# Patient Record
Sex: Female | Born: 1945 | Race: White | Hispanic: No | Smoking: Current every day smoker
Health system: Southern US, Community
[De-identification: ages and names within clinical notes are randomized; demographics above are authoritative.]

## PROBLEM LIST (undated history)

## (undated) DIAGNOSIS — E119 Type 2 diabetes mellitus without complications: Secondary | ICD-10-CM

## (undated) DIAGNOSIS — B977 Papillomavirus as the cause of diseases classified elsewhere: Secondary | ICD-10-CM

## (undated) DIAGNOSIS — E785 Hyperlipidemia, unspecified: Secondary | ICD-10-CM

## (undated) DIAGNOSIS — I739 Peripheral vascular disease, unspecified: Secondary | ICD-10-CM

## (undated) DIAGNOSIS — E1169 Type 2 diabetes mellitus with other specified complication: Secondary | ICD-10-CM

## (undated) DIAGNOSIS — J449 Chronic obstructive pulmonary disease, unspecified: Secondary | ICD-10-CM

## (undated) DIAGNOSIS — K635 Polyp of colon: Secondary | ICD-10-CM

## (undated) DIAGNOSIS — M5106 Intervertebral disc disorders with myelopathy, lumbar region: Secondary | ICD-10-CM

## (undated) DIAGNOSIS — F419 Anxiety disorder, unspecified: Secondary | ICD-10-CM

## (undated) DIAGNOSIS — Z794 Long term (current) use of insulin: Secondary | ICD-10-CM

## (undated) DIAGNOSIS — M797 Fibromyalgia: Secondary | ICD-10-CM

## (undated) DIAGNOSIS — Z85528 Personal history of other malignant neoplasm of kidney: Secondary | ICD-10-CM

## (undated) DIAGNOSIS — K219 Gastro-esophageal reflux disease without esophagitis: Secondary | ICD-10-CM

## (undated) DIAGNOSIS — I10 Essential (primary) hypertension: Secondary | ICD-10-CM

## (undated) DIAGNOSIS — D649 Anemia, unspecified: Secondary | ICD-10-CM

## (undated) DIAGNOSIS — G709 Myoneural disorder, unspecified: Secondary | ICD-10-CM

## (undated) DIAGNOSIS — Z8673 Personal history of transient ischemic attack (TIA), and cerebral infarction without residual deficits: Secondary | ICD-10-CM

## (undated) DIAGNOSIS — R42 Dizziness and giddiness: Secondary | ICD-10-CM

## (undated) DIAGNOSIS — R51 Headache: Secondary | ICD-10-CM

## (undated) DIAGNOSIS — F329 Major depressive disorder, single episode, unspecified: Secondary | ICD-10-CM

## (undated) DIAGNOSIS — I639 Cerebral infarction, unspecified: Secondary | ICD-10-CM

## (undated) DIAGNOSIS — W19XXXA Unspecified fall, initial encounter: Secondary | ICD-10-CM

## (undated) DIAGNOSIS — N289 Disorder of kidney and ureter, unspecified: Secondary | ICD-10-CM

## (undated) DIAGNOSIS — Y92009 Unspecified place in unspecified non-institutional (private) residence as the place of occurrence of the external cause: Secondary | ICD-10-CM

## (undated) DIAGNOSIS — M1990 Unspecified osteoarthritis, unspecified site: Secondary | ICD-10-CM

## (undated) DIAGNOSIS — F32A Depression, unspecified: Secondary | ICD-10-CM

## (undated) DIAGNOSIS — E039 Hypothyroidism, unspecified: Secondary | ICD-10-CM

## (undated) DIAGNOSIS — I2510 Atherosclerotic heart disease of native coronary artery without angina pectoris: Secondary | ICD-10-CM

## (undated) HISTORY — PX: ABDOMINAL HYSTERECTOMY: SHX81

## (undated) HISTORY — DX: Depression, unspecified: F32.A

## (undated) HISTORY — PX: LUNG SURGERY: SHX703

## (undated) HISTORY — PX: CHOLECYSTECTOMY OPEN: SUR202

## (undated) HISTORY — PX: APPENDECTOMY: SHX54

## (undated) HISTORY — PX: BLADDER SUSPENSION: SHX72

## (undated) HISTORY — DX: Anxiety disorder, unspecified: F41.9

## (undated) HISTORY — PX: FOOT SURGERY: SHX648

## (undated) HISTORY — PX: SHOULDER ARTHROSCOPY: SHX128

## (undated) HISTORY — PX: KNEE ARTHROSCOPY: SHX127

## (undated) HISTORY — DX: Hyperlipidemia, unspecified: E78.5

## (undated) HISTORY — PX: CARDIAC CATHETERIZATION: SHX172

## (undated) HISTORY — PX: TUBAL LIGATION: SHX77

## (undated) HISTORY — DX: Polyp of colon: K63.5

## (undated) HISTORY — DX: Major depressive disorder, single episode, unspecified: F32.9

## (undated) HISTORY — DX: Type 2 diabetes mellitus with other specified complication: E11.69

## (undated) HISTORY — DX: Unspecified osteoarthritis, unspecified site: M19.90

## (undated) HISTORY — DX: Unspecified place in unspecified non-institutional (private) residence as the place of occurrence of the external cause: Y92.009

## (undated) HISTORY — DX: Unspecified fall, initial encounter: W19.XXXA

---

## 1987-02-24 DIAGNOSIS — E119 Type 2 diabetes mellitus without complications: Secondary | ICD-10-CM

## 1987-02-24 DIAGNOSIS — Z794 Long term (current) use of insulin: Secondary | ICD-10-CM

## 1987-02-24 HISTORY — DX: Type 2 diabetes mellitus without complications: E11.9

## 1987-02-24 HISTORY — DX: Type 2 diabetes mellitus without complications: Z79.4

## 1998-02-21 ENCOUNTER — Ambulatory Visit (HOSPITAL_COMMUNITY): Admission: RE | Admit: 1998-02-21 | Discharge: 1998-02-21 | Payer: Self-pay | Admitting: Orthopedic Surgery

## 1998-09-24 ENCOUNTER — Inpatient Hospital Stay (HOSPITAL_COMMUNITY): Admission: EM | Admit: 1998-09-24 | Discharge: 1998-10-04 | Payer: Self-pay | Admitting: Emergency Medicine

## 1998-09-25 ENCOUNTER — Encounter: Payer: Self-pay | Admitting: Cardiology

## 1998-09-30 ENCOUNTER — Encounter: Payer: Self-pay | Admitting: Thoracic Surgery

## 1998-10-01 ENCOUNTER — Encounter: Payer: Self-pay | Admitting: Thoracic Surgery

## 1998-10-02 ENCOUNTER — Encounter: Payer: Self-pay | Admitting: Thoracic Surgery

## 1998-10-03 ENCOUNTER — Encounter: Payer: Self-pay | Admitting: Thoracic Surgery

## 1998-12-23 ENCOUNTER — Encounter: Admission: RE | Admit: 1998-12-23 | Discharge: 1998-12-23 | Payer: Self-pay | Admitting: Thoracic Surgery

## 1998-12-23 ENCOUNTER — Encounter: Payer: Self-pay | Admitting: Thoracic Surgery

## 1999-02-26 ENCOUNTER — Encounter: Payer: Self-pay | Admitting: Thoracic Surgery

## 1999-02-26 ENCOUNTER — Encounter: Admission: RE | Admit: 1999-02-26 | Discharge: 1999-02-26 | Payer: Self-pay | Admitting: Thoracic Surgery

## 1999-06-11 ENCOUNTER — Ambulatory Visit (HOSPITAL_BASED_OUTPATIENT_CLINIC_OR_DEPARTMENT_OTHER): Admission: RE | Admit: 1999-06-11 | Discharge: 1999-06-11 | Payer: Self-pay | Admitting: Orthopedic Surgery

## 2000-06-30 ENCOUNTER — Encounter (INDEPENDENT_AMBULATORY_CARE_PROVIDER_SITE_OTHER): Payer: Self-pay | Admitting: *Deleted

## 2000-06-30 ENCOUNTER — Inpatient Hospital Stay (HOSPITAL_COMMUNITY): Admission: EM | Admit: 2000-06-30 | Discharge: 2000-07-02 | Payer: Self-pay | Admitting: Emergency Medicine

## 2000-06-30 ENCOUNTER — Encounter: Payer: Self-pay | Admitting: Emergency Medicine

## 2000-07-01 ENCOUNTER — Encounter: Payer: Self-pay | Admitting: Sports Medicine

## 2000-07-02 ENCOUNTER — Encounter: Payer: Self-pay | Admitting: *Deleted

## 2000-07-08 ENCOUNTER — Encounter: Admission: RE | Admit: 2000-07-08 | Discharge: 2000-07-08 | Payer: Self-pay | Admitting: Sports Medicine

## 2000-07-16 ENCOUNTER — Ambulatory Visit (HOSPITAL_COMMUNITY): Admission: RE | Admit: 2000-07-16 | Discharge: 2000-07-16 | Payer: Self-pay | Admitting: Sports Medicine

## 2000-07-16 ENCOUNTER — Encounter: Payer: Self-pay | Admitting: Sports Medicine

## 2001-12-14 ENCOUNTER — Inpatient Hospital Stay (HOSPITAL_COMMUNITY): Admission: EM | Admit: 2001-12-14 | Discharge: 2001-12-14 | Payer: Self-pay | Admitting: Emergency Medicine

## 2001-12-14 ENCOUNTER — Encounter: Payer: Self-pay | Admitting: Emergency Medicine

## 2001-12-21 ENCOUNTER — Encounter: Admission: RE | Admit: 2001-12-21 | Discharge: 2001-12-21 | Payer: Self-pay | Admitting: Family Medicine

## 2001-12-21 ENCOUNTER — Encounter: Payer: Self-pay | Admitting: Family Medicine

## 2002-01-05 ENCOUNTER — Encounter: Payer: Self-pay | Admitting: *Deleted

## 2002-01-05 ENCOUNTER — Ambulatory Visit (HOSPITAL_COMMUNITY): Admission: RE | Admit: 2002-01-05 | Discharge: 2002-01-05 | Payer: Self-pay | Admitting: *Deleted

## 2002-10-04 ENCOUNTER — Ambulatory Visit (HOSPITAL_BASED_OUTPATIENT_CLINIC_OR_DEPARTMENT_OTHER): Admission: RE | Admit: 2002-10-04 | Discharge: 2002-10-04 | Payer: Self-pay | Admitting: Orthopedic Surgery

## 2002-10-12 ENCOUNTER — Encounter: Payer: Self-pay | Admitting: Emergency Medicine

## 2002-10-12 ENCOUNTER — Emergency Department (HOSPITAL_COMMUNITY): Admission: EM | Admit: 2002-10-12 | Discharge: 2002-10-12 | Payer: Self-pay | Admitting: Emergency Medicine

## 2002-11-12 ENCOUNTER — Emergency Department (HOSPITAL_COMMUNITY): Admission: EM | Admit: 2002-11-12 | Discharge: 2002-11-12 | Payer: Self-pay | Admitting: Emergency Medicine

## 2002-11-12 ENCOUNTER — Encounter: Payer: Self-pay | Admitting: Emergency Medicine

## 2003-07-25 ENCOUNTER — Ambulatory Visit (HOSPITAL_COMMUNITY): Admission: RE | Admit: 2003-07-25 | Discharge: 2003-07-25 | Payer: Self-pay | Admitting: *Deleted

## 2003-11-08 ENCOUNTER — Emergency Department (HOSPITAL_COMMUNITY): Admission: EM | Admit: 2003-11-08 | Discharge: 2003-11-08 | Payer: Self-pay | Admitting: Emergency Medicine

## 2004-04-02 ENCOUNTER — Ambulatory Visit: Admission: RE | Admit: 2004-04-02 | Discharge: 2004-04-02 | Payer: Self-pay | Admitting: Family Medicine

## 2004-09-26 ENCOUNTER — Emergency Department (HOSPITAL_COMMUNITY): Admission: EM | Admit: 2004-09-26 | Discharge: 2004-09-26 | Payer: Self-pay | Admitting: Emergency Medicine

## 2004-09-30 ENCOUNTER — Emergency Department (HOSPITAL_COMMUNITY): Admission: EM | Admit: 2004-09-30 | Discharge: 2004-09-30 | Payer: Self-pay | Admitting: Family Medicine

## 2004-10-06 ENCOUNTER — Emergency Department (HOSPITAL_COMMUNITY): Admission: EM | Admit: 2004-10-06 | Discharge: 2004-10-06 | Payer: Self-pay | Admitting: Family Medicine

## 2004-10-29 ENCOUNTER — Ambulatory Visit: Payer: Self-pay | Admitting: Family Medicine

## 2004-11-05 ENCOUNTER — Ambulatory Visit: Payer: Self-pay | Admitting: Family Medicine

## 2004-11-05 ENCOUNTER — Ambulatory Visit (HOSPITAL_COMMUNITY): Admission: RE | Admit: 2004-11-05 | Discharge: 2004-11-05 | Payer: Self-pay | Admitting: Internal Medicine

## 2004-11-12 ENCOUNTER — Ambulatory Visit: Payer: Self-pay | Admitting: Family Medicine

## 2004-12-03 ENCOUNTER — Ambulatory Visit: Payer: Self-pay | Admitting: Family Medicine

## 2005-02-02 ENCOUNTER — Ambulatory Visit: Payer: Self-pay | Admitting: Family Medicine

## 2005-03-05 ENCOUNTER — Ambulatory Visit: Payer: Self-pay | Admitting: Family Medicine

## 2005-04-09 ENCOUNTER — Ambulatory Visit: Payer: Self-pay | Admitting: Family Medicine

## 2005-04-12 ENCOUNTER — Ambulatory Visit (HOSPITAL_COMMUNITY): Admission: RE | Admit: 2005-04-12 | Discharge: 2005-04-12 | Payer: Self-pay | Admitting: Family Medicine

## 2005-05-04 ENCOUNTER — Ambulatory Visit: Payer: Self-pay | Admitting: Family Medicine

## 2005-06-04 ENCOUNTER — Ambulatory Visit: Payer: Self-pay | Admitting: Family Medicine

## 2005-06-25 ENCOUNTER — Ambulatory Visit: Payer: Self-pay | Admitting: Family Medicine

## 2005-07-22 ENCOUNTER — Ambulatory Visit: Payer: Self-pay | Admitting: Family Medicine

## 2005-07-30 ENCOUNTER — Ambulatory Visit: Payer: Self-pay | Admitting: Family Medicine

## 2005-10-02 ENCOUNTER — Emergency Department (HOSPITAL_COMMUNITY): Admission: EM | Admit: 2005-10-02 | Discharge: 2005-10-02 | Payer: Self-pay | Admitting: Family Medicine

## 2005-10-23 ENCOUNTER — Ambulatory Visit: Payer: Self-pay | Admitting: Family Medicine

## 2005-11-17 ENCOUNTER — Ambulatory Visit: Payer: Self-pay | Admitting: Family Medicine

## 2005-11-20 ENCOUNTER — Encounter (INDEPENDENT_AMBULATORY_CARE_PROVIDER_SITE_OTHER): Payer: Self-pay | Admitting: *Deleted

## 2005-11-20 ENCOUNTER — Ambulatory Visit (HOSPITAL_COMMUNITY): Admission: RE | Admit: 2005-11-20 | Discharge: 2005-11-20 | Payer: Self-pay | Admitting: Family Medicine

## 2005-11-30 ENCOUNTER — Encounter (INDEPENDENT_AMBULATORY_CARE_PROVIDER_SITE_OTHER): Payer: Self-pay | Admitting: *Deleted

## 2005-11-30 ENCOUNTER — Ambulatory Visit: Payer: Self-pay | Admitting: Family Medicine

## 2005-12-08 ENCOUNTER — Ambulatory Visit: Payer: Self-pay | Admitting: Family Medicine

## 2005-12-10 ENCOUNTER — Ambulatory Visit (HOSPITAL_COMMUNITY): Admission: RE | Admit: 2005-12-10 | Discharge: 2005-12-10 | Payer: Self-pay | Admitting: Family Medicine

## 2005-12-22 ENCOUNTER — Other Ambulatory Visit: Admission: RE | Admit: 2005-12-22 | Discharge: 2005-12-22 | Payer: Self-pay | Admitting: Obstetrics and Gynecology

## 2005-12-31 ENCOUNTER — Ambulatory Visit: Payer: Self-pay | Admitting: Family Medicine

## 2006-01-20 ENCOUNTER — Ambulatory Visit: Payer: Self-pay | Admitting: Family Medicine

## 2006-03-13 ENCOUNTER — Emergency Department (HOSPITAL_COMMUNITY): Admission: EM | Admit: 2006-03-13 | Discharge: 2006-03-13 | Payer: Self-pay | Admitting: Family Medicine

## 2006-03-29 ENCOUNTER — Ambulatory Visit: Payer: Self-pay | Admitting: Family Medicine

## 2006-04-29 ENCOUNTER — Inpatient Hospital Stay (HOSPITAL_COMMUNITY): Admission: RE | Admit: 2006-04-29 | Discharge: 2006-05-02 | Payer: Self-pay | Admitting: Obstetrics and Gynecology

## 2006-04-29 ENCOUNTER — Encounter (INDEPENDENT_AMBULATORY_CARE_PROVIDER_SITE_OTHER): Payer: Self-pay | Admitting: Specialist

## 2006-05-09 ENCOUNTER — Encounter: Admission: RE | Admit: 2006-05-09 | Discharge: 2006-05-09 | Payer: Self-pay | Admitting: Obstetrics and Gynecology

## 2006-05-31 ENCOUNTER — Ambulatory Visit: Payer: Self-pay | Admitting: Family Medicine

## 2006-06-17 ENCOUNTER — Ambulatory Visit: Payer: Self-pay | Admitting: Cardiovascular Disease

## 2006-06-28 ENCOUNTER — Ambulatory Visit: Payer: Self-pay | Admitting: Family Medicine

## 2006-07-11 ENCOUNTER — Emergency Department (HOSPITAL_COMMUNITY): Admission: EM | Admit: 2006-07-11 | Discharge: 2006-07-11 | Payer: Self-pay | Admitting: Family Medicine

## 2006-07-20 DIAGNOSIS — K219 Gastro-esophageal reflux disease without esophagitis: Secondary | ICD-10-CM

## 2006-07-20 DIAGNOSIS — F411 Generalized anxiety disorder: Secondary | ICD-10-CM | POA: Insufficient documentation

## 2006-07-20 DIAGNOSIS — Z86718 Personal history of other venous thrombosis and embolism: Secondary | ICD-10-CM

## 2006-07-20 HISTORY — DX: Gastro-esophageal reflux disease without esophagitis: K21.9

## 2006-07-27 ENCOUNTER — Emergency Department (HOSPITAL_COMMUNITY): Admission: EM | Admit: 2006-07-27 | Discharge: 2006-07-27 | Payer: Self-pay | Admitting: Emergency Medicine

## 2006-07-29 ENCOUNTER — Ambulatory Visit: Payer: Self-pay | Admitting: Family Medicine

## 2006-08-04 DIAGNOSIS — E119 Type 2 diabetes mellitus without complications: Secondary | ICD-10-CM

## 2006-08-04 DIAGNOSIS — E039 Hypothyroidism, unspecified: Secondary | ICD-10-CM

## 2006-08-04 DIAGNOSIS — M5106 Intervertebral disc disorders with myelopathy, lumbar region: Secondary | ICD-10-CM | POA: Insufficient documentation

## 2006-08-04 DIAGNOSIS — F17200 Nicotine dependence, unspecified, uncomplicated: Secondary | ICD-10-CM

## 2006-08-04 DIAGNOSIS — Z794 Long term (current) use of insulin: Secondary | ICD-10-CM

## 2006-08-24 HISTORY — PX: OTHER SURGICAL HISTORY: SHX169

## 2006-08-30 ENCOUNTER — Encounter: Payer: Self-pay | Admitting: Urology

## 2006-08-30 ENCOUNTER — Ambulatory Visit (HOSPITAL_COMMUNITY): Admission: RE | Admit: 2006-08-30 | Discharge: 2006-09-01 | Payer: Self-pay | Admitting: Urology

## 2006-09-28 ENCOUNTER — Ambulatory Visit: Payer: Self-pay | Admitting: Family Medicine

## 2006-10-26 ENCOUNTER — Ambulatory Visit: Payer: Self-pay | Admitting: Family Medicine

## 2006-11-03 ENCOUNTER — Ambulatory Visit: Payer: Self-pay | Admitting: Internal Medicine

## 2006-11-25 ENCOUNTER — Ambulatory Visit: Payer: Self-pay | Admitting: Family Medicine

## 2006-11-25 LAB — CONVERTED CEMR LAB
Basophils Absolute: 0.1 10*3/uL (ref 0.0–0.1)
Basophils Relative: 1 % (ref 0–1)
HDL: 44 mg/dL (ref >39)
Hemoglobin: 13.5 g/dL (ref 12.0–15.0)
MCHC: 31.7 g/dL (ref 30.0–36.0)
Monocytes Absolute: 0.4 10*3/uL (ref 0.2–0.7)
Neutro Abs: 3.6 10*3/uL (ref 1.7–7.7)
RDW: 20.3 % — ABNORMAL HIGH (ref 11.5–14.0)
TIBC: 360 ug/dL (ref 250–470)
UIBC: 238 ug/dL

## 2006-12-31 ENCOUNTER — Emergency Department (HOSPITAL_COMMUNITY): Admission: EM | Admit: 2006-12-31 | Discharge: 2006-12-31 | Payer: Self-pay | Admitting: Family Medicine

## 2007-01-04 ENCOUNTER — Ambulatory Visit: Payer: Self-pay | Admitting: Family Medicine

## 2007-01-17 ENCOUNTER — Ambulatory Visit: Payer: Self-pay | Admitting: Family Medicine

## 2007-01-28 ENCOUNTER — Ambulatory Visit: Payer: Self-pay | Admitting: Family Medicine

## 2007-02-28 ENCOUNTER — Ambulatory Visit: Payer: Self-pay | Admitting: Internal Medicine

## 2007-03-10 ENCOUNTER — Ambulatory Visit: Payer: Self-pay | Admitting: Internal Medicine

## 2007-03-12 ENCOUNTER — Emergency Department (HOSPITAL_COMMUNITY): Admission: EM | Admit: 2007-03-12 | Discharge: 2007-03-12 | Payer: Self-pay | Admitting: Family Medicine

## 2007-03-21 ENCOUNTER — Ambulatory Visit: Payer: Self-pay | Admitting: Family Medicine

## 2007-06-07 ENCOUNTER — Ambulatory Visit: Payer: Self-pay | Admitting: Family Medicine

## 2007-06-28 ENCOUNTER — Ambulatory Visit: Payer: Self-pay | Admitting: Family Medicine

## 2007-06-28 LAB — CONVERTED CEMR LAB
Amphetamine Screen, Ur: NEGATIVE
Barbiturate Quant, Ur: NEGATIVE
Cholesterol: 158 mg/dL (ref 0–200)
Cocaine Metabolites: NEGATIVE
Magnesium: 2 mg/dL (ref 1.5–2.5)
Marijuana Metabolite: NEGATIVE
Opiate Screen, Urine: NEGATIVE
Total CHOL/HDL Ratio: 3.8
Triglycerides: 359 mg/dL — ABNORMAL HIGH (ref <150)
VLDL: 72 mg/dL — ABNORMAL HIGH (ref 0–40)

## 2007-07-05 ENCOUNTER — Emergency Department (HOSPITAL_COMMUNITY): Admission: EM | Admit: 2007-07-05 | Discharge: 2007-07-05 | Payer: Self-pay | Admitting: Emergency Medicine

## 2007-08-15 ENCOUNTER — Ambulatory Visit: Payer: Self-pay | Admitting: Internal Medicine

## 2007-08-15 LAB — CONVERTED CEMR LAB
AST: 17 units/L (ref 0–37)
Alkaline Phosphatase: 124 units/L — ABNORMAL HIGH (ref 39–117)
BUN: 11 mg/dL (ref 6–23)
Basophils Relative: 1 % (ref 0–1)
Calcium: 8.6 mg/dL (ref 8.4–10.5)
Creatinine, Ser: 0.73 mg/dL (ref 0.40–1.20)
Eosinophils Absolute: 0.1 10*3/uL (ref 0.0–0.7)
Glucose, Bld: 247 mg/dL — ABNORMAL HIGH (ref 70–99)
HCT: 43.2 % (ref 36.0–46.0)
Hemoglobin: 14 g/dL (ref 12.0–15.0)
MCHC: 32.4 g/dL (ref 30.0–36.0)
MCV: 83.2 fL (ref 78.0–100.0)
Monocytes Absolute: 0.3 10*3/uL (ref 0.1–1.0)
Monocytes Relative: 5 % (ref 3–12)
RBC: 5.19 M/uL — ABNORMAL HIGH (ref 3.87–5.11)

## 2007-09-07 ENCOUNTER — Emergency Department (HOSPITAL_COMMUNITY): Admission: EM | Admit: 2007-09-07 | Discharge: 2007-09-07 | Payer: Self-pay | Admitting: Emergency Medicine

## 2007-09-27 ENCOUNTER — Ambulatory Visit: Payer: Self-pay | Admitting: Internal Medicine

## 2007-09-27 ENCOUNTER — Encounter (INDEPENDENT_AMBULATORY_CARE_PROVIDER_SITE_OTHER): Payer: Self-pay | Admitting: Family Medicine

## 2007-09-27 LAB — CONVERTED CEMR LAB: Vit D, 1,25-Dihydroxy: 47 (ref 30–89)

## 2007-10-04 ENCOUNTER — Emergency Department (HOSPITAL_COMMUNITY): Admission: EM | Admit: 2007-10-04 | Discharge: 2007-10-04 | Payer: Self-pay | Admitting: Emergency Medicine

## 2007-10-23 ENCOUNTER — Ambulatory Visit: Payer: Self-pay | Admitting: Cardiology

## 2007-10-23 ENCOUNTER — Inpatient Hospital Stay (HOSPITAL_COMMUNITY): Admission: EM | Admit: 2007-10-23 | Discharge: 2007-10-25 | Payer: Self-pay | Admitting: Emergency Medicine

## 2007-10-24 ENCOUNTER — Encounter (INDEPENDENT_AMBULATORY_CARE_PROVIDER_SITE_OTHER): Payer: Self-pay | Admitting: Internal Medicine

## 2007-10-25 ENCOUNTER — Encounter: Payer: Self-pay | Admitting: Cardiology

## 2007-11-03 ENCOUNTER — Ambulatory Visit: Payer: Self-pay | Admitting: Family Medicine

## 2007-11-25 ENCOUNTER — Ambulatory Visit: Payer: Self-pay | Admitting: Cardiovascular Disease

## 2008-01-04 ENCOUNTER — Encounter: Admission: RE | Admit: 2008-01-04 | Discharge: 2008-01-04 | Payer: Self-pay | Admitting: Internal Medicine

## 2008-01-05 ENCOUNTER — Emergency Department (HOSPITAL_COMMUNITY): Admission: EM | Admit: 2008-01-05 | Discharge: 2008-01-05 | Payer: Self-pay | Admitting: Emergency Medicine

## 2008-02-23 ENCOUNTER — Encounter (INDEPENDENT_AMBULATORY_CARE_PROVIDER_SITE_OTHER): Payer: Self-pay | Admitting: Family Medicine

## 2008-02-23 ENCOUNTER — Ambulatory Visit: Payer: Self-pay | Admitting: Internal Medicine

## 2008-02-23 LAB — CONVERTED CEMR LAB
ALT: 25 units/L (ref 0–35)
Albumin: 4.2 g/dL (ref 3.5–5.2)
Basophils Absolute: 0.1 10*3/uL (ref 0.0–0.1)
CO2: 21 meq/L (ref 19–32)
Calcium: 9.1 mg/dL (ref 8.4–10.5)
Chloride: 106 meq/L (ref 96–112)
Cholesterol: 144 mg/dL (ref 0–200)
Eosinophils Relative: 2 % (ref 0–5)
Free T4: 1.16 ng/dL (ref 0.89–1.80)
Glucose, Bld: 71 mg/dL (ref 70–99)
HCT: 46 % (ref 36.0–46.0)
Lymphocytes Relative: 33 % (ref 12–46)
Lymphs Abs: 2.4 10*3/uL (ref 0.7–4.0)
Neutro Abs: 4.1 10*3/uL (ref 1.7–7.7)
Platelets: 306 10*3/uL (ref 150–400)
Potassium: 4.4 meq/L (ref 3.5–5.3)
Sodium: 141 meq/L (ref 135–145)
Total Bilirubin: 0.4 mg/dL (ref 0.3–1.2)
Total Protein: 7.9 g/dL (ref 6.0–8.3)
Triglycerides: 149 mg/dL (ref <150)
Vit D, 1,25-Dihydroxy: 32 (ref 30–89)
WBC: 7.3 10*3/uL (ref 4.0–10.5)

## 2008-04-27 ENCOUNTER — Emergency Department (HOSPITAL_COMMUNITY): Admission: EM | Admit: 2008-04-27 | Discharge: 2008-04-27 | Payer: Self-pay | Admitting: Emergency Medicine

## 2008-04-29 ENCOUNTER — Emergency Department (HOSPITAL_COMMUNITY): Admission: EM | Admit: 2008-04-29 | Discharge: 2008-04-29 | Payer: Self-pay | Admitting: Emergency Medicine

## 2008-05-22 ENCOUNTER — Ambulatory Visit: Payer: Self-pay | Admitting: Family Medicine

## 2008-05-29 ENCOUNTER — Ambulatory Visit: Payer: Self-pay | Admitting: Vascular Surgery

## 2008-05-29 ENCOUNTER — Encounter (INDEPENDENT_AMBULATORY_CARE_PROVIDER_SITE_OTHER): Payer: Self-pay | Admitting: Family Medicine

## 2008-05-29 ENCOUNTER — Ambulatory Visit (HOSPITAL_COMMUNITY): Admission: RE | Admit: 2008-05-29 | Discharge: 2008-05-29 | Payer: Self-pay | Admitting: Family Medicine

## 2008-06-05 ENCOUNTER — Ambulatory Visit: Payer: Self-pay | Admitting: Family Medicine

## 2008-06-27 ENCOUNTER — Ambulatory Visit: Payer: Self-pay | Admitting: Vascular Surgery

## 2008-08-03 ENCOUNTER — Ambulatory Visit: Payer: Self-pay | Admitting: Internal Medicine

## 2008-08-13 ENCOUNTER — Encounter (INDEPENDENT_AMBULATORY_CARE_PROVIDER_SITE_OTHER): Payer: Self-pay | Admitting: *Deleted

## 2008-08-13 ENCOUNTER — Ambulatory Visit: Payer: Self-pay | Admitting: Gastroenterology

## 2008-08-13 DIAGNOSIS — Z860100 Personal history of colon polyps, unspecified: Secondary | ICD-10-CM | POA: Insufficient documentation

## 2008-08-13 DIAGNOSIS — R12 Heartburn: Secondary | ICD-10-CM

## 2008-08-13 DIAGNOSIS — K625 Hemorrhage of anus and rectum: Secondary | ICD-10-CM | POA: Insufficient documentation

## 2008-08-20 ENCOUNTER — Telehealth: Payer: Self-pay | Admitting: Gastroenterology

## 2008-08-20 ENCOUNTER — Telehealth (INDEPENDENT_AMBULATORY_CARE_PROVIDER_SITE_OTHER): Payer: Self-pay | Admitting: *Deleted

## 2008-08-28 ENCOUNTER — Encounter (INDEPENDENT_AMBULATORY_CARE_PROVIDER_SITE_OTHER): Payer: Self-pay | Admitting: *Deleted

## 2008-09-12 ENCOUNTER — Ambulatory Visit: Payer: Self-pay | Admitting: Family Medicine

## 2008-09-12 ENCOUNTER — Encounter (INDEPENDENT_AMBULATORY_CARE_PROVIDER_SITE_OTHER): Payer: Self-pay | Admitting: Internal Medicine

## 2008-09-12 LAB — CONVERTED CEMR LAB
Benzodiazepines.: POSITIVE — AB
Cocaine Metabolites: NEGATIVE
Creatinine,U: 86.1 mg/dL
Free T4: 1.58 ng/dL (ref 0.80–1.80)
Opiate Screen, Urine: POSITIVE — AB
Phencyclidine (PCP): NEGATIVE
Propoxyphene: NEGATIVE
TSH: 0.405 microintl units/mL (ref 0.350–4.500)

## 2008-09-21 ENCOUNTER — Emergency Department (HOSPITAL_COMMUNITY): Admission: EM | Admit: 2008-09-21 | Discharge: 2008-09-21 | Payer: Self-pay | Admitting: Family Medicine

## 2008-10-05 ENCOUNTER — Ambulatory Visit: Payer: Self-pay | Admitting: Family Medicine

## 2008-10-23 ENCOUNTER — Ambulatory Visit: Payer: Self-pay | Admitting: Family Medicine

## 2008-10-23 ENCOUNTER — Ambulatory Visit (HOSPITAL_COMMUNITY): Admission: RE | Admit: 2008-10-23 | Discharge: 2008-10-23 | Payer: Self-pay | Admitting: Family Medicine

## 2008-11-01 ENCOUNTER — Encounter (INDEPENDENT_AMBULATORY_CARE_PROVIDER_SITE_OTHER): Payer: Self-pay | Admitting: *Deleted

## 2008-11-20 ENCOUNTER — Telehealth (INDEPENDENT_AMBULATORY_CARE_PROVIDER_SITE_OTHER): Payer: Self-pay | Admitting: *Deleted

## 2008-11-22 ENCOUNTER — Ambulatory Visit: Payer: Self-pay | Admitting: Family Medicine

## 2008-11-22 LAB — CONVERTED CEMR LAB: Prothrombin Time: 12.7 s (ref 11.6–15.2)

## 2008-11-23 ENCOUNTER — Encounter (INDEPENDENT_AMBULATORY_CARE_PROVIDER_SITE_OTHER): Payer: Self-pay | Admitting: Family Medicine

## 2008-11-23 LAB — CONVERTED CEMR LAB
Basophils Absolute: 0.1 10*3/uL (ref 0.0–0.1)
Eosinophils Absolute: 0.1 10*3/uL (ref 0.0–0.7)
Eosinophils Relative: 1 % (ref 0–5)
HCT: 42.1 % (ref 36.0–46.0)
Lymphocytes Relative: 26 % (ref 12–46)
Lymphs Abs: 3.5 10*3/uL (ref 0.7–4.0)
Neutrophils Relative %: 68 % (ref 43–77)
Platelets: 363 10*3/uL (ref 150–400)
RDW: 14.2 % (ref 11.5–15.5)
Vit D, 25-Hydroxy: 35 ng/mL (ref 30–89)
WBC: 13.2 10*3/uL — ABNORMAL HIGH (ref 4.0–10.5)

## 2008-12-14 DIAGNOSIS — J4489 Other specified chronic obstructive pulmonary disease: Secondary | ICD-10-CM

## 2008-12-14 DIAGNOSIS — J449 Chronic obstructive pulmonary disease, unspecified: Secondary | ICD-10-CM

## 2008-12-14 DIAGNOSIS — D649 Anemia, unspecified: Secondary | ICD-10-CM | POA: Insufficient documentation

## 2008-12-14 DIAGNOSIS — I10 Essential (primary) hypertension: Secondary | ICD-10-CM

## 2008-12-14 DIAGNOSIS — C539 Malignant neoplasm of cervix uteri, unspecified: Secondary | ICD-10-CM | POA: Insufficient documentation

## 2008-12-14 DIAGNOSIS — Z85528 Personal history of other malignant neoplasm of kidney: Secondary | ICD-10-CM

## 2008-12-14 DIAGNOSIS — I219 Acute myocardial infarction, unspecified: Secondary | ICD-10-CM | POA: Insufficient documentation

## 2008-12-14 DIAGNOSIS — K589 Irritable bowel syndrome without diarrhea: Secondary | ICD-10-CM

## 2008-12-14 HISTORY — DX: Personal history of other malignant neoplasm of kidney: Z85.528

## 2008-12-14 HISTORY — DX: Other specified chronic obstructive pulmonary disease: J44.89

## 2008-12-14 HISTORY — DX: Chronic obstructive pulmonary disease, unspecified: J44.9

## 2008-12-18 ENCOUNTER — Encounter: Payer: Self-pay | Admitting: Cardiovascular Disease

## 2008-12-19 ENCOUNTER — Ambulatory Visit: Payer: Self-pay | Admitting: Cardiovascular Disease

## 2008-12-19 DIAGNOSIS — I739 Peripheral vascular disease, unspecified: Secondary | ICD-10-CM

## 2008-12-20 ENCOUNTER — Telehealth (INDEPENDENT_AMBULATORY_CARE_PROVIDER_SITE_OTHER): Payer: Self-pay | Admitting: Radiology

## 2008-12-24 ENCOUNTER — Encounter (HOSPITAL_COMMUNITY): Admission: RE | Admit: 2008-12-24 | Discharge: 2009-02-21 | Payer: Self-pay | Admitting: Cardiovascular Disease

## 2008-12-24 ENCOUNTER — Ambulatory Visit: Payer: Self-pay

## 2008-12-24 ENCOUNTER — Ambulatory Visit: Payer: Self-pay | Admitting: Internal Medicine

## 2008-12-26 ENCOUNTER — Ambulatory Visit: Payer: Self-pay | Admitting: Vascular Surgery

## 2008-12-31 ENCOUNTER — Ambulatory Visit: Payer: Self-pay | Admitting: Family Medicine

## 2008-12-31 ENCOUNTER — Encounter (INDEPENDENT_AMBULATORY_CARE_PROVIDER_SITE_OTHER): Payer: Self-pay | Admitting: Family Medicine

## 2008-12-31 LAB — CONVERTED CEMR LAB
BUN: 12 mg/dL (ref 6–23)
Bilirubin, Direct: 0.1 mg/dL (ref 0.0–0.3)
Creatinine, Ser: 0.77 mg/dL (ref 0.40–1.20)
Indirect Bilirubin: 0.2 mg/dL (ref 0.0–0.9)
Potassium: 4.2 meq/L (ref 3.5–5.3)
Total Bilirubin: 0.3 mg/dL (ref 0.3–1.2)

## 2009-02-08 ENCOUNTER — Emergency Department (HOSPITAL_COMMUNITY): Admission: EM | Admit: 2009-02-08 | Discharge: 2009-02-08 | Payer: Self-pay | Admitting: Family Medicine

## 2009-04-25 ENCOUNTER — Encounter (INDEPENDENT_AMBULATORY_CARE_PROVIDER_SITE_OTHER): Payer: Self-pay | Admitting: *Deleted

## 2009-05-15 ENCOUNTER — Emergency Department (HOSPITAL_COMMUNITY): Admission: EM | Admit: 2009-05-15 | Discharge: 2009-05-15 | Payer: Self-pay | Admitting: Family Medicine

## 2009-06-17 ENCOUNTER — Encounter: Admission: RE | Admit: 2009-06-17 | Discharge: 2009-06-17 | Payer: Self-pay | Admitting: Orthopedic Surgery

## 2009-06-18 ENCOUNTER — Ambulatory Visit (HOSPITAL_BASED_OUTPATIENT_CLINIC_OR_DEPARTMENT_OTHER): Admission: RE | Admit: 2009-06-18 | Discharge: 2009-06-18 | Payer: Self-pay | Admitting: Orthopedic Surgery

## 2009-06-24 ENCOUNTER — Emergency Department (HOSPITAL_COMMUNITY): Admission: EM | Admit: 2009-06-24 | Discharge: 2009-06-24 | Payer: Self-pay | Admitting: Family Medicine

## 2009-06-26 ENCOUNTER — Ambulatory Visit: Payer: Self-pay | Admitting: Vascular Surgery

## 2009-07-12 ENCOUNTER — Ambulatory Visit: Payer: Self-pay | Admitting: Family Medicine

## 2009-07-19 ENCOUNTER — Emergency Department (HOSPITAL_COMMUNITY): Admission: EM | Admit: 2009-07-19 | Discharge: 2009-07-19 | Payer: Self-pay | Admitting: Family Medicine

## 2010-01-22 ENCOUNTER — Ambulatory Visit: Payer: Self-pay | Admitting: Vascular Surgery

## 2010-02-06 ENCOUNTER — Ambulatory Visit: Payer: Self-pay | Admitting: Vascular Surgery

## 2010-02-06 ENCOUNTER — Encounter: Payer: Self-pay | Admitting: Cardiovascular Disease

## 2010-03-16 ENCOUNTER — Encounter: Payer: Self-pay | Admitting: Internal Medicine

## 2010-03-25 NOTE — Letter (Signed)
Summary: Appointment - Reminder 2  [employer], Main Office  [address]   [place], [place] [address]   Phone: [phone]  Fax: [phone]     April 25, 2009 MRN: [id number]   CADDIE RANDLE [address] [place], [place]  [address]   Dear Ms. ITEN,  Our records indicate that it is time to schedule a follow-up appointment with Dr. Eden Emms. It is very important that we reach you to schedule this appointment. We look forward to participating in your health care needs. Please contact us at the number listed above at your earliest convenience to schedule your appointment.  If you are unable to make an appointment at this time, give [place] a call so we can update our records.     Sincerely,   Migdalia Dk [facility] Scheduling Team

## 2010-03-27 NOTE — Letter (Signed)
Summary: [facility] - Office Visit  [facility] - Office Visit   Imported By: Marylou Mccoy 03/03/2010 16:50:07  _____________________________________________________________________  External Attachment:    Type:   Image     Comment:   External Document

## 2010-05-08 ENCOUNTER — Ambulatory Visit (INDEPENDENT_AMBULATORY_CARE_PROVIDER_SITE_OTHER): Payer: Medicare Other | Admitting: Vascular Surgery

## 2010-05-08 DIAGNOSIS — I70219 Atherosclerosis of native arteries of extremities with intermittent claudication, unspecified extremity: Secondary | ICD-10-CM

## 2010-05-09 NOTE — Assessment & Plan Note (Signed)
OFFICE VISIT  April, Branch DOB:  1945-03-29                                       05/08/2010 [id number]#:[id number]  The patient returns for followup today.  She was last seen in December of 2011.  She was seen at that time for lower extremity claudication symptoms.  At that time she had several social issues going on in her life and deferred any treatment at that time.  She now returns for further followup.  She continues to describe pain when walking in both legs.  This occurs primarily in the calf.  This occurs after walking one half block.  She denies any rest pain.  She has had no ulcers on the feet.  Unfortunately she continues to smoke but has cut back considerably and is now smoking 2 cigarettes per day.  A lengthy discussion was held with the patient today regarding smoking cessation.  CHRONIC MEDICAL PROBLEMS:  Continue to remain diabetes, elevated cholesterol.  REVIEW OF SYSTEMS:  She denies any shortness of breath or chest pain. She still has some depression and anxiety related to a recent death in the family.  PHYSICAL EXAM:  Vital signs:  Blood pressure is 139/84 in the left arm, heart rate is 87 and regular, respirations 16.  HEENT:  Unremarkable. Neck:  Has 2+ carotid pulses.  Chest:  Clear to auscultation.  Cardiac: Regular rate and rhythm without murmur.  Abdomen:  Soft, nontender, nondistended.  No mass.  Extremities:  She has 2+ radial, 2+ femoral pulses bilaterally.  She has a 1+ right dorsalis pedis pulse.  She has absent popliteal and pedal pulses in the left leg.  Skin:  Has no open ulcers or rashes.  Previous ABIs performed on 01/22/2010 were reviewed.  They were 0.7 on the left and 1.06 on the right.  I explained to the patient that her perfusion to her right lower extremity does not really explain the right leg symptoms but some of her left leg symptoms may be explained by arterial occlusive disease.  In light of this I  believe that she needs an arteriogram to fully evaluate the left lower extremity and we would consider angioplasty and stenting of the left lower extremity for her symptoms.  The risks, benefits, possible complications and procedure details including but not limited to bleeding, infection, vessel injury, contrast reaction were explained to the patient today and she understands and agrees to proceed.  Her procedure is scheduled for 05/16/2010.    April Hora. Dalisha Shively, MD Electronically Signed  CEF/MEDQ  D:  05/09/2010  T:  05/09/2010  Job:  [id number]  cc:   April Pick. Eden Emms, MD, [facility] April Branch, M.D.

## 2010-05-13 LAB — GLUCOSE, CAPILLARY: Glucose-Capillary: 239 mg/dL — ABNORMAL HIGH (ref 70–99)

## 2010-05-13 LAB — BASIC METABOLIC PANEL
CO2: 23 mEq/L (ref 19–32)
Chloride: 105 mEq/L (ref 96–112)
GFR calc Af Amer: >60 mL/min (ref >60)
Glucose, Bld: 285 mg/dL — ABNORMAL HIGH (ref 70–99)
Potassium: 4 mEq/L (ref 3.5–5.1)
Sodium: 134 mEq/L — ABNORMAL LOW (ref 135–145)

## 2010-05-13 LAB — POCT HEMOGLOBIN-HEMACUE: Hemoglobin: 15.9 g/dL — ABNORMAL HIGH (ref 12.0–15.0)

## 2010-05-16 ENCOUNTER — Ambulatory Visit (HOSPITAL_COMMUNITY)
Admission: RE | Admit: 2010-05-16 | Discharge: 2010-05-16 | Disposition: A | Discharge status: Home / Self Care | Payer: Medicare Other | Source: Ambulatory Visit | Attending: Vascular Surgery | Admitting: Vascular Surgery

## 2010-05-16 DIAGNOSIS — I70219 Atherosclerosis of native arteries of extremities with intermittent claudication, unspecified extremity: Secondary | ICD-10-CM

## 2010-05-16 DIAGNOSIS — Z01810 Encounter for preprocedural cardiovascular examination: Secondary | ICD-10-CM | POA: Insufficient documentation

## 2010-05-16 LAB — POCT I-STAT, CHEM 8
BUN: 11 mg/dL (ref 6–23)
Calcium, Ion: 1.14 mmol/L (ref 1.12–1.32)
HCT: 41 % (ref 36.0–46.0)
Hemoglobin: 13.9 g/dL (ref 12.0–15.0)
TCO2: 24 mmol/L (ref 0–100)

## 2010-05-16 LAB — GLUCOSE, CAPILLARY: Glucose-Capillary: 133 mg/dL — ABNORMAL HIGH (ref 70–99)

## 2010-05-22 ENCOUNTER — Ambulatory Visit (INDEPENDENT_AMBULATORY_CARE_PROVIDER_SITE_OTHER): Payer: Medicare Other | Admitting: Vascular Surgery

## 2010-05-22 DIAGNOSIS — I70219 Atherosclerosis of native arteries of extremities with intermittent claudication, unspecified extremity: Secondary | ICD-10-CM

## 2010-05-22 NOTE — Op Note (Signed)
NAME:  April Branch, April Branch           ACCOUNT NO.:  [id number]  MEDICAL RECORD NO.:  [id number]           PATIENT TYPE:  O  LOCATION:  [facility]                         FACILITY:  [facility]  PHYSICIAN:  [name] Mckinsley Koelzer, MD  DATE OF BIRTH:  11/14/1945  DATE OF PROCEDURE:  05/16/2010 DATE OF DISCHARGE:  05/16/2010                              OPERATIVE REPORT   PROCEDURE:  Aortogram with bilateral lower extremity runoff.  PREOPERATIVE DIAGNOSIS:  Claudication, bilateral lower extremities.  POSTOPERATIVE DIAGNOSIS:  Claudication, bilateral lower extremities.  ANESTHESIA:  Local with IV sedation.  OPERATIVE DETAILS:  After obtaining informed consent, the patient was taken to the [facility] lab.  The patient was placed in a supine position on the angio table.  Both groins were prepped and draped in usual sterile fashion.  Local anesthesia was infiltrated over the right common femoral artery.  An introducer needle was used to cannulate the right common femoral artery and a 0.035 Versacore wire threaded in the abdominal aorta under fluoroscopic guidance.  Next, a 5-French sheath was placed over the guidewire in the right common femoral artery.  A 5-French pigtail catheter was then placed over the guidewire into the abdominal aorta and abdominal aortogram obtained.  Left and right renal arteries were patent.  The superior mesenteric artery was patent.  Infrarenal abdominal aorta has some tapering and there was a shelf-like plaque at the bifurcation.  However, this did not appear to be flow-limiting and has approximately 40% stenosis.  The left and right common iliac arteries are patent.  The left and right internal iliac arteries are patent.  The left and right external iliac arteries are patent.  Next, bilateral lower extremity runoff views were obtained after pulling the pigtail catheter down just above the aortic bifurcation.  In the right leg, the right common femoral artery is patent.  The  right profunda femoris artery is patent.  The right superficial femoral artery is patent.  However, there is a high-grade calcified stenosis at the mid superficial femoral artery, which is greater than 90% stenosed.  The right popliteal artery is patent.  The right anterior tibial artery is occluded at its origin that reconstitutes via a collateral.  The tibioperoneal trunk is patent.  The proximal peroneal artery is patent, but this occludes several centimeters after its origin.  The right posterior tibial artery is patent to just above the level of the ankle and it occludes.  The anterior tibial artery is patent all the way to the level of the ankle, but it is severely diseased at this level. There is some faint opacification of the dorsalis pedis artery, but this fills primarily from collaterals and there was no in-line flow to the dorsalis pedis artery.  In the left lower extremity, the left common femoral artery is patent. The left profunda femoris artery is patent.  The left superficial femoral artery is occluded at its origin.  The above-knee popliteal artery is reconstituted at the level of the adductor hiatus via profunda collaterals.  The popliteal artery is patent.  There is three-vessel runoff to the left foot.  A lateral foot view was also obtained on the  right side.  On the lateral foot view it can be seen that there is filling of the dorsalis pedis artery from the anterior tibial artery and this does appear to have in- line flow except for the proximal occlusion of the anterior tibial artery.  The posterior tibial artery does not cross the ankle.  The anterior and posterior communicating branches of the peroneal artery are reconstituted via collaterals from the posterior tibial artery and the anterior tibial artery.  There is an incomplete plantar arch in the right foot.  Next, pigtail catheter was pulled back over a guidewire.  The 5-French sheath was left in place to  be pulled in the holding area.  The patient tolerated the procedure well and there were no complications.  OPERATIVE FINDINGS: 1. 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 2. Proximal occlusion of right anterior tibial artery with     reconstitution via collaterals with the anterior tibial artery     patent distally and the dorsalis pedis artery crossing the foot,     severely diseased peroneal artery with occlusion of the mid leg,     and severely diseased posterior tibial artery in the right leg with     occlusion at the level of the ankle.  In the left lower extremity, occlusion of the left superficial femoral artery at the adductor hiatus.  There is three-vessel runoff to the left foot.  Plan will be to have the patient returned for an office visit next week to discuss the possibility of a left fem-pop bypass.  We will also consider whether or not angioplasty and stenting or conservative measures will be the best treatment for the right leg at this point.     [name] Garmon Dehn, MD     CEF/MEDQ  D:  05/16/2010  T:  05/17/2010  Job:  [id number]  Electronically Signed by Fabienne Bruns MD on 05/22/2010 10:53:59 AM

## 2010-05-23 NOTE — Assessment & Plan Note (Signed)
OFFICE VISIT  LATRONDA, SPINK DOB:  12/26/1945                                       05/22/2010 [id number]#:[id number]  The patient returns for followup today.  She underwent aortogram and bilateral lower extremity runoff last week.  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 On the right side, she had a focal 90% stenosis of the right superficial femoral artery.  She returns today for further followup and discussions regarding treatment plan.  She still states that she has claudication- type symptoms in the left lower extremity that occur after walking approximately 10-20 yards.  She also has symptoms in the right leg but these are not quite as severe.  She denies rest pain.  She has had no problems from her cath stick site.  REVIEW OF SYSTEMS:  She denies any shortness of breath or chest pain.  PHYSICAL EXAM:  Blood pressure is 150/77 in the left arm, heart rate is 102 and regular, respirations 16.  Lower extremities:  She has no evidence of pseudoaneurysm or hematoma in the groin.  She has no palpable pedal or popliteal pulses bilaterally.  Feet are pink, warm, and well perfused.  I am pleased to report that the patient has quit smoking.  She wishes to have a left fem-pop bypass to improve her claudication symptoms in her left leg.  I discussed with her today the possibility of stenting her right superficial femoral artery as this would be anatomically feasible. However, she wishes to defer any treatment of the right leg at this point.  She, however, does wish to try to do a fem-pop bypass in the left leg for symptomatic relief.  Risks, benefits, possible complications, and procedure details including but not limited to bleeding, infection, and graft thrombosis were explained to the patient today.  She understands and agrees  to proceed.  We have scheduled her left fem-pop bypass for next week.    [name] Deseree Zemaitis, MD Electronically Signed  CEF/MEDQ  D:  05/22/2010  T:  05/23/2010  Job:  [id number]  cc:   Noralyn Pick. Eden Emms, MD, [facility] Maurice March, M.D.

## 2010-05-26 ENCOUNTER — Other Ambulatory Visit: Payer: Self-pay | Admitting: Vascular Surgery

## 2010-05-26 ENCOUNTER — Encounter (HOSPITAL_COMMUNITY)
Admission: RE | Admit: 2010-05-26 | Discharge: 2010-05-26 | Disposition: A | Discharge status: Home / Self Care | Payer: Medicare Other | Source: Ambulatory Visit | Attending: Vascular Surgery | Admitting: Vascular Surgery

## 2010-05-26 DIAGNOSIS — I739 Peripheral vascular disease, unspecified: Secondary | ICD-10-CM

## 2010-05-26 LAB — TYPE AND SCREEN
ABO/RH(D): O POS
Antibody Screen: NEGATIVE

## 2010-05-26 LAB — URINALYSIS, ROUTINE W REFLEX MICROSCOPIC
Nitrite: NEGATIVE
Specific Gravity, Urine: 1.018 (ref 1.005–1.030)
Urobilinogen, UA: 1 mg/dL (ref 0.0–1.0)

## 2010-05-26 LAB — CBC
MCV: 85.6 fL (ref 78.0–100.0)
Platelets: 309 10*3/uL (ref 150–400)
RDW: 14.1 % (ref 11.5–15.5)
WBC: 12.5 10*3/uL — ABNORMAL HIGH (ref 4.0–10.5)

## 2010-05-26 LAB — ABO/RH: ABO/RH(D): O POS

## 2010-05-26 LAB — COMPREHENSIVE METABOLIC PANEL
Albumin: 3.8 g/dL (ref 3.5–5.2)
BUN: 6 mg/dL (ref 6–23)
Calcium: 9 mg/dL (ref 8.4–10.5)
Chloride: 102 mEq/L (ref 96–112)
Creatinine, Ser: 0.82 mg/dL (ref 0.4–1.2)
GFR calc non Af Amer: >60 mL/min (ref >60)
Total Bilirubin: 0.3 mg/dL (ref 0.3–1.2)

## 2010-05-26 LAB — PROTIME-INR: Prothrombin Time: 12.1 seconds (ref 11.6–15.2)

## 2010-05-26 LAB — APTT: aPTT: 27 seconds (ref 24–37)

## 2010-05-26 LAB — URINE MICROSCOPIC-ADD ON

## 2010-05-27 ENCOUNTER — Inpatient Hospital Stay (HOSPITAL_COMMUNITY)
Admission: RE | Admit: 2010-05-27 | Discharge: 2010-06-01 | DRG: 253 | Disposition: A | Discharge status: Home Health | Payer: Medicare Other | Source: Ambulatory Visit | Attending: Vascular Surgery | Admitting: Vascular Surgery

## 2010-05-27 ENCOUNTER — Other Ambulatory Visit: Payer: Self-pay | Admitting: Vascular Surgery

## 2010-05-27 DIAGNOSIS — Z01818 Encounter for other preprocedural examination: Secondary | ICD-10-CM

## 2010-05-27 DIAGNOSIS — Z79899 Other long term (current) drug therapy: Secondary | ICD-10-CM

## 2010-05-27 DIAGNOSIS — I743 Embolism and thrombosis of arteries of the lower extremities: Secondary | ICD-10-CM

## 2010-05-27 DIAGNOSIS — Z01812 Encounter for preprocedural laboratory examination: Secondary | ICD-10-CM

## 2010-05-27 DIAGNOSIS — Z794 Long term (current) use of insulin: Secondary | ICD-10-CM

## 2010-05-27 DIAGNOSIS — J9819 Other pulmonary collapse: Secondary | ICD-10-CM | POA: Diagnosis not present

## 2010-05-27 DIAGNOSIS — Z01810 Encounter for preprocedural cardiovascular examination: Secondary | ICD-10-CM

## 2010-05-27 DIAGNOSIS — Z87891 Personal history of nicotine dependence: Secondary | ICD-10-CM

## 2010-05-27 DIAGNOSIS — I739 Peripheral vascular disease, unspecified: Principal | ICD-10-CM | POA: Diagnosis present

## 2010-05-27 DIAGNOSIS — E119 Type 2 diabetes mellitus without complications: Secondary | ICD-10-CM | POA: Diagnosis present

## 2010-05-27 DIAGNOSIS — K219 Gastro-esophageal reflux disease without esophagitis: Secondary | ICD-10-CM | POA: Diagnosis present

## 2010-05-27 HISTORY — PX: PR VEIN BYPASS GRAFT,AORTO-FEM-POP: 35551

## 2010-05-27 LAB — GLUCOSE, CAPILLARY: Glucose-Capillary: 141 mg/dL — ABNORMAL HIGH (ref 70–99)

## 2010-05-28 DIAGNOSIS — I739 Peripheral vascular disease, unspecified: Secondary | ICD-10-CM

## 2010-05-28 LAB — CBC
HCT: 35.4 % — ABNORMAL LOW (ref 36.0–46.0)
Hemoglobin: 11.7 g/dL — ABNORMAL LOW (ref 12.0–15.0)
MCH: 28.1 pg (ref 26.0–34.0)
MCHC: 33.1 g/dL (ref 30.0–36.0)
MCV: 85.1 fL (ref 78.0–100.0)
Platelets: 241 10*3/uL (ref 150–400)
RBC: 4.16 MIL/uL (ref 3.87–5.11)
RDW: 14.1 % (ref 11.5–15.5)
WBC: 10.9 10*3/uL — ABNORMAL HIGH (ref 4.0–10.5)

## 2010-05-28 LAB — GLUCOSE, CAPILLARY
Glucose-Capillary: 135 mg/dL — ABNORMAL HIGH (ref 70–99)
Glucose-Capillary: 145 mg/dL — ABNORMAL HIGH (ref 70–99)
Glucose-Capillary: 194 mg/dL — ABNORMAL HIGH (ref 70–99)
Glucose-Capillary: 304 mg/dL — ABNORMAL HIGH (ref 70–99)
Glucose-Capillary: 58 mg/dL — ABNORMAL LOW (ref 70–99)
Glucose-Capillary: 71 mg/dL (ref 70–99)

## 2010-05-28 LAB — BASIC METABOLIC PANEL
Chloride: 108 mEq/L (ref 96–112)
GFR calc Af Amer: >60 mL/min (ref >60)
GFR calc non Af Amer: >60 mL/min (ref >60)
Potassium: 4 mEq/L (ref 3.5–5.1)
Sodium: 137 mEq/L (ref 135–145)

## 2010-05-29 LAB — URINALYSIS, MICROSCOPIC ONLY
Bilirubin Urine: NEGATIVE
Glucose, UA: 100 mg/dL — AB
Protein, ur: NEGATIVE mg/dL
Urobilinogen, UA: 0.2 mg/dL (ref 0.0–1.0)

## 2010-05-29 LAB — GLUCOSE, CAPILLARY
Glucose-Capillary: 135 mg/dL — ABNORMAL HIGH (ref 70–99)
Glucose-Capillary: 161 mg/dL — ABNORMAL HIGH (ref 70–99)
Glucose-Capillary: 176 mg/dL — ABNORMAL HIGH (ref 70–99)

## 2010-05-30 ENCOUNTER — Inpatient Hospital Stay (HOSPITAL_COMMUNITY): Payer: Medicare Other

## 2010-05-30 LAB — CBC
HCT: 33.1 % — ABNORMAL LOW (ref 36.0–46.0)
MCHC: 32.6 g/dL (ref 30.0–36.0)
MCV: 85.8 fL (ref 78.0–100.0)
Platelets: 197 10*3/uL (ref 150–400)
RDW: 14.3 % (ref 11.5–15.5)
WBC: 11 10*3/uL — ABNORMAL HIGH (ref 4.0–10.5)

## 2010-05-30 LAB — URINE CULTURE
Colony Count: 8000
Special Requests: POSITIVE

## 2010-05-31 LAB — GLUCOSE, CAPILLARY

## 2010-05-31 LAB — CBC
HCT: 30.8 % — ABNORMAL LOW (ref 36.0–46.0)
Hemoglobin: 9.9 g/dL — ABNORMAL LOW (ref 12.0–15.0)
MCH: 27.7 pg (ref 26.0–34.0)
MCHC: 32.1 g/dL (ref 30.0–36.0)
MCV: 86.3 fL (ref 78.0–100.0)
RDW: 14.1 % (ref 11.5–15.5)

## 2010-06-01 LAB — POCT URINALYSIS DIP (DEVICE)
Nitrite: NEGATIVE
pH: 5 (ref 5.0–8.0)

## 2010-06-01 LAB — CBC
HCT: 28.7 % — ABNORMAL LOW (ref 36.0–46.0)
Hemoglobin: 9.3 g/dL — ABNORMAL LOW (ref 12.0–15.0)
MCHC: 32.4 g/dL (ref 30.0–36.0)
RBC: 3.3 MIL/uL — ABNORMAL LOW (ref 3.87–5.11)
WBC: 9.7 10*3/uL (ref 4.0–10.5)

## 2010-06-01 LAB — BASIC METABOLIC PANEL
CO2: 26 mEq/L (ref 19–32)
Calcium: 8.5 mg/dL (ref 8.4–10.5)
Chloride: 101 mEq/L (ref 96–112)
GFR calc Af Amer: >60 mL/min (ref >60)
Potassium: 3.8 mEq/L (ref 3.5–5.1)
Sodium: 136 mEq/L (ref 135–145)

## 2010-06-01 LAB — GLUCOSE, CAPILLARY: Glucose-Capillary: 107 mg/dL — ABNORMAL HIGH (ref 70–99)

## 2010-06-02 NOTE — Op Note (Signed)
NAME:  April Branch, April Branch           ACCOUNT NO.:  [id number]  MEDICAL RECORD NO.:  [id number]           PATIENT TYPE:  I  LOCATION:  2031                         FACILITY:  [facility]  PHYSICIAN:  [name] Miray Mancino, MD  DATE OF BIRTH:  [birth year]  DATE OF PROCEDURE:  05/27/2010 DATE OF DISCHARGE:                              OPERATIVE REPORT   PROCEDURE:  Left femoral to above-knee popliteal bypass using non- reversed greater saphenous vein.  PREOPERATIVE DIAGNOSIS:  Claudication, left leg.  POSTOPERATIVE DIAGNOSIS:  Claudication, left leg.  ANESTHESIA:  General.  ASSISTANT:  Pecola Leisure, PA-C  OPERATIVE FINDINGS:  A 2.5 to 3-mm left greater saphenous vein. Duplicated greater saphenous vein with smaller segment of vein left in continuity via collateral branches.  SPECIMENS:  Left groin lymph node.  OPERATIVE DETAILS:  After obtaining informed consent, the patient was taken to the operating room.  The patient was placed in a supine position on the operating table.  After induction of general anesthesia and endotracheal intubation, the patient's entire left lower extremity was prepped and draped in usual sterile fashion.  Ultrasound was used to identify the left greater saphenous vein and of course was marked out prior to making a skin incision.  Next, a left groin incision was made in a longitudinal fashion, carried down through subcutaneous tissues down to the level of the left common femoral artery.  The artery was soft in character.  The artery was dissected free circumferentially all the way up to the level of the inguinal ligament.  Superficial femoral artery and profunda femoris arteries were also dissected free circumferentially and vessel loops placed around these.  Next, greater saphenous vein was dissected free in the medial portion of the incision. This had a very early bifurcation and basically had a duplicated system. The deeper more medial branch appeared to  be of larger caliber.  Several side branches were taken at the level of the saphenofemoral junction; however, the smaller of the bifurcated branches was left in continuity through some collateral branches in hopes that this would maintain patency in case it was needed in the future.  Next the greater saphenous vein was harvested through several skip incisions down the medial aspect of the left leg.  Small side branches were ligated and divided between clips and ties.  The vein was harvested all the way to the level of the knee and slightly below the knee joint.  The medial incision at the level of the above-knee area was deepened down through the fascia and the above-knee popliteal space was entered.  The above-knee popliteal artery was dissected free circumferentially and vessel loop placed around this.  The artery was soft in character.  A subsartorial tunnel was then created connecting the above-knee popliteal space to the groin and a tunneler was left in place.  The greater saphenous vein was then harvested from the saphenofemoral junction to several centimeters below the knee.  Vein was inspected and gently distended with heparinized saline.  A few 7-0 Prolene sutures were placed in the vein to repair small side branches.  The patient was given 7000 units of intravenous heparin.  The  left common femoral artery was controlled proximally with a Henley clamp.  The profunda femoris and superficial femoral arteries were then controlled with vessel loops.  A longitudinal opening was made in the common femoral artery just above the femoral bifurcation.  The patient had a fairly high bifurcation so that the anastomosis was right at the level of the inguinal ligament.  The vein was placed in a non reversed configuration and opened longitudinally and sewn end to vein to side of artery using a running 5- 0 Prolene suture.  Just prior to completion, anastomosis was fore bled, back bled, and  thoroughly flushed.  Anastomosis was secured, clamps released, there was pulsatile flow in the proximal aspect of vein graft immediately.  An additional repair stitch was placed on the lateral aspect of the graft.  Next a valvulotome was used to lyse all valves in the graft.  There was good pulsatile flow all the way down through the vein.  The vein was of good quality approximately 2.5 to 3 mm in diameter.  Vein was marked for orientation and brought through the subsartorial tunnel.  The above-knee popliteal artery was then controlled proximally and distally with Henley clamps.  A longitudinal opening was made in the above-knee popliteal artery.  The graft was cut to length and spatulated and sewn end to graft to side of artery using a running 6-0 Prolene suture.  Just prior to completion, anastomosis was fore bled, back bled, and thoroughly flushed.  Anastomosis was secured, clamps were released, there was pulsatile flow in the distal popliteal artery immediately.  There was triphasic Doppler flow in the posterior tibial area and biphasic Doppler flow in the dorsalis pedis area.  This augmented approximately 70% with unclamping of the graft.  Next hemostasis was obtained.  The groin incision was then closed with multiple layers of 2-0 and 3-0 Vicryl suture and 4-0 Vicryl subcuticular stitch in the skin.  The saphenectomy incisions were closed with multiple layers of 3-0 Vicryl suture and 4-0 Vicryl subcuticular stitch in the skin.  The above-knee popliteal incision was then closed with a running 2-0 Vicryl in the deep layer and a 3-0 Vicryl subcutaneous layer and 4-0 Vicryl subcuticular stitch in the skin.  Dermabond was then applied to all incisions.  The patient had a palpable dorsalis pedis pulse at the end of the case.  Instrument, sponge, and needle counts were correct at the end of this case.  The patient tolerated the procedure well and there were no complications.  The patient  was extubated in the operating room and taken to the recovery room in a stable condition.     [name], MD     CEF/MEDQ  D:  05/28/2010  T:  05/28/2010  Job:  [id number]  Electronically Signed by Fabienne Bruns MD on 06/02/2010 07:53:37 AM

## 2010-06-05 ENCOUNTER — Ambulatory Visit (INDEPENDENT_AMBULATORY_CARE_PROVIDER_SITE_OTHER): Payer: Medicare Other

## 2010-06-05 ENCOUNTER — Inpatient Hospital Stay (HOSPITAL_COMMUNITY)
Admission: EM | Admit: 2010-06-05 | Discharge: 2010-06-13 | DRG: 863 | Disposition: A | Discharge status: Home Health | Payer: Medicare Other | Attending: Family Medicine | Admitting: Family Medicine

## 2010-06-05 DIAGNOSIS — L03119 Cellulitis of unspecified part of limb: Secondary | ICD-10-CM | POA: Diagnosis present

## 2010-06-05 DIAGNOSIS — L02419 Cutaneous abscess of limb, unspecified: Secondary | ICD-10-CM | POA: Diagnosis present

## 2010-06-05 DIAGNOSIS — R4789 Other speech disturbances: Secondary | ICD-10-CM | POA: Diagnosis present

## 2010-06-05 DIAGNOSIS — Y849 Medical procedure, unspecified as the cause of abnormal reaction of the patient, or of later complication, without mention of misadventure at the time of the procedure: Secondary | ICD-10-CM | POA: Diagnosis present

## 2010-06-05 DIAGNOSIS — E1169 Type 2 diabetes mellitus with other specified complication: Secondary | ICD-10-CM | POA: Diagnosis present

## 2010-06-05 DIAGNOSIS — I289 Disease of pulmonary vessels, unspecified: Secondary | ICD-10-CM | POA: Diagnosis present

## 2010-06-05 DIAGNOSIS — K209 Esophagitis, unspecified without bleeding: Secondary | ICD-10-CM | POA: Diagnosis present

## 2010-06-05 DIAGNOSIS — F17200 Nicotine dependence, unspecified, uncomplicated: Secondary | ICD-10-CM | POA: Diagnosis present

## 2010-06-05 DIAGNOSIS — E039 Hypothyroidism, unspecified: Secondary | ICD-10-CM | POA: Diagnosis present

## 2010-06-05 DIAGNOSIS — T8140XA Infection following a procedure, unspecified, initial encounter: Principal | ICD-10-CM | POA: Diagnosis present

## 2010-06-05 DIAGNOSIS — E785 Hyperlipidemia, unspecified: Secondary | ICD-10-CM | POA: Diagnosis present

## 2010-06-05 DIAGNOSIS — E876 Hypokalemia: Secondary | ICD-10-CM | POA: Diagnosis present

## 2010-06-05 DIAGNOSIS — I70219 Atherosclerosis of native arteries of extremities with intermittent claudication, unspecified extremity: Secondary | ICD-10-CM

## 2010-06-05 LAB — GLUCOSE, CAPILLARY: Glucose-Capillary: 343 mg/dL — ABNORMAL HIGH (ref 70–99)

## 2010-06-05 LAB — DIFFERENTIAL
Basophils Absolute: 0.1 10*3/uL (ref 0.0–0.1)
Eosinophils Absolute: 0.3 10*3/uL (ref 0.0–0.7)
Eosinophils Relative: 3 % (ref 0–5)
Lymphocytes Relative: 22 % (ref 12–46)
Neutrophils Relative %: 69 % (ref 43–77)

## 2010-06-05 LAB — BASIC METABOLIC PANEL
GFR calc non Af Amer: >60 mL/min (ref >60)
Potassium: 3.7 mEq/L (ref 3.5–5.1)
Sodium: 141 mEq/L (ref 135–145)

## 2010-06-05 LAB — CBC
HCT: 33.3 % — ABNORMAL LOW (ref 36.0–46.0)
Platelets: 547 10*3/uL — ABNORMAL HIGH (ref 150–400)
RDW: 14 % (ref 11.5–15.5)
WBC: 10.6 10*3/uL — ABNORMAL HIGH (ref 4.0–10.5)

## 2010-06-05 LAB — TRIGLYCERIDES: Triglycerides: 198 mg/dL — ABNORMAL HIGH (ref <150)

## 2010-06-05 NOTE — Assessment & Plan Note (Signed)
OFFICE VISIT  April, Branch DOB:  11/12/1945                                       06/05/2010 [id number]#:[id number]  CHIEF COMPLAINT:  Patient is a 65 year old woman who had a left femoral to above-knee popliteal bypass using nonreversed greater saphenous vein by Dr. Darrick Penna on 04/03 2012.  The patient was discharged to home.  She began having multiple episodes of vomiting at home and could not keep down any of her medications or any food.  She also noted some firmness and redness around the wounds of her left lower extremity.  The patient denies any abdominal pain.  She states that the food does seem to be going down and then she throws it back up.  She has had no previous abdominal surgery except for a cholecystectomy.  The left lower extremity otherwise feels well and is warm and pink without pain or claudication.  PHYSICAL EXAMINATION:  This is a well-developed, well-nourished woman in mild distress secondary to her nausea.  Heart rate is 80, blood pressure is 150/81.  Her temperature was 97.9.  Abdomen was soft and nontender with minimal bowel sounds.  Left lower extremity was warm and pink.  She had an area of fullness on the upper thigh wound.  The groin wound was healing fairly well.  She had a little bit of redness around each of the incisions but no real cellulitis.  There is some tenderness over the area of the possible seroma in that 1 incision.  There was no drainage. Foot was warm and well-perfused.  ASSESSMENT: 1. Status post left femoral-popliteal on May 27, 2010 with some mild     erythema around the wounds and 1 small area of fullness at one of     the vein harvest sites without any drainage. 2. Anorexia, nausea and vomiting in this diabetic patient, who has     been unable to eat or take any of her medications.  She was     referred to the [facility] ER for workup and hydration.  Medical     service was called to assist with this  patient, and she will     probably be admitted to their service.  Della Goo, PA-C  Larina Earthly, M.D. Electronically Signed  RR/MEDQ  D:  06/05/2010  T:  06/05/2010  Job:  [id number]

## 2010-06-06 DIAGNOSIS — M7989 Other specified soft tissue disorders: Secondary | ICD-10-CM

## 2010-06-06 DIAGNOSIS — K219 Gastro-esophageal reflux disease without esophagitis: Secondary | ICD-10-CM

## 2010-06-06 DIAGNOSIS — E118 Type 2 diabetes mellitus with unspecified complications: Secondary | ICD-10-CM

## 2010-06-06 DIAGNOSIS — L03119 Cellulitis of unspecified part of limb: Secondary | ICD-10-CM

## 2010-06-06 DIAGNOSIS — L02419 Cutaneous abscess of limb, unspecified: Secondary | ICD-10-CM

## 2010-06-06 LAB — CARDIAC PANEL(CRET KIN+CKTOT+MB+TROPI)
CK, MB: 0.7 ng/mL (ref 0.3–4.0)
Total CK: 34 U/L (ref 7–177)

## 2010-06-06 LAB — BASIC METABOLIC PANEL
BUN: 6 mg/dL (ref 6–23)
Creatinine, Ser: 0.77 mg/dL (ref 0.4–1.2)
GFR calc non Af Amer: >60 mL/min (ref >60)

## 2010-06-06 LAB — DIFFERENTIAL
Basophils Relative: 1 % (ref 0–1)
Eosinophils Absolute: 0.3 10*3/uL (ref 0.0–0.7)
Monocytes Relative: 5 % (ref 3–12)
Neutrophils Relative %: 61 % (ref 43–77)

## 2010-06-06 LAB — CBC
MCH: 27.8 pg (ref 26.0–34.0)
Platelets: 475 10*3/uL — ABNORMAL HIGH (ref 150–400)
RBC: 3.49 MIL/uL — ABNORMAL LOW (ref 3.87–5.11)
WBC: 9.6 10*3/uL (ref 4.0–10.5)

## 2010-06-06 LAB — GLUCOSE, CAPILLARY
Glucose-Capillary: 114 mg/dL — ABNORMAL HIGH (ref 70–99)
Glucose-Capillary: 106 mg/dL — ABNORMAL HIGH (ref 70–99)
Glucose-Capillary: 101 mg/dL — ABNORMAL HIGH (ref 70–99)
Glucose-Capillary: 118 mg/dL — ABNORMAL HIGH (ref 70–99)

## 2010-06-06 LAB — MRSA PCR SCREENING: MRSA by PCR: NEGATIVE

## 2010-06-07 LAB — GLUCOSE, CAPILLARY
Glucose-Capillary: 86 mg/dL (ref 70–99)
Glucose-Capillary: 56 mg/dL — ABNORMAL LOW (ref 70–99)
Glucose-Capillary: 80 mg/dL (ref 70–99)
Glucose-Capillary: 112 mg/dL — ABNORMAL HIGH (ref 70–99)

## 2010-06-08 LAB — BASIC METABOLIC PANEL
BUN: 2 mg/dL — ABNORMAL LOW (ref 6–23)
CO2: 23 mEq/L (ref 19–32)
Calcium: 7.7 mg/dL — ABNORMAL LOW (ref 8.4–10.5)
Calcium: 7.7 mg/dL — ABNORMAL LOW (ref 8.4–10.5)
Chloride: 109 mEq/L (ref 96–112)
Creatinine, Ser: 0.77 mg/dL (ref 0.4–1.2)
Creatinine, Ser: 0.77 mg/dL (ref 0.4–1.2)
GFR calc Af Amer: >60 mL/min (ref >60)
GFR calc Af Amer: >60 mL/min (ref >60)
GFR calc non Af Amer: >60 mL/min (ref >60)
GFR calc non Af Amer: >60 mL/min (ref >60)
Glucose, Bld: 106 mg/dL — ABNORMAL HIGH (ref 70–99)
Potassium: 2.7 mEq/L — CL (ref 3.5–5.1)
Sodium: 139 mEq/L (ref 135–145)
Sodium: 140 mEq/L (ref 135–145)

## 2010-06-08 LAB — PHOSPHORUS: Phosphorus: 2.2 mg/dL — ABNORMAL LOW (ref 2.3–4.6)

## 2010-06-08 LAB — CBC
HCT: 29.7 % — ABNORMAL LOW (ref 36.0–46.0)
Hemoglobin: 9.7 g/dL — ABNORMAL LOW (ref 12.0–15.0)
MCH: 27.7 pg (ref 26.0–34.0)
MCHC: 32.7 g/dL (ref 30.0–36.0)
MCV: 84.9 fL (ref 78.0–100.0)
Platelets: 484 10*3/uL — ABNORMAL HIGH (ref 150–400)
RBC: 3.5 MIL/uL — ABNORMAL LOW (ref 3.87–5.11)
RDW: 14.5 % (ref 11.5–15.5)
WBC: 12.3 10*3/uL — ABNORMAL HIGH (ref 4.0–10.5)

## 2010-06-08 LAB — GLUCOSE, CAPILLARY
Glucose-Capillary: 106 mg/dL — ABNORMAL HIGH (ref 70–99)
Glucose-Capillary: 173 mg/dL — ABNORMAL HIGH (ref 70–99)

## 2010-06-09 LAB — BASIC METABOLIC PANEL
CO2: 25 mEq/L (ref 19–32)
Calcium: 8 mg/dL — ABNORMAL LOW (ref 8.4–10.5)
Creatinine, Ser: 0.71 mg/dL (ref 0.4–1.2)
GFR calc Af Amer: >60 mL/min (ref >60)
GFR calc non Af Amer: >60 mL/min (ref >60)
Glucose, Bld: 68 mg/dL — ABNORMAL LOW (ref 70–99)
Sodium: 138 mEq/L (ref 135–145)

## 2010-06-09 LAB — CBC
HCT: 29.8 % — ABNORMAL LOW (ref 36.0–46.0)
MCH: 27.6 pg (ref 26.0–34.0)
MCHC: 32.9 g/dL (ref 30.0–36.0)
RDW: 14.5 % (ref 11.5–15.5)

## 2010-06-09 LAB — GLUCOSE, CAPILLARY: Glucose-Capillary: 97 mg/dL (ref 70–99)

## 2010-06-10 LAB — CBC
HCT: 27.6 % — ABNORMAL LOW (ref 36.0–46.0)
MCH: 27.6 pg (ref 26.0–34.0)
MCV: 84.7 fL (ref 78.0–100.0)
Platelets: 449 10*3/uL — ABNORMAL HIGH (ref 150–400)
RBC: 3.26 MIL/uL — ABNORMAL LOW (ref 3.87–5.11)
WBC: 9.7 10*3/uL (ref 4.0–10.5)

## 2010-06-10 LAB — BASIC METABOLIC PANEL
BUN: 1 mg/dL — ABNORMAL LOW (ref 6–23)
CO2: 27 mEq/L (ref 19–32)
Chloride: 111 mEq/L (ref 96–112)
Creatinine, Ser: 0.77 mg/dL (ref 0.4–1.2)
Glucose, Bld: 112 mg/dL — ABNORMAL HIGH (ref 70–99)
Potassium: 3.1 mEq/L — ABNORMAL LOW (ref 3.5–5.1)

## 2010-06-10 LAB — GLUCOSE, CAPILLARY: Glucose-Capillary: 178 mg/dL — ABNORMAL HIGH (ref 70–99)

## 2010-06-11 LAB — GLUCOSE, CAPILLARY
Glucose-Capillary: 114 mg/dL — ABNORMAL HIGH (ref 70–99)
Glucose-Capillary: 50 mg/dL — ABNORMAL LOW (ref 70–99)
Glucose-Capillary: 112 mg/dL — ABNORMAL HIGH (ref 70–99)
Glucose-Capillary: 99 mg/dL (ref 70–99)

## 2010-06-11 LAB — BASIC METABOLIC PANEL
BUN: 4 mg/dL — ABNORMAL LOW (ref 6–23)
Chloride: 109 mEq/L (ref 96–112)
GFR calc Af Amer: >60 mL/min (ref >60)
GFR calc non Af Amer: >60 mL/min (ref >60)
Potassium: 4.1 mEq/L (ref 3.5–5.1)
Sodium: 140 mEq/L (ref 135–145)

## 2010-06-12 ENCOUNTER — Ambulatory Visit: Payer: Medicare Other | Admitting: Vascular Surgery

## 2010-06-12 DIAGNOSIS — M7989 Other specified soft tissue disorders: Secondary | ICD-10-CM

## 2010-06-12 LAB — CULTURE, BLOOD (ROUTINE X 2): Culture  Setup Time: 201204130152

## 2010-06-12 LAB — GLUCOSE, CAPILLARY
Glucose-Capillary: 45 mg/dL — ABNORMAL LOW (ref 70–99)
Glucose-Capillary: 67 mg/dL — ABNORMAL LOW (ref 70–99)
Glucose-Capillary: 90 mg/dL (ref 70–99)
Glucose-Capillary: 48 mg/dL — ABNORMAL LOW (ref 70–99)
Glucose-Capillary: 127 mg/dL — ABNORMAL HIGH (ref 70–99)

## 2010-06-12 LAB — BASIC METABOLIC PANEL
BUN: 5 mg/dL — ABNORMAL LOW (ref 6–23)
CO2: 27 mEq/L (ref 19–32)
Chloride: 107 mEq/L (ref 96–112)
Creatinine, Ser: 0.77 mg/dL (ref 0.4–1.2)
GFR calc Af Amer: >60 mL/min (ref >60)

## 2010-06-13 LAB — GLUCOSE, CAPILLARY
Glucose-Capillary: 76 mg/dL (ref 70–99)
Glucose-Capillary: 88 mg/dL (ref 70–99)
Glucose-Capillary: 97 mg/dL (ref 70–99)

## 2010-06-14 ENCOUNTER — Emergency Department (HOSPITAL_COMMUNITY)
Admission: EM | Admit: 2010-06-14 | Discharge: 2010-06-14 | Disposition: A | Discharge status: Home / Self Care | Payer: Medicare Other | Attending: Emergency Medicine | Admitting: Emergency Medicine

## 2010-06-14 DIAGNOSIS — G609 Hereditary and idiopathic neuropathy, unspecified: Secondary | ICD-10-CM | POA: Insufficient documentation

## 2010-06-14 DIAGNOSIS — E119 Type 2 diabetes mellitus without complications: Secondary | ICD-10-CM | POA: Insufficient documentation

## 2010-06-14 DIAGNOSIS — F29 Unspecified psychosis not due to a substance or known physiological condition: Secondary | ICD-10-CM | POA: Insufficient documentation

## 2010-06-14 DIAGNOSIS — K219 Gastro-esophageal reflux disease without esophagitis: Secondary | ICD-10-CM | POA: Insufficient documentation

## 2010-06-14 DIAGNOSIS — Z8614 Personal history of Methicillin resistant Staphylococcus aureus infection: Secondary | ICD-10-CM | POA: Insufficient documentation

## 2010-06-14 DIAGNOSIS — Z85528 Personal history of other malignant neoplasm of kidney: Secondary | ICD-10-CM | POA: Insufficient documentation

## 2010-06-14 DIAGNOSIS — E039 Hypothyroidism, unspecified: Secondary | ICD-10-CM | POA: Insufficient documentation

## 2010-06-14 DIAGNOSIS — E7800 Pure hypercholesterolemia, unspecified: Secondary | ICD-10-CM | POA: Insufficient documentation

## 2010-06-14 DIAGNOSIS — M79609 Pain in unspecified limb: Secondary | ICD-10-CM | POA: Insufficient documentation

## 2010-06-14 DIAGNOSIS — Z8541 Personal history of malignant neoplasm of cervix uteri: Secondary | ICD-10-CM | POA: Insufficient documentation

## 2010-06-15 NOTE — Consult Note (Signed)
NAME:  April Branch, April Branch           ACCOUNT NO.:  [id number]  MEDICAL RECORD NO.:  [id number]           PATIENT TYPE:  E  LOCATION:  [facility]                         FACILITY:  [facility]  PHYSICIAN:  Jordan Hawks. Elnoria Howard, MD    DATE OF BIRTH:  [birth year]  DATE OF CONSULTATION:  06/12/2010 DATE OF DISCHARGE:                                CONSULTATION   PRIMARY CARE PHYSICIAN:  This is an unassigned Teaching Service patient.  VASCULAR SURGEON:  [name] Fields, MD  REASON FOR CONSULTATION:  Nausea, vomiting, and dysphagia.  HISTORY OF PRESENT ILLNESS:  This is a 65 year old female with a past medical history of peripheral vascular disease status post left femoral above-the-knee popliteal bypass who was admitted to the hospital with cellulitis as a result of the incision site.  The patient over the course of her hospitalization has improved with regards to this issue. However, she has had issues with nausea and vomiting and dysphagia.  The patient has a history of gastroesophageal reflux disease but she reports that this has been markedly worse since her hospitalization.  She denies having any issues with dysphagia in the past.  Over the course of the hospitalization, she has been treated with Protonix and she has had improvement of her symptoms and then subsequently worsening of her symptoms at this time.  She reports that she is not able to swallow properly.  She feels that the food gets stuck in her chest and as a result she has to vomit the food.  She never had this type of issue in the past and subsequently as a result of symptoms, a GI consultation was requested for further evaluation and treatment.  PAST MEDICAL HISTORY/PAST SURGICAL HISTORY:  As stated above.  FAMILY HISTORY:  Noncontributory.  SOCIAL HISTORY:  Significant for tobacco but negative for alcohol or illicit drug use.  REVIEW OF SYSTEMS:  As stated above in history of present illness.  PHYSICAL EXAMINATION:  VITAL  SIGNS:  Stable. GENERAL:  The patient is in no acute distress, alert, and oriented. HEENT:  Normocephalic and atraumatic.  Extraocular muscles are intact. NECK:  Supple.  No lymphadenopathy. LUNGS:  Clear to auscultation bilaterally. CARDIOVASCULAR:  Regular rate and rhythm. ABDOMEN:  Flat, soft, tender in the epigastrium.  No rebound or rigidity.  Positive bowel sounds. EXTREMITIES:  No clubbing, cyanosis, or edema.  MEDICATIONS: 1. Amitriptyline 50 mg p.o. at bedtime. 2. Keflex 500 mg p.o. q.12 h. 3. Cyclobenzaprine 5 mg p.o. b.i.d. 4. Subcu heparin. 5. Synthroid 75 mcg p.o. daily. 6. Protonix 40 mg p.o. b.i.d. 7. Morphine 4 mg IV q.4 h. p.r.n. 8. Percocet 1-2 tablets p.o. q.6 h. p.r.n. 9. Phenergan 25 mg p.o. q.6 h. p.r.n.  ALLERGIES:  NYSTATIN and Simvastatin.  LABORATORY VALUES:  White blood cell count is 9.7, hemoglobin 9.0, MCV is 84.7, and platelets at 449.  Sodium 140, potassium 3.7, chloride 107, CO2 27, glucose 40, BUN 5, and creatinine is 0.7.  IMPRESSION: 1. Dysphagia. 2. Nausea and vomiting. 3. History of gastroesophageal reflux disease.  In light of the patient's symptoms, further evaluation with EGD is necessary.  It may be that she  has a refractory esophagitis refractory to the Protonix.  She may also have a candidal esophagitis because of the antibiotics that she has been taking.  Regardless, further evaluation with the EGD should help to clarify this issue.  Plan is for an EGD tomorrow.  Further recommendations pending the findings.     Jordan Hawks Elnoria Howard, MD     PDH/MEDQ  D:  06/12/2010  T:  06/13/2010  Job:  [id number]  Electronically Signed by Jeani Hawking MD on 06/15/2010 08:54:29 PM

## 2010-06-16 NOTE — Discharge Summary (Signed)
NAME:  April Branch, April Branch           ACCOUNT NO.:  [id number]  MEDICAL RECORD NO.:  [id number]           PATIENT TYPE:  E  LOCATION:  [facility]                         FACILITY:  [facility]  PHYSICIAN:  Santiago Bumpers. Hensel, M.D.DATE OF BIRTH:  [birth year]  DATE OF ADMISSION:  06/05/2010 DATE OF DISCHARGE:  06/13/2010                              DISCHARGE SUMMARY   PRIMARY CARE PROVIDER:  Dr. Lerry Liner.  VASCULAR SURGEON:  [name] Fields, MD at [facility].  DISCHARGE DIAGNOSES: 1. Left lower extremity cellulitis. 2. Status post popliteal bypass on May 27, 2010. 3. Pulmonary vascular disease. 4. Hyperlipidemia. 5. Tobacco abuse. 6. Type 2 diabetes. 7. Grade D esophagitis.  New medications on discharge include: 1. Amitriptyline 50 mg p.o. nightly. 2. Keflex 500 mg p.o. b.i.d. 3. Percocet 5/325 one to two tablets p.o. q.6 h. p.r.n. pain. 4. Protonix 40 mg p.o. b.i.d. x1 month. 5. Carafate 1 g p.o. 4 times a day before meals and at bedtime.  Medications which were stopped or changed include: 1. Detrol 4 mg p.o. daily. 2. Prevacid 30 mg p.o. daily. 3. Amitriptyline 100 mg p.o. nightly. 4. Oxycodone 5 mg IR tablet 1-2 tablets p.o. q.6 h. p.r.n. 5. Cipro 500 mg p.o. b.i.d.  Home medications which remain the same include: 1. Cyclobenzaprine 5 mg p.o. b.i.d. 2. Lantus 60 units subcu nightly. 3. Lorazepam 1 mg p.o. t.i.d. 4. Metformin 500 mg p.o. b.i.d. 5. Multivitamin daily. 6. NovoLog sliding scale before meals. 7. Pravastatin 30 mg p.o. nightly. 8. Synthroid 75 mcg p.o. daily.  CONSULTS: 1. Vascular Surgery. 2. Gastroenterology.  PROCEDURES:  EGD on June 13, 2010, showing grade D esophagitis, no other abnormalities noted in the upper GI tract.  LABORATORY DATA:  On the day of admission, the patient's CBC showed a white count of 10.6, hemoglobin of 11.2, platelets of 547.  Prior to discharge, the patient's white blood cell count had decreased to  9.7, hemoglobin to 9.0.  On the day of discharge, the patient's BMET was unremarkable as it was on day of discharge, creatinine on day of discharge was 0.77.  The patient's cardiac enzymes were negative x2. Hemoglobin A1c was 7.5, triglycerides on admission was 198.  Blood cultures were negative.  BRIEF HOSPITAL COURSE:  This is a 65 year old female with past medical history significant for type 2 diabetes, PVD, HLD, presenting with a left lower extremity cellulitis at popliteal bypass site which was seen in her vascular surgery clinic followup whose course was complicated by nausea, vomiting, and dysphasia. 1. Left lower extremity cellulitis.  The patient remained afebrile     throughout her hospital course with a mild white count which     decreased after starting antibiotics.  The patient was initially     placed on IV vancomycin and ceftriaxone.  She was easily     transitioned to Keflex.  Vascular Surgery was consulted and felt     that the area of erythema and edema were greatly improving on antibiotics.  In addition, they did put some packing into one of     the patient's incision sites which appeared to be not healing  as     well.  The patient also had a relatively large hematoma over the     graft site.  However, per Vascular Surgery, there was no     intervention needed at this time, it would just be monitored while     the patient was on antibiotics and if it were to become fulminantly     infected, than they would then consider some sort of treatment.     Prior to discharge, the patient's area of cellulitis had greatly     improved and there was minimal-to-no surrounding erythema around     her incision site.  In addition, the patient's pain at the area had     greatly improved prior to discharge.  The patient was sent home     with Home Health Wound Care and will follow up with Vascular     Surgery on an outpatient basis. 2. Dysphasia.  The patient with complaints of  nausea, vomiting, and     feelings of food get stuck in her throat.  This was another reason     which she was admitted from a vascular surgery clinic on June 05, 2010, as she was unable to keep down her antibiotics or p.o. pain     medications.  The patient did have episodes of nausea and vomiting     throughout her hospital course, however, the patient had had no     emesis times greater than 48 hours prior to the day of discharge.     Since the patient did endorse this questionable history of     dysphasia, GI was consulted and EGD was performed which showed only     esophagitis.  Per GI, the recommendations were high-dose Protonix     40 mg p.o. b.i.d. x1 month and starting Carafate 1 g p.o. q.i.d.     GI did not find any mass or other concerning findings on EGD and     felt that GERD treatment would be sufficient.  Initially before GI     was consulted, the patient was found to be on multiple     anticholinergic medications.  The patient's Detrol was discontinued     and amitriptyline was cut in half from 100 mg to 50 mg daily,     however, this did not appear to have any effect on the patient's     nausea, vomiting, and difficulty swallowing. 3. Lower extremity edema.  The patient initially had bilateral lower     extremity edema, however, was significantly worse in the left     extremity than in the right.  This is likely due to venous and     lymphatic flow obstruction due to the patient's recent popliteal     bypass surgery.  However, lower extremity Dopplers were obtained to     rule out DVT.  There was no evidence of DVT and prior to the day of     discharge, there was a large fluid collection around the harvest     site noted on Doppler. 4. Type 2 diabetes.  The patient had some episodes of hypoglycemia     which most often occurred while the patient was made n.p.o. or on     liquid diet due to her nausea, vomiting.  The patient's Lantus was     decreased while in-house,  however, the patient was told to increase     back to her regular  home dose when she was taking a regular p.o.     diet.  The patient's A1c was elevated on admission to 7.5, however,     we will allow her primary care provider to make any necessary     adjustments. 5. Hyperlipidemia.  The patient was continued on her home medications     for hyperlipidemia. 6. Hypothyroidism.  The patient was continued on her home Synthroid     while in-house. 7. Hyperlipidemia.  The patient was continued on her home statin. 8. Tobacco abuse.  The patient states that she has stopped using     tobacco during her last hospitalization when her popliteal bypass     surgery was done.  She stated that she no longer smoked and had no     nicotine patch requirement while hospitalized.  DISCHARGE INSTRUCTIONS:  The patient was instructed to increase activity slowly and walk with assistance while still having pain in her left lower extremity.  She was also instructed to continue a low-sodium, heart-healthy, carb consistent diet and to continue dressing changes by Home Health Wound Care as explained by Vascular Surgery.  FOLLOWUP APPOINTMENTS:  The patient is to follow up with Dr. Darrick Penna, the patient is to call for followup appointment, however, will likely be in approximately 2 weeks.  The patient is also to follow up with her primary care provider, Dr. Lerry Liner, especially if she is not able to be seen by Vascular Surgery within the next 1-2 weeks.  DISCHARGE CONDITION:  The patient was discharged home in stable medical condition with Home Health for wound care.    ______________________________ Demetria Pore, MD   ______________________________ Santiago Bumpers. Leveda Anna, M.D.    JM/MEDQ  D:  06/13/2010  T:  06/14/2010  Job:  [id number]  cc:   [name] Darrick Penna, MD Dr. Lerry Liner  Electronically Signed by Demetria Pore MD on 06/15/2010 10:30:08 PM Electronically Signed by Doralee Albino  M.D. on 06/16/2010 11:14:40 AM

## 2010-06-16 NOTE — Discharge Summary (Signed)
NAME:  April Branch, April Branch           ACCOUNT NO.:  [id number]  MEDICAL RECORD NO.:  [id number]           PATIENT TYPE:  I  LOCATION:  2031                         FACILITY:  [facility]  PHYSICIAN:  [name] Yaneisy Wenz, MD  DATE OF BIRTH:  1945-04-17  DATE OF ADMISSION:  05/27/2010 DATE OF DISCHARGE:  06/01/2010                              DISCHARGE SUMMARY   ADMISSION DIAGNOSIS:  Left lower extremity claudication.  PAST MEDICAL HISTORY AND DISCHARGE DIAGNOSES: 1. Left lower extremity claudication, status post left femoral to     above-knee popliteal bypass with greater saphenous vein. 2. Diabetes mellitus. 3. Hiatal hernia with gastroesophageal reflux disease. 4. History of tobacco abuse. 5. Anxiety.  BRIEF HISTORY:  The patient is a Caucasian female who has been followed by Dr. Darrick Penna as an outpatient.  She had undergone aortogram with bilateral lower extremity runoff secondary to a complaint of lower extremity claudication.  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  Secondary to these findings and the patient's symptoms it was felt she should proceed with left femoral popliteal bypass.  HOSPITAL COURSE:  The patient was admitted and taken to the OR on May 27, 2010 for left femoral to above-knee popliteal bypass using non- reverse greater saphenous vein.  The patient tolerated the procedure and was hemodynamically stable immediately postoperatively.  She was transferred from the OR to the postanesthesia care unit in stable condition.  The patient was extubated without complication and woke up from anesthesia neurologically intact.  The patient's postoperative course was complicated by low-grade fevers. No definitive source was found; however, her temperatures did trend down early in the postoperative course and she had been started on Avelox. This was changed to Cipro prior to discharge.  Her white  count normalized and she was continued on an antibiotic as an inpatient.  She also had some postoperative nausea and vomiting.  The patient state that her acid reflux contributed to nausea on a chronic basis.  She was maintained on anti-reflux medications and this did help.  She was tolerating regular diet prior to discharge.  The patient had received physical therapy and did well with this.  The patient's vital signs remained stable other than her fevers throughout the hospital course.  Again, a UA and a chest x-ray were negative.  On June 01, 2010 she was without complaint.  She was tolerating regular diet and ambulating well.  PHYSICAL EXAMINATION:  CARDIAC:  Regular rate and rhythm. LUNGS:  Clear to auscultation.  INCISION: had minimal serous drainage with no induration and no erythema. EXTREMITIES:  Bilateral lower extremities were warm and well perfused.  The patient was doing well and was discharged home  with home health RN to monitor her incisions.  LABORATORY DATA:  CBC and BMP on June 01, 2010, white count 9.7, hemoglobin 9.3, hematocrit 28.7, platelets 289.  Sodium 136, potassium 3.8, BUN 6, creatinine 0.82.  DISCHARGE INSTRUCTIONS:  The patient received specific written discharge instructions regarding diet, activit,y and wound care.  She is to follow up with Dr. Darrick Penna approximately 1 week after discharge.  DISCHARGE MEDICATIONS: 1. Multivitamin OTC daily. 2. Lantus 60 units subcu at bedtime. 3. NovoLog 45 units subcu t.i.d. before meals. 4. Amitriptyline 100 mg at bedtime. 5. Lansoprazole 30 mg daily. 6. Pravastatin 30 mg at bedtime. 7. Lorazepam 1 mg t.i.d. 8. Synthroid 75 mcg daily. 9. Cyclobenzaprine 5 mg b.i.d. 10.Metformin 500 mg b.i.d. 11.Detrol LA 4 mg daily.     Pecola Leisure, PA   ______________________________ Janetta Hora Javani Spratt, MD    AY/MEDQ  D:  06/10/2010  T:  06/11/2010  Job:  [id number]  Electronically Signed by Pecola Leisure PA  on 06/12/2010 01:10:56 PM Electronically Signed by Fabienne Bruns MD on 06/16/2010 11:07:07 AM

## 2010-06-19 ENCOUNTER — Ambulatory Visit (INDEPENDENT_AMBULATORY_CARE_PROVIDER_SITE_OTHER): Payer: Medicare Other

## 2010-06-19 DIAGNOSIS — T8189XA Other complications of procedures, not elsewhere classified, initial encounter: Secondary | ICD-10-CM

## 2010-06-19 DIAGNOSIS — I70219 Atherosclerosis of native arteries of extremities with intermittent claudication, unspecified extremity: Secondary | ICD-10-CM

## 2010-06-20 NOTE — Assessment & Plan Note (Signed)
OFFICE VISIT  April, Branch DOB:  07/06/1945                                       06/19/2010 [id number]#:[id number]  CHIEF COMPLAINT:  Follow-up open wounds, left lower extremity, status post left fem-pop bypass.  HISTORY OF PRESENT ILLNESS:  Patient is a 65 year old woman who had a left fem-pop bypass done by Dr. Darrick Penna on 05/27/2010.  She had some areas of cellulitis of her wounds during her last visit.  However, she also had severe nausea and vomiting.  She was admitted to the hospital under the medicine service and started on antibiotics.  She has a history of MRSA in the past.  However, a nasal swab during this admission was negative.  She was sent home on Keflex.  Her wounds were healing well.  She notes some increased pain in the groin area but that otherwise the wounds were all stable.  PHYSICAL EXAMINATION:  This is a well-developed, well-nourished woman in no acute distress.  Her sats were 98%.  Her heart rate was 80.  Her respiratory rate was 12.  Left lower extremity was warm and pink with palpable DP pulse.  The proximal groin wound was healing with some area of superficial dehiscence, and some sutures were cut in this area.  The wound was very shallow.  The other wounds were healing well.  She had in the proximal wound a small opening which was packed with iodoform gauze. This tracked approximately 3 cm from the wound underneath a flap.  There was no drainage noted.  There was no cellulitis noted in this area.  The patient has been doing wound care at home.  ASSESSMENT/PLAN:  Open wound, status post left femoropopliteal bypass, healing well without cellulitis.  Continue on Keflex.  The patient was given a prescription for 30 Percocet as the Vicodin she was given last Monday is not being helpful.  She is not tolerating it well.  She will follow up with Dr. Darrick Penna in 2 weeks for wound care.  She will continue wound care to the left lower  extremity.  She should paint her groin wound with Betadine twice a day.  Della Goo, PA-C  Charles E. Fields, MD Electronically Signed  RR/MEDQ  D:  06/19/2010  T:  06/20/2010  Job:  [id number]

## 2010-07-03 ENCOUNTER — Encounter (INDEPENDENT_AMBULATORY_CARE_PROVIDER_SITE_OTHER): Payer: Medicare Other

## 2010-07-03 ENCOUNTER — Ambulatory Visit (INDEPENDENT_AMBULATORY_CARE_PROVIDER_SITE_OTHER): Payer: Medicare Other | Admitting: Vascular Surgery

## 2010-07-03 DIAGNOSIS — I70219 Atherosclerosis of native arteries of extremities with intermittent claudication, unspecified extremity: Secondary | ICD-10-CM

## 2010-07-03 DIAGNOSIS — I739 Peripheral vascular disease, unspecified: Secondary | ICD-10-CM

## 2010-07-03 DIAGNOSIS — Z48812 Encounter for surgical aftercare following surgery on the circulatory system: Secondary | ICD-10-CM

## 2010-07-04 NOTE — Assessment & Plan Note (Signed)
OFFICE VISIT  TAWNYA, PUJOL DOB:  1945/09/17                                       07/03/2010 [id number]#:[id number]  The patient returns for followup today.  She previously underwent a left femoral to above knee popliteal bypass using a vein graft on April 3. She had some intermittent nausea and vomiting postoperatively and actually required readmission and evaluation of this.  She also had some breakdown of her above knee popliteal wound and is doing local wound care on this.  She also complains of a small amount of drainage from her left groin.  She does not have any claudication symptoms at this time and all of these symptoms have completely resolved.  PHYSICAL EXAM:  Today blood pressure is 167/100 in the left arm, heart rate is 76 and regular.  Temperature is 97.6, respirations 24.  Left lower extremity she has a small amount of serous drainage from the left groin.  There is no surrounding erythema.  There is no obvious abscess. She has a 2 mm opening at the left knee area.  There is no drainage from this.  There is good granulation tissue at the base.  Overall I believe the patient is doing well.  Her wounds continue to heal.  We will see her again in 2 weeks' time to further evaluate her wounds.  She is scheduled for a noninvasive vascular exam in July to make sure she has no narrowing of her bypass.  She was given a prescription for Percocet today #30 dispensed.  I do not believe she will require further pain medication after this.  We will review her wounds again in 2 weeks' time.  She had noninvasive vascular exam today which I reviewed and interpreted.  Her ABIs currently are greater than 1 bilaterally.  She had triphasic waveforms on the left, biphasic on the right.    [name] Naama Sappington, MD Electronically Signed  CEF/MEDQ  D:  07/03/2010  T:  07/04/2010  Job:  [id number]  cc:   Maurice March, M.D. Noralyn Pick. Eden Emms, MD, [facility]

## 2010-07-08 NOTE — Discharge Summary (Signed)
NAME:  April Branch, April Branch           ACCOUNT NO.:  [id number]   MEDICAL RECORD NO.:  [id number]          PATIENT TYPE:  INP   LOCATION:  2036                         FACILITY:  [facility]   PHYSICIAN:  Marcellus Scott, MD     DATE OF BIRTH:  [birth year]   DATE OF ADMISSION:  10/22/2007  DATE OF DISCHARGE:  10/25/2007                               DISCHARGE SUMMARY   PRIMARY MEDICAL DOCTOR:  Dr. Dow Adolph at [facility].   PRIMARY CARDIOLOGIST:  Dr. Charlton Haws.   DISCHARGE DIAGNOSES:  1. Atypical chest pain.  2. Type 2 diabetes mellitus.  3. Transient hypotension - resolved.  4. Chronic anemia - stable.  5. Coronary artery disease, negative stress test this admission.  6. Hypertension, fairly controlled.  7. Hypothyroidism.  8. Gastroesophageal reflux disease.  9. Chronic benzodiazepine use.  10.Tobacco abuse.  11.Anxiety/depression.  12.History of renal cell carcinoma, surgically excised.  13.Right hip bursitis.   DISCHARGE MEDICATIONS:  Essentially the same as the patient was taking  prior to admission and aspirin.  1. Lantus 50 units subcutaneously q.h.s.  2. Prevacid 30 mg p.o. daily.  3. Detrol 4 mg p.o. daily.  4. Lorazepam 1 mg p.o. t.i.d.  5. Amitriptyline 100 mg p.o. q.h.s.  6. Lipitor 10 mg p.o. q.h.s.  7. Vitamin D 125 mg one p.o. q. weekly.  8. Synthroid 100 mcg p.o. daily.  9. Diclofenac sodium 75 mg p.o. b.i.d.  10.Over-the-counter iron tablet p.o. daily.  11.Flexeril 5 mg p.o. b.i.d.  12.Niacin 500 mg p.o. daily.  13.Enteric-coated aspirin 81 mg p.o. daily.   PROCEDURES:  1. Dobutamine stress test;  Impression:  Normal stress nuclear scan.      No scar or ischemia.  2. Chest x-ray on October 23, 2007;  Impression:  Right IJ catheter in      place without complicating features.  No new abnormality.  3. Chest x-ray on October 22, 2007;  Impression:  Cardiomegaly with      chronic lung disease.  No acute abnormality.  4. 2-D echocardiogram on October 24, 2007; Summary:  Cannot evaluate      regional wall motion as endocardium is difficult to see.  Overall      left ventricular systolic function was normal.  Left ventricular      ejection fraction with estimated range being 55 to 60%.   PERTINENT LABS:  Basic metabolic panel with BUN 8, creatinine 0.69,  glucose 123.  CBC with hemoglobin 11, hematocrit 32.2, white blood cell  7.5, platelets 269.  Urine microscopy negative for protein or features  of urinary tract infection.  a.m. cortisol 13.5.  Cardiac panel cycled  x3 and negative.  Lipid panel remarkable for HDL of 25, LDL 55.  Hemoglobin A1c of 8.5, TSH of 3.672. BNP of 32.   CONSULTATIONS:  Cardiology, Dr. Diona Browner.   HOSPITAL COURSE/PATIENT DISPOSITION:  Please refer to the History and  Physical note for initial admission details.  In summary, April Branch  is a very pleasant 65 year old Caucasian female patient with history of  type 2 diabetes, hypertension, CAD, who presented with chest pain on and  off for 3 weeks, which got worse the night prior to admission.  This  pain started on the right ear and travels from the right ear down her  neck to her chest.  She also complained of some dyspnea, lightheadedness  and weakness.  She was thereby admitted with a diagnosis of atypical  chest pain for further evaluation and management.  1. Atypical chest pain:  The patient was admitted to the step-down      unit because of transient hypotension.  There were no significant      cardiac arrhythmias on telemetry.  Her cardiac enzymes were cycled      and negative.  Cardiology was consulted and followed the patient.      They proceeded to do a dobutamine stress test today which was      negative.  They have cleared the patient for discharge from a      cardiac standpoint.  2. Transient hypotension, the etiology of which is not clear:  This,      however, occurred after Nitro paste was applied.  The patient was      resuscitated with IV  fluid and was even on IV dopamine for a few      hours.  This was gradually tapered and discontinued.  The patient      has been normotensive for more than 36 hours. There was no clinical      focus of sepsis or bleeding.  3. Type 2 diabetes:  Obviously, this has not been adequately      controlled as an outpatient.  Consider adjusting her medications as      an outpatient.  4. Chronic anemia which is stable:  Consider outpatient GI workup if      not already performed.  5. Coronary artery disease.  Plan per #1.  6. Hypertension:  Consider adding low dose ACE inhibitor given her      history of diabetes as an outpatient, as tolerated.  7. Hypothyroidism:  Clinically and biochemically euthyroid.  8. Chronic benzodiazepine use:  Consider gradually tapering her if no      clear indication.  9. Tobacco cessation: has been counseled.  10.Right hip bursitis:  The patient has been advised to take      diclofenac on a p.r.n. basis to avoid toxic effects on her stomach      and kidneys.   The patient has been advised to follow-up with her PMD in 1-2 weeks.      Marcellus Scott, MD  Electronically Signed     AH/MEDQ  D:  10/25/2007  T:  10/25/2007  Job:  [id number]   cc:   Maurice March, M.D.  Noralyn Pick. Eden Emms, MD, [facility]

## 2010-07-08 NOTE — Op Note (Signed)
NAME:  April Branch, April Branch           ACCOUNT NO.:  [id number]   MEDICAL RECORD NO.:  [id number]          PATIENT TYPE:  AMB   LOCATION:  DAY                          FACILITY:  [facility]   PHYSICIAN:  Bertram Millard. Dahlstedt, M.D.DATE OF BIRTH:  09-08-1945   DATE OF PROCEDURE:  08/29/2006  DATE OF DISCHARGE:                               OPERATIVE REPORT   PREOPERATIVE DIAGNOSIS:  Left renal mass.   POSTOPERATIVE DIAGNOSIS:  Left renal mass.   PROCEDURE:  Laparoscopic directed biopsy of cryoablation of left renal  mass.   SURGEON:  Bertram Millard. Dahlstedt, M.D.   FIRST ASSISTANT:   ASSISTANTS:  Heloise Purpura, MD, Melina Schools, MD.   ANESTHESIA:  General endotracheal.   COMPLICATIONS:  None.   SPECIMEN:  Core of left renal mass to pathology.   BRIEF HISTORY:  This 65 year old female presents at this time for  minimally invasive therapy of a left renal mass.  She was found to have  an endophytic mass on the left kidney after CT scan was done and the  past to rule out any intra-abdominal process after an anterior and  posterior repair.  The mass was found to be enhancing, approximately 1.5  cm in size.  She originally presented to Dr. Brunilda Payor, and subsequently  presented to me for consideration of minimally invasive therapy of this  small mass.   On metastatic evaluation the patient has been negative.  We presented  with management of this renal mass to the patient.  This will include  active surveillance, laparoscopic nephrectomy, laparoscopic partial  nephrectomy, RFA and cryotherapy.  As the mass is somewhat small and  certainly endophytic, excision of the tumor itself would be quite  difficult.  The patient has chosen to have laparoscopic directed  cryoablation.  She is aware of risks and complications of the procedure.  These include but are not limited to bleeding, loss of the kidney, total  nephrectomy, needing to convert to an open procedure, as injury to  surrounding organs,  and recurrence of the tumor.  She understands these  and desires to proceed.   DESCRIPTION OF PROCEDURE:  The patient was administered preoperative IV  antibiotics.  The side her body was marked on the left side to  correspond with surgery on the left kidney.  She is taken to the  operating room where general anesthetic was administered using  endotracheal apparatus.  She was placed in left side up position.  The  table was flexed, and an axillary roll was placed.  The beanbag was used  to provide adequate support of the patient.  Her entire abdomen was then  prepped and draped.  A Foley catheter had been placed.  A Vi-drape was  placed.  Infraumbilically, a 12 mm incision was made on carried down  through adipose tissue down to the fascia.  The fascia was divided, and  using blunt and sharp dissection, access to the peritoneum was gained.  Stay sutures of 0-0 Vicryl were placed in the fascia.  Hassan cannula  was placed.  Pneumoperitoneum was established.  Two other laparoscopic  port sites were placed, one in  the upper abdomen just to the left of the  midline and one in the left lower quadrant slightly inferior and lateral  to the umbilical incision.  The upper abdominal port site was 5 mm,  lower quadrant port site was 12 mm.  There were some mild adhesions  anteriorly of the abdominal wall.  These were taken down the harmonic  scalpel.  The white line of Toldt was then divided with harmonic  scalpel, and splenic flexure was divided, allowing the entire colon to  swing medially, easily gotten out of the way with tilting the table.  The anterior surface of the Gerota's fascia was then identified.  The  entire anterior surface was dissected free.  We then opened Gerota's  fascia anteriorly, and easily peeled the fat off of the kidney.  The  entire anterior and lateral and posterior surface of the kidney was  freed of this perinephric fat.  The ureter was identified and swept  medially.   At this point, there was a small exophytic nature to the  tumor which measured 1.7 cm using ultrasound help.  We could identify  the tumor with the laparoscopic the ultrasound probe both placed  posterior and anterior to the kidney.  Once the kidney had been easily  freed so that we could access it from the flank with the cryo needles,  the Prestige dissector was used to clothesline underneath the kidney,  holding it up more anteriorly.  This would allow [place] to easily passed the  cryo probes then.   At this point, two Thermosensors were placed 180 degrees on each side of  the tumor, 5 mm from the edge of the tumor.  These were placed to a  depth of 5 mm.  The three cryo needles were then placed to 2.5  centimeters into the renal tumor.  These were placed at the 12, 4 and 8  o'clock positions, just inside the tumorous margin.  We used an  ultrasound probe placed anteriorly to verify adequate needle location.   At this point two biopsies were taken with an 18 and then one biopsy  taken with a 14 gauge Tru-Cut needle.  There was a good core taken with  a 14 gauge.  At this point we started the freeze cycle.  We froze for 5  minutes for the first cycle.  We saw an excellent hypoechoic zone  encompassing normal renal parenchyma using the ultrasound probe.  Thermosensors both got to approximately negative 20 degrees Celsius.  After 5 minutes we started an active thaw.  Once the temperature of the  Thermosensors raised to 30 degrees Celsius, we started another active  freeze.  This again went on for 5 minutes, with Thermosensors getting  down to negative 20 degrees Celsius.  A passive thaw was then performed.  After 2 minutes we were able to remove the Thermosensors.  There was no  bleeding at these sites.  After another 3 minutes we were able to remove  the cryo probes.  There was minimal oozing at this point.  We then  placed FloSeal over top of the biopsy, Thermosensor and cryoprobe   positions.  There was adequate hemostasis at this point.  We then  allowed the kidney to fall back posteriorly once we removed the prestige  clamp and flattened the table.  The colon was replaced over top of the  kidney.  The two 12-mm port sites were closed using a suture passer, and  zero Vicryl suture.  There was excellent fascial closure.  Skin  incisions were then closed with staples.  20 mL of quarter percent plain  Marcaine was used to infiltrate the area where the cryo probes went  through the flank.   This point the patient was awakened, extubated and taken to PACU in  stable condition.      Bertram Millard. Dahlstedt, M.D.  Electronically Signed     SMD/MEDQ  D:  08/30/2006  T:  08/30/2006  Job:  [id number]   cc:   Osborn Coho, M.D.  Fax: [phone]

## 2010-07-08 NOTE — Assessment & Plan Note (Signed)
OFFICE VISIT   April Branch, April Branch  DOB:  1945-04-13                                       02/06/2010  ZOXWR#:[id number]   The patient is a 65 year old female last seen in May of 2010.  We have  followed her for lower extremity claudication symptoms.  She recently  had surveillance ABIs which showed that she had had some decline in her  ABIs over time and she has also had increased lower extremity symptoms  on the left side.  She states that her left leg hurts all the time.  However, she states that the right leg swells more than her left leg.  Her left leg also hurts in the calf after walking approximately one  block.  Recent ABIs were reviewed which shows a decline in the ABI on  the left side from 0.8 to 0.6.  Unfortunately she continues to smoke  half pack of cigarettes per day.  She has had significant stress in her  life as her niece was recently involved in a multiple murder and then  committed suicide.   CHRONIC MEDICAL PROBLEMS:  Include elevated cholesterol, diabetes,  coronary artery disease.  She had a myocardial infarction 20 years ago.  She has also had diabetes for 20 years.  Currently these problems are  controlled.  Smoking cessation was discussed with the patient again  today for greater than 3 minutes.   FAMILY HISTORY:  Unremarkable for early vascular disease or abdominal  aortic aneurysm repair.   PAST SURGICAL HISTORY:  She has previously had a lung nodule removed  from the right side.  She had a left partial nephrectomy for cancer 2  years ago.  She has had appendectomy, cholecystectomy, left knee  operation and hysterectomy.   MEDICATIONS:  1. Prevacid 30 mg once a day.  2. Synthroid 125 mcg once a day.  3. Lorazepam 1 mg t.i.d.  4. Amitriptyline 100 mg q.h.s.  5. Flexeril 5 mg twice a day.  6. Detrol LA 4 mg once a day.  7. Lantus insulin 60 units once a day.  8. NovoLog sliding scale.   REVIEW OF SYSTEMS:  She denies any  recent weight loss or gain.  Other  review of systems are negative.   ALLERGIES:  She has allergy listed to Simcor.   PHYSICAL EXAM:  Vital signs:  Blood pressure 122/79 in the left arm,  heart rate is 89 and regular.  Temperature is 97.9.  Lower extremities:  She has 2+ femoral pulses bilaterally.  She has absent popliteal and  pedal pulses.  Cardiac:  Regular rate and rhythm without murmur.   ABIs from 01/22/2010 were reviewed.  This shows an ABI on the left side  of 0.69 with monophasic waveforms distally.  She had an ABI on the right  side of 1.06 with biphasic to triphasic waveforms in the right leg.   In summary, the patient has had a decline in her ABIs and her symptoms  seem to have worsened over time.  However, she has recent traumatic  event in her life with her niece's suicide.  She does not have limb  threatening ischemia at this time and has mainly claudication type  symptoms.  I believe the best option at this point is to let her get  over some of the other events occurring in her life  and we will  reevaluate her in three months' time.  If she is still experiencing what  she considers to be debilitating symptoms of her left lower extremity we  will consider whether an intervention would be necessary.  We did  discuss with her today though that she currently does not have limb  threatening ischemia and if she is able to quit smoking her risk of limb  loss lifetime should be less than 5%.  I did discuss with her that any  intervention could possibly increase that risk of limb loss over time.  She will think about this and will discuss this further at her next  office visit in three months' time.     [name] Fields, MD  Electronically Signed   CEF/MEDQ  D:  02/07/2010  T:  02/07/2010  Job:  [id number]   cc:   Noralyn Pick. Eden Emms, MD, [facility]  Maurice March, M.D.

## 2010-07-08 NOTE — Consult Note (Signed)
NAME:  April Branch, April Branch NO.:  [id number]   MEDICAL RECORD NO.:  [id number]          PATIENT TYPE:  INP   LOCATION:  2909                         FACILITY:  [facility]   PHYSICIAN:  Jonelle Sidle, MD DATE OF BIRTH:  [birth year]   DATE OF CONSULTATION:  DATE OF DISCHARGE:                                 CONSULTATION   REQUESTING SERVICE:  [facility] B Team.   PRIMARY CARDIOLOGIST:  Noralyn Pick. Eden Emms, MD, [facility]: Lambert Rhonda W   REASON FOR CONSULTATION:  Chest discomfort.   HISTORY OF PRESENT ILLNESS:  Ms. April Branch is a 65 year old woman with a  history of hypertension, type 2 diabetes mellitus, tobacco abuse, and no  clearly documented history of obstructive coronary artery disease.  She  last saw Dr. Eden Emms back in April 2008.  In reviewing his note, there is  no discussion of any prior history of obstructive coronary artery  disease or myocardial infarction and, in fact, referral to a diagnostic  cardiac catheterization done by Dr. Fraser Din back in 2005 which revealed  normal coronary arteries.  She is now admitted to the hospital  describing a 2-week history of intermittent chest discomfort.  Specifically, she describes a throbbing sensation that typically  begins around the region of her right ear and jaw and then extends down  into her chest.  This seems to be fairly sporadic lasting anywhere from  1 minute to 10 minutes.  It has not been responsive to nitroglycerin and  has not been provoked by exertion.  She had a prolonged episode  prompting her evaluation by emergency medical services and subsequently  admission to the hospital.  I note that she became relatively  hypotensive after being placed on nitroglycerin paste in the emergency  department.  There was some difficulty with intravenous access as well  and she now has a right internal jugular vein catheter.  She  has been  placed on fluids and  dopamine temporarily and her nitroglycerin paste  was discontinued.  She denies  any active symptoms at this time.  Her  initial point of care cardiac markers are normal and, in reviewing her  electrocardiogram, she has nonspecific changes, specifically sinus  rhythm with a small R prime in lead V1 and nonspecific ST-T wave  changes.  Her chest x-ray demonstrates no pneumothorax with chronic  interstitial lung changes and overall normal heart size without effusion  or edema.   ALLERGIES:  NO KNOWN DRUG ALLERGIES.   MEDICATIONS:  At this time include:  1. Aspirin 325 mg p.o. daily.  2. Dopamine infusion.  3. Lantus 50 units subcu q.h.s.  4. Ativan 1 mg p.o. t.i.d.  5. Protonix 40 mg IV daily.  6. Senokot S 1 tablet p.o. q.h.s.   HOME MEDICATIONS:  Also include:  1. Detrol 4 mg p.o. daily.  2. Lipitor 10 mg p.o. q.h.s.  3. Vitamin D 125 mg p.o. weekly.  4. Synthroid 100 mg p.o. daily.  5. Flexeril 5 mg p.o. b.i.d.  6. Niacin 5 mg p.o. daily.  7. NovoLog sliding scale.  8. Hydrocodone 7.5/500 mg p.o. q. 6 hours p.r.n.   PAST MEDICAL  HISTORY:  In addition to that outlined above, includes:  1. Depression, previous hysterectomy.  2. Previous cholecystectomy.  3. Left shoulder surgery due to bone spurs.  4. Laparoscopic biopsy and cryoablation of a left renal mass.  5. Gastroesophageal reflux disease.  6. Hypothyroidism.  7. Renal cell carcinoma with surgical excision.  8. 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  9. Lumbar degenerative joint disease.  10.Right lower extremity deep venous thrombosis.  11.Migraine headaches.  12.Right hip bursitis.   SOCIAL HISTORY:  The patient lives at home with her son.  She has an  ongoing tobacco use history of at least one-pack per day since the age  of 78.  Denies any alcohol or illicit substance use.   FAMILY HISTORY:  Reviewed and is significant for cerebrovascular  disease, rheumatoid arthritis, but no premature cardiovascular disease.  Her mother and father both died of some type of cancer.    REVIEW OF SYSTEMS:  The patient complains of a dry mild cough.  No  fevers or chills.  No hemoptysis.  She has had no exertional chest pain.  No orthopnea or PND.  She has had some dizziness recently sometimes  when she stands up.  Arthritic and back pain noted.  Appetite has been  normal.  She has had some problems with diverticulitis.  Otherwise,  systems are negative.   EXAMINATION:  Blood pressure is 101/61, ox saturation is 96% on 2 liters  nasal cannula, heart rate 82 and sinus rhythm, respirations 25.  The patient is in no acute distress.  HEENT:  Conjunctivae are normal.  Pharynx clear.  Poor dentition  NECK:  Supple.  No elevated venous pressure.  A right internal jugular  venous catheter is in place.  No thyromegaly.  LUNGS:  With diminished breath sounds throughout.  No wheezing or  labored breathing.  CARDIAC:  Exam reveals a regular rate and rhythm.  No pericardial rub or  S3 gallop.  Soft S4 is noted.  ABDOMEN:  Soft, nontender, obese, normoactive bowel sounds.  EXTREMITIES:  Exhibit trace edema, nonpitting distal pulses are 1+.  SKIN:  Warm and dry.  MUSCULOSKELETAL:  No kyphosis noted.  NEUROPSYCHIATRIC:  The patient is alert x3.  Affect is appropriate.   LABORATORY DATA:  WBC is 8.5, hemoglobin 13.1, hematocrit 38.4,  platelets 342.  INR 1.0.  Sodium 139, potassium 3.5, BUN 11, creatinine  0.7 initial troponin I of 0.01, TSH 3.67.   IMPRESSION:  1. Presentation with atypical chest pain associated with right jaw/ear      discomfort.  This has been sporadic and nonexertional.      Electrocardiogram is nonspecific at this time and initial cardiac      markers are normal.  2. No clearly documented history of obstructive coronary artery      disease based on available information.  When she last saw Dr.      Eden Emms, there is mention of a normal cardiac catheterization from      2005.  She has not had any follow-up ischemic testing since that      time and was in fact  already scheduled to see Dr. Eden Emms in the      office tomorrow for follow-up given her recent symptoms.  3. Type 2 diabetes mellitus.  4. Hyperlipidemia  5. History of gastroesophageal reflux disease.  6. Hypotension following nitroglycerin paste, probably nonspecific.      At the present time, she is receiving intravenous fluids and was  actually placed on dopamine.  This is being weaned.  She is not      reporting any active chest pain with this.   RECOMMENDATIONS:  Agree with admission for observation.  Would continue  to wean dopamine and not replace nitroglycerin paste.  She should have a  full set of cardiac markers as well as a follow-up electrocardiogram.  I  see that a 2-D echocardiogram has already been ordered and we will plan  to follow up on this.  Her symptoms are rather atypical, although in the  setting of diabetes mellitus and a substantial history of tobacco abuse.  In light of the fact that her last ischemic evaluation was in 2005; at a  minimum I would anticipate she will need a follow-up Myoview assuming  she remains clinically stable and her markers are normal.  Otherwise, a  cardiac catheterization can be considered.  Our service will follow with  you.      Jonelle Sidle, MD  Electronically Signed     SGM/MEDQ  D:  10/23/2007  T:  10/23/2007  Job:  [id number]   cc:   Noralyn Pick. Eden Emms, MD, [facility]  [facility]

## 2010-07-08 NOTE — Assessment & Plan Note (Signed)
[facility]                            CARDIOLOGY OFFICE NOTE   NAME:Loge, ALEGRA ROST                  MRN:          [id number]  DATE:11/25/2007                            DOB:          01-11-1946    Ms. Laine is seen today in followup.  Apparently, I have seen her  back in April 2008.   I used to take care of her husband who died of lung cancer last year.  She was recently hospitalized on October 22, 2007, through October 25, 2007.  I do not believe I was involved with her care.  She was seen with  transient hypotension, atypical chest pain, her pain started in her  right ear radiating to the jaw, and down into her central chest area.  She does have a history of reflux.   She is a long-time smoker and insulin-dependent diabetic for 6 years.   She had a 2-D echocardiogram, which I reviewed.  This was done on October 24, 2007.  It was a poor quality study without good visualization of the  endocardium, but there were no definite regional wall motion  abnormalities.  Her EF was 55-60%.  She subsequently had a dobutamine  Myoview.  I also reviewed these images and they were normal without  evidence of significant coronary artery disease.  The patient's symptoms  are atypical.   She may be allergic or have excessive hypotension to nitro.  I told  Carry that after reviewing these studies, I did not think she needed  a heart cath.   Her baseline EKG is also normal.  She continues to be depressed about  her husband's death.  We talked at length about her smoking.  She  understands the risk.  I could not quite understand how she continues to  smoke after her husband died of lung cancer.  However, she does not seem  motivated to quit and does not want to try Wellbutrin or Chantix.   The patient's risk factors are otherwise well modified.  She checks her  sugar on a regular basis and has a short acting and Lantus insulin.   PAST MEDICAL  HISTORY:  Otherwise remarkable for previous surgical  excision of a renal cell carcinoma, chronic benzodiazepine use,  hypertension, chronic anemia, type 2 diabetes.   She is currently taking,  1. NovoLog after meals.  2. Lantus at night.  3. Detrol 4 mg a day.  4. Prevacid 30 a day.  5. Amitriptyline.  6. Ferrous sulfate.  7. Lipitor 10 a day.  8. Niacin.  9. Synthroid 100 mcg a day.   PHYSICAL EXAMINATION:  GENERAL:  Remarkable for bronchitic elderly  female who looks older than her stated age.  VITAL SIGNS:  Weight is 169, blood pressure 126/86, pulse 88 and  regular, respiratory rate 14, afebrile.  HEENT:  Unremarkable.  NECK:  Carotids are normal without bruit.  No lymphadenopathy, no  thyromegaly, no JVP elevation.  LUNGS:  Clear.  Good diaphragmatic motion.  No wheezing.  S1 and S2.  Normal heart sounds.  PMI normal.  ABDOMEN:  Protuberant.  Bowel  sounds positive.  No AAA, no tenderness,  no bruit status post surgery for renal cell carcinoma.  No  hepatosplenomegaly, no hepatojugular reflux.  EXTREMITIES:  Distal pulses are intact.  No edema.  NEURO:  Nonfocal.  SKIN:  Warm and dry.  MUSCULOSKELETAL:  No muscular weakness.   EKG is normal, as indicated dobutamine Myoview and echo reviewed.   IMPRESSION:  1. Chest pain.  Multiple coronary artery risk factors, low-risk      Myoview.  Continue to observe, aspirin a day.  2. Hyperlipidemia.  Continue statin drug.  Lipid and liver profile in      6 months.  3. Chronic obstructive pulmonary disease with continued smoking.  Not      motivated to quit.  Chest x-ray at the hospital did not show any      lesions, but some chronic obstructive pulmonary disease, p.r.n.      albuterol inhaler.  Consider referral to Pulmonary for a      prescription of Advair.  4. History of reflux.  May be related to her atypical chest pain.      Continue Prevacid 30 a day.  5. Anxiety and depression.  Continue p.r.n. benzodiazepines.  6.  Hypothyroidism.  Continue Synthroid 100 mcg a day, TSH, T4 normal      in the hospital.  I will see her back in 6 months.     Noralyn Pick. Eden Emms, MD, [facility]  Electronically Signed    PCN/MedQ  DD: 11/25/2007  DT: 11/26/2007  Job #: [id number]

## 2010-07-08 NOTE — Assessment & Plan Note (Signed)
OFFICE VISIT   April Branch, April Branch  DOB:  1945/03/25                                       06/27/2008  ZOXWR#:[id number]   The patient is a 65 year old female referred by Dr. Audria Nine for  evaluation of lower extremity claudication.  The patient states that  approximately 3 months ago she began experiencing pain in her left calf  similar to a pulled muscle.  She now has symptoms that sound like one  half block claudication.  She denies rest pain.  She has a prior history  of a right leg DVT in 2002.  She states she also has had some pain  posterior to the right knee recently.  She occasionally has some pain in  her left foot at night time but this does not sound like rest pain.   Her atherosclerotic risk factors include coronary artery disease with  myocardial infarction 20 years ago, diabetes for the last 20 years and  elevated cholesterol.  She also smokes a half pack of cigarettes per  day.  Approximately 3 minutes today were spent as part of smoking  cessation counseling.   FAMILY HISTORY:  Unremarkable.   PAST SURGICAL HISTORY:  She had a lung nodule removed on the right side.  She had a left partial nephrectomy for cancer 2 years ago.  She had an  appendectomy, cholecystectomy, left knee operation and a hysterectomy.   MEDICATIONS:  1. Prevacid 30 mg once a day.  2. Synthroid 125 mcg once a day.  3. Lorazepam 1 mg three times a day.  4. Amitriptyline 100 mg two times a day.  5. Diclofenac 70 mg two a day.  6. Flexeril 5 mg two a day.  7. Vitamin D over-the-counter 2000 international units once a day.  8. Iron over-the-counter once a day.  9. Detrol LA 4 mg once a day.  10.Lantus insulin 60 units at night.  11.NovoLog 4-6 units for sliding scale treatment.   ALLERGIES:  She has stated allergic reaction to Simcor which causes  hives.   SOCIAL HISTORY:  She is widowed, has two children.  Smokes half pack of  cigarettes per day.  Does not  consume alcohol regularly.   REVIEW OF SYSTEMS:  She is 5 feet 1 inch, 177 pounds.  She has had some  recent weight gain.  CARDIAC:  She is followed by Dr. Eden Emms who is her cardiologist.  She  occasionally has some chest pain.  PULMONARY:  She becomes short of breath at less than 1 flight of stairs.  She denies history of asthma or wheezing.  VASCULAR:  She had a stroke 1 year ago which left her with some flexion  deficit in her left hand.  ORTHOPEDIC:  She has multiple joint and arthritis pain.  PSYCHIATRIC:  She has some mild anxiety and depression.  HEMATOLOGIC:  She has a history of anemia, undiagnosed etiology.  ENT, neurologic, renal, GI review of systems are all negative.   PHYSICAL EXAMINATION:  Vital signs:  On physical exam blood pressure is  134/75 in the right arm, pulse is 76 and regular.  HEENT:  Unremarkable.  Neck:  Has 2+ carotid pulses without bruit.  Chest:  Clear to  auscultation.  Cardiac:  Exam is regular rate and rhythm without murmur.  Abdomen:  Soft, nontender, nondistended.  No masses.  Extremities:  She  has 2+ radial, 2+ femoral pulses bilaterally.  She has a 2+ right  popliteal pulse.  She has a 2+ right dorsalis pedis and posterior tibial  pulse.  She has absent popliteal and pedal pulses on the left side.  She  has no ulcers or edema in the foot.   She had an arterial duplex and ABIs performed at [facility] on  05/29/2008.  This showed triphasic waveforms on the right side with a  normal ABI of 1.17.  The left ABI was 0.67 with occlusive disease in the  left distal superficial femoral artery.  Popliteal artery was patent on  the left.   In summary, the patient has claudication symptoms of her left lower  extremity.  I believe the pain in her right knee is most likely  secondary to favoring her left leg.  I do not believe that her  claudication symptoms are severely disabling at this point.  I believe  the best option for now would be risk  factor modification to see if we  can improve her symptoms with noninvasive means.  I prescribed Pletal  for her today 100 mg twice a day.  We also again talked extensively  about smoking cessation.  I also placed her on a walking program of 30  minutes daily.  She states that she is going to try walking her dog 30  minutes a day.   She will follow up with me in 6 months' time for repeat ABIs.  If she  continues to deteriorate or her symptoms become more disabling over time  we could consider percutaneous or other revascularization options.   [name] Fields, MD  Electronically Signed   CEF/MEDQ  D:  06/27/2008  T:  06/28/2008  Job:  [id number]   cc:   Maurice March, M.D.

## 2010-07-08 NOTE — H&P (Signed)
NAME:  Branch, April           ACCOUNT NO.:  [id number]   MEDICAL RECORD NO.:  [id number]          PATIENT TYPE:  INP   LOCATION:  2909                         FACILITY:  [facility]   PHYSICIAN:  Della Goo, M.D. DATE OF BIRTH:  November 21, 1945   DATE OF ADMISSION:  10/22/2007  DATE OF DISCHARGE:                              HISTORY & PHYSICAL   PRIMARY CARE PHYSICIAN:  [facility].   CHIEF COMPLAINT:  Chest pain.   HISTORY OF PRESENT ILLNESS:  This is a 65 year old female with a history  of type 2 diabetes mellitus and hypertension and coronary artery disease  who presents to the emergency department with complaints of having chest  pain off and on for 3 weeks which worsened at 9 p.m. prior to admission.  She describes the discomfort as being a chest tightness and reports that  the discomfort starts in her right ear and travels from her right ear  down her neck into her chest.  She reports when the episode started at 9  p.m. the evening of admission having symptoms of shortness of breath and  clamminess.  She also reports having lightheadedness and weakness as  well.   PAST MEDICAL HISTORY:  1. Coronary artery disease and suffered a myocardial infarction in      1975 which was medically treated.  2. Type 2 diabetes mellitus.  3. Hypertension.  4. Hypothyroidism.  5. Gastroesophageal reflux disease.  6. Anxiety/depression.  7. The patient also has a history of renal cell carcinoma diagnosed      August 31, 2006 which was surgically excised.  8. Also a history of a benign tumor of the lung which was excised.  9. The patient reports also having a history of cervical cancer and is      status post resection.  10.Also had a cholecystectomy.  11.The patient has lumbar degenerative disk disease.  12.Migraine headaches.  13.Right hip bursitis.  14.History of a right lower extremity DVT.   MEDICATIONS:  1. Lantus insulin 50 units subcu at bedtime.  2. Prevacid 30 mg one p.o.  daily.  3. Detrol 4 mg one p.o. daily.  4. Lorazepam 1 mg one p.o. t.i.d.  5. Amitriptyline 100 mg one p.o. at bedtime.  6. Lipitor 10 mg one p.o. at bedtime.  7. Vitamin D 125 mg one p.o. q week.  8. Synthroid 100 mg one p.o. daily.  9. Diclofenac sodium 75 mg one p.o. b.i.d.  10.Over-the-counter iron one p.o. daily.  11.Flexeril 5 mg one p.o. b.i.d.  12.Niacin 500 mg one p.o. daily.  13.NovoLog insulin sliding scale coverage 4 to 10 units 30 minutes      before or after meals for elevated blood sugars.  14.Hydrocodone 7.5/500 one p.o. q.6 h p.r.n. pain.   ALLERGIES:  NO KNOWN DRUG ALLERGIES.  However, the patient reports  having DOXYCYCLINE and suffering infectious colitis in the past.   SOCIAL HISTORY:  The patient lives at home with her son.  She has a  smoking history one pack per day since age 47 and she is a nondrinker.   FAMILY HISTORY:  Positive family history of cardiovascular  disease,  rheumatoid arthritis, emphysema, thyroid disease, and cancer.Marland Kitchen   PAST SURGICAL HISTORY:  1. The patient has had a total abdominal hysterectomy/bilateral      salpingo-oophorectomy.  2. Bilateral tubal ligation.  3. Surgery for plantar fasciitis, left foot.  4. A left knee surgery x2.  5. Bilateral shoulder repairs.  6. The patient also had a history of an anterior and posterior repair      of a symptomatic cystocele and rectocele.   REVIEW OF SYSTEMS:  The patient denies having any fevers, chills,  nausea, vomiting, diarrhea,  bowel changes.   PHYSICAL EXAMINATION:  GENERAL:  This is an obese 65 year old, older  than stated age appearing, female in no discomfort or acute distress.  VITAL SIGNS:  Currently her vital signs are temperature 97.4, blood  pressure 108/60, heart rate 85, respirations 20, oxygen saturation 95-  98%.  HEENT:  Normocephalic, atraumatic.  Pupils are equally round reactive to  light.  Extraocular movements are intact.  Funduscopic benign.  There is  no  scleral icterus.  Oropharynx is clear.  The patient is edentulous.  She has dentures on the top palate.  No exudates or erythema.  NECK:  Supple.  Full range of motion.  No thyromegaly, adenopathy,  jugular venous distention.  CARDIOVASCULAR:  Regular rate and rhythm.  No murmurs, gallops or rubs.  LUNGS:  Clear to auscultation bilaterally.  ABDOMEN:  Positive bowel sounds.  Soft, nontender, nondistended.  EXTREMITIES:  Without cyanosis, clubbing or edema.  NEUROLOGIC:  Nonfocal.   LABORATORY STUDIES:  White blood cell count 8.7, hemoglobin 13.1,  hematocrit 38.7, platelets 342,000.  Protime 13.1, INR 1, PTT 26.  Cardiac enzymes with a CK total of 55, CK-MB 0.7, troponin 0.01,  magnesium 2.2.  Sodium 135, potassium 4, chloride 104, carbon dioxide  27, BUN 11, creatinine 0.64, and glucose 155.  Chest x-ray findings  reveal cardiomegaly, chronic lung disease but no acute disease findings.  EKG reveals a normal sinus rhythm without acute ST segment changes.   ASSESSMENT:  This is a 65 year old female being admitted with  1. Atypical chest pain.  2. Hypertension.  3. Type 2 diabetes mellitus.  4. Hypothyroidism.  5. Transient hypotension after Nitrol paste therapy.Marland Kitchen   PLAN:  The patient will be admitted to an area for cardiac monitoring.  Cardiac enzymes will be performed.  If her blood pressure tolerates,  nitrates will be restarted.  The patient will also be placed on oxygen  and aspirin therapy and DVT and GI prophylaxis.  Her regular medications  will be continued but hold hold parameters will be written for  medications which may decrease her blood pressure.  The patient will be  placed on sliding scale insulin coverage p.r.n.  The patient has also  been administered IV fluids for a fluid challenge.  Further workup will  ensue pending results of the patient's studies and her clinical course.      Della Goo, M.D.  Electronically Signed     HJ/MEDQ  D:  10/23/2007  T:   10/23/2007  Job:  [id number]

## 2010-07-11 NOTE — Op Note (Signed)
NAME:  April Branch, April Branch NO.:  [id number]   MEDICAL RECORD NO.:  [id number]          PATIENT TYPE:  WOC   LOCATION:  WOC                          FACILITY:  [facility]   PHYSICIAN:  Osborn Coho, M.D.   DATE OF BIRTH:  1945-12-11   DATE OF PROCEDURE:  04/29/2006  DATE OF DISCHARGE:                               OPERATIVE REPORT   PREOPERATIVE DIAGNOSES:  1. Symptomatic cystocele.  2. Rectocele.  3. Mixed urinary incontinence.   POSTOPERATIVE DIAGNOSES:  1. Symptomatic cystocele.  2. Rectocele.  3. Mixed urinary incontinence.   PROCEDURE:  1. Tension-free vaginal tape.  2. Anterior and posterior repair.  3. Biopsy of vaginal apex.  4. Removal of vaginal apex inclusion cyst.  5. Cystoscopy.   ATTENDING:  Osborn Coho, M.D.   ASSISTANT:  Marquis Lunch. Lowell Guitar, P.A.-C   ANESTHESIA:  General.   FLUID:  950 mL.   URINE OUTPUT:  100 mL.   ESTIMATED BLOOD LOSS:  200 mL.   SPECIMENS TO PATHOLOGY:  Biopsy specimen of vaginal apex and contents of  inclusion cyst.   COMPLICATIONS:  None.   PROCEDURE:  The patient was taken to the operating room after the risks,  benefits and alternatives were reviewed with the patient.  The patient  verbalized understanding and consent signed and witnessed.  The patient  was placed under general anesthesia and prepped and draped in the normal  sterile fashion in the dorsal lithotomy position.  A weighted speculum  was placed in the patient's vagina and Deaver retractor placed and  vaginal apex identified.  Dilute Pitressin was administered into the  apex and anterior vaginal wall.  The superficial vaginal mucosa at the  vaginal apex was excised and inclusion cyst noted and excised as well.  There was a small opening which appeared to be into the peritoneum;  however, secondary to previous hysterectomy and scarring at that site,  decision was made to investigate further postoperatively for any bowel  injury.  The peritoneal  opening was approximately 2 mm.  Dilute indigo  carmine with approximately 2 drops of indigo carmine in approximately  150-200 mL of saline was instilled in the rectum and no leak noted into  the vagina.  Rectal exam was also performed and no continuation noted.  Gloves were changed as well as drape over legs.  This area was then  sutured with 2-0 Vicryl via a running interlocking stitch.  Attention  was then turned to the anterior vaginal wall, where additional dilute  Pitressin was administered and vaginal wall mucosa incised and dissected  away from the underlying tissue.  Cystocele was repaired with 2-0 Vicryl  via plication stitches.  The level beneath the mid urethra was injected  with dilute Pitressin as well and this area incised and the underlying  tissue dissected to the level of the lower edge of the symphysis pubis.  Attention was then turned to the posterior vaginal wall, where dilute  Pitressin was administered while gauze was placed anteriorly to help  control any bleeding.  The posterior vaginal wall was incised and the  vaginal mucosa dissected away from the  underlying tissue.  The rectocele  was repaired with 2-0 Vicryl via plication stitches.  The vaginal mucosa  posteriorly was repaired with 2-0 Vicryl via a running interlocking  stitch and 0 Vicryl used at the introitus to reinforce the perineal body  and 3-0 Vicryl via a subcuticular stitch was used to repair the  perineum.  Attention was then turned to the mons pubis, where  approximately 0.5-cm incisions were made 2 fingerbreadths away from the  midline bilaterally at the level of the upper edge of the symphysis  pubis.  The bladder was completely emptied and rigid urethral catheter  guide placed and an 18-French Foley used to deflect the urethra to the  patient's left and transabdominal guide passed through the space of  Retzius out through the vagina on the ipsilateral side; the same was  done on the contralateral  side.  The rigid urethral catheter guide was  then removed and cystoscopy performed and no inadvertent bladder injury  noted and bilateral ureters noted to be efflux normally.  The bladder  was drained once again and the catheter guide used to deflect the  urethra to the patient's left and mesh elevated through the space of  Retzius to the incision at the mons pubis; the same was done on the  contralateral side.  Bladder was drained once again and cystoscopy  performed and no inadvertent bladder injury noted.  The Tresa Endo was placed  between the mesh and the mid urethra and the plastic sheath was removed  bilaterally, leaving an area that was slack beneath the mid urethra and  the mesh.  The anterior vaginal wall was then repaired with 2-0 Vicryl  via a running interlocking stitch and the mons pubis incisions were  repaired with Dermabond.  The vagina was packed with 1-inch packing  soaked with estrogen cream.  Sponge, lap and needle count were correct.  The patient tolerated the procedure well and was returned to recovery  room in good condition.      Osborn Coho, M.D.  Electronically Signed     AR/MEDQ  D:  05/04/2006  T:  05/04/2006  Job:  [id number]

## 2010-07-11 NOTE — H&P (Signed)
NAME:  April Branch, April Branch                      ACCOUNT NO.:  [id number]   MEDICAL RECORD NO.:  [id number]                   PATIENT TYPE:  INP   LOCATION:  1856                                 FACILITY:  [facility]   PHYSICIAN:  Jeoffrey Massed, M.D.             DATE OF BIRTH:  28-Apr-1945   DATE OF ADMISSION:  12/14/2001  DATE OF DISCHARGE:                                HISTORY & PHYSICAL   RESIDENT:  Jeoffrey Massed, M.D.   CHIEF COMPLAINT:  Chest pain.   HISTORY OF PRESENT ILLNESS:  This patient is a 65 year old white female with  the complaint of left shoulder pain, which began at rest while eating, and  spread down her left arm and across her chest, characterized as sharp and  severe, rated a 10/10.  She had associated pressure over her chest,  shortness of breath, palpitations, diaphoresis and nausea.  This lasted for  about one hour, and she finally found her nitroglycerin spray, and the chest  pain did not respond to three sprays over 15 minutes, and she had her  husband take her to the emergency room.  In the emergency room the pain diminished to a 3/10 with a nitroglycerin  drip.  The total time that has elapsed since the onset of the pain and my  present evaluation is about eleven hours.  She reports multiple episodes  over the last three or four months of exertional and occasionally non-  exertional chest pain, for which she has used nitroglycerin spray, with good  results.   PAST MEDICAL HISTORY:  1. Chest pain.  She reports a cardiac evaluation in the past some time     before 2002:  She is having difficulty remembering names and times.  Past     records reflect that she reported light heart attacks in 1999, and     2001, at [facility] and at [facility]     respectively.  She did not remember the names of any heart doctors, but     she insists that she had a dye test done at [facility].  2. Diabetes  mellitus type 2, insulin-requiring:  She reports poor control     lately.  She does report diabetic retinopathy and neuropathy.  3. Hypothyroidism:  Originally hyperthyroidism, and she got radiation and     began supplement about 20 years ago.  4. 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  5. Chronic gastritis.  6. Urge incontinence.  7. Tobacco abuse.  8. Depression.  9. Questionable ischemic colitis by colonoscopy in May 2002:  The EGD was     negative at that time.   PAST SURGICAL HISTORY:  1. Bilateral knee surgeries for bone spurs.  2. Bilateral arm surgeries for bone spurs.  3. 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   MEDICATIONS:  1. Lantus 35 units  subcu q.h.s.  2. Darvocet one tab p.o. q.4-6h. p.r.n.  3. Prevacid 30 mg one tab p.o. q.d.  4. Ativan 1 mg p.o. b.i.d.  5. Ativan 2/500 p.o. q.d.  6. Effexor XR 75 mg p.o. q.d.  7. Synthroid 0.125 mg p.o. q.d.  8. Oxybutynin 5 mg p.o. t.i.d.  9. Aspirin 325 mg p.o. q.d.  10.      Bextra 20 mg p.o. q.d.   ALLERGIES:  CODEINE causes over-sedation in the past.   SOCIAL HISTORY/HABITS:  The patient lives in [place] with her husband of  one month and two weeks.  She is on disability, and she formerly drove a  handicap school bus until about 2000.  She smokes one to two packs per day  for the last 35 years.  She denies any alcohol or drug intake.  She has two  children of her own and four stepchildren.   FAMILY HISTORY:  Mother died at age 51, of lung cancer and a CVA.  No  coronary artery disease.  Father died at age 75 of cancer, the kind is  unknown to the patient.  No coronary artery disease.  The patient has six  younger brothers and sisters, none of whom have coronary artery disease.   REVIEW OF SYSTEMS:  Positive for occasional paroxysmal nocturnal dyspnea,  dyspnea on exertion of 300 feet, positive for gastroesophageal reflux  disease, which is a burning in her throat, and positive for headaches   lately.  Denies fever, melena, dysuria, rash, orthopnea, pedal edema, or  hematochezia.  She also reports recent left knee pain and right hip pain.   PHYSICAL EXAMINATION:  VITAL SIGNS:  Temperature 97.2 degrees, pulse 70,  blood pressure 101/57, respirations 18.  O2 saturation 98% on room air.  GENERAL:  She is in no distress.  She is slightly drowsy but oriented to  person, place, time and situation, and can answer questions when prompted.  (She is status post Ativan treatment in the emergency room.)  HEENT:  Pupils equal, round, reactive to light and accommodation.  EOMI.  No  icterus.  Oropharynx is pink, moist mucosa without lesions.  Upper dentures  in place.  Tympanic membranes within normal limits bilaterally.  NECK:  Supple without lymphadenopathy or thyromegaly.  She has dilated  external neck veins.  LUNGS:  Clear to auscultation bilaterally with normal respirations.  CARDIOVASCULAR:  A regular rhythm and rate with a soft systolic murmur at  the left sternal border.  ABDOMEN:  Soft, nontender, nondistended.  Bowel sounds positive.  No  hepatosplenomegaly.  No masses, no bruits.  EXTREMITIES:  No clubbing, cyanosis, or edema.  No ulcers or calluses, with  2+ pulses in both lower extremities.  RECTAL:  Heme-negative, normal tone.   LABORATORY DATA:  PT 12, INR 0.8, PTT 26.  Hemoglobin 11.2, white blood cell  count 8.1, platelet count 351,000, MCV 73, RDW 18.  CK total 79, CK-MB 0.9,  __________ invalid, troponin I of 0.01.  Sodium on an i-STAT-8 was 136,  potassium 5.5, chloride 109, bicarbonate 19, BUN 16, creatinine 0.4, glucose  182.  Chest x-ray showed chronic obstructive pulmonary disease, borderline  cardiomegaly, with no acute disease.  ELECTROCARDIOGRAM:  Showed normal sinus rhythm with a rate of 70.  No  ischemic changes.  Normal electrocardiogram.  ASSESSMENT/PLAN:  A 65 year old white female with chest pain, worrisome for  unstable angina.   PROBLEM:  1. Chest  pain:  Will rule out a myocardial infarction and continue  nitroglycerin drip.  Will hold off on beta blocker, as her pressure is     borderline low.  Will not anticoagulate with heparin at this time.  Have     looked for old records and find no record of a cardiac evaluation, so     will need to arrange risk stratification for coronary artery disease     either as an inpatient this hospitalization, or as an outpatient.  I am     considering other diagnostic possibilities such as gastroesophageal     reflux disease, musculoskeletal pain, or pulmonary embolus, but these are     less likely.  2. Anemia:  This is mild microcytic anemia.  Heme-negative stool.  Will     likely defer the workup as an outpatient.  Diabetes mellitus type 2:  Will hold Avandia med, in case any dye studies  will be done in the hospital.  Will give Lantus as per her home regimen, and  cover with sliding scale insulin.  1. Chronic problems of hypothyroidism, depression, gastroesophageal reflux     disease and urge incontinence:  Will continue outpatient treatments.                                               Jeoffrey Massed, M.D.    PHM/MEDQ  D:  12/14/2001  T:  12/14/2001  Job:  [id number]

## 2010-07-11 NOTE — Consult Note (Signed)
[place]. [facility]  Patient:    April Branch                      MRN: [id number] Proc. Date: 07/01/00 Adm. Date:  [id number] Attending:  Garnette Scheuermann CC:         Sibyl Parr. Darrick Penna, M.D.   Consultation Report  BRIEF HISTORY:  April Branch is a 65 year old female whom I am asked to see by Dr. Lyndee Leo. April Branch and [name], M.D., for abdominal pain, guaiac positive diarrhea and weight loss.  She was admitted to the hospital on Jun 30, 2000, complaining of stomach and chest pain with nausea and vomiting.  Prior to the hospitalization the patient reports to me that she was constipated but in the hospital has developed diarrhea in which she saw some bright red blood and which tested positive for heme.  Her epigastric and chest discomfort have resolved and she has ruled out for an MI.  A CT scan of her abdomen which has not yet been reviewed by me is suggestive of colitis.  She has a history 2 years ago of epigastric discomfort and underwent an upper endoscopy that demonstrated minimal antral gastritis, Clo-test at that time was negative. She has never had any colonoscopic screening.  She is over a 60 pack year smoker, and she has a history of vascular disease, in that, she has had two prior small MIs.  She in addition has lost 65 pounds in the past year which she attributes to depression and not eating secondary to the death of her husband.  She denies dysphagia or typical severe heartburn.  Over the last 24 hours her diarrhea has lessened and she has only had two lose stools so far today.  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  In addition she had a blood clot in her right leg and was treated last year with Coumadin which was discontinued and replaced  by aspirin in December.  ALLERGIES:  No true allergies but codeine makes her excessively sleepy.  FAMILY HISTORY:  Negative for ulcers, gallstones, inflammatory bowel disease, or gastrointestinal neoplasia.  SOCIAL HISTORY:  As noted she has over a 60 pack year smoking history.  She does not drink alcohol.  She is widowed but currently has a boyfriend.  CURRENT MEDICATIONS:  1. Nicotine patches  2. Synthroid 0.125 mg q.d.  3. Altace 2.5 mg q.d.  4. Premarin 0.625 mg q.d.  5. Protonix 40 mg q.d.  6. Nitroglycerin p.r.n.  7. Ditropan 5 mg t.i.d.  8. Lipitor 10 mg q.d.  9. amitriptyline for neuropathy 10. Aspirin, discontinued in hospital.  REVIEW OF SYSTEMS:  General:  65 pound weight loss, no night sweats. Endocrine:  Positive for hypothyroidism and diabetes type 2.  Skin:  No rash or pruritus.  Eyes:  No icterus or change in vision.  ENT:  No aphthous ulcers or chronic sore throat.  Respiratory:  No shortness of breath, cough or wheezing.  Cardiac:  As above.  GI:  As above.  GU:  No dysuria or hematuria. Remainder of review of systems is negative with the exception of peripheral neuropathy.  PHYSICAL EXAMINATION:  GENERAL:  She is an overweight adult female in no acute distress.  VITAL SIGNS:  Afebrile with a blood pressure 120/80, and  a regular pulse of 85.  SKIN:  Normal.  HEENT:  Eyes:  Anicteric.  Oropharynx is unremarkable.  NECK:  Supple without thyromegaly.  There is no cervical or inguinal adenopathy.  CHEST:  Sounds clear.  HEART:  Sounds, regular rate and rhythm without murmur, rub or gallops.  ABDOMEN:  Obese and soft with diffuse lower abdominal tenderness, possibly more on the left side.  There is no rebound or hernia or mass.  Bowel sounds are normal.  RECTAL:  Not performed.  EXTREMITIES:  Without clubbing, cyanosis, or edema or rash.  IMPRESSION:  A 65 year old female with acute chest and abdominal pain mostly in the epigastrium which seems  to have resolved, but ______ by lower abdominal discomfort and diarrhea.  She has seen blood in the stool which is guaiac positive.  While this could all be self-limited infectious colitis, I believe with her CT findings, her weight loss, her age, and her history of cigarette abuse it would be a good idea to rule out inflammatory bowel disease or ischemia before she is discharged.  PLAN:  Both upper and lower endoscopy are discussed with the patient and reviewed in terms of technique, preparation of risk of complications including bleeding and perforation.  She agrees to proceed and it will be scheduled in the morning.  Phospho-soda prep and clear liquid diet will be administered this afternoon and evening.  Please see the orders.  Further recommendations will follow at the conclusion of her endoscopic studies. DD:  07/01/00 TD:  07/02/00 Job: [id number]

## 2010-07-11 NOTE — Op Note (Signed)
[place]. [facility]  Patient:    April Branch                      MRN: [id number] Proc. Date: 06/11/99 Adm. Date:  [id number] Attending:  Milly Jakob                           Operative Report  PREOPERATIVE DIAGNOSIS:  Left knee pain, suspected patellofemoral.  POSTOPERATIVE DIAGNOSIS:  Left knee pain, suspected patellofemoral.  PRINCIPAL PROCEDURES: 1. Chondroplasty, patellofemoral joint. 2. Lateral retinacular release.  SURGEON:  Harvie Junior, M.D.  ASSISTANT:  Kerby Less, P.A.  ANESTHESIA:  General.  BRIEF HISTORY:  The patient is a 65 year old female with a long history of left  knee pain.  She had unfortunately had a right lower extremity clot, so she was eld off from surgery for five months while she was being treated with anticoagulation therapy.  She was taken off of anticoagulation five days ago.  She is brought to the operating room for evaluation of the knee under anesthesia because of the failure of conservative care.  DESCRIPTION OF PROCEDURE:  The patient was brought to the operating room and, after adequate anesthesia was attained ______ the patient was placed supine on the operating table and the left leg was then prepped and draped in the usual sterile fashion.  ______ examination of the knee revealed that there was no significant  damage on the medial side.  The medial meniscus was probed and felt to be stable. The end of the bone was evaluated and no significant divots were identified. The lateral side with likewise normal meniscus and no significant articular defects. There was some mild tissue pinching of the posterolateral side.  This was debrided with the suction shaver.  Attention was turned back to the patellofemoral joint  which, on initial inspection of the knee, it was noted that there was some significant chondral fraying in the central portion of the patella.  At this point, it was  evaluated with a probe and then this was debrided with a suction shaver.  Attention was then turned to the lateral retinaculum, which was noted to be tight. A needle was placed for localization at the superior aspect of the patella and  lateral retinacular release was undertaken with the Bovie.  Excellent release of the tissue was obtained.  The tracking of the patella was then noted to be more  midline, with elevation of pressure off of this area.  At this point, the knee as copiously irrigated with normal saline irrigation and suctioned dry and ______  Steri-Strips and a sterile compressive dressing was applied.  The patient was taken to the recovery room, where she was noted to be in satisfactory condition. Estimated blood loss for the procedure was none. DD:  06/11/99 TD:  06/11/99 Job: [id number]

## 2010-07-11 NOTE — H&P (Signed)
NAME:  April Branch, PE           ACCOUNT NO.:  [id number]   MEDICAL RECORD NO.:  [id number]          PATIENT TYPE:  AMB   LOCATION:  SDC                           FACILITY:  [facility]   PHYSICIAN:  Maurice March, M.D.DATE OF BIRTH:  09-11-1945   DATE OF ADMISSION:  04/29/2006  DATE OF DISCHARGE:                              HISTORY & PHYSICAL   HISTORY OF PRESENT ILLNESS:  April Branch is a 65 year old divorced  white female, para 2, 0-1-2, who is status post total abdominal  hysterectomy with bilateral salpingo-oophorectomy, who presents for an  anterior-posterior repair and placement of tension-free vaginal tape  because of a symptomatic cystocele, rectocele, and mixed urinary  incontinence.  For years, patient reports leaking of urine along with  urinary urgency.  In the past several months, however, she has  complained of feeling as though, everything is going to come out  whenever she urinates.  In the past, patient has used Detrol LA but  states that she had no relief of her symptoms with this medication.  Additionally, the patient notes that she will also leak urine whenever  she coughs or sneezes.  She denies any dysuria, hematuria, flank pain,  fever, or changes in her bowel movements.  A pelvic ultrasound in  January, 2008 was within normal limits, revealing that patient has a  surgically absent uterus, cervix, and ovaries.  Cystometrics performed  in January, 2008 confirmed mixed urinary incontinence.  Patient was then  given options for managing her symptoms, to include observation, anti-  cholinergics, and surgery.  After careful consideration, the patient has  decided to proceed with surgery as a means of managing her symptoms.   PAST MEDICAL HISTORY:  1. OB history:  Gravida 3, para 2, 0-1-2 (patient had vaginal      deliveries).  2. GYN history:  Menarche 65 years old.  Patient has had a      hysterectomy.  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  Patient  also has a history of VAIN II.  Her last Pap smear was in December,      2007, which was ASCUS.  Her last mammogram was in 2007, and it was      within normal limits.  3. Medical history:  Lumbar degenerative disk disease, fracture of her      little toe on seven occasions, hypothyroidism, type 2 diabetes      mellitus, migraine headaches, right hip bursitis, gastroesophageal      reflux disease, and status post right leg DVT.  4. Surgical history:  In 1979, appendectomy; in 1979, cholecystectomy;      in 1989, total abdominal hysterectomy with bilateral salpingo-      oophorectomy.  Additionally, patient has had a bilateral tubal      ligation and multiple orthopedic surgeries (left foot bone spur,      bilateral shoulder repair, and left knee surgery x2) for a torn      cartilage.   FAMILY HISTORY:  Rheumatoid arthritis, cardiovascular disease,  emphysema, thyroid disease, cancer, and irritable bowel syndrome.   SOCIAL HISTORY:  Patient is divorced, disabled,  and lives alone.   HABITS:  She smokes one pack of cigarettes per day.  Does not use  alcohol.   CURRENT MEDICATIONS:  1. Hydrocodone 7.5 four times daily.  2. Multivitamins 1 tablet daily.  3. Detrol LA 4 mg 1 tablet daily.  4. Prevacid 30 mg 1 tablet daily.  5. Amitriptyline 100 mg 2 tablets at bedtime.  6. Lorazepam 1 mg 3 times a day.  7. Synthroid 0.112 mg daily.  8. [facility] aspirin 81 mg daily.  9. Cyclobenzaprine 5 mg 3 times a day.  10.Niacin 500 mg in the morning and 500 mg in the evening.  11.Lantus 50 units at bedtime.  12.Apidra 4-10 units as needed for high glucose levels (sliding scale      insulin).   ALLERGIES:  Patient has no known drug allergies.   REVIEW OF SYSTEMS:  Patient does wear glasses.  She has upper jaw  dentures.  Denies any chest pain, shortness of breath, headache, visual  changes, nausea, vomiting, diarrhea, and except as mentioned in the  history of present illness, the patient's  review of systems is negative.   PHYSICAL EXAMINATION:  VITAL SIGNS:  Blood pressure 100/60.  Weight is  171.  Height is 5 feet, 2 inches tall.  GENERAL:  Neck is supple.  There is no thyromegaly or cervical  adenopathy.  HEART:  Regular rate and rhythm.  LUNGS:  Clear.  BACK:  No CVA tenderness.  ABDOMEN:  No tenderness, masses, or organomegaly.  EXTREMITIES:  No clubbing, cyanosis or edema.  PELVIC:  EG/BUS is within normal limits.  Vagina, first degree  cystocele.  Uterus and cervix are surgically absent.  Adnexa without  tenderness or masses.  Rectovaginal:  Patient has a rectocele, but there  are no palpable masses in the rectum.   IMPRESSION:  1. Symptomatic cystocele.  2. Rectocele.  3. Mixed urinary incontinence.  4. Vaginal intraepithelial neoplasia (VAIN II).   DISPOSITION:  A discussion was held with the patient regarding the  indications for her procedures along with their risks, which include but  are not limited to, reactions to anesthesia, damage to adjacent organs,  infection, excessive bleeding, worsening of her incontinence symptoms,  and erosion of tension-free vaginal tape.  The patient verbalized  understanding of these risks and has consented to proceed with an  anterior/posterior colporrhaphy with placement of tension-free vaginal  tape and cystoscopy at [facility] [place] on April 29, 2006 at  10:00 a.m.      April Branch.    ______________________________  Maurice March, M.D.    EJP/MEDQ  D:  04/26/2006  T:  04/26/2006  Job:  [id number]

## 2010-07-11 NOTE — Assessment & Plan Note (Signed)
[facility]                            CARDIOLOGY OFFICE NOTE   NAME:April Branch                    MRN:          [id number]  DATE:06/17/2006                            DOB:          [birth year]    April Branch is seen today as a new patient.  Currently, I know her from  previous care of her husband.  The patient is referred by Dr. Audria Nine  and Dr. Su Hilt for pounding of the heart, chest pain, and shortness of  breath.  In talking to the patient, she has had some increasing dyspnea  over the last month or 2.  There has been no previous history of  documented COPD, but she is a long-time smoker.  The patient has not had  a fever or cough.  There has been no pleuritic component and no history  of DVT.  In regards to her chest pain, it is fairly nonspecific.  It is  not exertional.  It is in the center of her chest.  It can be sharp.  Again, she has had this for about the same period of time.  She does not  have increasing shortness of breath with recumbency and there has been  no increasing lower extremity edema.  The patient has seen Dr. Meade Maw in the past.  She had a normal cath in, I believe, 2005.  She  did not want to follow up with [facility], and since we had helped  take care of her husband, she was referred here.   The patient's other cardiac risk factors include hypertension.   The patient was recently hospitalized from March 6 to March 9.  She had  cystocele and rectocele surgery.  Unfortunately, on surveillance CT  scanning, apparently, she has a suspicious mass in the left lower pole  of the kidney.  She is to see Dr. Brunilda Payor for probable resection of part of  her kidney for a high likelihood of renal cell carcinoma.  I believe the  malignancy was documented by PET scanning.   She was discharged from the hospital on Avelox, Flagyl, Colace, and  Vicodin.   The patient's review of systems currently is remarkably for  less urinary  incontinence.  She has not noted any significant hematuria.  Her chest  pain syndrome and shortness of breath have been somewhat constant over  the last month or 2.   She continues to smoke half a pack a day for 40 years.   She is currently disabled.  She has significant depression.  She is  widowed for the last 8 years.  She has adult children and is fairly  inactive.  She has had previous hysterectomy, leg and shoulder surgery  for bone spurs, and gallbladder surgery.   FAMILY HISTORY:  Remarkable for no premature coronary artery disease and  otherwise unremarkable.  She only knows that her mother died at age 58  of some sort of cancer, and the father also died of some sort of cancer  at age 63.   CURRENT MEDICATIONS:  1. Multivitamins.  2. Hydrocodone.  3. Detrol LA  4 mg.  4. Prevacid 30 mg a day.  5. Amitriptyline 1 g 2 tablets a day.  6. Lorazepam 1 mg t.i.d.  7. Synthroid 112 mcg a day.  8. An aspirin a day.  9. Cyclobenzaprine 5 mg t.i.d.  10.Niacin 500 a day.  11.Lantus as directed.  12.Ephedra 10 units sliding scale.   EXAM:  The blood pressure is 140/70, pulse 70 and regular.  HEENT:  Normal.  Carotids normal without bruit.  LUNGS:  Clear.  There is an S1, S2 with normal heart sounds.  ABDOMEN:  Benign.  LOWER EXTREMITIES:  Intact pulses.  No edema.  NEURO:  Nonfocal.  SKIN:  Warm and dry.   ELECTROCARDIOGRAM:  Shows normal sinus rhythm with no acute changes.   IMPRESSION:  I am not sure what to make of the patient's symptoms.  She  does not appear to be high risk for cardiac event.  She had a normal  cath in 2005.   The patient is pre-op for renal cell surgery.   I will try to get the rest of her records from [facility].  I think she could  be cleared for surgery at this time.  She is trying to cut back on her  cigarettes.  She understands that this is contributing to her dyspnea.  Even stopping 4 weeks before any surgery would be helpful.   I  do not think that she needs a stress test at this time.  Her pain is  somewhat atypical and she had a normal cath in 2005.   She will continue her aspirin and Niaspan in terms of stroke prophylaxis  and risk factor modification.   Regarding her dyspnea, she should probably have followup PFTs pre and  post bronchodilator.  I will leave this up to her primary care M.D.  There is no evidence of cardiomegaly on exam or decreased left  ventricular function without prior MI on her EKG.   I will see the patient back in about a month to see how she is doing,  but if she needed to have her kidney surgery now, she could.     April Pick. Eden Emms, MD, [facility]  Electronically Signed    PCN/MedQ  DD: 06/17/2006  DT: 06/17/2006  Job #: [id number]

## 2010-07-11 NOTE — Discharge Summary (Signed)
NAME:  April Branch, April Branch           ACCOUNT NO.:  [id number]   MEDICAL RECORD NO.:  [id number]          PATIENT TYPE:  INP   LOCATION:  9311                          FACILITY:  [facility]   PHYSICIAN:  Osborn Coho, M.D.   DATE OF BIRTH:  11-08-1945   DATE OF ADMISSION:  04/29/2006  DATE OF DISCHARGE:  05/02/2006                               DISCHARGE SUMMARY   DISCHARGE DIAGNOSES:  1. Symptomatic cystocele.  2. Rectocele.  3. Mixed urinary incontinence.  4. Left renal lesion.   OPERATION:  On the date of admission the patient underwent an anterior  posterior repair with biopsy of her vaginal apex, the removal of a  vaginal apex inclusion cyst, placement of tension-free vaginal tape and  cystoscopy, tolerating all procedures well.   PROCEDURE:  On April 29, 2006, the patient underwent a CT scan of her  pelvis with contrast which revealed no evidence of bowel perforation.   HISTORY OF PRESENT ILLNESS:  April Branch is a 65 year old divorced  white female, para 2012, who is status post total abdominal hysterectomy  with bilateral salpingo-oophorectomy presenting for an anterior  posterior repair and placement of tension-free vaginal tape because of a  symptomatic cystocele, rectocele and mixed urinary incontinence  symptoms.  Please see the patient's dictated history and physical  examination for details.   PREOPERATIVE PHYSICAL EXAM:  VITAL SIGNS:  Blood pressure 100/60, weight  is 171, height is 5 feet 2 inches tall.  GENERAL:  Within normal limits.  PELVIC:  EGBUS is within normal limits.  VAGINA:  First-degree cystocele.  Uterus and cervix are surgically  absent.  Adnexa without tenderness or masses.  Rectovaginal revealed a  rectocele, but there are no palpable masses in the rectum.   HOSPITAL COURSE:  On the date of admission the patient underwent  aforementioned procedures tolerating them all well.  Postoperatively the  patient achieved a temperature of 102 degrees  Fahrenheit orally, which  caused concern to be turned to a possibility of bowel perforation at the  time of the patient's vaginal cuff biopsy.  The patient then underwent a  CT scan with contrast of the pelvis, however, it did not reveal any  evidence of bowel perforation.  Additionally, the patient was seen by  General Surgery as a consult and it was felt that the patient's  persistent fever was due to atelectasis and she was, therefore,  prescribed vigorous pulmonary toilet.  By postop day number 2 the  patient had resumed bowel and bladder function.  However, her  temperature remained elevated until postop day number 3, at which time  she had been afebrile for 24 hours and, therefore, deemed ready for  discharge home.  Of note, on patient's CT scan was a lesion on the  patient's left kidney for which she would receive follow-up as an  outpatient.   DISCHARGE LABS:  White blood count 11.3, hemoglobin 8.9 (preop  hemoglobin 11.5), hematocrit 27.5, platelets 332; basic metabolic panel  sodium 135, potassium 3.9, chloride 105, CO2 26, glucose 197, BUN 5,  creatinine 0.63.   DISCHARGE MEDICATIONS:  1. Vicodin 1-2 every 4 hours  as needed for pain.  2. Ibuprofen 600 mg with food every 6 hours for 3 days, then as needed      for pain.  3. Colace 100 mg twice daily until bowel movements are regular.  4. Avelox 400 mg daily for 5 days.  5. Flagyl 500 mg twice daily for 5 days.  6. The patient was also referred to her home medication reconciliation      form.   FOLLOW UP:  The patient is scheduled for 6 weeks postoperative visit  with Dr. Su Hilt on June 08, 2006 a 2:30 p.m.  She is also to be given,  via [facility], an appointment to follow up with a  urologist for her renal lesion.   DISCHARGE INSTRUCTIONS:  1. The patient was given a copy of [facility]      postoperative instruction sheet.  2. She was further advised to avoid driving for 1 week, lifting for  4      weeks, intercourse for 6 weeks, that she may walk up steps, that      she may shower and bathe.  3. Diet should be a diabetic diet.   FINAL PATHOLOGY:  Vaginal biopsy:  Benign squamous mucosa with chronic  inflammation.      April Branch.      Osborn Coho, M.D.  Electronically Signed    EJP/MEDQ  D:  05/24/2006  T:  05/24/2006  Job:  [id number]

## 2010-07-11 NOTE — Discharge Summary (Signed)
NAME:  April Branch, April Branch                      ACCOUNT NO.:  [id number]   MEDICAL RECORD NO.:  [id number]                   PATIENT TYPE:  INP   LOCATION:  2012                                 FACILITY:  [facility]   PHYSICIAN:  Billey Gosling, M.D.                 DATE OF BIRTH:  1945/06/13   DATE OF ADMISSION:  12/14/2001  DATE OF DISCHARGE:  12/14/2001                                 DISCHARGE SUMMARY   DISCHARGE DIAGNOSES:  1. Chest pain, ruled out for myocardial infarction.  2. Possible costochondritis.  3. Diabetes mellitus type 2, insulin requiring.  4. Hypothyroidism.  5. Chronic gastritis.  6. Colitis.  7. Osteoarthritis.  8. Spastic bladder.  9. Tobacco abuse.  10.      Depression.   PROCEDURES:  None.   CONSULTATIONS:  None.   DISCHARGE MEDICATIONS:  1. Protonix 40 mg q.d.  2. Lantus 35 units SQ q.h.s.  3. Ativan 1 mg b.i.d. p.r.n.  4. Effexor XR 75 mg q.d.  5. Synthroid 0.125 mg q.d.  6. Ditropan 5 mg t.i.d.  7. Aspirin 325 mg q.d.  8. Avandamet 2/500 mg q.d.  9. Ibuprofen 800 mg t.i.d. x7 days.   DISPOSITION AND FOLLOWUP:  The patient is to follow up with Tomi Bamberger,  N.P. at [facility] on December 20, 2001 which is already  scheduled.  Of note, the patient did receive the flu vaccine during  hospitalization and said that she received the Pneumovax about four years  ago, so that was not given.  Labs pending include a hemoglobin A1c and a  third set of cardiac enzymes, although the first two have been completely  normal.   BRIEF HISTORY AND PHYSICAL:  This is a 65 year old white female with  complaint of chest pain radiating to the left shoulder at rest which started  while she was eating on day of discharge.  It spread down her left arm and  she characterized it as a sharp 10/10 pain.  She was short of breath,  diaphoretic, nauseous, and complained of palpitations associated with the  pain.  She took three sprays of nitroglycerin and the  pain did not diminish  at all, so she had her husband bring her to the [facility] Emergency  Department.  The patient also reports that the last 3-4 months she had  several episodes per week of this chest pain requiring nitroglycerin spray.    HOSPITAL COURSE:  1. CHEST PAIN:  The patient was admitted and started on a nitroglycerin drip     while in the emergency department.  Cardiac enzymes were obtained and the     first set showed a CK of 79, CK-MB of 0.9, and a troponin-I of 0.1.  The     second set showed a CK of 46, CK-MB of 0.8, and troponin-I of 0.02.  The     patient had a normal EKG  as well and she was therefore ruled out for     acute MI.  The patient may be a candidate for further workup as an     outpatient, such as stress testing and risk stratification.  The     patient's chest pain did continue even on the nitroglycerin drip.  The     nitroglycerin drip was discontinued and the patient was given a GI     cocktail x1.  After the GI cocktail, the patient reported her pain did     improve, and then prior to discharge on exam the patient was exquisitely     tender over the costochondral joints, so this pain could possibly be     secondary to costochondritis or a GI etiology.  She will be discharged on     Protonix for her chronic gastritis and ibuprofen 800 mg t.i.d. x7 days.     The patient was stable and discharged to home.   1. DIABETES MELLITUS TYPE 2:  The patient's Avandamet was held on admission     secondary to just in case any dye studies needed to be done.  The patient     will be restarted on the Avandamet upon discharge and continue her Lantus     as taken at home.  Her blood sugars remained stable throughout     hospitalization.   1. MILD ANEMIA:  Hemoglobin and hematocrit upon admission were 11.2 and MCV     was 73 with an RDW of 18.  This was not worked up while in the hospital     and the patient may need to have further workup done as an outpatient.      Her guaiac of her stools were negative.   1. HYPOTHYROIDISM:  The patient remained stable throughout hospitalization     and she is continued on her Synthroid at the current dose.   1. DEPRESSION:  Stable on current dose of Effexor XR and discharged home on     that dose.   1. SPASTIC BLADDER:  Stable throughout hospitalization and discharged on her     home medicine.   ADDITIONAL LABORATORY DATA:  A BMP obtained was within normal limits.  White  blood cell count of 8.1, platelets 351.  PT 12, INR 0.8, PTT 26.  Blood  sugar 182.  Hemoglobin A1c is still pending.                                               Billey Gosling, M.D.    AS/MEDQ  D:  12/14/2001  T:  12/15/2001  Job:  [id number]   cc:   Tomi Bamberger, N.P.  [facility]  [place], [place]

## 2010-07-11 NOTE — Op Note (Signed)
NAME:  April Branch, April Branch                      ACCOUNT NO.:  [id number]   MEDICAL RECORD NO.:  [id number]                   PATIENT TYPE:  AMB   LOCATION:  DSC                                  FACILITY:  [facility]   PHYSICIAN:  Harvie Junior, M.D.                DATE OF BIRTH:  1945-10-16   DATE OF PROCEDURE:  10/04/2002  DATE OF DISCHARGE:                                 OPERATIVE REPORT   PREOPERATIVE DIAGNOSIS:  Plantar fascial pain, left foot.   POSTOPERATIVE DIAGNOSIS:  Plantar fascial pain, left foot.   OPERATION PERFORMED:  Endoscopic plantar fascial release, left foot with  extension of the medial portal and open plantar fascial release.   SURGEON:  Harvie Junior, M.D.   ASSISTANT:  Marshia Ly, P.A.   ANESTHESIA:  General.   INDICATIONS FOR PROCEDURE:  The patient is a 65 year old female with a long  history of having significant heel pain.  She ultimately had been treated  with multiple cortisone injections over time, anti-inflammatory medication,  activity modifications, physical therapy.  None of this worked and because  of continued complaints of pain, she was ultimately taken to the operating  room for plantar fascial release.   DESCRIPTION OF PROCEDURE:  The patient was taken to the operating room and  after adequate anesthesia was obtained with general anesthetic, the patient  was placed supine on the operating table. The left foot was prepped and  draped in the usual sterile fashion.  Following this, an incision was made 2  cm proximal to the skin on the heel.  This was measured on x-ray.  The area  was checked and the leg was exsanguinated and a blood pressure tourniquet  was inflated to 250 mmHg after routine prepping and draping.  Following  this, a small incision was made over the medial side.  A feeler was put  through the fascia was identified on the top and plantar surface right  adjacent to the bone.  Once this was established, the endoscope was  placed  through the heel and the plantar fascia was clearly identified.  With a  windlass maneuver you could see the plantar fascia moving.  At this point a  curved blade was used and there unfortunately was some calcification within  the plantar fascia. This could be seen on our x-ray preoperatively.  There  were multiple calcifications in the heel.  It was unclear exactly the  location of all these calcifications; however, it became clear that there  was some calcification within the plantar fascia origin.  At this point the  medial incision was extended slightly and the retractor was used to hold the  abductor out of place and the plantar fascia was identified and was then  divided with a curved Metzenbaum scissor.  Once this was done, the feeler  could be put back in.  The bone could be felt and there were no  tight bands  between the bone and the fat below and at that point, the wound was  copiously irrigated and suctioned dry.  The wounds were closed with 4-0  nylon interrupted sutures.  Sterile compressive dressing was applied.  The  patient was taken to the recovery room where she was noted to be in  satisfactory condition.  The estimated blood loss was none.                                               Harvie Junior, M.D.   [name]  D:  10/04/2002  T:  10/04/2002  Job:  [id number]

## 2010-07-11 NOTE — Cardiovascular Report (Signed)
NAME:  April Branch, April Branch NO.:  [id number]   MEDICAL RECORD NO.:  [id number]                   PATIENT TYPE:   LOCATION:                                       FACILITY:   PHYSICIAN:  Meade Maw, M.D.                 DATE OF BIRTH:  02-26-1945   DATE OF PROCEDURE:  DATE OF DISCHARGE:                              CARDIAC CATHETERIZATION   REFERRING PHYSICIAN:  [facility]   INDICATIONS FOR PROCEDURE:  Ongoing persistent chest pain becoming more  progressive in severity.  There is a small region of fixed defect in the  anterior apex on Cardiolite which was primarily consistent with breast  attenuation and the ongoing pain.  Risks, benefits, and options were  discussed with the patient and she wished to proceed with left heart  catheterization.   DESCRIPTION OF PROCEDURE:  After obtaining written informed consent, the  patient was brought to the cardiac catheterization laboratory in the  postabsorptive state. Preoperative sedation was achieved using IV Versed.  The right groin was prepped and draped in the usual sterile fashion. Local  anesthesia was achieved using 1% Xylocaine.  A 6 French hemostasis sheath  was placed into the right femoral artery using the modified Seldinger  technique. Selective coronary angiography was performed using JL4, JR4  Judkins catheter. Multiple views were obtained. All catheter exchange were  made over a guide wire. The patient received Versed 3 mg IV, fentanyl 50 mcg  throughout the procedure.  She continued to remain anxious, had complaints  of significant pain at the groin site as well as chest pressure during the  procedure.  There was no identifiable lesions in the coronary.  She was  given sublingual nitroglycerin without relief.  By the end of the procedure,  the patient's chest pain had started to resolve.  She was transferred to the  holding area where she will receive Mylanta p.o.   FINDINGS:  Aortic pressure is 112/62, LV pressure is 141/6, the EDP is 11.  Single plan ventriculogram revealed normal wall motion with an ejection  fraction of 65%.  There was post PVC mitral regurgitation noted following.   CORONARY ANGIOGRAPHY:  Left main coronary artery:  The left main coronary artery bifurcates into  the left anterior descending and circumflex vessel.  There is no disease in  the left main coronary artery.   Left anterior descending:  The left anterior descending gives rise to a  small to moderate diagonal #1, small diagonal #2 and goes on to end as an  apical recurrent branch. There is no disease in the left anterior descending  or its branches.   Circumflex vessel:  The circumflex vessel is a large vessel dominant for the  posterior circulation, gives rise to a very large OM-1.  Following the OM-1,  there is a 30% proximal lesion, a small OM-2, a large OM-3 and a large  posterolateral branch.   Right coronary artery:  The right coronary artery is a small nondominant  artery, gives rise to two small RV marginals, and a PDA branch. There is no  disease in the right coronary artery.   FINAL IMPRESSION:  1. A 30% proximal lesion in the circumflex.  2. Otherwise normal coronaries.  3.     Normal ventriculogram with an ejection fraction of 65%.  4. Other etiologies for her chest pain should be considered.   PLAN:  Prevacid will be discontinued and she will be started on Protonix 20  mg daily.                                                  Meade Maw, M.D.    HP/MEDQ  D:  01/05/2002  T:  01/05/2002  Job:  [id number]

## 2010-07-11 NOTE — Discharge Summary (Signed)
April Branch. [facility]  Patient:    April Branch                      MRN: [id number] Adm. Date:  [id number] Disc. Date: [id number] Attending:  Garnette Branch Dictator:   April Branch, M.D. CC:         April Branch, [facility]  April Branch, [facility]   Discharge Summary  DISCHARGE DIAGNOSES:  1. Colitis, questioned secondary to ischemia.  2. Anemia secondary to #1.  3. Coronary artery disease.  4. Hypolipidemia.  5. Hypothyroid.  6. Type 2 diabetes.  7. Diabetic neuropathy.  8. Chronic gastritis.  9. Depression. 10. Osteoarthritis. 11. Spastic bladder.  DISCHARGE MEDICATIONS:  1. Lipitor 10 mg p.o. q.d.  2. Amitriptyline 100 mg p.o. q.h.s.  3. Altace 2.5 mg p.o. q.d.  4. Lorazepam 0.5 mg p.o. b.i.d. p.r.n.  5. Synthroid 0.125 mg p.o. q.d.  6. Premarin 0.625 mg p.o. q.d.  7. Ditropan 5 mg p.o. t.i.d.  8. Protonix 40 mg p.o. q.d.  9. Avandia 8 mg p.o. q.a.m. 10. Hold aspirin until patients GI evaluation is completed and hemoglobin and     hematocrit are stable.  HISTORY OF PRESENT ILLNESS:  This is a pleasant, 65 year old, white female who came in with onset of stomach and chest pain with the stomach pain preceding the chest pain.  She characterized chest pain as 10/10.  There is no radiation.  Chest pain lasted over an hour.  She had positive shortness of breath, positive diaphoresis.  She took three nitroglycerin which helped to relieve chest pain and 911 was called.  PAST MEDICAL HISTORY: 1. History of two "light heart attacks".  The first was in 07/12/1997, and evaluated    at [facility].  The second was December 2001, at [facility]. 2. 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 3. Hypothyroid. 4. Type 2 diabetes. 5. Diabetic neuropathy. 6. Chronic gastritis. 7. Depression. 8. Osteoarthritis. 9. Spastic bladder.  MEDICATIONS:  1. Lipitor 10  mg p.o. q.d.  2. Amitriptyline 100 mg p.o. q.h.s.  3. Altace 2.5 mg p.o. q.d.  4. Lorazepam 0.5 mg p.o. b.i.d. p.r.n.  5. Synthroid 0.125 mg p.o. q.d.  6. Premarin 0.625 mg p.o. q.d.  7. Ditropan 5 mg p.o. t.i.d.  8. Protonix 40 mg p.o. q.d.  9. Avandia 8 mg p.o. q.a.m. 10. Hold aspirin until patients GI evaluation is completed and hemoglobin and     hematocrit are stable.  FAMILY HISTORY:  Positive for arthritis and lung cancer.  The patient gives no history of MI, coronary artery disease or diabetes in the family.  SOCIAL HISTORY:  The patient was accompanied by her boyfriend who she is very fond of.  She is greater than a 60-pack-year smoker.  The patient states she has lost 65 pounds in the last year, but attributes most of this to her husband who died in 07/13/98, and the grief she has suffered from that as well as some ongoing depression.  She denies alcohol or drug use.  She has several pets which she enjoys.  She enjoys going deep sea fishing and traveling with her boyfriend, April Branch.  HOSPITAL COURSE:  The patient was admitted for chest pain, rule out MI and abdominal pain.  Abdominal CT was obtained which revealed colitis in the splenic flexure and descending colon with a lesser degree in  the sigmoid colon.  The patient ruled out on cardiac enzymes.  EKG with normal sinus rhythm with no ST elevation and no reverse T waves.  Her BMP was within normal limits.  Her presenting hemoglobin and hematocrit were 14 and 41.5 respectively.  On admission, the patient had a white count of 23 with greater than 20% bands.  Blood cultures were taken as well as stool cultures which remained negative over hospital course.  Her white count dropped considerably and within 24 hours, had dropped to 16.  She remained afebrile over hospital course and antibiotics were not given without an obvious source.  Her CBCs were also followed and the patient had a gradual decline from 14 to the time of  admission which was 10.9, which had remained stable over 24 hours.  GI was consulted regarding her anemia, positive Hemoccult stool and 65 pound weight loss in addition to the severe smoking history, she was at high risk for ischemic colitis as well as cancer.  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  As a result, the patient required a barium enema.  However, because biopsies were taken from the colonoscopy, barium enema would need to be postponed for 10 days. The patient did have a polyp of her sigmoid colon which was removed and sent to pathology.  The patient also had an EGD which was unremarkable.  She did have mild nodularity of her antrum which was biopsied, but otherwise was within normal limits.  The GI physician who was consulted and performed the procedure was Dr. Roosvelt Branch from [facility].  The patients cardiac status remained stable over hospital course.  She had no episodes of chest pain or dyspnea.  Enzymes were negative.  EKGs remained unchanged.  The patient had normal sinus rhythm while be on telemetry over her hospitalization.  At the time of discharge, she was stable with desire to go home.  FOLLOWUP:  The patient will see [facility] for her barium enema on May 24, at 9:30 a.m.  She will be given a prep kit from the hospital today for that.  She was then scheduled to see Dr. Luther Branch on May 24, at 4 p.m. for follow up of the barium enema and further evaluation.  CONDITION ON DISCHARGE:  We suspect the patients colitis is secondary to ischemia, but since her colonoscopy was not complete, barium enema will need to be assessed for further evaluation.  We plan on holding her aspirin until her GI evaluation is complete.  The patient had suffered some blood loss from  her colitis.  She had a trending down  hemoglobin with initial 14 and at time of discharge was 10.9.  Over the last 24 hours, the patient stayed stable regarding her hemoglobin, however, it is important for her to have this checked out as an outpatient on Monday.  We have contacted Dr. Arvid Right office, who is an internist at the [facility] regarding lab appointment set up.  She is scheduled to be seen there at 7 a.m. on Monday, for CBC and further evaluation.  Given the patients cardiac history, I would hesitate to wait until she suffers symptoms and would rather plan to transfuse her should her hemoglobin drop further to a level of 8.  The patient had no symptoms since exam in the emergency department.  She was not placed on heparin  secondary to her heme positive stools in the emergency department.  She ruled out on enzymes, but given her history of probable ischemic bowel, question whether she may need further intervention regarding her heart.  I suspect that she is having these types of ischemic symptoms in her distal vasculature that she is probably suffering the same of her coronaries and may warrant cardiac catheterization.  Will have her cardiologist at [facility] further evaluate this as well as he primary doctor, April Branch. DD:  07/02/00 TD:  07/05/00 Job: [id number]

## 2010-07-24 ENCOUNTER — Ambulatory Visit: Payer: Medicare Other | Admitting: Vascular Surgery

## 2010-07-24 NOTE — H&P (Signed)
NAME:  April Branch, April Branch           ACCOUNT NO.:  [id number]  MEDICAL RECORD NO.:  [id number]           PATIENT TYPE:  E  LOCATION:  [facility]                         FACILITY:  [facility]  PHYSICIAN:  Pearlean Brownie, M.D.DATE OF BIRTH:  [birth year]  DATE OF ADMISSION:  06/05/2010 DATE OF DISCHARGE:                             HISTORY & PHYSICAL   PRIMARY CARE PHYSICIAN:  Dr. Lerry Liner.  CARDIOLOGIST:  She is seen at [facility].  VASCULAR SURGEON:  [name] Fields, MD  CHIEF COMPLAINT:  Lower extremity redness and swelling.  HISTORY OF PRESENT ILLNESS:  Mr. Mendibles is a 65 year old female with past medical history significant for peripheral arterial disease, hyperlipidemia, and recent left femoral to above-knee popliteal bypass who presents today with a 4-day history of redness, pain, and swelling at her surgical site.  The patient had operation on May 28, 2010, and was discharged from hospital on June 01, 2010.  On the day following discharge, she began experiencing pain at surgical site, nausea, vomiting, fevers, and chills.  She presented today at [facility] for followup appointment.  The patient states she did not come in prior to appointment today because she was afraid to be seen in the hospital. She was sent to the emergency department for probable surgical site infection.  She has been unable to tolerate p.o. medications or liquids for the past 2-3 days.  She is given unknown antibiotics when she left the hospital, but has not been able to tolerate them.  She was sent to the ED and the [facility] called for admission. She endorses retrosternal chest pain since Monday that is occasionally accompanied by shortness of breath.  She described "someone is bitting my chest."  This relieved spontaneously without medications.  No current symptoms or pain except at the surgical site.  No palpitations, abdominal pain, headaches,  weakness, paresthesias, or numbness.  PAST MEDICAL HISTORY: 1. Peripheral arterial disease. 2. Diabetes mellitus type 2. 3. Hyperlipidemia. 4. Tobacco abuse. 5. Cholecystectomy for surgical gallbladder disease.  MEDICATIONS: 1. Lantus 60 units subcu daily. 2. NovoLog 3-4 units daily with meals. 3. She is unable to recall the rest of her medications secondary to     pain.  ALLERGIES:  NIACIN and SIMVASTATIN.  FAMILY HISTORY:  Lung cancer in her mother.  SOCIAL HISTORY:  She lives at home with her son who does not help her with her activities of daily living.  She has a 50-pack-year history of smoking.  She stopped smoking when she was admitted to the hospital last week.  Denies any ethanol or illicit drug use.  REVIEW OF SYSTEMS:  A 12-point review of systems is negative except for as per HPI.  PHYSICAL EXAMINATION:  VITAL SIGNS;  Temperature 98.3, pulse 87, respiratory rate 15, blood pressure 133/63, and O2 sats 99% on room air. GENERAL:  She is lying in bed in mild distress due to pain. HEENT:  Head is normocephalic and atraumatic.  Normal conjunctivae. Extraocular movements are intact.  Pupils are equal, reactive, and round.  Mucous membranes are moist.  TMs are clear bilaterally. NECK:  No JVD.  No thyromegaly.  CARDIOVASCULAR:  Regular rate and rhythm.  Murmurs grade 2/6 at the upper sternal borders bilaterally. RESPIRATIONS:  Clear to auscultation bilaterally.  No crackles.  Breath sounds heard throughout. ABDOMEN:  Soft, nondistended, and nontender.  Bowel sounds present x4. EXTREMITIES:  Erythema noted in the left thigh.  Area marked with pen. 6-cm diameter of erythema across left thigh.  1-cm area of fluctuance noted on the inner aspect of her groin.  Surgical site has scabbed border.  No spontaneous bleeding or drainage from sites.  +1 edema of the left leg.  No edema of the right leg.  Pulses are 2+ DP pulses bilaterally with brisk cap refill in toes. NEURO:   Cranial nerves II-XII intact.  No sensorimotor deficits in bilateral upper and lower or lower extremities.  Of note, distal sensation is present in bilateral lower extremities.  LABORATORY STUDIES:  CBC showed WBCs of 10.6, hemoglobin 11.2, hematocrit 33.3, and platelets 547.  BMET showed sodium 147, potassium 3.7, chloride 105, bicarb 27, glucose 144, BUN 7, creatinine 3.77, and calcium 8.4.  Triglycerides 198.  Cardiac panel:  CK-MB of 0.7 and troponin 0.01.  ASSESSMENT/PLAN:  This is a 65 year old female with past medical history significant for peripheral arterial disease, tobacco abuse, and recent popliteal bypass, here with infection in the surgical sites 1. Infection:  White blood cell count 10.6 in the emergency     department.  The patient has persistent fever, nausea, and vomiting     for the past 3 days.  Concern for systemic infection.  Mild     distress due to pain in the emergency department.  Plan to admit     and start on vancomycin and ceftriaxone.  We will send for blood     cultures.  Surgical sites will need to be opened and drained and     there is an area of fluctuance present.  Plan to call [facility] in the     a.m.  She will be stable overnight.  Afebrile here in the emergency     department and [facility] office this a.m.  Morphine for pain.  Although     this is low likelihood for this, she does have lower extremity     edema and we will obtain Dopplers of lower extremities to rule out     DVT.  Again, this is less likely. 2. Nausea and vomiting:  Plan to treat with Phenergan.  N.p.o. for now     as she may need surgical instrumentation in the morning. 3. Hyperlipidemia:  The patient is unable to remember all other     medications she takes.  This is most likely secondary to pain and     distress.  We will have Pharmacy check for med reconciliation. 4. Diabetes mellitus type 2:  She has not taken insulin since the past     3 days and she has not eaten for the past 3 days.   Place on sliding     scale insulin here.  Lantus 6 units at home.  We will follow CBGs     and serum electrolytes as she may require in-house.  We will hold     Lantus for now. 5. Chest pain, atypical:  EKG in the emergency department was     negative.  We will cycle cardiac enzymes. 6. Fluids, electrolytes, nutrition and gastrointestinal:  N.p.o., on     IV fluids at 120 mL/hour. 7. Prophylaxis:  Heparin 5000 units subcu t.i.d. 8. Disposition:  Pending clinical improvement and consult by [facility].     Renold Don, MD   ______________________________ Pearlean Brownie, M.D.    JW/MEDQ  D:  06/06/2010  T:  06/06/2010  Job:  [id number]  Electronically Signed by Renold Don  on 06/17/2010 02:49:34 PM Electronically Signed by Pearlean Brownie M.D. on 07/24/2010 09:36:51 AM

## 2010-07-31 ENCOUNTER — Ambulatory Visit (INDEPENDENT_AMBULATORY_CARE_PROVIDER_SITE_OTHER): Payer: Medicare Other | Admitting: Vascular Surgery

## 2010-07-31 DIAGNOSIS — I70219 Atherosclerosis of native arteries of extremities with intermittent claudication, unspecified extremity: Secondary | ICD-10-CM

## 2010-08-01 NOTE — Assessment & Plan Note (Signed)
OFFICE VISIT  April Branch, April Branch DOB:  02-24-1946                                       07/31/2010 EAVWU#:[id number]  The patient returns for followup today.  She previously underwent a left femoral to above knee popliteal bypass with vein on 05/27/2010.  She was last seen on May 10 and had some drainage from her left groin.  She returns for followup today to make sure that this is continuing to heal.  She denies any claudication symptoms at this point.  She does have still some mild swelling in the left lower extremity but overall is improved. She is no longer requiring narcotic pain medication.  PHYSICAL EXAM:  Blood pressure is 155/80 in the left arm, heart rate is 80 and regular, respirations 18.  Left lower extremity, all of her incisions are completely healed at this point.  She has a 2+ left femoral pulse and a 2+ left dorsalis pedis pulse.  At this point the patient is doing well.  She has no claudication symptoms.  She does still have some claudication symptoms in the right lower extremity.  However, I believe she needs some time to continue to heal her left lower extremity before we address any symptoms on the right side.  In light of this she will follow up with a duplex ultrasound of her bypass graft in July of 2012.  Subsequent to that she will have a repeat duplex and ABIs in October of 2012.  At that time we will consider whether or not an intervention of the right leg would be in her best interest.    [name] Fields, MD Electronically Signed  CEF/MEDQ  D:  08/01/2010  T:  08/01/2010  Job:  [id number]  cc:   Maurice March, M.D. Noralyn Pick. Eden Emms, MD, [facility]

## 2010-09-06 ENCOUNTER — Emergency Department (HOSPITAL_COMMUNITY): Payer: Medicare Other

## 2010-09-06 ENCOUNTER — Inpatient Hospital Stay (HOSPITAL_COMMUNITY)
Admission: EM | Admit: 2010-09-06 | Discharge: 2010-09-12 | DRG: 391 | Disposition: A | Discharge status: Home / Self Care | Payer: Medicare Other | Attending: Internal Medicine | Admitting: Internal Medicine

## 2010-09-06 DIAGNOSIS — K5730 Diverticulosis of large intestine without perforation or abscess without bleeding: Secondary | ICD-10-CM | POA: Diagnosis present

## 2010-09-06 DIAGNOSIS — D649 Anemia, unspecified: Secondary | ICD-10-CM | POA: Diagnosis present

## 2010-09-06 DIAGNOSIS — F329 Major depressive disorder, single episode, unspecified: Secondary | ICD-10-CM | POA: Diagnosis present

## 2010-09-06 DIAGNOSIS — F17200 Nicotine dependence, unspecified, uncomplicated: Secondary | ICD-10-CM | POA: Diagnosis present

## 2010-09-06 DIAGNOSIS — E039 Hypothyroidism, unspecified: Secondary | ICD-10-CM | POA: Diagnosis present

## 2010-09-06 DIAGNOSIS — E1142 Type 2 diabetes mellitus with diabetic polyneuropathy: Secondary | ICD-10-CM | POA: Diagnosis present

## 2010-09-06 DIAGNOSIS — I739 Peripheral vascular disease, unspecified: Secondary | ICD-10-CM | POA: Diagnosis present

## 2010-09-06 DIAGNOSIS — K659 Peritonitis, unspecified: Secondary | ICD-10-CM | POA: Diagnosis present

## 2010-09-06 DIAGNOSIS — J4489 Other specified chronic obstructive pulmonary disease: Secondary | ICD-10-CM | POA: Diagnosis present

## 2010-09-06 DIAGNOSIS — Z86718 Personal history of other venous thrombosis and embolism: Secondary | ICD-10-CM

## 2010-09-06 DIAGNOSIS — J449 Chronic obstructive pulmonary disease, unspecified: Secondary | ICD-10-CM | POA: Diagnosis present

## 2010-09-06 DIAGNOSIS — A09 Infectious gastroenteritis and colitis, unspecified: Principal | ICD-10-CM | POA: Diagnosis present

## 2010-09-06 DIAGNOSIS — K219 Gastro-esophageal reflux disease without esophagitis: Secondary | ICD-10-CM | POA: Diagnosis present

## 2010-09-06 DIAGNOSIS — E1149 Type 2 diabetes mellitus with other diabetic neurological complication: Secondary | ICD-10-CM | POA: Diagnosis present

## 2010-09-06 DIAGNOSIS — Z794 Long term (current) use of insulin: Secondary | ICD-10-CM

## 2010-09-06 DIAGNOSIS — M1990 Unspecified osteoarthritis, unspecified site: Secondary | ICD-10-CM | POA: Diagnosis present

## 2010-09-06 DIAGNOSIS — F3289 Other specified depressive episodes: Secondary | ICD-10-CM | POA: Diagnosis present

## 2010-09-06 DIAGNOSIS — I252 Old myocardial infarction: Secondary | ICD-10-CM

## 2010-09-06 DIAGNOSIS — D126 Benign neoplasm of colon, unspecified: Secondary | ICD-10-CM | POA: Diagnosis present

## 2010-09-06 DIAGNOSIS — E785 Hyperlipidemia, unspecified: Secondary | ICD-10-CM | POA: Diagnosis present

## 2010-09-06 LAB — URINALYSIS, ROUTINE W REFLEX MICROSCOPIC
Bilirubin Urine: NEGATIVE
Glucose, UA: NEGATIVE mg/dL
Specific Gravity, Urine: 1.022 (ref 1.005–1.030)
pH: 8 (ref 5.0–8.0)

## 2010-09-06 LAB — URINE MICROSCOPIC-ADD ON

## 2010-09-07 ENCOUNTER — Inpatient Hospital Stay (HOSPITAL_COMMUNITY): Payer: Medicare Other

## 2010-09-07 LAB — GLUCOSE, CAPILLARY
Glucose-Capillary: 126 mg/dL — ABNORMAL HIGH (ref 70–99)
Glucose-Capillary: 92 mg/dL (ref 70–99)
Glucose-Capillary: 180 mg/dL — ABNORMAL HIGH (ref 70–99)
Glucose-Capillary: 187 mg/dL — ABNORMAL HIGH (ref 70–99)
Glucose-Capillary: 141 mg/dL — ABNORMAL HIGH (ref 70–99)

## 2010-09-07 LAB — CBC
Hemoglobin: 13.9 g/dL (ref 12.0–15.0)
Platelets: 238 10*3/uL (ref 150–400)
RBC: 4.94 MIL/uL (ref 3.87–5.11)

## 2010-09-07 LAB — BASIC METABOLIC PANEL
Chloride: 100 mEq/L (ref 96–112)
Creatinine, Ser: 0.63 mg/dL (ref 0.50–1.10)
GFR calc Af Amer: >60 mL/min (ref >60)
GFR calc non Af Amer: >60 mL/min (ref >60)
Potassium: 3.7 mEq/L (ref 3.5–5.1)

## 2010-09-07 LAB — DIFFERENTIAL
Basophils Absolute: 0 10*3/uL (ref 0.0–0.1)
Basophils Relative: 0 % (ref 0–1)
Eosinophils Absolute: 0.1 10*3/uL (ref 0.0–0.7)
Neutro Abs: 9.1 10*3/uL — ABNORMAL HIGH (ref 1.7–7.7)
Neutrophils Relative %: 83 % — ABNORMAL HIGH (ref 43–77)

## 2010-09-07 LAB — TSH: TSH: 8.867 u[IU]/mL — ABNORMAL HIGH (ref 0.350–4.500)

## 2010-09-07 LAB — PROTIME-INR
INR: 0.91 (ref 0.00–1.49)
Prothrombin Time: 12.5 seconds (ref 11.6–15.2)

## 2010-09-07 MED ORDER — IOHEXOL 300 MG/ML  SOLN: 100.0000 mL | Freq: Once | INTRAMUSCULAR | Status: AC | PRN

## 2010-09-07 MED ORDER — IOHEXOL 300 MG/ML  SOLN
100.0000 mL | Freq: Once | INTRAMUSCULAR | Status: AC | PRN
  Administered 2010-09-07: 100 mL via INTRAVENOUS

## 2010-09-08 DIAGNOSIS — K5289 Other specified noninfective gastroenteritis and colitis: Secondary | ICD-10-CM

## 2010-09-08 DIAGNOSIS — R509 Fever, unspecified: Secondary | ICD-10-CM

## 2010-09-08 LAB — GLUCOSE, CAPILLARY
Glucose-Capillary: 89 mg/dL (ref 70–99)
Glucose-Capillary: 174 mg/dL — ABNORMAL HIGH (ref 70–99)
Glucose-Capillary: 149 mg/dL — ABNORMAL HIGH (ref 70–99)

## 2010-09-08 LAB — HEMOGLOBIN A1C: Mean Plasma Glucose: 171 mg/dL — ABNORMAL HIGH (ref <117)

## 2010-09-08 LAB — CBC
MCH: 27.6 pg (ref 26.0–34.0)
MCHC: 34.4 g/dL (ref 30.0–36.0)
Platelets: 188 10*3/uL (ref 150–400)
RDW: 15.1 % (ref 11.5–15.5)

## 2010-09-08 LAB — GRAM STAIN

## 2010-09-08 LAB — URINE CULTURE: Colony Count: NO GROWTH

## 2010-09-08 LAB — CLOSTRIDIUM DIFFICILE BY PCR: Toxigenic C. Difficile by PCR: NEGATIVE

## 2010-09-09 DIAGNOSIS — K5289 Other specified noninfective gastroenteritis and colitis: Secondary | ICD-10-CM

## 2010-09-09 DIAGNOSIS — R933 Abnormal findings on diagnostic imaging of other parts of digestive tract: Secondary | ICD-10-CM

## 2010-09-09 LAB — COMPREHENSIVE METABOLIC PANEL
ALT: 8 U/L (ref 0–35)
AST: 11 U/L (ref 0–37)
Alkaline Phosphatase: 82 U/L (ref 39–117)
CO2: 29 mEq/L (ref 19–32)
Calcium: 8.2 mg/dL — ABNORMAL LOW (ref 8.4–10.5)
GFR calc non Af Amer: >60 mL/min (ref >60)
Potassium: 4.1 mEq/L (ref 3.5–5.1)
Sodium: 141 mEq/L (ref 135–145)
Total Protein: 5.8 g/dL — ABNORMAL LOW (ref 6.0–8.3)

## 2010-09-09 LAB — CBC
MCH: 27.6 pg (ref 26.0–34.0)
Platelets: 157 10*3/uL (ref 150–400)
RBC: 4.17 MIL/uL (ref 3.87–5.11)

## 2010-09-09 LAB — GLUCOSE, CAPILLARY
Glucose-Capillary: 103 mg/dL — ABNORMAL HIGH (ref 70–99)
Glucose-Capillary: 125 mg/dL — ABNORMAL HIGH (ref 70–99)

## 2010-09-09 LAB — GIARDIA/CRYPTOSPORIDIUM SCREEN(EIA)
Cryptosporidium Screen (EIA): NEGATIVE
Giardia Screen - EIA: NEGATIVE

## 2010-09-09 LAB — FECAL LACTOFERRIN, QUANT: Fecal Lactoferrin: POSITIVE

## 2010-09-10 DIAGNOSIS — R197 Diarrhea, unspecified: Secondary | ICD-10-CM

## 2010-09-10 DIAGNOSIS — D126 Benign neoplasm of colon, unspecified: Secondary | ICD-10-CM

## 2010-09-10 DIAGNOSIS — K5730 Diverticulosis of large intestine without perforation or abscess without bleeding: Secondary | ICD-10-CM

## 2010-09-10 DIAGNOSIS — R933 Abnormal findings on diagnostic imaging of other parts of digestive tract: Secondary | ICD-10-CM

## 2010-09-10 LAB — GLUCOSE, CAPILLARY
Glucose-Capillary: 73 mg/dL (ref 70–99)
Glucose-Capillary: 69 mg/dL — ABNORMAL LOW (ref 70–99)
Glucose-Capillary: 131 mg/dL — ABNORMAL HIGH (ref 70–99)
Glucose-Capillary: 75 mg/dL (ref 70–99)
Glucose-Capillary: 96 mg/dL (ref 70–99)
Glucose-Capillary: 149 mg/dL — ABNORMAL HIGH (ref 70–99)

## 2010-09-11 ENCOUNTER — Other Ambulatory Visit: Payer: Self-pay | Admitting: Internal Medicine

## 2010-09-11 DIAGNOSIS — K55059 Acute (reversible) ischemia of intestine, part and extent unspecified: Secondary | ICD-10-CM

## 2010-09-11 DIAGNOSIS — K635 Polyp of colon: Secondary | ICD-10-CM

## 2010-09-11 DIAGNOSIS — R933 Abnormal findings on diagnostic imaging of other parts of digestive tract: Secondary | ICD-10-CM

## 2010-09-11 DIAGNOSIS — D126 Benign neoplasm of colon, unspecified: Secondary | ICD-10-CM

## 2010-09-11 DIAGNOSIS — K5289 Other specified noninfective gastroenteritis and colitis: Secondary | ICD-10-CM

## 2010-09-11 DIAGNOSIS — R509 Fever, unspecified: Secondary | ICD-10-CM

## 2010-09-11 HISTORY — DX: Polyp of colon: K63.5

## 2010-09-11 LAB — GLUCOSE, CAPILLARY
Glucose-Capillary: 88 mg/dL (ref 70–99)
Glucose-Capillary: 119 mg/dL — ABNORMAL HIGH (ref 70–99)
Glucose-Capillary: 194 mg/dL — ABNORMAL HIGH (ref 70–99)
Glucose-Capillary: 107 mg/dL — ABNORMAL HIGH (ref 70–99)
Glucose-Capillary: 174 mg/dL — ABNORMAL HIGH (ref 70–99)

## 2010-09-11 LAB — CBC
HCT: 32.7 % — ABNORMAL LOW (ref 36.0–46.0)
Platelets: 192 10*3/uL (ref 150–400)
RBC: 4.03 MIL/uL (ref 3.87–5.11)
RDW: 14.8 % (ref 11.5–15.5)
WBC: 6.6 10*3/uL (ref 4.0–10.5)

## 2010-09-12 DIAGNOSIS — K551 Chronic vascular disorders of intestine: Secondary | ICD-10-CM

## 2010-09-12 LAB — VITAMIN B12: Vitamin B-12: 537 pg/mL (ref 211–911)

## 2010-09-12 LAB — FERRITIN: Ferritin: 98 ng/mL (ref 10–291)

## 2010-09-12 LAB — IRON AND TIBC
Saturation Ratios: 28 % (ref 20–55)
UIBC: 144 ug/dL

## 2010-09-12 LAB — GLUCOSE, CAPILLARY
Glucose-Capillary: 114 mg/dL — ABNORMAL HIGH (ref 70–99)
Glucose-Capillary: 77 mg/dL (ref 70–99)

## 2010-09-12 LAB — FOLATE: Folate: 6.1 ng/mL

## 2010-09-13 LAB — CULTURE, BLOOD (ROUTINE X 2)
Culture  Setup Time: 201207152359
Culture: NO GROWTH
Culture: NO GROWTH

## 2010-09-16 ENCOUNTER — Encounter: Payer: Self-pay | Admitting: Internal Medicine

## 2010-09-17 NOTE — Consult Note (Signed)
NAMEMarland Kitchen  KENNIS, BUELL           ACCOUNT NO.:  [id number]  MEDICAL RECORD NO.:  [id number]  LOCATION:  5532                         FACILITY:  [facility]  PHYSICIAN:  Di Kindle. Edilia Bo, M.D.DATE OF BIRTH:  May 14, 1945  DATE OF CONSULTATION:  09/11/2010 DATE OF DISCHARGE:                                CONSULTATION   REQUESTING PHYSICIAN:  Dr. Jomarie Longs, [facility].  REASON FOR CONSULTATION:  Possible ischemic colitis.  HISTORY:  This is a pleasant 65 year old woman who had undergone a left fem-pop bypass graft by Dr. Darrick Penna on April 26, 2010, for disabling claudication.  She had done well from this standpoint, was actually scheduled to be seen in the office today.  She was admitted to the hospital on September 06, 2010, because she had had a fever to 103 at home and was having a right-sided abdominal pain.  Her workup included a CT scan of the abdomen and pelvis which was performed on September 08, 2010, and this showed evidence of diffuse right-sided colitis.  She has been evaluated by Infectious Disease/Dr. Orvan Falconer and medical team is concerned about possible ischemic colitis.  She states that the pain in her right lower abdomen began in March.  It was gradual in onset and is described as sharp, intermittent pain.  She states that the pain comes and goes.  The only aggravating factor that she can note is that it does appear to be postprandial especially if she eats too much.  There are really no alleviating factors.  Initially, she had had some diarrhea but this has resolved.  She has had no associated nausea and vomiting recently.  Her pain has been gradually improving.  She has lost over 40 pounds in the last year and since March when she began having pain has lost approximately 20 pounds.  Vascular Surgery was consulted because of the possibility of ischemic colitis.  PAST MEDICAL HISTORY:  Significant for: 1. Insulin-dependent diabetes. 2. Hyperlipidemia. 3. Peripheral  vascular disease. 4. History of diabetic neuropathy. 5. History of hypothyroidism. She denies any history of myocardial infarction, history of congestive heart failure, history of COPD.  SOCIAL HISTORY:  She is widowed.  She has two children.  She had smoked a pack per day of cigarettes from age 65 but had cut back significantly, now she is trying to quit.  FAMILY HISTORY:  She had a sister who had coronary disease in her 36s. She is unaware of any other history of premature cardiovascular disease.  REVIEW OF SYSTEMS:  GENERAL:  She has had no recent weight gain.  She has had weight loss as described above.  She has had some loss of appetite.  She had fever on admission, her fever has resolved. CARDIOVASCULAR:  She has had no chest pain, chest pressure, palpitations, or arrhythmias.  She has had a history of DVT or phlebitis.  She has had no history of stroke or TIA.  PULMONARY:  She has had no productive cough, bronchitis, asthma, or wheezing. GASTROINTESTINAL:  She has a history of reflux.  She has had some diarrhea.  She has had the abdominal pain as described above.  She has had no blood in her stool with no history of peptic  ulcer disease. NEUROLOGIC:  She has occasional dizziness. GU, musculoskeletal, psychiatric, ENT, hematologic, integumentary review of systems is unremarkable and is documented on the medical history form in her chart.  PHYSICAL EXAMINATION:  GENERAL:  This is a pleasant 65 year old woman who appears her stated age. VITAL SIGNS:  Temperature is 98.3, heart rate 65, blood pressure 152/68, saturation 96%. HEENT:  Unremarkable. LUNGS:  Clear bilaterally to auscultation without rales, rhonchi, or wheezing. CARDIOVASCULAR:  I do not detect any carotid bruits.  She has a regular rate and rhythm.  She has palpable femoral pulses and a palpable left popliteal pulse.  I cannot palpate pedal pulses, however, both feet are warm, well perfused without ischemic  ulcers. ABDOMEN:  Soft with normal-pitched bowel sounds.  She has some mild right-sided tenderness. MUSCULOSKELETAL:  No major deformities or cyanosis. NEUROLOGIC:  She has no focal weakness, paresthesias. SKIN:  There are no ulcers or rashes.  LABORATORY EVALUATION:  Reveals a white count of 6.6, H and H of 11 and 32.  Hemoglobin A1c is 7.6.  Her creatinine is 0.63.  C. diff was negative.  I have reviewed her arteriogram from March prior to her fem- pop bypass.  On AP projection, there is filling of the superior mesenteric artery, celiac axis, and IMA.  It appears to be fairly brisk, however, the origin of the vessels cannot be adequately assessed from the AP projection.  I have reviewed her CT scan here in the hospital and again, it appears that the celiac, SMA, and IMA are patent although there may be some plaque noted in the proximal celiac and proximal SMA although it is difficult to assess adequately.  IMPRESSION:  This patient presents with right-sided colitis with postprandial abdominal pain and weight loss and certainly the differential diagnosis would include mesenteric ischemia, although this is somewhat of an unusual presentation for this given that she may have some proximal disease of her celiac and superior mesenteric artery, she could perhaps have had some type of an embolic event to the right colon. I am not convinced that she has chronic mesenteric ischemia.  She describes the postprandial pain is occurring when she eats too much. Regardless, I think the way to really sort this out would be to proceed with a formal mesenteric arteriogram and I have discussed this with Dr. Darrick Penna, and he is willing to proceed with this test tomorrow.  We will make further recommendations pending these results.  Of note, her Clostridium difficile was negative.  Regardless, her symptoms appear to be improving.  Her white count is normal, her fever has resolved.  We will  follow.     Di Kindle. Edilia Bo, M.D.     CSD/MEDQ  D:  09/11/2010  T:  09/12/2010  Job:  [id number]  cc:   Dr. Jomarie Longs, [facility]  Electronically Signed by Waverly Ferrari M.D. on 09/17/2010 11:10:41 AM

## 2010-09-22 NOTE — H&P (Signed)
NAME:  April Branch, HIMMELBERGER NO.:  [id number]  MEDICAL RECORD NO.:  [id number]  LOCATION:  5532                         FACILITY:  [facility]  PHYSICIAN:  Houston Siren, MD           DATE OF BIRTH:  [birth year]  DATE OF ADMISSION:  09/06/2010 DATE OF DISCHARGE:                             HISTORY & PHYSICAL   PRIMARY CARE PHYSICIAN:  None.  VASCULAR SURGERY:  [name] Fields, MD  ADVANCE DIRECTIVE:  Full code.  REASON FOR ADMISSION:  Fever and chills of unclear source.  HISTORY OF PRESENT ILLNESS:  This is a 65 year old female with history of diabetes; peripheral vascular disease, status post left femoral bypass with postop infection; history of kidney cancer; severe peripheral neuropathy; hyperlipidemia; hypothyroidism, presented to the emergency room because of lower legs burning and severe pain. She states that she can feel them.  These symptoms have resolved since she came to the emergency room, but evaluation showed that she does have a low-grade temperature of 100.2.  She states that at home she was having temperature up to 103.  She was having rigor and chills in the emergency room.  She denied cough, nausea, vomiting or headache, but admitted to having mild abdominal cramps and diarrhea.  She also has mild sore throat.  She denied any distant travel or any ill contacts. She has no earache.  There has been no rash, and she has no arthralgia. Other pertinent workup included a urinalysis with 11-20 rbc's and mild leukocytosis with white count 11,000.  Her chest x-ray is clear.  She was given Dilaudid, fentanyl, and a dose of Rocephin and Hospitalist was asked to admit the patient because of a fever in a diabetic patient.  PAST MEDICAL HISTORY: 1. Diabetes. 2. Tobacco use. 3. Status post cholecystectomy. 4. Hyperlipidemia. 5. Peripheral vascular disease. 6. Ischemic colitis. 7. Depression. 8. Diabetic neuropathy. 9. Hypothyroidism.  PAST SURGICAL HISTORY:   Several orthopedic surgery for bone spur.  ALLERGIES: 1. CODEINE. 2. NIACIN. 3. SIMVASTATIN.  CURRENT MEDICATIONS:  Ativan, Lantus, metformin, NovoLog, pravastatin, and Synthroid.  SOCIAL HISTORY:  The patient lives at home with her son.  She had significant tobacco use in the past.  Denies alcohol or illicit drug use.  REVIEW OF SYSTEMS:  Otherwise unremarkable.  PHYSICAL EXAMINATION:  VITAL SIGNS:  Blood pressure is 150/70, pulse of 80, respiratory rate 24, temperature 100.2. GENERAL:  She is alert and oriented and is in no apparent distress.  She has facial symmetry and fluent speech.  Tongue is midline.  Throat is clear.  No sinus tenderness. CARDIAC:  Revealed S1-S2 regular. LUNGS:  Clear with scattered rhonchi. ABDOMEN:  Slightly tender diffusely.  No rebound.  No palpable mass. Bowel sounds present. Extremities:  Warm.  No evidence of ischemia.  No calf tenderness. SKIN:  Warm and dry. NEUROLOGIC:  Unremarkable as well. PSYCHIATRIC:  Unremarkable as well.  OBJECTIVE FINDINGS:  White count 11,000.  UA shows RBCs.  Chest x-ray is clear.  Creatinine is normal.  IMPRESSION:  This is a 65 year old female with diabetes, peripheral vascular disease, question of kidney cancer, hypercholesterolemia, hypothyroidism, presents with fever of unclear source and diarrhea.  She was on  antibiotics before, so she could have Clostridium difficile colitis.  She was given Rocephin for question of urinary tract infection, but I do not think this is a source.  We will start her on Flagyl and send stool for Clostridium difficile and panculture her for blood and urine.  We will not give her any other antibiotics pending the results of her cultures.  I will give her some pain medication for her diabetic neuropathy while she is in the hospital, but as outpatient perhaps some other medications like  Neurontin or TCA may be better served.  I will continue all her medications once they are  reconciled.  She is a full code, stable, and will be admitted to [facility] 4.  If she is not better, consider getting an abdominal CT.     Houston Siren, MD     PL/MEDQ  D:  09/07/2010  T:  09/07/2010  Job:  [id number]  Electronically Signed by Houston Siren  on 09/22/2010 03:13:07 AM

## 2010-09-25 NOTE — Discharge Summary (Signed)
NAME:  April Branch, KNIGHTON NO.:  [id number]  MEDICAL RECORD NO.:  [id number]  LOCATION:  5532                         FACILITY:  [facility]  PHYSICIAN:  Zannie Cove, MD     DATE OF BIRTH:  March 05, 1945  DATE OF ADMISSION:  09/06/2010 DATE OF DISCHARGE:  09/12/2010                              DISCHARGE SUMMARY   PRIMARY CARE PHYSICIAN:  Dr. Lerry Liner.  VASCULAR SURGEON:  Dr. Fabienne Bruns.  DISCHARGE DIAGNOSES: 1. Colitis most likely infectious. 2. Type 2 diabetes. 3. History of tobacco use. 4. Peripheral vascular disease. 5. History of left femoral popliteal bypass in April 2012. 6. Mild chronic anemia. 7. Dyslipidemia. 8. Depression. 9. Hypothyroidism. 10.Diabetic peripheral neuropathy. 11.History of cholecystectomy. 12.Severe diverticulosis.  DISCHARGE MEDICATIONS: 1. Tylenol 60 mg q.4 h. p.r.n. 2. Ciprofloxacin 500 mg p.o. b.i.d. for 8 days. 3. Flagyl 500 mg p.o. t.i.d. for 8 days. 4. Percocet 5/325 1-2 tablets q.6 h. p.r.n. 5. Amitriptyline 50 mg p.o. nightly. 6. Cyclobenzaprine 5 mg p.o. b.i.d. 7. Hydrocodone/APAP 10/500 one tablet q.6 h. p.r.n. pain. 8. Lantus 60 units subcu nightly. 9. Lorazepam 1 mg p.o. daily. 10.Metformin 500 mg p.o. b.i.d. to be restarted after 2 days i.e. from     September 14, 2010. 11.NovoLog 5 units t.i.d. with meals. 12.Pravastatin 20 mg 1-1/2 tablets p.o. nightly. 13.Synthroid 75 mcg daily.  CONSULTANTS: 1. Dr. Orvan Falconer, Infectious Disease. 2. Dr. Waverly Ferrari and Dr. Leonette Most feels with Infectious     Disease. 3. Dr. Lina Sar, [facility].  DIAGNOSTICS INVESTIGATIONS: 1. Chest x-ray on July 15 showed mild cardiomegaly, chronic lung     changes, stable.  CT of the abdomen and pelvis on July 16 showed     diffuse right-sided colitis, fluid in the pelvis related to colitis     involving the left kidney secondary to prior exam, splenic 1.1 cm     low density, not appreciated on previous exam of  undetermined     significance fatty liver. 2. Colonoscopy done by Dr. Lina Sar, on September 10, 2010, showed     severe diverticulosis in the sigmoid colon, two polyps in the right     colon, status post polypectomy, biopsy pending. 3. Mesenteric angiogram by Dr. Fabienne Bruns, on September 12, 2010 which     showed 50% celiac, 50% IMA, and 0% SMA disease.  HOSPITAL COURSE:  Ms. April Branch is a very pleasant 65 year old white female who presented to the hospital with severe abdominal pain and diarrhea.  She had a CT done admission consistent with severe colitis. 1. Colitis was initially felt to be ischemic in etiology since the     patient is vascular path with significant peripheral vascular     disease elsewhere.  However, she was empirically covered with     ciprofloxacin and Flagyl which she has received already for 6 days     and has had good clinical response.  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  Hence, most likely etiology is infectious.  In     addition, the patient also had a colonoscopy which was done 3 days     into her hospital stay and by that time has been unremarkable     except for diffuse diverticulosis.  At this point, plan is for the     patient to complete 8 more days of antibiotics and follow up with     Dr. Juanda Chance in a month on a p.r.n. basis. 2. Mild anemia.  Suspect secondary to acute illness, colonoscopy as     noted above unremarkable, defer anemia panel and further workup of     this to her primary physician at the time of followup. 3. History of peripheral vascular disease and with history of fem-pop     bypass back in April, 2012, stable.  DISCHARGE CONDITION:  Stable.  VITAL SIGNS ON DISCHARGE:  Temperature is 98.3, pulse 90, blood  pressure 158/68, respirations 18, and satting 96% on room air.  DISCHARGE FOLLOWUP: 1. Primary physician, Dr. Lerry Liner in 7-10 days. 2. Dr. Lina Sar in 1 month's time.  Also of note, the patient has been advised not to resume her metformin until 48 hours after angiogram which was done today.     Zannie Cove, MD     PJ/MEDQ  D:  09/12/2010  T:  09/12/2010  Job:  [id number]  cc:   Lerry Liner, MD Hedwig Morton. Juanda Chance, MD  Electronically Signed by Zannie Cove  on 09/25/2010 04:54:09 PM

## 2010-10-23 NOTE — Op Note (Signed)
NAME:  April Branch, April Branch           ACCOUNT NO.:  [id number]  MEDICAL RECORD NO.:  [id number]  LOCATION:                                 FACILITY:  PHYSICIAN:  [name] Fields, MD  DATE OF BIRTH:  [birth year]  DATE OF PROCEDURE:  09/12/2010 DATE OF DISCHARGE:                              OPERATIVE REPORT   PROCEDURE: 1. Abdominal aortogram. 2. Selective superior mesenteric artery and celiac artery angiogram.  PREOPERATIVE DIAGNOSIS:  Abdominal pain.  POSTOPERATIVE DIAGNOSIS:  Abdominal pain.  ANESTHESIA:  Local with IV sedation.  OPERATIVE DETAILS:  After obtaining informed consent, the patient was brought to the [facility] lab.  The patient was placed in supine position on the angio table.  Both groins were prepped and draped in usual sterile fashion.  Local anesthesia was infiltrated over the right common femoral artery.  An introducer needle was used to cannulate the right common femoral artery and a 0.03 Versacore wire threaded into the abdominal aorta under fluoroscopic guidance.  Next, a 5-French sheath was placed over the guidewire in the right common femoral artery, and this was thoroughly flushed with heparinized saline.  A 5-French pigtail catheter was then placed over the guidewire up in the abdominal aorta, and a lateral abdominal aortogram obtained.  This shows normal anatomic configuration of the mesenteric vessels.  There is a 50% stenosis at the origin of the celiac artery.  The superior mesenteric artery is widely patent at its origin.  Next, the pigtail catheter was removed over a guide wire exchanged for a 5-French Sauce catheter.  This was used to selectively catheterize the origin of the celiac artery.  This was done without difficulty. Contrast angiogram was then obtained with the catheter engaged in the celiac artery.  This shows normal anatomic configuration of the celiac. The common hepatic artery is widely patent.  Splenic artery is patent. Left gastric  artery is patent.  Next, the catheter was disengaged from the celiac artery.  The guidewire was brought back up on the operative field and advanced through the catheter to selectively catheterize the superior mesenteric artery, and a contrast angiogram was obtained through this.  The celiac artery and SMA injections were both performed in a slightly oblique projection. The superior mesenteric artery is widely patent with brisk filling of all of its arterial arcades.  The Sauce catheter was then disengaged from the superior mesenteric artery and brought down just above the aortic bifurcation and a RAO view was obtained to examine the inferior mesenteric artery.  The inferior mesenteric artery overall was fairly small.  There was a 50% stenosis at the origin of this.  However, it is patent throughout its course.  The Sauce Omni catheter was then straightened over the guidewire and removed.  The 5-French sheath was thoroughly flushed with heparinized saline to be pulled in the holding area.  The patient tolerated the procedure well and there were no complications.  The patient was taken to holding area in stable condition.  OPERATIVE FINDINGS: 1. 50% stenosis, origin is celiac artery with patent distal branches. 2. No significant stenosis, superior mesenteric artery with brisk     filling of all distal branches. 3. 50% stenosis of inferior  mesenteric artery.     [name] Fields, MD     CEF/MEDQ  D:  09/12/2010  T:  09/12/2010  Job:  [id number]  Electronically Signed by Fabienne Bruns MD on 10/23/2010 06:26:30 PM

## 2010-10-28 ENCOUNTER — Encounter: Payer: Self-pay | Admitting: Vascular Surgery

## 2010-11-20 ENCOUNTER — Other Ambulatory Visit: Payer: Self-pay | Admitting: Specialist

## 2010-11-20 ENCOUNTER — Ambulatory Visit
Admission: RE | Admit: 2010-11-20 | Discharge: 2010-11-20 | Disposition: A | Discharge status: Home / Self Care | Payer: Medicare Other | Source: Ambulatory Visit | Attending: Specialist | Admitting: Specialist

## 2010-11-21 LAB — COMPREHENSIVE METABOLIC PANEL
ALT: 30
Albumin: 3.8
Alkaline Phosphatase: 117
Potassium: 5.5 — ABNORMAL HIGH
Sodium: 136
Total Protein: 7.6

## 2010-11-21 LAB — CBC
Platelets: 380
RDW: 13.9

## 2010-11-21 LAB — DIFFERENTIAL
Basophils Relative: 1
Eosinophils Absolute: 0
Eosinophils Relative: 0
Lymphocytes Relative: 4 — ABNORMAL LOW
Monocytes Absolute: 0.9
Neutro Abs: 21.2 — ABNORMAL HIGH

## 2010-11-26 LAB — BASIC METABOLIC PANEL
CO2: 27
Chloride: 102
Creatinine, Ser: 0.69
GFR calc Af Amer: >60
Potassium: 3.7

## 2010-11-26 LAB — CBC
HCT: 32.2 — ABNORMAL LOW
Hemoglobin: 11 — ABNORMAL LOW
MCHC: 34.1
MCV: 85.1
RBC: 3.78 — ABNORMAL LOW

## 2010-11-26 LAB — GLUCOSE, CAPILLARY: Glucose-Capillary: 103 — ABNORMAL HIGH

## 2010-12-03 ENCOUNTER — Encounter: Payer: Self-pay | Admitting: Vascular Surgery

## 2010-12-04 ENCOUNTER — Ambulatory Visit: Payer: Medicare Other | Admitting: Vascular Surgery

## 2010-12-09 LAB — CBC
Hemoglobin: 11.1 — ABNORMAL LOW
MCHC: 32.8
RBC: 4.26
RBC: 5
RDW: 18.6 — ABNORMAL HIGH
WBC: 11.9 — ABNORMAL HIGH
WBC: 7.7

## 2010-12-09 LAB — BASIC METABOLIC PANEL
Calcium: 9.1
GFR calc Af Amer: >60
GFR calc non Af Amer: 55 — ABNORMAL LOW
Potassium: 3.9
Sodium: 138

## 2010-12-09 LAB — URINALYSIS, ROUTINE W REFLEX MICROSCOPIC
Bilirubin Urine: NEGATIVE
Glucose, UA: NEGATIVE
Hgb urine dipstick: NEGATIVE
Specific Gravity, Urine: 1.016
pH: 6.5

## 2010-12-09 LAB — TYPE AND SCREEN: Antibody Screen: NEGATIVE

## 2010-12-09 LAB — ABO/RH: ABO/RH(D): O POS

## 2010-12-09 LAB — URINE MICROSCOPIC-ADD ON

## 2011-01-28 ENCOUNTER — Encounter: Payer: Self-pay | Admitting: Vascular Surgery

## 2011-01-29 ENCOUNTER — Other Ambulatory Visit (INDEPENDENT_AMBULATORY_CARE_PROVIDER_SITE_OTHER): Payer: Medicare Other | Admitting: *Deleted

## 2011-01-29 ENCOUNTER — Ambulatory Visit (INDEPENDENT_AMBULATORY_CARE_PROVIDER_SITE_OTHER): Payer: Medicare Other | Admitting: Vascular Surgery

## 2011-01-29 ENCOUNTER — Encounter: Payer: Self-pay | Admitting: Vascular Surgery

## 2011-01-29 ENCOUNTER — Ambulatory Visit (INDEPENDENT_AMBULATORY_CARE_PROVIDER_SITE_OTHER): Payer: Medicare Other | Admitting: *Deleted

## 2011-01-29 VITALS — BP 126/80 | HR 81 | Resp 16 | Ht 62.0 in | Wt 156.0 lb

## 2011-01-29 DIAGNOSIS — I739 Peripheral vascular disease, unspecified: Secondary | ICD-10-CM

## 2011-01-29 DIAGNOSIS — Z48812 Encounter for surgical aftercare following surgery on the circulatory system: Secondary | ICD-10-CM

## 2011-01-29 NOTE — Progress Notes (Signed)
[facility] [place] HISTORY AND PHYSICAL   History of Present Illness:  Patient is a 65 y.o. year old female who presents for follow-up evaluation for PAD.  She is on Aspirin for antiplatelet therapy. Her atherosclerotic risk factors remain diabetes, elevated cholesterol, hypertension, smoking (not actively), and coronary artery disease.  These are all currently stable and followed by the primary care physician.  The patient currently has claudication symptoms that occur in both legs at 1-2 block distance.  The patient denies rest pain or ulcers on the feet.  Past Medical History  Diagnosis Date  . Hyperlipidemia   . Chronic kidney disease   . Arthritis   . Depression   . Leg pain   . GERD (gastroesophageal reflux disease)   . Joint pain   . Dizziness   . Anxiety   . Myocardial infarction age 44  . 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 Pulmonary vascular disease     Past Surgical History  Procedure Date  . Pr vein bypass graft,aorto-fem-pop 05/27/2010  . Appendectomy   . Abdominal hysterectomy   . Knee surgery   . Cholecystectomy   . Lung surgery     lung nodule removed from the right side  . Nephrectomy     left partial    Review of Systems:  Neurologic: sensation in the feet is decreased secondary to chronic peripheral neuropathy Cardiac:denies shortness of breath or chest pain Pulmonary: denies cough or wheeze  Social History History  Substance Use Topics  . Smoking status: Current Everyday Smoker -- 0.5 packs/day for 50 years    Types: Cigarettes  . Smokeless tobacco: Not on file  . Alcohol Use: No    Allergies  Allergies  Allergen Reactions  . JXB:JYNWGNFAOZH+YQMVHQION+GEXBMWUXLK Acid+Aspartame     REACTION: Rash  . Simcor Hives     Current Outpatient Prescriptions  Medication Sig Dispense Refill  . amitriptyline (ELAVIL) 100 MG tablet Take 100 mg by mouth at bedtime.        . clopidogrel (PLAVIX) 75 MG tablet Take 75  mg by mouth daily.        . cyclobenzaprine (FLEXERIL) 5 MG tablet Take 5 mg by mouth 2 (two) times daily as needed.        . Gabapentin (NEURONTIN PO) Take by mouth. New medicine, pt doesn't remember dosage       . insulin aspart (NOVOLOG) 100 UNIT/ML injection Inject 4-6 Units into the skin 3 (three) times daily before meals.        . insulin glargine (LANTUS) 100 UNIT/ML injection Inject 60 Units into the skin at bedtime.        . lansoprazole (PREVACID) 30 MG capsule Take 30 mg by mouth daily.        Marland Kitchen levothyroxine (SYNTHROID, LEVOTHROID) 125 MCG tablet Take 125 mcg by mouth daily.        Marland Kitchen LORazepam (ATIVAN) 1 MG tablet Take 1 mg by mouth 3 (three) times daily.        . metFORMIN (GLUMETZA) 500 MG (MOD) 24 hr tablet Take 800 mg by mouth 2 (two) times daily with a meal.       . tolterodine (DETROL LA) 4 MG 24 hr capsule Take 4 mg by mouth daily.          Physical Examination  Filed Vitals:   01/29/11 1540  BP: 126/80  Pulse: 81  Resp: 16  Height: 5\' 2"  (1.575 m)  Weight:  156 lb (70.761 kg)  SpO2: 100%    Body mass index is 28.53 kg/(m^2).  General:  Alert and oriented, no acute distress HEENT: Normal Neck: No bruit or JVD Pulmonary: Clear to auscultation bilaterally Cardiac: Regular Rate and Rhythm without murmur Neurologic: Upper and lower extremity motor 5/5 and symmetric Extremities:  2+ femoral absent popliteal and pedal pulses Skin: no ulcer or rash  DATA: She had bilateral ABIs today which were 0.7 bilaterally. Duplex ultrasound shows that her left thumb above-knee pop bypass graft is occluded. I reviewed and interpreted her study.   ASSESSMENT: Bilateral lower extremity claudication. I discussed with the patient today that currently she has reasonable perfusion that she is not at risk for limb loss. She was counseled against smoking as this would greatly increased risk of limb loss long-term. She was also counseled to control her diabetes. Believe the best option for  now would be conservative management with a walking program risk factor modification. If her lower extremity symptoms progressed over time to rest pain or tissue loss and we would consider a redo bypass. The patient also is not interested in any further operations at this point. She agrees with the above plan. She will followup with repeat ABIs in 6 months time.    Fabienne Bruns, MD [facility] [place] Office: [phone] Pager: [phone]

## 2011-02-18 ENCOUNTER — Telehealth: Payer: Self-pay | Admitting: Gastroenterology

## 2011-02-18 ENCOUNTER — Ambulatory Visit: Payer: Medicare Other | Admitting: Gastroenterology

## 2011-02-18 NOTE — Telephone Encounter (Signed)
Do not charge  

## 2011-03-03 ENCOUNTER — Ambulatory Visit: Payer: Medicare Other | Admitting: Gastroenterology

## 2011-03-18 NOTE — Procedures (Unsigned)
BYPASS GRAFT EVALUATION  INDICATION:  Follow up left fem-pop graft.  HISTORY: Diabetes:  Yes. Cardiac:  No. Hypertension:  No. Smoking:  Previous. Previous Surgery:  Left fem-pop graft, 05/27/10.  SINGLE LEVEL ARTERIAL EXAM                              RIGHT              LEFT Brachial: Anterior tibial: Posterior tibial: Peroneal: Ankle/brachial index:        0.69               0.71  PREVIOUS ABI:  Date: 07/03/10  RIGHT:  1.01  LEFT:  1.03  LOWER EXTREMITY BYPASS GRAFT DUPLEX EXAM:  DUPLEX: 1. Occluded left fem-pop graft. 2. Popliteal and distal SFA are patent and reconstitute via profunda     femoral artery collaterals.  IMPRESSION:  Occluded left femoropopliteal graft.  ___________________________________________ [name] Fields, MD  LT/MEDQ  D:  01/29/2011  T:  01/29/2011  Job:  [id number]

## 2011-03-19 ENCOUNTER — Telehealth: Payer: Self-pay | Admitting: Gastroenterology

## 2011-03-19 NOTE — Telephone Encounter (Signed)
Message copied by Arna Snipe on Thu Mar 19, 2011  5:27 PM ------      Message from: Donata Duff      Created: Tue Mar 03, 2011  4:05 PM       Do not bill

## 2011-05-09 ENCOUNTER — Inpatient Hospital Stay (HOSPITAL_COMMUNITY)
Admission: EM | Admit: 2011-05-09 | Discharge: 2011-05-12 | DRG: 206 | Disposition: A | Discharge status: Home / Self Care | Payer: Medicare Other | Source: Ambulatory Visit | Attending: Internal Medicine | Admitting: Internal Medicine

## 2011-05-09 ENCOUNTER — Emergency Department (HOSPITAL_COMMUNITY): Payer: Medicare Other

## 2011-05-09 ENCOUNTER — Other Ambulatory Visit: Payer: Self-pay

## 2011-05-09 ENCOUNTER — Encounter (HOSPITAL_COMMUNITY): Payer: Self-pay | Admitting: *Deleted

## 2011-05-09 DIAGNOSIS — K219 Gastro-esophageal reflux disease without esophagitis: Secondary | ICD-10-CM | POA: Diagnosis present

## 2011-05-09 DIAGNOSIS — I200 Unstable angina: Secondary | ICD-10-CM

## 2011-05-09 DIAGNOSIS — I739 Peripheral vascular disease, unspecified: Secondary | ICD-10-CM | POA: Diagnosis present

## 2011-05-09 DIAGNOSIS — I2510 Atherosclerotic heart disease of native coronary artery without angina pectoris: Secondary | ICD-10-CM | POA: Diagnosis present

## 2011-05-09 DIAGNOSIS — E119 Type 2 diabetes mellitus without complications: Secondary | ICD-10-CM | POA: Diagnosis present

## 2011-05-09 DIAGNOSIS — I129 Hypertensive chronic kidney disease with stage 1 through stage 4 chronic kidney disease, or unspecified chronic kidney disease: Secondary | ICD-10-CM | POA: Diagnosis present

## 2011-05-09 DIAGNOSIS — Z85528 Personal history of other malignant neoplasm of kidney: Secondary | ICD-10-CM

## 2011-05-09 DIAGNOSIS — E669 Obesity, unspecified: Secondary | ICD-10-CM | POA: Diagnosis present

## 2011-05-09 DIAGNOSIS — Z794 Long term (current) use of insulin: Secondary | ICD-10-CM

## 2011-05-09 DIAGNOSIS — Z905 Acquired absence of kidney: Secondary | ICD-10-CM

## 2011-05-09 DIAGNOSIS — M129 Arthropathy, unspecified: Secondary | ICD-10-CM | POA: Diagnosis present

## 2011-05-09 DIAGNOSIS — J449 Chronic obstructive pulmonary disease, unspecified: Secondary | ICD-10-CM | POA: Diagnosis present

## 2011-05-09 DIAGNOSIS — F341 Dysthymic disorder: Secondary | ICD-10-CM | POA: Diagnosis present

## 2011-05-09 DIAGNOSIS — I10 Essential (primary) hypertension: Secondary | ICD-10-CM | POA: Diagnosis present

## 2011-05-09 DIAGNOSIS — Z7902 Long term (current) use of antithrombotics/antiplatelets: Secondary | ICD-10-CM

## 2011-05-09 DIAGNOSIS — N189 Chronic kidney disease, unspecified: Secondary | ICD-10-CM | POA: Diagnosis present

## 2011-05-09 DIAGNOSIS — M940 Chondrocostal junction syndrome [Tietze]: Principal | ICD-10-CM | POA: Diagnosis present

## 2011-05-09 DIAGNOSIS — I798 Other disorders of arteries, arterioles and capillaries in diseases classified elsewhere: Secondary | ICD-10-CM | POA: Diagnosis present

## 2011-05-09 DIAGNOSIS — F17200 Nicotine dependence, unspecified, uncomplicated: Secondary | ICD-10-CM | POA: Diagnosis present

## 2011-05-09 DIAGNOSIS — E1159 Type 2 diabetes mellitus with other circulatory complications: Secondary | ICD-10-CM | POA: Diagnosis present

## 2011-05-09 DIAGNOSIS — E039 Hypothyroidism, unspecified: Secondary | ICD-10-CM | POA: Diagnosis present

## 2011-05-09 DIAGNOSIS — J4489 Other specified chronic obstructive pulmonary disease: Secondary | ICD-10-CM | POA: Diagnosis present

## 2011-05-09 DIAGNOSIS — I252 Old myocardial infarction: Secondary | ICD-10-CM

## 2011-05-09 DIAGNOSIS — E785 Hyperlipidemia, unspecified: Secondary | ICD-10-CM | POA: Diagnosis present

## 2011-05-09 DIAGNOSIS — M519 Unspecified thoracic, thoracolumbar and lumbosacral intervertebral disc disorder: Secondary | ICD-10-CM | POA: Diagnosis present

## 2011-05-09 HISTORY — DX: Peripheral vascular disease, unspecified: I73.9

## 2011-05-09 HISTORY — DX: Gastro-esophageal reflux disease without esophagitis: K21.9

## 2011-05-09 HISTORY — DX: Long term (current) use of insulin: Z79.4

## 2011-05-09 HISTORY — DX: Personal history of other malignant neoplasm of kidney: Z85.528

## 2011-05-09 HISTORY — DX: Type 2 diabetes mellitus without complications: E11.9

## 2011-05-09 HISTORY — DX: Intervertebral disc disorders with myelopathy, lumbar region: M51.06

## 2011-05-09 HISTORY — DX: Chronic obstructive pulmonary disease, unspecified: J44.9

## 2011-05-09 HISTORY — DX: Hypothyroidism, unspecified: E03.9

## 2011-05-09 LAB — CBC
Hemoglobin: 12.7 g/dL (ref 12.0–15.0)
MCV: 81.8 fL (ref 78.0–100.0)
Platelets: 278 10*3/uL (ref 150–400)
RBC: 4.57 MIL/uL (ref 3.87–5.11)
WBC: 9.5 10*3/uL (ref 4.0–10.5)

## 2011-05-09 LAB — DIFFERENTIAL
Eosinophils Relative: 1 % (ref 0–5)
Lymphocytes Relative: 36 % (ref 12–46)
Lymphs Abs: 3.4 10*3/uL (ref 0.7–4.0)
Monocytes Relative: 5 % (ref 3–12)
Neutrophils Relative %: 57 % (ref 43–77)

## 2011-05-09 LAB — COMPREHENSIVE METABOLIC PANEL
ALT: 10 U/L (ref 0–35)
Alkaline Phosphatase: 98 U/L (ref 39–117)
CO2: 24 mEq/L (ref 19–32)
GFR calc Af Amer: 85 mL/min — ABNORMAL LOW (ref >90)
GFR calc non Af Amer: 73 mL/min — ABNORMAL LOW (ref >90)
Glucose, Bld: 208 mg/dL — ABNORMAL HIGH (ref 70–99)
Potassium: 4.3 mEq/L (ref 3.5–5.1)
Sodium: 136 mEq/L (ref 135–145)

## 2011-05-09 LAB — URINALYSIS, ROUTINE W REFLEX MICROSCOPIC
Bilirubin Urine: NEGATIVE
Ketones, ur: NEGATIVE mg/dL
Nitrite: NEGATIVE
Protein, ur: NEGATIVE mg/dL
Urobilinogen, UA: 0.2 mg/dL (ref 0.0–1.0)

## 2011-05-09 LAB — CARDIAC PANEL(CRET KIN+CKTOT+MB+TROPI)
Relative Index: INVALID (ref 0.0–2.5)
Total CK: 32 U/L (ref 7–177)
Troponin I: <0.3 ng/mL (ref <0.30)

## 2011-05-09 LAB — APTT: aPTT: 27 seconds (ref 24–37)

## 2011-05-09 LAB — URINE MICROSCOPIC-ADD ON

## 2011-05-09 MED ORDER — TOLTERODINE TARTRATE ER 4 MG PO CP24
4.0000 mg | ORAL_CAPSULE | Freq: Every day | ORAL | Status: DC
  Administered 2011-05-10 – 2011-05-12 (×2): 4 mg via ORAL
  Filled 2011-05-09 (×3): qty 1

## 2011-05-09 MED ORDER — ALBUTEROL SULFATE (5 MG/ML) 0.5% IN NEBU: 2.5000 mg | INHALATION_SOLUTION | RESPIRATORY_TRACT | Status: DC | PRN

## 2011-05-09 MED ORDER — ONDANSETRON HCL 4 MG/2ML IJ SOLN
4.0000 mg | Freq: Once | INTRAMUSCULAR | Status: AC
  Administered 2011-05-09: 4 mg via INTRAVENOUS
  Filled 2011-05-09: qty 2

## 2011-05-09 MED ORDER — HEPARIN (PORCINE) IN NACL 100-0.45 UNIT/ML-% IJ SOLN
1100.0000 [IU]/h | INTRAMUSCULAR | Status: DC
  Administered 2011-05-10: 1000 [IU]/h via INTRAVENOUS
  Administered 2011-05-10: 1100 [IU]/h via INTRAVENOUS
  Filled 2011-05-09 (×6): qty 250

## 2011-05-09 MED ORDER — LEVOTHYROXINE SODIUM 88 MCG PO TABS
88.0000 ug | ORAL_TABLET | Freq: Every day | ORAL | Status: DC
  Administered 2011-05-10: 88 ug via ORAL
  Filled 2011-05-09 (×2): qty 1

## 2011-05-09 MED ORDER — MORPHINE SULFATE 4 MG/ML IJ SOLN
4.0000 mg | Freq: Once | INTRAMUSCULAR | Status: AC
  Administered 2011-05-09: 4 mg via INTRAVENOUS
  Filled 2011-05-09: qty 1

## 2011-05-09 MED ORDER — HEPARIN BOLUS VIA INFUSION
3500.0000 [IU] | Freq: Once | INTRAVENOUS | Status: AC
  Administered 2011-05-09: 3500 [IU] via INTRAVENOUS

## 2011-05-09 MED ORDER — CYCLOBENZAPRINE HCL 5 MG PO TABS
5.0000 mg | ORAL_TABLET | Freq: Two times a day (BID) | ORAL | Status: DC | PRN
  Administered 2011-05-10: 5 mg via ORAL
  Filled 2011-05-09 (×2): qty 1

## 2011-05-09 MED ORDER — INSULIN ASPART 100 UNIT/ML ~~LOC~~ SOLN: 4.0000 [IU] | Freq: Three times a day (TID) | SUBCUTANEOUS | Status: DC

## 2011-05-09 MED ORDER — SODIUM CHLORIDE 0.9 % IV SOLN: INTRAVENOUS | Status: DC

## 2011-05-09 MED ORDER — NICOTINE 21 MG/24HR TD PT24
21.0000 mg | MEDICATED_PATCH | Freq: Every day | TRANSDERMAL | Status: DC
  Administered 2011-05-10 – 2011-05-12 (×2): 21 mg via TRANSDERMAL
  Filled 2011-05-09 (×3): qty 1

## 2011-05-09 MED ORDER — MORPHINE SULFATE 2 MG/ML IJ SOLN
2.0000 mg | INTRAMUSCULAR | Status: DC | PRN
  Administered 2011-05-10 (×3): 2 mg via INTRAVENOUS
  Filled 2011-05-09 (×3): qty 1

## 2011-05-09 MED ORDER — NITROGLYCERIN IN D5W 200-5 MCG/ML-% IV SOLN: 2.0000 ug/min | INTRAVENOUS | Status: DC

## 2011-05-09 MED ORDER — IOHEXOL 350 MG/ML SOLN
100.0000 mL | Freq: Once | INTRAVENOUS | Status: AC | PRN
  Administered 2011-05-09: 100 mL via INTRAVENOUS

## 2011-05-09 MED ORDER — IPRATROPIUM BROMIDE 0.02 % IN SOLN
0.5000 mg | Freq: Four times a day (QID) | RESPIRATORY_TRACT | Status: DC
  Administered 2011-05-10: 0.5 mg via RESPIRATORY_TRACT
  Filled 2011-05-09: qty 2.5

## 2011-05-09 MED ORDER — DOCUSATE SODIUM 100 MG PO CAPS
100.0000 mg | ORAL_CAPSULE | Freq: Two times a day (BID) | ORAL | Status: DC
  Administered 2011-05-10 – 2011-05-11 (×3): 100 mg via ORAL
  Filled 2011-05-09 (×5): qty 1

## 2011-05-09 MED ORDER — PANTOPRAZOLE SODIUM 20 MG PO TBEC
20.0000 mg | DELAYED_RELEASE_TABLET | Freq: Every day | ORAL | Status: DC
  Filled 2011-05-09: qty 1

## 2011-05-09 MED ORDER — SIMVASTATIN 5 MG PO TABS
5.0000 mg | ORAL_TABLET | Freq: Every day | ORAL | Status: DC
  Administered 2011-05-10 – 2011-05-11 (×2): 5 mg via ORAL
  Filled 2011-05-09 (×3): qty 1

## 2011-05-09 MED ORDER — METFORMIN HCL 850 MG PO TABS
850.0000 mg | ORAL_TABLET | Freq: Two times a day (BID) | ORAL | Status: DC
  Filled 2011-05-09 (×3): qty 1

## 2011-05-09 MED ORDER — ONDANSETRON HCL 4 MG PO TABS: 4.0000 mg | ORAL_TABLET | Freq: Four times a day (QID) | ORAL | Status: DC | PRN

## 2011-05-09 MED ORDER — INSULIN GLARGINE 100 UNIT/ML ~~LOC~~ SOLN
60.0000 [IU] | Freq: Every day | SUBCUTANEOUS | Status: DC
  Administered 2011-05-10: 30 [IU] via SUBCUTANEOUS
  Administered 2011-05-11: 60 [IU] via SUBCUTANEOUS

## 2011-05-09 MED ORDER — SODIUM CHLORIDE 0.45 % IV SOLN
INTRAVENOUS | Status: DC
  Administered 2011-05-10: 15:00:00 via INTRAVENOUS

## 2011-05-09 MED ORDER — AMITRIPTYLINE HCL 75 MG PO TABS
75.0000 mg | ORAL_TABLET | Freq: Every day | ORAL | Status: DC
  Administered 2011-05-10 – 2011-05-11 (×2): 75 mg via ORAL
  Filled 2011-05-09 (×3): qty 1

## 2011-05-09 MED ORDER — LORAZEPAM 1 MG PO TABS
1.0000 mg | ORAL_TABLET | Freq: Three times a day (TID) | ORAL | Status: DC
  Administered 2011-05-10 – 2011-05-11 (×4): 1 mg via ORAL
  Filled 2011-05-09 (×4): qty 1

## 2011-05-09 MED ORDER — ASPIRIN EC 81 MG PO TBEC
81.0000 mg | DELAYED_RELEASE_TABLET | Freq: Every day | ORAL | Status: DC
  Administered 2011-05-10 – 2011-05-12 (×2): 81 mg via ORAL
  Filled 2011-05-09 (×3): qty 1

## 2011-05-09 MED ORDER — ONDANSETRON HCL 4 MG/2ML IJ SOLN
4.0000 mg | Freq: Four times a day (QID) | INTRAMUSCULAR | Status: DC | PRN
Start: 2011-05-09 — End: 2011-05-11
  Administered 2011-05-10: 4 mg via INTRAVENOUS
  Filled 2011-05-09: qty 2

## 2011-05-09 MED ORDER — HYDROCODONE-ACETAMINOPHEN 10-325 MG PO TABS
1.0000 | ORAL_TABLET | Freq: Four times a day (QID) | ORAL | Status: DC | PRN
  Administered 2011-05-10 – 2011-05-11 (×2): 1 via ORAL
  Filled 2011-05-09 (×3): qty 1

## 2011-05-09 MED ORDER — ALBUTEROL SULFATE (5 MG/ML) 0.5% IN NEBU
2.5000 mg | INHALATION_SOLUTION | Freq: Four times a day (QID) | RESPIRATORY_TRACT | Status: DC
  Administered 2011-05-10: 2.5 mg via RESPIRATORY_TRACT
  Filled 2011-05-09: qty 0.5

## 2011-05-09 MED ORDER — CLOPIDOGREL BISULFATE 75 MG PO TABS
75.0000 mg | ORAL_TABLET | Freq: Every day | ORAL | Status: DC
Start: 2011-05-10 — End: 2011-05-12
  Administered 2011-05-10 – 2011-05-12 (×2): 75 mg via ORAL
  Filled 2011-05-09 (×3): qty 1

## 2011-05-09 MED ORDER — HEPARIN BOLUS VIA INFUSION: 4000.0000 [IU] | Freq: Once | INTRAVENOUS | Status: DC

## 2011-05-09 MED ORDER — NITROGLYCERIN IN D5W 200-5 MCG/ML-% IV SOLN
5.0000 ug/min | INTRAVENOUS | Status: DC
  Administered 2011-05-09 – 2011-05-10 (×2): 5 ug/min via INTRAVENOUS
  Filled 2011-05-09: qty 250

## 2011-05-09 NOTE — ED Notes (Signed)
Total of three nitro and 324 of Asprin prior to arrival

## 2011-05-09 NOTE — ED Notes (Signed)
Per ems- pt has had chest pain since 1030 today. Pt took 2 nitro prior to ems arrival. central chest pain that radiates to rt ear.

## 2011-05-09 NOTE — ED Notes (Signed)
Pt given water 

## 2011-05-09 NOTE — ED Provider Notes (Cosign Needed Addendum)
History     CSN: [id number]  Arrival date & time 05/09/11  1617   First MD Initiated Contact with Patient 05/09/11 1624      Chief Complaint  Patient presents with  . Chest Pain    (Consider location/radiation/quality/duration/timing/severity/associated sxs/prior treatment) Patient is a 66 y.o. female presenting with chest pain. The history is provided by the patient. No language interpreter was used.  Chest Pain The chest pain began 3 - 5 hours ago. Duration of episode(s) is 10 minutes. Chest pain occurs intermittently. The chest pain is worsening. Associated with: Nothing. At its most intense, the pain is at 8/10. The pain is currently at 8/10. The severity of the pain is severe. The quality of the pain is described as pressure-like. The pain radiates to the right shoulder. Exacerbated by: Nothing. Primary symptoms include dizziness.  Dizziness also occurs with weakness. Dizziness does not occur with diaphoresis.  Associated symptoms include near-syncope and weakness.  Pertinent negatives for associated symptoms include no diaphoresis. She tried nitroglycerin and NSAIDs for the symptoms. Risk factors include lack of exercise, post-menopausal, smoking/tobacco exposure and sedentary lifestyle.  Her past medical history is significant for CAD, diabetes, hypertension and MI.     Past Medical History  Diagnosis Date  . Hyperlipidemia   . Chronic kidney disease   . Arthritis   . Depression   . Leg pain   . GERD (gastroesophageal reflux disease)   . Joint pain   . Dizziness   . Anxiety   . Myocardial infarction age 75  . Diabetes mellitus age 61    insulin dependent  . Pulmonary vascular disease     Past Surgical History  Procedure Date  . Pr vein bypass graft,aorto-fem-pop 05/27/2010  . Appendectomy   . Abdominal hysterectomy   . Knee surgery   . Cholecystectomy   . Lung surgery     lung nodule removed from the right side  . Nephrectomy     left partial    Family  History  Problem Relation Age of Onset  . Heart disease Mother   . Heart disease Father   . Heart disease Sister     History  Substance Use Topics  . Smoking status: Current Everyday Smoker -- 0.5 packs/day for 50 years    Types: Cigarettes  . Smokeless tobacco: Not on file  . Alcohol Use: No    OB History    Grav Para Term Preterm Abortions TAB SAB Ect Mult Living                  Review of Systems  Constitutional: Negative for diaphoresis.  Cardiovascular: Positive for chest pain and near-syncope.  Neurological: Positive for dizziness and weakness.    Allergies  YQM:VHQIONGEXBM+WUXLKGMWN+UUVOZDGUYQ acid+aspartame and Simcor  Home Medications   Current Outpatient Rx  Name Route Sig Dispense Refill  . AMITRIPTYLINE HCL 100 MG PO TABS Oral Take 100 mg by mouth at bedtime.      . CLOPIDOGREL BISULFATE 75 MG PO TABS Oral Take 75 mg by mouth daily.      . CYCLOBENZAPRINE HCL 5 MG PO TABS Oral Take 5 mg by mouth 2 (two) times daily as needed.      Marland Kitchen NEURONTIN PO Oral Take by mouth. New medicine, pt doesn't remember dosage     . INSULIN ASPART 100 UNIT/ML [place] SOLN Subcutaneous Inject 4-6 Units into the skin 3 (three) times daily before meals.      . INSULIN GLARGINE 100 UNIT/ML [place]  SOLN Subcutaneous Inject 60 Units into the skin at bedtime.      Marland Kitchen LANSOPRAZOLE 30 MG PO CPDR Oral Take 30 mg by mouth daily.      Marland Kitchen LEVOTHYROXINE SODIUM 125 MCG PO TABS Oral Take 125 mcg by mouth daily.      Marland Kitchen LORAZEPAM 1 MG PO TABS Oral Take 1 mg by mouth 3 (three) times daily.      Marland Kitchen METFORMIN HCL ER (MOD) 500 MG PO TB24 Oral Take 800 mg by mouth 2 (two) times daily with a meal.     . TOLTERODINE TARTRATE ER 4 MG PO CP24 Oral Take 4 mg by mouth daily.        BP 98/56  Pulse 81  Temp(Src) 99.1 F (37.3 C) (Oral)  Resp 15  SpO2 96%  Physical Exam  ED Course  CRITICAL CARE Performed by: Osvaldo Human Authorized by: Osvaldo Human Total critical care time: 30 minutes Critical  care was necessary to treat or prevent imminent or life-threatening deterioration of the following conditions: Chest pain that was not relieved by IV NTG, IV morphine, Dx unstable angina. Critical care was time spent personally by me on the following activities: development of treatment plan with patient or surrogate, discussions with consultants, evaluation of patient's response to treatment, examination of patient, obtaining history from patient or surrogate, ordering and review of laboratory studies, ordering and performing treatments and interventions, ordering and review of radiographic studies, re-evaluation of patient's condition and review of old charts.   (including critical care time)    Date: 05/09/2011  Rate: 82  Rhythm: normal sinus rhythm  QRS Axis: right  Intervals: normal  ST/T Wave abnormalities: normal  Conduction Disutrbances:left posterior fascicular block  Narrative Interpretation: Abnormal EKG.  Old EKG Reviewed: changes noted--QRS axis has changed from 235 to 115 since tracing of 09/06/2010.  6:43 PM Pt's D-dimer is elevated at 0.9.  Waiting for results of cardiac markers.  6:58 PM Cardiac markers were negative.  CT angio of chest ordered, as discussed with pt.   9:41 PM CT angio of chest negative for PE.  She continues to have chest pain despite IV NTG, IV doses of morphine.  Will ask internal medicine to admit her for chest pain observation.  10:04 PM Discussed with Dr. Mikeal Hawthorne, admit to stepdown unit, Dx unstable angina.   IV heparin ordered.  1. Chest pain   2. Unstable angina            Carleene Cooper III, MD 05/09/11 1610  Carleene Cooper III, MD 05/09/11 2209

## 2011-05-09 NOTE — Progress Notes (Signed)
ANTICOAGULATION CONSULT NOTE - Initial Consult  Pharmacy Consult for UFH Indication: USAP  Allergies  Allergen Reactions  . ZOX:WRUEAVWUJWJ+XBJYNWGNF+AOZHYQMVHQ Acid+Aspartame     REACTION: Rash  . Simcor Hives    Patient Measurements: Height: 5' 1.81" (157 cm) Weight: 156 lb 1.4 oz (70.8 kg) IBW/kg (Calculated) : 49.67   Vital Signs: Temp: 97.6 F (36.4 C) (03/16 2131) Temp src: Oral (03/16 2131) BP: 123/77 mmHg (03/16 2131) Pulse Rate: 78  (03/16 1849)  Labs:  Basename 05/09/11 1743  HGB 12.7  HCT 37.4  PLT 278  APTT 27  LABPROT 14.2  INR 1.08  HEPARINUNFRC --  CREATININE 0.83  CKTOTAL 32  CKMB 1.4  TROPONINI <0.30   Estimated Creatinine Clearance: 63.6 ml/min (by C-G formula based on Cr of 0.83).  Medical History: Past Medical History  Diagnosis Date  . Hyperlipidemia   . Chronic kidney disease   . Arthritis   . Depression   . Leg pain   . GERD (gastroesophageal reflux disease)   . Joint pain   . Dizziness   . Anxiety   . Myocardial infarction age 50  . Diabetes mellitus age 80    insulin dependent  . Pulmonary vascular disease     Medications:   (Not in a hospital admission)  Assessment: 66 y/o female patient admitted with chest pain requiring anticoagulation for r/o MI. CE negx1, CTA neg for PE, d-dimer elevated with EKG wnl.  Goal of Therapy:  Heparin level 0.3-0.7 units/ml   Plan:  Heparin 3500 unit IV bolus followed by infusion at 1000 units/hr. Check 6 hour heparin level with daily cbc and heparin level.  Verlene Mayer, PharmD, BCPS Pager [phone] 05/09/2011,10:16 PM

## 2011-05-09 NOTE — ED Notes (Signed)
Gave old and new ECG to Dr. Ignacia Palma after I performed. 4:31 pm JG.

## 2011-05-10 ENCOUNTER — Encounter (HOSPITAL_COMMUNITY): Payer: Self-pay | Admitting: Cardiology

## 2011-05-10 DIAGNOSIS — I2510 Atherosclerotic heart disease of native coronary artery without angina pectoris: Secondary | ICD-10-CM

## 2011-05-10 DIAGNOSIS — E785 Hyperlipidemia, unspecified: Secondary | ICD-10-CM

## 2011-05-10 LAB — GLUCOSE, CAPILLARY
Glucose-Capillary: 124 mg/dL — ABNORMAL HIGH (ref 70–99)
Glucose-Capillary: 174 mg/dL — ABNORMAL HIGH (ref 70–99)

## 2011-05-10 LAB — HEMOGLOBIN A1C
Hgb A1c MFr Bld: 7.8 % — ABNORMAL HIGH (ref <5.7)
Mean Plasma Glucose: 177 mg/dL — ABNORMAL HIGH (ref <117)

## 2011-05-10 LAB — CBC
HCT: 38.6 % (ref 36.0–46.0)
MCH: 28 pg (ref 26.0–34.0)
MCHC: 33.7 g/dL (ref 30.0–36.0)
MCV: 83 fL (ref 78.0–100.0)
Platelets: 221 10*3/uL (ref 150–400)
RDW: 14.5 % (ref 11.5–15.5)
WBC: 6.9 10*3/uL (ref 4.0–10.5)

## 2011-05-10 LAB — MRSA PCR SCREENING: MRSA by PCR: NEGATIVE

## 2011-05-10 LAB — COMPREHENSIVE METABOLIC PANEL
AST: 10 U/L (ref 0–37)
Albumin: 3.1 g/dL — ABNORMAL LOW (ref 3.5–5.2)
BUN: 8 mg/dL (ref 6–23)
Calcium: 8.6 mg/dL (ref 8.4–10.5)
Chloride: 105 mEq/L (ref 96–112)
Creatinine, Ser: 0.72 mg/dL (ref 0.50–1.10)
Total Bilirubin: 0.1 mg/dL — ABNORMAL LOW (ref 0.3–1.2)
Total Protein: 6.8 g/dL (ref 6.0–8.3)

## 2011-05-10 LAB — URINE CULTURE

## 2011-05-10 LAB — PRO B NATRIURETIC PEPTIDE: Pro B Natriuretic peptide (BNP): 39.2 pg/mL (ref 0–125)

## 2011-05-10 LAB — CARDIAC PANEL(CRET KIN+CKTOT+MB+TROPI)
CK, MB: 1.7 ng/mL (ref 0.3–4.0)
Relative Index: INVALID (ref 0.0–2.5)
Relative Index: INVALID (ref 0.0–2.5)
Relative Index: INVALID (ref 0.0–2.5)
Total CK: 31 U/L (ref 7–177)
Total CK: 33 U/L (ref 7–177)
Troponin I: <0.3 ng/mL (ref <0.30)

## 2011-05-10 LAB — HEPARIN LEVEL (UNFRACTIONATED): Heparin Unfractionated: 0.4 IU/mL (ref 0.30–0.70)

## 2011-05-10 MED ORDER — PANTOPRAZOLE SODIUM 40 MG PO TBEC
40.0000 mg | DELAYED_RELEASE_TABLET | Freq: Two times a day (BID) | ORAL | Status: DC
  Administered 2011-05-10 – 2011-05-11 (×2): 40 mg via ORAL
  Filled 2011-05-10: qty 1

## 2011-05-10 MED ORDER — DIAZEPAM 5 MG PO TABS
10.0000 mg | ORAL_TABLET | ORAL | Status: AC
  Administered 2011-05-11: 10 mg via ORAL
  Filled 2011-05-10: qty 2

## 2011-05-10 MED ORDER — ASPIRIN 81 MG PO CHEW
324.0000 mg | CHEWABLE_TABLET | ORAL | Status: AC
  Administered 2011-05-11: 324 mg via ORAL
  Filled 2011-05-10: qty 4

## 2011-05-10 MED ORDER — INSULIN ASPART 100 UNIT/ML ~~LOC~~ SOLN
0.0000 [IU] | Freq: Three times a day (TID) | SUBCUTANEOUS | Status: DC
  Administered 2011-05-10 (×2): 3 [IU] via SUBCUTANEOUS
  Administered 2011-05-11: 5 [IU] via SUBCUTANEOUS

## 2011-05-10 MED ORDER — MORPHINE SULFATE 4 MG/ML IJ SOLN: 4.0000 mg | INTRAMUSCULAR | Status: DC | PRN

## 2011-05-10 MED ORDER — INSULIN ASPART 100 UNIT/ML ~~LOC~~ SOLN
0.0000 [IU] | Freq: Every day | SUBCUTANEOUS | Status: DC
  Administered 2011-05-10: 5 [IU] via SUBCUTANEOUS

## 2011-05-10 MED ORDER — NAPROXEN 250 MG PO TABS
250.0000 mg | ORAL_TABLET | Freq: Two times a day (BID) | ORAL | Status: DC
  Administered 2011-05-10: 250 mg via ORAL
  Filled 2011-05-10 (×3): qty 1

## 2011-05-10 MED ORDER — SODIUM CHLORIDE 0.9 % IV SOLN
INTRAVENOUS | Status: DC
  Administered 2011-05-11: 1000 mL via INTRAVENOUS

## 2011-05-10 MED ORDER — NITROGLYCERIN IN D5W 200-5 MCG/ML-% IV SOLN
5.0000 ug/min | INTRAVENOUS | Status: DC
  Administered 2011-05-10: 5 ug/min via INTRAVENOUS

## 2011-05-10 MED ORDER — MORPHINE SULFATE 2 MG/ML IJ SOLN
2.0000 mg | INTRAMUSCULAR | Status: DC | PRN
  Administered 2011-05-10 – 2011-05-11 (×2): 4 mg via INTRAVENOUS
  Filled 2011-05-10: qty 1
  Filled 2011-05-10 (×2): qty 2
  Filled 2011-05-10: qty 3

## 2011-05-10 MED ORDER — METOPROLOL TARTRATE 25 MG PO TABS
25.0000 mg | ORAL_TABLET | Freq: Two times a day (BID) | ORAL | Status: DC
Start: 2011-05-10 — End: 2011-05-12
  Administered 2011-05-10 – 2011-05-12 (×4): 25 mg via ORAL
  Filled 2011-05-10 (×6): qty 1

## 2011-05-10 NOTE — H&P (Signed)
April Branch is an 66 y.o. female.   Chief Complaint: Chest Pain HPI: 66 yo woman with known history of CAD with multiple MIs in the past here with persistent chest pain rated as 6/10 radiating to her neck and occasionally her left arm. Has no nausea or diaphoresis. Chest pain has persisted and not relieved by Nitro or morphine. Has not had a Cath in a while. No EKG changes, negative enzymes in the ED.  Past Medical History  Diagnosis Date  . Hyperlipidemia   . Chronic kidney disease   . Arthritis   . Depression   . Leg pain   . GERD (gastroesophageal reflux disease)   . Joint pain   . Dizziness   . Anxiety   . Myocardial infarction age 78  . 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 Pulmonary vascular disease     Past Surgical History  Procedure Date  . Pr vein bypass graft,aorto-fem-pop 05/27/2010  . Appendectomy   . Abdominal hysterectomy   . Knee surgery   . Cholecystectomy   . Lung surgery     lung nodule removed from the right side  . Nephrectomy     left partial    Family History  Problem Relation Age of Onset  . Heart disease Mother   . Heart disease Father   . Heart disease Sister    Social History:  reports that she has been smoking Cigarettes.  She has a 25 pack-year smoking history. She does not have any smokeless tobacco history on file. She reports that she does not drink alcohol. Her drug history not on file.  Allergies:  Allergies  Allergen Reactions  . ZHY:QMVHQIONGEX+BMWUXLKGM+WNUUVOZDGU Acid+Aspartame     REACTION: Rash  . Simcor Hives    Medications Prior to Admission  Medication Dose Route Frequency Provider Last Rate Last Dose  . 0.45 % sodium chloride infusion   Intravenous Continuous Rometta Emery, MD 100 mL/hr at 05/10/11 0000    . 0.9 %  sodium chloride infusion   Intravenous STAT Carleene Cooper III, MD      . albuterol (PROVENTIL) (5 MG/ML) 0.5% nebulizer solution 2.5 mg  2.5 mg Nebulization Q2H PRN Rometta Emery, MD       . amitriptyline (ELAVIL) tablet 75 mg  75 mg Oral QHS Rometta Emery, MD      . aspirin EC tablet 81 mg  81 mg Oral Daily Rometta Emery, MD   81 mg at 05/10/11 0905  . clopidogrel (PLAVIX) tablet 75 mg  75 mg Oral Daily Rometta Emery, MD   75 mg at 05/10/11 0905  . cyclobenzaprine (FLEXERIL) tablet 5 mg  5 mg Oral BID PRN Rometta Emery, MD      . docusate sodium (COLACE) capsule 100 mg  100 mg Oral BID Rometta Emery, MD   100 mg at 05/10/11 0903  . heparin ADULT infusion 100 units/mL (25000 units/250 mL)  1,100 Units/hr Intravenous Continuous Colleen Can, PHARMD 11 mL/hr at 05/10/11 0652 1,100 Units/hr at 05/10/11 4403  . heparin bolus via infusion 3,500 Units  3,500 Units Intravenous Once Carleene Cooper III, MD   3,500 Units at 05/09/11 2215  . HYDROcodone-acetaminophen (NORCO) 10-325 MG per tablet 1 tablet  1 tablet Oral Q6H PRN Rometta Emery, MD   1 tablet at 05/10/11 0902  . insulin aspart (novoLOG) injection 0-15 Units  0-15 Units Subcutaneous TID WC Calvert Cantor, MD      .  insulin aspart (novoLOG) injection 0-5 Units  0-5 Units Subcutaneous QHS Saima Rizwan, MD      . insulin glargine (LANTUS) injection 60 Units  60 Units Subcutaneous QHS Rometta Emery, MD      . iohexol (OMNIPAQUE) 350 MG/ML injection 100 mL  100 mL Intravenous Once PRN Medication Radiologist, MD   100 mL at 05/09/11 2026  . levothyroxine (SYNTHROID, LEVOTHROID) tablet 88 mcg  88 mcg Oral Daily Rometta Emery, MD   88 mcg at 05/10/11 0903  . LORazepam (ATIVAN) tablet 1 mg  1 mg Oral TID Rometta Emery, MD   1 mg at 05/10/11 1026  . metoprolol tartrate (LOPRESSOR) tablet 25 mg  25 mg Oral BID Calvert Cantor, MD   25 mg at 05/10/11 1027  . morphine 2 MG/ML injection 2 mg  2 mg Intravenous Q3H PRN Rometta Emery, MD      . morphine 4 MG/ML injection 4 mg  4 mg Intravenous Once Carleene Cooper III, MD   4 mg at 05/09/11 1755  . morphine 4 MG/ML injection 4 mg  4 mg Intravenous Once Carleene Cooper  III, MD   4 mg at 05/09/11 1851  . morphine 4 MG/ML injection 4 mg  4 mg Intravenous Once Carleene Cooper III, MD   4 mg at 05/09/11 2206  . naproxen (NAPROSYN) tablet 250 mg  250 mg Oral BID WC Calvert Cantor, MD   250 mg at 05/10/11 1026  . nicotine (NICODERM CQ - dosed in mg/24 hours) patch 21 mg  21 mg Transdermal Daily Rometta Emery, MD   21 mg at 05/10/11 0903  . nitroGLYCERIN 0.2 mg/mL in dextrose 5 % infusion  5 mcg/min Intravenous Titrated Carleene Cooper III, MD 1.5 mL/hr at 05/09/11 1755 5 mcg/min at 05/09/11 1755  . nitroGLYCERIN 0.2 mg/mL in dextrose 5 % infusion  2-200 mcg/min Intravenous Titrated Rometta Emery, MD 3 mL/hr at 05/10/11 0822 10 mcg/min at 05/10/11 8295  . ondansetron (ZOFRAN) injection 4 mg  4 mg Intravenous Once Carleene Cooper III, MD   4 mg at 05/09/11 1755  . ondansetron (ZOFRAN) injection 4 mg  4 mg Intravenous Once Carleene Cooper III, MD   4 mg at 05/09/11 2201  . ondansetron (ZOFRAN) tablet 4 mg  4 mg Oral Q6H PRN Rometta Emery, MD       Or  . ondansetron (ZOFRAN) injection 4 mg  4 mg Intravenous Q6H PRN Rometta Emery, MD      . pantoprazole (PROTONIX) EC tablet 20 mg  20 mg Oral Q1200 Rometta Emery, MD      . simvastatin (ZOCOR) tablet 5 mg  5 mg Oral q1800 Rometta Emery, MD      . tolterodine (DETROL LA) 24 hr capsule 4 mg  4 mg Oral Daily Rometta Emery, MD   4 mg at 05/10/11 0905  . DISCONTD: albuterol (PROVENTIL) (5 MG/ML) 0.5% nebulizer solution 2.5 mg  2.5 mg Nebulization Q6H Rometta Emery, MD   2.5 mg at 05/10/11 0141  . DISCONTD: heparin bolus via infusion 4,000 Units  4,000 Units Intravenous Once Carleene Cooper III, MD      . DISCONTD: insulin aspart (novoLOG) injection 4-6 Units  4-6 Units Subcutaneous TID AC Rometta Emery, MD      . DISCONTD: ipratropium (ATROVENT) nebulizer solution 0.5 mg  0.5 mg Nebulization Q6H Rometta Emery, MD   0.5 mg at 05/10/11 0141  . DISCONTD: metFORMIN (GLUCOPHAGE)  tablet 850 mg  850 mg Oral BID WC  Rometta Emery, MD       Medications Prior to Admission  Medication Sig Dispense Refill  . clopidogrel (PLAVIX) 75 MG tablet Take 75 mg by mouth daily.        . cyclobenzaprine (FLEXERIL) 5 MG tablet Take 5 mg by mouth 2 (two) times daily as needed. For muscle spasms      . insulin aspart (NOVOLOG) 100 UNIT/ML injection Inject 4-6 Units into the skin 3 (three) times daily before meals.        . insulin glargine (LANTUS) 100 UNIT/ML injection Inject 60 Units into the skin at bedtime.        . lansoprazole (PREVACID) 30 MG capsule Take 30 mg by mouth daily.        Marland Kitchen LORazepam (ATIVAN) 1 MG tablet Take 1 mg by mouth 3 (three) times daily.       Marland Kitchen tolterodine (DETROL LA) 4 MG 24 hr capsule Take 4 mg by mouth daily.          Results for orders placed during the hospital encounter of 05/09/11 (from the past 48 hour(s))  CBC     Status: Normal   Collection Time   05/09/11  5:43 PM      Component Value Range Comment   WBC 9.5  4.0 - 10.5 (K/uL)    RBC 4.57  3.87 - 5.11 (MIL/uL)    Hemoglobin 12.7  12.0 - 15.0 (g/dL)    HCT 16.1  09.6 - 04.5 (%)    MCV 81.8  78.0 - 100.0 (fL)    MCH 27.8  26.0 - 34.0 (pg)    MCHC 34.0  30.0 - 36.0 (g/dL)    RDW 40.9  81.1 - 91.4 (%)    Platelets 278  150 - 400 (K/uL)   DIFFERENTIAL     Status: Normal   Collection Time   05/09/11  5:43 PM      Component Value Range Comment   Neutrophils Relative 57  43 - 77 (%)    Neutro Abs 5.4  1.7 - 7.7 (K/uL)    Lymphocytes Relative 36  12 - 46 (%)    Lymphs Abs 3.4  0.7 - 4.0 (K/uL)    Monocytes Relative 5  3 - 12 (%)    Monocytes Absolute 0.5  0.1 - 1.0 (K/uL)    Eosinophils Relative 1  0 - 5 (%)    Eosinophils Absolute 0.1  0.0 - 0.7 (K/uL)    Basophils Relative 1  0 - 1 (%)    Basophils Absolute 0.1  0.0 - 0.1 (K/uL)   COMPREHENSIVE METABOLIC PANEL     Status: Abnormal   Collection Time   05/09/11  5:43 PM      Component Value Range Comment   Sodium 136  135 - 145 (mEq/L)    Potassium 4.3  3.5 - 5.1 (mEq/L)     Chloride 100  96 - 112 (mEq/L)    CO2 24  19 - 32 (mEq/L)    Glucose, Bld 208 (*) 70 - 99 (mg/dL)    BUN 12  6 - 23 (mg/dL)    Creatinine, Ser 7.82  0.50 - 1.10 (mg/dL)    Calcium 8.9  8.4 - 10.5 (mg/dL)    Total Protein 6.9  6.0 - 8.3 (g/dL)    Albumin 3.2 (*) 3.5 - 5.2 (g/dL)    AST 12  0 - 37 (U/L)  ALT 10  0 - 35 (U/L)    Alkaline Phosphatase 98  39 - 117 (U/L)    Total Bilirubin 0.1 (*) 0.3 - 1.2 (mg/dL)    GFR calc non Af Amer 73 (*) >90 (mL/min)    GFR calc Af Amer 85 (*) >90 (mL/min)   CARDIAC PANEL(CRET KIN+CKTOT+MB+TROPI)     Status: Normal   Collection Time   05/09/11  5:43 PM      Component Value Range Comment   Total CK 32  7 - 177 (U/L)    CK, MB 1.4  0.3 - 4.0 (ng/mL)    Troponin I <0.30  <0.30 (ng/mL)    Relative Index RELATIVE INDEX IS INVALID  0.0 - 2.5    D-DIMER, QUANTITATIVE     Status: Abnormal   Collection Time   05/09/11  5:43 PM      Component Value Range Comment   D-Dimer, Quant 0.99 (*) 0.00 - 0.48 (ug/mL-FEU)   PROTIME-INR     Status: Normal   Collection Time   05/09/11  5:43 PM      Component Value Range Comment   Prothrombin Time 14.2  11.6 - 15.2 (seconds)    INR 1.08  0.00 - 1.49    APTT     Status: Normal   Collection Time   05/09/11  5:43 PM      Component Value Range Comment   aPTT 27  24 - 37 (seconds)   URINALYSIS, ROUTINE W REFLEX MICROSCOPIC     Status: Abnormal   Collection Time   05/09/11  6:35 PM      Component Value Range Comment   Color, Urine YELLOW  YELLOW     APPearance CLEAR  CLEAR     Specific Gravity, Urine 1.009  1.005 - 1.030     pH 5.5  5.0 - 8.0     Glucose, UA NEGATIVE  NEGATIVE (mg/dL)    Hgb urine dipstick SMALL (*) NEGATIVE     Bilirubin Urine NEGATIVE  NEGATIVE     Ketones, ur NEGATIVE  NEGATIVE (mg/dL)    Protein, ur NEGATIVE  NEGATIVE (mg/dL)    Urobilinogen, UA 0.2  0.0 - 1.0 (mg/dL)    Nitrite NEGATIVE  NEGATIVE     Leukocytes, UA NEGATIVE  NEGATIVE    URINE MICROSCOPIC-ADD ON     Status: Normal    Collection Time   05/09/11  6:35 PM      Component Value Range Comment   Squamous Epithelial / LPF RARE  RARE     WBC, UA 0-2  <3 (WBC/hpf)    RBC / HPF 0-2  <3 (RBC/hpf)   MRSA PCR SCREENING     Status: Normal   Collection Time   05/09/11 11:15 PM      Component Value Range Comment   MRSA by PCR NEGATIVE  NEGATIVE    TSH     Status: Abnormal   Collection Time   05/09/11 11:33 PM      Component Value Range Comment   TSH 11.003 (*) 0.350 - 4.500 (uIU/mL)   HEMOGLOBIN A1C     Status: Abnormal   Collection Time   05/09/11 11:33 PM      Component Value Range Comment   Hemoglobin A1C 7.8 (*) <5.7 (%)    Mean Plasma Glucose 177 (*) <117 (mg/dL)   CARDIAC PANEL(CRET KIN+CKTOT+MB+TROPI)     Status: Normal   Collection Time   05/09/11 11:38 PM      Component  Value Range Comment   Total CK 31  7 - 177 (U/L)    CK, MB 1.4  0.3 - 4.0 (ng/mL)    Troponin I <0.30  <0.30 (ng/mL)    Relative Index RELATIVE INDEX IS INVALID  0.0 - 2.5    PRO B NATRIURETIC PEPTIDE     Status: Normal   Collection Time   05/09/11 11:38 PM      Component Value Range Comment   Pro B Natriuretic peptide (BNP) 39.2  0 - 125 (pg/mL)   HEPARIN LEVEL (UNFRACTIONATED)     Status: Normal   Collection Time   05/10/11  5:00 AM      Component Value Range Comment   Heparin Unfractionated 0.30  0.30 - 0.70 (IU/mL)   CBC     Status: Normal   Collection Time   05/10/11  5:00 AM      Component Value Range Comment   WBC 6.9  4.0 - 10.5 (K/uL)    RBC 4.65  3.87 - 5.11 (MIL/uL)    Hemoglobin 13.0  12.0 - 15.0 (g/dL)    HCT 16.1  09.6 - 04.5 (%)    MCV 83.0  78.0 - 100.0 (fL)    MCH 28.0  26.0 - 34.0 (pg)    MCHC 33.7  30.0 - 36.0 (g/dL)    RDW 40.9  81.1 - 91.4 (%)    Platelets 221  150 - 400 (K/uL)   COMPREHENSIVE METABOLIC PANEL     Status: Abnormal   Collection Time   05/10/11  5:00 AM      Component Value Range Comment   Sodium 138  135 - 145 (mEq/L)    Potassium 4.8  3.5 - 5.1 (mEq/L)    Chloride 105  96 - 112 (mEq/L)     CO2 26  19 - 32 (mEq/L)    Glucose, Bld 152 (*) 70 - 99 (mg/dL)    BUN 8  6 - 23 (mg/dL)    Creatinine, Ser 7.82  0.50 - 1.10 (mg/dL)    Calcium 8.6  8.4 - 10.5 (mg/dL)    Total Protein 6.8  6.0 - 8.3 (g/dL)    Albumin 3.1 (*) 3.5 - 5.2 (g/dL)    AST 10  0 - 37 (U/L)    ALT 8  0 - 35 (U/L)    Alkaline Phosphatase 97  39 - 117 (U/L)    Total Bilirubin 0.1 (*) 0.3 - 1.2 (mg/dL)    GFR calc non Af Amer 89 (*) >90 (mL/min)    GFR calc Af Amer >90  >90 (mL/min)   GLUCOSE, CAPILLARY     Status: Abnormal   Collection Time   05/10/11  7:45 AM      Component Value Range Comment   Glucose-Capillary 124 (*) 70 - 99 (mg/dL)    Comment 1 Documented in Chart      Comment 2 Notify RN     CARDIAC PANEL(CRET KIN+CKTOT+MB+TROPI)     Status: Normal   Collection Time   05/10/11  8:50 AM      Component Value Range Comment   Total CK 31  7 - 177 (U/L)    CK, MB 1.7  0.3 - 4.0 (ng/mL)    Troponin I <0.30  <0.30 (ng/mL)    Relative Index RELATIVE INDEX IS INVALID  0.0 - 2.5    GLUCOSE, CAPILLARY     Status: Abnormal   Collection Time   05/10/11 11:28 AM  Component Value Range Comment   Glucose-Capillary 196 (*) 70 - 99 (mg/dL)    Comment 1 Documented in Chart      Comment 2 Notify RN      Ct Angio Chest W/cm &/or Wo Cm  05/09/2011  *RADIOLOGY REPORT*  Clinical Data: Chest pain.  Elevated D-dimer.  CT ANGIOGRAPHY CHEST  Technique:  Multidetector CT imaging of the chest using the standard protocol during bolus administration of intravenous contrast. Multiplanar reconstructed images including MIPs were obtained and reviewed to evaluate the vascular anatomy.  Contrast: OMNIPAQUE IOHEXOL 350 MG/ML IV SOLN  Comparison: Chest x-ray 05/09/2011.  Findings: Pulmonary arterial opacification is excellent.  No focal filling defect is evident to suggest pulmonary embolus.  The heart size is normal.  Atherosclerotic calcifications are noted within the coronary arteries.  Atherosclerotic calcifications are  present in the aorta branch vessels as well.  There is focal stenosis at the origin of the vertebral arteries bilaterally.  Limited imaging of the upper abdomen is unremarkable.  Patchy ground-glass attenuation is present throughout both lungs. Focal bronchiectasis is present in the medial right upper lobe. There is some associated airspace disease.  The bone windows demonstrate multilevel endplate degenerative change.  No focal lytic or blastic lesions are evident.  IMPRESSION:  1.  No evidence of pulmonary embolus. 2.  Diffuse ground-glass attenuation throughout the lungs.  This likely represents edema.  Infection is not excluded. 3.  More focal ill-defined airspace disease within the medial right upper lobe is concerning for infection associated with focal bronchiectasis. 4.  Extensive atherosclerotic changes including coronary artery disease.  Original Report Authenticated By: Jamesetta Orleans. MATTERN, M.D.   Dg Chest Port 1 View  05/09/2011  *RADIOLOGY REPORT*  Clinical Data: Chest pain.  PORTABLE CHEST - 1 VIEW  Comparison: Two-view chest 09/07/2010.  Findings: The heart is upper limits of normal for size.  Mild interstitial coarsening is chronic.  No focal airspace disease is evident.  There is no edema or effusion to suggest failure.  The visualized soft tissues and bony thorax are unremarkable.  IMPRESSION:  1.  No acute cardiopulmonary disease. 2.  Stable chronic interstitial coarsening. 3.  Borderline cardiomegaly without failure.  Original Report Authenticated By: Jamesetta Orleans. MATTERN, M.D.    Review of Systems  Respiratory: Positive for shortness of breath.   Cardiovascular: Positive for chest pain and palpitations.  Gastrointestinal: Positive for nausea.  Neurological: Positive for weakness.  All other systems reviewed and are negative.    Blood pressure 124/59, pulse 75, temperature 97.6 F (36.4 C), temperature source Oral, resp. rate 19, height 5\' 6"  (1.676 m), weight 75.2 kg (165 lb  12.6 oz), SpO2 100.00%. Physical Exam  Constitutional: She is oriented to person, place, and time. She appears well-developed and well-nourished.  HENT:  Head: Normocephalic and atraumatic.  Right Ear: External ear normal.  Left Ear: External ear normal.  Nose: Nose normal.  Mouth/Throat: Oropharynx is clear and moist.  Eyes: Conjunctivae and EOM are normal. Pupils are equal, round, and reactive to light.  Neck: Normal range of motion. Neck supple.  Cardiovascular: Normal rate, regular rhythm, normal heart sounds and intact distal pulses.   Respiratory: Effort normal and breath sounds normal.  GI: Soft. Bowel sounds are normal.  Musculoskeletal: Normal range of motion.  Neurological: She is alert and oriented to person, place, and time. She has normal reflexes.  Skin: Skin is warm and dry.  Psychiatric: She has a normal mood and affect. Her behavior is  normal. Judgment and thought content normal.     Assessment/Plan 1. Unstable Angina: Patient is having persistent chest pain. This is likely unstable angina. Will admit to stepdown on Nitroglycerin gtt, IV Morphine, Oxygen and Consider Cardiology consult if enzymes positive or if symptoms did not resolve. 2. HTN: Controlled 3. COPD: Stable, NAD 4. GERD: PPI  Eleazar Kimmey,LAWAL 05/10/2011, 12:36 PM

## 2011-05-10 NOTE — Progress Notes (Signed)
ANTICOAGULATION CONSULT NOTE - Follow Up Consult  Pharmacy Consult for  Heparin Indication: chest pain/ACS  Allergies  Allergen Reactions  . AVW:[id number]+NFAOZHYQM+VHQIONGEXB Acid+Aspartame     REACTION: Rash  . Simcor Hives  . Amoxicillin-Pot Clavulanate Rash    Patient Measurements: Height: 5\' 6"  (167.6 cm) Weight: 165 lb 12.6 oz (75.2 kg) IBW/kg (Calculated) : 59.3  Heparin Dosing Weight: 75 kg  Vital Signs: Temp: 98.2 F (36.8 C) (03/17 1619) Temp src: Oral (03/17 1619) BP: 122/65 mmHg (03/17 1619) Pulse Rate: 64  (03/17 1619)  Labs:  Basename 05/10/11 1457 05/10/11 0850 05/10/11 0500 05/09/11 2338 05/09/11 1743  HGB -- -- 13.0 -- 12.7  HCT -- -- 38.6 -- 37.4  PLT -- -- 221 -- 278  APTT -- -- -- -- 27  LABPROT -- -- -- -- 14.2  INR -- -- -- -- 1.08  HEPARINUNFRC 0.40 -- 0.30 -- --  CREATININE -- -- 0.72 -- 0.83  CKTOTAL 33 31 -- 31 --  CKMB 1.7 1.7 -- 1.4 --  TROPONINI <0.30 <0.30 -- <0.30 --   Estimated Creatinine Clearance: 74.7 ml/min (by C-G formula based on Cr of 0.72).  Assessment:   Heparin level is therapeutic on 1100 units/hr.   CBC stable.  Enzymes negative.    Cardiac cath planned for 3/18.  Goal of Therapy:  Heparin level 0.3-0.7 units/ml   Plan:     Continue heparin drip at 1100 units/hr.    Next heparin level and CBC in AM.   Dennie Fetters, RPh Pager: [phone] 05/10/2011,4:30 PM

## 2011-05-10 NOTE — Progress Notes (Signed)
05/10/11 2030  Pt states she wants to leave the hospital against medical advise.  Discussed with pt risks of leaving without completing treatment.  Pt states she is aware and still wants to leave.  Pt oriented and competent.  Pt states that she needs to leave to get her 64 year old son milk.  When questioned, pt states she does not have a ride home and plans to walk home.  Pt.  States she lives off of [address].  Informed pt that was too far to walk.    Benedetto Coons notified, pt may leave AMA only with a ride home.  Discussed this with pt.  Pt unable to find ride home and does not have money for a cab.  Pt agreeable to stay inhouse and will have catheterization in the morning.  Consent signed and prep done.

## 2011-05-10 NOTE — Progress Notes (Signed)
[name]- Progress note  Interim history: 65 y/o female admitted for chest pain lat night. H and P not currently available.   Subjective: Patient states that the pain started yesterday about 10:30 AM and was initially intermittent and later became constant. It is present in the center of her chest and at times radiates to her right neck and ear and below her right breast. Nitro s/l x 2 helped a little. Now on Nitro infusion at 15 mcg but still having the pain. She had an episode of vomiting yesterday when pain was severe.   Objective: Blood pressure 134/66, pulse 73, temperature 98.4 F (36.9 C), temperature source Oral, resp. rate 16, height 5\' 6"  (1.676 m), weight 75.2 kg (165 lb 12.6 oz), SpO2 99.00%. Weight change:   Intake/Output Summary (Last 24 hours) at 05/10/11 6295 Last data filed at 05/10/11 0400  Gross per 24 hour  Intake    500 ml  Output      0 ml  Net    500 ml    Physical Exam: General appearance: alert, cooperative and mild distress Throat: lips, mucosa, and tongue normal; teeth and gums normal Neck: no adenopathy, no carotid bruit, no JVD, supple, symmetrical, trachea midline and thyroid not enlarged, symmetric, no tenderness/mass/nodules Lungs: clear to auscultation bilaterally Chest: tenderness to palpation of the left costochondral joint of 5th rib.  Heart: regular rate and rhythm, S1, S2 normal, no murmur, click, rub or gallop Abdomen: soft, non-tender; bowel sounds normal; no masses,  no organomegaly EXT: No cyanosis, clubbing or edema.  Skin: She has a shallow ulcer on her toe which has dried blood. No surrounding erythema.   Lab Results:  Basename 05/10/11 0500 05/09/11 1743  NA 138 136  K 4.8 4.3  CL 105 100  CO2 26 24  GLUCOSE 152* 208*  BUN 8 12  CREATININE 0.72 0.83  CALCIUM 8.6 8.9  MG -- --  PHOS -- --    Basename 05/10/11 0500 05/09/11 1743  AST 10 12  ALT 8 10  ALKPHOS 97 98  BILITOT 0.1* 0.1*  PROT 6.8 6.9  ALBUMIN 3.1*  3.2*   No results found for this basename: LIPASE:2,AMYLASE:2 in the last 72 hours  Basename 05/10/11 0500 05/09/11 1743  WBC 6.9 9.5  NEUTROABS -- 5.4  HGB 13.0 12.7  HCT 38.6 37.4  MCV 83.0 81.8  PLT 221 278    Basename 05/09/11 2338 05/09/11 1743  CKTOTAL 31 32  CKMB 1.4 1.4  CKMBINDEX -- --  TROPONINI <0.30 <0.30   No components found with this basename: POCBNP:3  Basename 05/09/11 1743  DDIMER 0.99*   No results found for this basename: HGBA1C:2 in the last 72 hours No results found for this basename: CHOL:2,HDL:2,LDLCALC:2,TRIG:2,CHOLHDL:2,LDLDIRECT:2 in the last 72 hours No results found for this basename: TSH,T4TOTAL,FREET3,T3FREE,THYROIDAB in the last 72 hours No results found for this basename: VITAMINB12:2,FOLATE:2,FERRITIN:2,TIBC:2,IRON:2,RETICCTPCT:2 in the last 72 hours  Micro Results: Recent Results (from the past 240 hour(s))  MRSA PCR SCREENING     Status: Normal   Collection Time   05/09/11 11:15 PM      Component Value Range Status Comment   MRSA by PCR NEGATIVE  NEGATIVE  Final     Studies/Results: Ct Angio Chest W/cm &/or Wo Cm  05/09/2011  *RADIOLOGY REPORT*  Clinical Data: Chest pain.  Elevated D-dimer.  CT ANGIOGRAPHY CHEST  Technique:  Multidetector CT imaging of the chest using the standard protocol during bolus administration of intravenous contrast. Multiplanar reconstructed images  including MIPs were obtained and reviewed to evaluate the vascular anatomy.  Contrast: OMNIPAQUE IOHEXOL 350 MG/ML IV SOLN  Comparison: Chest x-ray 05/09/2011.  Findings: Pulmonary arterial opacification is excellent.  No focal filling defect is evident to suggest pulmonary embolus.  The heart size is normal.  Atherosclerotic calcifications are noted within the coronary arteries.  Atherosclerotic calcifications are present in the aorta branch vessels as well.  There is focal stenosis at the origin of the vertebral arteries bilaterally.  Limited imaging of the upper  abdomen is unremarkable.  Patchy ground-glass attenuation is present throughout both lungs. Focal bronchiectasis is present in the medial right upper lobe. There is some associated airspace disease.  The bone windows demonstrate multilevel endplate degenerative change.  No focal lytic or blastic lesions are evident.  IMPRESSION:  1.  No evidence of pulmonary embolus. 2.  Diffuse ground-glass attenuation throughout the lungs.  This likely represents edema.  Infection is not excluded. 3.  More focal ill-defined airspace disease within the medial right upper lobe is concerning for infection associated with focal bronchiectasis. 4.  Extensive atherosclerotic changes including coronary artery disease.  Original Report Authenticated By: Jamesetta Orleans. MATTERN, M.D.   Dg Chest Port 1 View  05/09/2011  *RADIOLOGY REPORT*  Clinical Data: Chest pain.  PORTABLE CHEST - 1 VIEW  Comparison: Two-view chest 09/07/2010.  Findings: The heart is upper limits of normal for size.  Mild interstitial coarsening is chronic.  No focal airspace disease is evident.  There is no edema or effusion to suggest failure.  The visualized soft tissues and bony thorax are unremarkable.  IMPRESSION:  1.  No acute cardiopulmonary disease. 2.  Stable chronic interstitial coarsening. 3.  Borderline cardiomegaly without failure.  Original Report Authenticated By: Jamesetta Orleans. MATTERN, M.D.    Medications: Scheduled Meds:   . sodium chloride   Intravenous STAT  . amitriptyline  75 mg Oral QHS  . aspirin EC  81 mg Oral Daily  . clopidogrel  75 mg Oral Daily  . docusate sodium  100 mg Oral BID  . heparin  3,500 Units Intravenous Once  . insulin aspart  4-6 Units Subcutaneous TID AC  . insulin glargine  60 Units Subcutaneous QHS  . levothyroxine  88 mcg Oral Daily  . LORazepam  1 mg Oral TID  . metFORMIN  850 mg Oral BID WC  .  morphine injection  4 mg Intravenous Once  .  morphine injection  4 mg Intravenous Once  .  morphine  injection  4 mg Intravenous Once  . nicotine  21 mg Transdermal Daily  . ondansetron  4 mg Intravenous Once  . ondansetron (ZOFRAN) IV  4 mg Intravenous Once  . pantoprazole  20 mg Oral Q1200  . simvastatin  5 mg Oral q1800  . tolterodine  4 mg Oral Daily  . DISCONTD: albuterol  2.5 mg Nebulization Q6H  . DISCONTD: heparin  4,000 Units Intravenous Once  . DISCONTD: ipratropium  0.5 mg Nebulization Q6H   Continuous Infusions:   . sodium chloride 100 mL/hr at 05/10/11 0000  . heparin 1,100 Units/hr (05/10/11 1610)  . nitroGLYCERIN 5 mcg/min (05/09/11 1755)  . nitroGLYCERIN 5 mcg/min (05/10/11 0742)   PRN Meds:.albuterol, cyclobenzaprine, HYDROcodone-acetaminophen, iohexol, morphine, ondansetron (ZOFRAN) IV, ondansetron  Assessment/Plan:   *Unstable angina/ chest pain Has a component of costochondral inflammation but since she had vascular disease, DM and the pain improved initially with Nitro, I have asked for a cards eval. Spoke with Dr Donnie Aho  who will see her.  Cardiac enzymes negative thus far. Ct chest negative.  Start Naprosyn for suspected costochondritis.   HYPERTENSION Cont Nitro. Add Lopressor PO.  DM-  cont Lantus and Novolog.  Hold Glucophage in case of cath.   PVD S.p fem-pop bypass. Cont Plavix.   Hyperlipidemia On Pravastatin at home  Hypothyroid On Synthroid   COPD Stable currently   GERD On prevacid as outpt   LOS: 1 day   [facility] [phone] 05/10/2011, 8:21 AM

## 2011-05-10 NOTE — Consult Note (Signed)
Admit date: 05/09/2011 Name: April Branch 66 y.o.  female DOB:  Jan 13, 1946 MRN:  [id number]  Today's date:  05/10/2011  Referring Physician:    Dr. Butler Denmark  Reason for Consultation:  Chest discomfort  IMPRESSIONS: 1. Ongoing chest discomfort which is continuous with negative cardiac enzymes that has been unrelieved with nitroglycerin. The pain has some atypical features and she does have coronary calcifications noted on coronary CT scan. It is hard to tell whether this is ischemic pain or not. 2. Peripheral vascular disease with previous left popliteal tibial graft which has failed with chronic claudication 3. Insulin-dependent diabetes mellitus 4. Hypertension 5. Hyperlipidemia 6. Previous history of renal cell cancer treated with cryoablation 7. Obesity 8. Anxiety and depression 9. COPD with ongoing tobacco abuse 10. Significant lumbar disc disease 11. Hypothyroidism suboptimally treated  RECOMMENDATION:  1. I would treat her pain with morphine overnight. In the absence of EKG changes or enzyme changes plan cardiac catheterization tomorrow. Cardiac catheterization was discussed with the patient fully including risks of myocardial infarction, death, stroke, bleeding, arrhythmia, dye allergy, renal insufficiency or bleeding.  The patient understands and is willing to proceed.  2. Counseled to discontinue smoking 3. Continue nitroglycerin, Plavix  and heparin for the time being. 4. Add beta blocker  HISTORY: This 66 year old female has a prior history of mild coronary artery disease. She had catheterization done previously by Dr. Fraser Din in 2003 with a 30% circumflex stenosis but not much disease otherwise. She has since been seen by Dr. Eden Emms and has had a dobutamine Cardiolite study done previously that was nonischemic in 2010. She has had significant claudication and recently had a left femoral-popliteal bypass graft that has since occluded. She is continued to smoke  cigarettes.  She presented to the emergency room yesterday with substernal chest discomfort described as a heaviness with some radiation up to her left ear and associated with some pulsating feeling. An EKG showed minor nonspecific ST changes and her cardiac enzymes have been negative. She is continued to have chest discomfort that is unrelieved despite the presence of nitroglycerin intravenously and she is currently being given morphine. A chest CT scan showed significant coronary calcifications and other vascular calcification. She is somewhat inactive but walks her dog at home and is mainly limited by claudication. She has significant issues with anxiety and depression. She has had a significant GI history in the past. She denies PND, orthopnea, palpitations, or syncope.   At the present time although she continues to complain of chest discomfort she is perfectly quiet and awake and does not appear to be in any acute distress.   Past Medical History  Diagnosis Date  . Hyperlipidemia   . Arthritis   . Depression   . Hypothyroidism   . GERD (gastroesophageal reflux disease)   . Anxiety   . Diabetes mellitus age 71    insulin dependent  . Peripheral vascular disease   . Lumbar disc disease   . Personal history of malignant neoplasm of kidney 12/14/2008    Laparoscopic biopsy and cryoablation 7/08 Dr. Normajean Baxter   . Insulin dependent type 2 diabetes mellitus, controlled   . COPD 12/14/2008  . GERD 07/20/2006      Past Surgical History  Procedure Date  . Pr vein bypass graft,aorto-fem-pop 05/27/2010  . Appendectomy   . Abdominal hysterectomy   . Knee surgery   . Cholecystectomy   . Lung surgery     lung nodule removed from the right side  . Laparascopic cryoablation  of left kidney 08/2006    Dr. Sherre Poot for renal cell cancer  . Bladder suspension   . Shoulder surgery   . Foot surgery     Allergies:  is allergic to NWG:NFAOZHYQMVH+QIONGEXBM+[id number] acid+aspartame and simcor.    Medications: Prior to Admission medications   Medication Sig Start Date End Date Taking? Authorizing Provider  amitriptyline (ELAVIL) 75 MG tablet Take 75 mg by mouth at bedtime.   Yes Historical Provider, MD  clopidogrel (PLAVIX) 75 MG tablet Take 75 mg by mouth daily.     Yes Historical Provider, MD  cyclobenzaprine (FLEXERIL) 5 MG tablet Take 5 mg by mouth 2 (two) times daily as needed. For muscle spasms   Yes Historical Provider, MD  HYDROcodone-acetaminophen (NORCO) 10-325 MG per tablet Take 1 tablet by mouth every 6 (six) hours as needed. For pain   Yes Historical Provider, MD  insulin aspart (NOVOLOG) 100 UNIT/ML injection Inject 4-6 Units into the skin 3 (three) times daily before meals.     Yes Historical Provider, MD  insulin glargine (LANTUS) 100 UNIT/ML injection Inject 60 Units into the skin at bedtime.     Yes Historical Provider, MD  lansoprazole (PREVACID) 30 MG capsule Take 30 mg by mouth daily.     Yes Historical Provider, MD  levothyroxine (SYNTHROID, LEVOTHROID) 88 MCG tablet Take 88 mcg by mouth daily.   Yes Historical Provider, MD  LORazepam (ATIVAN) 1 MG tablet Take 1 mg by mouth 3 (three) times daily.    Yes Historical Provider, MD  metFORMIN (GLUCOPHAGE) 850 MG tablet Take 850 mg by mouth 2 (two) times daily with a meal.   Yes Historical Provider, MD  pravastatin (PRAVACHOL) 20 MG tablet Take 20 mg by mouth daily.   Yes Historical Provider, MD  tolterodine (DETROL LA) 4 MG 24 hr capsule Take 4 mg by mouth daily.     Yes Historical Provider, MD    Family History:  Family Status  Relation Status Death Age  . Father Deceased 51's    lung cancer  . Mother Deceased 57    lung cancer    Social History:   reports that she has been smoking Cigarettes.  She has a 25 pack-year smoking history. She does not have any smokeless tobacco history on file. She reports that she does not drink alcohol.   History   Social History Narrative   Widow.  Two sons. Husband died of  lung cancer    Review of Systems: .General ROS: Significant malaise and fatigue, significant anxiety and depression. Psychological ROS: Significant depression following the death of her husband several years ago Respiratory ROS: Chest cough is also chronic dyspnea and has smoked for years Gastrointestinal ROS: She has significant esophageal reflux and has also had abdominal pain and has had a mesenteric arteriogram in the past. Musculoskeletal ROS: Significant chronic low back pain with lumbar disc disease Neurological ROS: no TIA or stroke symptoms Other than as noted above the remainder of the review of systmes is unremarkable.  Physical Exam: Blood pressure 99/56, pulse 65, temperature 97.7 F (36.5 C), temperature source Oral, resp. rate 21, height 5\' 6"  (1.676 m), weight 75.2 kg (165 lb 12.6 oz), SpO2 100.00%.    General appearance: alert, cooperative, appears older than stated age and no distress Head: Normocephalic, without obvious abnormality, atraumatic Eyes: PERRLA, C and S clear, EOMI Neck: no adenopathy, no carotid bruit, no JVD and supple, symmetrical, trachea midline Lungs: Bilateral wheezes Heart: regular rate and rhythm, S1, S2 normal,  no murmur, click, rub or gallop Abdomen: soft, non-tender; bowel sounds normal; no masses,  no organomegaly Extremities: extremities normal, atraumatic, no cyanosis or edema Pulses: Femoral pulses are 2+ with faint bruits, peripheral pulses diminished Skin: Skin color, texture, turgor normal. No rashes or lesions Neurologic: Alert and oriented X 3, normal strength and tone. Normal symmetric reflexes. Normal coordination and gait  Labs: CBC    Component Value Date/Time   WBC 6.9 05/10/2011 0500   RBC 4.65 05/10/2011 0500   HGB 13.0 05/10/2011 0500   HCT 38.6 05/10/2011 0500   PLT 221 05/10/2011 0500   MCV 83.0 05/10/2011 0500   MCH 28.0 05/10/2011 0500   MCHC 33.7 05/10/2011 0500   RDW 14.5 05/10/2011 0500   LYMPHSABS 3.4 05/09/2011 1743    MONOABS 0.5 05/09/2011 1743   EOSABS 0.1 05/09/2011 1743   BASOSABS 0.1 05/09/2011 1743   CMP     Component Value Date/Time   NA 138 05/10/2011 0500   K 4.8 05/10/2011 0500   CL 105 05/10/2011 0500   CO2 26 05/10/2011 0500   GLUCOSE 152* 05/10/2011 0500   BUN 8 05/10/2011 0500   CREATININE 0.72 05/10/2011 0500   CALCIUM 8.6 05/10/2011 0500   PROT 6.8 05/10/2011 0500   ALBUMIN 3.1* 05/10/2011 0500   AST 10 05/10/2011 0500   ALT 8 05/10/2011 0500   ALKPHOS 97 05/10/2011 0500   BILITOT 0.1* 05/10/2011 0500   GFRNONAA 89* 05/10/2011 0500   GFRAA >90 05/10/2011 0500   Cardiac Panel (last 3 results)  Basename 05/10/11 0850 05/09/11 2338 05/09/11 1743  CKTOTAL 31 31 32  CKMB 1.7 1.4 1.4  TROPONINI <0.30 <0.30 <0.30  RELINDX RELATIVE INDEX IS INVALID RELATIVE INDEX IS INVALID RELATIVE INDEX IS INVALID   BNP    Component Value Date/Time   PROBNP 39.2 05/09/2011 2338   TSH   11.003   Radiology: Borderline cardiomegaly, chronic interstitial changes  EKG: Minor nonspecific ST changes laterally. Normal sinus rhythm.  Signed:  Darden Palmer MD [facility]   Cardiology Consultant  05/10/2011, 1:26 PM

## 2011-05-10 NOTE — Progress Notes (Signed)
ANTICOAGULATION CONSULT NOTE - Follow Up Consult  Pharmacy Consult for heparin Indication: USAP  Labs:  Basename 05/10/11 0500 05/09/11 2338 05/09/11 1743  HGB 13.0 -- 12.7  HCT 38.6 -- 37.4  PLT 221 -- 278  APTT -- -- 27  LABPROT -- -- 14.2  INR -- -- 1.08  HEPARINUNFRC 0.30 -- --  CREATININE -- -- 0.83  CKTOTAL -- 31 32  CKMB -- 1.4 1.4  TROPONINI -- <0.30 <0.30    Assessment: 66yo female therapeutic on heparin with initial dosing for USAP but at very low end of goal.  Goal of Therapy:  Heparin level 0.3-0.7 units/ml   Plan:  Will increase gtt slightly to 1100 units/hr to ensure stays within goal and check level with final CE.  Colleen Can PharmD BCPS 05/10/2011,6:55 AM

## 2011-05-11 ENCOUNTER — Encounter (HOSPITAL_COMMUNITY)
Admission: EM | Disposition: A | Discharge status: Home / Self Care | Payer: Self-pay | Source: Ambulatory Visit | Attending: Internal Medicine

## 2011-05-11 ENCOUNTER — Other Ambulatory Visit: Payer: Self-pay

## 2011-05-11 DIAGNOSIS — I517 Cardiomegaly: Secondary | ICD-10-CM

## 2011-05-11 DIAGNOSIS — R079 Chest pain, unspecified: Secondary | ICD-10-CM

## 2011-05-11 DIAGNOSIS — I2510 Atherosclerotic heart disease of native coronary artery without angina pectoris: Secondary | ICD-10-CM

## 2011-05-11 HISTORY — PX: LEFT HEART CATHETERIZATION WITH CORONARY ANGIOGRAM: SHX5451

## 2011-05-11 LAB — GLUCOSE, CAPILLARY: Glucose-Capillary: 114 mg/dL — ABNORMAL HIGH (ref 70–99)

## 2011-05-11 LAB — CBC
Hemoglobin: 10.9 g/dL — ABNORMAL LOW (ref 12.0–15.0)
MCH: 27.2 pg (ref 26.0–34.0)
MCHC: 33.1 g/dL (ref 30.0–36.0)
MCV: 82 fL (ref 78.0–100.0)
Platelets: 228 10*3/uL (ref 150–400)
RBC: 3.96 MIL/uL (ref 3.87–5.11)
RBC: 4.01 MIL/uL (ref 3.87–5.11)
RDW: 14.1 % (ref 11.5–15.5)
WBC: 7.6 10*3/uL (ref 4.0–10.5)
WBC: 6.2 10*3/uL (ref 4.0–10.5)

## 2011-05-11 LAB — HEPARIN LEVEL (UNFRACTIONATED): Heparin Unfractionated: 0.34 IU/mL (ref 0.30–0.70)

## 2011-05-11 LAB — CREATININE, SERUM: Creatinine, Ser: 0.81 mg/dL (ref 0.50–1.10)

## 2011-05-11 SURGERY — LEFT HEART CATHETERIZATION WITH CORONARY ANGIOGRAM
Anesthesia: Moderate Sedation

## 2011-05-11 MED ORDER — FENTANYL CITRATE 0.05 MG/ML IJ SOLN
INTRAMUSCULAR | Status: AC
  Filled 2011-05-11: qty 2

## 2011-05-11 MED ORDER — SODIUM CHLORIDE 0.9 % IV SOLN: INTRAVENOUS | Status: AC

## 2011-05-11 MED ORDER — LIDOCAINE HCL (PF) 1 % IJ SOLN
INTRAMUSCULAR | Status: AC
  Filled 2011-05-11: qty 30

## 2011-05-11 MED ORDER — VERAPAMIL HCL 2.5 MG/ML IV SOLN
INTRAVENOUS | Status: AC
  Filled 2011-05-11: qty 2

## 2011-05-11 MED ORDER — NITROGLYCERIN 0.2 MG/ML ON CALL CATH LAB
INTRAVENOUS | Status: AC
  Filled 2011-05-11: qty 1

## 2011-05-11 MED ORDER — FENTANYL CITRATE 0.05 MG/ML IJ SOLN
50.0000 ug | Freq: Once | INTRAMUSCULAR | Status: AC
  Administered 2011-05-11: 50 ug via INTRAVENOUS

## 2011-05-11 MED ORDER — LEVOTHYROXINE SODIUM 100 MCG PO TABS
100.0000 ug | ORAL_TABLET | Freq: Every day | ORAL | Status: DC
  Administered 2011-05-12: 100 ug via ORAL
  Filled 2011-05-11: qty 1

## 2011-05-11 MED ORDER — HEPARIN SODIUM (PORCINE) 1000 UNIT/ML IJ SOLN
INTRAMUSCULAR | Status: AC
  Filled 2011-05-11: qty 1

## 2011-05-11 MED ORDER — HEPARIN SODIUM (PORCINE) 5000 UNIT/ML IJ SOLN
5000.0000 [IU] | Freq: Three times a day (TID) | INTRAMUSCULAR | Status: DC
  Administered 2011-05-11 – 2011-05-12 (×2): 5000 [IU] via SUBCUTANEOUS
  Filled 2011-05-11 (×5): qty 1

## 2011-05-11 MED ORDER — ACETAMINOPHEN 325 MG PO TABS: 650.0000 mg | ORAL_TABLET | ORAL | Status: DC | PRN

## 2011-05-11 MED ORDER — ASPIRIN 81 MG PO CHEW
CHEWABLE_TABLET | ORAL | Status: AC
  Filled 2011-05-11: qty 4

## 2011-05-11 MED ORDER — ONDANSETRON HCL 4 MG/2ML IJ SOLN: 4.0000 mg | Freq: Four times a day (QID) | INTRAMUSCULAR | Status: DC | PRN

## 2011-05-11 MED ORDER — MIDAZOLAM HCL 2 MG/2ML IJ SOLN
INTRAMUSCULAR | Status: AC
  Filled 2011-05-11: qty 2

## 2011-05-11 MED ORDER — HEPARIN (PORCINE) IN NACL 2-0.9 UNIT/ML-% IJ SOLN
INTRAMUSCULAR | Status: AC
  Filled 2011-05-11: qty 2000

## 2011-05-11 NOTE — Progress Notes (Signed)
Patient ID: April Branch, female   DOB: 03/25/1945, 65 y.o.   MRN: [id number] Subjective:  Chest pain resolved.  Objective:  Vital Signs in the last 24 hours: Temp:  [97.7 F (36.5 C)-98.3 F (36.8 C)] 98.2 F (36.8 C) (03/18 0400) Pulse Rate:  [62-75] 62  (03/18 0400) Resp:  [15-21] 15  (03/18 0012) BP: (99-126)/(54-68) 115/58 mmHg (03/18 0400) SpO2:  [95 %-100 %] 97 % (03/18 0400) Weight:  [77.4 kg (170 lb 10.2 oz)] 77.4 kg (170 lb 10.2 oz) (03/18 0400)  Intake/Output from previous day: 03/17 0701 - 03/18 0700 In: 2981 [P.O.:720; I.V.:2261] Out: 2301 [Urine:2301] Intake/Output from this shift:    Physical Exam: Well appearing NAD HEENT: Unremarkable Neck:  No JVD, no thyromegally Lungs:  Clear with minimal rales HEART:  Regular rate rhythm, no murmurs, no rubs, no clicks Abd:  Flat, positive bowel sounds, no organomegally, no rebound, no guarding Ext:  2 plus pulses, no edema, no cyanosis, no clubbing Skin:  No rashes no nodules Neuro:  CN II through XII intact, motor grossly intact  Lab Results:  Basename 05/11/11 0530 05/10/11 0500  WBC 7.6 6.9  HGB 10.9* 13.0  PLT 238 221    Basename 05/10/11 0500 05/09/11 1743  NA 138 136  K 4.8 4.3  CL 105 100  CO2 26 24  GLUCOSE 152* 208*  BUN 8 12  CREATININE 0.72 0.83    Basename 05/10/11 1457 05/10/11 0850  TROPONINI <0.30 <0.30   Hepatic Function Panel  Basename 05/10/11 0500  PROT 6.8  ALBUMIN 3.1*  AST 10  ALT 8  ALKPHOS 97  BILITOT 0.1*  BILIDIR --  IBILI --   No results found for this basename: CHOL in the last 72 hours No results found for this basename: PROTIME in the last 72 hours  Imaging: Ct Angio Chest W/cm &/or Wo Cm  05/09/2011  *RADIOLOGY REPORT*  Clinical Data: Chest pain.  Elevated D-dimer.  CT ANGIOGRAPHY CHEST  Technique:  Multidetector CT imaging of the chest using the standard protocol during bolus administration of intravenous contrast. Multiplanar reconstructed images  including MIPs were obtained and reviewed to evaluate the vascular anatomy.  Contrast: 100mL OMNIPAQUE IOHEXOL 350 MG/ML IV SOLN  Comparison: Chest x-ray 05/09/2011.  Findings: Pulmonary arterial opacification is excellent.  No focal filling defect is evident to suggest pulmonary embolus.  The heart size is normal.  Atherosclerotic calcifications are noted within the coronary arteries.  Atherosclerotic calcifications are present in the aorta branch vessels as well.  There is focal stenosis at the origin of the vertebral arteries bilaterally.  Limited imaging of the upper abdomen is unremarkable.  Patchy ground-glass attenuation is present throughout both lungs. Focal bronchiectasis is present in the medial right upper lobe. There is some associated airspace disease.  The bone windows demonstrate multilevel endplate degenerative change.  No focal lytic or blastic lesions are evident.  IMPRESSION:  1.  No evidence of pulmonary embolus. 2.  Diffuse ground-glass attenuation throughout the lungs.  This likely represents edema.  Infection is not excluded. 3.  More focal ill-defined airspace disease within the medial right upper lobe is concerning for infection associated with focal bronchiectasis. 4.  Extensive atherosclerotic changes including coronary artery disease.  Original Report Authenticated By: CHRISTOPHER W. MATTERN, M.D.   Dg Chest Port 1 View  05/09/2011  *RADIOLOGY REPORT*  Clinical Data: Chest pain.  PORTABLE CHEST - 1 VIEW  Comparison: Two-view chest 09/07/2010.  Findings: The heart is upper limits of   normal for size.  Mild interstitial coarsening is chronic.  No focal airspace disease is evident.  There is no edema or effusion to suggest failure.  The visualized soft tissues and bony thorax are unremarkable.  IMPRESSION:  1.  No acute cardiopulmonary disease. 2.  Stable chronic interstitial coarsening. 3.  Borderline cardiomegaly without failure.  Original Report Authenticated By: CHRISTOPHER W.  MATTERN, M.D.    Cardiac Studies: Tele - NSR Assessment/Plan:  1. Chest pain 2. Multiple cardiac risk factors including ongoing tobacco abuse 3. Coronary calcificationon CT scan Rec: for catheterization today. I have discussed the importance of smoking cessation and she states" this is a wake up call for me"  LOS: 2 days    Brandee Markin 05/11/2011, 8:23 AM     

## 2011-05-11 NOTE — H&P (View-Only) (Signed)
Patient ID: April Branch, female   DOB: 04-20-1945, 66 y.o.   MRN: [id number] Subjective:  Chest pain resolved.  Objective:  Vital Signs in the last 24 hours: Temp:  [97.7 F (36.5 C)-98.3 F (36.8 C)] 98.2 F (36.8 C) (03/18 0400) Pulse Rate:  [62-75] 62  (03/18 0400) Resp:  [15-21] 15  (03/18 0012) BP: (99-126)/(54-68) 115/58 mmHg (03/18 0400) SpO2:  [95 %-100 %] 97 % (03/18 0400) Weight:  [77.4 kg (170 lb 10.2 oz)] 77.4 kg (170 lb 10.2 oz) (03/18 0400)  Intake/Output from previous day: 03/17 0701 - 03/18 0700 In: 2981 [P.O.:720; I.V.:2261] Out: 2301 [Urine:2301] Intake/Output from this shift:    Physical Exam: Well appearing NAD HEENT: Unremarkable Neck:  No JVD, no thyromegally Lungs:  Clear with minimal rales HEART:  Regular rate rhythm, no murmurs, no rubs, no clicks Abd:  Flat, positive bowel sounds, no organomegally, no rebound, no guarding Ext:  2 plus pulses, no edema, no cyanosis, no clubbing Skin:  No rashes no nodules Neuro:  CN II through XII intact, motor grossly intact  Lab Results:  Basename 05/11/11 0530 05/10/11 0500  WBC 7.6 6.9  HGB 10.9* 13.0  PLT 238 221    Basename 05/10/11 0500 05/09/11 1743  NA 138 136  K 4.8 4.3  CL 105 100  CO2 26 24  GLUCOSE 152* 208*  BUN 8 12  CREATININE 0.72 0.83    Basename 05/10/11 1457 05/10/11 0850  TROPONINI <0.30 <0.30   Hepatic Function Panel  Basename 05/10/11 0500  PROT 6.8  ALBUMIN 3.1*  AST 10  ALT 8  ALKPHOS 97  BILITOT 0.1*  BILIDIR --  IBILI --   No results found for this basename: CHOL in the last 72 hours No results found for this basename: PROTIME in the last 72 hours  Imaging: Ct Angio Chest W/cm &/or Wo Cm  05/09/2011  *RADIOLOGY REPORT*  Clinical Data: Chest pain.  Elevated D-dimer.  CT ANGIOGRAPHY CHEST  Technique:  Multidetector CT imaging of the chest using the standard protocol during bolus administration of intravenous contrast. Multiplanar reconstructed images  including MIPs were obtained and reviewed to evaluate the vascular anatomy.  Contrast: OMNIPAQUE IOHEXOL 350 MG/ML IV SOLN  Comparison: Chest x-ray 05/09/2011.  Findings: Pulmonary arterial opacification is excellent.  No focal filling defect is evident to suggest pulmonary embolus.  The heart size is normal.  Atherosclerotic calcifications are noted within the coronary arteries.  Atherosclerotic calcifications are present in the aorta branch vessels as well.  There is focal stenosis at the origin of the vertebral arteries bilaterally.  Limited imaging of the upper abdomen is unremarkable.  Patchy ground-glass attenuation is present throughout both lungs. Focal bronchiectasis is present in the medial right upper lobe. There is some associated airspace disease.  The bone windows demonstrate multilevel endplate degenerative change.  No focal lytic or blastic lesions are evident.  IMPRESSION:  1.  No evidence of pulmonary embolus. 2.  Diffuse ground-glass attenuation throughout the lungs.  This likely represents edema.  Infection is not excluded. 3.  More focal ill-defined airspace disease within the medial right upper lobe is concerning for infection associated with focal bronchiectasis. 4.  Extensive atherosclerotic changes including coronary artery disease.  Original Report Authenticated By: Jamesetta Orleans. MATTERN, M.D.   Dg Chest Port 1 View  05/09/2011  *RADIOLOGY REPORT*  Clinical Data: Chest pain.  PORTABLE CHEST - 1 VIEW  Comparison: Two-view chest 09/07/2010.  Findings: The heart is upper limits of  normal for size.  Mild interstitial coarsening is chronic.  No focal airspace disease is evident.  There is no edema or effusion to suggest failure.  The visualized soft tissues and bony thorax are unremarkable.  IMPRESSION:  1.  No acute cardiopulmonary disease. 2.  Stable chronic interstitial coarsening. 3.  Borderline cardiomegaly without failure.  Original Report Authenticated By: Jamesetta Orleans.  MATTERN, M.D.    Cardiac Studies: Tele - NSR Assessment/Plan:  1. Chest pain 2. Multiple cardiac risk factors including ongoing tobacco abuse 3. Coronary calcificationon CT scan Rec: for catheterization today. I have discussed the importance of smoking cessation and she states" this is a wake up call for me"  LOS: 2 days    Lewayne Bunting 05/11/2011, 8:23 AM

## 2011-05-11 NOTE — Progress Notes (Signed)
I was notified that pt was going from the Cath lab to 3700. Pt belongings bagged and brought to room 3710.

## 2011-05-11 NOTE — Progress Notes (Signed)
ANTICOAGULATION CONSULT NOTE - Follow Up Consult  Pharmacy Consult for heparin Indication: chest pain/ACS  Vital Signs: Temp: 98.3 F (36.8 C) (03/18 0800) Temp src: Oral (03/18 0800) BP: 121/56 mmHg (03/18 0800) Pulse Rate: 67  (03/18 0800)  Labs:  Basename 05/11/11 0530 05/10/11 1457 05/10/11 0850 05/10/11 0500 05/09/11 2338 05/09/11 1743  HGB 10.9* -- -- 13.0 -- --  HCT 32.9* -- -- 38.6 -- 37.4  PLT 238 -- -- 221 -- 278  APTT -- -- -- -- -- 27  LABPROT -- -- -- -- -- 14.2  INR -- -- -- -- -- 1.08  HEPARINUNFRC 0.34 0.40 -- 0.30 -- --  CREATININE -- -- -- 0.72 -- 0.83  CKTOTAL -- 33 31 -- 31 --  CKMB -- 1.7 1.7 -- 1.4 --  TROPONINI -- <0.30 <0.30 -- <0.30 --   Estimated Creatinine Clearance: 75.6 ml/min (by C-G formula based on Cr of 0.72).   Medications:  Scheduled:    . amitriptyline  75 mg Oral QHS  . aspirin  324 mg Oral Pre-Cath  . aspirin EC  81 mg Oral Daily  . clopidogrel  75 mg Oral Daily  . diazepam  10 mg Oral On Call  . docusate sodium  100 mg Oral BID  . insulin aspart  0-15 Units Subcutaneous TID WC  . insulin aspart  0-5 Units Subcutaneous QHS  . insulin glargine  60 Units Subcutaneous QHS  . levothyroxine  88 mcg Oral Daily  . LORazepam  1 mg Oral TID  . metoprolol tartrate  25 mg Oral BID  . nicotine  21 mg Transdermal Daily  . pantoprazole  40 mg Oral BID AC  . simvastatin  5 mg Oral q1800  . tolterodine  4 mg Oral Daily  . DISCONTD: sodium chloride   Intravenous STAT  . DISCONTD: naproxen  250 mg Oral BID WC  . DISCONTD: pantoprazole  20 mg Oral Q1200    Assessment: 65 yof on heparin for CP. Heparin level this AM is at goal at 0.34, no bleeding noted. CE's negative and CP resolved. Plan for cath today.   Also noted that pt is on synthroid for hypothyroidism. She is currently on her home dose of daily. However, TSH is >11. May need to have her dose adjusted.   Goal of Therapy:  Heparin level 0.3-0.7 units/ml   Plan:  1. Continue  heparin at 1100units/hr 2. F/u post-cath plans 3. Consider increasing synthroid dose  April Branch, April Branch 05/11/2011,10:22 AM

## 2011-05-11 NOTE — Progress Notes (Signed)
  Echocardiogram 2D Echocardiogram has been performed.  Vincient Vanaman F 05/11/2011, 10:09 AM

## 2011-05-11 NOTE — Interval H&P Note (Signed)
History and Physical Interval Note:  05/11/2011 12:09 PM  April Branch  has presented today for surgery, with the diagnosis of Chest Pain  The various methods of treatment have been discussed with the patient and family. After consideration of risks, benefits and other options for treatment, the patient has consented to  Procedure(s) (LRB): LEFT HEART CATHETERIZATION WITH CORONARY ANGIOGRAM (N/A) as a surgical intervention .  The patients' history has been reviewed, patient examined, no change in status, stable for surgery.  I have reviewed the patients' chart and labs.  Questions were answered to the patient's satisfaction.     Keefer Soulliere

## 2011-05-11 NOTE — Op Note (Signed)
Cardiac Cath Procedure Note:  Indication: Chest pain  Procedures performed:  1) Selective coronary angiography 2) Left heart catheterization 3) Left ventriculogram  Description of procedure:   The risks and indication of the procedure were explained. Consent was signed and placed on the chart. An appropriate timeout was taken prior to the procedure. After a normal Allen's test was confirmed, the right wrist was prepped and draped in the routine sterile fashion and anesthetized with 1% local lidocaine.   A 5 FR arterial sheath was then placed in the right radial artery using a modified Seldinger technique. Systemic heparin was administered. 3mg  IV verapamil was given through the sheath. Standard catheters including a JL 3.5, No-Torque right and straight pigtail were used. All catheter exchanges were made over a wire.  Complications:  None apparent  Findings:  Ao Pressure: 143/63 (93) LV Pressure:  156/8/17 There was no signficant gradient across the aortic valve on pullback.  Left main:  Normal  LAD: Large vessel which tails to the L distally. 20-30% mid lesion.  LCX: Large co-dominant vessel. Large OM-1. Small OM-2. Several small PLs. 30% stenosis mid AV groove  RCA: Small co-dominant vessel. Diffuse 30% prox through mid section. 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  Assessment: 1. 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 Normal LVEF  Plan/Discussion:  Despite CAD, I suspect CP is non-cardiac. Continue medical treatment. Consider GI work-up.  Marium Ragan 12:49 PM

## 2011-05-11 NOTE — Progress Notes (Addendum)
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 Has no nausea or diaphoresis. Chest pain has persisted and not relieved by Nitro or morphine.  Pt is seen in her room on 3700 post cath.  She denies f/c, sob, n/v, or chest pain.  She is resting comfortably at present.    Objective: Weight change: 6.6 kg (14 lb 8.8 oz)  Intake/Output Summary (Last 24 hours) at 05/11/11 1603 Last data filed at 05/11/11 1122  Gross per 24 hour  Intake 1776.5 ml  Output   2500 ml  Net -723.5 ml   Blood pressure 118/85, pulse 63, temperature 98.1 F (36.7 C), temperature source Oral, resp. rate 15, height 5\' 6"  (1.676 m), weight 77.4 kg (170 lb 10.2 oz), SpO2 97.00%.  CBG (last 3)   Basename 05/11/11 1309 05/11/11 1115 05/11/11 0811  GLUCAP 74 91 114*   Physical Exam: General: No acute respiratory distress Lungs: Clear to auscultation bilaterally without wheezes or crackles Cardiovascular: Regular rate and rhythm without murmur gallop or rub normal S1 and S2 Abdomen: Nontender, nondistended, soft, bowel sounds positive, no rebound, no ascites, no appreciable mass Extremities: No significant cyanosis, clubbing, or edema bilateral lower extremities  Lab Results:  Basename 05/10/11 0500 05/09/11 1743  NA 138 136  K 4.8 4.3  CL 105 100  CO2 26 24  GLUCOSE 152* 208*  BUN 8 12  CREATININE 0.72 0.83  CALCIUM 8.6 8.9  MG -- --  PHOS -- --    Basename 05/10/11 0500 05/09/11 1743  AST 10 12  ALT 8 10  ALKPHOS 97 98  BILITOT 0.1* 0.1*  PROT 6.8 6.9  ALBUMIN 3.1* 3.2*    Basename 05/11/11 0530 05/10/11 0500 05/09/11 1743  WBC 7.6 6.9 9.5  NEUTROABS -- -- 5.4  HGB 10.9* 13.0 12.7  HCT 32.9* 38.6 37.4  MCV 83.1 83.0 81.8  PLT 238 221 278    Basename 05/10/11 1457 05/10/11 0850 05/09/11 2338  CKTOTAL 33 31 31    CKMB 1.7 1.7 1.4  CKMBINDEX -- -- --  TROPONINI <0.30 <0.30 <0.30    Basename 05/09/11 2333  HGBA1C 7.8*    Micro Results: Recent Results (from the past 240 hour(s))  URINE CULTURE     Status: Normal   Collection Time   05/09/11  6:35 PM      Component Value Range Status Comment   Specimen Description URINE, CLEAN CATCH   Final    Special Requests NONE   Final    Culture  Setup Time [id number]   Final    Colony Count 85,000 COLONIES/ML   Final    Culture     Final    Value: Multiple bacterial morphotypes present, none predominant. Suggest appropriate recollection if clinically indicated.   Report Status 05/10/2011 FINAL   Final   MRSA PCR SCREENING     Status: Normal   Collection Time   05/09/11 11:15 PM      Component Value Range Status Comment   MRSA by PCR NEGATIVE  NEGATIVE  Final     Studies/Results: All recent x-ray/radiology reports have been reviewed in detail.   Medications: I have reviewed the patient's complete medication list.  Assessment/Plan:  Unstable angina/ chest pain Now s/p cardiac cath revealing "essentially nonobstuctive CAD with diffuse distal disease in distal RCA  consistent with diabetic vasculopathy" and normal EF - plan is to continue medical tx   HYPERTENSION Reasonably well controlled at present  DM Reasonably well controlled at present  PVD-s/p L pop-tib graft (now failed) Experiences chronic claudication sx w/ exertion per old notes  Hyperlipidemia On medical tx  Hypothyroid On medical tx, but TSH high - have increased synthroid dose - will need f/u TSH in 8-12 weeks  COPD Well compensated at present   Ongoing tobacco abuse Abstinence has been advises - counseling requested  GERD  Obesity  Dispo Probable d/c home Tuesday AM to consider oupt w/u for ? GI source of pain  Lonia Blood, MD [name] Office  [phone] Pager [phone]  On-Call/Text Page:      [email or web address]      password  [facility]

## 2011-05-12 LAB — CBC
HCT: 33.8 % — ABNORMAL LOW (ref 36.0–46.0)
Hemoglobin: 11.4 g/dL — ABNORMAL LOW (ref 12.0–15.0)
MCH: 27.4 pg (ref 26.0–34.0)
MCHC: 33.7 g/dL (ref 30.0–36.0)
RDW: 14 % (ref 11.5–15.5)

## 2011-05-12 LAB — GLUCOSE, CAPILLARY

## 2011-05-12 MED ORDER — DSS 100 MG PO CAPS: 100.0000 mg | ORAL_CAPSULE | Freq: Two times a day (BID) | ORAL | Status: AC

## 2011-05-12 MED ORDER — NAPROXEN 250 MG PO TABS: 250.0000 mg | ORAL_TABLET | Freq: Two times a day (BID) | ORAL | Status: DC

## 2011-05-12 MED ORDER — OXYCODONE-ACETAMINOPHEN 10-325 MG PO TABS: 1.0000 | ORAL_TABLET | ORAL | Status: AC | PRN

## 2011-05-12 MED ORDER — METOPROLOL TARTRATE 25 MG PO TABS: 25.0000 mg | ORAL_TABLET | Freq: Two times a day (BID) | ORAL | Status: DC

## 2011-05-12 NOTE — Progress Notes (Signed)
Utilization review completed.  

## 2011-05-12 NOTE — Discharge Summary (Signed)
DISCHARGE SUMMARY  JASMAIN AHLBERG  MR#: [id number]  DOB:1945-11-22  Date of Admission: 05/09/2011 Date of Discharge: 05/12/2011  Attending Physician:Jarquavious Fentress  Patient's JYN:[id number],YQMVHQ M, MD, MD  Consults: Cardiology- [place]  Presenting Complaint: Chest pain  Discharge Diagnoses: Principal Problem:  *Chest pain- likely costochondritis  Active Problems:  Insulin dependent type 2 diabetes mellitus, controlled  Hypertension  Peripheral vascular disease  CAD (coronary artery disease)  Hyperlipidemia    Discharge Medications: Medication List  As of 05/12/2011  3:18 PM   STOP taking these medications         HYDROcodone-acetaminophen 10-325 MG per tablet         TAKE these medications         amitriptyline 75 MG tablet   Commonly known as: ELAVIL   Take 75 mg by mouth at bedtime.      clopidogrel 75 MG tablet   Commonly known as: PLAVIX   Take 75 mg by mouth daily.      cyclobenzaprine 5 MG tablet   Commonly known as: FLEXERIL   Take 5 mg by mouth 2 (two) times daily as needed. For muscle spasms      DSS 100 MG Caps   Take 100 mg by mouth 2 (two) times daily.      insulin aspart 100 UNIT/ML injection   Commonly known as: novoLOG   Inject 4-6 Units into the skin 3 (three) times daily before meals.      insulin glargine 100 UNIT/ML injection   Commonly known as: LANTUS   Inject 60 Units into the skin at bedtime.      lansoprazole 30 MG capsule   Commonly known as: PREVACID   Take 30 mg by mouth daily.      levothyroxine 88 MCG tablet   Commonly known as: SYNTHROID, LEVOTHROID   Take 88 mcg by mouth daily.      LORazepam 1 MG tablet   Commonly known as: ATIVAN   Take 1 mg by mouth 3 (three) times daily.      metFORMIN 850 MG tablet   Commonly known as: GLUCOPHAGE   Take 850 mg by mouth 2 (two) times daily with a meal.      metoprolol tartrate 25 MG tablet   Commonly known as: LOPRESSOR   Take 1 tablet (25 mg total) by mouth 2 (two)  times daily.      naproxen 250 MG tablet   Commonly known as: NAPROSYN   Take 1 tablet (250 mg total) by mouth 2 (two) times daily with a meal.      oxyCODONE-acetaminophen 10-325 MG per tablet   Commonly known as: PERCOCET   Take 1 tablet by mouth every 4 (four) hours as needed for pain.      pravastatin 20 MG tablet   Commonly known as: PRAVACHOL   Take 20 mg by mouth daily.      tolterodine 4 MG 24 hr capsule   Commonly known as: DETROL LA   Take 4 mg by mouth daily.             Procedures: Ct Angio Chest W/cm &/or Wo Cm  05/09/2011  *RADIOLOGY REPORT*  Clinical Data: Chest pain.  Elevated D-dimer.  CT ANGIOGRAPHY CHEST  Technique:  Multidetector CT imaging of the chest using the standard protocol during bolus administration of intravenous contrast. Multiplanar reconstructed images including MIPs were obtained and reviewed to evaluate the vascular anatomy.  Contrast: OMNIPAQUE IOHEXOL 350 MG/ML IV SOLN  Comparison: Chest  x-ray 05/09/2011.  Findings: Pulmonary arterial opacification is excellent.  No focal filling defect is evident to suggest pulmonary embolus.  The heart size is normal.  Atherosclerotic calcifications are noted within the coronary arteries.  Atherosclerotic calcifications are present in the aorta branch vessels as well.  There is focal stenosis at the origin of the vertebral arteries bilaterally.  Limited imaging of the upper abdomen is unremarkable.  Patchy ground-glass attenuation is present throughout both lungs. Focal bronchiectasis is present in the medial right upper lobe. There is some associated airspace disease.  The bone windows demonstrate multilevel endplate degenerative change.  No focal lytic or blastic lesions are evident.  IMPRESSION:  1.  No evidence of pulmonary embolus. 2.  Diffuse ground-glass attenuation throughout the lungs.  This likely represents edema.  Infection is not excluded. 3.  More focal ill-defined airspace disease within the medial  right upper lobe is concerning for infection associated with focal bronchiectasis. 4.  Extensive atherosclerotic changes including coronary artery disease.  Original Report Authenticated By: Jamesetta Orleans. MATTERN, M.D.   Dg Chest Port 1 View  05/09/2011  *RADIOLOGY REPORT*  Clinical Data: Chest pain.  PORTABLE CHEST - 1 VIEW  Comparison: Two-view chest 09/07/2010.  Findings: The heart is upper limits of normal for size.  Mild interstitial coarsening is chronic.  No focal airspace disease is evident.  There is no edema or effusion to suggest failure.  The visualized soft tissues and bony thorax are unremarkable.  IMPRESSION:  1.  No acute cardiopulmonary disease. 2.  Stable chronic interstitial coarsening. 3.  Borderline cardiomegaly without failure.  Original Report Authenticated By: Jamesetta Orleans. MATTERN, M.D.   Cardiac Cath Assessment:  1. 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 Normal LVEF Daniel Bensimhon  12:49 PM  Echocardiogram Study Conclusions  - Left ventricle: The cavity size was normal. Wall thickness was increased in a pattern of mild LVH. Systolic function was normal. The estimated ejection fraction was in the range of 55% to 60%. Features are consistent with a pseudonormal left ventricular filling pattern, with concomitant abnormal relaxation and increased filling pressure (grade 2 diastolic dysfunction). - Mitral valve: Calcified annulus.    Hospital Course: Chest pain: Patient presented with chest pain which improved after 2 nitro were given. Therefore a Nitro infusion was started and a cardiology consult was requested. She underwent a cardiac cath with results mentioned above. Dr Gala Romney mentions in his note that he does not think the current pain is cardiac. The patient continues to have it today despite Vicodin. Upon further investigation, she is noted to have point tenderness in the right  5th costochondral joint and I suspect this is the reason for her constant pain. She is requesting Percocet which I have prescribed but I also recommend at least 3-4 days of Naprosyn to help decrease the inflammation.    Day of Discharge Physical Exam: BP 156/88  Pulse 61  Temp(Src) 98.4 F (36.9 C) (Oral)  Resp 18  Ht 5\' 6"  (1.676 m)  Wt 83.462 kg (184 lb)  BMI 29.70 kg/m2  SpO2 95% General: No acute respiratory distress  Lungs: Clear to auscultation bilaterally without wheezes or crackles  Cardiovascular: Regular rate and rhythm without murmur gallop or rub normal S1 and S2  Abdomen: Nontender, nondistended, soft, bowel sounds positive, no rebound, no ascites, no appreciable mass  Extremities: No significant cyanosis, clubbing, or edema bilateral lower extremities   Results for orders placed during the  hospital encounter of 05/09/11 (from the past 24 hour(s))  GLUCOSE, CAPILLARY     Status: Abnormal   Collection Time   05/11/11  4:37 PM      Component Value Range   Glucose-Capillary 228 (*) 70 - 99 (mg/dL)  CBC     Status: Abnormal   Collection Time   05/11/11  5:17 PM      Component Value Range   WBC 6.2  4.0 - 10.5 (K/uL)   RBC 4.01  3.87 - 5.11 (MIL/uL)   Hemoglobin 10.9 (*) 12.0 - 15.0 (g/dL)   HCT 16.1 (*) 09.6 - 46.0 (%)   MCV 82.0  78.0 - 100.0 (fL)   MCH 27.2  26.0 - 34.0 (pg)   MCHC 33.1  30.0 - 36.0 (g/dL)   RDW 04.5  40.9 - 81.1 (%)   Platelets 228  150 - 400 (K/uL)  CREATININE, SERUM     Status: Abnormal   Collection Time   05/11/11  5:17 PM      Component Value Range   Creatinine, Ser 0.81  0.50 - 1.10 (mg/dL)   GFR calc non Af Amer 76 (*) >90 (mL/min)   GFR calc Af Amer 88 (*) >90 (mL/min)  GLUCOSE, CAPILLARY     Status: Abnormal   Collection Time   05/11/11  9:34 PM      Component Value Range   Glucose-Capillary 175 (*) 70 - 99 (mg/dL)  CBC     Status: Abnormal   Collection Time   05/12/11  5:30 AM      Component Value Range   WBC 7.4  4.0 - 10.5  (K/uL)   RBC 4.16  3.87 - 5.11 (MIL/uL)   Hemoglobin 11.4 (*) 12.0 - 15.0 (g/dL)   HCT 91.4 (*) 78.2 - 46.0 (%)   MCV 81.3  78.0 - 100.0 (fL)   MCH 27.4  26.0 - 34.0 (pg)   MCHC 33.7  30.0 - 36.0 (g/dL)   RDW 95.6  21.3 - 08.6 (%)   Platelets 238  150 - 400 (K/uL)  GLUCOSE, CAPILLARY     Status: Abnormal   Collection Time   05/12/11  7:30 AM      Component Value Range   Glucose-Capillary 120 (*) 70 - 99 (mg/dL)   Comment 1 Notify RN    GLUCOSE, CAPILLARY     Status: Abnormal   Collection Time   05/12/11 12:00 PM      Component Value Range   Glucose-Capillary 121 (*) 70 - 99 (mg/dL)   Comment 1 Notify RN      Disposition: stable   Follow-up Appts: Discharge Orders    Future Appointments: Provider: Department: Dept Phone: Center:   07/30/2011 11:00 AM [facility] [phone] [facility]   07/30/2011 11:30 AM Sherren Kerns, MD [facility] [phone] [facility]      Follow-up with PCP in 1-2 weeks.  Time on Discharge: >4min  Signed: Dollye Glasser 05/12/2011, 3:18 PM

## 2011-06-09 ENCOUNTER — Ambulatory Visit (INDEPENDENT_AMBULATORY_CARE_PROVIDER_SITE_OTHER): Payer: Medicare Other | Admitting: Cardiovascular Disease

## 2011-06-09 ENCOUNTER — Encounter: Payer: Self-pay | Admitting: Cardiovascular Disease

## 2011-06-09 VITALS — BP 118/80 | HR 82 | Wt 156.0 lb

## 2011-06-09 DIAGNOSIS — I10 Essential (primary) hypertension: Secondary | ICD-10-CM

## 2011-06-09 DIAGNOSIS — I2510 Atherosclerotic heart disease of native coronary artery without angina pectoris: Secondary | ICD-10-CM

## 2011-06-09 DIAGNOSIS — Z794 Long term (current) use of insulin: Secondary | ICD-10-CM

## 2011-06-09 DIAGNOSIS — I739 Peripheral vascular disease, unspecified: Secondary | ICD-10-CM

## 2011-06-09 DIAGNOSIS — E119 Type 2 diabetes mellitus without complications: Secondary | ICD-10-CM

## 2011-06-09 NOTE — Assessment & Plan Note (Signed)
F/U Dr Darrick Penna.  Neuropathic pain.  Encouraged her to see podiatrist.  Significant claudication with failed left graft

## 2011-06-09 NOTE — Assessment & Plan Note (Signed)
Well controlled.  Continue current medications and low sodium Dash type diet.    

## 2011-06-09 NOTE — Progress Notes (Signed)
This 66 year old female has a prior history of mild coronary artery disease. She had catheterization done previously by Dr. Fraser Din in 2003 with a 30% circumflex stenosis but not much disease otherwise. She has  had a dobutamine Cardiolite study done previously that was nonischemic in 2010. She has had significant claudication and recently had a left femoral-popliteal bypass graft that has since occluded. She is continued to smoke cigarettes.  She presented to the emergency room 3/15 with substernal chest discomfort described as a heaviness with some radiation up to her left ear and associated with some pulsating feeling. An EKG showed minor nonspecific ST changes and her cardiac enzymes have been negative. She is continued to have chest discomfort that is unrelieved despite the presence of nitroglycerin intravenously and she is currently being given morphine. A chest CT scan showed significant coronary calcifications and other vascular calcification. She is somewhat inactive but walks her dog at home and is mainly limited by claudication. She has significant issues with anxiety and depression. She has had a significant GI history in the past. She denies PND, orthopnea, palpitations, or syncope.   Cath by Dr Teressa Lower showed nonobstructive disease and patient D/C home  ROS: Denies fever, malais, weight loss, blurry vision, decreased visual acuity, cough, sputum, SOB, hemoptysis, pleuritic pain, palpitaitons, heartburn, abdominal pain, melena, lower extremity edema, claudication, or rash.  All other systems reviewed and negative  General: Affect appropriate Chronically ill COPD HEENT: normal Neck supple with no adenopathy JVP normal no bruits no thyromegaly Lungs clear with no wheezing and good diaphragmatic motion Heart:  S1/S2 no murmur, no rub, gallop or click PMI normal Abdomen: benighn, BS positve, no tenderness, no AAA no bruit.  No HSM or HJR Distal pulses cannot feel left PT/DP No  edema Neuro non-focal Skin warm and dry No muscular weakness   Current Outpatient Prescriptions  Medication Sig Dispense Refill  . amitriptyline (ELAVIL) 75 MG tablet Take 75 mg by mouth at bedtime.      . clopidogrel (PLAVIX) 75 MG tablet Take 75 mg by mouth daily.        . cyclobenzaprine (FLEXERIL) 5 MG tablet Take 5 mg by mouth 2 (two) times daily as needed. For muscle spasms      . HYDROcodone-acetaminophen (NORCO) 10-325 MG per tablet Take 1 tablet by mouth every 6 (six) hours as needed.      . insulin aspart (NOVOLOG) 100 UNIT/ML injection Inject 4-6 Units into the skin 3 (three) times daily before meals.        . insulin glargine (LANTUS) 100 UNIT/ML injection Inject 60 Units into the skin at bedtime.        . lansoprazole (PREVACID) 30 MG capsule Take 30 mg by mouth daily.        Marland Kitchen levothyroxine (SYNTHROID, LEVOTHROID) 88 MCG tablet Take 88 mcg by mouth daily.      Marland Kitchen LORazepam (ATIVAN) 1 MG tablet Take 1 mg by mouth 3 (three) times daily.       . metFORMIN (GLUCOPHAGE) 850 MG tablet Take 850 mg by mouth 2 (two) times daily with a meal.      . metoprolol tartrate (LOPRESSOR) 25 MG tablet Take 25 mg by mouth 2 (two) times daily.      . naproxen (NAPROSYN) 250 MG tablet Take 1 tablet (250 mg total) by mouth 2 (two) times daily with a meal.      . tolterodine (DETROL LA) 4 MG 24 hr capsule Take 4 mg by mouth daily.        Marland Kitchen  pravastatin (PRAVACHOL) 20 MG tablet Take 20 mg by mouth daily.      Marland Kitchen DISCONTD: Gabapentin (NEURONTIN PO) Take by mouth. New medicine, pt doesn't remember dosage      . DISCONTD: metoprolol tartrate (LOPRESSOR) 25 MG tablet Take 1 tablet (25 mg total) by mouth 2 (two) times daily.  30 tablet  0    Allergies  ZOX:[id number] acid+aspartame; Simcor; and Amoxicillin-pot clavulanate  Electrocardiogram:  Assessment and Plan

## 2011-06-09 NOTE — Patient Instructions (Signed)
Your physician wants you to follow-up in:  6 MONTHS WITH DR NISHAN  You will receive a reminder letter in the mail two months in advance. If you don't receive a letter, please call our office to schedule the follow-up appointment. Your physician recommends that you continue on your current medications as directed. Please refer to the Current Medication list given to you today. 

## 2011-06-09 NOTE — Assessment & Plan Note (Signed)
Discussed low carb diet.  Target hemoglobin A1c is 6.5 or less.  Continue current medications.  

## 2011-06-09 NOTE — Assessment & Plan Note (Signed)
Stable no critical disease by recent cath.  ASA

## 2011-07-27 ENCOUNTER — Other Ambulatory Visit: Payer: Self-pay | Admitting: *Deleted

## 2011-07-27 DIAGNOSIS — I70219 Atherosclerosis of native arteries of extremities with intermittent claudication, unspecified extremity: Secondary | ICD-10-CM

## 2011-07-27 DIAGNOSIS — Z48812 Encounter for surgical aftercare following surgery on the circulatory system: Secondary | ICD-10-CM

## 2011-07-29 ENCOUNTER — Encounter: Payer: Self-pay | Admitting: Neurosurgery

## 2011-07-30 ENCOUNTER — Encounter: Payer: Self-pay | Admitting: Neurosurgery

## 2011-07-30 ENCOUNTER — Encounter: Payer: Self-pay | Admitting: *Deleted

## 2011-07-30 ENCOUNTER — Other Ambulatory Visit: Payer: Self-pay | Admitting: *Deleted

## 2011-07-30 ENCOUNTER — Encounter (INDEPENDENT_AMBULATORY_CARE_PROVIDER_SITE_OTHER): Payer: Medicare Other | Admitting: *Deleted

## 2011-07-30 ENCOUNTER — Ambulatory Visit (INDEPENDENT_AMBULATORY_CARE_PROVIDER_SITE_OTHER): Payer: Medicare Other | Admitting: Neurosurgery

## 2011-07-30 VITALS — BP 121/75 | HR 73 | Resp 14 | Ht 61.0 in | Wt 148.8 lb

## 2011-07-30 DIAGNOSIS — I70219 Atherosclerosis of native arteries of extremities with intermittent claudication, unspecified extremity: Secondary | ICD-10-CM

## 2011-07-30 DIAGNOSIS — Z48812 Encounter for surgical aftercare following surgery on the circulatory system: Secondary | ICD-10-CM

## 2011-07-30 DIAGNOSIS — I739 Peripheral vascular disease, unspecified: Secondary | ICD-10-CM

## 2011-07-30 NOTE — Progress Notes (Addendum)
[facility] HISTORY AND PHYSICAL   CC: Six-month followup for PAD Referring Physician: Fields  History of Present Illness: 66-year-old female patient of Dr. Fields seen for lower extremity ABIs status post a occluded left femoral popliteal bypass done in April 2012. Patient has continued complaints of claudication that have somewhat worsened since she was seen last. The patient has wavered whether or not to have any further intervention. After some discussion today she has decided to speak with Dr. Fields regarding her claudication. Patient denies rest pain and has no open wounds.  Past Medical History  Diagnosis Date  . Hyperlipidemia   . Arthritis   . Depression   . Hypothyroidism   . GERD (gastroesophageal reflux disease)   . Anxiety   . Diabetes mellitus age 42    insulin dependent  . Peripheral vascular disease   . Lumbar disc disease   . Personal history of malignant neoplasm of kidney 12/14/2008    Laparoscopic biopsy and cryoablation 7/08 Dr. Dalstedt   . Insulin dependent type 2 diabetes mellitus, controlled   . COPD 12/14/2008  . GERD 07/20/2006    ROS: [x] Positive   [ ] Denies    General: [ ] Weight loss, [ ] Fever, [ ] chills Neurologic: [ ] Dizziness, [ ] Blackouts, [ ] Seizure [ ] Stroke, [ ] "Mini stroke", [ ] Slurred speech, [ ] Temporary blindness; [ ] weakness in arms or legs, [ ] Hoarseness Cardiac: [ ] Chest pain/pressure, [ ] Shortness of breath at rest [ ] Shortness of breath with exertion, [ ] Atrial fibrillation or irregular heartbeat Vascular: [ x] Pain in legs with walking, [x ] Pain in legs at rest, [ ] Pain in legs at night,  [ ] Non-healing ulcer, [x ] Blood clot in vein/DVT,   Pulmonary: [ ] Home oxygen, [ ] Productive cough, [ ] Coughing up blood, [ ] Asthma,  [ ] Wheezing Musculoskeletal:  [ ] Arthritis, [ ] Low back pain, [ ] Joint pain Hematologic: [ ] Easy Bruising, [ ] Anemia; [ ] Hepatitis Gastrointestinal: [ ]  Blood in stool, [ ] Gastroesophageal Reflux/heartburn, [ ] Trouble swallowing Urinary: [ ] chronic Kidney disease, [ ] on HD - [ ] MWF or [ ] TTHS, [ ] Burning with urination, [ ] Difficulty urinating Skin: [ ] Rashes, [ ] Wounds Psychological: [ ] Anxiety, [ ] Depression   Social History History  Substance Use Topics  . Smoking status: Current Everyday Smoker -- 0.5 packs/day for 50 years    Types: Cigarettes  . Smokeless tobacco: Not on file  . Alcohol Use: No    Family History Family History  Problem Relation Age of Onset  . Heart disease Mother   . Heart disease Father   . Heart disease Sister   . Lung cancer Mother   . Lung cancer Father     Allergies  Allergen Reactions  . Niacin-Simvastatin Er Hives  . Amoxicillin-Pot Clavulanate Rash    Current Outpatient Prescriptions  Medication Sig Dispense Refill  . amitriptyline (ELAVIL) 75 MG tablet Take 75 mg by mouth at bedtime.      . clopidogrel (PLAVIX) 75 MG tablet Take 75 mg by mouth daily.        . cyclobenzaprine (FLEXERIL) 5 MG tablet Take 5 mg by mouth 2 (two) times daily as needed. For muscle spasms      . insulin aspart (NOVOLOG) 100 UNIT/ML injection Inject 4-6 Units   into the skin 3 (three) times daily before meals.        . insulin glargine (LANTUS) 100 UNIT/ML injection Inject 60 Units into the skin at bedtime.        . lansoprazole (PREVACID) 30 MG capsule Take 30 mg by mouth daily.        . levothyroxine (SYNTHROID, LEVOTHROID) 88 MCG tablet Take 88 mcg by mouth daily.      . metFORMIN (GLUCOPHAGE) 850 MG tablet Take 850 mg by mouth 2 (two) times daily with a meal.      . metoprolol tartrate (LOPRESSOR) 25 MG tablet Take 25 mg by mouth 2 (two) times daily.      . naproxen (NAPROSYN) 250 MG tablet Take 1 tablet (250 mg total) by mouth 2 (two) times daily with a meal.      . Naproxen Sodium (ALEVE) 220 MG CAPS Take 200 mg by mouth as needed.      . tolterodine (DETROL LA) 4 MG 24 hr capsule Take 4 mg by mouth  daily.        . HYDROcodone-acetaminophen (NORCO) 10-325 MG per tablet Take 1 tablet by mouth every 6 (six) hours as needed.      . LORazepam (ATIVAN) 1 MG tablet Take 1 mg by mouth 3 (three) times daily.       . pravastatin (PRAVACHOL) 20 MG tablet Take 20 mg by mouth daily.      . DISCONTD: Gabapentin (NEURONTIN PO) Take by mouth. New medicine, pt doesn't remember dosage        Physical Examination  Filed Vitals:   07/30/11 1154  BP: 121/75  Pulse: 73  Resp: 14    Body mass index is 28.12 kg/(m^2).  General:  WDWN in NAD Gait: Normal HEENT: WNL Eyes: Pupils equal Pulmonary: normal non-labored breathing , without Rales, rhonchi,  wheezing Cardiac: RRR, without  Murmurs, rubs or gallops; No carotid bruits Abdomen: soft, NT, no masses Skin: no rashes, ulcers noted Vascular Exam/Pulses: Palpable femoral pulses bilaterally, PT and DP pulses are 1+  Extremities without ischemic changes, no Gangrene , no cellulitis; no open wounds;  Musculoskeletal: no muscle wasting or atrophy  Neurologic: A&O X 3; Appropriate Affect ; SENSATION: normal; MOTOR FUNCTION:  moving all extremities equally. Speech is fluent/normal  Non-Invasive Vascular Imaging: ABIs today are 0.8 on the right and 0.86 on the left she is monophasic on the right and monophasic to biphasic on the left  ASSESSMENT/PLAN: Imaging discussed with Dr. Fields who spoke with the patient regarding her claudication. Decision was made to proceed with a angiogram with runoff and possible intervention. This is scheduled for 08/21/2011 with Dr. Fields, followup will be pending that procedure. Dr. Fields gave a prescription for 30 Percocet with no intention to refill this.  Mansi Tokar ANP next  Clinic M.D.: Fields  

## 2011-07-31 ENCOUNTER — Emergency Department (HOSPITAL_COMMUNITY): Payer: Medicare Other

## 2011-07-31 ENCOUNTER — Encounter (HOSPITAL_COMMUNITY): Payer: Self-pay | Admitting: Emergency Medicine

## 2011-07-31 ENCOUNTER — Emergency Department (HOSPITAL_COMMUNITY)
Admission: EM | Admit: 2011-07-31 | Discharge: 2011-07-31 | Disposition: A | Discharge status: Home / Self Care | Payer: Medicare Other | Attending: Emergency Medicine | Admitting: Emergency Medicine

## 2011-07-31 DIAGNOSIS — F17200 Nicotine dependence, unspecified, uncomplicated: Secondary | ICD-10-CM | POA: Insufficient documentation

## 2011-07-31 DIAGNOSIS — E785 Hyperlipidemia, unspecified: Secondary | ICD-10-CM | POA: Insufficient documentation

## 2011-07-31 DIAGNOSIS — R5381 Other malaise: Secondary | ICD-10-CM | POA: Insufficient documentation

## 2011-07-31 DIAGNOSIS — R079 Chest pain, unspecified: Secondary | ICD-10-CM | POA: Insufficient documentation

## 2011-07-31 DIAGNOSIS — K219 Gastro-esophageal reflux disease without esophagitis: Secondary | ICD-10-CM | POA: Insufficient documentation

## 2011-07-31 DIAGNOSIS — F329 Major depressive disorder, single episode, unspecified: Secondary | ICD-10-CM | POA: Insufficient documentation

## 2011-07-31 DIAGNOSIS — R531 Weakness: Secondary | ICD-10-CM

## 2011-07-31 DIAGNOSIS — F411 Generalized anxiety disorder: Secondary | ICD-10-CM | POA: Insufficient documentation

## 2011-07-31 DIAGNOSIS — Z794 Long term (current) use of insulin: Secondary | ICD-10-CM | POA: Insufficient documentation

## 2011-07-31 DIAGNOSIS — M129 Arthropathy, unspecified: Secondary | ICD-10-CM | POA: Insufficient documentation

## 2011-07-31 DIAGNOSIS — Z79899 Other long term (current) drug therapy: Secondary | ICD-10-CM | POA: Insufficient documentation

## 2011-07-31 DIAGNOSIS — E039 Hypothyroidism, unspecified: Secondary | ICD-10-CM | POA: Insufficient documentation

## 2011-07-31 DIAGNOSIS — R42 Dizziness and giddiness: Secondary | ICD-10-CM | POA: Insufficient documentation

## 2011-07-31 DIAGNOSIS — F3289 Other specified depressive episodes: Secondary | ICD-10-CM | POA: Insufficient documentation

## 2011-07-31 LAB — DIFFERENTIAL
Basophils Relative: 1 % (ref 0–1)
Lymphs Abs: 3.2 10*3/uL (ref 0.7–4.0)
Monocytes Relative: 5 % (ref 3–12)
Neutro Abs: 4.5 10*3/uL (ref 1.7–7.7)
Neutrophils Relative %: 55 % (ref 43–77)

## 2011-07-31 LAB — COMPREHENSIVE METABOLIC PANEL
ALT: 8 U/L (ref 0–35)
Albumin: 3.6 g/dL (ref 3.5–5.2)
Alkaline Phosphatase: 76 U/L (ref 39–117)
BUN: 15 mg/dL (ref 6–23)
Chloride: 100 mEq/L (ref 96–112)
Glucose, Bld: 161 mg/dL — ABNORMAL HIGH (ref 70–99)
Potassium: 4 mEq/L (ref 3.5–5.1)
Total Bilirubin: 0.1 mg/dL — ABNORMAL LOW (ref 0.3–1.2)

## 2011-07-31 LAB — CBC
Hemoglobin: 12.3 g/dL (ref 12.0–15.0)
RBC: 4.49 MIL/uL (ref 3.87–5.11)

## 2011-07-31 LAB — TROPONIN I: Troponin I: <0.3 ng/mL (ref <0.30)

## 2011-07-31 MED ORDER — HYDROCODONE-ACETAMINOPHEN 5-325 MG PO TABS: ORAL_TABLET | ORAL | Status: DC

## 2011-07-31 NOTE — ED Notes (Signed)
Received report on pt. Pt to MRI before coming to CDU

## 2011-07-31 NOTE — ED Notes (Signed)
Per EMS: pt from home c/o generalized weakness starting today; pt unable to answer some questions but could answer others; pt c/o epigastric pain starting in route; pt given 324mg  ASA and 1 SL nitro

## 2011-07-31 NOTE — ED Provider Notes (Signed)
History     CSN: [id number]  Arrival date & time 07/31/11  1445   First MD Initiated Contact with Patient 07/31/11 1459      Chief Complaint  Patient presents with  . Weakness  . Chest Pain    (Consider location/radiation/quality/duration/timing/severity/associated sxs/prior treatment) HPI  Past Medical History  Diagnosis Date  . Hyperlipidemia   . Arthritis   . Depression   . Hypothyroidism   . GERD (gastroesophageal reflux disease)   . Anxiety   . Diabetes mellitus age 48    insulin dependent  . Peripheral vascular disease   . Lumbar disc disease   . Personal history of malignant neoplasm of kidney 12/14/2008    Laparoscopic biopsy and cryoablation 7/08 Dr. Normajean Baxter   . Insulin dependent type 2 diabetes mellitus, controlled   . COPD 12/14/2008  . GERD 07/20/2006    Past Surgical History  Procedure Date  . Appendectomy   . Abdominal hysterectomy   . Knee surgery   . Cholecystectomy   . Lung surgery     lung nodule removed from the right side  . Laparascopic cryoablation of left kidney 08/2006    Dr. Sherre Poot for renal cell cancer  . Bladder suspension   . Shoulder surgery   . Foot surgery   . Pr vein bypass graft,aorto-fem-pop 05/27/2010    Family History  Problem Relation Age of Onset  . Heart disease Mother   . Heart disease Father   . Heart disease Sister   . Lung cancer Mother   . Lung cancer Father     History  Substance Use Topics  . Smoking status: Current Everyday Smoker -- 0.5 packs/day for 50 years    Types: Cigarettes  . Smokeless tobacco: Not on file  . Alcohol Use: No    OB History    Grav Para Term Preterm Abortions TAB SAB Ect Mult Living                  Review of Systems  Allergies  Niacin-simvastatin er and Amoxicillin-pot clavulanate  Home Medications   Current Outpatient Rx  Name Route Sig Dispense Refill  . AMITRIPTYLINE HCL 75 MG PO TABS Oral Take 75 mg by mouth at bedtime.    . CLOPIDOGREL BISULFATE 75 MG PO TABS  Oral Take 75 mg by mouth daily.      . CYCLOBENZAPRINE HCL 10 MG PO TABS Oral Take 10 mg by mouth every 8 (eight) hours as needed. For muscle spasms    . DIAZEPAM 10 MG PO TABS Oral Take 10 mg by mouth 3 (three) times daily.    Marland Kitchen FERROUS SULFATE 325 (65 FE) MG PO TABS Oral Take 325 mg by mouth daily with breakfast.    . INSULIN ASPART 100 UNIT/ML [place] SOLN Subcutaneous Inject 4-6 Units into the skin 3 (three) times daily before meals.      . INSULIN GLARGINE 100 UNIT/ML [place] SOLN Subcutaneous Inject 60 Units into the skin at bedtime.      Marland Kitchen LANSOPRAZOLE 30 MG PO CPDR Oral Take 30 mg by mouth daily.      Marland Kitchen LEVOTHYROXINE SODIUM 88 MCG PO TABS Oral Take 88 mcg by mouth daily.    Marland Kitchen MECLIZINE HCL 25 MG PO TABS Oral Take 25 mg by mouth 2 (two) times daily as needed. dizziness    . METFORMIN HCL 850 MG PO TABS Oral Take 850 mg by mouth 2 (two) times daily with a meal.    . METOPROLOL  TARTRATE 25 MG PO TABS Oral Take 25 mg by mouth 2 (two) times daily.    Marland Kitchen PRAVASTATIN SODIUM 20 MG PO TABS Oral Take 20 mg by mouth daily.    Marland Kitchen PROMETHAZINE HCL 25 MG PO TABS Oral Take 25 mg by mouth every 6 (six) hours as needed. For nausea/vomiting    . TOLTERODINE TARTRATE ER 4 MG PO CP24 Oral Take 4 mg by mouth daily.        BP 103/60  Pulse 74  Temp(Src) 98.4 F (36.9 C) (Oral)  Resp 16  SpO2 96%  Physical Exam  ED Course  Procedures (including critical care time)  Labs Reviewed  COMPREHENSIVE METABOLIC PANEL - Abnormal; Notable for the following:    Glucose, Bld 161 (*)    Total Bilirubin 0.1 (*)    GFR calc non Af Amer 60 (*)    GFR calc Af Amer 69 (*)    All other components within normal limits  GLUCOSE, CAPILLARY - Abnormal; Notable for the following:    Glucose-Capillary 153 (*)    All other components within normal limits  CBC  DIFFERENTIAL  TROPONIN I  URINALYSIS, ROUTINE W REFLEX MICROSCOPIC   Mr Brain Wo Contrast  07/31/2011  *RADIOLOGY REPORT*  Clinical Data: History of peripheral vascular  disease, hypertension, and diabetes mellitus, with severe episode of vertigo and slurred speech today.  MRI HEAD WITHOUT CONTRAST  Technique:  Multiplanar, multiecho pulse sequences of the brain and surrounding structures were obtained according to standard protocol without intravenous contrast.  Comparison: 07/05/2007 CT Head  Findings: There is no evidence for acute infarction, intracranial hemorrhage, mass lesion, hydrocephalus, or extra-axial fluid.  Mild atrophy.  Nonspecific subcortical and periventricular white matter signal abnormality, likely chronic microvascular ischemic change. Scattered lacunes.  Scattered prominent perivascular spaces.  No midline shift.  No proximal vascular occlusion.  Normal pituitary and cerebellar tonsils.  Negative orbits.  Chronic sinus disease right ethmoid region.  Previous antrectomy right maxillary sinus. Little change from prior CT.  IMPRESSION: Chronic changes as described.  No acute stroke or bleed.  Chronic sinus disease.  Original Report Authenticated By: Elsie Stain, M.D.   Dg Chest Portable 1 View  07/31/2011  *RADIOLOGY REPORT*  Clinical Data: Weakness and chest pain. Shortness of breath. Smoker.  PORTABLE CHEST - 1 VIEW  Comparison: 05/09/2011 CT and chest x-ray.  Findings: Minimal patchiness right  perihilar region.  It is possible this represents atelectasis although subtle infiltrate or other abnormality not excluded.  This can be evaluated on follow-up two-view chest with cardiac leads remain.  Heart size top normal.  Mild central pulmonary vessel prominence. Minimally tortuous calcified aorta.  IMPRESSION: Minimal patchiness right  perihilar region.  It is possible this represents atelectasis although subtle infiltrate or other abnormality not excluded.  This can be evaluated on follow-up two- view chest with cardiac leads remain.  Please see above.  Original Report Authenticated By: Fuller Canada, M.D.    1. Generalized weakness   2. Vertigo   3.  Chest pain     4:16 PM Patient to CDU from the blue side of the emergency department. Handoff from Dr. Bebe Shaggy. Patient with complaints of generalized weakness and vertigo. She has also had some vague chest pain. Patient to CDU pending MRI brain to rule out posterior circulation stroke and also troponin to evaluate for chest pain.   Vital signs reviewed and are as follows: Filed Vitals:   07/31/11 1459  BP: 103/60  Pulse:  74  Temp: 98.4 F (36.9 C)  Resp: 16   Exam:  Gen NAD; Heart RRR, nml S1,S2, no m/r/g; Lungs CTAB; Abd soft, NT, no rebound or guarding; Ext 2+ pedal pulses bilaterally, no edema; Neuro CN III-XII grossly intact.   Patient informed of MRI findings and troponin results. Patient ambulated hallway without difficulty. She was urged to followup with primary care physician this week and return with worsening symptoms or she has any other concerns. Patient verbalizes understanding and agrees with the plan.  MDM  Work-up does not show stroke or indication of heart muscle damage. Patient had left CP, not in location of right perihilar atelectasis/infiltrate. She also has no cough, fever, elevated WBC. Will not treat as PNA.         Renne Crigler, [place] 07/31/11 1851

## 2011-07-31 NOTE — ED Notes (Signed)
Report given to CDU RN.

## 2011-07-31 NOTE — ED Provider Notes (Signed)
History     CSN: [id number]  Arrival date & time 07/31/11  1445   First MD Initiated Contact with Patient 07/31/11 1459      Chief Complaint  Patient presents with  . Weakness  . Chest Pain    (Consider location/radiation/quality/duration/timing/severity/associated sxs/prior treatment) Patient is a 66 y.o. female presenting with weakness and chest pain. The history is provided by the patient.  Weakness The primary symptoms include dizziness (sensation of her spinning around the room) and nausea. Primary symptoms do not include headaches, syncope, loss of consciousness, altered mental status, seizures, visual change, paresthesias, focal weakness, loss of sensation, speech change, memory loss, fever or vomiting. The symptoms began 1 to 2 hours ago (2 hrs ago). The symptoms are unchanged. The neurological symptoms are diffuse. Context: at rest.  Dizziness also occurs with nausea and weakness. Dizziness does not occur with tinnitus or vomiting.   Additional symptoms include weakness, loss of balance, vertigo and anxiety. Additional symptoms do not include neck stiffness, pain, lower back pain, leg pain, aura, hallucinations, hearing loss or tinnitus. Medical issues also include diabetes. Medical issues do not include seizures, cerebral vascular accident or cancer.  Chest Pain Primary symptoms include nausea and dizziness (sensation of her spinning around the room). Pertinent negatives for primary symptoms include no fever, no syncope, no shortness of breath, no cough, no wheezing, no abdominal pain, no vomiting and no altered mental status.  Dizziness also occurs with nausea and weakness. Dizziness does not occur with tinnitus or vomiting.   Associated symptoms include weakness.  Pertinent negatives for associated symptoms include no numbness.  Her past medical history is significant for diabetes.  Pertinent negatives for past medical history include no cancer and no seizures.     Past  Medical History  Diagnosis Date  . Hyperlipidemia   . Arthritis   . Depression   . Hypothyroidism   . GERD (gastroesophageal reflux disease)   . Anxiety   . Diabetes mellitus age 50    insulin dependent  . Peripheral vascular disease   . Lumbar disc disease   . Personal history of malignant neoplasm of kidney 12/14/2008    Laparoscopic biopsy and cryoablation 7/08 Dr. Normajean Baxter   . Insulin dependent type 2 diabetes mellitus, controlled   . COPD 12/14/2008  . GERD 07/20/2006    Past Surgical History  Procedure Date  . Appendectomy   . Abdominal hysterectomy   . Knee surgery   . Cholecystectomy   . Lung surgery     lung nodule removed from the right side  . Laparascopic cryoablation of left kidney 08/2006    Dr. Sherre Poot for renal cell cancer  . Bladder suspension   . Shoulder surgery   . Foot surgery   . Pr vein bypass graft,aorto-fem-pop 05/27/2010    Family History  Problem Relation Age of Onset  . Heart disease Mother   . Heart disease Father   . Heart disease Sister   . Lung cancer Mother   . Lung cancer Father     History  Substance Use Topics  . Smoking status: Current Everyday Smoker -- 0.5 packs/day for 50 years    Types: Cigarettes  . Smokeless tobacco: Not on file  . Alcohol Use: No    OB History    Grav Para Term Preterm Abortions TAB SAB Ect Mult Living                  Review of Systems  Constitutional: Negative for fever,  chills, activity change and appetite change.  HENT: Negative for hearing loss, neck stiffness and tinnitus.   Respiratory: Negative for cough, chest tightness, shortness of breath and wheezing.   Cardiovascular: Positive for chest pain. Negative for syncope.  Gastrointestinal: Positive for nausea. Negative for vomiting, abdominal pain, diarrhea and constipation.  Genitourinary: Negative for dysuria and frequency.  Skin: Negative for rash and wound.  Neurological: Positive for dizziness (sensation of her spinning around the  room), vertigo, speech difficulty, weakness and loss of balance. Negative for speech change, focal weakness, seizures, loss of consciousness, syncope, light-headedness, numbness, headaches and paresthesias.  Psychiatric/Behavioral: Negative for hallucinations, memory loss, confusion, agitation and altered mental status.  All other systems reviewed and are negative.    Allergies  Niacin-simvastatin er and Amoxicillin-pot clavulanate  Home Medications   Current Outpatient Rx  Name Route Sig Dispense Refill  . AMITRIPTYLINE HCL 75 MG PO TABS Oral Take 75 mg by mouth at bedtime.    . CLOPIDOGREL BISULFATE 75 MG PO TABS Oral Take 75 mg by mouth daily.      Marland Kitchen HYDROCODONE-ACETAMINOPHEN 10-325 MG PO TABS Oral Take 1 tablet by mouth every 6 (six) hours as needed.    . INSULIN ASPART 100 UNIT/ML [place] SOLN Subcutaneous Inject 4-6 Units into the skin 3 (three) times daily before meals.      . INSULIN GLARGINE 100 UNIT/ML [place] SOLN Subcutaneous Inject 60 Units into the skin at bedtime.      Marland Kitchen LANSOPRAZOLE 30 MG PO CPDR Oral Take 30 mg by mouth daily.      Marland Kitchen LEVOTHYROXINE SODIUM 88 MCG PO TABS Oral Take 88 mcg by mouth daily.    Marland Kitchen LORAZEPAM 1 MG PO TABS Oral Take 1 mg by mouth 3 (three) times daily.     Marland Kitchen METFORMIN HCL 850 MG PO TABS Oral Take 850 mg by mouth 2 (two) times daily with a meal.    . METOPROLOL TARTRATE 25 MG PO TABS Oral Take 25 mg by mouth 2 (two) times daily.    Marland Kitchen NAPROXEN 250 MG PO TABS Oral Take 1 tablet (250 mg total) by mouth 2 (two) times daily with a meal.      Please try for 3-4 days only.  Marland Kitchen NAPROXEN SODIUM 220 MG PO CAPS Oral Take 200 mg by mouth as needed.    Marland Kitchen PRAVASTATIN SODIUM 20 MG PO TABS Oral Take 20 mg by mouth daily.    . TOLTERODINE TARTRATE ER 4 MG PO CP24 Oral Take 4 mg by mouth daily.        BP 103/60  Pulse 74  Temp(Src) 98.4 F (36.9 C) (Oral)  Resp 16  SpO2 96%  Physical Exam  Nursing note and vitals reviewed. Constitutional: She appears well-developed  and well-nourished.  HENT:  Head: Normocephalic and atraumatic.  Right Ear: External ear normal.  Left Ear: External ear normal.  Nose: Nose normal.  Mouth/Throat: Oropharynx is clear and moist. No oropharyngeal exudate.  Eyes: Conjunctivae are normal. Pupils are equal, round, and reactive to light.  Neck: Normal range of motion. Neck supple.  Cardiovascular: Normal rate, regular rhythm, normal heart sounds and intact distal pulses.  Exam reveals no gallop and no friction rub.   No murmur heard. Abdominal: Soft. Bowel sounds are normal. She exhibits no distension and no mass. There is no tenderness. There is no rebound and no guarding.  Musculoskeletal: Normal range of motion. She exhibits no edema and no tenderness.  Neurological: She is alert. She  displays normal reflexes. No cranial nerve deficit. She exhibits normal muscle tone. Coordination abnormal.  Skin: Skin is warm and dry.  Psychiatric: She has a normal mood and affect. Her behavior is normal. Judgment and thought content normal.    ED Course  Procedures (including critical care time)  Labs Reviewed  COMPREHENSIVE METABOLIC PANEL - Abnormal; Notable for the following:    Glucose, Bld 161 (*)    Total Bilirubin 0.1 (*)    GFR calc non Af Amer 60 (*)    GFR calc Af Amer 69 (*)    All other components within normal limits  GLUCOSE, CAPILLARY - Abnormal; Notable for the following:    Glucose-Capillary 153 (*)    All other components within normal limits  CBC  DIFFERENTIAL  TROPONIN I  URINALYSIS, ROUTINE W REFLEX MICROSCOPIC   Dg Chest Portable 1 View  07/31/2011  *RADIOLOGY REPORT*  Clinical Data: Weakness and chest pain. Shortness of breath. Smoker.  PORTABLE CHEST - 1 VIEW  Comparison: 05/09/2011 CT and chest x-ray.  Findings: Minimal patchiness right  perihilar region.  It is possible this represents atelectasis although subtle infiltrate or other abnormality not excluded.  This can be evaluated on follow-up two-view  chest with cardiac leads remain.  Heart size top normal.  Mild central pulmonary vessel prominence. Minimally tortuous calcified aorta.  IMPRESSION: Minimal patchiness right  perihilar region.  It is possible this represents atelectasis although subtle infiltrate or other abnormality not excluded.  This can be evaluated on follow-up two- view chest with cardiac leads remain.  Please see above.  Original Report Authenticated By: Fuller Canada, M.D.     1. Generalized weakness   2. Vertigo   3. Chest pain     Date: 07/31/2011  Rate: 72  Rhythm: normal sinus rhythm  QRS Axis: normal  Intervals: normal  ST/T Wave abnormalities: normal  Conduction Disutrbances:none  Narrative Interpretation:   Old EKG Reviewed: unchanged  MDM  66 yo F presents for several hrs of abrupt onset of generalized weakness and chest pain, reproducible with movement. No associated dyspnea and clinical picture not concerning for PE. CXR not c/w dissection, PTX, or PNA. Pt endorses sensation of vertigo, which is also reproducible with movement (Dix-Hallpike Maneuver). EKG not c/w acute ischemia or arrythmia. 325mg  ASA given; no improvement of chest pain with SL NTG. Labs not c/w electrolyte abnormalities, anemia, acute renal failure, or hypoglycemia as cause of symptoms. Low likelihood of ACS, but will obtain serial troponins. Reproducibility of symptoms with position and abrupt onset also indicate low likelihood of CVA; however, will rule out posterior CVA with brain MRI. While awaiting labs and imaging, care transferred to ED CDU, who is apprised of course and plan.           Clemetine Marker, MD 07/31/11 1651

## 2011-07-31 NOTE — ED Notes (Signed)
Patient transported to MRI 

## 2011-07-31 NOTE — ED Notes (Signed)
Patient ambulated 100 feet. Gait steady. Tolerated well.

## 2011-07-31 NOTE — ED Notes (Signed)
Patient C/O having a headache but denies chest pain at this time.  States that she is ready to go home.

## 2011-07-31 NOTE — Discharge Instructions (Signed)
Please read and follow all provided instructions.  Your diagnoses today include:  1. Generalized weakness   2. Vertigo   3. Chest pain     Tests performed today include:  Blood tests  Cardiac markers which do not show heart muscle damage  Chest x-ray  MRI of your brain which does not show a stroke or other cause of your symptoms  Vital signs. See below for your results today.   Medications prescribed:   Vicodin (hydrocodone/acetaminophen) - narcotic pain medication  You have been prescribed narcotic pain medication such as Vicodin or Percocet: DO NOT drive or perform any activities that require you to be awake and alert because this medicine can make you drowsy. BE VERY CAREFUL not to take multiple medicines containing Tylenol (also called acetaminophen). Doing so can lead to an overdose which can damage your liver and cause liver failure and possibly death.   Take any prescribed medications only as directed.  Home care instructions:  Follow any educational materials contained in this packet.  BE VERY CAREFUL not to take multiple medicines containing Tylenol (also called acetaminophen). Doing so can lead to an overdose which can damage your liver and cause liver failure and possibly death.   Follow-up instructions: Please follow-up with your primary care provider in the next several days for further evaluation of your symptoms. If you do not have a primary care doctor -- see below for referral information.   Return instructions:   Please return to the Emergency Department if you experience worsening symptoms.   Please return if you have any other emergent concerns.  Additional Information:  Your vital signs today were: BP 127/68  Pulse 69  Temp(Src) 98.4 F (36.9 C) (Oral)  Resp 18  SpO2 100% If your blood pressure (BP) was elevated above 135/85 this visit, please have this repeated by your doctor within one month. -------------- No Primary Care Doctor Call  [facility]  [phone] Other agencies that provide inexpensive medical care    [facility]  [phone]    [facility]  [phone]    [facility]  [phone]    [facility]  [phone]    [facility]  [phone]    [facility]  [phone] -------------- RESOURCE GUIDE:  Dental Problems  Patients with Medicaid: [facility] [phone] [address]                                            [phone] [address] Phone:  [phone]                                                   Phone:  [phone]  If unable to pay or uninsured, contact:  [facility] or [facility]. to become qualified for the adult dental clinic.  Chronic Pain Problems Contact [facility]  [phone] Patients need to be referred by their primary care doctor.  Insufficient Money for Medicine Contact [employer]:  call "211" or [facility] [phone].  Psychological Services [employer] [facility]  [phone]  [employer] [facility]  [phone] [facility]   [phone] (emergency services [phone])  Substance Abuse Resources [facility]  [phone] [facility] [phone] [facility] [place] [phone] Floydene Flock [phone] Residential & Outpatient Substance Abuse Program  [phone]  Abuse/Neglect [facility] Child Abuse Hotline 443 524 9144 [facility] Child Abuse Hotline [phone] (After Hours)  Emergency Shelter [facility] [phone]  Maternity Homes [facility] [place] [facility] [phone] [place] [facility] [phone]  [facility] Resources  [facility] [place] [facility]     [employer]                          [facility] Dept. [address] [place]                       [address]      [address]  Blondell Reveal Phone:  [phone]                                   Phone:  [phone]                 Phone:  [phone]  [facility] Phone:  [phone]  [facility] Child Abuse Hotline (949)162-3124 [phone] (After Hours)

## 2011-08-03 NOTE — ED Provider Notes (Signed)
Medical screening examination/treatment/procedure(s) were conducted as a shared visit with non-physician practitioner(s) and myself.  I personally evaluated the patient during the encounter   Joya Gaskins, MD 08/03/11 [phone]

## 2011-08-03 NOTE — ED Provider Notes (Signed)
I have personally seen and examined the patient.  I have discussed the plan of care with the resident.  I have reviewed the documentation on PMH/FH/Soc. History.  I have reviewed the documentation of the resident and agree.  I have reviewed and agree with the ECG interpretation(s) documented by the resident  We felt mri reasonable to r/o CVA.  Pt improved while in ED.  Do not feel admission/further testing warranted for TIA    Joya Gaskins, MD 08/03/11 1506

## 2011-08-06 ENCOUNTER — Encounter (HOSPITAL_COMMUNITY): Payer: Self-pay | Admitting: *Deleted

## 2011-08-06 ENCOUNTER — Emergency Department (INDEPENDENT_AMBULATORY_CARE_PROVIDER_SITE_OTHER)
Admission: EM | Admit: 2011-08-06 | Discharge: 2011-08-06 | Disposition: A | Discharge status: Home / Self Care | Payer: Medicare Other | Source: Home / Self Care | Attending: Emergency Medicine | Admitting: Emergency Medicine

## 2011-08-06 ENCOUNTER — Emergency Department (INDEPENDENT_AMBULATORY_CARE_PROVIDER_SITE_OTHER): Payer: Medicare Other

## 2011-08-06 DIAGNOSIS — M25551 Pain in right hip: Secondary | ICD-10-CM

## 2011-08-06 DIAGNOSIS — IMO0002 Reserved for concepts with insufficient information to code with codable children: Secondary | ICD-10-CM

## 2011-08-06 DIAGNOSIS — S73109A Unspecified sprain of unspecified hip, initial encounter: Secondary | ICD-10-CM

## 2011-08-06 DIAGNOSIS — M169 Osteoarthritis of hip, unspecified: Secondary | ICD-10-CM

## 2011-08-06 DIAGNOSIS — M25559 Pain in unspecified hip: Secondary | ICD-10-CM

## 2011-08-06 HISTORY — DX: Dizziness and giddiness: R42

## 2011-08-06 HISTORY — DX: Personal history of transient ischemic attack (TIA), and cerebral infarction without residual deficits: Z86.73

## 2011-08-06 HISTORY — DX: Atherosclerotic heart disease of native coronary artery without angina pectoris: I25.10

## 2011-08-06 MED ORDER — MELOXICAM 7.5 MG PO TABS: 7.5000 mg | ORAL_TABLET | Freq: Every day | ORAL | Status: DC

## 2011-08-06 NOTE — Discharge Instructions (Signed)
  We discussed your x-ray results. We have sent a prescription of meloxicam to your pharmacy for you to take for the next 2 weeks. If pain persist or you are unable to walk on your hip you should follow-up with the orthopedic Dr. Bonita Quin could also go back to your primary care Dr [facility]). so they can refer you for physical therapy if your pain persists beyond 10-14 days.    Degenerative Arthritis You have osteoarthritis. This is the wear and tear arthritis that comes with aging. It is also called degenerative arthritis. This is common in people past middle age. It is caused by stress on the joints. The large weight bearing joints of the lower extremities are most often affected. The knees, hips, back, neck, and hands can become painful, swollen, and stiff. This is the most common type of arthritis. It comes on with age, carrying too much weight, or from an injury. Treatment includes resting the sore joint until the pain and swelling improve. Crutches or a walker may be needed for severe flares. Only take over-the-counter or prescription medicines for pain, discomfort, or fever as directed by your caregiver. Local heat therapy may improve motion. Cortisone shots into the joint are sometimes used to reduce pain and swelling during flares. Osteoarthritis is usually not crippling and progresses slowly. There are things you can do to decrease pain:  Avoid high impact activities.   Exercise regularly.   Low impact exercises such as walking, biking and swimming help to keep the muscles strong and keep normal joint function.   Stretching helps to keep your range of motion.   Lose weight if you are overweight. This reduces joint stress.  In severe cases when you have pain at rest or increasing disability, joint surgery may be helpful. See your caregiver for follow-up treatment as recommended.  SEEK IMMEDIATE MEDICAL CARE IF:   You have severe joint pain.   Marked swelling and redness in your joint  develops.   You develop a high fever.  Document Released: 02/09/2005 Document Revised: 01/29/2011 Document Reviewed: 07/12/2006 [facility] Patient Information 2012 [place], [place].

## 2011-08-06 NOTE — ED Notes (Signed)
Pt reports falling onto sidewalk hitting left hip one week ago - was evaluated [facility] (dx with vertigo & tia then discharged - hip not bothering pt at that time). Has tried otc meds with no relief. Ambulatory into treatment area without assistance

## 2011-08-06 NOTE — ED Provider Notes (Signed)
History     CSN: [id number]  Arrival date & time 08/06/11  1052   First MD Initiated Contact with Patient 08/06/11 1109      Chief Complaint  Patient presents with  . Hip Pain    (Consider location/radiation/quality/duration/timing/severity/associated sxs/prior treatment) HPI Comments: Patient presents to urgent care today complaining of right hip pain, she describes that she fell onto a sidewalk hitting her left hip about a week ago was seen in the emergency department but she was not having problems a time. For the last few days almost a week she had been noticing progressive right hip pain that is worse when she walks and sits for long periods of time. Patient denies any paresthesias, numbness or distal weakness. Patient also denies any constitutional symptoms such as fevers, unintentional weight loss or fatigue or generalized malaise.  Patient describes that she has had bursitis of her hip in the past and this feels very similar problems to make sure she "did not break a bone".  Patient is a 66 y.o. female presenting with hip pain. The history is provided by the patient.  Hip Pain This is a new problem. The current episode started more than 1 week ago. The problem occurs constantly. The problem has not changed since onset.Pertinent negatives include no abdominal pain. The symptoms are aggravated by bending and walking. The symptoms are relieved by rest.    Past Medical History  Diagnosis Date  . Hyperlipidemia   . Arthritis   . Depression   . Hypothyroidism   . GERD (gastroesophageal reflux disease)   . Anxiety   . Diabetes mellitus age 69    insulin dependent  . Peripheral vascular disease   . Lumbar disc disease   . Personal history of malignant neoplasm of kidney 12/14/2008    Laparoscopic biopsy and cryoablation 7/08 Dr. Normajean Baxter   . Insulin dependent type 2 diabetes mellitus, controlled   . COPD 12/14/2008  . GERD 07/20/2006  . CAD (coronary artery disease)   . CAD  (coronary artery disease) of bypass graft   . Vertigo   . History of transient ischemic attack (TIA)     Past Surgical History  Procedure Date  . Appendectomy   . Abdominal hysterectomy   . Knee surgery   . Cholecystectomy   . Lung surgery     lung nodule removed from the right side  . Laparascopic cryoablation of left kidney 08/2006    Dr. Sherre Poot for renal cell cancer  . Bladder suspension   . Shoulder surgery   . Foot surgery   . Pr vein bypass graft,aorto-fem-pop 05/27/2010    Family History  Problem Relation Age of Onset  . Heart disease Mother   . Heart disease Father   . Heart disease Sister   . Lung cancer Mother   . Lung cancer Father     History  Substance Use Topics  . Smoking status: Current Everyday Smoker -- 1.0 packs/day for 50 years    Types: Cigarettes  . Smokeless tobacco: Not on file  . Alcohol Use: No    OB History    Grav Para Term Preterm Abortions TAB SAB Ect Mult Living                  Review of Systems  Constitutional: Negative for fever, activity change and appetite change.  Gastrointestinal: Negative for abdominal pain.  Musculoskeletal: Positive for gait problem.  Skin: Negative.  Negative for pallor and wound.  Neurological: Negative for  weakness and numbness.    Allergies  Niacin-simvastatin er and Amoxicillin-pot clavulanate  Home Medications   Current Outpatient Rx  Name Route Sig Dispense Refill  . AMITRIPTYLINE HCL 75 MG PO TABS Oral Take 75 mg by mouth at bedtime.    . CLOPIDOGREL BISULFATE 75 MG PO TABS Oral Take 75 mg by mouth daily.      . CYCLOBENZAPRINE HCL 10 MG PO TABS Oral Take 10 mg by mouth every 8 (eight) hours as needed. For muscle spasms    . DIAZEPAM 10 MG PO TABS Oral Take 10 mg by mouth 3 (three) times daily.    Marland Kitchen FERROUS SULFATE 325 (65 FE) MG PO TABS Oral Take 325 mg by mouth daily with breakfast.    . HYDROCODONE-ACETAMINOPHEN 5-325 MG PO TABS  Take 1-2 tablets every 6 hours as needed for severe pain  10 tablet 0  . INSULIN ASPART 100 UNIT/ML [place] SOLN Subcutaneous Inject 4-6 Units into the skin 3 (three) times daily before meals.      . INSULIN GLARGINE 100 UNIT/ML [place] SOLN Subcutaneous Inject 60 Units into the skin at bedtime.      Marland Kitchen LANSOPRAZOLE 30 MG PO CPDR Oral Take 30 mg by mouth daily.      Marland Kitchen LEVOTHYROXINE SODIUM 88 MCG PO TABS Oral Take 88 mcg by mouth daily.    Marland Kitchen MECLIZINE HCL 25 MG PO TABS Oral Take 25 mg by mouth 2 (two) times daily as needed. dizziness    . MELOXICAM 7.5 MG PO TABS Oral Take 1 tablet (7.5 mg total) by mouth daily. 14 tablet 0  . METFORMIN HCL 850 MG PO TABS Oral Take 850 mg by mouth 2 (two) times daily with a meal.    . METOPROLOL TARTRATE 25 MG PO TABS Oral Take 25 mg by mouth 2 (two) times daily.    Marland Kitchen PRAVASTATIN SODIUM 20 MG PO TABS Oral Take 20 mg by mouth daily.    Marland Kitchen PROMETHAZINE HCL 25 MG PO TABS Oral Take 25 mg by mouth every 6 (six) hours as needed. For nausea/vomiting    . TOLTERODINE TARTRATE ER 4 MG PO CP24 Oral Take 4 mg by mouth daily.        BP 134/53  Pulse 78  Temp 98.8 F (37.1 C) (Oral)  Resp 18  SpO2 100%  Physical Exam  Nursing note and vitals reviewed. Constitutional: She is oriented to person, place, and time. No distress.  Musculoskeletal: She exhibits tenderness.       Right hip: She exhibits decreased range of motion, tenderness and bony tenderness. She exhibits normal strength, no swelling, no crepitus, no deformity and no laceration.       Legs: Neurological: She is alert and oriented to person, place, and time.  Skin: Skin is warm. No rash noted. No erythema.    ED Course  Procedures (including critical care time)  Labs Reviewed - No data to display Dg Hip Complete Right  08/06/2011  *RADIOLOGY REPORT*  Clinical Data: Status post fall 8 days ago.  Right hip pain.  RIGHT HIP - COMPLETE 2+ VIEW  Comparison: MRI right hip 04/12/2005.  Findings: Both hips are located.  No fracture is identified. Mild, symmetric appearing  degenerative changes is present about the hips. No plain film evidence of avascular necrosis.  Soft tissue structures unremarkable.  IMPRESSION: No acute finding.  Mild appearing degenerative disease.  Original Report Authenticated By: Bernadene Bell. D'ALESSIO, M.D.     1. Hip pain, right  2. Degenerative arthritis of hip   3. Sprain of hip       MDM  Patient presents with right hip pain. She describes a recent fall on a sidewalk about a week ago where she was evaluated but was not having this pain on her right hip then. X-rays of her hip today reveal mild degenerative changes. No bruising or obvious ecchymosis were noted on her exam. Unknown neural muscular deficits distal to her hip or vascular deficiencies. Patient was encouraged to followup with her primary care doctor at a clinic in [facility] where she receives care or to consult the orthopedic Dr. as she has a history of hip bursitis. For further treatment and evaluation. Patient agreed with followup care and was given a prescription of meloxicam for 2 weeks.        Jimmie Molly, MD 08/06/11 1309

## 2011-08-06 NOTE — ED Notes (Signed)
Patient is resting comfortably; denies needs at this time.

## 2011-08-10 ENCOUNTER — Encounter (HOSPITAL_COMMUNITY): Payer: Self-pay | Admitting: Respiratory Therapy

## 2011-08-21 ENCOUNTER — Encounter (HOSPITAL_COMMUNITY)
Admission: RE | Disposition: A | Discharge status: Home / Self Care | Payer: Self-pay | Source: Ambulatory Visit | Attending: Vascular Surgery

## 2011-08-21 ENCOUNTER — Ambulatory Visit (HOSPITAL_COMMUNITY)
Admission: RE | Admit: 2011-08-21 | Discharge: 2011-08-21 | Disposition: A | Discharge status: Home / Self Care | Payer: Medicare Other | Source: Ambulatory Visit | Attending: Vascular Surgery | Admitting: Vascular Surgery

## 2011-08-21 ENCOUNTER — Telehealth: Payer: Self-pay | Admitting: Vascular Surgery

## 2011-08-21 ENCOUNTER — Other Ambulatory Visit: Payer: Self-pay | Admitting: *Deleted

## 2011-08-21 DIAGNOSIS — E039 Hypothyroidism, unspecified: Secondary | ICD-10-CM | POA: Insufficient documentation

## 2011-08-21 DIAGNOSIS — K219 Gastro-esophageal reflux disease without esophagitis: Secondary | ICD-10-CM | POA: Insufficient documentation

## 2011-08-21 DIAGNOSIS — M129 Arthropathy, unspecified: Secondary | ICD-10-CM | POA: Insufficient documentation

## 2011-08-21 DIAGNOSIS — J449 Chronic obstructive pulmonary disease, unspecified: Secondary | ICD-10-CM | POA: Insufficient documentation

## 2011-08-21 DIAGNOSIS — E119 Type 2 diabetes mellitus without complications: Secondary | ICD-10-CM | POA: Insufficient documentation

## 2011-08-21 DIAGNOSIS — J4489 Other specified chronic obstructive pulmonary disease: Secondary | ICD-10-CM | POA: Insufficient documentation

## 2011-08-21 DIAGNOSIS — I70219 Atherosclerosis of native arteries of extremities with intermittent claudication, unspecified extremity: Secondary | ICD-10-CM | POA: Insufficient documentation

## 2011-08-21 DIAGNOSIS — Z01818 Encounter for other preprocedural examination: Secondary | ICD-10-CM

## 2011-08-21 DIAGNOSIS — I739 Peripheral vascular disease, unspecified: Secondary | ICD-10-CM

## 2011-08-21 DIAGNOSIS — Z794 Long term (current) use of insulin: Secondary | ICD-10-CM | POA: Insufficient documentation

## 2011-08-21 DIAGNOSIS — F3289 Other specified depressive episodes: Secondary | ICD-10-CM | POA: Insufficient documentation

## 2011-08-21 DIAGNOSIS — F329 Major depressive disorder, single episode, unspecified: Secondary | ICD-10-CM | POA: Insufficient documentation

## 2011-08-21 DIAGNOSIS — E785 Hyperlipidemia, unspecified: Secondary | ICD-10-CM | POA: Insufficient documentation

## 2011-08-21 HISTORY — PX: ABDOMINAL AORTAGRAM: SHX5454

## 2011-08-21 LAB — POCT I-STAT, CHEM 8
Chloride: 104 mEq/L (ref 96–112)
Glucose, Bld: 132 mg/dL — ABNORMAL HIGH (ref 70–99)
HCT: 40 % (ref 36.0–46.0)
Hemoglobin: 13.6 g/dL (ref 12.0–15.0)
Potassium: 3.5 mEq/L (ref 3.5–5.1)
Sodium: 140 mEq/L (ref 135–145)

## 2011-08-21 LAB — GLUCOSE, CAPILLARY: Glucose-Capillary: 94 mg/dL (ref 70–99)

## 2011-08-21 SURGERY — ABDOMINAL AORTAGRAM
Anesthesia: LOCAL

## 2011-08-21 MED ORDER — POTASSIUM CHLORIDE CRYS ER 20 MEQ PO TBCR: 20.0000 meq | EXTENDED_RELEASE_TABLET | Freq: Once | ORAL | Status: DC | PRN

## 2011-08-21 MED ORDER — GUAIFENESIN-DM 100-10 MG/5ML PO SYRP: 15.0000 mL | ORAL_SOLUTION | ORAL | Status: DC | PRN

## 2011-08-21 MED ORDER — ACETAMINOPHEN 325 MG RE SUPP: 325.0000 mg | RECTAL | Status: DC | PRN

## 2011-08-21 MED ORDER — HEPARIN (PORCINE) IN NACL 2-0.9 UNIT/ML-% IJ SOLN
INTRAMUSCULAR | Status: AC
  Filled 2011-08-21: qty 1000

## 2011-08-21 MED ORDER — SODIUM CHLORIDE 0.9 % IV SOLN
INTRAVENOUS | Status: DC
  Administered 2011-08-21: 06:00:00 via INTRAVENOUS

## 2011-08-21 MED ORDER — LIDOCAINE HCL (PF) 1 % IJ SOLN
INTRAMUSCULAR | Status: AC
  Filled 2011-08-21: qty 30

## 2011-08-21 MED ORDER — ONDANSETRON HCL 4 MG/2ML IJ SOLN: 4.0000 mg | Freq: Four times a day (QID) | INTRAMUSCULAR | Status: DC | PRN

## 2011-08-21 MED ORDER — PHENOL 1.4 % MT LIQD: 1.0000 | OROMUCOSAL | Status: DC | PRN

## 2011-08-21 MED ORDER — LABETALOL HCL 5 MG/ML IV SOLN: 10.0000 mg | INTRAVENOUS | Status: DC | PRN

## 2011-08-21 MED ORDER — OXYCODONE HCL 5 MG PO TABS
ORAL_TABLET | ORAL | Status: AC
  Filled 2011-08-21: qty 2

## 2011-08-21 MED ORDER — OXYCODONE HCL 5 MG PO TABS
ORAL_TABLET | ORAL | Status: AC
  Administered 2011-08-21: 10 mg via ORAL
  Filled 2011-08-21: qty 2

## 2011-08-21 MED ORDER — MORPHINE SULFATE 10 MG/ML IJ SOLN: 2.0000 mg | INTRAMUSCULAR | Status: DC | PRN

## 2011-08-21 MED ORDER — METOPROLOL TARTRATE 1 MG/ML IV SOLN: 2.0000 mg | INTRAVENOUS | Status: DC | PRN

## 2011-08-21 MED ORDER — OXYCODONE HCL 5 MG PO TABS
5.0000 mg | ORAL_TABLET | ORAL | Status: DC | PRN
  Administered 2011-08-21: 10 mg via ORAL

## 2011-08-21 MED ORDER — ACETAMINOPHEN 325 MG PO TABS: 325.0000 mg | ORAL_TABLET | ORAL | Status: DC | PRN

## 2011-08-21 MED ORDER — MAGNESIUM SULFATE 40 MG/ML IJ SOLN: 2.0000 g | Freq: Once | INTRAMUSCULAR | Status: DC | PRN

## 2011-08-21 MED ORDER — HYDRALAZINE HCL 20 MG/ML IJ SOLN: 10.0000 mg | INTRAMUSCULAR | Status: DC | PRN

## 2011-08-21 MED ORDER — DEXTROSE 5 % IV SOLN: 1.5000 g | Freq: Two times a day (BID) | INTRAVENOUS | Status: DC

## 2011-08-21 MED ORDER — MIDAZOLAM HCL 2 MG/2ML IJ SOLN
INTRAMUSCULAR | Status: AC
  Filled 2011-08-21: qty 2

## 2011-08-21 MED ORDER — SODIUM CHLORIDE 0.9 % IV SOLN: INTRAVENOUS | Status: DC

## 2011-08-21 MED ORDER — FENTANYL CITRATE 0.05 MG/ML IJ SOLN
INTRAMUSCULAR | Status: AC
  Filled 2011-08-21: qty 2

## 2011-08-21 NOTE — H&P (View-Only) (Signed)
[facility] [place] HISTORY AND PHYSICAL   CC: Six-month followup for PAD Referring Physician: Fields  History of Present Illness: 66 year old female patient of Dr. Darrick Penna seen for lower extremity ABIs status post a occluded left femoral popliteal bypass done in April 2012. Patient has continued complaints of claudication that have somewhat worsened since she was seen last. The patient has wavered whether or not to have any further intervention. After some discussion today she has decided to speak with Dr. Darrick Penna regarding her claudication. Patient denies rest pain and has no open wounds.  Past Medical History  Diagnosis Date  . Hyperlipidemia   . Arthritis   . Depression   . Hypothyroidism   . GERD (gastroesophageal reflux disease)   . Anxiety   . Diabetes mellitus age 72    insulin dependent  . Peripheral vascular disease   . Lumbar disc disease   . Personal history of malignant neoplasm of kidney 12/14/2008    Laparoscopic biopsy and cryoablation 7/08 Dr. Normajean Baxter   . Insulin dependent type 2 diabetes mellitus, controlled   . COPD 12/14/2008  . GERD 07/20/2006    ROS: [x]  Positive   [ ]  Denies    General: [ ]  Weight loss, [ ]  Fever, [ ]  chills Neurologic: [ ]  Dizziness, [ ]  Blackouts, [ ]  Seizure [ ]  Stroke, [ ]  "Mini stroke", [ ]  Slurred speech, [ ]  Temporary blindness; [ ]  weakness in arms or legs, [ ]  Hoarseness Cardiac: [ ]  Chest pain/pressure, [ ]  Shortness of breath at rest [ ]  Shortness of breath with exertion, [ ]  Atrial fibrillation or irregular heartbeat Vascular: [ x] Pain in legs with walking, [x ] Pain in legs at rest, [ ]  Pain in legs at night,  [ ]  Non-healing ulcer, [x ] Blood clot in vein/DVT,   Pulmonary: [ ]  Home oxygen, [ ]  Productive cough, [ ]  Coughing up blood, [ ]  Asthma,  [ ]  Wheezing Musculoskeletal:  [ ]  Arthritis, [ ]  Low back pain, [ ]  Joint pain Hematologic: [ ]  Easy Bruising, [ ]  Anemia; [ ]  Hepatitis Gastrointestinal: [ ]   Blood in stool, [ ]  Gastroesophageal Reflux/heartburn, [ ]  Trouble swallowing Urinary: [ ]  chronic Kidney disease, [ ]  on HD - [ ]  MWF or [ ]  TTHS, [ ]  Burning with urination, [ ]  Difficulty urinating Skin: [ ]  Rashes, [ ]  Wounds Psychological: [ ]  Anxiety, [ ]  Depression   Social History History  Substance Use Topics  . Smoking status: Current Everyday Smoker -- 0.5 packs/day for 50 years    Types: Cigarettes  . Smokeless tobacco: Not on file  . Alcohol Use: No    Family History Family History  Problem Relation Age of Onset  . Heart disease Mother   . Heart disease Father   . Heart disease Sister   . Lung cancer Mother   . Lung cancer Father     Allergies  Allergen Reactions  . Niacin-Simvastatin Er Hives  . Amoxicillin-Pot Clavulanate Rash    Current Outpatient Prescriptions  Medication Sig Dispense Refill  . amitriptyline (ELAVIL) 75 MG tablet Take 75 mg by mouth at bedtime.      . clopidogrel (PLAVIX) 75 MG tablet Take 75 mg by mouth daily.        . cyclobenzaprine (FLEXERIL) 5 MG tablet Take 5 mg by mouth 2 (two) times daily as needed. For muscle spasms      . insulin aspart (NOVOLOG) 100 UNIT/ML injection Inject 4-6 Units  into the skin 3 (three) times daily before meals.        . insulin glargine (LANTUS) 100 UNIT/ML injection Inject 60 Units into the skin at bedtime.        . lansoprazole (PREVACID) 30 MG capsule Take 30 mg by mouth daily.        Marland Kitchen levothyroxine (SYNTHROID, LEVOTHROID) 88 MCG tablet Take 88 mcg by mouth daily.      . metFORMIN (GLUCOPHAGE) 850 MG tablet Take 850 mg by mouth 2 (two) times daily with a meal.      . metoprolol tartrate (LOPRESSOR) 25 MG tablet Take 25 mg by mouth 2 (two) times daily.      . naproxen (NAPROSYN) 250 MG tablet Take 1 tablet (250 mg total) by mouth 2 (two) times daily with a meal.      . Naproxen Sodium (ALEVE) 220 MG CAPS Take 200 mg by mouth as needed.      . tolterodine (DETROL LA) 4 MG 24 hr capsule Take 4 mg by mouth  daily.        Marland Kitchen HYDROcodone-acetaminophen (NORCO) 10-325 MG per tablet Take 1 tablet by mouth every 6 (six) hours as needed.      Marland Kitchen LORazepam (ATIVAN) 1 MG tablet Take 1 mg by mouth 3 (three) times daily.       . pravastatin (PRAVACHOL) 20 MG tablet Take 20 mg by mouth daily.      Marland Kitchen DISCONTD: Gabapentin (NEURONTIN PO) Take by mouth. New medicine, pt doesn't remember dosage        Physical Examination  Filed Vitals:   07/30/11 1154  BP: 121/75  Pulse: 73  Resp: 14    Body mass index is 28.12 kg/(m^2).  General:  WDWN in NAD Gait: Normal HEENT: WNL Eyes: Pupils equal Pulmonary: normal non-labored breathing , without Rales, rhonchi,  wheezing Cardiac: RRR, without  Murmurs, rubs or gallops; No carotid bruits Abdomen: soft, NT, no masses Skin: no rashes, ulcers noted Vascular Exam/Pulses: Palpable femoral pulses bilaterally, PT and DP pulses are 1+  Extremities without ischemic changes, no Gangrene , no cellulitis; no open wounds;  Musculoskeletal: no muscle wasting or atrophy  Neurologic: A&O X 3; Appropriate Affect ; SENSATION: normal; MOTOR FUNCTION:  moving all extremities equally. Speech is fluent/normal  Non-Invasive Vascular Imaging: ABIs today are 0.8 on the right and 0.86 on the left she is monophasic on the right and monophasic to biphasic on the left  ASSESSMENT/PLAN: Imaging discussed with Dr. Darrick Penna who spoke with the patient regarding her claudication. Decision was made to proceed with a angiogram with runoff and possible intervention. This is scheduled for 08/21/2011 with Dr. Darrick Penna, followup will be pending that procedure. Dr. Darrick Penna gave a prescription for 30 Percocet with no intention to refill this.  Lauree Chandler ANP next  Clinic M.D.: Fields

## 2011-08-21 NOTE — Telephone Encounter (Signed)
Message copied by Fredrich Birks on Fri Aug 21, 2011  2:26 PM ------      Message from: Melene Plan      Created: Fri Aug 21, 2011 10:25 AM       Can you arrange for her to see Dr Eden Emms?      ----- Message -----         From: Sherren Kerns, MD         Sent: 08/21/2011   8:24 AM           To: Reuel Derby, Melene Plan, RN            Aortogram with bilat runoff      2nd order left iliac            She needs a left fem pop bypass      She needs to see Dr Eden Emms first.            This can be electively scheduled but needs H and  P with Rusty if it is longer than one month.            Leonette Most

## 2011-08-21 NOTE — Op Note (Signed)
Procedure: Aortogram with bilateral lower extremity runoff  Preoperative diagnosis: Claudication  Postoperative diagnosis: Same  Anesthesia: Local with IV sedation  Indications: Patient has long-standing bilateral lower extremity claudication left greater than right.  Operative details: After obtaining informed consent, the patient was taken to the [facility] lab. The patient was placed in supine position the Angio table. Both groins were prepped and draped in usual sterile fashion. Local anesthesia was infiltrated over the area of the right common femoral artery. An introducer needle was used to cannulate the right common femoral artery and an 035 Versacore wire was threaded into the abdominal aorta under fluoroscopic guidance. Next a 5 French sheath was placed over the guidewire in the right common femoral artery. This was thoroughly flushed with heparinized saline. A 5 French pigtail catheter was then placed over the guidewire into the abdominal aorta. Abdominal aortogram was obtained. This shows the left and right renal arteries are patent. The infrarenal abdominal aorta is patent. The left and right common external and internal iliac arteries are patent.  Next a 5 French pigtail catheter was pulled back over a guidewire and replaced with a 5 [place] crossover catheter. This was used to selectively catheterize the left common iliac artery. An 035 angled Glidewire was then advanced down into the distal left external iliac artery. A 5 French crossover catheter was removed and replaced with a 5 French straight catheter. This was then placed over the wire into the distal left external iliac artery. A left lower extremity arteriogram was obtained. The left common femoral artery is patent. The left profunda femoris artery is patent. The left superficial femoral artery is occluded at its origin. There is a proximal bypass graft that occludes approximately 1 cm after it's origin. The right superficial femoral artery  reconstitutes via profunda collaterals at the level of the adductor hiatus. There is a 50% stenosis of the above-knee popliteal artery just above the joint. The below-knee popliteal artery is patent. There is three-vessel runoff to the left foot. However the plantar arch is incomplete. There is obliteration of the lateral plantar artery.  The 5 French catheter was then pulled back over guidewire. A right lower extremity arteriogram was obtained to the 5 French sheath. The right common femoral artery is patent. The right superficial femoral artery is severely diseased and occludes approximate 7 cm after it's origin. The above-knee popliteal artery reconstitutes via superficial femoral and profunda collaterals. The below-knee popliteal artery is patent. The anterior tibial artery is occluded at its origin. It then reconstitutes via collaterals approximately 2 cm after it's origin. The peroneal artery and tibioperoneal trunk is patent. The posterior tibial artery is patent but occludes in the distal leg. The primary runoff vessel to the right foot as the peroneal artery but the DP does fill via the reconstituted AT it is very small.  The patient tolerated procedure well there were no complications. The patient was taken to the holding area in stable condition. The sheath will be removed in the holding area.    Operative management: The patient will be scheduled for a left femoral to below-knee popliteal bypass in the near future after cardiac risk stratification.  Fabienne Bruns, MD [facility] Office: [phone] Pager: [phone]

## 2011-08-21 NOTE — Telephone Encounter (Signed)
Patient scheduled to see Dr Eden Emms on 09/24/2011 @ 3:00pm- lvm for pt to call me back to let me know that she received the message, dpm

## 2011-08-21 NOTE — Discharge Instructions (Signed)

## 2011-08-21 NOTE — Interval H&P Note (Signed)
History and Physical Interval Note:  08/21/2011 7:25 AM  April Branch  has presented today for surgery, with the diagnosis of PVD  The various methods of treatment have been discussed with the patient and family. After consideration of risks, benefits and other options for treatment, the patient has consented to  Procedure(s) (LRB): ABDOMINAL AORTAGRAM (N/A) as a surgical intervention .  The patient's history has been reviewed, patient examined, no change in status, stable for surgery.  I have reviewed the patients' chart and labs.  Questions were answered to the patient's satisfaction.     April Branch

## 2011-08-25 ENCOUNTER — Telehealth: Payer: Self-pay | Admitting: Neurosurgery

## 2011-08-25 NOTE — Telephone Encounter (Signed)
LVM for patient regarding follow up with NP after cardiac clearance, asked that pt call back to confirm appt, dpm

## 2011-08-25 NOTE — Telephone Encounter (Signed)
Message copied by Fredrich Birks on Tue Aug 25, 2011  2:28 PM ------      Message from: Melene Plan      Created: Tue Aug 25, 2011  1:32 PM       Obviously she will need an h&p appt with Toney Sang after that      ----- Message -----         From: Fredrich Birks         Sent: 08/21/2011   2:24 PM           To: Melene Plan, RN            Dr Ricki Miller next available was for 09/24/11 @ 3:00 pm per Tamika @ [facility]. Notified patient, dpm                  ----- Message -----         From: Melene Plan, RN         Sent: 08/21/2011  10:25 AM           To: Sharee Pimple, CMA, #            Can you arrange for her to see Dr Eden Emms?      ----- Message -----         From: Sherren Kerns, MD         Sent: 08/21/2011   8:24 AM           To: Reuel Derby, Melene Plan, RN            Aortogram with bilat runoff      2nd order left iliac            She needs a left fem pop bypass      She needs to see Dr Eden Emms first.            This can be electively scheduled but needs H and  P with Rusty if it is longer than one month.            Leonette Most

## 2011-08-28 ENCOUNTER — Telehealth: Payer: Self-pay | Admitting: Vascular Surgery

## 2011-08-28 NOTE — Telephone Encounter (Addendum)
Message copied by Shari Prows on Fri Aug 28, 2011  3:48 PM ------      Message from: Phillips Odor      Created: Fri Aug 28, 2011  3:08 PM      Regarding: needs H & P appt. changed      Contact: [phone]       Pt. Scheduled for H & P w/ Rusty on 09/25/11.  Has several questions before her Fem Pop BP on 8/19.  Please change her appt. to 10/08/11 when Dr. Darrick Penna is in the office.  Thank you.  I called pt to reschedule appointment per above message. She is coming to [facility] to see [facility] on 10/08/11 at 1pm. She is aware of this appointment change. Jacklyn Shell

## 2011-09-24 ENCOUNTER — Other Ambulatory Visit: Payer: Self-pay

## 2011-09-24 ENCOUNTER — Encounter: Payer: Self-pay | Admitting: Cardiovascular Disease

## 2011-09-24 ENCOUNTER — Ambulatory Visit (INDEPENDENT_AMBULATORY_CARE_PROVIDER_SITE_OTHER): Payer: Medicare Other | Admitting: Cardiovascular Disease

## 2011-09-24 VITALS — BP 129/77 | HR 84 | Ht 71.0 in | Wt 144.0 lb

## 2011-09-24 DIAGNOSIS — I10 Essential (primary) hypertension: Secondary | ICD-10-CM

## 2011-09-24 DIAGNOSIS — R0989 Other specified symptoms and signs involving the circulatory and respiratory systems: Secondary | ICD-10-CM

## 2011-09-24 DIAGNOSIS — I739 Peripheral vascular disease, unspecified: Secondary | ICD-10-CM

## 2011-09-24 DIAGNOSIS — I2510 Atherosclerotic heart disease of native coronary artery without angina pectoris: Secondary | ICD-10-CM

## 2011-09-24 DIAGNOSIS — E785 Hyperlipidemia, unspecified: Secondary | ICD-10-CM

## 2011-09-24 NOTE — Assessment & Plan Note (Signed)
Well controlled.  Continue current medications and low sodium Dash type diet.    

## 2011-09-24 NOTE — Assessment & Plan Note (Signed)
Cholesterol is at goal.  Continue current dose of statin and diet Rx.  No myalgias or side effects.  F/U  LFT's in 6 months. Lab Results  Component Value Date   LDLCALC 76 02/23/2008

## 2011-09-24 NOTE — Patient Instructions (Addendum)
Your physician wants you to follow-up in:  6 MONTHS WITH DR Haywood Filler will receive a reminder letter in the mail two months in advance. If you don't receive a letter, please call our office to schedule the follow-up appointment. Your physician recommends that you continue on your current medications as directed. Please refer to the Current Medication list given to you today Your physician has requested that you have a carotid duplex. This test is an ultrasound of the carotid arteries in your neck. It looks at blood flow through these arteries that supply the brain with blood. Allow one hour for this exam. There are no restrictions or special instructions. DX BRUIT  ASAP  PRE OP

## 2011-09-24 NOTE — Assessment & Plan Note (Signed)
Nonobstructive by cath 3/13 No angina  Clear to have vascular surgery

## 2011-09-24 NOTE — Progress Notes (Signed)
Patient ID: April Branch, female   DOB: August 13, 1945, 66 y.o.   MRN: [id number] This 66 year old female has a prior history of mild coronary artery disease. She had catheterization done previously by Dr. Fraser Din in 2003 with a 30% circumflex stenosis but not much disease otherwise. She has had a dobutamine Cardiolite study done previously that was nonischemic in 2010. She has had significant claudication and recently had a left femoral-popliteal bypass graft that has since occluded.  She presented to the emergency room 05/08/11  with substernal chest discomfort described as a heaviness with some radiation up to her left ear and associated with some pulsating feeling. An EKG showed minor nonspecific ST changes and her cardiac enzymes have been negative. She is continued to have chest discomfort that is unrelieved despite the presence of nitroglycerin intravenously and she is currently being given morphine. A chest CT scan showed significant coronary calcifications and other vascular calcification. She is somewhat inactive but walks her dog at home and is mainly limited by claudication. She has significant issues with anxiety and depression. She has had a significant GI history in the past. She denies PND, orthopnea, palpitations, or syncope.  Cath by Dr Teressa Lower showed nonobstructive disease and patient D/C home  Has had progressive claudication of LLE  Needs repeat fempop on left.  Did stop smoking 2 months ago.  ? Vertigo and mini strokes.  No recent carotid duplex.  MRI done 07/31/11 no acute changes MRA not done  ROS: Denies fever, malais, weight loss, blurry vision, decreased visual acuity, cough, sputum, SOB, hemoptysis, pleuritic pain, palpitaitons, heartburn, abdominal pain, melena, lower extremity edema, claudication, or rash.  All other systems reviewed and negative  General: Affect appropriate Chronically ill female HEENT: normal Neck supple with no adenopathy JVP normal right  bruits no  thyromegaly Lungs clear with no wheezing and good diaphragmatic motion Heart:  S1/S2 no murmur, no rub, gallop or click PMI normal Abdomen: benighn, BS positve, no tenderness, no AAA no bruit.  No HSM or HJR Distal pulses diminished on left with left femoral bruit and previous PV surgeryNo edema Neuro non-focal Skin warm and dry No muscular weakness   Current Outpatient Prescriptions  Medication Sig Dispense Refill  . amitriptyline (ELAVIL) 75 MG tablet Take 75 mg by mouth at bedtime.      . clopidogrel (PLAVIX) 75 MG tablet Take 75 mg by mouth daily.        . cyclobenzaprine (FLEXERIL) 10 MG tablet Take 10 mg by mouth every 8 (eight) hours as needed. For muscle spasms      . ferrous sulfate (IRON SUPPLEMENT) 325 (65 FE) MG tablet Take 325 mg by mouth daily with breakfast.      . HYDROcodone-acetaminophen (NORCO) 10-325 MG per tablet Take 1-2 tablets by mouth every 6 (six) hours as needed. For pain      . insulin aspart (NOVOLOG) 100 UNIT/ML injection Inject 4-6 Units into the skin 2 (two) times daily.       . insulin glargine (LANTUS) 100 UNIT/ML injection Inject 60 Units into the skin at bedtime.        . lansoprazole (PREVACID) 30 MG capsule Take 30 mg by mouth daily.        Marland Kitchen levothyroxine (SYNTHROID, LEVOTHROID) 88 MCG tablet Take 88 mcg by mouth daily.      Marland Kitchen LORazepam (ATIVAN) 1 MG tablet Take 1 mg by mouth every 8 (eight) hours.      . meclizine (ANTIVERT) 25 MG tablet Take  25 mg by mouth 2 (two) times daily as needed. dizziness      . meloxicam (MOBIC) 7.5 MG tablet Take 1 tablet (7.5 mg total) by mouth daily.  14 tablet  0  . metFORMIN (GLUCOPHAGE) 850 MG tablet Take 850 mg by mouth 2 (two) times daily with a meal.      . metoprolol tartrate (LOPRESSOR) 25 MG tablet Take 25 mg by mouth 2 (two) times daily.      . pravastatin (PRAVACHOL) 20 MG tablet Take 20 mg by mouth daily.      . promethazine (PHENERGAN) 25 MG tablet Take 25 mg by mouth every 6 (six) hours as needed. For  nausea/vomiting      . tolterodine (DETROL LA) 4 MG 24 hr capsule Take 4 mg by mouth daily.        Marland Kitchen DISCONTD: Gabapentin (NEURONTIN PO) Take by mouth. New medicine, pt doesn't remember dosage        Allergies  Niacin-simvastatin er  Electrocardiogram:  08/11/11  NSR poor R wave progression nonspecific ST/T wave changes  Assessment and Plan

## 2011-09-24 NOTE — Assessment & Plan Note (Signed)
Right carotid bruit with ? Vertigo  Check duplex.  PVD per Dr Darrick Penna  With smoking cessation hopefully new graft will be more succesful

## 2011-09-25 ENCOUNTER — Ambulatory Visit: Payer: BC Managed Care – PPO | Admitting: Neurosurgery

## 2011-09-28 ENCOUNTER — Encounter (INDEPENDENT_AMBULATORY_CARE_PROVIDER_SITE_OTHER): Payer: Medicare Other

## 2011-09-28 ENCOUNTER — Encounter (HOSPITAL_COMMUNITY): Payer: Self-pay | Admitting: Pharmacy Technician

## 2011-09-28 DIAGNOSIS — I739 Peripheral vascular disease, unspecified: Secondary | ICD-10-CM

## 2011-09-28 DIAGNOSIS — R0989 Other specified symptoms and signs involving the circulatory and respiratory systems: Secondary | ICD-10-CM

## 2011-09-28 DIAGNOSIS — I6529 Occlusion and stenosis of unspecified carotid artery: Secondary | ICD-10-CM

## 2011-10-05 ENCOUNTER — Encounter (HOSPITAL_COMMUNITY)
Admission: RE | Admit: 2011-10-05 | Discharge: 2011-10-05 | Disposition: A | Discharge status: Home / Self Care | Payer: Medicare Other | Source: Ambulatory Visit | Attending: Vascular Surgery | Admitting: Vascular Surgery

## 2011-10-05 ENCOUNTER — Encounter (HOSPITAL_COMMUNITY): Payer: Self-pay

## 2011-10-05 ENCOUNTER — Telehealth: Payer: Self-pay | Admitting: Cardiovascular Disease

## 2011-10-05 ENCOUNTER — Ambulatory Visit (HOSPITAL_COMMUNITY)
Admission: RE | Admit: 2011-10-05 | Discharge: 2011-10-05 | Disposition: A | Discharge status: Home / Self Care | Payer: Medicare Other | Source: Ambulatory Visit | Attending: Vascular Surgery | Admitting: Vascular Surgery

## 2011-10-05 DIAGNOSIS — Z01818 Encounter for other preprocedural examination: Secondary | ICD-10-CM | POA: Insufficient documentation

## 2011-10-05 DIAGNOSIS — Z01812 Encounter for preprocedural laboratory examination: Secondary | ICD-10-CM | POA: Insufficient documentation

## 2011-10-05 HISTORY — DX: Myoneural disorder, unspecified: G70.9

## 2011-10-05 HISTORY — DX: Headache: R51

## 2011-10-05 HISTORY — DX: Essential (primary) hypertension: I10

## 2011-10-05 HISTORY — DX: Anemia, unspecified: D64.9

## 2011-10-05 HISTORY — DX: Cerebral infarction, unspecified: I63.9

## 2011-10-05 LAB — CBC
Hemoglobin: 13.8 g/dL (ref 12.0–15.0)
MCH: 27.8 pg (ref 26.0–34.0)
MCHC: 34 g/dL (ref 30.0–36.0)
MCV: 81.9 fL (ref 78.0–100.0)
Platelets: 324 10*3/uL (ref 150–400)
RBC: 4.96 MIL/uL (ref 3.87–5.11)

## 2011-10-05 LAB — URINALYSIS, ROUTINE W REFLEX MICROSCOPIC
Leukocytes, UA: NEGATIVE
Nitrite: NEGATIVE
Specific Gravity, Urine: 1.021 (ref 1.005–1.030)
Urobilinogen, UA: 0.2 mg/dL (ref 0.0–1.0)
pH: 5.5 (ref 5.0–8.0)

## 2011-10-05 LAB — SURGICAL PCR SCREEN
MRSA, PCR: NEGATIVE
Staphylococcus aureus: NEGATIVE

## 2011-10-05 LAB — COMPREHENSIVE METABOLIC PANEL
CO2: 25 mEq/L (ref 19–32)
Calcium: 9.9 mg/dL (ref 8.4–10.5)
Creatinine, Ser: 0.93 mg/dL (ref 0.50–1.10)
GFR calc Af Amer: 74 mL/min — ABNORMAL LOW (ref >90)
GFR calc non Af Amer: 64 mL/min — ABNORMAL LOW (ref >90)
Glucose, Bld: 119 mg/dL — ABNORMAL HIGH (ref 70–99)
Total Protein: 8.2 g/dL (ref 6.0–8.3)

## 2011-10-05 LAB — PROTIME-INR: Prothrombin Time: 14.1 seconds (ref 11.6–15.2)

## 2011-10-05 NOTE — Pre-Procedure Instructions (Signed)
20 April Branch  10/05/2011   Your procedure is scheduled on:  Mon, Aug 19 @ 7:30 AM  Report to [facility] at 5:30 AM.  Call this number if you have problems the morning of surgery: [phone]   Remember:   Do not eat food:After Midnight.  Take these medicines the morning of surgery with A SIP OF WATER: Metoprolol(Lopressor),Meclizine(Antivert),Lorazepam(Ativan),Lansoprazole(Prevacid),and Pain Pill(if needed)   Do not wear jewelry, make-up or nail polish.  Do not wear lotions, powders, or perfumes.   Do not shave 48 hours prior to surgery.  Do not bring valuables to the hospital.  Contacts, dentures or bridgework may not be worn into surgery.  Leave suitcase in the car. After surgery it may be brought to your room.  For patients admitted to the hospital, checkout time is 11:00 AM the day of discharge.   Patients discharged the day of surgery will not be allowed to drive home.    Special Instructions: CHG Shower Use Special Wash: 1/2 bottle night before surgery and 1/2 bottle morning of surgery.   Please read over the following fact sheets that you were given: Pain Booklet, Coughing and Deep Breathing, Blood Transfusion Information, MRSA Information and Surgical Site Infection Prevention

## 2011-10-05 NOTE — Telephone Encounter (Signed)
New Problem:    Called in with a question about when to stop the patient's clopidogrel (PLAVIX) 75 MG tablet prior to 10/12/11.  Please call back.

## 2011-10-05 NOTE — Telephone Encounter (Signed)
Received call from carol from [facility] asking if OK to d/c plavix 5 days prior to fem-pop bypass surgery with dr Caryl Asp with dr Eden Emms who OKed discontinuation of plavix 5 days prior to surgery--carol made aware and she states she will inform pt--nt

## 2011-10-05 NOTE — Progress Notes (Signed)
SPOKE WITH CAROL AT DR FIELDS OFFICE ABOUT PLAVIX, CAROL STATED SHE WILL CHECK WITH DR Eden Emms AND CALL PATIENT AT HOME.

## 2011-10-06 LAB — TYPE AND SCREEN: ABO/RH(D): O POS

## 2011-10-07 ENCOUNTER — Encounter: Payer: Self-pay | Admitting: Vascular Surgery

## 2011-10-08 ENCOUNTER — Encounter: Payer: Self-pay | Admitting: Vascular Surgery

## 2011-10-08 ENCOUNTER — Ambulatory Visit (INDEPENDENT_AMBULATORY_CARE_PROVIDER_SITE_OTHER): Payer: Medicare Other | Admitting: Vascular Surgery

## 2011-10-08 VITALS — BP 145/68 | HR 79 | Resp 16 | Ht 61.0 in | Wt 142.0 lb

## 2011-10-08 DIAGNOSIS — I70219 Atherosclerosis of native arteries of extremities with intermittent claudication, unspecified extremity: Secondary | ICD-10-CM

## 2011-10-08 NOTE — Progress Notes (Signed)
[facility] [place] HISTORY AND PHYSICAL   History of Present Illness:  66-year-old female patient seen for lower extremity ABIs status post a occluded left femoral popliteal bypass done in April 2012. Patient has continued complaints of claudication that have somewhat worsened since she was seen last. The patient has wavered whether or not to have any further intervention.  Patient denies rest pain and has no open wounds. General recent arteriogram which shows that she would be a candidate for a left femoral to below-knee popliteal bypass. She states her claudication is been getting worse and has decided at this point undergo a redo left femoral to below-knee popliteal bypass. He was recently seen by Dr. Nishan who evaluated her for cardiac risk. She is thought to be low risk. She is on Plavix and aspirin   Past Medical History  Diagnosis Date  . Hyperlipidemia   . Arthritis   . Hypothyroidism   . GERD (gastroesophageal reflux disease)   . Anxiety   . Diabetes mellitus age 42    insulin dependent  . Peripheral vascular disease   . Lumbar disc disease   . Insulin dependent type 2 diabetes mellitus, controlled   . GERD 07/20/2006  . CAD (coronary artery disease)   . CAD (coronary artery disease) of bypass graft   . Vertigo   . History of transient ischemic attack (TIA)   . Myocardial infarction     AGE 42    . Anginal pain   . Hypertension   . Depression   . COPD 12/14/2008    PATIENT DENIES    . Stroke     3 MINI STROKES  RT SIDED WEAKNESS  . Personal history of malignant neoplasm of kidney 12/14/2008    Laparoscopic biopsy and cryoablation 7/08 Dr. Dalstedt   . Headache   . 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 Neuromuscular disorder     PERIPHERAL NEUROPATHY  . Anemia     Past Surgical History  Procedure Date  . Appendectomy   . Abdominal hysterectomy   . Knee surgery   . Cholecystectomy   . Lung surgery     lung nodule removed from the  right side  . Laparascopic cryoablation of left kidney 08/2006    Dr. Dalstedy for renal cell cancer  . Bladder suspension   . Shoulder surgery   . Foot surgery   . Pr vein bypass graft,aorto-fem-pop 05/27/2010  . Cardiac catheterization      Social History History  Substance Use Topics  . Smoking status: Current Everyday Smoker -- 50 years    Types: Cigarettes  . Smokeless tobacco: Not on file  . Alcohol Use: No    Family History Family History  Problem Relation Age of Onset  . Heart disease Mother   . Heart disease Father   . Heart disease Sister   . Lung cancer Mother   . Lung cancer Father     Allergies  Allergies  Allergen Reactions  . Niacin-Simvastatin Er Hives     Current Outpatient Prescriptions  Medication Sig Dispense Refill  . amitriptyline (ELAVIL) 75 MG tablet Take 75 mg by mouth at bedtime.      . clopidogrel (PLAVIX) 75 MG tablet Take 75 mg by mouth daily.        . cyclobenzaprine (FLEXERIL) 10 MG tablet Take 10 mg by mouth every 8 (eight) hours as needed. For muscle spasms      .   ferrous sulfate (IRON SUPPLEMENT) 325 (65 FE) MG tablet Take 325 mg by mouth daily with breakfast.      . HYDROcodone-acetaminophen (NORCO) 10-325 MG per tablet Take 1-2 tablets by mouth every 6 (six) hours as needed. For pain      . insulin aspart (NOVOLOG) 100 UNIT/ML injection Inject 4-6 Units into the skin 2 (two) times daily.       . insulin glargine (LANTUS) 100 UNIT/ML injection Inject 60 Units into the skin at bedtime.        . lansoprazole (PREVACID) 30 MG capsule Take 30 mg by mouth daily.        . levothyroxine (SYNTHROID, LEVOTHROID) 88 MCG tablet Take 88 mcg by mouth daily.      . LORazepam (ATIVAN) 1 MG tablet Take 1 mg by mouth every 8 (eight) hours.      . meclizine (ANTIVERT) 25 MG tablet Take 25 mg by mouth 2 (two) times daily as needed. dizziness      . meloxicam (MOBIC) 7.5 MG tablet Take 1 tablet (7.5 mg total) by mouth daily.  14 tablet  0  . metFORMIN  (GLUCOPHAGE) 850 MG tablet Take 850 mg by mouth 2 (two) times daily with a meal.      . metoprolol tartrate (LOPRESSOR) 25 MG tablet Take 25 mg by mouth 2 (two) times daily.      . pravastatin (PRAVACHOL) 20 MG tablet Take 20 mg by mouth daily.      . promethazine (PHENERGAN) 25 MG tablet Take 25 mg by mouth every 6 (six) hours as needed. For nausea/vomiting      . tolterodine (DETROL LA) 4 MG 24 hr capsule Take 4 mg by mouth daily.        . DISCONTD: Gabapentin (NEURONTIN PO) Take by mouth. New medicine, pt doesn't remember dosage        ROS:   General:  No weight loss, Fever, chills  HEENT: No recent headaches, no nasal bleeding, no visual changes, no sore throat  Neurologic: No dizziness, blackouts, seizures. No recent symptoms of stroke or mini- stroke. No recent episodes of slurred speech, or temporary blindness.  Cardiac: No recent episodes of chest pain/pressure, no shortness of breath at rest.  No shortness of breath with exertion.  Denies history of atrial fibrillation or irregular heartbeat  Vascular: No history of rest pain in feet.  No history of claudication.  No history of non-healing ulcer, No history of DVT   Pulmonary: No home oxygen, no productive cough, no hemoptysis,  No asthma or wheezing  Musculoskeletal:  [ ] Arthritis, [ ] Low back pain,  [ ] Joint pain  Hematologic:No history of hypercoagulable state.  No history of easy bleeding.  No history of anemia  Gastrointestinal: No hematochezia or melena,  No gastroesophageal reflux, no trouble swallowing  Urinary: [ ] chronic Kidney disease, [ ] on HD - [ ] MWF or [ ] TTHS, [ ] Burning with urination, [ ] Frequent urination, [ ] Difficulty urinating;   Skin: No rashes  Psychological: No history of anxiety,  No history of depression   Physical Examination  Filed Vitals:   10/08/11 1312  BP: 145/68  Pulse: 79  Resp: 16  Height: 5' 1" (1.549 m)  Weight: 142 lb (64.411 kg)  SpO2: 100%    Body mass index is  26.83 kg/(m^2).  General:  Alert and oriented, no acute distress HEENT: Normal Neck: No bruit or JVD Pulmonary: Clear to auscultation bilaterally Cardiac:   Regular Rate and Rhythm without murmur Abdomen: Soft, non-tender, non-distended, no mass, no scars Skin: No rash, prior scars left groin and leg from previous bypass Extremity Pulses:  2+ radial, brachial, femoral, absent dorsalis pedis, posterior tibial pulses bilaterally Musculoskeletal: No deformity or edema  Neurologic: Upper and lower extremity motor 5/5 and symmetric  DATA: Patent below knee popliteal artery left leg with three-vessel runoff, occluded left femoral above-knee pop   ASSESSMENT: Disabling claudication left lower extremity   PLAN:  Redo left femoral to below-knee popliteal bypass on Monday, 10/12/2011. Risks benefits possible complications and procedure details were explained the patient today including but not limited to bleeding infection graft thrombosis possible limb loss she understands and agrees to proceed.  It was also emphasized to the patient that the operation is being done for claudication symptoms in the left leg. It will not do anything to improve her restless leg type symptoms in the left leg. It well not do anything to improve the numbness tingling or aching in her left foot. Patient was given a prescription today for Vicodin for some intermittent right hip pain that she has had recently no further refill.  Charles Fields, MD [facility] [place] Office: [phone] Pager: [phone]  

## 2011-10-09 NOTE — Progress Notes (Signed)
Patient called about time change on 10/12/11. States Dr. Darrick Penna office told her to come in at 0700. Patient stated understanding.

## 2011-10-11 MED ORDER — DEXTROSE 5 % IV SOLN
1.5000 g | INTRAVENOUS | Status: AC
  Administered 2011-10-12: 1.5 g via INTRAVENOUS
  Filled 2011-10-11: qty 1.5

## 2011-10-12 ENCOUNTER — Encounter (HOSPITAL_COMMUNITY): Payer: Self-pay

## 2011-10-12 ENCOUNTER — Encounter (HOSPITAL_COMMUNITY)
Admission: RE | Disposition: A | Discharge status: Home / Self Care | Payer: Self-pay | Source: Ambulatory Visit | Attending: Vascular Surgery

## 2011-10-12 ENCOUNTER — Encounter (HOSPITAL_COMMUNITY): Payer: Self-pay | Admitting: Anesthesiology

## 2011-10-12 ENCOUNTER — Encounter (HOSPITAL_COMMUNITY): Payer: Self-pay | Admitting: *Deleted

## 2011-10-12 ENCOUNTER — Ambulatory Visit (HOSPITAL_COMMUNITY): Payer: Medicare Other | Admitting: Anesthesiology

## 2011-10-12 ENCOUNTER — Inpatient Hospital Stay (HOSPITAL_COMMUNITY)
Admission: RE | Admit: 2011-10-12 | Discharge: 2011-10-14 | DRG: 253 | Disposition: A | Discharge status: Home / Self Care | Payer: Medicare Other | Source: Ambulatory Visit | Attending: Vascular Surgery | Admitting: Vascular Surgery

## 2011-10-12 ENCOUNTER — Ambulatory Visit (HOSPITAL_COMMUNITY): Payer: Medicare Other

## 2011-10-12 DIAGNOSIS — J4489 Other specified chronic obstructive pulmonary disease: Secondary | ICD-10-CM | POA: Diagnosis present

## 2011-10-12 DIAGNOSIS — G609 Hereditary and idiopathic neuropathy, unspecified: Secondary | ICD-10-CM | POA: Diagnosis present

## 2011-10-12 DIAGNOSIS — Z794 Long term (current) use of insulin: Secondary | ICD-10-CM

## 2011-10-12 DIAGNOSIS — F3289 Other specified depressive episodes: Secondary | ICD-10-CM | POA: Diagnosis present

## 2011-10-12 DIAGNOSIS — I739 Peripheral vascular disease, unspecified: Secondary | ICD-10-CM

## 2011-10-12 DIAGNOSIS — D649 Anemia, unspecified: Secondary | ICD-10-CM | POA: Diagnosis present

## 2011-10-12 DIAGNOSIS — Z951 Presence of aortocoronary bypass graft: Secondary | ICD-10-CM

## 2011-10-12 DIAGNOSIS — F17200 Nicotine dependence, unspecified, uncomplicated: Secondary | ICD-10-CM | POA: Diagnosis present

## 2011-10-12 DIAGNOSIS — E039 Hypothyroidism, unspecified: Secondary | ICD-10-CM | POA: Diagnosis present

## 2011-10-12 DIAGNOSIS — I2510 Atherosclerotic heart disease of native coronary artery without angina pectoris: Secondary | ICD-10-CM | POA: Diagnosis present

## 2011-10-12 DIAGNOSIS — F411 Generalized anxiety disorder: Secondary | ICD-10-CM | POA: Diagnosis present

## 2011-10-12 DIAGNOSIS — Z8673 Personal history of transient ischemic attack (TIA), and cerebral infarction without residual deficits: Secondary | ICD-10-CM

## 2011-10-12 DIAGNOSIS — I252 Old myocardial infarction: Secondary | ICD-10-CM

## 2011-10-12 DIAGNOSIS — L98499 Non-pressure chronic ulcer of skin of other sites with unspecified severity: Secondary | ICD-10-CM

## 2011-10-12 DIAGNOSIS — J449 Chronic obstructive pulmonary disease, unspecified: Secondary | ICD-10-CM | POA: Diagnosis present

## 2011-10-12 DIAGNOSIS — E119 Type 2 diabetes mellitus without complications: Secondary | ICD-10-CM | POA: Diagnosis present

## 2011-10-12 DIAGNOSIS — K219 Gastro-esophageal reflux disease without esophagitis: Secondary | ICD-10-CM | POA: Diagnosis present

## 2011-10-12 DIAGNOSIS — F329 Major depressive disorder, single episode, unspecified: Secondary | ICD-10-CM | POA: Diagnosis present

## 2011-10-12 DIAGNOSIS — M129 Arthropathy, unspecified: Secondary | ICD-10-CM | POA: Diagnosis present

## 2011-10-12 DIAGNOSIS — Z7902 Long term (current) use of antithrombotics/antiplatelets: Secondary | ICD-10-CM

## 2011-10-12 DIAGNOSIS — Z79899 Other long term (current) drug therapy: Secondary | ICD-10-CM

## 2011-10-12 DIAGNOSIS — Z85528 Personal history of other malignant neoplasm of kidney: Secondary | ICD-10-CM

## 2011-10-12 DIAGNOSIS — E785 Hyperlipidemia, unspecified: Secondary | ICD-10-CM | POA: Diagnosis present

## 2011-10-12 DIAGNOSIS — I10 Essential (primary) hypertension: Secondary | ICD-10-CM | POA: Diagnosis present

## 2011-10-12 DIAGNOSIS — L97409 Non-pressure chronic ulcer of unspecified heel and midfoot with unspecified severity: Secondary | ICD-10-CM | POA: Diagnosis present

## 2011-10-12 HISTORY — PX: FEMORAL-POPLITEAL BYPASS GRAFT: SHX937

## 2011-10-12 HISTORY — PX: INTRAOPERATIVE ARTERIOGRAM: SHX5157

## 2011-10-12 LAB — GLUCOSE, CAPILLARY
Glucose-Capillary: 58 mg/dL — ABNORMAL LOW (ref 70–99)
Glucose-Capillary: 124 mg/dL — ABNORMAL HIGH (ref 70–99)
Glucose-Capillary: 126 mg/dL — ABNORMAL HIGH (ref 70–99)

## 2011-10-12 SURGERY — BYPASS GRAFT FEMORAL-POPLITEAL ARTERY
Anesthesia: General | Site: Leg Upper | Laterality: Left

## 2011-10-12 MED ORDER — DEXTROSE 50 % IV SOLN
INTRAVENOUS | Status: DC | PRN
  Administered 2011-10-12: 25 mL via INTRAVENOUS

## 2011-10-12 MED ORDER — INSULIN ASPART 100 UNIT/ML ~~LOC~~ SOLN: 0.0000 [IU] | Freq: Three times a day (TID) | SUBCUTANEOUS | Status: DC

## 2011-10-12 MED ORDER — LEVOTHYROXINE SODIUM 88 MCG PO TABS
88.0000 ug | ORAL_TABLET | Freq: Every day | ORAL | Status: DC
  Administered 2011-10-13 – 2011-10-14 (×2): 88 ug via ORAL
  Filled 2011-10-12 (×4): qty 1

## 2011-10-12 MED ORDER — GUAIFENESIN-DM 100-10 MG/5ML PO SYRP: 15.0000 mL | ORAL_SOLUTION | ORAL | Status: DC | PRN

## 2011-10-12 MED ORDER — SENNOSIDES-DOCUSATE SODIUM 8.6-50 MG PO TABS
1.0000 | ORAL_TABLET | Freq: Every evening | ORAL | Status: DC | PRN
  Filled 2011-10-12: qty 1

## 2011-10-12 MED ORDER — DEXTROSE 5 % IV SOLN
1.5000 g | Freq: Two times a day (BID) | INTRAVENOUS | Status: AC
  Administered 2011-10-12 – 2011-10-13 (×2): 1.5 g via INTRAVENOUS
  Filled 2011-10-12 (×2): qty 1.5

## 2011-10-12 MED ORDER — SIMVASTATIN 10 MG PO TABS: 10.0000 mg | ORAL_TABLET | Freq: Every day | ORAL | Status: DC

## 2011-10-12 MED ORDER — MAGNESIUM SULFATE 40 MG/ML IJ SOLN
2.0000 g | Freq: Once | INTRAMUSCULAR | Status: AC | PRN
  Filled 2011-10-12: qty 50

## 2011-10-12 MED ORDER — LACTATED RINGERS IV SOLN
INTRAVENOUS | Status: DC | PRN
  Administered 2011-10-12 (×2): via INTRAVENOUS

## 2011-10-12 MED ORDER — LIDOCAINE HCL (CARDIAC) 20 MG/ML IV SOLN
INTRAVENOUS | Status: DC | PRN
  Administered 2011-10-12: 40 mg via INTRAVENOUS

## 2011-10-12 MED ORDER — AMITRIPTYLINE HCL 75 MG PO TABS
75.0000 mg | ORAL_TABLET | Freq: Every day | ORAL | Status: DC
  Administered 2011-10-12 – 2011-10-13 (×2): 75 mg via ORAL
  Filled 2011-10-12 (×3): qty 1

## 2011-10-12 MED ORDER — FERROUS SULFATE 325 (65 FE) MG PO TABS
325.0000 mg | ORAL_TABLET | Freq: Every day | ORAL | Status: DC
  Administered 2011-10-13 – 2011-10-14 (×2): 325 mg via ORAL
  Filled 2011-10-12 (×3): qty 1

## 2011-10-12 MED ORDER — SODIUM CHLORIDE 0.9 % IV SOLN: INTRAVENOUS | Status: DC

## 2011-10-12 MED ORDER — ROCURONIUM BROMIDE 100 MG/10ML IV SOLN
INTRAVENOUS | Status: DC | PRN
  Administered 2011-10-12: 10 mg via INTRAVENOUS
  Administered 2011-10-12: 40 mg via INTRAVENOUS
  Administered 2011-10-12: 10 mg via INTRAVENOUS

## 2011-10-12 MED ORDER — IOHEXOL 300 MG/ML  SOLN
INTRAMUSCULAR | Status: DC | PRN
  Administered 2011-10-12: 30 mL via INTRAVENOUS

## 2011-10-12 MED ORDER — FLEET ENEMA 7-19 GM/118ML RE ENEM: 1.0000 | ENEMA | Freq: Once | RECTAL | Status: AC | PRN

## 2011-10-12 MED ORDER — PHENOL 1.4 % MT LIQD: 1.0000 | OROMUCOSAL | Status: DC | PRN

## 2011-10-12 MED ORDER — PANTOPRAZOLE SODIUM 40 MG PO TBEC: 40.0000 mg | DELAYED_RELEASE_TABLET | Freq: Every day | ORAL | Status: DC

## 2011-10-12 MED ORDER — DEXTROSE 50 % IV SOLN
25.0000 mL | Freq: Once | INTRAVENOUS | Status: AC
  Administered 2011-10-12: 25 mL via INTRAVENOUS

## 2011-10-12 MED ORDER — LACTATED RINGERS IV SOLN
INTRAVENOUS | Status: DC
  Administered 2011-10-12: 09:00:00 via INTRAVENOUS

## 2011-10-12 MED ORDER — ACETAMINOPHEN 325 MG PO TABS: 325.0000 mg | ORAL_TABLET | ORAL | Status: DC | PRN

## 2011-10-12 MED ORDER — GLYCOPYRROLATE 0.2 MG/ML IJ SOLN
INTRAMUSCULAR | Status: DC | PRN
  Administered 2011-10-12: 0.4 mg via INTRAVENOUS

## 2011-10-12 MED ORDER — LABETALOL HCL 5 MG/ML IV SOLN
10.0000 mg | INTRAVENOUS | Status: DC | PRN
  Filled 2011-10-12: qty 4

## 2011-10-12 MED ORDER — POTASSIUM CHLORIDE CRYS ER 20 MEQ PO TBCR: 20.0000 meq | EXTENDED_RELEASE_TABLET | Freq: Once | ORAL | Status: AC | PRN

## 2011-10-12 MED ORDER — ALUM & MAG HYDROXIDE-SIMETH 200-200-20 MG/5ML PO SUSP: 15.0000 mL | ORAL | Status: DC | PRN

## 2011-10-12 MED ORDER — SODIUM CHLORIDE 0.9 % IV SOLN
INTRAVENOUS | Status: DC
  Administered 2011-10-12 (×2): via INTRAVENOUS

## 2011-10-12 MED ORDER — DEXTROSE 5 % IV SOLN
INTRAVENOUS | Status: DC | PRN
  Administered 2011-10-12: 11:00:00 via INTRAVENOUS

## 2011-10-12 MED ORDER — PAPAVERINE HCL 30 MG/ML IJ SOLN
INTRAMUSCULAR | Status: AC
  Filled 2011-10-12: qty 2

## 2011-10-12 MED ORDER — SODIUM CHLORIDE 0.9 % IR SOLN
Status: DC | PRN
  Administered 2011-10-12: 11:00:00

## 2011-10-12 MED ORDER — ACETAMINOPHEN 650 MG RE SUPP: 325.0000 mg | RECTAL | Status: DC | PRN

## 2011-10-12 MED ORDER — HYDRALAZINE HCL 20 MG/ML IJ SOLN
10.0000 mg | INTRAMUSCULAR | Status: DC | PRN
  Filled 2011-10-12: qty 0.5

## 2011-10-12 MED ORDER — PANTOPRAZOLE SODIUM 40 MG PO TBEC
40.0000 mg | DELAYED_RELEASE_TABLET | Freq: Every day | ORAL | Status: DC
  Administered 2011-10-13 – 2011-10-14 (×2): 40 mg via ORAL
  Filled 2011-10-12 (×2): qty 1

## 2011-10-12 MED ORDER — THROMBIN 20000 UNITS EX SOLR
CUTANEOUS | Status: AC
  Filled 2011-10-12: qty 20000

## 2011-10-12 MED ORDER — ONDANSETRON HCL 4 MG/2ML IJ SOLN
INTRAMUSCULAR | Status: DC | PRN
  Administered 2011-10-12: 4 mg via INTRAVENOUS

## 2011-10-12 MED ORDER — HYDROMORPHONE HCL PF 1 MG/ML IJ SOLN
0.2500 mg | INTRAMUSCULAR | Status: DC | PRN
  Administered 2011-10-12: 0.5 mg via INTRAVENOUS
  Administered 2011-10-12: 0.25 mg via INTRAVENOUS
  Administered 2011-10-12: 0.5 mg via INTRAVENOUS

## 2011-10-12 MED ORDER — BISACODYL 10 MG RE SUPP: 10.0000 mg | Freq: Every day | RECTAL | Status: DC | PRN

## 2011-10-12 MED ORDER — METOPROLOL TARTRATE 1 MG/ML IV SOLN: 2.0000 mg | INTRAVENOUS | Status: DC | PRN

## 2011-10-12 MED ORDER — PHENYLEPHRINE HCL 10 MG/ML IJ SOLN
10.0000 mg | INTRAVENOUS | Status: DC | PRN
  Administered 2011-10-12: 10 ug/min via INTRAVENOUS

## 2011-10-12 MED ORDER — HYDROMORPHONE HCL PF 1 MG/ML IJ SOLN
INTRAMUSCULAR | Status: AC
  Filled 2011-10-12: qty 1

## 2011-10-12 MED ORDER — MORPHINE SULFATE 2 MG/ML IJ SOLN
2.0000 mg | INTRAMUSCULAR | Status: DC | PRN
  Administered 2011-10-12: 4 mg via INTRAVENOUS
  Administered 2011-10-12: 2 mg via INTRAVENOUS
  Administered 2011-10-12: 4 mg via INTRAVENOUS
  Administered 2011-10-13 – 2011-10-14 (×5): 2 mg via INTRAVENOUS
  Filled 2011-10-12 (×2): qty 1
  Filled 2011-10-12: qty 2
  Filled 2011-10-12: qty 1
  Filled 2011-10-12: qty 2
  Filled 2011-10-12 (×3): qty 1

## 2011-10-12 MED ORDER — METFORMIN HCL 850 MG PO TABS
850.0000 mg | ORAL_TABLET | Freq: Two times a day (BID) | ORAL | Status: DC
  Administered 2011-10-13 – 2011-10-14 (×3): 850 mg via ORAL
  Filled 2011-10-12 (×5): qty 1

## 2011-10-12 MED ORDER — OXYCODONE-ACETAMINOPHEN 5-325 MG PO TABS
1.0000 | ORAL_TABLET | ORAL | Status: DC | PRN
  Administered 2011-10-12: 2 via ORAL
  Administered 2011-10-12 – 2011-10-13 (×2): 1 via ORAL
  Administered 2011-10-13: 2 via ORAL
  Administered 2011-10-13 (×5): 1 via ORAL
  Administered 2011-10-14: 2 via ORAL
  Filled 2011-10-12: qty 1
  Filled 2011-10-12: qty 2
  Filled 2011-10-12: qty 1
  Filled 2011-10-12 (×2): qty 2
  Filled 2011-10-12 (×5): qty 1

## 2011-10-12 MED ORDER — ASPIRIN 325 MG PO TABS
325.0000 mg | ORAL_TABLET | Freq: Every day | ORAL | Status: DC
  Administered 2011-10-13 – 2011-10-14 (×2): 325 mg via ORAL
  Filled 2011-10-12 (×3): qty 1

## 2011-10-12 MED ORDER — IOHEXOL 300 MG/ML  SOLN
INTRAMUSCULAR | Status: AC
  Filled 2011-10-12: qty 1

## 2011-10-12 MED ORDER — FESOTERODINE FUMARATE ER 8 MG PO TB24
8.0000 mg | ORAL_TABLET | Freq: Every day | ORAL | Status: DC
  Administered 2011-10-14: 8 mg via ORAL
  Filled 2011-10-12 (×3): qty 1

## 2011-10-12 MED ORDER — ONDANSETRON HCL 4 MG/2ML IJ SOLN: 4.0000 mg | Freq: Four times a day (QID) | INTRAMUSCULAR | Status: DC | PRN

## 2011-10-12 MED ORDER — ALBUMIN HUMAN 5 % IV SOLN
INTRAVENOUS | Status: DC | PRN
  Administered 2011-10-12: 12:00:00 via INTRAVENOUS

## 2011-10-12 MED ORDER — THROMBIN 20000 UNITS EX SOLR
CUTANEOUS | Status: DC | PRN
  Administered 2011-10-12: 12:00:00 via TOPICAL

## 2011-10-12 MED ORDER — EPHEDRINE SULFATE 50 MG/ML IJ SOLN
INTRAMUSCULAR | Status: DC | PRN
  Administered 2011-10-12 (×5): 5 mg via INTRAVENOUS

## 2011-10-12 MED ORDER — NEOSTIGMINE METHYLSULFATE 1 MG/ML IJ SOLN
INTRAMUSCULAR | Status: DC | PRN
  Administered 2011-10-12: 3 mg via INTRAVENOUS

## 2011-10-12 MED ORDER — PROPOFOL 10 MG/ML IV EMUL
INTRAVENOUS | Status: DC | PRN
  Administered 2011-10-12: 120 mg via INTRAVENOUS

## 2011-10-12 MED ORDER — PRAVASTATIN SODIUM 20 MG PO TABS
20.0000 mg | ORAL_TABLET | Freq: Every day | ORAL | Status: DC
  Administered 2011-10-12 – 2011-10-13 (×2): 20 mg via ORAL
  Filled 2011-10-12 (×3): qty 1

## 2011-10-12 MED ORDER — LORAZEPAM 1 MG PO TABS
1.0000 mg | ORAL_TABLET | Freq: Three times a day (TID) | ORAL | Status: DC
  Administered 2011-10-12 – 2011-10-14 (×6): 1 mg via ORAL
  Filled 2011-10-12 (×6): qty 1

## 2011-10-12 MED ORDER — MIDAZOLAM HCL 5 MG/5ML IJ SOLN
INTRAMUSCULAR | Status: DC | PRN
  Administered 2011-10-12: 1.5 mg via INTRAVENOUS

## 2011-10-12 MED ORDER — INSULIN GLARGINE 100 UNIT/ML ~~LOC~~ SOLN
60.0000 [IU] | Freq: Every day | SUBCUTANEOUS | Status: DC
  Administered 2011-10-12: 30 [IU] via SUBCUTANEOUS
  Administered 2011-10-13: 60 [IU] via SUBCUTANEOUS

## 2011-10-12 MED ORDER — PROMETHAZINE HCL 25 MG PO TABS: 25.0000 mg | ORAL_TABLET | Freq: Four times a day (QID) | ORAL | Status: DC | PRN

## 2011-10-12 MED ORDER — MECLIZINE HCL 25 MG PO TABS
25.0000 mg | ORAL_TABLET | Freq: Two times a day (BID) | ORAL | Status: DC | PRN
  Filled 2011-10-12: qty 1

## 2011-10-12 MED ORDER — MELOXICAM 7.5 MG PO TABS
7.5000 mg | ORAL_TABLET | Freq: Every day | ORAL | Status: DC
  Administered 2011-10-13 – 2011-10-14 (×2): 7.5 mg via ORAL
  Filled 2011-10-12 (×3): qty 1

## 2011-10-12 MED ORDER — METOPROLOL TARTRATE 25 MG PO TABS
25.0000 mg | ORAL_TABLET | Freq: Two times a day (BID) | ORAL | Status: DC
  Administered 2011-10-13 – 2011-10-14 (×3): 25 mg via ORAL
  Filled 2011-10-12 (×5): qty 1

## 2011-10-12 MED ORDER — HEPARIN SODIUM (PORCINE) 1000 UNIT/ML IJ SOLN
INTRAMUSCULAR | Status: DC | PRN
  Administered 2011-10-12: 7000 [IU] via INTRAVENOUS

## 2011-10-12 MED ORDER — DOCUSATE SODIUM 100 MG PO CAPS
100.0000 mg | ORAL_CAPSULE | Freq: Every day | ORAL | Status: DC
  Administered 2011-10-13: 100 mg via ORAL
  Filled 2011-10-12 (×2): qty 1

## 2011-10-12 MED ORDER — ONDANSETRON HCL 4 MG/2ML IJ SOLN: 4.0000 mg | Freq: Once | INTRAMUSCULAR | Status: DC | PRN

## 2011-10-12 MED ORDER — DOPAMINE-DEXTROSE 3.2-5 MG/ML-% IV SOLN: 3.0000 ug/kg/min | INTRAVENOUS | Status: DC

## 2011-10-12 MED ORDER — MIDAZOLAM HCL 2 MG/2ML IJ SOLN
INTRAMUSCULAR | Status: AC
  Administered 2011-10-12: 1 mg
  Filled 2011-10-12: qty 2

## 2011-10-12 MED ORDER — CLOPIDOGREL BISULFATE 75 MG PO TABS
75.0000 mg | ORAL_TABLET | Freq: Every day | ORAL | Status: DC
  Administered 2011-10-13 – 2011-10-14 (×2): 75 mg via ORAL
  Filled 2011-10-12 (×3): qty 1

## 2011-10-12 MED ORDER — SODIUM CHLORIDE 0.9 % IV SOLN: 500.0000 mL | Freq: Once | INTRAVENOUS | Status: AC | PRN

## 2011-10-12 MED ORDER — FENTANYL CITRATE 0.05 MG/ML IJ SOLN
INTRAMUSCULAR | Status: DC | PRN
  Administered 2011-10-12: 100 ug via INTRAVENOUS
  Administered 2011-10-12: 25 ug via INTRAVENOUS
  Administered 2011-10-12 (×2): 50 ug via INTRAVENOUS
  Administered 2011-10-12: 25 ug via INTRAVENOUS

## 2011-10-12 MED ORDER — 0.9 % SODIUM CHLORIDE (POUR BTL) OPTIME
TOPICAL | Status: DC | PRN
  Administered 2011-10-12: 2000 mL

## 2011-10-12 SURGICAL SUPPLY — 67 items
ADH SKN CLS APL DERMABOND .7 (GAUZE/BANDAGES/DRESSINGS) ×4
BANDAGE ESMARK 6X9 LF (GAUZE/BANDAGES/DRESSINGS) IMPLANT
BNDG CMPR 9X6 STRL LF SNTH (GAUZE/BANDAGES/DRESSINGS)
BNDG ESMARK 6X9 LF (GAUZE/BANDAGES/DRESSINGS)
CANISTER SUCTION 2500CC (MISCELLANEOUS) ×3 IMPLANT
CANNULA VESSEL W/WING WO/VALVE (CANNULA) ×1 IMPLANT
CLIP TI MEDIUM 24 (CLIP) ×3 IMPLANT
CLIP TI WIDE RED SMALL 24 (CLIP) ×3 IMPLANT
CLOTH BEACON ORANGE TIMEOUT ST (SAFETY) ×3 IMPLANT
CORONARY SUCKER SOFT TIP 10052 (MISCELLANEOUS) IMPLANT
COVER SURGICAL LIGHT HANDLE (MISCELLANEOUS) ×3 IMPLANT
CUFF TOURNIQUET SINGLE 24IN (TOURNIQUET CUFF) IMPLANT
CUFF TOURNIQUET SINGLE 34IN LL (TOURNIQUET CUFF) IMPLANT
CUFF TOURNIQUET SINGLE 44IN (TOURNIQUET CUFF) IMPLANT
DECANTER SPIKE VIAL GLASS SM (MISCELLANEOUS) ×1 IMPLANT
DERMABOND ADVANCED (GAUZE/BANDAGES/DRESSINGS) ×2
DERMABOND ADVANCED .7 DNX12 (GAUZE/BANDAGES/DRESSINGS) IMPLANT
DRAIN SNY WOU (WOUND CARE) IMPLANT
DRAPE WARM FLUID 44X44 (DRAPE) ×3 IMPLANT
DRAPE X-RAY CASS 24X20 (DRAPES) ×1 IMPLANT
DRSG COVADERM 4X10 (GAUZE/BANDAGES/DRESSINGS) IMPLANT
DRSG COVADERM 4X6 (GAUZE/BANDAGES/DRESSINGS) ×1 IMPLANT
DRSG COVADERM 4X8 (GAUZE/BANDAGES/DRESSINGS) IMPLANT
ELECT REM PT RETURN 9FT ADLT (ELECTROSURGICAL) ×9
ELECTRODE REM PT RTRN 9FT ADLT (ELECTROSURGICAL) ×2 IMPLANT
EVACUATOR SILICONE 100CC (DRAIN) IMPLANT
FELT TEFLON 6X6 (MISCELLANEOUS) ×1 IMPLANT
GLOVE BIO SURGEON STRL SZ7.5 (GLOVE) ×3 IMPLANT
GLOVE BIOGEL PI IND STRL 6.5 (GLOVE) IMPLANT
GLOVE BIOGEL PI IND STRL 7.5 (GLOVE) IMPLANT
GLOVE BIOGEL PI INDICATOR 6.5 (GLOVE) ×1
GLOVE BIOGEL PI INDICATOR 7.5 (GLOVE) ×2
GLOVE SURG SS PI 6.5 STRL IVOR (GLOVE) ×1 IMPLANT
GLOVE SURG SS PI 7.5 STRL IVOR (GLOVE) ×2 IMPLANT
GOWN PREVENTION PLUS XLARGE (GOWN DISPOSABLE) ×5 IMPLANT
GOWN STRL NON-REIN LRG LVL3 (GOWN DISPOSABLE) ×6 IMPLANT
GRAFT PROPATEN W/RING 6X80X60 (Vascular Products) ×1 IMPLANT
KIT BASIN OR (CUSTOM PROCEDURE TRAY) ×3 IMPLANT
KIT ROOM TURNOVER OR (KITS) ×3 IMPLANT
MARKER GRAFT CORONARY BYPASS (MISCELLANEOUS) IMPLANT
NS IRRIG 1000ML POUR BTL (IV SOLUTION) ×6 IMPLANT
PACK PERIPHERAL VASCULAR (CUSTOM PROCEDURE TRAY) ×3 IMPLANT
PAD ARMBOARD 7.5X6 YLW CONV (MISCELLANEOUS) ×5 IMPLANT
PADDING CAST COTTON 6X4 STRL (CAST SUPPLIES) IMPLANT
SET COLLECT BLD 21X3/4 12 (NEEDLE) ×1 IMPLANT
SPONGE GAUZE 4X4 12PLY (GAUZE/BANDAGES/DRESSINGS) ×3 IMPLANT
SPONGE SURGIFOAM ABS GEL 100 (HEMOSTASIS) ×1 IMPLANT
STAPLER VISISTAT 35W (STAPLE) IMPLANT
STOPCOCK 4 WAY LG BORE MALE ST (IV SETS) ×1 IMPLANT
SUT PROLENE 5 0 C 1 24 (SUTURE) ×3 IMPLANT
SUT PROLENE 6 0 CC (SUTURE) ×6 IMPLANT
SUT PROLENE 7 0 BV 1 (SUTURE) IMPLANT
SUT PROLENE 7 0 BV1 MDA (SUTURE) ×1 IMPLANT
SUT SILK 2 0 SH (SUTURE) ×3 IMPLANT
SUT SILK 3 0 (SUTURE)
SUT SILK 3-0 18XBRD TIE 12 (SUTURE) IMPLANT
SUT VIC AB 2-0 CTX 36 (SUTURE) ×6 IMPLANT
SUT VIC AB 3-0 SH 27 (SUTURE) ×6
SUT VIC AB 3-0 SH 27X BRD (SUTURE) ×4 IMPLANT
SUT VIC AB 4-0 PS2 27 (SUTURE) IMPLANT
TAPE UMBILICAL COTTON 1/8X30 (MISCELLANEOUS) IMPLANT
TOWEL OR 17X24 6PK STRL BLUE (TOWEL DISPOSABLE) ×6 IMPLANT
TOWEL OR 17X26 10 PK STRL BLUE (TOWEL DISPOSABLE) ×6 IMPLANT
TRAY FOLEY CATH 14FRSI W/METER (CATHETERS) ×3 IMPLANT
TUBING EXTENTION W/L.L. (IV SETS) ×1 IMPLANT
UNDERPAD 30X30 INCONTINENT (UNDERPADS AND DIAPERS) ×3 IMPLANT
WATER STERILE IRR 1000ML POUR (IV SOLUTION) ×3 IMPLANT

## 2011-10-12 NOTE — Preoperative (Signed)
Beta Blockers   Reason not to administer Beta Blockers:Not Applicable 

## 2011-10-12 NOTE — Anesthesia Procedure Notes (Signed)
Procedure Name: Intubation Date/Time: 10/12/2011 10:24 AM Performed by: Julianne Rice K Pre-anesthesia Checklist: Emergency Drugs available, Patient identified, Timeout performed, Suction available and Patient being monitored Patient Re-evaluated:Patient Re-evaluated prior to inductionOxygen Delivery Method: Circle system utilized Preoxygenation: Pre-oxygenation with 100% oxygen Intubation Type: IV induction Ventilation: Mask ventilation without difficulty Laryngoscope Size: Miller and 2 Grade View: Grade II Tube type: Oral Tube size: 8.0 mm Number of attempts: 1 Airway Equipment and Method: Stylet Placement Confirmation: ETT inserted through vocal cords under direct vision,  breath sounds checked- equal and bilateral and positive ETCO2 Secured at: 22 cm Tube secured with: Tape Dental Injury: Teeth and Oropharynx as per pre-operative assessment

## 2011-10-12 NOTE — Progress Notes (Addendum)
Report to Dr. Noreene Larsson, [facility]- 66, pt. Asymptomatic,  Off metoprolol.  No new orders.

## 2011-10-12 NOTE — Interval H&P Note (Signed)
History and Physical Interval Note:  10/12/2011 9:12 AM  April Branch  has presented today for surgery, with the diagnosis of Peripheral Vascular Disease  The various methods of treatment have been discussed with the patient and family. After consideration of risks, benefits and other options for treatment, the patient has consented to  Procedure(s) (LRB): BYPASS GRAFT FEMORAL-POPLITEAL ARTERY (Left) as a surgical intervention .  The patient's history has been reviewed, patient examined, no change in status, stable for surgery.  I have reviewed the patient's chart and labs.  Questions were answered to the patient's satisfaction.     Kaeleigh Westendorf E

## 2011-10-12 NOTE — H&P (View-Only) (Signed)
[facility] [place] HISTORY AND PHYSICAL   History of Present Illness:  66 year old female patient seen for lower extremity ABIs status post a occluded left femoral popliteal bypass done in April 2012. Patient has continued complaints of claudication that have somewhat worsened since she was seen last. The patient has wavered whether or not to have any further intervention.  Patient denies rest pain and has no open wounds. General recent arteriogram which shows that she would be a candidate for a left femoral to below-knee popliteal bypass. She states her claudication is been getting worse and has decided at this point undergo a redo left femoral to below-knee popliteal bypass. He was recently seen by Dr. Eden Emms who evaluated her for cardiac risk. She is thought to be low risk. She is on Plavix and aspirin   Past Medical History  Diagnosis Date  . Hyperlipidemia   . Arthritis   . Hypothyroidism   . GERD (gastroesophageal reflux disease)   . Anxiety   . Diabetes mellitus age 40    insulin dependent  . Peripheral vascular disease   . Lumbar disc disease   . Insulin dependent type 2 diabetes mellitus, controlled   . GERD 07/20/2006  . CAD (coronary artery disease)   . CAD (coronary artery disease) of bypass graft   . Vertigo   . History of transient ischemic attack (TIA)   . Myocardial infarction     AGE 28    . Anginal pain   . Hypertension   . Depression   . COPD 12/14/2008    PATIENT DENIES    . Stroke     3 MINI STROKES  RT SIDED WEAKNESS  . Personal history of malignant neoplasm of kidney 12/14/2008    Laparoscopic biopsy and cryoablation 7/08 Dr. Normajean Baxter   . Headache   . 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 Neuromuscular disorder     PERIPHERAL NEUROPATHY  . Anemia     Past Surgical History  Procedure Date  . Appendectomy   . Abdominal hysterectomy   . Knee surgery   . Cholecystectomy   . Lung surgery     lung nodule removed from the  right side  . Laparascopic cryoablation of left kidney 08/2006    Dr. Sherre Poot for renal cell cancer  . Bladder suspension   . Shoulder surgery   . Foot surgery   . Pr vein bypass graft,aorto-fem-pop 05/27/2010  . Cardiac catheterization      Social History History  Substance Use Topics  . Smoking status: Current Everyday Smoker -- 50 years    Types: Cigarettes  . Smokeless tobacco: Not on file  . Alcohol Use: No    Family History Family History  Problem Relation Age of Onset  . Heart disease Mother   . Heart disease Father   . Heart disease Sister   . Lung cancer Mother   . Lung cancer Father     Allergies  Allergies  Allergen Reactions  . Niacin-Simvastatin Er Hives     Current Outpatient Prescriptions  Medication Sig Dispense Refill  . amitriptyline (ELAVIL) 75 MG tablet Take 75 mg by mouth at bedtime.      . clopidogrel (PLAVIX) 75 MG tablet Take 75 mg by mouth daily.        . cyclobenzaprine (FLEXERIL) 10 MG tablet Take 10 mg by mouth every 8 (eight) hours as needed. For muscle spasms      .  ferrous sulfate (IRON SUPPLEMENT) 325 (65 FE) MG tablet Take 325 mg by mouth daily with breakfast.      . HYDROcodone-acetaminophen (NORCO) 10-325 MG per tablet Take 1-2 tablets by mouth every 6 (six) hours as needed. For pain      . insulin aspart (NOVOLOG) 100 UNIT/ML injection Inject 4-6 Units into the skin 2 (two) times daily.       . insulin glargine (LANTUS) 100 UNIT/ML injection Inject 60 Units into the skin at bedtime.        . lansoprazole (PREVACID) 30 MG capsule Take 30 mg by mouth daily.        Marland Kitchen levothyroxine (SYNTHROID, LEVOTHROID) 88 MCG tablet Take 88 mcg by mouth daily.      Marland Kitchen LORazepam (ATIVAN) 1 MG tablet Take 1 mg by mouth every 8 (eight) hours.      . meclizine (ANTIVERT) 25 MG tablet Take 25 mg by mouth 2 (two) times daily as needed. dizziness      . meloxicam (MOBIC) 7.5 MG tablet Take 1 tablet (7.5 mg total) by mouth daily.  14 tablet  0  . metFORMIN  (GLUCOPHAGE) 850 MG tablet Take 850 mg by mouth 2 (two) times daily with a meal.      . metoprolol tartrate (LOPRESSOR) 25 MG tablet Take 25 mg by mouth 2 (two) times daily.      . pravastatin (PRAVACHOL) 20 MG tablet Take 20 mg by mouth daily.      . promethazine (PHENERGAN) 25 MG tablet Take 25 mg by mouth every 6 (six) hours as needed. For nausea/vomiting      . tolterodine (DETROL LA) 4 MG 24 hr capsule Take 4 mg by mouth daily.        Marland Kitchen DISCONTD: Gabapentin (NEURONTIN PO) Take by mouth. New medicine, pt doesn't remember dosage        ROS:   General:  No weight loss, Fever, chills  HEENT: No recent headaches, no nasal bleeding, no visual changes, no sore throat  Neurologic: No dizziness, blackouts, seizures. No recent symptoms of stroke or mini- stroke. No recent episodes of slurred speech, or temporary blindness.  Cardiac: No recent episodes of chest pain/pressure, no shortness of breath at rest.  No shortness of breath with exertion.  Denies history of atrial fibrillation or irregular heartbeat  Vascular: No history of rest pain in feet.  No history of claudication.  No history of non-healing ulcer, No history of DVT   Pulmonary: No home oxygen, no productive cough, no hemoptysis,  No asthma or wheezing  Musculoskeletal:  [ ]  Arthritis, [ ]  Low back pain,  [ ]  Joint pain  Hematologic:No history of hypercoagulable state.  No history of easy bleeding.  No history of anemia  Gastrointestinal: No hematochezia or melena,  No gastroesophageal reflux, no trouble swallowing  Urinary: [ ]  chronic Kidney disease, [ ]  on HD - [ ]  MWF or [ ]  TTHS, [ ]  Burning with urination, [ ]  Frequent urination, [ ]  Difficulty urinating;   Skin: No rashes  Psychological: No history of anxiety,  No history of depression   Physical Examination  Filed Vitals:   10/08/11 1312  BP: 145/68  Pulse: 79  Resp: 16  Height: 5\' 1"  (1.549 m)  Weight: 142 lb (64.411 kg)  SpO2: 100%    Body mass index is  26.83 kg/(m^2).  General:  Alert and oriented, no acute distress HEENT: Normal Neck: No bruit or JVD Pulmonary: Clear to auscultation bilaterally Cardiac:  Regular Rate and Rhythm without murmur Abdomen: Soft, non-tender, non-distended, no mass, no scars Skin: No rash, prior scars left groin and leg from previous bypass Extremity Pulses:  2+ radial, brachial, femoral, absent dorsalis pedis, posterior tibial pulses bilaterally Musculoskeletal: No deformity or edema  Neurologic: Upper and lower extremity motor 5/5 and symmetric  DATA: Patent below knee popliteal artery left leg with three-vessel runoff, occluded left femoral above-knee pop   ASSESSMENT: Disabling claudication left lower extremity   PLAN:  Redo left femoral to below-knee popliteal bypass on Monday, 10/12/2011. Risks benefits possible complications and procedure details were explained the patient today including but not limited to bleeding infection graft thrombosis possible limb loss she understands and agrees to proceed.  It was also emphasized to the patient that the operation is being done for claudication symptoms in the left leg. It will not do anything to improve her restless leg type symptoms in the left leg. It well not do anything to improve the numbness tingling or aching in her left foot. Patient was given a prescription today for Vicodin for some intermittent right hip pain that she has had recently no further refill.  Fabienne Bruns, MD [facility] [place] Office: [phone] Pager: [phone]

## 2011-10-12 NOTE — Anesthesia Postprocedure Evaluation (Signed)
  Anesthesia Post-op Note  Patient: April Branch  Procedure(s) Performed: Procedure(s) (LRB): BYPASS GRAFT FEMORAL-POPLITEAL ARTERY (Left) INTRA OPERATIVE ARTERIOGRAM (Left)  Patient Location: PACU  Anesthesia Type: General  Level of Consciousness: awake  Airway and Oxygen Therapy: Patient Spontanous Breathing and Patient connected to nasal cannula oxygen  Post-op Pain: mild  Post-op Assessment: Post-op Vital signs reviewed and Patient's Cardiovascular Status Stable  Post-op Vital Signs: stable  Complications: No apparent anesthesia complications

## 2011-10-12 NOTE — Progress Notes (Signed)
MEDICATION RELATED CONSULT NOTE - INITIAL   Pharmacy Consult for antibiotic renal dose adjustment  Allergies  Allergen Reactions  . Niacin-Simvastatin Er Hives    Patient Measurements: Height: 5' 1.02" (155 cm) Weight: 141 lb 15.6 oz (64.4 kg) IBW/kg (Calculated) : 47.86   Vital Signs: Temp: 97.3 F (36.3 C) (08/19 1632) Temp src: Oral (08/19 0706) BP: 133/55 mmHg (08/19 1622) Pulse Rate: 75  (08/19 1630) Intake/Output from previous day:   Intake/Output from this shift: Total I/O In: 2550 [I.V.:2300; IV Piggyback:250] Out: 925 [Urine:675; Blood:250]  Labs: No results found for this basename: WBC:3,HGB:3,HCT:3,PLT:3,APTT:3;INR:3,CREATININE:3,LABCREA:3,CREATININE:3,CREAT24HRUR:3,MG:3,PHOS:3,ALBUMIN:3,PROT:3,ALBUMIN:3,AST:3,ALT:3,ALKPHOS:3,BILITOT:3,BILIDIR:3,IBILI:3 in the last 72 hours Estimated Creatinine Clearance: 52.6 ml/min (by C-G formula based on Cr of 0.93).   Microbiology: Recent Results (from the past 720 hour(s))  SURGICAL PCR SCREEN     Status: Normal   Collection Time   10/05/11 10:46 AM      Component Value Range Status Comment   MRSA, PCR NEGATIVE  NEGATIVE Final    Staphylococcus aureus NEGATIVE  NEGATIVE Final     Medical History: Past Medical History  Diagnosis Date  . Hyperlipidemia   . Arthritis   . Hypothyroidism   . GERD (gastroesophageal reflux disease)   . Anxiety   . Diabetes mellitus age 66    insulin dependent  . Peripheral vascular disease   . Lumbar disc disease   . Insulin dependent type 2 diabetes mellitus, controlled   . GERD 07/20/2006  . CAD (coronary artery disease)   . CAD (coronary artery disease) of bypass graft   . Vertigo   . History of transient ischemic attack (TIA)   . Myocardial infarction     AGE 66    . Anginal pain   . Hypertension   . Depression   . COPD 12/14/2008    PATIENT DENIES    . Stroke     3 MINI STROKES  RT SIDED WEAKNESS  . Personal history of malignant neoplasm of kidney 12/14/2008   Laparoscopic biopsy and cryoablation 7/08 Dr. Normajean Baxter   . Headache   . 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 Neuromuscular disorder     PERIPHERAL NEUROPATHY  . Anemia     Assessment: 66 YOF s/p Redo left femoral to below-knee popliteal bypass today. On zinacef 1.5g IV Q 12hrs x 2 doses for surgical prophylaxis. Pharmacy is consulted to adjust antibiotics dosing base on renal function. Most recent scr = 0.93 on 64/12, est. crcl ~ 33ml/min. Pt. Received 1.5g zinacef at 1030 prior to surgery. Current dosage is ok for renal function, no adjustment is needed.    Goal of Therapy:  Prevent post-op infection  Plan:  - No change for Zinacef dosing and schedule - Schedule post-op dose at 2000 - Pharmacy will sign off, please re-consult, if further antibiotics dosing is needed.  Thanks!  Bayard Hugger, PharmD, BCPS  Clinical Pharmacist  Pager: [phone]  64/19/2013,5:34 PM

## 2011-10-12 NOTE — Anesthesia Preprocedure Evaluation (Addendum)
Anesthesia Evaluation  Patient identified by MRN, date of birth, ID band Patient awake    Reviewed: Allergy & Precautions, H&P , NPO status , Patient's Chart, lab work & pertinent test results, reviewed documented beta blocker date and time   Airway Mallampati: II      Dental  (+) Edentulous Upper and Edentulous Lower   Pulmonary COPDformer smoker,  breath sounds clear to auscultation        Cardiovascular hypertension, Pt. on medications and Pt. on home beta blockers + angina with exertion + CAD and + Past MI Rhythm:Regular Rate:Normal     Neuro/Psych  Headaches, Anxiety Depression  Neuromuscular disease CVA, No Residual Symptoms    GI/Hepatic GERD-  Medicated and Controlled,  Endo/Other  Well Controlled, Type 2, Insulin DependentHypothyroidism   Renal/GU Renal InsufficiencyRenal disease     Musculoskeletal negative musculoskeletal ROS (+)   Abdominal   Peds  Hematology negative hematology ROS (+)   Anesthesia Other Findings   Reproductive/Obstetrics                          Anesthesia Physical Anesthesia Plan  ASA: III  Anesthesia Plan: General   Post-op Pain Management:    Induction: Intravenous  Airway Management Planned: Oral ETT  Additional Equipment: Arterial line  Intra-op Plan:   Post-operative Plan: Extubation in OR  Informed Consent: I have reviewed the patients History and Physical, chart, labs and discussed the procedure including the risks, benefits and alternatives for the proposed anesthesia with the patient or authorized representative who has indicated his/her understanding and acceptance.   Dental advisory given  Plan Discussed with: CRNA, Surgeon and Anesthesiologist  Anesthesia Plan Comments: (PVD S/P L. Fem-Pop 05/2010 Type 2 DM glucose 56 given 1/2 amp D50 at 0900. htn H/O smoking H/O TIAs bilat noncritical carotid disease Nonobstructive CAD by cath  05/11/2011 nl LV function  Plan GA with art line  Kipp Brood, MD)       Anesthesia Quick Evaluation

## 2011-10-12 NOTE — Transfer of Care (Signed)
Immediate Anesthesia Transfer of Care Note  Patient: April Branch  Procedure(s) Performed: Procedure(s) (LRB): BYPASS GRAFT FEMORAL-POPLITEAL ARTERY (Left) INTRA OPERATIVE ARTERIOGRAM (Left)  Patient Location: PACU  Anesthesia Type: General  Level of Consciousness: awake, alert  and oriented  Airway & Oxygen Therapy: Patient Spontanous Breathing and Patient connected to nasal cannula oxygen  Post-op Assessment: Report given to PACU RN, Post -op Vital signs reviewed and stable, Patient moving all extremities and Patient able to stick tongue midline  Post vital signs: Reviewed and stable  Complications: No apparent anesthesia complications

## 2011-10-12 NOTE — Op Note (Signed)
Procedure: Left femoral to below knee popliteal bypass with Propaten, angiogram  Preoperative diagnosis: Claudication  With heel ulcer  Postoperative diagnosis: Same  Anesthesia: General  Asst.: Lianne Cure, PA-c  Operative findings:     6 mm ring supported PTFE, 3 vessel runoff, DP pulse at end of case  Operative details: After obtaining informed consent, the patient was taken to the operating room. The patient was placed in supine position on the operating room table. After induction of general anesthesia and endotracheal intubation, a Foley catheter was placed. Next, the patient's entire left lower extremity was prepped and draped in the usual sterile fashion.      A longitudinal incision was then made in the left groin and carried down through the subcutaneous tissues to expose the left common femoral artery. The common femoral artery was dissected free circumferentially. There was a pulse within the common femoral artery. The distal external iliac artery was dissected free circumferentially underneath the inguinal ligament.  A vessel loop was also placed around the distal external iliac artery. The profunda femoris and superficial femoral arteries were also dissected free circumferentially as well and vessel loops placed around these.  A small laceration was made in the profunda while dissecting scar tissue.  This was repaired with a 7 0 prolene pledgeted stitch. The artery overall was soft in character.  An incision was made on the medial aspect of the leg below the knee.  The incision was deepened and the below knee popliteal space was entered.  The popliteal artery was dissected free circumferentially.  It was calcified in islands but was clampable. A tunnel was then created between the heads of the gastrocnemius muscle subsartorial up to the groin.  The patient was given 7000 units of heparin.  After appropriate circulation time, the distal left external iliac artery was controlled with  a vessel loop. The profunda was controlled with a vessel loop and the SFA controlled with a fistula clamp.   A longitudinal opening was made in the common femoral artery on its anterior surface.  The vessel was thickened but soft.  A 6 mm ring supported Propaten graft was then brought up on the operative field and spatulated.  The graft was then sewn end of graft to side of artery using a running 5 0 Prolene.  The graft was clamped just after its origin.  Flow was restored to the native SFA and profunda.  The anastomosis was packed with thrombin and gelfoam.  The graft was then brought through the subsartorial tunnel down to the below-knee popliteal artery after marking for orientation. The below-knee popliteal artery was controlled proximally with a vessel loop and distally with a Henley clamp. A longitudinal opening was made in the distal below-knee popliteal artery in an area that was fairly free of calcification. The graft was then cut to length and spatulated and sewn end of graft to side of artery using running 6-0 Prolene suture.  At completion of the anastomosis everything was forebled backbled and thoroughly flushed. The remainder of the anastomosis was completed and all clamps were removed restoring pulsatile flow to the below-knee popliteal artery.  The patient had good PT and DP doppler flow and there was a palpable DP pulse.    An arteriogram was performed by placing a 21 g needle in the proximal graft. The arteriogram was obtained with inflow occlusion.  This showed a patent distal anastomosis with 3 vessel runoff.  The needle was removed and hemostasis obtained with a 6  0 prolene.    Each anastomosis was then packed with thrombin and gelfoam until hemostasis was obtained.  The wounds were then irrigated and the gelfoam removed.  After hemostasis was obtained, the deep layers and subcutaneous layers of the below-knee popliteal incision were closed with running 3-0 Vicryl suture. The skin was closed  with a 4 0 vicryl subcuticular stitch. The groin was inspected and found to be hemostatic. This was then closed in multiple layers of running 2 0 and 3-0 Vicryl suture and 4-0 subcuticular stitch. Dermabond was applied to the groin incision.  The patient tolerated the procedure well and there were no complications. Instrument sponge and needle counts correct in the case. Patient was taken to the recovery in stable condition.  Fabienne Bruns, MD [facility] Office: [phone] Pager: [phone]

## 2011-10-12 NOTE — Progress Notes (Signed)
Pt. April Branch, but states that she was told to hold Metoprolol maybe one month ago or more.  She isn't sure who told her to stop(hold) but she reports that she hasn't taken in some time .

## 2011-10-13 ENCOUNTER — Encounter (HOSPITAL_COMMUNITY): Payer: Self-pay | Admitting: Vascular Surgery

## 2011-10-13 LAB — CBC
HCT: 30.1 % — ABNORMAL LOW (ref 36.0–46.0)
Hemoglobin: 10 g/dL — ABNORMAL LOW (ref 12.0–15.0)
MCH: 27.9 pg (ref 26.0–34.0)
MCHC: 33.2 g/dL (ref 30.0–36.0)
MCV: 83.8 fL (ref 78.0–100.0)
Platelets: 220 10*3/uL (ref 150–400)
RBC: 3.59 MIL/uL — ABNORMAL LOW (ref 3.87–5.11)
RDW: 14.9 % (ref 11.5–15.5)
WBC: 8.9 10*3/uL (ref 4.0–10.5)

## 2011-10-13 LAB — BASIC METABOLIC PANEL WITH GFR
BUN: 8 mg/dL (ref 6–23)
CO2: 26 meq/L (ref 19–32)
Calcium: 8.7 mg/dL (ref 8.4–10.5)
Chloride: 107 meq/L (ref 96–112)
Creatinine, Ser: 0.7 mg/dL (ref 0.50–1.10)
GFR calc Af Amer: >90 mL/min
GFR calc non Af Amer: 90 mL/min — ABNORMAL LOW
Glucose, Bld: 167 mg/dL — ABNORMAL HIGH (ref 70–99)
Potassium: 3.7 meq/L (ref 3.5–5.1)
Sodium: 142 meq/L (ref 135–145)

## 2011-10-13 LAB — GLUCOSE, CAPILLARY
Glucose-Capillary: 106 mg/dL — ABNORMAL HIGH (ref 70–99)
Glucose-Capillary: 129 mg/dL — ABNORMAL HIGH (ref 70–99)

## 2011-10-13 NOTE — Progress Notes (Signed)
Patient being transfer to 2005 via wheelchair. Phone report called to [facility].Patient made aware of the transfer.Earlier around 10AM found patient in room attempting to walk to bathroom without calling for assistance and at first did not want to use a walker. Instructed the patient that in the future she needed to call for assistance and wait until someone came to help her. Patient became impatient more anxious and impatient at times and began playing with all the wires and continue to stand up byself even after being told that it was unsafe for her to get up by herself.Patient has become more upset and teary eye and "I just want to go home", explained that the doctor needs to make sure she is able to walk well. After being transferred [facility] charge nurse went to 2000 to calm the patient down and make sure was all right.

## 2011-10-13 NOTE — Evaluation (Signed)
Occupational Therapy Evaluation Patient Details Name: April Branch MRN: [id number] DOB: Jun 30, 1945 Today's Date: 10/13/2011 Time: 2130-8657 OT Time Calculation (min): 30 min  OT Assessment / Plan / Recommendation Clinical Impression  66 yo female admitted for  Fem-pop BPG.  pt needs encouragement for OOB mobility secondary to pain in Lt LE. OT follow acutely.    OT Assessment  Patient needs continued OT Services    Follow Up Recommendations  Home health OT    Barriers to Discharge      Equipment Recommendations  Rolling walker with 5" wheels    Recommendations for Other Services    Frequency  Min 2X/week    Precautions / Restrictions Precautions Precautions: Fall Restrictions Weight Bearing Restrictions: No   Pertinent Vitals/Pain 8 out 10 Lt LE    ADL  Eating/Feeding: Performed;Modified independent Where Assessed - Eating/Feeding: Bed level Grooming: Performed;Wash/dry face;Modified independent Where Assessed - Grooming: Unsupported sitting Upper Body Dressing: Performed;Minimal assistance Where Assessed - Upper Body Dressing: Unsupported sitting Lower Body Dressing: Performed;+1 Total assistance Where Assessed - Lower Body Dressing: Supine, head of bed up Equipment Used: Gait belt;Rolling walker Transfers/Ambulation Related to ADLs: see PT note- Pt ambulated unit and requires (A) with RW and safety. Pt walking RW into wall on Lt side ADL Comments: Pt reluctant to participate initally and progressed nicely. Pt responds well to positive feedback.     OT Diagnosis: Generalized weakness;Acute pain  OT Problem List: Decreased strength;Decreased activity tolerance;Impaired balance (sitting and/or standing);Decreased knowledge of use of DME or AE;Decreased knowledge of precautions;Pain OT Treatment Interventions: Self-care/ADL training;Energy conservation;DME and/or AE instruction;Therapeutic activities;Patient/family education;Balance training   OT Goals Acute Rehab  OT Goals OT Goal Formulation: With patient Time For Goal Achievement: 10/27/11 Potential to Achieve Goals: Good ADL Goals Pt Will Perform Upper Body Bathing: with modified independence;Sit to stand from chair ADL Goal: Upper Body Bathing - Progress: Goal set today Pt Will Perform Upper Body Dressing: with modified independence;Sit to stand from chair ADL Goal: Upper Body Dressing - Progress: Goal set today Pt Will Perform Lower Body Dressing: with set-up;Sit to stand from chair ADL Goal: Lower Body Dressing - Progress: Goal set today Pt Will Transfer to Toilet: with modified independence;3-in-1 ADL Goal: Toilet Transfer - Progress: Goal set today Miscellaneous OT Goals Miscellaneous OT Goal #1: Pt will complete bed mobility mod I as precursor to ADLS OT Goal: Miscellaneous Goal #1 - Progress: Goal set today  Visit Information  Last OT Received On: 10/13/11 Assistance Needed: +1 PT/OT Co-Evaluation/Treatment: Yes    Subjective Data  Subjective: "I miss my puppies" Patient Stated Goal: get back to diong flowers (working in the yard)   Prior Functioning  Vision/Perception  Home Living Lives With: Son Available Help at Discharge: Available 24 hours/day;Friend(s) (son, friend PRN) Type of Home: House Home Access: Stairs to enter Secretary/administrator of Steps: 2 Entrance Stairs-Rails: None Home Layout: One level Bathroom Shower/Tub: Forensic scientist: Standard Bathroom Accessibility: Yes How Accessible: Accessible via walker Home Adaptive Equipment: Crutches Prior Function Level of Independence: Independent Able to Take Stairs?: Yes Driving: Yes (drove within last week) Vocation: On disability Communication Communication: No difficulties Dominant Hand: Right      Cognition  Overall Cognitive Status: Appears within functional limits for tasks assessed/performed Arousal/Alertness: Awake/alert Orientation Level: Oriented X4 / Intact Behavior  During Session: [facility] for tasks performed    Extremity/Trunk Assessment Right Upper Extremity Assessment RUE ROM/Strength/Tone: Within functional levels RUE Sensation: WFL - Light Touch RUE Coordination: WFL -  gross/fine motor Left Upper Extremity Assessment LUE ROM/Strength/Tone: Within functional levels LUE Sensation: WFL - Light Touch LUE Coordination: WFL - gross/fine motor Right Lower Extremity Assessment RLE ROM/Strength/Tone: WFL for tasks assessed RLE Sensation: WFL - Light Touch Left Lower Extremity Assessment LLE ROM/Strength/Tone: Deficits LLE ROM/Strength/Tone Deficits: Limited by pain and edema.   LLE Sensation: WFL - Light Touch Trunk Assessment Trunk Assessment: Normal   Mobility Bed Mobility Bed Mobility: Supine to Sit;Sitting - Scoot to Edge of Bed Supine to Sit: 4: Min assist Sitting - Scoot to Edge of Bed: 4: Min assist Details for Bed Mobility Assistance: A with L LE only.  pt uses rails and able to bring trunk up to sitting.   Transfers Sit to Stand: 1: +2 Total assist;With upper extremity assist;From bed Sit to Stand: Patient Percentage: 80% Stand to Sit: 4: Min assist;With upper extremity assist;To chair/3-in-1 Details for Transfer Assistance: cueing for use of UEs, encouragement, positioning of L LE.     Exercise    Balance Balance Balance Assessed: No  End of Session OT - End of Session Activity Tolerance: Patient tolerated treatment well Patient left: in chair;with call bell/phone within reach Nurse Communication: Mobility status  GO     Harrel Carina [facility] 10/13/2011, 11:46 AM  Pager: [phone]

## 2011-10-13 NOTE — Clinical Documentation Improvement (Signed)
Anemia Blood Loss Clarification  THIS DOCUMENT IS NOT A PERMANENT PART OF THE MEDICAL RECORD  RESPOND TO THE THIS QUERY, FOLLOW THE INSTRUCTIONS BELOW:  1. If needed, update documentation for the patient's encounter via the notes activity.  2. Access this query again and click edit on the In [employer].  3. After updating, or not, click F2 to complete all highlighted (required) fields concerning your review. Select "additional documentation in the medical record" OR "no additional documentation provided".  4. Click Sign note button.  5. The deficiency will fall out of your In Basket *Please let us know if you are not able to complete this workflow by phone or e-mail (listed below).        10/13/11  Dear Dr.Fields Marton Redwood  In an effort to better capture your patient's severity of illness, reflect appropriate length of stay and utilization of resources, a review of the patient medical record has revealed the following indicators.    Based on your clinical judgment, please clarify and document in a progress note and/or discharge summary the clinical condition associated with the following supporting information:  In responding to this query please exercise your independent judgment.  The fact that a query is asked, does not imply that any particular answer is desired or expected.   Possible Clinical Conditions?   " Expected Acute Blood Loss Anemia  " Acute Blood Loss Anemia  " Acute on chronic blood loss anemia  " Other Condition  " Cannot Clinically Determine    Supporting Information: EBL per Anesthesia record:  Diagnostics: H&H on 8/12:    13.8/40.6 H&H on 8/20:    10.0/30.1  Treatments: IV fluids / plasma expanders: Albumin 5%: per Anesthesia record.  Medications: Ferrous sulfate 325mg  po daily with breakfast  Reviewed: additional documentation in the medical record  Thank You,  Marciano Sequin,  Clinical Documentation  Specialist:  Pager: [phone]  Health Information Management [place]

## 2011-10-13 NOTE — Evaluation (Signed)
Physical Therapy Evaluation Patient Details Name: April Branch MRN: [id number] DOB: [birth year] Today's Date: 10/13/2011 Time: 4098-1191 PT Time Calculation (min): 28 min  PT Assessment / Plan / Recommendation Clinical Impression  pt presents with Fem-pop BPG.  pt needs encouragement for OOB mobility secondary to pain in L LE.  Anticipate pt should make good progress to D/C home with family.      PT Assessment  Patient needs continued PT services    Follow Up Recommendations  Home health PT;Supervision - Intermittent    Barriers to Discharge None      Equipment Recommendations  Rolling walker with 5" wheels    Recommendations for Other Services     Frequency Min 3X/week    Precautions / Restrictions Precautions Precautions: Fall Restrictions Weight Bearing Restrictions: No   Pertinent Vitals/Pain 10/10.  RN made aware.        Mobility  Bed Mobility Bed Mobility: Supine to Sit;Sitting - Scoot to Edge of Bed Supine to Sit: 4: Min assist Sitting - Scoot to Edge of Bed: 4: Min assist Details for Bed Mobility Assistance: A with L LE only.  pt uses rails and able to bring trunk up to sitting.   Transfers Transfers: Sit to Stand;Stand to Sit Sit to Stand: 1: +2 Total assist;With upper extremity assist;From bed Sit to Stand: Patient Percentage: 80% Stand to Sit: 4: Min assist;With upper extremity assist;To chair/3-in-1 Details for Transfer Assistance: cueing for use of UEs, encouragement, positioning of L LE.   Ambulation/Gait Ambulation/Gait Assistance: 4: Min assist Ambulation Distance (Feet): 200 Feet Assistive device: Rolling walker Ambulation/Gait Assistance Details: cues for use of RW, sequencing, upright posture, negotiating obstacles.   Gait Pattern: Step-to pattern;Decreased step length - right;Decreased stance time - left Stairs: No Wheelchair Mobility Wheelchair Mobility: No    Exercises     PT Diagnosis: Difficulty walking;Acute pain  PT Problem  List: Decreased strength;Decreased range of motion;Decreased activity tolerance;Decreased balance;Decreased mobility;Decreased knowledge of use of DME;Pain PT Treatment Interventions: DME instruction;Gait training;Stair training;Functional mobility training;Therapeutic activities;Therapeutic exercise;Balance training;Patient/family education   PT Goals Acute Rehab PT Goals PT Goal Formulation: With patient Time For Goal Achievement: 10/20/11 Potential to Achieve Goals: Good Pt will go Supine/Side to Sit: Independently PT Goal: Supine/Side to Sit - Progress: Goal set today Pt will go Sit to Supine/Side: Independently PT Goal: Sit to Supine/Side - Progress: Goal set today Pt will go Sit to Stand: with modified independence PT Goal: Sit to Stand - Progress: Goal set today Pt will go Stand to Sit: with modified independence PT Goal: Stand to Sit - Progress: Goal set today Pt will Ambulate: >150 feet;with modified independence;with rolling walker PT Goal: Ambulate - Progress: Goal set today Pt will Go Up / Down Stairs: 1-2 stairs;with min assist;with least restrictive assistive device PT Goal: Up/Down Stairs - Progress: Goal set today  Visit Information  Last PT Received On: 10/13/11 Assistance Needed: +1 PT/OT Co-Evaluation/Treatment: Yes    Subjective Data  Subjective: I need to get home to my babies.  (3 pitbulls and 1 chihuahua) Patient Stated Goal: Home   Prior Functioning  Home Living Lives With: Son Available Help at Discharge: Available 24 hours/day;Friend(s) (son, friend PRN) Type of Home: House Home Access: Stairs to enter Secretary/administrator of Steps: 2 Entrance Stairs-Rails: None Home Layout: One level Bathroom Shower/Tub: Forensic scientist: Standard Bathroom Accessibility: Yes How Accessible: Accessible via walker Home Adaptive Equipment: Crutches Prior Function Level of Independence: Independent Able to Take Stairs?: Yes Driving:  Yes  (drove within last week) Vocation: On disability Communication Communication: No difficulties Dominant Hand: Right    Cognition  Overall Cognitive Status: Appears within functional limits for tasks assessed/performed Arousal/Alertness: Awake/alert Orientation Level: Oriented X4 / Intact Behavior During Session: [facility] for tasks performed    Extremity/Trunk Assessment Right Lower Extremity Assessment RLE ROM/Strength/Tone: WFL for tasks assessed RLE Sensation: WFL - Light Touch Left Lower Extremity Assessment LLE ROM/Strength/Tone: Deficits LLE ROM/Strength/Tone Deficits: Limited by pain and edema.   LLE Sensation: WFL - Light Touch Trunk Assessment Trunk Assessment: Normal   Balance Balance Balance Assessed: No  End of Session PT - End of Session Equipment Utilized During Treatment: Gait belt Activity Tolerance: Patient limited by pain Patient left: in chair;with call bell/phone within reach Nurse Communication: Mobility status  GP     [name],  [phone] 10/13/2011, 11:29 AM

## 2011-10-13 NOTE — Progress Notes (Signed)
Inpatient Diabetes Program Recommendations  AACE/ADA: New Consensus Statement on Inpatient Glycemic Control (2013)  Target Ranges:  Prepandial:   less than 140 mg/dL      Peak postprandial:   less than 180 mg/dL (1-2 hours)      Critically ill patients:  140 - 180 mg/dL   Inpatient Diabetes Program Recommendations Insulin - Basal: Decrease Lantus to 30 units   Patient only received 30 units last night.  She refused the full dose of Lantus.   Thank you  Piedad Climes RN,BSN,CDE Inpatient Diabetes Coordinator [phone] (team pager)

## 2011-10-13 NOTE — Progress Notes (Addendum)
[facility] [place]  Progress Note Bypass Surgery  Date of Surgery: 10/12/2011  Procedure(s): BYPASS GRAFT FEMORAL-POPLITEAL ARTERY INTRA OPERATIVE ARTERIOGRAM Surgeon: Surgeon(s): Sherren Kerns, MD  1 Day Post-Op  History of Present Illness  April Branch is a 66 y.o. female who is S/P Procedure(s): BYPASS GRAFT FEMORAL-POPLITEAL ARTERY INTRA OPERATIVE ARTERIOGRAM left.  The patient's pre-op symptoms of pain are Improved . Patients pain is well controlled.    VASC. LAB Studies:       pending   Imaging: Dg Ang/ext/uni/or Left  10/12/2011  *RADIOLOGY REPORT*  Clinical data:  Peripheral vascular disease  INTRAOPERATIVE ARTERIOGRAM  Comparison:  None  Findings:  This single intraoperative arteriogram shows a graft to the low popliteal artery. Distal anastomosis appears widely patent. There is contiguous anterior tibial and posterior tibial runoff to the lower edge of the film, above the ankle.  Peroneal runoff is seen to below the calf.  IMPRESSION: 1.  Patent graft to the popliteal artery with runoff as above.   Original Report Authenticated By: Osa Craver, M.D.     Significant Diagnostic Studies: CBC Lab Results  Component Value Date   WBC 8.9 10/13/2011   HGB 10.0* 10/13/2011   HCT 30.1* 10/13/2011   MCV 83.8 10/13/2011   PLT 220 10/13/2011    BMET     Component Value Date/Time   NA 142 10/13/2011 0400   K 3.7 10/13/2011 0400   CL 107 10/13/2011 0400   CO2 26 10/13/2011 0400   GLUCOSE 167* 10/13/2011 0400   BUN 8 10/13/2011 0400   CREATININE 0.70 10/13/2011 0400   CALCIUM 8.7 10/13/2011 0400   GFRNONAA 90* 10/13/2011 0400   GFRAA >90 10/13/2011 0400    COAG Lab Results  Component Value Date   INR 1.07 10/05/2011   INR 1.08 05/09/2011   INR 0.91 09/06/2010   No results found for this basename: PTT    Physical Examination  BP Readings from Last 3 Encounters:  10/13/11 110/52  10/13/11 110/52  10/08/11 145/68   Temp Readings  from Last 3 Encounters:  10/13/11 98.8 F (37.1 C) Oral  10/13/11 98.8 F (37.1 C) Oral  10/05/11 99.2 F (37.3 C)    SpO2 Readings from Last 3 Encounters:  10/13/11 97%  10/13/11 97%  10/08/11 100%   Pulse Readings from Last 3 Encounters:  10/13/11 78  10/13/11 78  10/08/11 79    Pt is A&O x 3 left lower extremity: Incision/s is/are clean,dry.intact, and  healing without hematoma, erythema or drainage Limb is warm; with good color  Left Dorsalis Pedis pulse is palpable LeftPosterior tibial pulse is  palpable    Assessment/Plan: Pt. Doing well Post-op pain is controlled Wounds are clean, dry, intact PT/OT for ambulationCOLLINS, EMMA Branch [phone] 10/13/2011 7:29 AM   Easily palpable DP pulse left foot Foot warm Transfer 2000 Ambulate Blood loss anemia follow for now  Fabienne Bruns, MD [facility] [place] Office: [phone] Pager: [phone] \

## 2011-10-14 ENCOUNTER — Telehealth: Payer: Self-pay | Admitting: Vascular Surgery

## 2011-10-14 ENCOUNTER — Other Ambulatory Visit: Payer: Self-pay | Admitting: *Deleted

## 2011-10-14 DIAGNOSIS — Z48812 Encounter for surgical aftercare following surgery on the circulatory system: Secondary | ICD-10-CM

## 2011-10-14 DIAGNOSIS — I739 Peripheral vascular disease, unspecified: Secondary | ICD-10-CM

## 2011-10-14 MED ORDER — LORAZEPAM 1 MG PO TABS: 1.0000 mg | ORAL_TABLET | Freq: Three times a day (TID) | ORAL | Status: DC

## 2011-10-14 MED ORDER — HYDROCODONE-ACETAMINOPHEN 10-325 MG PO TABS: 1.0000 | ORAL_TABLET | Freq: Four times a day (QID) | ORAL | Status: DC | PRN

## 2011-10-14 NOTE — Telephone Encounter (Signed)
Could not contact patient, sent appt letter, dpm

## 2011-10-14 NOTE — Telephone Encounter (Signed)
Message copied by Fredrich Birks on Wed Oct 14, 2011 10:22 AM ------      Message from: Melene Plan      Created: Wed Oct 14, 2011  9:10 AM                   ----- Message -----         From: Lars Mage, PA         Sent: 10/14/2011   8:04 AM           To: Melene Plan, RN            F/u with Dr. Darrick Penna left fem-pop 3-4 weeks with ABI in office.

## 2011-10-14 NOTE — Progress Notes (Signed)
Occupational Therapy Treatment Patient Details Name: April Branch MRN: [id number] DOB: 07-14-1945 Today's Date: 10/14/2011 Time: 1040-1107 OT Time Calculation (min): 27 min  OT Assessment / Plan / Recommendation Comments on Treatment Session Pt somewhat groggy appearing and demonstrating decreased memory and safety awareness during ADL and mobility.  Pt is agreeable to a RW at home and will have assist of her family as needed.  Pt is performing at a supervision level in self care and mobility.    Follow Up Recommendations  Home health OT    Barriers to Discharge       Equipment Recommendations  Rolling walker with 5" wheels    Recommendations for Other Services    Frequency Min 2X/week   Plan Discharge plan remains appropriate    Precautions / Restrictions Precautions Precautions: Fall Restrictions Weight Bearing Restrictions: No   Pertinent Vitals/Pain 10/10 L LE, repositioned    ADL  Grooming: Performed;Wash/dry hands;Brushing hair;Supervision/safety Where Assessed - Grooming: Unsupported standing Upper Body Dressing: Performed;Set up Where Assessed - Upper Body Dressing: Unsupported sitting Lower Body Dressing: Performed;Supervision/safety Where Assessed - Lower Body Dressing: Supported sit to stand Toilet Transfer: Research scientist (life sciences) Method: Sit to Barista: Regular height toilet Toileting - Clothing Manipulation and Hygiene: Performed;Supervision/safety Where Assessed - Engineer, mining and Hygiene: Standing Equipment Used: Gait belt;Rolling walker Transfers/Ambulation Related to ADLs: Pt attempting to walk without RW, much safer with it.  Pt is agreeable to having a walker at home.  RN notified of need for order as pt is discharging today. ADL Comments: Pt appearing to have some confusion, decreased problem solving and poor awareness of safety.      OT Diagnosis:    OT Problem List:   OT  Treatment Interventions:     OT Goals Acute Rehab OT Goals OT Goal Formulation: With patient ADL Goals Pt Will Perform Upper Body Dressing: with modified independence;Sit to stand from chair ADL Goal: Upper Body Dressing - Progress: Progressing toward goals Pt Will Perform Lower Body Dressing: with set-up;Sit to stand from chair ADL Goal: Lower Body Dressing - Progress: Progressing toward goals Pt Will Transfer to Toilet: with modified independence;3-in-1 ADL Goal: Toilet Transfer - Progress: Progressing toward goals Miscellaneous OT Goals Miscellaneous OT Goal #1: Pt will complete bed mobility mod I as precursor to ADLS OT Goal: Miscellaneous Goal #1 - Progress: Met  Visit Information  Last OT Received On: 10/14/11 Assistance Needed: +1    Subjective Data      Prior Functioning       Cognition  Overall Cognitive Status: Impaired Area of Impairment: Memory;Safety/judgement;Awareness of deficits Arousal/Alertness: Awake/alert Orientation Level: Oriented X4 / Intact Behavior During Session: Lethargic Safety/Judgement: Decreased awareness of safety precautions;Decreased awareness of need for assistance    Mobility Bed Mobility Bed Mobility: Supine to Sit;Sitting - Scoot to Edge of Bed Supine to Sit: 6: Modified independent (Device/Increase time) Sitting - Scoot to Edge of Bed: 6: Modified independent (Device/Increase time) Transfers Transfers: Sit to Stand;Stand to Sit Sit to Stand: 5: Supervision;With upper extremity assist;From bed;From toilet Stand to Sit: 5: Supervision;With upper extremity assist;To chair/3-in-1;To toilet Details for Transfer Assistance: verbal cues for hand placement with sit to and from stand   Exercises    Balance Balance Balance Assessed: Yes Dynamic Sitting Balance Dynamic Sitting - Balance Support: Feet supported Dynamic Sitting - Level of Assistance: 5: Stand by assistance Dynamic Sitting - Balance Activities: Forward lean/weight  shifting Dynamic Sitting - Comments: to donn socks  End  of Session OT - End of Session Activity Tolerance: Patient tolerated treatment well Patient left: in chair;with call bell/phone within reach Nurse Communication: Other (comment) (need for walker for home)  GO     Evern Bio 10/14/2011, 11:21 AM [phone]

## 2011-10-14 NOTE — Progress Notes (Signed)
[facility]   Progress Note Bypass Surgery  Date of Surgery: 10/12/2011  Procedure(s): BYPASS GRAFT FEMORAL-POPLITEAL ARTERY INTRA OPERATIVE ARTERIOGRAM Surgeon: Surgeon(s): Sherren Kerns, MD  2 Days Post-Op  History of Present Illness  April Branch is a 66 y.o. female who is S/P Procedure(s): BYPASS GRAFT FEMORAL-POPLITEAL ARTERY INTRA OPERATIVE ARTERIOGRAM left.  The patient's pre-op symptoms of pain  are Improved . Patients pain is well controlled.            Imaging: Dg Ang/ext/uni/or Left  10/12/2011  *RADIOLOGY REPORT*  Clinical data:  Peripheral vascular disease  INTRAOPERATIVE ARTERIOGRAM  Comparison:  None  Findings:  This single intraoperative arteriogram shows a graft to the low popliteal artery. Distal anastomosis appears widely patent. There is contiguous anterior tibial and posterior tibial runoff to the lower edge of the film, above the ankle.  Peroneal runoff is seen to below the calf.  IMPRESSION: 1.  Patent graft to the popliteal artery with runoff as above.   Original Report Authenticated By: Osa Craver, M.D.     Significant Diagnostic Studies: CBC Lab Results  Component Value Date   WBC 8.9 10/13/2011   HGB 10.0* 10/13/2011   HCT 30.1* 10/13/2011   MCV 83.8 10/13/2011   PLT 220 10/13/2011    BMET     Component Value Date/Time   NA 142 10/13/2011 0400   K 3.7 10/13/2011 0400   CL 107 10/13/2011 0400   CO2 26 10/13/2011 0400   GLUCOSE 167* 10/13/2011 0400   BUN 8 10/13/2011 0400   CREATININE 0.70 10/13/2011 0400   CALCIUM 8.7 10/13/2011 0400   GFRNONAA 90* 10/13/2011 0400   GFRAA >90 10/13/2011 0400    COAG Lab Results  Component Value Date   INR 1.07 10/05/2011   INR 1.08 05/09/2011   INR 0.91 09/06/2010   No results found for this basename: PTT    Physical Examination  BP Readings from Last 3 Encounters:  10/14/11 110/69  10/14/11 110/69  10/08/11 145/68   Temp Readings from Last 3 Encounters:    10/14/11 99.2 F (37.3 C) Oral  10/14/11 99.2 F (37.3 C) Oral  10/05/11 99.2 F (37.3 C)    SpO2 Readings from Last 3 Encounters:  10/14/11 96%  10/14/11 96%  10/08/11 100%   Pulse Readings from Last 3 Encounters:  10/14/11 62  10/14/11 62  10/08/11 79    Pt is A&O x 3 left lower extremity: Incision/s is/are clean,dry.intact, and  healing without hematoma, erythema or drainage Limb is warm; with good color Left Dorsalis Pedis pulse is palpable LeftPosterior tibial pulse is  biphasic by Doppler     Assessment/Plan: Pt. Doing well Post-op pain is controlled Wounds are clean, dry, intact or healing well PT/OT for ambulation D/C home patient needs walker for ambulation  Clinton Gallant [facility] [phone] 10/14/2011 7:40 AM

## 2011-10-14 NOTE — Care Management Note (Signed)
    Page 1 of 1   10/14/2011     9:49:57 AM   CARE MANAGEMENT NOTE 10/14/2011  Patient:  SUNAINA, FERRANDO   Account Number:  [id number]  Date Initiated:  10/14/2011  Documentation initiated by:  [facility]  Subjective/Objective Assessment:   Planned leg surgery for ischemia     Action/Plan:   Anticipated DC Date:  10/16/2011   Anticipated DC Plan:  HOME W HOME HEALTH SERVICES      DC Planning Services  CM consult      Choice offered to / List presented to:             Status of service:  In process, will continue to follow Medicare Important Message given?   (If response is "NO", the following Medicare IM given date fields will be blank) Date Medicare IM given:   Date Additional Medicare IM given:    Discharge Disposition:    Per UR Regulation:  Reviewed for med. necessity/level of care/duration of stay  If discussed at Long Length of Stay Meetings, dates discussed:    Comments:  Contact:  Asoto,Omar Friend [phone]                 Michelene Heady Sister [phone]  10-14-11 8:10am Avie Arenas, RNBSN [phone] Plan for walker on discharge - will need order.  Talked with patient - denies any needs at home - including a walker.  Lives with son who is there with her most of the time.  Per nurse - drags walker behind her as she walks, and doesn't seem to need.

## 2011-10-14 NOTE — Progress Notes (Signed)
Pt given discharge instructions, medication lists, follow up appointments, and when to call the doctor.  Pt verbalizes understanding. Pt given signs and symptoms of infection. Barre Aydelott McClintock    

## 2011-10-14 NOTE — Discharge Summary (Signed)
[facility] Discharge Summary   Patient ID:  April Branch MRN: [id number] DOB/AGE: 1945/11/28 66 y.o.  Admit date: 10/12/2011 Discharge date: 10/14/2011 Date of Surgery: 10/12/2011 Surgeon: Surgeon(s): Sherren Kerns, MD  Admission Diagnosis: Peripheral Vascular Disease  Discharge Diagnoses:  Peripheral Vascular Disease  Secondary Diagnoses: Past Medical History  Diagnosis Date  . Hyperlipidemia   . Arthritis   . Hypothyroidism   . GERD (gastroesophageal reflux disease)   . Anxiety   . Diabetes mellitus age 66    insulin dependent  . Peripheral vascular disease   . Lumbar disc disease   . Insulin dependent type 2 diabetes mellitus, controlled   . GERD 07/20/2006  . CAD (coronary artery disease)   . CAD (coronary artery disease) of bypass graft   . Vertigo   . History of transient ischemic attack (TIA)   . Myocardial infarction     AGE 47    . Anginal pain   . Hypertension   . Depression   . COPD 12/14/2008    PATIENT DENIES    . Stroke     3 MINI STROKES  RT SIDED WEAKNESS  . Personal history of malignant neoplasm of kidney 12/14/2008    Laparoscopic biopsy and cryoablation 7/08 Dr. Normajean Baxter   . Headache   . 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 Neuromuscular disorder     PERIPHERAL NEUROPATHY  . Anemia     Procedure(s): BYPASS GRAFT FEMORAL-POPLITEAL ARTERY INTRA OPERATIVE ARTERIOGRAM  Discharged Condition: good  HPI:  66 year old female patient seen for lower extremity ABIs status post a occluded left femoral popliteal bypass done in April 2012. Patient has continued complaints of claudication that have somewhat worsened since she was seen last. The patient has wavered whether or not to have any further intervention. Patient denies rest pain and has no open wounds. General recent arteriogram which shows that she would be a candidate for a left femoral to below-knee popliteal bypass. She states her claudication is been  getting worse and has decided at this point undergo a redo left femoral to below-knee popliteal bypass. He was recently seen by Dr. Eden Emms who evaluated her for cardiac risk. She is thought to be low risk. She is on Plavix and aspirin     Hospital Course:  April Branch is a 66 y.o. female is S/P Left Procedure(s): BYPASS GRAFT FEMORAL-POPLITEAL ARTERY INTRA OPERATIVE ARTERIOGRAM Extubated: POD # 0 Post-op wounds clean, dry, intact or healing well Pt. Ambulating, voiding and taking PO diet without difficulty. Pt pain controlled with PO pain meds. Labs as below Complications:none  Consults:     Significant Diagnostic Studies: CBC Lab Results  Component Value Date   WBC 8.9 10/13/2011   HGB 10.0* 10/13/2011   HCT 30.1* 10/13/2011   MCV 83.8 10/13/2011   PLT 220 10/13/2011    BMET    Component Value Date/Time   NA 142 10/13/2011 0400   K 3.7 10/13/2011 0400   CL 107 10/13/2011 0400   CO2 26 10/13/2011 0400   GLUCOSE 167* 10/13/2011 0400   BUN 8 10/13/2011 0400   CREATININE 0.70 10/13/2011 0400   CALCIUM 8.7 10/13/2011 0400   GFRNONAA 90* 10/13/2011 0400   GFRAA >90 10/13/2011 0400   COAG Lab Results  Component Value Date   INR 1.07 10/05/2011   INR 1.08 05/09/2011   INR 0.91 09/06/2010     Disposition:  Discharge to :Home Discharge Orders  Future Appointments: Provider: Department: Dept Phone: Center:   11/05/2011 11:00 AM [facility] [phone] [facility]   11/05/2011 11:30 AM Sherren Kerns, MD [facility] [phone] [facility]     Future Orders Please Complete By Expires   For home use only DME Walker rolling      Resume previous diet      Driving Restrictions      Comments:   No driving for 2  weeks   Lifting restrictions      Comments:   No lifting for 6 weeks   Call MD for:  temperature >100.5      Call MD for:  redness, tenderness, or signs of infection (pain, swelling, bleeding, redness, odor or green/yellow discharge around incision site)       Call MD for:  severe or increased pain, loss or decreased feeling  in affected limb(s)      Increase activity slowly      Comments:   Walk with assistance use walker or cane as needed   May shower          Aliviyah, [place]  Home Medication Instructions QIO:[id number]   Printed on:10/14/11 1241  Medication Information                    lansoprazole (PREVACID) 30 MG capsule Take 30 mg by mouth daily.             tolterodine (DETROL LA) 4 MG 24 hr capsule Take 4 mg by mouth daily.             insulin glargine (LANTUS) 100 UNIT/ML injection Inject 60 Units into the skin at bedtime.             insulin aspart (NOVOLOG) 100 UNIT/ML injection Inject 4-6 Units into the skin 2 (two) times daily.            clopidogrel (PLAVIX) 75 MG tablet Take 75 mg by mouth daily.             levothyroxine (SYNTHROID, LEVOTHROID) 88 MCG tablet Take 88 mcg by mouth daily.           amitriptyline (ELAVIL) 75 MG tablet Take 75 mg by mouth at bedtime.           pravastatin (PRAVACHOL) 20 MG tablet Take 20 mg by mouth daily.           metFORMIN (GLUCOPHAGE) 850 MG tablet Take 850 mg by mouth 2 (two) times daily with a meal.           metoprolol tartrate (LOPRESSOR) 25 MG tablet Take 25 mg by mouth 2 (two) times daily.           cyclobenzaprine (FLEXERIL) 10 MG tablet Take 10 mg by mouth every 8 (eight) hours as needed. For muscle spasms           promethazine (PHENERGAN) 25 MG tablet Take 25 mg by mouth every 6 (six) hours as needed. For nausea/vomiting           meclizine (ANTIVERT) 25 MG tablet Take 25 mg by mouth 2 (two) times daily as needed. dizziness           ferrous sulfate (IRON SUPPLEMENT) 325 (65 FE) MG tablet Take 325 mg by mouth daily with breakfast.           meloxicam (MOBIC) 7.5 MG tablet Take 1 tablet (7.5 mg total) by mouth daily.  aspirin 325 MG tablet Take 325 mg by mouth daily.           HYDROcodone-acetaminophen (NORCO) 10-325 MG per tablet Take  1-2 tablets by mouth every 6 (six) hours as needed. For pain           LORazepam (ATIVAN) 1 MG tablet Take 1 tablet (1 mg total) by mouth every 8 (eight) hours.            Verbal and written Discharge instructions given to the patient. Wound care per Discharge AVS Follow-up Information    Follow up with Sherren Kerns, MD in 3 weeks. (sent)    Contact information:   [address] [place] [address] [phone]          Signed: Clinton Gallant [facility] 10/14/2011, 12:41 PM

## 2011-10-16 ENCOUNTER — Encounter: Payer: Self-pay | Admitting: Neurosurgery

## 2011-10-16 ENCOUNTER — Ambulatory Visit (INDEPENDENT_AMBULATORY_CARE_PROVIDER_SITE_OTHER): Payer: Medicare Other | Admitting: Neurosurgery

## 2011-10-16 ENCOUNTER — Telehealth: Payer: Self-pay | Admitting: *Deleted

## 2011-10-16 VITALS — BP 122/78 | HR 79 | Resp 16 | Ht 61.0 in | Wt 143.0 lb

## 2011-10-16 DIAGNOSIS — I70219 Atherosclerosis of native arteries of extremities with intermittent claudication, unspecified extremity: Secondary | ICD-10-CM

## 2011-10-16 MED ORDER — OXYCODONE-ACETAMINOPHEN 5-325 MG PO TABS: 1.0000 | ORAL_TABLET | ORAL | Status: AC | PRN

## 2011-10-16 NOTE — Progress Notes (Signed)
Subjective:     Patient ID: April Branch, female   DOB: 1946-01-05, 66 y.o.   MRN: [id number]  HPI: 66 year old patient of Dr. Darrick Penna status post Left femoral to below knee popliteal bypass with Propaten, angiogram performed Monday, 10/12/2011. The patient called stating her leg was swollen 2-3 times normal size and draining from the incisions we brought her in for a wound check.    Review of Systems: 12 point review of systems is notable for the difficulties described above otherwise unremarkable     Objective:   Physical Exam: Afebrile, vital signs are stable, left lower extremity has 1-2+ pitting edema but nothing extreme. The most distal incision is without erythema I personally do not find any drainage and the incision is intact. Dr. Imogene Burn also examined the patient and we explained to her that the amount of swelling she has now is due to increased blood flow and being 4 days postop, there are no complications visualized.     Assessment:     Assessment as above    Plan:     Plan will be for the patient to return as scheduled for postop with Dr. Darrick Penna. In the end, the patient did asked for more pain medicine and a different stronger medicine, I did issue a prescription for Percocet 5/325 one by mouth every 4 hours when necessary 30 with no refill she will followup with Dr. Darrick Penna as scheduled.  Lauree Chandler ANP  Clinic M.D.: Imogene Burn

## 2011-10-16 NOTE — Telephone Encounter (Signed)
Patient called in to report her leg is swollen twice it's normal size since yesterday. Her pain has also increased significantly since last night. No fever but she does have " thick pus" draining from incisions. She will come in today and see our NP for evaluation. She had a Left Fem-Pop on 10-12-11 by Dr. Darrick Penna.

## 2011-10-19 ENCOUNTER — Telehealth: Payer: Self-pay | Admitting: *Deleted

## 2011-10-19 NOTE — Telephone Encounter (Signed)
Patient called in to the triage phone at 10:14am saying her knee was still painful and swollen. She saw Rusty and Dr. Imogene Burn for this on 10-16-11; was Rx's percocet 5/325mg  #30. I called her back at 10:20am and left a msg for her to call back with more information.

## 2011-10-20 ENCOUNTER — Telehealth: Payer: Self-pay

## 2011-10-20 NOTE — Telephone Encounter (Signed)
Phone call from pt.  States she would like to schedule appt. to see Dr. Darrick Penna this week; c/o "pain in left leg/ calf area, and left foot that keeps me awake at night".  States it is painful to stand on.  Describes an area at end of incision, in the calf region that "is open a little and draining a bloody drainage".  States has had "chills".  States "incisional area left lower leg throbs like a toothache."   Rates pain at level 10/10.  States pain has worsened since Friday, 10/23 when in office.  Discussed with Dr. Arbie Cookey.  Questioned if pt. can be scheduled on 8/29, when Dr. Darrick Penna in office; Per  Dr. Arbie Cookey- okay to wait until 8/29/ to bring pt. in for evaluation.  Will contact pt with appt. date/time.

## 2011-10-21 ENCOUNTER — Encounter: Payer: Self-pay | Admitting: Neurosurgery

## 2011-10-22 ENCOUNTER — Encounter: Payer: Self-pay | Admitting: Neurosurgery

## 2011-10-22 ENCOUNTER — Ambulatory Visit (INDEPENDENT_AMBULATORY_CARE_PROVIDER_SITE_OTHER): Payer: Medicare Other | Admitting: Vascular Surgery

## 2011-10-22 VITALS — BP 130/74 | HR 82 | Resp 16 | Ht 64.0 in | Wt 140.0 lb

## 2011-10-22 DIAGNOSIS — M79609 Pain in unspecified limb: Secondary | ICD-10-CM

## 2011-10-22 DIAGNOSIS — I70219 Atherosclerosis of native arteries of extremities with intermittent claudication, unspecified extremity: Secondary | ICD-10-CM

## 2011-10-22 NOTE — Progress Notes (Signed)
Patient returns today for followup. She underwent a left femoral to below-knee popliteal bypass with propaten on August 19. She continues to complain of pain in the below-knee incision. This is worse when walking. She is also requesting more pain medication today. Denies rest pain. She denies any drainage from the incisions.  Physical exam: Blood pressure 130/74, pulse 82, resp. rate 16, height 5\' 4"  (1.626 m), weight 140 lb (63.504 kg), SpO2 100.00%.  Left lower extremity: All incisions are completely healed at this point. The patient has a 2+ dorsalis pedis pulse in the left foot.  Assessment: Postoperative pain. Patent bypass with no evidence of infection  Plan: The patient was given a prescription today for Vicodin. #60 dispensed. I do not believe she will need further refills. She has followup scheduled with me in September. I did not give her a refill of her Ativan which she was also requesting today. She will discuss this with her primary care physician.   Fabienne Bruns, MD [facility] [place] Office: [phone] Pager: [phone]

## 2011-10-27 ENCOUNTER — Emergency Department (HOSPITAL_COMMUNITY): Payer: Medicare Other

## 2011-10-27 ENCOUNTER — Encounter (HOSPITAL_COMMUNITY): Payer: Self-pay | Admitting: Emergency Medicine

## 2011-10-27 ENCOUNTER — Emergency Department (INDEPENDENT_AMBULATORY_CARE_PROVIDER_SITE_OTHER): Payer: Medicare Other

## 2011-10-27 ENCOUNTER — Emergency Department (INDEPENDENT_AMBULATORY_CARE_PROVIDER_SITE_OTHER)
Admission: EM | Admit: 2011-10-27 | Discharge: 2011-10-27 | Disposition: A | Discharge status: Home / Self Care | Payer: Medicare Other | Source: Home / Self Care

## 2011-10-27 DIAGNOSIS — S93409A Sprain of unspecified ligament of unspecified ankle, initial encounter: Secondary | ICD-10-CM

## 2011-10-27 DIAGNOSIS — S8010XA Contusion of unspecified lower leg, initial encounter: Secondary | ICD-10-CM

## 2011-10-27 MED ORDER — HYDROCODONE-ACETAMINOPHEN 10-325 MG PO TABS: 1.0000 | ORAL_TABLET | Freq: Four times a day (QID) | ORAL | Status: DC | PRN

## 2011-10-27 NOTE — ED Notes (Signed)
Larey Seat off porch on to the ground.  Patient was trying to keep her dogs separated, house door fell into patient and knocked her off balance and off the porch.  .  Patient has had multiple surgeries on left leg.  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  Left leg and ankle had minimal swelling since surgery, but swelling increased after falling today.  Reports soreness in back of leg prior to fall.  Since the fall increased soreness in left hip and complete leg.  Estimates falling approx 4 feet .

## 2011-10-27 NOTE — ED Provider Notes (Signed)
History     CSN: [id number]  Arrival date & time 10/27/11  1344   None     Chief Complaint  Patient presents with  . Fall    (Consider location/radiation/quality/duration/timing/severity/associated sxs/prior treatment) HPI Comments: Se RN note. Fell as above. Her complaint is within the L leg. Her mild edema in the leg below the knee has increased. Tenderness mostly in the ankle but also calf, anterior tibia and the healing op-site in proximal medial lower leg. No redness, Distal pulses and sensitivity intact. Denies other injury.   Patient is a 66 y.o. female presenting with fall.  Fall Pertinent negatives include no fever.    Past Medical History  Diagnosis Date  . Hyperlipidemia   . Arthritis   . Hypothyroidism   . GERD (gastroesophageal reflux disease)   . Anxiety   . Diabetes mellitus age 70    insulin dependent  . Peripheral vascular disease   . Lumbar disc disease   . Insulin dependent type 2 diabetes mellitus, controlled   . GERD 07/20/2006  . CAD (coronary artery disease)   . CAD (coronary artery disease) of bypass graft   . Vertigo   . History of transient ischemic attack (TIA)   . Myocardial infarction     AGE 55    . Anginal pain   . Hypertension   . Depression   . COPD 12/14/2008    PATIENT DENIES    . Stroke     3 MINI STROKES  RT SIDED WEAKNESS  . Personal history of malignant neoplasm of kidney 12/14/2008    Laparoscopic biopsy and cryoablation 7/08 Dr. Normajean Baxter   . Headache   . 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 Neuromuscular disorder     PERIPHERAL NEUROPATHY  . Anemia     Past Surgical History  Procedure Date  . Appendectomy   . Abdominal hysterectomy   . Knee surgery   . Cholecystectomy   . Lung surgery     lung nodule removed from the right side  . Laparascopic cryoablation of left kidney 08/2006    Dr. Sherre Poot for renal cell cancer  . Bladder suspension   . Shoulder surgery   . Foot surgery   . Pr vein bypass  graft,aorto-fem-pop 05/27/2010  . Cardiac catheterization   . Femoral-popliteal bypass graft 10/12/2011    Procedure: BYPASS GRAFT FEMORAL-POPLITEAL ARTERY;  Surgeon: Sherren Kerns, MD;  Location: [facility] OR;  Service: Vascular;  Laterality: Left;  Left Femoral-Popliteal Bypass Graft using 6mm x 80cm Propaten Graft with intraop arteriogram times one.  . Intraoperative arteriogram 10/12/2011    Procedure: INTRA OPERATIVE ARTERIOGRAM;  Surgeon: Sherren Kerns, MD;  Location: [facility] OR;  Service: Vascular;  Laterality: Left;    Family History  Problem Relation Age of Onset  . Heart disease Mother   . Heart disease Father   . Heart disease Sister   . Lung cancer Mother   . Lung cancer Father     History  Substance Use Topics  . Smoking status: Former Smoker -- 50 years    Types: Cigarettes    Quit date: 07/12/2011  . Smokeless tobacco: Not on file  . Alcohol Use: No    OB History    Grav Para Term Preterm Abortions TAB SAB Ect Mult Living                  Review of Systems  Constitutional: Negative for fever and activity  change.  Respiratory: Negative.   Cardiovascular: Negative.   Musculoskeletal: Positive for joint swelling.       Tenderness in L ankle. No deformity.   Neurological: Negative.   Psychiatric/Behavioral: Negative.     Allergies  Niacin-simvastatin er  Home Medications   Current Outpatient Rx  Name Route Sig Dispense Refill  . AMITRIPTYLINE HCL 75 MG PO TABS Oral Take 75 mg by mouth at bedtime.    . ASPIRIN 325 MG PO TABS Oral Take 325 mg by mouth daily.    Marland Kitchen CLOPIDOGREL BISULFATE 75 MG PO TABS Oral Take 75 mg by mouth daily.      . CYCLOBENZAPRINE HCL 10 MG PO TABS Oral Take 10 mg by mouth every 8 (eight) hours as needed. For muscle spasms    . FERROUS SULFATE 325 (65 FE) MG PO TABS Oral Take 325 mg by mouth daily with breakfast.    . HYDROCODONE-ACETAMINOPHEN 10-325 MG PO TABS Oral Take 1-2 tablets by mouth every 6 (six) hours as needed. For pain 30 tablet 0   . HYDROCODONE-ACETAMINOPHEN 10-325 MG PO TABS Oral Take 1 tablet by mouth every 6 (six) hours as needed for pain. 15 tablet 0  . INSULIN ASPART 100 UNIT/ML [place] SOLN Subcutaneous Inject 4-6 Units into the skin 2 (two) times daily.     . INSULIN GLARGINE 100 UNIT/ML [place] SOLN Subcutaneous Inject 60 Units into the skin at bedtime.      Marland Kitchen LANSOPRAZOLE 30 MG PO CPDR Oral Take 30 mg by mouth daily.      Marland Kitchen LEVOTHYROXINE SODIUM 88 MCG PO TABS Oral Take 88 mcg by mouth daily.    Marland Kitchen LORAZEPAM 1 MG PO TABS Oral Take 1 tablet (1 mg total) by mouth every 8 (eight) hours. 30 tablet 0  . MECLIZINE HCL 25 MG PO TABS Oral Take 25 mg by mouth 2 (two) times daily as needed. dizziness    . MELOXICAM 7.5 MG PO TABS Oral Take 1 tablet (7.5 mg total) by mouth daily. 14 tablet 0  . METFORMIN HCL 850 MG PO TABS Oral Take 850 mg by mouth 2 (two) times daily with a meal.    . METOPROLOL TARTRATE 25 MG PO TABS Oral Take 25 mg by mouth 2 (two) times daily.    . OXYCODONE-ACETAMINOPHEN 5-325 MG PO TABS Oral Take 1 tablet by mouth every 4 (four) hours as needed for pain. 30 tablet 0  . PRAVASTATIN SODIUM 20 MG PO TABS Oral Take 20 mg by mouth daily.    Marland Kitchen PROMETHAZINE HCL 25 MG PO TABS Oral Take 25 mg by mouth every 6 (six) hours as needed. For nausea/vomiting    . TOLTERODINE TARTRATE ER 4 MG PO CP24 Oral Take 4 mg by mouth daily.        BP 145/80  Pulse 96  Temp 98.8 F (37.1 C) (Oral)  Resp 16  SpO2 96%  Physical Exam  Constitutional: She appears well-developed and well-nourished.  HENT:  Head: Normocephalic and atraumatic.  Neck: Normal range of motion. Neck supple.  Pulmonary/Chest: Effort normal.  Musculoskeletal:       See HPI  Neurological: She is alert.  Skin: Skin is warm and dry.    ED Course  Procedures (including critical care time)  Labs Reviewed - No data to display Dg Tibia/fibula Left  10/27/2011  *RADIOLOGY REPORT*  Clinical Data: Leg trauma.  Recent leg surgery.  Medial knee surgical  incision.  LEFT TIBIA AND FIBULA - 2 VIEW  Comparison: None.  Findings: Soft tissue swelling is present in the medial proximal aspect of the leg with diffuse edema in the distal subcutaneous tissues of the leg.  Surgical clips are present along the medial aspect of the knee.  Tibia and fibula are intact.  IMPRESSION: No acute osseous abnormality.  Postoperative changes and soft tissue swelling around the knee and medial proximal leg.   Original Report Authenticated By: Andreas Newport, M.D.    Dg Ankle Complete Left  10/27/2011  *RADIOLOGY REPORT*  Clinical Data: History of fall complaining of ankle pain.  LEFT ANKLE COMPLETE - 3+ VIEW  Comparison: No priors.  Findings: Small amount of soft tissue swelling medial and lateral to the ankle joint.  No acute displaced fracture, subluxation or dislocation.  Small dorsal and plantar calcaneal spurs are noted. Heterotopic ossification is again noted in the plantar soft tissues underlying the calcaneus (similar to prior foot radiograph 07/19/2009).  IMPRESSION: 1.  Mild soft tissue swelling around the ankle joint, without definite acute bony abnormality.   Original Report Authenticated By: Florencia Reasons, M.D.      1. Sprain of ankle   2. Contusion, lower leg       MDM  Dg Tibia/fibula Left  10/27/2011  *RADIOLOGY REPORT*  Clinical Data: Leg trauma.  Recent leg surgery.  Medial knee surgical incision.  LEFT TIBIA AND FIBULA - 2 VIEW  Comparison: None.  Findings: Soft tissue swelling is present in the medial proximal aspect of the leg with diffuse edema in the distal subcutaneous tissues of the leg.  Surgical clips are present along the medial aspect of the knee.  Tibia and fibula are intact.  IMPRESSION: No acute osseous abnormality.  Postoperative changes and soft tissue swelling around the knee and medial proximal leg.   Original Report Authenticated By: Andreas Newport, M.D.    Dg Ankle Complete Left  10/27/2011  *RADIOLOGY REPORT*  Clinical Data:  History of fall complaining of ankle pain.  LEFT ANKLE COMPLETE - 3+ VIEW  Comparison: No priors.  Findings: Small amount of soft tissue swelling medial and lateral to the ankle joint.  No acute displaced fracture, subluxation or dislocation.  Small dorsal and plantar calcaneal spurs are noted. Heterotopic ossification is again noted in the plantar soft tissues underlying the calcaneus (similar to prior foot radiograph 07/19/2009).  IMPRESSION: 1.  Mild soft tissue swelling around the ankle joint, without definite acute bony abnormality.   Original Report Authenticated By: Florencia Reasons, M.D.     Dg Tibia/fibula Left  10/27/2011  *RADIOLOGY REPORT*  Clinical Data: Leg trauma.  Recent leg surgery.  Medial knee surgical incision.  LEFT TIBIA AND FIBULA - 2 VIEW  Comparison: None.  Findings: Soft tissue swelling is present in the medial proximal aspect of the leg with diffuse edema in the distal subcutaneous tissues of the leg.  Surgical clips are present along the medial aspect of the knee.  Tibia and fibula are intact.  IMPRESSION: No acute osseous abnormality.  Postoperative changes and soft tissue swelling around the knee and medial proximal leg.   Original Report Authenticated By: Andreas Newport, M.D.    Dg Ankle Complete Left  10/27/2011  *RADIOLOGY REPORT*  Clinical Data: History of fall complaining of ankle pain.  LEFT ANKLE COMPLETE - 3+ VIEW  Comparison: No priors.  Findings: Small amount of soft tissue swelling medial and lateral to the ankle joint.  No acute displaced fracture, subluxation or dislocation.  Small dorsal and plantar calcaneal spurs  are noted. Heterotopic ossification is again noted in the plantar soft tissues underlying the calcaneus (similar to prior foot radiograph 07/19/2009).  IMPRESSION: 1.  Mild soft tissue swelling around the ankle joint, without definite acute bony abnormality.   Original Report Authenticated By: Florencia Reasons, M.D.       Hayden Rasmussen, NP 10/27/11  1549

## 2011-10-28 LAB — POCT I-STAT GLUCOSE
Glucose, Bld: 90 mg/dL (ref 70–99)
Glucose, Bld: 82 mg/dL (ref 70–99)

## 2011-10-28 NOTE — ED Provider Notes (Signed)
Medical screening examination/treatment/procedure(s) were performed by resident physician or non-physician practitioner and as supervising physician I was immediately available for consultation/collaboration.   KINDL,JAMES DOUGLAS MD.    James D Kindl, MD 10/28/11 2132 

## 2011-11-02 ENCOUNTER — Ambulatory Visit (INDEPENDENT_AMBULATORY_CARE_PROVIDER_SITE_OTHER): Payer: Medicare Other | Admitting: Family Medicine

## 2011-11-02 VITALS — BP 144/80 | HR 97 | Temp 98.1°F | Resp 16 | Ht 61.75 in | Wt 140.6 lb

## 2011-11-02 DIAGNOSIS — F411 Generalized anxiety disorder: Secondary | ICD-10-CM

## 2011-11-02 DIAGNOSIS — G8929 Other chronic pain: Secondary | ICD-10-CM

## 2011-11-02 DIAGNOSIS — F419 Anxiety disorder, unspecified: Secondary | ICD-10-CM

## 2011-11-02 DIAGNOSIS — M1990 Unspecified osteoarthritis, unspecified site: Secondary | ICD-10-CM

## 2011-11-02 MED ORDER — DIAZEPAM 2 MG PO TABS: ORAL_TABLET | ORAL | Status: DC

## 2011-11-02 MED ORDER — TOLTERODINE TARTRATE ER 4 MG PO CP24: 4.0000 mg | ORAL_CAPSULE | Freq: Every day | ORAL | Status: DC

## 2011-11-02 MED ORDER — MELOXICAM 7.5 MG PO TABS: 7.5000 mg | ORAL_TABLET | Freq: Every day | ORAL | Status: DC

## 2011-11-02 NOTE — Progress Notes (Signed)
x [facility]:  Office Visit  Chief Complaint:  Chief Complaint  Patient presents with  . Annual Exam    needs medication refills on hydrocodone 10/325, ativan 1 mg, and mobic 7.5 mg, and detrol la 4mg     HPI: April Branch is a 66 y.o. female who complains of : 1. Out of medications. Does not have medical doctor. Would like to establish care with PCP 2. Was seeing Dr. Mayford Knife. She owes him $500  And they will not take payments so she needs a new PCP.  She is on alot of medicines but she really just "needs refills on her hydrocodone, ativan and detrol". She was unable to tell me if she has any of her other medications left. . She does not have anything. Patient is unreliable in answering questions: When asked  When was the Last time she got medicines. " Does not remember". She does not have her Hydrocodone, she does not have her ativan, she does not have her Detrol La, she does not have her meclizine. " SHe does not have any of those."    Past Medical History  Diagnosis Date  . Hyperlipidemia   . Arthritis   . Hypothyroidism   . GERD (gastroesophageal reflux disease)   . Anxiety   . Diabetes mellitus age 72    insulin dependent  . Peripheral vascular disease   . Lumbar disc disease   . Insulin dependent type 2 diabetes mellitus, controlled   . GERD 07/20/2006  . CAD (coronary artery disease)   . CAD (coronary artery disease) of bypass graft   . Vertigo   . History of transient ischemic attack (TIA)   . Myocardial infarction     AGE 31    . Anginal pain   . Hypertension   . Depression   . COPD 12/14/2008    PATIENT DENIES    . Stroke     3 MINI STROKES  RT SIDED WEAKNESS  . Personal history of malignant neoplasm of kidney 12/14/2008    Laparoscopic biopsy and cryoablation 7/08 Dr. Normajean Baxter   . Headache   . 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 Neuromuscular disorder     PERIPHERAL NEUROPATHY  . Anemia    Past Surgical History    Procedure Date  . Appendectomy   . Abdominal hysterectomy   . Knee surgery   . Cholecystectomy   . Lung surgery     lung nodule removed from the right side  . Laparascopic cryoablation of left kidney 08/2006    Dr. Sherre Poot for renal cell cancer  . Bladder suspension   . Shoulder surgery   . Foot surgery   . Pr vein bypass graft,aorto-fem-pop 05/27/2010  . Cardiac catheterization   . Femoral-popliteal bypass graft 10/12/2011    Procedure: BYPASS GRAFT FEMORAL-POPLITEAL ARTERY;  Surgeon: Sherren Kerns, MD;  Location: [facility] OR;  Service: Vascular;  Laterality: Left;  Left Femoral-Popliteal Bypass Graft using 6mm x 80cm Propaten Graft with intraop arteriogram times one.  . Intraoperative arteriogram 10/12/2011    Procedure: INTRA OPERATIVE ARTERIOGRAM;  Surgeon: Sherren Kerns, MD;  Location: [facility] OR;  Service: Vascular;  Laterality: Left;   History   Social History  . Marital Status: Widowed    Spouse Name: N/A    Number of Children: N/A  . Years of Education: N/A   Social History Main Topics  . Smoking status: Former Smoker -- 50 years  Types: Cigarettes    Quit date: 07/12/2011  . Smokeless tobacco: None  . Alcohol Use: No  . Drug Use: No  . Sexually Active:    Other Topics Concern  . None   Social History Narrative   Widow.  Two sons. Husband died of lung cancer   Family History  Problem Relation Age of Onset  . Heart disease Mother   . Heart disease Father   . Heart disease Sister   . Lung cancer Mother   . Lung cancer Father    Allergies  Allergen Reactions  . Niacin-Simvastatin Er Hives   Prior to Admission medications   Medication Sig Start Date End Date Taking? Authorizing Provider  amitriptyline (ELAVIL) 75 MG tablet Take 75 mg by mouth at bedtime.   Yes Historical Provider, MD  aspirin 325 MG tablet Take 325 mg by mouth daily.   Yes Historical Provider, MD  clopidogrel (PLAVIX) 75 MG tablet Take 75 mg by mouth daily.     Yes Historical Provider, MD   cyclobenzaprine (FLEXERIL) 10 MG tablet Take 10 mg by mouth every 8 (eight) hours as needed. For muscle spasms   Yes Historical Provider, MD  ferrous sulfate (IRON SUPPLEMENT) 325 (65 FE) MG tablet Take 325 mg by mouth daily with breakfast.   Yes Historical Provider, MD  HYDROcodone-acetaminophen (NORCO) 10-325 MG per tablet Take 1-2 tablets by mouth every 6 (six) hours as needed. For pain 10/14/11  Yes Lars Mage, PA  HYDROcodone-acetaminophen (NORCO) 10-325 MG per tablet Take 1 tablet by mouth every 6 (six) hours as needed for pain. 10/27/11 11/06/11 Yes Hayden Rasmussen, NP  insulin aspart (NOVOLOG) 100 UNIT/ML injection Inject 4-6 Units into the skin 2 (two) times daily.    Yes Historical Provider, MD  insulin glargine (LANTUS) 100 UNIT/ML injection Inject 60 Units into the skin at bedtime.     Yes Historical Provider, MD  lansoprazole (PREVACID) 30 MG capsule Take 30 mg by mouth daily.     Yes Historical Provider, MD  levothyroxine (SYNTHROID, LEVOTHROID) 88 MCG tablet Take 88 mcg by mouth daily.   Yes Historical Provider, MD  LORazepam (ATIVAN) 1 MG tablet Take 1 tablet (1 mg total) by mouth every 8 (eight) hours. 10/14/11  Yes Lars Mage, PA  meclizine (ANTIVERT) 25 MG tablet Take 25 mg by mouth 2 (two) times daily as needed. dizziness   Yes Historical Provider, MD  meloxicam (MOBIC) 7.5 MG tablet Take 1 tablet (7.5 mg total) by mouth daily. 08/06/11 08/19/12 Yes Jimmie Molly, MD  metFORMIN (GLUCOPHAGE) 850 MG tablet Take 850 mg by mouth 2 (two) times daily with a meal.   Yes Historical Provider, MD  metoprolol tartrate (LOPRESSOR) 25 MG tablet Take 25 mg by mouth 2 (two) times daily.   Yes Historical Provider, MD  pravastatin (PRAVACHOL) 20 MG tablet Take 20 mg by mouth daily.   Yes Historical Provider, MD  promethazine (PHENERGAN) 25 MG tablet Take 25 mg by mouth every 6 (six) hours as needed. For nausea/vomiting   Yes Historical Provider, MD  tolterodine (DETROL LA) 4 MG 24 hr capsule Take 4 mg  by mouth daily.     Yes Historical Provider, MD     ROS: The patient denies fevers, chills, night sweats, unintentional weight loss, chest pain, palpitations, wheezing, dyspnea on exertion, nausea, vomiting, abdominal pain, dysuria, hematuria, melena, numbness, weakness, or tingling.  All other systems have been reviewed and were otherwise negative with the exception of those mentioned in  the HPI and as above.    PHYSICAL EXAM: Filed Vitals:   11/02/11 1331  BP: 144/80  Pulse: 97  Temp: 98.1 F (36.7 C)  Resp: 16   Filed Vitals:   11/02/11 1331  Height: 5' 1.75" (1.568 m)  Weight: 140 lb 9.6 oz (63.776 kg)   Body mass index is 25.92 kg/(m^2).  General: Alert, no acute distress HEENT:  Normocephalic, atraumatic, oropharynx patent.  Cardiovascular:  Regular rate and rhythm, no rubs murmurs or gallops.  No Carotid bruits, radial pulse intact. No pedal edema.  Respiratory: Clear to auscultation bilaterally.  No wheezes, rales, or rhonchi.  No cyanosis, no use of accessory musculature GI: No organomegaly, abdomen is soft and non-tender, positive bowel sounds.  No masses. Skin: No rashes. Neurologic: Facial musculature symmetric. Psychiatric: Patient is appropriate throughout our interaction. Lymphatic: No cervical lymphadenopathy Musculoskeletal: Gait intact.   LABS: Results for orders placed during the hospital encounter of 10/12/11  GLUCOSE, CAPILLARY      Component Value Range   Glucose-Capillary 63 (*) 70 - 99 mg/dL  GLUCOSE, CAPILLARY      Component Value Range   Glucose-Capillary 58 (*) 70 - 99 mg/dL  GLUCOSE, CAPILLARY      Component Value Range   Glucose-Capillary 124 (*) 70 - 99 mg/dL  GLUCOSE, CAPILLARY      Component Value Range   Glucose-Capillary 126 (*) 70 - 99 mg/dL  CBC      Component Value Range   WBC 8.9  4.0 - 10.5 K/uL   RBC 3.59 (*) 3.87 - 5.11 MIL/uL   Hemoglobin 10.0 (*) 12.0 - 15.0 g/dL   HCT 04.5 (*) 40.9 - 81.1 %   MCV 83.8  78.0 - 100.0 fL    MCH 27.9  26.0 - 34.0 pg   MCHC 33.2  30.0 - 36.0 g/dL   RDW 91.4  78.2 - 95.6 %   Platelets 220  150 - 400 K/uL  BASIC METABOLIC PANEL      Component Value Range   Sodium 142  135 - 145 mEq/L   Potassium 3.7  3.5 - 5.1 mEq/L   Chloride 107  96 - 112 mEq/L   CO2 26  19 - 32 mEq/L   Glucose, Bld 167 (*) 70 - 99 mg/dL   BUN 8  6 - 23 mg/dL   Creatinine, Ser 2.13  0.50 - 1.10 mg/dL   Calcium 8.7  8.4 - 08.6 mg/dL   GFR calc non Af Amer 90 (*) >90 mL/min   GFR calc Af Amer >90  >90 mL/min  GLUCOSE, CAPILLARY      Component Value Range   Glucose-Capillary 127 (*) 70 - 99 mg/dL   Comment 1 Notify RN     Comment 2 Documented in Chart    GLUCOSE, CAPILLARY      Component Value Range   Glucose-Capillary 101 (*) 70 - 99 mg/dL   Comment 1 Notify RN    GLUCOSE, CAPILLARY      Component Value Range   Glucose-Capillary 106 (*) 70 - 99 mg/dL   Comment 1 Notify RN     Comment 2 Documented in Chart    GLUCOSE, CAPILLARY      Component Value Range   Glucose-Capillary 129 (*) 70 - 99 mg/dL  GLUCOSE, CAPILLARY      Component Value Range   Glucose-Capillary 83  70 - 99 mg/dL  GLUCOSE, CAPILLARY      Component Value Range   Glucose-Capillary 130 (*)  70 - 99 mg/dL  POCT I-STAT GLUCOSE      Component Value Range   Glucose, Bld 90  70 - 99 mg/dL  POCT I-STAT GLUCOSE      Component Value Range   Glucose, Bld 82  70 - 99 mg/dL     EKG/XRAY:   Primary read interpreted by Dr. Conley Rolls at [facility].   ASSESSMENT/PLAN: Encounter Diagnoses  Name Primary?  Marland Kitchen Anxiety Yes  . Arthritis    Patient is here specifically medication refills of her narcotics and benzos.  I told patient that we do not at [facility] write rx for chronic pain of ? Etiology on chronic narcotics. I will change her Ativan to valium 2 mg daily #30 pill only for anxiety so she does not go into benzo withdrawal. She denies substance or dependence even after telling her the results of her prescription drug profile. According to her drug  profile she has been getting large amounts of narcotics most recently from 8/26-28 from different pharmacies in town. Patient understands that she needs to establish care with PCP for chronic medical problems, she was advise to see chronic pain management. We will try to refer her but will not be giving her any rx  for her narcotics.   Refilled Detrol for bladder If she runs out of her other medications, then she can return and bring her medications to get re-evaluated for those medical problems; however, I emphasized with her that I will not refill her benzos or her narcotic medications. I will refer her out to pain management.    Hamilton Capri PHUONG, DO 11/02/2011 3:37 PM

## 2011-11-03 ENCOUNTER — Encounter (HOSPITAL_COMMUNITY): Payer: Self-pay

## 2011-11-03 ENCOUNTER — Emergency Department (HOSPITAL_COMMUNITY)
Admission: EM | Admit: 2011-11-03 | Discharge: 2011-11-03 | Disposition: A | Discharge status: Home / Self Care | Payer: Medicare Other | Attending: Emergency Medicine | Admitting: Emergency Medicine

## 2011-11-03 DIAGNOSIS — I739 Peripheral vascular disease, unspecified: Secondary | ICD-10-CM

## 2011-11-03 DIAGNOSIS — T8131XA Disruption of external operation (surgical) wound, not elsewhere classified, initial encounter: Secondary | ICD-10-CM | POA: Insufficient documentation

## 2011-11-03 DIAGNOSIS — Y838 Other surgical procedures as the cause of abnormal reaction of the patient, or of later complication, without mention of misadventure at the time of the procedure: Secondary | ICD-10-CM | POA: Insufficient documentation

## 2011-11-03 LAB — COMPREHENSIVE METABOLIC PANEL
ALT: 14 U/L (ref 0–35)
Alkaline Phosphatase: 88 U/L (ref 39–117)
BUN: 10 mg/dL (ref 6–23)
Chloride: 106 mEq/L (ref 96–112)
GFR calc Af Amer: 77 mL/min — ABNORMAL LOW (ref >90)
Glucose, Bld: 108 mg/dL — ABNORMAL HIGH (ref 70–99)
Potassium: 3.3 mEq/L — ABNORMAL LOW (ref 3.5–5.1)
Sodium: 141 mEq/L (ref 135–145)
Total Bilirubin: 0.2 mg/dL — ABNORMAL LOW (ref 0.3–1.2)
Total Protein: 7.9 g/dL (ref 6.0–8.3)

## 2011-11-03 LAB — CBC WITH DIFFERENTIAL/PLATELET
Hemoglobin: 11.8 g/dL — ABNORMAL LOW (ref 12.0–15.0)
Lymphocytes Relative: 29 % (ref 12–46)
Lymphs Abs: 3 10*3/uL (ref 0.7–4.0)
Monocytes Relative: 5 % (ref 3–12)
Neutro Abs: 6.1 10*3/uL (ref 1.7–7.7)
Neutrophils Relative %: 60 % (ref 43–77)
Platelets: 419 10*3/uL — ABNORMAL HIGH (ref 150–400)
RBC: 4.29 MIL/uL (ref 3.87–5.11)
WBC: 10.2 10*3/uL (ref 4.0–10.5)

## 2011-11-03 NOTE — ED Notes (Signed)
Pt reports having a (L) Femoral Popliteal Bypass 10/12/2011, pt c/o "knot" to surgical site x1 week, reports when she wiped tonight she felt a string and site started "gushing" blood. Pt applied pressure to stop the bleeding, pt unsure if the bleeding has resumed.

## 2011-11-03 NOTE — ED Notes (Signed)
Pt verbalized  Discharge & follow up care that Darrick Penna, MD discussed with pt

## 2011-11-03 NOTE — ED Provider Notes (Signed)
Upon arrival to the emergency room patient was seen by Dr. Darrick Penna. He evaluated the patient's femoropopliteal bypass and surgical site. He repaired the suture in discharge patient home. I had no active role in the patient's care  Gwyneth Sprout, MD 11/03/11 2038

## 2011-11-03 NOTE — ED Notes (Signed)
PLEASE PAGE DR. FIELDS UPON PT'S ARRIVAL TO ED ROOM. PAGER # [phone]

## 2011-11-03 NOTE — Progress Notes (Signed)
[facility] [place]  Progress Note Bypass Surgery  History of Present Illness  April Branch is a 66 y.o. female who is S/P left fem pop with Goretex several weeks ago.  Noticed some bloody drainage left groin when having a BM earlier today.  No fever or chills Physical Examination  Filed Vitals:   11/03/11 1926  BP: 120/69  Pulse: 90  Temp: 98.8 F (37.1 C)  TempSrc: Oral  Resp: 18  SpO2: 97%    2+ DP pulse left foot, 1 mm opening at inferior aspect of groin no drainage expressible, 1 suture seen and removed, dry gauze applied, fluctuant area at below knee incision no erythema or drainage most likely seroma  Assessment: Pt. Doing well Post-op pain is controlled Drainage was probably from suture spitting  Plan: wash left groin with soap and water twice daily Cover with dry gauze  Follow up in 2-3 weeks  Fabienne Bruns, MD [facility] [place] Office: [phone] Pager: [phone]

## 2011-11-03 NOTE — ED Notes (Addendum)
This RN spoke w/on call service to page Dr. Darrick Penna, waiting for return call. Pt reports Dr. Darrick Penna requested we page him upon pt's arrival

## 2011-11-04 ENCOUNTER — Encounter: Payer: Self-pay | Admitting: Vascular Surgery

## 2011-11-04 ENCOUNTER — Telehealth: Payer: Self-pay

## 2011-11-04 ENCOUNTER — Telehealth: Payer: Self-pay | Admitting: Vascular Surgery

## 2011-11-04 NOTE — Telephone Encounter (Addendum)
Message copied by Shari Prows on Wed Nov 04, 2011 10:02 AM ------      Message from: Melene Plan      Created: Wed Nov 04, 2011  9:25 AM                   ----- Message -----         From: Sherren Kerns, MD         Sent: 11/03/2011   8:29 PM           To: Reuel Derby, Melene Plan, RN, #            Pt seen in ER for left groin drainage post op visit      Cancel appt this Thursday reschedule for 2-3 weeks            Leonette Most  I rescheduled the appt for the above pt for 11/26/11 at 2pm. I cancelled the appt for 11/05/11. I left messages for pt on both ph#'s and also mailed an appt letter.awt

## 2011-11-04 NOTE — Telephone Encounter (Signed)
Pt. Called to request pain medication for c/o pain/ "like a toothache" in left lower leg(ankle to BK incision). States burns/stings., denies redness. Asking for "Norco 10/500mg  tab". Stated she saw Dr. Darrick Penna in the ER last eve due to drainage in left groin, but did not ask for pain medication at that time.  Informed pt. Will d/w Dr. Darrick Penna and call her tomorrow.  Agrees w/ plan.

## 2011-11-05 ENCOUNTER — Ambulatory Visit: Payer: Medicare Other | Admitting: Vascular Surgery

## 2011-11-05 NOTE — Telephone Encounter (Signed)
RE: pain medication      Sherren Kerns, MD     Sent: Thu November 05, 2011 10:25 AM    To: Conley Simmonds Taline Nass, RN       ACELYN BASHAM    MRN: [id number] DOB: 1945-06-21    Pt Home: [phone]               Message     No further pain meds         April Branch

## 2011-11-09 ENCOUNTER — Ambulatory Visit: Payer: Medicare Other | Admitting: Family Medicine

## 2011-11-12 ENCOUNTER — Ambulatory Visit: Payer: Medicare Other | Admitting: Family Medicine

## 2011-11-12 DIAGNOSIS — IMO0001 Reserved for inherently not codable concepts without codable children: Secondary | ICD-10-CM

## 2011-11-12 NOTE — Progress Notes (Signed)
Encounter created in error. Pt already has PCP and this appointment was cancelled.

## 2011-11-14 ENCOUNTER — Emergency Department (INDEPENDENT_AMBULATORY_CARE_PROVIDER_SITE_OTHER)
Admission: EM | Admit: 2011-11-14 | Discharge: 2011-11-14 | Disposition: A | Discharge status: Home / Self Care | Payer: Medicare Other | Source: Home / Self Care | Attending: Emergency Medicine | Admitting: Emergency Medicine

## 2011-11-14 ENCOUNTER — Encounter (HOSPITAL_COMMUNITY): Payer: Self-pay | Admitting: Emergency Medicine

## 2011-11-14 DIAGNOSIS — M129 Arthropathy, unspecified: Secondary | ICD-10-CM

## 2011-11-14 DIAGNOSIS — M1990 Unspecified osteoarthritis, unspecified site: Secondary | ICD-10-CM

## 2011-11-14 DIAGNOSIS — T07XXXA Unspecified multiple injuries, initial encounter: Secondary | ICD-10-CM

## 2011-11-14 MED ORDER — MELOXICAM 7.5 MG PO TABS: 7.5000 mg | ORAL_TABLET | Freq: Every day | ORAL | Status: DC

## 2011-11-14 NOTE — ED Provider Notes (Signed)
History     CSN: [id number]  Arrival date & time 11/14/11  4098   First MD Initiated Contact with Patient 11/14/11 1005      Chief Complaint  Patient presents with  . Fall    fell last saturday    (Consider location/radiation/quality/duration/timing/severity/associated sxs/prior treatment) HPI Comments: Patient reports a mechanical fall 1 week ago, states that she tripped over a plastic basin, and fell on the pavement. Sustained contusions over right arm, right chest, bilateral lower extremities. Reports diffuse, achy pain in these areas are worse with ambulation, palpitation. Has been taking 2 Aleve twice a day without improvement. Denies head, neck, back injury, loss of consciousness. No headaches. Denies chest pain, shortness of breath, presyncope, syncope causing her fall. Patient is requesting "something stronger" for her pain. States that she ran out of the Norco prescribed to her. She said she is in the process of trying to find a primary care physician, but has had difficulty because she is on Medicaid.  ROS as noted in HPI. All other ROS negative.   Patient is a 66 y.o. female presenting with fall. The history is provided by the patient. No language interpreter was used.  Fall The accident occurred more than 1 week ago. The fall occurred while walking. She landed on concrete. There was no blood loss. She was ambulatory at the scene. Pertinent negatives include no fever, no numbness, no nausea, no vomiting, no headaches, no loss of consciousness and no tingling. The symptoms are aggravated by ambulation. She has tried NSAIDs for the symptoms. The treatment provided no relief.    Past Medical History  Diagnosis Date  . Hyperlipidemia   . Arthritis   . Hypothyroidism   . GERD (gastroesophageal reflux disease)   . Anxiety   . Diabetes mellitus age 69    insulin dependent  . Peripheral vascular disease   . Lumbar disc disease   . Insulin dependent type 2 diabetes mellitus,  controlled   . GERD 07/20/2006  . CAD (coronary artery disease)   . CAD (coronary artery disease) of bypass graft   . Vertigo   . History of transient ischemic attack (TIA)   . Myocardial infarction     AGE 39    . Anginal pain   . Hypertension   . Depression   . COPD 12/14/2008    PATIENT DENIES    . Stroke     3 MINI STROKES  RT SIDED WEAKNESS  . Personal history of malignant neoplasm of kidney 12/14/2008    Laparoscopic biopsy and cryoablation 7/08 Dr. Normajean Baxter   . Headache   . 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 Neuromuscular disorder     PERIPHERAL NEUROPATHY  . Anemia     Past Surgical History  Procedure Date  . Appendectomy   . Abdominal hysterectomy   . Knee surgery   . Cholecystectomy   . Lung surgery     lung nodule removed from the right side  . Laparascopic cryoablation of left kidney 08/2006    Dr. Sherre Poot for renal cell cancer  . Bladder suspension   . Shoulder surgery   . Foot surgery   . Pr vein bypass graft,aorto-fem-pop 05/27/2010  . Cardiac catheterization   . Femoral-popliteal bypass graft 10/12/2011    Procedure: BYPASS GRAFT FEMORAL-POPLITEAL ARTERY;  Surgeon: Sherren Kerns, MD;  Location: [facility] OR;  Service: Vascular;  Laterality: Left;  Left Femoral-Popliteal Bypass Graft using 6mm x 80cm  Propaten Graft with intraop arteriogram times one.  . Intraoperative arteriogram 10/12/2011    Procedure: INTRA OPERATIVE ARTERIOGRAM;  Surgeon: Sherren Kerns, MD;  Location: [facility] OR;  Service: Vascular;  Laterality: Left;    Family History  Problem Relation Age of Onset  . Heart disease Mother   . Heart disease Father   . Heart disease Sister   . Lung cancer Mother   . Lung cancer Father     History  Substance Use Topics  . Smoking status: Former Smoker -- 50 years    Types: Cigarettes    Quit date: 07/12/2011  . Smokeless tobacco: Not on file  . Alcohol Use: No    OB History    Grav Para Term Preterm Abortions TAB SAB Ect Mult Living                   Review of Systems  Constitutional: Negative for fever.  Gastrointestinal: Negative for nausea and vomiting.  Neurological: Negative for tingling, loss of consciousness, numbness and headaches.    Allergies  Niacin-simvastatin er  Home Medications   Current Outpatient Rx  Name Route Sig Dispense Refill  . AMITRIPTYLINE HCL 75 MG PO TABS Oral Take 75 mg by mouth at bedtime.    . CLOPIDOGREL BISULFATE 75 MG PO TABS Oral Take 75 mg by mouth daily.      . CYCLOBENZAPRINE HCL 10 MG PO TABS Oral Take 10 mg by mouth every 8 (eight) hours as needed. For muscle spasms    . DIAZEPAM 2 MG PO TABS Oral Take 2 mg by mouth every 6 (six) hours as needed.    Marland Kitchen FERROUS SULFATE 325 (65 FE) MG PO TABS Oral Take 325 mg by mouth daily with breakfast.    . INSULIN ASPART 100 UNIT/ML [place] SOLN Subcutaneous Inject 4-6 Units into the skin 2 (two) times daily.     . INSULIN GLARGINE 100 UNIT/ML [place] SOLN Subcutaneous Inject 60 Units into the skin at bedtime.      Marland Kitchen LANSOPRAZOLE 30 MG PO CPDR Oral Take 30 mg by mouth daily.      Marland Kitchen LEVOTHYROXINE SODIUM 88 MCG PO TABS Oral Take 88 mcg by mouth daily.    Marland Kitchen MECLIZINE HCL 25 MG PO TABS Oral Take 25 mg by mouth 2 (two) times daily as needed. dizziness    . METFORMIN HCL 850 MG PO TABS Oral Take 850 mg by mouth 2 (two) times daily with a meal.    . METOPROLOL TARTRATE 25 MG PO TABS Oral Take 25 mg by mouth 2 (two) times daily.    Marland Kitchen PRAVASTATIN SODIUM 20 MG PO TABS Oral Take 20 mg by mouth daily.    Marland Kitchen PROMETHAZINE HCL 25 MG PO TABS Oral Take 25 mg by mouth every 6 (six) hours as needed. For nausea/vomiting    . TOLTERODINE TARTRATE ER 4 MG PO CP24 Oral Take 1 capsule (4 mg total) by mouth daily. 30 capsule 0  . MELOXICAM 7.5 MG PO TABS Oral Take 1 tablet (7.5 mg total) by mouth daily. 14 tablet 0    BP 145/75  Pulse 72  Temp 98.5 F (36.9 C) (Oral)  Resp 18  SpO2 99%  Physical Exam  Nursing note and vitals reviewed. Constitutional: She is oriented  to person, place, and time. She appears well-developed and well-nourished. No distress.  HENT:  Head: Normocephalic and atraumatic.  Eyes: Conjunctivae normal and EOM are normal.  Neck: Normal range of motion.  Cardiovascular:  Normal rate, regular rhythm, normal heart sounds and intact distal pulses.   Pulmonary/Chest: Effort normal and breath sounds normal. She exhibits no crepitus.  Abdominal: She exhibits no distension.  Musculoskeletal: Normal range of motion.       Cervical back: Normal.       Thoracic back: Normal.       Lumbar back: Normal.       multiple contusions in various stages of healing over chest, right arm, right lower extremity. No bony defect, tenderness.  healing surgical scar left medial thigh. No signs of infection.  Neurological: She is alert and oriented to person, place, and time. Coordination normal.       Normal gait  Skin: Skin is warm and dry.  Psychiatric: She has a normal mood and affect. Her behavior is normal. Judgment and thought content normal.    ED Course  Procedures (including critical care time)  Labs Reviewed - No data to display No results found.   1. Multiple contusions   2. Arthritis     MDM  Previous records, [address] narcotic database reviewed. Patient was seen at the urgent care on 9/3 status post fall with ankle and leg pain. Found to have ankle sprain, lower leg contusion. Sent home with norco 10/325.  Seen on 9/9 at [facility] urgent care requesting refills of hydrocodone, Ativan, Detrol. Detrol refilled, Started on Valium 2 mg daily #30 for anxiety and also to prevent benzo withdrawal. Referred to chronic pain management.  Seen in ED on 9/10 by Dr. Darrick Penna, vascular surgeon for seroma from left femoropopliteal surgery. no further pain meds required per surgeon on 9/12.   Patient's primary concern today seems to be prescription management. She did not mention any concerns with the surgical site on her leg. It appears to be healing  well. She has a followup appointment with her vascular surgeon on 10/3. Given that the fall was a week ago, and patient has been ambulatory, doubt that she sustained any fracture. Deferring imaging. Doubt neurologic or cardiac event causing her fall based on her history. She does have multiple contusions, which are in the process of healing. She states that she doesn't recall any NSAIDs prescribed her from her visit on 9/9. Discussed with her that were unable to fill narcotic prescriptions for chronic pain. Will try Mobic 7.5 daily x 2 weeks. We'll refer her to a local primary care practices for ongoing management. Pt left prior to receiving discharge instructions and rx.  Luiz Blare, MD 11/14/11 2145

## 2011-11-14 NOTE — ED Notes (Signed)
Pt c/o of pain all over. Pt states that she tripped over a bin last Saturday falling on the pavement. Pt has multiple bruises. Recent surgery on left leg, knot on front of leg concerns about ? Blood clot.

## 2011-11-24 ENCOUNTER — Encounter (HOSPITAL_COMMUNITY): Payer: Self-pay | Admitting: Emergency Medicine

## 2011-11-24 ENCOUNTER — Emergency Department (INDEPENDENT_AMBULATORY_CARE_PROVIDER_SITE_OTHER): Payer: Self-pay

## 2011-11-24 ENCOUNTER — Emergency Department (HOSPITAL_COMMUNITY): Payer: Self-pay

## 2011-11-24 ENCOUNTER — Emergency Department (HOSPITAL_COMMUNITY)
Admission: EM | Admit: 2011-11-24 | Discharge: 2011-11-24 | Disposition: A | Discharge status: Home / Self Care | Payer: Self-pay | Source: Home / Self Care | Attending: Family Medicine | Admitting: Family Medicine

## 2011-11-24 DIAGNOSIS — S161XXA Strain of muscle, fascia and tendon at neck level, initial encounter: Secondary | ICD-10-CM

## 2011-11-24 DIAGNOSIS — S139XXA Sprain of joints and ligaments of unspecified parts of neck, initial encounter: Secondary | ICD-10-CM

## 2011-11-24 DIAGNOSIS — Z041 Encounter for examination and observation following transport accident: Secondary | ICD-10-CM

## 2011-11-24 MED ORDER — DICLOFENAC POTASSIUM 50 MG PO TABS: 50.0000 mg | ORAL_TABLET | Freq: Three times a day (TID) | ORAL | Status: DC

## 2011-11-24 MED ORDER — CYCLOBENZAPRINE HCL 5 MG PO TABS: 5.0000 mg | ORAL_TABLET | Freq: Three times a day (TID) | ORAL | Status: DC | PRN
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
## 2011-11-25 ENCOUNTER — Encounter: Payer: Self-pay | Admitting: Vascular Surgery

## 2011-11-26 ENCOUNTER — Ambulatory Visit (INDEPENDENT_AMBULATORY_CARE_PROVIDER_SITE_OTHER): Payer: Medicare Other | Admitting: Vascular Surgery

## 2011-11-26 ENCOUNTER — Encounter: Payer: Self-pay | Admitting: Vascular Surgery

## 2011-11-26 VITALS — BP 141/67 | HR 83 | Resp 16 | Ht 64.0 in | Wt 142.0 lb

## 2011-11-26 DIAGNOSIS — Z48812 Encounter for surgical aftercare following surgery on the circulatory system: Secondary | ICD-10-CM

## 2011-11-26 DIAGNOSIS — I70219 Atherosclerosis of native arteries of extremities with intermittent claudication, unspecified extremity: Secondary | ICD-10-CM

## 2011-11-26 DIAGNOSIS — I739 Peripheral vascular disease, unspecified: Secondary | ICD-10-CM

## 2011-11-26 NOTE — Progress Notes (Signed)
Ankle brachial index performed @ [facility] 11/26/2011

## 2011-11-26 NOTE — Progress Notes (Signed)
Patient is a 66 year old female who is status post left femoral to below-knee popliteal bypass which was a redo operation. This was done with Propaten. She returns today for further followup. She denies any claudication symptoms. She has no limitations to her walking distance. She does complain of some burning sensations along the medial aspect of her left calf adjacent to her below-knee incision  Physical exam: Filed Vitals:   11/26/11 1435  BP: 141/67  Pulse: 83  Resp: 16  Height: 5\' 4"  (1.626 m)  Weight: 142 lb (64.411 kg)  SpO2: 100%   Left lower extremity: Well-healed left groin incision well-healed left below-knee incision no drainage or erythema, 2+ left dorsalis pedis pulse  Data: The patient had bilateral ABIs performed today which were 1.23 on the left 0.77 on the right  Assessment: Patent left lower extremity bypass, evidence of peripheral arterial disease right lower extremity but asymptomatic  Plan: Patient will be placed in our bypass protocol at this point. She'll return for followup in one month for graft duplex. She does not need any further pain medication.  Fabienne Bruns, MD [facility] Office: [phone] Pager: [phone]

## 2011-11-27 NOTE — Addendum Note (Signed)
Addended by: Sharee Pimple on: 11/27/2011 09:15 AM   Modules accepted: Orders

## 2011-11-30 ENCOUNTER — Emergency Department (INDEPENDENT_AMBULATORY_CARE_PROVIDER_SITE_OTHER)
Admission: EM | Admit: 2011-11-30 | Discharge: 2011-11-30 | Disposition: A | Discharge status: Home / Self Care | Payer: Self-pay | Source: Home / Self Care

## 2011-11-30 ENCOUNTER — Encounter (HOSPITAL_COMMUNITY): Payer: Self-pay

## 2011-11-30 ENCOUNTER — Emergency Department (INDEPENDENT_AMBULATORY_CARE_PROVIDER_SITE_OTHER): Payer: Medicare Other

## 2011-11-30 DIAGNOSIS — S139XXA Sprain of joints and ligaments of unspecified parts of neck, initial encounter: Secondary | ICD-10-CM

## 2011-11-30 DIAGNOSIS — S161XXA Strain of muscle, fascia and tendon at neck level, initial encounter: Secondary | ICD-10-CM

## 2011-11-30 MED ORDER — HYDROCODONE-ACETAMINOPHEN 5-325 MG PO TABS: 2.0000 | ORAL_TABLET | ORAL | Status: DC | PRN
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
## 2011-12-02 NOTE — ED Provider Notes (Signed)
Medical screening examination/treatment/procedure(s) were performed by non-physician practitioner and as supervising physician I was immediately available for consultation/collaboration.  Leslee Home, M.D.   Reuben Likes, MD 12/02/11 [phone]

## 2011-12-04 ENCOUNTER — Encounter (HOSPITAL_COMMUNITY): Payer: Self-pay | Admitting: Emergency Medicine

## 2011-12-04 ENCOUNTER — Emergency Department (INDEPENDENT_AMBULATORY_CARE_PROVIDER_SITE_OTHER)
Admission: EM | Admit: 2011-12-04 | Discharge: 2011-12-04 | Disposition: A | Discharge status: Home / Self Care | Payer: Self-pay | Source: Home / Self Care

## 2011-12-04 DIAGNOSIS — M542 Cervicalgia: Secondary | ICD-10-CM

## 2011-12-04 DIAGNOSIS — S139XXA Sprain of joints and ligaments of unspecified parts of neck, initial encounter: Secondary | ICD-10-CM

## 2011-12-04 MED ORDER — TRAMADOL HCL 50 MG PO TABS: 50.0000 mg | ORAL_TABLET | Freq: Four times a day (QID) | ORAL | Status: DC | PRN
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
## 2011-12-04 NOTE — ED Provider Notes (Signed)
History     CSN: [id number]  Arrival date & time 12/04/11  4098   None     Chief Complaint  Patient presents with  . Shoulder Pain    (Consider location/radiation/quality/duration/timing/severity/associated sxs/prior treatment) Patient is a 66 y.o. female presenting with shoulder pain. The history is provided by the patient. No language interpreter was used.  Shoulder Pain This is a new problem. Episode onset: 4 days ago. The problem occurs constantly. The problem has been gradually worsening. Nothing aggravates the symptoms.  no relief with hydrocodone.   (Pt has had multiple pain related visitis.   I advised her she needs a primary MD if she requires on going pain medications  Past Medical History  Diagnosis Date  . Hyperlipidemia   . Arthritis   . Hypothyroidism   . GERD (gastroesophageal reflux disease)   . Anxiety   . Diabetes mellitus age 25    insulin dependent  . Peripheral vascular disease   . Lumbar disc disease   . Insulin dependent type 2 diabetes mellitus, controlled   . GERD 07/20/2006  . CAD (coronary artery disease)   . CAD (coronary artery disease) of bypass graft   . Vertigo   . History of transient ischemic attack (TIA)   . Myocardial infarction     AGE 65    . Anginal pain   . Hypertension   . Depression   . COPD 12/14/2008    PATIENT DENIES    . Stroke     3 MINI STROKES  RT SIDED WEAKNESS  . Personal history of malignant neoplasm of kidney 12/14/2008    Laparoscopic biopsy and cryoablation 7/08 Dr. Normajean Baxter   . Headache   . 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 Neuromuscular disorder     PERIPHERAL NEUROPATHY  . Anemia     Past Surgical History  Procedure Date  . Appendectomy   . Abdominal hysterectomy   . Knee surgery   . Cholecystectomy   . Lung surgery     lung nodule removed from the right side  . Laparascopic cryoablation of left kidney 08/2006    Dr. Sherre Poot for renal cell cancer  . Bladder suspension   . Shoulder  surgery   . Foot surgery   . Pr vein bypass graft,aorto-fem-pop 05/27/2010  . Cardiac catheterization   . Femoral-popliteal bypass graft 10/12/2011    Procedure: BYPASS GRAFT FEMORAL-POPLITEAL ARTERY;  Surgeon: Sherren Kerns, MD;  Location: [facility] OR;  Service: Vascular;  Laterality: Left;  Left Femoral-Popliteal Bypass Graft using 6mm x 80cm Propaten Graft with intraop arteriogram times one.  . Intraoperative arteriogram 10/12/2011    Procedure: INTRA OPERATIVE ARTERIOGRAM;  Surgeon: Sherren Kerns, MD;  Location: [facility] OR;  Service: Vascular;  Laterality: Left;    Family History  Problem Relation Age of Onset  . Heart disease Mother   . Heart disease Father   . Heart disease Sister   . Lung cancer Mother   . Lung cancer Father     History  Substance Use Topics  . Smoking status: Former Smoker -- 50 years    Types: Cigarettes    Quit date: 07/12/2011  . Smokeless tobacco: Not on file  . Alcohol Use: No    OB History    Grav Para Term Preterm Abortions TAB SAB Ect Mult Living                  Review of Systems  Musculoskeletal: Positive for myalgias.  All other systems reviewed and are negative.    Allergies  Niacin-simvastatin er  Home Medications   Current Outpatient Rx  Name Route Sig Dispense Refill  . INSULIN ASPART 100 UNIT/ML [place] SOLN Subcutaneous Inject 4-6 Units into the skin 2 (two) times daily.     . INSULIN GLARGINE 100 UNIT/ML [place] SOLN Subcutaneous Inject 60 Units into the skin at bedtime.      Marland Kitchen METFORMIN HCL 850 MG PO TABS Oral Take 850 mg by mouth 2 (two) times daily with a meal.    . AMITRIPTYLINE HCL 75 MG PO TABS Oral Take 75 mg by mouth at bedtime.    . CLOPIDOGREL BISULFATE 75 MG PO TABS Oral Take 75 mg by mouth daily.      . CYCLOBENZAPRINE HCL 10 MG PO TABS Oral Take 10 mg by mouth every 8 (eight) hours as needed. For muscle spasms    . CYCLOBENZAPRINE HCL 5 MG PO TABS Oral Take 1 tablet (5 mg total) by mouth 3 (three) times daily as needed for  muscle spasms. 30 tablet 0  . DIAZEPAM 2 MG PO TABS Oral Take 2 mg by mouth every 6 (six) hours as needed.    Marland Kitchen DICLOFENAC POTASSIUM 50 MG PO TABS Oral Take 1 tablet (50 mg total) by mouth 3 (three) times daily. For neck pain 30 tablet 0  . FERROUS SULFATE 325 (65 FE) MG PO TABS Oral Take 325 mg by mouth daily with breakfast.    . HYDROCODONE-ACETAMINOPHEN 5-325 MG PO TABS Oral Take 2 tablets by mouth every 4 (four) hours as needed for pain. 10 tablet 0  . LANSOPRAZOLE 30 MG PO CPDR Oral Take 30 mg by mouth daily.      Marland Kitchen LEVOTHYROXINE SODIUM 88 MCG PO TABS Oral Take 88 mcg by mouth daily.    Marland Kitchen MECLIZINE HCL 25 MG PO TABS Oral Take 25 mg by mouth 2 (two) times daily as needed. dizziness    . MELOXICAM 7.5 MG PO TABS Oral Take 1 tablet (7.5 mg total) by mouth daily. 14 tablet 0  . METOPROLOL TARTRATE 25 MG PO TABS Oral Take 25 mg by mouth 2 (two) times daily.    . ALEVE PO Oral Take by mouth.    Marland Kitchen PRAVASTATIN SODIUM 20 MG PO TABS Oral Take 20 mg by mouth daily.    Marland Kitchen PROMETHAZINE HCL 25 MG PO TABS Oral Take 25 mg by mouth every 6 (six) hours as needed. For nausea/vomiting    . TOLTERODINE TARTRATE ER 4 MG PO CP24 Oral Take 1 capsule (4 mg total) by mouth daily. 30 capsule 0    BP 141/89  Pulse 77  Temp 98.1 F (36.7 C) (Oral)  Resp 18  SpO2 100%  Physical Exam  Nursing note and vitals reviewed. Constitutional: She is oriented to person, place, and time. She appears well-developed and well-nourished.  HENT:  Head: Normocephalic.  Right Ear: External ear normal.  Eyes: Pupils are equal, round, and reactive to light.  Neck: Normal range of motion.  Cardiovascular: Normal rate.   Pulmonary/Chest: Effort normal.  Musculoskeletal: Normal range of motion.  Neurological: She is alert and oriented to person, place, and time.  Skin: Skin is warm.  Psychiatric: She has a normal mood and affect.    ED Course  Procedures (including critical care time)  Labs Reviewed - No data to display No  results found.   No diagnosis found.    MDM  rx for [employer] [place], [place] 12/04/11 [address] [place], [place] 12/04/11 1053

## 2011-12-05 NOTE — ED Provider Notes (Signed)
Medical screening examination/treatment/procedure(s) were performed by resident physician or non-physician practitioner and as supervising physician I was immediately available for consultation/collaboration.   Barkley Bruns MD.    Linna Hoff, MD 12/05/11 1734

## 2011-12-25 ENCOUNTER — Other Ambulatory Visit: Payer: Self-pay | Admitting: Family Medicine

## 2011-12-30 ENCOUNTER — Encounter: Payer: Self-pay | Admitting: Neurosurgery

## 2011-12-31 ENCOUNTER — Ambulatory Visit (INDEPENDENT_AMBULATORY_CARE_PROVIDER_SITE_OTHER): Payer: Medicare Other | Admitting: Neurosurgery

## 2011-12-31 ENCOUNTER — Encounter (INDEPENDENT_AMBULATORY_CARE_PROVIDER_SITE_OTHER): Payer: Medicare Other | Admitting: *Deleted

## 2011-12-31 ENCOUNTER — Encounter: Payer: Self-pay | Admitting: Neurosurgery

## 2011-12-31 VITALS — BP 148/75 | HR 78 | Resp 16 | Ht 61.0 in | Wt 143.0 lb

## 2011-12-31 DIAGNOSIS — Z48812 Encounter for surgical aftercare following surgery on the circulatory system: Secondary | ICD-10-CM

## 2011-12-31 DIAGNOSIS — I70219 Atherosclerosis of native arteries of extremities with intermittent claudication, unspecified extremity: Secondary | ICD-10-CM

## 2011-12-31 DIAGNOSIS — I739 Peripheral vascular disease, unspecified: Secondary | ICD-10-CM

## 2011-12-31 NOTE — Progress Notes (Signed)
[facility] Office Note  CC: Three-month postop followup status post left femoral to below the knee popliteal bypass Referring Physician: Fields  History of Present Illness: 66 year old female patient status post left femoral to below the knee popliteal bypass which was a redo and done with Propaten. The patient reports no claudication no rest pain and has no open ulcerations. The patient is very happy with the outcome of her surgery this point.  Past Medical History  Diagnosis Date  . Hyperlipidemia   . Arthritis   . Hypothyroidism   . GERD (gastroesophageal reflux disease)   . Anxiety   . Diabetes mellitus age 9    insulin dependent  . Peripheral vascular disease   . Lumbar disc disease   . Insulin dependent type 2 diabetes mellitus, controlled   . GERD 07/20/2006  . CAD (coronary artery disease)   . CAD (coronary artery disease) of bypass graft   . Vertigo   . History of transient ischemic attack (TIA)   . Myocardial infarction     AGE 8    . Anginal pain   . Hypertension   . Depression   . COPD 12/14/2008    PATIENT DENIES    . Stroke     3 MINI STROKES  RT SIDED WEAKNESS  . Personal history of malignant neoplasm of kidney 12/14/2008    Laparoscopic biopsy and cryoablation 7/08 Dr. Normajean Baxter   . Headache   . 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 Neuromuscular disorder     PERIPHERAL NEUROPATHY  . Anemia     ROS: [x]  Positive   [ ]  Denies    General: [ ]  Weight loss, [ ]  Fever, [ ]  chills Neurologic: [ ]  Dizziness, [ ]  Blackouts, [ ]  Seizure [ ]  Stroke, [ ]  "Mini stroke", [ ]  Slurred speech, [ ]  Temporary blindness; [ ]  weakness in arms or legs, [ ]  Hoarseness Cardiac: [ ]  Chest pain/pressure, [ ]  Shortness of breath at rest [ ]  Shortness of breath with exertion, [ ]  Atrial fibrillation or irregular heartbeat Vascular: [ ]  Pain in legs with walking, [ ]  Pain in legs at rest, [ ]  Pain in legs at night,  [ ]  Non-healing  ulcer, [ ]  Blood clot in vein/DVT,   Pulmonary: [ ]  Home oxygen, [ ]  Productive cough, [ ]  Coughing up blood, [ ]  Asthma,  [ ]  Wheezing Musculoskeletal:  [ ]  Arthritis, [ ]  Low back pain, [ ]  Joint pain Hematologic: [ ]  Easy Bruising, [ ]  Anemia; [ ]  Hepatitis Gastrointestinal: [ ]  Blood in stool, [ ]  Gastroesophageal Reflux/heartburn, [ ]  Trouble swallowing Urinary: [ ]  chronic Kidney disease, [ ]  on HD - [ ]  MWF or [ ]  TTHS, [ ]  Burning with urination, [ ]  Difficulty urinating Skin: [ ]  Rashes, [ ]  Wounds Psychological: [ ]  Anxiety, [ ]  Depression   Social History History  Substance Use Topics  . Smoking status: Former Smoker -- 50 years    Types: Cigarettes    Quit date: 07/12/2011  . Smokeless tobacco: Not on file  . Alcohol Use: No    Family History Family History  Problem Relation Age of Onset  . Heart disease Mother   . Heart disease Father   . Heart disease Sister   . Lung cancer Mother   . Lung cancer Father     Allergies  Allergen Reactions  . Niacin-Simvastatin Er Hives  Current Outpatient Prescriptions  Medication Sig Dispense Refill  . amitriptyline (ELAVIL) 75 MG tablet Take 75 mg by mouth at bedtime.      . clopidogrel (PLAVIX) 75 MG tablet Take 75 mg by mouth daily.        . cyclobenzaprine (FLEXERIL) 10 MG tablet Take 10 mg by mouth every 8 (eight) hours as needed. For muscle spasms      . cyclobenzaprine (FLEXERIL) 5 MG tablet Take 1 tablet (5 mg total) by mouth 3 (three) times daily as needed for muscle spasms.  30 tablet  0  . diazepam (VALIUM) 2 MG tablet Take 2 mg by mouth every 6 (six) hours as needed.      . diclofenac (CATAFLAM) 50 MG tablet Take 1 tablet (50 mg total) by mouth 3 (three) times daily. For neck pain  30 tablet  0  . ferrous sulfate (IRON SUPPLEMENT) 325 (65 FE) MG tablet Take 325 mg by mouth daily with breakfast.      . HYDROcodone-acetaminophen (NORCO/VICODIN) 5-325 MG per tablet Take 2 tablets by mouth every 4 (four) hours as  needed for pain.  10 tablet  0  . insulin aspart (NOVOLOG) 100 UNIT/ML injection Inject 4-6 Units into the skin 2 (two) times daily.       . insulin glargine (LANTUS) 100 UNIT/ML injection Inject 60 Units into the skin at bedtime.        . lansoprazole (PREVACID) 30 MG capsule Take 30 mg by mouth daily.        Marland Kitchen levothyroxine (SYNTHROID, LEVOTHROID) 88 MCG tablet Take 88 mcg by mouth daily.      . meclizine (ANTIVERT) 25 MG tablet Take 25 mg by mouth 2 (two) times daily as needed. dizziness      . meloxicam (MOBIC) 7.5 MG tablet Take 1 tablet (7.5 mg total) by mouth daily.  14 tablet  0  . metFORMIN (GLUCOPHAGE) 850 MG tablet Take 850 mg by mouth 2 (two) times daily with a meal.      . metoprolol tartrate (LOPRESSOR) 25 MG tablet Take 25 mg by mouth 2 (two) times daily.      . Naproxen Sodium (ALEVE PO) Take by mouth.      . pravastatin (PRAVACHOL) 20 MG tablet Take 20 mg by mouth daily.      . promethazine (PHENERGAN) 25 MG tablet Take 25 mg by mouth every 6 (six) hours as needed. For nausea/vomiting      . tolterodine (DETROL LA) 4 MG 24 hr capsule Take 1 capsule (4 mg total) by mouth daily.  30 capsule  0  . traMADol (ULTRAM) 50 MG tablet Take 1 tablet (50 mg total) by mouth every 6 (six) hours as needed for pain.  15 tablet  0  . [DISCONTINUED] Gabapentin (NEURONTIN PO) Take by mouth. New medicine, pt doesn't remember dosage        Physical Examination  Filed Vitals:   12/31/11 1554  BP: 148/75  Pulse: 78  Resp: 16    Body mass index is 27.02 kg/(m^2).  General:  WDWN in NAD Gait: Normal HEENT: WNL Eyes: Pupils equal Pulmonary: normal non-labored breathing , without Rales, rhonchi,  wheezing Cardiac: RRR, without  Murmurs, rubs or gallops; No carotid bruits Abdomen: soft, NT, no masses Skin: no rashes, ulcers noted Vascular Exam/Pulses: Palpable PT and DP pulses bilaterally  Extremities without ischemic changes, no Gangrene , no cellulitis; no open wounds;    Musculoskeletal: no muscle wasting or atrophy  Neurologic: A&O  X 3; Appropriate Affect ; SENSATION: normal; MOTOR FUNCTION:  moving all extremities equally. Speech is fluent/normal  Non-Invasive Vascular Imaging: Lower extremity duplex that showed a patent left lower extremity graft and the velocities greater 180 cm a second  ASSESSMENT/PLAN: Asymptomatic patient doing well, the patient will followup in 3 months for repeat duplex and ABIs, her questions were encouraged and answered, she is in agreement with this plan.  Lauree Chandler ANP  Clinic M.D.: Fields

## 2012-01-01 NOTE — Addendum Note (Signed)
Addended by: Sharee Pimple on: 01/01/2012 08:50 AM   Modules accepted: Orders

## 2012-03-12 ENCOUNTER — Emergency Department (HOSPITAL_COMMUNITY): Payer: Medicare HMO

## 2012-03-12 ENCOUNTER — Encounter (HOSPITAL_COMMUNITY): Payer: Self-pay | Admitting: Emergency Medicine

## 2012-03-12 ENCOUNTER — Inpatient Hospital Stay (HOSPITAL_COMMUNITY)
Admission: EM | Admit: 2012-03-12 | Discharge: 2012-03-13 | DRG: 092 | Disposition: A | Discharge status: Home / Self Care | Payer: Medicare HMO | Attending: Internal Medicine | Admitting: Internal Medicine

## 2012-03-12 DIAGNOSIS — R4182 Altered mental status, unspecified: Secondary | ICD-10-CM

## 2012-03-12 DIAGNOSIS — R5381 Other malaise: Secondary | ICD-10-CM

## 2012-03-12 DIAGNOSIS — I2510 Atherosclerotic heart disease of native coronary artery without angina pectoris: Secondary | ICD-10-CM | POA: Diagnosis present

## 2012-03-12 DIAGNOSIS — G92 Toxic encephalopathy: Secondary | ICD-10-CM | POA: Diagnosis present

## 2012-03-12 DIAGNOSIS — Z794 Long term (current) use of insulin: Secondary | ICD-10-CM

## 2012-03-12 DIAGNOSIS — T50995A Adverse effect of other drugs, medicaments and biological substances, initial encounter: Secondary | ICD-10-CM | POA: Diagnosis present

## 2012-03-12 DIAGNOSIS — T3995XA Adverse effect of unspecified nonopioid analgesic, antipyretic and antirheumatic, initial encounter: Secondary | ICD-10-CM | POA: Diagnosis present

## 2012-03-12 DIAGNOSIS — F17200 Nicotine dependence, unspecified, uncomplicated: Secondary | ICD-10-CM

## 2012-03-12 DIAGNOSIS — Z79899 Other long term (current) drug therapy: Secondary | ICD-10-CM

## 2012-03-12 DIAGNOSIS — J4489 Other specified chronic obstructive pulmonary disease: Secondary | ICD-10-CM | POA: Diagnosis present

## 2012-03-12 DIAGNOSIS — E785 Hyperlipidemia, unspecified: Secondary | ICD-10-CM | POA: Diagnosis present

## 2012-03-12 DIAGNOSIS — I739 Peripheral vascular disease, unspecified: Secondary | ICD-10-CM | POA: Diagnosis present

## 2012-03-12 DIAGNOSIS — E119 Type 2 diabetes mellitus without complications: Secondary | ICD-10-CM

## 2012-03-12 DIAGNOSIS — J449 Chronic obstructive pulmonary disease, unspecified: Secondary | ICD-10-CM | POA: Diagnosis present

## 2012-03-12 DIAGNOSIS — N179 Acute kidney failure, unspecified: Secondary | ICD-10-CM | POA: Diagnosis present

## 2012-03-12 DIAGNOSIS — Z7902 Long term (current) use of antithrombotics/antiplatelets: Secondary | ICD-10-CM

## 2012-03-12 DIAGNOSIS — R5383 Other fatigue: Secondary | ICD-10-CM

## 2012-03-12 DIAGNOSIS — E039 Hypothyroidism, unspecified: Secondary | ICD-10-CM

## 2012-03-12 DIAGNOSIS — G894 Chronic pain syndrome: Secondary | ICD-10-CM | POA: Diagnosis present

## 2012-03-12 DIAGNOSIS — Z87891 Personal history of nicotine dependence: Secondary | ICD-10-CM

## 2012-03-12 DIAGNOSIS — G929 Unspecified toxic encephalopathy: Principal | ICD-10-CM | POA: Diagnosis present

## 2012-03-12 DIAGNOSIS — F411 Generalized anxiety disorder: Secondary | ICD-10-CM | POA: Diagnosis present

## 2012-03-12 LAB — BLOOD GAS, ARTERIAL
Acid-base deficit: 1.2 mmol/L (ref 0.0–2.0)
Drawn by: 307971
O2 Content: 2 L/min
O2 Saturation: 90.9 %
TCO2: 21.7 mmol/L (ref 0–100)
pCO2 arterial: 42.2 mmHg (ref 35.0–45.0)
pO2, Arterial: 62.8 mmHg — ABNORMAL LOW (ref 80.0–100.0)

## 2012-03-12 LAB — GLUCOSE, CAPILLARY
Glucose-Capillary: 123 mg/dL — ABNORMAL HIGH (ref 70–99)
Glucose-Capillary: 118 mg/dL — ABNORMAL HIGH (ref 70–99)

## 2012-03-12 LAB — COMPREHENSIVE METABOLIC PANEL
ALT: 11 U/L (ref 0–35)
AST: 13 U/L (ref 0–37)
Albumin: 3.1 g/dL — ABNORMAL LOW (ref 3.5–5.2)
CO2: 25 mEq/L (ref 19–32)
Calcium: 9.1 mg/dL (ref 8.4–10.5)
GFR calc non Af Amer: 41 mL/min — ABNORMAL LOW (ref >90)
Sodium: 141 mEq/L (ref 135–145)

## 2012-03-12 LAB — URINALYSIS, ROUTINE W REFLEX MICROSCOPIC
Glucose, UA: NEGATIVE mg/dL
Leukocytes, UA: NEGATIVE
Nitrite: NEGATIVE
Protein, ur: NEGATIVE mg/dL
pH: 5.5 (ref 5.0–8.0)

## 2012-03-12 LAB — AMMONIA: Ammonia: 18 umol/L (ref 11–60)

## 2012-03-12 LAB — RAPID URINE DRUG SCREEN, HOSP PERFORMED
Amphetamines: NOT DETECTED
Barbiturates: NOT DETECTED
Benzodiazepines: POSITIVE — AB
Tetrahydrocannabinol: NOT DETECTED

## 2012-03-12 LAB — CBC WITH DIFFERENTIAL/PLATELET
Basophils Relative: 1 % (ref 0–1)
HCT: 34.1 % — ABNORMAL LOW (ref 36.0–46.0)
Hemoglobin: 11.3 g/dL — ABNORMAL LOW (ref 12.0–15.0)
Lymphocytes Relative: 33 % (ref 12–46)
Monocytes Relative: 6 % (ref 3–12)
Neutro Abs: 3.5 10*3/uL (ref 1.7–7.7)
Neutrophils Relative %: 59 % (ref 43–77)
RBC: 4.35 MIL/uL (ref 3.87–5.11)
WBC Morphology: INCREASED
WBC: 6.1 10*3/uL (ref 4.0–10.5)

## 2012-03-12 LAB — ETHANOL: Alcohol, Ethyl (B): <11 mg/dL (ref 0–11)

## 2012-03-12 LAB — ACETAMINOPHEN LEVEL: Acetaminophen (Tylenol), Serum: <15 ug/mL (ref 10–30)

## 2012-03-12 MED ORDER — INSULIN GLARGINE 100 UNIT/ML ~~LOC~~ SOLN: 50.0000 [IU] | Freq: Every day | SUBCUTANEOUS | Status: DC

## 2012-03-12 MED ORDER — SODIUM CHLORIDE 0.9 % IV SOLN
1000.0000 mL | INTRAVENOUS | Status: DC
  Administered 2012-03-13: 1000 mL via INTRAVENOUS

## 2012-03-12 MED ORDER — LEVOTHYROXINE SODIUM 88 MCG PO TABS
88.0000 ug | ORAL_TABLET | Freq: Every day | ORAL | Status: DC
  Administered 2012-03-13: 88 ug via ORAL
  Filled 2012-03-12 (×3): qty 1

## 2012-03-12 MED ORDER — SIMVASTATIN 5 MG PO TABS
5.0000 mg | ORAL_TABLET | Freq: Every day | ORAL | Status: DC
  Filled 2012-03-12: qty 1

## 2012-03-12 MED ORDER — INSULIN ASPART 100 UNIT/ML ~~LOC~~ SOLN: 0.0000 [IU] | Freq: Three times a day (TID) | SUBCUTANEOUS | Status: DC

## 2012-03-12 MED ORDER — AMITRIPTYLINE HCL 25 MG PO TABS
25.0000 mg | ORAL_TABLET | Freq: Every day | ORAL | Status: DC
  Filled 2012-03-12 (×2): qty 1

## 2012-03-12 MED ORDER — PANTOPRAZOLE SODIUM 40 MG PO TBEC
40.0000 mg | DELAYED_RELEASE_TABLET | Freq: Every day | ORAL | Status: DC
  Administered 2012-03-13: 40 mg via ORAL
  Filled 2012-03-12: qty 1

## 2012-03-12 MED ORDER — ONDANSETRON HCL 4 MG/2ML IJ SOLN: 4.0000 mg | Freq: Four times a day (QID) | INTRAMUSCULAR | Status: DC | PRN

## 2012-03-12 MED ORDER — NALOXONE HCL 1 MG/ML IJ SOLN
1.0000 mg | Freq: Once | INTRAMUSCULAR | Status: AC
  Administered 2012-03-12: 1 mg via INTRAVENOUS
  Filled 2012-03-12: qty 2

## 2012-03-12 MED ORDER — ALBUTEROL SULFATE (5 MG/ML) 0.5% IN NEBU: 2.5000 mg | INHALATION_SOLUTION | RESPIRATORY_TRACT | Status: DC | PRN

## 2012-03-12 MED ORDER — ENOXAPARIN SODIUM 40 MG/0.4ML ~~LOC~~ SOLN
40.0000 mg | SUBCUTANEOUS | Status: DC
  Administered 2012-03-12: 40 mg via SUBCUTANEOUS
  Filled 2012-03-12 (×2): qty 0.4

## 2012-03-12 MED ORDER — SODIUM CHLORIDE 0.9 % IV SOLN
1000.0000 mL | INTRAVENOUS | Status: DC
  Administered 2012-03-12: 1000 mL via INTRAVENOUS

## 2012-03-12 MED ORDER — ONDANSETRON HCL 4 MG PO TABS: 4.0000 mg | ORAL_TABLET | Freq: Four times a day (QID) | ORAL | Status: DC | PRN

## 2012-03-12 MED ORDER — ONDANSETRON HCL 4 MG/2ML IJ SOLN
INTRAMUSCULAR | Status: AC
  Administered 2012-03-12: 4 mg
  Filled 2012-03-12: qty 2

## 2012-03-12 MED ORDER — ACETAMINOPHEN 650 MG RE SUPP: 650.0000 mg | Freq: Four times a day (QID) | RECTAL | Status: DC | PRN

## 2012-03-12 MED ORDER — ACETAMINOPHEN 325 MG PO TABS: 650.0000 mg | ORAL_TABLET | Freq: Four times a day (QID) | ORAL | Status: DC | PRN

## 2012-03-12 MED ORDER — CLOPIDOGREL BISULFATE 75 MG PO TABS
75.0000 mg | ORAL_TABLET | Freq: Every day | ORAL | Status: DC
Start: 2012-03-12 — End: 2012-03-13
  Administered 2012-03-13: 75 mg via ORAL
  Filled 2012-03-12 (×2): qty 1

## 2012-03-12 MED ORDER — METOPROLOL TARTRATE 25 MG PO TABS
25.0000 mg | ORAL_TABLET | Freq: Two times a day (BID) | ORAL | Status: DC
  Administered 2012-03-13: 25 mg via ORAL
  Filled 2012-03-12 (×3): qty 1

## 2012-03-12 MED ORDER — DIAZEPAM 2 MG PO TABS: 2.0000 mg | ORAL_TABLET | Freq: Two times a day (BID) | ORAL | Status: DC | PRN

## 2012-03-12 NOTE — ED Notes (Signed)
Pt resting quietly at this time. Even, unlabored resp. Pt vss .

## 2012-03-12 NOTE — ED Notes (Signed)
Pt in from home via [facility]. Pt room mates unable to wake pt this am. Pt known diabetic, CBG 126. Pt room mates report c/o flu-like s/sx this week.

## 2012-03-12 NOTE — ED Notes (Signed)
NFA:OZHY<QM> Expected date:03/12/12<BR> Expected time: 9:55 AM<BR> Means of arrival:Ambulance<BR> Comments:<BR> AMS Flu like symptoms, VSS

## 2012-03-12 NOTE — H&P (Addendum)
[name]          History and Physical    PCP:   Quitman Livings, MD   Chief Complaint:  Decreased responsiveness  HPI: April Branch is a 67 year old white female with multiple medical part problems notable for insulin-dependent diabetes, chronic back pain, peripheral vascular disease, hypertension, COPD, anxiety, peripheral neuropathy was brought to the ER by EMS with the above-mentioned complaint. Her son called 911 since she was poorly responsive this morning and they were unable to arouse her. He also reported that she was having flulike symptoms for the past 3-4 days and was very sleepy and somewhat confused last pm. EMS arrived noted GCS of 5 with stable vital signs, her CBG was normal and she was brought to the ER. Upon evaluation the ER noted to have unremarkable labs, ABG, urine, CT head and chest x-ray. She is slowly starting to wake up but still unable to communicate. Review of home medications lists multiple sedating medications namely narcotics, Flexeril, Elavil, Phenergan, Valium etc.    Allergies:   Allergies  Allergen Reactions  . Niacin-Simvastatin Er Hives      Past Medical History  Diagnosis Date  . Hyperlipidemia   . Arthritis   . Hypothyroidism   . GERD (gastroesophageal reflux disease)   . Anxiety   . Diabetes mellitus age 80    insulin dependent  . Peripheral vascular disease   . Lumbar disc disease   . Insulin dependent type 2 diabetes mellitus, controlled   . GERD 07/20/2006  . CAD (coronary artery disease)   . CAD (coronary artery disease) of bypass graft   . Vertigo   . History of transient ischemic attack (TIA)   . Myocardial infarction     AGE 72    . Anginal pain   . Hypertension   . Depression   . COPD 12/14/2008    PATIENT DENIES    . Stroke     3 MINI STROKES  RT SIDED WEAKNESS  . Personal history of malignant neoplasm of kidney 12/14/2008    Laparoscopic biopsy and cryoablation 7/08 Dr. Normajean Baxter   . Headache   .  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 Neuromuscular disorder     PERIPHERAL NEUROPATHY  . Anemia     Past Surgical History  Procedure Date  . Appendectomy   . Abdominal hysterectomy   . Knee surgery   . Cholecystectomy   . Lung surgery     lung nodule removed from the right side  . Laparascopic cryoablation of left kidney 08/2006    Dr. Sherre Poot for renal cell cancer  . Bladder suspension   . Shoulder surgery   . Foot surgery   . Pr vein bypass graft,aorto-fem-pop 05/27/2010  . Cardiac catheterization   . Femoral-popliteal bypass graft 10/12/2011    Procedure: BYPASS GRAFT FEMORAL-POPLITEAL ARTERY;  Surgeon: Sherren Kerns, MD;  Location: [facility] OR;  Service: Vascular;  Laterality: Left;  Left Femoral-Popliteal Bypass Graft using 6mm x 80cm Propaten Graft with intraop arteriogram times one.  . Intraoperative arteriogram 10/12/2011    Procedure: INTRA OPERATIVE ARTERIOGRAM;  Surgeon: Sherren Kerns, MD;  Location: [facility] OR;  Service: Vascular;  Laterality: Left;    Prior to Admission medications   Medication Sig Start Date End Date Taking? Authorizing Provider  amitriptyline (ELAVIL) 75 MG tablet Take 75 mg by mouth at bedtime.    Historical Provider, MD  clopidogrel (PLAVIX) 75 MG tablet Take 75 mg  by mouth daily.      Historical Provider, MD  cyclobenzaprine (FLEXERIL) 10 MG tablet Take 10 mg by mouth every 8 (eight) hours as needed. For muscle spasms    Historical Provider, MD  cyclobenzaprine (FLEXERIL) 5 MG tablet Take 1 tablet (5 mg total) by mouth 3 (three) times daily as needed for muscle spasms. 11/24/11   Linna Hoff, MD  diazepam (VALIUM) 2 MG tablet Take 2 mg by mouth every 6 (six) hours as needed.    Historical Provider, MD  diclofenac (CATAFLAM) 50 MG tablet Take 1 tablet (50 mg total) by mouth 3 (three) times daily. For neck pain 11/24/11   Linna Hoff, MD  ferrous sulfate (IRON SUPPLEMENT) 325 (65 FE) MG tablet Take 325 mg by mouth daily with breakfast.     Historical Provider, MD  HYDROcodone-acetaminophen (NORCO/VICODIN) 5-325 MG per tablet Take 2 tablets by mouth every 4 (four) hours as needed for pain. 11/30/11   Elson Areas, PA  insulin aspart (NOVOLOG) 100 UNIT/ML injection Inject 4-6 Units into the skin 2 (two) times daily.     Historical Provider, MD  insulin glargine (LANTUS) 100 UNIT/ML injection Inject 60 Units into the skin at bedtime.      Historical Provider, MD  lansoprazole (PREVACID) 30 MG capsule Take 30 mg by mouth daily.      Historical Provider, MD  levothyroxine (SYNTHROID, LEVOTHROID) 88 MCG tablet Take 88 mcg by mouth daily.    Historical Provider, MD  meclizine (ANTIVERT) 25 MG tablet Take 25 mg by mouth 2 (two) times daily as needed. dizziness    Historical Provider, MD  meloxicam (MOBIC) 7.5 MG tablet Take 1 tablet (7.5 mg total) by mouth daily. 11/14/11 11/27/12  Luiz Blare, MD  metFORMIN (GLUCOPHAGE) 850 MG tablet Take 850 mg by mouth 2 (two) times daily with a meal.    Historical Provider, MD  metoprolol tartrate (LOPRESSOR) 25 MG tablet Take 25 mg by mouth 2 (two) times daily.    Historical Provider, MD  Naproxen Sodium (ALEVE PO) Take by mouth.    Historical Provider, MD  pravastatin (PRAVACHOL) 20 MG tablet Take 20 mg by mouth daily.    Historical Provider, MD  promethazine (PHENERGAN) 25 MG tablet Take 25 mg by mouth every 6 (six) hours as needed. For nausea/vomiting    Historical Provider, MD  tolterodine (DETROL LA) 4 MG 24 hr capsule Take 1 capsule (4 mg total) by mouth daily. 11/02/11   Thao P Le, DO  traMADol (ULTRAM) 50 MG tablet Take 1 tablet (50 mg total) by mouth every 6 (six) hours as needed for pain. 12/04/11   Elson Areas, PA    Social History:  reports that she quit smoking about 8 months ago. Her smoking use included Cigarettes. She quit after 50 years of use. She does not have any smokeless tobacco history on file. She reports that she does not drink alcohol or use illicit drugs.  Family  History  Problem Relation Age of Onset  . Heart disease Mother   . Heart disease Father   . Heart disease Sister   . Lung cancer Mother   . Lung cancer Father     Review of Systems:  Constitutional: Denies fever, chills, diaphoresis, appetite change and fatigue.  HEENT: Denies photophobia, eye pain, redness, hearing loss, ear pain, congestion, sore throat, rhinorrhea, sneezing, mouth sores, trouble swallowing, neck pain, neck stiffness and tinnitus.   Respiratory: Denies SOB, DOE, cough, chest tightness,  and wheezing.   Cardiovascular: Denies chest pain, palpitations and leg swelling.  Gastrointestinal: Denies nausea, vomiting, abdominal pain, diarrhea, constipation, blood in stool and abdominal distention.  Genitourinary: Denies dysuria, urgency, frequency, hematuria, flank pain and difficulty urinating.  Musculoskeletal: Denies myalgias, back pain, joint swelling, arthralgias and gait problem.  Skin: Denies pallor, rash and wound.  Neurological: Denies dizziness, seizures, syncope, weakness, light-headedness, numbness and headaches.  Hematological: Denies adenopathy. Easy bruising, personal or family bleeding history  Psychiatric/Behavioral: Denies suicidal ideation, mood changes, confusion, nervousness, sleep disturbance and agitation   Physical Exam: Blood pressure 133/60, pulse 71, temperature 97.5 F (36.4 C), temperature source Core (Comment), resp. rate 20, SpO2 89.00%. GEn: Drowsy, arousible, unable to assess orientation, does not answer questions HEENT: PERRLA, EOMI, scratch marks on face CVS: S1-S2 regular rate with no murmurs rubs or gallops exam  Lungs: Poor air movement bilaterally Abdomen: Soft nontender with normal bowel sounds no organomegaly Extremities no edema clubbing or cyanosis Neuro : Drowsy arousable, exam limited Moves all extremities, sensations: Unable to assess, reflexes 1+ bilaterally, plantars withdrawal Skin: Scratch marks throughout her  extremities   Labs on Admission:  Results for orders placed during the hospital encounter of 03/12/12 (from the past 48 hour(s))  GLUCOSE, CAPILLARY     Status: Abnormal   Collection Time   03/12/12 10:34 AM      Component Value Range Comment   Glucose-Capillary 123 (*) 70 - 99 mg/dL   AMMONIA     Status: Normal   Collection Time   03/12/12 10:47 AM      Component Value Range Comment   Ammonia 18  11 - 60 umol/L   COMPREHENSIVE METABOLIC PANEL     Status: Abnormal   Collection Time   03/12/12 10:47 AM      Component Value Range Comment   Sodium 141  135 - 145 mEq/L    Potassium 3.9  3.5 - 5.1 mEq/L    Chloride 106  96 - 112 mEq/L    CO2 25  19 - 32 mEq/L    Glucose, Bld 120 (*) 70 - 99 mg/dL    BUN 32 (*) 6 - 23 mg/dL    Creatinine, Ser 1.61 (*) 0.50 - 1.10 mg/dL    Calcium 9.1  8.4 - 09.6 mg/dL    Total Protein 7.0  6.0 - 8.3 g/dL    Albumin 3.1 (*) 3.5 - 5.2 g/dL    AST 13  0 - 37 U/L    ALT 11  0 - 35 U/L    Alkaline Phosphatase 78  39 - 117 U/L    Total Bilirubin 0.2 (*) 0.3 - 1.2 mg/dL    GFR calc non Af Amer 41 (*) >90 mL/min    GFR calc Af Amer 47 (*) >90 mL/min   ETHANOL     Status: Normal   Collection Time   03/12/12 10:47 AM      Component Value Range Comment   Alcohol, Ethyl (B) <11  0 - 11 mg/dL   CBC WITH DIFFERENTIAL     Status: Abnormal   Collection Time   03/12/12 10:47 AM      Component Value Range Comment   WBC 6.1  4.0 - 10.5 K/uL    RBC 4.35  3.87 - 5.11 MIL/uL    Hemoglobin 11.3 (*) 12.0 - 15.0 g/dL    HCT 04.5 (*) 40.9 - 46.0 %    MCV 78.4  78.0 - 100.0 fL    MCH  26.0  26.0 - 34.0 pg    MCHC 33.1  30.0 - 36.0 g/dL    RDW 32.4  40.1 - 02.7 %    Platelets 247  150 - 400 K/uL    Neutrophils Relative 59  43 - 77 %    Lymphocytes Relative 33  12 - 46 %    Monocytes Relative 6  3 - 12 %    Eosinophils Relative 1  0 - 5 %    Basophils Relative 1  0 - 1 %    Neutro Abs 3.5  1.7 - 7.7 K/uL    Lymphs Abs 2.0  0.7 - 4.0 K/uL    Monocytes Absolute 0.4   0.1 - 1.0 K/uL    Eosinophils Absolute 0.1  0.0 - 0.7 K/uL    Basophils Absolute 0.1  0.0 - 0.1 K/uL    RBC Morphology ELLIPTOCYTES      WBC Morphology INCREASED BANDS (>20% BANDS)   ATYPICAL LYMPHOCYTES  ACETAMINOPHEN LEVEL     Status: Normal   Collection Time   03/12/12 10:47 AM      Component Value Range Comment   Acetaminophen (Tylenol), Serum <15.0  10 - 30 ug/mL   SALICYLATE LEVEL     Status: Abnormal   Collection Time   03/12/12 10:47 AM      Component Value Range Comment   Salicylate Lvl <2.0 (*) 2.8 - 20.0 mg/dL   URINALYSIS, ROUTINE W REFLEX MICROSCOPIC     Status: Normal   Collection Time   03/12/12 10:51 AM      Component Value Range Comment   Color, Urine YELLOW  YELLOW    APPearance CLEAR  CLEAR    Specific Gravity, Urine 1.015  1.005 - 1.030    pH 5.5  5.0 - 8.0    Glucose, UA NEGATIVE  NEGATIVE mg/dL    Hgb urine dipstick NEGATIVE  NEGATIVE    Bilirubin Urine NEGATIVE  NEGATIVE    Ketones, ur NEGATIVE  NEGATIVE mg/dL    Protein, ur NEGATIVE  NEGATIVE mg/dL    Urobilinogen, UA 0.2  0.0 - 1.0 mg/dL    Nitrite NEGATIVE  NEGATIVE    Leukocytes, UA NEGATIVE  NEGATIVE MICROSCOPIC NOT DONE ON URINES WITH NEGATIVE PROTEIN, BLOOD, LEUKOCYTES, NITRITE, OR GLUCOSE <1000 mg/dL.  LACTIC ACID, PLASMA     Status: Normal   Collection Time   03/12/12 11:09 AM      Component Value Range Comment   Lactic Acid, Venous 1.0  0.5 - 2.2 mmol/L   BLOOD GAS, ARTERIAL     Status: Abnormal   Collection Time   03/12/12 11:28 AM      Component Value Range Comment   O2 Content 2.0      Delivery systems NASAL CANNULA      pH, Arterial 7.366  7.350 - 7.450    pCO2 arterial 42.2  35.0 - 45.0 mmHg    pO2, Arterial 62.8 (*) 80.0 - 100.0 mmHg    Bicarbonate 23.6  20.0 - 24.0 mEq/L    TCO2 21.7  0 - 100 mmol/L    Acid-base deficit 1.2  0.0 - 2.0 mmol/L    O2 Saturation 90.9      Patient temperature 98.6      Collection site BRACHIAL ARTERY      Drawn by [id number]      Sample type ARTERIAL DRAW        Radiological Exams on Admission: Ct Head Wo Contrast  03/12/2012  *RADIOLOGY  REPORT*  Clinical Data: Flu like symptoms with altered mental status. Diabetic.  CT HEAD WITHOUT CONTRAST  Technique:  Contiguous axial images were obtained from the base of the skull through the vertex without contrast.  Comparison: 07/31/2011 MRI.  07/05/2007 CT.  Findings: Bone windows demonstrate mucosal thickening of right ethmoid air cells.  Prior right-sided antrostomy. Clear mastoid air cells.  Soft tissue windows demonstrate no  mass lesion, hemorrhage, hydrocephalus, acute infarct, intra-axial, or extra-axial fluid collection.  IMPRESSION: 1. No acute intracranial abnormality. 2.  Sinus disease.   Original Report Authenticated By: Jeronimo Greaves, M.D.    Dg Chest Port 1 View  03/12/2012  *RADIOLOGY REPORT*  Clinical Data: Altered mental status and wheezing.  PORTABLE CHEST - 1 VIEW  Comparison: 10/05/2011  Findings: Midline trachea.  Normal heart size.  Atherosclerosis in the transverse aorta which is age advanced. No pleural effusion or pneumothorax.  There is moderate interstitial thickening, which is accentuated by the diminished lung volumes on the current exam. No lobar consolidation.  Cholecystectomy.  IMPRESSION: 1.  No acute cardiopulmonary disease. 2.  Mild peribronchial thickening which may relate to chronic bronchitis or smoking.   Original Report Authenticated By: Jeronimo Greaves, M.D.     Assessment/Plan  1. Altered mental status Suspect related to polypharmacy and recent URI Exam non focal, labs, UA, CXR,CT head unremarkable ABG-without CO2 narcosis Check influenza PCR, check urine drug screen, EtOH level, TSH Improving, supportive care, IV fluids Resume diet once more awake Cut down elavil, stop narcotics, flexeril and phenergan,  resume low dose valium PRN once awake to prevent withdrawal  2. Insulin dependent type 2 diabetes mellitus, controlled Resume Lantus at a lower dose, hold metformin,  sliding scale insulin  3. COPD: Stable, nebs when necessary  4. chronic pain: Cut down sedating medications as noted above  5. peripheral vascular disease: Status post femoropopliteal bypass, continue Plavix  6. CAD: mild non obstructive disease per cath, stable  7. Mild ARF: likely from NSAIDs, expect to improve with IVF and stopping NSAIDs  DVT prophylaxis with Lovenox  CODE STATUS full Family communication: called and  discussed with son Rene Paci telephone number [phone] Disposition inpatient  Time Spent on Admission:  [name] Pager: [phone] 03/12/2012, 4:11 PM

## 2012-03-12 NOTE — ED Notes (Signed)
Pt awake at this time, moaning but not answering questions. Effie Shy notified

## 2012-03-12 NOTE — Progress Notes (Signed)
Pt.'s son, Rochele Raring, called and inquire about pt..'s status. Mr. Manson Passey stated that pt. Had been sick for a week and he felt that her medications overwhelmed her today due to her weakened state. Mr. Manson Passey states he cannot come in due to the fact that he is feeling ill also.

## 2012-03-12 NOTE — ED Provider Notes (Signed)
History     CSN: [id number]  Arrival date & time 03/12/12  1000   First MD Initiated Contact with Patient 03/12/12 1010      Chief Complaint  Patient presents with  . Altered Mental Status    (Consider location/radiation/quality/duration/timing/severity/associated sxs/prior treatment) HPI Comments: April Branch is a 67 y.o. female transferred here by EMS reportedly for altered mental status. Time of onset was this morning. Patient cannot give history  Level V caveat: Altered mental status  The history is provided by the EMS personnel and the patient.    Past Medical History  Diagnosis Date  . Hyperlipidemia   . Arthritis   . Hypothyroidism   . GERD (gastroesophageal reflux disease)   . Anxiety   . Diabetes mellitus age 8    insulin dependent  . Peripheral vascular disease   . Lumbar disc disease   . Insulin dependent type 2 diabetes mellitus, controlled   . GERD 07/20/2006  . CAD (coronary artery disease)   . CAD (coronary artery disease) of bypass graft   . Vertigo   . History of transient ischemic attack (TIA)   . Myocardial infarction     AGE 62    . Anginal pain   . Hypertension   . Depression   . COPD 12/14/2008    PATIENT DENIES    . Stroke     3 MINI STROKES  RT SIDED WEAKNESS  . Personal history of malignant neoplasm of kidney 12/14/2008    Laparoscopic biopsy and cryoablation 7/08 Dr. Normajean Baxter   . Headache   . 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 Neuromuscular disorder     PERIPHERAL NEUROPATHY  . Anemia     Past Surgical History  Procedure Date  . Appendectomy   . Abdominal hysterectomy   . Knee surgery   . Cholecystectomy   . Lung surgery     lung nodule removed from the right side  . Laparascopic cryoablation of left kidney 08/2006    Dr. Sherre Poot for renal cell cancer  . Bladder suspension   . Shoulder surgery   . Foot surgery   . Pr vein bypass graft,aorto-fem-pop 05/27/2010  . Cardiac catheterization   .  Femoral-popliteal bypass graft 10/12/2011    Procedure: BYPASS GRAFT FEMORAL-POPLITEAL ARTERY;  Surgeon: Sherren Kerns, MD;  Location: [facility] OR;  Service: Vascular;  Laterality: Left;  Left Femoral-Popliteal Bypass Graft using 6mm x 80cm Propaten Graft with intraop arteriogram times one.  . Intraoperative arteriogram 10/12/2011    Procedure: INTRA OPERATIVE ARTERIOGRAM;  Surgeon: Sherren Kerns, MD;  Location: [facility] OR;  Service: Vascular;  Laterality: Left;    Family History  Problem Relation Age of Onset  . Heart disease Mother   . Heart disease Father   . Heart disease Sister   . Lung cancer Mother   . Lung cancer Father     History  Substance Use Topics  . Smoking status: Former Smoker -- 50 years    Types: Cigarettes    Quit date: 07/12/2011  . Smokeless tobacco: Not on file  . Alcohol Use: No    OB History    Grav Para Term Preterm Abortions TAB SAB Ect Mult Living                  Review of Systems  Unable to perform ROS   Allergies  Niacin-simvastatin er  Home Medications   Current Outpatient Rx  Name  Route  Sig  Dispense  Refill  . AMITRIPTYLINE HCL 75 MG PO TABS   Oral   Take 75 mg by mouth at bedtime.         . CLOPIDOGREL BISULFATE 75 MG PO TABS   Oral   Take 75 mg by mouth daily.           . CYCLOBENZAPRINE HCL 10 MG PO TABS   Oral   Take 10 mg by mouth every 8 (eight) hours as needed. For muscle spasms         . CYCLOBENZAPRINE HCL 5 MG PO TABS   Oral   Take 1 tablet (5 mg total) by mouth 3 (three) times daily as needed for muscle spasms.   30 tablet   0   . DIAZEPAM 2 MG PO TABS   Oral   Take 2 mg by mouth every 6 (six) hours as needed.         Marland Kitchen DICLOFENAC POTASSIUM 50 MG PO TABS   Oral   Take 1 tablet (50 mg total) by mouth 3 (three) times daily. For neck pain   30 tablet   0   . FERROUS SULFATE 325 (65 FE) MG PO TABS   Oral   Take 325 mg by mouth daily with breakfast.         . HYDROCODONE-ACETAMINOPHEN 5-325 MG PO  TABS   Oral   Take 2 tablets by mouth every 4 (four) hours as needed for pain.   10 tablet   0   . INSULIN ASPART 100 UNIT/ML [place] SOLN   Subcutaneous   Inject 4-6 Units into the skin 2 (two) times daily.          . INSULIN GLARGINE 100 UNIT/ML [place] SOLN   Subcutaneous   Inject 60 Units into the skin at bedtime.           Marland Kitchen LANSOPRAZOLE 30 MG PO CPDR   Oral   Take 30 mg by mouth daily.           Marland Kitchen LEVOTHYROXINE SODIUM 88 MCG PO TABS   Oral   Take 88 mcg by mouth daily.         Marland Kitchen MECLIZINE HCL 25 MG PO TABS   Oral   Take 25 mg by mouth 2 (two) times daily as needed. dizziness         . MELOXICAM 7.5 MG PO TABS   Oral   Take 1 tablet (7.5 mg total) by mouth daily.   14 tablet   0   . METFORMIN HCL 850 MG PO TABS   Oral   Take 850 mg by mouth 2 (two) times daily with a meal.         . METOPROLOL TARTRATE 25 MG PO TABS   Oral   Take 25 mg by mouth 2 (two) times daily.         . ALEVE PO   Oral   Take by mouth.         Marland Kitchen PRAVASTATIN SODIUM 20 MG PO TABS   Oral   Take 20 mg by mouth daily.         Marland Kitchen PROMETHAZINE HCL 25 MG PO TABS   Oral   Take 25 mg by mouth every 6 (six) hours as needed. For nausea/vomiting         . TOLTERODINE TARTRATE ER 4 MG PO CP24   Oral   Take 1 capsule (4 mg total) by mouth  daily.   30 capsule   0   . TRAMADOL HCL 50 MG PO TABS   Oral   Take 1 tablet (50 mg total) by mouth every 6 (six) hours as needed for pain.   15 tablet   0     BP 133/60  Pulse 71  Temp 97.5 F (36.4 C) (Core (Comment))  Resp 20  SpO2 91%  Physical Exam  Nursing note and vitals reviewed. Constitutional: She appears well-developed.       Appears older than stated age  HENT:  Head: Normocephalic.       Superficial scratches on face.  Eyes: Conjunctivae normal and EOM are normal. Pupils are equal, round, and reactive to light.  Neck: Normal range of motion and phonation normal. Neck supple.  Cardiovascular: Normal rate, regular  rhythm and intact distal pulses.   Pulmonary/Chest: Effort normal. No respiratory distress. She has no wheezes. She exhibits no tenderness.       Generalized rhonchi  Abdominal: Soft. She exhibits no distension. There is no tenderness. There is no guarding.  Musculoskeletal: Normal range of motion.       No large joint deformity  Neurological: She is alert. She has normal strength. She exhibits normal muscle tone.       Lethargic. Responds only briefly to ammonia by opening her eyes. Responds better to sternal rub, but falls back asleep quickly.  Skin: Skin is warm and dry.    ED Course  Procedures (including critical care time)  Emergency department treatment: Narcan given; without response  Observation for 4 hours- she's not arousable to voice, and speaks, but gives very limited history. She states that she is sleepy, but does not know why.   Review of her medicine list indicates that she is on 6 different sedating medications; these include oxycodone, lorazepam, promethazine, amitriptyline, cyclobenzaprine, and baclofen. This was all can easily be stopped except lorazepam and baclofen.   15:05- she is too sedated for discharge. He'll seek admission for observation; to insure medical stability, adjust her medicine regimen.   Case discussed with on-call hospitalist, consultant- Dr. Jomarie Longs. She will admit, the patient, to observation; to insure improvement and adjust medications.    Date: 12/11/2011  Rate: 62  Rhythm: normal sinus rhythm  QRS Axis: normal  PR and QT Intervals: normal  ST/T Wave abnormalities: normal  PR and QRS Conduction Disutrbances:none  Narrative Interpretation:   Old EKG Reviewed: unchanged   CRITICAL CARE Performed by: Mancel Bale L   Total critical care time: 30 minutes  Critical care time was exclusive of separately billable procedures and treating other patients.  Critical care was necessary to treat or prevent imminent or life-threatening  deterioration.  Critical care was time spent personally by me on the following activities: development of treatment plan with patient and/or surrogate as well as nursing, discussions with consultants, evaluation of patient's response to treatment, examination of patient, obtaining history from patient or surrogate, ordering and performing treatments and interventions, ordering and review of laboratory studies, ordering and review of radiographic studies, pulse oximetry and re-evaluation of patient's condition.   Results for orders placed during the hospital encounter of 03/12/12  AMMONIA      Component Value Range   Ammonia 18  11 - 60 umol/L  COMPREHENSIVE METABOLIC PANEL      Component Value Range   Sodium 141  135 - 145 mEq/L   Potassium 3.9  3.5 - 5.1 mEq/L   Chloride 106  96 - 112 mEq/L  CO2 25  19 - 32 mEq/L   Glucose, Bld 120 (*) 70 - 99 mg/dL   BUN 32 (*) 6 - 23 mg/dL   Creatinine, Ser 1.61 (*) 0.50 - 1.10 mg/dL   Calcium 9.1  8.4 - 09.6 mg/dL   Total Protein 7.0  6.0 - 8.3 g/dL   Albumin 3.1 (*) 3.5 - 5.2 g/dL   AST 13  0 - 37 U/L   ALT 11  0 - 35 U/L   Alkaline Phosphatase 78  39 - 117 U/L   Total Bilirubin 0.2 (*) 0.3 - 1.2 mg/dL   GFR calc non Af Amer 41 (*) >90 mL/min   GFR calc Af Amer 47 (*) >90 mL/min  ETHANOL      Component Value Range   Alcohol, Ethyl (B) <11  0 - 11 mg/dL  LACTIC ACID, PLASMA      Component Value Range   Lactic Acid, Venous 1.0  0.5 - 2.2 mmol/L  CBC WITH DIFFERENTIAL      Component Value Range   WBC 6.1  4.0 - 10.5 K/uL   RBC 4.35  3.87 - 5.11 MIL/uL   Hemoglobin 11.3 (*) 12.0 - 15.0 g/dL   HCT 04.5 (*) 40.9 - 81.1 %   MCV 78.4  78.0 - 100.0 fL   MCH 26.0  26.0 - 34.0 pg   MCHC 33.1  30.0 - 36.0 g/dL   RDW 91.4  78.2 - 95.6 %   Platelets 247  150 - 400 K/uL   Neutrophils Relative 59  43 - 77 %   Lymphocytes Relative 33  12 - 46 %   Monocytes Relative 6  3 - 12 %   Eosinophils Relative 1  0 - 5 %   Basophils Relative 1  0 - 1 %    Neutro Abs 3.5  1.7 - 7.7 K/uL   Lymphs Abs 2.0  0.7 - 4.0 K/uL   Monocytes Absolute 0.4  0.1 - 1.0 K/uL   Eosinophils Absolute 0.1  0.0 - 0.7 K/uL   Basophils Absolute 0.1  0.0 - 0.1 K/uL   RBC Morphology ELLIPTOCYTES     WBC Morphology INCREASED BANDS (>20% BANDS)    URINALYSIS, ROUTINE W REFLEX MICROSCOPIC      Component Value Range   Color, Urine YELLOW  YELLOW   APPearance CLEAR  CLEAR   Specific Gravity, Urine 1.015  1.005 - 1.030   pH 5.5  5.0 - 8.0   Glucose, UA NEGATIVE  NEGATIVE mg/dL   Hgb urine dipstick NEGATIVE  NEGATIVE   Bilirubin Urine NEGATIVE  NEGATIVE   Ketones, ur NEGATIVE  NEGATIVE mg/dL   Protein, ur NEGATIVE  NEGATIVE mg/dL   Urobilinogen, UA 0.2  0.0 - 1.0 mg/dL   Nitrite NEGATIVE  NEGATIVE   Leukocytes, UA NEGATIVE  NEGATIVE  ACETAMINOPHEN LEVEL      Component Value Range   Acetaminophen (Tylenol), Serum <15.0  10 - 30 ug/mL  SALICYLATE LEVEL      Component Value Range   Salicylate Lvl <2.0 (*) 2.8 - 20.0 mg/dL  GLUCOSE, CAPILLARY      Component Value Range   Glucose-Capillary 123 (*) 70 - 99 mg/dL  BLOOD GAS, ARTERIAL      Component Value Range   O2 Content 2.0     Delivery systems NASAL CANNULA     pH, Arterial 7.366  7.350 - 7.450   pCO2 arterial 42.2  35.0 - 45.0 mmHg   pO2, Arterial 62.8 (*)  80.0 - 100.0 mmHg   Bicarbonate 23.6  20.0 - 24.0 mEq/L   TCO2 21.7  0 - 100 mmol/L   Acid-base deficit 1.2  0.0 - 2.0 mmol/L   O2 Saturation 90.9     Patient temperature 98.6     Collection site BRACHIAL ARTERY     Drawn by [id number]     Sample type ARTERIAL DRAW       1. Lethargy         Ct Head Wo Contrast  03/12/2012  *RADIOLOGY REPORT*  Clinical Data: Flu like symptoms with altered mental status. Diabetic.  CT HEAD WITHOUT CONTRAST  Technique:  Contiguous axial images were obtained from the base of the skull through the vertex without contrast.  Comparison: 07/31/2011 MRI.  07/05/2007 CT.  Findings: Bone windows demonstrate mucosal thickening  of right ethmoid air cells.  Prior right-sided antrostomy. Clear mastoid air cells.  Soft tissue windows demonstrate no  mass lesion, hemorrhage, hydrocephalus, acute infarct, intra-axial, or extra-axial fluid collection.  IMPRESSION: 1. No acute intracranial abnormality. 2.  Sinus disease.   Original Report Authenticated By: Jeronimo Greaves, M.D.    Dg Chest Port 1 View  03/12/2012  *RADIOLOGY REPORT*  Clinical Data: Altered mental status and wheezing.  PORTABLE CHEST - 1 VIEW  Comparison: 10/05/2011  Findings: Midline trachea.  Normal heart size.  Atherosclerosis in the transverse aorta which is age advanced. No pleural effusion or pneumothorax.  There is moderate interstitial thickening, which is accentuated by the diminished lung volumes on the current exam. No lobar consolidation.  Cholecystectomy.  IMPRESSION: 1.  No acute cardiopulmonary disease. 2.  Mild peribronchial thickening which may relate to chronic bronchitis or smoking.   Original Report Authenticated By: Jeronimo Greaves, M.D.      MDM  Nonspecific sedation, with polypharmacy, no indication for acute intoxication; suspect, generalized lethargy is related to her polypharmacy, only. Doubt CVA, meningitis, encephalopathy.   Patient cannot be discharged after 4 hour TD observation.   Plan: Admit        Flint Melter, MD 03/12/12 [id number]

## 2012-03-13 DIAGNOSIS — G894 Chronic pain syndrome: Secondary | ICD-10-CM | POA: Diagnosis present

## 2012-03-13 DIAGNOSIS — N179 Acute kidney failure, unspecified: Secondary | ICD-10-CM | POA: Diagnosis present

## 2012-03-13 LAB — COMPREHENSIVE METABOLIC PANEL
ALT: 11 U/L (ref 0–35)
BUN: 19 mg/dL (ref 6–23)
CO2: 22 mEq/L (ref 19–32)
Calcium: 8.7 mg/dL (ref 8.4–10.5)
GFR calc Af Amer: 86 mL/min — ABNORMAL LOW (ref >90)
GFR calc non Af Amer: 74 mL/min — ABNORMAL LOW (ref >90)
Glucose, Bld: 106 mg/dL — ABNORMAL HIGH (ref 70–99)
Sodium: 141 mEq/L (ref 135–145)

## 2012-03-13 LAB — CBC
HCT: 32.6 % — ABNORMAL LOW (ref 36.0–46.0)
Hemoglobin: 10.6 g/dL — ABNORMAL LOW (ref 12.0–15.0)
MCH: 25.5 pg — ABNORMAL LOW (ref 26.0–34.0)
MCV: 78.6 fL (ref 78.0–100.0)
RBC: 4.15 MIL/uL (ref 3.87–5.11)

## 2012-03-13 LAB — INFLUENZA PANEL BY PCR (TYPE A & B)
H1N1 flu by pcr: NOT DETECTED
Influenza B By PCR: NEGATIVE

## 2012-03-13 LAB — T4, FREE: Free T4: 0.82 ng/dL (ref 0.80–1.80)

## 2012-03-13 LAB — URINE CULTURE

## 2012-03-13 LAB — GLUCOSE, CAPILLARY: Glucose-Capillary: 130 mg/dL — ABNORMAL HIGH (ref 70–99)

## 2012-03-13 NOTE — Progress Notes (Signed)
NCM spoke to pt and states she had [facility] in the past. Requesting [facility] for [facility]. Faxed orders and facesheet to [facility]. Pt states she has RW at home. Provided alternate # Kandis Mannan, friend # [phone]. Isidoro Donning RN CCM Case Mgmt phone [phone]

## 2012-03-13 NOTE — Discharge Summary (Signed)
Physician Discharge Summary  April Branch XBJ:[id number] DOB: [birth year] DOA: 03/12/2012  PCP: Quitman Livings, MD  Admit date: 03/12/2012 Discharge date: 03/13/2012  Recommendations for Outpatient Follow-up:  1. Home health PT evaluation and home health RN for safety check (polypharmacy, multiple abrasions to skin from puppies). 2. F/U TSH in 4 weeks.  High here, but free T4 WNL.  Discharge Diagnoses:  Principal Problem:  *Encephalopathy, toxic Active Problems:  Hypothyroidism  Insulin dependent type 2 diabetes mellitus, controlled  ANXIETY STATE NOS  COPD  CAD (coronary artery disease)  Hyperlipidemia  Peripheral vascular disease, unspecified  Chronic pain syndrome  Acute kidney injury   Discharge Condition: Improved.  Diet recommendation: Carbohydrate-modified.  History of present illness:  Mrs. April Branch is a 67 year old female with a past medical history of diabetes, peripheral vascular disease, hypertension, COPD, anxiety, and peripheral neuropathy who was brought to the hospital 03/12/2012 with decreased responsiveness, thought to be secondary to polypharmacy.  Son reported she was ill with a virus prior to her symptoms of lethargy.    Hospital Course by problem:  Principal Problem:  *Encephalopathy, toxic  Thought to be secondary to polypharmacy. Patient is on multiple sedating medications including tricyclic antidepressants, narcotics, benzodiazepines, muscle relaxers, and anticholinergics. These are on hold. Given Narcan in the ER as well. Urinalysis, chest x-ray, and CT of the head were unremarkable. ABG without evidence of CO2 narcosis.  TSH high but free T4 within normal limits, possibly reflective of sick euthyroid syndrome.  Salicylate, acetaminophen, ammonia, and ethanol levels not elevated. Urine drug screen only positive for benzodiazepines (on Valium).  Influenza studies negative. Active Problems:  Hyperlipidemia  Continue Zocor. Hypothyroidism  TSH high  but free T4 within normal limits. Recommend close followup with primary care physician. Insulin dependent type 2 diabetes mellitus, controlled  Metformin held, resume at d/c. Continue Lantus and sliding scale insulin. CBG is 118-123. ANXIETY STATE NOS  No signs of benzodiazepine withdrawal. COPD  Bronchodilators when necessary. CAD (coronary artery disease)  Mild, nonobstructive disease per cath report. Stable. Peripheral vascular disease, unspecified  Status post femoral-popliteal bypass. Continue Plavix. Chronic pain syndrome  Minimize narcotics if possible. Acute kidney injury  Thought to be secondary to NSAID use. Creatinine normalized with IV fluids and holding NSAIDs.  Procedures:  None.  Consultations:  None.  Discharge Exam: Filed Vitals:   03/13/12 0514  BP: 144/57  Pulse: 63  Temp: 98.2 F (36.8 C)  Resp: 16   Filed Vitals:   03/12/12 1721 03/12/12 1900 03/12/12 2231 03/13/12 0514  BP: 144/55  143/85 144/57  Pulse: 58  72 63  Temp: 97.9 F (36.6 C)  98 F (36.7 C) 98.2 F (36.8 C)  TempSrc: Oral  Oral Oral  Resp: 17  16 16   Height:  5\' 6"  (1.676 m)    Weight:  65.318 kg (144 lb)    SpO2: 98%  99% 99%    Gen:  NAD Cardiovascular:  RRR, No M/R/G Respiratory: Lungs CTAB Gastrointestinal: Abdomen soft, NT/ND with normal active bowel sounds. Extremities: No C/E/C   Discharge Instructions  Discharge Orders    Future Appointments: Provider: Department: Dept Phone: Center:   04/07/2012 3:00 PM [facility] [address] [phone] [facility]   04/07/2012 3:30 PM [facility] [phone] [facility]   04/07/2012 4:00 PM Evern Bio, NP [facility] [phone] [facility]     Future Orders Please Complete By Expires   Diet Carb Modified  Increase activity slowly      Dan Humphreys       Call MD for:  temperature >100.4      Call MD for:  severe uncontrolled pain       Call MD for:  persistant dizziness or light-headedness      Call MD for:  extreme fatigue      Home Health      Scheduling Instructions:   RN to perform home safety evaluation.  ? Polypharmacy, ? Unsafe situation with puppies scratching her skin.   Questions: Responses:   To provide the following care/treatments PT    RN   Face-to-face encounter      Comments:   I [name] certify that this patient is under my care and that I, or a nurse practitioner or physician's assistant working with me, had a face-to-face encounter that meets the physician face-to-face encounter requirements with this patient on 03/13/2012. The encounter with the patient was in whole, or in part for the following medical condition(s) which is the primary reason for home health care (List medical condition): Altered mental status.  Needs RN to perform home safety evaluation.  Multiple scratches from puppies (high risk for infection given DM); PT to evaluate and treat deconditioning.   Questions: Responses:   The encounter with the patient was in whole, or in part, for the following medical condition, which is the primary reason for home health care Altered mental status, deconditioning   I certify that, based on my findings, the following services are medically necessary home health services Nursing    Physical therapy   My clinical findings support the need for the above services Unable to leave home safely without assistance and/or assistive device   Further, I certify that my clinical findings support that this patient is homebound due to: Unable to leave home safely without assistance   To provide the following care/treatments PT    RN       Medication List     As of 03/13/2012  1:14 PM    STOP taking these medications         cyclobenzaprine 10 MG tablet   Commonly known as: FLEXERIL      LORazepam 1 MG tablet   Commonly known as: ATIVAN      meclizine 25 MG tablet   Commonly known as: ANTIVERT        TAKE these medications         amitriptyline 75 MG tablet   Commonly known as: ELAVIL   Take 75 mg by mouth at bedtime.      clopidogrel 75 MG tablet   Commonly known as: PLAVIX   Take 75 mg by mouth every morning.      insulin aspart 100 UNIT/ML injection   Commonly known as: novoLOG   Inject 4-6 Units into the skin 2 (two) times daily.      insulin glargine 100 UNIT/ML injection   Commonly known as: LANTUS   Inject 60 Units into the skin at bedtime.      lansoprazole 30 MG capsule   Commonly known as: PREVACID   Take 30 mg by mouth every morning.      levothyroxine 88 MCG tablet   Commonly known as: SYNTHROID, LEVOTHROID   Take 88 mcg by mouth daily before breakfast.      metFORMIN 850 MG tablet   Commonly known as: GLUCOPHAGE   Take 850 mg by mouth 2 (two) times daily with a meal.  metoprolol tartrate 25 MG tablet   Commonly known as: LOPRESSOR   Take 25 mg by mouth 2 (two) times daily.      oxyCODONE-acetaminophen 5-325 MG per tablet   Commonly known as: PERCOCET/ROXICET   Take 1 tablet by mouth 4 (four) times daily as needed. For pain      pravastatin 20 MG tablet   Commonly known as: PRAVACHOL   Take 20 mg by mouth daily.      tolterodine 4 MG 24 hr capsule   Commonly known as: DETROL LA   Take 4 mg by mouth every morning.           Follow-up Information    Follow up with [facility], MD. Schedule an appointment as soon as possible for a visit in 1 week.   Contact information:   [address] [place] [address] [phone]           The results of significant diagnostics from this hospitalization (including imaging, microbiology, ancillary and laboratory) are listed below for reference.    Significant Diagnostic Studies: Ct Head Wo Contrast  03/12/2012  *RADIOLOGY REPORT*  Clinical Data: Flu like symptoms with altered mental status. Diabetic.  CT HEAD WITHOUT CONTRAST  Technique:  Contiguous axial images were obtained  from the base of the skull through the vertex without contrast.  Comparison: 07/31/2011 MRI.  07/05/2007 CT.  Findings: Bone windows demonstrate mucosal thickening of right ethmoid air cells.  Prior right-sided antrostomy. Clear mastoid air cells.  Soft tissue windows demonstrate no  mass lesion, hemorrhage, hydrocephalus, acute infarct, intra-axial, or extra-axial fluid collection.  IMPRESSION: 1. No acute intracranial abnormality. 2.  Sinus disease.   Original Report Authenticated By: Jeronimo Greaves, M.D.    Dg Chest Port 1 View  03/12/2012  *RADIOLOGY REPORT*  Clinical Data: Altered mental status and wheezing.  PORTABLE CHEST - 1 VIEW  Comparison: 10/05/2011  Findings: Midline trachea.  Normal heart size.  Atherosclerosis in the transverse aorta which is age advanced. No pleural effusion or pneumothorax.  There is moderate interstitial thickening, which is accentuated by the diminished lung volumes on the current exam. No lobar consolidation.  Cholecystectomy.  IMPRESSION: 1.  No acute cardiopulmonary disease. 2.  Mild peribronchial thickening which may relate to chronic bronchitis or smoking.   Original Report Authenticated By: Jeronimo Greaves, M.D.     Microbiology: Recent Results (from the past 240 hour(s))  MRSA PCR SCREENING     Status: Normal   Collection Time   03/12/12  5:34 PM      Component Value Range Status Comment   MRSA by PCR NEGATIVE  NEGATIVE Final      Labs: Basic Metabolic Panel:  Lab 03/13/12 9528 03/12/12 1047  NA 141 141  K 4.0 3.9  CL 107 106  CO2 22 25  GLUCOSE 106* 120*  BUN 19 32*  CREATININE 0.82 1.35*  CALCIUM 8.7 9.1  MG -- --  PHOS -- --   Liver Function Tests:  Lab 03/13/12 0412 03/12/12 1047  AST 14 13  ALT 11 11  ALKPHOS 75 78  BILITOT 0.2* 0.2*  PROT 6.7 7.0  ALBUMIN 2.9* 3.1*    Lab 03/12/12 1047  AMMONIA 18   CBC:  Lab 03/13/12 0412 03/12/12 1047  WBC 6.8 6.1  NEUTROABS -- 3.5  HGB 10.6* 11.3*  HCT 32.6* 34.1*  MCV 78.6 78.4  PLT 282  247   CBG:  Lab 03/13/12 1208 03/13/12 0745 03/12/12 2236 03/12/12 1827  03/12/12 1034  GLUCAP 130* 97 118* 118* 123*    Time coordinating discharge: 35 minutes.  Signed:  Shelbi Vaccaro  Pager [phone] [name] 03/13/2012, 1:14 PM

## 2012-03-13 NOTE — Progress Notes (Addendum)
[name] PROGRESS NOTE  April Branch ZOX:[id number] DOB: April Branch 29, 1947 DOA: 03/12/2012 PCP: Quitman Livings, MD  Brief narrative: April Branch is a 67 year old female with a past medical history of diabetes, peripheral vascular disease, hypertension, COPD, anxiety, and peripheral neuropathy who was brought to the hospital 03/12/2012 with decreased responsiveness, thought to be secondary to polypharmacy.  Assessment/Plan: Principal Problem:  *Encephalopathy, toxic  Thought to be secondary to polypharmacy. Patient is on multiple sedating medications including tricyclic antidepressants, narcotics, benzodiazepines, muscle relaxers, and anticholinergics. These are on hold.  Urinalysis, chest x-ray, and CT of the head were unremarkable. ABG without evidence of CO2 narcosis.  TSH high but free T4 within normal limits, possibly reflective of sick euthyroid syndrome.  Salicylate, acetaminophen, ammonia, and ethanol levels not elevated. Urine drug screen only positive for benzodiazepines (on Valium).  Influenza studies negative. Active Problems:  Hyperlipidemia  Continue Zocor.  Hypothyroidism  TSH high but free T4 within normal limits. Recommend close followup with primary care physician.  Insulin dependent type 2 diabetes mellitus, controlled  Metformin on hold. Continue Lantus and sliding scale insulin. CBG is 118-123.  ANXIETY STATE NOS  Watch for signs of benzodiazepine withdrawal.  COPD  Bronchodilators when necessary.  CAD (coronary artery disease)  Mild, nonobstructive disease per cath report. Stable.  Peripheral vascular disease, unspecified  Status post femoral-popliteal bypass. Continue Plavix.  Chronic pain syndrome  Minimize narcotics if possible.  Acute kidney injury  Thought to be secondary to NSAID use. Creatinine normalized with IV fluids and holding NSAIDs.  Code Status: Full. Family Communication: April Branch [phone]). Disposition Plan: Home  when stable.   Medical Consultants:  None.  Other Consultants:  None.  Anti-infectives:  None.  HPI/Subjective: April Branch is awake, alert, and without complaints this morning. She does not recall the reason for her hospital admission. When confronted about taking multiple sedating medications, the patient denied this.  Objective: Filed Vitals:   03/12/12 1721 03/12/12 1900 03/12/12 2231 03/13/12 0514  BP: 144/55  143/85 144/57  Pulse: 58  72 63  Temp: 97.9 F (36.6 C)  98 F (36.7 C) 98.2 F (36.8 C)  TempSrc: Oral  Oral Oral  Resp: 17  16 16   Height:  5\' 6"  (1.676 m)    Weight:  65.318 kg (144 lb)    SpO2: 98%  99% 99%    Intake/Output Summary (Last 24 hours) at 03/13/12 0755 Last data filed at 03/13/12 0600  Gross per 24 hour  Intake   1200 ml  Output    675 ml  Net    525 ml    Exam: Gen:  NAD, alert and awake Cardiovascular:  RRR, No M/R/G Respiratory:  Lungs CTAB Gastrointestinal:  Abdomen soft, NT/ND, + BS Extremities:  No C/E/C, multiple scratches but no evidence of soft tissue infection  Data Reviewed: Basic Metabolic Panel:  Lab 03/13/12 8295 03/12/12 1047  NA 141 141  K 4.0 3.9  CL 107 106  CO2 22 25  GLUCOSE 106* 120*  BUN 19 32*  CREATININE 0.82 1.35*  CALCIUM 8.7 9.1  MG -- --  PHOS -- --   GFR Estimated Creatinine Clearance: 64.9 ml/min (by C-G formula based on Cr of 0.82). Liver Function Tests:  Lab 03/13/12 0412 03/12/12 1047  AST 14 13  ALT 11 11  ALKPHOS 75 78  BILITOT 0.2* 0.2*  PROT 6.7 7.0  ALBUMIN 2.9* 3.1*    Lab 03/12/12 1047  AMMONIA 18    CBC:  Lab 03/13/12  4540 03/12/12 1047  WBC 6.8 6.1  NEUTROABS -- 3.5  HGB 10.6* 11.3*  HCT 32.6* 34.1*  MCV 78.6 78.4  PLT 282 247   BNP (last 3 results)  Basename 05/09/11 2338  PROBNP 39.2   CBG:  Lab 03/12/12 2236 03/12/12 1827 03/12/12 1034  GLUCAP 118* 118* 123*   Thyroid function studies  Basename 03/12/12 1047  TSH 22.726*  T4TOTAL --    T3FREE --  THYROIDAB --   Microbiology Recent Results (from the past 240 hour(s))  MRSA PCR SCREENING     Status: Normal   Collection Time   03/12/12  5:34 PM      Component Value Range Status Comment   MRSA by PCR NEGATIVE  NEGATIVE Final      Procedures and Diagnostic Studies: Ct Head Wo Contrast  03/12/2012  *RADIOLOGY REPORT*  Clinical Data: Flu like symptoms with altered mental status. Diabetic.  CT HEAD WITHOUT CONTRAST  Technique:  Contiguous axial images were obtained from the base of the skull through the vertex without contrast.  Comparison: 07/31/2011 MRI.  07/05/2007 CT.  Findings: Bone windows demonstrate mucosal thickening of right ethmoid air cells.  Prior right-sided antrostomy. Clear mastoid air cells.  Soft tissue windows demonstrate no  mass lesion, hemorrhage, hydrocephalus, acute infarct, intra-axial, or extra-axial fluid collection.  IMPRESSION: 1. No acute intracranial abnormality. 2.  Sinus disease.   Original Report Authenticated By: Jeronimo Greaves, M.D.    Dg Chest Port 1 View  03/12/2012  *RADIOLOGY REPORT*  Clinical Data: Altered mental status and wheezing.  PORTABLE CHEST - 1 VIEW  Comparison: 10/05/2011  Findings: Midline trachea.  Normal heart size.  Atherosclerosis in the transverse aorta which is age advanced. No pleural effusion or pneumothorax.  There is moderate interstitial thickening, which is accentuated by the diminished lung volumes on the current exam. No lobar consolidation.  Cholecystectomy.  IMPRESSION: 1.  No acute cardiopulmonary disease. 2.  Mild peribronchial thickening which may relate to chronic bronchitis or smoking.   Original Report Authenticated By: Jeronimo Greaves, M.D.     Scheduled Meds:    . amitriptyline  25 mg Oral QHS  . clopidogrel  75 mg Oral Daily  . enoxaparin (LOVENOX) injection  40 mg Subcutaneous Q24H  . insulin aspart  0-9 Units Subcutaneous TID WC  . insulin glargine  50 Units Subcutaneous QHS  . levothyroxine  88 mcg Oral  QAC breakfast  . metoprolol tartrate  25 mg Oral BID  . pantoprazole  40 mg Oral Daily  . simvastatin  5 mg Oral q1800   Continuous Infusions:    . sodium chloride 1,000 mL (03/12/12 1600)    Time spent: 25 minutes.   LOS: 1 day   RAMA,CHRISTINA  [name] Pager [phone].  If 8PM-8AM, please contact night-coverage at [email or web address], password [place] 03/13/2012, 7:55 AM

## 2012-03-15 ENCOUNTER — Ambulatory Visit (HOSPITAL_COMMUNITY): Payer: Medicare HMO

## 2012-03-15 ENCOUNTER — Inpatient Hospital Stay (HOSPITAL_COMMUNITY)
Admission: EM | Admit: 2012-03-15 | Discharge: 2012-03-18 | DRG: 092 | Disposition: A | Discharge status: Home Health | Payer: Medicare HMO | Attending: Internal Medicine | Admitting: Internal Medicine

## 2012-03-15 ENCOUNTER — Encounter (HOSPITAL_COMMUNITY): Payer: Self-pay | Admitting: *Deleted

## 2012-03-15 ENCOUNTER — Emergency Department (HOSPITAL_COMMUNITY): Payer: Medicare HMO

## 2012-03-15 ENCOUNTER — Observation Stay (HOSPITAL_COMMUNITY): Payer: Medicare HMO

## 2012-03-15 DIAGNOSIS — M79609 Pain in unspecified limb: Secondary | ICD-10-CM

## 2012-03-15 DIAGNOSIS — E785 Hyperlipidemia, unspecified: Secondary | ICD-10-CM | POA: Diagnosis present

## 2012-03-15 DIAGNOSIS — I219 Acute myocardial infarction, unspecified: Secondary | ICD-10-CM

## 2012-03-15 DIAGNOSIS — G929 Unspecified toxic encephalopathy: Principal | ICD-10-CM | POA: Diagnosis present

## 2012-03-15 DIAGNOSIS — I10 Essential (primary) hypertension: Secondary | ICD-10-CM | POA: Diagnosis present

## 2012-03-15 DIAGNOSIS — Z6826 Body mass index (BMI) 26.0-26.9, adult: Secondary | ICD-10-CM

## 2012-03-15 DIAGNOSIS — F411 Generalized anxiety disorder: Secondary | ICD-10-CM | POA: Diagnosis present

## 2012-03-15 DIAGNOSIS — R4182 Altered mental status, unspecified: Secondary | ICD-10-CM

## 2012-03-15 DIAGNOSIS — E46 Unspecified protein-calorie malnutrition: Secondary | ICD-10-CM | POA: Diagnosis present

## 2012-03-15 DIAGNOSIS — E1149 Type 2 diabetes mellitus with other diabetic neurological complication: Secondary | ICD-10-CM | POA: Diagnosis present

## 2012-03-15 DIAGNOSIS — Z85528 Personal history of other malignant neoplasm of kidney: Secondary | ICD-10-CM

## 2012-03-15 DIAGNOSIS — F17200 Nicotine dependence, unspecified, uncomplicated: Secondary | ICD-10-CM | POA: Diagnosis present

## 2012-03-15 DIAGNOSIS — Z86010 Personal history of colonic polyps: Secondary | ICD-10-CM

## 2012-03-15 DIAGNOSIS — I739 Peripheral vascular disease, unspecified: Secondary | ICD-10-CM | POA: Diagnosis present

## 2012-03-15 DIAGNOSIS — I252 Old myocardial infarction: Secondary | ICD-10-CM

## 2012-03-15 DIAGNOSIS — Z86718 Personal history of other venous thrombosis and embolism: Secondary | ICD-10-CM

## 2012-03-15 DIAGNOSIS — I200 Unstable angina: Secondary | ICD-10-CM

## 2012-03-15 DIAGNOSIS — I70219 Atherosclerosis of native arteries of extremities with intermittent claudication, unspecified extremity: Secondary | ICD-10-CM

## 2012-03-15 DIAGNOSIS — K589 Irritable bowel syndrome without diarrhea: Secondary | ICD-10-CM

## 2012-03-15 DIAGNOSIS — G894 Chronic pain syndrome: Secondary | ICD-10-CM | POA: Diagnosis present

## 2012-03-15 DIAGNOSIS — F329 Major depressive disorder, single episode, unspecified: Secondary | ICD-10-CM | POA: Diagnosis present

## 2012-03-15 DIAGNOSIS — E1142 Type 2 diabetes mellitus with diabetic polyneuropathy: Secondary | ICD-10-CM | POA: Diagnosis present

## 2012-03-15 DIAGNOSIS — C539 Malignant neoplasm of cervix uteri, unspecified: Secondary | ICD-10-CM

## 2012-03-15 DIAGNOSIS — K219 Gastro-esophageal reflux disease without esophagitis: Secondary | ICD-10-CM

## 2012-03-15 DIAGNOSIS — Z8673 Personal history of transient ischemic attack (TIA), and cerebral infarction without residual deficits: Secondary | ICD-10-CM

## 2012-03-15 DIAGNOSIS — N179 Acute kidney failure, unspecified: Secondary | ICD-10-CM

## 2012-03-15 DIAGNOSIS — J4489 Other specified chronic obstructive pulmonary disease: Secondary | ICD-10-CM | POA: Diagnosis present

## 2012-03-15 DIAGNOSIS — E039 Hypothyroidism, unspecified: Secondary | ICD-10-CM | POA: Diagnosis present

## 2012-03-15 DIAGNOSIS — J449 Chronic obstructive pulmonary disease, unspecified: Secondary | ICD-10-CM

## 2012-03-15 DIAGNOSIS — Z794 Long term (current) use of insulin: Secondary | ICD-10-CM

## 2012-03-15 DIAGNOSIS — M5106 Intervertebral disc disorders with myelopathy, lumbar region: Secondary | ICD-10-CM

## 2012-03-15 DIAGNOSIS — I2510 Atherosclerotic heart disease of native coronary artery without angina pectoris: Secondary | ICD-10-CM | POA: Diagnosis present

## 2012-03-15 DIAGNOSIS — F3289 Other specified depressive episodes: Secondary | ICD-10-CM | POA: Diagnosis present

## 2012-03-15 DIAGNOSIS — Z79899 Other long term (current) drug therapy: Secondary | ICD-10-CM

## 2012-03-15 DIAGNOSIS — G92 Toxic encephalopathy: Secondary | ICD-10-CM

## 2012-03-15 LAB — GLUCOSE, CAPILLARY: Glucose-Capillary: 123 mg/dL — ABNORMAL HIGH (ref 70–99)

## 2012-03-15 LAB — COMPREHENSIVE METABOLIC PANEL
ALT: 15 U/L (ref 0–35)
Albumin: 3.6 g/dL (ref 3.5–5.2)
Alkaline Phosphatase: 99 U/L (ref 39–117)
Chloride: 100 mEq/L (ref 96–112)
GFR calc Af Amer: 72 mL/min — ABNORMAL LOW (ref >90)
Glucose, Bld: 116 mg/dL — ABNORMAL HIGH (ref 70–99)
Potassium: 3.9 mEq/L (ref 3.5–5.1)
Sodium: 137 mEq/L (ref 135–145)
Total Bilirubin: 0.3 mg/dL (ref 0.3–1.2)
Total Protein: 8.1 g/dL (ref 6.0–8.3)

## 2012-03-15 LAB — URINE MICROSCOPIC-ADD ON

## 2012-03-15 LAB — CBC
HCT: 35.9 % — ABNORMAL LOW (ref 36.0–46.0)
Hemoglobin: 12 g/dL (ref 12.0–15.0)
MCHC: 33.4 g/dL (ref 30.0–36.0)
RBC: 4.68 MIL/uL (ref 3.87–5.11)
WBC: 10.2 10*3/uL (ref 4.0–10.5)

## 2012-03-15 LAB — URINALYSIS, ROUTINE W REFLEX MICROSCOPIC
Glucose, UA: 250 mg/dL — AB
Protein, ur: 100 mg/dL — AB
pH: 5.5 (ref 5.0–8.0)

## 2012-03-15 LAB — RAPID URINE DRUG SCREEN, HOSP PERFORMED
Amphetamines: NOT DETECTED
Barbiturates: NOT DETECTED
Benzodiazepines: NOT DETECTED
Cocaine: NOT DETECTED

## 2012-03-15 MED ORDER — METFORMIN HCL 850 MG PO TABS
850.0000 mg | ORAL_TABLET | Freq: Two times a day (BID) | ORAL | Status: DC
  Administered 2012-03-16 – 2012-03-18 (×5): 850 mg via ORAL
  Filled 2012-03-15 (×8): qty 1

## 2012-03-15 MED ORDER — SODIUM CHLORIDE 0.9 % IV BOLUS (SEPSIS)
250.0000 mL | Freq: Once | INTRAVENOUS | Status: AC
  Administered 2012-03-15: 14:00:00 via INTRAVENOUS

## 2012-03-15 MED ORDER — ONDANSETRON HCL 4 MG/2ML IJ SOLN: 4.0000 mg | Freq: Four times a day (QID) | INTRAMUSCULAR | Status: DC | PRN

## 2012-03-15 MED ORDER — SODIUM CHLORIDE 0.9 % IV SOLN
INTRAVENOUS | Status: DC
  Administered 2012-03-15 – 2012-03-16 (×2): 1000 mL via INTRAVENOUS
  Administered 2012-03-16: 18:00:00 via INTRAVENOUS
  Administered 2012-03-16: 1000 mL via INTRAVENOUS
  Administered 2012-03-17 – 2012-03-18 (×2): via INTRAVENOUS

## 2012-03-15 MED ORDER — GADOBENATE DIMEGLUMINE 529 MG/ML IV SOLN
13.0000 mL | Freq: Once | INTRAVENOUS | Status: AC | PRN
  Administered 2012-03-15: 13 mL via INTRAVENOUS

## 2012-03-15 MED ORDER — CLOPIDOGREL BISULFATE 75 MG PO TABS
75.0000 mg | ORAL_TABLET | Freq: Every morning | ORAL | Status: DC
  Administered 2012-03-16 – 2012-03-18 (×3): 75 mg via ORAL
  Filled 2012-03-15 (×3): qty 1

## 2012-03-15 MED ORDER — LEVOTHYROXINE SODIUM 88 MCG PO TABS
88.0000 ug | ORAL_TABLET | Freq: Every day | ORAL | Status: DC
  Administered 2012-03-16 – 2012-03-18 (×3): 88 ug via ORAL
  Filled 2012-03-15 (×5): qty 1

## 2012-03-15 MED ORDER — ACETAMINOPHEN 325 MG PO TABS
650.0000 mg | ORAL_TABLET | Freq: Four times a day (QID) | ORAL | Status: DC | PRN
  Administered 2012-03-17 – 2012-03-18 (×3): 650 mg via ORAL
  Filled 2012-03-15 (×3): qty 2

## 2012-03-15 MED ORDER — INSULIN ASPART 100 UNIT/ML ~~LOC~~ SOLN: 0.0000 [IU] | Freq: Every day | SUBCUTANEOUS | Status: DC

## 2012-03-15 MED ORDER — NALOXONE HCL 0.4 MG/ML IJ SOLN
0.4000 mg | INTRAMUSCULAR | Status: DC | PRN
  Administered 2012-03-15: 0.4 mg via INTRAVENOUS
  Filled 2012-03-15: qty 1

## 2012-03-15 MED ORDER — SODIUM CHLORIDE 0.9 % IV SOLN
INTRAVENOUS | Status: DC
  Administered 2012-03-15: 16:00:00 via INTRAVENOUS

## 2012-03-15 MED ORDER — ONDANSETRON HCL 4 MG PO TABS: 4.0000 mg | ORAL_TABLET | Freq: Four times a day (QID) | ORAL | Status: DC | PRN

## 2012-03-15 MED ORDER — FESOTERODINE FUMARATE ER 8 MG PO TB24
8.0000 mg | ORAL_TABLET | Freq: Every day | ORAL | Status: DC
  Administered 2012-03-15 – 2012-03-18 (×4): 8 mg via ORAL
  Filled 2012-03-15 (×4): qty 1

## 2012-03-15 MED ORDER — SIMVASTATIN 10 MG PO TABS
10.0000 mg | ORAL_TABLET | Freq: Every day | ORAL | Status: DC
  Administered 2012-03-15 – 2012-03-17 (×3): 10 mg via ORAL
  Filled 2012-03-15 (×4): qty 1

## 2012-03-15 MED ORDER — ALUM & MAG HYDROXIDE-SIMETH 200-200-20 MG/5ML PO SUSP: 30.0000 mL | Freq: Four times a day (QID) | ORAL | Status: DC | PRN

## 2012-03-15 MED ORDER — ENOXAPARIN SODIUM 40 MG/0.4ML ~~LOC~~ SOLN
40.0000 mg | SUBCUTANEOUS | Status: DC
  Administered 2012-03-15 – 2012-03-17 (×3): 40 mg via SUBCUTANEOUS
  Filled 2012-03-15 (×4): qty 0.4

## 2012-03-15 MED ORDER — PANTOPRAZOLE SODIUM 20 MG PO TBEC
20.0000 mg | DELAYED_RELEASE_TABLET | Freq: Every day | ORAL | Status: DC
  Administered 2012-03-16 – 2012-03-18 (×3): 20 mg via ORAL
  Filled 2012-03-15 (×4): qty 1

## 2012-03-15 MED ORDER — SODIUM CHLORIDE 0.9 % IV SOLN: INTRAVENOUS | Status: DC

## 2012-03-15 MED ORDER — METOPROLOL TARTRATE 25 MG PO TABS
25.0000 mg | ORAL_TABLET | Freq: Two times a day (BID) | ORAL | Status: DC
  Administered 2012-03-15 – 2012-03-18 (×6): 25 mg via ORAL
  Filled 2012-03-15 (×8): qty 1

## 2012-03-15 MED ORDER — INSULIN ASPART 100 UNIT/ML ~~LOC~~ SOLN
0.0000 [IU] | Freq: Three times a day (TID) | SUBCUTANEOUS | Status: DC
  Administered 2012-03-16 – 2012-03-18 (×4): 2 [IU] via SUBCUTANEOUS

## 2012-03-15 MED ORDER — ACETAMINOPHEN 650 MG RE SUPP: 650.0000 mg | Freq: Four times a day (QID) | RECTAL | Status: DC | PRN

## 2012-03-15 MED ORDER — SODIUM CHLORIDE 0.9 % IV SOLN
INTRAVENOUS | Status: AC
  Administered 2012-03-15: 16:00:00 via INTRAVENOUS

## 2012-03-15 NOTE — ED Provider Notes (Signed)
History     CSN: [id number]  Arrival date & time 03/15/12  1023   First MD Initiated Contact with Patient 03/15/12 1221      Chief Complaint  Patient presents with  . Altered Mental Status    (Consider location/radiation/quality/duration/timing/severity/associated sxs/prior treatment) Patient is a 67 y.o. female presenting with altered mental status. The history is provided by a friend. The history is limited by the condition of the patient.  Altered Mental Status This is a new problem.   according to a friend and neighbor patient was her usual self until Friday. Then became very confused and not able to care for herself. She was brought into the hospital she was admitted on Saturday and discharged on Sunday without any specific findings in the workup however the neighbor states that patient is not able to eat not able to care for herself at home she does live with her son but they don't do anything to help her out. Patient will most likely require a [facility] for long-term placement. No worsening in symptoms just not any better.  Past Medical History  Diagnosis Date  . Hyperlipidemia   . Arthritis   . Hypothyroidism   . GERD (gastroesophageal reflux disease)   . Anxiety   . Diabetes mellitus age 61    insulin dependent  . Peripheral vascular disease   . Lumbar disc disease   . Insulin dependent type 2 diabetes mellitus, controlled   . GERD 07/20/2006  . CAD (coronary artery disease)   . CAD (coronary artery disease) of bypass graft   . Vertigo   . History of transient ischemic attack (TIA)   . Myocardial infarction     AGE 4    . Anginal pain   . Hypertension   . Depression   . COPD 12/14/2008    PATIENT DENIES    . Stroke     3 MINI STROKES  RT SIDED WEAKNESS  . Personal history of malignant neoplasm of kidney 12/14/2008    Laparoscopic biopsy and cryoablation 7/08 Dr. Normajean Baxter   . Headache   . Neuromuscular disorder     PERIPHERAL NEUROPATHY  . Anemia   . Cancer       CANCER OF KIDNEY  DR Olivia Canter     Past Surgical History  Procedure Date  . Appendectomy   . Abdominal hysterectomy   . Knee surgery   . Cholecystectomy   . Lung surgery     lung nodule removed from the right side  . Laparascopic cryoablation of left kidney 08/2006    Dr. Sherre Poot for renal cell cancer  . Bladder suspension   . Shoulder surgery   . Foot surgery   . Pr vein bypass graft,aorto-fem-pop 05/27/2010  . Cardiac catheterization   . Femoral-popliteal bypass graft 10/12/2011    Procedure: BYPASS GRAFT FEMORAL-POPLITEAL ARTERY;  Surgeon: Sherren Kerns, MD;  Location: [facility] OR;  Service: Vascular;  Laterality: Left;  Left Femoral-Popliteal Bypass Graft using 6mm x 80cm Propaten Graft with intraop arteriogram times one.  . Intraoperative arteriogram 10/12/2011    Procedure: INTRA OPERATIVE ARTERIOGRAM;  Surgeon: Sherren Kerns, MD;  Location: [facility] OR;  Service: Vascular;  Laterality: Left;    Family History  Problem Relation Age of Onset  . Heart disease Mother   . Heart disease Father   . Heart disease Sister   . Lung cancer Mother   . Lung cancer Father     History  Substance Use Topics  . Smoking status:  Former Smoker -- 50 years    Types: Cigarettes    Quit date: 07/12/2011  . Smokeless tobacco: Not on file  . Alcohol Use: No    OB History    Grav Para Term Preterm Abortions TAB SAB Ect Mult Living                  Review of Systems  Unable to perform ROS Psychiatric/Behavioral: Positive for altered mental status.    Allergies  Niacin-simvastatin er  Home Medications   Current Outpatient Rx  Name  Route  Sig  Dispense  Refill  . AMITRIPTYLINE HCL 75 MG PO TABS   Oral   Take 75 mg by mouth at bedtime.         . CLOPIDOGREL BISULFATE 75 MG PO TABS   Oral   Take 75 mg by mouth every morning.         . INSULIN ASPART 100 UNIT/ML [place] SOLN   Subcutaneous   Inject 4-6 Units into the skin 2 (two) times daily.          . INSULIN GLARGINE 100  UNIT/ML [place] SOLN   Subcutaneous   Inject 60 Units into the skin at bedtime.           Marland Kitchen LANSOPRAZOLE 30 MG PO CPDR   Oral   Take 30 mg by mouth every morning.         Marland Kitchen LEVOTHYROXINE SODIUM 88 MCG PO TABS   Oral   Take 88 mcg by mouth daily before breakfast.         . METFORMIN HCL 850 MG PO TABS   Oral   Take 850 mg by mouth 2 (two) times daily with a meal.         . METOPROLOL TARTRATE 25 MG PO TABS   Oral   Take 25 mg by mouth 2 (two) times daily.         . OXYCODONE-ACETAMINOPHEN 5-325 MG PO TABS   Oral   Take 1 tablet by mouth 4 (four) times daily as needed. For pain         . PRAVASTATIN SODIUM 20 MG PO TABS   Oral   Take 20 mg by mouth daily.         . TOLTERODINE TARTRATE ER 4 MG PO CP24   Oral   Take 4 mg by mouth every morning.           BP 152/81  Pulse 87  Temp 98 F (36.7 C) (Oral)  Resp 18  SpO2 99%  Physical Exam  Nursing note and vitals reviewed. Constitutional: She appears well-developed.  HENT:  Head: Normocephalic and atraumatic.       Mucous membranes are dry.  Eyes: Conjunctivae normal and EOM are normal. Pupils are equal, round, and reactive to light.  Neck: Normal range of motion.  Cardiovascular: Normal rate, regular rhythm and normal heart sounds.   No murmur heard. Pulmonary/Chest: Effort normal and breath sounds normal. No respiratory distress.  Abdominal: Soft. Bowel sounds are normal. There is no tenderness.  Musculoskeletal: Normal range of motion. She exhibits no edema.  Neurological:       Patient mostly staring. However will follow some commands. Not verbal. He is moving all 4 extremities.  Skin: Skin is warm. No rash noted.    ED Course  Procedures (including critical care time)  Labs Reviewed  CBC - Abnormal; Notable for the following:    HCT 35.9 (*)  MCV 76.7 (*)     MCH 25.6 (*)     All other components within normal limits  COMPREHENSIVE METABOLIC PANEL - Abnormal; Notable for the following:      Glucose, Bld 116 (*)     GFR calc non Af Amer 62 (*)     GFR calc Af Amer 72 (*)     All other components within normal limits  URINALYSIS, ROUTINE W REFLEX MICROSCOPIC - Abnormal; Notable for the following:    Color, Urine AMBER (*)  BIOCHEMICALS MAY BE AFFECTED BY COLOR   APPearance CLOUDY (*)     Glucose, UA 250 (*)     Bilirubin Urine MODERATE (*)     Ketones, ur TRACE (*)     Protein, ur 100 (*)     Leukocytes, UA SMALL (*)     All other components within normal limits  URINE MICROSCOPIC-ADD ON - Abnormal; Notable for the following:    Squamous Epithelial / LPF FEW (*)     Bacteria, UA FEW (*)     All other components within normal limits  URINE CULTURE   Ct Head Wo Contrast  03/15/2012  *RADIOLOGY REPORT*  Clinical Data: Altered mental status.  Confusion.  Headache.  CT HEAD WITHOUT CONTRAST  Technique:  Contiguous axial images were obtained from the base of the skull through the vertex without contrast.  Comparison: 03/12/2012  Findings: The brain stem, cerebellum, cerebral peduncles, thalami, basal ganglia, basilar cisterns, and ventricular system appear unremarkable.  No intracranial hemorrhage, mass lesion, or acute infarction is identified.  Prior right maxillary sinus surgery noted.  There is atherosclerotic calcification of the carotid siphons.  IMPRESSION:  1.  No acute intracranial findings.  Stable.   Original Report Authenticated By: Gaylyn Rong, M.D.    Dg Chest Port 1 View  03/15/2012  *RADIOLOGY REPORT*  Clinical Data: Altered mental status  PORTABLE CHEST - 1 VIEW  Comparison: 03/12/2012  Findings: Cardiomediastinal silhouette is stable.  Again noted chronic mild bronchitic changes.  No acute infiltrate or pleural effusion.  No pulmonary edema.  IMPRESSION: No active disease.  No significant change.   Original Report Authenticated By: Natasha Mead, M.D.      1. Altered mental status       MDM  Patient will be readmitted to observation status for long-term  placement. Patient not able to take care of herself at home. Patient was just discharged from the hospital on Sunday have been just admitted overnight for observation for same type of problem on Saturday. Discussed with hospitalist team temporary middle orders placed. A repeat a head CT chest x-ray basic labs and urine without any significant changes or significant abnormalities.        Shelda Jakes, MD 03/15/12 1524

## 2012-03-15 NOTE — H&P (Signed)
[name] History and Physical  ANALISE GLOTFELTY UJW:[id number] DOB: October 17, 1945 DOA: 03/15/2012  Referring physician: Dr. Vanetta Mulders PCP: Quitman Livings, MD   Chief Complaint: AMS   History of Present Illness: April Branch is an 67 y.o. female with PMH as detailed below who was just hospitalized 03/12/12-03/13/12 for acute toxic encephalopathy, thought to be from polypharmacy.  She had responded well to narcan with clearing of her sensorium to baseline values prior to discharge.  The patient currently seems disoriented, and cannot give me any details about the events that led her to be brought back to the hospital.  She cannot tell me what the date is, but does know what her date of birth is.  Denies pain.  The patient's son states that her issues began when she began when she came down with a flu like illness several days ago.  She was brought to the hospital where she improved to her usual baseline.  Seemed improved initially, but then developed recurrent weakness, confusion, lethargy prompting her son to bring her back to the hospital.    Review of Systems: Constitutional: No fever, no chills;  Appetite diminished; No weight loss, no weight gain.  HEENT: No blurry vision, no diplopia, no pharyngitis, no dysphagia CV: No chest pain, no palpitations.  Resp: No SOB, no cough. GI: No nausea, no vomiting, + diarrhea, no melena, no hematochezia.  GU: No dysuria, no hematuria.  MSK: no myalgias, no arthralgias.  Neuro:  No headache, no focal neurological deficits, no history of seizures.  Psych: No depression, no anxiety.  Endo: No thyroid disease, no DM, no heat intolerance, no cold intolerance, no polyuria, no polydipsia  Skin: No rashes, no skin lesions.  Heme: No easy bruising, no history of blood diseases.  Past Medical History Past Medical History  Diagnosis Date  . Hyperlipidemia   . Arthritis   . Hypothyroidism   . GERD (gastroesophageal reflux disease)   . Anxiety   .  Diabetes mellitus age 28    insulin dependent  . Peripheral vascular disease   . Lumbar disc disease   . Insulin dependent type 2 diabetes mellitus, controlled   . GERD 07/20/2006  . CAD (coronary artery disease)   . CAD (coronary artery disease) of bypass graft   . Vertigo   . History of transient ischemic attack (TIA)   . Myocardial infarction     AGE 44    . Anginal pain   . Hypertension   . Depression   . COPD 12/14/2008    PATIENT DENIES    . Stroke     3 MINI STROKES  RT SIDED WEAKNESS  . Personal history of malignant neoplasm of kidney 12/14/2008    Laparoscopic biopsy and cryoablation 7/08 Dr. Normajean Baxter   . Headache   . Neuromuscular disorder     PERIPHERAL NEUROPATHY  . Anemia   . Cancer     CANCER OF KIDNEY  DR Olivia Canter      Past Surgical History Past Surgical History  Procedure Date  . Appendectomy   . Abdominal hysterectomy   . Knee surgery   . Cholecystectomy   . Lung surgery     lung nodule removed from the right side  . Laparascopic cryoablation of left kidney 08/2006    Dr. Sherre Poot for renal cell cancer  . Bladder suspension   . Shoulder surgery   . Foot surgery   . Pr vein bypass graft,aorto-fem-pop 05/27/2010  . Cardiac catheterization   . Femoral-popliteal  bypass graft 10/12/2011    Procedure: BYPASS GRAFT FEMORAL-POPLITEAL ARTERY;  Surgeon: Sherren Kerns, MD;  Location: [facility] OR;  Service: Vascular;  Laterality: Left;  Left Femoral-Popliteal Bypass Graft using 6mm x 80cm Propaten Graft with intraop arteriogram times one.  . Intraoperative arteriogram 10/12/2011    Procedure: INTRA OPERATIVE ARTERIOGRAM;  Surgeon: Sherren Kerns, MD;  Location: [facility] OR;  Service: Vascular;  Laterality: Left;     Social History: History   Social History  . Marital Status: Widowed    Spouse Name: N/A    Number of Children: 2  . Years of Education: N/A   Occupational History  .     Social History Main Topics  . Smoking status: Former Smoker -- 50 years     Types: Cigarettes    Quit date: 07/12/2011  . Smokeless tobacco: Not on file  . Alcohol Use: No  . Drug Use: No  . Sexually Active:    Other Topics Concern  . Not on file   Social History Narrative   Widow. Two sons. Husband died of lung cancer.      Family History:  Family History  Problem Relation Age of Onset  . Heart disease Mother   . Heart disease Father   . Heart disease Sister   . Lung cancer Mother   . Lung cancer Father     Allergies: Niacin-simvastatin er  Meds: Prior to Admission medications   Medication Sig Start Date End Date Taking? Authorizing Provider  amitriptyline (ELAVIL) 75 MG tablet Take 75 mg by mouth at bedtime.   Yes Historical Provider, MD  clopidogrel (PLAVIX) 75 MG tablet Take 75 mg by mouth every morning.   Yes Historical Provider, MD  insulin aspart (NOVOLOG) 100 UNIT/ML injection Inject 4-6 Units into the skin 2 (two) times daily.    Yes Historical Provider, MD  insulin glargine (LANTUS) 100 UNIT/ML injection Inject 60 Units into the skin at bedtime.     Yes Historical Provider, MD  lansoprazole (PREVACID) 30 MG capsule Take 30 mg by mouth every morning.   Yes Historical Provider, MD  levothyroxine (SYNTHROID, LEVOTHROID) 88 MCG tablet Take 88 mcg by mouth daily before breakfast.   Yes Historical Provider, MD  metFORMIN (GLUCOPHAGE) 850 MG tablet Take 850 mg by mouth 2 (two) times daily with a meal.   Yes Historical Provider, MD  metoprolol tartrate (LOPRESSOR) 25 MG tablet Take 25 mg by mouth 2 (two) times daily.   Yes Historical Provider, MD  oxyCODONE-acetaminophen (PERCOCET/ROXICET) 5-325 MG per tablet Take 1 tablet by mouth 4 (four) times daily as needed. For pain   Yes Historical Provider, MD  pravastatin (PRAVACHOL) 20 MG tablet Take 20 mg by mouth daily.   Yes Historical Provider, MD  tolterodine (DETROL LA) 4 MG 24 hr capsule Take 4 mg by mouth every morning.   Yes Historical Provider, MD    Physical Exam: Filed Vitals:   03/15/12  1126  BP: 152/81  Pulse: 87  Temp: 98 F (36.7 C)  TempSrc: Oral  Resp: 18  SpO2: 99%     Physical Exam: Blood pressure 152/81, pulse 87, temperature 98 F (36.7 C), temperature source Oral, resp. rate 18, SpO2 99.00%. Gen: No acute distress but slow to bond to questions. Head: Normocephalic, atraumatic. Eyes: Pupils slightly dilated. Round, reactive to light and accommodation. Extraocular movements intact. Sclerae nonicteric. Mouth: Oropharynx clear with dry mucous membranes. Dentures intact. Neck: Supple, no thyromegaly, no lymphadenopathy, no jugular venous distention. Chest: Lungs  diminished in the bases. CV: Regular rate, and rhythm. No murmurs, rubs, or gallops. Abdomen: Soft, nontender, nondistended with normal active bowel sounds. Extremities: No clubbing, edema, or cyanosis. Skin: Multiple abrasions to the upper extremities and face from dog scratches. No obvious infection. Neuro: Alert but completely disoriented to date and place. Cranial nerves II through XII are grossly intact. Moves all extremities weakly but with equal strength. Psych: Flat affect.  Labs on Admission:  Basic Metabolic Panel:  Lab 03/15/12 9147 03/13/12 0412 03/12/12 1047  NA 137 141 141  K 3.9 4.0 3.9  CL 100 107 106  CO2 23 22 25   GLUCOSE 116* 106* 120*  BUN 17 19 32*  CREATININE 0.95 0.82 1.35*  CALCIUM 9.6 8.7 9.1  MG -- -- --  PHOS -- -- --   Liver Function Tests:  Lab 03/15/12 1251 03/13/12 0412 03/12/12 1047  AST 16 14 13   ALT 15 11 11   ALKPHOS 99 75 78  BILITOT 0.3 0.2* 0.2*  PROT 8.1 6.7 7.0  ALBUMIN 3.6 2.9* 3.1*    Lab 03/12/12 1047  AMMONIA 18   CBC:  Lab 03/15/12 1251 03/13/12 0412 03/12/12 1047  WBC 10.2 6.8 6.1  NEUTROABS -- -- 3.5  HGB 12.0 10.6* 11.3*  HCT 35.9* 32.6* 34.1*  MCV 76.7* 78.6 78.4  PLT 389 282 247    BNP (last 3 results)  Basename 05/09/11 2338  PROBNP 39.2   CBG:  Lab 03/13/12 1208 03/13/12 0745 03/12/12 2236 03/12/12 1827 03/12/12  1034  GLUCAP 130* 97 118* 118* 123*    Radiological Exams on Admission: Ct Head Wo Contrast  03/15/2012  *RADIOLOGY REPORT*  Clinical Data: Altered mental status.  Confusion.  Headache.  CT HEAD WITHOUT CONTRAST  Technique:  Contiguous axial images were obtained from the base of the skull through the vertex without contrast.  Comparison: 03/12/2012  Findings: The brain stem, cerebellum, cerebral peduncles, thalami, basal ganglia, basilar cisterns, and ventricular system appear unremarkable.  No intracranial hemorrhage, mass lesion, or acute infarction is identified.  Prior right maxillary sinus surgery noted.  There is atherosclerotic calcification of the carotid siphons.  IMPRESSION:  1.  No acute intracranial findings.  Stable.   Original Report Authenticated By: Gaylyn Rong, M.D.    Dg Chest Port 1 View  03/15/2012  *RADIOLOGY REPORT*  Clinical Data: Altered mental status  PORTABLE CHEST - 1 VIEW  Comparison: 03/12/2012  Findings: Cardiomediastinal silhouette is stable.  Again noted chronic mild bronchitic changes.  No acute infiltrate or pleural effusion.  No pulmonary edema.  IMPRESSION: No active disease.  No significant change.   Original Report Authenticated By: Natasha Mead, M.D.      Assessment/Plan Principal Problem:  *Encephalopathy, toxic  Recurrent encephalopathy of uncertain etiology but polypharmacy remains high in the differential.  We'll obtain MRI of the brain.  Given Narcan in the emergency department.  Recheck urine drug screen. Recheck urine culture. Urine culture done on 03/12/2012 was negative.  She was influenza tested during her last hospital stay, and this was negative.  Discontinue all sedating medications including Elavil and oxycodone. It is unclear as to what exactly the patient is taking at home but on her previous hospital stay, her urine drug screen was positive for benzodiazepines.  Physical therapy and occupational therapy evaluation requested.  Patient may need rehabilitation.  We'll have the nursing staff contact the patient's home health nurse to find out the results of her home safety evaluation. Active Problems:  Hypothyroidism  TSH  was elevated but free T4 was within normal limits during her previous hospital stay. This could represent sick euthyroid syndrome versus noncompliance with Synthroid.  DISORDER, TOBACCO USE  Counsel when more alert.  Hypertension  Continue metoprolol.  COPD  No bronchospasm on exam.  GERD  Continue PPI.  Hyperlipidemia  Continue Zocor.  Chronic pain syndrome  Avoid narcotics.  Code Status: Full. Family Communication: History obtained from son, Rene Paci, by telephone.  [phone]. Disposition Plan: Home versus rehab depending on progress.  Time spent: 1 hour  RAMA,CHRISTINA [name] Pager [phone]  If 7PM-7AM, please contact night-coverage [email or web address] Password [facility] 03/15/2012, 3:58 PM

## 2012-03-15 NOTE — ED Notes (Signed)
Family states pt started having confusion and blank staring this past Friday, called EMS on Saturday morning, was admitted to hospital and discharged on Sunday. Pt still sits w/ a blank stare, and when communicating most of the time is confused. Family states pt has decreased appetite also.

## 2012-03-16 DIAGNOSIS — G92 Toxic encephalopathy: Principal | ICD-10-CM

## 2012-03-16 DIAGNOSIS — E119 Type 2 diabetes mellitus without complications: Secondary | ICD-10-CM

## 2012-03-16 DIAGNOSIS — I10 Essential (primary) hypertension: Secondary | ICD-10-CM

## 2012-03-16 LAB — URINE CULTURE

## 2012-03-16 LAB — GLUCOSE, CAPILLARY: Glucose-Capillary: 115 mg/dL — ABNORMAL HIGH (ref 70–99)

## 2012-03-16 MED ORDER — ENSURE COMPLETE PO LIQD
237.0000 mL | Freq: Two times a day (BID) | ORAL | Status: DC
  Administered 2012-03-17 – 2012-03-18 (×4): 237 mL via ORAL

## 2012-03-16 NOTE — Progress Notes (Addendum)
[name] PROGRESS NOTE  April Branch:[id number] DOB: 1945/03/12 DOA: 03/15/2012 PCP: Quitman Livings, MD  Assessment/Plan: Encephalopathy, toxic  Recurrent encephalopathy of uncertain etiology but polypharmacy remains high in the differential.   MRI of the brain NEG for acute findings.  Given Narcan in the emergency department.  Recheck urine drug screen neg Recheck UA as that done on admit was contaminated. Urine culture done on 03/12/2012 was negative.  She was influenza tested during her last hospital stay, and this was negative.   all sedating medications including Elavil and oxycodone discontinued on admission. It is unclear as to what exactly the patient is taking at home but on her previous hospital stay, her urine drug screen was positive for benzodiazepines - but noted to be neg this admit.  Physical therapy and occupational therapy evaluation requested. Patient may need rehabilitation.  I have consulted psychiatry to eval for capacity and also per family's request - they are concerned that for sometime she has not been able to handle her financial Active Problems:  Hypothyroidism  TSH was elevated but free T4 was within normal limits during her previous hospital stay. This could represent sick euthyroid syndrome versus noncompliance with Synthroid. DISORDER, TOBACCO USE  Counsel when more alert. Hypertension  Continue metoprolol. COPD  No bronchospasm on exam. GERD  Continue PPI. Hyperlipidemia  Continue Zocor. Chronic pain syndrome  Avoid narcotics. Code Status: Full.  Family Communication:  Son at bedside.  Disposition Plan: Home versus rehab depending on progress.   Consultants:  Psychiatry- eval pending  Procedures:  none  Antibiotics:  none  HPI/Subjective: Pt sleepy but aroused and oriented x2, denies any complaints.  Objective: Filed Vitals:   03/15/12 1719 03/15/12 2055 03/16/12 0530 03/16/12 1500  BP:  147/74 156/87 155/82  Pulse:   75 66 62  Temp:  98.5 F (36.9 C) 98.1 F (36.7 C) 98.5 F (36.9 C)  TempSrc:  Oral Oral Oral  Resp:  22 20 18   Weight: 64.275 kg (141 lb 11.2 oz)     SpO2:  97% 97% 96%    Intake/Output Summary (Last 24 hours) at 03/16/12 1707 Last data filed at 03/16/12 1245  Gross per 24 hour  Intake 1226.25 ml  Output      0 ml  Net 1226.25 ml   Filed Weights   03/15/12 1719  Weight: 64.275 kg (141 lb 11.2 oz)    Exam:   General:  Sleepy but arousable, orientedx2, in NAD  Cardiovascular: RRR, nl S1S2  Respiratory: CTAB  Abdomen: soft, +BS NT/ND  Data Reviewed: Basic Metabolic Panel:  Lab 03/15/12 8119 03/13/12 0412 03/12/12 1047  NA 137 141 141  K 3.9 4.0 3.9  CL 100 107 106  CO2 23 22 25   GLUCOSE 116* 106* 120*  BUN 17 19 32*  CREATININE 0.95 0.82 1.35*  CALCIUM 9.6 8.7 9.1  MG -- -- --  PHOS -- -- --   Liver Function Tests:  Lab 03/15/12 1251 03/13/12 0412 03/12/12 1047  AST 16 14 13   ALT 15 11 11   ALKPHOS 99 75 78  BILITOT 0.3 0.2* 0.2*  PROT 8.1 6.7 7.0  ALBUMIN 3.6 2.9* 3.1*   No results found for this basename: LIPASE:5,AMYLASE:5 in the last 168 hours  Lab 03/12/12 1047  AMMONIA 18   CBC:  Lab 03/15/12 1251 03/13/12 0412 03/12/12 1047  WBC 10.2 6.8 6.1  NEUTROABS -- -- 3.5  HGB 12.0 10.6* 11.3*  HCT 35.9* 32.6* 34.1*  MCV 76.7* 78.6 78.4  PLT 389 282 247   Cardiac Enzymes: No results found for this basename: CKTOTAL:5,CKMB:5,CKMBINDEX:5,TROPONINI:5 in the last 168 hours BNP (last 3 results)  Basename 05/09/11 2338  PROBNP 39.2   CBG:  Lab 03/16/12 1214 03/16/12 0806 03/15/12 2200 03/15/12 1754 03/13/12 1208  GLUCAP 98 115* 123* 77 130*    Recent Results (from the past 240 hour(s))  URINE CULTURE     Status: Normal   Collection Time   03/12/12 10:52 AM      Component Value Range Status Comment   Specimen Description URINE, CATHETERIZED   Final    Special Requests NONE   Final    Culture  Setup Time 03/12/2012 21:01   Final     Colony Count NO GROWTH   Final    Culture NO GROWTH   Final    Report Status 03/13/2012 FINAL   Final   MRSA PCR SCREENING     Status: Normal   Collection Time   03/12/12  5:34 PM      Component Value Range Status Comment   MRSA by PCR NEGATIVE  NEGATIVE Final   MRSA PCR SCREENING     Status: Normal   Collection Time   03/16/12 10:16 AM      Component Value Range Status Comment   MRSA by PCR NEGATIVE  NEGATIVE Final      Studies: Ct Head Wo Contrast  03/15/2012  *RADIOLOGY REPORT*  Clinical Data: Altered mental status.  Confusion.  Headache.  CT HEAD WITHOUT CONTRAST  Technique:  Contiguous axial images were obtained from the base of the skull through the vertex without contrast.  Comparison: 03/12/2012  Findings: The brain stem, cerebellum, cerebral peduncles, thalami, basal ganglia, basilar cisterns, and ventricular system appear unremarkable.  No intracranial hemorrhage, mass lesion, or acute infarction is identified.  Prior right maxillary sinus surgery noted.  There is atherosclerotic calcification of the carotid siphons.  IMPRESSION:  1.  No acute intracranial findings.  Stable.   Original Report Authenticated By: Gaylyn Rong, M.D.    Mr Laqueta Jean Wo Contrast  03/16/2012  *RADIOLOGY REPORT*  Clinical Data: Altered mental status.  Renal cancer  MRI HEAD WITHOUT AND WITH CONTRAST  Technique:  Multiplanar, multiecho pulse sequences of the brain and surrounding structures were obtained according to standard protocol without and with intravenous contrast  Contrast: 13mL MULTIHANCE GADOBENATE DIMEGLUMINE 529 MG/ML IV SOLN  Comparison: CT head 03/15/2012  Findings: Negative for acute infarct.  Mild chronic changes in the cerebral white matter bilaterally.  Brainstem and cerebellum are intact.  Negative for intracranial hemorrhage or fluid collection.  Postcontrast imaging reveals normal enhancement.  Negative for metastatic disease.  Prior sinus surgery on the right.  Paranasal sinuses are  clear.  IMPRESSION: Mild chronic microvascular ischemia in the white matter.  No acute abnormality.   Original Report Authenticated By: Janeece Riggers, M.D.    Dg Chest Port 1 View  03/15/2012  *RADIOLOGY REPORT*  Clinical Data: Altered mental status  PORTABLE CHEST - 1 VIEW  Comparison: 03/12/2012  Findings: Cardiomediastinal silhouette is stable.  Again noted chronic mild bronchitic changes.  No acute infiltrate or pleural effusion.  No pulmonary edema.  IMPRESSION: No active disease.  No significant change.   Original Report Authenticated By: Natasha Mead, M.D.     Scheduled Meds:   . clopidogrel  75 mg Oral q morning - 10a  . enoxaparin (LOVENOX) injection  40 mg Subcutaneous Q24H  . feeding supplement  237 mL Oral BID BM  .  fesoterodine  8 mg Oral Daily  . insulin aspart  0-15 Units Subcutaneous TID WC  . insulin aspart  0-5 Units Subcutaneous QHS  . levothyroxine  88 mcg Oral QAC breakfast  . metFORMIN  850 mg Oral BID WC  . metoprolol tartrate  25 mg Oral BID  . pantoprazole  20 mg Oral Daily  . simvastatin  10 mg Oral q1800   Continuous Infusions:   . sodium chloride 100 mL/hr at 03/15/12 1533  . sodium chloride 1,000 mL (03/16/12 0634)    Principal Problem:  *Encephalopathy, toxic Active Problems:  Hypothyroidism  DISORDER, TOBACCO USE  Hypertension  COPD  GERD  Hyperlipidemia  Chronic pain syndrome    Time spent:30MINS    Kela Millin  [name] Pager [phone]. If 8PM-8AM, please contact night-coverage at [email or web address], password [facility] 03/16/2012, 5:07 PM  LOS: 1 day

## 2012-03-16 NOTE — Evaluation (Signed)
Physical Therapy Evaluation Patient Details Name: April Branch MRN: [id number] DOB: Nov 09, 1945 Today's Date: 03/16/2012 Time: 1355-     PT Assessment / Plan / Recommendation Clinical Impression  pt adm with Recurrent encephalopathy of uncertain etiology but polypharmacy remains high in the differential; will benefit form PT  to maximize independence for next venue of care; pt continues to be disoriented and  is unsafe to be home alone at her current status, therefore will recommend SNF unless pt mental status clears    PT Assessment  Patient needs continued PT services    Follow Up Recommendations  SNF    Does the patient have the potential to tolerate intense rehabilitation      Barriers to Discharge        Equipment Recommendations  None recommended by PT    Recommendations for Other Services     Frequency      Precautions / Restrictions Precautions Precautions: Fall   Pertinent Vitals/Pain      Mobility  Bed Mobility Bed Mobility: Supine to Sit Supine to Sit: 4: Min guard Details for Bed Mobility Assistance: for safety Transfers Transfers: Sit to Stand;Stand to Sit Sit to Stand: 4: Min assist Stand to Sit: 4: Min guard Details for Transfer Assistance: min  assist due to initialy unsteadiness in standing; pt uses incorrect hand placement for transfers(uses RW at home) Ambulation/Gait Ambulation/Gait Assistance: 4: Min assist;4: Min Government social research officer (Feet): 120 Feet Assistive device: Rolling walker Ambulation/Gait Assistance Details: min/guard for safety Gait Pattern: Step-through pattern    Shoulder Instructions     Exercises     PT Diagnosis: Difficulty walking  PT Problem List: Decreased activity tolerance;Decreased balance;Decreased mobility;Decreased safety awareness PT Treatment Interventions: Gait training;Functional mobility training;Therapeutic activities;Therapeutic exercise;Balance training;Patient/family education   PT  Goals Acute Rehab PT Goals PT Goal Formulation: With patient Time For Goal Achievement: 03/30/12 Potential to Achieve Goals: Fair Pt will go Supine/Side to Sit: with modified independence PT Goal: Supine/Side to Sit - Progress: Goal set today Pt will go Sit to Supine/Side: with modified independence PT Goal: Sit to Supine/Side - Progress: Goal set today Pt will go Sit to Stand: with modified independence PT Goal: Sit to Stand - Progress: Goal set today Pt will go Stand to Sit: with modified independence PT Goal: Stand to Sit - Progress: Goal set today Pt will Ambulate: 51 - 150 feet;with rolling walker;with modified independence PT Goal: Ambulate - Progress: Goal set today  Visit Information  Last PT Received On: 03/16/12 PT/OT Co-Evaluation/Treatment: Yes    Subjective Data      Prior Functioning  Home Living Lives With: Son (doesn't help) Type of Home: House Home Access: Stairs to enter [employer] of Steps: 2 Home Layout: One level Home Adaptive Equipment: Walker - rolling;Bedside commode/3-in-1 Additional Comments: ghas 8 pit bull puppies at home, hence the scratches on pt's UEs Prior Function Level of Independence: Independent with assistive device(s) Able to Take Stairs?: Yes Driving: Yes Comments: has  Communication Communication: No difficulties Dominant Hand:  (pt cannot state)    Cognition  Overall Cognitive Status: Impaired Area of Impairment: Memory Arousal/Alertness: Awake/alert Orientation Level: Disoriented to;Time;Situation Behavior During Session: Flat affect Memory Deficits: unable to state/recall if she is left or right handed, requires extra time to recall friend's name Cognition - Other Comments: answers most questions, does not intiate conversation/verbalize spontaneously    Extremity/Trunk Assessment Right Lower Extremity Assessment RLE ROM/Strength/Tone: [facility] for tasks assessed Left Lower Extremity Assessment LLE  ROM/Strength/Tone: [facility]  for tasks assessed   Balance    End of Session PT - End of Session Activity Tolerance: Patient tolerated treatment well Patient left: in chair;with call bell/phone within reach;with family/visitor present  GP Functional Assessment Tool Used: clinical judgement  Functional Limitation: Mobility: Walking and moving around Mobility: Walking and Moving Around Current Status ([id number]): At least 1 percent but less than 20 percent impaired, limited or restricted Mobility: Walking and Moving Around Goal Status [phone]): 0 percent impaired, limited or restricted   [facility] 03/16/2012, 2:36 PM

## 2012-03-16 NOTE — Progress Notes (Signed)
Patient pulled out IV, when asked if I could restart one patient nodded head "no" and waved bye bye.

## 2012-03-16 NOTE — Progress Notes (Signed)
Pt was d/c home with [facility] services to be provided by [facility]. Per [facility], a home safety evaluation was completed on Monday 1/20 and they found 8 pit bull puppies in the home that the pt does not want removed. They seem to think that is the source of the scabs she has. The thought they thought they were infected and they reported this to pt's PCP. They also stated they refilled pt's pill box and labelled it and taught the caregiver about pt medications. They also stated that pt is willing to learn about her meds and how to safely administer them.  HHPT eval had not been completed yet as the pt was readmitted.

## 2012-03-16 NOTE — Evaluation (Signed)
Occupational Therapy Evaluation Patient Details Name: April Branch MRN: [id number] DOB: 10/24/1945 Today's Date: 03/16/2012 Time: 1191-4782 OT Time Calculation (min): 28 min  OT Assessment / Plan / Recommendation Clinical Impression  Pt admitted with AMS of unknown etiology, polypharmacy suspected.  Pt conversing little.  Poor memory.  Pt is not safe to return home at this point.  Does not have adequate support in the home.  Recommending SNF.    OT Assessment  Patient needs continued OT Services    Follow Up Recommendations  SNF    Barriers to Discharge Decreased caregiver support    Equipment Recommendations  None recommended by OT    Recommendations for Other Services    Frequency  Min 2X/week    Precautions / Restrictions Precautions Precautions: Fall Restrictions Weight Bearing Restrictions: No   Pertinent Vitals/Pain HA, acute,did not rate, back ache, chronic, did not rate    ADL  Eating/Feeding:  (pt reports friend feeding her, pt with poor appetite) Where Assessed - Eating/Feeding: Bed level Grooming: Minimal assistance Where Assessed - Grooming: Unsupported standing Upper Body Bathing: Minimal assistance Where Assessed - Upper Body Bathing: Unsupported sitting Lower Body Bathing: Minimal assistance Where Assessed - Lower Body Bathing: Supported sit to stand Upper Body Dressing: Minimal assistance Where Assessed - Upper Body Dressing: Unsupported sitting Lower Body Dressing: Minimal assistance Where Assessed - Lower Body Dressing: Supported sit to stand;Unsupported sitting Equipment Used: Rolling walker Transfers/Ambulation Related to ADLs: ambulated in hall with min to min guard assist, pt uses RW at home ADL Comments: Pt able to access feet to donn socks by crossing foot over opposite knee    OT Diagnosis: Generalized weakness;Cognitive deficits;Acute pain  OT Problem List: Decreased activity tolerance;Decreased strength;Impaired balance (sitting and/or  standing);Decreased cognition;Decreased knowledge of use of DME or AE;Decreased safety awareness;Pain OT Treatment Interventions: Self-care/ADL training;Cognitive remediation/compensation;Patient/family education   OT Goals Acute Rehab OT Goals OT Goal Formulation: Patient unable to participate in goal setting Time For Goal Achievement: 03/30/12 Potential to Achieve Goals: Good ADL Goals Pt Will Perform Eating: Sitting, chair;Independently ADL Goal: Eating - Progress: Goal set today Pt Will Perform Grooming: with supervision;Standing at sink ADL Goal: Grooming - Progress: Goal set today Pt Will Perform Upper Body Bathing: with supervision;Sitting at sink ADL Goal: Upper Body Bathing - Progress: Goal set today Pt Will Perform Lower Body Bathing: with supervision;Sit to stand from chair;Sitting at sink ADL Goal: Lower Body Bathing - Progress: Goal set today Pt Will Perform Upper Body Dressing: with supervision;Sitting, bed ADL Goal: Upper Body Dressing - Progress: Goal set today Pt Will Perform Lower Body Dressing: with supervision;Sit to stand from bed;Sitting, bed ADL Goal: Lower Body Dressing - Progress: Goal set today Pt Will Transfer to Toilet: with supervision;Ambulation;Regular height toilet ADL Goal: Toilet Transfer - Progress: Goal set today Pt Will Perform Toileting - Clothing Manipulation: with supervision;Standing ADL Goal: Toileting - Clothing Manipulation - Progress: Goal set today Pt Will Perform Toileting - Hygiene: with supervision;Sitting on 3-in-1 or toilet ADL Goal: Toileting - Hygiene - Progress: Goal set today  Visit Information  Last OT Received On: 03/16/12 Assistance Needed: +1 PT/OT Co-Evaluation/Treatment: Yes    Subjective Data  Subjective: "I'm cold." Patient Stated Goal: Did not state, pt with minimal verbalization.   Prior Functioning     Home Living Lives With: Son Available Help at Discharge: Friend(s);Available PRN/intermittently (sons are of  minimum help per friend "Kandis Mannan") Type of Home: House Home Access: Stairs to enter [employer] of Steps: 2  Home Layout: One level Home Adaptive Equipment: Walker - rolling;Bedside commode/3-in-1 Additional Comments: ghas 8 pit bull puppies at home, hence the scratches on pt's UEs Prior Function Level of Independence: Independent with assistive device(s) Able to Take Stairs?: Yes Driving: Yes Comments: has  Communication Communication: No difficulties Dominant Hand: Right         Vision/Perception     Cognition  Overall Cognitive Status: Impaired Area of Impairment: Memory Arousal/Alertness: Awake/alert Orientation Level: Disoriented to;Time;Situation Behavior During Session: Flat affect Memory Deficits: unable to state/recall if she is left or right handed, requires extra time to recall friend's name Cognition - Other Comments: answers most questions, does not intiate conversation/verbalize spontaneously    Extremity/Trunk Assessment Right Upper Extremity Assessment RUE ROM/Strength/Tone: WFL for tasks assessed RUE Coordination: WFL - gross/fine motor Left Upper Extremity Assessment LUE ROM/Strength/Tone: WFL for tasks assessed LUE Coordination: WFL - gross/fine motor Right Lower Extremity Assessment RLE ROM/Strength/Tone: WFL for tasks assessed Left Lower Extremity Assessment LLE ROM/Strength/Tone: WFL for tasks assessed Trunk Assessment Trunk Assessment: Normal     Mobility Bed Mobility Bed Mobility: Supine to Sit Supine to Sit: 4: Min guard Details for Bed Mobility Assistance: for safety Transfers Transfers: Sit to Stand;Stand to Sit Sit to Stand: 4: Min assist Stand to Sit: 4: Min guard Details for Transfer Assistance: min  assist due to initialy unsteadiness in standing; pt uses incorrect hand placement for transfers(uses RW at home)     Shoulder Instructions     Exercise     Balance     End of Session OT - End of Session Activity  Tolerance: Patient tolerated treatment well Patient left: in chair;with call bell/phone within reach;with family/visitor present  GO Functional Assessment Tool Used: clinical judgement Functional Limitation: Self care Self Care Current Status (W0981): At least 20 percent but less than 40 percent impaired, limited or restricted Self Care Goal Status (X9147): At least 1 percent but less than 20 percent impaired, limited or restricted   Evern Bio 03/16/2012, 2:53 PM

## 2012-03-16 NOTE — Progress Notes (Signed)
INITIAL NUTRITION ASSESSMENT  Pt meets criteria for severe MALNUTRITION in the context of chronic illness as evidenced by 23% weight loss in the past 10 months with <75% estimated energy intake in the past month.  DOCUMENTATION CODES Per approved criteria  -Severe malnutrition in the context of chronic illness   INTERVENTION: - Ensure Complete BID for additional nutrition  - Recommend nursing continue to encourage pt at mealtimes - Will continue to monitor   NUTRITION DIAGNOSIS: Inadequate oral intake related to toxic encephalopathy as evidenced by variable intake, 0-50% of meals.   Goal: Pt to consume >90% of meals/supplements.   Monitor:  Weights, labs, intake  Reason for Assessment: Nutrition risk   67 y.o. female  Admitting Dx: Encephalopathy, toxic  ASSESSMENT: Attempted to talk with pt, however pt did not respond verbally, just looks and blinks. No family present. Per H&P, family had noted pt with poor appetite PTA. Pt did not eat breakfast but consumed 50% of lunch tray. Per nursing screen there was a report that pt has lost 56 pounds unintentionally in the past year. Past weight trends show pt weighed 184 pounds in March 2013, now down to 141 pounds. Intake during previous admission this month was poor, only 25% of meals.   Height: Ht Readings from Last 1 Encounters:  03/12/12 5\' 6"  (1.676 m)    Weight: Wt Readings from Last 1 Encounters:  03/15/12 141 lb 11.2 oz (64.275 kg)    Ideal Body Weight: 130 lb  % Ideal Body Weight: 108  Wt Readings from Last 10 Encounters:  03/15/12 141 lb 11.2 oz (64.275 kg)  03/12/12 144 lb (65.318 kg)  12/31/11 143 lb (64.864 kg)  11/26/11 142 lb (64.411 kg)  11/02/11 140 lb 9.6 oz (63.776 kg)  10/22/11 140 lb (63.504 kg)  10/16/11 143 lb (64.864 kg)  10/14/11 152 lb 12.5 oz (69.3 kg)  10/14/11 152 lb 12.5 oz (69.3 kg)  10/08/11 142 lb (64.411 kg)    Usual Body Weight: 140-152 lb  % Usual Body Weight: 100  BMI:  22.9  kg/(m^2).   Estimated Nutritional Needs: Kcal: 1600-1900 Protein: 65-80g Fluid: 1.6-1.9L/day   Diet Order: Carb Control  EDUCATION NEEDS: -No education needs identified at this time   Intake/Output Summary (Last 24 hours) at 03/16/12 1431 Last data filed at 03/16/12 1245  Gross per 24 hour  Intake    870 ml  Output      0 ml  Net    870 ml    Last BM: 1/21  Labs:   Lab 03/15/12 1251 03/13/12 0412 03/12/12 1047  NA 137 141 141  K 3.9 4.0 3.9  CL 100 107 106  CO2 23 22 25   BUN 17 19 32*  CREATININE 0.95 0.82 1.35*  CALCIUM 9.6 8.7 9.1  MG -- -- --  PHOS -- -- --  GLUCOSE 116* 106* 120*    CBG (last 3)   Basename 03/16/12 1214 03/16/12 0806 03/15/12 2200  GLUCAP 98 115* 123*    Scheduled Meds:   . clopidogrel  75 mg Oral q morning - 10a  . enoxaparin (LOVENOX) injection  40 mg Subcutaneous Q24H  . fesoterodine  8 mg Oral Daily  . insulin aspart  0-15 Units Subcutaneous TID WC  . insulin aspart  0-5 Units Subcutaneous QHS  . levothyroxine  88 mcg Oral QAC breakfast  . metFORMIN  850 mg Oral BID WC  . metoprolol tartrate  25 mg Oral BID  . pantoprazole  20  mg Oral Daily  . simvastatin  10 mg Oral q1800    Continuous Infusions:   . sodium chloride 100 mL/hr at 03/15/12 1533  . sodium chloride 1,000 mL (03/16/12 1610)    Past Medical History  Diagnosis Date  . Hyperlipidemia   . Arthritis   . Hypothyroidism   . GERD (gastroesophageal reflux disease)   . Anxiety   . Diabetes mellitus age 53    insulin dependent  . Peripheral vascular disease   . Lumbar disc disease   . Insulin dependent type 2 diabetes mellitus, controlled   . GERD 07/20/2006  . CAD (coronary artery disease)   . CAD (coronary artery disease) of bypass graft   . Vertigo   . History of transient ischemic attack (TIA)   . Myocardial infarction     AGE 62    . Anginal pain   . Hypertension   . Depression   . COPD 12/14/2008    PATIENT DENIES    . Stroke     3 MINI STROKES   RT SIDED WEAKNESS  . Personal history of malignant neoplasm of kidney 12/14/2008    Laparoscopic biopsy and cryoablation 7/08 Dr. Normajean Baxter   . Headache   . Neuromuscular disorder     PERIPHERAL NEUROPATHY  . Anemia   . Cancer     CANCER OF KIDNEY  DR Olivia Canter     Past Surgical History  Procedure Date  . Appendectomy   . Abdominal hysterectomy   . Knee surgery   . Cholecystectomy   . Lung surgery     lung nodule removed from the right side  . Laparascopic cryoablation of left kidney 08/2006    Dr. Sherre Poot for renal cell cancer  . Bladder suspension   . Shoulder surgery   . Foot surgery   . Pr vein bypass graft,aorto-fem-pop 05/27/2010  . Cardiac catheterization   . Femoral-popliteal bypass graft 10/12/2011    Procedure: BYPASS GRAFT FEMORAL-POPLITEAL ARTERY;  Surgeon: Sherren Kerns, MD;  Location: [facility] OR;  Service: Vascular;  Laterality: Left;  Left Femoral-Popliteal Bypass Graft using 6mm x 80cm Propaten Graft with intraop arteriogram times one.  . Intraoperative arteriogram 10/12/2011    Procedure: INTRA OPERATIVE ARTERIOGRAM;  Surgeon: Sherren Kerns, MD;  Location: [facility] OR;  Service: Vascular;  Laterality: Left;    Levon Hedger MS, RD, LDN [phone] Pager [phone] After Hours Pager

## 2012-03-16 NOTE — Progress Notes (Signed)
Patient's son Robert's wife called and wanted to speak with the doctor.  They would like her evaluated by a psychiatrist for competency.  I took the number and relayed this information to Dr. Suanne Marker.

## 2012-03-16 NOTE — Progress Notes (Signed)
Patient's sister approached staff to establish herself with proper forms to be the legal healthcare POA.  During conversation the sister explained she needs to pay bills for the patient.  I informed her financial poa  was not covered in a healthcare POA.  I spoke with Becky Sax, CSW regarding notary assistance.

## 2012-03-17 DIAGNOSIS — G3184 Mild cognitive impairment, so stated: Secondary | ICD-10-CM

## 2012-03-17 DIAGNOSIS — E039 Hypothyroidism, unspecified: Secondary | ICD-10-CM

## 2012-03-17 DIAGNOSIS — F341 Dysthymic disorder: Secondary | ICD-10-CM

## 2012-03-17 LAB — URINALYSIS, ROUTINE W REFLEX MICROSCOPIC
Leukocytes, UA: NEGATIVE
Nitrite: NEGATIVE
Specific Gravity, Urine: 1.019 (ref 1.005–1.030)
Urobilinogen, UA: 0.2 mg/dL (ref 0.0–1.0)

## 2012-03-17 LAB — GLUCOSE, CAPILLARY: Glucose-Capillary: 118 mg/dL — ABNORMAL HIGH (ref 70–99)

## 2012-03-17 LAB — URINE MICROSCOPIC-ADD ON

## 2012-03-17 MED ORDER — HALOPERIDOL LACTATE 5 MG/ML IJ SOLN
INTRAMUSCULAR | Status: AC
  Filled 2012-03-17: qty 1

## 2012-03-17 MED ORDER — HALOPERIDOL LACTATE 5 MG/ML IJ SOLN
5.0000 mg | Freq: Four times a day (QID) | INTRAMUSCULAR | Status: DC | PRN
  Administered 2012-03-17: 5 mg via INTRAMUSCULAR

## 2012-03-17 NOTE — Progress Notes (Signed)
Pt is now calm and asleep. Pt is not restrained. Bed alarm is on and family friend is at the bedside.
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
## 2012-03-17 NOTE — Progress Notes (Addendum)
Pt became agitated around 0100 am and attempted to leave the unit. Pt was confused and agitated. Security was called to the room.  Pt would not follow commands and pulled out her IV access. MD notified and Pt was given 5mg  Haldol IM. MD also wrote an order for restraints which are not currently in use because the pt agreed to remain in the bed and not leave the unit. Pt having some hallucinations but is less agitated. Pt still does not have any IV access currently, she has pulled out 3 lines in the past 24 hours. MD aware

## 2012-03-17 NOTE — Progress Notes (Signed)
[name] PROGRESS NOTE  April Branch ZOX:[id number] DOB: [birth year] DOA: 03/15/2012 PCP: Quitman Livings, MD  Assessment/Plan: Encephalopathy, toxic  Recurrent encephalopathy of uncertain etiology but polypharmacy remains high in the differential.   MRI of the brain NEG for acute findings.  Given Narcan in the emergency department.  Recheck urine drug screen neg Recheck UA as that done on admit was contaminated. Urine culture done on 03/12/2012 was negative.  She was influenza tested during her last hospital stay, and this was negative.   all sedating medications including Elavil and oxycodone discontinued on admission. It is unclear as to what exactly the patient is taking at home but on her previous hospital stay, her urine drug screen was positive for benzodiazepines - but noted to be neg this admit.  Physical therapy and occupational therapy evaluation requested. Patient may need rehabilitation.  Awaiting psychiatry to eval for capacity and also per family's request - they are concerned that for sometime she has not been able to handle her financial ? Psychotic episode last p.m., also awaiting psychiatry input and recommendations are. Active Problems:  Hypothyroidism  TSH was elevated but free T4 was within normal limits during her previous hospital stay. This could represent sick euthyroid syndrome versus noncompliance with Synthroid vs undertreated hypothyoidism- follow up outpt. DISORDER, TOBACCO USE  Counsel when more alert. Hypertension  Continue metoprolol. COPD  No bronchospasm on exam. GERD  Continue PPI. Hyperlipidemia  Continue Zocor. Chronic pain syndrome  Avoiding narcotics. Code Status: Full.  Family Communication:  Daughter-in-law via phone [phone].  Disposition Plan: Home versus rehab depending on progress.   Consultants:  Psychiatry- eval pending  Procedures:  none  Antibiotics:  none  HPI/Subjective: Pt had episode of agitation,  confused and trying to leave hospital last pm- given IM haldol. Now calm alert and oriented x3 this a.m.-apologetic about last PM's events.she denies any complaints.  Objective: Filed Vitals:   03/16/12 2149 03/16/12 2200 03/17/12 0600 03/17/12 1518  BP: 146/86 170/86 138/66 152/70  Pulse:  71 57 68  Temp:  98.7 F (37.1 C) 98.2 F (36.8 C) 98.6 F (37 C)  TempSrc:  Oral Oral Oral  Resp:  18 18 16   Height:      Weight:      SpO2:  95% 94% 96%    Intake/Output Summary (Last 24 hours) at 03/17/12 1858 Last data filed at 03/17/12 1856  Gross per 24 hour  Intake 1597.5 ml  Output     20 ml  Net 1577.5 ml   Filed Weights   03/15/12 1719  Weight: 64.275 kg (141 lb 11.2 oz)    Exam:   General:  Alert and orientedx3, in NAD  Cardiovascular: RRR, nl S1S2  Respiratory: CTAB  Abdomen: soft, +BS NT/ND  Data Reviewed: Basic Metabolic Panel:  Lab 03/15/12 5621 03/13/12 0412 03/12/12 1047  NA 137 141 141  K 3.9 4.0 3.9  CL 100 107 106  CO2 23 22 25   GLUCOSE 116* 106* 120*  BUN 17 19 32*  CREATININE 0.95 0.82 1.35*  CALCIUM 9.6 8.7 9.1  MG -- -- --  PHOS -- -- --   Liver Function Tests:  Lab 03/15/12 1251 03/13/12 0412 03/12/12 1047  AST 16 14 13   ALT 15 11 11   ALKPHOS 99 75 78  BILITOT 0.3 0.2* 0.2*  PROT 8.1 6.7 7.0  ALBUMIN 3.6 2.9* 3.1*   No results found for this basename: LIPASE:5,AMYLASE:5 in the last 168 hours  Lab 03/12/12 1047  AMMONIA  18   CBC:  Lab 03/15/12 1251 03/13/12 0412 03/12/12 1047  WBC 10.2 6.8 6.1  NEUTROABS -- -- 3.5  HGB 12.0 10.6* 11.3*  HCT 35.9* 32.6* 34.1*  MCV 76.7* 78.6 78.4  PLT 389 282 247   Cardiac Enzymes: No results found for this basename: CKTOTAL:5,CKMB:5,CKMBINDEX:5,TROPONINI:5 in the last 168 hours BNP (last 3 results)  Basename 05/09/11 2338  PROBNP 39.2   CBG:  Lab 03/17/12 1704 03/17/12 1136 03/17/12 0728 April 14, 2012 2131 04/14/12 1757  GLUCAP 124* 127* 118* 123* 122*    Recent Results (from the past  240 hour(s))  URINE CULTURE     Status: Normal   Collection Time   03/12/12 10:52 AM      Component Value Range Status Comment   Specimen Description URINE, CATHETERIZED   Final    Special Requests NONE   Final    Culture  Setup Time 03/12/2012 21:01   Final    Colony Count NO GROWTH   Final    Culture NO GROWTH   Final    Report Status 03/13/2012 FINAL   Final   MRSA PCR SCREENING     Status: Normal   Collection Time   03/12/12  5:34 PM      Component Value Range Status Comment   MRSA by PCR NEGATIVE  NEGATIVE Final   URINE CULTURE     Status: Normal   Collection Time   03/15/12  1:31 PM      Component Value Range Status Comment   Specimen Description URINE, CLEAN CATCH   Final    Special Requests NONE   Final    Culture  Setup Time 04/14/2012 01:29   Final    Colony Count 35,000 COLONIES/ML   Final    Culture     Final    Value: Multiple bacterial morphotypes present, none predominant. Suggest appropriate recollection if clinically indicated.   Report Status 04-14-2012 FINAL   Final   MRSA PCR SCREENING     Status: Normal   Collection Time   2012/04/14 10:16 AM      Component Value Range Status Comment   MRSA by PCR NEGATIVE  NEGATIVE Final      Studies: Mr Laqueta Jean RU Contrast  04/14/2012  *RADIOLOGY REPORT*  Clinical Data: Altered mental status.  Renal cancer  MRI HEAD WITHOUT AND WITH CONTRAST  Technique:  Multiplanar, multiecho pulse sequences of the brain and surrounding structures were obtained according to standard protocol without and with intravenous contrast  Contrast: 13mL MULTIHANCE GADOBENATE DIMEGLUMINE 529 MG/ML IV SOLN  Comparison: CT head 03/15/2012  Findings: Negative for acute infarct.  Mild chronic changes in the cerebral white matter bilaterally.  Brainstem and cerebellum are intact.  Negative for intracranial hemorrhage or fluid collection.  Postcontrast imaging reveals normal enhancement.  Negative for metastatic disease.  Prior sinus surgery on the right.   Paranasal sinuses are clear.  IMPRESSION: Mild chronic microvascular ischemia in the white matter.  No acute abnormality.   Original Report Authenticated By: Janeece Riggers, M.D.     Scheduled Meds:    . clopidogrel  75 mg Oral q morning - 10a  . enoxaparin (LOVENOX) injection  40 mg Subcutaneous Q24H  . feeding supplement  237 mL Oral BID BM  . fesoterodine  8 mg Oral Daily  . insulin aspart  0-15 Units Subcutaneous TID WC  . insulin aspart  0-5 Units Subcutaneous QHS  . levothyroxine  88 mcg Oral QAC breakfast  . metFORMIN  850 mg Oral BID WC  . metoprolol tartrate  25 mg Oral BID  . pantoprazole  20 mg Oral Daily  . simvastatin  10 mg Oral q1800   Continuous Infusions:    . sodium chloride 100 mL/hr at 03/15/12 1533  . sodium chloride 75 mL/hr at 03/17/12 1345    Principal Problem:  *Encephalopathy, toxic Active Problems:  Hypothyroidism  DISORDER, TOBACCO USE  Hypertension  COPD  GERD  Hyperlipidemia  Chronic pain syndrome    Time spent:30MINS    Kela Millin  [name] Pager [phone]. If 8PM-8AM, please contact night-coverage at [email or web address], password [facility] 03/17/2012, 6:58 PM  LOS: 2 days

## 2012-03-18 DIAGNOSIS — J449 Chronic obstructive pulmonary disease, unspecified: Secondary | ICD-10-CM

## 2012-03-18 DIAGNOSIS — G894 Chronic pain syndrome: Secondary | ICD-10-CM

## 2012-03-18 LAB — GLUCOSE, CAPILLARY
Glucose-Capillary: 122 mg/dL — ABNORMAL HIGH (ref 70–99)
Glucose-Capillary: 102 mg/dL — ABNORMAL HIGH (ref 70–99)

## 2012-03-18 MED ORDER — DULOXETINE HCL 30 MG PO CPEP
30.0000 mg | ORAL_CAPSULE | Freq: Every day | ORAL | Status: DC
  Administered 2012-03-18: 30 mg via ORAL
  Filled 2012-03-18: qty 1

## 2012-03-18 MED ORDER — DULOXETINE HCL 30 MG PO CPEP: 30.0000 mg | ORAL_CAPSULE | Freq: Every day | ORAL | Status: DC

## 2012-03-18 MED ORDER — ENSURE COMPLETE PO LIQD: 237.0000 mL | Freq: Two times a day (BID) | ORAL | Status: DC

## 2012-03-18 NOTE — Clinical Documentation Improvement (Signed)
MALNUTRITION DOCUMENTATION CLARIFICATION  THIS DOCUMENT IS NOT A PERMANENT PART OF THE MEDICAL RECORD  TO RESPOND TO THE THIS QUERY, FOLLOW THE INSTRUCTIONS BELOW:  1. If needed, update documentation for the patient's encounter via the notes activity.  2. Access this query again and click edit on the In [employer].  3. After updating, or not, click F2 to complete all highlighted (required) fields concerning your review. Select "additional documentation in the medical record" OR "no additional documentation provided".  4. Click Sign note button.  5. The deficiency will fall out of your In Basket *Please let us know if you are not able to complete this workflow by phone or e-mail (listed below).  Please update your documentation within the medical record to reflect your response to this query.                                                                                        03/18/12   Dear Dr.[facility],  In a better effort to capture your patient's severity of illness, reflect appropriate length of stay and utilization of resources, a review of the patient medical record has revealed the following indicators.    Based on your clinical judgment, please clarify and document in a progress note and/or discharge summary the clinical condition associated with the following supporting information:  In responding to this query please exercise your independent judgment.  The fact that a query is asked, does not imply that any particular answer is desired or expected.  03/16/12 Nurt Noted criteria for "-Severe malnutrition in the context of chronic illness." For accurate Dx specificity & severity please help validate noted nurt documentation for clinical cond being eval'd, mon'd & tx'd. Thank you    Possible Clinical Conditions? . Mild Malnutrition  . Moderate Malnutrition . Severe Malnutrition   . Protein Calorie Malnutrition . Severe Protein Calorie Malnutrition  .  Emaciation  . Cachexia   Other Condition (please specify) Cannot Clinically Determine    Supporting Information: Risk Factors: See Nutrition Eval 03/16/12  Signs & Symptoms: See Nutrition Eval 03/16/12  -Diagnostics: See Nutrition Eval 03/16/12  Treatments: See Nutrition Eval 03/16/12  -Medications:  -Nutrition Consult: See Nutrition Eval 03/16/12   You may use possible, probable, or suspect with inpatient documentation. possible, probable, suspected diagnoses MUST be documented at the time of discharge  Reviewed: additional documentation in the medical record  Thank You,  Toribio Harbour, RN, BSN, CCDS Certified Clinical Documentation Specialist Pager: [phone]  Health Information Management [place]

## 2012-03-18 NOTE — Discharge Summary (Addendum)
Physician Discharge Summary  April Branch ZOX:[id number] DOB: [birth year] DOA: 03/15/2012  PCP: April Livings, MD  Admit date: 03/15/2012 Discharge date: 03/18/2012  Time spent: >30 minutes  Recommendations for Outpatient Follow-up:      Follow-up Information    [facility] up with [facility], [facility]. (in 1week, call for appt upon discharge)    Contact information:   [address] [place] [address] [phone]           Discharge Diagnoses:  Principal Problem:  *Encephalopathy, toxic Active Problems:  Hypothyroidism  DISORDER, TOBACCO USE  Hypertension  COPD  GERD  Hyperlipidemia  Chronic pain syndrome Protein calorie malnutrition  Discharge Condition: improved/stable  Diet recommendation:modified carbohydrate  Filed Weights   03/15/12 1719  Weight: 64.275 kg (141 lb 11.2 oz)    History of present illness:  April Branch is an 67 y.o. female with PMH as detailed below who was just hospitalized 03/12/12-03/13/12 for acute toxic encephalopathy, thought to be from polypharmacy. She had responded well to narcan with clearing of her sensorium to baseline values prior to discharge. The patient currently seems disoriented, and cannot give me any details about the events that led her to be brought back to the hospital. She cannot tell me what the date is, but does know what her date of birth is. Denies pain. The patient's son states that her issues began when she began when she came down with a flu like illness several days ago. She was brought to the hospital where she improved to her usual baseline. Seemed improved initially, but then developed recurrent weakness, confusion, lethargy prompting her son to bring her back to the hospital   Hospital Course:  Encephalopathy, toxic  Recurrent encephalopathy of uncertain etiology but polypharmacy remains high in the differential.  Upon readmission an MRI of the brain was done and came back NEG for acute findings.  She  was Given Narcan in the emergency department.  Recheck urine drug screen neg, Recheck UA as that done on admit was contaminated and a repeat clean specimen obtained following admission came back negative. Urine culture done on 03/12/2012 was negative.  She was influenza tested during her last hospital stay, and this was negative.  all sedating medications including Elavil and oxycodone discontinued this admission. It is unclear as to what exactly the patient is taking at home but on her previous hospital stay, her urine drug screen was positive for benzodiazepines - but noted to be neg this admit. Psychiatry was consulted to see the patient and April Branch saw her and recommended Cymbalta and was started. She has been instructed to stay off the above the elavil and oxycodone she was on previously Physical therapy and occupational therapy evaluation requested. Physical therapy recommended a skilled nursing initially patient's mental status improved and she declined a skilled nursing.  April Branch saw and stated that she lacks capacity to manage her own life, and the psychiatry social worker and followed up with patient and her sister and they came up with a good plan-to assist her to pay her bills and also contact DSS for assistance, and her sister was in agreement with  patient's and decision to be discharged back home and not to skilled nursing facility Patient improved clinically, she has been alert lucid and is medically stable for discharge at this time on Cymbalta as above she is to follow up outpatient with PCP.Marland Kitchen Active Problems:  Hypothyroidism  TSH was elevated but free T4 was within normal limits during  her previous hospital stay. This could represent sick euthyroid syndrome versus noncompliance with Synthroid vs undertreated hypothyoidism- follow up outpt with her PCP for recheck thyroid function tests and further treatment as appropriate. DISORDER, TOBACCO USE  Patient was counseled to quit tobacco  while in the hospital.. Hypertension  Continue metoprolol. COPD  No bronchospasm on exam,remained stable during hospital stay.Marland Kitchen GERD  Continue PPI. Hyperlipidemia  Continue Zocor. Chronic pain syndrome  Avoiding narcotics as discussed above. She is been discharged on Cymbalta.  Protein calorie malnutrition -Nutrition was consulted, saw patient and he was placed on Ensure nutritional supplements. She is to follow up outpatient. Diabetes mellitus -She was maintained on her metformin during this hospital stay and covered with sliding scale insulin. On this regimen her blood glucose was ranging from 98 -127 during this hospital stay and so Lantus was not resumed. She is to follow up with PCP for continued monitoring of her blood sugars and resumption of Lantus when clinically appropriate. Home health RN to follow up outpatient to assist with monitoring of her diabetes and medication compliance.  Procedures: NONE Consultations:  Psychiatry - Dr Ferol Branch  Discharge Exam: Filed Vitals:   03/18/12 0851 03/18/12 1145 03/18/12 1340 03/18/12 1407  BP: 169/80 147/79 146/82 153/72  Pulse: 75 59 58 61  Temp: 98.2 F (36.8 C) 98.1 F (36.7 C) 97.8 F (36.6 C) 97.8 F (36.6 C)  TempSrc: Oral Oral Oral Oral  Resp: 18  16 18   Height:      Weight:      SpO2: 96% 99% 97% 99%   Exam:  General: Alert and orientedx3, in NAD  Cardiovascular: RRR, nl S1S2  Respiratory: CTAB  Abdomen: soft, +BS NT/ND    Discharge Instructions  Discharge Orders    Future Appointments: Provider: Department: Dept Phone: Branch:   04/07/2012 3:00 PM [facility] [phone] [facility]   04/07/2012 3:30 PM [facility] [phone] [facility]   04/07/2012 4:00 PM Evern Bio, NP [facility] [phone] [facility]     Future Orders Please Complete By Expires   Diet Carb Modified      Increase activity slowly           Medication List     As of 03/18/2012  4:05 PM    STOP taking these medications         amitriptyline 75 MG tablet   Commonly known as: ELAVIL      insulin aspart 100 UNIT/ML injection   Commonly known as: novoLOG      insulin glargine 100 UNIT/ML injection   Commonly known as: LANTUS      oxyCODONE-acetaminophen 5-325 MG per tablet   Commonly known as: PERCOCET/ROXICET      TAKE these medications         clopidogrel 75 MG tablet   Commonly known as: PLAVIX   Take 75 mg by mouth every morning.      DULoxetine 30 MG capsule   Commonly known as: CYMBALTA   Take 1 capsule (30 mg total) by mouth daily.      feeding supplement Liqd   Take 237 mLs by mouth 2 (two) times daily between meals.      lansoprazole 30 MG capsule   Commonly known as: PREVACID   Take 30 mg by mouth every morning.      levothyroxine 88 MCG tablet   Commonly known as: SYNTHROID, LEVOTHROID   Take 88  mcg by mouth daily before breakfast.      metFORMIN 850 MG tablet   Commonly known as: GLUCOPHAGE   Take 850 mg by mouth 2 (two) times daily with a meal.      metoprolol tartrate 25 MG tablet   Commonly known as: LOPRESSOR   Take 25 mg by mouth 2 (two) times daily.      pravastatin 20 MG tablet   Commonly known as: PRAVACHOL   Take 20 mg by mouth daily.      tolterodine 4 MG 24 hr capsule   Commonly known as: DETROL LA   Take 4 mg by mouth every morning.           Follow-up Information    Follow up with [facility], MD. (in 1week, call for appt upon discharge)    Contact information:   [address] [place] [address] [phone]           The results of significant diagnostics from this hospitalization (including imaging, microbiology, ancillary and laboratory) are listed below for reference.    Significant Diagnostic Studies: Ct Head Wo Contrast  03/15/2012  *RADIOLOGY REPORT*  Clinical Data: Altered mental status.  Confusion.  Headache.  CT HEAD WITHOUT  CONTRAST  Technique:  Contiguous axial images were obtained from the base of the skull through the vertex without contrast.  Comparison: 03/12/2012  Findings: The brain stem, cerebellum, cerebral peduncles, thalami, basal ganglia, basilar cisterns, and ventricular system appear unremarkable.  No intracranial hemorrhage, mass lesion, or acute infarction is identified.  Prior right maxillary sinus surgery noted.  There is atherosclerotic calcification of the carotid siphons.  IMPRESSION:  1.  No acute intracranial findings.  Stable.   Original Report Authenticated By: Gaylyn Rong, M.D.    Ct Head Wo Contrast  03/12/2012  *RADIOLOGY REPORT*  Clinical Data: Flu like symptoms with altered mental status. Diabetic.  CT HEAD WITHOUT CONTRAST  Technique:  Contiguous axial images were obtained from the base of the skull through the vertex without contrast.  Comparison: 07/31/2011 MRI.  07/05/2007 CT.  Findings: Bone windows demonstrate mucosal thickening of right ethmoid air cells.  Prior right-sided antrostomy. Clear mastoid air cells.  Soft tissue windows demonstrate no  mass lesion, hemorrhage, hydrocephalus, acute infarct, intra-axial, or extra-axial fluid collection.  IMPRESSION: 1. No acute intracranial abnormality. 2.  Sinus disease.   Original Report Authenticated By: Jeronimo Greaves, M.D.    Mr Laqueta Jean Wo Contrast  03/16/2012  *RADIOLOGY REPORT*  Clinical Data: Altered mental status.  Renal cancer  MRI HEAD WITHOUT AND WITH CONTRAST  Technique:  Multiplanar, multiecho pulse sequences of the brain and surrounding structures were obtained according to standard protocol without and with intravenous contrast  Contrast: 13mL MULTIHANCE GADOBENATE DIMEGLUMINE 529 MG/ML IV SOLN  Comparison: CT head 03/15/2012  Findings: Negative for acute infarct.  Mild chronic changes in the cerebral white matter bilaterally.  Brainstem and cerebellum are intact.  Negative for intracranial hemorrhage or fluid collection.   Postcontrast imaging reveals normal enhancement.  Negative for metastatic disease.  Prior sinus surgery on the right.  Paranasal sinuses are clear.  IMPRESSION: Mild chronic microvascular ischemia in the white matter.  No acute abnormality.   Original Report Authenticated By: Janeece Riggers, M.D.    Dg Chest Port 1 View  03/15/2012  *RADIOLOGY REPORT*  Clinical Data: Altered mental status  PORTABLE CHEST - 1 VIEW  Comparison: 03/12/2012  Findings: Cardiomediastinal silhouette is stable.  Again noted chronic mild bronchitic  changes.  No acute infiltrate or pleural effusion.  No pulmonary edema.  IMPRESSION: No active disease.  No significant change.   Original Report Authenticated By: Natasha Mead, M.D.    Dg Chest Port 1 View  03/12/2012  *RADIOLOGY REPORT*  Clinical Data: Altered mental status and wheezing.  PORTABLE CHEST - 1 VIEW  Comparison: 10/05/2011  Findings: Midline trachea.  Normal heart size.  Atherosclerosis in the transverse aorta which is age advanced. No pleural effusion or pneumothorax.  There is moderate interstitial thickening, which is accentuated by the diminished lung volumes on the current exam. No lobar consolidation.  Cholecystectomy.  IMPRESSION: 1.  No acute cardiopulmonary disease. 2.  Mild peribronchial thickening which may relate to chronic bronchitis or smoking.   Original Report Authenticated By: Jeronimo Greaves, M.D.     Microbiology: Recent Results (from the past 240 hour(s))  URINE CULTURE     Status: Normal   Collection Time   03/12/12 10:52 AM      Component Value Range Status Comment   Specimen Description URINE, CATHETERIZED   Final    Special Requests NONE   Final    Culture  Setup Time 03/12/2012 21:01   Final    Colony Count NO GROWTH   Final    Culture NO GROWTH   Final    Report Status 03/13/2012 FINAL   Final   MRSA PCR SCREENING     Status: Normal   Collection Time   03/12/12  5:34 PM      Component Value Range Status Comment   MRSA by PCR NEGATIVE  NEGATIVE  Final   URINE CULTURE     Status: Normal   Collection Time   03/15/12  1:31 PM      Component Value Range Status Comment   Specimen Description URINE, CLEAN CATCH   Final    Special Requests NONE   Final    Culture  Setup Time 03/16/2012 01:29   Final    Colony Count 35,000 COLONIES/ML   Final    Culture     Final    Value: Multiple bacterial morphotypes present, none predominant. Suggest appropriate recollection if clinically indicated.   Report Status 03/16/2012 FINAL   Final   MRSA PCR SCREENING     Status: Normal   Collection Time   03/16/12 10:16 AM      Component Value Range Status Comment   MRSA by PCR NEGATIVE  NEGATIVE Final      Labs: Basic Metabolic Panel:  Lab 03/15/12 4098 03/13/12 0412 03/12/12 1047  NA 137 141 141  K 3.9 4.0 3.9  CL 100 107 106  CO2 23 22 25   GLUCOSE 116* 106* 120*  BUN 17 19 32*  CREATININE 0.95 0.82 1.35*  CALCIUM 9.6 8.7 9.1  MG -- -- --  PHOS -- -- --   Liver Function Tests:  Lab 03/15/12 1251 03/13/12 0412 03/12/12 1047  AST 16 14 13   ALT 15 11 11   ALKPHOS 99 75 78  BILITOT 0.3 0.2* 0.2*  PROT 8.1 6.7 7.0  ALBUMIN 3.6 2.9* 3.1*   No results found for this basename: LIPASE:5,AMYLASE:5 in the last 168 hours  Lab 03/12/12 1047  AMMONIA 18   CBC:  Lab 03/15/12 1251 03/13/12 0412 03/12/12 1047  WBC 10.2 6.8 6.1  NEUTROABS -- -- 3.5  HGB 12.0 10.6* 11.3*  HCT 35.9* 32.6* 34.1*  MCV 76.7* 78.6 78.4  PLT 389 282 247   Cardiac Enzymes: No results found for  this basename: CKTOTAL:5,CKMB:5,CKMBINDEX:5,TROPONINI:5 in the last 168 hours BNP: BNP (last 3 results)  Basename 05/09/11 2338  PROBNP 39.2   CBG:  Lab 03/18/12 1134 03/18/12 0715 03/17/12 2128 03/17/12 1704 03/17/12 1136  GLUCAP 102* 122* 106* 124* 127*       Signed:  Arijana Narayan C  [name] 03/18/2012, 4:05 PM

## 2012-03-18 NOTE — Progress Notes (Signed)
Met with Pt for an extended period to time to discuss her current home situation.  Pt reported that she has strong familial support, as well as strong support by her neighbors and by her boyfriend.  Pt admitted that she was getting confused and that it's likely that she was taking too many medications.  Pt stated that this has been addressed by the [facility] agency, as Pt's boyfriend has been educated on how to dispense her medications and that he has all of her scripts.  Pt stated that there was concern about the number of dogs in her home but that this has been addressed, as the dogs have been removed from the home.  Pt was adamant that the home was sanitary but that the sheer number of dogs was the concern.  CSW spoke with Pt's sister via phone and offered community resources to assist Pt with her back bills.  CSW provided the number for DSS and discussed with sister the payee services provided by DSS.  Pt was in agreement that this was a good plan and Pt's sister to look into this, as well.  Pt's sister stated that she will work with Pt to get the bills caught up but that, after that, she cannot emotionally continue to care for Pt's bill, as she is dealing with significant loss, at this time.  Pt stated that her boyfriend is a strong support and that he's committed to caring for her.  Pt admitted that her 65 year old son is a drain on her financially and that she is going to ask him to move out.  She explained that she is supporting him financially and that she's realized that she can no longer do this.  Pt understands that she has to set boundaries with him and enforce them.  CSW thanked Pt for her time.  Conferred with psych MD and MD.  No further CSW needs identified.    CSW to sign off.  Providence Crosby, LCSWA Clinical Social Work [phone] '

## 2012-03-26 DIAGNOSIS — E1169 Type 2 diabetes mellitus with other specified complication: Secondary | ICD-10-CM

## 2012-03-26 HISTORY — DX: Type 2 diabetes mellitus with other specified complication: E11.69

## 2012-04-07 ENCOUNTER — Ambulatory Visit: Payer: Self-pay | Admitting: Neurosurgery

## 2012-04-09 ENCOUNTER — Other Ambulatory Visit: Payer: Self-pay

## 2012-04-28 ENCOUNTER — Ambulatory Visit (INDEPENDENT_AMBULATORY_CARE_PROVIDER_SITE_OTHER): Payer: Medicare HMO | Admitting: Neurosurgery

## 2012-04-28 ENCOUNTER — Encounter (INDEPENDENT_AMBULATORY_CARE_PROVIDER_SITE_OTHER): Payer: Medicare HMO | Admitting: *Deleted

## 2012-04-28 ENCOUNTER — Encounter: Payer: Self-pay | Admitting: Neurosurgery

## 2012-04-28 DIAGNOSIS — I739 Peripheral vascular disease, unspecified: Secondary | ICD-10-CM

## 2012-04-28 DIAGNOSIS — Z48812 Encounter for surgical aftercare following surgery on the circulatory system: Secondary | ICD-10-CM

## 2012-04-28 DIAGNOSIS — M79609 Pain in unspecified limb: Secondary | ICD-10-CM

## 2012-04-28 NOTE — Progress Notes (Signed)
[facility] Office Note  CC: PAD surveillance Referring Physician: Fields  History of Present Illness: 67 year old female patient of Dr. Darrick Penna status post a left femoropopliteal bypass graft. The patient denies claudication or rest pain. The patient states she was in a diabetic, a few months ago and has no recollection of a incident itself.  Past Medical History  Diagnosis Date  . Hyperlipidemia   . Arthritis   . Hypothyroidism   . GERD (gastroesophageal reflux disease)   . Anxiety   . Diabetes mellitus age 16    insulin dependent  . Peripheral vascular disease   . Lumbar disc disease   . Insulin dependent type 2 diabetes mellitus, controlled   . GERD 07/20/2006  . CAD (coronary artery disease)   . CAD (coronary artery disease) of bypass graft   . Vertigo   . History of transient ischemic attack (TIA)   . Myocardial infarction     AGE 16    . Anginal pain   . Hypertension   . Depression   . COPD 12/14/2008    PATIENT DENIES    . Stroke     3 MINI STROKES  RT SIDED WEAKNESS  . Personal history of malignant neoplasm of kidney 12/14/2008    Laparoscopic biopsy and cryoablation 7/08 Dr. Normajean Baxter   . Headache   . Neuromuscular disorder     PERIPHERAL NEUROPATHY  . Anemia   . 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 Diabetic coma Feb. 2014    ROS: [x]  Positive   [ ]  Denies    General: [ ]  Weight loss, [ ]  Fever, [ ]  chills Neurologic: [ x] Dizziness, [ ]  Blackouts, [ ]  Seizure [ ]  Stroke, [ ]  "Mini stroke", [ ]  Slurred speech, [ ]  Temporary blindness; [ ]  weakness in arms or legs, [ ]  Hoarseness Cardiac: [ x] Chest pain/pressure, [ ]  Shortness of breath at rest [ ]  Shortness of breath with exertion, [ ]  Atrial fibrillation or irregular heartbeat Vascular: [x ] Pain in legs with walking, [ ]  Pain in legs at rest, [ ]  Pain in legs at night,  [ ]  Non-healing ulcer, [x ] Blood clot in vein/DVT,   Pulmonary: [ ]  Home oxygen, [ ]   Productive cough, [ ]  Coughing up blood, [ ]  Asthma,  [ ]  Wheezing Musculoskeletal:  [ ]  Arthritis, [ ]  Low back pain, [ ]  Joint pain Hematologic: [ ]  Easy Bruising, [ ]  Anemia; [ ]  Hepatitis Gastrointestinal: [ ]  Blood in stool, [ ]  Gastroesophageal Reflux/heartburn, [ ]  Trouble swallowing Urinary: [ ]  chronic Kidney disease, [ ]  on HD - [ ]  MWF or [ ]  TTHS, [ ]  Burning with urination, [ ]  Difficulty urinating Skin: [ ]  Rashes, [ ]  Wounds Psychological: [ ]  Anxiety, [ ]  Depression   Social History History  Substance Use Topics  . Smoking status: Former Smoker -- 50 years    Types: Cigarettes    Quit date: 07/12/2011  . Smokeless tobacco: Never Used  . Alcohol Use: No    Family History Family History  Problem Relation Age of Onset  . Heart disease Mother   . Heart disease Father   . Heart disease Sister   . Lung cancer Mother   . Lung cancer Father     Allergies  Allergen Reactions  . Niacin-Simvastatin Er Hives    Current Outpatient Prescriptions  Medication Sig Dispense Refill  . clopidogrel (PLAVIX)  75 MG tablet Take 75 mg by mouth every morning.      . DULoxetine (CYMBALTA) 30 MG capsule Take 1 capsule (30 mg total) by mouth daily.  30 capsule  0  . feeding supplement (ENSURE COMPLETE) LIQD Take 237 mLs by mouth 2 (two) times daily between meals.      . lansoprazole (PREVACID) 30 MG capsule Take 30 mg by mouth every morning.      Marland Kitchen levothyroxine (SYNTHROID, LEVOTHROID) 88 MCG tablet Take 88 mcg by mouth daily before breakfast.      . metFORMIN (GLUCOPHAGE) 850 MG tablet Take 850 mg by mouth 2 (two) times daily with a meal.      . metoprolol tartrate (LOPRESSOR) 25 MG tablet Take 25 mg by mouth 2 (two) times daily.      . pravastatin (PRAVACHOL) 20 MG tablet Take 20 mg by mouth daily.      Marland Kitchen tolterodine (DETROL LA) 4 MG 24 hr capsule Take 4 mg by mouth every morning.      . [DISCONTINUED] Gabapentin (NEURONTIN PO) Take by mouth. New medicine, pt doesn't remember  dosage       No current facility-administered medications for this visit.    Physical Examination  There were no vitals filed for this visit.  There is no weight on file to calculate BMI.  General:  WDWN in NAD Gait: Normal HEENT: WNL Eyes: Pupils equal Pulmonary: normal non-labored breathing , without Rales, rhonchi,  wheezing Cardiac: RRR, without  Murmurs, rubs or gallops; No carotid bruits Abdomen: soft, NT, no masses Skin: no rashes, ulcers noted Vascular Exam/Pulses: Palpable femoral and lower stream pulses bilaterally  Extremities without ischemic changes, no Gangrene , no cellulitis; no open wounds;  Musculoskeletal: no muscle wasting or atrophy  Neurologic: A&O X 3; Appropriate Affect ; SENSATION: normal; MOTOR FUNCTION:  moving all extremities equally. Speech is fluent/normal  Non-Invasive Vascular Imaging: ABIs are 0.9 and biphasic on the right, 1.23 and triphasic on the left with a patent bypass graft  ASSESSMENT/PLAN: Asymptomatic patient will followup in one year with repeat ABIs and graft duplex. The patient's questions were encouraged and answered, she is in agreement with this plan.  Lauree Chandler ANP  Clinic M.D.: Fields

## 2012-05-02 NOTE — Addendum Note (Signed)
Addended by: Sharee Pimple on: 05/02/2012 01:21 PM   Modules accepted: Orders

## 2012-09-21 ENCOUNTER — Inpatient Hospital Stay (HOSPITAL_COMMUNITY)
Admission: EM | Admit: 2012-09-21 | Discharge: 2012-09-23 | DRG: 683 | Disposition: A | Discharge status: Home / Self Care | Payer: Medicare HMO | Attending: Internal Medicine | Admitting: Internal Medicine

## 2012-09-21 ENCOUNTER — Encounter (HOSPITAL_COMMUNITY): Payer: Self-pay | Admitting: Emergency Medicine

## 2012-09-21 ENCOUNTER — Emergency Department (HOSPITAL_COMMUNITY): Payer: Medicare HMO

## 2012-09-21 DIAGNOSIS — I10 Essential (primary) hypertension: Secondary | ICD-10-CM

## 2012-09-21 DIAGNOSIS — Z794 Long term (current) use of insulin: Secondary | ICD-10-CM

## 2012-09-21 DIAGNOSIS — G894 Chronic pain syndrome: Secondary | ICD-10-CM

## 2012-09-21 DIAGNOSIS — E876 Hypokalemia: Secondary | ICD-10-CM | POA: Diagnosis present

## 2012-09-21 DIAGNOSIS — J4489 Other specified chronic obstructive pulmonary disease: Secondary | ICD-10-CM

## 2012-09-21 DIAGNOSIS — Z85528 Personal history of other malignant neoplasm of kidney: Secondary | ICD-10-CM

## 2012-09-21 DIAGNOSIS — I70219 Atherosclerosis of native arteries of extremities with intermittent claudication, unspecified extremity: Secondary | ICD-10-CM

## 2012-09-21 DIAGNOSIS — E039 Hypothyroidism, unspecified: Secondary | ICD-10-CM

## 2012-09-21 DIAGNOSIS — K219 Gastro-esophageal reflux disease without esophagitis: Secondary | ICD-10-CM

## 2012-09-21 DIAGNOSIS — R079 Chest pain, unspecified: Secondary | ICD-10-CM

## 2012-09-21 DIAGNOSIS — E119 Type 2 diabetes mellitus without complications: Secondary | ICD-10-CM

## 2012-09-21 DIAGNOSIS — M79609 Pain in unspecified limb: Secondary | ICD-10-CM

## 2012-09-21 DIAGNOSIS — Z860100 Personal history of colon polyps, unspecified: Secondary | ICD-10-CM

## 2012-09-21 DIAGNOSIS — Z8673 Personal history of transient ischemic attack (TIA), and cerebral infarction without residual deficits: Secondary | ICD-10-CM

## 2012-09-21 DIAGNOSIS — M5106 Intervertebral disc disorders with myelopathy, lumbar region: Secondary | ICD-10-CM

## 2012-09-21 DIAGNOSIS — N189 Chronic kidney disease, unspecified: Secondary | ICD-10-CM

## 2012-09-21 DIAGNOSIS — G8929 Other chronic pain: Secondary | ICD-10-CM | POA: Diagnosis present

## 2012-09-21 DIAGNOSIS — C539 Malignant neoplasm of cervix uteri, unspecified: Secondary | ICD-10-CM

## 2012-09-21 DIAGNOSIS — G929 Unspecified toxic encephalopathy: Secondary | ICD-10-CM

## 2012-09-21 DIAGNOSIS — E872 Acidosis, unspecified: Secondary | ICD-10-CM | POA: Diagnosis present

## 2012-09-21 DIAGNOSIS — I219 Acute myocardial infarction, unspecified: Secondary | ICD-10-CM

## 2012-09-21 DIAGNOSIS — N179 Acute kidney failure, unspecified: Principal | ICD-10-CM

## 2012-09-21 DIAGNOSIS — R112 Nausea with vomiting, unspecified: Secondary | ICD-10-CM

## 2012-09-21 DIAGNOSIS — I200 Unstable angina: Secondary | ICD-10-CM

## 2012-09-21 DIAGNOSIS — I739 Peripheral vascular disease, unspecified: Secondary | ICD-10-CM

## 2012-09-21 DIAGNOSIS — I25810 Atherosclerosis of coronary artery bypass graft(s) without angina pectoris: Secondary | ICD-10-CM | POA: Diagnosis present

## 2012-09-21 DIAGNOSIS — R197 Diarrhea, unspecified: Secondary | ICD-10-CM

## 2012-09-21 DIAGNOSIS — I252 Old myocardial infarction: Secondary | ICD-10-CM

## 2012-09-21 DIAGNOSIS — I2510 Atherosclerotic heart disease of native coronary artery without angina pectoris: Secondary | ICD-10-CM

## 2012-09-21 DIAGNOSIS — E860 Dehydration: Secondary | ICD-10-CM

## 2012-09-21 DIAGNOSIS — Z87891 Personal history of nicotine dependence: Secondary | ICD-10-CM

## 2012-09-21 DIAGNOSIS — M129 Arthropathy, unspecified: Secondary | ICD-10-CM | POA: Diagnosis present

## 2012-09-21 DIAGNOSIS — J449 Chronic obstructive pulmonary disease, unspecified: Secondary | ICD-10-CM | POA: Diagnosis present

## 2012-09-21 DIAGNOSIS — F411 Generalized anxiety disorder: Secondary | ICD-10-CM

## 2012-09-21 DIAGNOSIS — K589 Irritable bowel syndrome without diarrhea: Secondary | ICD-10-CM

## 2012-09-21 DIAGNOSIS — Z86718 Personal history of other venous thrombosis and embolism: Secondary | ICD-10-CM

## 2012-09-21 DIAGNOSIS — G92 Toxic encephalopathy: Secondary | ICD-10-CM

## 2012-09-21 DIAGNOSIS — F17200 Nicotine dependence, unspecified, uncomplicated: Secondary | ICD-10-CM

## 2012-09-21 DIAGNOSIS — E785 Hyperlipidemia, unspecified: Secondary | ICD-10-CM

## 2012-09-21 LAB — CBC WITH DIFFERENTIAL/PLATELET
Basophils Absolute: 0.1 10*3/uL (ref 0.0–0.1)
Basophils Relative: 1 % (ref 0–1)
Eosinophils Absolute: 0.5 10*3/uL (ref 0.0–0.7)
MCH: 26.3 pg (ref 26.0–34.0)
MCHC: 34.4 g/dL (ref 30.0–36.0)
Neutro Abs: 5.3 10*3/uL (ref 1.7–7.7)
Neutrophils Relative %: 64 % (ref 43–77)
RDW: 15.8 % — ABNORMAL HIGH (ref 11.5–15.5)

## 2012-09-21 LAB — HEPATIC FUNCTION PANEL
ALT: 7 U/L (ref 0–35)
AST: 10 U/L (ref 0–37)
Albumin: 3.3 g/dL — ABNORMAL LOW (ref 3.5–5.2)
Total Protein: 6.8 g/dL (ref 6.0–8.3)

## 2012-09-21 LAB — POCT I-STAT TROPONIN I: Troponin i, poc: 0 ng/mL (ref 0.00–0.08)

## 2012-09-21 LAB — URINALYSIS, ROUTINE W REFLEX MICROSCOPIC
Glucose, UA: NEGATIVE mg/dL
Protein, ur: NEGATIVE mg/dL
pH: 5.5 (ref 5.0–8.0)

## 2012-09-21 LAB — TROPONIN I
Troponin I: <0.3 ng/mL (ref <0.30)
Troponin I: <0.3 ng/mL (ref <0.30)

## 2012-09-21 LAB — URINE MICROSCOPIC-ADD ON

## 2012-09-21 LAB — GLUCOSE, CAPILLARY: Glucose-Capillary: 425 mg/dL — ABNORMAL HIGH (ref 70–99)

## 2012-09-21 LAB — BASIC METABOLIC PANEL
BUN: 32 mg/dL — ABNORMAL HIGH (ref 6–23)
Chloride: 96 mEq/L (ref 96–112)
Creatinine, Ser: 2.01 mg/dL — ABNORMAL HIGH (ref 0.50–1.10)
GFR calc Af Amer: 29 mL/min — ABNORMAL LOW (ref >90)
GFR calc non Af Amer: 25 mL/min — ABNORMAL LOW (ref >90)
Potassium: 2.9 mEq/L — ABNORMAL LOW (ref 3.5–5.1)

## 2012-09-21 LAB — PRO B NATRIURETIC PEPTIDE: Pro B Natriuretic peptide (BNP): 330.8 pg/mL — ABNORMAL HIGH (ref 0–125)

## 2012-09-21 LAB — LACTIC ACID, PLASMA: Lactic Acid, Venous: 5.4 mmol/L — ABNORMAL HIGH (ref 0.5–2.2)

## 2012-09-21 MED ORDER — SODIUM CHLORIDE 0.9 % IV SOLN
INTRAVENOUS | Status: DC
  Administered 2012-09-21: 23:00:00 via INTRAVENOUS

## 2012-09-21 MED ORDER — INSULIN ASPART 100 UNIT/ML ~~LOC~~ SOLN
8.0000 [IU] | Freq: Once | SUBCUTANEOUS | Status: AC
  Administered 2012-09-21: 8 [IU] via SUBCUTANEOUS

## 2012-09-21 MED ORDER — POTASSIUM CHLORIDE CRYS ER 20 MEQ PO TBCR
40.0000 meq | EXTENDED_RELEASE_TABLET | Freq: Once | ORAL | Status: AC
  Administered 2012-09-21: 40 meq via ORAL
  Filled 2012-09-21: qty 2

## 2012-09-21 MED ORDER — PANTOPRAZOLE SODIUM 40 MG PO TBEC
40.0000 mg | DELAYED_RELEASE_TABLET | Freq: Every day | ORAL | Status: DC
  Administered 2012-09-22 – 2012-09-23 (×2): 40 mg via ORAL
  Filled 2012-09-21 (×3): qty 1

## 2012-09-21 MED ORDER — AMITRIPTYLINE HCL 75 MG PO TABS
75.0000 mg | ORAL_TABLET | Freq: Every day | ORAL | Status: DC
  Administered 2012-09-21 – 2012-09-22 (×2): 75 mg via ORAL
  Filled 2012-09-21 (×3): qty 1

## 2012-09-21 MED ORDER — FESOTERODINE FUMARATE ER 4 MG PO TB24
4.0000 mg | ORAL_TABLET | Freq: Every day | ORAL | Status: DC
  Administered 2012-09-22 – 2012-09-23 (×2): 4 mg via ORAL
  Filled 2012-09-21 (×2): qty 1

## 2012-09-21 MED ORDER — LEVOTHYROXINE SODIUM 88 MCG PO TABS
88.0000 ug | ORAL_TABLET | Freq: Every day | ORAL | Status: DC
  Administered 2012-09-22 – 2012-09-23 (×2): 88 ug via ORAL
  Filled 2012-09-21 (×3): qty 1

## 2012-09-21 MED ORDER — SODIUM CHLORIDE 0.9 % IJ SOLN
3.0000 mL | Freq: Two times a day (BID) | INTRAMUSCULAR | Status: DC
  Administered 2012-09-21 – 2012-09-23 (×4): 3 mL via INTRAVENOUS

## 2012-09-21 MED ORDER — SODIUM CHLORIDE 0.9 % IV BOLUS (SEPSIS)
1000.0000 mL | Freq: Once | INTRAVENOUS | Status: AC
  Administered 2012-09-21: 1000 mL via INTRAVENOUS

## 2012-09-21 MED ORDER — PREDNISONE 20 MG PO TABS
60.0000 mg | ORAL_TABLET | Freq: Once | ORAL | Status: AC
  Administered 2012-09-21: 60 mg via ORAL
  Filled 2012-09-21: qty 3

## 2012-09-21 MED ORDER — INSULIN ASPART 100 UNIT/ML ~~LOC~~ SOLN
0.0000 [IU] | Freq: Three times a day (TID) | SUBCUTANEOUS | Status: DC
  Administered 2012-09-22: 7 [IU] via SUBCUTANEOUS
  Administered 2012-09-22: 2 [IU] via SUBCUTANEOUS

## 2012-09-21 MED ORDER — ONDANSETRON HCL 4 MG/2ML IJ SOLN
4.0000 mg | Freq: Once | INTRAMUSCULAR | Status: AC
  Administered 2012-09-21: 4 mg via INTRAVENOUS
  Filled 2012-09-21: qty 2

## 2012-09-21 MED ORDER — ONDANSETRON HCL 4 MG/2ML IJ SOLN: 4.0000 mg | Freq: Four times a day (QID) | INTRAMUSCULAR | Status: DC | PRN

## 2012-09-21 MED ORDER — SODIUM CHLORIDE 0.9 % IV SOLN
INTRAVENOUS | Status: DC
  Administered 2012-09-21: 15:00:00 via INTRAVENOUS

## 2012-09-21 MED ORDER — ACETAMINOPHEN 650 MG RE SUPP: 650.0000 mg | Freq: Four times a day (QID) | RECTAL | Status: DC | PRN

## 2012-09-21 MED ORDER — ONDANSETRON HCL 4 MG PO TABS: 4.0000 mg | ORAL_TABLET | Freq: Four times a day (QID) | ORAL | Status: DC | PRN

## 2012-09-21 MED ORDER — ACETAMINOPHEN 325 MG PO TABS: 650.0000 mg | ORAL_TABLET | Freq: Four times a day (QID) | ORAL | Status: DC | PRN

## 2012-09-21 MED ORDER — MORPHINE SULFATE 2 MG/ML IJ SOLN
1.0000 mg | INTRAMUSCULAR | Status: DC | PRN
  Administered 2012-09-21 – 2012-09-23 (×5): 1 mg via INTRAVENOUS
  Filled 2012-09-21 (×5): qty 1

## 2012-09-21 MED ORDER — LORAZEPAM 1 MG PO TABS
3.0000 mg | ORAL_TABLET | Freq: Every day | ORAL | Status: DC
  Administered 2012-09-21: 3 mg via ORAL
  Filled 2012-09-21: qty 3

## 2012-09-21 MED ORDER — DULOXETINE HCL 30 MG PO CPEP
30.0000 mg | ORAL_CAPSULE | Freq: Every day | ORAL | Status: DC
  Administered 2012-09-21 – 2012-09-23 (×3): 30 mg via ORAL
  Filled 2012-09-21 (×3): qty 1

## 2012-09-21 MED ORDER — ALBUTEROL (5 MG/ML) CONTINUOUS INHALATION SOLN
10.0000 mg/h | INHALATION_SOLUTION | Freq: Once | RESPIRATORY_TRACT | Status: AC
  Administered 2012-09-21: 10 mg/h via RESPIRATORY_TRACT
  Filled 2012-09-21: qty 20

## 2012-09-21 MED ORDER — ENOXAPARIN SODIUM 40 MG/0.4ML ~~LOC~~ SOLN
40.0000 mg | SUBCUTANEOUS | Status: DC
  Administered 2012-09-21 – 2012-09-22 (×2): 40 mg via SUBCUTANEOUS
  Filled 2012-09-21 (×3): qty 0.4

## 2012-09-21 MED ORDER — MORPHINE SULFATE 4 MG/ML IJ SOLN
4.0000 mg | Freq: Once | INTRAMUSCULAR | Status: AC
  Administered 2012-09-21: 4 mg via INTRAVENOUS
  Filled 2012-09-21: qty 1

## 2012-09-21 MED ORDER — POTASSIUM CHLORIDE 10 MEQ/100ML IV SOLN
10.0000 meq | INTRAVENOUS | Status: AC
  Administered 2012-09-21 – 2012-09-22 (×3): 10 meq via INTRAVENOUS
  Filled 2012-09-21 (×3): qty 100

## 2012-09-21 MED ORDER — SIMVASTATIN 10 MG PO TABS
10.0000 mg | ORAL_TABLET | Freq: Every day | ORAL | Status: DC
  Administered 2012-09-22: 10 mg via ORAL
  Filled 2012-09-21 (×2): qty 1

## 2012-09-21 MED ORDER — CLOPIDOGREL BISULFATE 75 MG PO TABS
75.0000 mg | ORAL_TABLET | Freq: Every morning | ORAL | Status: DC
  Administered 2012-09-22 – 2012-09-23 (×2): 75 mg via ORAL
  Filled 2012-09-21 (×2): qty 1

## 2012-09-21 MED ORDER — HYDROCODONE-ACETAMINOPHEN 10-325 MG PO TABS
1.0000 | ORAL_TABLET | Freq: Three times a day (TID) | ORAL | Status: DC | PRN
  Administered 2012-09-22 – 2012-09-23 (×2): 1 via ORAL
  Filled 2012-09-21 (×2): qty 1

## 2012-09-21 NOTE — ED Notes (Signed)
Admitting MD at bedside.

## 2012-09-21 NOTE — ED Provider Notes (Deleted)
CSN: [id number]     Arrival date & time 09/21/12  1228 History     None    No chief complaint on file.  (Consider location/radiation/quality/duration/timing/severity/associated sxs/prior Treatment) HPI Patient presents to the emergency department with withdrawal from opiate pain medication.  The patient, states she ran her pain medicine 3 days, ago.  Patient, states she needs a new primary doctor and pain management clinic.  Patient, states, that she's having total body pain and cramping.  Patient denies chest pain, vomiting, abdominal pain, headache, blurred vision, weakness, numbness, dizziness, fever, shortness of breath, or syncope.  Patient, states she has not taken any medications prior to arrival. Past Medical History  Diagnosis Date  . Hyperlipidemia   . Arthritis   . Hypothyroidism   . GERD (gastroesophageal reflux disease)   . Anxiety   . Diabetes mellitus age 53    insulin dependent  . Peripheral vascular disease   . Lumbar disc disease   . Insulin dependent type 2 diabetes mellitus, controlled   . GERD 07/20/2006  . CAD (coronary artery disease)   . CAD (coronary artery disease) of bypass graft   . Vertigo   . History of transient ischemic attack (TIA)   . Myocardial infarction     AGE 67    . Anginal pain   . Hypertension   . Depression   . COPD 12/14/2008    PATIENT DENIES    . Stroke     3 MINI STROKES  RT SIDED WEAKNESS  . Personal history of malignant neoplasm of kidney 12/14/2008    Laparoscopic biopsy and cryoablation 7/08 Dr. Normajean Baxter   . Headache   . Neuromuscular disorder     PERIPHERAL NEUROPATHY  . Anemia   . 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 Diabetic coma Feb. 2014   Past Surgical History  Procedure Laterality Date  . Appendectomy    . Abdominal hysterectomy    . Knee surgery    . Cholecystectomy    . Lung surgery      lung nodule removed from the right side  . Laparascopic cryoablation of left kidney  08/2006    Dr. Sherre Poot  for renal cell cancer  . Bladder suspension    . Shoulder surgery    . Foot surgery    . Pr vein bypass graft,aorto-fem-pop  05/27/2010  . Cardiac catheterization    . Femoral-popliteal bypass graft  10/12/2011    Procedure: BYPASS GRAFT FEMORAL-POPLITEAL ARTERY;  Surgeon: Sherren Kerns, MD;  Location: [facility] OR;  Service: Vascular;  Laterality: Left;  Left Femoral-Popliteal Bypass Graft using 6mm x 80cm Propaten Graft with intraop arteriogram times one.  . Intraoperative arteriogram  10/12/2011    Procedure: INTRA OPERATIVE ARTERIOGRAM;  Surgeon: Sherren Kerns, MD;  Location: [facility] OR;  Service: Vascular;  Laterality: Left;   Family History  Problem Relation Age of Onset  . Heart disease Mother   . Heart disease Father   . Heart disease Sister   . Lung cancer Mother   . Lung cancer Father    History  Substance Use Topics  . Smoking status: Former Smoker -- 50 years    Types: Cigarettes    Quit date: 07/12/2011  . Smokeless tobacco: Never Used  . Alcohol Use: No   OB History   Grav Para Term Preterm Abortions TAB SAB Ect Mult Living  Review of Systems All other systems negative except as documented in the HPI. All pertinent positives and negatives as reviewed in the HPI. Allergies  Niacin-simvastatin er  Home Medications   Current Outpatient Rx  Name  Route  Sig  Dispense  Refill  . clopidogrel (PLAVIX) 75 MG tablet   Oral   Take 75 mg by mouth every morning.         . DULoxetine (CYMBALTA) 30 MG capsule   Oral   Take 1 capsule (30 mg total) by mouth daily.   30 capsule   0   . feeding supplement (ENSURE COMPLETE) LIQD   Oral   Take 237 mLs by mouth 2 (two) times daily between meals.         . lansoprazole (PREVACID) 30 MG capsule   Oral   Take 30 mg by mouth every morning.         Marland Kitchen levothyroxine (SYNTHROID, LEVOTHROID) 88 MCG tablet   Oral   Take 88 mcg by mouth daily before breakfast.         . metFORMIN (GLUCOPHAGE) 850 MG tablet    Oral   Take 850 mg by mouth 2 (two) times daily with a meal.         . metoprolol tartrate (LOPRESSOR) 25 MG tablet   Oral   Take 25 mg by mouth 2 (two) times daily.         . pravastatin (PRAVACHOL) 20 MG tablet   Oral   Take 20 mg by mouth daily.         Marland Kitchen tolterodine (DETROL LA) 4 MG 24 hr capsule   Oral   Take 4 mg by mouth every morning.          There were no vitals taken for this visit. Physical Exam  Nursing note and vitals reviewed. Constitutional: She is oriented to person, place, and time. She appears well-developed and well-nourished. No distress.  HENT:  Head: Normocephalic and atraumatic.  Mouth/Throat: Oropharynx is clear and moist.  Eyes: Pupils are equal, round, and reactive to light.  Neck: Normal range of motion. Neck supple.  Cardiovascular: Normal rate, regular rhythm and normal heart sounds.  Exam reveals no gallop and no friction rub.   No murmur heard. Pulmonary/Chest: Effort normal and breath sounds normal.  Neurological: She is alert and oriented to person, place, and time. She exhibits normal muscle tone. Coordination normal.  Skin: Skin is warm and dry. No erythema.  Psychiatric: She has a normal mood and affect. Her behavior is normal. Judgment and thought content normal.    ED Course   Procedures (including critical care time)  Patient presents for withdrawal from pain medication, but does not want any help detoxing.  Advised patient and give her a limited quantity, medications.  She'll need to followup with her primary care Dr. for further evaluation.  Told to return here as needed.  Advised to increase her fluid intake, as well.  Patient is not actively vomiting.  Patient's vital signs have been stable as well  MDM    Carlyle Dolly, PA-C 09/21/12 1237

## 2012-09-21 NOTE — ED Notes (Signed)
Respiratory called for breathing tx

## 2012-09-21 NOTE — ED Provider Notes (Signed)
CSN: [id number]     Arrival date & time 09/21/12  1228 History     First MD Initiated Contact with Patient 09/21/12 1242     Chief Complaint  Patient presents with  . Chest Pain  . Shortness of Breath   (Consider location/radiation/quality/duration/timing/severity/associated sxs/prior Treatment) HPI Comments: April Branch is a 67 y.o. Female presents for evaluation by private vehicle, for dehydration, chest pain, and dizziness. She is currently being treated with antibiotic for "stomach infection. She has had diarrhea for 1 month. She saw her PCP, today, and was given a prescription for Flagyl 500 mg 3 times a day. She felt worse, so came here for evaluation. She denies vomiting. She has had chest pain, for 3 days. There has been intermittent, then became constant at 8 PM this morning. Currently, the chest pain is moderate. She feels dehydrated. She has had a poor appetite for several weeks. She is somewhat confused about her current medications. She has a prescription for Septra written 5 days ago, #20, with an empty pill bottle. She is unsure if she took all the meds. There no other known modifying factors  Patient is a 67 y.o. female presenting with chest pain and shortness of breath. The history is provided by the patient.  Chest Pain Associated symptoms: shortness of breath   Shortness of Breath Associated symptoms: chest pain     Past Medical History  Diagnosis Date  . Hyperlipidemia   . Arthritis   . Hypothyroidism   . GERD (gastroesophageal reflux disease)   . Anxiety   . Diabetes mellitus age 36    insulin dependent  . Peripheral vascular disease   . Lumbar disc disease   . Insulin dependent type 2 diabetes mellitus, controlled   . GERD 07/20/2006  . CAD (coronary artery disease)   . CAD (coronary artery disease) of bypass graft   . Vertigo   . History of transient ischemic attack (TIA)   . Myocardial infarction     AGE 28    . Anginal pain   . Hypertension    . Depression   . COPD 12/14/2008    PATIENT DENIES    . Stroke     3 MINI STROKES  RT SIDED WEAKNESS  . Personal history of malignant neoplasm of kidney(V10.52) 12/14/2008    Laparoscopic biopsy and cryoablation 7/08 Dr. Normajean Baxter   . Headache(784.0)   . Neuromuscular disorder     PERIPHERAL NEUROPATHY  . Anemia   . 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 Diabetic coma Feb. 2014   Past Surgical History  Procedure Laterality Date  . Appendectomy    . Abdominal hysterectomy    . Knee surgery    . Cholecystectomy    . Lung surgery      lung nodule removed from the right side  . Laparascopic cryoablation of left kidney  08/2006    Dr. Sherre Poot for renal cell cancer  . Bladder suspension    . Shoulder surgery    . Foot surgery    . Pr vein bypass graft,aorto-fem-pop  05/27/2010  . Cardiac catheterization    . Femoral-popliteal bypass graft  10/12/2011    Procedure: BYPASS GRAFT FEMORAL-POPLITEAL ARTERY;  Surgeon: Sherren Kerns, MD;  Location: [facility] OR;  Service: Vascular;  Laterality: Left;  Left Femoral-Popliteal Bypass Graft using 6mm x 80cm Propaten Graft with intraop arteriogram times one.  . Intraoperative arteriogram  10/12/2011    Procedure: INTRA  OPERATIVE ARTERIOGRAM;  Surgeon: Sherren Kerns, MD;  Location: [facility] OR;  Service: Vascular;  Laterality: Left;   Family History  Problem Relation Age of Onset  . Heart disease Mother   . Heart disease Father   . Heart disease Sister   . Lung cancer Mother   . Lung cancer Father    History  Substance Use Topics  . Smoking status: Former Smoker -- 50 years    Types: Cigarettes    Quit date: 07/12/2011  . Smokeless tobacco: Never Used  . Alcohol Use: No   OB History   Grav Para Term Preterm Abortions TAB SAB Ect Mult Living                 Review of Systems  Respiratory: Positive for shortness of breath.   Cardiovascular: Positive for chest pain.  All other systems reviewed and are negative.    Allergies   Niacin-simvastatin er  Home Medications   Current Outpatient Rx  Name  Route  Sig  Dispense  Refill  . amitriptyline (ELAVIL) 75 MG tablet   Oral   Take 75 mg by mouth at bedtime.         . clopidogrel (PLAVIX) 75 MG tablet   Oral   Take 75 mg by mouth every morning.         . DULoxetine (CYMBALTA) 30 MG capsule   Oral   Take 1 capsule (30 mg total) by mouth daily.   30 capsule   0   . feeding supplement (ENSURE COMPLETE) LIQD   Oral   Take 237 mLs by mouth 2 (two) times daily between meals.         Marland Kitchen HYDROcodone-acetaminophen (NORCO) 10-325 MG per tablet   Oral   Take 1 tablet by mouth 3 (three) times daily as needed for pain.         Marland Kitchen insulin aspart (NOVOLOG) 100 UNIT/ML injection   Subcutaneous   Inject 5-10 Units into the skin 3 (three) times daily with meals. Per sliding scale         . lansoprazole (PREVACID) 30 MG capsule   Oral   Take 30 mg by mouth every morning.         Marland Kitchen levothyroxine (SYNTHROID, LEVOTHROID) 88 MCG tablet   Oral   Take 88 mcg by mouth daily before breakfast.         . loperamide (IMODIUM A-D) 2 MG tablet   Oral   Take 4 mg by mouth daily as needed for diarrhea or loose stools.         Marland Kitchen LORazepam (ATIVAN) 1 MG tablet   Oral   Take 3 mg by mouth at bedtime.         . metFORMIN (GLUCOPHAGE) 850 MG tablet   Oral   Take 850 mg by mouth 2 (two) times daily with a meal.         . metroNIDAZOLE (FLAGYL) 500 MG tablet   Oral   Take 500 mg by mouth 3 (three) times daily.         . pravastatin (PRAVACHOL) 20 MG tablet   Oral   Take 20 mg by mouth daily.         . promethazine (PHENERGAN) 25 MG suppository   Rectal   Place 25 mg rectally every 6 (six) hours as needed for nausea.         Marland Kitchen sulfamethoxazole-trimethoprim (BACTRIM DS) 800-160 MG per tablet   Oral  Take 1 tablet by mouth 2 (two) times daily.         Marland Kitchen tolterodine (DETROL LA) 4 MG 24 hr capsule   Oral   Take 4 mg by mouth every morning.           BP 115/91  Pulse 40  Temp(Src) 98.2 F (36.8 C) (Oral)  Resp 19  SpO2 100% Physical Exam  Nursing note and vitals reviewed. Constitutional: She is oriented to person, place, and time. She appears well-developed. No distress.  Frail, appears older than stated age  HENT:  Head: Normocephalic and atraumatic.  Mouth/Throat: Oropharynx is clear and moist. No oropharyngeal exudate.  Eyes: Conjunctivae and EOM are normal. Pupils are equal, round, and reactive to light.  Neck: Normal range of motion and phonation normal. Neck supple.  Cardiovascular: Normal rate, regular rhythm and intact distal pulses.   Pulmonary/Chest: Effort normal and breath sounds normal. She exhibits no tenderness.  Abdominal: Soft. She exhibits no distension and no mass. There is no tenderness (mild diffuse). There is no rebound and no guarding.  Musculoskeletal: Normal range of motion.  Neurological: She is alert and oriented to person, place, and time. She has normal strength. She exhibits normal muscle tone.  Skin: Skin is warm and dry.  Psychiatric: She has a normal mood and affect. Her behavior is normal. Judgment and thought content normal.    ED Course   Procedures (including critical care time)  Repeat blood pressure, was normal, then dropped to 91/71, then improved to 107/56; IV fluids, ordered  Medications  0.9 %  sodium chloride infusion ( Intravenous New Bag/Given 09/21/12 1516)  sodium chloride 0.9 % bolus 1,000 mL (0 mLs Intravenous Stopped 09/21/12 1514)  morphine 4 MG/ML injection 4 mg (4 mg Intravenous Given 09/21/12 1400)  ondansetron (ZOFRAN) injection 4 mg (4 mg Intravenous Given 09/21/12 1400)  potassium chloride SA (K-DUR,KLOR-CON) CR tablet 40 mEq (40 mEq Oral Given 09/21/12 1504)  morphine 4 MG/ML injection 4 mg (4 mg Intravenous Given 09/21/12 1512)  albuterol (PROVENTIL,VENTOLIN) solution continuous neb (10 mg/hr Nebulization Given 09/21/12 1734)  predniSONE (DELTASONE) tablet 60  mg (60 mg Oral Given 09/21/12 1715)  potassium chloride SA (K-DUR,KLOR-CON) CR tablet 40 mEq (40 mEq Oral Given 09/21/12 1715)    Patient Vitals for the past 24 hrs:  BP Temp Temp src Pulse Resp SpO2  09/21/12 1737 - - - - - 100 %  09/21/12 1715 115/91 mmHg - - - - -  09/21/12 1700 117/59 mmHg - - 40 19 -  09/21/12 1630 113/55 mmHg - - 74 16 100 %  09/21/12 1545 110/56 mmHg - - 73 18 100 %  09/21/12 1532 - 98.2 F (36.8 C) Oral - - -  09/21/12 1515 104/86 mmHg - - 75 - 100 %  09/21/12 1345 107/56 mmHg - - 78 15 96 %  09/21/12 1315 108/60 mmHg - - 77 14 96 %  09/21/12 1245 125/91 mmHg 98.9 F (37.2 C) Oral 84 18 98 %     Date: 09/21/12  Rate: 84  Rhythm: normal sinus rhythm  QRS Axis: right  PR and QT Intervals: normal  ST/T Wave abnormalities: nonspecific T wave changes  PR and QRS Conduction Disutrbances:none  Narrative Interpretation:   Old EKG Reviewed: unchanged- since 03/12/12  6:55 PM Reevaluation with update and discussion. After initial assessment and treatment, an updated evaluation reveals she has been hungry, and is now tolerating food and fluid, or leg. She continues to complain of  epigastric discomfort, nausea, and chest pain, radiating to her neck. She requests another injection for pain. Despite having frequent episodes of diarrhea, she has not yet had a stool in the emergency department. Geraldene Eisel L    Labs Reviewed  PRO B NATRIURETIC PEPTIDE - Abnormal; Notable for the following:    Pro B Natriuretic peptide (BNP) 330.8 (*)    All other components within normal limits  CBC WITH DIFFERENTIAL - Abnormal; Notable for the following:    Hemoglobin 11.0 (*)    HCT 32.0 (*)    MCV 76.6 (*)    RDW 15.8 (*)    Eosinophils Relative 6 (*)    All other components within normal limits  BASIC METABOLIC PANEL - Abnormal; Notable for the following:    Sodium 133 (*)    Potassium 2.9 (*)    Glucose, Bld 150 (*)    BUN 32 (*)    Creatinine, Ser 2.01 (*)    GFR calc  non Af Amer 25 (*)    GFR calc Af Amer 29 (*)    All other components within normal limits  URINALYSIS, ROUTINE W REFLEX MICROSCOPIC - Abnormal; Notable for the following:    APPearance CLOUDY (*)    Hgb urine dipstick TRACE (*)    Leukocytes, UA TRACE (*)    All other components within normal limits  URINE MICROSCOPIC-ADD ON - Abnormal; Notable for the following:    Squamous Epithelial / LPF FEW (*)    All other components within normal limits  URINE CULTURE  STOOL CULTURE  OVA AND PARASITE EXAMINATION  CLOSTRIDIUM DIFFICILE BY PCR  TROPONIN I  CBC  POCT I-STAT TROPONIN I   Dg Chest 2 View  09/21/2012   *RADIOLOGY REPORT*  Clinical Data: Chest pain, shortness of breath  CHEST - 2 VIEW  Comparison: March 15, 2012.  Findings: Cardiomediastinal silhouette appears normal.  No acute pulmonary disease is noted.  Bony thorax is intact.  Stable right perihilar scarring is noted.  IMPRESSION: No acute cardiopulmonary abnormality seen.   Original Report Authenticated By: Lupita Raider.,  M.D.   1. Dehydration   2. Diarrhea   3. Chest pain   4. Acute kidney injury (nontraumatic)     MDM  Diarrhea with dehydration, and significant change in GFR,  decrease GFR about 50%. She has hypokalemia, likely secondary to diarrhea. Doubt ACS, PE, or pneumonia.   Nursing Notes Reviewed/ Care Coordinated, and agree without changes. Applicable Imaging Reviewed.  Interpretation of Laboratory Data incorporated into ED treatment  Plan: Admit     Flint Melter, MD 09/21/12 1943

## 2012-09-21 NOTE — ED Notes (Signed)
Pt. Stated, I've had chest pain for the last 3 days off and on.  I'm SOB and feeling dizzy.  I've been taking an antibiotic for a stomach infection.

## 2012-09-22 ENCOUNTER — Inpatient Hospital Stay (HOSPITAL_COMMUNITY): Payer: Medicare HMO

## 2012-09-22 ENCOUNTER — Encounter (HOSPITAL_COMMUNITY): Payer: Self-pay | Admitting: Radiology

## 2012-09-22 LAB — URINE CULTURE: Colony Count: 40000

## 2012-09-22 LAB — COMPREHENSIVE METABOLIC PANEL
ALT: 7 U/L (ref 0–35)
AST: 8 U/L (ref 0–37)
Calcium: 8.2 mg/dL — ABNORMAL LOW (ref 8.4–10.5)
Creatinine, Ser: 1.25 mg/dL — ABNORMAL HIGH (ref 0.50–1.10)
GFR calc Af Amer: 51 mL/min — ABNORMAL LOW (ref >90)
Sodium: 137 mEq/L (ref 135–145)
Total Protein: 6 g/dL (ref 6.0–8.3)

## 2012-09-22 LAB — LACTIC ACID, PLASMA: Lactic Acid, Venous: 2.8 mmol/L — ABNORMAL HIGH (ref 0.5–2.2)

## 2012-09-22 LAB — GLUCOSE, CAPILLARY
Glucose-Capillary: 318 mg/dL — ABNORMAL HIGH (ref 70–99)
Glucose-Capillary: 193 mg/dL — ABNORMAL HIGH (ref 70–99)

## 2012-09-22 LAB — CBC WITH DIFFERENTIAL/PLATELET
Basophils Relative: 1 % (ref 0–1)
HCT: 27.6 % — ABNORMAL LOW (ref 36.0–46.0)
Hemoglobin: 9.2 g/dL — ABNORMAL LOW (ref 12.0–15.0)
Lymphs Abs: 0.7 10*3/uL (ref 0.7–4.0)
MCH: 26.1 pg (ref 26.0–34.0)
MCHC: 33.3 g/dL (ref 30.0–36.0)
Monocytes Absolute: 0.2 10*3/uL (ref 0.1–1.0)
Monocytes Relative: 3 % (ref 3–12)
Neutro Abs: 5.2 10*3/uL (ref 1.7–7.7)
Neutrophils Relative %: 85 % — ABNORMAL HIGH (ref 43–77)
RBC: 3.53 MIL/uL — ABNORMAL LOW (ref 3.87–5.11)

## 2012-09-22 LAB — RAPID URINE DRUG SCREEN, HOSP PERFORMED
Amphetamines: NOT DETECTED
Barbiturates: NOT DETECTED
Benzodiazepines: NOT DETECTED
Cocaine: NOT DETECTED
Tetrahydrocannabinol: NOT DETECTED

## 2012-09-22 LAB — TSH: TSH: 1.21 u[IU]/mL (ref 0.350–4.500)

## 2012-09-22 LAB — MAGNESIUM: Magnesium: 1.8 mg/dL (ref 1.5–2.5)

## 2012-09-22 LAB — TROPONIN I: Troponin I: <0.3 ng/mL (ref <0.30)

## 2012-09-22 MED ORDER — INSULIN ASPART 100 UNIT/ML ~~LOC~~ SOLN: 0.0000 [IU] | Freq: Every day | SUBCUTANEOUS | Status: DC

## 2012-09-22 MED ORDER — MAGNESIUM SULFATE IN D5W 10-5 MG/ML-% IV SOLN
1.0000 g | Freq: Once | INTRAVENOUS | Status: AC
  Administered 2012-09-22: 1 g via INTRAVENOUS
  Filled 2012-09-22: qty 100

## 2012-09-22 MED ORDER — INSULIN ASPART 100 UNIT/ML ~~LOC~~ SOLN
0.0000 [IU] | Freq: Three times a day (TID) | SUBCUTANEOUS | Status: DC
  Administered 2012-09-22: 8 [IU] via SUBCUTANEOUS
  Administered 2012-09-23: 3 [IU] via SUBCUTANEOUS

## 2012-09-22 MED ORDER — INSULIN GLARGINE 100 UNIT/ML ~~LOC~~ SOLN
20.0000 [IU] | Freq: Every day | SUBCUTANEOUS | Status: DC
  Administered 2012-09-22 – 2012-09-23 (×2): 20 [IU] via SUBCUTANEOUS
  Filled 2012-09-22 (×2): qty 0.2

## 2012-09-22 MED ORDER — IOHEXOL 300 MG/ML  SOLN
25.0000 mL | INTRAMUSCULAR | Status: AC
  Administered 2012-09-22 (×2): 25 mL via ORAL

## 2012-09-22 MED ORDER — SODIUM CHLORIDE 0.9 % IV SOLN
INTRAVENOUS | Status: DC
  Administered 2012-09-22 (×2): via INTRAVENOUS

## 2012-09-22 MED ORDER — LORAZEPAM 1 MG PO TABS
1.0000 mg | ORAL_TABLET | Freq: Every day | ORAL | Status: DC
  Administered 2012-09-22: 1 mg via ORAL
  Filled 2012-09-22: qty 1

## 2012-09-22 NOTE — Progress Notes (Signed)
Utilization review completed. Adonna Horsley, RN, BSN. 

## 2012-09-22 NOTE — Progress Notes (Signed)
Inpatient Diabetes Program Recommendations  AACE/ADA: New Consensus Statement on Inpatient Glycemic Control (2013)  Target Ranges:  Prepandial:   less than 140 mg/dL      Peak postprandial:   less than 180 mg/dL (1-2 hours)      Critically ill patients:  140 - 180 mg/dL   Reason for Visit: Results for NANDIKA, STETZER (MRN [id number]) as of 09/22/2012 12:15  Ref. Range 09/21/2012 21:16 09/22/2012 05:56 09/22/2012 12:02  Glucose-Capillary Latest Range: 70-99 mg/dL 478 (H) 295 (H) 621 (H)   CBG's elevated. Patient received Prednisone 60 mg x 1 on 09/21/12 which likely contributed to elevated CBG's today.  Lantus 20 units started today and note plans to discontinue metformin.  Please check A1C to determine pre-hospitalization glycemic control.  Titrate Lantus according to fasting CBG's.

## 2012-09-22 NOTE — H&P (Addendum)
[name] History and Physical  April Branch ZOX:[id number] DOB: 08/17/1945 DOA: 09/21/2012  Referring physician: ER physician. PCP: Quitman Livings, MD   Chief Complaint: Weakness and chest pain.  HPI: April Branch is a 67 y.o. female with history of diabetes mellitus type 2, hypothyroidism, chronic pain, COPD presented to the ER because of weakness and has had also chest pain. Patient has been having diarrhea with some nausea and vomiting over the last one month. Patient has been taking antibiotics for last 3 weeks and had gone to her PCP today and was prescribed Flagyl in addition to her early antibiotics. After patient went home she felt weak not started developing some chest pressure. Chest pressure was nonradiating retrosternal denies any associated shortness of breath and denies any fever chills. Patient states that she's been having multiple episodes of diarrhea until yesterday. In the ER patient was found to have EKG showing diffuse T-wave changes and cardiac markers were negative. Labs show acute renal failure with elevated lactic acid levels. Patient has been admitted for further management. Patient presently is chest pain-free and is not in acute distress. Patient presently has no abdominal pain but states that she gets crampy abdominal discomfort in the lower abdomen.  Review of Systems: As presented in the history of presenting illness, rest negative.  Past Medical History  Diagnosis Date  . Hyperlipidemia   . Arthritis   . Hypothyroidism   . GERD (gastroesophageal reflux disease)   . Anxiety   . Diabetes mellitus age 75    insulin dependent  . Peripheral vascular disease   . Lumbar disc disease   . Insulin dependent type 2 diabetes mellitus, controlled   . GERD 07/20/2006  . CAD (coronary artery disease)   . CAD (coronary artery disease) of bypass graft   . Vertigo   . History of transient ischemic attack (TIA)   . Myocardial infarction     AGE 28    .  Anginal pain   . Hypertension   . Depression   . COPD 12/14/2008    PATIENT DENIES    . Stroke     3 MINI STROKES  RT SIDED WEAKNESS  . Personal history of malignant neoplasm of kidney(V10.52) 12/14/2008    Laparoscopic biopsy and cryoablation 7/08 Dr. Normajean Baxter   . Headache(784.0)   . Neuromuscular disorder     PERIPHERAL NEUROPATHY  . Anemia   . 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 Diabetic coma Feb. 2014   Past Surgical History  Procedure Laterality Date  . Appendectomy    . Abdominal hysterectomy    . Knee surgery    . Cholecystectomy    . Lung surgery      lung nodule removed from the right side  . Laparascopic cryoablation of left kidney  08/2006    Dr. Sherre Poot for renal cell cancer  . Bladder suspension    . Shoulder surgery    . Foot surgery    . Pr vein bypass graft,aorto-fem-pop  05/27/2010  . Cardiac catheterization    . Femoral-popliteal bypass graft  10/12/2011    Procedure: BYPASS GRAFT FEMORAL-POPLITEAL ARTERY;  Surgeon: Sherren Kerns, MD;  Location: [facility] OR;  Service: Vascular;  Laterality: Left;  Left Femoral-Popliteal Bypass Graft using 6mm x 80cm Propaten Graft with intraop arteriogram times one.  . Intraoperative arteriogram  10/12/2011    Procedure: INTRA OPERATIVE ARTERIOGRAM;  Surgeon: Sherren Kerns, MD;  Location: [facility] OR;  Service:  Vascular;  Laterality: Left;   Social History:  reports that she quit smoking about 14 months ago. Her smoking use included Cigarettes. She smoked 0.00 packs per day for 50 years. She has never used smokeless tobacco. She reports that she does not drink alcohol or use illicit drugs.  home.  where does patient live--  can do ADLs.  Can patient participate in ADLs?  Allergies  Allergen Reactions  . Niacin-Simvastatin Er Hives    Family History  Problem Relation Age of Onset  . Heart disease Mother   . Heart disease Father   . Heart disease Sister   . Lung cancer Mother   . Lung cancer Father       Prior to  Admission medications   Medication Sig Start Date End Date Taking? Authorizing Provider  amitriptyline (ELAVIL) 75 MG tablet Take 75 mg by mouth at bedtime.   Yes Historical Provider, MD  clopidogrel (PLAVIX) 75 MG tablet Take 75 mg by mouth every morning.   Yes Historical Provider, MD  DULoxetine (CYMBALTA) 30 MG capsule Take 1 capsule (30 mg total) by mouth daily. 03/18/12  Yes Adeline Joselyn Glassman, MD  feeding supplement (ENSURE COMPLETE) LIQD Take 237 mLs by mouth 2 (two) times daily between meals. 03/18/12  Yes Adeline Joselyn Glassman, MD  HYDROcodone-acetaminophen (NORCO) 10-325 MG per tablet Take 1 tablet by mouth 3 (three) times daily as needed for pain.   Yes Historical Provider, MD  insulin aspart (NOVOLOG) 100 UNIT/ML injection Inject 5-10 Units into the skin 3 (three) times daily with meals. Per sliding scale   Yes Historical Provider, MD  lansoprazole (PREVACID) 30 MG capsule Take 30 mg by mouth every morning.   Yes Historical Provider, MD  levothyroxine (SYNTHROID, LEVOTHROID) 88 MCG tablet Take 88 mcg by mouth daily before breakfast.   Yes Historical Provider, MD  loperamide (IMODIUM A-D) 2 MG tablet Take 4 mg by mouth daily as needed for diarrhea or loose stools.   Yes Historical Provider, MD  LORazepam (ATIVAN) 1 MG tablet Take 3 mg by mouth at bedtime.   Yes Historical Provider, MD  metFORMIN (GLUCOPHAGE) 850 MG tablet Take 850 mg by mouth 2 (two) times daily with a meal.   Yes Historical Provider, MD  metroNIDAZOLE (FLAGYL) 500 MG tablet Take 500 mg by mouth 3 (three) times daily.   Yes Historical Provider, MD  pravastatin (PRAVACHOL) 20 MG tablet Take 20 mg by mouth daily.   Yes Historical Provider, MD  promethazine (PHENERGAN) 25 MG suppository Place 25 mg rectally every 6 (six) hours as needed for nausea.   Yes Historical Provider, MD  sulfamethoxazole-trimethoprim (BACTRIM DS) 800-160 MG per tablet Take 1 tablet by mouth 2 (two) times daily. 09/16/12 09/25/12 Yes Historical Provider, MD   tolterodine (DETROL LA) 4 MG 24 hr capsule Take 4 mg by mouth every morning.   Yes Historical Provider, MD   Physical Exam: Filed Vitals:   09/21/12 1715 09/21/12 1737 09/21/12 2102 09/22/12 0120  BP: 115/91  104/48 108/53  Pulse:   83 83  Temp:   98.5 F (36.9 C) 98.1 F (36.7 C)  TempSrc:   Oral Oral  Resp:   20 16  Weight:   68.6 kg (151 lb 3.8 oz)   SpO2:  100% 94% 96%     General:  Well-developed and nourished.   Eyes: Anicteric no pallor.   ENT:  no discharge from the ears eyes nose mouth.   Neck: No mass felt.  Cardiovascular: S1-S2  heard.   Respiratory:  no rhonchi or crepitations.  Abdomen:  soft nontender bowel sounds present.   Skin:  No rash.  Musculoskeletal: No edema.   Psychiatric: Appears normal.   Neurologic:  alert awake oriented to time place and person. Moves all extremities.  Labs on Admission:  Basic Metabolic Panel:  Recent Labs Lab 09/21/12 1347 09/21/12 2225  NA 133*  --   K 2.9*  --   CL 96  --   CO2 20  --   GLUCOSE 150*  --   BUN 32*  --   CREATININE 2.01*  --   CALCIUM 9.3  --   MG  --  1.4*   Liver Function Tests:  Recent Labs Lab 09/21/12 2012  AST 10  ALT 7  ALKPHOS 86  BILITOT 0.1*  PROT 6.8  ALBUMIN 3.3*    Recent Labs Lab 09/21/12 2012  LIPASE 23   No results found for this basename: AMMONIA,  in the last 168 hours CBC:  Recent Labs Lab 09/21/12 1347  WBC 8.2  NEUTROABS 5.3  HGB 11.0*  HCT 32.0*  MCV 76.6*  PLT 326   Cardiac Enzymes:  Recent Labs Lab 09/21/12 1347 09/21/12 2225  TROPONINI <0.30 <0.30    BNP (last 3 results)  Recent Labs  09/21/12 1330  PROBNP 330.8*   CBG:  Recent Labs Lab 09/21/12 2116  GLUCAP 425*    Radiological Exams on Admission: Dg Chest 2 View  09/21/2012   *RADIOLOGY REPORT*  Clinical Data: Chest pain, shortness of breath  CHEST - 2 VIEW  Comparison: March 15, 2012.  Findings: Cardiomediastinal silhouette appears normal.  No acute pulmonary  disease is noted.  Bony thorax is intact.  Stable right perihilar scarring is noted.  IMPRESSION: No acute cardiopulmonary abnormality seen.   Original Report Authenticated By: Lupita Raider.,  M.D.    EKG: Independently reviewed.  normal sinus rhythm with diffuse nonspecific T-wave changes.   Assessment/Plan Principal Problem:   Chest pain Active Problems:   Hypothyroidism   Insulin dependent type 2 diabetes mellitus, controlled   Nausea vomiting and diarrhea   Acute kidney injury (nontraumatic)   1. Acute renal failure most likely from dehydration - patient has been placed on IV fluids. Closely follow intake output and metabolic panel. CT abdomen has been ordered to check for patient's chronic diarrhea and abdominal discomfort at the same for any hydronephrosis. 2. Lactic acidosis - from dehydration. Continue with hydration and recheck lactic acid levels. Patient does not look septic. Check CT to rule out any colitis. 3. Diarrhea with nausea vomiting and abdominal discomfort  - presently we will hold off antibiotics. Check stool studies and CT abdomen and hydrate. Advance diet as tolerated. 4. Chest pain - cycle cardiac markers. Check d-dimer. Patient has had a cardiac catheter last year which only showed nonobstructive disease. 5. Diabetes mellitus type 2 - closely follow CBGs. 6. COPD - presently not wheezing. Continue inhalers. 7. Hypokalemia - probably from diarrhea and dehydration. Replace and recheck. Check magnesium levels. 8. Hypothyroidism - continue Synthroid. 9. History of chronic pain. 10. Peripheral vascular disease.    Code Status:  full code.   Family Communication:  none.  Disposition Plan:  admit to inpatient.     Collette Pescador N. [name] Pager [phone].  If 7PM-7AM, please contact night-coverage [email or web address] Password [facility] 09/22/2012, 3:17 AM

## 2012-09-22 NOTE — Progress Notes (Signed)
[name]                                                                                Patient Demographics  April Branch, is a 67 y.o. female, DOB - 13-Jun-1945, ZOX:[id number], WJX:[id number]  Admit date - 09/21/2012  Admitting Physician Eduard Clos, MD  Outpatient Primary MD for the patient is Quitman Livings, MD  LOS - 1   Chief Complaint  Patient presents with  . Chest Pain  . Shortness of Breath        Assessment & Plan    1. Likely gastroenteritis versus C. difficile colitis induced diarrhea causing dehydration, acute renal failure, hyperkalemia and lactic acidosis. She is much improved with IV fluids, diarrhea also is improved, C. difficile PCR is pending, continue supportive care and IV fluids. Monitor BMP including bicarbonate levels, CT abdomen pelvis is unremarkable. Will hold Glucophage upon discharge as it might be contributing to diarrhea and lactic acidosis.   2. Nonspecific transient chest pain in the ER completely resolved. Nonacute EKG, troponin and negative, she is chest pain-free. Outpatient followup with PCP for his factor modulation.   3. Hypo-thyroid is unstable on home dose Synthroid.   4. Diabetes mellitus type 2. Currently on sliding scale insulin, home dose Glucophage held. We'll discontinue upon discharge as could be contributing to lactic acidosis and diarrhea. We'll add Lantus. And increase her scale.  CBG (last 3)   Recent Labs  09/21/12 2116 09/22/12 0556  GLUCAP 425* 318*   Lab Results  Component Value Date   HGBA1C 7.8* 05/09/2011       5. Renal failure due to #1 above improved with IV fluids monitor.   Code Status: Full  Family Communication: None Present  Disposition Plan: Home   Procedures CT abdomen pelvis   Consults    DVT Prophylaxis  Lovenox   Lab Results  Component Value Date   PLT 270 09/22/2012    Medications  Scheduled Meds: . amitriptyline  75 mg Oral QHS  . clopidogrel  75 mg  Oral q morning - 10a  . DULoxetine  30 mg Oral Daily  . enoxaparin (LOVENOX) injection  40 mg Subcutaneous Q24H  . fesoterodine  4 mg Oral Daily  . insulin aspart  0-9 Units Subcutaneous TID WC  . levothyroxine  88 mcg Oral QAC breakfast  . LORazepam  1 mg Oral QHS  . pantoprazole  40 mg Oral Daily  . simvastatin  10 mg Oral q1800  . sodium chloride  3 mL Intravenous Q12H   Continuous Infusions: . sodium chloride     PRN Meds:.acetaminophen, acetaminophen, HYDROcodone-acetaminophen, morphine injection, ondansetron (ZOFRAN) IV, ondansetron  Antibiotics     Anti-infectives   None       Time Spent in minutes   35   SINGH,PRASHANT K M.D on 09/22/2012 at 11:48 AM  Between 7am to 7pm - Pager - [phone]  After 7pm go to [email or web address] - password [email or web address]  And look for the night coverage person covering for me after hours  [facility] Office  [phone]    Subjective:   April Branch today has, No headache, No chest pain, No abdominal pain - No Nausea,  No new weakness tingling or numbness, No Cough - SOB. Diarrhea much better and she feels overall much improved.  Objective:   Filed Vitals:   09/21/12 1737 09/21/12 2102 09/22/12 0120 09/22/12 0532  BP:  104/48 108/53 122/69  Pulse:  83 83 75  Temp:  98.5 F (36.9 C) 98.1 F (36.7 C)   TempSrc:  Oral Oral Oral  Resp:  20 16 20   Weight:  68.6 kg (151 lb 3.8 oz)  68.3 kg (150 lb 9.2 oz)  SpO2: 100% 94% 96% 95%    Wt Readings from Last 3 Encounters:  09/22/12 68.3 kg (150 lb 9.2 oz)  03/15/12 64.275 kg (141 lb 11.2 oz)  03/12/12 65.318 kg (144 lb)     Intake/Output Summary (Last 24 hours) at 09/22/12 1148 Last data filed at 09/22/12 0845  Gross per 24 hour  Intake    360 ml  Output    825 ml  Net   -465 ml    Exam Awake Alert, Oriented X 3, No new F.N deficits, Normal affect [place].AT,PERRAL Supple Neck,No JVD, No cervical lymphadenopathy appriciated.  Symmetrical Chest wall movement, Good  air movement bilaterally, CTAB RRR,No Gallops,Rubs or new Murmurs, No Parasternal Heave +ve B.Sounds, Abd Soft, Non tender, No organomegaly appriciated, No rebound - guarding or rigidity. No Cyanosis, Clubbing or edema, No new Rash or bruise   Data Review   Micro Results Recent Results (from the past 240 hour(s))  MRSA PCR SCREENING     Status: None   Collection Time    09/22/12  2:41 AM      Result Value Range Status   MRSA by PCR NEGATIVE  NEGATIVE Final   Comment:            The GeneXpert MRSA Assay (FDA     approved for NASAL specimens     only), is one component of a     comprehensive MRSA colonization     surveillance program. It is not     intended to diagnose MRSA     infection nor to guide or     monitor treatment for     MRSA infections.    Radiology Reports Ct Abdomen Pelvis Wo Contrast  09/22/2012   *RADIOLOGY REPORT*  Clinical Data: Nausea, vomiting, diarrhea, abdominal pain  CT ABDOMEN AND PELVIS WITHOUT CONTRAST  Technique:  Multidetector CT imaging of the abdomen and pelvis was performed following the standard protocol without intravenous contrast.  Comparison: 09/07/2010  Findings: Acute renal failure diagnosed as elevated BUN and creatinine reportedly precluded performance of this exam with contrast, although suitability for contrast would be better performed with calculation of estimated GFR.  Examination is degraded by patient motion.  Curvilinear bilateral lower lobe scarring or atelectasis noted.  Moderate atheromatous aortic calcification without aneurysm. Unenhanced liver, right kidney, spleen/splenule, and pancreas are normal. Cholecystectomy clips noted.  An area of left mid renal cortical scarring is stable.  The right adrenal gland is normal.  A 1 cm low density left adrenal nodule is stable since 2012, too small to characterize but most compatible with an adenoma, for which no further specific follow-up is required given its stability for 2 years.  No  lymphadenopathy, ascites, or free air.  Evidence of left groin vascular surgery.  Uterus and ovaries are non visualized, presumably surgically absent.  The appendix not visualized but there is no secondary evidence for acute appendicitis.  No bowel wall thickening or focal segmental dilatation is identified.  Bladder is  normal.  Mild right hip degenerative change and lumbar spine degenerative change is identified.  No acute osseous abnormality.  Bones mildly osteopenic.  IMPRESSION: No acute intra-abdominal or pelvic pathology.   Original Report Authenticated By: Christiana Pellant, M.D.   Dg Chest 2 View  09/21/2012   *RADIOLOGY REPORT*  Clinical Data: Chest pain, shortness of breath  CHEST - 2 VIEW  Comparison: March 15, 2012.  Findings: Cardiomediastinal silhouette appears normal.  No acute pulmonary disease is noted.  Bony thorax is intact.  Stable right perihilar scarring is noted.  IMPRESSION: No acute cardiopulmonary abnormality seen.   Original Report Authenticated By: Lupita Raider.,  M.D.    CBC  Recent Labs Lab 09/21/12 1347 09/22/12 0600  WBC 8.2 6.1  HGB 11.0* 9.2*  HCT 32.0* 27.6*  PLT 326 270  MCV 76.6* 78.2  MCH 26.3 26.1  MCHC 34.4 33.3  RDW 15.8* 16.1*  LYMPHSABS 2.0 0.7  MONOABS 0.4 0.2  EOSABS 0.5 0.0  BASOSABS 0.1 0.0    Chemistries   Recent Labs Lab 09/21/12 1347 09/21/12 2012 09/21/12 2225 09/22/12 0600 09/22/12 1025  NA 133*  --   --  137  --   K 2.9*  --   --  5.0  --   CL 96  --   --  109  --   CO2 20  --   --  19  --   GLUCOSE 150*  --   --  318*  --   BUN 32*  --   --  21  --   CREATININE 2.01*  --   --  1.25*  --   CALCIUM 9.3  --   --  8.2*  --   MG  --   --  1.4*  --  1.8  AST  --  10  --  8  --   ALT  --  7  --  7  --   ALKPHOS  --  86  --  75  --   BILITOT  --  0.1*  --  <0.1*  --    ------------------------------------------------------------------------------------------------------------------ CrCl is unknown because both a height  and weight (above a minimum accepted value) are required for this calculation. ------------------------------------------------------------------------------------------------------------------ No results found for this basename: HGBA1C,  in the last 72 hours ------------------------------------------------------------------------------------------------------------------ No results found for this basename: CHOL, HDL, LDLCALC, TRIG, CHOLHDL, LDLDIRECT,  in the last 72 hours ------------------------------------------------------------------------------------------------------------------ No results found for this basename: TSH, T4TOTAL, FREET3, T3FREE, THYROIDAB,  in the last 72 hours ------------------------------------------------------------------------------------------------------------------ No results found for this basename: VITAMINB12, FOLATE, FERRITIN, TIBC, IRON, RETICCTPCT,  in the last 72 hours  Coagulation profile No results found for this basename: INR, PROTIME,  in the last 168 hours   Recent Labs  09/22/12 0600  DDIMER 1.43*    Cardiac Enzymes  Recent Labs Lab 09/21/12 1347 09/21/12 2225 09/22/12 0600  TROPONINI <0.30 <0.30 <0.30   ------------------------------------------------------------------------------------------------------------------ No components found with this basename: POCBNP,

## 2012-09-23 LAB — GLUCOSE, CAPILLARY
Glucose-Capillary: 190 mg/dL — ABNORMAL HIGH (ref 70–99)
Glucose-Capillary: 80 mg/dL (ref 70–99)
Glucose-Capillary: 149 mg/dL — ABNORMAL HIGH (ref 70–99)

## 2012-09-23 LAB — CBC
HCT: 29.8 % — ABNORMAL LOW (ref 36.0–46.0)
MCV: 79.3 fL (ref 78.0–100.0)
RDW: 16.6 % — ABNORMAL HIGH (ref 11.5–15.5)
WBC: 9.6 10*3/uL (ref 4.0–10.5)

## 2012-09-23 LAB — BASIC METABOLIC PANEL
BUN: 15 mg/dL (ref 6–23)
Chloride: 113 mEq/L — ABNORMAL HIGH (ref 96–112)
Creatinine, Ser: 0.88 mg/dL (ref 0.50–1.10)
GFR calc Af Amer: 78 mL/min — ABNORMAL LOW (ref >90)

## 2012-09-23 MED ORDER — INSULIN GLARGINE 100 UNIT/ML ~~LOC~~ SOLN: 10.0000 [IU] | Freq: Every day | SUBCUTANEOUS | Status: DC

## 2012-09-23 NOTE — Discharge Summary (Signed)
[name]                                                                                   April Branch, is a 67 y.o. female  DOB 1945-12-12  MRN [id number].  Admission date:  09/21/2012  Discharge Date:  09/23/2012  Primary MD  Quitman Livings, MD  Admitting Physician  Eduard Clos, MD  Admission Diagnosis  Diarrhea [787.91] Dehydration [276.51] Acute kidney injury (nontraumatic) [584.9] Chest pain [786.50]  Discharge Diagnosis     Principal Problem:   Chest pain Active Problems:   Hypothyroidism   Insulin dependent type 2 diabetes mellitus, controlled   Nausea vomiting and diarrhea   Acute kidney injury (nontraumatic)    Past Medical History  Diagnosis Date  . Hyperlipidemia   . Arthritis   . Hypothyroidism   . GERD (gastroesophageal reflux disease)   . Anxiety   . Diabetes mellitus age 36    insulin dependent  . Peripheral vascular disease   . Lumbar disc disease   . Insulin dependent type 2 diabetes mellitus, controlled   . GERD 07/20/2006  . CAD (coronary artery disease)   . CAD (coronary artery disease) of bypass graft   . Vertigo   . History of transient ischemic attack (TIA)   . Myocardial infarction     AGE 78    . Anginal pain   . Hypertension   . Depression   . COPD 12/14/2008    PATIENT DENIES    . Stroke     3 MINI STROKES  RT SIDED WEAKNESS  . Personal history of malignant neoplasm of kidney(V10.52) 12/14/2008    Laparoscopic biopsy and cryoablation 7/08 Dr. Normajean Baxter   . Headache(784.0)   . Neuromuscular disorder     PERIPHERAL NEUROPATHY  . Anemia   . 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 Diabetic coma Feb. 2014    Past Surgical History  Procedure Laterality Date  . Appendectomy    . Abdominal hysterectomy    . Knee surgery    . Cholecystectomy    . Lung surgery      lung nodule removed from the right side  . Laparascopic cryoablation of left kidney  08/2006    Dr. Sherre Poot for renal cell cancer  . Bladder  suspension    . Shoulder surgery    . Foot surgery    . Pr vein bypass graft,aorto-fem-pop  05/27/2010  . Cardiac catheterization    . Femoral-popliteal bypass graft  10/12/2011    Procedure: BYPASS GRAFT FEMORAL-POPLITEAL ARTERY;  Surgeon: Sherren Kerns, MD;  Location: [facility] OR;  Service: Vascular;  Laterality: Left;  Left Femoral-Popliteal Bypass Graft using 6mm x 80cm Propaten Graft with intraop arteriogram times one.  . Intraoperative arteriogram  10/12/2011    Procedure: INTRA OPERATIVE ARTERIOGRAM;  Surgeon: Sherren Kerns, MD;  Location: [facility] OR;  Service: Vascular;  Laterality: Left;     Recommendations for primary care physician for things to follow:    Is review CT scan results closely, monitor CBGs.   Discharge Diagnoses:   Principal Problem:   Chest pain Active Problems:  Hypothyroidism   Insulin dependent type 2 diabetes mellitus, controlled   Nausea vomiting and diarrhea   Acute kidney injury (nontraumatic)    Discharge Condition: stable   Diet recommendation: See Discharge Instructions below   Consults      History of present illness and  Hospital Course:     Kindly see H&P for history of present illness and admission details, please review complete Labs, Consult reports and Test reports for all details in brief JAQUIA Branch, is a 67 y.o. female, patient with history of DM-2,HTN, Chr Pain, Hypothyroidism, was admitted to the hospital with 4 week history of diarrhea, this likely was secondary to gastroenteritis versus side effect of Glucophage, she had no bowel movements whatsoever while she was in the hospital and Glucophage was on hold. She was also found to have lactic acidosis upon admission. Essentially treated supportively with IV fluids hydration and holding of Glucophage, her diarrhea has completely resolved, her lactic acidosis, dehydration and acute renal failure which were all caused due to severe diarrhea have completely resolved with hydration.  She also underwent abdominal CT scan which was unremarkable, she is now completely symptom free and eager to go home, I will discontinue her Glucophage and place him on low-dose Lantus. We'll request PCP to lay monitor glycemic control closely.   Also had transient nonspecific noncardiac chest pain in the ER, EKG had nonspecific nonacute changes, troponin trend was negative, she was completely chest pain-free after few minutes in the ER.    For her hypothyroidism she will continue home dose Synthroid.   Diabetes mellitus type 2 her A1c was 7.8, Glucophage stopped as above, continue sliding scale insulin will add 10 units of Lantus upon discharge , or twice to do q. a.c. at bedtime Accu-Cheks and given her discharge instructions in writing for the same.  CBG (last 3)   Recent Labs  09/22/12 1613 09/22/12 2106 09/23/12 0623  GLUCAP 250* 190* 80    Lab Results  Component Value Date   HGBA1C 7.4* 09/22/2012      Today   Subjective:   [name] today has no headache,no chest abdominal pain,no new weakness tingling or numbness, feels much better wants to go home today.    Objective:   Blood pressure 147/78, pulse 85, temperature 98.2 F (36.8 C), temperature source Oral, resp. rate 18, height 5\' 7"  (1.702 m), weight 72 kg (158 lb 11.7 oz), SpO2 97.00%.   Intake/Output Summary (Last 24 hours) at 09/23/12 1020 Last data filed at 09/23/12 0247  Gross per 24 hour  Intake    684 ml  Output   1200 ml  Net   -516 ml    Exam Awake Alert, Oriented *3, No new F.N deficits, Normal affect [place].AT,PERRAL Supple Neck,No JVD, No cervical lymphadenopathy appriciated.  Symmetrical Chest wall movement, Good air movement bilaterally, CTAB RRR,No Gallops,Rubs or new Murmurs, No Parasternal Heave +ve B.Sounds, Abd Soft, Non tender, No organomegaly appriciated, No rebound -guarding or rigidity. No Cyanosis, Clubbing or edema, No new Rash or bruise  Data Review   Major procedures  and Radiology Reports - PLEASE review detailed and final reports for all details, in brief -       Ct Abdomen Pelvis Wo Contrast  09/22/2012   *RADIOLOGY REPORT*  Clinical Data: Nausea, vomiting, diarrhea, abdominal pain  CT ABDOMEN AND PELVIS WITHOUT CONTRAST  Technique:  Multidetector CT imaging of the abdomen and pelvis was performed following the standard protocol without intravenous contrast.  Comparison: 09/07/2010  Findings: Acute renal failure diagnosed as elevated BUN and creatinine reportedly precluded performance of this exam with contrast, although suitability for contrast would be better performed with calculation of estimated GFR.  Examination is degraded by patient motion.  Curvilinear bilateral lower lobe scarring or atelectasis noted.  Moderate atheromatous aortic calcification without aneurysm. Unenhanced liver, right kidney, spleen/splenule, and pancreas are normal. Cholecystectomy clips noted.  An area of left mid renal cortical scarring is stable.  The right adrenal gland is normal.  A 1 cm low density left adrenal nodule is stable since 2012, too small to characterize but most compatible with an adenoma, for which no further specific follow-up is required given its stability for 2 years.  No lymphadenopathy, ascites, or free air.  Evidence of left groin vascular surgery.  Uterus and ovaries are non visualized, presumably surgically absent.  The appendix not visualized but there is no secondary evidence for acute appendicitis.  No bowel wall thickening or focal segmental dilatation is identified.  Bladder is normal.  Mild right hip degenerative change and lumbar spine degenerative change is identified.  No acute osseous abnormality.  Bones mildly osteopenic.  IMPRESSION: No acute intra-abdominal or pelvic pathology.   Original Report Authenticated By: Christiana Pellant, M.D.   Dg Chest 2 View  09/21/2012   *RADIOLOGY REPORT*  Clinical Data: Chest pain, shortness of breath  CHEST - 2 VIEW   Comparison: March 15, 2012.  Findings: Cardiomediastinal silhouette appears normal.  No acute pulmonary disease is noted.  Bony thorax is intact.  Stable right perihilar scarring is noted.  IMPRESSION: No acute cardiopulmonary abnormality seen.   Original Report Authenticated By: Lupita Raider.,  M.D.    Micro Results      Recent Results (from the past 240 hour(s))  URINE CULTURE     Status: None   Collection Time    09/21/12  5:07 PM      Result Value Range Status   Specimen Description URINE, CLEAN CATCH   Final   Special Requests NONE   Final   Culture  Setup Time 09/21/2012 22:53   Final   Colony Count 40,000 COLONIES/ML   Final   Culture     Final   Value: Multiple bacterial morphotypes present, none predominant. Suggest appropriate recollection if clinically indicated.   Report Status 09/22/2012 FINAL   Final  MRSA PCR SCREENING     Status: None   Collection Time    09/22/12  2:41 AM      Result Value Range Status   MRSA by PCR NEGATIVE  NEGATIVE Final   Comment:            The GeneXpert MRSA Assay (FDA     approved for NASAL specimens     only), is one component of a     comprehensive MRSA colonization     surveillance program. It is not     intended to diagnose MRSA     infection nor to guide or     monitor treatment for     MRSA infections.     CBC w Diff: Lab Results  Component Value Date   WBC 9.6 09/23/2012   HGB 10.0* 09/23/2012   HCT 29.8* 09/23/2012   PLT 274 09/23/2012   LYMPHOPCT 11* 09/22/2012   MONOPCT 3 09/22/2012   EOSPCT 1 09/22/2012   BASOPCT 1 09/22/2012    CMP: Lab Results  Component Value Date   NA 144 09/23/2012   K 4.3 09/23/2012  CL 113* 09/23/2012   CO2 23 09/23/2012   BUN 15 09/23/2012   CREATININE 0.88 09/23/2012   PROT 6.0 09/22/2012   ALBUMIN 2.8* 09/22/2012   BILITOT <0.1* 09/22/2012   ALKPHOS 75 09/22/2012   AST 8 09/22/2012   ALT 7 09/22/2012  .   Discharge Instructions     Follow with Primary MD Quitman Livings, MD in 3 days   Get CBC,  CMP, checked 3 days by Primary MD and again as instructed by your Primary MD.   Accuchecks 4 times/day, Once in AM empty stomach and then before each meal. Log in all results and show them to your Prim.MD in 3 days. If any glucose reading is under 80 or above 300 call your Prim MD immidiately. Follow Low glucose instructions for glucose under 80 as instructed.   Get Medicines reviewed and adjusted.  Please request your Prim.MD to go over all Hospital Tests and Procedure/Radiological results at the follow up, please get all Hospital records sent to your Prim MD by signing hospital release before you go home.  Activity: As tolerated with Full fall precautions use walker/cane & assistance as needed   Diet:  Heart healthy low carbohydrate  For Heart failure patients - Check your Weight same time everyday, if you gain over 2 pounds, or you develop in leg swelling, experience more shortness of breath or chest pain, call your Primary MD immediately. Follow Cardiac Low Salt Diet and 1.8 lit/day fluid restriction.  Disposition Home   If you experience worsening of your admission symptoms, develop shortness of breath, life threatening emergency, suicidal or homicidal thoughts you must seek medical attention immediately by calling 911 or calling your MD immediately  if symptoms less severe.  You Must read complete instructions/literature along with all the possible adverse reactions/side effects for all the Medicines you take and that have been prescribed to you. Take any new Medicines after you have completely understood and accpet all the possible adverse reactions/side effects.   Do not drive and provide baby sitting services if your were admitted for syncope or siezures until you have seen by Primary MD or a Neurologist and advised to do so again.  Do not drive when taking Pain medications.    Do not take more than prescribed Pain, Sleep and Anxiety Medications  Special Instructions: If you  have smoked or chewed Tobacco  in the last 2 yrs please stop smoking, stop any regular Alcohol  and or any Recreational drug use.  Wear Seat belts while driving.   Please note  You were cared for by a hospitalist during your hospital stay. If you have any questions about your discharge medications or the care you received while you were in the hospital after you are discharged, you can call the unit and asked to speak with the hospitalist on call if the hospitalist that took care of you is not available. Once you are discharged, your primary care physician will handle any further medical issues. Please note that NO REFILLS for any discharge medications will be authorized once you are discharged, as it is imperative that you return to your primary care physician (or establish a relationship with a primary care physician if you do not have one) for your aftercare needs so that they can reassess your need for medications and monitor your lab values.    Follow-up Information   Follow up with [facility], MD. Schedule an appointment as soon as possible for a visit in 3 days.   Contact  information:   [address] [place] [address] [phone]         Discharge Medications     Medication List    STOP taking these medications       metFORMIN 850 MG tablet  Commonly known as:  GLUCOPHAGE     metroNIDAZOLE 500 MG tablet  Commonly known as:  FLAGYL     sulfamethoxazole-trimethoprim 800-160 MG per tablet  Commonly known as:  BACTRIM DS      TAKE these medications       amitriptyline 75 MG tablet  Commonly known as:  ELAVIL  Take 75 mg by mouth at bedtime.     clopidogrel 75 MG tablet  Commonly known as:  PLAVIX  Take 75 mg by mouth every morning.     DULoxetine 30 MG capsule  Commonly known as:  CYMBALTA  Take 1 capsule (30 mg total) by mouth daily.     feeding supplement Liqd  Take 237 mLs by mouth 2 (two) times daily between meals.      HYDROcodone-acetaminophen 10-325 MG per tablet  Commonly known as:  NORCO  Take 1 tablet by mouth 3 (three) times daily as needed for pain.     insulin aspart 100 UNIT/ML injection  Commonly known as:  novoLOG  Inject 5-10 Units into the skin 3 (three) times daily with meals. Per sliding scale     insulin glargine 100 UNIT/ML injection  Commonly known as:  LANTUS  Inject 0.1 mLs (10 Units total) into the skin at bedtime. Dispense syringes and needles in needed, dispense Lantus pen if approved by insurance.     lansoprazole 30 MG capsule  Commonly known as:  PREVACID  Take 30 mg by mouth every morning.     levothyroxine 88 MCG tablet  Commonly known as:  SYNTHROID, LEVOTHROID  Take 88 mcg by mouth daily before breakfast.     loperamide 2 MG tablet  Commonly known as:  IMODIUM A-D  Take 4 mg by mouth daily as needed for diarrhea or loose stools.     LORazepam 1 MG tablet  Commonly known as:  ATIVAN  Take 3 mg by mouth at bedtime.     pravastatin 20 MG tablet  Commonly known as:  PRAVACHOL  Take 20 mg by mouth daily.     promethazine 25 MG suppository  Commonly known as:  PHENERGAN  Place 25 mg rectally every 6 (six) hours as needed for nausea.     tolterodine 4 MG 24 hr capsule  Commonly known as:  DETROL LA  Take 4 mg by mouth every morning.           Total Time in preparing paper work, data evaluation and todays exam - 35 minutes  Leroy Sea M.D on 09/23/2012 at 10:20 AM  [facility] Office  [phone]

## 2012-09-23 NOTE — Progress Notes (Signed)
The patient did not have any acute changes overnight. 

## 2012-09-23 NOTE — Progress Notes (Signed)
Went over all discharge instructions including follow up appts. . Pt given prescription for Lantus insulin. Discharged per w/c with all belongings. Accompanied by nurse tech. Boyfriend taking pt home per private vehicle.

## 2012-09-28 ENCOUNTER — Other Ambulatory Visit: Payer: Self-pay

## 2012-12-22 ENCOUNTER — Encounter: Payer: Self-pay | Admitting: Gastroenterology

## 2012-12-29 ENCOUNTER — Other Ambulatory Visit: Payer: Self-pay

## 2013-01-24 ENCOUNTER — Ambulatory Visit: Payer: Self-pay | Admitting: Gastroenterology

## 2013-01-24 ENCOUNTER — Encounter: Payer: Self-pay | Admitting: Gastroenterology

## 2013-02-01 ENCOUNTER — Telehealth: Payer: Self-pay | Admitting: Gastroenterology

## 2013-02-01 NOTE — Telephone Encounter (Signed)
Message copied by Leanor Kail I on Wed Feb 01, 2013  2:12 PM ------      Message from: Donata Duff      Created: Tue Jan 24, 2013 11:02 AM       Do not bill pt ------

## 2013-02-22 ENCOUNTER — Ambulatory Visit: Payer: Self-pay | Admitting: Gastroenterology

## 2013-03-27 ENCOUNTER — Other Ambulatory Visit: Payer: Self-pay | Admitting: Neurosurgery

## 2013-03-27 DIAGNOSIS — I739 Peripheral vascular disease, unspecified: Secondary | ICD-10-CM

## 2013-03-27 DIAGNOSIS — Z48812 Encounter for surgical aftercare following surgery on the circulatory system: Secondary | ICD-10-CM

## 2013-05-03 ENCOUNTER — Encounter: Payer: Self-pay | Admitting: Family

## 2013-05-04 ENCOUNTER — Ambulatory Visit (HOSPITAL_COMMUNITY)
Admission: RE | Admit: 2013-05-04 | Discharge: 2013-05-04 | Disposition: A | Discharge status: Home / Self Care | Payer: Medicare HMO | Source: Ambulatory Visit | Attending: Family | Admitting: Family

## 2013-05-04 ENCOUNTER — Ambulatory Visit (INDEPENDENT_AMBULATORY_CARE_PROVIDER_SITE_OTHER)
Admission: RE | Admit: 2013-05-04 | Discharge: 2013-05-04 | Disposition: A | Discharge status: Home / Self Care | Payer: Medicare HMO | Source: Ambulatory Visit | Attending: Neurosurgery | Admitting: Neurosurgery

## 2013-05-04 ENCOUNTER — Ambulatory Visit (INDEPENDENT_AMBULATORY_CARE_PROVIDER_SITE_OTHER): Payer: Medicare HMO | Admitting: Family

## 2013-05-04 ENCOUNTER — Encounter: Payer: Self-pay | Admitting: Family

## 2013-05-04 VITALS — BP 135/76 | HR 78 | Ht 67.0 in | Wt 176.1 lb

## 2013-05-04 DIAGNOSIS — I739 Peripheral vascular disease, unspecified: Secondary | ICD-10-CM

## 2013-05-04 DIAGNOSIS — I6529 Occlusion and stenosis of unspecified carotid artery: Secondary | ICD-10-CM

## 2013-05-04 DIAGNOSIS — Z48812 Encounter for surgical aftercare following surgery on the circulatory system: Secondary | ICD-10-CM

## 2013-05-04 NOTE — Patient Instructions (Addendum)
Peripheral Vascular Disease Peripheral Vascular Disease (PVD), also called Peripheral Arterial Disease (PAD), is a circulation problem caused by cholesterol (atherosclerotic plaque) deposits in the arteries. PVD commonly occurs in the lower extremities (legs) but it can occur in other areas of the body, such as your arms. The cholesterol buildup in the arteries reduces blood flow which can cause pain and other serious problems. The presence of PVD can place a person at risk for Coronary Artery Disease (CAD).  CAUSES  Causes of PVD can be many. It is usually associated with more than one risk factor such as:   High Cholesterol.  Smoking.  Diabetes.  Lack of exercise or inactivity.  High blood pressure (hypertension).  Obesity.  Family history. SYMPTOMS   When the lower extremities are affected, patients with PVD may experience:  Leg pain with exertion or physical activity. This is called INTERMITTENT CLAUDICATION. This may present as cramping or numbness with physical activity. The location of the pain is associated with the level of blockage. For example, blockage at the abdominal level (distal abdominal aorta) may result in buttock or hip pain. Lower leg arterial blockage may result in calf pain.  As PVD becomes more severe, pain can develop with less physical activity.  In people with severe PVD, leg pain may occur at rest.  Other PVD signs and symptoms:  Leg numbness or weakness.  Coldness in the affected leg or foot, especially when compared to the other leg.  A change in leg color.  Patients with significant PVD are more prone to ulcers or sores on toes, feet or legs. These may take longer to heal or may reoccur. The ulcers or sores can become infected.  If signs and symptoms of PVD are ignored, gangrene may occur. This can result in the loss of toes or loss of an entire limb.  Not all leg pain is related to PVD. Other medical conditions can cause leg pain such  as:  Blood clots (embolism) or Deep Vein Thrombosis.  Inflammation of the blood vessels (vasculitis).  Spinal stenosis. DIAGNOSIS  Diagnosis of PVD can involve several different types of tests. These can include:  Pulse Volume Recording Method (PVR). This test is simple, painless and does not involve the use of X-rays. PVR involves measuring and comparing the blood pressure in the arms and legs. An ABI (Ankle-Brachial Index) is calculated. The normal ratio of blood pressures is 1. As this number becomes smaller, it indicates more severe disease.  < 0.95  indicates significant narrowing in one or more leg vessels.  <0.8 there will usually be pain in the foot, leg or buttock with exercise.  <0.4 will usually have pain in the legs at rest.  <0.25  usually indicates limb threatening PVD.  Doppler detection of pulses in the legs. This test is painless and checks to see if you have a pulses in your legs/feet.  A dye or contrast material (a substance that highlights the blood vessels so they show up on x-ray) may be given to help your caregiver better see the arteries for the following tests. The dye is eliminated from your body by the kidney's. Your caregiver may order blood work to check your kidney function and other laboratory values before the following tests are performed:  Magnetic Resonance Angiography (MRA). An MRA is a picture study of the blood vessels and arteries. The MRA machine uses a large magnet to produce images of the blood vessels.  Computed Tomography Angiography (CTA). A CTA is a   specialized x-ray that looks at how the blood flows in your blood vessels. An IV may be inserted into your arm so contrast dye can be injected.  Angiogram. Is a procedure that uses x-rays to look at your blood vessels. This procedure is minimally invasive, meaning a small incision (cut) is made in your groin. A small tube (catheter) is then inserted into the artery of your groin. The catheter is  guided to the blood vessel or artery your caregiver wants to examine. Contrast dye is injected into the catheter. X-rays are then taken of the blood vessel or artery. After the images are obtained, the catheter is taken out. TREATMENT  Treatment of PVD involves many interventions which may include:  Lifestyle changes:  Quitting smoking.  Exercise.  Following a low fat, low cholesterol diet.  Control of diabetes.  Foot care is very important to the PVD patient. Good foot care can help prevent infection.  Medication:  Cholesterol-lowering medicine.  Blood pressure medicine.  Anti-platelet drugs.  Certain medicines may reduce symptoms of Intermittent Claudication.  Interventional/Surgical options:  Angioplasty. An Angioplasty is a procedure that inflates a balloon in the blocked artery. This opens the blocked artery to improve blood flow.  Stent Implant. A wire mesh tube (stent) is placed in the artery. The stent expands and stays in place, allowing the artery to remain open.  Peripheral Bypass Surgery. This is a surgical procedure that reroutes the blood around a blocked artery to help improve blood flow. This type of procedure may be performed if Angioplasty or stent implants are not an option. SEEK IMMEDIATE MEDICAL CARE IF:   You develop pain or numbness in your arms or legs.  Your arm or leg turns cold, becomes blue in color.  You develop redness, warmth, swelling and pain in your arms or legs. MAKE SURE YOU:   Understand these instructions.  Will watch your condition.  Will get help right away if you are not doing well or get worse. Document Released: 03/19/2004 Document Revised: 05/04/2011 Document Reviewed: 02/14/2008 [employer] Patient Information 2014 [employer].   Smoking Cessation Quitting smoking is important to your health and has many advantages. However, it is not always easy to quit since nicotine is a very addictive drug. Often times, people try 3  times or more before being able to quit. This document explains the best ways for you to prepare to quit smoking. Quitting takes hard work and a lot of effort, but you can do it. ADVANTAGES OF QUITTING SMOKING  You will live longer, feel better, and live better.  Your body will feel the impact of quitting smoking almost immediately.  Within 20 minutes, blood pressure decreases. Your pulse returns to its normal level.  After 8 hours, carbon monoxide levels in the blood return to normal. Your oxygen level increases.  After 24 hours, the chance of having a heart attack starts to decrease. Your breath, hair, and body stop smelling like smoke.  After 48 hours, damaged nerve endings begin to recover. Your sense of taste and smell improve.  After 72 hours, the body is virtually free of nicotine. Your bronchial tubes relax and breathing becomes easier.  After 2 to 12 weeks, lungs can hold more air. Exercise becomes easier and circulation improves.  The risk of having a heart attack, stroke, cancer, or lung disease is greatly reduced.  After 1 year, the risk of coronary heart disease is cut in half.  After 5 years, the risk of stroke falls to   the same as a nonsmoker.  After 10 years, the risk of lung cancer is cut in half and the risk of other cancers decreases significantly.  After 15 years, the risk of coronary heart disease drops, usually to the level of a nonsmoker.  If you are pregnant, quitting smoking will improve your chances of having a healthy baby.  The people you live with, especially any children, will be healthier.  You will have extra money to spend on things other than cigarettes. QUESTIONS TO THINK ABOUT BEFORE ATTEMPTING TO QUIT You may want to talk about your answers with your caregiver.  Why do you want to quit?  If you tried to quit in the past, what helped and what did not?  What will be the most difficult situations for you after you quit? How will you plan to  handle them?  Who can help you through the tough times? Your family? Friends? A caregiver?  What pleasures do you get from smoking? What ways can you still get pleasure if you quit? Here are some questions to ask your caregiver:  How can you help me to be successful at quitting?  What medicine do you think would be best for me and how should I take it?  What should I do if I need more help?  What is smoking withdrawal like? How can I get information on withdrawal? GET READY  Set a quit date.  Change your environment by getting rid of all cigarettes, ashtrays, matches, and lighters in your home, car, or work. Do not let people smoke in your home.  Review your past attempts to quit. Think about what worked and what did not. GET SUPPORT AND ENCOURAGEMENT You have a better chance of being successful if you have help. You can get support in many ways.  Tell your family, friends, and co-workers that you are going to quit and need their support. Ask them not to smoke around you.  Get individual, group, or telephone counseling and support. Programs are available at local hospitals and health centers. Call your local health department for information about programs in your area.  Spiritual beliefs and practices may help some smokers quit.  Download a "quit meter" on your computer to keep track of quit statistics, such as how long you have gone without smoking, cigarettes not smoked, and money saved.  Get a self-help book about quitting smoking and staying off of tobacco. LEARN NEW SKILLS AND BEHAVIORS  Distract yourself from urges to smoke. Talk to someone, go for a walk, or occupy your time with a task.  Change your normal routine. Take a different route to work. Drink tea instead of coffee. Eat breakfast in a different place.  Reduce your stress. Take a hot bath, exercise, or read a book.  Plan something enjoyable to do every day. Reward yourself for not smoking.  Explore  interactive web-based programs that specialize in helping you quit. GET MEDICINE AND USE IT CORRECTLY Medicines can help you stop smoking and decrease the urge to smoke. Combining medicine with the above behavioral methods and support can greatly increase your chances of successfully quitting smoking.  Nicotine replacement therapy helps deliver nicotine to your body without the negative effects and risks of smoking. Nicotine replacement therapy includes nicotine gum, lozenges, inhalers, nasal sprays, and skin patches. Some may be available over-the-counter and others require a prescription.  Antidepressant medicine helps people abstain from smoking, but how this works is unknown. This medicine is available by prescription.    Nicotinic receptor partial agonist medicine simulates the effect of nicotine in your brain. This medicine is available by prescription. Ask your caregiver for advice about which medicines to use and how to use them based on your health history. Your caregiver will tell you what side effects to look out for if you choose to be on a medicine or therapy. Carefully read the information on the package. Do not use any other product containing nicotine while using a nicotine replacement product.  RELAPSE OR DIFFICULT SITUATIONS Most relapses occur within the first 3 months after quitting. Do not be discouraged if you start smoking again. Remember, most people try several times before finally quitting. You may have symptoms of withdrawal because your body is used to nicotine. You may crave cigarettes, be irritable, feel very hungry, cough often, get headaches, or have difficulty concentrating. The withdrawal symptoms are only temporary. They are strongest when you first quit, but they will go away within 10 14 days. To reduce the chances of relapse, try to:  Avoid drinking alcohol. Drinking lowers your chances of successfully quitting.  Reduce the amount of caffeine you consume. Once you  quit smoking, the amount of caffeine in your body increases and can give you symptoms, such as a rapid heartbeat, sweating, and anxiety.  Avoid smokers because they can make you want to smoke.  Do not let weight gain distract you. Many smokers will gain weight when they quit, usually less than 10 pounds. Eat a healthy diet and stay active. You can always lose the weight gained after you quit.  Find ways to improve your mood other than smoking. FOR MORE INFORMATION  www.smokefree.gov  Document Released: 02/03/2001 Document Revised: 08/11/2011 Document Reviewed: 05/21/2011 [employer] Patient Information 2014 [employer].   Stroke Prevention Some medical conditions and behaviors are associated with an increased chance of having a stroke. You may prevent a stroke by making healthy choices and managing medical conditions. HOW CAN I REDUCE MY RISK OF HAVING A STROKE?   Stay physically active. Get at least 30 minutes of activity on most or all days.  Do not smoke. It may also be helpful to avoid exposure to secondhand smoke.  Limit alcohol use. Moderate alcohol use is considered to be:  No more than 2 drinks per day for men.  No more than 1 drink per day for nonpregnant women.  Eat healthy foods. This involves  Eating 5 or more servings of fruits and vegetables a day.  Following a diet that addresses high blood pressure (hypertension), high cholesterol, diabetes, or obesity.  Manage your cholesterol levels.  A diet low in saturated fat, trans fat, and cholesterol and high in fiber may control cholesterol levels.  Take any prescribed medicines to control cholesterol as directed by your health care provider.  Manage your diabetes.  A controlled-carbohydrate, controlled-sugar diet is recommended to manage diabetes.  Take any prescribed medicines to control diabetes as directed by your health care provider.  Control your hypertension.  A low-salt (sodium), low-saturated fat,  low-trans fat, and low-cholesterol diet is recommended to manage hypertension.  Take any prescribed medicines to control hypertension as directed by your health care provider.  Maintain a healthy weight.  A reduced-calorie, low-sodium, low-saturated fat, low-trans fat, low-cholesterol diet is recommended to manage weight.  Stop drug abuse.  Avoid taking birth control pills.  Talk to your health care provider about the risks of taking birth control pills if you are over 35 years old, smoke, get migraines, or have   ever had a blood clot.  Get evaluated for sleep disorders (sleep apnea).  Talk to your health care provider about getting a sleep evaluation if you snore a lot or have excessive sleepiness.  Take medicines as directed by your health care provider.  For some people, aspirin or blood thinners (anticoagulants) are helpful in reducing the risk of forming abnormal blood clots that can lead to stroke. If you have the irregular heart rhythm of atrial fibrillation, you should be on a blood thinner unless there is a good reason you cannot take them.  Understand all your medicine instructions.  Make sure that other other conditions (such as anemia or atherosclerosis) are addressed. SEEK IMMEDIATE MEDICAL CARE IF:   You have sudden weakness or numbness of the face, arm, or leg, especially on one side of the body.  Your face or eyelid droops to one side.  You have sudden confusion.  You have trouble speaking (aphasia) or understanding.  You have sudden trouble seeing in one or both eyes.  You have sudden trouble walking.  You have dizziness.  You have a loss of balance or coordination.  You have a sudden, severe headache with no known cause.  You have new chest pain or an irregular heartbeat. Any of these symptoms may represent a serious problem that is an emergency. Do not wait to see if the symptoms will go away. Get medical help at once. Call your local emergency services   (911 in [place] Do not drive yourself to the hospital. Document Released: 03/19/2004 Document Revised: 11/30/2012 Document Reviewed: 08/12/2012 [employer] Patient Information 2014 [employer].  

## 2013-05-04 NOTE — Progress Notes (Signed)
[facility] [place] HISTORY AND PHYSICAL -PAD  History of Present Illness April Branch is a 68 y.o. female patient of Dr. Oneida Alar status post left femoropopliteal bypass graft with propaten on 10/12/2011. She returns today for routine surveillance. She has known lumbar spine issues with radiculopathy in both legs. She gets choked easily, has to drink through a straw. She has dry cracked fissures on both heals, no infection, no drainage, no other non healing wounds. 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 She has intermittent dizziness, has tingling and numbness in both hands, weakness in both hands, moreso in left.  She had severe headaches several years ago which prompted brain imaging, states was found she had 3 ministrokes, she does not recall any TIA symptoms other than the headache, denies lateralizing symptoms.  The patient denies New Medical or Surgical History.  Pt Diabetic: Yes Pt smoker: smoker  (1/2 ppd x since age 61 yrs)  Pt meds include: Statin :Yes ASA: No Other anticoagulants/antiplatelets: Plavix  Past Medical History  Diagnosis Date  . Hyperlipidemia   . Arthritis   . Hypothyroidism   . GERD (gastroesophageal reflux disease)   . Anxiety   . Diabetes mellitus age 23    insulin dependent  . Peripheral vascular disease   . Lumbar disc disease   . Insulin dependent type 2 diabetes mellitus, controlled   . GERD 07/20/2006  . CAD (coronary artery disease)   . CAD (coronary artery disease) of bypass graft   . Vertigo   . History of transient ischemic attack (TIA)   . Myocardial infarction     AGE 44    . Anginal pain   . Hypertension   . Depression   . COPD 12/14/2008    PATIENT DENIES    . Stroke     3 MINI STROKES  RT SIDED WEAKNESS  . Personal history of malignant neoplasm of kidney(V10.52) 12/14/2008    Laparoscopic biopsy and cryoablation 7/08 Dr. Vernie Shanks   . Headache(784.0)   . Neuromuscular  disorder     PERIPHERAL NEUROPATHY  . Anemia   . 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 Diabetic coma Feb. 2014    Social History History  Substance Use Topics  . Smoking status: Former Smoker -- 0.50 packs/day for 50 years    Types: Cigarettes    Quit date: 07/12/2011  . Smokeless tobacco: Never Used  . Alcohol Use: No    Family History Family History  Problem Relation Age of Onset  . Heart disease Mother   . Heart disease Father   . Heart disease Sister   . Lung cancer Mother   . Lung cancer Father     Past Surgical History  Procedure Laterality Date  . Appendectomy    . Abdominal hysterectomy    . Knee surgery    . Cholecystectomy    . Lung surgery      lung nodule removed from the right side  . Laparascopic cryoablation of left kidney  08/2006    Dr. Gladis Riffle for renal cell cancer  . Bladder suspension    . Shoulder surgery    . Foot surgery    . Pr vein bypass graft,aorto-fem-pop  05/27/2010  . Cardiac catheterization    . Femoral-popliteal bypass graft  10/12/2011    Procedure: BYPASS GRAFT FEMORAL-POPLITEAL ARTERY;  Surgeon: Elam Dutch, MD;  Location: [facility];  Service: Vascular;  Laterality: Left;  Left Femoral-Popliteal Bypass Graft using 83mm x 80cm Propaten Graft with intraop arteriogram times one.  . Intraoperative arteriogram  10/12/2011    Procedure: INTRA OPERATIVE ARTERIOGRAM;  Surgeon: Elam Dutch, MD;  Location: [facility];  Service: Vascular;  Laterality: Left;    Allergies  Allergen Reactions  . Niacin-Simvastatin Er Hives    Current Outpatient Prescriptions  Medication Sig Dispense Refill  . amitriptyline (ELAVIL) 75 MG tablet Take 75 mg by mouth at bedtime.      . clopidogrel (PLAVIX) 75 MG tablet Take 75 mg by mouth every morning.      . DULoxetine (CYMBALTA) 30 MG capsule Take 1 capsule (30 mg total) by mouth daily.  30 capsule  0  . feeding supplement (ENSURE COMPLETE) LIQD Take 237 mLs by mouth 2 (two) times daily between  meals.      Marland Kitchen HYDROcodone-acetaminophen (NORCO) 10-325 MG per tablet Take 1 tablet by mouth 3 (three) times daily as needed for pain.      Marland Kitchen insulin aspart (NOVOLOG) 100 UNIT/ML injection Inject 5-10 Units into the skin 3 (three) times daily with meals. Per sliding scale      . insulin glargine (LANTUS) 100 UNIT/ML injection Inject 0.1 mLs (10 Units total) into the skin at bedtime. Dispense syringes and needles in needed, dispense Lantus pen if approved by insurance.  10 mL  12  . lansoprazole (PREVACID) 30 MG capsule Take 30 mg by mouth every morning.      Marland Kitchen levothyroxine (SYNTHROID, LEVOTHROID) 88 MCG tablet Take 88 mcg by mouth daily before breakfast.      . loperamide (IMODIUM A-D) 2 MG tablet Take 4 mg by mouth daily as needed for diarrhea or loose stools.      Marland Kitchen LORazepam (ATIVAN) 1 MG tablet Take 3 mg by mouth at bedtime.      . pravastatin (PRAVACHOL) 20 MG tablet Take 20 mg by mouth daily.      . promethazine (PHENERGAN) 25 MG suppository Place 25 mg rectally every 6 (six) hours as needed for nausea.      Marland Kitchen tolterodine (DETROL LA) 4 MG 24 hr capsule Take 4 mg by mouth every morning.      . [DISCONTINUED] Gabapentin (NEURONTIN PO) Take by mouth. New medicine, pt doesn't remember dosage       No current facility-administered medications for this visit.    ROS: See HPI for pertinent positives and negatives.   Physical Examination  Filed Vitals:   05/04/13 1418  BP: 135/76  Pulse: 78   Filed Weights   05/04/13 1418  Weight: 176 lb 1.6 oz (79.878 kg)   Body mass index is 27.57 kg/(m^2).   General: A&O x 3, WDWN, obese female. Gait: normal Eyes: PERRLA. Pulmonary: CTAB, without wheezes , rales or rhonchi. Cardiac: regular Rythm , without detected murmur.         Carotid Bruits Left Right   Negative Negative  Aorta is not palpable. Radial pulses: right is 1+ palpable, left is not present, left brachial pulse is 2+ palpable                           VASCULAR  EXAM: Extremities without ischemic changes  without Gangrene; without open wounds.  LE Pulses LEFT RIGHT       FEMORAL  not palpable  not palpable        POPLITEAL  not palpable   not palpable       POSTERIOR TIBIAL  not palpable   not palpable        DORSALIS PEDIS      ANTERIOR TIBIAL  palpable  not palpable    Abdomen: soft, NT, no masses. Skin: no rashes, no ulcers noted. Musculoskeletal: no muscle wasting or atrophy.  Neurologic: A&O X 3; Appropriate Affect ; SENSATION: normal; MOTOR FUNCTION:  moving all extremities equally, motor strength 5/5 throughout. Speech is fluent/normal. CN 2-12 intact except for uvula deviation to the right.    Non-Invasive Vascular Imaging: DATE: 05/04/2013 LOWER EXTREMITY ARTERIAL DUPLEX EVALUATION    INDICATION: Peripheral Vascular Disease     PREVIOUS INTERVENTION(S): Left femoral-popliteal artery bypass graft placed 10/12/2011.    DUPLEX EXAM:     RIGHT  LEFT   Peak Systolic Velocity (cm/s) Ratio (if abnormal) Waveform  Peak Systolic Velocity (cm/s) Ratio (if abnormal) Waveform     Inflow Artery 195/200  B/T     Proximal Anastomosis 220  T     Proximal Graft 71  B     Mid Graft 86  T      Distal Graft 83  B     Distal Anastomosis 101  T     Outflow Artery 151  B  0.65/0.52, mono, biphasic Today's ABI / TBI 1.08/0.73, tri, and biphasic  0.90 Previous ABI / TBI (04/28/2012  ) 1.23    Waveform:    M - Monophasic       B - Biphasic       T - Triphasic  If Ankle Brachial Index (ABI) or Toe Brachial Index (TBI) performed, please see complete report     ADDITIONAL FINDINGS:     IMPRESSION: Patent left superficial femoral to popliteal artery bypass graft, without hemodynamically significant stenosis present.    Compared to the previous exam:  Decreased right ankle brachial index and stable left since study 04/28/2012.      Outside Studies/Documentation  ASSESSMENT: EDINA WINNINGHAM is a 68 y.o. female who is status post left femoropopliteal bypass graft with propaten on 10/12/2011. ABI's indicate moderate arterial occlusive disease in the right leg which has decreased from a year ago, normal ABI's in the left leg. Patent left superficial femoral to popliteal artery bypass graft, without hemodynamically significant stenosis present.  States he had brain imaging about 5 years a go which demonstrated 3 TIA'S, has intermittent dizziness, has tingling and numbness in both hands, weakness in both hands, moreso in left. Last carotid Duplex on file result was from 8/5/20013, showed 60-79% right ICA stenosis and 40-59% left ICA stenosis, requested by Dr. Burt Knack, pt does not recall this. Her DM is uncontrolled and she continues to smoke.  PLAN:  She was counseled re smoking cessation. She was advised to work closely with her PCP to get her DM under control. I discussed in depth with the patient the nature of atherosclerosis, and emphasized the importance of maximal medical management including strict control of blood pressure, blood glucose, and lipid levels, obtaining regular exercise, and cessation of smoking.  The patient is aware that without maximal medical management the underlying atherosclerotic disease process will progress, limiting the benefit of any interventions.  Based on the patient's vascular studies and examination, pt will return to clinic in 2 weeks for carotid Duplex,  in 6 months for ABI's and LLE arterial Duplex.  The patient was given information about PAD including signs, symptoms, treatment, what symptoms should prompt the patient to seek immediate medical care, and risk reduction measures to take.  Clemon Chambers, RN, MSN, FNP-C [facility] Phone: [phone]  Clinic MD: Scot Dock on call  05/04/2013 2:25 PM

## 2013-05-08 NOTE — Addendum Note (Signed)
Addended by: Mena Goes on: 05/08/2013 04:35 PM   Modules accepted: Orders

## 2013-05-18 ENCOUNTER — Encounter: Payer: Self-pay | Admitting: Family

## 2013-05-19 ENCOUNTER — Ambulatory Visit (HOSPITAL_COMMUNITY)
Admission: RE | Admit: 2013-05-19 | Discharge: 2013-05-19 | Disposition: A | Discharge status: Home / Self Care | Payer: Medicare HMO | Source: Ambulatory Visit | Attending: Family | Admitting: Family

## 2013-05-19 ENCOUNTER — Ambulatory Visit (INDEPENDENT_AMBULATORY_CARE_PROVIDER_SITE_OTHER): Payer: Medicare HMO | Admitting: Family

## 2013-05-19 ENCOUNTER — Encounter: Payer: Self-pay | Admitting: Family

## 2013-05-19 VITALS — BP 108/67 | HR 79 | Resp 16 | Ht 62.0 in | Wt 179.0 lb

## 2013-05-19 DIAGNOSIS — I6529 Occlusion and stenosis of unspecified carotid artery: Secondary | ICD-10-CM

## 2013-05-19 NOTE — Patient Instructions (Signed)
Peripheral Vascular Disease Peripheral Vascular Disease (PVD), also called Peripheral Arterial Disease (PAD), is a circulation problem caused by cholesterol (atherosclerotic plaque) deposits in the arteries. PVD commonly occurs in the lower extremities (legs) but it can occur in other areas of the body, such as your arms. The cholesterol buildup in the arteries reduces blood flow which can cause pain and other serious problems. The presence of PVD can place a person at risk for Coronary Artery Disease (CAD).  CAUSES  Causes of PVD can be many. It is usually associated with more than one risk factor such as:   High Cholesterol.  Smoking.  Diabetes.  Lack of exercise or inactivity.  High blood pressure (hypertension).  Obesity.  Family history. SYMPTOMS   When the lower extremities are affected, patients with PVD may experience:  Leg pain with exertion or physical activity. This is called INTERMITTENT CLAUDICATION. This may present as cramping or numbness with physical activity. The location of the pain is associated with the level of blockage. For example, blockage at the abdominal level (distal abdominal aorta) may result in buttock or hip pain. Lower leg arterial blockage may result in calf pain.  As PVD becomes more severe, pain can develop with less physical activity.  In people with severe PVD, leg pain may occur at rest.  Other PVD signs and symptoms:  Leg numbness or weakness.  Coldness in the affected leg or foot, especially when compared to the other leg.  A change in leg color.  Patients with significant PVD are more prone to ulcers or sores on toes, feet or legs. These may take longer to heal or may reoccur. The ulcers or sores can become infected.  If signs and symptoms of PVD are ignored, gangrene may occur. This can result in the loss of toes or loss of an entire limb.  Not all leg pain is related to PVD. Other medical conditions can cause leg pain such  as:  Blood clots (embolism) or Deep Vein Thrombosis.  Inflammation of the blood vessels (vasculitis).  Spinal stenosis. DIAGNOSIS  Diagnosis of PVD can involve several different types of tests. These can include:  Pulse Volume Recording Method (PVR). This test is simple, painless and does not involve the use of X-rays. PVR involves measuring and comparing the blood pressure in the arms and legs. An ABI (Ankle-Brachial Index) is calculated. The normal ratio of blood pressures is 1. As this number becomes smaller, it indicates more severe disease.  < 0.95  indicates significant narrowing in one or more leg vessels.  <0.8 there will usually be pain in the foot, leg or buttock with exercise.  <0.4 will usually have pain in the legs at rest.  <0.25  usually indicates limb threatening PVD.  Doppler detection of pulses in the legs. This test is painless and checks to see if you have a pulses in your legs/feet.  A dye or contrast material (a substance that highlights the blood vessels so they show up on x-ray) may be given to help your caregiver better see the arteries for the following tests. The dye is eliminated from your body by the kidney's. Your caregiver may order blood work to check your kidney function and other laboratory values before the following tests are performed:  Magnetic Resonance Angiography (MRA). An MRA is a picture study of the blood vessels and arteries. The MRA machine uses a large magnet to produce images of the blood vessels.  Computed Tomography Angiography (CTA). A CTA is a   specialized x-ray that looks at how the blood flows in your blood vessels. An IV may be inserted into your arm so contrast dye can be injected.  Angiogram. Is a procedure that uses x-rays to look at your blood vessels. This procedure is minimally invasive, meaning a small incision (cut) is made in your groin. A small tube (catheter) is then inserted into the artery of your groin. The catheter is  guided to the blood vessel or artery your caregiver wants to examine. Contrast dye is injected into the catheter. X-rays are then taken of the blood vessel or artery. After the images are obtained, the catheter is taken out. TREATMENT  Treatment of PVD involves many interventions which may include:  Lifestyle changes:  Quitting smoking.  Exercise.  Following a low fat, low cholesterol diet.  Control of diabetes.  Foot care is very important to the PVD patient. Good foot care can help prevent infection.  Medication:  Cholesterol-lowering medicine.  Blood pressure medicine.  Anti-platelet drugs.  Certain medicines may reduce symptoms of Intermittent Claudication.  Interventional/Surgical options:  Angioplasty. An Angioplasty is a procedure that inflates a balloon in the blocked artery. This opens the blocked artery to improve blood flow.  Stent Implant. A wire mesh tube (stent) is placed in the artery. The stent expands and stays in place, allowing the artery to remain open.  Peripheral Bypass Surgery. This is a surgical procedure that reroutes the blood around a blocked artery to help improve blood flow. This type of procedure may be performed if Angioplasty or stent implants are not an option. SEEK IMMEDIATE MEDICAL CARE IF:   You develop pain or numbness in your arms or legs.  Your arm or leg turns cold, becomes blue in color.  You develop redness, warmth, swelling and pain in your arms or legs. MAKE SURE YOU:   Understand these instructions.  Will watch your condition.  Will get help right away if you are not doing well or get worse. Document Released: 03/19/2004 Document Revised: 05/04/2011 Document Reviewed: 02/14/2008 ExitCare Patient Information 2014 [employer].   Smoking Cessation Quitting smoking is important to your health and has many advantages. However, it is not always easy to quit since nicotine is a very addictive drug. Often times, people try 3  times or more before being able to quit. This document explains the best ways for you to prepare to quit smoking. Quitting takes hard work and a lot of effort, but you can do it. ADVANTAGES OF QUITTING SMOKING  You will live longer, feel better, and live better.  Your body will feel the impact of quitting smoking almost immediately.  Within 20 minutes, blood pressure decreases. Your pulse returns to its normal level.  After 8 hours, carbon monoxide levels in the blood return to normal. Your oxygen level increases.  After 24 hours, the chance of having a heart attack starts to decrease. Your breath, hair, and body stop smelling like smoke.  After 48 hours, damaged nerve endings begin to recover. Your sense of taste and smell improve.  After 72 hours, the body is virtually free of nicotine. Your bronchial tubes relax and breathing becomes easier.  After 2 to 12 weeks, lungs can hold more air. Exercise becomes easier and circulation improves.  The risk of having a heart attack, stroke, cancer, or lung disease is greatly reduced.  After 1 year, the risk of coronary heart disease is cut in half.  After 5 years, the risk of stroke falls to   the same as a nonsmoker.  After 10 years, the risk of lung cancer is cut in half and the risk of other cancers decreases significantly.  After 15 years, the risk of coronary heart disease drops, usually to the level of a nonsmoker.  If you are pregnant, quitting smoking will improve your chances of having a healthy baby.  The people you live with, especially any children, will be healthier.  You will have extra money to spend on things other than cigarettes. QUESTIONS TO THINK ABOUT BEFORE ATTEMPTING TO QUIT You may want to talk about your answers with your caregiver.  Why do you want to quit?  If you tried to quit in the past, what helped and what did not?  What will be the most difficult situations for you after you quit? How will you plan to  handle them?  Who can help you through the tough times? Your family? Friends? A caregiver?  What pleasures do you get from smoking? What ways can you still get pleasure if you quit? Here are some questions to ask your caregiver:  How can you help me to be successful at quitting?  What medicine do you think would be best for me and how should I take it?  What should I do if I need more help?  What is smoking withdrawal like? How can I get information on withdrawal? GET READY  Set a quit date.  Change your environment by getting rid of all cigarettes, ashtrays, matches, and lighters in your home, car, or work. Do not let people smoke in your home.  Review your past attempts to quit. Think about what worked and what did not. GET SUPPORT AND ENCOURAGEMENT You have a better chance of being successful if you have help. You can get support in many ways.  Tell your family, friends, and co-workers that you are going to quit and need their support. Ask them not to smoke around you.  Get individual, group, or telephone counseling and support. Programs are available at local hospitals and health centers. Call your local health department for information about programs in your area.  Spiritual beliefs and practices may help some smokers quit.  Download a "quit meter" on your computer to keep track of quit statistics, such as how long you have gone without smoking, cigarettes not smoked, and money saved.  Get a self-help book about quitting smoking and staying off of tobacco. LEARN NEW SKILLS AND BEHAVIORS  Distract yourself from urges to smoke. Talk to someone, go for a walk, or occupy your time with a task.  Change your normal routine. Take a different route to work. Drink tea instead of coffee. Eat breakfast in a different place.  Reduce your stress. Take a hot bath, exercise, or read a book.  Plan something enjoyable to do every day. Reward yourself for not smoking.  Explore  interactive web-based programs that specialize in helping you quit. GET MEDICINE AND USE IT CORRECTLY Medicines can help you stop smoking and decrease the urge to smoke. Combining medicine with the above behavioral methods and support can greatly increase your chances of successfully quitting smoking.  Nicotine replacement therapy helps deliver nicotine to your body without the negative effects and risks of smoking. Nicotine replacement therapy includes nicotine gum, lozenges, inhalers, nasal sprays, and skin patches. Some may be available over-the-counter and others require a prescription.  Antidepressant medicine helps people abstain from smoking, but how this works is unknown. This medicine is available by prescription.    Nicotinic receptor partial agonist medicine simulates the effect of nicotine in your brain. This medicine is available by prescription. Ask your caregiver for advice about which medicines to use and how to use them based on your health history. Your caregiver will tell you what side effects to look out for if you choose to be on a medicine or therapy. Carefully read the information on the package. Do not use any other product containing nicotine while using a nicotine replacement product.  RELAPSE OR DIFFICULT SITUATIONS Most relapses occur within the first 3 months after quitting. Do not be discouraged if you start smoking again. Remember, most people try several times before finally quitting. You may have symptoms of withdrawal because your body is used to nicotine. You may crave cigarettes, be irritable, feel very hungry, cough often, get headaches, or have difficulty concentrating. The withdrawal symptoms are only temporary. They are strongest when you first quit, but they will go away within 10 14 days. To reduce the chances of relapse, try to:  Avoid drinking alcohol. Drinking lowers your chances of successfully quitting.  Reduce the amount of caffeine you consume. Once you  quit smoking, the amount of caffeine in your body increases and can give you symptoms, such as a rapid heartbeat, sweating, and anxiety.  Avoid smokers because they can make you want to smoke.  Do not let weight gain distract you. Many smokers will gain weight when they quit, usually less than 10 pounds. Eat a healthy diet and stay active. You can always lose the weight gained after you quit.  Find ways to improve your mood other than smoking. FOR MORE INFORMATION  www.smokefree.gov  Document Released: 02/03/2001 Document Revised: 08/11/2011 Document Reviewed: 05/21/2011 Exit[employer] Patient Information 2014 [employer].  

## 2013-05-19 NOTE — Progress Notes (Signed)
[facility] [place] HISTORY AND PHYSICAL -PAD  History of Present Illness April Branch is a 68 y.o. female patient of Dr. Oneida Alar status post left femoropopliteal bypass graft with propaten on 10/12/2011.  She returns today for carotid artery surveillance. States he had brain imaging about 5 years a go which demonstrated 3 TIA'S, has intermittent dizziness, has tingling and numbness in both hands, weakness in both hands, moreso in left.  Last carotid Duplex on file result was from 8/5/20013, showed 60-79% right ICA stenosis and 40-59% left ICA stenosis, requested by Dr. Burt Knack, pt does not recall this.  Her DM is uncontrolled and she continues to smoke.  She has known lumbar spine issues with radiculopathy in both legs.  She gets choked easily, has to drink through a straw.  She has dry cracked fissures on both heals, no infection, no drainage, no other non healing wounds., this is improving. Cramping in calves starts at night, is not worsened by walking. 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  She has intermittent dizziness, has tingling and numbness in both hands, weakness in both hands, moreso in left.  She had severe headaches several years ago which prompted brain imaging, states was found she had 3 ministrokes, she does not recall any TIA symptoms other than the headache, denies lateralizing symptoms.  The patient denies New Medical or Surgical History.  Pt Diabetic: Yes  Pt smoker: smoker (1/2 ppd x since age 50 yrs)  Pt meds include:  Statin :Yes  ASA: No  Other anticoagulants/antiplatelets: Plavix  Past Medical History  Diagnosis Date  . Hyperlipidemia   . Arthritis   . Hypothyroidism   . GERD (gastroesophageal reflux disease)   . Anxiety   . Diabetes mellitus age 34    insulin dependent  . Peripheral vascular disease   . Lumbar disc disease   . Insulin dependent type 2 diabetes mellitus, controlled   . GERD 07/20/2006   . CAD (coronary artery disease)   . CAD (coronary artery disease) of bypass graft   . Vertigo   . History of transient ischemic attack (TIA)   . Myocardial infarction     AGE 35    . Anginal pain   . Hypertension   . Depression   . COPD 12/14/2008    PATIENT DENIES    . Stroke     3 MINI STROKES  RT SIDED WEAKNESS  . Personal history of malignant neoplasm of kidney(V10.52) 12/14/2008    Laparoscopic biopsy and cryoablation 7/08 Dr. Vernie Shanks   . Headache(784.0)   . Neuromuscular disorder     PERIPHERAL NEUROPATHY  . Anemia   . 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 Diabetic coma Feb. 2014    Social History History  Substance Use Topics  . Smoking status: Former Smoker -- 0.50 packs/day for 50 years    Types: Cigarettes    Quit date: 07/12/2011  . Smokeless tobacco: Never Used  . Alcohol Use: No    Family History Family History  Problem Relation Age of Onset  . Heart disease Mother   . Heart disease Father   . Heart disease Sister   . Lung cancer Mother   . Lung cancer Father     Past Surgical History  Procedure Laterality Date  . Appendectomy    . Abdominal hysterectomy    . Knee surgery    . Cholecystectomy    .  Lung surgery      lung nodule removed from the right side  . Laparascopic cryoablation of left kidney  08/2006    Dr. Gladis Riffle for renal cell cancer  . Bladder suspension    . Shoulder surgery    . Foot surgery    . Pr vein bypass graft,aorto-fem-pop  05/27/2010  . Cardiac catheterization    . Femoral-popliteal bypass graft  10/12/2011    Procedure: BYPASS GRAFT FEMORAL-POPLITEAL ARTERY;  Surgeon: Elam Dutch, MD;  Location: [facility] OR;  Service: Vascular;  Laterality: Left;  Left Femoral-Popliteal Bypass Graft using 34mm x 80cm Propaten Graft with intraop arteriogram times one.  . Intraoperative arteriogram  10/12/2011    Procedure: INTRA OPERATIVE ARTERIOGRAM;  Surgeon: Elam Dutch, MD;  Location: Home;  Service: Vascular;  Laterality:  Left;    Allergies  Allergen Reactions  . Niacin-Simvastatin Er Hives    Current Outpatient Prescriptions  Medication Sig Dispense Refill  . Alcohol Swabs (B-D SINGLE USE SWABS REGULAR) PADS       . amitriptyline (ELAVIL) 75 MG tablet Take 75 mg by mouth at bedtime.      . clopidogrel (PLAVIX) 75 MG tablet Take 75 mg by mouth every morning.      . cyclobenzaprine (FLEXERIL) 10 MG tablet at bedtime as needed.      . DULoxetine (CYMBALTA) 30 MG capsule Take 1 capsule (30 mg total) by mouth daily.  30 capsule  0  . feeding supplement (ENSURE COMPLETE) LIQD Take 237 mLs by mouth 2 (two) times daily between meals.      Marland Kitchen HYDROcodone-acetaminophen (NORCO) 10-325 MG per tablet Take 1 tablet by mouth 3 (three) times daily as needed for pain.      Marland Kitchen insulin aspart (NOVOLOG) 100 UNIT/ML injection Inject 5-10 Units into the skin 3 (three) times daily with meals. Per sliding scale      . insulin glargine (LANTUS) 100 UNIT/ML injection Inject 0.1 mLs (10 Units total) into the skin at bedtime. Dispense syringes and needles in needed, dispense Lantus pen if approved by insurance.  10 mL  12  . lansoprazole (PREVACID) 30 MG capsule Take 30 mg by mouth every morning.      Marland Kitchen levothyroxine (SYNTHROID, LEVOTHROID) 88 MCG tablet Take 88 mcg by mouth daily before breakfast.      . loperamide (IMODIUM A-D) 2 MG tablet Take 4 mg by mouth daily as needed for diarrhea or loose stools.      Marland Kitchen LORazepam (ATIVAN) 1 MG tablet Take 3 mg by mouth at bedtime.      Marland Kitchen losartan (COZAAR) 100 MG tablet       . meclizine (ANTIVERT) 25 MG tablet continuous as needed.      . metFORMIN (GLUCOPHAGE) 850 MG tablet daily.      . metoprolol succinate (TOPROL-XL) 25 MG 24 hr tablet as needed.      . naproxen (NAPROSYN) 500 MG tablet as needed.      . pravastatin (PRAVACHOL) 20 MG tablet Take 20 mg by mouth daily.      . promethazine (PHENERGAN) 25 MG suppository Place 25 mg rectally every 6 (six) hours as needed for nausea.      Marland Kitchen  tolterodine (DETROL LA) 4 MG 24 hr capsule Take 4 mg by mouth every morning.      . minoxidil (LONITEN) 10 MG tablet       . [DISCONTINUED] Gabapentin (NEURONTIN PO) Take by mouth. New medicine, pt doesn't remember dosage  No current facility-administered medications for this visit.    ROS: See HPI for pertinent positives and negatives.   Physical Examination  Filed Vitals:   05/19/13 1146  BP: 108/67  Pulse: 79  Resp: 16   Filed Weights   05/19/13 1146  Weight: 179 lb (81.194 kg)   Body mass index is 32.73 kg/(m^2).  General: A&O x 3, WDWN, obese female.  Gait: normal  Eyes: PERRLA.  Pulmonary: CTAB, without wheezes , rales or rhonchi.  Cardiac: regular Rythm , without detected murmur.  Carotid Bruits  Left  Right    Negative  Negative   Aorta is not palpable.  Radial pulses: right is 1+ palpable, left is not present, left brachial pulse is 2+ palpable  VASCULAR EXAM:  Extremities without ischemic changes  without Gangrene; without open wounds.  LE Pulses  LEFT  RIGHT   FEMORAL  not palpable  not palpable   POPLITEAL  not palpable  not palpable   POSTERIOR TIBIAL  not palpable  not palpable   DORSALIS PEDIS  ANTERIOR TIBIAL  palpable  not palpable   Abdomen: soft, NT, no masses.  Skin: no rashes, no ulcers noted.  Musculoskeletal: no muscle wasting or atrophy.  Neurologic: A&O X 3; Appropriate Affect ; SENSATION: normal; MOTOR FUNCTION: moving all extremities equally, motor strength 5/5 throughout. Speech is fluent/normal. CN 2-12 intact except for uvula deviation to the right.   Non-Invasive Vascular Imaging: DATE: 05/19/2013  CEREBROVASCULAR DUPLEX EVALUATION    INDICATION: Carotid artery disease     PREVIOUS INTERVENTION(S):     DUPLEX EXAM:     RIGHT  LEFT  Peak Systolic Velocities (cm/s) End Diastolic Velocities (cm/s) Plaque LOCATION Peak Systolic Velocities (cm/s) End Diastolic Velocities (cm/s) Plaque  99 19  CCA PROXIMAL 127 24   107 20  CCA  MID 129 23   94 21  CCA DISTAL 101 28   111 11  ECA 110 16   94 26 HT ICA PROXIMAL 126 33 HT  74 22  ICA MID 107 33   77 22  ICA DISTAL 98 29     0.87 ICA / CCA Ratio (PSV) 0.97  Antegrade  Vertebral Flow Antegrade   784 Brachial Systolic Pressure (mmHg) 696  Triphasic  Brachial Artery Waveforms Triphasic     Plaque Morphology:  HM = Homogeneous, HT = Heterogeneous, CP = Calcific Plaque, SP = Smooth Plaque, IP = Irregular Plaque     ADDITIONAL FINDINGS:     IMPRESSION: Bilateral internal carotid artery velocities suggest a <40% stenosis.     Compared to the previous exam:  No prior exam performed at this facility for comparison.      ASSESSMENT: April Branch is a 68 y.o. female who is status post left femoropopliteal bypass graft with propaten on 10/12/2011.   Last carotid Duplex on file result was from 8/5/20013, showed 60-79% right ICA stenosis and 40-59% left ICA stenosis, requested by Dr. Burt Knack, pt does not recall this.  Carotid  Duplex today demonstrates <40% stenosis in both internal carotid arteries.  PLAN:  I discussed in depth with the patient the nature of atherosclerosis, and emphasized the importance of maximal medical management including strict control of blood pressure, blood glucose, and lipid levels, obtaining regular exercise, and cessation of smoking.  The patient is aware that without maximal medical management the underlying atherosclerotic disease process will progress, limiting the benefit of any interventions.  Based on the patient's vascular studies and examination, pt will  return to clinic in 6 months for ABI's and LLE arterial Duplex, already scheduled.  The patient was given information about PAD including signs, symptoms, treatment, what symptoms should prompt the patient to seek immediate medical care, and risk reduction measures to take.  Clemon Chambers, RN, MSN, FNP-C [facility] Phone:  [phone]  Clinic MD: Bridgett Larsson  05/19/2013 12:01 PM

## 2013-09-09 ENCOUNTER — Other Ambulatory Visit (HOSPITAL_COMMUNITY): Payer: Self-pay

## 2013-09-09 ENCOUNTER — Emergency Department (HOSPITAL_COMMUNITY): Payer: Medicare HMO

## 2013-09-09 ENCOUNTER — Emergency Department (HOSPITAL_COMMUNITY)
Admission: EM | Admit: 2013-09-09 | Discharge: 2013-09-09 | Disposition: A | Discharge status: Home / Self Care | Payer: Medicare HMO | Attending: Emergency Medicine | Admitting: Emergency Medicine

## 2013-09-09 ENCOUNTER — Encounter (HOSPITAL_COMMUNITY): Payer: Self-pay | Admitting: Emergency Medicine

## 2013-09-09 DIAGNOSIS — Z9889 Other specified postprocedural states: Secondary | ICD-10-CM | POA: Diagnosis not present

## 2013-09-09 DIAGNOSIS — Z794 Long term (current) use of insulin: Secondary | ICD-10-CM | POA: Diagnosis not present

## 2013-09-09 DIAGNOSIS — E860 Dehydration: Secondary | ICD-10-CM

## 2013-09-09 DIAGNOSIS — Z862 Personal history of diseases of the blood and blood-forming organs and certain disorders involving the immune mechanism: Secondary | ICD-10-CM | POA: Insufficient documentation

## 2013-09-09 DIAGNOSIS — K5289 Other specified noninfective gastroenteritis and colitis: Secondary | ICD-10-CM | POA: Diagnosis not present

## 2013-09-09 DIAGNOSIS — Z87891 Personal history of nicotine dependence: Secondary | ICD-10-CM | POA: Insufficient documentation

## 2013-09-09 DIAGNOSIS — E785 Hyperlipidemia, unspecified: Secondary | ICD-10-CM | POA: Insufficient documentation

## 2013-09-09 DIAGNOSIS — F411 Generalized anxiety disorder: Secondary | ICD-10-CM | POA: Insufficient documentation

## 2013-09-09 DIAGNOSIS — F3289 Other specified depressive episodes: Secondary | ICD-10-CM | POA: Insufficient documentation

## 2013-09-09 DIAGNOSIS — R197 Diarrhea, unspecified: Secondary | ICD-10-CM | POA: Insufficient documentation

## 2013-09-09 DIAGNOSIS — Z85528 Personal history of other malignant neoplasm of kidney: Secondary | ICD-10-CM | POA: Insufficient documentation

## 2013-09-09 DIAGNOSIS — E119 Type 2 diabetes mellitus without complications: Secondary | ICD-10-CM | POA: Diagnosis not present

## 2013-09-09 DIAGNOSIS — R1110 Vomiting, unspecified: Secondary | ICD-10-CM | POA: Diagnosis present

## 2013-09-09 DIAGNOSIS — F329 Major depressive disorder, single episode, unspecified: Secondary | ICD-10-CM | POA: Insufficient documentation

## 2013-09-09 DIAGNOSIS — J449 Chronic obstructive pulmonary disease, unspecified: Secondary | ICD-10-CM | POA: Diagnosis not present

## 2013-09-09 DIAGNOSIS — R1032 Left lower quadrant pain: Secondary | ICD-10-CM | POA: Diagnosis not present

## 2013-09-09 DIAGNOSIS — E039 Hypothyroidism, unspecified: Secondary | ICD-10-CM | POA: Diagnosis not present

## 2013-09-09 DIAGNOSIS — R509 Fever, unspecified: Secondary | ICD-10-CM | POA: Diagnosis not present

## 2013-09-09 DIAGNOSIS — Z8673 Personal history of transient ischemic attack (TIA), and cerebral infarction without residual deficits: Secondary | ICD-10-CM | POA: Insufficient documentation

## 2013-09-09 DIAGNOSIS — I10 Essential (primary) hypertension: Secondary | ICD-10-CM | POA: Insufficient documentation

## 2013-09-09 DIAGNOSIS — I2510 Atherosclerotic heart disease of native coronary artery without angina pectoris: Secondary | ICD-10-CM | POA: Diagnosis not present

## 2013-09-09 DIAGNOSIS — Z8669 Personal history of other diseases of the nervous system and sense organs: Secondary | ICD-10-CM | POA: Diagnosis not present

## 2013-09-09 DIAGNOSIS — I252 Old myocardial infarction: Secondary | ICD-10-CM | POA: Diagnosis not present

## 2013-09-09 DIAGNOSIS — K219 Gastro-esophageal reflux disease without esophagitis: Secondary | ICD-10-CM | POA: Diagnosis not present

## 2013-09-09 DIAGNOSIS — J4489 Other specified chronic obstructive pulmonary disease: Secondary | ICD-10-CM | POA: Insufficient documentation

## 2013-09-09 DIAGNOSIS — Z7902 Long term (current) use of antithrombotics/antiplatelets: Secondary | ICD-10-CM | POA: Insufficient documentation

## 2013-09-09 DIAGNOSIS — M129 Arthropathy, unspecified: Secondary | ICD-10-CM | POA: Diagnosis not present

## 2013-09-09 DIAGNOSIS — Z9861 Coronary angioplasty status: Secondary | ICD-10-CM | POA: Diagnosis not present

## 2013-09-09 DIAGNOSIS — Z79899 Other long term (current) drug therapy: Secondary | ICD-10-CM | POA: Diagnosis not present

## 2013-09-09 DIAGNOSIS — K529 Noninfective gastroenteritis and colitis, unspecified: Secondary | ICD-10-CM

## 2013-09-09 LAB — URINALYSIS, ROUTINE W REFLEX MICROSCOPIC
Bilirubin Urine: NEGATIVE
Glucose, UA: >1000 mg/dL — AB
Ketones, ur: NEGATIVE mg/dL
LEUKOCYTES UA: NEGATIVE
Nitrite: NEGATIVE
Protein, ur: NEGATIVE mg/dL
Specific Gravity, Urine: 1.022 (ref 1.005–1.030)
Urobilinogen, UA: 0.2 mg/dL (ref 0.0–1.0)
pH: 5.5 (ref 5.0–8.0)

## 2013-09-09 LAB — COMPREHENSIVE METABOLIC PANEL
ALBUMIN: 3.6 g/dL (ref 3.5–5.2)
ALT: 15 U/L (ref 0–35)
AST: 20 U/L (ref 0–37)
Alkaline Phosphatase: 109 U/L (ref 39–117)
Anion gap: 21 — ABNORMAL HIGH (ref 5–15)
BUN: 20 mg/dL (ref 6–23)
CHLORIDE: 92 meq/L — AB (ref 96–112)
CO2: 21 mEq/L (ref 19–32)
CREATININE: 1.06 mg/dL (ref 0.50–1.10)
Calcium: 9 mg/dL (ref 8.4–10.5)
GFR calc Af Amer: 62 mL/min — ABNORMAL LOW (ref >90)
GFR calc non Af Amer: 53 mL/min — ABNORMAL LOW (ref >90)
Glucose, Bld: 285 mg/dL — ABNORMAL HIGH (ref 70–99)
Potassium: 3.8 mEq/L (ref 3.7–5.3)
Sodium: 134 mEq/L — ABNORMAL LOW (ref 137–147)
Total Bilirubin: 0.3 mg/dL (ref 0.3–1.2)
Total Protein: 7.8 g/dL (ref 6.0–8.3)

## 2013-09-09 LAB — CBC WITH DIFFERENTIAL/PLATELET
BASOS ABS: 0 10*3/uL (ref 0.0–0.1)
BASOS PCT: 0 % (ref 0–1)
Eosinophils Absolute: 0.2 10*3/uL (ref 0.0–0.7)
Eosinophils Relative: 2 % (ref 0–5)
HCT: 38.3 % (ref 36.0–46.0)
Hemoglobin: 13 g/dL (ref 12.0–15.0)
Lymphocytes Relative: 19 % (ref 12–46)
Lymphs Abs: 1.7 10*3/uL (ref 0.7–4.0)
MCH: 29.4 pg (ref 26.0–34.0)
MCHC: 33.9 g/dL (ref 30.0–36.0)
MCV: 86.7 fL (ref 78.0–100.0)
MONO ABS: 0.4 10*3/uL (ref 0.1–1.0)
Monocytes Relative: 4 % (ref 3–12)
NEUTROS ABS: 6.7 10*3/uL (ref 1.7–7.7)
Neutrophils Relative %: 75 % (ref 43–77)
PLATELETS: 283 10*3/uL (ref 150–400)
RBC: 4.42 MIL/uL (ref 3.87–5.11)
RDW: 13.9 % (ref 11.5–15.5)
WBC: 8.9 10*3/uL (ref 4.0–10.5)

## 2013-09-09 LAB — URINE MICROSCOPIC-ADD ON

## 2013-09-09 LAB — I-STAT TROPONIN, ED: TROPONIN I, POC: 0 ng/mL (ref 0.00–0.08)

## 2013-09-09 LAB — I-STAT CG4 LACTIC ACID, ED
LACTIC ACID, VENOUS: 2.37 mmol/L — AB (ref 0.5–2.2)
LACTIC ACID, VENOUS: 1.07 mmol/L (ref 0.5–2.2)

## 2013-09-09 LAB — LIPASE, BLOOD: LIPASE: 22 U/L (ref 11–59)

## 2013-09-09 MED ORDER — SODIUM CHLORIDE 0.9 % IV BOLUS (SEPSIS)
1000.0000 mL | Freq: Once | INTRAVENOUS | Status: AC
  Administered 2013-09-09: 1000 mL via INTRAVENOUS

## 2013-09-09 MED ORDER — MORPHINE SULFATE 4 MG/ML IJ SOLN
4.0000 mg | Freq: Once | INTRAMUSCULAR | Status: AC
  Administered 2013-09-09: 4 mg via INTRAVENOUS
  Filled 2013-09-09: qty 1

## 2013-09-09 MED ORDER — IOHEXOL 300 MG/ML  SOLN
100.0000 mL | Freq: Once | INTRAMUSCULAR | Status: AC | PRN
  Administered 2013-09-09: 100 mL via INTRAVENOUS

## 2013-09-09 MED ORDER — PROMETHAZINE HCL 25 MG PO TABS: 25.0000 mg | ORAL_TABLET | Freq: Four times a day (QID) | ORAL | Status: DC | PRN

## 2013-09-09 MED ORDER — DIPHENHYDRAMINE HCL 50 MG/ML IJ SOLN
12.5000 mg | Freq: Once | INTRAMUSCULAR | Status: AC
  Administered 2013-09-09: 12.5 mg via INTRAVENOUS
  Filled 2013-09-09: qty 1

## 2013-09-09 MED ORDER — DIPHENHYDRAMINE HCL 25 MG PO TABS: 50.0000 mg | ORAL_TABLET | ORAL | Status: DC | PRN

## 2013-09-09 MED ORDER — PROMETHAZINE HCL 25 MG RE SUPP: 25.0000 mg | Freq: Four times a day (QID) | RECTAL | Status: DC | PRN

## 2013-09-09 MED ORDER — SODIUM CHLORIDE 0.9 % IV SOLN
Freq: Once | INTRAVENOUS | Status: AC
  Administered 2013-09-09: 17:00:00 via INTRAVENOUS

## 2013-09-09 MED ORDER — ONDANSETRON HCL 4 MG/2ML IJ SOLN
4.0000 mg | Freq: Once | INTRAMUSCULAR | Status: AC
  Administered 2013-09-09: 4 mg via INTRAVENOUS
  Filled 2013-09-09: qty 2

## 2013-09-09 NOTE — ED Notes (Signed)
Reports having n/v/d since Thursday. Pt reports having near syncope when she tries to stand up and was told by pcp to come to ED for dehydration.

## 2013-09-09 NOTE — ED Provider Notes (Signed)
CSN: [id number]     Arrival date & time 09/09/13  1159 History   First MD Initiated Contact with Patient 09/09/13 1218     Chief Complaint  Patient presents with  . Emesis     (Consider location/radiation/quality/duration/timing/severity/associated sxs/prior Treatment) Patient is a 68 y.o. female presenting with vomiting. The history is provided by the patient. No language interpreter was used.  Emesis Severity:  Severe Duration:  3 days Timing:  Constant Number of daily episodes:  5-6 a day Quality:  Stomach contents Progression:  Unchanged Chronicity:  New Recent urination:  Decreased Relieved by:  Nothing Worsened by:  Food smell, ice chips and liquids Associated symptoms: abdominal pain, chills, diarrhea and fever   Associated symptoms: no arthralgias, no headaches and no sore throat   Abdominal pain:    Location:  Suprapubic and LLQ   Quality:  Aching and sharp   Severity:  Moderate   Duration:  1 day   Timing:  Intermittent   Progression:  Waxing and waning   Chronicity:  New Diarrhea:    Quality:  Semi-solid   Number of occurrences:  >10   Severity:  Moderate   Duration:  3 days   Timing:  Intermittent   Progression:  Improving Fever:    Duration:  3 days   Timing:  Intermittent   Temp source:  Subjective   Progression:  Waxing and waning Risk factors: prior abdominal surgery   Risk factors: no alcohol use, no diabetes, not pregnant now, no sick contacts, no suspect food intake and no travel to endemic areas     Past Medical History  Diagnosis Date  . Hyperlipidemia   . Arthritis   . Hypothyroidism   . GERD (gastroesophageal reflux disease)   . Anxiety   . Diabetes mellitus age 76    insulin dependent  . Peripheral vascular disease   . Lumbar disc disease   . Insulin dependent type 2 diabetes mellitus, controlled   . GERD 07/20/2006  . CAD (coronary artery disease)   . CAD (coronary artery disease) of bypass graft   . Vertigo   . History of  transient ischemic attack (TIA)   . Myocardial infarction     AGE 79    . Anginal pain   . Hypertension   . Depression   . COPD 12/14/2008    PATIENT DENIES    . Stroke     3 MINI STROKES  RT SIDED WEAKNESS  . Personal history of malignant neoplasm of kidney(V10.52) 12/14/2008    Laparoscopic biopsy and cryoablation 7/08 Dr. Vernie Shanks   . Headache(784.0)   . Neuromuscular disorder     PERIPHERAL NEUROPATHY  . Anemia   . 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 Diabetic coma Feb. 2014   Past Surgical History  Procedure Laterality Date  . Appendectomy    . Abdominal hysterectomy    . Knee surgery    . Cholecystectomy    . Lung surgery      lung nodule removed from the right side  . Laparascopic cryoablation of left kidney  08/2006    Dr. Gladis Riffle for renal cell cancer  . Bladder suspension    . Shoulder surgery    . Foot surgery    . Pr vein bypass graft,aorto-fem-pop  05/27/2010  . Cardiac catheterization    . Femoral-popliteal bypass graft  10/12/2011    Procedure: BYPASS GRAFT FEMORAL-POPLITEAL ARTERY;  Surgeon: Elam Dutch, MD;  Location: [facility] OR;  Service: Vascular;  Laterality: Left;  Left Femoral-Popliteal Bypass Graft using 58mm x 80cm Propaten Graft with intraop arteriogram times one.  . Intraoperative arteriogram  10/12/2011    Procedure: INTRA OPERATIVE ARTERIOGRAM;  Surgeon: Elam Dutch, MD;  Location: [facility] OR;  Service: Vascular;  Laterality: Left;   Family History  Problem Relation Age of Onset  . Heart disease Mother   . Heart disease Father   . Heart disease Sister   . Lung cancer Mother   . Lung cancer Father    History  Substance Use Topics  . Smoking status: Former Smoker -- 0.50 packs/day for 50 years    Types: Cigarettes    Quit date: 07/12/2011  . Smokeless tobacco: Never Used  . Alcohol Use: No   OB History   Grav Para Term Preterm Abortions TAB SAB Ect Mult Living                 Review of Systems  Constitutional: Positive for  chills. Negative for fever, diaphoresis, activity change, appetite change and fatigue.  HENT: Negative for congestion, facial swelling, rhinorrhea and sore throat.   Eyes: Negative for photophobia and discharge.  Respiratory: Negative for cough, chest tightness and shortness of breath.   Cardiovascular: Negative for chest pain, palpitations and leg swelling.  Gastrointestinal: Positive for vomiting, abdominal pain and diarrhea. Negative for nausea.  Endocrine: Negative for polydipsia and polyuria.  Genitourinary: Negative for dysuria, frequency, difficulty urinating and pelvic pain.  Musculoskeletal: Negative for arthralgias, back pain, neck pain and neck stiffness.  Skin: Negative for color change and wound.  Allergic/Immunologic: Negative for immunocompromised state.  Neurological: Negative for facial asymmetry, weakness, numbness and headaches.  Hematological: Does not bruise/bleed easily.  Psychiatric/Behavioral: Negative for confusion and agitation.      Allergies  Review of patient's allergies indicates no active allergies.  Home Medications   Prior to Admission medications   Medication Sig Start Date End Date Taking? Authorizing Provider  amitriptyline (ELAVIL) 75 MG tablet Take 75 mg by mouth at bedtime.   Yes Historical Provider, MD  amLODipine (NORVASC) 10 MG tablet Take 10 mg by mouth daily.  08/22/13  Yes Historical Provider, MD  cloNIDine (CATAPRES) 0.3 MG tablet Take 0.3 mg by mouth daily.  08/22/13  Yes Historical Provider, MD  clopidogrel (PLAVIX) 75 MG tablet Take 75 mg by mouth daily.    Yes Historical Provider, MD  cyclobenzaprine (FLEXERIL) 10 MG tablet Take 10 mg by mouth at bedtime as needed for muscle spasms.  03/13/13  Yes Historical Provider, MD  diphenhydrAMINE (BENADRYL) 25 MG tablet Take 25 mg by mouth every 6 (six) hours as needed for itching or allergies.   Yes Historical Provider, MD  DULoxetine (CYMBALTA) 30 MG capsule Take 30 mg by mouth at bedtime.   Yes  Historical Provider, MD  hydrALAZINE (APRESOLINE) 25 MG tablet Take 25 mg by mouth 2 (two) times daily.  08/22/13  Yes Historical Provider, MD  hydrochlorothiazide (HYDRODIURIL) 25 MG tablet Take 25 mg by mouth daily.  07/10/13  Yes Historical Provider, MD  HYDROcodone-acetaminophen (NORCO) 10-325 MG per tablet Take 1 tablet by mouth every 4 (four) hours as needed (pain).    Yes Historical Provider, MD  insulin aspart (NOVOLOG) 100 UNIT/ML injection Inject 5-10 Units into the skin 3 (three) times daily as needed for high blood sugar (cbg over 200). Per sliding scale   Yes Historical Provider, MD  insulin glargine (LANTUS) 100 UNIT/ML injection Inject 80  Units into the skin at bedtime. Dispense syringes and needles in needed, dispense Lantus pen if approved by insurance. 09/23/12  Yes Thurnell Lose, MD  lansoprazole (PREVACID) 30 MG capsule Take 30 mg by mouth daily.    Yes Historical Provider, MD  levothyroxine (SYNTHROID, LEVOTHROID) 88 MCG tablet Take 88 mcg by mouth daily before breakfast.   Yes Historical Provider, MD  LORazepam (ATIVAN) 1 MG tablet Take 3 mg by mouth at bedtime.   Yes Historical Provider, MD  pravastatin (PRAVACHOL) 20 MG tablet Take 20 mg by mouth daily.   Yes Historical Provider, MD  tolterodine (DETROL LA) 4 MG 24 hr capsule Take 4 mg by mouth daily.    Yes Historical Provider, MD  Alcohol Swabs (B-D SINGLE USE SWABS REGULAR) PADS  03/21/13   Historical Provider, MD  diphenhydrAMINE (BENADRYL) 25 MG tablet Take 2 tablets (50 mg total) by mouth every 4 (four) hours as needed for itching. 09/09/13   Babette Relic, MD  promethazine (PHENERGAN) 25 MG suppository Place 1 suppository (25 mg total) rectally every 6 (six) hours as needed for nausea or vomiting. 09/09/13   Babette Relic, MD  promethazine (PHENERGAN) 25 MG tablet Take 1 tablet (25 mg total) by mouth every 6 (six) hours as needed for nausea or vomiting. 09/09/13   Babette Relic, MD   BP 118/72  Pulse 81  Temp(Src) 98.1 F  (36.7 C) (Oral)  Resp 24  SpO2 97% Physical Exam  Constitutional: She is oriented to person, place, and time. She appears well-developed and well-nourished. No distress.  HENT:  Head: Normocephalic and atraumatic.  Mouth/Throat: No oropharyngeal exudate.  Eyes: Pupils are equal, round, and reactive to light.  Neck: Normal range of motion. Neck supple.  Cardiovascular: Normal rate, regular rhythm and normal heart sounds.  Exam reveals no gallop and no friction rub.   No murmur heard. Pulmonary/Chest: Effort normal and breath sounds normal. No respiratory distress. She has no wheezes. She has no rales.  Abdominal: Soft. Bowel sounds are normal. She exhibits no distension and no mass. There is tenderness in the suprapubic area and left lower quadrant. There is no rebound and no guarding.  Musculoskeletal: Normal range of motion. She exhibits no edema and no tenderness.  Neurological: She is alert and oriented to person, place, and time.  Skin: Skin is warm and dry.  Psychiatric: She has a normal mood and affect.    ED Course  Procedures (including critical care time) Labs Review Labs Reviewed  COMPREHENSIVE METABOLIC PANEL - Abnormal; Notable for the following:    Sodium 134 (*)    Chloride 92 (*)    Glucose, Bld 285 (*)    GFR calc non Af Amer 53 (*)    GFR calc Af Amer 62 (*)    Anion gap 21 (*)    All other components within normal limits  URINALYSIS, ROUTINE W REFLEX MICROSCOPIC - Abnormal; Notable for the following:    APPearance CLOUDY (*)    Glucose, UA >1000 (*)    Hgb urine dipstick SMALL (*)    All other components within normal limits  URINE MICROSCOPIC-ADD ON - Abnormal; Notable for the following:    Squamous Epithelial / LPF MANY (*)    Bacteria, UA MANY (*)    All other components within normal limits  I-STAT CG4 LACTIC ACID, ED - Abnormal; Notable for the following:    Lactic Acid, Venous 2.37 (*)    All other components within normal  limits  CBC WITH  DIFFERENTIAL  LIPASE, BLOOD  I-STAT TROPOININ, ED  I-STAT CG4 LACTIC ACID, ED    Imaging Review Ct Abdomen Pelvis W Contrast  09/09/2013   CLINICAL DATA:  Nausea and vomiting.  Diarrhea.  Near syncope.  EXAM: CT ABDOMEN AND PELVIS WITH CONTRAST  TECHNIQUE: Multidetector CT imaging of the abdomen and pelvis was performed using the standard protocol following bolus administration of intravenous contrast.  CONTRAST:  153mL OMNIPAQUE IOHEXOL 300 MG/ML  SOLN  COMPARISON:  09/22/2012  FINDINGS: Surgical clips from prior cholecystectomy noted. No evidence of biliary ductal dilatation. The liver, pancreas, spleen, adrenal glands are normal in appearance. Bilateral renal parenchymal scarring is again demonstrated as well several tiny calculi in the midpole left kidney. No evidence of renal masses or hydronephrosis.  Prior hysterectomy noted. Adnexal regions are unremarkable in appearance. No soft tissue masses or lymphadenopathy identified within the abdomen or pelvis.  Diverticulosis is seen mainly involving the sigmoid colon. No evidence of diverticulitis. No other inflammatory process or abnormal fluid collections are identified. No evidence of dilated bowel loops. No suspicious bone lesions identified.  IMPRESSION: No acute findings.  Stable mild bilateral renal parenchymal scarring and nonobstructive left nephrolithiasis.  Sigmoid Diverticulosis. No radiographic evidence of diverticulitis.   Electronically Signed   By: Earle Gell M.D.   On: 09/09/2013 16:00     EKG Interpretation   Date/Time:  Saturday September 09 2013 12:14:35 EDT Ventricular Rate:  81 PR Interval:  170 QRS Duration: 93 QT Interval:  371 QTC Calculation: 431 R Axis:   86 Text Interpretation:  Sinus rhythm S1,S2,S3 pattern Low voltage,  precordial leads Nonspecific T abnormalities, diffuse leads No significant  change since last tracing Confirmed by [facility] ([id number]) on  09/09/2013 12:31:49 PM      MDM   Final  diagnoses:  Gastroenteritis  Dehydration    Pt is a 68 y.o. female with Pmhx as above who presents with 3 days of n/v/d, intermittent fevers, suprapubic and LLQ ab pain. She has positive orthostatics.  W/u revealed elevated LA which along w/ abdominal exam prompted CT ab pelvis which had no acute findings.  Pt given 2L NS, IV zofran, and two doses of IV morphine. Suspect viral gastroenteritis leading to dehydration. Plan is to repeat LA, PO challenge and ambulate prior to d/c. WIll admit if unable to accomplish.  Care transferred to Dr. Stevie Kern who will f/u on LA.         Neta Ehlers, MD 09/09/13 2041

## 2013-09-09 NOTE — ED Notes (Signed)
PT comfortable with discharge and follow up instructions. Prescriptions x3.

## 2013-09-09 NOTE — ED Notes (Signed)
Lactic Acid 1.07 mmol/L

## 2013-09-09 NOTE — ED Notes (Signed)
Dr. Tawnya Crook made aware of Orthostatic VS results.

## 2013-09-09 NOTE — ED Notes (Signed)
Pt ambulatory in room without issue.

## 2013-09-09 NOTE — ED Notes (Signed)
Pt now reports she has had generalized itchiness since yesterday. EDP at bedside and aware.

## 2013-09-09 NOTE — ED Provider Notes (Signed)
I stood the patient up the bedside she was able to walk a few steps without lightheadedness with repeat systolic blood pressure 709 while standing and patient wants to go home. 1850  April Relic, MD 09/10/13 1329

## 2013-09-09 NOTE — ED Notes (Addendum)
Pt tolerating PO fluids without issue. 

## 2013-09-09 NOTE — Discharge Instructions (Signed)
Viral Gastroenteritis °Viral gastroenteritis is also known as stomach flu. This condition affects the stomach and intestinal tract. It can cause sudden diarrhea and vomiting. The illness typically lasts 3 to 8 days. Most people develop an immune response that eventually gets rid of the virus. While this natural response develops, the virus can make you quite ill. °CAUSES  °Many different viruses can cause gastroenteritis, such as rotavirus or noroviruses. You can catch one of these viruses by consuming contaminated food or water. You may also catch a virus by sharing utensils or other personal items with an infected person or by touching a contaminated surface. °SYMPTOMS  °The most common symptoms are diarrhea and vomiting. These problems can cause a severe loss of body fluids (dehydration) and a body salt (electrolyte) imbalance. Other symptoms may include: °· Fever. °· Headache. °· Fatigue. °· Abdominal pain. °DIAGNOSIS  °Your caregiver can usually diagnose viral gastroenteritis based on your symptoms and a physical exam. A stool sample may also be taken to test for the presence of viruses or other infections. °TREATMENT  °This illness typically goes away on its own. Treatments are aimed at rehydration. The most serious cases of viral gastroenteritis involve vomiting so severely that you are not able to keep fluids down. In these cases, fluids must be given through an intravenous line (IV). °HOME CARE INSTRUCTIONS  °· Drink enough fluids to keep your urine clear or pale yellow. Drink small amounts of fluids frequently and increase the amounts as tolerated. °· Ask your caregiver for specific rehydration instructions. °· Avoid: °¨ Foods high in sugar. °¨ Alcohol. °¨ Carbonated drinks. °¨ Tobacco. °¨ Juice. °¨ Caffeine drinks. °¨ Extremely hot or cold fluids. °¨ Fatty, greasy foods. °¨ Too much intake of anything at one time. °¨ Dairy products until 24 to 48 hours after diarrhea stops. °· You may consume probiotics.  Probiotics are active cultures of beneficial bacteria. They may lessen the amount and number of diarrheal stools in adults. Probiotics can be found in yogurt with active cultures and in supplements. °· Wash your hands well to avoid spreading the virus. °· Only take over-the-counter or prescription medicines for pain, discomfort, or fever as directed by your caregiver. Do not give aspirin to children. Antidiarrheal medicines are not recommended. °· Ask your caregiver if you should continue to take your regular prescribed and over-the-counter medicines. °· Keep all follow-up appointments as directed by your caregiver. °SEEK IMMEDIATE MEDICAL CARE IF:  °· You are unable to keep fluids down. °· You do not urinate at least once every 6 to 8 hours. °· You develop shortness of breath. °· You notice blood in your stool or vomit. This may look like coffee grounds. °· You have abdominal pain that increases or is concentrated in one small area (localized). °· You have persistent vomiting or diarrhea. °· You have a fever. °· The patient is a child younger than 3 months, and he or she has a fever. °· The patient is a child older than 3 months, and he or she has a fever and persistent symptoms. °· The patient is a child older than 3 months, and he or she has a fever and symptoms suddenly get worse. °· The patient is a baby, and he or she has no tears when crying. °MAKE SURE YOU:  °· Understand these instructions. °· Will watch your condition. °· Will get help right away if you are not doing well or get worse. °Document Released: 02/09/2005 Document Revised: 05/04/2011 Document Reviewed: 11/26/2010 °  Adult Dehydration is when you lose more fluids from the body than you take in. Vital organs like the kidneys, brain, and heart cannot function without a proper amount of  fluids and salt. Any loss of fluids from the body can cause dehydration.  CAUSES   Vomiting.  Diarrhea.  Excessive sweating.  Excessive urine output.  Fever. SYMPTOMS  Mild dehydration  Thirst.  Dry lips.  Slightly dry mouth. Moderate dehydration  Very dry mouth.  Sunken eyes.  Skin does not bounce back quickly when lightly pinched and released.  Dark urine and decreased urine production.  Decreased tear production.  Headache. Severe dehydration  Very dry mouth.  Extreme thirst.  Rapid, weak pulse (more than 100 beats per minute at rest).  Cold hands and feet.  Not able to sweat in spite of heat and temperature.  Rapid breathing.  Blue lips.  Confusion and lethargy.  Difficulty being awakened.  Minimal urine production.  No tears. DIAGNOSIS  Your caregiver will diagnose dehydration based on your symptoms and your exam. Blood and urine tests will help confirm the diagnosis. The diagnostic evaluation should also identify the cause of dehydration. TREATMENT  Treatment of mild or moderate dehydration can often be done at home by increasing the amount of fluids that you drink. It is best to drink small amounts of fluid more often. Drinking too much at one time can make vomiting worse. Refer to the home care instructions below. Severe dehydration needs to be treated at the hospital where you will probably be given intravenous (IV) fluids that contain water and electrolytes. HOME CARE INSTRUCTIONS   Ask your caregiver about specific rehydration instructions.  Drink enough fluids to keep your urine clear or pale yellow.  Drink small amounts frequently if you have nausea and vomiting.  Eat as you normally do.  Avoid:  Foods or drinks high in sugar.  Carbonated drinks.  Juice.  Extremely hot or cold fluids.  Drinks with caffeine.  Fatty, greasy foods.  Alcohol.  Tobacco.  Overeating.  Gelatin desserts.  Wash your hands well to avoid  spreading bacteria and viruses.  Only take over-the-counter or prescription medicines for pain, discomfort, or fever as directed by your caregiver.  Ask your caregiver if you should continue all prescribed and over-the-counter medicines.  Keep all follow-up appointments with your caregiver. SEEK MEDICAL CARE IF:  You have abdominal pain and it increases or stays in one area (localizes).  You have a rash, stiff neck, or severe headache.  You are irritable, sleepy, or difficult to awaken.  You are weak, dizzy, or extremely thirsty. SEEK IMMEDIATE MEDICAL CARE IF:   You are unable to keep fluids down or you get worse despite treatment.  You have frequent episodes of vomiting or diarrhea.  You have blood or green matter (bile) in your vomit.  You have blood in your stool or your stool looks black and tarry.  You have not urinated in 6 to 8 hours, or you have only urinated a small amount of very dark urine.  You have a fever.  You faint. MAKE SURE YOU:   Understand these instructions.  Will watch your condition.  Will get help right away if you are not doing well or get worse. Document Released: 02/09/2005 Document Revised: 05/04/2011 Document Reviewed: 09/29/2010 Exit[employer] Patient Information 2015 [employer]. This information is not intended to replace advice given to you by your health care provider. Make sure you discuss any questions you have with your health care   provider.  

## 2013-09-09 NOTE — ED Notes (Signed)
Dr. Bednar at bedside. 

## 2013-09-09 NOTE — ED Notes (Signed)
Lactic acid 2.37 mmol/L given to Dr. Tawnya Crook

## 2013-11-01 ENCOUNTER — Encounter: Payer: Self-pay | Admitting: Family

## 2013-11-02 ENCOUNTER — Ambulatory Visit (INDEPENDENT_AMBULATORY_CARE_PROVIDER_SITE_OTHER)
Admission: RE | Admit: 2013-11-02 | Discharge: 2013-11-02 | Disposition: A | Discharge status: Home / Self Care | Payer: Medicare HMO | Source: Ambulatory Visit | Attending: Family | Admitting: Family

## 2013-11-02 ENCOUNTER — Encounter: Payer: Self-pay | Admitting: Family

## 2013-11-02 ENCOUNTER — Ambulatory Visit (HOSPITAL_COMMUNITY)
Admission: RE | Admit: 2013-11-02 | Discharge: 2013-11-02 | Disposition: A | Discharge status: Home / Self Care | Payer: Medicare HMO | Source: Ambulatory Visit | Attending: Family | Admitting: Family

## 2013-11-02 ENCOUNTER — Ambulatory Visit (INDEPENDENT_AMBULATORY_CARE_PROVIDER_SITE_OTHER): Payer: Medicare HMO | Admitting: Family

## 2013-11-02 VITALS — BP 121/68 | HR 67 | Resp 16 | Ht 61.0 in | Wt 173.0 lb

## 2013-11-02 DIAGNOSIS — I739 Peripheral vascular disease, unspecified: Secondary | ICD-10-CM | POA: Insufficient documentation

## 2013-11-02 DIAGNOSIS — E119 Type 2 diabetes mellitus without complications: Secondary | ICD-10-CM | POA: Diagnosis not present

## 2013-11-02 DIAGNOSIS — F17200 Nicotine dependence, unspecified, uncomplicated: Secondary | ICD-10-CM | POA: Diagnosis not present

## 2013-11-02 DIAGNOSIS — E785 Hyperlipidemia, unspecified: Secondary | ICD-10-CM | POA: Insufficient documentation

## 2013-11-02 DIAGNOSIS — Z48812 Encounter for surgical aftercare following surgery on the circulatory system: Secondary | ICD-10-CM

## 2013-11-02 DIAGNOSIS — I10 Essential (primary) hypertension: Secondary | ICD-10-CM | POA: Insufficient documentation

## 2013-11-02 NOTE — Addendum Note (Signed)
Addended by: Mena Goes on: 11/02/2013 05:17 PM   Modules accepted: Orders

## 2013-11-02 NOTE — Progress Notes (Signed)
[facility]  HISTORY AND PHYSICAL -PAD  History of Present Illness April Branch is a 68 y.o. female patient of Dr. Oneida Alar who is status post left femoropopliteal bypass graft with propaten on 10/12/2011.  She also has a history of carotid artery stenosis.   She returns today for PAD surveillance.  She walks 3 blocks daily, her feet and left calf hurt, not relieved by rest; denies non healing wounds.  States she had brain imaging about 2010 which demonstrated 3 TIA'S, has intermittent dizziness, has tingling and numbness in both hands, weakness in both hands, moreso in left.  Carotid Duplex on file result was from 8/5/20013, showed 60-79% right ICA stenosis and 40-59% left ICA stenosis, requested by Dr. Burt Knack, pt does not recall this.  March, 2015 carotid Duplex demonstrated <40% stenosis in both internal carotid arteries.  Her DM is uncontrolled and she continues to smoke.  She has known lumbar spine issues with radiculopathy in both legs.  She gets choked easily, has to drink through a straw.  She has dry cracked fissures on both heals, no infection, no drainage, no other non healing wounds., this is improving.  Cramping in calves starts at night, is not worsened by walking.  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  She has intermittent dizziness, has tingling and numbness in both hands, weakness in both hands, moreso in left.  She had severe headaches several years ago which prompted brain imaging, states was found she had 3 ministrokes, she does not recall any TIA symptoms other than the headache, denies lateralizing symptoms.  She was taken off Flexeril due to her vertigo, and her legs ache more when resting and walking. She was hospitalized recently at [facility] for dehydration, does not know how she got dehydrated.  She denies cough or dyspnea, states her sinuses remain "stopped up".  Pt Diabetic: Yes, uncontrolled, this morning her  glucose was 410. Pt smoker: smoker (1/2 ppd decreased to 2 cigarettes/day, started at age 71 yrs)  Pt meds include:  Statin :Yes  ASA: No  Other anticoagulants/antiplatelets: Plavix    Past Medical History  Diagnosis Date  . Hyperlipidemia   . Arthritis   . Hypothyroidism   . GERD (gastroesophageal reflux disease)   . Anxiety   . Diabetes mellitus age 48    insulin dependent  . Peripheral vascular disease   . Lumbar disc disease   . Insulin dependent type 2 diabetes mellitus, controlled   . GERD 07/20/2006  . CAD (coronary artery disease)   . CAD (coronary artery disease) of bypass graft   . Vertigo   . History of transient ischemic attack (TIA)   . Myocardial infarction     AGE 45    . Anginal pain   . Hypertension   . Depression   . COPD 12/14/2008    PATIENT DENIES    . Stroke     3 MINI STROKES  RT SIDED WEAKNESS  . Personal history of malignant neoplasm of kidney(V10.52) 12/14/2008    Laparoscopic biopsy and cryoablation 7/08 Dr. Vernie Shanks   . Headache(784.0)   . Neuromuscular disorder     PERIPHERAL NEUROPATHY  . Anemia   . 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 Diabetic coma Feb. 2014    Social History History  Substance Use Topics  . Smoking status: Former Smoker -- 0.50 packs/day for 50 years    Types: Cigarettes  Quit date: 07/12/2011  . Smokeless tobacco: Never Used  . Alcohol Use: No    Family History Family History  Problem Relation Age of Onset  . Heart disease Mother   . Heart disease Father   . Heart disease Sister   . Lung cancer Mother   . Lung cancer Father     Past Surgical History  Procedure Laterality Date  . Appendectomy    . Abdominal hysterectomy    . Knee surgery    . Cholecystectomy    . Lung surgery      lung nodule removed from the right side  . Laparascopic cryoablation of left kidney  08/2006    Dr. Gladis Riffle for renal cell cancer  . Bladder suspension    . Shoulder surgery    . Foot surgery    . Pr vein  bypass graft,aorto-fem-pop  05/27/2010  . Cardiac catheterization    . Femoral-popliteal bypass graft  10/12/2011    Procedure: BYPASS GRAFT FEMORAL-POPLITEAL ARTERY;  Surgeon: Elam Dutch, MD;  Location: [facility] OR;  Service: Vascular;  Laterality: Left;  Left Femoral-Popliteal Bypass Graft using 6mm x 80cm Propaten Graft with intraop arteriogram times one.  . Intraoperative arteriogram  10/12/2011    Procedure: INTRA OPERATIVE ARTERIOGRAM;  Surgeon: Elam Dutch, MD;  Location: [facility];  Service: Vascular;  Laterality: Left;    No Known Allergies  Current Outpatient Prescriptions  Medication Sig Dispense Refill  . Alcohol Swabs (B-D SINGLE USE SWABS REGULAR) PADS       . amitriptyline (ELAVIL) 75 MG tablet Take 75 mg by mouth at bedtime.      Marland Kitchen amLODipine (NORVASC) 10 MG tablet Take 10 mg by mouth daily.       . cloNIDine (CATAPRES) 0.3 MG tablet Take 0.3 mg by mouth daily.       . clopidogrel (PLAVIX) 75 MG tablet Take 75 mg by mouth daily.       . diphenhydrAMINE (BENADRYL) 25 MG tablet Take 25 mg by mouth every 6 (six) hours as needed for itching or allergies.      . diphenhydrAMINE (BENADRYL) 25 MG tablet Take 2 tablets (50 mg total) by mouth every 4 (four) hours as needed for itching.  20 tablet  0  . DULoxetine (CYMBALTA) 30 MG capsule Take 30 mg by mouth at bedtime.      . hydrALAZINE (APRESOLINE) 25 MG tablet Take 25 mg by mouth 2 (two) times daily.       . hydrochlorothiazide (HYDRODIURIL) 25 MG tablet Take 25 mg by mouth daily.       Marland Kitchen HYDROcodone-acetaminophen (NORCO) 10-325 MG per tablet Take 1 tablet by mouth every 4 (four) hours as needed (pain).       . insulin aspart (NOVOLOG) 100 UNIT/ML injection Inject 5-10 Units into the skin 3 (three) times daily as needed for high blood sugar (cbg over 200). Per sliding scale      . insulin glargine (LANTUS) 100 UNIT/ML injection Inject 80 Units into the skin at bedtime. Dispense syringes and needles in needed, dispense Lantus pen if  approved by insurance.      . lansoprazole (PREVACID) 30 MG capsule Take 30 mg by mouth daily.       Marland Kitchen levothyroxine (SYNTHROID, LEVOTHROID) 88 MCG tablet Take 88 mcg by mouth daily before breakfast.      . LORazepam (ATIVAN) 1 MG tablet Take 3 mg by mouth at bedtime.      . pravastatin (PRAVACHOL) 20 MG  tablet Take 20 mg by mouth daily.      . promethazine (PHENERGAN) 25 MG suppository Place 1 suppository (25 mg total) rectally every 6 (six) hours as needed for nausea or vomiting.  2 suppository  0  . cyclobenzaprine (FLEXERIL) 10 MG tablet Take 10 mg by mouth at bedtime as needed for muscle spasms.       . promethazine (PHENERGAN) 25 MG tablet Take 1 tablet (25 mg total) by mouth every 6 (six) hours as needed for nausea or vomiting.  10 tablet  0  . tolterodine (DETROL LA) 4 MG 24 hr capsule Take 4 mg by mouth daily.       . [DISCONTINUED] Gabapentin (NEURONTIN PO) Take by mouth. New medicine, pt doesn't remember dosage       No current facility-administered medications for this visit.    ROS: See HPI for pertinent positives and negatives.   Physical Examination  Filed Vitals:   11/02/13 1443  BP: 121/68  Pulse: 67  Resp: 16  Height: 5\' 1"  (1.549 m)  Weight: 173 lb (78.472 kg)   Body mass index is 32.7 kg/(m^2).  General: A&O x 3, WDWN, obese female.  Gait: normal  Eyes: PERRLA.  Pulmonary: fine rales in bases, without wheezes ,  or rhonchi. No dyspnea. Cardiac: regular Rythm , without detected murmur.   Carotid Bruits  Left  Right    Negative  Negative   Aorta is not palpable.  Radial pulses: right is 1+ palpable, left is not present, left brachial pulse is 2+ palpable   VASCULAR EXAM:  Extremities without ischemic changes  without Gangrene; without open wounds. Heels have some mild cracking.  LE Pulses  LEFT  RIGHT   FEMORAL  2+ palpable  not palpable   POPLITEAL  not palpable  not palpable   POSTERIOR TIBIAL  not palpable  not palpable   DORSALIS PEDIS  ANTERIOR  TIBIAL  2+palpable  not palpable    Abdomen: soft, NT, no masses.  Skin: no rashes, no ulcers noted.  Musculoskeletal: no muscle wasting or atrophy.  Neurologic: A&O X 3; Appropriate Affect ; SENSATION: normal; MOTOR FUNCTION: moving all extremities equally, motor strength 5/5 throughout. Speech is fluent/normal. CN 2-12 intact except for uvula deviation to the right.   Non-Invasive Vascular Imaging: DATE: 11/02/2013 ABI: RIGHT 0.73, Waveforms: monophasic, TBI: 0.49;  LEFT 1.1, Waveforms: triphasic, TBI: 0.86 Previous (05/04/13) ABI's: Right: 0.65, Left: 1.08 DUPLEX SCAN OF BYPASS: Widely patent graft without stenosis or hyperplasia.   ASSESSMENT: CHANDRA ASHER is a 68 y.o. female who is status post left femoropopliteal bypass graft with propaten on 10/12/2011.  She walks 3 blocks daily, her feet and left calf hurt, not relieved by rest; denies non healing wounds; however, her left lower extremity ABI is normal indicating that her source of discomfort is not arterial insufficiency. The left femoropopliteal bypass graft is widely patent without stenosis or hyperplasia.   PLAN:  She was again counseled re smoking cessation. Graduated walking program. I discussed in depth with the patient the nature of atherosclerosis, and emphasized the importance of maximal medical management including strict control of blood pressure, blood glucose, and lipid levels, obtaining regular exercise, and cessation of smoking.  The patient is aware that without maximal medical management the underlying atherosclerotic disease process will progress, limiting the benefit of any interventions.  Based on the patient's vascular studies and examination, pt will return to clinic in 1 year for ABI's and left LE arterial Duplex.  The patient was given information about PAD including signs, symptoms, treatment, what symptoms should prompt the patient to seek immediate medical care, and risk reduction measures to  take.  Clemon Chambers, RN, MSN, FNP-C [facility] [employer] Phone: [phone]  Clinic MD: Bridgett Larsson on call  11/02/2013 2:50 PM

## 2013-11-02 NOTE — Patient Instructions (Signed)
Peripheral Vascular Disease Peripheral Vascular Disease (PVD), also called Peripheral Arterial Disease (PAD), is a circulation problem caused by cholesterol (atherosclerotic plaque) deposits in the arteries. PVD commonly occurs in the lower extremities (legs) but it can occur in other areas of the body, such as your arms. The cholesterol buildup in the arteries reduces blood flow which can cause pain and other serious problems. The presence of PVD can place a person at risk for Coronary Artery Disease (CAD).  CAUSES  Causes of PVD can be many. It is usually associated with more than one risk factor such as:   High Cholesterol.  Smoking.  Diabetes.  Lack of exercise or inactivity.  High blood pressure (hypertension).  Obesity.  Family history. SYMPTOMS   When the lower extremities are affected, patients with PVD may experience:  Leg pain with exertion or physical activity. This is called INTERMITTENT CLAUDICATION. This may present as cramping or numbness with physical activity. The location of the pain is associated with the level of blockage. For example, blockage at the abdominal level (distal abdominal aorta) may result in buttock or hip pain. Lower leg arterial blockage may result in calf pain.  As PVD becomes more severe, pain can develop with less physical activity.  In people with severe PVD, leg pain may occur at rest.  Other PVD signs and symptoms:  Leg numbness or weakness.  Coldness in the affected leg or foot, especially when compared to the other leg.  A change in leg color.  Patients with significant PVD are more prone to ulcers or sores on toes, feet or legs. These may take longer to heal or may reoccur. The ulcers or sores can become infected.  If signs and symptoms of PVD are ignored, gangrene may occur. This can result in the loss of toes or loss of an entire limb.  Not all leg pain is related to PVD. Other medical conditions can cause leg pain such  as:  Blood clots (embolism) or Deep Vein Thrombosis.  Inflammation of the blood vessels (vasculitis).  Spinal stenosis. DIAGNOSIS  Diagnosis of PVD can involve several different types of tests. These can include:  Pulse Volume Recording Method (PVR). This test is simple, painless and does not involve the use of X-rays. PVR involves measuring and comparing the blood pressure in the arms and legs. An ABI (Ankle-Brachial Index) is calculated. The normal ratio of blood pressures is 1. As this number becomes smaller, it indicates more severe disease.  < 0.95 - indicates significant narrowing in one or more leg vessels.  <0.8 - there will usually be pain in the foot, leg or buttock with exercise.  <0.4 - will usually have pain in the legs at rest.  <0.25 - usually indicates limb threatening PVD.  Doppler detection of pulses in the legs. This test is painless and checks to see if you have a pulses in your legs/feet.  A dye or contrast material (a substance that highlights the blood vessels so they show up on x-ray) may be given to help your caregiver better see the arteries for the following tests. The dye is eliminated from your body by the kidney's. Your caregiver may order blood work to check your kidney function and other laboratory values before the following tests are performed:  Magnetic Resonance Angiography (MRA). An MRA is a picture study of the blood vessels and arteries. The MRA machine uses a large magnet to produce images of the blood vessels.  Computed Tomography Angiography (CTA). A CTA   is a specialized x-ray that looks at how the blood flows in your blood vessels. An IV may be inserted into your arm so contrast dye can be injected.  Angiogram. Is a procedure that uses x-rays to look at your blood vessels. This procedure is minimally invasive, meaning a small incision (cut) is made in your groin. A small tube (catheter) is then inserted into the artery of your groin. The catheter  is guided to the blood vessel or artery your caregiver wants to examine. Contrast dye is injected into the catheter. X-rays are then taken of the blood vessel or artery. After the images are obtained, the catheter is taken out. TREATMENT  Treatment of PVD involves many interventions which may include:  Lifestyle changes:  Quitting smoking.  Exercise.  Following a low fat, low cholesterol diet.  Control of diabetes.  Foot care is very important to the PVD patient. Good foot care can help prevent infection.  Medication:  Cholesterol-lowering medicine.  Blood pressure medicine.  Anti-platelet drugs.  Certain medicines may reduce symptoms of Intermittent Claudication.  Interventional/Surgical options:  Angioplasty. An Angioplasty is a procedure that inflates a balloon in the blocked artery. This opens the blocked artery to improve blood flow.  Stent Implant. A wire mesh tube (stent) is placed in the artery. The stent expands and stays in place, allowing the artery to remain open.  Peripheral Bypass Surgery. This is a surgical procedure that reroutes the blood around a blocked artery to help improve blood flow. This type of procedure may be performed if Angioplasty or stent implants are not an option. SEEK IMMEDIATE MEDICAL CARE IF:   You develop pain or numbness in your arms or legs.  Your arm or leg turns cold, becomes blue in color.  You develop redness, warmth, swelling and pain in your arms or legs. MAKE SURE YOU:   Understand these instructions.  Will watch your condition.  Will get help right away if you are not doing well or get worse. Document Released: 03/19/2004 Document Revised: 05/04/2011 Document Reviewed: 02/14/2008 ExitCare Patient Information 2015 [employer]. This information is not intended to replace advice given to you by your health care provider. Make sure you discuss any questions you have with your health care provider.   Smoking  Cessation Quitting smoking is important to your health and has many advantages. However, it is not always easy to quit since nicotine is a very addictive drug. Oftentimes, people try 3 times or more before being able to quit. This document explains the best ways for you to prepare to quit smoking. Quitting takes hard work and a lot of effort, but you can do it. ADVANTAGES OF QUITTING SMOKING  You will live longer, feel better, and live better.  Your body will feel the impact of quitting smoking almost immediately.  Within 20 minutes, blood pressure decreases. Your pulse returns to its normal level.  After 8 hours, carbon monoxide levels in the blood return to normal. Your oxygen level increases.  After 24 hours, the chance of having a heart attack starts to decrease. Your breath, hair, and body stop smelling like smoke.  After 48 hours, damaged nerve endings begin to recover. Your sense of taste and smell improve.  After 72 hours, the body is virtually free of nicotine. Your bronchial tubes relax and breathing becomes easier.  After 2 to 12 weeks, lungs can hold more air. Exercise becomes easier and circulation improves.  The risk of having a heart attack, stroke, cancer,   or lung disease is greatly reduced.  After 1 year, the risk of coronary heart disease is cut in half.  After 5 years, the risk of stroke falls to the same as a nonsmoker.  After 10 years, the risk of lung cancer is cut in half and the risk of other cancers decreases significantly.  After 15 years, the risk of coronary heart disease drops, usually to the level of a nonsmoker.  If you are pregnant, quitting smoking will improve your chances of having a healthy baby.  The people you live with, especially any children, will be healthier.  You will have extra money to spend on things other than cigarettes. QUESTIONS TO THINK ABOUT BEFORE ATTEMPTING TO QUIT You may want to talk about your answers with your health care  provider.  Why do you want to quit?  If you tried to quit in the past, what helped and what did not?  What will be the most difficult situations for you after you quit? How will you plan to handle them?  Who can help you through the tough times? Your family? Friends? A health care provider?  What pleasures do you get from smoking? What ways can you still get pleasure if you quit? Here are some questions to ask your health care provider:  How can you help me to be successful at quitting?  What medicine do you think would be best for me and how should I take it?  What should I do if I need more help?  What is smoking withdrawal like? How can I get information on withdrawal? GET READY  Set a quit date.  Change your environment by getting rid of all cigarettes, ashtrays, matches, and lighters in your home, car, or work. Do not let people smoke in your home.  Review your past attempts to quit. Think about what worked and what did not. GET SUPPORT AND ENCOURAGEMENT You have a better chance of being successful if you have help. You can get support in many ways.  Tell your family, friends, and coworkers that you are going to quit and need their support. Ask them not to smoke around you.  Get individual, group, or telephone counseling and support. Programs are available at local hospitals and health centers. Call your local health department for information about programs in your area.  Spiritual beliefs and practices may help some smokers quit.  Download a "quit meter" on your computer to keep track of quit statistics, such as how long you have gone without smoking, cigarettes not smoked, and money saved.  Get a self-help book about quitting smoking and staying off tobacco. LEARN NEW SKILLS AND BEHAVIORS  Distract yourself from urges to smoke. Talk to someone, go for a walk, or occupy your time with a task.  Change your normal routine. Take a different route to work. Drink tea  instead of coffee. Eat breakfast in a different place.  Reduce your stress. Take a hot bath, exercise, or read a book.  Plan something enjoyable to do every day. Reward yourself for not smoking.  Explore interactive web-based programs that specialize in helping you quit. GET MEDICINE AND USE IT CORRECTLY Medicines can help you stop smoking and decrease the urge to smoke. Combining medicine with the above behavioral methods and support can greatly increase your chances of successfully quitting smoking.  Nicotine replacement therapy helps deliver nicotine to your body without the negative effects and risks of smoking. Nicotine replacement therapy includes nicotine gum, lozenges, inhalers,   nasal sprays, and skin patches. Some may be available over-the-counter and others require a prescription.  Antidepressant medicine helps people abstain from smoking, but how this works is unknown. This medicine is available by prescription.  Nicotinic receptor partial agonist medicine simulates the effect of nicotine in your brain. This medicine is available by prescription. Ask your health care provider for advice about which medicines to use and how to use them based on your health history. Your health care provider will tell you what side effects to look out for if you choose to be on a medicine or therapy. Carefully read the information on the package. Do not use any other product containing nicotine while using a nicotine replacement product.  RELAPSE OR DIFFICULT SITUATIONS Most relapses occur within the first 3 months after quitting. Do not be discouraged if you start smoking again. Remember, most people try several times before finally quitting. You may have symptoms of withdrawal because your body is used to nicotine. You may crave cigarettes, be irritable, feel very hungry, cough often, get headaches, or have difficulty concentrating. The withdrawal symptoms are only temporary. They are strongest when you  first quit, but they will go away within 10-14 days. To reduce the chances of relapse, try to:  Avoid drinking alcohol. Drinking lowers your chances of successfully quitting.  Reduce the amount of caffeine you consume. Once you quit smoking, the amount of caffeine in your body increases and can give you symptoms, such as a rapid heartbeat, sweating, and anxiety.  Avoid smokers because they can make you want to smoke.  Do not let weight gain distract you. Many smokers will gain weight when they quit, usually less than 10 pounds. Eat a healthy diet and stay active. You can always lose the weight gained after you quit.  Find ways to improve your mood other than smoking. FOR MORE INFORMATION  www.smokefree.gov  Document Released: 02/03/2001 Document Revised: 06/26/2013 Document Reviewed: 05/21/2011 Exit[employer] Patient Information 2015 [employer]. This information is not intended to replace advice given to you by your health care provider. Make sure you discuss any questions you have with your health care provider.  

## 2013-11-21 ENCOUNTER — Other Ambulatory Visit: Payer: Self-pay | Admitting: Pediatrics

## 2013-11-21 ENCOUNTER — Other Ambulatory Visit: Payer: Self-pay | Admitting: Internal Medicine

## 2013-11-21 DIAGNOSIS — Z1231 Encounter for screening mammogram for malignant neoplasm of breast: Secondary | ICD-10-CM

## 2013-12-05 ENCOUNTER — Ambulatory Visit: Payer: Self-pay

## 2013-12-08 ENCOUNTER — Ambulatory Visit
Admission: RE | Admit: 2013-12-08 | Discharge: 2013-12-08 | Disposition: A | Discharge status: Home / Self Care | Payer: Medicare HMO | Source: Ambulatory Visit | Attending: Internal Medicine | Admitting: Internal Medicine

## 2013-12-08 DIAGNOSIS — Z1231 Encounter for screening mammogram for malignant neoplasm of breast: Secondary | ICD-10-CM

## 2014-02-01 ENCOUNTER — Encounter (HOSPITAL_COMMUNITY): Payer: Self-pay | Admitting: Internal Medicine

## 2014-02-07 ENCOUNTER — Emergency Department (INDEPENDENT_AMBULATORY_CARE_PROVIDER_SITE_OTHER)
Admission: EM | Admit: 2014-02-07 | Discharge: 2014-02-07 | Disposition: A | Discharge status: Nursing Home (Includes ICF) | Payer: Medicare HMO | Source: Home / Self Care | Attending: Emergency Medicine | Admitting: Emergency Medicine

## 2014-02-07 ENCOUNTER — Encounter (HOSPITAL_COMMUNITY): Payer: Self-pay | Admitting: Family Medicine

## 2014-02-07 ENCOUNTER — Emergency Department (HOSPITAL_COMMUNITY)
Admission: EM | Admit: 2014-02-07 | Discharge: 2014-02-07 | Disposition: A | Discharge status: Home / Self Care | Payer: Medicare HMO | Attending: Emergency Medicine | Admitting: Emergency Medicine

## 2014-02-07 ENCOUNTER — Encounter (HOSPITAL_COMMUNITY): Payer: Self-pay | Admitting: Emergency Medicine

## 2014-02-07 DIAGNOSIS — M79605 Pain in left leg: Secondary | ICD-10-CM

## 2014-02-07 DIAGNOSIS — Z87891 Personal history of nicotine dependence: Secondary | ICD-10-CM | POA: Diagnosis not present

## 2014-02-07 DIAGNOSIS — Z951 Presence of aortocoronary bypass graft: Secondary | ICD-10-CM | POA: Diagnosis not present

## 2014-02-07 DIAGNOSIS — Z8673 Personal history of transient ischemic attack (TIA), and cerebral infarction without residual deficits: Secondary | ICD-10-CM | POA: Insufficient documentation

## 2014-02-07 DIAGNOSIS — J449 Chronic obstructive pulmonary disease, unspecified: Secondary | ICD-10-CM | POA: Diagnosis not present

## 2014-02-07 DIAGNOSIS — I739 Peripheral vascular disease, unspecified: Secondary | ICD-10-CM

## 2014-02-07 DIAGNOSIS — I252 Old myocardial infarction: Secondary | ICD-10-CM | POA: Insufficient documentation

## 2014-02-07 DIAGNOSIS — I10 Essential (primary) hypertension: Secondary | ICD-10-CM | POA: Diagnosis not present

## 2014-02-07 DIAGNOSIS — Z79899 Other long term (current) drug therapy: Secondary | ICD-10-CM | POA: Insufficient documentation

## 2014-02-07 DIAGNOSIS — I2510 Atherosclerotic heart disease of native coronary artery without angina pectoris: Secondary | ICD-10-CM | POA: Diagnosis not present

## 2014-02-07 DIAGNOSIS — Z7902 Long term (current) use of antithrombotics/antiplatelets: Secondary | ICD-10-CM | POA: Insufficient documentation

## 2014-02-07 DIAGNOSIS — Z794 Long term (current) use of insulin: Secondary | ICD-10-CM | POA: Diagnosis not present

## 2014-02-07 DIAGNOSIS — Z862 Personal history of diseases of the blood and blood-forming organs and certain disorders involving the immune mechanism: Secondary | ICD-10-CM | POA: Insufficient documentation

## 2014-02-07 DIAGNOSIS — E119 Type 2 diabetes mellitus without complications: Secondary | ICD-10-CM | POA: Insufficient documentation

## 2014-02-07 DIAGNOSIS — K219 Gastro-esophageal reflux disease without esophagitis: Secondary | ICD-10-CM | POA: Diagnosis not present

## 2014-02-07 DIAGNOSIS — E039 Hypothyroidism, unspecified: Secondary | ICD-10-CM | POA: Diagnosis not present

## 2014-02-07 DIAGNOSIS — G9009 Other idiopathic peripheral autonomic neuropathy: Secondary | ICD-10-CM | POA: Insufficient documentation

## 2014-02-07 DIAGNOSIS — F329 Major depressive disorder, single episode, unspecified: Secondary | ICD-10-CM | POA: Insufficient documentation

## 2014-02-07 DIAGNOSIS — Z85528 Personal history of other malignant neoplasm of kidney: Secondary | ICD-10-CM | POA: Diagnosis not present

## 2014-02-07 DIAGNOSIS — Z9889 Other specified postprocedural states: Secondary | ICD-10-CM | POA: Diagnosis not present

## 2014-02-07 DIAGNOSIS — E785 Hyperlipidemia, unspecified: Secondary | ICD-10-CM | POA: Insufficient documentation

## 2014-02-07 DIAGNOSIS — M1990 Unspecified osteoarthritis, unspecified site: Secondary | ICD-10-CM | POA: Diagnosis not present

## 2014-02-07 DIAGNOSIS — F419 Anxiety disorder, unspecified: Secondary | ICD-10-CM | POA: Insufficient documentation

## 2014-02-07 LAB — I-STAT CHEM 8, ED
BUN: 27 mg/dL — ABNORMAL HIGH (ref 6–23)
CALCIUM ION: 1.17 mmol/L (ref 1.13–1.30)
CREATININE: 1.2 mg/dL — AB (ref 0.50–1.10)
Chloride: 104 mEq/L (ref 96–112)
GLUCOSE: 228 mg/dL — AB (ref 70–99)
HEMATOCRIT: 44 % (ref 36.0–46.0)
HEMOGLOBIN: 15 g/dL (ref 12.0–15.0)
Potassium: 4.8 mEq/L (ref 3.7–5.3)
Sodium: 135 mEq/L — ABNORMAL LOW (ref 137–147)
TCO2: 20 mmol/L (ref 0–100)

## 2014-02-07 LAB — CBC
HEMATOCRIT: 39.2 % (ref 36.0–46.0)
Hemoglobin: 13.2 g/dL (ref 12.0–15.0)
MCH: 28.6 pg (ref 26.0–34.0)
MCHC: 33.7 g/dL (ref 30.0–36.0)
MCV: 84.8 fL (ref 78.0–100.0)
Platelets: 354 10*3/uL (ref 150–400)
RBC: 4.62 MIL/uL (ref 3.87–5.11)
RDW: 13.7 % (ref 11.5–15.5)
WBC: 7.4 10*3/uL (ref 4.0–10.5)

## 2014-02-07 MED ORDER — METHOCARBAMOL 500 MG PO TABS: 500.0000 mg | ORAL_TABLET | Freq: Two times a day (BID) | ORAL | Status: DC | PRN

## 2014-02-07 MED ORDER — MORPHINE SULFATE 4 MG/ML IJ SOLN
4.0000 mg | Freq: Once | INTRAMUSCULAR | Status: AC
  Administered 2014-02-07: 4 mg via INTRAVENOUS
  Filled 2014-02-07: qty 1

## 2014-02-07 MED ORDER — SODIUM CHLORIDE 0.9 % IV BOLUS (SEPSIS)
500.0000 mL | Freq: Once | INTRAVENOUS | Status: AC
  Administered 2014-02-07: 500 mL via INTRAVENOUS

## 2014-02-07 NOTE — Discharge Instructions (Signed)
Please call your doctor for a followup appointment within 24-48 hours. When you talk to your doctor please let them know that you were seen in the emergency department and have them acquire all of your records so that they can discuss the findings with you and formulate a treatment plan to fully care for your new and ongoing problems. ° °

## 2014-02-07 NOTE — ED Provider Notes (Signed)
CSN: [id number]     Arrival date & time 02/07/14  1058 History   First MD Initiated Contact with Patient 02/07/14 1311     Chief Complaint  Patient presents with  . Leg Pain     (Consider location/radiation/quality/duration/timing/severity/associated sxs/prior Treatment) HPI  The patient is a 68 year old female, she has a history of a femoropopliteal bypass that was performed proximally 2 years ago by Dr. Oneida Alar, this was for arterial blockages, she had initially some improvement, in September she was seen in the office and at that time she was having claudication symptoms. She presents today with worsening pain which she states has been there since Friday, it is persistent, worse when she walks, better when she rests but, constant all the time. At this time the pain is severe, she denies any change in color of her leg, she denies swelling of her leg, she has been taking hydrocodone 10 mg tablets without any improvement.  Past Medical History  Diagnosis Date  . Hyperlipidemia   . Arthritis   . Hypothyroidism   . GERD (gastroesophageal reflux disease)   . Anxiety   . Diabetes mellitus age 28    insulin dependent  . Peripheral vascular disease   . Lumbar disc disease   . Insulin dependent type 2 diabetes mellitus, controlled   . GERD 07/20/2006  . CAD (coronary artery disease)   . CAD (coronary artery disease) of bypass graft   . Vertigo   . History of transient ischemic attack (TIA)   . Myocardial infarction     AGE 82    . Anginal pain   . Hypertension   . Depression   . COPD 12/14/2008    PATIENT DENIES    . Stroke     3 MINI STROKES  RT SIDED WEAKNESS  . Personal history of malignant neoplasm of kidney(V10.52) 12/14/2008    Laparoscopic biopsy and cryoablation 7/08 Dr. Vernie Shanks   . Headache(784.0)   . Neuromuscular disorder     PERIPHERAL NEUROPATHY  . Anemia   . 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 Diabetic coma Feb. 2014   Past Surgical History   Procedure Laterality Date  . Appendectomy    . Abdominal hysterectomy    . Knee surgery    . Cholecystectomy    . Lung surgery      lung nodule removed from the right side  . Laparascopic cryoablation of left kidney  08/2006    Dr. Gladis Riffle for renal cell cancer  . Bladder suspension    . Shoulder surgery    . Foot surgery    . Pr vein bypass graft,aorto-fem-pop  05/27/2010  . Cardiac catheterization    . Femoral-popliteal bypass graft  10/12/2011    Procedure: BYPASS GRAFT FEMORAL-POPLITEAL ARTERY;  Surgeon: Elam Dutch, MD;  Location: [facility] OR;  Service: Vascular;  Laterality: Left;  Left Femoral-Popliteal Bypass Graft using 29mm x 80cm Propaten Graft with intraop arteriogram times one.  . Intraoperative arteriogram  10/12/2011    Procedure: INTRA OPERATIVE ARTERIOGRAM;  Surgeon: Elam Dutch, MD;  Location: [facility];  Service: Vascular;  Laterality: Left;  . Left heart catheterization with coronary angiogram N/A 05/11/2011    Procedure: LEFT HEART CATHETERIZATION WITH CORONARY ANGIOGRAM;  Surgeon: Jolaine Artist, MD;  Location: [facility] CATH LAB;  Service: Cardiovascular;  Laterality: N/A;  . Abdominal aortagram N/A 08/21/2011    Procedure: ABDOMINAL Maxcine Ham;  Surgeon: Elam Dutch, MD;  Location: [facility]  CATH LAB;  Service: Cardiovascular;  Laterality: N/A;   Family History  Problem Relation Age of Onset  . Heart disease Mother   . Heart disease Father   . Heart disease Sister   . Lung cancer Mother   . Lung cancer Father    History  Substance Use Topics  . Smoking status: Former Smoker -- 0.50 packs/day for 50 years    Types: Cigarettes    Quit date: 07/12/2011  . Smokeless tobacco: Never Used  . Alcohol Use: No   OB History    No data available     Review of Systems  All other systems reviewed and are negative.     Allergies  Review of patient's allergies indicates no known allergies.  Home Medications   Prior to Admission medications   Medication Sig Start  Date End Date Taking? Authorizing Provider  Alcohol Swabs (B-D SINGLE USE SWABS REGULAR) PADS  03/21/13   Historical Provider, MD  amitriptyline (ELAVIL) 75 MG tablet Take 75 mg by mouth at bedtime.    Historical Provider, MD  amLODipine (NORVASC) 10 MG tablet Take 10 mg by mouth daily.  08/22/13   Historical Provider, MD  cloNIDine (CATAPRES) 0.3 MG tablet Take 0.3 mg by mouth daily.  08/22/13   Historical Provider, MD  clopidogrel (PLAVIX) 75 MG tablet Take 75 mg by mouth daily.     Historical Provider, MD  cyclobenzaprine (FLEXERIL) 10 MG tablet Take 10 mg by mouth at bedtime as needed for muscle spasms.  03/13/13   Historical Provider, MD  diphenhydrAMINE (BENADRYL) 25 MG tablet Take 25 mg by mouth every 6 (six) hours as needed for itching or allergies.    Historical Provider, MD  diphenhydrAMINE (BENADRYL) 25 MG tablet Take 2 tablets (50 mg total) by mouth every 4 (four) hours as needed for itching. 09/09/13   Babette Relic, MD  DULoxetine (CYMBALTA) 30 MG capsule Take 30 mg by mouth at bedtime.    Historical Provider, MD  hydrALAZINE (APRESOLINE) 25 MG tablet Take 25 mg by mouth 2 (two) times daily.  08/22/13   Historical Provider, MD  hydrochlorothiazide (HYDRODIURIL) 25 MG tablet Take 25 mg by mouth daily.  07/10/13   Historical Provider, MD  HYDROcodone-acetaminophen (NORCO) 10-325 MG per tablet Take 1 tablet by mouth every 4 (four) hours as needed (pain).     Historical Provider, MD  insulin aspart (NOVOLOG) 100 UNIT/ML injection Inject 5-10 Units into the skin 3 (three) times daily as needed for high blood sugar (cbg over 200). Per sliding scale    Historical Provider, MD  insulin glargine (LANTUS) 100 UNIT/ML injection Inject 80 Units into the skin at bedtime. Dispense syringes and needles in needed, dispense Lantus pen if approved by insurance. 09/23/12   Thurnell Lose, MD  lansoprazole (PREVACID) 30 MG capsule Take 30 mg by mouth daily.     Historical Provider, MD  levothyroxine (SYNTHROID,  LEVOTHROID) 88 MCG tablet Take 88 mcg by mouth daily before breakfast.    Historical Provider, MD  LORazepam (ATIVAN) 1 MG tablet Take 3 mg by mouth at bedtime.    Historical Provider, MD  methocarbamol (ROBAXIN) 500 MG tablet Take 1 tablet (500 mg total) by mouth 2 (two) times daily as needed for muscle spasms. 02/07/14   Johnna Acosta, MD  pravastatin (PRAVACHOL) 20 MG tablet Take 20 mg by mouth daily.    Historical Provider, MD  promethazine (PHENERGAN) 25 MG suppository Place 1 suppository (25 mg total) rectally every  6 (six) hours as needed for nausea or vomiting. 09/09/13   Babette Relic, MD  promethazine (PHENERGAN) 25 MG tablet Take 1 tablet (25 mg total) by mouth every 6 (six) hours as needed for nausea or vomiting. 09/09/13   Babette Relic, MD  tolterodine (DETROL LA) 4 MG 24 hr capsule Take 4 mg by mouth daily.     Historical Provider, MD   BP 107/58 mmHg  Pulse 79  Temp(Src) 98.1 F (36.7 C) (Oral)  Resp 18  Ht 5\' 1"  (1.549 m)  Wt 166 lb (75.297 kg)  BMI 31.38 kg/m2  SpO2 99% Physical Exam  Constitutional: She appears well-developed and well-nourished. No distress.  HENT:  Head: Normocephalic and atraumatic.  Mouth/Throat: Oropharynx is clear and moist. No oropharyngeal exudate.  Eyes: Conjunctivae and EOM are normal. Pupils are equal, round, and reactive to light. Right eye exhibits no discharge. Left eye exhibits no discharge. No scleral icterus.  Neck: Normal range of motion. Neck supple. No JVD present. No thyromegaly present.  Cardiovascular: Normal rate, regular rhythm and normal heart sounds.  Exam reveals no gallop and no friction rub.   No murmur heard. Week to absent pulses bilateral feet  Pulmonary/Chest: Effort normal and breath sounds normal. No respiratory distress. She has no wheezes. She has no rales.  Abdominal: Soft. Bowel sounds are normal. She exhibits no distension and no mass. There is no tenderness.  Musculoskeletal: Normal range of motion. She exhibits  tenderness (tenderness present in the left calf with palpation). She exhibits no edema.  Lymphadenopathy:    She has no cervical adenopathy.  Neurological: She is alert. Coordination normal.  Skin: Skin is warm and dry. No rash noted. No erythema.  Psychiatric: She has a normal mood and affect. Her behavior is normal.  Nursing note and vitals reviewed.   ED Course  Procedures (including critical care time) Labs Review Labs Reviewed  I-STAT CHEM 8, ED - Abnormal; Notable for the following:    Sodium 135 (*)    BUN 27 (*)    Creatinine, Ser 1.20 (*)    Glucose, Bld 228 (*)    All other components within normal limits  CBC    Imaging Review No results found.    MDM   Final diagnoses:  Pain of left lower extremity    Likely arterial insufficiency, check ABI, check Doppler for pulses, pain control, consult with vascular if abnormal ABIs.  Discussed case with vascular surgeon on call - ABIs without problems.  Pulses easily dopplerable - no signs of DVT / Edema and has reproducible pain with palaptona nd stretch of calf - on plavix, stable for d/c to f/u in vascular office - add muscle relaxant.  Low risk DVT.  Meds given in ED:  Medications  morphine 4 MG/ML injection 4 mg (4 mg Intravenous Given 02/07/14 1410)  sodium chloride 0.9 % bolus 500 mL (0 mLs Intravenous Stopped 02/07/14 1524)    New Prescriptions   METHOCARBAMOL (ROBAXIN) 500 MG TABLET    Take 1 tablet (500 mg total) by mouth 2 (two) times daily as needed for muscle spasms.      Johnna Acosta, MD 02/07/14 [phone]

## 2014-02-07 NOTE — ED Provider Notes (Signed)
CSN: [id number]     Arrival date & time 02/07/14  4696 History   First MD Initiated Contact with Patient 02/07/14 1012     Chief Complaint  Patient presents with  . Leg Pain   (Consider location/radiation/quality/duration/timing/severity/associated sxs/prior Treatment) HPI Comments: 68 year old female complaining of left leg pain for 5 days. Sudden onset. The pain occurrs from the knee to the toes. It is worse with ambulation. It is better with rest. She rates the pain as severe, 10 out of 10. This is the first time she has ever had leg pain like this before. No history of trauma, injury or fall. She has a rather substantial history of vascular disease to include peripheral vascular disease, peripheral neuropathy, diabetes controlled with insulin, coronary artery disease with bypass graft and myocardial infarction at age 58, past smoking history, TIAs and CVA, hypertension, hypothyroidism and dyslipidemia among others.   Past Medical History  Diagnosis Date  . Hyperlipidemia   . Arthritis   . Hypothyroidism   . GERD (gastroesophageal reflux disease)   . Anxiety   . Diabetes mellitus age 46    insulin dependent  . Peripheral vascular disease   . Lumbar disc disease   . Insulin dependent type 2 diabetes mellitus, controlled   . GERD 07/20/2006  . CAD (coronary artery disease)   . CAD (coronary artery disease) of bypass graft   . Vertigo   . History of transient ischemic attack (TIA)   . Myocardial infarction     AGE 37    . Anginal pain   . Hypertension   . Depression   . COPD 12/14/2008    PATIENT DENIES    . Stroke     3 MINI STROKES  RT SIDED WEAKNESS  . Personal history of malignant neoplasm of kidney(V10.52) 12/14/2008    Laparoscopic biopsy and cryoablation 7/08 Dr. Vernie Shanks   . Headache(784.0)   . Neuromuscular disorder     PERIPHERAL NEUROPATHY  . Anemia   . 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 Diabetic coma Feb. 2014   Past Surgical History  Procedure  Laterality Date  . Appendectomy    . Abdominal hysterectomy    . Knee surgery    . Cholecystectomy    . Lung surgery      lung nodule removed from the right side  . Laparascopic cryoablation of left kidney  08/2006    Dr. Gladis Riffle for renal cell cancer  . Bladder suspension    . Shoulder surgery    . Foot surgery    . Pr vein bypass graft,aorto-fem-pop  05/27/2010  . Cardiac catheterization    . Femoral-popliteal bypass graft  10/12/2011    Procedure: BYPASS GRAFT FEMORAL-POPLITEAL ARTERY;  Surgeon: Elam Dutch, MD;  Location: [facility] OR;  Service: Vascular;  Laterality: Left;  Left Femoral-Popliteal Bypass Graft using 75mm x 80cm Propaten Graft with intraop arteriogram times one.  . Intraoperative arteriogram  10/12/2011    Procedure: INTRA OPERATIVE ARTERIOGRAM;  Surgeon: Elam Dutch, MD;  Location: [facility];  Service: Vascular;  Laterality: Left;  . Left heart catheterization with coronary angiogram N/A 05/11/2011    Procedure: LEFT HEART CATHETERIZATION WITH CORONARY ANGIOGRAM;  Surgeon: Jolaine Artist, MD;  Location: [facility] CATH LAB;  Service: Cardiovascular;  Laterality: N/A;  . Abdominal aortagram N/A 08/21/2011    Procedure: ABDOMINAL Maxcine Ham;  Surgeon: Elam Dutch, MD;  Location: [facility] CATH LAB;  Service: Cardiovascular;  Laterality: N/A;  Family History  Problem Relation Age of Onset  . Heart disease Mother   . Heart disease Father   . Heart disease Sister   . Lung cancer Mother   . Lung cancer Father    History  Substance Use Topics  . Smoking status: Former Smoker -- 0.50 packs/day for 50 years    Types: Cigarettes    Quit date: 07/12/2011  . Smokeless tobacco: Never Used  . Alcohol Use: No   OB History    No data available     Review of Systems  Constitutional: Positive for activity change. Negative for fever and fatigue.  HENT: Negative.   Respiratory: Negative for cough and shortness of breath.   Cardiovascular: Positive for leg swelling. Negative for  chest pain.  Musculoskeletal: Positive for myalgias.  Skin: Negative for rash.    Allergies  Review of patient's allergies indicates no known allergies.  Home Medications   Prior to Admission medications   Medication Sig Start Date End Date Taking? Authorizing Provider  amLODipine (NORVASC) 10 MG tablet Take 10 mg by mouth daily.  08/22/13  Yes Historical Provider, MD  clopidogrel (PLAVIX) 75 MG tablet Take 75 mg by mouth daily.    Yes Historical Provider, MD  hydrochlorothiazide (HYDRODIURIL) 25 MG tablet Take 25 mg by mouth daily.  07/10/13  Yes Historical Provider, MD  insulin aspart (NOVOLOG) 100 UNIT/ML injection Inject 5-10 Units into the skin 3 (three) times daily as needed for high blood sugar (cbg over 200). Per sliding scale   Yes Historical Provider, MD  insulin glargine (LANTUS) 100 UNIT/ML injection Inject 80 Units into the skin at bedtime. Dispense syringes and needles in needed, dispense Lantus pen if approved by insurance. 09/23/12  Yes Thurnell Lose, MD  lansoprazole (PREVACID) 30 MG capsule Take 30 mg by mouth daily.    Yes Historical Provider, MD  levothyroxine (SYNTHROID, LEVOTHROID) 88 MCG tablet Take 88 mcg by mouth daily before breakfast.   Yes Historical Provider, MD  pravastatin (PRAVACHOL) 20 MG tablet Take 20 mg by mouth daily.   Yes Historical Provider, MD  Alcohol Swabs (B-D SINGLE USE SWABS REGULAR) PADS  03/21/13   Historical Provider, MD  amitriptyline (ELAVIL) 75 MG tablet Take 75 mg by mouth at bedtime.    Historical Provider, MD  cloNIDine (CATAPRES) 0.3 MG tablet Take 0.3 mg by mouth daily.  08/22/13   Historical Provider, MD  cyclobenzaprine (FLEXERIL) 10 MG tablet Take 10 mg by mouth at bedtime as needed for muscle spasms.  03/13/13   Historical Provider, MD  diphenhydrAMINE (BENADRYL) 25 MG tablet Take 25 mg by mouth every 6 (six) hours as needed for itching or allergies.    Historical Provider, MD  diphenhydrAMINE (BENADRYL) 25 MG tablet Take 2 tablets (50  mg total) by mouth every 4 (four) hours as needed for itching. 09/09/13   Babette Relic, MD  DULoxetine (CYMBALTA) 30 MG capsule Take 30 mg by mouth at bedtime.    Historical Provider, MD  hydrALAZINE (APRESOLINE) 25 MG tablet Take 25 mg by mouth 2 (two) times daily.  08/22/13   Historical Provider, MD  HYDROcodone-acetaminophen (NORCO) 10-325 MG per tablet Take 1 tablet by mouth every 4 (four) hours as needed (pain).     Historical Provider, MD  LORazepam (ATIVAN) 1 MG tablet Take 3 mg by mouth at bedtime.    Historical Provider, MD  promethazine (PHENERGAN) 25 MG suppository Place 1 suppository (25 mg total) rectally every 6 (six) hours as  needed for nausea or vomiting. 09/09/13   Babette Relic, MD  promethazine (PHENERGAN) 25 MG tablet Take 1 tablet (25 mg total) by mouth every 6 (six) hours as needed for nausea or vomiting. 09/09/13   Babette Relic, MD  tolterodine (DETROL LA) 4 MG 24 hr capsule Take 4 mg by mouth daily.     Historical Provider, MD   BP 106/68 mmHg  Pulse 76  Temp(Src) 97.4 F (36.3 C) (Oral)  Resp 18  SpO2 99% Physical Exam  Constitutional: She is oriented to person, place, and time. She appears well-developed and well-nourished. No distress.  Neck: Normal range of motion. Neck supple.  Cardiovascular: Normal rate.   Pulmonary/Chest: Effort normal. No respiratory distress.  Musculoskeletal: She exhibits tenderness. She exhibits no edema.  Left LE without current edema. Tenderness to the calf. There are old scars from previous vascular surgery.  The LE is warm and pink to red. Unable to palpate pulses in either ankle or foot.   Neurological: She is alert and oriented to person, place, and time.  Skin: Skin is warm and dry.  Psychiatric: She has a normal mood and affect.  Nursing note and vitals reviewed.   ED Course  Procedures (including critical care time) Labs Review Labs Reviewed - No data to display  Imaging Review No results found.   MDM   1. Lower  extremity pain, left   2. Peripheral vascular disease of extremity    Transfer to the [facility] ED for left lower extremity pain, claudication sx's, severe peripheral vascular dz.     Janne Napoleon, NP 02/07/14 1040

## 2014-02-07 NOTE — ED Notes (Signed)
Pt sent here from [facility] to R/O blood clot. sts hx of same. sts 5 days of left leg pain.

## 2014-02-07 NOTE — ED Notes (Signed)
Pt updated on plan of care and vascular consult. She is resting quietly at the time. NAD.

## 2014-02-07 NOTE — ED Notes (Addendum)
Pt states L leg pain x5 days. History of DVT. Sent to ED from Urgent Care. Strong pedal pulses with doppler. Able to wiggle digits. Sensation intact. Both feet warm to touch. 8/10 pain at the time, becomes worse with movement and ambulation.

## 2014-02-07 NOTE — ED Notes (Signed)
Right arm bp -- 129/69,  Right leg BP -- 131/100  Left leg BP -- 136/111 Left arm BP -- 100/60 -- pt states left arm falls asleep frequently

## 2014-02-07 NOTE — ED Notes (Signed)
C/o constant left leg pain onset 6 days; from knee to ankle Pain increases w/activity; reports pain is 10/10 Denies inj/trauma; hx of leg surgery; artery repair  Also c/o productive cough onset 6 days Smokes 0.5 ppd  Alert, no signs of acute distress.

## 2014-02-12 ENCOUNTER — Telehealth: Payer: Self-pay

## 2014-02-12 NOTE — Telephone Encounter (Addendum)
Phone call from pt.  Reported severe intermittent pain in calf of left leg with walking.  Stated the pain continues with walking, and eventually improves with rest.  Stated she thinks the pain started 02/02/14.  Reported that she was seen in the ER 02/07/14, and was told to f/u with her vascular surgeon as an outpatient.  Denies any loss of sensation in left LE or foot.  Denies any tissue loss, or open sores.  Stated her foot gets cold at times.  Reported there has been no color change.  Advised will notify her of appt. date/ time.  Verb. Understanding.

## 2014-02-13 NOTE — Telephone Encounter (Signed)
Left message for patient with appointment time and date, dpm

## 2014-02-23 DIAGNOSIS — W19XXXA Unspecified fall, initial encounter: Secondary | ICD-10-CM

## 2014-02-23 DIAGNOSIS — I2510 Atherosclerotic heart disease of native coronary artery without angina pectoris: Secondary | ICD-10-CM

## 2014-02-23 DIAGNOSIS — Y92009 Unspecified place in unspecified non-institutional (private) residence as the place of occurrence of the external cause: Secondary | ICD-10-CM

## 2014-02-23 HISTORY — DX: Atherosclerotic heart disease of native coronary artery without angina pectoris: I25.10

## 2014-02-23 HISTORY — DX: Unspecified place in unspecified non-institutional (private) residence as the place of occurrence of the external cause: Y92.009

## 2014-02-23 HISTORY — DX: Unspecified fall, initial encounter: W19.XXXA

## 2014-03-06 ENCOUNTER — Encounter: Payer: Self-pay | Admitting: Family

## 2014-03-07 ENCOUNTER — Ambulatory Visit (HOSPITAL_COMMUNITY)
Admission: RE | Admit: 2014-03-07 | Discharge: 2014-03-07 | Disposition: A | Discharge status: Home / Self Care | Payer: Medicare HMO | Source: Ambulatory Visit | Attending: Family | Admitting: Family

## 2014-03-07 ENCOUNTER — Ambulatory Visit (INDEPENDENT_AMBULATORY_CARE_PROVIDER_SITE_OTHER)
Admission: RE | Admit: 2014-03-07 | Discharge: 2014-03-07 | Disposition: A | Discharge status: Home / Self Care | Payer: Medicare HMO | Source: Ambulatory Visit | Attending: Family | Admitting: Family

## 2014-03-07 ENCOUNTER — Encounter: Payer: Self-pay | Admitting: Family

## 2014-03-07 ENCOUNTER — Ambulatory Visit (INDEPENDENT_AMBULATORY_CARE_PROVIDER_SITE_OTHER): Payer: Medicare HMO | Admitting: Family

## 2014-03-07 VITALS — BP 132/69 | HR 72 | Resp 16 | Ht 61.0 in | Wt 162.0 lb

## 2014-03-07 DIAGNOSIS — I739 Peripheral vascular disease, unspecified: Secondary | ICD-10-CM | POA: Diagnosis present

## 2014-03-07 DIAGNOSIS — IMO0002 Reserved for concepts with insufficient information to code with codable children: Secondary | ICD-10-CM

## 2014-03-07 DIAGNOSIS — T82392A Other mechanical complication of femoral arterial graft (bypass), initial encounter: Secondary | ICD-10-CM

## 2014-03-07 DIAGNOSIS — F17200 Nicotine dependence, unspecified, uncomplicated: Secondary | ICD-10-CM

## 2014-03-07 DIAGNOSIS — T82898A Other specified complication of vascular prosthetic devices, implants and grafts, initial encounter: Secondary | ICD-10-CM

## 2014-03-07 DIAGNOSIS — Z48812 Encounter for surgical aftercare following surgery on the circulatory system: Secondary | ICD-10-CM

## 2014-03-07 DIAGNOSIS — E1151 Type 2 diabetes mellitus with diabetic peripheral angiopathy without gangrene: Secondary | ICD-10-CM

## 2014-03-07 DIAGNOSIS — E1159 Type 2 diabetes mellitus with other circulatory complications: Secondary | ICD-10-CM

## 2014-03-07 DIAGNOSIS — E1165 Type 2 diabetes mellitus with hyperglycemia: Secondary | ICD-10-CM

## 2014-03-07 DIAGNOSIS — I70219 Atherosclerosis of native arteries of extremities with intermittent claudication, unspecified extremity: Secondary | ICD-10-CM

## 2014-03-07 DIAGNOSIS — I6523 Occlusion and stenosis of bilateral carotid arteries: Secondary | ICD-10-CM

## 2014-03-07 DIAGNOSIS — Z720 Tobacco use: Secondary | ICD-10-CM

## 2014-03-07 NOTE — Progress Notes (Signed)
[facility] [place] HISTORY AND PHYSICAL -PAD  History of Present Illness April Branch is a 69 y.o. female patient of Dr. Oneida Alar who is status post left femoropopliteal bypass graft with propaten on 10/12/2011.  She also has a history of carotid artery stenosis.   She returns today with c/o worsening left calf claudication since she fell about a month ago on her left knee, she did not feel that she injured herself at that time and was not medically evaluated, denies non healing wounds She has severe vertigo. States she had brain imaging about 2010 which demonstrated 3 TIA'S, has intermittent dizziness, has tingling and numbness in both hands, weakness in both hands, moreso in left.  Carotid Duplex on file result was from 8/5/20013, showed 60-79% right ICA stenosis and 40-59% left ICA stenosis, requested by Dr. Burt Knack, pt does not recall this.  March, 2015 carotid Duplex demonstrated <40% stenosis in both internal carotid arteries.  Her DM is better controlled than at her September 2015 visit and she continues to smoke.  She has known lumbar spine issues with radiculopathy in both legs.  She gets choked easily, has to drink through a straw.  She has dry cracked fissures on both heals, no infection, no drainage, no other non healing wounds., this is improving.   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  She has intermittent dizziness, has tingling and numbness in both hands, weakness in both hands, moreso in left.  She had severe headaches several years ago which prompted brain imaging, states was found she had 3 ministrokes, she does not recall any TIA symptoms other than the headache, denies lateralizing symptoms.   She was hospitalized recently at [facility] for dehydration, does not know how she got dehydrated.  She denies cough or dyspnea, states her sinuses remain "stopped up", she is currently taking an antibx for a sinus infection,  prescribed by her PCP, per pt.  Pt Diabetic: Yes, uncontrolled but imporved, states her home glucose readings are 100-179. Pt smoker: smoker (1/2 ppd, started at age 59 yrs)  Pt meds include:  Statin :Yes  ASA: No  Other anticoagulants/antiplatelets: Plavix      Past Medical History  Diagnosis Date  . Hyperlipidemia   . Arthritis   . Hypothyroidism   . GERD (gastroesophageal reflux disease)   . Anxiety   . Diabetes mellitus age 6    insulin dependent  . Peripheral vascular disease   . Lumbar disc disease   . Insulin dependent type 2 diabetes mellitus, controlled   . GERD 07/20/2006  . CAD (coronary artery disease)   . CAD (coronary artery disease) of bypass graft   . Vertigo   . History of transient ischemic attack (TIA)   . Myocardial infarction     AGE 30    . Anginal pain   . Hypertension   . Depression   . COPD 12/14/2008    PATIENT DENIES    . Stroke     3 MINI STROKES  RT SIDED WEAKNESS  . Personal history of malignant neoplasm of kidney(V10.52) 12/14/2008    Laparoscopic biopsy and cryoablation 7/08 Dr. Vernie Shanks   . Headache(784.0)   . Neuromuscular disorder     PERIPHERAL NEUROPATHY  . Anemia   . 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 Diabetic coma Feb. 2014  . Fall at home Jan. 2, 2016  Jan. 13, 2016  Left knee    Social History History  Substance Use Topics  . Smoking status: Former Smoker -- 0.50 packs/day for 50 years    Types: Cigarettes    Quit date: 07/12/2011  . Smokeless tobacco: Never Used  . Alcohol Use: No    Family History Family History  Problem Relation Age of Onset  . Heart disease Mother   . Heart disease Father   . Heart disease Sister   . Lung cancer Mother   . Lung cancer Father     Past Surgical History  Procedure Laterality Date  . Appendectomy    . Abdominal hysterectomy    . Knee surgery    . Cholecystectomy    . Lung surgery      lung nodule removed from the right side  . Laparascopic  cryoablation of left kidney  08/2006    Dr. Gladis Riffle for renal cell cancer  . Bladder suspension    . Shoulder surgery    . Foot surgery    . Pr vein bypass graft,aorto-fem-pop  05/27/2010  . Cardiac catheterization    . Femoral-popliteal bypass graft  10/12/2011    Procedure: BYPASS GRAFT FEMORAL-POPLITEAL ARTERY;  Surgeon: Elam Dutch, MD;  Location: [facility] OR;  Service: Vascular;  Laterality: Left;  Left Femoral-Popliteal Bypass Graft using 62mm x 80cm Propaten Graft with intraop arteriogram times one.  . Intraoperative arteriogram  10/12/2011    Procedure: INTRA OPERATIVE ARTERIOGRAM;  Surgeon: Elam Dutch, MD;  Location: [place];  Service: Vascular;  Laterality: Left;  . Left heart catheterization with coronary angiogram N/A 05/11/2011    Procedure: LEFT HEART CATHETERIZATION WITH CORONARY ANGIOGRAM;  Surgeon: Jolaine Artist, MD;  Location: [facility] CATH LAB;  Service: Cardiovascular;  Laterality: N/A;  . Abdominal aortagram N/A 08/21/2011    Procedure: ABDOMINAL Maxcine Ham;  Surgeon: Elam Dutch, MD;  Location: [facility] CATH LAB;  Service: Cardiovascular;  Laterality: N/A;    No Known Allergies  Current Outpatient Prescriptions  Medication Sig Dispense Refill  . Alcohol Swabs (B-D SINGLE USE SWABS REGULAR) PADS     . amitriptyline (ELAVIL) 75 MG tablet Take 75 mg by mouth at bedtime.    Marland Kitchen amLODipine (NORVASC) 10 MG tablet Take 10 mg by mouth daily.     . cloNIDine (CATAPRES) 0.3 MG tablet Take 0.3 mg by mouth daily.     . clopidogrel (PLAVIX) 75 MG tablet Take 75 mg by mouth daily.     . cyclobenzaprine (FLEXERIL) 10 MG tablet Take 10 mg by mouth at bedtime as needed for muscle spasms.     . diphenhydrAMINE (BENADRYL) 25 MG tablet Take 25 mg by mouth every 6 (six) hours as needed for itching or allergies.    . diphenhydrAMINE (BENADRYL) 25 MG tablet Take 2 tablets (50 mg total) by mouth every 4 (four) hours as needed for itching. 20 tablet 0  . DULoxetine (CYMBALTA) 30 MG capsule Take 30  mg by mouth at bedtime.    . hydrALAZINE (APRESOLINE) 25 MG tablet Take 25 mg by mouth 2 (two) times daily.     . hydrochlorothiazide (HYDRODIURIL) 25 MG tablet Take 25 mg by mouth daily.     Marland Kitchen HYDROcodone-acetaminophen (NORCO) 10-325 MG per tablet Take 1 tablet by mouth every 4 (four) hours as needed (pain).     . insulin aspart (NOVOLOG) 100 UNIT/ML injection Inject 5-10 Units into the skin 3 (three) times daily as needed for high blood sugar (cbg over 200). Per sliding scale    .  insulin glargine (LANTUS) 100 UNIT/ML injection Inject 80 Units into the skin at bedtime. Dispense syringes and needles in needed, dispense Lantus pen if approved by insurance.    . lansoprazole (PREVACID) 30 MG capsule Take 30 mg by mouth daily.     Marland Kitchen levothyroxine (SYNTHROID, LEVOTHROID) 88 MCG tablet Take 88 mcg by mouth daily before breakfast.    . LORazepam (ATIVAN) 1 MG tablet Take 3 mg by mouth at bedtime.    . methocarbamol (ROBAXIN) 500 MG tablet Take 1 tablet (500 mg total) by mouth 2 (two) times daily as needed for muscle spasms. 20 tablet 0  . pravastatin (PRAVACHOL) 20 MG tablet Take 20 mg by mouth daily.    . promethazine (PHENERGAN) 25 MG suppository Place 1 suppository (25 mg total) rectally every 6 (six) hours as needed for nausea or vomiting. 2 suppository 0  . tolterodine (DETROL LA) 4 MG 24 hr capsule Take 4 mg by mouth daily.     . promethazine (PHENERGAN) 25 MG tablet Take 1 tablet (25 mg total) by mouth every 6 (six) hours as needed for nausea or vomiting. (Patient not taking: Reported on 03/07/2014) 10 tablet 0  . [DISCONTINUED] Gabapentin (NEURONTIN PO) Take by mouth. New medicine, pt doesn't remember dosage     No current facility-administered medications for this visit.    ROS: See HPI for pertinent positives and negatives.   Physical Examination  Filed Vitals:   03/07/14 1612  BP: 132/69  Pulse: 72  Resp: 16  Height: 5\' 1"  (1.549 m)  Weight: 162 lb (73.483 kg)  SpO2: 98%   Body  mass index is 30.63 kg/(m^2).  General: A&O x 3, WDWN, obese female.  Gait: normal  Eyes: PERRLA.  Pulmonary: fine rales in bases, without wheezes , or rhonchi. No dyspnea. Cardiac: regular Rythm , without detected murmur.   Carotid Bruits  Left  Right    Negative  Negative   Aorta is not palpable.  Radial pulses: right is 1+ palpable, left is not present, left brachial pulse is 2+ palpable   VASCULAR EXAM:  Extremities without ischemic changes  without Gangrene; without open wounds. Heels have some mild cracking.  LE Pulses  LEFT  RIGHT   FEMORAL  2+ palpable  not palpable   POPLITEAL  not palpable  not palpable   POSTERIOR TIBIAL  not palpable  not palpable   DORSALIS PEDIS  ANTERIOR TIBIAL  2+palpable  not palpable    Abdomen: soft, NT, no masses.  Skin: no rashes, no ulcers noted.  Musculoskeletal: no muscle wasting or atrophy.  Neurologic: A&O X 3; Appropriate Affect ; SENSATION: normal; MOTOR FUNCTION: moving all extremities equally, motor strength 5/5 throughout. Speech is fluent/normal. CN 2-12 intact except for uvula deviation to the right.    Non-Invasive Vascular Imaging: DATE: 03/07/2014 LOWER EXTREMITY ARTERIAL DUPLEX EVALUATION    INDICATION: Follow-up left lower extremity graft; patient complains of new onset of left calf pain    PREVIOUS INTERVENTION(S): Left femoropopliteal arterial bypass graft placed 10/12/2011    DUPLEX EXAM:     RIGHT  LEFT   Peak Systolic Velocity (cm/s) Ratio (if abnormal) Waveform  Peak Systolic Velocity (cm/s) Ratio (if abnormal) Waveform     Inflow Artery 127  T     Proximal Anastomosis 0  Absent     Proximal Graft 0  Absent     Mid Graft 0  Absent      Distal Graft 0  Absent     Distal  Anastomosis 0  Absent     Outflow Artery 78  M  0.76 Today's ABI / TBI 0.68  0.73 Previous ABI / TBI (11/02/2013 ) 1.11    Waveform:    M - Monophasic       B - Biphasic       T - Triphasic  If Ankle  Brachial Index (ABI) or Toe Brachial Index (TBI) performed, please see complete report  ADDITIONAL FINDINGS:     IMPRESSION: Occluded left femoropopliteal arterial bypass graft     Compared to the previous exam:  NA     ASSESSMENT: April Branch is a 69 y.o. female who is status post left femoropopliteal bypass graft with propaten on 10/12/2011. She returns today with worsening left calf claudication.  She fell on her left knee a month ago, did not feel that she had injured herself, but since then has had worsening claudication in her left calf with walking, takes her 45 minutes to walk around a short block since she must stop multiple times, left calf pain is relieved by rest. Her DM seems to be under better control than at her September visit according to her reported home glucose readings, but yet uncontrolled. Unfortunately she continues to smoke. Today's left LE arterial Duplex reveals an occluded left femoropopliteal bypass graft. The graft was widely patent at her September 2015 visit according to the Duplex that day. Left ABI has worsened from normal with triphasic waveforms to moderate arterial occlusive disease with monophasic waveforms. She has no tissue loss.  She has outflow via collaterals distal to the occluded graft.   She will need a follow up carotid Duplex about March of this year, a year after her last carotid Duplex which revealed minimal bilateral ICA stenoses.  Face to face time with patient was 30 minutes. Over 50% of this time was spent on counseling and coordination of care.   PLAN:  Graduated walking program. The patient was counseled re smoking cessation and given several free resources re smoking cessation. Maximal medical management of atherosclerotic risk factors including DM.  I discussed in depth with the patient the nature of atherosclerosis, and emphasized the importance of maximal medical management including strict control of blood pressure,  blood glucose, and lipid levels, obtaining regular exercise, and cessation of smoking.  The patient is aware that without maximal medical management the underlying atherosclerotic disease process will progress, limiting the benefit of any interventions.  Based on the patient's vascular studies and examination, and after discussing with Dr. Scot Dock, pt will be scheduled for an arteriogram with bilateral run off, possible intervention for occlusion of left femoropopliteal bypass graft, by Dr. Oneida Alar at the end of this month or beginning of next month, non urgent.  The patient was given information about PAD including signs, symptoms, treatment, what symptoms should prompt the patient to seek immediate medical care, and risk reduction measures to take.  Clemon Chambers, RN, MSN, FNP-C [facility] Phone: [phone]  Clinic MD: Scot Dock  03/07/2014 4:34 pm

## 2014-03-07 NOTE — Patient Instructions (Signed)
Peripheral Vascular Disease Peripheral Vascular Disease (PVD), also called Peripheral Arterial Disease (PAD), is a circulation problem caused by cholesterol (atherosclerotic plaque) deposits in the arteries. PVD commonly occurs in the lower extremities (legs) but it can occur in other areas of the body, such as your arms. The cholesterol buildup in the arteries reduces blood flow which can cause pain and other serious problems. The presence of PVD can place a person at risk for Coronary Artery Disease (CAD).  CAUSES  Causes of PVD can be many. It is usually associated with more than one risk factor such as:   High Cholesterol.  Smoking.  Diabetes.  Lack of exercise or inactivity.  High blood pressure (hypertension).  Obesity.  Family history. SYMPTOMS   When the lower extremities are affected, patients with PVD may experience:  Leg pain with exertion or physical activity. This is called INTERMITTENT CLAUDICATION. This may present as cramping or numbness with physical activity. The location of the pain is associated with the level of blockage. For example, blockage at the abdominal level (distal abdominal aorta) may result in buttock or hip pain. Lower leg arterial blockage may result in calf pain.  As PVD becomes more severe, pain can develop with less physical activity.  In people with severe PVD, leg pain may occur at rest.  Other PVD signs and symptoms:  Leg numbness or weakness.  Coldness in the affected leg or foot, especially when compared to the other leg.  A change in leg color.  Patients with significant PVD are more prone to ulcers or sores on toes, feet or legs. These may take longer to heal or may reoccur. The ulcers or sores can become infected.  If signs and symptoms of PVD are ignored, gangrene may occur. This can result in the loss of toes or loss of an entire limb.  Not all leg pain is related to PVD. Other medical conditions can cause leg pain such  as:  Blood clots (embolism) or Deep Vein Thrombosis.  Inflammation of the blood vessels (vasculitis).  Spinal stenosis. DIAGNOSIS  Diagnosis of PVD can involve several different types of tests. These can include:  Pulse Volume Recording Method (PVR). This test is simple, painless and does not involve the use of X-rays. PVR involves measuring and comparing the blood pressure in the arms and legs. An ABI (Ankle-Brachial Index) is calculated. The normal ratio of blood pressures is 1. As this number becomes smaller, it indicates more severe disease.  < 0.95 - indicates significant narrowing in one or more leg vessels.  <0.8 - there will usually be pain in the foot, leg or buttock with exercise.  <0.4 - will usually have pain in the legs at rest.  <0.25 - usually indicates limb threatening PVD.  Doppler detection of pulses in the legs. This test is painless and checks to see if you have a pulses in your legs/feet.  A dye or contrast material (a substance that highlights the blood vessels so they show up on x-ray) may be given to help your caregiver better see the arteries for the following tests. The dye is eliminated from your body by the kidney's. Your caregiver may order blood work to check your kidney function and other laboratory values before the following tests are performed:  Magnetic Resonance Angiography (MRA). An MRA is a picture study of the blood vessels and arteries. The MRA machine uses a large magnet to produce images of the blood vessels.  Computed Tomography Angiography (CTA). A CTA   is a specialized x-ray that looks at how the blood flows in your blood vessels. An IV may be inserted into your arm so contrast dye can be injected.  Angiogram. Is a procedure that uses x-rays to look at your blood vessels. This procedure is minimally invasive, meaning a small incision (cut) is made in your groin. A small tube (catheter) is then inserted into the artery of your groin. The catheter  is guided to the blood vessel or artery your caregiver wants to examine. Contrast dye is injected into the catheter. X-rays are then taken of the blood vessel or artery. After the images are obtained, the catheter is taken out. TREATMENT  Treatment of PVD involves many interventions which may include:  Lifestyle changes:  Quitting smoking.  Exercise.  Following a low fat, low cholesterol diet.  Control of diabetes.  Foot care is very important to the PVD patient. Good foot care can help prevent infection.  Medication:  Cholesterol-lowering medicine.  Blood pressure medicine.  Anti-platelet drugs.  Certain medicines may reduce symptoms of Intermittent Claudication.  Interventional/Surgical options:  Angioplasty. An Angioplasty is a procedure that inflates a balloon in the blocked artery. This opens the blocked artery to improve blood flow.  Stent Implant. A wire mesh tube (stent) is placed in the artery. The stent expands and stays in place, allowing the artery to remain open.  Peripheral Bypass Surgery. This is a surgical procedure that reroutes the blood around a blocked artery to help improve blood flow. This type of procedure may be performed if Angioplasty or stent implants are not an option. SEEK IMMEDIATE MEDICAL CARE IF:   You develop pain or numbness in your arms or legs.  Your arm or leg turns cold, becomes blue in color.  You develop redness, warmth, swelling and pain in your arms or legs. MAKE SURE YOU:   Understand these instructions.  Will watch your condition.  Will get help right away if you are not doing well or get worse. Document Released: 03/19/2004 Document Revised: 05/04/2011 Document Reviewed: 02/14/2008 ExitCare Patient Information 2015 [employer]. This information is not intended to replace advice given to you by your health care provider. Make sure you discuss any questions you have with your health care provider.    Smoking  Cessation Quitting smoking is important to your health and has many advantages. However, it is not always easy to quit since nicotine is a very addictive drug. Oftentimes, people try 3 times or more before being able to quit. This document explains the best ways for you to prepare to quit smoking. Quitting takes hard work and a lot of effort, but you can do it. ADVANTAGES OF QUITTING SMOKING  You will live longer, feel better, and live better.  Your body will feel the impact of quitting smoking almost immediately.  Within 20 minutes, blood pressure decreases. Your pulse returns to its normal level.  After 8 hours, carbon monoxide levels in the blood return to normal. Your oxygen level increases.  After 24 hours, the chance of having a heart attack starts to decrease. Your breath, hair, and body stop smelling like smoke.  After 48 hours, damaged nerve endings begin to recover. Your sense of taste and smell improve.  After 72 hours, the body is virtually free of nicotine. Your bronchial tubes relax and breathing becomes easier.  After 2 to 12 weeks, lungs can hold more air. Exercise becomes easier and circulation improves.  The risk of having a heart attack, stroke,   cancer, or lung disease is greatly reduced.  After 1 year, the risk of coronary heart disease is cut in half.  After 5 years, the risk of stroke falls to the same as a nonsmoker.  After 10 years, the risk of lung cancer is cut in half and the risk of other cancers decreases significantly.  After 15 years, the risk of coronary heart disease drops, usually to the level of a nonsmoker.  If you are pregnant, quitting smoking will improve your chances of having a healthy baby.  The people you live with, especially any children, will be healthier.  You will have extra money to spend on things other than cigarettes. QUESTIONS TO THINK ABOUT BEFORE ATTEMPTING TO QUIT You may want to talk about your answers with your health care  provider.  Why do you want to quit?  If you tried to quit in the past, what helped and what did not?  What will be the most difficult situations for you after you quit? How will you plan to handle them?  Who can help you through the tough times? Your family? Friends? A health care provider?  What pleasures do you get from smoking? What ways can you still get pleasure if you quit? Here are some questions to ask your health care provider:  How can you help me to be successful at quitting?  What medicine do you think would be best for me and how should I take it?  What should I do if I need more help?  What is smoking withdrawal like? How can I get information on withdrawal? GET READY  Set a quit date.  Change your environment by getting rid of all cigarettes, ashtrays, matches, and lighters in your home, car, or work. Do not let people smoke in your home.  Review your past attempts to quit. Think about what worked and what did not. GET SUPPORT AND ENCOURAGEMENT You have a better chance of being successful if you have help. You can get support in many ways.  Tell your family, friends, and coworkers that you are going to quit and need their support. Ask them not to smoke around you.  Get individual, group, or telephone counseling and support. Programs are available at local hospitals and health centers. Call your local health department for information about programs in your area.  Spiritual beliefs and practices may help some smokers quit.  Download a "quit meter" on your computer to keep track of quit statistics, such as how long you have gone without smoking, cigarettes not smoked, and money saved.  Get a self-help book about quitting smoking and staying off tobacco. LEARN NEW SKILLS AND BEHAVIORS  Distract yourself from urges to smoke. Talk to someone, go for a walk, or occupy your time with a task.  Change your normal routine. Take a different route to work. Drink tea  instead of coffee. Eat breakfast in a different place.  Reduce your stress. Take a hot bath, exercise, or read a book.  Plan something enjoyable to do every day. Reward yourself for not smoking.  Explore interactive web-based programs that specialize in helping you quit. GET MEDICINE AND USE IT CORRECTLY Medicines can help you stop smoking and decrease the urge to smoke. Combining medicine with the above behavioral methods and support can greatly increase your chances of successfully quitting smoking.  Nicotine replacement therapy helps deliver nicotine to your body without the negative effects and risks of smoking. Nicotine replacement therapy includes nicotine gum, lozenges,   inhalers, nasal sprays, and skin patches. Some may be available over-the-counter and others require a prescription.  Antidepressant medicine helps people abstain from smoking, but how this works is unknown. This medicine is available by prescription.  Nicotinic receptor partial agonist medicine simulates the effect of nicotine in your brain. This medicine is available by prescription. Ask your health care provider for advice about which medicines to use and how to use them based on your health history. Your health care provider will tell you what side effects to look out for if you choose to be on a medicine or therapy. Carefully read the information on the package. Do not use any other product containing nicotine while using a nicotine replacement product.  RELAPSE OR DIFFICULT SITUATIONS Most relapses occur within the first 3 months after quitting. Do not be discouraged if you start smoking again. Remember, most people try several times before finally quitting. You may have symptoms of withdrawal because your body is used to nicotine. You may crave cigarettes, be irritable, feel very hungry, cough often, get headaches, or have difficulty concentrating. The withdrawal symptoms are only temporary. They are strongest when you  first quit, but they will go away within 10-14 days. To reduce the chances of relapse, try to:  Avoid drinking alcohol. Drinking lowers your chances of successfully quitting.  Reduce the amount of caffeine you consume. Once you quit smoking, the amount of caffeine in your body increases and can give you symptoms, such as a rapid heartbeat, sweating, and anxiety.  Avoid smokers because they can make you want to smoke.  Do not let weight gain distract you. Many smokers will gain weight when they quit, usually less than 10 pounds. Eat a healthy diet and stay active. You can always lose the weight gained after you quit.  Find ways to improve your mood other than smoking. FOR MORE INFORMATION  www.smokefree.gov  Document Released: 02/03/2001 Document Revised: 06/26/2013 Document Reviewed: 05/21/2011 Exit[employer] Patient Information 2015 [employer]. This information is not intended to replace advice given to you by your health care provider. Make sure you discuss any questions you have with your health care provider.    Smoking Cessation, Tips for Success If you are ready to quit smoking, congratulations! You have chosen to help yourself be healthier. Cigarettes bring nicotine, tar, carbon monoxide, and other irritants into your body. Your lungs, heart, and blood vessels will be able to work better without these poisons. There are many different ways to quit smoking. Nicotine gum, nicotine patches, a nicotine inhaler, or nicotine nasal spray can help with physical craving. Hypnosis, support groups, and medicines help break the habit of smoking. WHAT THINGS CAN I DO TO MAKE QUITTING EASIER?  Here are some tips to help you quit for good:  Pick a date when you will quit smoking completely. Tell all of your friends and family about your plan to quit on that date.  Do not try to slowly cut down on the number of cigarettes you are smoking. Pick a quit date and quit smoking completely starting on that  day.  Throw away all cigarettes.   Clean and remove all ashtrays from your home, work, and car.  On a card, write down your reasons for quitting. Carry the card with you and read it when you get the urge to smoke.  Cleanse your body of nicotine. Drink enough water and fluids to keep your urine clear or pale yellow. Do this after quitting to flush the nicotine from   your body.  Learn to predict your moods. Do not let a bad situation be your excuse to have a cigarette. Some situations in your life might tempt you into wanting a cigarette.  Never have "just one" cigarette. It leads to wanting another and another. Remind yourself of your decision to quit.  Change habits associated with smoking. If you smoked while driving or when feeling stressed, try other activities to replace smoking. Stand up when drinking your coffee. Brush your teeth after eating. Sit in a different chair when you read the paper. Avoid alcohol while trying to quit, and try to drink fewer caffeinated beverages. Alcohol and caffeine may urge you to smoke.  Avoid foods and drinks that can trigger a desire to smoke, such as sugary or spicy foods and alcohol.  Ask people who smoke not to smoke around you.  Have something planned to do right after eating or having a cup of coffee. For example, plan to take a walk or exercise.  Try a relaxation exercise to calm you down and decrease your stress. Remember, you may be tense and nervous for the first 2 weeks after you quit, but this will pass.  Find new activities to keep your hands busy. Play with a pen, coin, or rubber band. Doodle or draw things on paper.  Brush your teeth right after eating. This will help cut down on the craving for the taste of tobacco after meals. You can also try mouthwash.   Use oral substitutes in place of cigarettes. Try using lemon drops, carrots, cinnamon sticks, or chewing gum. Keep them handy so they are available when you have the urge to  smoke.  When you have the urge to smoke, try deep breathing.  Designate your home as a nonsmoking area.  If you are a heavy smoker, ask your health care provider about a prescription for nicotine chewing gum. It can ease your withdrawal from nicotine.  Reward yourself. Set aside the cigarette money you save and buy yourself something nice.  Look for support from others. Join a support group or smoking cessation program. Ask someone at home or at work to help you with your plan to quit smoking.  Always ask yourself, "Do I need this cigarette or is this just a reflex?" Tell yourself, "Today, I choose not to smoke," or "I do not want to smoke." You are reminding yourself of your decision to quit.  Do not replace cigarette smoking with electronic cigarettes (commonly called e-cigarettes). The safety of e-cigarettes is unknown, and some may contain harmful chemicals.  If you relapse, do not give up! Plan ahead and think about what you will do the next time you get the urge to smoke. HOW WILL I FEEL WHEN I QUIT SMOKING? You may have symptoms of withdrawal because your body is used to nicotine (the addictive substance in cigarettes). You may crave cigarettes, be irritable, feel very hungry, cough often, get headaches, or have difficulty concentrating. The withdrawal symptoms are only temporary. They are strongest when you first quit but will go away within 10-14 days. When withdrawal symptoms occur, stay in control. Think about your reasons for quitting. Remind yourself that these are signs that your body is healing and getting used to being without cigarettes. Remember that withdrawal symptoms are easier to treat than the major diseases that smoking can cause.  Even after the withdrawal is over, expect periodic urges to smoke. However, these cravings are generally short lived and will go away whether you   smoke or not. Do not smoke! WHAT RESOURCES ARE AVAILABLE TO HELP ME QUIT SMOKING? Your health care  provider can direct you to community resources or hospitals for support, which may include:  Group support.  Education.  Hypnosis.  Therapy. Document Released: 11/08/2003 Document Revised: 06/26/2013 Document Reviewed: 07/28/2012 ExitCare Patient Information 2015 [employer]. This information is not intended to replace advice given to you by your health care provider. Make sure you discuss any questions you have with your health care provider.  

## 2014-03-09 ENCOUNTER — Other Ambulatory Visit: Payer: Self-pay

## 2014-03-15 MED ORDER — SODIUM CHLORIDE 0.9 % IV SOLN
INTRAVENOUS | Status: DC
Start: 2014-03-15 — End: 2014-03-16
  Administered 2014-03-16 (×2): via INTRAVENOUS

## 2014-03-16 ENCOUNTER — Encounter (HOSPITAL_COMMUNITY): Payer: Self-pay | Admitting: Vascular Surgery

## 2014-03-16 ENCOUNTER — Observation Stay (HOSPITAL_COMMUNITY)
Admission: RE | Admit: 2014-03-16 | Discharge: 2014-03-17 | Disposition: A | Discharge status: Home / Self Care | Payer: Medicare HMO | Source: Ambulatory Visit | Attending: Vascular Surgery | Admitting: Vascular Surgery

## 2014-03-16 ENCOUNTER — Encounter (HOSPITAL_COMMUNITY)
Admission: RE | Disposition: A | Discharge status: Home / Self Care | Payer: Self-pay | Source: Ambulatory Visit | Attending: Vascular Surgery

## 2014-03-16 DIAGNOSIS — I25110 Atherosclerotic heart disease of native coronary artery with unstable angina pectoris: Secondary | ICD-10-CM | POA: Insufficient documentation

## 2014-03-16 DIAGNOSIS — E039 Hypothyroidism, unspecified: Secondary | ICD-10-CM | POA: Insufficient documentation

## 2014-03-16 DIAGNOSIS — I739 Peripheral vascular disease, unspecified: Secondary | ICD-10-CM | POA: Diagnosis present

## 2014-03-16 DIAGNOSIS — J449 Chronic obstructive pulmonary disease, unspecified: Secondary | ICD-10-CM | POA: Insufficient documentation

## 2014-03-16 DIAGNOSIS — F419 Anxiety disorder, unspecified: Secondary | ICD-10-CM | POA: Diagnosis not present

## 2014-03-16 DIAGNOSIS — Z8673 Personal history of transient ischemic attack (TIA), and cerebral infarction without residual deficits: Secondary | ICD-10-CM | POA: Insufficient documentation

## 2014-03-16 DIAGNOSIS — Z85528 Personal history of other malignant neoplasm of kidney: Secondary | ICD-10-CM | POA: Insufficient documentation

## 2014-03-16 DIAGNOSIS — E1140 Type 2 diabetes mellitus with diabetic neuropathy, unspecified: Secondary | ICD-10-CM | POA: Insufficient documentation

## 2014-03-16 DIAGNOSIS — T82898A Other specified complication of vascular prosthetic devices, implants and grafts, initial encounter: Secondary | ICD-10-CM | POA: Diagnosis not present

## 2014-03-16 DIAGNOSIS — I6529 Occlusion and stenosis of unspecified carotid artery: Secondary | ICD-10-CM | POA: Diagnosis not present

## 2014-03-16 DIAGNOSIS — Z794 Long term (current) use of insulin: Secondary | ICD-10-CM | POA: Insufficient documentation

## 2014-03-16 DIAGNOSIS — E785 Hyperlipidemia, unspecified: Secondary | ICD-10-CM | POA: Diagnosis not present

## 2014-03-16 DIAGNOSIS — K219 Gastro-esophageal reflux disease without esophagitis: Secondary | ICD-10-CM | POA: Diagnosis not present

## 2014-03-16 DIAGNOSIS — F17210 Nicotine dependence, cigarettes, uncomplicated: Secondary | ICD-10-CM | POA: Diagnosis not present

## 2014-03-16 DIAGNOSIS — I252 Old myocardial infarction: Secondary | ICD-10-CM | POA: Insufficient documentation

## 2014-03-16 DIAGNOSIS — Z7902 Long term (current) use of antithrombotics/antiplatelets: Secondary | ICD-10-CM | POA: Diagnosis not present

## 2014-03-16 DIAGNOSIS — I70212 Atherosclerosis of native arteries of extremities with intermittent claudication, left leg: Secondary | ICD-10-CM

## 2014-03-16 HISTORY — PX: ABDOMINAL AORTAGRAM: SHX5454

## 2014-03-16 LAB — POCT I-STAT, CHEM 8
BUN: 37 mg/dL — ABNORMAL HIGH (ref 6–23)
CALCIUM ION: 1.13 mmol/L (ref 1.13–1.30)
CHLORIDE: 100 meq/L (ref 96–112)
CREATININE: 1.7 mg/dL — AB (ref 0.50–1.10)
GLUCOSE: 214 mg/dL — AB (ref 70–99)
HCT: 41 % (ref 36.0–46.0)
Hemoglobin: 13.9 g/dL (ref 12.0–15.0)
Potassium: 4.3 mmol/L (ref 3.5–5.1)
Sodium: 135 mmol/L (ref 135–145)
TCO2: 23 mmol/L (ref 0–100)

## 2014-03-16 LAB — GLUCOSE, CAPILLARY
GLUCOSE-CAPILLARY: 217 mg/dL — AB (ref 70–99)
Glucose-Capillary: 178 mg/dL — ABNORMAL HIGH (ref 70–99)
Glucose-Capillary: 223 mg/dL — ABNORMAL HIGH (ref 70–99)

## 2014-03-16 SURGERY — ABDOMINAL AORTAGRAM
Anesthesia: LOCAL

## 2014-03-16 MED ORDER — DIPHENHYDRAMINE HCL 25 MG PO TABS: 25.0000 mg | ORAL_TABLET | Freq: Four times a day (QID) | ORAL | Status: DC | PRN

## 2014-03-16 MED ORDER — CLOPIDOGREL BISULFATE 75 MG PO TABS
75.0000 mg | ORAL_TABLET | Freq: Every day | ORAL | Status: DC
  Administered 2014-03-16 – 2014-03-17 (×2): 75 mg via ORAL
  Filled 2014-03-16 (×2): qty 1

## 2014-03-16 MED ORDER — ALUM & MAG HYDROXIDE-SIMETH 200-200-20 MG/5ML PO SUSP: 15.0000 mL | ORAL | Status: DC | PRN

## 2014-03-16 MED ORDER — DULOXETINE HCL 30 MG PO CPEP
30.0000 mg | ORAL_CAPSULE | Freq: Every day | ORAL | Status: DC
  Administered 2014-03-16: 30 mg via ORAL
  Filled 2014-03-16 (×2): qty 1

## 2014-03-16 MED ORDER — POTASSIUM CHLORIDE CRYS ER 20 MEQ PO TBCR: 20.0000 meq | EXTENDED_RELEASE_TABLET | Freq: Every day | ORAL | Status: DC | PRN

## 2014-03-16 MED ORDER — ACETAMINOPHEN 650 MG RE SUPP: 325.0000 mg | RECTAL | Status: DC | PRN

## 2014-03-16 MED ORDER — MORPHINE SULFATE 2 MG/ML IJ SOLN
2.0000 mg | INTRAMUSCULAR | Status: DC | PRN
  Administered 2014-03-16: 2 mg via INTRAVENOUS
  Filled 2014-03-16: qty 1

## 2014-03-16 MED ORDER — CLONIDINE HCL 0.3 MG PO TABS
0.3000 mg | ORAL_TABLET | Freq: Every day | ORAL | Status: DC
  Administered 2014-03-17: 0.3 mg via ORAL
  Filled 2014-03-16 (×2): qty 1

## 2014-03-16 MED ORDER — HEPARIN (PORCINE) IN NACL 2-0.9 UNIT/ML-% IJ SOLN
INTRAMUSCULAR | Status: AC
  Filled 2014-03-16: qty 1000

## 2014-03-16 MED ORDER — PRAVASTATIN SODIUM 20 MG PO TABS
20.0000 mg | ORAL_TABLET | Freq: Every day | ORAL | Status: DC
  Administered 2014-03-16: 20 mg via ORAL
  Filled 2014-03-16 (×2): qty 1

## 2014-03-16 MED ORDER — FENTANYL CITRATE 0.05 MG/ML IJ SOLN
INTRAMUSCULAR | Status: AC
  Filled 2014-03-16: qty 2

## 2014-03-16 MED ORDER — MIDAZOLAM HCL 2 MG/2ML IJ SOLN
INTRAMUSCULAR | Status: AC
  Filled 2014-03-16: qty 2

## 2014-03-16 MED ORDER — INSULIN ASPART 100 UNIT/ML ~~LOC~~ SOLN: 5.0000 [IU] | Freq: Three times a day (TID) | SUBCUTANEOUS | Status: DC | PRN

## 2014-03-16 MED ORDER — HYDRALAZINE HCL 20 MG/ML IJ SOLN: 5.0000 mg | INTRAMUSCULAR | Status: DC | PRN

## 2014-03-16 MED ORDER — LEVOTHYROXINE SODIUM 125 MCG PO TABS
125.0000 ug | ORAL_TABLET | Freq: Every day | ORAL | Status: DC
  Administered 2014-03-16 – 2014-03-17 (×2): 125 ug via ORAL
  Filled 2014-03-16 (×3): qty 1

## 2014-03-16 MED ORDER — PROMETHAZINE HCL 25 MG PO TABS: 25.0000 mg | ORAL_TABLET | Freq: Four times a day (QID) | ORAL | Status: DC | PRN

## 2014-03-16 MED ORDER — MORPHINE SULFATE 2 MG/ML IJ SOLN
INTRAMUSCULAR | Status: AC
  Administered 2014-03-16: 2 mg via INTRAVENOUS
  Filled 2014-03-16: qty 1

## 2014-03-16 MED ORDER — GUAIFENESIN-DM 100-10 MG/5ML PO SYRP: 15.0000 mL | ORAL_SOLUTION | ORAL | Status: DC | PRN

## 2014-03-16 MED ORDER — ONDANSETRON HCL 4 MG/2ML IJ SOLN: 4.0000 mg | Freq: Four times a day (QID) | INTRAMUSCULAR | Status: DC | PRN

## 2014-03-16 MED ORDER — HYDROCODONE-ACETAMINOPHEN 10-325 MG PO TABS
1.0000 | ORAL_TABLET | ORAL | Status: DC | PRN
  Administered 2014-03-16 – 2014-03-17 (×5): 1 via ORAL
  Filled 2014-03-16 (×5): qty 1

## 2014-03-16 MED ORDER — PHENOL 1.4 % MT LIQD
1.0000 | OROMUCOSAL | Status: DC | PRN
  Filled 2014-03-16: qty 177

## 2014-03-16 MED ORDER — HYDROCHLOROTHIAZIDE 25 MG PO TABS
25.0000 mg | ORAL_TABLET | Freq: Every day | ORAL | Status: DC
  Administered 2014-03-17: 25 mg via ORAL
  Filled 2014-03-16 (×2): qty 1

## 2014-03-16 MED ORDER — DOCUSATE SODIUM 100 MG PO CAPS
100.0000 mg | ORAL_CAPSULE | Freq: Every day | ORAL | Status: DC
  Administered 2014-03-17: 100 mg via ORAL
  Filled 2014-03-16: qty 1

## 2014-03-16 MED ORDER — HYDRALAZINE HCL 25 MG PO TABS
25.0000 mg | ORAL_TABLET | Freq: Two times a day (BID) | ORAL | Status: DC
  Administered 2014-03-16 – 2014-03-17 (×2): 25 mg via ORAL
  Filled 2014-03-16 (×4): qty 1

## 2014-03-16 MED ORDER — LIDOCAINE HCL (PF) 1 % IJ SOLN
INTRAMUSCULAR | Status: AC
  Filled 2014-03-16: qty 30

## 2014-03-16 MED ORDER — LABETALOL HCL 5 MG/ML IV SOLN
10.0000 mg | INTRAVENOUS | Status: DC | PRN
  Filled 2014-03-16: qty 4

## 2014-03-16 MED ORDER — SODIUM CHLORIDE 0.45 % IV SOLN
INTRAVENOUS | Status: DC
  Administered 2014-03-16 (×2): via INTRAVENOUS

## 2014-03-16 MED ORDER — AMITRIPTYLINE HCL 75 MG PO TABS
75.0000 mg | ORAL_TABLET | Freq: Every day | ORAL | Status: DC
  Administered 2014-03-16: 75 mg via ORAL
  Filled 2014-03-16 (×2): qty 1

## 2014-03-16 MED ORDER — CYCLOBENZAPRINE HCL 10 MG PO TABS: 10.0000 mg | ORAL_TABLET | Freq: Every evening | ORAL | Status: DC | PRN

## 2014-03-16 MED ORDER — MAGNESIUM SULFATE 2 GM/50ML IV SOLN
2.0000 g | Freq: Every day | INTRAVENOUS | Status: DC | PRN
  Filled 2014-03-16: qty 50

## 2014-03-16 MED ORDER — PROMETHAZINE HCL 25 MG RE SUPP
25.0000 mg | Freq: Four times a day (QID) | RECTAL | Status: DC | PRN
  Filled 2014-03-16: qty 1

## 2014-03-16 MED ORDER — PANTOPRAZOLE SODIUM 20 MG PO TBEC
20.0000 mg | DELAYED_RELEASE_TABLET | Freq: Every day | ORAL | Status: DC
Start: 2014-03-16 — End: 2014-03-17
  Administered 2014-03-16 – 2014-03-17 (×2): 20 mg via ORAL
  Filled 2014-03-16 (×2): qty 1

## 2014-03-16 MED ORDER — DIPHENHYDRAMINE HCL 25 MG PO TABS: 50.0000 mg | ORAL_TABLET | ORAL | Status: DC | PRN

## 2014-03-16 MED ORDER — ACETAMINOPHEN 325 MG PO TABS: 325.0000 mg | ORAL_TABLET | ORAL | Status: DC | PRN

## 2014-03-16 MED ORDER — METOPROLOL TARTRATE 1 MG/ML IV SOLN: 2.0000 mg | INTRAVENOUS | Status: DC | PRN

## 2014-03-16 MED ORDER — AMLODIPINE BESYLATE 10 MG PO TABS
10.0000 mg | ORAL_TABLET | Freq: Every day | ORAL | Status: DC
  Administered 2014-03-17: 10 mg via ORAL
  Filled 2014-03-16 (×2): qty 1

## 2014-03-16 MED ORDER — INSULIN GLARGINE 100 UNIT/ML ~~LOC~~ SOLN
80.0000 [IU] | Freq: Every day | SUBCUTANEOUS | Status: DC
  Administered 2014-03-16: 80 [IU] via SUBCUTANEOUS
  Filled 2014-03-16 (×2): qty 0.8

## 2014-03-16 MED ORDER — METHOCARBAMOL 500 MG PO TABS
500.0000 mg | ORAL_TABLET | Freq: Two times a day (BID) | ORAL | Status: DC | PRN
  Filled 2014-03-16: qty 1

## 2014-03-16 MED ORDER — LORAZEPAM 1 MG PO TABS
3.0000 mg | ORAL_TABLET | Freq: Every day | ORAL | Status: DC
  Administered 2014-03-16: 3 mg via ORAL
  Filled 2014-03-16: qty 3

## 2014-03-16 MED ORDER — MORPHINE SULFATE 10 MG/ML IJ SOLN: 2.0000 mg | INTRAMUSCULAR | Status: DC | PRN

## 2014-03-16 NOTE — Op Note (Signed)
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 The patient was placed in supine position on the Angio table. Both groins were prepped and draped in usual sterile fashion. Local anesthesia was a fair of the right common femoral artery. Ultrasound was used to identify the right common femoral artery. An introducer needle was then used to cannulate the right common femoral artery. An 035 versacore wire was then threaded up into the abdominal aorta under fluoroscopic guidance. A 5 French sheathover the guidewire the right common femoral artery. This was thoroughly flushed with heparinized saline. A 5 French pig catheter was then placed over the guidewire and advanced up into the abdominal aorta. Abdominal aortogram was then obtained in AP projection. The left and right renal arteries are patent. The infrarenal abdominal aorta is patent. The left and right common external and internal iliac arteries are all patent. Next the pigtail catheter was pulled back over a guidewire exchange for a 5 [place] crossover catheter. This was used to selectively catheterize the left common iliac artery. I initially attempted to advance an 035 versacore wire into the distal left external iliac artery but this would not go easily. Therefore the guidewire was exchanged for an 035 angled Glidewire and also to direct this into the distal left external iliac artery. The 5 [place] crossover catheter was then removed over the wire and exchanged for a 5 French straight catheter. The left lower extremity arteriogram was obtained through this  catheter.  In the left lower extremity, the left common femoral artery is patent. The profunda femoris artery is patent. It gives off abundant collaterals which reconstituted a very diseased above-knee popliteal artery. The left native superficial femoral artery is occluded. The hood of a bypass graft can be seen but the remainder of the bypass graft is occluded. The above-knee popliteal artery reconstitutes via profunda collaterals but is a small and diseased vessel. The below-knee popliteal artery is a more healthy in appearance vessel. There is three-vessel runoff to the left foot. The posterior tibial artery is the primary dominant runoff vessel. At this point the 5 French straight catheter was removed over a guidewire. A 5 French sheath was thoroughly flushed with heparinized saline. The 5 French sheath was removed and hemostasis obtained with direct pressure.  The patient tolerated the procedure well and there were no complications. The patient was taken to the holding area in stable condition.  Operative management: The patient will be scheduled for a redo left femoral to below-knee popliteal bypass in the near future. She will undergo cardiac risk stratification prior to this.  Ruta Hinds, MD [facility] Office: [phone] Pager: [phone]

## 2014-03-16 NOTE — Interval H&P Note (Signed)
History and Physical Interval Note:  03/16/2014 7:04 AM  April Branch  has presented today for surgery, with the diagnosis of occluded fem-pop bypass graft  The various methods of treatment have been discussed with the patient and family. After consideration of risks, benefits and other options for treatment, the patient has consented to  Procedure(s): ABDOMINAL AORTAGRAM (N/A) as a surgical intervention .  The patient's history has been reviewed, patient examined, no change in status, stable for surgery.  I have reviewed the patient's chart and labs.  Questions were answered to the patient's satisfaction.    Pt with short distance claudication approximately 50 ft left leg.  Has known occlusion of left leg bypass.  Informed pt she is not currently at risk of limb loss.  Discussed with her risk of contrast nephropathy with elevated creatinine and possible risk of dialysis.  Also informed her of limited durability of redo bypass especially in light of her smoking.  She was also informed that with redo bypass her risk of limb loss will be higher than with conservative management of walking and smoking cessation.    She states that she understands all of the above possible problems and wishes to proceed with agram for possible redo bypass.  Will give 1 liter saline prior to procedure in order to hydrate kidneys.     Floyd Lusignan E

## 2014-03-16 NOTE — H&P (View-Only) (Signed)
[facility] [place] HISTORY AND PHYSICAL -PAD  History of Present Illness April Branch is a 69 y.o. female patient of Dr. Oneida Alar who is status post left femoropopliteal bypass graft with propaten on 10/12/2011.  She also has a history of carotid artery stenosis.   She returns today with c/o worsening left calf claudication since she fell about a month ago on her left knee, she did not feel that she injured herself at that time and was not medically evaluated, denies non healing wounds She has severe vertigo. States she had brain imaging about 2010 which demonstrated 3 TIA'S, has intermittent dizziness, has tingling and numbness in both hands, weakness in both hands, moreso in left.  Carotid Duplex on file result was from 8/5/20013, showed 60-79% right ICA stenosis and 40-59% left ICA stenosis, requested by Dr. Burt Knack, pt does not recall this.  March, 2015 carotid Duplex demonstrated <40% stenosis in both internal carotid arteries.  Her DM is better controlled than at her September 2015 visit and she continues to smoke.  She has known lumbar spine issues with radiculopathy in both legs.  She gets choked easily, has to drink through a straw.  She has dry cracked fissures on both heals, no infection, no drainage, no other non healing wounds., this is improving.   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  She has intermittent dizziness, has tingling and numbness in both hands, weakness in both hands, moreso in left.  She had severe headaches several years ago which prompted brain imaging, states was found she had 3 ministrokes, she does not recall any TIA symptoms other than the headache, denies lateralizing symptoms.   She was hospitalized recently at [facility] for dehydration, does not know how she got dehydrated.  She denies cough or dyspnea, states her sinuses remain "stopped up", she is currently taking an antibx for a sinus infection,  prescribed by her PCP, per pt.  Pt Diabetic: Yes, uncontrolled but imporved, states her home glucose readings are 100-179. Pt smoker: smoker (1/2 ppd, started at age 59 yrs)  Pt meds include:  Statin :Yes  ASA: No  Other anticoagulants/antiplatelets: Plavix      Past Medical History  Diagnosis Date  . Hyperlipidemia   . Arthritis   . Hypothyroidism   . GERD (gastroesophageal reflux disease)   . Anxiety   . Diabetes mellitus age 34    insulin dependent  . Peripheral vascular disease   . Lumbar disc disease   . Insulin dependent type 2 diabetes mellitus, controlled   . GERD 07/20/2006  . CAD (coronary artery disease)   . CAD (coronary artery disease) of bypass graft   . Vertigo   . History of transient ischemic attack (TIA)   . Myocardial infarction     AGE 74    . Anginal pain   . Hypertension   . Depression   . COPD 12/14/2008    PATIENT DENIES    . Stroke     3 MINI STROKES  RT SIDED WEAKNESS  . Personal history of malignant neoplasm of kidney(V10.52) 12/14/2008    Laparoscopic biopsy and cryoablation 7/08 Dr. Vernie Shanks   . Headache(784.0)   . Neuromuscular disorder     PERIPHERAL NEUROPATHY  . Anemia   . 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 Diabetic coma Feb. 2014  . Fall at home Jan. 2, 2016  Jan. 13, 2016  Left knee    Social History History  Substance Use Topics  . Smoking status: Former Smoker -- 0.50 packs/day for 50 years    Types: Cigarettes    Quit date: 07/12/2011  . Smokeless tobacco: Never Used  . Alcohol Use: No    Family History Family History  Problem Relation Age of Onset  . Heart disease Mother   . Heart disease Father   . Heart disease Sister   . Lung cancer Mother   . Lung cancer Father     Past Surgical History  Procedure Laterality Date  . Appendectomy    . Abdominal hysterectomy    . Knee surgery    . Cholecystectomy    . Lung surgery      lung nodule removed from the right side  . Laparascopic  cryoablation of left kidney  08/2006    Dr. Gladis Riffle for renal cell cancer  . Bladder suspension    . Shoulder surgery    . Foot surgery    . Pr vein bypass graft,aorto-fem-pop  05/27/2010  . Cardiac catheterization    . Femoral-popliteal bypass graft  10/12/2011    Procedure: BYPASS GRAFT FEMORAL-POPLITEAL ARTERY;  Surgeon: Elam Dutch, MD;  Location: [facility] OR;  Service: Vascular;  Laterality: Left;  Left Femoral-Popliteal Bypass Graft using 50mm x 80cm Propaten Graft with intraop arteriogram times one.  . Intraoperative arteriogram  10/12/2011    Procedure: INTRA OPERATIVE ARTERIOGRAM;  Surgeon: Elam Dutch, MD;  Location: [place];  Service: Vascular;  Laterality: Left;  . Left heart catheterization with coronary angiogram N/A 05/11/2011    Procedure: LEFT HEART CATHETERIZATION WITH CORONARY ANGIOGRAM;  Surgeon: Jolaine Artist, MD;  Location: [facility] CATH LAB;  Service: Cardiovascular;  Laterality: N/A;  . Abdominal aortagram N/A 08/21/2011    Procedure: ABDOMINAL Maxcine Ham;  Surgeon: Elam Dutch, MD;  Location: [facility] CATH LAB;  Service: Cardiovascular;  Laterality: N/A;    No Known Allergies  Current Outpatient Prescriptions  Medication Sig Dispense Refill  . Alcohol Swabs (B-D SINGLE USE SWABS REGULAR) PADS     . amitriptyline (ELAVIL) 75 MG tablet Take 75 mg by mouth at bedtime.    Marland Kitchen amLODipine (NORVASC) 10 MG tablet Take 10 mg by mouth daily.     . cloNIDine (CATAPRES) 0.3 MG tablet Take 0.3 mg by mouth daily.     . clopidogrel (PLAVIX) 75 MG tablet Take 75 mg by mouth daily.     . cyclobenzaprine (FLEXERIL) 10 MG tablet Take 10 mg by mouth at bedtime as needed for muscle spasms.     . diphenhydrAMINE (BENADRYL) 25 MG tablet Take 25 mg by mouth every 6 (six) hours as needed for itching or allergies.    . diphenhydrAMINE (BENADRYL) 25 MG tablet Take 2 tablets (50 mg total) by mouth every 4 (four) hours as needed for itching. 20 tablet 0  . DULoxetine (CYMBALTA) 30 MG capsule Take 30  mg by mouth at bedtime.    . hydrALAZINE (APRESOLINE) 25 MG tablet Take 25 mg by mouth 2 (two) times daily.     . hydrochlorothiazide (HYDRODIURIL) 25 MG tablet Take 25 mg by mouth daily.     Marland Kitchen HYDROcodone-acetaminophen (NORCO) 10-325 MG per tablet Take 1 tablet by mouth every 4 (four) hours as needed (pain).     . insulin aspart (NOVOLOG) 100 UNIT/ML injection Inject 5-10 Units into the skin 3 (three) times daily as needed for high blood sugar (cbg over 200). Per sliding scale    .  insulin glargine (LANTUS) 100 UNIT/ML injection Inject 80 Units into the skin at bedtime. Dispense syringes and needles in needed, dispense Lantus pen if approved by insurance.    . lansoprazole (PREVACID) 30 MG capsule Take 30 mg by mouth daily.     Marland Kitchen levothyroxine (SYNTHROID, LEVOTHROID) 88 MCG tablet Take 88 mcg by mouth daily before breakfast.    . LORazepam (ATIVAN) 1 MG tablet Take 3 mg by mouth at bedtime.    . methocarbamol (ROBAXIN) 500 MG tablet Take 1 tablet (500 mg total) by mouth 2 (two) times daily as needed for muscle spasms. 20 tablet 0  . pravastatin (PRAVACHOL) 20 MG tablet Take 20 mg by mouth daily.    . promethazine (PHENERGAN) 25 MG suppository Place 1 suppository (25 mg total) rectally every 6 (six) hours as needed for nausea or vomiting. 2 suppository 0  . tolterodine (DETROL LA) 4 MG 24 hr capsule Take 4 mg by mouth daily.     . promethazine (PHENERGAN) 25 MG tablet Take 1 tablet (25 mg total) by mouth every 6 (six) hours as needed for nausea or vomiting. (Patient not taking: Reported on 03/07/2014) 10 tablet 0  . [DISCONTINUED] Gabapentin (NEURONTIN PO) Take by mouth. New medicine, pt doesn't remember dosage     No current facility-administered medications for this visit.    ROS: See HPI for pertinent positives and negatives.   Physical Examination  Filed Vitals:   03/07/14 1612  BP: 132/69  Pulse: 72  Resp: 16  Height: 5\' 1"  (1.549 m)  Weight: 162 lb (73.483 kg)  SpO2: 98%   Body  mass index is 30.63 kg/(m^2).  General: A&O x 3, WDWN, obese female.  Gait: normal  Eyes: PERRLA.  Pulmonary: fine rales in bases, without wheezes , or rhonchi. No dyspnea. Cardiac: regular Rythm , without detected murmur.   Carotid Bruits  Left  Right    Negative  Negative   Aorta is not palpable.  Radial pulses: right is 1+ palpable, left is not present, left brachial pulse is 2+ palpable   VASCULAR EXAM:  Extremities without ischemic changes  without Gangrene; without open wounds. Heels have some mild cracking.  LE Pulses  LEFT  RIGHT   FEMORAL  2+ palpable  not palpable   POPLITEAL  not palpable  not palpable   POSTERIOR TIBIAL  not palpable  not palpable   DORSALIS PEDIS  ANTERIOR TIBIAL  2+palpable  not palpable    Abdomen: soft, NT, no masses.  Skin: no rashes, no ulcers noted.  Musculoskeletal: no muscle wasting or atrophy.  Neurologic: A&O X 3; Appropriate Affect ; SENSATION: normal; MOTOR FUNCTION: moving all extremities equally, motor strength 5/5 throughout. Speech is fluent/normal. CN 2-12 intact except for uvula deviation to the right.    Non-Invasive Vascular Imaging: DATE: 03/07/2014 LOWER EXTREMITY ARTERIAL DUPLEX EVALUATION    INDICATION: Follow-up left lower extremity graft; patient complains of new onset of left calf pain    PREVIOUS INTERVENTION(S): Left femoropopliteal arterial bypass graft placed 10/12/2011    DUPLEX EXAM:     RIGHT  LEFT   Peak Systolic Velocity (cm/s) Ratio (if abnormal) Waveform  Peak Systolic Velocity (cm/s) Ratio (if abnormal) Waveform     Inflow Artery 127  T     Proximal Anastomosis 0  Absent     Proximal Graft 0  Absent     Mid Graft 0  Absent      Distal Graft 0  Absent     Distal  Anastomosis 0  Absent     Outflow Artery 66  M  0.76 Today's ABI / TBI 0.68  0.73 Previous ABI / TBI (11/02/2013 ) 1.11    Waveform:    M - Monophasic       B - Biphasic       T - Triphasic  If Ankle  Brachial Index (ABI) or Toe Brachial Index (TBI) performed, please see complete report  ADDITIONAL FINDINGS:     IMPRESSION: Occluded left femoropopliteal arterial bypass graft     Compared to the previous exam:  NA     ASSESSMENT: FLAVIA BRUSS is a 69 y.o. female who is status post left femoropopliteal bypass graft with propaten on 10/12/2011. She returns today with worsening left calf claudication.  She fell on her left knee a month ago, did not feel that she had injured herself, but since then has had worsening claudication in her left calf with walking, takes her 45 minutes to walk around a short block since she must stop multiple times, left calf pain is relieved by rest. Her DM seems to be under better control than at her September visit according to her reported home glucose readings, but yet uncontrolled. Unfortunately she continues to smoke. Today's left LE arterial Duplex reveals an occluded left femoropopliteal bypass graft. The graft was widely patent at her September 2015 visit according to the Duplex that day. Left ABI has worsened from normal with triphasic waveforms to moderate arterial occlusive disease with monophasic waveforms. She has no tissue loss.  She has outflow via collaterals distal to the occluded graft.   She will need a follow up carotid Duplex about March of this year, a year after her last carotid Duplex which revealed minimal bilateral ICA stenoses.  Face to face time with patient was 30 minutes. Over 50% of this time was spent on counseling and coordination of care.   PLAN:  Graduated walking program. The patient was counseled re smoking cessation and given several free resources re smoking cessation. Maximal medical management of atherosclerotic risk factors including DM.  I discussed in depth with the patient the nature of atherosclerosis, and emphasized the importance of maximal medical management including strict control of blood pressure,  blood glucose, and lipid levels, obtaining regular exercise, and cessation of smoking.  The patient is aware that without maximal medical management the underlying atherosclerotic disease process will progress, limiting the benefit of any interventions.  Based on the patient's vascular studies and examination, and after discussing with Dr. Scot Dock, pt will be scheduled for an arteriogram with bilateral run off, possible intervention for occlusion of left femoropopliteal bypass graft, by Dr. Oneida Alar at the end of this month or beginning of next month, non urgent.  The patient was given information about PAD including signs, symptoms, treatment, what symptoms should prompt the patient to seek immediate medical care, and risk reduction measures to take.  April Chambers, RN, MSN, FNP-C [facility] Phone: [phone]  Clinic MD: Scot Dock  03/07/2014 4:34 pm

## 2014-03-17 DIAGNOSIS — T82898A Other specified complication of vascular prosthetic devices, implants and grafts, initial encounter: Secondary | ICD-10-CM | POA: Diagnosis not present

## 2014-03-17 LAB — CBC
HCT: 36.4 % (ref 36.0–46.0)
Hemoglobin: 12 g/dL (ref 12.0–15.0)
MCH: 28.2 pg (ref 26.0–34.0)
MCHC: 33 g/dL (ref 30.0–36.0)
MCV: 85.6 fL (ref 78.0–100.0)
Platelets: 259 10*3/uL (ref 150–400)
RBC: 4.25 MIL/uL (ref 3.87–5.11)
RDW: 13.8 % (ref 11.5–15.5)
WBC: 5.9 10*3/uL (ref 4.0–10.5)

## 2014-03-17 LAB — GLUCOSE, CAPILLARY
GLUCOSE-CAPILLARY: 167 mg/dL — AB (ref 70–99)
Glucose-Capillary: 276 mg/dL — ABNORMAL HIGH (ref 70–99)

## 2014-03-17 LAB — BASIC METABOLIC PANEL
Anion gap: 6 (ref 5–15)
BUN: 19 mg/dL (ref 6–23)
CO2: 26 mmol/L (ref 19–32)
Calcium: 8.5 mg/dL (ref 8.4–10.5)
Chloride: 105 mmol/L (ref 96–112)
Creatinine, Ser: 1.15 mg/dL — ABNORMAL HIGH (ref 0.50–1.10)
GFR calc Af Amer: 56 mL/min — ABNORMAL LOW (ref >90)
GFR calc non Af Amer: 48 mL/min — ABNORMAL LOW (ref >90)
Glucose, Bld: 176 mg/dL — ABNORMAL HIGH (ref 70–99)
POTASSIUM: 4.2 mmol/L (ref 3.5–5.1)
Sodium: 137 mmol/L (ref 135–145)

## 2014-03-17 NOTE — Progress Notes (Signed)
   Daily Progress Note  Assessment/Planning: POD #1 s/p Ao, LRo   No complications evident from angiogram  Ok to D/C home if she can get a ride in this weather  Subjective  - 1 Day Post-Op  No complaints  Objective Filed Vitals:   03/16/14 1504 03/16/14 1623 03/16/14 2137 03/17/14 0547  BP: 113/63  120/57 133/61  Pulse: 75  70 82  Temp: 98.3 F (36.8 C)  97.4 F (36.3 C) 98.1 F (36.7 C)  TempSrc: Oral  Oral Oral  Resp: 20  18 18   Height:  5\' 5"  (1.651 m)    Weight:      SpO2: 91%  95% 97%    Intake/Output Summary (Last 24 hours) at 03/17/14 0922 Last data filed at 03/17/14 0549  Gross per 24 hour  Intake    720 ml  Output   1501 ml  Net   -781 ml    PULM  CTAB CV  RRR GI  soft, NTND VASC  R groin without hematoma, no obvious PSA  Laboratory CBC    Component Value Date/Time   WBC 5.9 03/17/2014 0354   HGB 12.0 03/17/2014 0354   HCT 36.4 03/17/2014 0354   PLT 259 03/17/2014 0354    BMET    Component Value Date/Time   NA 137 03/17/2014 0354   K 4.2 03/17/2014 0354   CL 105 03/17/2014 0354   CO2 26 03/17/2014 0354   GLUCOSE 176* 03/17/2014 0354   BUN 19 03/17/2014 0354   CREATININE 1.15* 03/17/2014 0354   CALCIUM 8.5 03/17/2014 0354   GFRNONAA 48* 03/17/2014 0354   GFRAA 56* 03/17/2014 0354    Adele Barthel, MD [facility] [place] Office: [phone] Pager: [phone]  03/17/2014, 9:22 AM

## 2014-03-17 NOTE — Discharge Summary (Signed)
Discharge Summary    April Branch [birth year] 69 y.o. female  [id number]  Admission Date: 03/16/2014  Discharge Date: 03/17/14  Physician: Elam Dutch, MD  Admission Diagnosis: occluded fem-pop bypass graft   HPI:   This is a 69 y.o. female patient of Dr. Oneida Alar who is status post left femoropopliteal bypass graft with propaten on 10/12/2011.  She also has a history of carotid artery stenosis.   She returns today with c/o worsening left calf claudication since she fell about a month ago on her left knee, she did not feel that she injured herself at that time and was not medically evaluated, denies non healing wounds She has severe vertigo. States she had brain imaging about 2010 which demonstrated 3 TIA'S, has intermittent dizziness, has tingling and numbness in both hands, weakness in both hands, moreso in left.  Carotid Duplex on file result was from 8/5/20013, showed 60-79% right ICA stenosis and 40-59% left ICA stenosis, requested by Dr. Burt Knack, pt does not recall this.  March, 2015 carotid Duplex demonstrated <40% stenosis in both internal carotid arteries.  Her DM is better controlled than at her September 2015 visit and she continues to smoke.  She has known lumbar spine issues with radiculopathy in both legs.  She gets choked easily, has to drink through a straw.  She has dry cracked fissures on both heals, no infection, no drainage, no other non healing wounds., this is improving.   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  She has intermittent dizziness, has tingling and numbness in both hands, weakness in both hands, moreso in left.  She had severe headaches several years ago which prompted brain imaging, states was found she had 3 ministrokes, she does not recall any TIA symptoms other than the headache, denies lateralizing symptoms.   She was hospitalized recently at [facility] for dehydration, does not know how she got  dehydrated.  She denies cough or dyspnea, states her sinuses remain "stopped up", she is currently taking an antibx for a sinus infection, prescribed by her PCP, per pt.  Pt Diabetic: Yes, uncontrolled but imporved, states her home glucose readings are 100-179. Pt smoker: smoker (1/2 ppd, started at age 73 yrs)  Pt meds include:  Statin :Yes  ASA: No  Other anticoagulants/antiplatelets: Plavix   Hospital Course:  The patient was admitted to the hospital and taken to the [facility] lab on 03/16/2014 and underwent: Aortogram with left lower extremity runoff  She was admitted to the hospital as she did not have a ride.  Her right groin on POD 1 was soft without hematoma.    Considering the weather, if she can get a ride home, she can discharge home today.  The remainder of the hospital course consisted of increasing mobilization and increasing intake of solids without difficulty.  CBC    Component Value Date/Time   WBC 5.9 03/17/2014 0354   RBC 4.25 03/17/2014 0354   HGB 12.0 03/17/2014 0354   HCT 36.4 03/17/2014 0354   PLT 259 03/17/2014 0354   MCV 85.6 03/17/2014 0354   MCH 28.2 03/17/2014 0354   MCHC 33.0 03/17/2014 0354   RDW 13.8 03/17/2014 0354   LYMPHSABS 1.7 09/09/2013 1229   MONOABS 0.4 09/09/2013 1229   EOSABS 0.2 09/09/2013 1229   BASOSABS 0.0 09/09/2013 1229    BMET    Component Value Date/Time   NA 137 03/17/2014 0354   K 4.2 03/17/2014 0354   CL 105 03/17/2014  0354   CO2 26 03/17/2014 0354   GLUCOSE 176* 03/17/2014 0354   BUN 19 03/17/2014 0354   CREATININE 1.15* 03/17/2014 0354   CALCIUM 8.5 03/17/2014 0354   GFRNONAA 48* 03/17/2014 0354   GFRAA 56* 03/17/2014 0354     Discharge Instructions:   The patient is discharged with extensive instructions on wound care and progressive ambulation.  They are instructed not to drive or perform any heavy lifting until returning to see the physician in his office.  Discharge Instructions    Call MD for:  redness,  tenderness, or signs of infection (pain, swelling, bleeding, redness, odor or green/yellow discharge around incision site)    Complete by:  As directed      Call MD for:  severe or increased pain, loss or decreased feeling  in affected limb(s)    Complete by:  As directed      Call MD for:  temperature >100.5    Complete by:  As directed      Discharge wound care:    Complete by:  As directed   Shower daily with soap and water starting 03/18/14     Driving Restrictions    Complete by:  As directed   No driving for for 24 hours and while taking pain medication.     Lifting restrictions    Complete by:  As directed   No lifting for 3 weeks     Resume previous diet    Complete by:  As directed            Discharge Diagnosis:  occluded fem-pop bypass graft  Secondary Diagnosis: Patient Active Problem List   Diagnosis Date Noted  . PAD (peripheral artery disease) 03/16/2014  . Occlusion and stenosis of carotid artery without mention of cerebral infarction 05/19/2013  . Chest pain 09/21/2012  . Nausea vomiting and diarrhea 09/21/2012  . Acute kidney injury (nontraumatic) 09/21/2012  . Chronic pain syndrome 03/13/2012  . Acute kidney injury 03/13/2012  . Encephalopathy, toxic 03/12/2012  . Pain in limb 10/22/2011  . Atherosclerosis of native arteries of the extremities with intermittent claudication 10/08/2011  . Peripheral vascular disease, unspecified 07/30/2011  . CAD (coronary artery disease) 05/10/2011  . Hyperlipidemia   . Unstable angina 05/09/2011  . Peripheral vascular disease 12/19/2008  . CERVICAL CANCER 12/14/2008  . Personal history of malignant neoplasm of kidney 12/14/2008  . MI 12/14/2008  . COPD 12/14/2008  . IBS 12/14/2008  . Hypertension   . Personal history of colonic polyps 08/13/2008  . DISORDER, TOBACCO USE 08/04/2006  . Hypothyroidism   . Insulin dependent type 2 diabetes mellitus, controlled   . Lumbar disc disease   . ANXIETY STATE NOS 07/20/2006   . GERD 07/20/2006  . HX, PERSONAL, VENOUS THROMBOSIS/EMBOLISM 07/20/2006   Past Medical History  Diagnosis Date  . Hyperlipidemia   . Arthritis   . Hypothyroidism   . GERD (gastroesophageal reflux disease)   . Anxiety   . Diabetes mellitus age 47    insulin dependent  . Peripheral vascular disease   . Lumbar disc disease   . Insulin dependent type 2 diabetes mellitus, controlled   . GERD 07/20/2006  . CAD (coronary artery disease)   . CAD (coronary artery disease) of bypass graft   . Vertigo   . History of transient ischemic attack (TIA)   . Myocardial infarction     AGE 89    . Anginal pain   . Hypertension   . Depression   .  COPD 12/14/2008    PATIENT DENIES    . Stroke     3 MINI STROKES  RT SIDED WEAKNESS  . Personal history of malignant neoplasm of kidney(V10.52) 12/14/2008    Laparoscopic biopsy and cryoablation 7/08 Dr. Vernie Shanks   . Headache(784.0)   . Neuromuscular disorder     PERIPHERAL NEUROPATHY  . Anemia   . 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 Diabetic coma Feb. 2014  . Fall at home Jan. 2, 2016  Jan. 13, 2016    Left knee       Medication List    TAKE these medications        amitriptyline 75 MG tablet  Commonly known as:  ELAVIL  Take 75 mg by mouth at bedtime.     amLODipine 10 MG tablet  Commonly known as:  NORVASC  Take 10 mg by mouth daily.     B-D SINGLE USE SWABS REGULAR Pads     cloNIDine 0.3 MG tablet  Commonly known as:  CATAPRES  Take 0.3 mg by mouth daily.     clopidogrel 75 MG tablet  Commonly known as:  PLAVIX  Take 75 mg by mouth daily.     cyclobenzaprine 10 MG tablet  Commonly known as:  FLEXERIL  Take 10 mg by mouth at bedtime as needed for muscle spasms.     diphenhydrAMINE 25 MG tablet  Commonly known as:  BENADRYL  Take 25 mg by mouth every 6 (six) hours as needed for itching or allergies.     DULoxetine 30 MG capsule  Commonly known as:  CYMBALTA  Take 30 mg by mouth at bedtime.      hydrALAZINE 25 MG tablet  Commonly known as:  APRESOLINE  Take 25 mg by mouth 2 (two) times daily.     hydrochlorothiazide 25 MG tablet  Commonly known as:  HYDRODIURIL  Take 25 mg by mouth daily.     HYDROcodone-acetaminophen 10-325 MG per tablet  Commonly known as:  NORCO  Take 1 tablet by mouth every 4 (four) hours as needed (pain).     insulin aspart 100 UNIT/ML injection  Commonly known as:  novoLOG  Inject 5-10 Units into the skin 3 (three) times daily as needed for high blood sugar (cbg over 200). Per sliding scale     insulin glargine 100 UNIT/ML injection  Commonly known as:  LANTUS  Inject 80 Units into the skin at bedtime. Dispense syringes and needles in needed, dispense Lantus pen if approved by insurance.     lansoprazole 30 MG capsule  Commonly known as:  PREVACID  Take 30 mg by mouth daily.     levothyroxine 125 MCG tablet  Commonly known as:  SYNTHROID, LEVOTHROID  Take 125 mcg by mouth daily before breakfast.     LORazepam 1 MG tablet  Commonly known as:  ATIVAN  Take 3 mg by mouth at bedtime.     methocarbamol 500 MG tablet  Commonly known as:  ROBAXIN  Take 1 tablet (500 mg total) by mouth 2 (two) times daily as needed for muscle spasms.     pravastatin 20 MG tablet  Commonly known as:  PRAVACHOL  Take 20 mg by mouth daily.     promethazine 25 MG suppository  Commonly known as:  PHENERGAN  Place 1 suppository (25 mg total) rectally every 6 (six) hours as needed for nausea or vomiting.     promethazine 25 MG tablet  Commonly known as:  PHENERGAN  Take 1 tablet (25 mg total) by mouth every 6 (six) hours as needed for nausea or vomiting.     tolterodine 4 MG 24 hr capsule  Commonly known as:  DETROL LA  Take 4 mg by mouth daily.        No Rx given  Disposition: home  Patient's condition: is Good  Follow up: 1. Dr. Oneida Alar in 2 weeks   Leontine Locket, PA-C [facility] [phone] 03/17/2014  9:33 AM

## 2014-03-18 ENCOUNTER — Other Ambulatory Visit: Payer: Self-pay | Admitting: *Deleted

## 2014-03-18 DIAGNOSIS — Z01810 Encounter for preprocedural cardiovascular examination: Secondary | ICD-10-CM

## 2014-03-20 ENCOUNTER — Telehealth: Payer: Self-pay | Admitting: Vascular Surgery

## 2014-03-20 NOTE — Telephone Encounter (Signed)
Ms Lavell Ridings is scheduled for cardiac clearance with Dr Johnsie Cancel on 04/12/2014 @ 9:00am. I have left a message for pt to return call about appt, dpm

## 2014-03-20 NOTE — Telephone Encounter (Signed)
-----   Message from Mena Goes, RN sent at 03/18/2014  3:45 PM EST ----- Regarding: Schedule   ----- Message -----    From: Elam Dutch, MD    Sent: 03/16/2014   9:54 AM      To: [facility] Charge Pool  Aortogram with left leg runoff Ultrasound groin 2nd order left external iliac  She needs preop eval by Cardiology  After she had seen cardiology you can pick a date for her for redo left fem pop bypass.  She will need to stop her Plavix 10 days prior to her bypass.  She will need to be on aspirin at the time of bypass  Ruta Hinds

## 2014-03-29 ENCOUNTER — Other Ambulatory Visit: Payer: Self-pay

## 2014-04-09 NOTE — Pre-Procedure Instructions (Signed)
April Branch  04/09/2014   Your procedure is scheduled on:  04-17-2014  Tuesday   Report to [facility] Admitting at 5:30 AM.   Call this number if you have problems the morning of surgery: [phone]   Remember:   Do not eat food or drink liquids after midnight.    Take these medicines the morning of surgery with A SIP OF WATER:amlodipine(Norvas),clonidine(Catapres),hyrdalazine (Apresoline),pain medication as needed,levothyroxine(Synthroid),tolterodine(Detrol LA), LANSOPRAZOLE (PREVACID)              Do not take any insulin the morning of surgery   Do not wear jewelry, make-up or nail polish.   Do not wear lotions, powders, or perfumes. You may not wear deodorant.  Do not shave 48 hours prior to surgery. Men may shave face and neck.   Do not bring valuables to the hospital.  [facility] is not responsible for any belongings or valuables.               Contacts, dentures or bridgework may not be worn into surgery.   Leave suitcase in the car. After surgery it may be brought to your room.  For patients admitted to the hospital, discharge time is determined by your   treatment team.                   Special Instructions: See attached sheet for instructions on CHG shower/bath   Please read over the following fact sheets that you were given: Pain Booklet, Coughing and Deep Breathing, Blood Transfusion Information and Surgical Site Infection Prevention

## 2014-04-10 ENCOUNTER — Encounter (HOSPITAL_COMMUNITY): Payer: Self-pay

## 2014-04-10 ENCOUNTER — Encounter (HOSPITAL_COMMUNITY)
Admission: RE | Admit: 2014-04-10 | Discharge: 2014-04-10 | Disposition: A | Discharge status: Home / Self Care | Payer: Medicare HMO | Source: Ambulatory Visit | Attending: Vascular Surgery | Admitting: Vascular Surgery

## 2014-04-10 DIAGNOSIS — T82392D Other mechanical complication of femoral arterial graft (bypass), subsequent encounter: Secondary | ICD-10-CM | POA: Insufficient documentation

## 2014-04-10 DIAGNOSIS — Z01818 Encounter for other preprocedural examination: Secondary | ICD-10-CM | POA: Diagnosis not present

## 2014-04-10 HISTORY — DX: Papillomavirus as the cause of diseases classified elsewhere: B97.7

## 2014-04-10 HISTORY — DX: Fibromyalgia: M79.7

## 2014-04-10 LAB — CBC
HCT: 36.8 % (ref 36.0–46.0)
HEMOGLOBIN: 12.5 g/dL (ref 12.0–15.0)
MCH: 28.6 pg (ref 26.0–34.0)
MCHC: 34 g/dL (ref 30.0–36.0)
MCV: 84.2 fL (ref 78.0–100.0)
PLATELETS: 316 10*3/uL (ref 150–400)
RBC: 4.37 MIL/uL (ref 3.87–5.11)
RDW: 13.7 % (ref 11.5–15.5)
WBC: 9.2 10*3/uL (ref 4.0–10.5)

## 2014-04-10 LAB — URINALYSIS, ROUTINE W REFLEX MICROSCOPIC
Glucose, UA: 100 mg/dL — AB
HGB URINE DIPSTICK: NEGATIVE
Ketones, ur: NEGATIVE mg/dL
Leukocytes, UA: NEGATIVE
Nitrite: NEGATIVE
Protein, ur: NEGATIVE mg/dL
SPECIFIC GRAVITY, URINE: 1.02 (ref 1.005–1.030)
UROBILINOGEN UA: 1 mg/dL (ref 0.0–1.0)
pH: 5.5 (ref 5.0–8.0)

## 2014-04-10 LAB — COMPREHENSIVE METABOLIC PANEL
ALT: 13 U/L (ref 0–35)
ANION GAP: 11 (ref 5–15)
AST: 16 U/L (ref 0–37)
Albumin: 3.8 g/dL (ref 3.5–5.2)
Alkaline Phosphatase: 114 U/L (ref 39–117)
BILIRUBIN TOTAL: 0.5 mg/dL (ref 0.3–1.2)
BUN: 29 mg/dL — ABNORMAL HIGH (ref 6–23)
CO2: 24 mmol/L (ref 19–32)
CREATININE: 1.3 mg/dL — AB (ref 0.50–1.10)
Calcium: 9.6 mg/dL (ref 8.4–10.5)
Chloride: 102 mmol/L (ref 96–112)
GFR calc non Af Amer: 42 mL/min — ABNORMAL LOW (ref >90)
GFR, EST AFRICAN AMERICAN: 48 mL/min — AB (ref >90)
GLUCOSE: 148 mg/dL — AB (ref 70–99)
Potassium: 4.2 mmol/L (ref 3.5–5.1)
SODIUM: 137 mmol/L (ref 135–145)
Total Protein: 8 g/dL (ref 6.0–8.3)

## 2014-04-10 LAB — PROTIME-INR
INR: 0.99 (ref 0.00–1.49)
PROTHROMBIN TIME: 13.2 s (ref 11.6–15.2)

## 2014-04-10 LAB — TYPE AND SCREEN
ABO/RH(D): O POS
Antibody Screen: NEGATIVE

## 2014-04-10 LAB — APTT: aPTT: 27 seconds (ref 24–37)

## 2014-04-10 LAB — SURGICAL PCR SCREEN
MRSA, PCR: NEGATIVE
Staphylococcus aureus: NEGATIVE

## 2014-04-11 NOTE — Progress Notes (Signed)
Patient ID: April Branch, female   DOB: [birth year], 69 y.o.   MRN: [id number]     Cardiology Office Note   Date:  04/12/2014   ID:  April Branch, DOB [birth year], MRN [id number]  PCP:  [name], MD  Cardiologist:   [name], MD   No chief complaint on file.     History of Present Illness: April Branch is a 69 y.o. female who presents for preop clearance  Needs repeat LE vascular surgery with Dr Oneida Alar  This involves a redo left femoral to below-knee popliteal bypass  Having more exertional claudication no rest pain   Has a prior history of mild coronary artery disease. She had catheterization done previously by Dr. Jaci Standard in 2003 with a 30% circumflex stenosis but not much disease otherwise. She has had a dobutamine Cardiolite study done previously that was nonischemic in 2010. She presented to the emergency room 05/08/11 with substernal chest discomfort  Cath by Dr Jeffie Pollock showed nonobstructive disease and patient D/C home  Has had progressive claudication of LLE Needs repeat fempop on left. Did stop smoking 2 months ago. ? Vertigo and mini strokes. MRI done 07/31/11 no acute changes MRA not done  Carotids 3/15 with <40% bilateral disease   Has some SSCP not always exertional  Took 3 nitro last month   Past Medical History  Diagnosis Date  . Hyperlipidemia   . Arthritis   . Hypothyroidism   . GERD (gastroesophageal reflux disease)   . Anxiety   . Diabetes mellitus age 50    insulin dependent  . Peripheral vascular disease   . Lumbar disc disease   . Insulin dependent type 2 diabetes mellitus, controlled   . GERD 07/20/2006  . CAD (coronary artery disease)   . CAD (coronary artery disease) of bypass graft   . Vertigo   . History of transient ischemic attack (TIA)   . Myocardial infarction     AGE 11    . Hypertension   . Depression   . COPD 12/14/2008    PATIENT DENIES    . Personal history of malignant neoplasm of kidney(V10.52) 12/14/2008     Laparoscopic biopsy and cryoablation 7/08 Dr. Vernie Shanks   . Headache(784.0)   . Neuromuscular disorder     PERIPHERAL NEUROPATHY  . Anemia   . Diabetic coma Feb. 2014  . Fall at home Jan. 2, 2016  Jan. 13, 2016    Left knee  . Anginal pain     1 MONTH AGO   . Stroke     3 MINI STROKES  RT SIDED WEAKNESS  . 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 Fibromyalgia   . HPV (human papilloma virus) infection     Past Surgical History  Procedure Laterality Date  . Appendectomy    . Abdominal hysterectomy    . Knee surgery    . Cholecystectomy    . Lung surgery      lung nodule removed from the right side  . Laparascopic cryoablation of left kidney  08/2006    Dr. Gladis Riffle for renal cell cancer  . Bladder suspension    . Shoulder surgery    . Foot surgery    . Pr vein bypass graft,aorto-fem-pop  05/27/2010  . Cardiac catheterization    . Femoral-popliteal bypass graft  10/12/2011    Procedure: BYPASS GRAFT FEMORAL-POPLITEAL ARTERY;  Surgeon: Elam Dutch, MD;  Location: [facility];  Service: Vascular;  Laterality:  Left;  Left Femoral-Popliteal Bypass Graft using 36mm x 80cm Propaten Graft with intraop arteriogram times one.  . Intraoperative arteriogram  10/12/2011    Procedure: INTRA OPERATIVE ARTERIOGRAM;  Surgeon: Elam Dutch, MD;  Location: [facility];  Service: Vascular;  Laterality: Left;  . Left heart catheterization with coronary angiogram N/A 05/11/2011    Procedure: LEFT HEART CATHETERIZATION WITH CORONARY ANGIOGRAM;  Surgeon: Jolaine Artist, MD;  Location: [facility] CATH LAB;  Service: Cardiovascular;  Laterality: N/A;  . Abdominal aortagram N/A 08/21/2011    Procedure: ABDOMINAL Maxcine Ham;  Surgeon: Elam Dutch, MD;  Location: [facility] CATH LAB;  Service: Cardiovascular;  Laterality: N/A;  . Abdominal aortagram N/A 03/16/2014    Procedure: ABDOMINAL Maxcine Ham;  Surgeon: Elam Dutch, MD;  Location: [facility] CATH LAB;  Service: Cardiovascular;  Laterality: N/A;      Current Outpatient Prescriptions  Medication Sig Dispense Refill  . ACCU-CHEK AVIVA PLUS test strip     . Alcohol Swabs (B-D SINGLE USE SWABS REGULAR) PADS     . amitriptyline (ELAVIL) 75 MG tablet Take 75 mg by mouth at bedtime.    Marland Kitchen amLODipine (NORVASC) 10 MG tablet Take 10 mg by mouth daily.     Marland Kitchen aspirin 81 MG tablet Take 81 mg by mouth daily.    . cloNIDine (CATAPRES) 0.3 MG tablet Take 0.3 mg by mouth daily.     . clopidogrel (PLAVIX) 75 MG tablet Take 75 mg by mouth daily.     . cyclobenzaprine (FLEXERIL) 10 MG tablet Take 10 mg by mouth at bedtime as needed for muscle spasms.     . diphenhydrAMINE (BENADRYL) 25 MG tablet Take 25 mg by mouth every 6 (six) hours as needed for itching or allergies.    . DULoxetine (CYMBALTA) 30 MG capsule Take 30 mg by mouth at bedtime.    . hydrALAZINE (APRESOLINE) 25 MG tablet Take 25 mg by mouth 2 (two) times daily.     . hydrochlorothiazide (HYDRODIURIL) 25 MG tablet Take 25 mg by mouth daily.     Marland Kitchen HYDROcodone-acetaminophen (NORCO) 10-325 MG per tablet Take 1 tablet by mouth every 4 (four) hours as needed (pain).     . insulin aspart (NOVOLOG) 100 UNIT/ML injection Inject 5-10 Units into the skin 3 (three) times daily as needed for high blood sugar (cbg over 200). Per sliding scale    . insulin glargine (LANTUS) 100 UNIT/ML injection Inject 80 Units into the skin at bedtime. Dispense syringes and needles in needed, dispense Lantus pen if approved by insurance.    . lansoprazole (PREVACID) 30 MG capsule Take 30 mg by mouth daily.     Marland Kitchen levothyroxine (SYNTHROID, LEVOTHROID) 125 MCG tablet Take 125 mcg by mouth daily before breakfast.    . lisinopril (PRINIVIL,ZESTRIL) 5 MG tablet Take 5 mg by mouth daily.    Marland Kitchen LORazepam (ATIVAN) 1 MG tablet Take 3 mg by mouth at bedtime.    . methocarbamol (ROBAXIN) 500 MG tablet Take 1 tablet (500 mg total) by mouth 2 (two) times daily as needed for muscle spasms. 20 tablet 0  . metoprolol succinate (TOPROL-XL)  25 MG 24 hr tablet Take 25 mg by mouth daily.    . nortriptyline (PAMELOR) 75 MG capsule Take 75 mg by mouth at bedtime.    . pravastatin (PRAVACHOL) 40 MG tablet Take 40 mg by mouth daily.    . promethazine (PHENERGAN) 25 MG suppository Place 1 suppository (25 mg total) rectally every 6 (six) hours as needed  for nausea or vomiting. 2 suppository 0  . tolterodine (DETROL LA) 4 MG 24 hr capsule Take 4 mg by mouth daily.     . [DISCONTINUED] Gabapentin (NEURONTIN PO) Take by mouth. New medicine, pt doesn't remember dosage     No current facility-administered medications for this visit.    Allergies:   Review of patient's allergies indicates no known allergies.    Social History:  The patient  reports that she has been smoking Cigarettes.  She has a 12.5 pack-year smoking history. She has never used smokeless tobacco. She reports that she does not drink alcohol or use illicit drugs.   Family History:  The patient's family history includes Heart disease in her father, mother, and sister; Lung cancer in her father and mother.    ROS:  Please see the history of present illness.   Otherwise, review of systems are positive for none.   All other systems are reviewed and negative.    PHYSICAL EXAM: VS:  BP 110/60 mmHg  Pulse 80  Ht 5\' 1"  (1.549 m)  Wt 74.753 kg (164 lb 12.8 oz)  BMI 31.15 kg/m2 , BMI Body mass index is 31.15 kg/(m^2). Chronically ill white female  HEENT: normal Neck: no JVD, carotid bruits, or masses Cardiac:  RRR; no murmurs, rubs, or gallops,no edema  Respiratory:  clear to auscultation bilaterally, normal work of breathing GI: soft, nontender, nondistended, + BS MS: no deformity or atrophy Skin: warm and dry, no rash Neuro:  Strength and sensation are intact Psych: euthymic mood, full affect Decreased PT/DP LLE  Decrease LUE radial pulse    EKG:   SR rate 81 nonspecific ST/T wave changes    Recent Labs: 04/10/2014: ALT 13; BUN 29*; Creatinine 1.30*; Hemoglobin  12.5; Platelets 316; Potassium 4.2; Sodium 137    Lipid Panel    Component Value Date/Time   CHOL 144 02/23/2008 2111   TRIG 198* 06/05/2010 2018   HDL 38* 02/23/2008 2111   CHOLHDL 3.8 Ratio 02/23/2008 2111   VLDL 30 02/23/2008 2111   [place] 76 02/23/2008 2111      Wt Readings from Last 3 Encounters:  04/12/14 74.753 kg (164 lb 12.8 oz)  03/16/14 67.132 kg (148 lb)  03/07/14 73.483 kg (162 lb)      Other studies Reviewed: Additional studies/ records that were reviewed today include: Epic records Dr Oneida Alar and LE angios.    ASSESSMENT AND PLAN:  1.  Preop:  Significant vascular diseae.  Last cath 2013 no obstructive disease but taking nitro for chest pain  Needs lexiscan myovue to clear for redo vascular surgery Will try to schedule for tomorrow since surgery scheduled for Tuesday 2. Carotid Disease  Moderate stenosis no TIA duplex in 6 months 3. HTN:  At goal continue ACE 4. Chol:  Continue statin  5. PVD  Per Dr Oneida Alar  Increasing LLE claudication redo fem pop once cardiac cleared    Current medicines are reviewed at length with the patient today.  The patient does not have concerns regarding medicines.  The following changes have been made:  no change  Labs/ tests ordered today include:  Lexiscan Myovue   Orders Placed This Encounter  Procedures  . Myocardial Perfusion Imaging     Disposition:   FU with me 6 months     Signed, [name], MD  04/12/2014 9:45 AM    [facility] [address], [place], [place]  [address] Phone: [phone]; Fax: [phone]

## 2014-04-12 ENCOUNTER — Encounter: Payer: Self-pay | Admitting: Cardiovascular Disease

## 2014-04-12 ENCOUNTER — Ambulatory Visit (INDEPENDENT_AMBULATORY_CARE_PROVIDER_SITE_OTHER): Payer: Medicare HMO | Admitting: Cardiovascular Disease

## 2014-04-12 VITALS — BP 110/60 | HR 80 | Ht 61.0 in | Wt 164.8 lb

## 2014-04-12 DIAGNOSIS — Z01818 Encounter for other preprocedural examination: Secondary | ICD-10-CM

## 2014-04-12 NOTE — Patient Instructions (Signed)
Your physician wants you to follow-up in:   6 MONTHS WITH DR NISHAN' You will receive a reminder letter in the mail two months in advance. If you don't receive a letter, please call our office to schedule the follow-up appointment. Your physician recommends that you continue on your current medications as directed. Please refer to the Current Medication list given to you today.   Your physician has requested that you have a lexiscan myoview. For further information please visit [email or web address]. Please follow instruction sheet, as given.  

## 2014-04-13 ENCOUNTER — Encounter (HOSPITAL_COMMUNITY): Payer: Self-pay | Admitting: Radiology

## 2014-04-13 ENCOUNTER — Ambulatory Visit (HOSPITAL_COMMUNITY): Payer: Medicare HMO | Attending: Cardiovascular Disease | Admitting: Radiology

## 2014-04-13 DIAGNOSIS — Z794 Long term (current) use of insulin: Secondary | ICD-10-CM | POA: Insufficient documentation

## 2014-04-13 DIAGNOSIS — E119 Type 2 diabetes mellitus without complications: Secondary | ICD-10-CM | POA: Insufficient documentation

## 2014-04-13 DIAGNOSIS — I10 Essential (primary) hypertension: Secondary | ICD-10-CM | POA: Insufficient documentation

## 2014-04-13 DIAGNOSIS — Z01818 Encounter for other preprocedural examination: Secondary | ICD-10-CM | POA: Insufficient documentation

## 2014-04-13 DIAGNOSIS — R079 Chest pain, unspecified: Secondary | ICD-10-CM | POA: Diagnosis present

## 2014-04-13 MED ORDER — TECHNETIUM TC 99M SESTAMIBI GENERIC - CARDIOLITE
33.0000 | Freq: Once | INTRAVENOUS | Status: AC | PRN
  Administered 2014-04-13: 33 via INTRAVENOUS

## 2014-04-13 MED ORDER — REGADENOSON 0.4 MG/5ML IV SOLN
0.4000 mg | Freq: Once | INTRAVENOUS | Status: AC
  Administered 2014-04-13: 0.4 mg via INTRAVENOUS

## 2014-04-13 MED ORDER — TECHNETIUM TC 99M SESTAMIBI GENERIC - CARDIOLITE
11.0000 | Freq: Once | INTRAVENOUS | Status: AC | PRN
  Administered 2014-04-13: 11 via INTRAVENOUS

## 2014-04-13 NOTE — Progress Notes (Signed)
[facility] [address] [place], [place] [address] [phone]    Cardiology Nuclear Med Study  April Branch is a 69 y.o. female     MRN : [id number]     DOB: 02-Jan-1946  Procedure Date: 04/13/2014  Nuclear Med Background Indication for Stress Test:  Evaluation for Ischemia and Surgical Clearance for vascular surgery ( Dr. Ruta Hinds) History:  CAD,PVD Cardiac Risk Factors: Carotid Disease, Hypertension and IDDM Type 2  Symptoms:  Chest Pain   Nuclear Pre-Procedure Caffeine/Decaff Intake:  None NPO After: 7:00pm   Lungs:  clear O2 Sat: 86% on room air. IV 0.9% NS with Angio Cath:  22g  IV Site: L Antecubital  IV Started by:  Crissie Figures, RN  Chest Size (in):  38 Cup Size: D  Height: 5\' 1"  (1.549 m)  Weight:  164 lb (74.39 kg)  BMI:  Body mass index is 31 kg/(m^2). Tech Comments:  N/A    Nuclear Med Study 1 or 2 day study: 1 day  Stress Test Type:  Lexiscan  Reading MD: N/A  Order Authorizing Provider:  [name], MD  Resting Radionuclide: Technetium 74m Sestamibi  Resting Radionuclide Dose: 11.0 mCi   Stress Radionuclide:  Technetium 29m Sestamibi  Stress Radionuclide Dose: 33.0 mCi           Stress Protocol Rest HR: 86 Stress HR: 98  Rest BP: 126/65 Stress BP: 127/62  Exercise Time (min): n/a METS: n/a   Predicted Max HR: 154 bpm % Max HR: 63.64 bpm Rate Pressure Product: 12446   Dose of Adenosine (mg):  n/a Dose of Lexiscan: 0.4 mg  Dose of Atropine (mg): n/a Dose of Dobutamine: n/a mcg/kg/min (at max HR)  Stress Test Technologist: Crissie Figures, RN  Nuclear Technologist:  Earl Many, CNMT     Rest Procedure:  Myocardial perfusion imaging was performed at rest 45 minutes following the intravenous administration of Technetium 68m Sestamibi. Rest ECG: Normal sinus rhythm. Normal EKG  Stress Procedure:  The patient received IV Lexiscan 0.4 mg over 15-seconds.  Technetium 63m Sestamibi injected at 30-seconds.   Quantitative spect images were obtained after a 45 minute delay. Stress ECG: No significant change from baseline ECG  QPS Raw Data Images:  Normal; no motion artifact; normal heart/lung ratio. Stress Images:  Normal homogeneous uptake in all areas of the myocardium. Rest Images:  Normal homogeneous uptake in all areas of the myocardium. Subtraction (SDS):  No evidence of ischemia. Transient Ischemic Dilatation (Normal <1.22):  1.07 Lung/Heart Ratio (Normal <0.45):  0.45  Quantitative Gated Spect Images QGS EDV:  49 ml QGS ESV:  11 ml  Impression Exercise Capacity:  Lexiscan with no exercise. BP Response:  Normal blood pressure response. Clinical Symptoms:  Dyspnea ECG Impression:  No significant ST segment change suggestive of ischemia. Comparison with Prior Nuclear Study: No images to compare  Overall Impression:  Normal stress nuclear study. There is no scar or ischemia. This is a low risk scan.  LV Ejection Fraction: 78%.  LV Wall Motion:  Normal Wall Motion   Daryel November, MD

## 2014-04-13 NOTE — Progress Notes (Signed)
Pt. Has been seen by Dr. Johnsie Cancel, will have stress test today.

## 2014-04-16 MED ORDER — CHLORHEXIDINE GLUCONATE CLOTH 2 % EX PADS: 6.0000 | MEDICATED_PAD | Freq: Once | CUTANEOUS | Status: DC

## 2014-04-16 MED ORDER — SODIUM CHLORIDE 0.9 % IV SOLN
INTRAVENOUS | Status: DC
  Administered 2014-04-17 (×2): via INTRAVENOUS

## 2014-04-16 MED ORDER — DEXTROSE 5 % IV SOLN
1.5000 g | INTRAVENOUS | Status: AC
  Administered 2014-04-17: 1.5 g via INTRAVENOUS
  Filled 2014-04-16: qty 1.5

## 2014-04-17 ENCOUNTER — Encounter (HOSPITAL_COMMUNITY)
Admission: RE | Disposition: A | Discharge status: Home Health | Payer: Self-pay | Source: Ambulatory Visit | Attending: Vascular Surgery

## 2014-04-17 ENCOUNTER — Inpatient Hospital Stay (HOSPITAL_COMMUNITY): Payer: Medicare HMO | Admitting: Anesthesiology

## 2014-04-17 ENCOUNTER — Encounter (HOSPITAL_COMMUNITY): Payer: Self-pay | Admitting: *Deleted

## 2014-04-17 ENCOUNTER — Inpatient Hospital Stay (HOSPITAL_COMMUNITY)
Admission: RE | Admit: 2014-04-17 | Discharge: 2014-04-20 | DRG: 253 | Disposition: A | Discharge status: Home Health | Payer: Medicare HMO | Source: Ambulatory Visit | Attending: Vascular Surgery | Admitting: Vascular Surgery

## 2014-04-17 DIAGNOSIS — Z8673 Personal history of transient ischemic attack (TIA), and cerebral infarction without residual deficits: Secondary | ICD-10-CM

## 2014-04-17 DIAGNOSIS — I70212 Atherosclerosis of native arteries of extremities with intermittent claudication, left leg: Secondary | ICD-10-CM

## 2014-04-17 DIAGNOSIS — Z85528 Personal history of other malignant neoplasm of kidney: Secondary | ICD-10-CM

## 2014-04-17 DIAGNOSIS — K219 Gastro-esophageal reflux disease without esophagitis: Secondary | ICD-10-CM | POA: Diagnosis present

## 2014-04-17 DIAGNOSIS — M5410 Radiculopathy, site unspecified: Secondary | ICD-10-CM | POA: Diagnosis present

## 2014-04-17 DIAGNOSIS — Z794 Long term (current) use of insulin: Secondary | ICD-10-CM

## 2014-04-17 DIAGNOSIS — E785 Hyperlipidemia, unspecified: Secondary | ICD-10-CM | POA: Diagnosis present

## 2014-04-17 DIAGNOSIS — E039 Hypothyroidism, unspecified: Secondary | ICD-10-CM | POA: Diagnosis present

## 2014-04-17 DIAGNOSIS — W19XXXA Unspecified fall, initial encounter: Secondary | ICD-10-CM | POA: Diagnosis present

## 2014-04-17 DIAGNOSIS — I70312 Atherosclerosis of unspecified type of bypass graft(s) of the extremities with intermittent claudication, left leg: Secondary | ICD-10-CM | POA: Diagnosis present

## 2014-04-17 DIAGNOSIS — D62 Acute posthemorrhagic anemia: Secondary | ICD-10-CM | POA: Diagnosis not present

## 2014-04-17 DIAGNOSIS — M79605 Pain in left leg: Secondary | ICD-10-CM | POA: Diagnosis present

## 2014-04-17 DIAGNOSIS — F17210 Nicotine dependence, cigarettes, uncomplicated: Secondary | ICD-10-CM | POA: Diagnosis present

## 2014-04-17 DIAGNOSIS — I2510 Atherosclerotic heart disease of native coronary artery without angina pectoris: Secondary | ICD-10-CM | POA: Diagnosis present

## 2014-04-17 DIAGNOSIS — I6529 Occlusion and stenosis of unspecified carotid artery: Secondary | ICD-10-CM | POA: Diagnosis present

## 2014-04-17 DIAGNOSIS — E1140 Type 2 diabetes mellitus with diabetic neuropathy, unspecified: Secondary | ICD-10-CM | POA: Diagnosis present

## 2014-04-17 HISTORY — PX: FEMORAL-POPLITEAL BYPASS GRAFT: SHX937

## 2014-04-17 LAB — GLUCOSE, CAPILLARY
GLUCOSE-CAPILLARY: 243 mg/dL — AB (ref 70–99)
GLUCOSE-CAPILLARY: 210 mg/dL — AB (ref 70–99)
Glucose-Capillary: 293 mg/dL — ABNORMAL HIGH (ref 70–99)
Glucose-Capillary: 225 mg/dL — ABNORMAL HIGH (ref 70–99)
Glucose-Capillary: 173 mg/dL — ABNORMAL HIGH (ref 70–99)
Glucose-Capillary: 222 mg/dL — ABNORMAL HIGH (ref 70–99)

## 2014-04-17 LAB — CREATININE, SERUM
Creatinine, Ser: 0.98 mg/dL (ref 0.50–1.10)
GFR calc Af Amer: 68 mL/min — ABNORMAL LOW (ref >90)
GFR, EST NON AFRICAN AMERICAN: 59 mL/min — AB (ref >90)

## 2014-04-17 LAB — CBC
HEMATOCRIT: 29.8 % — AB (ref 36.0–46.0)
Hemoglobin: 9.9 g/dL — ABNORMAL LOW (ref 12.0–15.0)
MCH: 28.3 pg (ref 26.0–34.0)
MCHC: 33.2 g/dL (ref 30.0–36.0)
MCV: 85.1 fL (ref 78.0–100.0)
Platelets: 244 10*3/uL (ref 150–400)
RBC: 3.5 MIL/uL — ABNORMAL LOW (ref 3.87–5.11)
RDW: 13.7 % (ref 11.5–15.5)
WBC: 12.1 10*3/uL — AB (ref 4.0–10.5)

## 2014-04-17 SURGERY — BYPASS GRAFT FEMORAL-POPLITEAL ARTERY
Anesthesia: General | Site: Groin | Laterality: Left

## 2014-04-17 MED ORDER — ACETAMINOPHEN 325 MG PO TABS
325.0000 mg | ORAL_TABLET | ORAL | Status: DC | PRN
Start: 2014-04-17 — End: 2014-04-20
  Administered 2014-04-17: 650 mg via ORAL
  Filled 2014-04-17: qty 2

## 2014-04-17 MED ORDER — NEOSTIGMINE METHYLSULFATE 10 MG/10ML IV SOLN
INTRAVENOUS | Status: AC
  Filled 2014-04-17: qty 1

## 2014-04-17 MED ORDER — HYDROCODONE-ACETAMINOPHEN 10-325 MG PO TABS
1.0000 | ORAL_TABLET | ORAL | Status: DC | PRN
  Administered 2014-04-17 – 2014-04-20 (×11): 1 via ORAL
  Filled 2014-04-17 (×12): qty 1

## 2014-04-17 MED ORDER — MORPHINE SULFATE 2 MG/ML IJ SOLN
2.0000 mg | INTRAMUSCULAR | Status: DC | PRN
  Administered 2014-04-17: 4 mg via INTRAVENOUS
  Administered 2014-04-17 (×2): 2 mg via INTRAVENOUS
  Filled 2014-04-17: qty 2
  Filled 2014-04-17: qty 1
  Filled 2014-04-17: qty 2

## 2014-04-17 MED ORDER — HYDRALAZINE HCL 20 MG/ML IJ SOLN: 5.0000 mg | INTRAMUSCULAR | Status: DC | PRN

## 2014-04-17 MED ORDER — ASPIRIN 81 MG PO CHEW
81.0000 mg | CHEWABLE_TABLET | Freq: Every day | ORAL | Status: DC
  Administered 2014-04-17 – 2014-04-20 (×4): 81 mg via ORAL
  Filled 2014-04-17 (×4): qty 1

## 2014-04-17 MED ORDER — NEOSTIGMINE METHYLSULFATE 10 MG/10ML IV SOLN
INTRAVENOUS | Status: DC | PRN
  Administered 2014-04-17: 3 mg via INTRAVENOUS

## 2014-04-17 MED ORDER — SODIUM CHLORIDE 0.9 % IJ SOLN
INTRAMUSCULAR | Status: AC
  Filled 2014-04-17: qty 10

## 2014-04-17 MED ORDER — INSULIN ASPART 100 UNIT/ML ~~LOC~~ SOLN: 5.0000 [IU] | Freq: Three times a day (TID) | SUBCUTANEOUS | Status: DC | PRN

## 2014-04-17 MED ORDER — NORTRIPTYLINE HCL 25 MG PO CAPS
75.0000 mg | ORAL_CAPSULE | Freq: Every day | ORAL | Status: DC
  Administered 2014-04-17 – 2014-04-19 (×3): 75 mg via ORAL
  Filled 2014-04-17 (×6): qty 3

## 2014-04-17 MED ORDER — THROMBIN 20000 UNITS EX SOLR
CUTANEOUS | Status: DC | PRN
  Administered 2014-04-17: 20 mL via TOPICAL

## 2014-04-17 MED ORDER — ASPIRIN 81 MG PO TABS: 81.0000 mg | ORAL_TABLET | Freq: Every day | ORAL | Status: DC

## 2014-04-17 MED ORDER — ALBUTEROL SULFATE HFA 108 (90 BASE) MCG/ACT IN AERS
INHALATION_SPRAY | RESPIRATORY_TRACT | Status: DC | PRN
  Administered 2014-04-17: 2 via RESPIRATORY_TRACT

## 2014-04-17 MED ORDER — EPHEDRINE SULFATE 50 MG/ML IJ SOLN
INTRAMUSCULAR | Status: DC | PRN
  Administered 2014-04-17 (×3): 10 mg via INTRAVENOUS
  Administered 2014-04-17: 20 mg via INTRAVENOUS

## 2014-04-17 MED ORDER — SODIUM CHLORIDE 0.9 % IV SOLN: 500.0000 mL | Freq: Once | INTRAVENOUS | Status: AC | PRN

## 2014-04-17 MED ORDER — MIDAZOLAM HCL 2 MG/2ML IJ SOLN: 0.5000 mg | Freq: Once | INTRAMUSCULAR | Status: DC | PRN

## 2014-04-17 MED ORDER — FENTANYL CITRATE 0.05 MG/ML IJ SOLN
INTRAMUSCULAR | Status: AC
  Administered 2014-04-17: 50 ug via INTRAVENOUS
  Filled 2014-04-17: qty 2

## 2014-04-17 MED ORDER — INSULIN ASPART 100 UNIT/ML ~~LOC~~ SOLN
0.0000 [IU] | SUBCUTANEOUS | Status: DC
  Administered 2014-04-17 (×2): 3 [IU] via SUBCUTANEOUS
  Administered 2014-04-17 – 2014-04-18 (×2): 2 [IU] via SUBCUTANEOUS

## 2014-04-17 MED ORDER — ALUM & MAG HYDROXIDE-SIMETH 200-200-20 MG/5ML PO SUSP: 15.0000 mL | ORAL | Status: DC | PRN

## 2014-04-17 MED ORDER — MAGNESIUM SULFATE 2 GM/50ML IV SOLN
2.0000 g | Freq: Every day | INTRAVENOUS | Status: DC | PRN
  Filled 2014-04-17: qty 50

## 2014-04-17 MED ORDER — PHENYLEPHRINE HCL 10 MG/ML IJ SOLN
INTRAMUSCULAR | Status: DC | PRN
  Administered 2014-04-17: 40 ug via INTRAVENOUS
  Administered 2014-04-17 (×3): 80 ug via INTRAVENOUS
  Administered 2014-04-17: 40 ug via INTRAVENOUS
  Administered 2014-04-17 (×2): 80 ug via INTRAVENOUS
  Administered 2014-04-17 (×2): 40 ug via INTRAVENOUS

## 2014-04-17 MED ORDER — PANTOPRAZOLE SODIUM 40 MG PO TBEC
40.0000 mg | DELAYED_RELEASE_TABLET | Freq: Every day | ORAL | Status: DC
  Administered 2014-04-17 – 2014-04-20 (×4): 40 mg via ORAL
  Filled 2014-04-17 (×4): qty 1

## 2014-04-17 MED ORDER — ENOXAPARIN SODIUM 30 MG/0.3ML ~~LOC~~ SOLN
30.0000 mg | SUBCUTANEOUS | Status: DC
  Administered 2014-04-18 – 2014-04-19 (×2): 30 mg via SUBCUTANEOUS
  Filled 2014-04-17 (×3): qty 0.3

## 2014-04-17 MED ORDER — LIDOCAINE HCL (CARDIAC) 20 MG/ML IV SOLN
INTRAVENOUS | Status: DC | PRN
  Administered 2014-04-17: 100 mg via INTRAVENOUS

## 2014-04-17 MED ORDER — ROCURONIUM BROMIDE 50 MG/5ML IV SOLN
INTRAVENOUS | Status: AC
  Filled 2014-04-17: qty 1

## 2014-04-17 MED ORDER — ONDANSETRON HCL 4 MG/2ML IJ SOLN
INTRAMUSCULAR | Status: AC
  Filled 2014-04-17: qty 2

## 2014-04-17 MED ORDER — DULOXETINE HCL 30 MG PO CPEP
30.0000 mg | ORAL_CAPSULE | Freq: Every day | ORAL | Status: DC
  Administered 2014-04-17 – 2014-04-19 (×3): 30 mg via ORAL
  Filled 2014-04-17 (×5): qty 1

## 2014-04-17 MED ORDER — GUAIFENESIN-DM 100-10 MG/5ML PO SYRP
15.0000 mL | ORAL_SOLUTION | ORAL | Status: DC | PRN
Start: 2014-04-17 — End: 2014-04-20

## 2014-04-17 MED ORDER — INSULIN GLARGINE 100 UNIT/ML ~~LOC~~ SOLN
80.0000 [IU] | Freq: Every day | SUBCUTANEOUS | Status: DC
  Administered 2014-04-17 – 2014-04-19 (×3): 80 [IU] via SUBCUTANEOUS
  Filled 2014-04-17 (×4): qty 0.8

## 2014-04-17 MED ORDER — ROCURONIUM BROMIDE 100 MG/10ML IV SOLN
INTRAVENOUS | Status: DC | PRN
  Administered 2014-04-17: 20 mg via INTRAVENOUS
  Administered 2014-04-17: 50 mg via INTRAVENOUS
  Administered 2014-04-17: 20 mg via INTRAVENOUS

## 2014-04-17 MED ORDER — GLYCOPYRROLATE 0.2 MG/ML IJ SOLN
INTRAMUSCULAR | Status: DC | PRN
  Administered 2014-04-17: .4 mg via INTRAVENOUS

## 2014-04-17 MED ORDER — SODIUM CHLORIDE 0.9 % IV SOLN
INTRAVENOUS | Status: DC
  Administered 2014-04-17: 50 mL/h via INTRAVENOUS

## 2014-04-17 MED ORDER — CYCLOBENZAPRINE HCL 10 MG PO TABS
10.0000 mg | ORAL_TABLET | Freq: Every evening | ORAL | Status: DC | PRN
  Administered 2014-04-19: 10 mg via ORAL
  Filled 2014-04-17: qty 1

## 2014-04-17 MED ORDER — METOPROLOL SUCCINATE ER 25 MG PO TB24
25.0000 mg | ORAL_TABLET | ORAL | Status: AC
  Administered 2014-04-17: 25 mg via ORAL
  Filled 2014-04-17: qty 1

## 2014-04-17 MED ORDER — CLOPIDOGREL BISULFATE 75 MG PO TABS
75.0000 mg | ORAL_TABLET | Freq: Every day | ORAL | Status: DC
  Administered 2014-04-18 – 2014-04-20 (×3): 75 mg via ORAL
  Filled 2014-04-17 (×3): qty 1

## 2014-04-17 MED ORDER — ONDANSETRON HCL 4 MG/2ML IJ SOLN
INTRAMUSCULAR | Status: DC | PRN
  Administered 2014-04-17: 4 mg via INTRAVENOUS

## 2014-04-17 MED ORDER — METHOCARBAMOL 500 MG PO TABS
500.0000 mg | ORAL_TABLET | Freq: Two times a day (BID) | ORAL | Status: DC | PRN
  Administered 2014-04-17 – 2014-04-18 (×2): 500 mg via ORAL
  Filled 2014-04-17 (×3): qty 1

## 2014-04-17 MED ORDER — PROPOFOL 10 MG/ML IV BOLUS
INTRAVENOUS | Status: AC
  Filled 2014-04-17: qty 20

## 2014-04-17 MED ORDER — LABETALOL HCL 5 MG/ML IV SOLN
10.0000 mg | INTRAVENOUS | Status: DC | PRN
  Filled 2014-04-17: qty 4

## 2014-04-17 MED ORDER — THROMBIN 20000 UNITS EX SOLR
CUTANEOUS | Status: AC
  Filled 2014-04-17: qty 20000

## 2014-04-17 MED ORDER — LORAZEPAM 1 MG PO TABS
3.0000 mg | ORAL_TABLET | Freq: Every day | ORAL | Status: DC
  Administered 2014-04-17 – 2014-04-19 (×3): 3 mg via ORAL
  Filled 2014-04-17 (×3): qty 3

## 2014-04-17 MED ORDER — METOPROLOL TARTRATE 1 MG/ML IV SOLN: 2.0000 mg | INTRAVENOUS | Status: DC | PRN

## 2014-04-17 MED ORDER — PHENYLEPHRINE 40 MCG/ML (10ML) SYRINGE FOR IV PUSH (FOR BLOOD PRESSURE SUPPORT)
PREFILLED_SYRINGE | INTRAVENOUS | Status: AC
  Filled 2014-04-17: qty 10

## 2014-04-17 MED ORDER — PROMETHAZINE HCL 25 MG/ML IJ SOLN: 6.2500 mg | INTRAMUSCULAR | Status: DC | PRN

## 2014-04-17 MED ORDER — LIDOCAINE HCL (CARDIAC) 20 MG/ML IV SOLN
INTRAVENOUS | Status: AC
  Filled 2014-04-17: qty 5

## 2014-04-17 MED ORDER — BISACODYL 10 MG RE SUPP: 10.0000 mg | Freq: Every day | RECTAL | Status: DC | PRN

## 2014-04-17 MED ORDER — FENTANYL CITRATE 0.05 MG/ML IJ SOLN
INTRAMUSCULAR | Status: AC
  Filled 2014-04-17: qty 5

## 2014-04-17 MED ORDER — SODIUM CHLORIDE 0.9 % IR SOLN
Status: DC | PRN
  Administered 2014-04-17: 500 mL

## 2014-04-17 MED ORDER — MIDAZOLAM HCL 5 MG/5ML IJ SOLN
INTRAMUSCULAR | Status: DC | PRN
  Administered 2014-04-17: 2 mg via INTRAVENOUS

## 2014-04-17 MED ORDER — LISINOPRIL 5 MG PO TABS
5.0000 mg | ORAL_TABLET | Freq: Every day | ORAL | Status: DC
  Administered 2014-04-19: 5 mg via ORAL
  Filled 2014-04-17 (×4): qty 1

## 2014-04-17 MED ORDER — ONDANSETRON HCL 4 MG/2ML IJ SOLN
4.0000 mg | Freq: Four times a day (QID) | INTRAMUSCULAR | Status: DC | PRN
  Administered 2014-04-18: 4 mg via INTRAVENOUS
  Filled 2014-04-17: qty 2

## 2014-04-17 MED ORDER — FENTANYL CITRATE 0.05 MG/ML IJ SOLN
INTRAMUSCULAR | Status: AC
  Administered 2014-04-17: 25 ug via INTRAVENOUS
  Filled 2014-04-17: qty 2

## 2014-04-17 MED ORDER — POTASSIUM CHLORIDE CRYS ER 20 MEQ PO TBCR: 20.0000 meq | EXTENDED_RELEASE_TABLET | Freq: Every day | ORAL | Status: DC | PRN

## 2014-04-17 MED ORDER — AMITRIPTYLINE HCL 75 MG PO TABS
75.0000 mg | ORAL_TABLET | Freq: Every day | ORAL | Status: DC
  Administered 2014-04-17 – 2014-04-19 (×3): 75 mg via ORAL
  Filled 2014-04-17 (×5): qty 1

## 2014-04-17 MED ORDER — ALBUMIN HUMAN 5 % IV SOLN
INTRAVENOUS | Status: DC | PRN
  Administered 2014-04-17: 11:00:00 via INTRAVENOUS

## 2014-04-17 MED ORDER — DOCUSATE SODIUM 100 MG PO CAPS
100.0000 mg | ORAL_CAPSULE | Freq: Every day | ORAL | Status: DC
Start: 2014-04-18 — End: 2014-04-20
  Administered 2014-04-18 – 2014-04-20 (×3): 100 mg via ORAL
  Filled 2014-04-17 (×3): qty 1

## 2014-04-17 MED ORDER — GLYCOPYRROLATE 0.2 MG/ML IJ SOLN
INTRAMUSCULAR | Status: AC
  Filled 2014-04-17: qty 3

## 2014-04-17 MED ORDER — MEPERIDINE HCL 25 MG/ML IJ SOLN: 6.2500 mg | INTRAMUSCULAR | Status: DC | PRN

## 2014-04-17 MED ORDER — PROTAMINE SULFATE 10 MG/ML IV SOLN
INTRAVENOUS | Status: AC
  Filled 2014-04-17: qty 5

## 2014-04-17 MED ORDER — HYDRALAZINE HCL 25 MG PO TABS
25.0000 mg | ORAL_TABLET | Freq: Two times a day (BID) | ORAL | Status: DC
  Administered 2014-04-18 – 2014-04-19 (×2): 25 mg via ORAL
  Filled 2014-04-17 (×10): qty 1

## 2014-04-17 MED ORDER — ARTIFICIAL TEARS OP OINT
TOPICAL_OINTMENT | OPHTHALMIC | Status: AC
  Filled 2014-04-17: qty 3.5

## 2014-04-17 MED ORDER — ACETAMINOPHEN 325 MG RE SUPP
325.0000 mg | RECTAL | Status: DC | PRN
  Filled 2014-04-17: qty 2

## 2014-04-17 MED ORDER — PHENOL 1.4 % MT LIQD: 1.0000 | OROMUCOSAL | Status: DC | PRN

## 2014-04-17 MED ORDER — PROPOFOL 10 MG/ML IV BOLUS
INTRAVENOUS | Status: DC | PRN
  Administered 2014-04-17: 100 mg via INTRAVENOUS

## 2014-04-17 MED ORDER — AMLODIPINE BESYLATE 10 MG PO TABS
10.0000 mg | ORAL_TABLET | Freq: Every day | ORAL | Status: DC
  Administered 2014-04-19: 10 mg via ORAL
  Filled 2014-04-17 (×4): qty 1

## 2014-04-17 MED ORDER — CLONIDINE HCL 0.3 MG PO TABS
0.3000 mg | ORAL_TABLET | Freq: Every day | ORAL | Status: DC
  Administered 2014-04-19: 0.3 mg via ORAL
  Filled 2014-04-17 (×4): qty 1

## 2014-04-17 MED ORDER — FENTANYL CITRATE 0.05 MG/ML IJ SOLN
25.0000 ug | INTRAMUSCULAR | Status: DC | PRN
  Administered 2014-04-17: 50 ug via INTRAVENOUS
  Administered 2014-04-17: 25 ug via INTRAVENOUS
  Administered 2014-04-17: 50 ug via INTRAVENOUS
  Administered 2014-04-17: 25 ug via INTRAVENOUS

## 2014-04-17 MED ORDER — LEVOTHYROXINE SODIUM 125 MCG PO TABS
125.0000 ug | ORAL_TABLET | Freq: Every day | ORAL | Status: DC
  Administered 2014-04-18 – 2014-04-20 (×2): 125 ug via ORAL
  Filled 2014-04-17 (×5): qty 1

## 2014-04-17 MED ORDER — EPHEDRINE SULFATE 50 MG/ML IJ SOLN
INTRAMUSCULAR | Status: AC
  Filled 2014-04-17: qty 1

## 2014-04-17 MED ORDER — HEPARIN SODIUM (PORCINE) 1000 UNIT/ML IJ SOLN
INTRAMUSCULAR | Status: AC
  Filled 2014-04-17: qty 1

## 2014-04-17 MED ORDER — LACTATED RINGERS IV SOLN
INTRAVENOUS | Status: DC | PRN
  Administered 2014-04-17: 07:00:00 via INTRAVENOUS

## 2014-04-17 MED ORDER — MIDAZOLAM HCL 2 MG/2ML IJ SOLN
INTRAMUSCULAR | Status: AC
  Filled 2014-04-17: qty 2

## 2014-04-17 MED ORDER — FENTANYL CITRATE 0.05 MG/ML IJ SOLN
INTRAMUSCULAR | Status: DC | PRN
  Administered 2014-04-17: 100 ug via INTRAVENOUS
  Administered 2014-04-17 (×3): 50 ug via INTRAVENOUS

## 2014-04-17 MED ORDER — PRAVASTATIN SODIUM 40 MG PO TABS
40.0000 mg | ORAL_TABLET | Freq: Every day | ORAL | Status: DC
  Administered 2014-04-17 – 2014-04-19 (×3): 40 mg via ORAL
  Filled 2014-04-17 (×4): qty 1

## 2014-04-17 MED ORDER — DIPHENHYDRAMINE HCL 25 MG PO TABS
25.0000 mg | ORAL_TABLET | Freq: Four times a day (QID) | ORAL | Status: DC | PRN
  Filled 2014-04-17: qty 1

## 2014-04-17 MED ORDER — HYDROCHLOROTHIAZIDE 25 MG PO TABS
25.0000 mg | ORAL_TABLET | Freq: Every day | ORAL | Status: DC
  Administered 2014-04-19: 25 mg via ORAL
  Filled 2014-04-17 (×4): qty 1

## 2014-04-17 MED ORDER — 0.9 % SODIUM CHLORIDE (POUR BTL) OPTIME
TOPICAL | Status: DC | PRN
  Administered 2014-04-17: 3000 mL

## 2014-04-17 MED ORDER — METOPROLOL SUCCINATE ER 25 MG PO TB24
25.0000 mg | ORAL_TABLET | Freq: Every day | ORAL | Status: DC
Start: 2014-04-17 — End: 2014-04-20
  Administered 2014-04-19 – 2014-04-20 (×2): 25 mg via ORAL
  Filled 2014-04-17 (×4): qty 1

## 2014-04-17 MED ORDER — MORPHINE SULFATE 2 MG/ML IJ SOLN
INTRAMUSCULAR | Status: AC
  Filled 2014-04-17: qty 1

## 2014-04-17 MED ORDER — ALBUTEROL SULFATE HFA 108 (90 BASE) MCG/ACT IN AERS
INHALATION_SPRAY | RESPIRATORY_TRACT | Status: AC
  Filled 2014-04-17: qty 6.7

## 2014-04-17 MED ORDER — FESOTERODINE FUMARATE ER 8 MG PO TB24
8.0000 mg | ORAL_TABLET | Freq: Every day | ORAL | Status: DC
  Administered 2014-04-17 – 2014-04-20 (×3): 8 mg via ORAL
  Filled 2014-04-17 (×4): qty 1

## 2014-04-17 MED ORDER — DEXTROSE 5 % IV SOLN
1.5000 g | Freq: Two times a day (BID) | INTRAVENOUS | Status: AC
  Administered 2014-04-17 – 2014-04-18 (×2): 1.5 g via INTRAVENOUS
  Filled 2014-04-17 (×2): qty 1.5

## 2014-04-17 SURGICAL SUPPLY — 57 items
BANDAGE ESMARK 6X9 LF (GAUZE/BANDAGES/DRESSINGS) IMPLANT
BNDG CMPR 9X6 STRL LF SNTH (GAUZE/BANDAGES/DRESSINGS)
BNDG ESMARK 6X9 LF (GAUZE/BANDAGES/DRESSINGS)
CANISTER SUCTION 2500CC (MISCELLANEOUS) ×2 IMPLANT
CANNULA VESSEL 3MM 2 BLNT TIP (CANNULA) ×3 IMPLANT
CLIP TI MEDIUM 24 (CLIP) ×2 IMPLANT
CLIP TI WIDE RED SMALL 24 (CLIP) ×2 IMPLANT
CUFF TOURNIQUET SINGLE 24IN (TOURNIQUET CUFF) IMPLANT
CUFF TOURNIQUET SINGLE 34IN LL (TOURNIQUET CUFF) IMPLANT
CUFF TOURNIQUET SINGLE 44IN (TOURNIQUET CUFF) IMPLANT
DRAIN SNY WOU (WOUND CARE) IMPLANT
DRAPE X-RAY CASS 24X20 (DRAPES) IMPLANT
ELECT REM PT RETURN 9FT ADLT (ELECTROSURGICAL) ×2
ELECTRODE REM PT RTRN 9FT ADLT (ELECTROSURGICAL) ×1 IMPLANT
EVACUATOR SILICONE 100CC (DRAIN) IMPLANT
GAUZE SPONGE 4X4 16PLY XRAY LF (GAUZE/BANDAGES/DRESSINGS) ×1 IMPLANT
GLOVE BIO SURGEON STRL SZ 6.5 (GLOVE) ×1 IMPLANT
GLOVE BIO SURGEON STRL SZ7.5 (GLOVE) ×2 IMPLANT
GLOVE BIOGEL PI IND STRL 6.5 (GLOVE) IMPLANT
GLOVE BIOGEL PI IND STRL 7.0 (GLOVE) IMPLANT
GLOVE BIOGEL PI INDICATOR 6.5 (GLOVE) ×5
GLOVE BIOGEL PI INDICATOR 7.0 (GLOVE) ×2
GLOVE ECLIPSE 6.5 STRL STRAW (GLOVE) ×3 IMPLANT
GOWN STRL REUS W/ TWL LRG LVL3 (GOWN DISPOSABLE) ×3 IMPLANT
GOWN STRL REUS W/ TWL XL LVL3 (GOWN DISPOSABLE) IMPLANT
GOWN STRL REUS W/TWL LRG LVL3 (GOWN DISPOSABLE) ×8
GOWN STRL REUS W/TWL XL LVL3 (GOWN DISPOSABLE) ×2
GRAFT PROPATEN THIN WALL 6X80 (Vascular Products) ×1 IMPLANT
KIT BASIN OR (CUSTOM PROCEDURE TRAY) ×2 IMPLANT
KIT ROOM TURNOVER OR (KITS) ×2 IMPLANT
LIQUID BAND (GAUZE/BANDAGES/DRESSINGS) ×2 IMPLANT
MARKER SKIN DUAL TIP RULER LAB (MISCELLANEOUS) ×1 IMPLANT
NS IRRIG 1000ML POUR BTL (IV SOLUTION) ×5 IMPLANT
PACK PERIPHERAL VASCULAR (CUSTOM PROCEDURE TRAY) ×2 IMPLANT
PAD ARMBOARD 7.5X6 YLW CONV (MISCELLANEOUS) ×4 IMPLANT
PADDING CAST COTTON 6X4 STRL (CAST SUPPLIES) IMPLANT
PROBE PENCIL 8 MHZ STRL DISP (MISCELLANEOUS) IMPLANT
SET COLLECT BLD 21X3/4 12 (NEEDLE) IMPLANT
SPONGE SURGIFOAM ABS GEL 100 (HEMOSTASIS) IMPLANT
STAPLER VISISTAT 35W (STAPLE) IMPLANT
STOPCOCK 4 WAY LG BORE MALE ST (IV SETS) IMPLANT
SUT PROLENE 5 0 C 1 24 (SUTURE) ×3 IMPLANT
SUT PROLENE 6 0 CC (SUTURE) ×5 IMPLANT
SUT PROLENE 7 0 BV 1 (SUTURE) IMPLANT
SUT PROLENE 7 0 BV1 MDA (SUTURE) IMPLANT
SUT SILK 2 0 SH (SUTURE) ×2 IMPLANT
SUT SILK 3 0 (SUTURE) ×2
SUT SILK 3-0 18XBRD TIE 12 (SUTURE) IMPLANT
SUT VIC AB 2-0 CTX 36 (SUTURE) ×4 IMPLANT
SUT VIC AB 3-0 SH 27 (SUTURE) ×4
SUT VIC AB 3-0 SH 27X BRD (SUTURE) ×2 IMPLANT
SUT VIC AB 4-0 PS2 27 (SUTURE) ×4 IMPLANT
TAPE UMBILICAL COTTON 1/8X30 (MISCELLANEOUS) ×1 IMPLANT
TRAY FOLEY CATH 16FRSI W/METER (SET/KITS/TRAYS/PACK) ×2 IMPLANT
TUBING EXTENTION W/L.L. (IV SETS) IMPLANT
UNDERPAD 30X30 INCONTINENT (UNDERPADS AND DIAPERS) ×2 IMPLANT
WATER STERILE IRR 1000ML POUR (IV SOLUTION) ×2 IMPLANT

## 2014-04-17 NOTE — H&P (Signed)
[facility] [place] HISTORY AND PHYSICAL -PAD  History of Present Illness April Branch is a 69 y.o. female patient of Dr. Oneida Alar who is status post left femoropopliteal bypass graft with propaten on 10/12/2011.   She also has a history of carotid artery stenosis.   She returns today with c/o worsening left calf claudication since she fell about a month ago on her left knee, she did not feel that she injured herself at that time and was not medically evaluated, denies non healing wounds She has severe vertigo. States she had brain imaging about 2010 which demonstrated 3 TIA'S, has intermittent dizziness, has tingling and numbness in both hands, weakness in both hands, moreso in left.   Carotid Duplex on file result was from 8/5/20013, showed 60-79% right ICA stenosis and 40-59% left ICA stenosis, requested by Dr. Burt Knack, pt does not recall this.   March, 2015 carotid Duplex demonstrated <40% stenosis in both internal carotid arteries.  Her DM is better controlled than at her September 2015 visit and she continues to smoke.   She has known lumbar spine issues with radiculopathy in both legs.   She gets choked easily, has to drink through a straw.   She has dry cracked fissures on both heals, no infection, no drainage, no other non healing wounds., this is improving.     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   She has intermittent dizziness, has tingling and numbness in both hands, weakness in both hands, moreso in left.   She had severe headaches several years ago which prompted brain imaging, states was found she had 3 ministrokes, she does not recall any TIA symptoms other than the headache, denies lateralizing symptoms.    She was hospitalized recently at [facility] for dehydration, does not know how she got dehydrated.   She denies cough or dyspnea, states her sinuses remain "stopped up", she is currently taking an antibx for a sinus infection,  prescribed by her PCP, per pt.  Pt Diabetic: Yes, uncontrolled but imporved, states her home glucose readings are 100-179. Pt smoker: smoker (1/2 ppd, started at age 66 yrs)   Pt meds include:   Statin :Yes  ASA: No  Other anticoagulants/antiplatelets: Plavix        Past Medical History   Diagnosis  Date   .  Hyperlipidemia     .  Arthritis     .  Hypothyroidism     .  GERD (gastroesophageal reflux disease)     .  Anxiety     .  Diabetes mellitus  age 50       insulin dependent   .  Peripheral vascular disease     .  Lumbar disc disease     .  Insulin dependent type 2 diabetes mellitus, controlled     .  GERD  07/20/2006   .  CAD (coronary artery disease)     .  CAD (coronary artery disease) of bypass graft     .  Vertigo     .  History of transient ischemic attack (TIA)     .  Myocardial infarction         AGE 62     .  Anginal pain     .  Hypertension     .  Depression     .  COPD  12/14/2008       PATIENT DENIES     .  Stroke         3 MINI STROKES  RT SIDED WEAKNESS   .  Personal history of malignant neoplasm of kidney(V10.52)  12/14/2008       Laparoscopic biopsy and cryoablation 7/08 Dr. Vernie Shanks    .  Headache(784.0)     .  Neuromuscular disorder         PERIPHERAL NEUROPATHY   .  Anemia     .  Cancer         CANCER OF KIDNEY  DR DALDSTEDT    .  Diabetic coma  Feb. 2014   .  Fall at home  Jan. 2, 2016  Jan. 13, 2016       Left knee     Social History History   Substance Use Topics   .  Smoking status:  Former Smoker -- 0.50 packs/day for 50 years       Types:  Cigarettes       Quit date:  07/12/2011   .  Smokeless tobacco:  Never Used   .  Alcohol Use:  No     Family History Family History   Problem  Relation  Age of Onset   .  Heart disease  Mother     .  Heart disease  Father     .  Heart disease  Sister     .  Lung cancer  Mother     .  Lung cancer  Father         Past Surgical History   Procedure  Laterality  Date   .  Appendectomy        .  Abdominal hysterectomy       .  Knee surgery       .  Cholecystectomy       .  Lung surgery           lung nodule removed from the right side   .  Laparascopic cryoablation of left kidney    08/2006       Dr. Gladis Riffle for renal cell cancer   .  Bladder suspension       .  Shoulder surgery       .  Foot surgery       .  Pr vein bypass graft,aorto-fem-pop    05/27/2010   .  Cardiac catheterization       .  Femoral-popliteal bypass graft    10/12/2011       Procedure: BYPASS GRAFT FEMORAL-POPLITEAL ARTERY;  Surgeon: Elam Dutch, MD; Location: [facility] OR;  Service: Vascular;  Laterality: Left;  Left Femoral-Popliteal Bypass Graft using 79mm x 80cm Propaten Graft with intraop arteriogram times one.   .  Intraoperative arteriogram    10/12/2011       Procedure: INTRA OPERATIVE ARTERIOGRAM;  Surgeon: Elam Dutch, MD;  Location: [place];  Service: Vascular;  Laterality: Left;   .  Left heart catheterization with coronary angiogram  N/A  05/11/2011       Procedure: LEFT HEART CATHETERIZATION WITH CORONARY ANGIOGRAM;  Surgeon: Jolaine Artist, MD;  Location: [facility] CATH LAB;  Service: Cardiovascular;  Laterality: N/A;   .  Abdominal aortagram  N/A  08/21/2011       Procedure: ABDOMINAL Maxcine Ham;  Surgeon: Elam Dutch, MD;  Location: [facility] CATH LAB;  Service: Cardiovascular;  Laterality: N/A;     No Known Allergies    Current Outpatient Prescriptions   Medication  Sig  Dispense  Refill   .  Alcohol Swabs (B-D SINGLE USE SWABS REGULAR) PADS         .  amitriptyline (ELAVIL) 75 MG tablet  Take 75 mg by mouth at bedtime.       Marland Kitchen  amLODipine (NORVASC) 10 MG tablet  Take 10 mg by mouth daily.        .  cloNIDine (CATAPRES) 0.3 MG tablet  Take 0.3 mg by mouth daily.        .  clopidogrel (PLAVIX) 75 MG tablet  Take 75 mg by mouth daily.        .  cyclobenzaprine (FLEXERIL) 10 MG tablet  Take 10 mg by mouth at bedtime as needed for muscle spasms.        .  diphenhydrAMINE (BENADRYL) 25 MG tablet   Take 25 mg by mouth every 6 (six) hours as needed for itching or allergies.       .  diphenhydrAMINE (BENADRYL) 25 MG tablet  Take 2 tablets (50 mg total) by mouth every 4 (four) hours as needed for itching.  20 tablet  0   .  DULoxetine (CYMBALTA) 30 MG capsule  Take 30 mg by mouth at bedtime.       .  hydrALAZINE (APRESOLINE) 25 MG tablet  Take 25 mg by mouth 2 (two) times daily.        .  hydrochlorothiazide (HYDRODIURIL) 25 MG tablet  Take 25 mg by mouth daily.        Marland Kitchen  HYDROcodone-acetaminophen (NORCO) 10-325 MG per tablet  Take 1 tablet by mouth every 4 (four) hours as needed (pain).        .  insulin aspart (NOVOLOG) 100 UNIT/ML injection  Inject 5-10 Units into the skin 3 (three) times daily as needed for high blood sugar (cbg over 200). Per sliding scale       .  insulin glargine (LANTUS) 100 UNIT/ML injection  Inject 80 Units into the skin at bedtime. Dispense syringes and needles in needed, dispense Lantus pen if approved by insurance.       .  lansoprazole (PREVACID) 30 MG capsule  Take 30 mg by mouth daily.        Marland Kitchen  levothyroxine (SYNTHROID, LEVOTHROID) 88 MCG tablet  Take 88 mcg by mouth daily before breakfast.       .  LORazepam (ATIVAN) 1 MG tablet  Take 3 mg by mouth at bedtime.       .  methocarbamol (ROBAXIN) 500 MG tablet  Take 1 tablet (500 mg total) by mouth 2 (two) times daily as needed for muscle spasms.  20 tablet  0   .  pravastatin (PRAVACHOL) 20 MG tablet  Take 20 mg by mouth daily.       .  promethazine (PHENERGAN) 25 MG suppository  Place 1 suppository (25 mg total) rectally every 6 (six) hours as needed for nausea or vomiting.  2 suppository  0   .  tolterodine (DETROL LA) 4 MG 24 hr capsule  Take 4 mg by mouth daily.        .  promethazine (PHENERGAN) 25 MG tablet  Take 1 tablet (25 mg total) by mouth every 6 (six) hours as needed for nausea or vomiting. (Patient not taking: Reported on 03/07/2014)  10 tablet  0   .  [DISCONTINUED] Gabapentin (NEURONTIN PO)  Take by  mouth. New medicine, pt doesn't remember dosage          No current  facility-administered medications for this visit.     ROS: See HPI for pertinent positives and negatives.   Physical Examination     Filed Vitals:   04/17/14 0630  BP: 140/61  Pulse: 87  Temp: 98.4 F (36.9 C)  TempSrc: Oral  Resp: 20  Weight: 164 lb (74.39 kg)  SpO2: 98%   .  General: A&O x 3, WDWN, obese female.  Gait: normal   Eyes: PERRLA.  Pulmonary: fine rales in bases, without wheezes ,  or rhonchi. No dyspnea. Cardiac: regular Rythm , without detected murmur.     Carotid Bruits    Left   Right       Negative    Negative     Aorta is not palpable.   Radial pulses: right is 1+ palpable, left is not present, left brachial pulse is 2+ palpable   VASCULAR EXAM:   Extremities without ischemic changes   without Gangrene; without open wounds. Heels have some mild cracking.    LE Pulses    LEFT    RIGHT     FEMORAL    2+ palpable   not palpable     POPLITEAL    not palpable    not palpable    POSTERIOR TIBIAL    not palpable    not palpable     DORSALIS PEDIS   ANTERIOR TIBIAL    2+palpable    not palpable      Abdomen: soft, NT, no masses.   Skin: no rashes, no ulcers noted.   Musculoskeletal: no muscle wasting or atrophy.   Neurologic: A&O X 3; Appropriate Affect ; SENSATION: normal; MOTOR FUNCTION: moving all extremities equally, motor strength 5/5 throughout. Speech is fluent/normal. CN 2-12 intact except for uvula deviation to the right.    Non-Invasive Vascular Imaging: DATE: 03/07/2014 LOWER EXTREMITY ARTERIAL DUPLEX EVALUATION      INDICATION:  Follow-up left lower extremity graft; patient complains of new onset of left calf pain      PREVIOUS INTERVENTION(S):  Left femoropopliteal arterial bypass graft placed 10/12/2011      DUPLEX EXAM:        RIGHT    LEFT    Peak Systolic Velocity (cm/s)  Ratio (if abnormal)  Waveform   Peak Systolic Velocity (cm/s)  Ratio (if abnormal)   Waveform         Inflow Artery  127    T         Proximal Anastomosis  0    Absent         Proximal Graft  0    Absent         Mid Graft  0    Absent          Distal Graft  0    Absent         Distal Anastomosis  0    Absent         Outflow Artery  48    M   0.76  Today's ABI / TBI  0.68   0.73  Previous ABI / TBI (11/02/2013 )  1.11      Waveform:    M - Monophasic       B - Biphasic       T - Triphasic   If Ankle Brachial Index (ABI) or Toe Brachial Index (TBI) performed, please see complete report   ADDITIONAL FINDINGS:        IMPRESSION:  Occluded left femoropopliteal arterial bypass graft  Compared to the previous exam:   NA      ASSESSMENT: lifestyle limiting claudication left leg  PLAN:  Left fem pop bypass today.  Risks benefit complications including but not limited to bleeding infection graft occlusion limb loss myocardial events.  Ruta Hinds, MD [facility] [place] Office: [phone] Pager: [phone]

## 2014-04-17 NOTE — Progress Notes (Signed)
        Patient alert and oriented x 3. Incisions are soft and dry Doppler biphasic PT/DP Disposition Stable   S/P REDO LEFT FEMORAL-POPLITEAL ARTERY BYPASS USING GORE PROPATEN 72mmx80cm GRAFT (Left)  Takara Sermons MAUREEN PA-C

## 2014-04-17 NOTE — Transfer of Care (Signed)
Immediate Anesthesia Transfer of Care Note  Patient: April Branch  Procedure(s) Performed: Procedure(s): REDO LEFT FEMORAL-POPLITEAL ARTERY BYPASS USING GORE PROPATEN 8mmx80cm GRAFT (Left)  Patient Location: PACU  Anesthesia Type:General  Level of Consciousness: awake, alert  and oriented  Airway & Oxygen Therapy: Patient Spontanous Breathing and Patient connected to nasal cannula oxygen  Post-op Assessment: Report given to RN and Post -op Vital signs reviewed and stable  Post vital signs: Reviewed and stable  Last Vitals:  Filed Vitals:   04/17/14 0630  BP: 140/61  Pulse: 87  Temp: 36.9 C  Resp: 20    Complications: No apparent anesthesia complications

## 2014-04-17 NOTE — Anesthesia Postprocedure Evaluation (Signed)
  Anesthesia Post-op Note  Patient: April Branch  Procedure(s) Performed: Procedure(s): REDO LEFT FEMORAL-POPLITEAL ARTERY BYPASS USING GORE PROPATEN 84mmx80cm GRAFT (Left)  Patient Location: PACU  Anesthesia Type:General  Level of Consciousness: awake, alert , oriented and patient cooperative  Airway and Oxygen Therapy: Patient Spontanous Breathing and Patient connected to nasal cannula oxygen  Post-op Pain: none  Post-op Assessment: Post-op Vital signs reviewed, Patient's Cardiovascular Status Stable, Respiratory Function Stable, Patent Airway, No signs of Nausea or vomiting and Pain level controlled  Post-op Vital Signs: Reviewed and stable  Last Vitals:  Filed Vitals:   04/17/14 1344  BP: 114/82  Pulse: 73  Temp: 36.6 C  Resp: 16    Complications: No apparent anesthesia complications

## 2014-04-17 NOTE — Progress Notes (Signed)
Utilization review completed.  

## 2014-04-17 NOTE — Progress Notes (Signed)
Patient initially stated that she took beta blocker this am. When Dr. Glennon Mac went in patient advised that she did not take betablocker. Requested same from pharmacy

## 2014-04-17 NOTE — Progress Notes (Deleted)
PCA reduced fentanyl tubing changed due to leaking connection. Barbara Grogan, RN witnessed pca tubing wasted with approxiately 10 ml of reduced fentanyl.  

## 2014-04-17 NOTE — Progress Notes (Signed)
Inpatient Diabetes Program Recommendations  AACE/ADA: New Consensus Statement on Inpatient Glycemic Control (2013)  Target Ranges:  Prepandial:   less than 140 mg/dL      Peak postprandial:   less than 180 mg/dL (1-2 hours)      Critically ill patients:  140 - 180 mg/dL  Results for April Branch, April Branch (MRN [id number]) as of 04/17/2014 11:56  Ref. Range 04/17/2014 06:29  Glucose-Capillary Latest Range: 70-99 mg/dL 293 (H)   Inpatient Diabetes Program Recommendations Insulin - Basal: start a portion of patient's home dose Lantus Correction (SSI): sensitive scale TID  Thank you  Raoul Pitch BSN, RN,CDE Inpatient Diabetes Coordinator [phone] (team pager)

## 2014-04-17 NOTE — Anesthesia Preprocedure Evaluation (Signed)
Anesthesia Evaluation  Patient identified by MRN, date of birth, ID band Patient awake    Reviewed: Allergy & Precautions, NPO status , Patient's Chart, lab work & pertinent test results  History of Anesthesia Complications Negative for: history of anesthetic complications  Airway Mallampati: II       Dental  (+) Edentulous Upper, Edentulous Lower   Pulmonary COPDCurrent Smoker,  breath sounds clear to auscultation        Cardiovascular hypertension, Pt. on medications and Pt. on home beta blockers - angina+ CAD, + Past MI and + Peripheral Vascular Disease Rhythm:Regular Rate:Normal  '13 ECHO: EF 55-60%, valves OK 04/10/14 stress test: normal perfusion   Neuro/Psych  Headaches, TIA   GI/Hepatic GERD-  Medicated and Controlled,  Endo/Other  diabetes (glu 293), Insulin DependentHypothyroidism Morbid obesity  Renal/GU Renal InsufficiencyRenal disease (creat 1.30)     Musculoskeletal   Abdominal (+) + obese,   Peds  Hematology   Anesthesia Other Findings   Reproductive/Obstetrics                             Anesthesia Physical Anesthesia Plan  ASA: III  Anesthesia Plan: General   Post-op Pain Management:    Induction: Intravenous  Airway Management Planned: Oral ETT  Additional Equipment:   Intra-op Plan:   Post-operative Plan: Extubation in OR  Informed Consent: I have reviewed the patients History and Physical, chart, labs and discussed the procedure including the risks, benefits and alternatives for the proposed anesthesia with the patient or authorized representative who has indicated his/her understanding and acceptance.     Plan Discussed with: CRNA and Surgeon  Anesthesia Plan Comments: (Plan routine monitors, GETA)        Anesthesia Quick Evaluation

## 2014-04-17 NOTE — Op Note (Signed)
Procedure: Left femoral to below knee popliteal bypass with Propaten  Preoperative diagnosis: Claudication  Postoperative diagnosis: Same  Anesthesia: General  Asst.: Silva Bandy, PA-c  Operative findings:     6 mm propaten PTFE  Operative details: After obtaining informed consent, the patient was taken to the operating room. The patient was placed in supine position on the operating room table. After induction of general anesthesia and endotracheal intubation, a Foley catheter was placed. Next, the patient's entire left lower extremity was prepped and draped in the usual sterile fashion.      A longitudinal incision was then made in the left groin and carried down through the subcutaneous tissues to expose the left common femoral artery. The common femoral artery was dissected free circumferentially. There was a pulse within the common femoral artery. The distal external iliac artery was dissected free circumferentially underneath the inguinal ligament.  A vessel loop was also placed around the distal external iliac artery. There was fairly dense scar around the femoral bifurcation from a prior fem pop. The prior bypass graft was located and dissected free circumferentially.  The profunda femoris and superficial femoral arteries were also dissected free circumferentially as well and vessel loops placed around these.  The artery overall was soft in character.  An incision was made on the medial aspect of the leg below the knee.  The incision was deepened and the below knee popliteal space was entered.  The popliteal artery was dissected free circumferentially. It again was fairly scarred from prior bypass. It was calcified in islands but was clampable. The prior bypass graft was dissected free circumferentially as well as the native popliteal artery above the prior anastomosis.  The popliteal artery was fairly small about 3 mm diameter.  A tunnel was then created between the heads of the gastrocnemius  muscle subsartorial up to the groin.  The patient was given 9000 units of heparin.  After appropriate circulation time, the distal left external iliac artery was controlled with a vessel loop. The profunda was controlled with a vessel loop and the SFA controlled with a fistula clamp.   A longitudinal opening was made in the common femoral artery on its anterior surface.  The old anastomosis was taken down.  The vessel was thickened but soft.  A 6 mm Propaten graft was then brought up on the operative field and spatulated.  The graft was then sewn end of graft to side of artery using a running 5 0 Prolene.  The graft was clamped just after its origin.  Flow was restored to the native SFA and profunda.  The anastomosis was packed with thrombin and gelfoam.  The graft was then brought through the subsartorial tunnel down to the below-knee popliteal artery after marking for orientation. The below-knee popliteal artery was controlled proximally with a fine bulldog clamp and distally the anterior tibial artery and tibioperoneal trunk were controlled with vessel loops. A longitudinal opening was made in the distal below-knee popliteal artery in an area that was fairly free of calcification below the prior anastomosis and extending to the takeoff of the tibial vessels. The graft was then cut to length and spatulated and sewn end of graft to side of artery using running 6-0 Prolene suture.  At completion of the anastomosis everything was forebled backbled and thoroughly flushed. The remainder of the anastomosis was completed and all clamps were removed restoring pulsatile flow to the below-knee popliteal artery.  The patient had good PT and DP doppler flow which  augmented about 50-70% with unclamping.    Each anastomosis was then packed with thrombin and gelfoam until hemostasis was obtained.  The wounds were then irrigated and the gelfoam removed.  After hemostasis was obtained, the deep layers and subcutaneous layers of  the below-knee popliteal incision were closed with running 3-0 Vicryl suture. The skin was closed with a 4 0 vicryl subcuticular stitch. The groin was inspected and found to be hemostatic. This was then closed in multiple layers of running 2 0 and 3-0 Vicryl suture and 4-0 subcuticular stitch. Dermabond was applied to the groin incision.  The patient tolerated the procedure well and there were no complications. Instrument sponge and needle counts correct in the case. Patient was taken to the recovery in stable condition.  Ruta Hinds, MD

## 2014-04-18 LAB — BASIC METABOLIC PANEL
Anion gap: 6 (ref 5–15)
BUN: 14 mg/dL (ref 6–23)
CHLORIDE: 107 mmol/L (ref 96–112)
CO2: 24 mmol/L (ref 19–32)
CREATININE: 1.05 mg/dL (ref 0.50–1.10)
Calcium: 7.8 mg/dL — ABNORMAL LOW (ref 8.4–10.5)
GFR calc Af Amer: 63 mL/min — ABNORMAL LOW (ref >90)
GFR calc non Af Amer: 54 mL/min — ABNORMAL LOW (ref >90)
Glucose, Bld: 175 mg/dL — ABNORMAL HIGH (ref 70–99)
POTASSIUM: 4.2 mmol/L (ref 3.5–5.1)
Sodium: 137 mmol/L (ref 135–145)

## 2014-04-18 LAB — GLUCOSE, CAPILLARY
Glucose-Capillary: 186 mg/dL — ABNORMAL HIGH (ref 70–99)
Glucose-Capillary: 155 mg/dL — ABNORMAL HIGH (ref 70–99)
Glucose-Capillary: 181 mg/dL — ABNORMAL HIGH (ref 70–99)
Glucose-Capillary: 227 mg/dL — ABNORMAL HIGH (ref 70–99)
Glucose-Capillary: 256 mg/dL — ABNORMAL HIGH (ref 70–99)

## 2014-04-18 LAB — CBC
HCT: 26.3 % — ABNORMAL LOW (ref 36.0–46.0)
Hemoglobin: 8.6 g/dL — ABNORMAL LOW (ref 12.0–15.0)
MCH: 28 pg (ref 26.0–34.0)
MCHC: 32.7 g/dL (ref 30.0–36.0)
MCV: 85.7 fL (ref 78.0–100.0)
PLATELETS: 224 10*3/uL (ref 150–400)
RBC: 3.07 MIL/uL — AB (ref 3.87–5.11)
RDW: 13.9 % (ref 11.5–15.5)
WBC: 8.7 10*3/uL (ref 4.0–10.5)

## 2014-04-18 MED ORDER — INSULIN ASPART 100 UNIT/ML ~~LOC~~ SOLN
0.0000 [IU] | SUBCUTANEOUS | Status: DC
  Administered 2014-04-18: 5 [IU] via SUBCUTANEOUS
  Administered 2014-04-18: 3 [IU] via SUBCUTANEOUS
  Administered 2014-04-18: 8 [IU] via SUBCUTANEOUS
  Administered 2014-04-18 – 2014-04-19 (×2): 3 [IU] via SUBCUTANEOUS
  Administered 2014-04-19: 2 [IU] via SUBCUTANEOUS

## 2014-04-18 NOTE — Evaluation (Signed)
Occupational Therapy Evaluation Patient Details Name: April Branch MRN: [id number] DOB: July 30, 1945 Today's Date: 04/18/2014    History of Present Illness pt presents with redo of L Fem Pop.  pt with hx of CVA, MI, Neuropathies, Anxiety, and Depression.     Clinical Impression   PTA pt lived at home with her son and was independent with ADLs. Pt is currently limited by decreased ROM and pain in LLE and requires assist for LB ADLs and min A for functional mobility. Pt will benefit from acute OT to increase independence with ADLs.     Follow Up Recommendations  Home health OT;Supervision/Assistance - 24 hour    Equipment Recommendations  3 in 1 bedside comode    Recommendations for Other Services       Precautions / Restrictions Precautions Precautions: Fall Restrictions Weight Bearing Restrictions: No      Mobility Bed Mobility Overal bed mobility: Needs Assistance Bed Mobility: Supine to Sit     Supine to sit: Supervision Sit to supine: Supervision   General bed mobility comments: pt moves slowly and utilizes bed rail, but no physical A needed.    Transfers Overall transfer level: Needs assistance Equipment used: Rolling walker (2 wheeled) Transfers: Sit to/from [employer] Sit to Stand: Min assist Stand pivot transfers: Min assist       General transfer comment: Min A to power up safely and stabilize RW. VC's for hand placement.          ADL Overall ADL's : Needs assistance/impaired Eating/Feeding: Independent;Sitting   Grooming: Set up;Sitting   Upper Body Bathing: Set up;Sitting   Lower Body Bathing: Minimal assistance;Sit to/from stand   Upper Body Dressing : Set up;Sitting   Lower Body Dressing: Moderate assistance;Sit to/from stand   Toilet Transfer: Minimal assistance;RW;Stand-pivot;BSC   Toileting- Clothing Manipulation and Hygiene: Minimal assistance;Sit to/from stand       Functional mobility during ADLs:  Minimal assistance;Rolling walker General ADL Comments: Pt was agreeable to get OOB to attempt toileting. Transferred to [facility] with Min A, then ambulated to recliner chair with min A. Educated pt on safety with fall prevention and energy conservation.      Vision  Pt reports no change from baseline.           Pertinent Vitals/Pain Pain Assessment: 0-10 Pain Score: 7  Faces Pain Scale: Hurts even more Pain Location: LLE pain (leg and groin) Pain Descriptors / Indicators: Aching;Constant Pain Intervention(s): Limited activity within patient's tolerance;Monitored during session;Repositioned     Hand Dominance Right   Extremity/Trunk Assessment Upper Extremity Assessment Upper Extremity Assessment: Overall WFL for tasks assessed   Lower Extremity Assessment Lower Extremity Assessment: Defer to PT evaluation LLE Deficits / Details: ROM and Strength limited by post-op pain.   LLE: Unable to fully assess due to pain LLE Sensation: decreased light touch LLE Coordination: decreased fine motor;decreased gross motor   Cervical / Trunk Assessment Cervical / Trunk Assessment: Normal   Communication Communication Communication: No difficulties   Cognition Arousal/Alertness: Awake/alert Behavior During Therapy: WFL for tasks assessed/performed Overall Cognitive Status: Within Functional Limits for tasks assessed                                Home Living Family/patient expects to be discharged to:: Private residence Living Arrangements: Children (adult son lives with pt) Available Help at Discharge: Family;Available 24 hours/day (initially, then he returns to work) Type of Home: [employer]  Home Access: Stairs to enter [employer] of Steps: 2 Entrance Stairs-Rails: Right;Left Home Layout: One level     Bathroom Shower/Tub: Occupational psychologist: Standard     Home Equipment: Environmental consultant - 2 wheels   Additional Comments: has 5 dogs      Prior  Functioning/Environment Level of Independence: Independent             OT Diagnosis: Generalized weakness;Acute pain   OT Problem List: Decreased strength;Decreased range of motion;Decreased activity tolerance;Impaired balance (sitting and/or standing);Pain   OT Treatment/Interventions: Self-care/ADL training;Therapeutic exercise;Energy conservation;DME and/or AE instruction;Therapeutic activities;Patient/family education;Balance training    OT Goals(Current goals can be found in the care plan section) Acute Rehab OT Goals Patient Stated Goal: to get home to my dogs OT Goal Formulation: With patient Time For Goal Achievement: 05/02/14 Potential to Achieve Goals: Good ADL Goals Pt Will Perform Grooming: with set-up;with supervision;standing Pt Will Perform Lower Body Bathing: with set-up;with supervision;sit to/from stand Pt Will Perform Lower Body Dressing: with set-up;with supervision;sit to/from stand Pt Will Transfer to Toilet: with set-up;with supervision;ambulating;bedside commode Pt Will Perform Toileting - Clothing Manipulation and hygiene: with supervision;with set-up;sit to/from stand Pt Will Perform Tub/Shower Transfer: Shower transfer;with supervision;ambulating;3 in 1;rolling walker  OT Frequency: Min 2X/week              End of Session Equipment Utilized During Treatment: Gait belt;Rolling walker Nurse Communication: Mobility status  Activity Tolerance: Patient tolerated treatment well Patient left: in chair;with call bell/phone within reach   Time: 1151-1218 OT Time Calculation (min): 27 min Charges:  OT General Charges $OT Visit: 1 Procedure OT Evaluation $Initial OT Evaluation Tier I: 1 Procedure OT Treatments $Self Care/Home Management : 8-22 mins G-Codes:    Villa Herb M 04-20-2014, 1:16 PM   Cyndie Chime, OTR/L Occupational Therapist [phone] (pager)

## 2014-04-18 NOTE — Progress Notes (Addendum)
  [facility] Progress Note  04/18/2014 7:43 AM 1 Day Post-Op  Subjective:  Having 10/10 pain in left leg at incisions and left calf.   Filed Vitals:   04/18/14 0400  BP: 100/44  Pulse: 90  Temp: 98.9 F (37.2 C)  Resp: 27    Physical Exam: Incisions:  Left groin and left below knee incisions are clean, dry and intact.  Extremities:  Biphasic left PT and DP doppler signals. Left groin without hematoma. Left calf is soft and mild tenderness to palpation.   CBC    Component Value Date/Time   WBC 8.7 04/18/2014 0254   RBC 3.07* 04/18/2014 0254   HGB 8.6* 04/18/2014 0254   HCT 26.3* 04/18/2014 0254   PLT 224 04/18/2014 0254   MCV 85.7 04/18/2014 0254   MCH 28.0 04/18/2014 0254   MCHC 32.7 04/18/2014 0254   RDW 13.9 04/18/2014 0254   LYMPHSABS 1.7 09/09/2013 1229   MONOABS 0.4 09/09/2013 1229   EOSABS 0.2 09/09/2013 1229   BASOSABS 0.0 09/09/2013 1229    BMET    Component Value Date/Time   NA 137 04/18/2014 0254   K 4.2 04/18/2014 0254   CL 107 04/18/2014 0254   CO2 24 04/18/2014 0254   GLUCOSE 175* 04/18/2014 0254   BUN 14 04/18/2014 0254   CREATININE 1.05 04/18/2014 0254   CALCIUM 7.8* 04/18/2014 0254   GFRNONAA 54* 04/18/2014 0254   GFRAA 63* 04/18/2014 0254    INR    Component Value Date/Time   INR 0.99 04/10/2014 1037     Intake/Output Summary (Last 24 hours) at 04/18/14 0743 Last data filed at 04/18/14 0400  Gross per 24 hour  Intake   3450 ml  Output   1195 ml  Net   2255 ml     Assessment:  69 y.o. female is s/p: left femoral to below knee popliteal bypass with Propaten 1 Day Post-Op  Plan: -Brisk left PT and DP signals.  -Having appropriate post-operative pain.  -Incisions are fine. Dry gauze to left groin.  -Acute surgical blood loss anemia: Hgb 8.6 today. Stable, asymptomatic. -D/C foley and mobilize. PT and OT today.  -DM: SSI, home dose of lantus -DVT prophylaxis:  Lovenox -Transfer to 2W.   Virgina Jock,  PA-C [facility] Office: [phone] Pager: [phone] 04/18/2014 7:43 AM    Agree with above palapable left DP pulse Transfer 2W Ambulate Most likely home 04/20/14  Ruta Hinds, MD [facility] [place]: [phone] Pager: [phone]

## 2014-04-18 NOTE — Progress Notes (Signed)
Patient arrived from [facility]. Patient is A/O, VSS , SR on the monitor. POC discussed with the patient, patient verbalized understanding. Patient currently resting comfortably in bed. [place], RN

## 2014-04-18 NOTE — Progress Notes (Signed)
Pt tx 2W per MD order, pt VSS, pt tol well, pt verbalized understanding of tx, report called to receiving RN all questions answered

## 2014-04-18 NOTE — Clinical Documentation Improvement (Signed)
Please specify diagnosis related to below supporting information if appropriate.    Possible Clinical Conditions?    Expected Acute Blood Loss Anemia  Acute Blood Loss Anemia  Acute on chronic blood loss anemia  Chronic blood loss anemia  Precipitous drop in Hematocrit  Other Condition________________  Cannot Clinically Determine   Supporting Information:   Patient has past history of anemia,  S/p Left femoral to below knee popliteal bypass with Propaten  On 04/17/14.     Patient's Labs this admission  Component     Latest Ref Rng 04/17/2014         3:10 PM  Hemoglobin     12.0 - 15.0 g/dL 9.9 (L)  HCT     36.0 - 46.0 % 29.8 (L)   Component     Latest Ref Rng 04/18/2014          Hemoglobin     12.0 - 15.0 g/dL 8.6 (L)  HCT     36.0 - 46.0 % 26.3 (L)     Thank You, Serena Colonel ,RN Clinical Documentation Specialist:  [facility] Information Management

## 2014-04-18 NOTE — Progress Notes (Signed)
Physical Therapy Evaluation  Past Medical History  Diagnosis Date  . Hyperlipidemia   . Arthritis   . Hypothyroidism   . GERD (gastroesophageal reflux disease)   . Anxiety   . Diabetes mellitus age 69    insulin dependent  . Peripheral vascular disease   . Lumbar disc disease   . Insulin dependent type 2 diabetes mellitus, controlled   . GERD 07/20/2006  . CAD (coronary artery disease)   . CAD (coronary artery disease) of bypass graft   . Vertigo   . History of transient ischemic attack (TIA)   . Myocardial infarction     AGE 47    . Hypertension   . Depression   . COPD 12/14/2008    PATIENT DENIES    . Personal history of malignant neoplasm of kidney(V10.52) 12/14/2008    Laparoscopic biopsy and cryoablation 7/08 Dr. Vernie Shanks   . Headache(784.0)   . Neuromuscular disorder     PERIPHERAL NEUROPATHY  . Anemia   . Diabetic coma Feb. 2014  . Fall at home Jan. 2, 2016  Jan. 13, 2016    Left knee  . Anginal pain     1 MONTH AGO   . Stroke     3 MINI STROKES  RT SIDED WEAKNESS  . 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 Fibromyalgia   . HPV (human papilloma virus) infection    Past Surgical History  Procedure Laterality Date  . Appendectomy    . Abdominal hysterectomy    . Knee surgery    . Cholecystectomy    . Lung surgery      lung nodule removed from the right side  . Laparascopic cryoablation of left kidney  08/2006    Dr. Gladis Riffle for renal cell cancer  . Bladder suspension    . Shoulder surgery    . Foot surgery    . Pr vein bypass graft,aorto-fem-pop  05/27/2010  . Cardiac catheterization    . Femoral-popliteal bypass graft  10/12/2011    Procedure: BYPASS GRAFT FEMORAL-POPLITEAL ARTERY;  Surgeon: Elam Dutch, MD;  Location: [facility] OR;  Service: Vascular;  Laterality: Left;  Left Femoral-Popliteal Bypass Graft using 41mm x 80cm Propaten Graft with intraop arteriogram times one.  . Intraoperative arteriogram  10/12/2011    Procedure: INTRA OPERATIVE  ARTERIOGRAM;  Surgeon: Elam Dutch, MD;  Location: [place];  Service: Vascular;  Laterality: Left;  . Left heart catheterization with coronary angiogram N/A 05/11/2011    Procedure: LEFT HEART CATHETERIZATION WITH CORONARY ANGIOGRAM;  Surgeon: Jolaine Artist, MD;  Location: [facility] CATH LAB;  Service: Cardiovascular;  Laterality: N/A;  . Abdominal aortagram N/A 08/21/2011    Procedure: ABDOMINAL Maxcine Ham;  Surgeon: Elam Dutch, MD;  Location: [facility] CATH LAB;  Service: Cardiovascular;  Laterality: N/A;  . Abdominal aortagram N/A 03/16/2014    Procedure: ABDOMINAL Maxcine Ham;  Surgeon: Elam Dutch, MD;  Location: [facility] CATH LAB;  Service: Cardiovascular;  Laterality: N/A;   Pt generally weak and deconditioned, but states her son is available to A her at home.  During session pt's IV leaking, RN arrived to address.  Will continue to follow while on acute.     04/18/14 1200  PT Visit Information  Last PT Received On 04/18/14  Assistance Needed +1  History of Present Illness pt presents with redo of L Fem Pop.  pt with hx of CVA, MI, Neuropathies, Anxiety, and Depression.    Precautions  Precautions  Fall  Restrictions  Weight Bearing Restrictions No  Home Living  Family/patient expects to be discharged to: Private residence  Living Arrangements Children  Available Help at Discharge Family ("almost all the time")  Type of [place] to enter  Entrance Stairs-Number of Steps 2  [place] One level  [place] - 2 wheels  Prior Function  Level of Independence Independent  Communication  Communication No difficulties  Pain Assessment  Pain Assessment Faces  Faces Pain Scale 6  Pain Location L LE  Pain Descriptors / Indicators Aching;Burning  Pain Intervention(s) Monitored during session;Premedicated before session;Repositioned  Cognition  Arousal/Alertness Awake/alert  Behavior During Therapy WFL for tasks  assessed/performed  Overall Cognitive Status Within Functional Limits for tasks assessed  Upper Extremity Assessment  Upper Extremity Assessment Defer to OT evaluation  Lower Extremity Assessment  Lower Extremity Assessment LLE deficits/detail  LLE Deficits / Details ROM and Strength limited by post-op pain.    LLE Unable to fully assess due to pain  LLE Sensation decreased light touch  LLE Coordination decreased fine motor;decreased gross motor  Cervical / Trunk Assessment  Cervical / Trunk Assessment Normal  Bed Mobility  Overal bed mobility Needs Assistance  Bed Mobility Supine to Sit;Sit to Supine  Supine to sit Supervision  Sit to supine Supervision  General bed mobility comments pt moves slowly and utilizes bed rail, but no physical A needed.    Transfers  Overall transfer level Needs assistance  Equipment used Rolling walker (2 wheeled)  Transfers Sit to/from Stand  Sit to Stand Min guard  General transfer comment cues for UE use and technique, but no A needed for power up to standing.    Ambulation/Gait  Ambulation/Gait assistance Min guard  Ambulation Distance (Feet) 50 Feet  Assistive device Rolling walker (2 wheeled)  Gait Pattern/deviations Step-to pattern;Decreased step length - right;Decreased stance time - left;Trunk flexed  General Gait Details pt moves slowly and needs cues for positioning within RW and upright posture.    Balance  Overall balance assessment Needs assistance  Sitting-balance support No upper extremity supported;Feet supported  Sitting balance-Leahy Scale Fair  Standing balance support Bilateral upper extremity supported;During functional activity  Standing balance-Leahy Scale Poor  PT - End of Session  Equipment Utilized During Treatment Gait belt  Activity Tolerance Patient tolerated treatment well  Patient left in bed;with call bell/phone within reach  Nurse Communication Mobility status  PT Assessment  PT Therapy Diagnosis  Difficulty  walking  PT Recommendation/Assessment Patient needs continued PT services  PT Problem List Decreased strength;Decreased balance;Decreased activity tolerance;Decreased mobility;Decreased coordination;Decreased knowledge of use of DME;Pain;Impaired sensation  PT Plan  PT Frequency (ACUTE ONLY) Min 3X/week  PT Treatment/Interventions (ACUTE ONLY) DME instruction;Gait training;Stair training;Functional mobility training;Therapeutic activities;Therapeutic exercise;Balance training;Patient/family education  PT Recommendation  Follow Up Recommendations Home health PT;Supervision - Intermittent  PT equipment None recommended by PT  Individuals Consulted  Consulted and Agree with Results and Recommendations Patient  Acute Rehab PT Goals  Patient Stated Goal To get home to her dogs.    PT Goal Formulation With patient  Time For Goal Achievement 04/25/14  Potential to Achieve Goals Good  PT Time Calculation  PT Start Time (ACUTE ONLY) 1028  PT Stop Time (ACUTE ONLY) 1109  PT Time Calculation (min) (ACUTE ONLY) 41 min  PT General Charges  $$ ACUTE PT VISIT 1 Procedure  PT Evaluation  $Initial PT Evaluation Tier I 1 Procedure  PT  Treatments  $Gait Training 8-22 mins  $Therapeutic Activity 8-22 mins   [employer], [place] [phone]

## 2014-04-19 ENCOUNTER — Encounter (HOSPITAL_COMMUNITY): Payer: Self-pay | Admitting: Vascular Surgery

## 2014-04-19 DIAGNOSIS — I70312 Atherosclerosis of unspecified type of bypass graft(s) of the extremities with intermittent claudication, left leg: Secondary | ICD-10-CM

## 2014-04-19 LAB — GLUCOSE, CAPILLARY
GLUCOSE-CAPILLARY: 200 mg/dL — AB (ref 70–99)
Glucose-Capillary: 82 mg/dL (ref 70–99)
Glucose-Capillary: 122 mg/dL — ABNORMAL HIGH (ref 70–99)
Glucose-Capillary: 127 mg/dL — ABNORMAL HIGH (ref 70–99)

## 2014-04-19 NOTE — Progress Notes (Signed)
VASCULAR LAB PRELIMINARY  ARTERIAL  ABI completed:    RIGHT    LEFT    PRESSURE WAVEFORM  PRESSURE WAVEFORM  BRACHIAL 106 Triphasic BRACHIAL 105 Triphasic  DP 56 Monophasic DP 58 Monophasic  AT   AT    PT 54 Monophasic PT 88 Monophasic  PER   PER    GREAT TOE  NA GREAT TOE  NA    RIGHT LEFT  ABI 0.51 0.83    04/19/2014 11:59 AM Mitsuko Luera, RVT, RDCS, RDMS

## 2014-04-19 NOTE — Progress Notes (Signed)
[facility] [place]  Subjective  - feels sore   Objective 120/48 95 99.8 F (37.7 C) (Oral) 18 94%  Intake/Output Summary (Last 24 hours) at 04/19/14 0849 Last data filed at 04/18/14 2023  Gross per 24 hour  Intake    240 ml  Output    250 ml  Net    -10 ml   Left groin incision healing 1-2+ DP pulse left foot  Assessment/Planning: Ambulate in hall D/c home when pain controlled and ambulatory  Kataya Guimont E 04/19/2014 8:49 AM --  Laboratory Lab Results:  Recent Labs  04/17/14 1510 04/18/14 0254  WBC 12.1* 8.7  HGB 9.9* 8.6*  HCT 29.8* 26.3*  PLT 244 224   BMET  Recent Labs  04/17/14 1510 04/18/14 0254  NA  --  137  K  --  4.2  CL  --  107  CO2  --  24  GLUCOSE  --  175*  BUN  --  14  CREATININE 0.98 1.05  CALCIUM  --  7.8*    COAG Lab Results  Component Value Date   INR 0.99 04/10/2014   INR 1.07 10/05/2011   INR 1.08 05/09/2011   No results found for: PTT

## 2014-04-19 NOTE — Progress Notes (Signed)
Inpatient Diabetes Program Recommendations  AACE/ADA: New Consensus Statement on Inpatient Glycemic Control (2013)  Target Ranges:  Prepandial:   less than 140 mg/dL      Peak postprandial:   less than 180 mg/dL (1-2 hours)      Critically ill patients:  140 - 180 mg/dL   Please change Novolog to TID + HS scale instead of Q4. Thank you  Raoul Pitch BSN, RN,CDE Inpatient Diabetes Coordinator [phone] (team pager)

## 2014-04-20 LAB — GLUCOSE, CAPILLARY
GLUCOSE-CAPILLARY: 107 mg/dL — AB (ref 70–99)
Glucose-Capillary: 100 mg/dL — ABNORMAL HIGH (ref 70–99)
Glucose-Capillary: 55 mg/dL — ABNORMAL LOW (ref 70–99)
Glucose-Capillary: 90 mg/dL (ref 70–99)
Glucose-Capillary: 91 mg/dL (ref 70–99)

## 2014-04-20 MED ORDER — HYDROCODONE-ACETAMINOPHEN 10-325 MG PO TABS: 1.0000 | ORAL_TABLET | ORAL | Status: DC | PRN

## 2014-04-20 NOTE — Progress Notes (Signed)
Pt notified of ring and earrings left in room, pt states she will send friend to pick up tomorrow from [place]. Sherrie Mustache 12:57 PM

## 2014-04-20 NOTE — Progress Notes (Addendum)
PA acknowledged of low bp and recommend paging Dr. Oneida Alar, a/w callback. Sherrie Mustache 9:57 AM   MD paged. 10:30 AM   MD wants manual bp in both arm and would like return page. 10:34 AM   MD paged. 10:42 AM   Dr. Oneida Alar would like patient to have metoprolol only and hold other bp/hr meds, aware of drop in hbg from 9.9 to 8.6 and is okay with pt being discharged once he rounds on her this am. 10:45 AM

## 2014-04-20 NOTE — Progress Notes (Signed)
Pt discharged home  Discharge instructions given & reviewed Education discussed  IV dc'd  Tele dc'd  Pt discharged via wheelchair with nursing student All patient belongs at side.   Kathleen Argue S 11:41 AM

## 2014-04-20 NOTE — Progress Notes (Signed)
Physical Therapy Treatment Patient Details Name: April Branch MRN: [id number] DOB: 03/24/1945 Today's Date: 04/20/2014    History of Present Illness pt presents with redo of L Fem Pop.  pt with hx of CVA, MI, Neuropathies, Anxiety, and Depression.      PT Comments    Patient is progressing well towards physical therapy goals, ambulating up to 300 feet. Safely completed stair training today and is tolerating therapeutic exercises well. Feel she is adequate for d/c from a mobility standpoint when medically ready.   Follow Up Recommendations  Home health PT;Supervision - Intermittent     Equipment Recommendations  None recommended by PT    Recommendations for Other Services       Precautions / Restrictions Precautions Precautions: Fall Restrictions Weight Bearing Restrictions: No    Mobility  Bed Mobility               General bed mobility comments: In chair at start of therapy session  Transfers Overall transfer level: Needs assistance Equipment used: Rolling walker (2 wheeled) Transfers: Sit to/from Stand Sit to Stand: Min guard         General transfer comment: Supervision for safety. Correctly places hands on stable surface to rise. Slow to rise but good stability.  Ambulation/Gait Ambulation/Gait assistance: Supervision Ambulation Distance (Feet): 300 Feet Assistive device: Rolling walker (2 wheeled) Gait Pattern/deviations: Step-through pattern;Decreased stride length;Antalgic;Narrow base of support Gait velocity: decreased   General Gait Details: Mildly antalgic gait pattern. VC for upright posture and walker control. Some difficulty with walker placement in narrow spaces. No loss of balance noted during bout.   Stairs Stairs: Yes Stairs assistance: Supervision Stair Management: One rail Left;Step to pattern;Forwards Number of Stairs: 3 General stair comments: Educated on safe stair navigation with cues for sequencing. Able to perform with  out physical assistance.  Wheelchair Mobility    Modified Rankin (Stroke Patients Only)       Balance                                    Cognition Arousal/Alertness: Awake/alert Behavior During Therapy: WFL for tasks assessed/performed Overall Cognitive Status: Within Functional Limits for tasks assessed                      Exercises General Exercises - Lower Extremity Ankle Circles/Pumps: AROM;Both;10 reps;Seated Long Arc Quad: Strengthening;AROM;Left;10 reps;Seated Hip Flexion/Marching: Strengthening;Left;10 reps;Seated;AROM    General Comments        Pertinent Vitals/Pain Pain Assessment: Faces Faces Pain Scale: Hurts little more Pain Location: LLE Pain Descriptors / Indicators: Burning Pain Intervention(s): Monitored during session;Repositioned  SpO2 97% and greater on room air, HR 83    Home Living                      Prior Function            PT Goals (current goals can now be found in the care plan section) Acute Rehab PT Goals PT Goal Formulation: With patient Time For Goal Achievement: 04/25/14 Potential to Achieve Goals: Good Progress towards PT goals: Progressing toward goals    Frequency  Min 3X/week    PT Plan Current plan remains appropriate    Co-evaluation             End of Session Equipment Utilized During Treatment: Gait belt Activity Tolerance: Patient tolerated treatment well Patient left:  with call bell/phone within reach;in chair     Time: 7371-0626 PT Time Calculation (min) (ACUTE ONLY): 14 min  Charges:  $Gait Training: 8-22 mins                    G Codes:      Ellouise Newer 13-May-2014, 10:07 AM Elayne Snare, [place]

## 2014-04-20 NOTE — Progress Notes (Signed)
Occupational Therapy Treatment and Discharge Patient Details Name: April Branch MRN: [id number] DOB: 1945-10-01 Today's Date: 04/20/2014    History of present illness pt presents with redo of L Fem Pop.  pt with hx of CVA, MI, Neuropathies, Anxiety, and Depression.     OT comments  This 69 yo female admitted and underwent above presents to acute OT at a 24/7 S level today for BADLs. She will have this at home per her report for a week then when son is not at work. By one weeks time she should be at a Independent/Mod I level and be able to manage with intermittent S--barring any complicatons medically. Acute OT will sign off.  Follow Up Recommendations  Home health OT;Supervision/Assistance - 24 hour    Equipment Recommendations  3 in 1 bedside comode       Precautions / Restrictions Precautions Precautions: Fall Restrictions Weight Bearing Restrictions: No       Mobility Bed Mobility Overal bed mobility: Independent Bed Mobility: Supine to Sit;Sit to Supine            Transfers Overall transfer level: Needs assistance Equipment used: Rolling walker (2 wheeled) Transfers: Sit to/from Stand Sit to Stand: Supervision                ADL Overall ADL's : Needs assistance/impaired                                       General ADL Comments: Pt is at an overall S level for BADLs and is aware that she cannot take tub baths due to she does not need to submerge wounds on LLE. She agrees that she will take sponge baths until she has the energy to stand and take a shower. Her son is there with her for one week 24/7 and then will be there when he is not at work.                 Cognition   Behavior During Therapy: WFL for tasks assessed/performed Overall Cognitive Status: Within Functional Limits for tasks assessed                                    Pertinent Vitals/ Pain       Pain Assessment: No/denies pain           Frequency Min 2X/week     Progress Toward Goals  OT Goals(current goals can now be found in the care plan section)  Progress towards OT goals: Progressing toward goals     Plan Discharge plan remains appropriate          Activity Tolerance  (pt states she just feels tired due to BP low (which was last time it was taken; however nursing in to check now and it is fine))   Patient Left in bed;with call bell/phone within reach   Nurse Communication  (Hgb low per chart)        Time: 3614-4315 OT Time Calculation (min): 9 min  Charges: OT General Charges $OT Visit: 1 Procedure OT Treatments $Self Care/Home Management : 8-22 mins  Almon Register [phone] 04/20/2014, 10:51 AM

## 2014-04-20 NOTE — Care Management Note (Signed)
    Page 1 of 2   04/20/2014     4:58:35 PM CARE MANAGEMENT NOTE 04/20/2014  Patient:  April Branch, April Branch   Account Number:  [id number]  Date Initiated:  04/20/2014  Documentation initiated by:  Marvetta Gibbons  Subjective/Objective Assessment:   Pt admitted s/p fempop bypass graft     Action/Plan:   PTA pt lived at home   Anticipated DC Date:  04/20/2014   Anticipated DC Plan:  [place]  CM consult      [place]   Choice offered to / List presented to:  C-1 Patient   DME arranged  3-N-1      DME agency  [place] arranged  [facility]      [place].   Status of service:  Completed, signed off Medicare Important Message given?  YES (If response is "NO", the following Medicare IM given date fields will be blank) Date Medicare IM given:  04/20/2014 Medicare IM given by:  Marvetta Gibbons Date Additional Medicare IM given:   Additional Medicare IM given by:    Discharge Disposition:  [place]  Per UR Regulation:  Reviewed for med. necessity/level of care/duration of stay  If discussed at [place] of Stay Meetings, dates discussed:    Comments:  04/20/14- 50- Marvetta Gibbons RN, BSn [phone] Pt for d/c home today, orders for DME and [facility]- spoke with pt - per conversation pt states that she has RW but needs 3n1 for home- since pt has [employer]- pt will use [facility] for DME needs as [employer] has DME contract with them order for 3n1 faxed via epic to [facility] to deliver to home per pt request- offered choice for [facility] services in [facility]- per pt she has used [facility] in past- will use them again- referral called to [place] with [facility] for HH-PT/OT-

## 2014-04-20 NOTE — Progress Notes (Addendum)
  [facility] Progress Note  04/20/2014 7:38 AM 3 Days Post-Op  Subjective:  Having pain with incisions. Pain is improving. Ready to go home.    Filed Vitals:   04/20/14 0439  BP: 99/46  Pulse: 76  Temp: 98.3 F (36.8 C)  Resp: 18    Physical Exam: Incisions:  Left groin incision and below knee incisions clean and dry. Extremities:  1-2+ palpable left DP pulse.   CBC    Component Value Date/Time   WBC 8.7 04/18/2014 0254   RBC 3.07* 04/18/2014 0254   HGB 8.6* 04/18/2014 0254   HCT 26.3* 04/18/2014 0254   PLT 224 04/18/2014 0254   MCV 85.7 04/18/2014 0254   MCH 28.0 04/18/2014 0254   MCHC 32.7 04/18/2014 0254   RDW 13.9 04/18/2014 0254   LYMPHSABS 1.7 09/09/2013 1229   MONOABS 0.4 09/09/2013 1229   EOSABS 0.2 09/09/2013 1229   BASOSABS 0.0 09/09/2013 1229    BMET    Component Value Date/Time   NA 137 04/18/2014 0254   K 4.2 04/18/2014 0254   CL 107 04/18/2014 0254   CO2 24 04/18/2014 0254   GLUCOSE 175* 04/18/2014 0254   BUN 14 04/18/2014 0254   CREATININE 1.05 04/18/2014 0254   CALCIUM 7.8* 04/18/2014 0254   GFRNONAA 54* 04/18/2014 0254   GFRAA 63* 04/18/2014 0254    INR    Component Value Date/Time   INR 0.99 04/10/2014 1037     Intake/Output Summary (Last 24 hours) at 04/20/14 0738 Last data filed at 04/19/14 2042  Gross per 24 hour  Intake    960 ml  Output    650 ml  Net    310 ml     Assessment:  69 y.o. female is s/p: Left femoral to below knee popliteal bypass with Propaten 3 Days Post-Op  Plan: -Incisions healing well. Palpable left DP pulse. Pain improving. Ambulating with walker.  -BP stable.  -PT and OT recommending home health.  -Discharge home today.    Virgina Jock, PA-C [facility] Office: [phone] Pager: [phone] 04/20/2014 7:38 AM     Agree with above 2+ DP pulse left foot Incisions healing Hold bp meds except betablocker today Blood loss anemia clincally  stable D/c home today  Ruta Hinds, MD [facility] [place] Office: [phone] Pager: [phone]

## 2014-04-22 ENCOUNTER — Encounter (HOSPITAL_COMMUNITY): Payer: Self-pay

## 2014-04-22 ENCOUNTER — Emergency Department (HOSPITAL_COMMUNITY)
Admission: EM | Admit: 2014-04-22 | Discharge: 2014-04-23 | Disposition: A | Discharge status: Home / Self Care | Payer: Medicare HMO | Attending: Emergency Medicine | Admitting: Emergency Medicine

## 2014-04-22 ENCOUNTER — Emergency Department (HOSPITAL_COMMUNITY): Payer: Medicare HMO

## 2014-04-22 DIAGNOSIS — Z85528 Personal history of other malignant neoplasm of kidney: Secondary | ICD-10-CM | POA: Insufficient documentation

## 2014-04-22 DIAGNOSIS — E119 Type 2 diabetes mellitus without complications: Secondary | ICD-10-CM | POA: Diagnosis not present

## 2014-04-22 DIAGNOSIS — I25119 Atherosclerotic heart disease of native coronary artery with unspecified angina pectoris: Secondary | ICD-10-CM | POA: Diagnosis not present

## 2014-04-22 DIAGNOSIS — K222 Esophageal obstruction: Secondary | ICD-10-CM

## 2014-04-22 DIAGNOSIS — Z9181 History of falling: Secondary | ICD-10-CM | POA: Insufficient documentation

## 2014-04-22 DIAGNOSIS — M1990 Unspecified osteoarthritis, unspecified site: Secondary | ICD-10-CM | POA: Insufficient documentation

## 2014-04-22 DIAGNOSIS — F329 Major depressive disorder, single episode, unspecified: Secondary | ICD-10-CM | POA: Diagnosis not present

## 2014-04-22 DIAGNOSIS — Y929 Unspecified place or not applicable: Secondary | ICD-10-CM | POA: Insufficient documentation

## 2014-04-22 DIAGNOSIS — Z862 Personal history of diseases of the blood and blood-forming organs and certain disorders involving the immune mechanism: Secondary | ICD-10-CM | POA: Insufficient documentation

## 2014-04-22 DIAGNOSIS — T18120A Food in esophagus causing compression of trachea, initial encounter: Secondary | ICD-10-CM | POA: Diagnosis not present

## 2014-04-22 DIAGNOSIS — E785 Hyperlipidemia, unspecified: Secondary | ICD-10-CM | POA: Insufficient documentation

## 2014-04-22 DIAGNOSIS — X58XXXA Exposure to other specified factors, initial encounter: Secondary | ICD-10-CM | POA: Insufficient documentation

## 2014-04-22 DIAGNOSIS — J441 Chronic obstructive pulmonary disease with (acute) exacerbation: Secondary | ICD-10-CM | POA: Diagnosis not present

## 2014-04-22 DIAGNOSIS — E039 Hypothyroidism, unspecified: Secondary | ICD-10-CM | POA: Diagnosis not present

## 2014-04-22 DIAGNOSIS — K219 Gastro-esophageal reflux disease without esophagitis: Secondary | ICD-10-CM | POA: Diagnosis not present

## 2014-04-22 DIAGNOSIS — Z7982 Long term (current) use of aspirin: Secondary | ICD-10-CM | POA: Insufficient documentation

## 2014-04-22 DIAGNOSIS — Z8619 Personal history of other infectious and parasitic diseases: Secondary | ICD-10-CM | POA: Insufficient documentation

## 2014-04-22 DIAGNOSIS — Z794 Long term (current) use of insulin: Secondary | ICD-10-CM | POA: Diagnosis not present

## 2014-04-22 DIAGNOSIS — F419 Anxiety disorder, unspecified: Secondary | ICD-10-CM | POA: Insufficient documentation

## 2014-04-22 DIAGNOSIS — T18128A Food in esophagus causing other injury, initial encounter: Secondary | ICD-10-CM

## 2014-04-22 DIAGNOSIS — Z79899 Other long term (current) drug therapy: Secondary | ICD-10-CM | POA: Insufficient documentation

## 2014-04-22 DIAGNOSIS — R079 Chest pain, unspecified: Secondary | ICD-10-CM | POA: Insufficient documentation

## 2014-04-22 DIAGNOSIS — Z8673 Personal history of transient ischemic attack (TIA), and cerebral infarction without residual deficits: Secondary | ICD-10-CM | POA: Diagnosis not present

## 2014-04-22 DIAGNOSIS — I10 Essential (primary) hypertension: Secondary | ICD-10-CM | POA: Insufficient documentation

## 2014-04-22 DIAGNOSIS — Z720 Tobacco use: Secondary | ICD-10-CM | POA: Diagnosis not present

## 2014-04-22 DIAGNOSIS — Z7902 Long term (current) use of antithrombotics/antiplatelets: Secondary | ICD-10-CM | POA: Insufficient documentation

## 2014-04-22 DIAGNOSIS — R112 Nausea with vomiting, unspecified: Secondary | ICD-10-CM | POA: Insufficient documentation

## 2014-04-22 DIAGNOSIS — M797 Fibromyalgia: Secondary | ICD-10-CM | POA: Insufficient documentation

## 2014-04-22 DIAGNOSIS — Y939 Activity, unspecified: Secondary | ICD-10-CM | POA: Insufficient documentation

## 2014-04-22 DIAGNOSIS — I252 Old myocardial infarction: Secondary | ICD-10-CM | POA: Insufficient documentation

## 2014-04-22 DIAGNOSIS — Y998 Other external cause status: Secondary | ICD-10-CM | POA: Insufficient documentation

## 2014-04-22 LAB — CBC
HCT: 25.3 % — ABNORMAL LOW (ref 36.0–46.0)
Hemoglobin: 8.5 g/dL — ABNORMAL LOW (ref 12.0–15.0)
MCH: 28.2 pg (ref 26.0–34.0)
MCHC: 33.6 g/dL (ref 30.0–36.0)
MCV: 84.1 fL (ref 78.0–100.0)
Platelets: 297 10*3/uL (ref 150–400)
RBC: 3.01 MIL/uL — ABNORMAL LOW (ref 3.87–5.11)
RDW: 13.4 % (ref 11.5–15.5)
WBC: 8.8 10*3/uL (ref 4.0–10.5)

## 2014-04-22 LAB — PROTIME-INR
INR: 1.06 (ref 0.00–1.49)
Prothrombin Time: 13.9 seconds (ref 11.6–15.2)

## 2014-04-22 LAB — COMPREHENSIVE METABOLIC PANEL
ALBUMIN: 2.9 g/dL — AB (ref 3.5–5.2)
ALK PHOS: 97 U/L (ref 39–117)
ALT: 12 U/L (ref 0–35)
ANION GAP: 9 (ref 5–15)
AST: 15 U/L (ref 0–37)
BUN: 11 mg/dL (ref 6–23)
CHLORIDE: 105 mmol/L (ref 96–112)
CO2: 23 mmol/L (ref 19–32)
Calcium: 8.5 mg/dL (ref 8.4–10.5)
Creatinine, Ser: 1 mg/dL (ref 0.50–1.10)
GFR, EST AFRICAN AMERICAN: 67 mL/min — AB (ref >90)
GFR, EST NON AFRICAN AMERICAN: 57 mL/min — AB (ref >90)
Glucose, Bld: 147 mg/dL — ABNORMAL HIGH (ref 70–99)
POTASSIUM: 3.5 mmol/L (ref 3.5–5.1)
Sodium: 137 mmol/L (ref 135–145)
TOTAL PROTEIN: 6.6 g/dL (ref 6.0–8.3)
Total Bilirubin: 0.2 mg/dL — ABNORMAL LOW (ref 0.3–1.2)

## 2014-04-22 LAB — BRAIN NATRIURETIC PEPTIDE: B Natriuretic Peptide: 120.8 pg/mL — ABNORMAL HIGH (ref 0.0–100.0)

## 2014-04-22 LAB — I-STAT TROPONIN, ED: Troponin i, poc: 0.01 ng/mL (ref 0.00–0.08)

## 2014-04-22 MED ORDER — GI COCKTAIL ~~LOC~~
30.0000 mL | Freq: Once | ORAL | Status: AC
  Administered 2014-04-22: 30 mL via ORAL
  Filled 2014-04-22: qty 30

## 2014-04-22 MED ORDER — GI COCKTAIL ~~LOC~~
30.0000 mL | Freq: Once | ORAL | Status: DC
  Filled 2014-04-22: qty 30

## 2014-04-22 NOTE — ED Provider Notes (Signed)
CSN: [id number]     Arrival date & time 04/22/14  2132 History   First MD Initiated Contact with Patient 04/22/14 2148     Chief Complaint  Patient presents with  . Chest Pain     HPI Patient presents to the emergency department complaining of chest pain which occurred while she was eating tonight.  She felt sudden nausea as well as shortness of breath.  She then vomited and a small amount of food came up.  She does have a history of esophageal stricture before in the past as required dilation.  She feels like food became stuck in her esophagus.  She is feeling somewhat better but then her chest pain returned and she began describing a sensation of chest pressure.  She took a nitroglycerin 3 without improvement in her symptoms and thus EMS was called.  She tried aspirin at home as well.  She had a heart catheterization in 2013 that demonstrated nonobstructive coronary artery disease.  She also underwent Myoview stress test on Feb 19th 2016 for preoperative clearance which was without inducible ischemia.  No active chest pain at this time.   Past Medical History  Diagnosis Date  . Hyperlipidemia   . Arthritis   . Hypothyroidism   . GERD (gastroesophageal reflux disease)   . Anxiety   . Diabetes mellitus age 17    insulin dependent  . Peripheral vascular disease   . Lumbar disc disease   . Insulin dependent type 2 diabetes mellitus, controlled   . GERD 07/20/2006  . CAD (coronary artery disease)   . CAD (coronary artery disease) of bypass graft   . Vertigo   . History of transient ischemic attack (TIA)   . Myocardial infarction     AGE 69    . Hypertension   . Depression   . COPD 12/14/2008    PATIENT DENIES    . Personal history of malignant neoplasm of kidney(V10.52) 12/14/2008    Laparoscopic biopsy and cryoablation 7/08 Dr. Vernie Shanks   . Headache(784.0)   . Neuromuscular disorder     PERIPHERAL NEUROPATHY  . Anemia   . Diabetic coma Feb. 2014  . Fall at home Jan. 2, 2016   Jan. 13, 2016    Left knee  . Anginal pain     1 MONTH AGO   . Stroke     3 MINI STROKES  RT SIDED WEAKNESS  . 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 Fibromyalgia   . HPV (human papilloma virus) infection    Past Surgical History  Procedure Laterality Date  . Appendectomy    . Abdominal hysterectomy    . Knee surgery    . Cholecystectomy    . Lung surgery      lung nodule removed from the right side  . Laparascopic cryoablation of left kidney  08/2006    Dr. Gladis Riffle for renal cell cancer  . Bladder suspension    . Shoulder surgery    . Foot surgery    . Pr vein bypass graft,aorto-fem-pop  05/27/2010  . Cardiac catheterization    . Femoral-popliteal bypass graft  10/12/2011    Procedure: BYPASS GRAFT FEMORAL-POPLITEAL ARTERY;  Surgeon: Elam Dutch, MD;  Location: [facility] OR;  Service: Vascular;  Laterality: Left;  Left Femoral-Popliteal Bypass Graft using 40mm x 80cm Propaten Graft with intraop arteriogram times one.  . Intraoperative arteriogram  10/12/2011    Procedure: INTRA OPERATIVE ARTERIOGRAM;  Surgeon: Jessy Oto  Fields, MD;  Location: [place];  Service: Vascular;  Laterality: Left;  . Left heart catheterization with coronary angiogram N/A 05/11/2011    Procedure: LEFT HEART CATHETERIZATION WITH CORONARY ANGIOGRAM;  Surgeon: Jolaine Artist, MD;  Location[facility] CATH LAB;  Service: Cardiovascular;  Laterality: N/A;  . Abdominal aortagram N/A 08/21/2011    Procedure: ABDOMINAL Maxcine Ham;  Surgeon: Elam Dutch, MD;  Location: [facility] CATH LAB;  Service: Cardiovascular;  Laterality: N/A;  . Abdominal aortagram N/A 03/16/2014    Procedure: ABDOMINAL Maxcine Ham;  Surgeon: Elam Dutch, MD;  Location: [facility] CATH LAB;  Service: Cardiovascular;  Laterality: N/A;  . Femoral-popliteal bypass graft Left 04/17/2014    Procedure: REDO LEFT FEMORAL-POPLITEAL ARTERY BYPASS USING GORE PROPATEN 43mmx80cm GRAFT;  Surgeon: Elam Dutch, MD;  Location: [facility] OR;  Service: Vascular;   Laterality: Left;   Family History  Problem Relation Age of Onset  . Heart disease Mother   . Heart disease Father   . Heart disease Sister   . Lung cancer Mother   . Lung cancer Father    History  Substance Use Topics  . Smoking status: Current Some Day Smoker -- 0.25 packs/day for 50 years    Types: Cigarettes    Last Attempt to Quit: 07/12/2011  . Smokeless tobacco: Never Used  . Alcohol Use: No   OB History    No data available     Review of Systems  All other systems reviewed and are negative.     Allergies  Review of patient's allergies indicates no known allergies.  Home Medications   Prior to Admission medications   Medication Sig Start Date End Date Taking? Authorizing Provider  ACCU-CHEK AVIVA PLUS test strip  01/24/14   Historical Provider, MD  Alcohol Swabs (B-D SINGLE USE SWABS REGULAR) PADS  03/21/13   Historical Provider, MD  amitriptyline (ELAVIL) 75 MG tablet Take 75 mg by mouth at bedtime.    Historical Provider, MD  amLODipine (NORVASC) 10 MG tablet Take 10 mg by mouth daily.  08/22/13   Historical Provider, MD  aspirin 81 MG tablet Take 81 mg by mouth daily.    Historical Provider, MD  cloNIDine (CATAPRES) 0.3 MG tablet Take 0.3 mg by mouth daily.  08/22/13   Historical Provider, MD  clopidogrel (PLAVIX) 75 MG tablet Take 75 mg by mouth daily.     Historical Provider, MD  cyclobenzaprine (FLEXERIL) 10 MG tablet Take 10 mg by mouth at bedtime as needed for muscle spasms.  03/13/13   Historical Provider, MD  diphenhydrAMINE (BENADRYL) 25 MG tablet Take 25 mg by mouth every 6 (six) hours as needed for itching or allergies.    Historical Provider, MD  DULoxetine (CYMBALTA) 30 MG capsule Take 30 mg by mouth at bedtime.    Historical Provider, MD  hydrALAZINE (APRESOLINE) 25 MG tablet Take 25 mg by mouth 2 (two) times daily.  08/22/13   Historical Provider, MD  hydrochlorothiazide (HYDRODIURIL) 25 MG tablet Take 25 mg by mouth daily.  07/10/13   Historical  Provider, MD  HYDROcodone-acetaminophen (NORCO) 10-325 MG per tablet Take 1 tablet by mouth every 4 (four) hours as needed (pain). 04/20/14   Alvia Grove, PA-C  insulin aspart (NOVOLOG) 100 UNIT/ML injection Inject 5-10 Units into the skin 3 (three) times daily as needed for high blood sugar (cbg over 200). Per sliding scale    Historical Provider, MD  insulin glargine (LANTUS) 100 UNIT/ML injection Inject 80 Units into the skin at bedtime.  Dispense syringes and needles in needed, dispense Lantus pen if approved by insurance. 09/23/12   Thurnell Lose, MD  lansoprazole (PREVACID) 30 MG capsule Take 30 mg by mouth daily.     Historical Provider, MD  levothyroxine (SYNTHROID, LEVOTHROID) 125 MCG tablet Take 125 mcg by mouth daily before breakfast.    Historical Provider, MD  lisinopril (PRINIVIL,ZESTRIL) 5 MG tablet Take 5 mg by mouth daily.    Historical Provider, MD  LORazepam (ATIVAN) 1 MG tablet Take 3 mg by mouth at bedtime.    Historical Provider, MD  methocarbamol (ROBAXIN) 500 MG tablet Take 1 tablet (500 mg total) by mouth 2 (two) times daily as needed for muscle spasms. 02/07/14   Johnna Acosta, MD  metoprolol succinate (TOPROL-XL) 25 MG 24 hr tablet Take 25 mg by mouth daily. 03/26/14   Historical Provider, MD  nortriptyline (PAMELOR) 75 MG capsule Take 75 mg by mouth at bedtime. 01/24/14   Historical Provider, MD  pravastatin (PRAVACHOL) 40 MG tablet Take 40 mg by mouth daily. 03/26/14   Historical Provider, MD  promethazine (PHENERGAN) 25 MG suppository Place 1 suppository (25 mg total) rectally every 6 (six) hours as needed for nausea or vomiting. 09/09/13   Babette Relic, MD  tolterodine (DETROL LA) 4 MG 24 hr capsule Take 4 mg by mouth daily.     Historical Provider, MD   BP 106/54 mmHg  Pulse 81  Temp(Src) 98.2 F (36.8 C) (Oral)  Resp 18  Ht 5\' 1"  (1.549 m)  Wt 164 lb (74.39 kg)  BMI 31.00 kg/m2  SpO2 97% Physical Exam  Constitutional: She is oriented to person, place, and  time. She appears well-developed and well-nourished. No distress.  HENT:  Head: Normocephalic and atraumatic.  Eyes: EOM are normal.  Neck: Normal range of motion.  Cardiovascular: Normal rate, regular rhythm and normal heart sounds.   Pulmonary/Chest: Effort normal and breath sounds normal.  Abdominal: Soft. She exhibits no distension. There is no tenderness.  Musculoskeletal: Normal range of motion.  Neurological: She is alert and oriented to person, place, and time.  Skin: Skin is warm and dry.  Psychiatric: She has a normal mood and affect. Judgment normal.  Nursing note and vitals reviewed.   ED Course  Procedures (including critical care time) Labs Review Labs Reviewed  COMPREHENSIVE METABOLIC PANEL - Abnormal; Notable for the following:    Glucose, Bld 147 (*)    Albumin 2.9 (*)    Total Bilirubin 0.2 (*)    GFR calc non Af Amer 57 (*)    GFR calc Af Amer 67 (*)    All other components within normal limits  CBC - Abnormal; Notable for the following:    RBC 3.01 (*)    Hemoglobin 8.5 (*)    HCT 25.3 (*)    All other components within normal limits  BRAIN NATRIURETIC PEPTIDE - Abnormal; Notable for the following:    B Natriuretic Peptide 120.8 (*)    All other components within normal limits  PROTIME-INR  URINALYSIS, ROUTINE W REFLEX MICROSCOPIC  I-STAT TROPOININ, ED   HEMOGLOBIN  Date Value Ref Range Status  04/22/2014 8.5* 12.0 - 15.0 g/dL Final  04/18/2014 8.6* 12.0 - 15.0 g/dL Final  04/17/2014 9.9* 12.0 - 15.0 g/dL Final  04/10/2014 12.5 12.0 - 15.0 g/dL Final      Imaging Review Dg Chest 2 View  04/22/2014   CLINICAL DATA:  Chest pain  EXAM: CHEST  2 VIEW  COMPARISON:  09/21/2012  FINDINGS: Bronchitic markings with focal scarring in the right perihilar region. A thin linear opacity in the retrocardiac lung favors mild atelectasis. No edema or definitive pneumonia. No effusion or pneumothorax. There is normal heart size and aortic contours for technique.  Cholecystectomy changes.  IMPRESSION: Bronchitic changes without edema or pneumonia.   Electronically Signed   By: Monte Fantasia M.D.   On: 04/22/2014 22:40  I personally reviewed the imaging tests through PACS system I reviewed available ER/hospitalization records through the EMR    EKG Interpretation   Date/Time:  Sunday April 22 2014 21:48:05 EST Ventricular Rate:  76 PR Interval:  188 QRS Duration: 98 QT Interval:  393 QTC Calculation: 442 R Axis:   91 Text Interpretation:  Sinus rhythm Atrial premature complex Right axis  deviation nonspecific lateral t wave changes as compared to prior  Confirmed by Savannaha Stonerock  MD, [place] Trawick ([id number]) on 04/22/2014 10:58:18 PM      MDM   Final diagnoses:  Esophageal obstruction due to food impaction  Chest pain, unspecified chest pain type    Suspect esophageal spasm after esophageal impaction.  EKG without ischemic changes.  Reassuring Myoview stress tests in the past 30 days.  Doubt ACS.  Doubt PE.  Likely outpatient GI follow-up for endoscopy with clear liquids until follow-up. Baseline anemia for the patient  12:09 AM Patient feels better at this time.  Discharged home in good condition.  She now reports to me that she also had a bologna sandwich become stuck in her soft against the night before.  Tonight she was trying to use and which again.  I've asked that she moved to thin liquids only until she follows up with the gastroenterology team.  Hoy Morn, MD 04/23/14 [phone]

## 2014-04-22 NOTE — ED Notes (Signed)
Patient transported to X-ray 

## 2014-04-22 NOTE — ED Notes (Signed)
Per EMS, Patient has new onset of central chest pain with SOB described as pressure and sharp pain. Patient rated pain 10/10. Patient has history of chest pain and took three expired nitro with no relief. Patient was given one of Nitro with EMS that decreased pain 7/10 and complained of some nausea that resolved. Patient had procedure done on Tuesday that had clots removed on left leg with popliteal and femoral sites. Patient is alert and oriented x4 upon arrival. Patient is speaking in full sentences. Vitals per EMS: 95/52, 83 HR, 98% on RA. 22 L Wrist. 324 ASA, 4 mg of Zofran.

## 2014-04-23 ENCOUNTER — Telehealth: Payer: Self-pay | Admitting: Vascular Surgery

## 2014-04-23 ENCOUNTER — Ambulatory Visit: Payer: Self-pay | Admitting: Neurology

## 2014-04-23 NOTE — Discharge Instructions (Signed)
Esophageal Stricture °The esophagus is the long, narrow tube which carries food and liquid from the mouth to the stomach. Sometimes a part of the esophagus becomes narrow and makes it difficult, painful, or even impossible to swallow. This is called an esophageal stricture.  °CAUSES  °Common causes of blockage or strictures of the esophagus are: °· Exposure of the lower esophagus to the acid from the stomach may cause narrowing. °· Hiatal hernia in which a small part of the stomach bulges up through the diaphragm can cause a narrowing in the bottom of the esophagus. °· Scleroderma is a tissue disorder that affects the esophagus and makes swallowing difficult. °· Achalasia is an absence of nerves in the lower esophagus and to the esophageal sphincter. This absence of nerves may be congenital (present since birth). This can cause irregular spasms which do not allow food and fluid through. °· Strictures may develop from swallowing materials which damage the esophagus. Examples are acids or alkalis such as lye. °· Schatzki's Ring is a narrow ring of non-cancerous tissue which narrows the lower esophagus. The cause of this is unknown. °· Growths can block the esophagus. °SYMPTOMS  °Some of the problems are difficulty swallowing or pain with swallowing. °DIAGNOSIS  °Your caregiver often suspects this problem by taking a medical history. They will also do a physical exam. They may then take X-rays and/or perform an endoscopy. Endoscopy is an exam in which a tube like a small flexible telescope is used to look at your esophagus.  °TREATMENT °· One form of treatment is to dilate the narrow area. This means to stretch it. °· When this is not successful, chest surgery may be required. This is a much more extensive form of treatment with a longer recovery time. °Both of the above treatments make the passage of food and water into the stomach easier. They also make it easier for stomach contents to bubble back into the  esophagus. Special medications may be used following the procedure to help prevent further narrowing. Medications may be used to lower the amount of acid in the stomach juice.  °SEEK IMMEDIATE MEDICAL CARE IF:  °· Your swallowing is becoming more painful, difficult, or you are unable to swallow. °· You vomit up blood. °· You develop black tarry stools. °· You develop chills. °· You have a fever. °· You develop chest or abdominal pain. °· You develop shortness of breath, feel lightheaded, or faint. °Follow up with medical care as your caregiver suggests. °Document Released: 10/20/2005 Document Revised: 05/04/2011 Document Reviewed: 06/21/2013 °ExitCare® Patient Information ©2015 [employer]. This information is not intended to replace advice given to you by your health care provider. Make sure you discuss any questions you have with your health care provider. ° °

## 2014-04-23 NOTE — ED Notes (Signed)
MD at bedside. 

## 2014-04-23 NOTE — Telephone Encounter (Signed)
-----   Message from Mena Goes, RN sent at 04/20/2014  9:57 AM EST ----- Regarding: Schedule   ----- Message -----    From: Alvia Grove, PA-C    Sent: 04/20/2014   7:44 AM      To: [facility] Charge Pool  S/p leg fem-pop w/propaten 04/17/14  F/u with Dr. Oneida Alar in 2 weeks.  Thanks Maudie Mercury

## 2014-04-23 NOTE — Discharge Summary (Signed)
[facility] Discharge Summary  April Branch [birth year] 69 y.o. female  [id number]  Admission Date: 04/17/2014  Discharge Date: 04/20/14  Physician: No att. providers found  Admission Diagnosis: Occluded left femoral to popliteal bypass graft T82.392   HPI:   This is a 69 y.o. female who is status post left femoropopliteal bypass graft with propaten on 10/12/2011.  She also has a history of carotid artery stenosis.   She return to the [facility] office on 04/17/14 with c/o worsening left calf claudication since she fell about a month ago on her left knee, she did not feel that she injured herself at that time and was not medically evaluated, denies non healing wounds. She has severe vertigo.  States she had brain imaging about 2010 which demonstrated 3 TIA'S, has intermittent dizziness, has tingling and numbness in both hands, weakness in both hands, moreso in left.  Carotid Duplex on file result was from 8/5/20013, showed 60-79% right ICA stenosis and 40-59% left ICA stenosis, requested by Dr. Burt Knack, pt does not recall this.  March, 2015 carotid Duplex demonstrated <40% stenosis in both internal carotid arteries.  Her DM is better controlled than at her September 2015 visit and she continues to smoke.  She has known lumbar spine issues with radiculopathy in both legs.  She gets choked easily, has to drink through a straw.  She has dry cracked fissures on both heals, no infection, no drainage, no other non healing wounds., this is improving.   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  She has intermittent dizziness, has tingling and numbness in both hands, weakness in both hands, moreso in left.  She had severe headaches several years ago which prompted brain imaging, states was found she had 3 ministrokes, she does not recall any TIA symptoms other than the headache, denies lateralizing symptoms.   She was hospitalized recently  at [facility] for dehydration, does not know how she got dehydrated.  She denies cough or dyspnea, states her sinuses remain "stopped up", she is currently taking an antibx for a sinus infection, prescribed by her PCP, per pt.   Hospital Course:  The patient was admitted to the hospital and taken to the operating room on 04/17/2014 and underwent: left femoral to below knee popliteal bypass with propaten.   The patient tolerated the procedure well and was transported to the PACU in  stable condition.   She was doing well on POD 1. Her incisions were healing well and she had a palpable left DP pulse. She was transferred to the floor. Her blood loss anemia was stable. She continued to do well on POD 2 and was discharged home on POD 3 in good condition. She was ambulating well with a walker and pain adequately controlled.     CBC    Component Value Date/Time   WBC 8.8 04/22/2014 2233   RBC 3.01* 04/22/2014 2233   HGB 8.5* 04/22/2014 2233   HCT 25.3* 04/22/2014 2233   PLT 297 04/22/2014 2233   MCV 84.1 04/22/2014 2233   MCH 28.2 04/22/2014 2233   MCHC 33.6 04/22/2014 2233   RDW 13.4 04/22/2014 2233   LYMPHSABS 1.7 09/09/2013 1229   MONOABS 0.4 09/09/2013 1229   EOSABS 0.2 09/09/2013 1229   BASOSABS 0.0 09/09/2013 1229    BMET    Component Value Date/Time   NA 137 04/22/2014 2233   K 3.5 04/22/2014 2233   CL 105 04/22/2014 2233  CO2 23 04/22/2014 2233   GLUCOSE 147* 04/22/2014 2233   BUN 11 04/22/2014 2233   CREATININE 1.00 04/22/2014 2233   CALCIUM 8.5 04/22/2014 2233   GFRNONAA 57* 04/22/2014 2233   GFRAA 67* 04/22/2014 2233     Discharge Instructions:   The patient is discharged to home with extensive instructions on wound care and progressive ambulation.  They are instructed not to drive or perform any heavy lifting until returning to see the physician in his office.  Discharge Instructions    Call MD for:  redness, tenderness, or signs of infection (pain, swelling, bleeding,  redness, odor or green/yellow discharge around incision site)    Complete by:  As directed      Call MD for:  severe or increased pain, loss or decreased feeling  in affected limb(s)    Complete by:  As directed      Call MD for:  temperature >100.5    Complete by:  As directed      Discharge wound care:    Complete by:  As directed   Wash the groin wound with soap and water daily and pat dry. (No tub bath-only shower)  Then put a dry gauze or washcloth there to keep this area dry daily and as needed.  Do not use Vaseline or neosporin on your incisions.  Only use soap and water on your incisions and then protect and keep dry.  Wash lower leg incision daily with soap and water and pat dry.     Driving Restrictions    Complete by:  As directed   No driving for 2 weeks     Increase activity slowly    Complete by:  As directed   Walk with assistance use walker or cane as needed     Lifting restrictions    Complete by:  As directed   No lifting greater than gallon of milk for 2 weeks     Resume previous diet    Complete by:  As directed            Discharge Diagnosis:  Occluded left femoral to popliteal bypass graft T82.392  Secondary Diagnosis: Patient Active Problem List   Diagnosis Date Noted  . 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 PAD (peripheral artery disease) 03/16/2014  . Occlusion and stenosis of carotid artery without mention of cerebral infarction 05/19/2013  . Chest pain 09/21/2012  . Nausea vomiting and diarrhea 09/21/2012  . Acute kidney injury (nontraumatic) 09/21/2012  . Chronic pain syndrome 03/13/2012  . Acute kidney injury 03/13/2012  . Encephalopathy, toxic 03/12/2012  . Pain in limb 10/22/2011  . Atherosclerosis of native arteries of the extremities with intermittent claudication 10/08/2011  . Peripheral vascular disease, unspecified 07/30/2011  . CAD (coronary artery disease) 05/10/2011  . Hyperlipidemia     . Unstable angina 05/09/2011  . Peripheral vascular disease 12/19/2008  . CERVICAL CANCER 12/14/2008  . Personal history of malignant neoplasm of kidney 12/14/2008  . MI 12/14/2008  . COPD 12/14/2008  . IBS 12/14/2008  . Hypertension   . Personal history of colonic polyps 08/13/2008  . DISORDER, TOBACCO USE 08/04/2006  . Hypothyroidism   . Insulin dependent type 2 diabetes mellitus, controlled   . Lumbar disc disease   . ANXIETY STATE NOS 07/20/2006  . GERD 07/20/2006  . HX, PERSONAL, VENOUS THROMBOSIS/EMBOLISM 07/20/2006   Past Medical History  Diagnosis Date  . Hyperlipidemia   . Arthritis   .  Hypothyroidism   . GERD (gastroesophageal reflux disease)   . Anxiety   . Diabetes mellitus age 60    insulin dependent  . Peripheral vascular disease   . Lumbar disc disease   . Insulin dependent type 2 diabetes mellitus, controlled   . GERD 07/20/2006  . CAD (coronary artery disease)   . CAD (coronary artery disease) of bypass graft   . Vertigo   . History of transient ischemic attack (TIA)   . Myocardial infarction     AGE 15    . Hypertension   . Depression   . COPD 12/14/2008    PATIENT DENIES    . Personal history of malignant neoplasm of kidney(V10.52) 12/14/2008    Laparoscopic biopsy and cryoablation 7/08 Dr. Vernie Shanks   . Headache(784.0)   . Neuromuscular disorder     PERIPHERAL NEUROPATHY  . Anemia   . Diabetic coma Feb. 2014  . Fall at home Jan. 2, 2016  Jan. 13, 2016    Left knee  . Anginal pain     1 MONTH AGO   . Stroke     3 MINI STROKES  RT SIDED WEAKNESS  . 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 Fibromyalgia   . HPV (human papilloma virus) infection        Medication List    STOP taking these medications        clopidogrel 75 MG tablet  Commonly known as:  PLAVIX      TAKE these medications        ACCU-CHEK AVIVA PLUS test strip  Generic drug:  glucose blood     amitriptyline 75 MG tablet  Commonly known as:  ELAVIL   Take 75 mg by mouth at bedtime.     amLODipine 10 MG tablet  Commonly known as:  NORVASC  Take 10 mg by mouth daily.     aspirin 81 MG tablet  Take 81 mg by mouth daily.     B-D SINGLE USE SWABS REGULAR Pads     cloNIDine 0.3 MG tablet  Commonly known as:  CATAPRES  Take 0.3 mg by mouth daily.     cyclobenzaprine 10 MG tablet  Commonly known as:  FLEXERIL  Take 10 mg by mouth at bedtime as needed for muscle spasms.     diphenhydrAMINE 25 MG tablet  Commonly known as:  BENADRYL  Take 25 mg by mouth every 6 (six) hours as needed for itching or allergies.     DULoxetine 30 MG capsule  Commonly known as:  CYMBALTA  Take 30 mg by mouth at bedtime.     hydrALAZINE 25 MG tablet  Commonly known as:  APRESOLINE  Take 25 mg by mouth 2 (two) times daily.     hydrochlorothiazide 25 MG tablet  Commonly known as:  HYDRODIURIL  Take 25 mg by mouth daily.     HYDROcodone-acetaminophen 10-325 MG per tablet  Commonly known as:  NORCO  Take 1 tablet by mouth every 4 (four) hours as needed (pain).     insulin aspart 100 UNIT/ML injection  Commonly known as:  novoLOG  Inject 5-10 Units into the skin 3 (three) times daily as needed for high blood sugar (cbg over 200). Per sliding scale     insulin glargine 100 UNIT/ML injection  Commonly known as:  LANTUS  Inject 80 Units into the skin at bedtime. Dispense syringes and needles in needed, dispense Lantus pen if approved by insurance.  lansoprazole 30 MG capsule  Commonly known as:  PREVACID  Take 30 mg by mouth daily.     levothyroxine 125 MCG tablet  Commonly known as:  SYNTHROID, LEVOTHROID  Take 125 mcg by mouth daily before breakfast.     lisinopril 5 MG tablet  Commonly known as:  PRINIVIL,ZESTRIL  Take 5 mg by mouth daily.     LORazepam 1 MG tablet  Commonly known as:  ATIVAN  Take 3 mg by mouth at bedtime.     methocarbamol 500 MG tablet  Commonly known as:  ROBAXIN  Take 1 tablet (500 mg total) by mouth 2 (two)  times daily as needed for muscle spasms.     metoprolol succinate 25 MG 24 hr tablet  Commonly known as:  TOPROL-XL  Take 25 mg by mouth daily.     nortriptyline 75 MG capsule  Commonly known as:  PAMELOR  Take 75 mg by mouth at bedtime.     pravastatin 40 MG tablet  Commonly known as:  PRAVACHOL  Take 40 mg by mouth daily.     promethazine 25 MG suppository  Commonly known as:  PHENERGAN  Place 1 suppository (25 mg total) rectally every 6 (six) hours as needed for nausea or vomiting.     tolterodine 4 MG 24 hr capsule  Commonly known as:  DETROL LA  Take 4 mg by mouth daily.        Norco #30 No Refill  Disposition: Home  Patient's condition: is Good  Follow up: 1. Dr. Oneida Alar in 2 weeks   Virgina Jock, PA-C [facility] [phone] 04/23/2014  11:29 AM  - For VQI Registry use --- Instructions: Press F2 to tab through selections.  Delete question if not applicable.   Post-op:  Wound infection: No  Graft infection: No  Transfusion: No   New Arrhythmia: No Ipsilateral amputation: No, [ ]  Minor, [ ]  BKA, [ ]  AKA Discharge patency: [x ] Primary, [ ]  Primary assisted, [ ]  Secondary, [ ]  Occluded Patency judged by: [ ]  Dopper only, [ ]  Palpable graft pulse, [x ] Palpable distal pulse, [ ]  ABI inc. > 0.15, [ ]  Duplex Discharge ABI: R 0.51, L 0.83 D/C Ambulatory Status: Ambulatory with Assistance  Complications: MI: No, [ ]  Troponin only, [ ]  EKG or Clinical CHF: No Resp failure:No, [ ]  Pneumonia, [ ]  Ventilator Chg in renal function: No, [ ]  Inc. Cr > 0.5, [ ]  Temp. Dialysis, [ ]  Permanent dialysis Stroke: No, [ ]  Minor, [ ]  Major Return to OR: No  Reason for return to OR: [ ]  Bleeding, [ ]  Infection, [ ]  Thrombosis, [ ]  Revision  Discharge medications: Statin use:  yes ASA use:  yes Plavix use:  yes Beta blocker use: yes Coumadin use: no

## 2014-04-23 NOTE — Telephone Encounter (Signed)
No answer, no vm- mailed letter, dpm

## 2014-04-24 ENCOUNTER — Encounter: Payer: Self-pay | Admitting: *Deleted

## 2014-04-25 ENCOUNTER — Telehealth: Payer: Self-pay

## 2014-04-25 ENCOUNTER — Telehealth: Payer: Self-pay | Admitting: Neurology

## 2014-04-25 NOTE — Telephone Encounter (Signed)
rec'd call from [place] with [facility].  Reported pt. Wouldn't allow Physical Therapy in her home today.  Pt. reported she thought she had the flu; reported nausea, vomiting, diarrhea and fever of 102 degrees.  Phone call to the pt.  Questioned her about present symptoms.  Reported her temp. Is 101 degrees, continued diarrhea.  Stated she felt the vomiting is due to her Esophageal stricture.  Reported she has an appt. With her PCP in the AM.  Stated she has someone at home with her.  Encouraged her to stay hydrated.  Stated she is able to drink and keep fluids down.  Questioned about her incision.  Reported the incision looks good.  Will f/u with pt. following her PCP appt. In the AM.

## 2014-04-25 NOTE — Telephone Encounter (Signed)
Pt no showed NP appt w/ Dr. Delice Lesch on  04/23/14. No show letter + policy mailed to pt.  Seth Bake - please notify Dr. Sheryle Hail of no showed appt / Sherri S.

## 2014-04-25 NOTE — Telephone Encounter (Signed)
Spoke w/ Larene Beach from Dr. Ralene Cork office and notified her.

## 2014-04-30 NOTE — Telephone Encounter (Signed)
Call placed to pt. To follow-up on her symptoms.  Stated she is "doing much better."  Denied fever, nausea, vomiting, or diarrhea at this time.  Stated she had a virus, and also has an esophageal stricture.  Was evaluated by her PCP.  Stated she is walking and going up and down steps, and feels she is doing well.  Stated her incision is doing well.  Has a f/u appt. this week with Dr. Oneida Alar on 3/10.

## 2014-05-01 ENCOUNTER — Ambulatory Visit: Payer: Self-pay | Admitting: Physician Assistant

## 2014-05-02 ENCOUNTER — Encounter: Payer: Self-pay | Admitting: *Deleted

## 2014-05-02 ENCOUNTER — Encounter: Payer: Self-pay | Admitting: Vascular Surgery

## 2014-05-03 ENCOUNTER — Encounter: Payer: Self-pay | Admitting: Vascular Surgery

## 2014-05-08 ENCOUNTER — Encounter: Payer: Self-pay | Admitting: Vascular Surgery

## 2014-05-08 ENCOUNTER — Encounter: Payer: Self-pay | Admitting: *Deleted

## 2014-05-09 ENCOUNTER — Ambulatory Visit (INDEPENDENT_AMBULATORY_CARE_PROVIDER_SITE_OTHER): Payer: Medicare HMO | Admitting: Physician Assistant

## 2014-05-09 ENCOUNTER — Ambulatory Visit (INDEPENDENT_AMBULATORY_CARE_PROVIDER_SITE_OTHER): Payer: Self-pay | Admitting: Vascular Surgery

## 2014-05-09 ENCOUNTER — Encounter: Payer: Self-pay | Admitting: Physician Assistant

## 2014-05-09 ENCOUNTER — Encounter: Payer: Self-pay | Admitting: Vascular Surgery

## 2014-05-09 ENCOUNTER — Telehealth: Payer: Self-pay | Admitting: *Deleted

## 2014-05-09 VITALS — BP 118/63 | HR 96 | Resp 16 | Ht 61.0 in | Wt 150.0 lb

## 2014-05-09 VITALS — BP 105/58 | HR 86 | Ht 61.0 in | Wt 159.6 lb

## 2014-05-09 DIAGNOSIS — Z794 Long term (current) use of insulin: Secondary | ICD-10-CM

## 2014-05-09 DIAGNOSIS — E119 Type 2 diabetes mellitus without complications: Secondary | ICD-10-CM

## 2014-05-09 DIAGNOSIS — Z7901 Long term (current) use of anticoagulants: Secondary | ICD-10-CM

## 2014-05-09 DIAGNOSIS — IMO0001 Reserved for inherently not codable concepts without codable children: Secondary | ICD-10-CM

## 2014-05-09 DIAGNOSIS — I739 Peripheral vascular disease, unspecified: Secondary | ICD-10-CM

## 2014-05-09 DIAGNOSIS — R1314 Dysphagia, pharyngoesophageal phase: Secondary | ICD-10-CM

## 2014-05-09 NOTE — Patient Instructions (Signed)
We will call you once we hear back from Dr. Johnsie Cancel.  You have been scheduled for an endoscopy. Please follow written instructions given to you at your visit today. If you use inhalers (even only as needed), please bring them with you on the day of your procedure. Your physician has requested that you go to www.startemmi.com and enter the access code given to you at your visit today. This web site gives a general overview about your procedure. However, you should still follow specific instructions given to you by our office regarding your preparation for the procedure.

## 2014-05-09 NOTE — Progress Notes (Signed)
Patient is a 69 year old female who returns for postoperative follow-up today. She underwent left femoral to below-knee popliteal bypass with propaten on 04/17/2014. She had previously undergone a femoral to below-knee popliteal bypass in 2013. This was done for claudication symptoms. She has not smoked now for approximately 3 weeks. She states her claudication symptoms have completely resolved. She is undergoing a workup for some dysphagia symptoms. She is on aspirin and Plavix.  Past Medical History  Diagnosis Date  . Hyperlipidemia   . Arthritis   . Hypothyroidism   . GERD (gastroesophageal reflux disease)   . Anxiety   . Diabetes mellitus age 76    insulin dependent  . Peripheral vascular disease   . Lumbar disc disease   . Insulin dependent type 2 diabetes mellitus, controlled   . GERD 07/20/2006  . CAD (coronary artery disease)   . CAD (coronary artery disease) of bypass graft   . Vertigo   . History of transient ischemic attack (TIA)   . Myocardial infarction     AGE 89    . Hypertension   . Depression   . COPD 12/14/2008    PATIENT DENIES    . Personal history of malignant neoplasm of kidney(V10.52) 12/14/2008    Laparoscopic biopsy and cryoablation 7/08 Dr. Vernie Shanks   . Headache(784.0)   . Neuromuscular disorder     PERIPHERAL NEUROPATHY  . Anemia   . Diabetic coma Feb. 2014  . Fall at home Jan. 2, 2016  Jan. 13, 2016    Left knee  . Anginal pain     1 MONTH AGO   . Stroke     3 MINI STROKES  RT SIDED WEAKNESS  . 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 Fibromyalgia   . HPV (human papilloma virus) infection   . Colon polyps 09/11/2010    Tubular adenoma   Past Surgical History  Procedure Laterality Date  . Appendectomy    . Abdominal hysterectomy    . Knee surgery    . Cholecystectomy    . Lung surgery      lung nodule removed from the right side  . Laparascopic cryoablation of left kidney  08/2006    Dr. Gladis Riffle for renal cell cancer  .  Bladder suspension    . Shoulder surgery    . Foot surgery    . Pr vein bypass graft,aorto-fem-pop  05/27/2010  . Cardiac catheterization    . Femoral-popliteal bypass graft  10/12/2011    Procedure: BYPASS GRAFT FEMORAL-POPLITEAL ARTERY;  Surgeon: Elam Dutch, MD;  Location: [facility] OR;  Service: Vascular;  Laterality: Left;  Left Femoral-Popliteal Bypass Graft using 33mm x 80cm Propaten Graft with intraop arteriogram times one.  . Intraoperative arteriogram  10/12/2011    Procedure: INTRA OPERATIVE ARTERIOGRAM;  Surgeon: Elam Dutch, MD;  Location: [place];  Service: Vascular;  Laterality: Left;  . Left heart catheterization with coronary angiogram N/A 05/11/2011    Procedure: LEFT HEART CATHETERIZATION WITH CORONARY ANGIOGRAM;  Surgeon: Jolaine Artist, MD;  Location: [facility] CATH LAB;  Service: Cardiovascular;  Laterality: N/A;  . Abdominal aortagram N/A 08/21/2011    Procedure: ABDOMINAL Maxcine Ham;  Surgeon: Elam Dutch, MD;  Location: [facility] CATH LAB;  Service: Cardiovascular;  Laterality: N/A;  . Abdominal aortagram N/A 03/16/2014    Procedure: ABDOMINAL Maxcine Ham;  Surgeon: Elam Dutch, MD;  Location: [facility] CATH LAB;  Service: Cardiovascular;  Laterality: N/A;  . Femoral-popliteal bypass graft Left  04/17/2014    Procedure: REDO LEFT FEMORAL-POPLITEAL ARTERY BYPASS USING GORE PROPATEN 17mmx80cm GRAFT;  Surgeon: Elam Dutch, MD;  Location: [place];  Service: Vascular;  Laterality: Left;   Current Outpatient Prescriptions on File Prior to Visit  Medication Sig Dispense Refill  . ACCU-CHEK AVIVA PLUS test strip     . Alcohol Swabs (B-D SINGLE USE SWABS REGULAR) PADS     . amitriptyline (ELAVIL) 75 MG tablet Take 75 mg by mouth at bedtime.    Marland Kitchen amLODipine (NORVASC) 10 MG tablet Take 10 mg by mouth daily.     Marland Kitchen aspirin 81 MG tablet Take 81 mg by mouth daily.    . cloNIDine (CATAPRES) 0.3 MG tablet Take 0.3 mg by mouth daily.     . clopidogrel (PLAVIX) 75 MG tablet Take 75 mg by mouth daily.      . cyclobenzaprine (FLEXERIL) 10 MG tablet Take 10 mg by mouth at bedtime as needed for muscle spasms.     . diphenhydrAMINE (BENADRYL) 25 MG tablet Take 25 mg by mouth every 6 (six) hours as needed for itching or allergies.    . DULoxetine (CYMBALTA) 30 MG capsule Take 30 mg by mouth at bedtime.    . hydrALAZINE (APRESOLINE) 25 MG tablet Take 25 mg by mouth 2 (two) times daily.     . hydrochlorothiazide (HYDRODIURIL) 25 MG tablet Take 25 mg by mouth daily.     Marland Kitchen HYDROcodone-acetaminophen (NORCO) 10-325 MG per tablet Take 1 tablet by mouth every 4 (four) hours as needed (pain). 30 tablet 0  . insulin aspart (NOVOLOG) 100 UNIT/ML injection Inject 5-10 Units into the skin 3 (three) times daily as needed for high blood sugar (cbg over 200). Per sliding scale    . insulin glargine (LANTUS) 100 UNIT/ML injection Inject 80 Units into the skin at bedtime. Dispense syringes and needles in needed, dispense Lantus pen if approved by insurance.    . lansoprazole (PREVACID) 30 MG capsule Take 30 mg by mouth daily.     Marland Kitchen levothyroxine (SYNTHROID, LEVOTHROID) 125 MCG tablet Take 125 mcg by mouth daily before breakfast.    . lisinopril (PRINIVIL,ZESTRIL) 5 MG tablet Take 5 mg by mouth daily.    Marland Kitchen LORazepam (ATIVAN) 1 MG tablet Take 3 mg by mouth at bedtime.    . methocarbamol (ROBAXIN) 500 MG tablet Take 1 tablet (500 mg total) by mouth 2 (two) times daily as needed for muscle spasms. 20 tablet 0  . metoprolol succinate (TOPROL-XL) 25 MG 24 hr tablet Take 25 mg by mouth daily.    . minoxidil (LONITEN) 2.5 MG tablet Take 2.5 mg by mouth daily.     . nortriptyline (PAMELOR) 75 MG capsule Take 75 mg by mouth at bedtime.    . pravastatin (PRAVACHOL) 40 MG tablet Take 40 mg by mouth daily.    . promethazine (PHENERGAN) 25 MG suppository Place 1 suppository (25 mg total) rectally every 6 (six) hours as needed for nausea or vomiting. 2 suppository 0  . tolterodine (DETROL LA) 4 MG 24 hr capsule Take 4 mg by mouth  daily.     . [DISCONTINUED] Gabapentin (NEURONTIN PO) Take by mouth. New medicine, pt doesn't remember dosage     No current facility-administered medications on file prior to visit.   Physcal exam:  Filed Vitals:   05/09/14 1344  BP: 118/63  Pulse: 96  Resp: 16  Height: 5\' 1"  (1.549 m)  Weight: 150 lb (68.04 kg)    Extremities: Left  groin incision well-healed below-knee incision healed 2+ left dorsalis pedis pulse, healing ulcer dorsum of left fourth toe, right leg no palpable pedal pulses no ulcers pink warm and well-perfused  Assessment: Doing well status post left femoropopliteal bypass. Has not been smoking for 3 weeks.  Plan: Follow-up in 3 months with our nurse practitioner for graft duplex and ABIs.  Ruta Hinds, MD [facility] [place] Office: [phone] Pager: [phone]

## 2014-05-09 NOTE — Telephone Encounter (Signed)
FORWARDED TO DR [place]

## 2014-05-09 NOTE — Progress Notes (Signed)
Patient ID: April Branch, female   DOB: 08-16-1945, 69 y.o.   MRN: [id number]   Subjective:    Patient ID: April Branch, female    DOB: 1945-07-23, 69 y.o.   MRN: [id number]  HPI April Branch is a pleasant 69 year old white female known remotely to Dr. Ardis Hughs. She comes in today with complaints of solid food dysphagia over the past month. Patient had undergone EGD during a hospitalization in 2012 for DKA. She was found to have grade D esophagitis, no evidence of stricture. Later that year while hospitalized again with abnormal CT scan and right-sided colitis she had undergone colonoscopy with Dr. Olevia Perches which showed severe diverticulosis in the sigmoid colon 2 small adenomatous polyps were removed and there was no evidence of right-sided colitis. Patient states that she had been having intermittent difficulty with solid food dysphagia since early February. She had an ER visit on February 28 with a food impaction. She said she had eaten a bologna sandwich vomited back up and then the following morning 8 another bologna sandwich which became lodged. She had chest pain with this and says "she thought she was having a heart attack". He rarely the food impaction resolved with a GI cocktail and glucagon. She says since then she is having very frequent symptoms of solid food dysphagia. She says is uncomfortable when the food hangs up generally she waits long enough it will go on through. A few days ago she had eaten a sausage and eggs and then needed ate  a piece of bread which became lodged. She tried to regurgitate unsuccessfully and then she says about 10 hours later she felt it go down. He is not having any ongoing odynophagia and no regular heartburn or indigestion. She is on Prevacid 30 mg by mouth every morning. Patient is on Plavix and aspirin for history of peripheral vascular disease and had undergone a redo femoropopliteal bypass in February 2016. She also has insulin-dependent diabetes mellitus,  hypertension, history of coronary artery disease, COPD  Review of Systems Pertinent positive and negative review of systems were noted in the above HPI section.  All other review of systems was otherwise negative.  Outpatient Encounter Prescriptions as of 05/09/2014  Medication Sig  . ACCU-CHEK AVIVA PLUS test strip   . Alcohol Swabs (B-D SINGLE USE SWABS REGULAR) PADS   . amitriptyline (ELAVIL) 75 MG tablet Take 75 mg by mouth at bedtime.  Marland Kitchen amLODipine (NORVASC) 10 MG tablet Take 10 mg by mouth daily.   Marland Kitchen aspirin 81 MG tablet Take 81 mg by mouth daily.  . cloNIDine (CATAPRES) 0.3 MG tablet Take 0.3 mg by mouth daily.   . clopidogrel (PLAVIX) 75 MG tablet Take 75 mg by mouth daily.   . cyclobenzaprine (FLEXERIL) 10 MG tablet Take 10 mg by mouth at bedtime as needed for muscle spasms.   . diphenhydrAMINE (BENADRYL) 25 MG tablet Take 25 mg by mouth every 6 (six) hours as needed for itching or allergies.  . DULoxetine (CYMBALTA) 30 MG capsule Take 30 mg by mouth at bedtime.  . hydrALAZINE (APRESOLINE) 25 MG tablet Take 25 mg by mouth 2 (two) times daily.   . hydrochlorothiazide (HYDRODIURIL) 25 MG tablet Take 25 mg by mouth daily.   Marland Kitchen HYDROcodone-acetaminophen (NORCO) 10-325 MG per tablet Take 1 tablet by mouth every 4 (four) hours as needed (pain).  . insulin aspart (NOVOLOG) 100 UNIT/ML injection Inject 5-10 Units into the skin 3 (three) times daily as needed for high blood sugar (cbg  over 200). Per sliding scale  . insulin glargine (LANTUS) 100 UNIT/ML injection Inject 80 Units into the skin at bedtime. Dispense syringes and needles in needed, dispense Lantus pen if approved by insurance.  . lansoprazole (PREVACID) 30 MG capsule Take 30 mg by mouth daily.   Marland Kitchen levothyroxine (SYNTHROID, LEVOTHROID) 125 MCG tablet Take 125 mcg by mouth daily before breakfast.  . lisinopril (PRINIVIL,ZESTRIL) 5 MG tablet Take 5 mg by mouth daily.  Marland Kitchen LORazepam (ATIVAN) 1 MG tablet Take 3 mg by mouth at bedtime.  .  methocarbamol (ROBAXIN) 500 MG tablet Take 1 tablet (500 mg total) by mouth 2 (two) times daily as needed for muscle spasms.  . metoprolol succinate (TOPROL-XL) 25 MG 24 hr tablet Take 25 mg by mouth daily.  . minoxidil (LONITEN) 2.5 MG tablet Take 2.5 mg by mouth daily.   . nortriptyline (PAMELOR) 75 MG capsule Take 75 mg by mouth at bedtime.  . pravastatin (PRAVACHOL) 40 MG tablet Take 40 mg by mouth daily.  . promethazine (PHENERGAN) 25 MG suppository Place 1 suppository (25 mg total) rectally every 6 (six) hours as needed for nausea or vomiting.  . tolterodine (DETROL LA) 4 MG 24 hr capsule Take 4 mg by mouth daily.    No Known Allergies Patient Active Problem List   Diagnosis Date Noted  . 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 PAD (peripheral artery disease) 03/16/2014  . Occlusion and stenosis of carotid artery without mention of cerebral infarction 05/19/2013  . Chest pain 09/21/2012  . Nausea vomiting and diarrhea 09/21/2012  . Acute kidney injury (nontraumatic) 09/21/2012  . Chronic pain syndrome 03/13/2012  . Acute kidney injury 03/13/2012  . Encephalopathy, toxic 03/12/2012  . Pain in limb 10/22/2011  . Atherosclerosis of native arteries of the extremities with intermittent claudication 10/08/2011  . Peripheral vascular disease, unspecified 07/30/2011  . CAD (coronary artery disease) 05/10/2011  . Hyperlipidemia   . Unstable angina 05/09/2011  . Peripheral vascular disease 12/19/2008  . CERVICAL CANCER 12/14/2008  . Personal history of malignant neoplasm of kidney 12/14/2008  . MI 12/14/2008  . COPD 12/14/2008  . IBS 12/14/2008  . Hypertension   . Personal history of colonic polyps 08/13/2008  . DISORDER, TOBACCO USE 08/04/2006  . Hypothyroidism   . Insulin dependent type 2 diabetes mellitus, controlled   . Lumbar disc disease   . ANXIETY STATE NOS 07/20/2006  . GERD 07/20/2006  . HX, PERSONAL, VENOUS  THROMBOSIS/EMBOLISM 07/20/2006   History   Social History  . Marital Status: Widowed    Spouse Name: N/A  . Number of Children: 2  . Years of Education: N/A   Occupational History  .     Social History Main Topics  . Smoking status: Current Some Day Smoker -- 0.25 packs/day for 50 years    Types: Cigarettes    Last Attempt to Quit: 07/12/2011  . Smokeless tobacco: Never Used  . Alcohol Use: No  . Drug Use: No  . Sexual Activity: Not on file   Other Topics Concern  . Not on file   Social History Narrative   Widow. Two sons. Husband died of lung cancer.               Ms. Krack family history includes Heart disease in her father, mother, and sister; Lung cancer in her father and mother.      Objective:    Filed Vitals:   05/09/14 0934  BP: 105/58  Pulse: 86    Physical Exam    well-developed older white female in no acute distress, pleasant blood pressure 105/58 pulse 86 height 5 foot 1 weight 159. HEENT; nontraumatic normocephalic EOMI PERRLA sclera anicteric neck; supple no JVD, Cardiovascular; regular rate and rhythm with S1-S2 no murmur or gallop, Pulmonary; clear bilaterally, Abdomen; soft nontender nondistended bowel sounds are active there is no palpable mass or hepatosplenomegaly, Extremities ;no clubbing cyanosis or edema, Neuropsych; mood and affect appropriate       Assessment & Plan:  #1 69 yo female with recurrent solid food dysphagia and recent food impaction.Suspect peptic stricture. #2 hx of severe esophagitis 2012 -associated with DKA #3 hx of adenomatous polyps -2012 #4 diverticulosis #5 HTN #6PVD-s/p recent recent fem- pop bypass #7 chronic antiplatelet RX with Plavix and ASA #8  IDDM  Plan; Continue Prevacid 30 mg po Qam Pt advised to avoid meat and bread for now Schedule for EGD with dilation with Dr Ardis Hughs next week . Procedure discussed in detail with pt, and relative risk/benefit of holding Plavix discussed as well and she is  agreeable to proceed. Will confirm with pt's Cardiologist Dr. Johnsie Cancel that holding  Plavix is acceptable  for this pt.  She should have follow up colonoscopy 2017   Alfredia Ferguson PA-C 05/09/2014   Cc: [name], MD

## 2014-05-09 NOTE — Telephone Encounter (Signed)
  05/09/2014   RE: April Branch DOB: Sep 05, 1945 MRN: [id number]   Dear  Dr. [name],    We have scheduled the above patient for an endoscopic procedure. Our records show that she is on anticoagulation therapy.   Please advise as to how long the patient may come off her therapy of  Plavix prior to the procedure, which is scheduled for  05-16-2014.  Please fax back/ or route the completed form to [place] at [phone].   Sincerely,    Amy Esterwood PA-C

## 2014-05-09 NOTE — Telephone Encounter (Signed)
Ok to hold plavix for EGD

## 2014-05-09 NOTE — Telephone Encounter (Signed)
RESPONSE  SENT  TO  PAM  PETERMAN

## 2014-05-10 ENCOUNTER — Other Ambulatory Visit: Payer: Self-pay | Admitting: *Deleted

## 2014-05-10 DIAGNOSIS — I739 Peripheral vascular disease, unspecified: Secondary | ICD-10-CM

## 2014-05-10 NOTE — Progress Notes (Signed)
i agree with the above note, plan 

## 2014-05-10 NOTE — Telephone Encounter (Signed)
I called the patient and told her to stop the Plavix tomorrow 05-11-2014 and she can resume it on 05-17-2014 the day after the procedure.

## 2014-05-14 ENCOUNTER — Encounter (HOSPITAL_COMMUNITY): Payer: Self-pay | Admitting: *Deleted

## 2014-05-14 NOTE — Progress Notes (Signed)
I spoke with Dr Marcie Bal and informed him of that patients CBG has been "running High", 420 this am. Dr Marcie Bal stated that patient should try to get blood sugar down and work with PCP .  I called patient back and she said PCP- Dr Deforest Hoyles of [place] was aware that patient's CBG was running high he told her to take more Lantus, "he told me to take 100 Units, but my synringe only holds 80."   I asked patient if she could take 2 injections, "I don't like to , but I will now."

## 2014-05-14 NOTE — Progress Notes (Addendum)
Ms Biegler reported that CBG was 420 this am- states it has been running high.  "I'm trying to get it down".  Dr Deforest Hoyles in [place] is PCP who manages DM.  I faxed a request to Dr Volney American requesting office notes and labs.  Patient states that she is not taking any medications that morning of surgery.  Patient had a hard time telling what medications she is on.; attempted to read bottles.  I instructed patient to bring a current list of medications or current medication bottles on the day of surgery.

## 2014-05-16 ENCOUNTER — Ambulatory Visit (HOSPITAL_COMMUNITY): Payer: Medicare PPO | Admitting: Anesthesiology

## 2014-05-16 ENCOUNTER — Ambulatory Visit (HOSPITAL_COMMUNITY)
Admission: RE | Admit: 2014-05-16 | Discharge: 2014-05-16 | Disposition: A | Discharge status: Home / Self Care | Payer: Medicare PPO | Source: Ambulatory Visit | Attending: Gastroenterology | Admitting: Gastroenterology

## 2014-05-16 ENCOUNTER — Encounter (HOSPITAL_COMMUNITY)
Admission: RE | Disposition: A | Discharge status: Home / Self Care | Payer: Self-pay | Source: Ambulatory Visit | Attending: Gastroenterology

## 2014-05-16 ENCOUNTER — Encounter (HOSPITAL_COMMUNITY): Payer: Self-pay | Admitting: *Deleted

## 2014-05-16 DIAGNOSIS — J449 Chronic obstructive pulmonary disease, unspecified: Secondary | ICD-10-CM | POA: Insufficient documentation

## 2014-05-16 DIAGNOSIS — M797 Fibromyalgia: Secondary | ICD-10-CM | POA: Diagnosis not present

## 2014-05-16 DIAGNOSIS — Z7982 Long term (current) use of aspirin: Secondary | ICD-10-CM | POA: Diagnosis not present

## 2014-05-16 DIAGNOSIS — E039 Hypothyroidism, unspecified: Secondary | ICD-10-CM | POA: Insufficient documentation

## 2014-05-16 DIAGNOSIS — F17210 Nicotine dependence, cigarettes, uncomplicated: Secondary | ICD-10-CM | POA: Insufficient documentation

## 2014-05-16 DIAGNOSIS — I252 Old myocardial infarction: Secondary | ICD-10-CM | POA: Diagnosis not present

## 2014-05-16 DIAGNOSIS — K2970 Gastritis, unspecified, without bleeding: Secondary | ICD-10-CM | POA: Diagnosis not present

## 2014-05-16 DIAGNOSIS — G894 Chronic pain syndrome: Secondary | ICD-10-CM | POA: Diagnosis not present

## 2014-05-16 DIAGNOSIS — Z7901 Long term (current) use of anticoagulants: Secondary | ICD-10-CM

## 2014-05-16 DIAGNOSIS — I2510 Atherosclerotic heart disease of native coronary artery without angina pectoris: Secondary | ICD-10-CM | POA: Diagnosis not present

## 2014-05-16 DIAGNOSIS — E119 Type 2 diabetes mellitus without complications: Secondary | ICD-10-CM | POA: Diagnosis not present

## 2014-05-16 DIAGNOSIS — I10 Essential (primary) hypertension: Secondary | ICD-10-CM | POA: Insufficient documentation

## 2014-05-16 DIAGNOSIS — R1310 Dysphagia, unspecified: Secondary | ICD-10-CM

## 2014-05-16 DIAGNOSIS — K222 Esophageal obstruction: Secondary | ICD-10-CM | POA: Insufficient documentation

## 2014-05-16 DIAGNOSIS — Z794 Long term (current) use of insulin: Secondary | ICD-10-CM | POA: Insufficient documentation

## 2014-05-16 DIAGNOSIS — Z86010 Personal history of colonic polyps: Secondary | ICD-10-CM | POA: Insufficient documentation

## 2014-05-16 DIAGNOSIS — R634 Abnormal weight loss: Secondary | ICD-10-CM | POA: Diagnosis not present

## 2014-05-16 DIAGNOSIS — F329 Major depressive disorder, single episode, unspecified: Secondary | ICD-10-CM | POA: Insufficient documentation

## 2014-05-16 DIAGNOSIS — F411 Generalized anxiety disorder: Secondary | ICD-10-CM | POA: Diagnosis not present

## 2014-05-16 DIAGNOSIS — E785 Hyperlipidemia, unspecified: Secondary | ICD-10-CM | POA: Diagnosis not present

## 2014-05-16 DIAGNOSIS — IMO0001 Reserved for inherently not codable concepts without codable children: Secondary | ICD-10-CM

## 2014-05-16 DIAGNOSIS — Z85528 Personal history of other malignant neoplasm of kidney: Secondary | ICD-10-CM | POA: Insufficient documentation

## 2014-05-16 DIAGNOSIS — Z7902 Long term (current) use of antithrombotics/antiplatelets: Secondary | ICD-10-CM | POA: Diagnosis not present

## 2014-05-16 DIAGNOSIS — R1314 Dysphagia, pharyngoesophageal phase: Secondary | ICD-10-CM

## 2014-05-16 HISTORY — PX: ESOPHAGOGASTRODUODENOSCOPY (EGD) WITH PROPOFOL: SHX5813

## 2014-05-16 LAB — GLUCOSE, CAPILLARY: GLUCOSE-CAPILLARY: 105 mg/dL — AB (ref 70–99)

## 2014-05-16 SURGERY — ESOPHAGOGASTRODUODENOSCOPY (EGD) WITH PROPOFOL
Anesthesia: Monitor Anesthesia Care

## 2014-05-16 MED ORDER — PROPOFOL INFUSION 10 MG/ML OPTIME
INTRAVENOUS | Status: DC | PRN
  Administered 2014-05-16: 100 ug/kg/min via INTRAVENOUS

## 2014-05-16 MED ORDER — MIDAZOLAM HCL 5 MG/5ML IJ SOLN
INTRAMUSCULAR | Status: DC | PRN
  Administered 2014-05-16: 2 mg via INTRAVENOUS

## 2014-05-16 MED ORDER — LANSOPRAZOLE 30 MG PO CPDR: 30.0000 mg | DELAYED_RELEASE_CAPSULE | Freq: Two times a day (BID) | ORAL | Status: DC

## 2014-05-16 MED ORDER — LACTATED RINGERS IV SOLN
INTRAVENOUS | Status: DC | PRN
  Administered 2014-05-16: 09:00:00 via INTRAVENOUS

## 2014-05-16 MED ORDER — SODIUM CHLORIDE 0.9 % IV SOLN: INTRAVENOUS | Status: DC

## 2014-05-16 MED ORDER — LACTATED RINGERS IV SOLN
INTRAVENOUS | Status: DC
  Administered 2014-05-16: 1000 mL via INTRAVENOUS

## 2014-05-16 NOTE — Anesthesia Postprocedure Evaluation (Signed)
  Anesthesia Post-op Note  Patient: April Branch  Procedure(s) Performed: Procedure(s): ESOPHAGOGASTRODUODENOSCOPY (EGD) WITH PROPOFOL with Balloon dilation (N/A)  Patient Location: PACU  Anesthesia Type:MAC  Level of Consciousness: awake, alert  and oriented  Airway and Oxygen Therapy: Patient Spontanous Breathing and Patient connected to nasal cannula oxygen  Post-op Pain: none  Post-op Assessment: Post-op Vital signs reviewed, Patient's Cardiovascular Status Stable, Respiratory Function Stable, Patent Airway, No signs of Nausea or vomiting and Pain level controlled  Post-op Vital Signs: Reviewed and stable  Last Vitals:  Filed Vitals:   05/16/14 0923  BP: 105/57  Pulse: 69  Temp: 36.8 C  Resp: 13    Complications: No apparent anesthesia complications

## 2014-05-16 NOTE — H&P (View-Only) (Signed)
Patient ID: April Branch, female   DOB: [birth year], 69 y.o.   MRN: [id number]   Subjective:    Patient ID: April Branch, female    DOB: March 24, 1945, 69 y.o.   MRN: [id number]  HPI April Branch is a pleasant 69 year old white female known remotely to Dr. Ardis Hughs. She comes in today with complaints of solid food dysphagia over the past month. Patient had undergone EGD during a hospitalization in 2012 for DKA. She was found to have grade D esophagitis, no evidence of stricture. Later that year while hospitalized again with abnormal CT scan and right-sided colitis she had undergone colonoscopy with Dr. Olevia Perches which showed severe diverticulosis in the sigmoid colon 2 small adenomatous polyps were removed and there was no evidence of right-sided colitis. Patient states that she had been having intermittent difficulty with solid food dysphagia since early February. She had an ER visit on February 28 with a food impaction. She said she had eaten a bologna sandwich vomited back up and then the following morning 8 another bologna sandwich which became lodged. She had chest pain with this and says "she thought she was having a heart attack". He rarely the food impaction resolved with a GI cocktail and glucagon. She says since then she is having very frequent symptoms of solid food dysphagia. She says is uncomfortable when the food hangs up generally she waits long enough it will go on through. A few days ago she had eaten a sausage and eggs and then needed ate  a piece of bread which became lodged. She tried to regurgitate unsuccessfully and then she says about 10 hours later she felt it go down. He is not having any ongoing odynophagia and no regular heartburn or indigestion. She is on Prevacid 30 mg by mouth every morning. Patient is on Plavix and aspirin for history of peripheral vascular disease and had undergone a redo femoropopliteal bypass in February 2016. She also has insulin-dependent diabetes mellitus,  hypertension, history of coronary artery disease, COPD  Review of Systems Pertinent positive and negative review of systems were noted in the above HPI section.  All other review of systems was otherwise negative.  Outpatient Encounter Prescriptions as of 05/09/2014  Medication Sig  . ACCU-CHEK AVIVA PLUS test strip   . Alcohol Swabs (B-D SINGLE USE SWABS REGULAR) PADS   . amitriptyline (ELAVIL) 75 MG tablet Take 75 mg by mouth at bedtime.  Marland Kitchen amLODipine (NORVASC) 10 MG tablet Take 10 mg by mouth daily.   Marland Kitchen aspirin 81 MG tablet Take 81 mg by mouth daily.  . cloNIDine (CATAPRES) 0.3 MG tablet Take 0.3 mg by mouth daily.   . clopidogrel (PLAVIX) 75 MG tablet Take 75 mg by mouth daily.   . cyclobenzaprine (FLEXERIL) 10 MG tablet Take 10 mg by mouth at bedtime as needed for muscle spasms.   . diphenhydrAMINE (BENADRYL) 25 MG tablet Take 25 mg by mouth every 6 (six) hours as needed for itching or allergies.  . DULoxetine (CYMBALTA) 30 MG capsule Take 30 mg by mouth at bedtime.  . hydrALAZINE (APRESOLINE) 25 MG tablet Take 25 mg by mouth 2 (two) times daily.   . hydrochlorothiazide (HYDRODIURIL) 25 MG tablet Take 25 mg by mouth daily.   Marland Kitchen HYDROcodone-acetaminophen (NORCO) 10-325 MG per tablet Take 1 tablet by mouth every 4 (four) hours as needed (pain).  . insulin aspart (NOVOLOG) 100 UNIT/ML injection Inject 5-10 Units into the skin 3 (three) times daily as needed for high blood sugar (cbg  over 200). Per sliding scale  . insulin glargine (LANTUS) 100 UNIT/ML injection Inject 80 Units into the skin at bedtime. Dispense syringes and needles in needed, dispense Lantus pen if approved by insurance.  . lansoprazole (PREVACID) 30 MG capsule Take 30 mg by mouth daily.   Marland Kitchen levothyroxine (SYNTHROID, LEVOTHROID) 125 MCG tablet Take 125 mcg by mouth daily before breakfast.  . lisinopril (PRINIVIL,ZESTRIL) 5 MG tablet Take 5 mg by mouth daily.  Marland Kitchen LORazepam (ATIVAN) 1 MG tablet Take 3 mg by mouth at bedtime.  .  methocarbamol (ROBAXIN) 500 MG tablet Take 1 tablet (500 mg total) by mouth 2 (two) times daily as needed for muscle spasms.  . metoprolol succinate (TOPROL-XL) 25 MG 24 hr tablet Take 25 mg by mouth daily.  . minoxidil (LONITEN) 2.5 MG tablet Take 2.5 mg by mouth daily.   . nortriptyline (PAMELOR) 75 MG capsule Take 75 mg by mouth at bedtime.  . pravastatin (PRAVACHOL) 40 MG tablet Take 40 mg by mouth daily.  . promethazine (PHENERGAN) 25 MG suppository Place 1 suppository (25 mg total) rectally every 6 (six) hours as needed for nausea or vomiting.  . tolterodine (DETROL LA) 4 MG 24 hr capsule Take 4 mg by mouth daily.    No Known Allergies Patient Active Problem List   Diagnosis Date Noted  . 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 PAD (peripheral artery disease) 03/16/2014  . Occlusion and stenosis of carotid artery without mention of cerebral infarction 05/19/2013  . Chest pain 09/21/2012  . Nausea vomiting and diarrhea 09/21/2012  . Acute kidney injury (nontraumatic) 09/21/2012  . Chronic pain syndrome 03/13/2012  . Acute kidney injury 03/13/2012  . Encephalopathy, toxic 03/12/2012  . Pain in limb 10/22/2011  . Atherosclerosis of native arteries of the extremities with intermittent claudication 10/08/2011  . Peripheral vascular disease, unspecified 07/30/2011  . CAD (coronary artery disease) 05/10/2011  . Hyperlipidemia   . Unstable angina 05/09/2011  . Peripheral vascular disease 12/19/2008  . CERVICAL CANCER 12/14/2008  . Personal history of malignant neoplasm of kidney 12/14/2008  . MI 12/14/2008  . COPD 12/14/2008  . IBS 12/14/2008  . Hypertension   . Personal history of colonic polyps 08/13/2008  . DISORDER, TOBACCO USE 08/04/2006  . Hypothyroidism   . Insulin dependent type 2 diabetes mellitus, controlled   . Lumbar disc disease   . ANXIETY STATE NOS 07/20/2006  . GERD 07/20/2006  . HX, PERSONAL, VENOUS  THROMBOSIS/EMBOLISM 07/20/2006   History   Social History  . Marital Status: Widowed    Spouse Name: N/A  . Number of Children: 2  . Years of Education: N/A   Occupational History  .     Social History Main Topics  . Smoking status: Current Some Day Smoker -- 0.25 packs/day for 50 years    Types: Cigarettes    Last Attempt to Quit: 07/12/2011  . Smokeless tobacco: Never Used  . Alcohol Use: No  . Drug Use: No  . Sexual Activity: Not on file   Other Topics Concern  . Not on file   Social History Narrative   Widow. Two sons. Husband died of lung cancer.               Ms. Rahm family history includes Heart disease in her father, mother, and sister; Lung cancer in her father and mother.      Objective:    Filed Vitals:   05/09/14 0934  BP: 105/58  Pulse: 86    Physical Exam    well-developed older white female in no acute distress, pleasant blood pressure 105/58 pulse 86 height 5 foot 1 weight 159. HEENT; nontraumatic normocephalic EOMI PERRLA sclera anicteric neck; supple no JVD, Cardiovascular; regular rate and rhythm with S1-S2 no murmur or gallop, Pulmonary; clear bilaterally, Abdomen; soft nontender nondistended bowel sounds are active there is no palpable mass or hepatosplenomegaly, Extremities ;no clubbing cyanosis or edema, Neuropsych; mood and affect appropriate       Assessment & Plan:  #1 69 yo female with recurrent solid food dysphagia and recent food impaction.Suspect peptic stricture. #2 hx of severe esophagitis 2012 -associated with DKA #3 hx of adenomatous polyps -2012 #4 diverticulosis #5 HTN #6PVD-s/p recent recent fem- pop bypass #7 chronic antiplatelet RX with Plavix and ASA #8  IDDM  Plan; Continue Prevacid 30 mg po Qam Pt advised to avoid meat and bread for now Schedule for EGD with dilation with Dr Ardis Hughs next week . Procedure discussed in detail with pt, and relative risk/benefit of holding Plavix discussed as well and she is  agreeable to proceed. Will confirm with pt's Cardiologist Dr. Johnsie Cancel that holding  Plavix is acceptable  for this pt.  She should have follow up colonoscopy 2017   Alfredia Ferguson PA-C 05/09/2014   Cc: [name], MD

## 2014-05-16 NOTE — Anesthesia Preprocedure Evaluation (Addendum)
Anesthesia Evaluation  Patient identified by MRN, date of birth, ID band Patient awake    Reviewed: Allergy & Precautions, H&P , NPO status , Patient's Chart, lab work & pertinent test results  History of Anesthesia Complications Negative for: history of anesthetic complications  Airway Mallampati: I  TM Distance: >3 FB Neck ROM: Full    Dental  (+) Edentulous Upper, Edentulous Lower, Dental Advisory Given, Upper Dentures, Lower Dentures   Pulmonary COPDCurrent Smoker, former smoker,    Pulmonary exam normal       Cardiovascular hypertension, Pt. on medications + angina + CAD, + Past MI and + Peripheral Vascular Disease  '13 ECHO: EF 55-60%, valves OK 04/10/14 stress test: normal perfusio   Neuro/Psych PSYCHIATRIC DISORDERS Anxiety Depression  Neuromuscular disease CVA    GI/Hepatic Neg liver ROS, GERD-  ,  Endo/Other  diabetesHypothyroidism   Renal/GU      Musculoskeletal  (+) Arthritis -, Fibromyalgia -  Abdominal   Peds  Hematology  (+) anemia ,   Anesthesia Other Findings   Reproductive/Obstetrics                           Anesthesia Physical Anesthesia Plan  ASA: III  Anesthesia Plan: MAC   Post-op Pain Management:    Induction: Intravenous  Airway Management Planned: Simple Face Mask  Additional Equipment:   Intra-op Plan:   Post-operative Plan:   Informed Consent: I have reviewed the patients History and Physical, chart, labs and discussed the procedure including the risks, benefits and alternatives for the proposed anesthesia with the patient or authorized representative who has indicated his/her understanding and acceptance.   Dental advisory given and Dental Advisory Given  Plan Discussed with: CRNA, Anesthesiologist and Surgeon  Anesthesia Plan Comments:        Anesthesia Quick Evaluation

## 2014-05-16 NOTE — Op Note (Signed)
[facility] [place], [id number]   ENDOSCOPY PROCEDURE REPORT  PATIENT: April, Branch  MR#: [id number] BIRTHDATE: Jan 22, 1946 , 76  yrs. old GENDER: female ENDOSCOPIST: Milus Banister, MD PROCEDURE DATE:  05/16/2014 PROCEDURE:  EGD w/ balloon dilation ASA CLASS:     Class III INDICATIONS:  recent food impaction, persistent dysphagia, mild weight loss (8 pounds). MEDICATIONS: Monitored anesthesia care TOPICAL ANESTHETIC: none  DESCRIPTION OF PROCEDURE: After the risks benefits and alternatives of the procedure were thoroughly explained, informed consent was obtained.  The Pentax Gastroscope [id number] endoscope was introduced through the mouth and advanced to the second portion of the duodenum , Without limitations.  The instrument was slowly withdrawn as the mucosa was fully examined.    The esophagus was somewhat narrowed diffusely but adult gastroscope passed easily into the stomach.  The GE junction was slightly strictured and was dilated to 78mm using CRE TTS balloon held inflated for one minute.  There was mild, non-specific gastritis. The examination was otherwise normal.  Retroflexed views revealed no abnormalities.     The scope was then withdrawn from the patient and the procedure completed.  COMPLICATIONS: There were no immediate complications.  ENDOSCOPIC IMPRESSION: The esophagus was somewhat narrowed diffusely but adult gastroscope passed easily into the stomach.  The GE junction was slightly strictured and was dilated to 58mm using CRE TTS balloon held inflated for one minute.  There was mild, non-specific gastritis. The examination was otherwise normal  RECOMMENDATIONS: Please increase your prevacid to twice daily, these are best take 20-30 min prior to breakfast and dinner meals.  Chew your food well, eat slowly and take small bites. OK to resume your plavix today.  Please call Dr. Ardis Hughs' office in 4-5 weeks  to report on your response to this dilation.  eSigned:  Milus Banister, MD 05/16/2014 9:24 AM

## 2014-05-16 NOTE — Interval H&P Note (Signed)
History and Physical Interval Note:  05/16/2014 8:51 AM  April Branch  has presented today for surgery, with the diagnosis of Dysphagia Chronic anticoagulation  The various methods of treatment have been discussed with the patient and family. After consideration of risks, benefits and other options for treatment, the patient has consented to  Procedure(s): ESOPHAGOGASTRODUODENOSCOPY (EGD) WITH PROPOFOL with Balloon dilation (N/A) as a surgical intervention .  The patient's history has been reviewed, patient examined, no change in status, stable for surgery.  I have reviewed the patient's chart and labs.  Questions were answered to the patient's satisfaction.     Milus Banister

## 2014-05-16 NOTE — Transfer of Care (Signed)
Immediate Anesthesia Transfer of Care Note  Patient: April Branch  Procedure(s) Performed: Procedure(s): ESOPHAGOGASTRODUODENOSCOPY (EGD) WITH PROPOFOL with Balloon dilation (N/A)  Patient Location: Endoscopy Unit  Anesthesia Type:MAC  Level of Consciousness: awake, alert  and oriented  Airway & Oxygen Therapy: Patient Spontanous Breathing and Patient connected to nasal cannula oxygen  Post-op Assessment: Report given to RN, Post -op Vital signs reviewed and stable and Patient moving all extremities X 4  Post vital signs: Reviewed and stable  Last Vitals:  Filed Vitals:   05/16/14 0923  BP: 105/57  Pulse: 69  Temp: 36.8 C  Resp: 13    Complications: No apparent anesthesia complications

## 2014-05-16 NOTE — Discharge Instructions (Signed)
YOU HAD AN ENDOSCOPIC PROCEDURE TODAY: Refer to the procedure report that was given to you for any specific questions about what was found during the examination.  If the procedure report does not answer your questions, please call your gastroenterologist to clarify. ° °YOU SHOULD EXPECT: Some feelings of bloating in the abdomen. Passage of more gas than usual.  Walking can help get rid of the air that was put into your GI tract during the procedure and reduce the bloating. If you had a lower endoscopy (such as a colonoscopy or flexible sigmoidoscopy) you may notice spotting of blood in your stool or on the toilet paper.  ° °DIET: Your first meal following the procedure should be a light meal and then it is ok to progress to your normal diet.  A half-sandwich or bowl of soup is an example of a good first meal.  Heavy or fried foods are harder to digest and may make you feel nasueas or bloated.  Drink plenty of fluids but you should avoid alcoholic beverages for 24 hours. ° °ACTIVITY: Your care partner should take you home directly after the procedure.  You should plan to take it easy, moving slowly for the rest of the day.  You can resume normal activity the day after the procedure however you should NOT DRIVE or use heavy machinery for 24 hours (because of the sedation medicines used during the test).   ° °SYMPTOMS TO REPORT IMMEDIATELY  °A gastroenterologist can be reached at any hour.  Please call your doctor's office for any of the following symptoms: ° °· Following lower endoscopy (colonoscopy, flexible sigmoidoscopy) ° Excessive amounts of blood in the stool ° Significant tenderness, worsening of abdominal pains ° Swelling of the abdomen that is new, acute ° Fever of 100° or higher °· Following upper endoscopy (EGD, EUS, ERCP) ° Vomiting of blood or coffee ground material ° New, significant abdominal pain ° New, significant chest pain or pain under the shoulder blades ° Painful or persistently difficult  swallowing ° New shortness of breath ° Black, tarry-looking stools ° °FOLLOW UP: °If any biopsies were taken you will be contacted by phone or by letter within the next 1-3 weeks.  Call your gastroenterologist if you have not heard about the biopsies in 3 weeks.  °Please also call your gastroenterologist's office with any specific questions about appointments or follow up tests. ° °Conscious Sedation, Adult, Care After °Refer to this sheet in the next few weeks. These instructions provide you with information on caring for yourself after your procedure. Your health care provider may also give you more specific instructions. Your treatment has been planned according to current medical practices, but problems sometimes occur. Call your health care provider if you have any problems or questions after your procedure. °WHAT TO EXPECT AFTER THE PROCEDURE  °After your procedure: °· You may feel sleepy, clumsy, and have poor balance for several hours. °· Vomiting may occur if you eat too soon after the procedure. °HOME CARE INSTRUCTIONS °· Do not participate in any activities where you could become injured for at least 24 hours. Do not: °¨ Drive. °¨ Swim. °¨ Ride a bicycle. °¨ Operate heavy machinery. °¨ Cook. °¨ Use power tools. °¨ Climb ladders. °¨ Work from a high place. °· Do not make important decisions or sign legal documents until you are improved. °· If you vomit, drink water, juice, or soup when you can drink without vomiting. Make sure you have little or no nausea before eating   solid foods. °· Only take over-the-counter or prescription medicines for pain, discomfort, or fever as directed by your health care provider. °· Make sure you and your family fully understand everything about the medicines given to you, including what side effects may occur. °· You should not drink alcohol, take sleeping pills, or take medicines that cause drowsiness for at least 24 hours. °· If you smoke, do not smoke without  supervision. °· If you are feeling better, you may resume normal activities 24 hours after you were sedated. °· Keep all appointments with your health care provider. °SEEK MEDICAL CARE IF: °· Your skin is pale or bluish in color. °· You continue to feel nauseous or vomit. °· Your pain is getting worse and is not helped by medicine. °· You have bleeding or swelling. °· You are still sleepy or feeling clumsy after 24 hours. °SEEK IMMEDIATE MEDICAL CARE IF: °· You develop a rash. °· You have difficulty breathing. °· You develop any type of allergic problem. °· You have a fever. °MAKE SURE YOU: °· Understand these instructions. °· Will watch your condition. °· Will get help right away if you are not doing well or get worse. °Document Released: 11/30/2012 Document Reviewed: 11/30/2012 °[employer]® Patient Information ©2015 [employer]. This information is not intended to replace advice given to you by your health care provider. Make sure you discuss any questions you have with your health care provider. ° °

## 2014-05-17 ENCOUNTER — Encounter (HOSPITAL_COMMUNITY): Payer: Self-pay | Admitting: Gastroenterology

## 2014-06-12 ENCOUNTER — Ambulatory Visit: Payer: Self-pay | Admitting: Gastroenterology

## 2014-08-14 ENCOUNTER — Encounter: Payer: Self-pay | Admitting: Family

## 2014-08-16 ENCOUNTER — Ambulatory Visit (INDEPENDENT_AMBULATORY_CARE_PROVIDER_SITE_OTHER): Payer: Medicare PPO | Admitting: Family

## 2014-08-16 ENCOUNTER — Ambulatory Visit (HOSPITAL_COMMUNITY)
Admission: RE | Admit: 2014-08-16 | Discharge: 2014-08-16 | Disposition: A | Discharge status: Home / Self Care | Payer: Medicare PPO | Source: Ambulatory Visit | Attending: Vascular Surgery | Admitting: Vascular Surgery

## 2014-08-16 ENCOUNTER — Encounter: Payer: Self-pay | Admitting: Family

## 2014-08-16 VITALS — BP 95/62 | HR 80 | Resp 16 | Ht 61.0 in | Wt 153.0 lb

## 2014-08-16 DIAGNOSIS — E1159 Type 2 diabetes mellitus with other circulatory complications: Secondary | ICD-10-CM

## 2014-08-16 DIAGNOSIS — Z9889 Other specified postprocedural states: Secondary | ICD-10-CM | POA: Diagnosis not present

## 2014-08-16 DIAGNOSIS — E1151 Type 2 diabetes mellitus with diabetic peripheral angiopathy without gangrene: Secondary | ICD-10-CM

## 2014-08-16 DIAGNOSIS — I779 Disorder of arteries and arterioles, unspecified: Secondary | ICD-10-CM

## 2014-08-16 DIAGNOSIS — Z95828 Presence of other vascular implants and grafts: Secondary | ICD-10-CM

## 2014-08-16 DIAGNOSIS — E1165 Type 2 diabetes mellitus with hyperglycemia: Secondary | ICD-10-CM

## 2014-08-16 DIAGNOSIS — I70202 Unspecified atherosclerosis of native arteries of extremities, left leg: Secondary | ICD-10-CM | POA: Insufficient documentation

## 2014-08-16 DIAGNOSIS — I739 Peripheral vascular disease, unspecified: Secondary | ICD-10-CM | POA: Diagnosis present

## 2014-08-16 DIAGNOSIS — IMO0002 Reserved for concepts with insufficient information to code with codable children: Secondary | ICD-10-CM

## 2014-08-16 DIAGNOSIS — S90424A Blister (nonthermal), right lesser toe(s), initial encounter: Secondary | ICD-10-CM

## 2014-08-16 DIAGNOSIS — I6523 Occlusion and stenosis of bilateral carotid arteries: Secondary | ICD-10-CM | POA: Diagnosis not present

## 2014-08-16 NOTE — Progress Notes (Signed)
[facility] [place] HISTORY AND PHYSICAL -PAD  History of Present Illness April Branch is a 69 y.o. female patient of Dr. Oneida Alar who is status post left femoropopliteal bypass graft with propaten on 10/12/2011 with subsequent occlusion, and left femoral to below-knee popliteal bypass with propaten on 04/17/2014.  She also has a history of carotid artery stenosis.   She returns today for follow up. She denies non healing wounds. She has severe vertigo/ataxia. States she had brain imaging about 2010 which demonstrated 3 TIA'S, has intermittent dizziness, has tingling and numbness in both hands, weakness in both hands, moreso in left.  Carotid Duplex on file result was from 8/5/20013, showed 60-79% right ICA stenosis and 40-59% left ICA stenosis, requested by Dr. Burt Knack, pt does not recall this.  March, 2015 carotid Duplex demonstrated <40% stenosis in both internal carotid arteries.  She has known lumbar spine issues with radiculopathy in both legs.  She gets choked easily, has to drink through a straw.  She no longer has dry cracked fissures on both heals.  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  She has intermittent dizziness, has tingling and numbness in both hands, weakness in both hands, moreso in left.  She had severe headaches several years ago which prompted brain imaging, states was found she had 3 ministrokes, she does not recall any TIA symptoms other than the headache, denies lateralizing symptoms.   She was hospitalized recently at [facility] for dehydration, does not know how she got dehydrated.   Pt states her B12 level has not been checked, she reports worsening ataxia over the last year.   Pt Diabetic: Yes, uncontrolled, states her blood sugars have been in the 400's  Pt smoker: former smoker (quit January 2016, started at age 66 yrs)  Pt meds include:  Statin :Yes  ASA: No  Other anticoagulants/antiplatelets:  Plavix      Past Medical History  Diagnosis Date  . Hyperlipidemia   . Arthritis   . Hypothyroidism   . GERD (gastroesophageal reflux disease)   . Anxiety   . Diabetes mellitus age 40    insulin dependent  . Peripheral vascular disease   . Lumbar disc disease   . Insulin dependent type 2 diabetes mellitus, controlled   . GERD 07/20/2006  . CAD (coronary artery disease)   . CAD (coronary artery disease) of bypass graft   . Vertigo   . History of transient ischemic attack (TIA)   . Myocardial infarction     AGE 37    . Hypertension   . Depression   . COPD 12/14/2008    PATIENT DENIES    . Personal history of malignant neoplasm of kidney(V10.52) 12/14/2008    Laparoscopic biopsy and cryoablation 7/08 Dr. Vernie Shanks   . Headache(784.0)   . Neuromuscular disorder     PERIPHERAL NEUROPATHY  . Anemia   . Diabetic coma Feb. 2014  . Fall at home Jan. 2, 2016  Jan. 13, 2016    Left knee  . Anginal pain     1 MONTH AGO   . Stroke     3 MINI STROKES  RT SIDED WEAKNESS  . Fibromyalgia   . HPV (human papilloma virus) infection   . Colon polyps 09/11/2010    Tubular adenoma  . 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 Smoking status:  Current Every Day Smoker -- 0.25 packs/day for 50 years    Types: Cigarettes  . Smokeless tobacco: Never Used  . Alcohol Use: No    Family History Family History  Problem Relation Age of Onset  . Heart disease Mother   . Lung cancer Mother   . Cancer Mother     Lung  . Heart disease Father   . Lung cancer Father   . Cancer Father     Lung  . Heart disease Sister     CABG- Open Heart    Past Surgical History  Procedure Laterality Date  . Appendectomy    . Abdominal hysterectomy    . Knee surgery Bilateral     "cartilage"  . Cholecystectomy    . Lung surgery      lung nodule removed from the right side  . Laparascopic cryoablation of left kidney   08/2006    Dr. Gladis Riffle for renal cell cancer  . Bladder suspension    . Shoulder surgery Bilateral     'Spurs"  . Foot surgery    . Pr vein bypass graft,aorto-fem-pop  05/27/2010  . Cardiac catheterization    . Femoral-popliteal bypass graft  10/12/2011    Procedure: BYPASS GRAFT FEMORAL-POPLITEAL ARTERY;  Surgeon: Elam Dutch, MD;  Location: [facility] OR;  Service: Vascular;  Laterality: Left;  Left Femoral-Popliteal Bypass Graft using 69mm x 80cm Propaten Graft with intraop arteriogram times one.  . Intraoperative arteriogram  10/12/2011    Procedure: INTRA OPERATIVE ARTERIOGRAM;  Surgeon: Elam Dutch, MD;  Location: [place];  Service: Vascular;  Laterality: Left;  . Left heart catheterization with coronary angiogram N/A 05/11/2011    Procedure: LEFT HEART CATHETERIZATION WITH CORONARY ANGIOGRAM;  Surgeon: Jolaine Artist, MD;  Location: [facility] CATH LAB;  Service: Cardiovascular;  Laterality: N/A;  . Abdominal aortagram N/A 08/21/2011    Procedure: ABDOMINAL Maxcine Ham;  Surgeon: Elam Dutch, MD;  Location: [facility] CATH LAB;  Service: Cardiovascular;  Laterality: N/A;  . Abdominal aortagram N/A 03/16/2014    Procedure: ABDOMINAL Maxcine Ham;  Surgeon: Elam Dutch, MD;  Location: [facility] CATH LAB;  Service: Cardiovascular;  Laterality: N/A;  . Femoral-popliteal bypass graft Left 04/17/2014    Procedure: REDO LEFT FEMORAL-POPLITEAL ARTERY BYPASS USING GORE PROPATEN 41mmx80cm GRAFT;  Surgeon: Elam Dutch, MD;  Location: [place];  Service: Vascular;  Laterality: Left;  . Esophagogastroduodenoscopy (egd) with propofol N/A 05/16/2014    Procedure: ESOPHAGOGASTRODUODENOSCOPY (EGD) WITH PROPOFOL with Balloon dilation;  Surgeon: Milus Banister, MD;  Location: [place];  Service: Endoscopy;  Laterality: N/A;    No Known Allergies  Current Outpatient Prescriptions  Medication Sig Dispense Refill  . ACCU-CHEK AVIVA PLUS test strip     . ACCU-CHEK SOFTCLIX LANCETS lancets     . Alcohol Swabs (B-D SINGLE  USE SWABS REGULAR) PADS     . aspirin 81 MG tablet Take 81 mg by mouth daily.    . clopidogrel (PLAVIX) 75 MG tablet Take 75 mg by mouth daily.     . diphenhydrAMINE (BENADRYL) 25 MG tablet Take 25 mg by mouth every 6 (six) hours as needed for itching or allergies.    . DULoxetine (CYMBALTA) 30 MG capsule Take 30 mg by mouth at bedtime.    . hydrALAZINE (APRESOLINE) 25 MG tablet Take 25 mg by mouth daily.     . hydrochlorothiazide (HYDRODIURIL) 25 MG tablet Take 25 mg by mouth daily.     Marland Kitchen HYDROcodone-acetaminophen (NORCO) 10-325 MG per  tablet Take 1 tablet by mouth every 4 (four) hours as needed (pain). 30 tablet 0  . insulin aspart (NOVOLOG) 100 UNIT/ML injection Inject 5-10 Units into the skin 3 (three) times daily as needed for high blood sugar (cbg over 200). Per sliding scale    . insulin glargine (LANTUS) 100 UNIT/ML injection Inject 80 Units into the skin at bedtime. Dispense syringes and needles in needed, dispense Lantus pen if approved by insurance.    . IRON PO Take 1 tablet by mouth daily.    . lansoprazole (PREVACID) 30 MG capsule Take 1 capsule (30 mg total) by mouth 2 (two) times daily before a meal. 60 capsule 11  . levothyroxine (SYNTHROID, LEVOTHROID) 125 MCG tablet Take 125 mcg by mouth daily before breakfast.    . lisinopril (PRINIVIL,ZESTRIL) 5 MG tablet Take 5 mg by mouth daily.    Marland Kitchen LORazepam (ATIVAN) 1 MG tablet Take 3 mg by mouth at bedtime.    . nortriptyline (PAMELOR) 75 MG capsule Take 75 mg by mouth at bedtime.    . pravastatin (PRAVACHOL) 20 MG tablet Take 20 mg by mouth daily.    Marland Kitchen tolterodine (DETROL LA) 4 MG 24 hr capsule Take 4 mg by mouth daily.     . cyclobenzaprine (FLEXERIL) 10 MG tablet Take 10 mg by mouth at bedtime as needed for muscle spasms.     . [DISCONTINUED] Gabapentin (NEURONTIN PO) Take by mouth. New medicine, pt doesn't remember dosage     No current facility-administered medications for this visit.    ROS: See HPI for pertinent positives and  negatives.   Physical Examination  Filed Vitals:   08/16/14 1119  BP: 95/62  Pulse: 80  Resp: 16  Height: 5\' 1"  (1.549 m)  Weight: 153 lb (69.4 kg)  SpO2: 97%   Body mass index is 28.92 kg/(m^2).  General: A&O x 3, WDWN, obese female.  Gait: ataxic Eyes: PERRLA.  Pulmonary: fine rales in bases, without wheezes , or rhonchi. No dyspnea. Cardiac: regular Rythm , without detected murmur.   Carotid Bruits  Left  Right    Negative  Negative   Aorta is not palpable.  Radial pulses: right is 1+ palpable, left is not present, left brachial pulse is 2+ palpable   VASCULAR EXAM:  Extremities without ischemic changes  without Gangrene; without open wounds. Red blister appearing lesion on dorsal aspect right fifth toe, no drainage.  LE Pulses  LEFT  RIGHT   FEMORAL  2+ palpable  2+ palpable   POPLITEAL  not palpable  not palpable   POSTERIOR TIBIAL  faintly palpable  not palpable   DORSALIS PEDIS  ANTERIOR TIBIAL  1+palpable  1+ palpable    Abdomen: soft, NT, no palpable masses.  Skin: no rashes, no ulcers, see Extremities.  Musculoskeletal: no muscle wasting or atrophy.  Neurologic: A&O X 3; Appropriate Affect; MOTOR FUNCTION: moving all extremities equally, motor strength 5/5 throughout. Speech is slightly slurred. CN 2-12 intact except for uvula deviation to the right.           Non-Invasive Vascular Imaging: DATE: 08/16/2014 LOWER EXTREMITY ARTERIAL DUPLEX EVALUATION    INDICATION: Peripheral vascular disease     PREVIOUS INTERVENTION(S): Left femoropopliteal arterial bypass graft with subsequent occlusion. Left femoropopliteal (below-knee) bypass graft 04/17/2014.    DUPLEX EXAM:     RIGHT  LEFT   Peak Systolic Velocity (cm/s) Ratio (if abnormal) Waveform  Peak Systolic Velocity (cm/s) Ratio (if abnormal) Waveform     Inflow  Artery 139  T      Proximal Anastomosis 126  T     Proximal Graft 68  B     Mid  Graft 53  B      Distal Graft 60  B     Distal Anastomosis 88  B     Outflow Artery 366 4.15 B  0.82 Today's ABI / TBI 1.11  0.76 Previous ABI / TBI (03/07/14  ) 0.68    Waveform:    M - Monophasic       B - Biphasic       T - Triphasic  If Ankle Brachial Index (ABI) or Toe Brachial Index (TBI) performed, please see complete report  ADDITIONAL FINDINGS:     IMPRESSION: Patent left lower extremity bypass graft with no evidence of restenosis within the graft; however, there is a hemodynamically significant stenosis involving the native outflow artery of >70%.     Compared to the previous exam:  First post-operative exam.     ASSESSMENT: April Branch is a 69 y.o. female who is status post left femoropopliteal bypass graft with propaten on 10/12/2011 with subsequent occlusion, left femoral to below-knee popliteal bypass with propaten on 04/17/2014.  She also has a history of carotid artery stenosis.  She has severe ataxia/vertigo, and does not seem to walk enough to elicit claudication symptoms, has no tissue loss in LE's. She does have a spontaneously appearing blister type lesion on her right 5th toe that she denies being caused by irritation from shoe, denies injury; this does not have characteristics of a PAD ulcer. She has no evidence of ischemia in her feet or lower legs.  Today's left LE arterial Duplex suggests a patent left lower extremity bypass graft with no evidence of restenosis within the graft; however, there is a hemodynamically significant stenosis involving the native outflow artery of >70% (366 cm/s). ABI's: right LE improved slightly to mild arterial occlusive disease and left LE improved to normal range from moderate arterial occlusive disease since the left leg bpg in February 2016  She was congratulated on smoking cessation since January 2016 and encouraged to remain smoke free.  Her DM remains uncontrolled which is her primary atherosclerotic risk factor; pt  requested referral to an endocrinologist, see Plan.  Review of lab results on file dating back to the beginning of 2014, no B12 results, pt reports worsening ataxia over the last year, defer to PCP.  Face to face time with patient was 25 minutes. Over 50% of this time was spent on counseling and coordination of care.   PLAN:  Referral to endocrinologist at pt request, Dr. [name]. Referral to podiatrist, [facility], for regular diabetic foot exams and for evaluation and tx of lesion of right 5th toe.  Based on the patient's vascular studies and examination, pt will return to clinic in 3 months with ABI's, left LE arterial Duplex, and carotid Duplex.  I discussed in depth with the patient the nature of atherosclerosis, and emphasized the importance of maximal medical management including strict control of blood pressure, blood glucose, and lipid levels, obtaining regular exercise, and continued cessation of smoking.  The patient is aware that without maximal medical management the underlying atherosclerotic disease process will progress, limiting the benefit of any interventions.  The patient was given information about PAD including signs, symptoms, treatment, what symptoms should prompt the patient to seek immediate medical care, and risk reduction measures to take.  Clemon Chambers, RN, MSN, FNP-C [facility] Phone: [phone]  Clinic MD: Early  08/16/2014 11:33 AM

## 2014-08-16 NOTE — Patient Instructions (Signed)

## 2014-08-17 ENCOUNTER — Telehealth: Payer: Self-pay | Admitting: Family

## 2014-08-20 ENCOUNTER — Other Ambulatory Visit: Payer: Self-pay

## 2014-08-30 ENCOUNTER — Ambulatory Visit: Payer: Medicare Other | Admitting: Podiatry

## 2014-09-04 NOTE — Telephone Encounter (Signed)
111

## 2014-09-26 ENCOUNTER — Ambulatory Visit: Payer: Self-pay | Admitting: Endocrinology

## 2014-09-26 ENCOUNTER — Telehealth: Payer: Self-pay | Admitting: Endocrinology

## 2014-09-26 NOTE — Telephone Encounter (Signed)
Patient no showed today's appt. Please advise on how to follow up. °A. No follow up necessary. °B. Follow up urgent. Contact patient immediately. °C. Follow up necessary. Contact patient and schedule visit in ___ days. °D. Follow up advised. Contact patient and schedule visit in ____weeks. ° °

## 2014-09-28 ENCOUNTER — Ambulatory Visit: Payer: Medicare PPO | Admitting: Endocrinology

## 2014-10-01 ENCOUNTER — Encounter: Payer: Self-pay | Admitting: Endocrinology

## 2014-10-01 ENCOUNTER — Ambulatory Visit (INDEPENDENT_AMBULATORY_CARE_PROVIDER_SITE_OTHER): Payer: Medicare PPO | Admitting: Endocrinology

## 2014-10-01 ENCOUNTER — Other Ambulatory Visit: Payer: Self-pay | Admitting: *Deleted

## 2014-10-01 VITALS — BP 100/55 | HR 90 | Temp 98.2°F | Resp 14 | Ht 61.0 in | Wt 153.0 lb

## 2014-10-01 DIAGNOSIS — E1165 Type 2 diabetes mellitus with hyperglycemia: Secondary | ICD-10-CM | POA: Diagnosis not present

## 2014-10-01 DIAGNOSIS — IMO0002 Reserved for concepts with insufficient information to code with codable children: Secondary | ICD-10-CM

## 2014-10-01 DIAGNOSIS — E038 Other specified hypothyroidism: Secondary | ICD-10-CM | POA: Diagnosis not present

## 2014-10-01 DIAGNOSIS — E785 Hyperlipidemia, unspecified: Secondary | ICD-10-CM | POA: Diagnosis not present

## 2014-10-01 DIAGNOSIS — R55 Syncope and collapse: Secondary | ICD-10-CM | POA: Diagnosis not present

## 2014-10-01 LAB — POCT GLYCOSYLATED HEMOGLOBIN (HGB A1C): HEMOGLOBIN A1C: 11.9

## 2014-10-01 MED ORDER — METFORMIN HCL ER 750 MG PO TB24: 750.0000 mg | ORAL_TABLET | Freq: Every day | ORAL | Status: DC

## 2014-10-01 MED ORDER — INSULIN PEN NEEDLE 32G X 4 MM MISC: Status: DC

## 2014-10-01 MED ORDER — INSULIN GLARGINE 300 UNIT/ML ~~LOC~~ SOPN: 60.0000 [IU] | PEN_INJECTOR | Freq: Two times a day (BID) | SUBCUTANEOUS | Status: DC

## 2014-10-01 NOTE — Progress Notes (Signed)
Patient ID: April Branch, female   DOB: 1946/01/01, 69 y.o.   MRN: [id number]           Reason for Appointment: Consultation for Type 2 Diabetes  Referring physician: Pattricia Boss  History of Present Illness:          Date of diagnosis of type 2 diabetes mellitus :        Background history:    She thinks she was treated with metformin for some time before she went on insulin about 10 years ago   She has mostly been treated with Lantus and rapid acting insulin such as Apidra or Novolog.   She thinks that Apidra worked fairly fast but insurance did not cover this However she thinks her blood sugars always been poorly controlled   Recent history:   INSULIN regimen is described as:   Lantus 80 units at night.  Novolog 10 units 4 times a day     Current blood sugar patterns and problems identified:   she did not bring her Accu-Chek meter but she thinks her blood sugars are usually over 300    she tends to stay thirsty throughout the day and will sometimes drink water but usually drinks up to 36 oz  Regular coke daily      she has usually not being given more than 80 units of Lantus and has not had a dosage increase in some time  Also she has been taking the same dose of Novolog for quite some time   she tries to inject her Novolog before meals as directed but does not adjusted based on what she is eating  She is injecting insulin mostly to the right of her  Umbilicus and does not rotate the sites  Oral hypoglycemic drugs the patient is taking are:  none     Side effects from medications have been: none  Compliance with the medical regimen: fair  Hypoglycemia:  Rare , none recently  Glucose monitoring:  done  times a day         Glucometer: Accucheck      Blood Glucose readings by time of day by recall   PREMEAL Breakfast Lunch Dinner Bedtime  Overall   Glucose range: 250-360 350-400 400+ 400   Median:        Self-care: The diet that the patient has been following is: tries  to limit  High-fat foods.     Typical meal intake: Breakfast is variable, does not always eat breakfast.  Usually has sandwich for lunch               Dietician visit, most recent: never               Exercise:  some slow walking , not able to walk much because of leg pain   Weight history:  Highest 199 , has not lost any weight recently  Wt Readings from Last 3 Encounters:  10/01/14 153 lb (69.4 kg)  08/16/14 153 lb (69.4 kg)  05/16/14 150 lb (68.04 kg)    Glycemic control:   Lab Results  Component Value Date   HGBA1C 11.9 10/01/2014   HGBA1C 7.4* 09/22/2012   HGBA1C 7.8* 05/09/2011   Lab Results  Component Value Date   MICROALBUR 0.96 09/27/2007   LDLCALC 76 02/23/2008   CREATININE 1.00 04/22/2014         Medication List       This list is accurate as of: 10/01/14 12:45 PM.  Always  use your most recent med list.               ACCU-CHEK AVIVA PLUS test strip  Generic drug:  glucose blood     ACCU-CHEK SOFTCLIX LANCETS lancets     aspirin 81 MG tablet  Take 81 mg by mouth daily.     B-D SINGLE USE SWABS REGULAR Pads     clopidogrel 75 MG tablet  Commonly known as:  PLAVIX  Take 75 mg by mouth daily.     cyclobenzaprine 10 MG tablet  Commonly known as:  FLEXERIL  Take 10 mg by mouth at bedtime as needed for muscle spasms.     diphenhydrAMINE 25 MG tablet  Commonly known as:  BENADRYL  Take 25 mg by mouth every 6 (six) hours as needed for itching or allergies.     DULoxetine 30 MG capsule  Commonly known as:  CYMBALTA  Take 30 mg by mouth at bedtime.     hydrALAZINE 25 MG tablet  Commonly known as:  APRESOLINE  Take 25 mg by mouth daily.     hydrochlorothiazide 25 MG tablet  Commonly known as:  HYDRODIURIL  Take 25 mg by mouth daily.     HYDROcodone-acetaminophen 10-325 MG per tablet  Commonly known as:  NORCO  Take 1 tablet by mouth every 4 (four) hours as needed (pain).     insulin aspart 100 UNIT/ML injection  Commonly known as:  novoLOG   Inject 5-10 Units into the skin 3 (three) times daily as needed for high blood sugar (cbg over 200). Per sliding scale     Insulin Glargine 300 UNIT/ML Sopn  Commonly known as:  TOUJEO SOLOSTAR  Inject 60 Units into the skin 2 (two) times daily.     Insulin Pen Needle 32G X 4 MM Misc  Commonly known as:  BD PEN NEEDLE NANO U/F  Use to inject insulin     IRON PO  Take 1 tablet by mouth daily.     lansoprazole 30 MG capsule  Commonly known as:  PREVACID  Take 1 capsule (30 mg total) by mouth 2 (two) times daily before a meal.     levothyroxine 125 MCG tablet  Commonly known as:  SYNTHROID, LEVOTHROID  Take 125 mcg by mouth daily before breakfast.     lisinopril 5 MG tablet  Commonly known as:  PRINIVIL,ZESTRIL  Take 5 mg by mouth daily.     LORazepam 1 MG tablet  Commonly known as:  ATIVAN  Take 3 mg by mouth at bedtime.     metFORMIN 750 MG 24 hr tablet  Commonly known as:  GLUCOPHAGE XR  Take 1 tablet (750 mg total) by mouth daily with breakfast. Take 1 tablet daily for the first week, then increase to 2 tablets daily     metoprolol succinate 25 MG 24 hr tablet  Commonly known as:  TOPROL-XL     nortriptyline 75 MG capsule  Commonly known as:  PAMELOR  Take 75 mg by mouth at bedtime.     pravastatin 20 MG tablet  Commonly known as:  PRAVACHOL  Take 20 mg by mouth daily.     tolterodine 4 MG 24 hr capsule  Commonly known as:  DETROL LA  Take 4 mg by mouth daily.        Allergies: No Known Allergies  Past Medical History  Diagnosis Date  . Hyperlipidemia   . Arthritis   . Hypothyroidism   . GERD (gastroesophageal reflux disease)   .  Anxiety   . 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 Peripheral vascular disease   . Lumbar disc disease   . Insulin dependent type 2 diabetes mellitus, controlled   . GERD 07/20/2006  . CAD (coronary artery disease)   . CAD (coronary artery disease) of bypass graft   . Vertigo   . History of transient ischemic  attack (TIA)   . Myocardial infarction     AGE 89    . Hypertension   . Depression   . COPD 12/14/2008    PATIENT DENIES    . Personal history of malignant neoplasm of kidney(V10.52) 12/14/2008    Laparoscopic biopsy and cryoablation 7/08 Dr. Vernie Shanks   . Headache(784.0)   . Neuromuscular disorder     PERIPHERAL NEUROPATHY  . Anemia   . Diabetic coma Feb. 2014  . Fall at home Jan. 2, 2016  Jan. 13, 2016    Left knee  . Anginal pain     1 MONTH AGO   . Stroke     3 MINI STROKES  RT SIDED WEAKNESS  . Fibromyalgia   . HPV (human papilloma virus) infection   . Colon polyps 09/11/2010    Tubular adenoma  . 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 Appendectomy    . Abdominal hysterectomy    . Knee surgery Bilateral     "cartilage"  . Cholecystectomy    . Lung surgery      lung nodule removed from the right side  . Laparascopic cryoablation of left kidney  08/2006    Dr. Gladis Riffle for renal cell cancer  . Bladder suspension    . Shoulder surgery Bilateral     'Spurs"  . Foot surgery    . Pr vein bypass graft,aorto-fem-pop  05/27/2010  . Cardiac catheterization    . Femoral-popliteal bypass graft  10/12/2011    Procedure: BYPASS GRAFT FEMORAL-POPLITEAL ARTERY;  Surgeon: Elam Dutch, MD;  Location: [facility] OR;  Service: Vascular;  Laterality: Left;  Left Femoral-Popliteal Bypass Graft using 54mm x 80cm Propaten Graft with intraop arteriogram times one.  . Intraoperative arteriogram  10/12/2011    Procedure: INTRA OPERATIVE ARTERIOGRAM;  Surgeon: Elam Dutch, MD;  Location: [facility];  Service: Vascular;  Laterality: Left;  . Left heart catheterization with coronary angiogram N/A 05/11/2011    Procedure: LEFT HEART CATHETERIZATION WITH CORONARY ANGIOGRAM;  Surgeon: Jolaine Artist, MD;  Location: [facility] CATH LAB;  Service: Cardiovascular;  Laterality: N/A;  . Abdominal aortagram N/A 08/21/2011     Procedure: ABDOMINAL Maxcine Ham;  Surgeon: Elam Dutch, MD;  Location: [facility] CATH LAB;  Service: Cardiovascular;  Laterality: N/A;  . Abdominal aortagram N/A 03/16/2014    Procedure: ABDOMINAL Maxcine Ham;  Surgeon: Elam Dutch, MD;  Location: [facility] CATH LAB;  Service: Cardiovascular;  Laterality: N/A;  . Femoral-popliteal bypass graft Left 04/17/2014    Procedure: REDO LEFT FEMORAL-POPLITEAL ARTERY BYPASS USING GORE PROPATEN 75mmx80cm GRAFT;  Surgeon: Elam Dutch, MD;  Location: [facility];  Service: Vascular;  Laterality: Left;  . Esophagogastroduodenoscopy (egd) with propofol N/A 05/16/2014    Procedure: ESOPHAGOGASTRODUODENOSCOPY (EGD) WITH PROPOFOL with Balloon dilation;  Surgeon: Milus Banister, MD;  Location: [facility];  Service: Endoscopy;  Laterality: N/A;    Family History  Problem Relation Age of Onset  . Heart disease Mother   . Lung cancer Mother   .  Cancer Mother     Lung  . Heart disease Father   . Lung cancer Father   . Cancer Father     Lung  . Heart disease Sister     CABG- Open Heart    Social History:  reports that she quit smoking about 6 months ago. Her smoking use included Cigarettes. She has a 12.5 pack-year smoking history. She has never used smokeless tobacco. She reports that she does not drink alcohol or use illicit drugs.    Review of Systems    Lipid history:  She had been given pravastatin by a cardiologist , not clear if she is taking this   Lab Results  Component Value Date   CHOL 144 02/23/2008   HDL 38* 02/23/2008   LDLCALC 76 02/23/2008   TRIG 198* 06/05/2010   CHOLHDL 3.8 Ratio 02/23/2008           Constitutional: no recent weight gain/loss.  She tends to get tired easily  Eyes: history of blurred vision  episodicallywith tendency to falling and near syncope , she thinks these episodes last for about 2 minutes Most recent eye exam was  Over a year ago  ENT: no nasal congestion,  She has occasional difficulty  swallowing  Cardiovascular: no chest pain or tightness on exertion.   She has been taking metoprolol since her reportedly having a light heart attack Has had leg swelling at times.    Hypertension: she has been diagnosed to have hypertension and is taking HCTZ, hydralazine once a day and low-dose lisinopril.  Respiratory: no cough/shortness of breath  Gastrointestinal: no constipation, diarrhea, nausea or abdominal pain  Musculoskeletal: no muscle/joint aches.  Tends to have pains in her feet on walking   Urological:  She has had previous issues with bladder continence, does have some frequent urination including at night  Skin: no rash or infections  Neurological:    Has no numbness, burning but does have pains and some tingling in feet.  Her feet are sensitive to touch.  She is being treated with nortriptyline with only some relief   She thinks she has had a history of light strokes and is taking Plavix  Psychiatric: symptoms of depression treated with Cymbalta 2-3 years  Endocrine: No unusual fatigue, cold intolerance or history of thyroid disease   LABS:  Office Visit on 10/01/2014  Component Date Value Ref Range Status  . Hemoglobin A1C 10/01/2014 11.9   Final    Physical Examination:  BP 100/55 mmHg  Pulse 90  Temp(Src) 98.2 F (36.8 C)  Resp 14  Ht 5\' 1"  (1.549 m)  Wt 153 lb (69.4 kg)  BMI 28.92 kg/m2  SpO2 97%  GENERAL:  she is averagely built and nourished    HEENT:         Eye exam shows normal external appearance. Fundus exam shows no retinopathy. Oral exam shows normal mucosa .  NECK:   mild acanthosis present. There is no lymphadenopathy Thyroid is not enlarged and no nodules felt.  Carotids are normal to palpation.  Soft short right bruit heard, left side normal LUNGS:         Chest is symmetrical. Lungs are clear to auscultation.Marland Kitchen   HEART:         Heart sounds:  S1 and S2 are normal. No murmurs or clicks heard., no S3 or S4.   ABDOMEN:   There is no  distention present. Liver and spleen are not palpable. No other mass or tenderness  present.   NEUROLOGICAL:   Vibration sense is  markedly reduced in distal first toes. Ankle jerks are absent bilaterally.          Monofilament sensation is markedly reduced across most of the toes and distal plantar surfaces MUSCULOSKELETAL:  There is no swelling or deformity of the peripheral joints. Spine is normal to inspection.   EXTREMITIES:     There is no edema. No skin lesions present.Marland Kitchen SKIN:       No rash or lesions of concern.  examination of feet and plantar surfaces is normal       ASSESSMENT:  Diabetes type 2, uncontrolled    Patient has markedly increased blood sugars with long history of diabetes treated with insulin alone She appears to be also insulin resistant with taking 120 units of insulin and continued hyperglycemia   Exam indicates mild acanthosis.  She is however consuming significant amount of sucrose in the form of regular soft drinks daily.   Also has minimal knowledge about insulin adjustment, insulin site rotation , meal planning and basic diabetes knowledge.  Complications: peripheral neuropathy.  Unknown status of microalbumin or eye examsper She has had macrovascular disease including peripheral vascular disease, probable CAD and may also havecarotid vascular disease    NEUROPATHY: She is still having symptomatic discomfort and currently only taking low dose Cymbalta and nortriptyline    Reportedly she has a history of hypertension but her blood pressure is relatively low today and she may be symptomatic with orthostatic dizziness and near-syncope.  HYPERLIPIDEMIA: Relatively mild but no recent lipid levels available.  Also need to confirm that she is taking pravastatin; with her history of vascular disease she does need to be on a high intensity statin instead of just 20 mg pravastatin  HYPOTHYROID: Her last TSH was normal, will need periodic monitoring   PLAN:    switch  Lantus to Toujeo and increase the dose to 100 units a day , can take this in split doses since she cannot take more than 80 units with 1 injection at a time   Increase Novolog by at least 10 units , she will take 25 units at suppertime.   Consultation with diabetes nurse educator in 1 week.  Increase exercise.  Stop drinking regular soft drinks  Consultation with dietitian may be needed  Because of her orthostatic low blood pressure and symptoms she will stop her antihypertensives for now and also HCTZ; she can take HCTZ only 12.5 mg as needed for edema  Follow-up in 3 weeks.  Encouraged her to start reducing and eventually quit smoking.  She will bring her glucose monitor for download on each visit, discussed blood sugar targets and timing of monitoring.  She will rotate her injection sites as discussed   Patient Instructions   STOP drinking regular soft drinks and sweetened tea, may use artificially sweetened drinks such as Crystal light  Check blood sugars on waking up .Marland Kitchen 3-4  .Marland Kitchen times a week Also check blood sugars about 2 hours after a meal and do this after different meals by rotation  Recommended blood sugar levels on waking up is 90-130 and about 2 hours after meal is 140-180 Please bring blood sugar monitor to each visit.  Lantus: Start taking 50 units twice a day at breakfast and bedtime TOUJEO: This will replace Lantus and  can continue taking 50 units twice a day  If the blood sugar in the morning is still over 200 after one week increased your  evening dose to 60 units  NOVOLOG: Increase the dose to 20 units at breakfast, 20 units at lunch and 25 at suppertime  Stop taking hydralazine, lisinopril and hydrochlorothiazide. May take a half tablet of hydrochlorothiazide only as needed for swelling  Start reducing smoking  Increase CYMBALTA to 2 capsules of 30 mg   Counseling time on subjects discussed above is over 50% of today's 60 minute  visit   Juvon Teater 10/01/2014, 12:45 PM   Note: This office note was prepared with Estate agent. Any transcriptional errors that result from this process are unintentional.

## 2014-10-01 NOTE — Patient Instructions (Addendum)
STOP drinking regular soft drinks and sweetened tea, may use artificially sweetened drinks such as Crystal light  Check blood sugars on waking up .Marland Kitchen 3-4  .Marland Kitchen times a week Also check blood sugars about 2 hours after a meal and do this after different meals by rotation  Recommended blood sugar levels on waking up is 90-130 and about 2 hours after meal is 140-180 Please bring blood sugar monitor to each visit.  Lantus: Start taking 50 units twice a day at breakfast and bedtime TOUJEO: This will replace Lantus and  can continue taking 50 units twice a day  If the blood sugar in the morning is still over 200 after one week increased your evening dose to 60 units  NOVOLOG: Increase the dose to 20 units at breakfast, 20 units at lunch and 25 at suppertime  Stop taking hydralazine, lisinopril and hydrochlorothiazide. May take a half tablet of hydrochlorothiazide only as needed for swelling  Start reducing smoking  Increase CYMBALTA to 2 capsules of 30 mg

## 2014-10-10 ENCOUNTER — Ambulatory Visit: Payer: Medicare Other | Admitting: Podiatry

## 2014-10-16 ENCOUNTER — Telehealth: Payer: Self-pay | Admitting: Nutrition

## 2014-10-16 ENCOUNTER — Encounter: Payer: Medicare PPO | Attending: Endocrinology | Admitting: Nutrition

## 2014-10-16 DIAGNOSIS — Z713 Dietary counseling and surveillance: Secondary | ICD-10-CM | POA: Insufficient documentation

## 2014-10-16 DIAGNOSIS — Z794 Long term (current) use of insulin: Secondary | ICD-10-CM | POA: Diagnosis not present

## 2014-10-16 DIAGNOSIS — E119 Type 2 diabetes mellitus without complications: Secondary | ICD-10-CM | POA: Diagnosis present

## 2014-10-16 NOTE — Patient Instructions (Signed)
1.  Switch to Stevia instead of sugar in the coffee Test blood sugar 2hr. pc cereal and if over 200, reduce the amount. Toujeo 20u once a day. Metformin twice a day Call if blood sugars drop low, or if they are consistently over 200

## 2014-10-16 NOTE — Telephone Encounter (Signed)
Detailed message left on patient's voicemail.

## 2014-10-16 NOTE — Progress Notes (Signed)
Patient reports that since starting Toujeo, her FBSs have been low.  50s,.  Yesterday she took only 30u of Toujeo in the AM, ate 2 donuts, drank 2 12ounce cans of Coke, and had not taken any Novolog since starting Toujeo.  She took no Toujeo last night, and FBS today was 71.  She took one Metformin today.  She varies her Toujeo dose based on blood sugar readings.   Plan:  Per Dr. Ronnie Derby voice order, that was reverbalized to him, we are reducing her Toujeo dose to 20u once a day, and to continue to Metformin BID.  Written instructions were given for this.   Typical day: 6AM up, coffee with 2 tsp. Of sugar 9-10AM breakfast of Cherios and milk 2PM: lunch of sandwich-balogna with Fritos, and 8 ounces of milk 8-10PM: sometimes a sandwich, sometimes something sweet--cookies.    Suggestions given:  Samples of Stevia given to her for her coffee and cereal.  Test blood sugar 2hr. pc cereal and if over 200, reduce the amount. Toujeo 20u once a day. Metformin twice a day Call if blood sugars drop low, or if they are consistently over 200

## 2014-10-16 NOTE — Telephone Encounter (Signed)
She can start taking her hydrochlorothiazide, 1 tablet daily for 3 days and then half tablet daily

## 2014-10-16 NOTE — Telephone Encounter (Signed)
This patient took 30u of Paula Compton (is only taking this once a day), had 2 12ounce cans of Coke and 2 donuts, and FBS today was 71.  She did not bring her meter, but says FBSs have been in the 50s and low 60 since starting on Toujeo.  She has taken no Novolog since starting Toujeo.  She took her Metformin this AM, and took no Toujeo This AM.  Per Dr. Ronnie Derby voice order, she was told to take 20u of Toujeo once a day and remain on her Meformin.  She re verbalized this to me and written instructions were given to her for this.    Her weight is up to 164 today.  She says that her feet and ankles swell by night time.  No swelling noted in ankles this AM.  She says she was told to stop 2 pills ( her BP pill ans something else), and is wondering if she should go back on this.

## 2014-10-25 ENCOUNTER — Other Ambulatory Visit (INDEPENDENT_AMBULATORY_CARE_PROVIDER_SITE_OTHER): Payer: Medicare PPO

## 2014-10-25 DIAGNOSIS — E038 Other specified hypothyroidism: Secondary | ICD-10-CM

## 2014-10-25 DIAGNOSIS — E1165 Type 2 diabetes mellitus with hyperglycemia: Secondary | ICD-10-CM | POA: Diagnosis not present

## 2014-10-25 DIAGNOSIS — E785 Hyperlipidemia, unspecified: Secondary | ICD-10-CM

## 2014-10-25 DIAGNOSIS — IMO0002 Reserved for concepts with insufficient information to code with codable children: Secondary | ICD-10-CM

## 2014-10-25 LAB — LIPID PANEL
CHOLESTEROL: 130 mg/dL (ref 0–200)
HDL: 41 mg/dL (ref >39.00)
NonHDL: 88.84
Total CHOL/HDL Ratio: 3
Triglycerides: 222 mg/dL — ABNORMAL HIGH (ref 0.0–149.0)
VLDL: 44.4 mg/dL — ABNORMAL HIGH (ref 0.0–40.0)

## 2014-10-25 LAB — BASIC METABOLIC PANEL
BUN: 18 mg/dL (ref 6–23)
CO2: 24 mEq/L (ref 19–32)
Calcium: 8.6 mg/dL (ref 8.4–10.5)
Chloride: 100 mEq/L (ref 96–112)
Creatinine, Ser: 1.35 mg/dL — ABNORMAL HIGH (ref 0.40–1.20)
GFR: 41.54 mL/min — AB (ref >60.00)
Glucose, Bld: 168 mg/dL — ABNORMAL HIGH (ref 70–99)
Potassium: 3.9 mEq/L (ref 3.5–5.1)
SODIUM: 136 meq/L (ref 135–145)

## 2014-10-25 LAB — MICROALBUMIN / CREATININE URINE RATIO
CREATININE, U: 104.8 mg/dL
MICROALB UR: 1 mg/dL (ref 0.0–1.9)
Microalb Creat Ratio: 1 mg/g (ref 0.0–30.0)

## 2014-10-25 LAB — TSH: TSH: 7.85 u[IU]/mL — ABNORMAL HIGH (ref 0.35–4.50)

## 2014-10-25 LAB — T4, FREE: FREE T4: 0.96 ng/dL (ref 0.60–1.60)

## 2014-10-25 LAB — LDL CHOLESTEROL, DIRECT: LDL DIRECT: 61 mg/dL

## 2014-10-30 ENCOUNTER — Other Ambulatory Visit: Payer: Self-pay | Admitting: *Deleted

## 2014-10-30 ENCOUNTER — Telehealth: Payer: Self-pay | Admitting: Endocrinology

## 2014-10-30 ENCOUNTER — Encounter: Payer: Self-pay | Admitting: Endocrinology

## 2014-10-30 ENCOUNTER — Ambulatory Visit (INDEPENDENT_AMBULATORY_CARE_PROVIDER_SITE_OTHER): Payer: Medicare PPO | Admitting: Endocrinology

## 2014-10-30 VITALS — BP 115/60 | HR 83 | Temp 97.6°F | Resp 14 | Ht 61.0 in | Wt 163.0 lb

## 2014-10-30 DIAGNOSIS — E038 Other specified hypothyroidism: Secondary | ICD-10-CM

## 2014-10-30 DIAGNOSIS — E1165 Type 2 diabetes mellitus with hyperglycemia: Secondary | ICD-10-CM | POA: Diagnosis not present

## 2014-10-30 DIAGNOSIS — E1142 Type 2 diabetes mellitus with diabetic polyneuropathy: Secondary | ICD-10-CM

## 2014-10-30 DIAGNOSIS — IMO0002 Reserved for concepts with insufficient information to code with codable children: Secondary | ICD-10-CM

## 2014-10-30 DIAGNOSIS — G629 Polyneuropathy, unspecified: Secondary | ICD-10-CM

## 2014-10-30 DIAGNOSIS — E785 Hyperlipidemia, unspecified: Secondary | ICD-10-CM

## 2014-10-30 MED ORDER — INSULIN PEN NEEDLE 32G X 4 MM MISC: Status: AC

## 2014-10-30 MED ORDER — LEVOTHYROXINE SODIUM 137 MCG PO TABS: 137.0000 ug | ORAL_TABLET | Freq: Every day | ORAL | Status: DC

## 2014-10-30 MED ORDER — INSULIN ASPART 100 UNIT/ML ~~LOC~~ SOLN: 20.0000 [IU] | Freq: Three times a day (TID) | SUBCUTANEOUS | Status: DC

## 2014-10-30 NOTE — Telephone Encounter (Signed)
Pt was here this am and said that Dr. [name] was suppose to call in synthorid with the dosage and she says it is not called in, please call pt back

## 2014-10-30 NOTE — Telephone Encounter (Signed)
It was just sent in.

## 2014-10-30 NOTE — Patient Instructions (Addendum)
Check blood sugars on waking up ..  .. times a week Also check blood sugars about 2 hours after a meal and do this after different meals by rotation Recommended blood sugar levels on waking up is 90-130 and about 2 hours after meal is 140-180 Please bring blood sugar monitor to each visit.  Toujeo 80 units in am only  Novolog 15 units for small meals and 20 for usual meals  Take 2 Cymbalta at bedtime  Call if Cymbalta not helping  New dose of Synthroid 137ug  Take fluid pill q 2 days

## 2014-10-30 NOTE — Progress Notes (Signed)
Patient ID: April Branch, female   DOB: 1945-12-07, 69 y.o.   MRN: [id number]           Reason for Appointment: Follow-up  for Type 2 Diabetes  Referring physician: Pattricia Boss  History of Present Illness:          Date of diagnosis of type 2 diabetes mellitus: 1980s       Previous history:    She thinks she was treated with metformin for some time before she went on insulin in ?  2002   She has mostly been treated with Lantus and rapid acting insulin such as Apidra or Novolog.   She thinks that Apidra worked fairly fast but insurance did not cover this, generally has had poor control   Recent history:   INSULIN regimen is described as:   Toujeo 60 units twice a day, no Novolog   On her last visit she was switched from Lantus to Toujeo twice a day.  Prior to her initial consultation she reported blood sugars of mostly over 300 throughout the day especially non-fasting She was also given instructions on taking Novolog higher doses than the 10 units she was taking but she misunderstood and stopped taking this completely     Current blood sugar patterns and problems identified:   she  does check her sugar about 4 times a day regularly at various times  FASTING blood sugars are now starting to get low normal with a couple of readings of 59 also  She now says that she is not eating much at suppertime compared to before and is eating breakfast  Blood sugars are progressively higher during the day and may come down late in the evenings when she does not eat  Blood sugars are frequently over 200 after meals, highest 342 recently  She is not able to do much exercise although she is trying to eat as active as possible  She did start rotating her injection sites as discussed on the last visit but is complaining about bruising  Oral hypoglycemic drugs the patient is taking are:  none     Side effects from medications have been: none  Compliance with the medical regimen: fair    Hypoglycemia:  Rare , none recently  Glucose monitoring:  done  4  times a day         Glucometer: Accucheck      Blood Glucose readings by   Mean values apply above for all meters except median for One Touch  PRE-MEAL Fasting  1-3 PM  Dinner Bedtime Overall  Glucose range:  59-156    67-230    Mean/median:  91   214   160 169  166   Self-care: The diet that the patient has been following is: tries to limit  High-fat foods.     Typical meal intake: Breakfast is variable, does not always eat supper.  Usually has sandwich for lunch               Dietician visit, most recent: never               Exercise:  some slow walking , not able to walk much because of leg pain   Weight history:  Highest 199 , has not lost any weight recently  Wt Readings from Last 3 Encounters:  10/30/14 163 lb (73.936 kg)  10/01/14 153 lb (69.4 kg)  08/16/14 153 lb (69.4 kg)    Glycemic control:   Lab Results  Component Value Date   HGBA1C 11.9 10/01/2014   HGBA1C 7.4* 09/22/2012   HGBA1C 7.8* 05/09/2011   Lab Results  Component Value Date   MICROALBUR 1.0 10/25/2014   LDLCALC 76 02/23/2008   CREATININE 1.35* 10/25/2014         Medication List       This list is accurate as of: 10/30/14 10:41 AM.  Always use your most recent med list.               ACCU-CHEK AVIVA PLUS test strip  Generic drug:  glucose blood     ACCU-CHEK SOFTCLIX LANCETS lancets     aspirin 81 MG tablet  Take 81 mg by mouth daily.     B-D SINGLE USE SWABS REGULAR Pads     clopidogrel 75 MG tablet  Commonly known as:  PLAVIX  Take 75 mg by mouth daily.     cyclobenzaprine 10 MG tablet  Commonly known as:  FLEXERIL  Take 10 mg by mouth at bedtime as needed for muscle spasms.     diphenhydrAMINE 25 MG tablet  Commonly known as:  BENADRYL  Take 25 mg by mouth every 6 (six) hours as needed for itching or allergies.     DULoxetine 30 MG capsule  Commonly known as:  CYMBALTA  Take 30 mg by mouth at bedtime.      hydrochlorothiazide 25 MG tablet  Commonly known as:  HYDRODIURIL  Take 25 mg by mouth daily. Takes 1/2 tablet daily     HYDROcodone-acetaminophen 10-325 MG per tablet  Commonly known as:  NORCO  Take 1 tablet by mouth every 4 (four) hours as needed (pain).     insulin aspart 100 UNIT/ML injection  Commonly known as:  novoLOG  Inject 20 Units into the skin 3 (three) times daily with meals.     Insulin Glargine 300 UNIT/ML Sopn  Commonly known as:  TOUJEO SOLOSTAR  Inject 60 Units into the skin 2 (two) times daily.     Insulin Pen Needle 32G X 4 MM Misc  Commonly known as:  BD PEN NEEDLE NANO U/F  Use to inject insulin     IRON PO  Take 1 tablet by mouth daily.     lansoprazole 30 MG capsule  Commonly known as:  PREVACID  Take 1 capsule (30 mg total) by mouth 2 (two) times daily before a meal.     levothyroxine 125 MCG tablet  Commonly known as:  SYNTHROID, LEVOTHROID  Take 125 mcg by mouth daily before breakfast.     LORazepam 1 MG tablet  Commonly known as:  ATIVAN  Take 3 mg by mouth at bedtime.     metFORMIN 750 MG 24 hr tablet  Commonly known as:  GLUCOPHAGE XR  Take 1 tablet (750 mg total) by mouth daily with breakfast. Take 1 tablet daily for the first week, then increase to 2 tablets daily     metoprolol succinate 25 MG 24 hr tablet  Commonly known as:  TOPROL-XL     nortriptyline 75 MG capsule  Commonly known as:  PAMELOR  Take 75 mg by mouth at bedtime.     pravastatin 20 MG tablet  Commonly known as:  PRAVACHOL  Take 20 mg by mouth daily.     tolterodine 4 MG 24 hr capsule  Commonly known as:  DETROL LA  Take 4 mg by mouth daily.        Allergies: No Known Allergies  Past Medical History  Diagnosis Date  . Hyperlipidemia   . Arthritis   . Hypothyroidism   . GERD (gastroesophageal reflux disease)   . Anxiety   . Diabetes mellitus age 24    insulin dependent  . Peripheral vascular disease   . Lumbar disc disease   . Insulin dependent  type 2 diabetes mellitus, controlled   . GERD 07/20/2006  . CAD (coronary artery disease)   . CAD (coronary artery disease) of bypass graft   . Vertigo   . History of transient ischemic attack (TIA)   . Myocardial infarction     AGE 10    . Hypertension   . Depression   . COPD 12/14/2008    PATIENT DENIES    . Personal history of malignant neoplasm of kidney(V10.52) 12/14/2008    Laparoscopic biopsy and cryoablation 7/08 Dr. Vernie Shanks   . Headache(784.0)   . Neuromuscular disorder     PERIPHERAL NEUROPATHY  . Anemia   . Diabetic coma Feb. 2014  . Fall at home Jan. 2, 2016  Jan. 13, 2016    Left knee  . Anginal pain     1 MONTH AGO   . Stroke     3 MINI STROKES  RT SIDED WEAKNESS  . Fibromyalgia   . HPV (human papilloma virus) infection   . Colon polyps 09/11/2010    Tubular adenoma  . 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 Appendectomy    . Abdominal hysterectomy    . Knee surgery Bilateral     "cartilage"  . Cholecystectomy    . Lung surgery      lung nodule removed from the right side  . Laparascopic cryoablation of left kidney  08/2006    Dr. Gladis Riffle for renal cell cancer  . Bladder suspension    . Shoulder surgery Bilateral     'Spurs"  . Foot surgery    . Pr vein bypass graft,aorto-fem-pop  05/27/2010  . Cardiac catheterization    . Femoral-popliteal bypass graft  10/12/2011    Procedure: BYPASS GRAFT FEMORAL-POPLITEAL ARTERY;  Surgeon: Elam Dutch, MD;  Location: [facility] OR;  Service: Vascular;  Laterality: Left;  Left Femoral-Popliteal Bypass Graft using 94mm x 80cm Propaten Graft with intraop arteriogram times one.  . Intraoperative arteriogram  10/12/2011    Procedure: INTRA OPERATIVE ARTERIOGRAM;  Surgeon: Elam Dutch, MD;  Location: [place];  Service: Vascular;  Laterality: Left;  . Left heart catheterization with coronary angiogram N/A 05/11/2011    Procedure: LEFT HEART  CATHETERIZATION WITH CORONARY ANGIOGRAM;  Surgeon: Jolaine Artist, MD;  Location: [facility] CATH LAB;  Service: Cardiovascular;  Laterality: N/A;  . Abdominal aortagram N/A 08/21/2011    Procedure: ABDOMINAL Maxcine Ham;  Surgeon: Elam Dutch, MD;  Location: [facility] CATH LAB;  Service: Cardiovascular;  Laterality: N/A;  . Abdominal aortagram N/A 03/16/2014    Procedure: ABDOMINAL Maxcine Ham;  Surgeon: Elam Dutch, MD;  Location: [facility] CATH LAB;  Service: Cardiovascular;  Laterality: N/A;  . Femoral-popliteal bypass graft Left 04/17/2014    Procedure: REDO LEFT FEMORAL-POPLITEAL ARTERY BYPASS USING GORE PROPATEN 42mmx80cm GRAFT;  Surgeon: Elam Dutch, MD;  Location: [place];  Service: Vascular;  Laterality: Left;  . Esophagogastroduodenoscopy (egd) with propofol N/A 05/16/2014    Procedure: ESOPHAGOGASTRODUODENOSCOPY (EGD) WITH PROPOFOL with Balloon dilation;  Surgeon: Milus Banister, MD;  Location: [place];  Service: Endoscopy;  Laterality:  N/A;    Family History  Problem Relation Age of Onset  . Heart disease Mother   . Lung cancer Mother   . Cancer Mother     Lung  . Heart disease Father   . Lung cancer Father   . Cancer Father     Lung  . Heart disease Sister     CABG- Open Heart    Social History:  reports that she quit smoking about 7 months ago. Her smoking use included Cigarettes. She has a 12.5 pack-year smoking history. She has never used smokeless tobacco. She reports that she does not drink alcohol or use illicit drugs.    Review of Systems    Lipid history:  She has good control with taking pravastatin but has relatively high triglycerides    Lab Results  Component Value Date   CHOL 130 10/25/2014   HDL 41.00 10/25/2014   LDLCALC 76 02/23/2008   LDLDIRECT 61.0 10/25/2014   TRIG 222.0* 10/25/2014   CHOLHDL 3 10/25/2014           Constitutional: .  She still  tends to get tired easily  Eyes: she is overdue for eye exam   Hypertension: she is taking HCTZ 12.5  mg.  Previously was on  hydralazine once a day and low-dose lisinopril. these were stopped because of relatively low blood pressure on her initial consultation.  She did start back on HCTZ because of swelling   Neurological:    Has no numbness, burning but does have pains , burning and some tingling in feet.   does not think she has tried gabapentin but is taking Cymbalta 30 mg She is being treated with nortriptyline with only some relief  Long history of thyroid disease, Has had post ablative hypothyroidism   Lab Results  Component Value Date   TSH 7.85* 10/25/2014      LABS:  Appointment on 10/25/2014  Component Date Value Ref Range Status  . Sodium 10/25/2014 136  135 - 145 mEq/L Final  . Potassium 10/25/2014 3.9  3.5 - 5.1 mEq/L Final  . Chloride 10/25/2014 100  96 - 112 mEq/L Final  . CO2 10/25/2014 24  19 - 32 mEq/L Final  . Glucose, Bld 10/25/2014 168* 70 - 99 mg/dL Final  . BUN 10/25/2014 18  6 - 23 mg/dL Final  . Creatinine, Ser 10/25/2014 1.35* 0.40 - 1.20 mg/dL Final  . Calcium 10/25/2014 8.6  8.4 - 10.5 mg/dL Final  . GFR 10/25/2014 41.54* >60.00 mL/min Final  . Cholesterol 10/25/2014 130  0 - 200 mg/dL Final   ATP III Classification       Desirable:  < 200 mg/dL               Borderline High:  200 - 239 mg/dL          High:  > = 240 mg/dL  . Triglycerides 10/25/2014 222.0* 0.0 - 149.0 mg/dL Final   Normal:  <150 mg/dLBorderline High:  150 - 199 mg/dL  . HDL 10/25/2014 41.00  >39.00 mg/dL Final  . VLDL 10/25/2014 44.4* 0.0 - 40.0 mg/dL Final  . Total CHOL/HDL Ratio 10/25/2014 3   Final                  Men          Women1/2 Average Risk     3.4          3.3Average Risk  5.0          4.42X Average Risk          9.6          7.13X Average Risk          15.0          11.0                      . NonHDL 10/25/2014 88.84   Final   NOTE:  Non-HDL goal should be 30 mg/dL higher than patient's LDL goal (i.e. LDL goal of < 70 mg/dL, would have non-HDL goal of < 100  mg/dL)  . Microalb, Ur 10/25/2014 1.0  0.0 - 1.9 mg/dL Final  . Creatinine,U 10/25/2014 104.8   Final  . Microalb Creat Ratio 10/25/2014 1.0  0.0 - 30.0 mg/g Final  . TSH 10/25/2014 7.85* 0.35 - 4.50 uIU/mL Final  . Free T4 10/25/2014 0.96  0.60 - 1.60 ng/dL Final  . Direct LDL 10/25/2014 61.0   Final   Optimal:  <100 mg/dLNear or Above Optimal:  100-129 mg/dLBorderline High:  130-159 mg/dLHigh:  160-189 mg/dLVery High:  >190 mg/dL   Monofilament sensation is markedly reduced across most of the toes and distal plantar surfaces  Physical Examination:  BP 115/60 mmHg  Pulse 83  Temp(Src) 97.6 F (36.4 C)  Resp 14  Ht 5\' 1"  (1.549 m)  Wt 163 lb (73.936 kg)  BMI 30.81 kg/m2  SpO2 99%       ASSESSMENT:  Diabetes type 2, uncontrolled    Patient has  significant improvement in her blood sugars with using Toujeo insulin instead of Lantus Surprisingly even though she is not taking Novolog her average blood sugar is better Currently having postprandial hyperglycemia; explained basal bolus regimen and need for covering meals with rapid acting insulin Discussed adjusting insulin doses based on either fasting or postprandial readings She is probably getting excessive amounts of basal insulin with relatively low fasting readings now   Discussed that because of her blood sugars being significantly better or weight may have gone up   NEUROPATHY: She is still having symptomatic discomfort and currently only taking low dose Cymbalta and nortriptyline Recommended trying 60 mg of Cymbalta and if not better she can try gabapentin also   HYPERLIPIDEMIA: she does need to be on a high intensity statin instead of just 20 mg pravastatin  HYPOTHYROID: Her last TSH  is high and she will will need  to increase the dose to 137 g; she has been compliant with her medication   History of hypertension: Her blood pressure readings are relatively low again and she also has low normal reading standing up.   Because of her relatively high creatinine we would like to reduce her HCTZ and possibly stop it; currently back on it because of reported edema  PLAN:    restart Novolog as directed, instructions below  Change Toujeo to 80 units in the morning only  Adjust mealtime doses based on meal size  Increase Cymbalta  Increase Synthroid  Try to reduce HCTZ to every other day    Patient Instructions  Check blood sugars on waking up ..  .. times a week Also check blood sugars about 2 hours after a meal and do this after different meals by rotation Recommended blood sugar levels on waking up is 90-130 and about 2 hours after meal is 140-180 Please bring blood sugar monitor to each visit.  Toujeo 80 units in  am only  Novolog 15 units for small meals and 20 for usual meals  Take 2 Cymbalta at bedtime  Call if Cymbalta not helping  New dose of Synthroid 137ug  Take fluid pill q 2 days   Counseling time on subjects discussed above is over 50% of today's 25 minute visit    Waylen Depaolo 10/30/2014, 10:41 AM   Note: This office note was prepared with Estate agent. Any transcriptional errors that result from this process are unintentional.

## 2014-11-08 ENCOUNTER — Other Ambulatory Visit (HOSPITAL_COMMUNITY): Payer: Self-pay

## 2014-11-08 ENCOUNTER — Ambulatory Visit: Payer: Self-pay | Admitting: Family

## 2014-11-08 ENCOUNTER — Encounter (HOSPITAL_COMMUNITY): Payer: Self-pay

## 2014-11-20 ENCOUNTER — Encounter: Payer: Self-pay | Admitting: Family

## 2014-11-22 ENCOUNTER — Ambulatory Visit (INDEPENDENT_AMBULATORY_CARE_PROVIDER_SITE_OTHER): Payer: Medicare PPO | Admitting: Family

## 2014-11-22 ENCOUNTER — Ambulatory Visit (INDEPENDENT_AMBULATORY_CARE_PROVIDER_SITE_OTHER)
Admission: RE | Admit: 2014-11-22 | Discharge: 2014-11-22 | Disposition: A | Discharge status: Home / Self Care | Payer: Medicare PPO | Source: Ambulatory Visit | Attending: Family | Admitting: Family

## 2014-11-22 ENCOUNTER — Encounter: Payer: Self-pay | Admitting: Family

## 2014-11-22 ENCOUNTER — Ambulatory Visit (HOSPITAL_COMMUNITY)
Admission: RE | Admit: 2014-11-22 | Discharge: 2014-11-22 | Disposition: A | Discharge status: Home / Self Care | Payer: Medicare PPO | Source: Ambulatory Visit | Attending: Family | Admitting: Family

## 2014-11-22 VITALS — BP 142/88 | HR 73 | Temp 98.1°F | Resp 16 | Ht 61.0 in | Wt 168.0 lb

## 2014-11-22 DIAGNOSIS — E1159 Type 2 diabetes mellitus with other circulatory complications: Secondary | ICD-10-CM

## 2014-11-22 DIAGNOSIS — E1151 Type 2 diabetes mellitus with diabetic peripheral angiopathy without gangrene: Secondary | ICD-10-CM

## 2014-11-22 DIAGNOSIS — I739 Peripheral vascular disease, unspecified: Secondary | ICD-10-CM | POA: Insufficient documentation

## 2014-11-22 DIAGNOSIS — Z87891 Personal history of nicotine dependence: Secondary | ICD-10-CM | POA: Diagnosis not present

## 2014-11-22 DIAGNOSIS — I779 Disorder of arteries and arterioles, unspecified: Secondary | ICD-10-CM | POA: Insufficient documentation

## 2014-11-22 DIAGNOSIS — IMO0002 Reserved for concepts with insufficient information to code with codable children: Secondary | ICD-10-CM

## 2014-11-22 DIAGNOSIS — E785 Hyperlipidemia, unspecified: Secondary | ICD-10-CM | POA: Diagnosis not present

## 2014-11-22 DIAGNOSIS — Z9889 Other specified postprocedural states: Secondary | ICD-10-CM

## 2014-11-22 DIAGNOSIS — E1165 Type 2 diabetes mellitus with hyperglycemia: Secondary | ICD-10-CM

## 2014-11-22 DIAGNOSIS — S90424A Blister (nonthermal), right lesser toe(s), initial encounter: Secondary | ICD-10-CM

## 2014-11-22 DIAGNOSIS — I6523 Occlusion and stenosis of bilateral carotid arteries: Secondary | ICD-10-CM

## 2014-11-22 DIAGNOSIS — Z95828 Presence of other vascular implants and grafts: Secondary | ICD-10-CM | POA: Insufficient documentation

## 2014-11-22 DIAGNOSIS — I10 Essential (primary) hypertension: Secondary | ICD-10-CM | POA: Diagnosis not present

## 2014-11-22 NOTE — Patient Instructions (Signed)

## 2014-11-22 NOTE — Progress Notes (Signed)
Filed Vitals:   11/22/14 1133 11/22/14 1136 11/22/14 1144 11/22/14 1145  BP: 143/80 141/83 154/72 142/88  Pulse: 78 79 73 73  Temp:  98.1 F (36.7 C)    TempSrc:  Oral    Resp:  16    Height:  5\' 1"  (1.549 m)    Weight:  168 lb (76.204 kg)    SpO2:  99%

## 2014-11-22 NOTE — Progress Notes (Signed)
[facility] [place] HISTORY AND PHYSICAL   MRN : [id number]  History of Present Illness:   April Branch is a 69 y.o. female patient of Dr. Oneida Alar who is status post left femoropopliteal bypass graft with propaten on 10/12/2011 with subsequent occlusion, and left femoral to below-knee popliteal bypass with propaten on 04/17/2014.  She also has a history of carotid artery stenosis.   She returns today for follow up. She denies non healing wounds. She has severe vertigo/ataxia. States she had brain imaging about 2010 which demonstrated 3 TIA'S, has intermittent dizziness, has tingling and numbness in both hands, weakness in both hands, moreso in left.  Carotid Duplex on file result was from 8/5/20013, showed 60-79% right ICA stenosis and 40-59% left ICA stenosis, requested by Dr. Burt Knack, pt does not recall this.  March, 2015 carotid Duplex demonstrated <40% stenosis in both internal carotid arteries.  She has known lumbar spine issues with radiculopathy in both legs.  She gets choked easily, has to drink through a straw.  She no longer has dry cracked fissures on both heals. She has no claudication symptoms in her left leg. Her right calf hurts after walking about 1/2 mile, relieved with rest.  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  She has intermittent dizziness, has tingling and numbness in both hands, weakness in both hands, moreso in left.  She had severe headaches several years ago which prompted brain imaging, states was found she had 3 ministrokes, she does not recall any TIA symptoms other than the headache, denies lateralizing symptoms.   She was hospitalized recently at [facility] for dehydration, does not know how she got dehydrated.   Pt states her B12 level has not been checked, she reports worsening ataxia over the last year.    Pt Diabetic: Yes,11.9 A1C on 10/01/14 (review of records); this is an improvement since she has been seeing Dr. [name]  Pt smoker: former smoker (quit January 2016, started at age 73 yrs)  Pt meds include:  Statin :Yes  ASA: No  Other anticoagulants/antiplatelets: Plavix      Current Outpatient Prescriptions  Medication Sig Dispense Refill  . ACCU-CHEK AVIVA PLUS test strip     . ACCU-CHEK SOFTCLIX LANCETS lancets     . Alcohol Swabs (B-D SINGLE USE SWABS REGULAR) PADS     . aspirin 81 MG tablet Take 81 mg by mouth daily.    . clopidogrel (PLAVIX) 75 MG tablet Take 75 mg by mouth daily.     . cyclobenzaprine (FLEXERIL) 10 MG tablet Take 10 mg by mouth at bedtime as needed for muscle spasms.     . diphenhydrAMINE (BENADRYL) 25 MG tablet Take 25 mg by mouth every 6 (six) hours as needed for itching or allergies.    . DULoxetine (CYMBALTA) 30 MG capsule Take 30 mg by mouth at bedtime. Take two tablets at night 30 mg    . hydrochlorothiazide (HYDRODIURIL) 25 MG tablet Take 25 mg by mouth every 3 (three) days. Takes 1/2 tablet daily    . HYDROcodone-acetaminophen (NORCO) 10-325 MG per tablet Take 1 tablet by mouth every 4 (four) hours as needed (pain). 30 tablet 0  . insulin aspart (NOVOLOG) 100 UNIT/ML injection Inject 20 Units into the skin 3 (three) times daily with meals. 20 mL 3  . Insulin Glargine (TOUJEO SOLOSTAR) 300 UNIT/ML  SOPN Inject 60 Units into the skin 2 (two) times daily. 24 mL 3  . Insulin Pen Needle (BD PEN NEEDLE NANO U/F) 32G X 4 MM MISC Use to inject insulin 100 each 3  . IRON PO Take 1 tablet by mouth daily.    . lansoprazole (PREVACID) 30 MG capsule Take 1 capsule (30 mg total) by mouth 2 (two) times daily before a meal. 60 capsule 11  . levothyroxine (SYNTHROID, LEVOTHROID) 137 MCG tablet Take 1 tablet (137 mcg total) by mouth daily before breakfast. 30 tablet 3  . LORazepam (ATIVAN) 1 MG tablet Take 3 mg by mouth at bedtime.    . metFORMIN (GLUCOPHAGE XR) 750 MG  24 hr tablet Take 1 tablet (750 mg total) by mouth daily with breakfast. Take 1 tablet daily for the first week, then increase to 2 tablets daily 60 tablet 3  . metoprolol succinate (TOPROL-XL) 25 MG 24 hr tablet     . nortriptyline (PAMELOR) 75 MG capsule Take 75 mg by mouth at bedtime.    . pravastatin (PRAVACHOL) 20 MG tablet Take 20 mg by mouth daily.    Marland Kitchen tolterodine (DETROL LA) 4 MG 24 hr capsule Take 4 mg by mouth daily.     . [DISCONTINUED] Gabapentin (NEURONTIN PO) Take by mouth. New medicine, pt doesn't remember dosage     No current facility-administered medications for this visit.    Past Medical History  Diagnosis Date  . Hyperlipidemia   . Arthritis   . Hypothyroidism   . GERD (gastroesophageal reflux disease)   . Anxiety   . Diabetes mellitus age 46    insulin dependent  . Peripheral vascular disease   . Lumbar disc disease   . Insulin dependent type 2 diabetes mellitus, controlled   . GERD 07/20/2006  . CAD (coronary artery disease)   . CAD (coronary artery disease) of bypass graft   . Vertigo   . History of transient ischemic attack (TIA)   . Myocardial infarction     AGE 50    . Hypertension   . Depression   . COPD 12/14/2008    PATIENT DENIES    . Personal history of malignant neoplasm of kidney(V10.52) 12/14/2008    Laparoscopic biopsy and cryoablation 7/08 Dr. Vernie Shanks   . Headache(784.0)   . Neuromuscular disorder     PERIPHERAL NEUROPATHY  . Anemia   . Diabetic coma Feb. 2014  . Fall at home Jan. 2, 2016  Jan. 13, 2016    Left knee  . Anginal pain     1 MONTH AGO   . Stroke     3 MINI STROKES  RT SIDED WEAKNESS  . Fibromyalgia   . HPV (human papilloma virus) infection   . Colon polyps 09/11/2010    Tubular adenoma  . Cancer     CANCER OF KIDNEY  DR Danise Mina (LEFT), "They froze it."    Social History Social History  Substance Use Topics  . Smoking status: Former Smoker -- 0.25 packs/day for 50 years    Types: Cigarettes    Quit date:  03/17/2014  . Smokeless tobacco: Never Used  . Alcohol Use: No    Family History Family History  Problem Relation Age of Onset  . Heart disease Mother   . Lung cancer Mother   . Cancer Mother     Lung  . Heart disease Father   . Lung cancer Father   . Cancer Father     Lung  . Heart  disease Sister     CABG- Open Heart    Surgical History Past Surgical History  Procedure Laterality Date  . Appendectomy    . Abdominal hysterectomy    . Knee surgery Bilateral     "cartilage"  . Cholecystectomy    . Lung surgery      lung nodule removed from the right side  . Laparascopic cryoablation of left kidney  08/2006    Dr. Gladis Riffle for renal cell cancer  . Bladder suspension    . Shoulder surgery Bilateral     'Spurs"  . Foot surgery    . Pr vein bypass graft,aorto-fem-pop  05/27/2010  . Cardiac catheterization    . Femoral-popliteal bypass graft  10/12/2011    Procedure: BYPASS GRAFT FEMORAL-POPLITEAL ARTERY;  Surgeon: Elam Dutch, MD;  Location: [facility] OR;  Service: Vascular;  Laterality: Left;  Left Femoral-Popliteal Bypass Graft using 57mm x 80cm Propaten Graft with intraop arteriogram times one.  . Intraoperative arteriogram  10/12/2011    Procedure: INTRA OPERATIVE ARTERIOGRAM;  Surgeon: Elam Dutch, MD;  Location: [place];  Service: Vascular;  Laterality: Left;  . Left heart catheterization with coronary angiogram N/A 05/11/2011    Procedure: LEFT HEART CATHETERIZATION WITH CORONARY ANGIOGRAM;  Surgeon: Jolaine Artist, MD;  Location: [facility] CATH LAB;  Service: Cardiovascular;  Laterality: N/A;  . Abdominal aortagram N/A 08/21/2011    Procedure: ABDOMINAL Maxcine Ham;  Surgeon: Elam Dutch, MD;  Location: [facility] CATH LAB;  Service: Cardiovascular;  Laterality: N/A;  . Abdominal aortagram N/A 03/16/2014    Procedure: ABDOMINAL Maxcine Ham;  Surgeon: Elam Dutch, MD;  Location: [facility] CATH LAB;  Service: Cardiovascular;  Laterality: N/A;  . Femoral-popliteal bypass graft Left  04/17/2014    Procedure: REDO LEFT FEMORAL-POPLITEAL ARTERY BYPASS USING GORE PROPATEN 49mmx80cm GRAFT;  Surgeon: Elam Dutch, MD;  Location: [place];  Service: Vascular;  Laterality: Left;  . Esophagogastroduodenoscopy (egd) with propofol N/A 05/16/2014    Procedure: ESOPHAGOGASTRODUODENOSCOPY (EGD) WITH PROPOFOL with Balloon dilation;  Surgeon: Milus Banister, MD;  Location: [place];  Service: Endoscopy;  Laterality: N/A;    No Known Allergies  Current Outpatient Prescriptions  Medication Sig Dispense Refill  . ACCU-CHEK AVIVA PLUS test strip     . ACCU-CHEK SOFTCLIX LANCETS lancets     . Alcohol Swabs (B-D SINGLE USE SWABS REGULAR) PADS     . aspirin 81 MG tablet Take 81 mg by mouth daily.    . clopidogrel (PLAVIX) 75 MG tablet Take 75 mg by mouth daily.     . cyclobenzaprine (FLEXERIL) 10 MG tablet Take 10 mg by mouth at bedtime as needed for muscle spasms.     . diphenhydrAMINE (BENADRYL) 25 MG tablet Take 25 mg by mouth every 6 (six) hours as needed for itching or allergies.    . DULoxetine (CYMBALTA) 30 MG capsule Take 30 mg by mouth at bedtime. Take two tablets at night 30 mg    . hydrochlorothiazide (HYDRODIURIL) 25 MG tablet Take 25 mg by mouth every 3 (three) days. Takes 1/2 tablet daily    . HYDROcodone-acetaminophen (NORCO) 10-325 MG per tablet Take 1 tablet by mouth every 4 (four) hours as needed (pain). 30 tablet 0  . insulin aspart (NOVOLOG) 100 UNIT/ML injection Inject 20 Units into the skin 3 (three) times daily with meals. 20 mL 3  . Insulin Glargine (TOUJEO SOLOSTAR) 300 UNIT/ML SOPN Inject 60 Units into the skin 2 (two) times daily. 24 mL 3  . Insulin  Pen Needle (BD PEN NEEDLE NANO U/F) 32G X 4 MM MISC Use to inject insulin 100 each 3  . IRON PO Take 1 tablet by mouth daily.    . lansoprazole (PREVACID) 30 MG capsule Take 1 capsule (30 mg total) by mouth 2 (two) times daily before a meal. 60 capsule 11  . levothyroxine (SYNTHROID, LEVOTHROID) 137 MCG tablet Take 1  tablet (137 mcg total) by mouth daily before breakfast. 30 tablet 3  . LORazepam (ATIVAN) 1 MG tablet Take 3 mg by mouth at bedtime.    . metFORMIN (GLUCOPHAGE XR) 750 MG 24 hr tablet Take 1 tablet (750 mg total) by mouth daily with breakfast. Take 1 tablet daily for the first week, then increase to 2 tablets daily 60 tablet 3  . metoprolol succinate (TOPROL-XL) 25 MG 24 hr tablet     . nortriptyline (PAMELOR) 75 MG capsule Take 75 mg by mouth at bedtime.    . pravastatin (PRAVACHOL) 20 MG tablet Take 20 mg by mouth daily.    Marland Kitchen tolterodine (DETROL LA) 4 MG 24 hr capsule Take 4 mg by mouth daily.     . [DISCONTINUED] Gabapentin (NEURONTIN PO) Take by mouth. New medicine, pt doesn't remember dosage     No current facility-administered medications for this visit.     REVIEW OF SYSTEMS: See HPI for pertinent positives and negatives.  Physical Examination Filed Vitals:   11/22/14 1133 11/22/14 1136  BP: 143/80 141/83  Pulse: 78 79  Temp:  98.1 F (36.7 C)  TempSrc:  Oral  Resp:  16  Height:  5\' 1"  (1.549 m)  Weight:  168 lb (76.204 kg)  SpO2:  99%   Body mass index is 31.76 kg/(m^2).  General: A&O x 3, WDWN, obese female.  Gait: mildly ataxic Eyes: PERRLA.  Pulmonary: CTAB, without wheezes , or rhonchi. No dyspnea. Cardiac: regular Rhythm, no detected murmur.   Carotid Bruits  Left  Right    Negative  positive   Aorta is not palpable.  Radial pulses: right is 1+ palpable, left is not present, left brachial pulse is 2+ palpable   VASCULAR EXAM:  Extremities without ischemic changes  without Gangrene; without open wounds.   LE Pulses  LEFT  RIGHT   FEMORAL  2+ palpable  1+ palpable   POPLITEAL  not palpable  not palpable   POSTERIOR TIBIAL  faintly palpable  not palpable   DORSALIS PEDIS  ANTERIOR TIBIAL  2+palpable  not palpable    Abdomen: soft, NT, no palpable masses.  Skin: no rashes, no  ulcers, see Extremities.  Musculoskeletal: no muscle wasting or atrophy.  Neurologic: A&O X 3; Appropriate Affect; MOTOR FUNCTION: moving all extremities equally, motor strength 5/5 throughout. Speech is slightly slurred. CN 2-12 intact except for uvula deviation to the right.                 Non-Invasive Vascular Imaging (11/22/2014):   Carotid Duplex: Less than 40% bilateral internal carotid artery stenosis. Bilateral vertebral arteries are antegrade. Essentially unchanged from 05/19/13.   Lower Extremity arterial Duplex: Patent left femoral popliteal artery bypass graft. Elevated velocities at the distal graft anastomosis suggestive of 50-70% stenosis; this may be over estimated due to diameter mismatch of graft to native vessel. Decreased ABI of the right leg and stable on the left leg compared to 08/16/14.  ABI (Date: 11/22/2014)  R: 0.66 (0.82, 08/16/14), DP: monophasic, PT: monophasic, TBI: 0.56  L: 1.01 (1.11), DP: triphasic, PT: triphasic, TBI:  0.95     ASSESSMENT:  April Branch is a 69 y.o. female who is status post left femoropopliteal bypass graft with propaten on 10/12/2011 with subsequent occlusion, left femoral to below-knee popliteal bypass with propaten on 04/17/2014.  She also has a history of carotid artery stenosis.   She does not give a history of known TIA events, but pt reports that brain imaging from about 2010 indicates a history of 3 TIA's. She has intermittent vertigo or ataxia, not clear if this is TIA related.  Today's carotid duplex suggests minimal bilateral ICA stenoses, no significant change from 05/19/13.  Today's left LE arterial duplex suggests a patent left femoral popliteal artery bypass graft. Elevated velocities at the distal graft anastomosis suggestive of 50-70% stenosis; this may be over estimated due to diameter mismatch of graft to native vessel. Right ABI is worse today compared to 3 months ago, from mild to moderate arterial  occlusive disease with monophasic waveforms; left ABI remains normal with triphasic waveforms.  She has mild claudication in her right calf with walking, no claudication in her left leg.  She stopped smoking in January this year. Her DM remains uncontrolled but is improving with Dr. Ronnie Derby help.  Face to face time with patient was 25 minutes. Over 50% of this time was spent on counseling and coordination of care.  PLAN:   Graduated walking program, discussed implementation of such.  Based on today's exam and non-invasive vascular lab results, the patient will follow up in 3 months with the following tests: ABI's and left LE arterial duplex. Carotid duplex in a year. I discussed in depth with the patient the nature of atherosclerosis, and emphasized the importance of maximal medical management including strict control of blood pressure, blood glucose, and lipid levels, obtaining regular exercise, and cessation of smoking.  The patient is aware that without maximal medical management the underlying atherosclerotic disease process will progress, limiting the benefit of any interventions.  The patient was given information about stroke prevention and what symptoms should prompt the patient to seek immediate medical care.  The patient was given information about PAD including signs, symptoms, treatment, what symptoms should prompt the patient to seek immediate medical care, and risk reduction measures to take. Thank you for allowing [place] to participate in this patient's care.  Clemon Chambers, RN, MSN, FNP-C [facility] Office: [phone]  Clinic MD: [facility]  11/22/2014 11:41 AM

## 2014-11-26 ENCOUNTER — Other Ambulatory Visit (INDEPENDENT_AMBULATORY_CARE_PROVIDER_SITE_OTHER): Payer: Medicare PPO

## 2014-11-26 DIAGNOSIS — IMO0002 Reserved for concepts with insufficient information to code with codable children: Secondary | ICD-10-CM

## 2014-11-26 DIAGNOSIS — E1165 Type 2 diabetes mellitus with hyperglycemia: Secondary | ICD-10-CM

## 2014-11-26 LAB — BASIC METABOLIC PANEL
BUN: 14 mg/dL (ref 6–23)
CHLORIDE: 101 meq/L (ref 96–112)
CO2: 25 meq/L (ref 19–32)
CREATININE: 1.05 mg/dL (ref 0.40–1.20)
Calcium: 9.1 mg/dL (ref 8.4–10.5)
GFR: 55.51 mL/min — ABNORMAL LOW (ref >60.00)
Glucose, Bld: 234 mg/dL — ABNORMAL HIGH (ref 70–99)
Potassium: 3.7 mEq/L (ref 3.5–5.1)
Sodium: 136 mEq/L (ref 135–145)

## 2014-11-27 ENCOUNTER — Emergency Department (HOSPITAL_COMMUNITY)
Admission: EM | Admit: 2014-11-27 | Discharge: 2014-11-28 | Disposition: A | Discharge status: Home / Self Care | Payer: Medicare PPO | Attending: Emergency Medicine | Admitting: Emergency Medicine

## 2014-11-27 ENCOUNTER — Encounter (HOSPITAL_COMMUNITY): Payer: Self-pay | Admitting: *Deleted

## 2014-11-27 DIAGNOSIS — Z86018 Personal history of other benign neoplasm: Secondary | ICD-10-CM | POA: Insufficient documentation

## 2014-11-27 DIAGNOSIS — M1990 Unspecified osteoarthritis, unspecified site: Secondary | ICD-10-CM | POA: Insufficient documentation

## 2014-11-27 DIAGNOSIS — Z8673 Personal history of transient ischemic attack (TIA), and cerebral infarction without residual deficits: Secondary | ICD-10-CM | POA: Diagnosis not present

## 2014-11-27 DIAGNOSIS — Z7982 Long term (current) use of aspirin: Secondary | ICD-10-CM | POA: Diagnosis not present

## 2014-11-27 DIAGNOSIS — F329 Major depressive disorder, single episode, unspecified: Secondary | ICD-10-CM | POA: Insufficient documentation

## 2014-11-27 DIAGNOSIS — Z85528 Personal history of other malignant neoplasm of kidney: Secondary | ICD-10-CM | POA: Diagnosis not present

## 2014-11-27 DIAGNOSIS — Z9889 Other specified postprocedural states: Secondary | ICD-10-CM | POA: Insufficient documentation

## 2014-11-27 DIAGNOSIS — E039 Hypothyroidism, unspecified: Secondary | ICD-10-CM | POA: Diagnosis not present

## 2014-11-27 DIAGNOSIS — R197 Diarrhea, unspecified: Secondary | ICD-10-CM

## 2014-11-27 DIAGNOSIS — E162 Hypoglycemia, unspecified: Secondary | ICD-10-CM

## 2014-11-27 DIAGNOSIS — D649 Anemia, unspecified: Secondary | ICD-10-CM | POA: Insufficient documentation

## 2014-11-27 DIAGNOSIS — F419 Anxiety disorder, unspecified: Secondary | ICD-10-CM | POA: Insufficient documentation

## 2014-11-27 DIAGNOSIS — J449 Chronic obstructive pulmonary disease, unspecified: Secondary | ICD-10-CM | POA: Diagnosis not present

## 2014-11-27 DIAGNOSIS — I10 Essential (primary) hypertension: Secondary | ICD-10-CM | POA: Insufficient documentation

## 2014-11-27 DIAGNOSIS — E785 Hyperlipidemia, unspecified: Secondary | ICD-10-CM | POA: Insufficient documentation

## 2014-11-27 DIAGNOSIS — Z794 Long term (current) use of insulin: Secondary | ICD-10-CM | POA: Diagnosis not present

## 2014-11-27 DIAGNOSIS — Z79899 Other long term (current) drug therapy: Secondary | ICD-10-CM | POA: Insufficient documentation

## 2014-11-27 DIAGNOSIS — G629 Polyneuropathy, unspecified: Secondary | ICD-10-CM | POA: Diagnosis not present

## 2014-11-27 DIAGNOSIS — K219 Gastro-esophageal reflux disease without esophagitis: Secondary | ICD-10-CM | POA: Insufficient documentation

## 2014-11-27 DIAGNOSIS — Z87891 Personal history of nicotine dependence: Secondary | ICD-10-CM | POA: Diagnosis not present

## 2014-11-27 DIAGNOSIS — Z7984 Long term (current) use of oral hypoglycemic drugs: Secondary | ICD-10-CM | POA: Insufficient documentation

## 2014-11-27 DIAGNOSIS — M797 Fibromyalgia: Secondary | ICD-10-CM | POA: Insufficient documentation

## 2014-11-27 DIAGNOSIS — Z7902 Long term (current) use of antithrombotics/antiplatelets: Secondary | ICD-10-CM | POA: Insufficient documentation

## 2014-11-27 DIAGNOSIS — I25119 Atherosclerotic heart disease of native coronary artery with unspecified angina pectoris: Secondary | ICD-10-CM | POA: Insufficient documentation

## 2014-11-27 DIAGNOSIS — R112 Nausea with vomiting, unspecified: Secondary | ICD-10-CM

## 2014-11-27 DIAGNOSIS — E11649 Type 2 diabetes mellitus with hypoglycemia without coma: Secondary | ICD-10-CM | POA: Diagnosis not present

## 2014-11-27 DIAGNOSIS — I252 Old myocardial infarction: Secondary | ICD-10-CM | POA: Diagnosis not present

## 2014-11-27 DIAGNOSIS — Z8619 Personal history of other infectious and parasitic diseases: Secondary | ICD-10-CM | POA: Diagnosis not present

## 2014-11-27 LAB — COMPREHENSIVE METABOLIC PANEL
ALBUMIN: 3.9 g/dL (ref 3.5–5.0)
ALT: 12 U/L — ABNORMAL LOW (ref 14–54)
ANION GAP: 12 (ref 5–15)
AST: 24 U/L (ref 15–41)
Alkaline Phosphatase: 117 U/L (ref 38–126)
BUN: 15 mg/dL (ref 6–20)
CO2: 25 mmol/L (ref 22–32)
Calcium: 9.2 mg/dL (ref 8.9–10.3)
Chloride: 100 mmol/L — ABNORMAL LOW (ref 101–111)
Creatinine, Ser: 1.25 mg/dL — ABNORMAL HIGH (ref 0.44–1.00)
GFR calc Af Amer: 50 mL/min — ABNORMAL LOW (ref >60)
GFR calc non Af Amer: 43 mL/min — ABNORMAL LOW (ref >60)
GLUCOSE: 62 mg/dL — AB (ref 65–99)
POTASSIUM: 3.1 mmol/L — AB (ref 3.5–5.1)
SODIUM: 137 mmol/L (ref 135–145)
TOTAL PROTEIN: 8 g/dL (ref 6.5–8.1)
Total Bilirubin: 0.4 mg/dL (ref 0.3–1.2)

## 2014-11-27 LAB — CBC WITH DIFFERENTIAL/PLATELET
Basophils Absolute: 0 10*3/uL (ref 0.0–0.1)
Basophils Relative: 0 %
Eosinophils Absolute: 0.1 10*3/uL (ref 0.0–0.7)
Eosinophils Relative: 1 %
HEMATOCRIT: 38.4 % (ref 36.0–46.0)
Hemoglobin: 12.7 g/dL (ref 12.0–15.0)
Lymphocytes Relative: 12 %
Lymphs Abs: 1.7 10*3/uL (ref 0.7–4.0)
MCH: 27.7 pg (ref 26.0–34.0)
MCHC: 33.1 g/dL (ref 30.0–36.0)
MCV: 83.7 fL (ref 78.0–100.0)
MONO ABS: 0.7 10*3/uL (ref 0.1–1.0)
MONOS PCT: 5 %
Neutro Abs: 11.4 10*3/uL — ABNORMAL HIGH (ref 1.7–7.7)
Neutrophils Relative %: 82 %
Platelets: 332 10*3/uL (ref 150–400)
RBC: 4.59 MIL/uL (ref 3.87–5.11)
RDW: 14.2 % (ref 11.5–15.5)
WBC: 13.9 10*3/uL — ABNORMAL HIGH (ref 4.0–10.5)

## 2014-11-27 LAB — CBG MONITORING, ED
GLUCOSE-CAPILLARY: 64 mg/dL — AB (ref 65–99)
Glucose-Capillary: 166 mg/dL — ABNORMAL HIGH (ref 65–99)

## 2014-11-27 LAB — FRUCTOSAMINE: FRUCTOSAMINE: 257 umol/L (ref 0–285)

## 2014-11-27 MED ORDER — SODIUM CHLORIDE 0.9 % IV SOLN
1000.0000 mL | INTRAVENOUS | Status: DC
  Administered 2014-11-27: 1000 mL via INTRAVENOUS

## 2014-11-27 MED ORDER — ONDANSETRON HCL 4 MG/2ML IJ SOLN
4.0000 mg | Freq: Once | INTRAMUSCULAR | Status: AC
  Administered 2014-11-27: 4 mg via INTRAVENOUS
  Filled 2014-11-27: qty 2

## 2014-11-27 MED ORDER — SODIUM CHLORIDE 0.9 % IV SOLN
1000.0000 mL | Freq: Once | INTRAVENOUS | Status: AC
  Administered 2014-11-27: 1000 mL via INTRAVENOUS

## 2014-11-27 MED ORDER — POTASSIUM CHLORIDE CRYS ER 20 MEQ PO TBCR
40.0000 meq | EXTENDED_RELEASE_TABLET | Freq: Once | ORAL | Status: AC
  Administered 2014-11-27: 40 meq via ORAL
  Filled 2014-11-27: qty 2

## 2014-11-27 MED ORDER — DEXTROSE 50 % IV SOLN
1.0000 | Freq: Once | INTRAVENOUS | Status: AC
  Administered 2014-11-27: 50 mL via INTRAVENOUS
  Filled 2014-11-27: qty 50

## 2014-11-27 MED ORDER — ONDANSETRON HCL 8 MG PO TABS: 8.0000 mg | ORAL_TABLET | Freq: Three times a day (TID) | ORAL | Status: DC | PRN

## 2014-11-27 NOTE — Discharge Instructions (Signed)

## 2014-11-27 NOTE — ED Provider Notes (Signed)
CSN: [id number]     Arrival date & time 11/27/14  1834 History   First MD Initiated Contact with Patient 11/27/14 1902     Chief Complaint  Patient presents with  . Hypoglycemia     (Consider location/radiation/quality/duration/timing/severity/associated sxs/prior Treatment) HPI   April Branch is a 69 y.o. female who presents for evaluation of nausea, vomiting, diarrhea, which began today. She is unable to eat or drink anything. She did not use her insulin today. She denies sick contacts or abnormal food ingestion. No fever, cough, shortness of breath or chest pain. There are no other known modifying factors.   Past Medical History  Diagnosis Date  . Hyperlipidemia   . Arthritis   . Hypothyroidism   . GERD (gastroesophageal reflux disease)   . Anxiety   . Diabetes mellitus age 36    insulin dependent  . Peripheral vascular disease ([place])   . Lumbar disc disease   . Insulin dependent type 2 diabetes mellitus, controlled ([place])   . GERD 07/20/2006  . CAD (coronary artery disease)   . CAD (coronary artery disease) of bypass graft   . Vertigo   . History of transient ischemic attack (TIA)   . Myocardial infarction ([place])     AGE 89    . Hypertension   . Depression   . COPD 12/14/2008    PATIENT DENIES    . Personal history of malignant neoplasm of kidney(V10.52) 12/14/2008    Laparoscopic biopsy and cryoablation 7/08 Dr. Vernie Shanks   . Headache(784.0)   . Neuromuscular disorder ([place])     PERIPHERAL NEUROPATHY  . Anemia   . Diabetic coma ([place]) Feb. 2014  . Fall at home Jan. 2, 2016  Jan. 13, 2016    Left knee  . Anginal pain ([place])     1 MONTH AGO   . Stroke [facility])     3 MINI STROKES  RT SIDED WEAKNESS  . Fibromyalgia   . HPV (human papilloma virus) infection   . Colon polyps 09/11/2010    Tubular adenoma  . 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 Appendectomy    . Abdominal  hysterectomy    . Knee surgery Bilateral     "cartilage"  . Cholecystectomy    . Lung surgery      lung nodule removed from the right side  . Laparascopic cryoablation of left kidney  08/2006    Dr. Gladis Riffle for renal cell cancer  . Bladder suspension    . Shoulder surgery Bilateral     'Spurs"  . Foot surgery    . Pr vein bypass graft,aorto-fem-pop  05/27/2010  . Cardiac catheterization    . Femoral-popliteal bypass graft  10/12/2011    Procedure: BYPASS GRAFT FEMORAL-POPLITEAL ARTERY;  Surgeon: Elam Dutch, MD;  Location: [facility] OR;  Service: Vascular;  Laterality: Left;  Left Femoral-Popliteal Bypass Graft using 32mm x 80cm Propaten Graft with intraop arteriogram times one.  . Intraoperative arteriogram  10/12/2011    Procedure: INTRA OPERATIVE ARTERIOGRAM;  Surgeon: Elam Dutch, MD;  Location: [place];  Service: Vascular;  Laterality: Left;  . Left heart catheterization with coronary angiogram N/A 05/11/2011    Procedure: LEFT HEART CATHETERIZATION WITH CORONARY ANGIOGRAM;  Surgeon: Jolaine Artist, MD;  Location: [facility] CATH LAB;  Service: Cardiovascular;  Laterality: N/A;  . Abdominal aortagram N/A 08/21/2011    Procedure: ABDOMINAL  Maxcine Ham;  Surgeon: Elam Dutch, MD;  Location: [facility] CATH LAB;  Service: Cardiovascular;  Laterality: N/A;  . Abdominal aortagram N/A 03/16/2014    Procedure: ABDOMINAL Maxcine Ham;  Surgeon: Elam Dutch, MD;  Location: [facility] CATH LAB;  Service: Cardiovascular;  Laterality: N/A;  . Femoral-popliteal bypass graft Left 04/17/2014    Procedure: REDO LEFT FEMORAL-POPLITEAL ARTERY BYPASS USING GORE PROPATEN 74mmx80cm GRAFT;  Surgeon: Elam Dutch, MD;  Location: [place];  Service: Vascular;  Laterality: Left;  . Esophagogastroduodenoscopy (egd) with propofol N/A 05/16/2014    Procedure: ESOPHAGOGASTRODUODENOSCOPY (EGD) WITH PROPOFOL with Balloon dilation;  Surgeon: Milus Banister, MD;  Location: [place];  Service: Endoscopy;  Laterality: N/A;   Family  History  Problem Relation Age of Onset  . Heart disease Mother   . Lung cancer Mother   . Cancer Mother     Lung  . Heart disease Father   . Lung cancer Father   . Cancer Father     Lung  . Heart disease Sister     CABG- Open Heart   Social History  Substance Use Topics  . Smoking status: Former Smoker -- 0.25 packs/day for 50 years    Types: Cigarettes    Quit date: 03/17/2014  . Smokeless tobacco: Never Used  . Alcohol Use: No   OB History    No data available     Review of Systems  All other systems reviewed and are negative.     Allergies  Review of patient's allergies indicates no known allergies.  Home Medications   Prior to Admission medications   Medication Sig Start Date End Date Taking? Authorizing Provider  aspirin 81 MG tablet Take 81 mg by mouth daily.   Yes Historical Provider, MD  clopidogrel (PLAVIX) 75 MG tablet Take 75 mg by mouth daily.  04/02/14  Yes Historical Provider, MD  DULoxetine (CYMBALTA) 30 MG capsule Take 60 mg by mouth at bedtime. Take two tablets at night 30 mg   Yes Historical Provider, MD  ferrous sulfate 325 (65 FE) MG tablet Take 325 mg by mouth 2 (two) times daily with a meal.   Yes Historical Provider, MD  hydrochlorothiazide (HYDRODIURIL) 25 MG tablet Take 25 mg by mouth See admin instructions. Take 1 tablet by mouth every 3 days per patient 07/10/13  Yes Historical Provider, MD  HYDROcodone-acetaminophen (NORCO) 10-325 MG per tablet Take 1 tablet by mouth every 4 (four) hours as needed (pain). 04/20/14  Yes Kimberly A Trinh, PA-C  insulin aspart (NOVOLOG) 100 UNIT/ML injection Inject 20 Units into the skin 3 (three) times daily with meals. 10/30/14  Yes Elayne Snare, MD  Insulin Glargine (TOUJEO SOLOSTAR) 300 UNIT/ML SOPN Inject 60 Units into the skin 2 (two) times daily. Patient taking differently: Inject 60 Units into the skin daily.  10/01/14  Yes Elayne Snare, MD  lansoprazole (PREVACID) 30 MG capsule Take 1 capsule (30 mg total) by mouth  2 (two) times daily before a meal. 05/16/14  Yes Milus Banister, MD  levothyroxine (SYNTHROID, LEVOTHROID) 137 MCG tablet Take 1 tablet (137 mcg total) by mouth daily before breakfast. 10/30/14  Yes Elayne Snare, MD  LORazepam (ATIVAN) 1 MG tablet Take 3 mg by mouth at bedtime.   Yes Historical Provider, MD  metFORMIN (GLUCOPHAGE XR) 750 MG 24 hr tablet Take 1 tablet (750 mg total) by mouth daily with breakfast. Take 1 tablet daily for the first week, then increase to 2 tablets daily 10/01/14  Yes Elayne Snare,  MD  nortriptyline (PAMELOR) 75 MG capsule Take 75 mg by mouth at bedtime. 01/24/14  Yes Historical Provider, MD  pravastatin (PRAVACHOL) 20 MG tablet Take 20 mg by mouth daily.   Yes Historical Provider, MD  tolterodine (DETROL LA) 4 MG 24 hr capsule Take 4 mg by mouth daily.    Yes Historical Provider, MD  ACCU-CHEK AVIVA PLUS test strip  01/24/14   Historical Provider, MD  ACCU-CHEK SOFTCLIX LANCETS lancets  06/22/14   Historical Provider, MD  Alcohol Swabs (B-D SINGLE USE SWABS REGULAR) PADS  03/21/13   Historical Provider, MD  Insulin Pen Needle (BD PEN NEEDLE NANO U/F) 32G X 4 MM MISC Use to inject insulin 10/30/14   Elayne Snare, MD  ondansetron (ZOFRAN) 8 MG tablet Take 1 tablet (8 mg total) by mouth every 8 (eight) hours as needed for nausea or vomiting. 11/27/14   Daleen Bo, MD   BP 156/60 mmHg  Pulse 95  Temp(Src) 98.2 F (36.8 C) (Oral)  Resp 23  Ht 5\' 1"  (1.549 m)  Wt 169 lb (76.658 kg)  BMI 31.95 kg/m2  SpO2 95% Physical Exam  Constitutional: She is oriented to person, place, and time. She appears well-developed and well-nourished.  HENT:  Head: Normocephalic and atraumatic.  Right Ear: External ear normal.  Left Ear: External ear normal.  Eyes: Conjunctivae and EOM are normal. Pupils are equal, round, and reactive to light.  Neck: Normal range of motion and phonation normal. Neck supple.  Cardiovascular: Normal rate, regular rhythm and normal heart sounds.   Pulmonary/Chest:  Effort normal and breath sounds normal. She exhibits no bony tenderness.  Abdominal: Soft. There is tenderness (epigastric, mild).  Actively vomiting, gastric juices in the emergency department.  Musculoskeletal: Normal range of motion.  Neurological: She is alert and oriented to person, place, and time. No cranial nerve deficit or sensory deficit. She exhibits normal muscle tone. Coordination normal.  Skin: Skin is warm, dry and intact.  Psychiatric: She has a normal mood and affect. Her behavior is normal. Judgment and thought content normal.  Nursing note and vitals reviewed.   ED Course  Procedures (including critical care time) Labs Review Labs Reviewed  COMPREHENSIVE METABOLIC PANEL - Abnormal; Notable for the following:    Potassium 3.1 (*)    Chloride 100 (*)    Glucose, Bld 62 (*)    Creatinine, Ser 1.25 (*)    ALT 12 (*)    GFR calc non Af Amer 43 (*)    GFR calc Af Amer 50 (*)    All other components within normal limits  CBC WITH DIFFERENTIAL/PLATELET - Abnormal; Notable for the following:    WBC 13.9 (*)    Neutro Abs 11.4 (*)    All other components within normal limits  CBG MONITORING, ED - Abnormal; Notable for the following:    Glucose-Capillary 64 (*)    All other components within normal limits  CBG MONITORING, ED - Abnormal; Notable for the following:    Glucose-Capillary 166 (*)    All other components within normal limits  CBG MONITORING, ED    Imaging Review No results found. I have personally reviewed and evaluated these images and lab results as part of my medical decision-making.   EKG Interpretation None      MDM   Final diagnoses:  Nausea vomiting and diarrhea  Hypoglycemia    Nonspecific, nausea, vomiting, diarrhea. Mild hypoglycemia, after treatment. Doubt serious bacterial infection. Metabolic instability or impending vascular collapse.  Nursing  Notes Reviewed/ Care Coordinated Applicable Imaging Reviewed Interpretation of  Laboratory Data incorporated into ED treatment  The patient appears reasonably screened and/or stabilized for discharge and I doubt any other medical condition or other [facility] requiring further screening, evaluation, or treatment in the ED at this time prior to discharge.  Plan: Home Medications- Zofran; Home Treatments- gradually advance diet; return here if the recommended treatment, does not improve the symptoms; Recommended follow up- PCP 3 days for check up   Daleen Bo, MD 11/27/14 2352

## 2014-11-27 NOTE — ED Notes (Signed)
Pt arrives from home via [facility] c/o hypoglycemia upon Fire arrival CBG 66 after pt ate 2 cookies. EMS CBG 72. Pt also c/o NVD that started today.

## 2014-11-27 NOTE — ED Notes (Signed)
Pt tolerated fluids well.  

## 2014-11-27 NOTE — ED Notes (Signed)
CBG 166 

## 2014-11-28 LAB — CBG MONITORING, ED: GLUCOSE-CAPILLARY: 189 mg/dL — AB (ref 65–99)

## 2014-11-28 NOTE — ED Notes (Signed)
CBG 189. 

## 2014-11-29 ENCOUNTER — Other Ambulatory Visit: Payer: Self-pay | Admitting: *Deleted

## 2014-11-29 ENCOUNTER — Ambulatory Visit (INDEPENDENT_AMBULATORY_CARE_PROVIDER_SITE_OTHER): Payer: Medicare PPO | Admitting: Endocrinology

## 2014-11-29 ENCOUNTER — Encounter: Payer: Self-pay | Admitting: Endocrinology

## 2014-11-29 VITALS — BP 160/88 | HR 91 | Temp 98.2°F | Resp 16 | Ht 61.0 in | Wt 171.0 lb

## 2014-11-29 DIAGNOSIS — E876 Hypokalemia: Secondary | ICD-10-CM | POA: Diagnosis not present

## 2014-11-29 DIAGNOSIS — E1165 Type 2 diabetes mellitus with hyperglycemia: Secondary | ICD-10-CM | POA: Diagnosis not present

## 2014-11-29 DIAGNOSIS — E1142 Type 2 diabetes mellitus with diabetic polyneuropathy: Secondary | ICD-10-CM

## 2014-11-29 DIAGNOSIS — E038 Other specified hypothyroidism: Secondary | ICD-10-CM | POA: Diagnosis not present

## 2014-11-29 DIAGNOSIS — Z794 Long term (current) use of insulin: Secondary | ICD-10-CM

## 2014-11-29 MED ORDER — POTASSIUM CHLORIDE ER 10 MEQ PO TBCR: 10.0000 meq | EXTENDED_RELEASE_TABLET | Freq: Every day | ORAL | Status: DC

## 2014-11-29 MED ORDER — ACCU-CHEK FASTCLIX LANCETS MISC: Status: AC

## 2014-11-29 MED ORDER — LISINOPRIL 5 MG PO TABS: 5.0000 mg | ORAL_TABLET | Freq: Every day | ORAL | Status: DC

## 2014-11-29 NOTE — Patient Instructions (Signed)
Check blood sugars on waking up .Marland Kitchen3-4  .Marland Kitchen times a week Also check blood sugars about 2 hours after a meal and do this after different meals by rotation Recommended blood sugar levels on waking up is 90-130 and about 2 hours after meal is 140-180 Please bring blood sugar monitor to each visit.  Reduce Toujeo to 74  Toujeo: if am sugar <100 then reduce to 50  Take at least 10 Novolog before eating any starchy foods, more for larger meals and high fat  Lisinopril 5mg 

## 2014-11-29 NOTE — Progress Notes (Signed)
Patient ID: April Branch, female   DOB: November 17, 1945, 69 y.o.   MRN: [id number]           Reason for Appointment: Follow-up  for Type 2 Diabetes  Referring physician: Pattricia Boss  History of Present Illness:          Date of diagnosis of type 2 diabetes mellitus: 1980s       Previous history:    She thinks she was treated with metformin for some time before she went on insulin in ?  2002   She has mostly been treated with Lantus and rapid acting insulin such as Apidra or Novolog.   She thinks that Apidra worked fairly fast but insurance did not cover this, generally has had poor control   Recent history:   INSULIN regimen is described as:   Toujeo 60 units twice a day, 10-15 Novolog before meals  On her initial consultation she was switched from Lantus to Toujeo twice a day because of blood sugars mostly over 300 throughout the day Although she was supposed to take Toujeo 80 units in the morning since her last visit she is only taking 60 units on her own  She was also given instructions on increasing Novolog Was started on metformin ER 750 mg and was told to go up to 1500 but she is taking only 1 tablet  Current blood sugar patterns and problems identified:  FASTING blood sugars are now fairly close to normal especially recently  However she is still having postprandial hyperglycemia after her breakfast and evening meals  She is only taking Novolog 10-15 units based on her blood sugar level and this morning did not take any even though she had [place] toast  She is having the highest readings around lunchtime and not taking Novolog even when she is eating carbohydrate in the morning  Having periodic hypoglycemia also which is recently better  Most of her readings late at night are relatively high  She is not able to do much exercise although she is trying to be as active as possible  Oral hypoglycemic drugs the patient is taking are:  Metformin ER 750 mg 1 daily     Side  effects from medications have been: none  Compliance with the medical regimen: fair  Hypoglycemia:  periodically around supper time on the bottom  Glucose monitoring:  done  4  times a day         Glucometer: Accucheck      Blood Glucose readings by download over the last 4 weeks:  Mean values apply above for all meters except median for One Touch  PRE-MEAL Fasting Lunch Dinner Bedtime Overall  Glucose range: 65-204 116-393 66-271 149-284   Mean/median: 151  149 207 168+/-71    Self-care: The diet that the patient has been following is: tries to limit  High-fat foods.     Typical meal intake: Breakfast is variable, does not always eat supper.  Usually has sandwich for lunch               Dietician visit, most recent: never               Exercise:  some slow walking , not able to walk much because of leg pain   Weight history:  Highest 199 , has not lost any weight recently  Wt Readings from Last 3 Encounters:  11/29/14 171 lb (77.565 kg)  11/27/14 169 lb (76.658 kg)  11/22/14 168 lb (76.204 kg)  Glycemic control:   Lab Results  Component Value Date   HGBA1C 11.9 10/01/2014   HGBA1C 7.4* 09/22/2012   HGBA1C 7.8* 05/09/2011   Lab Results  Component Value Date   MICROALBUR 1.0 10/25/2014   LDLCALC 76 02/23/2008   CREATININE 1.25* 11/27/2014         Medication List       This list is accurate as of: 11/29/14  4:20 PM.  Always use your most recent med list.               ACCU-CHEK AVIVA PLUS test strip  Generic drug:  glucose blood     ACCU-CHEK FASTCLIX LANCETS Misc  Use 4 per day to check blood sugar dx code E11.9     aspirin 81 MG tablet  Take 81 mg by mouth daily.     B-D SINGLE USE SWABS REGULAR Pads     clopidogrel 75 MG tablet  Commonly known as:  PLAVIX  Take 75 mg by mouth daily.     DULoxetine 30 MG capsule  Commonly known as:  CYMBALTA  Take 60 mg by mouth at bedtime. Take two tablets at night 30 mg     ferrous sulfate 325 (65 FE) MG  tablet  Take 325 mg by mouth 2 (two) times daily with a meal.     hydrochlorothiazide 25 MG tablet  Commonly known as:  HYDRODIURIL  Take 25 mg by mouth See admin instructions. Take 1 tablet by mouth every 3 days per patient     HYDROcodone-acetaminophen 10-325 MG tablet  Commonly known as:  NORCO  Take 1 tablet by mouth every 4 (four) hours as needed (pain).     insulin aspart 100 UNIT/ML injection  Commonly known as:  novoLOG  Inject 20 Units into the skin 3 (three) times daily with meals.     Insulin Glargine 300 UNIT/ML Sopn  Commonly known as:  TOUJEO SOLOSTAR  Inject 60 Units into the skin 2 (two) times daily.     Insulin Pen Needle 32G X 4 MM Misc  Commonly known as:  BD PEN NEEDLE NANO U/F  Use to inject insulin     lansoprazole 30 MG capsule  Commonly known as:  PREVACID  Take 1 capsule (30 mg total) by mouth 2 (two) times daily before a meal.     levothyroxine 137 MCG tablet  Commonly known as:  SYNTHROID, LEVOTHROID  Take 1 tablet (137 mcg total) by mouth daily before breakfast.     lisinopril 5 MG tablet  Commonly known as:  PRINIVIL,ZESTRIL  Take 1 tablet (5 mg total) by mouth daily.     LORazepam 1 MG tablet  Commonly known as:  ATIVAN  Take 3 mg by mouth at bedtime.     metFORMIN 750 MG 24 hr tablet  Commonly known as:  GLUCOPHAGE XR  Take 1 tablet (750 mg total) by mouth daily with breakfast. Take 1 tablet daily for the first week, then increase to 2 tablets daily     nortriptyline 75 MG capsule  Commonly known as:  PAMELOR  Take 75 mg by mouth at bedtime.     ondansetron 8 MG tablet  Commonly known as:  ZOFRAN  Take 1 tablet (8 mg total) by mouth every 8 (eight) hours as needed for nausea or vomiting.     potassium chloride 10 MEQ tablet  Commonly known as:  K-DUR  Take 1 tablet (10 mEq total) by mouth daily.     pravastatin  20 MG tablet  Commonly known as:  PRAVACHOL  Take 20 mg by mouth daily.     tolterodine 4 MG 24 hr capsule  Commonly  known as:  DETROL LA  Take 4 mg by mouth daily.        Allergies: No Known Allergies  Past Medical History  Diagnosis Date  . Hyperlipidemia   . Arthritis   . Hypothyroidism   . GERD (gastroesophageal reflux disease)   . Anxiety   . Diabetes mellitus age 44    insulin dependent  . Peripheral vascular disease ([place])   . Lumbar disc disease   . Insulin dependent type 2 diabetes mellitus, controlled ([place])   . GERD 07/20/2006  . CAD (coronary artery disease)   . CAD (coronary artery disease) of bypass graft   . Vertigo   . History of transient ischemic attack (TIA)   . Myocardial infarction ([place])     AGE 66    . Hypertension   . Depression   . COPD 12/14/2008    PATIENT DENIES    . Personal history of malignant neoplasm of kidney(V10.52) 12/14/2008    Laparoscopic biopsy and cryoablation 7/08 Dr. Vernie Shanks   . Headache(784.0)   . Neuromuscular disorder ([place])     PERIPHERAL NEUROPATHY  . Anemia   . Diabetic coma ([place]) Feb. 2014  . Fall at home Jan. 2, 2016  Jan. 13, 2016    Left knee  . Anginal pain ([place])     1 MONTH AGO   . [facility]     3 MINI STROKES  RT SIDED WEAKNESS  . Fibromyalgia   . HPV (human papilloma virus) infection   . Colon polyps 09/11/2010    Tubular adenoma  . 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 Appendectomy    . Abdominal hysterectomy    . Knee surgery Bilateral     "cartilage"  . Cholecystectomy    . Lung surgery      lung nodule removed from the right side  . Laparascopic cryoablation of left kidney  08/2006    Dr. Gladis Riffle for renal cell cancer  . Bladder suspension    . Shoulder surgery Bilateral     'Spurs"  . Foot surgery    . Pr vein bypass graft,aorto-fem-pop  05/27/2010  . Cardiac catheterization    . Femoral-popliteal bypass graft  10/12/2011    Procedure: BYPASS GRAFT FEMORAL-POPLITEAL ARTERY;  Surgeon: Elam Dutch, MD;  Location:  [facility] OR;  Service: Vascular;  Laterality: Left;  Left Femoral-Popliteal Bypass Graft using 75mm x 80cm Propaten Graft with intraop arteriogram times one.  . Intraoperative arteriogram  10/12/2011    Procedure: INTRA OPERATIVE ARTERIOGRAM;  Surgeon: Elam Dutch, MD;  Location: [facility];  Service: Vascular;  Laterality: Left;  . Left heart catheterization with coronary angiogram N/A 05/11/2011    Procedure: LEFT HEART CATHETERIZATION WITH CORONARY ANGIOGRAM;  Surgeon: Jolaine Artist, MD;  Location: [facility] CATH LAB;  Service: Cardiovascular;  Laterality: N/A;  . Abdominal aortagram N/A 08/21/2011    Procedure: ABDOMINAL Maxcine Ham;  Surgeon: Elam Dutch, MD;  Location: [facility] CATH LAB;  Service: Cardiovascular;  Laterality: N/A;  . Abdominal aortagram N/A 03/16/2014    Procedure: ABDOMINAL Maxcine Ham;  Surgeon: Elam Dutch, MD;  Location: [facility] CATH LAB;  Service: Cardiovascular;  Laterality: N/A;  . Femoral-popliteal bypass graft Left 04/17/2014  Procedure: REDO LEFT FEMORAL-POPLITEAL ARTERY BYPASS USING GORE PROPATEN 11mmx80cm GRAFT;  Surgeon: Elam Dutch, MD;  Location: [place];  Service: Vascular;  Laterality: Left;  . Esophagogastroduodenoscopy (egd) with propofol N/A 05/16/2014    Procedure: ESOPHAGOGASTRODUODENOSCOPY (EGD) WITH PROPOFOL with Balloon dilation;  Surgeon: Milus Banister, MD;  Location: [employer];  Service: Endoscopy;  Laterality: N/A;    Family History  Problem Relation Age of Onset  . Heart disease Mother   . Lung cancer Mother   . Cancer Mother     Lung  . Heart disease Father   . Lung cancer Father   . Cancer Father     Lung  . Heart disease Sister     CABG- Open Heart    Social History:  reports that she quit smoking about 8 months ago. Her smoking use included Cigarettes. She has a 12.5 pack-year smoking history. She has never used smokeless tobacco. She reports that she does not drink alcohol or use illicit drugs.    Review of Systems   She was in the  emergency room with nausea and vomiting and had a potassium of 3.1.  Given only 2 tablets of potassium   Lipid history:  She has good control with taking pravastatin but has relatively high triglycerides    Lab Results  Component Value Date   CHOL 130 10/25/2014   HDL 41.00 10/25/2014   LDLCALC 76 02/23/2008   LDLDIRECT 61.0 10/25/2014   TRIG 222.0* 10/25/2014   CHOLHDL 3 10/25/2014           Eyes: she is overdue for eye exam   Hypertension: she is taking HCTZ 12.5 mg q 3 days.  Previously was on  hydralazine once a day and low-dose lisinopril. these were stopped because of relatively low blood pressure on her initial consultation.     Neurological:    Has no numbness, burning but does have pains , burning and some tingling in feet.   He is doing a little better with increasing Cymbalta to 60 mg She is being treated with nortriptyline by PCP also  Long history of thyroid disease, Has had post ablative hypothyroidism, her dose was increased to 137 recently   Lab Results  Component Value Date   TSH 7.85* 10/25/2014    Last  Foot exam: Monofilament sensation is markedly reduced across most of the toes and distal plantar surfaces  Physical Examination:  BP 160/88 mmHg  Pulse 91  Temp(Src) 98.2 F (36.8 C)  Resp 16  Ht 5\' 1"  (1.549 m)  Wt 171 lb (77.565 kg)  BMI 32.33 kg/m2  SpO2 97%   standing blood pressure 138/62 No pedal edema present    ASSESSMENT:  Diabetes type 2, uncontrolled    Patient has fairly good blood sugar control and is now using much less basal insulin  She has benefited from switching to Toujeo possibly from adding metformin also. However she is still not understanding the need for taking enough Novolog to cover her meals and carbohydrates and still has relatively high readings after breakfast and supper Having less hypoglycemia recently   Discussed that because of her blood sugars being significantly better or weight may have gone up     NEUROPATHY: She is still having symptomatic discomfort and starting to get somewhat better with increasing her Cymbalta and continuing nortriptyline If  better she can try gabapentin also .  INSOMNIA: She will discuss with PCP  HYPERLIPIDEMIA: she does need to be on a high  intensity statin instead of just 20 mg pravastatin  HYPOTHYROID: check TSH on next visit   History of hypertension: Her blood pressure readings are relatively higher although tend to be significantly lower on standing up Since creatinine is near-normal will have her go back to 5 mg lisinopril Avoid increasing her HCTZ to prevent orthostasis   PLAN:   Take Novolog consistently before meals and adjust based on meal size and carbohydrate intake rather than female blood sugar  Increase metformin ER to 2 tablets daily  Reduce Toujeo to 55 units and possibly take 50 of her fasting reading continues to be below 100  Monitor more readings 2 hours after meals  Follow-up in 6 weeks and review blood sugars again  Potassium supplement for 10 days  Restart low-dose lisinopril 5 mg daily  Chair exercises   Patient Instructions  Check blood sugars on waking up .Marland Kitchen3-4  .Marland Kitchen times a week Also check blood sugars about 2 hours after a meal and do this after different meals by rotation Recommended blood sugar levels on waking up is 90-130 and about 2 hours after meal is 140-180 Please bring blood sugar monitor to each visit.  Reduce Toujeo to 50  Toujeo: if am sugar <100 then reduce to 50  Take at least 10 Novolog before eating any starchy foods, more for larger meals and high fat  Lisinopril 5mg     Counseling time on subjects discussed above is over 50% of today's 25 minute visit    Kaydon Creedon 11/29/2014, 4:20 PM   Note: This office note was prepared with Estate agent. Any transcriptional errors that result from this process are unintentional.

## 2014-11-30 ENCOUNTER — Other Ambulatory Visit: Payer: Self-pay | Admitting: *Deleted

## 2014-11-30 MED ORDER — METFORMIN HCL ER 750 MG PO TB24: ORAL_TABLET | ORAL | Status: DC

## 2014-11-30 MED ORDER — LEVOTHYROXINE SODIUM 137 MCG PO TABS: 137.0000 ug | ORAL_TABLET | Freq: Every day | ORAL | Status: DC

## 2014-12-06 ENCOUNTER — Telehealth: Payer: Self-pay | Admitting: Endocrinology

## 2014-12-06 NOTE — Telephone Encounter (Signed)
Patient called stating that she would like a refill on her Rx   Rx; Cymbalta   Pharmacy: [facility]    Thank you

## 2014-12-07 ENCOUNTER — Other Ambulatory Visit: Payer: Self-pay | Admitting: *Deleted

## 2014-12-07 MED ORDER — DULOXETINE HCL 60 MG PO CPEP
60.0000 mg | ORAL_CAPSULE | Freq: Every day | ORAL | Status: DC
Start: 2014-12-07 — End: 2015-04-01

## 2014-12-07 NOTE — Telephone Encounter (Signed)
No, Dr. [name] does not prescribe this medication.

## 2014-12-07 NOTE — Telephone Encounter (Signed)
Is it ok to refill this medication for the patient

## 2014-12-26 ENCOUNTER — Other Ambulatory Visit: Payer: Self-pay | Admitting: Endocrinology

## 2015-01-07 ENCOUNTER — Other Ambulatory Visit (INDEPENDENT_AMBULATORY_CARE_PROVIDER_SITE_OTHER): Payer: Medicare PPO

## 2015-01-07 DIAGNOSIS — E1165 Type 2 diabetes mellitus with hyperglycemia: Secondary | ICD-10-CM

## 2015-01-07 DIAGNOSIS — Z794 Long term (current) use of insulin: Secondary | ICD-10-CM

## 2015-01-07 DIAGNOSIS — E038 Other specified hypothyroidism: Secondary | ICD-10-CM

## 2015-01-07 LAB — BASIC METABOLIC PANEL
BUN: 24 mg/dL — ABNORMAL HIGH (ref 6–23)
CALCIUM: 9.5 mg/dL (ref 8.4–10.5)
CO2: 25 mEq/L (ref 19–32)
Chloride: 105 mEq/L (ref 96–112)
Creatinine, Ser: 1.67 mg/dL — ABNORMAL HIGH (ref 0.40–1.20)
GFR: 32.48 mL/min — AB (ref >60.00)
GLUCOSE: 228 mg/dL — AB (ref 70–99)
POTASSIUM: 4.3 meq/L (ref 3.5–5.1)
SODIUM: 139 meq/L (ref 135–145)

## 2015-01-07 LAB — HEMOGLOBIN A1C: Hgb A1c MFr Bld: 7.4 % — ABNORMAL HIGH (ref 4.6–6.5)

## 2015-01-07 LAB — TSH: TSH: 0.95 u[IU]/mL (ref 0.35–4.50)

## 2015-01-10 ENCOUNTER — Ambulatory Visit (INDEPENDENT_AMBULATORY_CARE_PROVIDER_SITE_OTHER): Payer: Medicare PPO | Admitting: Endocrinology

## 2015-01-10 ENCOUNTER — Encounter: Payer: Self-pay | Admitting: Endocrinology

## 2015-01-10 VITALS — BP 134/64 | HR 76 | Temp 98.2°F | Resp 14 | Ht 61.0 in | Wt 171.6 lb

## 2015-01-10 DIAGNOSIS — E1165 Type 2 diabetes mellitus with hyperglycemia: Secondary | ICD-10-CM | POA: Diagnosis not present

## 2015-01-10 DIAGNOSIS — E038 Other specified hypothyroidism: Secondary | ICD-10-CM

## 2015-01-10 DIAGNOSIS — N289 Disorder of kidney and ureter, unspecified: Secondary | ICD-10-CM

## 2015-01-10 DIAGNOSIS — Z794 Long term (current) use of insulin: Secondary | ICD-10-CM

## 2015-01-10 NOTE — Patient Instructions (Addendum)
Hold Metformin for 1 week  Must take Novolog before eating each time you eat a meal  Check blood sugars on waking up 3-4  times a week Also check blood sugars about 2 hours after a meal and do this after different meals by rotation  Recommended blood sugar levels on waking up is 90-130 and about 2 hours after meal is 130-160  Please bring your blood sugar monitor to each visit, thank you

## 2015-01-10 NOTE — Progress Notes (Signed)
Patient ID: April Branch, female   DOB: 01-16-1946, 69 y.o.   MRN: [id number]           Reason for Appointment: Follow-up  for Type 2 Diabetes  Referring physician: Pattricia Boss  History of Present Illness:          Date of diagnosis of type 2 diabetes mellitus: 1980s       Previous history:    She thinks she was treated with metformin for some time before she went on insulin in ?  2002   She has mostly been treated with Lantus and rapid acting insulin such as Apidra or Novolog.   She thinks that Apidra worked fairly fast but insurance did not cover this, generally has had poor control   Recent history:   INSULIN regimen is described as:   Toujeo 60 units in am, 15 Novolog before meals  On her initial consultation she was switched from Lantus to Toujeo twice a day because of blood sugars mostly over 300 throughout the day.  More recently she is taking the Toujeo only once a day and significantly lower amounts Was also started on metformin ER 750 mg, 2 tablets daily  Her A1c looks much better at 7.4 compared to 11.9 previously. However she is recently using a Generic monitor and difficult to get any idea of her postprandial readings and patterns since she is not keeping her diary and not clear when she is taking her sugar  Current blood sugar patterns and problems identified:  FASTING blood sugars are looking fairly good overall, recent range 97-143  NOVOLOG insulin: She now reveals that she is recently taking the Novolog only when her blood sugar is tested an hour after eating and will adjust the dose based on the sugar levels.  This was contrary to what her instructions were previously  She still has significantly high readings after her main meal which is at lunch, highest 356 after eating dessert and otherwise mostly over 200  She did have an episode of low blood sugar once a couple otherwise after taking her postprandial Novolog  Usually not taking her blood sugars in the  evenings recently  She is not able to do much exercise although she is trying to be as active as possible  Oral hypoglycemic drugs the patient is taking are:  Metformin ER 750 mg 1 daily     Side effects from medications have been: none  Compliance with the medical regimen: fair  Hypoglycemia:  less recently  Glucose monitoring:  done  2-4  times a day         Glucometer: Accucheck      Blood Glucose readings by diary as above   Self-care: The diet that the patient has been following is: tries to limit  High-fat foods.     Typical meal intake: Breakfast is variable, does not always eat supper.  Usually has sandwich for lunch               Dietician visit, most recent: never               Exercise:  some slow walking , not able to walk much because of leg pain   Weight history:  Highest 199 , has not lost any weight recently  Wt Readings from Last 3 Encounters:  01/10/15 171 lb 9.6 oz (77.837 kg)  11/29/14 171 lb (77.565 kg)  11/27/14 169 lb (76.658 kg)    Glycemic control:   Lab Results  Component Value Date   HGBA1C 7.4* 01/07/2015   HGBA1C 11.9 10/01/2014   HGBA1C 7.4* 09/22/2012   Lab Results  Component Value Date   MICROALBUR 1.0 10/25/2014   LDLCALC 76 02/23/2008   CREATININE 1.67* 01/07/2015         Medication List       This list is accurate as of: 01/10/15  8:48 PM.  Always use your most recent med list.               ACCU-CHEK AVIVA PLUS test strip  Generic drug:  glucose blood     ACCU-CHEK FASTCLIX LANCETS Misc  Use 4 per day to check blood sugar dx code E11.9     aspirin 81 MG tablet  Take 81 mg by mouth daily.     B-D SINGLE USE SWABS REGULAR Pads     clopidogrel 75 MG tablet  Commonly known as:  PLAVIX  Take 75 mg by mouth daily.     DULoxetine 30 MG capsule  Commonly known as:  CYMBALTA  Take 60 mg by mouth at bedtime. Take two tablets at night 30 mg     DULoxetine 60 MG capsule  Commonly known as:  CYMBALTA  Take 1 capsule  (60 mg total) by mouth daily.     ferrous sulfate 325 (65 FE) MG tablet  Take 325 mg by mouth 2 (two) times daily with a meal.     gabapentin 100 MG capsule  Commonly known as:  NEURONTIN     hydrochlorothiazide 25 MG tablet  Commonly known as:  HYDRODIURIL  Take 25 mg by mouth See admin instructions. Take 1 tablet by mouth every 3 days per patient     HYDROcodone-acetaminophen 10-325 MG tablet  Commonly known as:  NORCO  Take 1 tablet by mouth every 4 (four) hours as needed (pain).     insulin aspart 100 UNIT/ML injection  Commonly known as:  novoLOG  Inject 20 Units into the skin 3 (three) times daily with meals.     Insulin Glargine 300 UNIT/ML Sopn  Commonly known as:  TOUJEO SOLOSTAR  Inject 60 Units into the skin 2 (two) times daily.     Insulin Pen Needle 32G X 4 MM Misc  Commonly known as:  BD PEN NEEDLE NANO U/F  Use to inject insulin     lansoprazole 30 MG capsule  Commonly known as:  PREVACID  Take 1 capsule (30 mg total) by mouth 2 (two) times daily before a meal.     levothyroxine 137 MCG tablet  Commonly known as:  SYNTHROID, LEVOTHROID  Take 1 tablet (137 mcg total) by mouth daily before breakfast.     levothyroxine 137 MCG tablet  Commonly known as:  SYNTHROID, LEVOTHROID  TAKE 1 TABLET (137 MCG TOTAL) BY MOUTH DAILY BEFORE BREAKFAST.     lisinopril 5 MG tablet  Commonly known as:  PRINIVIL,ZESTRIL  Take 1 tablet (5 mg total) by mouth daily.     LORazepam 1 MG tablet  Commonly known as:  ATIVAN  Take 3 mg by mouth at bedtime.     metFORMIN 750 MG 24 hr tablet  Commonly known as:  GLUCOPHAGE XR  take 2 tablets daily     nortriptyline 75 MG capsule  Commonly known as:  PAMELOR  Take 75 mg by mouth at bedtime.     ondansetron 8 MG tablet  Commonly known as:  ZOFRAN  Take 1 tablet (8 mg total) by mouth every 8 (eight)  hours as needed for nausea or vomiting.     potassium chloride 10 MEQ tablet  Commonly known as:  K-DUR  Take 1 tablet (10 mEq  total) by mouth daily.     pravastatin 20 MG tablet  Commonly known as:  PRAVACHOL  Take 20 mg by mouth daily.     tolterodine 4 MG 24 hr capsule  Commonly known as:  DETROL LA  Take 4 mg by mouth daily.        Allergies: No Known Allergies  Past Medical History  Diagnosis Date  . Hyperlipidemia   . Arthritis   . Hypothyroidism   . GERD (gastroesophageal reflux disease)   . Anxiety   . Diabetes mellitus age 17    insulin dependent  . Peripheral vascular disease ([place])   . Lumbar disc disease   . Insulin dependent type 2 diabetes mellitus, controlled ([place])   . GERD 07/20/2006  . CAD (coronary artery disease)   . CAD (coronary artery disease) of bypass graft   . Vertigo   . History of transient ischemic attack (TIA)   . Myocardial infarction ([place])     AGE 54    . Hypertension   . Depression   . COPD 12/14/2008    PATIENT DENIES    . Personal history of malignant neoplasm of kidney(V10.52) 12/14/2008    Laparoscopic biopsy and cryoablation 7/08 Dr. Vernie Shanks   . Headache(784.0)   . Neuromuscular disorder ([place])     PERIPHERAL NEUROPATHY  . Anemia   . Diabetic coma ([place]) Feb. 2014  . Fall at home Jan. 2, 2016  Jan. 13, 2016    Left knee  . Anginal pain ([place])     1 MONTH AGO   . Stroke [facility])     3 MINI STROKES  RT SIDED WEAKNESS  . Fibromyalgia   . HPV (human papilloma virus) infection   . Colon polyps 09/11/2010    Tubular adenoma  . 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 Appendectomy    . Abdominal hysterectomy    . Knee surgery Bilateral     "cartilage"  . Cholecystectomy    . Lung surgery      lung nodule removed from the right side  . Laparascopic cryoablation of left kidney  08/2006    Dr. Gladis Riffle for renal cell cancer  . Bladder suspension    . Shoulder surgery Bilateral     'Spurs"  . Foot surgery    . Pr vein bypass graft,aorto-fem-pop  05/27/2010  .  Cardiac catheterization    . Femoral-popliteal bypass graft  10/12/2011    Procedure: BYPASS GRAFT FEMORAL-POPLITEAL ARTERY;  Surgeon: Elam Dutch, MD;  Location: [facility] OR;  Service: Vascular;  Laterality: Left;  Left Femoral-Popliteal Bypass Graft using 38mm x 80cm Propaten Graft with intraop arteriogram times one.  . Intraoperative arteriogram  10/12/2011    Procedure: INTRA OPERATIVE ARTERIOGRAM;  Surgeon: Elam Dutch, MD;  Location: [place];  Service: Vascular;  Laterality: Left;  . Left heart catheterization with coronary angiogram N/A 05/11/2011    Procedure: LEFT HEART CATHETERIZATION WITH CORONARY ANGIOGRAM;  Surgeon: Jolaine Artist, MD;  Location: [facility] CATH LAB;  Service: Cardiovascular;  Laterality: N/A;  . Abdominal aortagram N/A 08/21/2011    Procedure: ABDOMINAL Maxcine Ham;  Surgeon: Elam Dutch, MD;  Location: [facility] CATH LAB;  Service: Cardiovascular;  Laterality:  N/A;  . Abdominal aortagram N/A 03/16/2014    Procedure: ABDOMINAL Maxcine Ham;  Surgeon: Elam Dutch, MD;  Location: [facility] CATH LAB;  Service: Cardiovascular;  Laterality: N/A;  . Femoral-popliteal bypass graft Left 04/17/2014    Procedure: REDO LEFT FEMORAL-POPLITEAL ARTERY BYPASS USING GORE PROPATEN 82mmx80cm GRAFT;  Surgeon: Elam Dutch, MD;  Location: [place];  Service: Vascular;  Laterality: Left;  . Esophagogastroduodenoscopy (egd) with propofol N/A 05/16/2014    Procedure: ESOPHAGOGASTRODUODENOSCOPY (EGD) WITH PROPOFOL with Balloon dilation;  Surgeon: Milus Banister, MD;  Location: [place];  Service: Endoscopy;  Laterality: N/A;    Family History  Problem Relation Age of Onset  . Heart disease Mother   . Lung cancer Mother   . Cancer Mother     Lung  . Heart disease Father   . Lung cancer Father   . Cancer Father     Lung  . Heart disease Sister     CABG- Open Heart    Social History:  reports that she quit smoking about 9 months ago. Her smoking use included Cigarettes. She has a 12.5 pack-year  smoking history. She has never used smokeless tobacco. She reports that she does not drink alcohol or use illicit drugs.    Review of Systems   She is asking about multiple other problems:  She says her right ear is hurting today without any discharge or headache.  She has had vomiting and diarrhea this week.  Has seen PCP earlier this week.  Symptoms are improving   Lipid history:  She has good control with taking pravastatin but has relatively high triglycerides    Lab Results  Component Value Date   CHOL 130 10/25/2014   HDL 41.00 10/25/2014   LDLCALC 76 02/23/2008   LDLDIRECT 61.0 10/25/2014   TRIG 222.0* 10/25/2014   CHOLHDL 3 10/25/2014           Eyes: she is overdue for eye exam   Hypertension: she is taking lisinopril 5 mg daily only from her PCP Taking HCTZ 12.5 mg q 3 days for occasional edema  Neurological:    Has no numbness, burning but does have pains , burning and some tingling in feet.   He is doing a little better with increasing Cymbalta to 60 mg She is being treated with nortriptyline by PCP also  Long history of thyroid disease, Has had post ablative hypothyroidism, her dose was increased to 137 on the last visit and TSH is back to normal   Lab Results  Component Value Date   TSH 0.95 01/07/2015    Last  Foot exam: Monofilament sensation is markedly reduced across most of the toes and distal plantar surfaces  Physical Examination:  BP 134/64 mmHg  Pulse 76  Temp(Src) 98.2 F (36.8 C)  Resp 14  Ht 5\' 1"  (1.549 m)  Wt 171 lb 9.6 oz (77.837 kg)  BMI 32.44 kg/m2  SpO2 96%  No pedal edema present    ASSESSMENT:  Diabetes type 2, uncontrolled   See history of present illness for detailed discussion of his current management, blood sugar patterns and problems identified   Patient has postprandial hyperglycemia from not taking her mealtime insulin as directed and despite reminders and diabetes education she did not understand the need to take  Novolog before eating rather than when the blood sugar goes up Discussed basal bolus insulin regimen and need to proactively take mealtime insulin before eating based on meal size and also pre-meal blood sugar  She was also advised to go back to using the Accu-Chek meter since she is able to get this now from the local drugstore with insurance coverage Discussed timing and targets of blood sugar testing Since her fasting readings are appearing fairly go she can continue the same dose of Toujeo METFORMIN: Will have to hold this for at least a week since she is having renal dysfunction likely to be related to recent volume depletion   RENAL dysfunction: Likely to be from recent gastroenteritis and volume depletion, blood pressure is not low today. Advised her to hold off on her HCTZ unless she had edema  HYPERLIPIDEMIA: she does need to be on a high intensity statin instead of just 20 mg pravastatin, deferred to PCP  HYPOTHYROID: Adequately replaced with current regimen of 137 g   PLAN:   As above  Follow-up with PCP for various other problems  Consultation with nurse educator again for review of her management   Patient Instructions  Hold Metformin for 1 week  Must take Novolog before eating each time you eat a meal  Check blood sugars on waking up 3-4  times a week Also check blood sugars about 2 hours after a meal and do this after different meals by rotation  Recommended blood sugar levels on waking up is 90-130 and about 2 hours after meal is 130-160  Please bring your blood sugar monitor to each visit, thank you      Counseling time on subjects discussed above is over 50% of today's 25 minute visit    Coni Homesley 01/10/2015, 8:48 PM   Note: This office note was prepared with Dragon voice recognition system technology. Any transcriptional errors that result from this process are unintentional.

## 2015-01-29 ENCOUNTER — Encounter: Payer: Medicare PPO | Attending: Endocrinology | Admitting: Nutrition

## 2015-01-29 DIAGNOSIS — Z713 Dietary counseling and surveillance: Secondary | ICD-10-CM | POA: Insufficient documentation

## 2015-01-29 DIAGNOSIS — Z794 Long term (current) use of insulin: Secondary | ICD-10-CM | POA: Insufficient documentation

## 2015-01-29 DIAGNOSIS — E1165 Type 2 diabetes mellitus with hyperglycemia: Secondary | ICD-10-CM | POA: Insufficient documentation

## 2015-02-08 ENCOUNTER — Other Ambulatory Visit: Payer: Self-pay | Admitting: Internal Medicine

## 2015-02-08 DIAGNOSIS — Z1231 Encounter for screening mammogram for malignant neoplasm of breast: Secondary | ICD-10-CM

## 2015-02-13 ENCOUNTER — Encounter: Payer: Self-pay | Admitting: Family

## 2015-02-19 ENCOUNTER — Other Ambulatory Visit: Payer: Self-pay

## 2015-02-20 ENCOUNTER — Other Ambulatory Visit (INDEPENDENT_AMBULATORY_CARE_PROVIDER_SITE_OTHER): Payer: Medicare PPO

## 2015-02-20 ENCOUNTER — Other Ambulatory Visit: Payer: Self-pay | Admitting: Family

## 2015-02-20 DIAGNOSIS — Z794 Long term (current) use of insulin: Secondary | ICD-10-CM

## 2015-02-20 DIAGNOSIS — E1165 Type 2 diabetes mellitus with hyperglycemia: Secondary | ICD-10-CM | POA: Diagnosis not present

## 2015-02-20 DIAGNOSIS — Z95828 Presence of other vascular implants and grafts: Secondary | ICD-10-CM

## 2015-02-20 DIAGNOSIS — I739 Peripheral vascular disease, unspecified: Secondary | ICD-10-CM

## 2015-02-20 LAB — COMPREHENSIVE METABOLIC PANEL
ALBUMIN: 3.9 g/dL (ref 3.5–5.2)
ALT: 11 U/L (ref 0–35)
AST: 14 U/L (ref 0–37)
Alkaline Phosphatase: 111 U/L (ref 39–117)
BUN: 23 mg/dL (ref 6–23)
CHLORIDE: 101 meq/L (ref 96–112)
CO2: 26 mEq/L (ref 19–32)
CREATININE: 1.19 mg/dL (ref 0.40–1.20)
Calcium: 9.5 mg/dL (ref 8.4–10.5)
GFR: 48.01 mL/min — ABNORMAL LOW (ref >60.00)
Glucose, Bld: 166 mg/dL — ABNORMAL HIGH (ref 70–99)
Potassium: 4.1 mEq/L (ref 3.5–5.1)
SODIUM: 136 meq/L (ref 135–145)
TOTAL PROTEIN: 7.9 g/dL (ref 6.0–8.3)
Total Bilirubin: 0.3 mg/dL (ref 0.2–1.2)

## 2015-02-20 NOTE — Progress Notes (Signed)
Patient reports that she sometimes forgets to take her Novolog before the meal, but then takes it shortly afterwards.  We discussed how the insulin works and the need to take it before she starts eating.  She promised to make a special effort to do this.   Meals are not always balanced.  We discussed the importance of adding protein to breakfast meal.  Suggestions were given for this.  Lunch and supper meals are balanced, but higher in fat.  Discussed ways she can reduce her fat intake.   Insulin dose:  Toujeo 60u in the AM:  10AM novolog 15u ususally before meals.   SBGM:  She did not bring her meter, but says her FBSs are "much better" " In the low 100s most of the time now".  FBS today was 131.  Says blood sugar before supper is more variable--because she sometimes forgets to take her Novolog if she is out somewhere, or if she forgets to take it before the meal.  We discussed the importance of always taking this, even if she goes out.  She agreed to do this.  Discussed how she can increase/decrease the dose of Novolog depending on her carbs for that meal, and at holiday time.  She was told that she can increase/decrease that 15u dose by 1-2u depending on whether she eats more or less carbs.  We discussed what carbs were, and she reported good understanding of this and had no final questions.

## 2015-02-21 ENCOUNTER — Ambulatory Visit (INDEPENDENT_AMBULATORY_CARE_PROVIDER_SITE_OTHER): Payer: Medicare PPO | Admitting: Endocrinology

## 2015-02-21 ENCOUNTER — Ambulatory Visit: Payer: Self-pay | Admitting: Family

## 2015-02-21 ENCOUNTER — Ambulatory Visit (HOSPITAL_COMMUNITY): Payer: Self-pay

## 2015-02-21 ENCOUNTER — Encounter: Payer: Self-pay | Admitting: Endocrinology

## 2015-02-21 VITALS — BP 144/72 | HR 83 | Temp 98.1°F | Resp 14 | Ht 62.0 in | Wt 178.4 lb

## 2015-02-21 DIAGNOSIS — N289 Disorder of kidney and ureter, unspecified: Secondary | ICD-10-CM | POA: Diagnosis not present

## 2015-02-21 DIAGNOSIS — E785 Hyperlipidemia, unspecified: Secondary | ICD-10-CM

## 2015-02-21 DIAGNOSIS — E876 Hypokalemia: Secondary | ICD-10-CM | POA: Diagnosis not present

## 2015-02-21 DIAGNOSIS — E1165 Type 2 diabetes mellitus with hyperglycemia: Secondary | ICD-10-CM

## 2015-02-21 DIAGNOSIS — Z794 Long term (current) use of insulin: Secondary | ICD-10-CM

## 2015-02-21 LAB — FRUCTOSAMINE: FRUCTOSAMINE: 251 umol/L (ref 0–285)

## 2015-02-21 NOTE — Patient Instructions (Addendum)
Check blood sugars on waking up 4-5  times a week Also check blood sugars about 2 hours after a meal and do this after different meals by rotation  Recommended blood sugar levels on waking up is 90-130 and about 2 hours after meal is 130-160  Please bring your blood sugar monitor to each visit, thank you  Novolog 15 for full meals at the meal time, reduce dose if eating less, take pen with you outside  Metformin 2x daily  Take potassium only with fluid pill

## 2015-02-21 NOTE — Progress Notes (Signed)
Patient ID: April Branch, female   DOB: 1946-01-21, 69 y.o.   MRN: [id number]           Reason for Appointment: Follow-up  for Type 2 Diabetes  Referring physician: Pattricia Boss  History of Present Illness:          Date of diagnosis of type 2 diabetes mellitus: 1980s       Previous history:    She thinks she was treated with metformin for some time before she went on insulin in ?  2002   She has mostly been treated with Lantus and rapid acting insulin such as Apidra or Novolog.   She thinks that Apidra worked fairly fast but insurance did not cover this, generally has had poor control   Recent history:   INSULIN regimen is described as:   Toujeo 60 units in am, 10-15 Novolog before meals  On her initial consultation she was switched from Lantus to Toujeo twice a day because of blood sugars mostly over 300 throughout the day.   Now is taking the Toujeo only once a day and compared to her Lantus dose of 80 units she is taking less insulin Was also started on metformin ER 750 mg, 2 tablets daily  Her A1c on the last visit was 7.4 She did start using an Accu-Chek meter since her last visit and able to review her glucose readings today However her time on the meter is off by about 10 hours and not easy to analyze the readings  Current blood sugar patterns and problems identified:  FASTING blood sugars are mostly higher in the last week or so and she does not know why, previously stable  She has mostly high readings recently after meals including after lunch and periodically after supper also  Last night had a low blood sugar when she did not eat much and took her insulin  She was supposed to take 15 units of NOVOLOG but she is only taking 10 units and not adjusting based on her blood sugars  Difficult to get a consistent pattern on her blood sugars recently with variability at all times  Recently less active because of significant back pain  Her metformin was reduced to 1 tablet  when her renal function was impaired  Oral hypoglycemic drugs the patient is taking are:  Metformin ER 750 mg 1 daily     Side effects from medications have been: none  Compliance with the medical regimen: fair  Hypoglycemia:  less recently  Glucose monitoring:  done  2-4  times a day         Glucometer: Accucheck      Blood Glucose readings by monitor download:  Mean values apply above for all meters except median for One Touch  PRE-MEAL Fasting Lunch Dinner Bedtime Overall  Glucose range:  83-218     49-247    Mean/median:  133     170   169      Self-care: The diet that the patient has been following is: tries to limit  High-fat foods.     Typical meal intake: Breakfast is variable, does not always eat supper.  Usually has sandwich for lunch               Dietician visit, most recent: never               Exercise:  some slow walking , not able to walk much because of leg pain   Weight history:  Highest 199 , has not lost any weight recently  Wt Readings from Last 3 Encounters:  02/21/15 178 lb 6.4 oz (80.922 kg)  01/10/15 171 lb 9.6 oz (77.837 kg)  11/29/14 171 lb (77.565 kg)    Glycemic control:   Lab Results  Component Value Date   HGBA1C 7.4* 01/07/2015   HGBA1C 11.9 10/01/2014   HGBA1C 7.4* 09/22/2012   Lab Results  Component Value Date   MICROALBUR 1.0 10/25/2014   LDLCALC 76 02/23/2008   CREATININE 1.19 02/20/2015         Medication List       This list is accurate as of: 02/21/15  1:16 PM.  Always use your most recent med list.               ACCU-CHEK AVIVA PLUS test strip  Generic drug:  glucose blood     ACCU-CHEK FASTCLIX LANCETS Misc  Use 4 per day to check blood sugar dx code E11.9     aspirin 81 MG tablet  Take 81 mg by mouth daily.     B-D SINGLE USE SWABS REGULAR Pads     clopidogrel 75 MG tablet  Commonly known as:  PLAVIX  Take 75 mg by mouth daily.     DULoxetine 30 MG capsule  Commonly known as:  CYMBALTA  Take 60 mg  by mouth at bedtime. Take two tablets at night 30 mg     DULoxetine 60 MG capsule  Commonly known as:  CYMBALTA  Take 1 capsule (60 mg total) by mouth daily.     ferrous sulfate 325 (65 FE) MG tablet  Take 325 mg by mouth 2 (two) times daily with a meal.     gabapentin 100 MG capsule  Commonly known as:  NEURONTIN     hydrochlorothiazide 25 MG tablet  Commonly known as:  HYDRODIURIL  Take 25 mg by mouth See admin instructions. Take 1 tablet by mouth every 3 days per patient     HYDROcodone-acetaminophen 10-325 MG tablet  Commonly known as:  NORCO  Take 1 tablet by mouth every 4 (four) hours as needed (pain).     Insulin Glargine 300 UNIT/ML Sopn  Commonly known as:  TOUJEO SOLOSTAR  Inject 60 Units into the skin 2 (two) times daily.     Insulin Pen Needle 32G X 4 MM Misc  Commonly known as:  BD PEN NEEDLE NANO U/F  Use to inject insulin     lansoprazole 30 MG capsule  Commonly known as:  PREVACID  Take 1 capsule (30 mg total) by mouth 2 (two) times daily before a meal.     levothyroxine 137 MCG tablet  Commonly known as:  SYNTHROID, LEVOTHROID  TAKE 1 TABLET (137 MCG TOTAL) BY MOUTH DAILY BEFORE BREAKFAST.     lisinopril 5 MG tablet  Commonly known as:  PRINIVIL,ZESTRIL  Take 1 tablet (5 mg total) by mouth daily.     LORazepam 1 MG tablet  Commonly known as:  ATIVAN  Take 3 mg by mouth at bedtime.     metFORMIN 750 MG 24 hr tablet  Commonly known as:  GLUCOPHAGE XR  take 2 tablets daily     nortriptyline 75 MG capsule  Commonly known as:  PAMELOR  Take 75 mg by mouth at bedtime.     NOVOLOG FLEXPEN 100 UNIT/ML FlexPen  Generic drug:  insulin aspart  INJECT 10-15 UNITS INTO THE SKIN 3 TIMES A DAY WITH MEALS     ondansetron  8 MG tablet  Commonly known as:  ZOFRAN  Take 1 tablet (8 mg total) by mouth every 8 (eight) hours as needed for nausea or vomiting.     potassium chloride 10 MEQ tablet  Commonly known as:  K-DUR  Take 1 tablet (10 mEq total) by mouth  daily.     pravastatin 20 MG tablet  Commonly known as:  PRAVACHOL  Take 20 mg by mouth daily.     tolterodine 4 MG 24 hr capsule  Commonly known as:  DETROL LA  Take 4 mg by mouth daily.        Allergies: No Known Allergies  Past Medical History  Diagnosis Date  . Hyperlipidemia   . Arthritis   . Hypothyroidism   . GERD (gastroesophageal reflux disease)   . Anxiety   . Diabetes mellitus age 89    insulin dependent  . Peripheral vascular disease ([place])   . Lumbar disc disease   . Insulin dependent type 2 diabetes mellitus, controlled ([place])   . GERD 07/20/2006  . CAD (coronary artery disease)   . CAD (coronary artery disease) of bypass graft   . Vertigo   . History of transient ischemic attack (TIA)   . Myocardial infarction ([place])     AGE 59    . Hypertension   . Depression   . COPD 12/14/2008    PATIENT DENIES    . Personal history of malignant neoplasm of kidney(V10.52) 12/14/2008    Laparoscopic biopsy and cryoablation 7/08 Dr. Vernie Shanks   . Headache(784.0)   . Neuromuscular disorder ([place])     PERIPHERAL NEUROPATHY  . Anemia   . Diabetic coma ([place]) Feb. 2014  . Fall at home Jan. 2, 2016  Jan. 13, 2016    Left knee  . Anginal pain ([place])     1 MONTH AGO   . Stroke [facility])     3 MINI STROKES  RT SIDED WEAKNESS  . Fibromyalgia   . HPV (human papilloma virus) infection   . Colon polyps 09/11/2010    Tubular adenoma  . 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 Appendectomy    . Abdominal hysterectomy    . Knee surgery Bilateral     "cartilage"  . Cholecystectomy    . Lung surgery      lung nodule removed from the right side  . Laparascopic cryoablation of left kidney  08/2006    Dr. Gladis Riffle for renal cell cancer  . Bladder suspension    . Shoulder surgery Bilateral     'Spurs"  . Foot surgery    . Pr vein bypass graft,aorto-fem-pop  05/27/2010  . Cardiac  catheterization    . Femoral-popliteal bypass graft  10/12/2011    Procedure: BYPASS GRAFT FEMORAL-POPLITEAL ARTERY;  Surgeon: Elam Dutch, MD;  Location: [facility] OR;  Service: Vascular;  Laterality: Left;  Left Femoral-Popliteal Bypass Graft using 42mm x 80cm Propaten Graft with intraop arteriogram times one.  . Intraoperative arteriogram  10/12/2011    Procedure: INTRA OPERATIVE ARTERIOGRAM;  Surgeon: Elam Dutch, MD;  Location: [place];  Service: Vascular;  Laterality: Left;  . Left heart catheterization with coronary angiogram N/A 05/11/2011    Procedure: LEFT HEART CATHETERIZATION WITH CORONARY ANGIOGRAM;  Surgeon: Jolaine Artist, MD;  Location: [facility] CATH LAB;  Service: Cardiovascular;  Laterality: N/A;  . Abdominal aortagram N/A 08/21/2011  Procedure: ABDOMINAL AORTAGRAM;  Surgeon: Elam Dutch, MD;  Location: [facility] CATH LAB;  Service: Cardiovascular;  Laterality: N/A;  . Abdominal aortagram N/A 03/16/2014    Procedure: ABDOMINAL Maxcine Ham;  Surgeon: Elam Dutch, MD;  Location: [facility] CATH LAB;  Service: Cardiovascular;  Laterality: N/A;  . Femoral-popliteal bypass graft Left 04/17/2014    Procedure: REDO LEFT FEMORAL-POPLITEAL ARTERY BYPASS USING GORE PROPATEN 15mmx80cm GRAFT;  Surgeon: Elam Dutch, MD;  Location: [place];  Service: Vascular;  Laterality: Left;  . Esophagogastroduodenoscopy (egd) with propofol N/A 05/16/2014    Procedure: ESOPHAGOGASTRODUODENOSCOPY (EGD) WITH PROPOFOL with Balloon dilation;  Surgeon: Milus Banister, MD;  Location: [place];  Service: Endoscopy;  Laterality: N/A;    Family History  Problem Relation Age of Onset  . Heart disease Mother   . Lung cancer Mother   . Cancer Mother     Lung  . Heart disease Father   . Lung cancer Father   . Cancer Father     Lung  . Heart disease Sister     CABG- Open Heart    Social History:  reports that she quit smoking about 11 months ago. Her smoking use included Cigarettes. She has a 12.5 pack-year smoking  history. She has never used smokeless tobacco. She reports that she does not drink alcohol or use illicit drugs.    Review of Systems   She is having more back pain especially on movements and has not scheduled follow-up  Lipid history:  She has good control with taking pravastatin but has relatively high triglycerides    Lab Results  Component Value Date   CHOL 130 10/25/2014   HDL 41.00 10/25/2014   LDLCALC 76 02/23/2008   LDLDIRECT 61.0 10/25/2014   TRIG 222.0* 10/25/2014   CHOLHDL 3 10/25/2014           Eyes: she is overdue for eye exam   Hypertension: she is taking lisinopril 5 mg daily only from her PCP Taking HCTZ 12.5 mg q 3 days for occasional edema, still on potassium supplements  Neurological:    Has no numbness, burning but does have pains , burning and some tingling in feet.   Symptoms better with increasing Cymbalta to 60 mg She is being treated with nortriptyline by PCP also  Long history of thyroid disease, Has had post ablative hypothyroidism, her dose was increased to 137 previously   Lab Results  Component Value Date   TSH 0.95 01/07/2015    Last  Foot exam: Monofilament sensation is markedly reduced across most of the toes and distal plantar surfaces  Physical Examination:  BP 144/72 mmHg  Pulse 83  Temp(Src) 98.1 F (36.7 C)  Resp 14  Ht 5\' 2"  (1.575 m)  Wt 178 lb 6.4 oz (80.922 kg)  BMI 32.62 kg/m2  SpO2 96%     ASSESSMENT:  Diabetes type 2, uncontrolled   See history of present illness for detailed discussion of his current management, blood sugar patterns and problems identified  Her blood sugars are very erratic recently and mostly high Again she is not taking enough Novolog and she does not understand the need to take enough to cover her meals resulting in frequent readings over 200 recently Also occasionally she has had decreased appetite and will still take her 10 units Novolog Previously was instructed on taking 15 units for  average size meals. Today have reviewed her monitor but since that time is off by significant number of hours difficult to analyze  the readings and not clear which readings her postprandial METFORMIN: Will increase the dose back to 1500 mg a day and this may help her recent fasting hyperglycemia also   RENAL dysfunction: Resolved   HYPERLIPIDEMIA: she does need to be on a high intensity statin instead of just 20 mg pravastatin, deferred to PCP  HYPOTHYROID: Adequately replaced with current regimen of 137 g   PLAN:   Increase metformin  No change in Toujeo as yet  Take 15 units Novolog consistently before eating for moderate size meals and go up or down 4-6 units for smaller or larger meals  May not need to take any if eating only a snack instead of a meal  Follow-up with PCP for various other problems  Her monitor was changed to the correct time today  May take potassium only on the days taking HCTZ   Patient Instructions  Check blood sugars on waking up 4-5  times a week Also check blood sugars about 2 hours after a meal and do this after different meals by rotation  Recommended blood sugar levels on waking up is 90-130 and about 2 hours after meal is 130-160  Please bring your blood sugar monitor to each visit, thank you  Novolog 15 for full meals at the meal time, reduce dose if eating less, take pen with you outside  Metformin 2x daily  Take potassium only with fluid pill   Counseling time on subjects discussed above is over 50% of today's 25 minute visit    Audy Dauphine 02/21/2015, 1:16 PM   Note: This office note was prepared with Estate agent. Any transcriptional errors that result from this process are unintentional.

## 2015-02-26 NOTE — Patient Instructions (Signed)
Add one ounce of protein with breakfast meal each day Take Novolog before each meal.  If you forget, you can take it immediately after the meal. Bring meter to all visits

## 2015-02-28 ENCOUNTER — Ambulatory Visit: Payer: Self-pay

## 2015-03-29 ENCOUNTER — Encounter: Payer: Self-pay | Admitting: Family

## 2015-04-01 ENCOUNTER — Other Ambulatory Visit: Payer: Self-pay | Admitting: Endocrinology

## 2015-04-01 ENCOUNTER — Telehealth: Payer: Self-pay | Admitting: *Deleted

## 2015-04-01 NOTE — Telephone Encounter (Signed)
ok 

## 2015-04-01 NOTE — Telephone Encounter (Signed)
rx sent

## 2015-04-01 NOTE — Telephone Encounter (Signed)
Patient is requesting refill of Cymbalta 60 mg, it was filled by [place] back in October.  Is it okay to refill?

## 2015-04-05 ENCOUNTER — Encounter (HOSPITAL_COMMUNITY): Payer: Self-pay

## 2015-04-05 ENCOUNTER — Other Ambulatory Visit (HOSPITAL_COMMUNITY): Payer: Self-pay

## 2015-04-05 ENCOUNTER — Ambulatory Visit: Payer: Self-pay | Admitting: Family

## 2015-04-09 ENCOUNTER — Emergency Department (HOSPITAL_COMMUNITY)
Admission: EM | Admit: 2015-04-09 | Discharge: 2015-04-09 | Disposition: A | Discharge status: Home / Self Care | Payer: Self-pay | Attending: Emergency Medicine | Admitting: Emergency Medicine

## 2015-04-09 ENCOUNTER — Emergency Department (HOSPITAL_COMMUNITY): Payer: Self-pay

## 2015-04-09 ENCOUNTER — Emergency Department (HOSPITAL_BASED_OUTPATIENT_CLINIC_OR_DEPARTMENT_OTHER)
Admit: 2015-04-09 | Discharge: 2015-04-09 | Disposition: A | Discharge status: Home / Self Care | Payer: Self-pay | Attending: Emergency Medicine | Admitting: Emergency Medicine

## 2015-04-09 ENCOUNTER — Encounter (HOSPITAL_COMMUNITY): Payer: Self-pay | Admitting: Neurology

## 2015-04-09 DIAGNOSIS — Z79899 Other long term (current) drug therapy: Secondary | ICD-10-CM | POA: Insufficient documentation

## 2015-04-09 DIAGNOSIS — Z8673 Personal history of transient ischemic attack (TIA), and cerebral infarction without residual deficits: Secondary | ICD-10-CM | POA: Insufficient documentation

## 2015-04-09 DIAGNOSIS — E039 Hypothyroidism, unspecified: Secondary | ICD-10-CM | POA: Insufficient documentation

## 2015-04-09 DIAGNOSIS — E119 Type 2 diabetes mellitus without complications: Secondary | ICD-10-CM | POA: Insufficient documentation

## 2015-04-09 DIAGNOSIS — M25562 Pain in left knee: Secondary | ICD-10-CM | POA: Insufficient documentation

## 2015-04-09 DIAGNOSIS — I25119 Atherosclerotic heart disease of native coronary artery with unspecified angina pectoris: Secondary | ICD-10-CM | POA: Insufficient documentation

## 2015-04-09 DIAGNOSIS — Z794 Long term (current) use of insulin: Secondary | ICD-10-CM | POA: Insufficient documentation

## 2015-04-09 DIAGNOSIS — E785 Hyperlipidemia, unspecified: Secondary | ICD-10-CM | POA: Insufficient documentation

## 2015-04-09 DIAGNOSIS — Z7984 Long term (current) use of oral hypoglycemic drugs: Secondary | ICD-10-CM | POA: Insufficient documentation

## 2015-04-09 DIAGNOSIS — M79609 Pain in unspecified limb: Secondary | ICD-10-CM

## 2015-04-09 DIAGNOSIS — I10 Essential (primary) hypertension: Secondary | ICD-10-CM | POA: Insufficient documentation

## 2015-04-09 DIAGNOSIS — Z85528 Personal history of other malignant neoplasm of kidney: Secondary | ICD-10-CM | POA: Insufficient documentation

## 2015-04-09 DIAGNOSIS — I252 Old myocardial infarction: Secondary | ICD-10-CM | POA: Insufficient documentation

## 2015-04-09 DIAGNOSIS — K219 Gastro-esophageal reflux disease without esophagitis: Secondary | ICD-10-CM | POA: Insufficient documentation

## 2015-04-09 DIAGNOSIS — Z86010 Personal history of colonic polyps: Secondary | ICD-10-CM | POA: Insufficient documentation

## 2015-04-09 DIAGNOSIS — M79662 Pain in left lower leg: Secondary | ICD-10-CM

## 2015-04-09 DIAGNOSIS — Z87891 Personal history of nicotine dependence: Secondary | ICD-10-CM | POA: Insufficient documentation

## 2015-04-09 DIAGNOSIS — Z7902 Long term (current) use of antithrombotics/antiplatelets: Secondary | ICD-10-CM | POA: Insufficient documentation

## 2015-04-09 DIAGNOSIS — Z7982 Long term (current) use of aspirin: Secondary | ICD-10-CM | POA: Insufficient documentation

## 2015-04-09 DIAGNOSIS — J449 Chronic obstructive pulmonary disease, unspecified: Secondary | ICD-10-CM | POA: Insufficient documentation

## 2015-04-09 LAB — CBC
HEMATOCRIT: 36.7 % (ref 36.0–46.0)
HEMOGLOBIN: 11.4 g/dL — AB (ref 12.0–15.0)
MCH: 26 pg (ref 26.0–34.0)
MCHC: 31.1 g/dL (ref 30.0–36.0)
MCV: 83.6 fL (ref 78.0–100.0)
Platelets: 318 10*3/uL (ref 150–400)
RBC: 4.39 MIL/uL (ref 3.87–5.11)
RDW: 14.2 % (ref 11.5–15.5)
WBC: 8 10*3/uL (ref 4.0–10.5)

## 2015-04-09 LAB — BASIC METABOLIC PANEL
Anion gap: 13 (ref 5–15)
BUN: 17 mg/dL (ref 6–20)
CHLORIDE: 103 mmol/L (ref 101–111)
CO2: 23 mmol/L (ref 22–32)
CREATININE: 1.21 mg/dL — AB (ref 0.44–1.00)
Calcium: 9.2 mg/dL (ref 8.9–10.3)
GFR calc non Af Amer: 45 mL/min — ABNORMAL LOW (ref >60)
GFR, EST AFRICAN AMERICAN: 52 mL/min — AB (ref >60)
Glucose, Bld: 269 mg/dL — ABNORMAL HIGH (ref 65–99)
POTASSIUM: 4.4 mmol/L (ref 3.5–5.1)
Sodium: 139 mmol/L (ref 135–145)

## 2015-04-09 MED ORDER — MORPHINE SULFATE (PF) 4 MG/ML IV SOLN
4.0000 mg | Freq: Once | INTRAVENOUS | Status: AC
  Administered 2015-04-09: 4 mg via INTRAVENOUS
  Filled 2015-04-09: qty 1

## 2015-04-09 MED ORDER — OXYCODONE-ACETAMINOPHEN 5-325 MG PO TABS: 1.0000 | ORAL_TABLET | Freq: Four times a day (QID) | ORAL | Status: DC | PRN

## 2015-04-09 MED ORDER — OXYCODONE-ACETAMINOPHEN 5-325 MG PO TABS
2.0000 | ORAL_TABLET | Freq: Once | ORAL | Status: AC
  Administered 2015-04-09: 2 via ORAL
  Filled 2015-04-09: qty 2

## 2015-04-09 NOTE — ED Provider Notes (Signed)
CSN: [id number]     Arrival date & time 04/09/15  0809 History   First MD Initiated Contact with Patient 04/09/15 [phone]     Chief Complaint  Patient presents with  . Leg Pain     (Consider location/radiation/quality/duration/timing/severity/associated sxs/prior Treatment) The history is provided by the patient and medical records. No language interpreter was used.     April Branch is a 70 y.o. female  with a hx of lipidemia, arthritis, GERD, anxiety, insulin-dependent diabetes, peripheral vascular disease, coronary artery disease, TIA, MI, hypertension, DVT in spite of anticoagulation with multiple surgeries on the left leg by Dr. Oneida Alar for removal of clots presents to the Emergency Department complaining of gradual, persistent, progressively worsening left calf pain onset approximately 1 week ago.  He denies swelling or erythema of the left lower leg but has continued pain, worse with movement and palpation. Patient saw her primary care several days ago and was given hydrocodone and Flexeril without relief. Nothing really makes the symptoms better. She denies periods of immobilization, recent surgery or exogenous estrogen.   She also denies fever, chills, nausea, vomiting, chest pain, shortness of breath, palpitations.  He is anticoagulated on Plavix and denies missed doses.   Past Medical History  Diagnosis Date  . Hyperlipidemia   . Arthritis   . Hypothyroidism   . GERD (gastroesophageal reflux disease)   . Anxiety   . Diabetes mellitus age 15    insulin dependent  . Peripheral vascular disease ([place])   . Lumbar disc disease   . Insulin dependent type 2 diabetes mellitus, controlled ([place])   . GERD 07/20/2006  . CAD (coronary artery disease)   . CAD (coronary artery disease) of bypass graft   . Vertigo   . History of transient ischemic attack (TIA)   . Myocardial infarction ([place])     AGE 48    . Hypertension   . Depression   . COPD 12/14/2008    PATIENT DENIES    .  Personal history of malignant neoplasm of kidney(V10.52) 12/14/2008    Laparoscopic biopsy and cryoablation 7/08 Dr. Vernie Shanks   . Headache(784.0)   . Neuromuscular disorder ([place])     PERIPHERAL NEUROPATHY  . Anemia   . Diabetic coma ([place]) Feb. 2014  . Fall at home Jan. 2, 2016  Jan. 13, 2016    Left knee  . Anginal pain ([place])     1 MONTH AGO   . Stroke [facility])     3 MINI STROKES  RT SIDED WEAKNESS  . Fibromyalgia   . HPV (human papilloma virus) infection   . Colon polyps 09/11/2010    Tubular adenoma  . 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 Appendectomy    . Abdominal hysterectomy    . Knee surgery Bilateral     "cartilage"  . Cholecystectomy    . Lung surgery      lung nodule removed from the right side  . Laparascopic cryoablation of left kidney  08/2006    Dr. Gladis Riffle for renal cell cancer  . Bladder suspension    . Shoulder surgery Bilateral     'Spurs"  . Foot surgery    . Pr vein bypass graft,aorto-fem-pop  05/27/2010  . Cardiac catheterization    . Femoral-popliteal bypass graft  10/12/2011    Procedure: BYPASS GRAFT FEMORAL-POPLITEAL ARTERY;  Surgeon: Elam Dutch, MD;  Location: [facility] OR;  Service: Vascular;  Laterality: Left;  Left Femoral-Popliteal Bypass Graft using 56mm x 80cm Propaten Graft with intraop arteriogram times one.  . Intraoperative arteriogram  10/12/2011    Procedure: INTRA OPERATIVE ARTERIOGRAM;  Surgeon: Elam Dutch, MD;  Location: [place];  Service: Vascular;  Laterality: Left;  . Left heart catheterization with coronary angiogram N/A 05/11/2011    Procedure: LEFT HEART CATHETERIZATION WITH CORONARY ANGIOGRAM;  Surgeon: Jolaine Artist, MD;  Location[facility] C[facility] LAB;  Service: Cardiovascular;  Laterality: N/A;  . Abdominal aortagram N/A 08/21/2011    Procedure: ABDOMINAL Maxcine Ham;  Surgeon: Elam Dutch, MD;  Location: [facility] CATH LAB;  Service:  Cardiovascular;  Laterality: N/A;  . Abdominal aortagram N/A 03/16/2014    Procedure: ABDOMINAL Maxcine Ham;  Surgeon: Elam Dutch, MD;  Location: [facility] CATH LAB;  Service: Cardiovascular;  Laterality: N/A;  . Femoral-popliteal bypass graft Left 04/17/2014    Procedure: REDO LEFT FEMORAL-POPLITEAL ARTERY BYPASS USING GORE PROPATEN 34mmx80cm GRAFT;  Surgeon: Elam Dutch, MD;  Location: [place];  Service: Vascular;  Laterality: Left;  . Esophagogastroduodenoscopy (egd) with propofol N/A 05/16/2014    Procedure: ESOPHAGOGASTRODUODENOSCOPY (EGD) WITH PROPOFOL with Balloon dilation;  Surgeon: Milus Banister, MD;  Location: [place];  Service: Endoscopy;  Laterality: N/A;   Family History  Problem Relation Age of Onset  . Heart disease Mother   . Lung cancer Mother   . Cancer Mother     Lung  . Heart disease Father   . Lung cancer Father   . Cancer Father     Lung  . Heart disease Sister     CABG- Open Heart   Social History  Substance Use Topics  . Smoking status: Former Smoker -- 0.25 packs/day for 50 years    Types: Cigarettes    Quit date: 03/17/2014  . Smokeless tobacco: Never Used  . Alcohol Use: No   OB History    No data available     Review of Systems  Constitutional: Negative for fever, diaphoresis, appetite change, fatigue and unexpected weight change.  HENT: Negative for mouth sores.   Eyes: Negative for visual disturbance.  Respiratory: Negative for cough, chest tightness, shortness of breath and wheezing.   Cardiovascular: Negative for chest pain.  Gastrointestinal: Negative for nausea, vomiting, abdominal pain, diarrhea and constipation.  Endocrine: Negative for polydipsia, polyphagia and polyuria.  Genitourinary: Negative for dysuria, urgency, frequency and hematuria.  Musculoskeletal: Positive for myalgias and arthralgias. Negative for back pain and neck stiffness.  Skin: Negative for rash.  Allergic/Immunologic: Negative for immunocompromised state.   Neurological: Negative for syncope, light-headedness and headaches.  Hematological: Does not bruise/bleed easily.  Psychiatric/Behavioral: Negative for sleep disturbance. The patient is not nervous/anxious.       Allergies  Review of patient's allergies indicates no known allergies.  Home Medications   Prior to Admission medications   Medication Sig Start Date End Date Taking? Authorizing Provider  ACCU-CHEK AVIVA PLUS test strip  01/24/14   Historical Provider, MD  ACCU-CHEK FASTCLIX LANCETS MISC Use 4 per day to check blood sugar dx code E11.9 11/29/14   Elayne Snare, MD  Alcohol Swabs (B-D SINGLE USE SWABS REGULAR) PADS  03/21/13   Historical Provider, MD  aspirin 81 MG tablet Take 81 mg by mouth daily.    Historical Provider, MD  clopidogrel (PLAVIX) 75 MG tablet Take 75 mg by mouth daily.  04/02/14   Historical Provider, MD  DULoxetine (CYMBALTA) 30 MG capsule Take 60  mg by mouth at bedtime. Take two tablets at night 30 mg    Historical Provider, MD  DULoxetine (CYMBALTA) 60 MG capsule TAKE ONE CAPSULE BY MOUTH EVERY DAY 04/01/15   Elayne Snare, MD  ferrous sulfate 325 (65 FE) MG tablet Take 325 mg by mouth 2 (two) times daily with a meal.    Historical Provider, MD  gabapentin (NEURONTIN) 100 MG capsule  01/07/15   Historical Provider, MD  hydrochlorothiazide (HYDRODIURIL) 25 MG tablet Take 25 mg by mouth See admin instructions. Take 1 tablet by mouth every 3 days per patient 07/10/13   Historical Provider, MD  HYDROcodone-acetaminophen (NORCO) 10-325 MG per tablet Take 1 tablet by mouth every 4 (four) hours as needed (pain). 04/20/14   Alvia Grove, PA-C  Insulin Glargine (TOUJEO SOLOSTAR) 300 UNIT/ML SOPN Inject 60 Units into the skin 2 (two) times daily. Patient taking differently: Inject 60 Units into the skin daily.  10/01/14   Elayne Snare, MD  Insulin Pen Needle (BD PEN NEEDLE NANO U/F) 32G X 4 MM MISC Use to inject insulin 10/30/14   Elayne Snare, MD  KLOR-CON 10 10 MEQ tablet TAKE 1  TABLET BY MOUTH EVERY DAY 04/01/15   Elayne Snare, MD  lansoprazole (PREVACID) 30 MG capsule Take 1 capsule (30 mg total) by mouth 2 (two) times daily before a meal. 05/16/14   Milus Banister, MD  levothyroxine (SYNTHROID, LEVOTHROID) 137 MCG tablet TAKE 1 TABLET (137 MCG TOTAL) BY MOUTH DAILY BEFORE BREAKFAST. 12/27/14   Elayne Snare, MD  lisinopril (PRINIVIL,ZESTRIL) 5 MG tablet Take 1 tablet (5 mg total) by mouth daily. 11/29/14   Elayne Snare, MD  LORazepam (ATIVAN) 1 MG tablet Take 3 mg by mouth at bedtime.    Historical Provider, MD  metFORMIN (GLUCOPHAGE XR) 750 MG 24 hr tablet take 2 tablets daily 11/30/14   Elayne Snare, MD  nortriptyline (PAMELOR) 75 MG capsule Take 75 mg by mouth at bedtime. 01/24/14   Historical Provider, MD  NOVOLOG FLEXPEN 100 UNIT/ML FlexPen INJECT 10-15 UNITS INTO THE SKIN 3 TIMES A DAY WITH MEALS 01/26/15   Historical Provider, MD  ondansetron (ZOFRAN) 8 MG tablet Take 1 tablet (8 mg total) by mouth every 8 (eight) hours as needed for nausea or vomiting. 11/27/14   Daleen Bo, MD  oxyCODONE-acetaminophen (PERCOCET) 5-325 MG tablet Take 1-2 tablets by mouth every 6 (six) hours as needed. 04/09/15   Shigeo Baugh, PA-C  pravastatin (PRAVACHOL) 20 MG tablet Take 20 mg by mouth daily.    Historical Provider, MD  tolterodine (DETROL LA) 4 MG 24 hr capsule Take 4 mg by mouth daily.     Historical Provider, MD   BP 130/66 mmHg  Pulse 77  Temp(Src) 98 F (36.7 C) (Oral)  Resp 18  SpO2 98% Physical Exam  Constitutional: She appears well-developed and well-nourished. No distress.  Awake, alert, nontoxic appearance  HENT:  Head: Normocephalic and atraumatic.  Mouth/Throat: Oropharynx is clear and moist. No oropharyngeal exudate.  Eyes: Conjunctivae are normal. No scleral icterus.  Neck: Normal range of motion. Neck supple.  Cardiovascular: Normal rate, regular rhythm, normal heart sounds and intact distal pulses.   No murmur heard. Pulses:      Radial pulses are 2+ on the  right side, and 2+ on the left side.       Dorsalis pedis pulses are 1+ on the right side, and 1+ on the left side.  Capillary refill less than 3 seconds in bilateral lower extremities  Pulmonary/Chest: Effort normal and breath sounds normal. No respiratory distress. She has no wheezes.  Equal chest expansion  Abdominal: Soft. Bowel sounds are normal. She exhibits no mass. There is no tenderness. There is no rebound and no guarding.  Musculoskeletal: Normal range of motion. She exhibits no edema.  Left hip: Full range of motion, no tenderness to palpation Left knee: Minimal joint effusion, tenderness to palpation along the joint line, slightly decreased action with full extension, patellar tendon intact Left ankle: Reproducible calf pain with movement of left ankle but no joint line tenderness, swelling or ecchymosis Tenderness to palpation of the left calf without palpable cord, positive Homans sign  Neurological: She is alert.  Speech is clear and goal oriented Moves extremities without ataxia  Skin: Skin is warm and dry. She is not diaphoretic.  Psychiatric: She has a normal mood and affect.  Nursing note and vitals reviewed.   ED Course  Procedures (including critical care time) Labs Review Labs Reviewed  CBC - Abnormal; Notable for the following:    Hemoglobin 11.4 (*)    All other components within normal limits  BASIC METABOLIC PANEL - Abnormal; Notable for the following:    Glucose, Bld 269 (*)    Creatinine, Ser 1.21 (*)    GFR calc non Af Amer 45 (*)    GFR calc Af Amer 52 (*)    All other components within normal limits    Imaging Review Dg Knee Complete 4 Views Left  04/09/2015  CLINICAL DATA:  Left knee pain after multiple falls in the last month. EXAM: LEFT KNEE - COMPLETE 4+ VIEW COMPARISON:  None. FINDINGS: There is no evidence of fracture, dislocation, or joint effusion. There is no evidence of arthropathy or other focal bone abnormality. Surgical clips in  arterial bypass are seen in the posterior soft tissues. IMPRESSION: No significant abnormality seen in the left knee. Electronically Signed   By: Marijo Conception, M.D.   On: 04/09/2015 10:03   I have personally reviewed and evaluated these images and lab results as part of my medical decision-making.     VASCULAR LAB PRELIMINARY PRELIMINARY PRELIMINARY PRELIMINARY  Left lower extremity venous duplex completed.   Preliminary report: Left: No evidence of DVT, superficial thrombosis, or Baker's cyst.  Cestone,Helene, RVT       MDM   Final diagnoses:  Calf pain, left  Left knee pain   Modena Slater resents with left calf pain and history of DVT even while anticoagulated. She is currently on Plavix and has not missed doses. Tenderness to palpation of the left calf and positive Homans sign. Patient also with tenderness to palpation of the left knee. X-ray without fracture dislocation or joint effusion.  Evidence of septic joint. She has almost complete range of motion. No erythema or increased warmth. No palpable Baker's cyst.  No evidence of cellulitis.  Vascular lab with no evidence of DVT, superficial thrombosis or Baker's cyst. Discussed with patient. Will increase pain control and have her follow with orthopedics for further evaluation. She is to return to the emergency department for chest pain, shortness of breath, swelling or leg or other concerns.  Jarrett Soho [place], PA-C 04/09/15 [place], MD 04/09/15 1622

## 2015-04-09 NOTE — Discharge Instructions (Signed)
1. Medications: Percocet, usual home medications 2. Treatment: rest, drink plenty of fluids,  3. Follow Up: Please followup with orthopedics in 7-10 days for discussion of your diagnoses and further evaluation after today's visit; if you do not have a primary care doctor use the resource guide provided to find one; Please return to the ER for  worsening symptoms, chest pain or shortness of breath, high fevers or other concerns    Joint Pain Joint pain, which is also called arthralgia, can be caused by many things. Joint pain often goes away when you follow your health care provider's instructions for relieving pain at home. However, joint pain can also be caused by conditions that require further treatment. Common causes of joint pain include:  Bruising in the area of the joint.  Overuse of the joint.  Wear and tear on the joints that occur with aging (osteoarthritis).  Various other forms of arthritis.  A buildup of a crystal form of uric acid in the joint (gout).  Infections of the joint (septic arthritis) or of the bone (osteomyelitis). Your health care provider may recommend medicine to help with the pain. If your joint pain continues, additional tests may be needed to diagnose your condition. HOME CARE INSTRUCTIONS Watch your condition for any changes. Follow these instructions as directed to lessen the pain that you are feeling.  Take medicines only as directed by your health care provider.  Rest the affected area for as long as your health care provider says that you should. If directed to do so, raise the painful joint above the level of your heart while you are sitting or lying down.  Do not do things that cause or worsen pain.  If directed, apply ice to the painful area:  Put ice in a plastic bag.  Place a towel between your skin and the bag.  Leave the ice on for 20 minutes, 2-3 times per day.  Wear an elastic bandage, splint, or sling as directed by your health care  provider. Loosen the elastic bandage or splint if your fingers or toes become numb and tingle, or if they turn cold and blue.  Begin exercising or stretching the affected area as directed by your health care provider. Ask your health care provider what types of exercise are safe for you.  Keep all follow-up visits as directed by your health care provider. This is important. SEEK MEDICAL CARE IF:  Your pain increases, and medicine does not help.  Your joint pain does not improve within 3 days.  You have increased bruising or swelling.  You have a fever.  You lose 10 lb (4.5 kg) or more without trying. SEEK IMMEDIATE MEDICAL CARE IF:  You are not able to move the joint.  Your fingers or toes become numb or they turn cold and blue.   This information is not intended to replace advice given to you by your health care provider. Make sure you discuss any questions you have with your health care provider.   Document Released: 02/09/2005 Document Revised: 03/02/2014 Document Reviewed: 11/21/2013 Elsevier Interactive Patient Education [employer].

## 2015-04-09 NOTE — ED Notes (Signed)
Pt here with "pulled" muscle for 1 week. She woke up one morning and felt the pull in her left calf. Denies any injury. Has been taking flexeril without relief. There is no swelling or redness to left calf, but calf is tender to touch. Reports hx of DVT.

## 2015-04-09 NOTE — Progress Notes (Signed)
VASCULAR LAB PRELIMINARY  PRELIMINARY  PRELIMINARY  PRELIMINARY  Left lower extremity venous duplex completed.    Preliminary report:  Left:  No evidence of DVT, superficial thrombosis, or Baker's cyst.  Willard Madrigal, RVT 04/09/2015, 11:18 AM

## 2015-04-14 ENCOUNTER — Emergency Department (HOSPITAL_COMMUNITY)
Admission: EM | Admit: 2015-04-14 | Discharge: 2015-04-15 | Disposition: A | Discharge status: Home / Self Care | Payer: Self-pay | Attending: Emergency Medicine | Admitting: Emergency Medicine

## 2015-04-14 ENCOUNTER — Encounter (HOSPITAL_COMMUNITY): Payer: Self-pay | Admitting: *Deleted

## 2015-04-14 DIAGNOSIS — R05 Cough: Secondary | ICD-10-CM | POA: Insufficient documentation

## 2015-04-14 DIAGNOSIS — Z8673 Personal history of transient ischemic attack (TIA), and cerebral infarction without residual deficits: Secondary | ICD-10-CM | POA: Insufficient documentation

## 2015-04-14 DIAGNOSIS — J449 Chronic obstructive pulmonary disease, unspecified: Secondary | ICD-10-CM | POA: Insufficient documentation

## 2015-04-14 DIAGNOSIS — I252 Old myocardial infarction: Secondary | ICD-10-CM | POA: Insufficient documentation

## 2015-04-14 DIAGNOSIS — R0981 Nasal congestion: Secondary | ICD-10-CM | POA: Insufficient documentation

## 2015-04-14 DIAGNOSIS — I10 Essential (primary) hypertension: Secondary | ICD-10-CM | POA: Insufficient documentation

## 2015-04-14 DIAGNOSIS — E119 Type 2 diabetes mellitus without complications: Secondary | ICD-10-CM | POA: Insufficient documentation

## 2015-04-14 DIAGNOSIS — R109 Unspecified abdominal pain: Secondary | ICD-10-CM | POA: Insufficient documentation

## 2015-04-14 DIAGNOSIS — I25119 Atherosclerotic heart disease of native coronary artery with unspecified angina pectoris: Secondary | ICD-10-CM | POA: Insufficient documentation

## 2015-04-14 DIAGNOSIS — R112 Nausea with vomiting, unspecified: Secondary | ICD-10-CM | POA: Insufficient documentation

## 2015-04-14 LAB — CBC
HCT: 37 % (ref 36.0–46.0)
Hemoglobin: 11.6 g/dL — ABNORMAL LOW (ref 12.0–15.0)
MCH: 26 pg (ref 26.0–34.0)
MCHC: 31.4 g/dL (ref 30.0–36.0)
MCV: 82.8 fL (ref 78.0–100.0)
PLATELETS: 331 10*3/uL (ref 150–400)
RBC: 4.47 MIL/uL (ref 3.87–5.11)
RDW: 14.6 % (ref 11.5–15.5)
WBC: 9.7 10*3/uL (ref 4.0–10.5)

## 2015-04-14 LAB — COMPREHENSIVE METABOLIC PANEL
ALT: 12 U/L — AB (ref 14–54)
AST: 21 U/L (ref 15–41)
Albumin: 3.6 g/dL (ref 3.5–5.0)
Alkaline Phosphatase: 111 U/L (ref 38–126)
Anion gap: 13 (ref 5–15)
BILIRUBIN TOTAL: 0.2 mg/dL — AB (ref 0.3–1.2)
BUN: 18 mg/dL (ref 6–20)
CALCIUM: 9.1 mg/dL (ref 8.9–10.3)
CO2: 21 mmol/L — ABNORMAL LOW (ref 22–32)
CREATININE: 1.29 mg/dL — AB (ref 0.44–1.00)
Chloride: 95 mmol/L — ABNORMAL LOW (ref 101–111)
GFR calc Af Amer: 49 mL/min — ABNORMAL LOW (ref >60)
GFR, EST NON AFRICAN AMERICAN: 42 mL/min — AB (ref >60)
Glucose, Bld: 276 mg/dL — ABNORMAL HIGH (ref 65–99)
Potassium: 4.7 mmol/L (ref 3.5–5.1)
Sodium: 129 mmol/L — ABNORMAL LOW (ref 135–145)
TOTAL PROTEIN: 7.7 g/dL (ref 6.5–8.1)

## 2015-04-14 LAB — LIPASE, BLOOD: Lipase: 19 U/L (ref 11–51)

## 2015-04-14 NOTE — ED Notes (Signed)
Pt. Left AMA  

## 2015-04-14 NOTE — ED Notes (Signed)
Pt reports cough, congestion, N/V and abdominal pain for 4 days. pts states that she took zofran at home with little relief.

## 2015-04-14 NOTE — ED Notes (Signed)
Pt left AMA °

## 2015-04-18 ENCOUNTER — Other Ambulatory Visit: Payer: Self-pay

## 2015-04-20 ENCOUNTER — Other Ambulatory Visit: Payer: Self-pay | Admitting: Endocrinology

## 2015-04-23 ENCOUNTER — Telehealth: Payer: Self-pay | Admitting: Endocrinology

## 2015-04-23 ENCOUNTER — Ambulatory Visit: Payer: Self-pay | Admitting: Endocrinology

## 2015-04-23 NOTE — Telephone Encounter (Signed)
Patient no showed today's appt. Please advise on how to follow up. °A. No follow up necessary. °B. Follow up urgent. Contact patient immediately. °C. Follow up necessary. Contact patient and schedule visit in ___ days. °D. Follow up advised. Contact patient and schedule visit in ____weeks. ° °

## 2015-04-23 NOTE — Telephone Encounter (Signed)
Needs to be rescheduled shortly

## 2015-04-24 ENCOUNTER — Encounter: Payer: Self-pay | Admitting: *Deleted

## 2015-04-24 NOTE — Telephone Encounter (Signed)
Letter mailed

## 2015-05-01 ENCOUNTER — Telehealth: Payer: Self-pay | Admitting: Endocrinology

## 2015-05-01 NOTE — Telephone Encounter (Signed)
Tammy, nurse case manager at [facility] is calling to find out most recent A1C and BP readings CB# [phone] She said you can leave VM

## 2015-05-02 NOTE — Telephone Encounter (Signed)
Information given to nurse.

## 2015-05-20 ENCOUNTER — Other Ambulatory Visit (INDEPENDENT_AMBULATORY_CARE_PROVIDER_SITE_OTHER): Payer: Self-pay

## 2015-05-20 DIAGNOSIS — E785 Hyperlipidemia, unspecified: Secondary | ICD-10-CM

## 2015-05-20 DIAGNOSIS — Z794 Long term (current) use of insulin: Secondary | ICD-10-CM

## 2015-05-20 DIAGNOSIS — E1165 Type 2 diabetes mellitus with hyperglycemia: Secondary | ICD-10-CM

## 2015-05-20 LAB — HEMOGLOBIN A1C: Hgb A1c MFr Bld: 8.4 % — ABNORMAL HIGH (ref 4.6–6.5)

## 2015-05-20 LAB — LIPID PANEL
CHOL/HDL RATIO: 3
CHOLESTEROL: 128 mg/dL (ref 0–200)
HDL: 40.2 mg/dL (ref >39.00)
NonHDL: 87.34
TRIGLYCERIDES: 212 mg/dL — AB (ref 0.0–149.0)
VLDL: 42.4 mg/dL — AB (ref 0.0–40.0)

## 2015-05-20 LAB — COMPREHENSIVE METABOLIC PANEL
ALT: 10 U/L (ref 0–35)
AST: 12 U/L (ref 0–37)
Albumin: 3.8 g/dL (ref 3.5–5.2)
Alkaline Phosphatase: 104 U/L (ref 39–117)
BUN: 22 mg/dL (ref 6–23)
CALCIUM: 9.3 mg/dL (ref 8.4–10.5)
CHLORIDE: 103 meq/L (ref 96–112)
CO2: 25 meq/L (ref 19–32)
Creatinine, Ser: 1.25 mg/dL — ABNORMAL HIGH (ref 0.40–1.20)
GFR: 45.32 mL/min — AB (ref >60.00)
Glucose, Bld: 202 mg/dL — ABNORMAL HIGH (ref 70–99)
POTASSIUM: 4.5 meq/L (ref 3.5–5.1)
Sodium: 137 mEq/L (ref 135–145)
Total Bilirubin: 0.3 mg/dL (ref 0.2–1.2)
Total Protein: 7.6 g/dL (ref 6.0–8.3)

## 2015-05-20 LAB — LDL CHOLESTEROL, DIRECT: Direct LDL: 57 mg/dL

## 2015-05-20 LAB — TSH: TSH: 2.56 u[IU]/mL (ref 0.35–4.50)

## 2015-05-21 ENCOUNTER — Telehealth: Payer: Self-pay | Admitting: Vascular Surgery

## 2015-05-21 DIAGNOSIS — I739 Peripheral vascular disease, unspecified: Secondary | ICD-10-CM

## 2015-05-21 DIAGNOSIS — Z95828 Presence of other vascular implants and grafts: Secondary | ICD-10-CM

## 2015-05-21 NOTE — Telephone Encounter (Signed)
Pt is calling for the second time today, she states she has block artery in her left calf. The pain is bad. Please call pt. This is her feelings of about the "block artery"

## 2015-05-21 NOTE — Telephone Encounter (Signed)
Returned call to pt.  Reported a 2 week hx. of left calf pain with walking; stated it eases with rest.  Also reported bilat feet swelling during the day, and goes down at night.  Denied any rest pain of left LE.  Denied any difficulty with moving the left foot/ toes.  Reported the pain with walking seems to be getting worse.  Stated she pushes herself to keep going, because she doesn't want to lose her left foot.  Stated her left foot/ LE remains warm.  Advised will schedule appt. For eval., and notify her with appt. Info.  Agreed.

## 2015-05-22 NOTE — Telephone Encounter (Signed)
Spoke with pt to schedule, dpm °

## 2015-05-23 ENCOUNTER — Ambulatory Visit: Payer: Self-pay | Admitting: Endocrinology

## 2015-05-23 ENCOUNTER — Encounter: Payer: Self-pay | Admitting: Family

## 2015-05-23 ENCOUNTER — Ambulatory Visit (HOSPITAL_COMMUNITY)
Admission: RE | Admit: 2015-05-23 | Discharge: 2015-05-23 | Disposition: A | Discharge status: Home / Self Care | Payer: Medicare Other | Source: Ambulatory Visit | Attending: Vascular Surgery | Admitting: Vascular Surgery

## 2015-05-23 ENCOUNTER — Ambulatory Visit (INDEPENDENT_AMBULATORY_CARE_PROVIDER_SITE_OTHER)
Admission: RE | Admit: 2015-05-23 | Discharge: 2015-05-23 | Disposition: A | Discharge status: Home / Self Care | Payer: Medicare Other | Source: Ambulatory Visit | Attending: Vascular Surgery | Admitting: Vascular Surgery

## 2015-05-23 ENCOUNTER — Ambulatory Visit (INDEPENDENT_AMBULATORY_CARE_PROVIDER_SITE_OTHER): Payer: Medicare Other | Admitting: Family

## 2015-05-23 VITALS — BP 140/76 | HR 75 | Ht 62.0 in | Wt 171.4 lb

## 2015-05-23 DIAGNOSIS — T82898D Other specified complication of vascular prosthetic devices, implants and grafts, subsequent encounter: Secondary | ICD-10-CM

## 2015-05-23 DIAGNOSIS — T82392D Other mechanical complication of femoral arterial graft (bypass), subsequent encounter: Secondary | ICD-10-CM

## 2015-05-23 DIAGNOSIS — I10 Essential (primary) hypertension: Secondary | ICD-10-CM | POA: Insufficient documentation

## 2015-05-23 DIAGNOSIS — E785 Hyperlipidemia, unspecified: Secondary | ICD-10-CM | POA: Diagnosis not present

## 2015-05-23 DIAGNOSIS — I779 Disorder of arteries and arterioles, unspecified: Secondary | ICD-10-CM | POA: Diagnosis not present

## 2015-05-23 DIAGNOSIS — I739 Peripheral vascular disease, unspecified: Secondary | ICD-10-CM

## 2015-05-23 DIAGNOSIS — E119 Type 2 diabetes mellitus without complications: Secondary | ICD-10-CM | POA: Insufficient documentation

## 2015-05-23 DIAGNOSIS — Z87891 Personal history of nicotine dependence: Secondary | ICD-10-CM

## 2015-05-23 DIAGNOSIS — Z95828 Presence of other vascular implants and grafts: Secondary | ICD-10-CM | POA: Diagnosis not present

## 2015-05-23 DIAGNOSIS — IMO0002 Reserved for concepts with insufficient information to code with codable children: Secondary | ICD-10-CM

## 2015-05-23 DIAGNOSIS — R938 Abnormal findings on diagnostic imaging of other specified body structures: Secondary | ICD-10-CM | POA: Insufficient documentation

## 2015-05-23 DIAGNOSIS — E1165 Type 2 diabetes mellitus with hyperglycemia: Secondary | ICD-10-CM

## 2015-05-23 DIAGNOSIS — K219 Gastro-esophageal reflux disease without esophagitis: Secondary | ICD-10-CM | POA: Insufficient documentation

## 2015-05-23 DIAGNOSIS — E1151 Type 2 diabetes mellitus with diabetic peripheral angiopathy without gangrene: Secondary | ICD-10-CM

## 2015-05-23 MED ORDER — CILOSTAZOL 100 MG PO TABS: 100.0000 mg | ORAL_TABLET | Freq: Two times a day (BID) | ORAL | Status: DC

## 2015-05-23 NOTE — Progress Notes (Signed)
[facility] [place] HISTORY AND PHYSICAL -PAD  History of Present Illness April Branch is a 70 y.o. female patient of Dr. Oneida Alar who is status post left femoropopliteal bypass graft with propaten on 10/12/2011 with subsequent occlusion, and left femoral to below-knee popliteal bypass with propaten on 04/17/2014.  She also has a history of carotid artery stenosis.   She returns today with c/o left calf worsening pain that started a month ago, this followed a month after she sprained her right calf. She denies rest pain, the left calf pain occurs  She denies non healing wounds in her feet. Baseline she has peripheral neuropathy pain in both feet, gabapentin helps slightly with this.  Left hand claudication/pain/weakness sx's have been present for years, but is gradually worsening, she has trouble holding on to objects in her left hand.  She denies non healing wounds. She has severe vertigo/ataxia. States she had brain imaging about 2010 which demonstrated 3 TIA'S, has intermittent dizziness, has tingling and numbness in both hands, weakness in both hands, moreso in left.  Carotid Duplex on file result was from 8/5/20013, showed 60-79% right ICA stenosis and 40-59% left ICA stenosis, requested by Dr. Burt Knack, pt does not recall this.  March, 2015 carotid Duplex demonstrated <40% stenosis in both internal carotid arteries.  She has known lumbar spine issues with radiculopathy in both legs.  She gets choked easily, has to drink through a straw.  She no longer has dry cracked fissures on both heals. She has no claudication symptoms in her left leg. Her right calf hurts after walking about 1/2 mile, relieved with rest.  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  She has intermittent dizziness, has tingling and numbness in both hands, weakness in both  hands, moreso in left.  She had severe headaches several years ago which prompted brain imaging, states was found she had 3 ministrokes, she does not recall any TIA symptoms other than the headache, denies lateralizing symptoms.   She was hospitalized recently at [facility] for dehydration, does not know how she got dehydrated.   Pt states her B12 level has not been checked, she reports worsening ataxia over the last year.   Pt Diabetic: Yes,11.9 A1C on 10/01/14 (review of records); 8.4 on 05/20/15; this is an improvement since she has been seeing Dr. [name]  Pt smoker: former smoker (quit January 2016, started at age 76 yrs)  Pt meds include:  Statin :Yes  ASA: No  Other anticoagulants/antiplatelets: Plavix      Past Medical History  Diagnosis Date  . Hyperlipidemia   . Arthritis   . Hypothyroidism   . GERD (gastroesophageal reflux disease)   . Anxiety   . Diabetes mellitus age 31    insulin dependent  . Peripheral vascular disease ([place])   . Lumbar disc disease   . Insulin dependent type 2 diabetes mellitus, controlled ([place])   . GERD 07/20/2006  . CAD (coronary artery disease)   . CAD (coronary artery disease) of bypass graft   . Vertigo   . History of transient ischemic attack (TIA)   . Myocardial infarction ([place])     AGE 56    . Hypertension   . Depression   . COPD 12/14/2008    PATIENT DENIES    . Personal history of malignant neoplasm of kidney(V10.52) 12/14/2008  Laparoscopic biopsy and cryoablation 7/08 Dr. Vernie Shanks   . Headache(784.0)   . Neuromuscular disorder ([place])     PERIPHERAL NEUROPATHY  . Anemia   . Diabetic coma ([place]) Feb. 2014  . Fall at home Jan. 2, 2016  Jan. 13, 2016    Left knee  . Anginal pain ([place])     1 MONTH AGO   . Stroke [facility])     3 MINI STROKES  RT SIDED WEAKNESS  . Fibromyalgia   . HPV (human papilloma virus) infection   . Colon polyps 09/11/2010    Tubular adenoma  . 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 Smoking status: Former Smoker -- 0.25 packs/day for 50 years    Types: Cigarettes    Quit date: 03/17/2014  . Smokeless tobacco: Never Used  . Alcohol Use: No    Family History Family History  Problem Relation Age of Onset  . Heart disease Mother   . Lung cancer Mother   . Cancer Mother     Lung  . Heart disease Father   . Lung cancer Father   . Cancer Father     Lung  . Heart disease Sister     CABG- Open Heart    Past Surgical History  Procedure Laterality Date  . Appendectomy    . Abdominal hysterectomy    . Knee surgery Bilateral     "cartilage"  . Cholecystectomy    . Lung surgery      lung nodule removed from the right side  . Laparascopic cryoablation of left kidney  08/2006    Dr. Gladis Riffle for renal cell cancer  . Bladder suspension    . Shoulder surgery Bilateral     'Spurs"  . Foot surgery    . Pr vein bypass graft,aorto-fem-pop  05/27/2010  . Cardiac catheterization    . Femoral-popliteal bypass graft  10/12/2011    Procedure: BYPASS GRAFT FEMORAL-POPLITEAL ARTERY;  Surgeon: Elam Dutch, MD;  Location: [facility] OR;  Service: Vascular;  Laterality: Left;  Left Femoral-Popliteal Bypass Graft using 66mm x 80cm Propaten Graft with intraop arteriogram times one.  . Intraoperative arteriogram  10/12/2011    Procedure: INTRA OPERATIVE ARTERIOGRAM;  Surgeon: Elam Dutch, MD;  Location: [place];  Service: Vascular;  Laterality: Left;  . Left heart catheterization with coronary angiogram N/A 05/11/2011    Procedure: LEFT HEART CATHETERIZATION WITH CORONARY ANGIOGRAM;  Surgeon: Jolaine Artist, MD;  Location: [facility] CATH LAB;  Service: Cardiovascular;  Laterality: N/A;  . Abdominal aortagram N/A 08/21/2011    Procedure: ABDOMINAL Maxcine Ham;  Surgeon: Elam Dutch, MD;  Location: [facility] CATH LAB;  Service: Cardiovascular;  Laterality: N/A;  . Abdominal aortagram N/A 03/16/2014    Procedure: ABDOMINAL  Maxcine Ham;  Surgeon: Elam Dutch, MD;  Location: [facility] CATH LAB;  Service: Cardiovascular;  Laterality: N/A;  . Femoral-popliteal bypass graft Left 04/17/2014    Procedure: REDO LEFT FEMORAL-POPLITEAL ARTERY BYPASS USING GORE PROPATEN 54mmx80cm GRAFT;  Surgeon: Elam Dutch, MD;  Location: [place];  Service: Vascular;  Laterality: Left;  . Esophagogastroduodenoscopy (egd) with propofol N/A 05/16/2014    Procedure: ESOPHAGOGASTRODUODENOSCOPY (EGD) WITH PROPOFOL with Balloon dilation;  Surgeon: Milus Banister, MD;  Location: [place];  Service: Endoscopy;  Laterality: N/A;    No Known Allergies  Current Outpatient Prescriptions  Medication Sig Dispense Refill  . ACCU-CHEK  AVIVA PLUS test strip     . ACCU-CHEK FASTCLIX LANCETS MISC Use 4 per day to check blood sugar dx code E11.9 102 each 3  . Alcohol Swabs (B-D SINGLE USE SWABS REGULAR) PADS     . aspirin 81 MG tablet Take 81 mg by mouth daily.    . clopidogrel (PLAVIX) 75 MG tablet Take 75 mg by mouth daily.     . DULoxetine (CYMBALTA) 30 MG capsule Take 60 mg by mouth at bedtime. Take two tablets at night 30 mg    . DULoxetine (CYMBALTA) 60 MG capsule TAKE ONE CAPSULE BY MOUTH EVERY DAY 30 capsule 3  . ferrous sulfate 325 (65 FE) MG tablet Take 325 mg by mouth 2 (two) times daily with a meal.    . gabapentin (NEURONTIN) 100 MG capsule     . hydrochlorothiazide (HYDRODIURIL) 25 MG tablet Take 25 mg by mouth See admin instructions. Take 1 tablet by mouth every 3 days per patient    . HYDROcodone-acetaminophen (NORCO) 10-325 MG per tablet Take 1 tablet by mouth every 4 (four) hours as needed (pain). 30 tablet 0  . Insulin Glargine (TOUJEO SOLOSTAR) 300 UNIT/ML SOPN Inject 60 Units into the skin 2 (two) times daily. (Patient taking differently: Inject 60 Units into the skin daily. ) 24 mL 3  . Insulin Pen Needle (BD PEN NEEDLE NANO U/F) 32G X 4 MM MISC Use to inject insulin 100 each 3  . KLOR-CON 10 10 MEQ tablet TAKE 1 TABLET BY MOUTH  EVERY DAY 30 tablet 3  . lansoprazole (PREVACID) 30 MG capsule Take 1 capsule (30 mg total) by mouth 2 (two) times daily before a meal. 60 capsule 11  . levothyroxine (SYNTHROID, LEVOTHROID) 137 MCG tablet TAKE 1 TABLET (137 MCG TOTAL) BY MOUTH DAILY BEFORE BREAKFAST. 30 tablet 5  . lisinopril (PRINIVIL,ZESTRIL) 5 MG tablet TAKE 1 TABLET (5 MG TOTAL) BY MOUTH DAILY. 30 tablet 3  . LORazepam (ATIVAN) 1 MG tablet Take 3 mg by mouth at bedtime.    . metFORMIN (GLUCOPHAGE XR) 750 MG 24 hr tablet take 2 tablets daily 180 tablet 1  . nortriptyline (PAMELOR) 75 MG capsule Take 75 mg by mouth at bedtime.    Marland Kitchen NOVOLOG FLEXPEN 100 UNIT/ML FlexPen INJECT 10-15 UNITS INTO THE SKIN 3 TIMES A DAY WITH MEALS  3  . ondansetron (ZOFRAN) 8 MG tablet Take 1 tablet (8 mg total) by mouth every 8 (eight) hours as needed for nausea or vomiting. 20 tablet 0  . oxyCODONE-acetaminophen (PERCOCET) 5-325 MG tablet Take 1-2 tablets by mouth every 6 (six) hours as needed. 10 tablet 0  . pravastatin (PRAVACHOL) 20 MG tablet Take 20 mg by mouth daily.    Marland Kitchen tolterodine (DETROL LA) 4 MG 24 hr capsule Take 4 mg by mouth daily.      No current facility-administered medications for this visit.    ROS: See HPI for pertinent positives and negatives.   Physical Examination  Filed Vitals:   05/23/15 1420  BP: 140/76  Pulse: 75  Height: 5\' 2"  (1.575 m)  Weight: 171 lb 6.4 oz (77.747 kg)  SpO2: 98%   Body mass index is 31.34 kg/(m^2).  General: A&O x 3, WDWN, obese female.  Gait: mildly ataxic Eyes: PERRLA.  Pulmonary: CTAB, without rales, wheezes ,or rhonchi. No dyspnea. Cardiac: regular rhythm, no detected murmur.   Carotid Bruits  Left  Right    Negative  positive   Aorta is not palpable.  Radial pulses:  right is 2+ palpable, left is 1+ palpable, left brachial pulse is 2+ palpable   VASCULAR EXAM:  Extremities without ischemic changes  without Gangrene; without open wounds.    LE Pulses  LEFT  RIGHT   FEMORAL  2+ palpable  1+ palpable   POPLITEAL  not palpable  not palpable   POSTERIOR TIBIAL  not palpable  not palpable   DORSALIS PEDIS  ANTERIOR TIBIAL  Not palpable  not palpable    Abdomen: soft, NT, no palpable masses.  Skin: no rashes, no ulcers, see Extremities.  Musculoskeletal: no muscle wasting or atrophy.  Neurologic: A&O X 3; Appropriate Affect; MOTOR FUNCTION: moving all extremities equally, motor strength 5/5 throughout. Speech is slightly slurred. CN 2-12 intact except for uvula deviation to the right.                       Non-Invasive Vascular Imaging: DATE: 05/23/2015 LOWER EXTREMITY ARTERIAL DUPLEX EVALUATION    INDICATION: Peripheral Vascular Disease    PREVIOUS INTERVENTION(S): Left femoral-popliteal artery bypass graft with subsequent occlusion; Left femoral-popliteal artery bypass graft redo below knee 04/17/2014.    DUPLEX EXAM:     RIGHT  LEFT   Peak Systolic Velocity (cm/s) Ratio (if abnormal) Waveform  Peak Systolic Velocity (cm/s) Ratio (if abnormal) Waveform     Inflow Artery 127  B     Proximal Anastomosis        Proximal Graft        Mid Graft         Distal Graft        Distal Anastomosis        Outflow Artery 43  m  0.61 Today's ABI / TBI 0.51  0.66/0.63 Previous ABI / TBI (11/22/2014 ) 1.0/1.1    Waveform:    M - Monophasic       B - Biphasic       T - Triphasic  If Ankle Brachial Index (ABI) or Toe Brachial Index (TBI) performed, please see complete report     ADDITIONAL FINDINGS: The right lower extremity was not imaged during this exam.    IMPRESSION: Unable to document flow in the left-fem-popliteal bypass graft. Monophasic flow noted in the native popliteal and posterior tibia artery. No flow was documented in the anterior tibia artery. The left peroneal demonstrated blunted waveforms with decreased velocities suggestive of  occlusive disease distally.  Comparison to previous exam on page 2.    Compared to the previous exam:  Decreased left ABI compared to previous exam of 11/22/2014.     ASSESSMENT: April Branch is a 70 y.o. female who is status post left femoropopliteal bypass graft with propaten on 10/12/2011 with subsequent occlusion, left femoral to below-knee popliteal bypass with propaten on 04/17/2014.  She also has a history of carotid artery stenosis.  She does not give a history of known TIA events, but pt reports that brain imaging from about 2010 indicates a history of 3 TIA's. She has intermittent vertigo or ataxia, not clear if this is TIA related.  September 2016 carotid duplex suggests minimal bilateral ICA stenoses, no significant change from 05/19/13.   She developed left calf claudication a month ago, had no claudication since the above left LE redo vascularization up until a month ago. The left calf claudication starts after walking about 100-200 feet, relieved with rest. She has no signs of ischemia in her feet/legs.  Today's left LE arterial duplex was unable to document flow  in the left-fem-popliteal bypass graft. Monophasic flow noted in the native popliteal and posterior tibia artery. No flow was documented in the anterior tibia artery. The left peroneal demonstrated blunted waveforms with decreased velocities suggestive of occlusive disease distally. Decreased left ABI compared to previous exam of 11/22/2014, from 100% to 51%.  Right ABI remains stable with moderate arterial occlusive disease.   Pt denies hx of CHF, will start Pletal 100 mg daily for 1-2 weeks, if she has no diarrhea and this is adequate to help with left calf claudication, will maintain this dose; she may increase to 100 mg twice daily if once daily does not adequately help claudication.   Face to face time with patient was 25 minutes. Over 50% of this time was spent on counseling and coordination of care.     PLAN:  Graduated walking program reiterated and explained in detail.   Based on the patient's vascular studies and examination, and after discussing with Dr. Oneida Alar, pt will return to clinic in 6 months with ABI's and see Dr. Oneida Alar. I advised pt to notify us if she develops pain in her leg(s) at rest or non healing wounds.    I discussed in depth with the patient the nature of atherosclerosis, and emphasized the importance of maximal medical management including strict control of blood pressure, blood glucose, and lipid levels, obtaining regular exercise, and continued cessation of smoking.  The patient is aware that without maximal medical management the underlying atherosclerotic disease process will progress, limiting the benefit of any interventions.  The patient was given information about PAD including signs, symptoms, treatment, what symptoms should prompt the patient to seek immediate medical care, and risk reduction measures to take.  Clemon Chambers, RN, MSN, FNP-C [facility] Phone: [phone]  Clinic MD: Oneida Alar  05/23/2015 2:46 PM

## 2015-05-23 NOTE — Patient Instructions (Signed)
Peripheral Vascular Disease Peripheral vascular disease (PVD) is a disease of the blood vessels that are not part of your heart and brain. A simple term for PVD is poor circulation. In most cases, PVD narrows the blood vessels that carry blood from your heart to the rest of your body. This can result in a decreased supply of blood to your arms, legs, and internal organs, like your stomach or kidneys. However, it most often affects a person's lower legs and feet. There are two types of PVD.  Organic PVD. This is the more common type. It is caused by damage to the structure of blood vessels.  Functional PVD. This is caused by conditions that make blood vessels contract and tighten (spasm). Without treatment, PVD tends to get worse over time. PVD can also lead to acute ischemic limb. This is when an arm or limb suddenly has trouble getting enough blood. This is a medical emergency. CAUSES Each type of PVD has many different causes. The most common cause of PVD is buildup of a fatty material (plaque) inside of your arteries (atherosclerosis). Small amounts of plaque can break off from the walls of the blood vessels and become lodged in a smaller artery. This blocks blood flow and can cause acute ischemic limb. Other common causes of PVD include:  Blood clots that form inside of blood vessels.  Injuries to blood vessels.  Diseases that cause inflammation of blood vessels or cause blood vessel spasms.  Health behaviors and health history that increase your risk of developing PVD. RISK FACTORS  You may have a greater risk of PVD if you:  Have a family history of PVD.  Have certain medical conditions, including:  High cholesterol.  Diabetes.  High blood pressure (hypertension).  Coronary heart disease.  Past problems with blood clots.  Past injury, such as burns or a broken bone. These may have damaged blood vessels in your limbs.  Buerger disease. This is caused by inflamed blood  vessels in your hands and feet.  Some forms of arthritis.  Rare birth defects that affect the arteries in your legs.  Use tobacco.  Do not get enough exercise.  Are obese.  Are age 50 or older. SIGNS AND SYMPTOMS  PVD may cause many different symptoms. Your symptoms depend on what part of your body is not getting enough blood. Some common signs and symptoms include:  Cramps in your lower legs. This may be a symptom of poor leg circulation (claudication).  Pain and weakness in your legs while you are physically active that goes away when you rest (intermittent claudication).  Leg pain when at rest.  Leg numbness, tingling, or weakness.  Coldness in a leg or foot, especially when compared with the other leg.  Skin or hair changes. These can include:  Hair loss.  Shiny skin.  Pale or bluish skin.  Thick toenails.  Inability to get or maintain an erection (erectile dysfunction). People with PVD are more prone to developing ulcers and sores on their toes, feet, or legs. These may take longer than normal to heal. DIAGNOSIS Your health care provider may diagnose PVD from your signs and symptoms. The health care provider will also do a physical exam. You may have tests to find out what is causing your PVD and determine its severity. Tests may include:  Blood pressure recordings from your arms and legs and measurements of the strength of your pulses (pulse volume recordings).  Imaging studies using sound waves to take pictures of   the blood flow through your blood vessels (Doppler ultrasound).  Injecting a dye into your blood vessels before having imaging studies using:  X-rays (angiogram or arteriogram).  Computer-generated X-rays (CT angiogram).  A powerful electromagnetic field and a computer (magnetic resonance angiogram or MRA). TREATMENT Treatment for PVD depends on the cause of your condition and the severity of your symptoms. It also depends on your age. Underlying  causes need to be treated and controlled. These include long-lasting (chronic) conditions, such as diabetes, high cholesterol, and high blood pressure. You may need to first try making lifestyle changes and taking medicines. Surgery may be needed if these do not work. Lifestyle changes may include:  Quitting smoking.  Exercising regularly.  Following a low-fat, low-cholesterol diet. Medicines may include:  Blood thinners to prevent blood clots.  Medicines to improve blood flow.  Medicines to improve your blood cholesterol levels. Surgical procedures may include:  A procedure that uses an inflated balloon to open a blocked artery and improve blood flow (angioplasty).  A procedure to put in a tube (stent) to keep a blocked artery open (stent implant).  Surgery to reroute blood flow around a blocked artery (peripheral bypass surgery).  Surgery to remove dead tissue from an infected wound on the affected limb.  Amputation. This is surgical removal of the affected limb. This may be necessary in cases of acute ischemic limb that are not improved through medical or surgical treatments. HOME CARE INSTRUCTIONS  Take medicines only as directed by your health care provider.  Do not use any tobacco products, including cigarettes, chewing tobacco, or electronic cigarettes. If you need help quitting, ask your health care provider.  Lose weight if you are overweight, and maintain a healthy weight as directed by your health care provider.  Eat a diet that is low in fat and cholesterol. If you need help, ask your health care provider.  Exercise regularly. Ask your health care provider to suggest some good activities for you.  Use compression stockings or other mechanical devices as directed by your health care provider.  Take good care of your feet.  Wear comfortable shoes that fit well.  Check your feet often for any cuts or sores. SEEK MEDICAL CARE IF:  You have cramps in your legs  while walking.  You have leg pain when you are at rest.  You have coldness in a leg or foot.  Your skin changes.  You have erectile dysfunction.  You have cuts or sores on your feet that are not healing. SEEK IMMEDIATE MEDICAL CARE IF:  Your arm or leg turns cold and blue.  Your arms or legs become red, warm, swollen, painful, or numb.  You have chest pain or trouble breathing.  You suddenly have weakness in your face, arm, or leg.  You become very confused or lose the ability to speak.  You suddenly have a very bad headache or lose your vision.   This information is not intended to replace advice given to you by your health care provider. Make sure you discuss any questions you have with your health care provider.   Document Released: 03/19/2004 Document Revised: 03/02/2014 Document Reviewed: 07/20/2013 Elsevier Interactive Patient Education 2016 [employer]  

## 2015-05-24 ENCOUNTER — Encounter: Payer: Self-pay | Admitting: Endocrinology

## 2015-05-24 ENCOUNTER — Ambulatory Visit (INDEPENDENT_AMBULATORY_CARE_PROVIDER_SITE_OTHER): Payer: Medicare Other | Admitting: Endocrinology

## 2015-05-24 VITALS — BP 116/60 | HR 84 | Temp 98.9°F | Resp 16 | Ht 62.0 in | Wt 172.6 lb

## 2015-05-24 DIAGNOSIS — E1165 Type 2 diabetes mellitus with hyperglycemia: Secondary | ICD-10-CM | POA: Diagnosis not present

## 2015-05-24 DIAGNOSIS — Z794 Long term (current) use of insulin: Secondary | ICD-10-CM

## 2015-05-24 MED ORDER — CANAGLIFLOZIN 100 MG PO TABS: ORAL_TABLET | ORAL | Status: DC

## 2015-05-24 NOTE — Patient Instructions (Signed)
Increase Toujeo to 70  Novolog 20 units with each meal, reduce to 15 for salads or small meals  Invokana in ams  Call if sugars getting low

## 2015-05-24 NOTE — Progress Notes (Signed)
Patient ID: April Branch, female   DOB: [birth year], 70 y.o.   MRN: [id number]           Reason for Appointment: Follow-up  for Type 2 Diabetes  Referring physician: Pattricia Boss  History of Present Illness:          Date of diagnosis of type 2 diabetes mellitus: 1980s       Previous history:    She thinks she was treated with metformin for some time before she went on insulin in ?  2002   She has mostly been treated with Lantus and rapid acting insulin such as Apidra or Novolog.   She thinks that Apidra worked fairly fast but insurance did not cover this, generally has had poor control   Recent history:   INSULIN regimen is described as:   Toujeo 60 units in am, 15 Novolog before meals  On her initial consultation she was switched from Lantus to Toujeo twice a day because of blood sugars mostly over 300 She is taking the Toujeo only once a day and compared to her Lantus dose of 80 units she is taking less insulin Was also started on metformin ER 750 mg, 2 tablets daily  Her A1c on the last visit was 7.4 and is now 8.4 She thinks her blood sugars are higher because of stress and anxiety  Current blood sugar patterns and problems identified:  FASTING blood sugars are mostly high but not consistent and overall the lowest of the day  Even though she is not eating much at breakfast except in oatmeal cookie her blood sugars are significantly higher mid-day  Blood sugars are mostly high the rest of the day with significant variability  Does have some good readings after supper based on her meal intake which may be less in the evening  She is taking metformin twice a day now  She thinks her diet is irregular because of anxiety and stress and may eat more in between  Oral hypoglycemic drugs the patient is taking are:  Metformin ER 750 mg 1 bid     Side effects from medications have been: none  Compliance with the medical regimen: Inconsistent  Hypoglycemia:   none  recently  Glucose monitoring:  done  2-4  times a day         Glucometer: Accucheck      Blood Glucose readings by monitor download:  Mean values apply above for all meters except median for One Touch  PRE-MEAL Fasting Lunch Dinner Bedtime Overall  Glucose range: 91-219  188-276  178-277  126-302    Mean/median: 151  221  206  190  199    Self-care: The diet that the patient has been following is: tries to limit  High-fat foods.     Typical meal intake: Breakfast is variable at 9 AM.  Usually has sandwich for lunch               Dietician visit, most recent: never               Exercise:  some slow walking , not able to walk much because of leg pain   Weight history:  Highest 199 , has not lost any weight recently  Wt Readings from Last 3 Encounters:  05/24/15 172 lb 9.6 oz (78.291 kg)  05/23/15 171 lb 6.4 oz (77.747 kg)  02/21/15 178 lb 6.4 oz (80.922 kg)    Glycemic control:   Lab Results  Component Value Date  HGBA1C 8.4* 05/20/2015   HGBA1C 7.4* 01/07/2015   HGBA1C 11.9 10/01/2014   Lab Results  Component Value Date   MICROALBUR 1.0 10/25/2014   LDLCALC 76 02/23/2008   CREATININE 1.25* 05/20/2015         Medication List       This list is accurate as of: 05/24/15 11:59 PM.  Always use your most recent med list.               ACCU-CHEK AVIVA PLUS test strip  Generic drug:  glucose blood     ACCU-CHEK FASTCLIX LANCETS Misc  Use 4 per day to check blood sugar dx code E11.9     B-D SINGLE USE SWABS REGULAR Pads     canagliflozin 100 MG Tabs tablet  Commonly known as:  INVOKANA  1 tablet before breakfast     cilostazol 100 MG tablet  Commonly known as:  PLETAL  Take 1 tablet (100 mg total) by mouth 2 (two) times daily before a meal.     clopidogrel 75 MG tablet  Commonly known as:  PLAVIX  Take 75 mg by mouth daily.     DULoxetine 60 MG capsule  Commonly known as:  CYMBALTA  TAKE ONE CAPSULE BY MOUTH EVERY DAY     ferrous sulfate 325 (65 FE)  MG tablet  Take 325 mg by mouth 2 (two) times daily with a meal.     gabapentin 100 MG capsule  Commonly known as:  NEURONTIN     HYDROcodone-acetaminophen 10-325 MG tablet  Commonly known as:  NORCO  Take 1 tablet by mouth every 4 (four) hours as needed (pain).     Insulin Glargine 300 UNIT/ML Sopn  Commonly known as:  TOUJEO SOLOSTAR  Inject 60 Units into the skin 2 (two) times daily.     Insulin Pen Needle 32G X 4 MM Misc  Commonly known as:  BD PEN NEEDLE NANO U/F  Use to inject insulin     KLOR-CON 10 10 MEQ tablet  Generic drug:  potassium chloride  TAKE 1 TABLET BY MOUTH EVERY DAY     lansoprazole 30 MG capsule  Commonly known as:  PREVACID  Take 1 capsule (30 mg total) by mouth 2 (two) times daily before a meal.     levothyroxine 137 MCG tablet  Commonly known as:  SYNTHROID, LEVOTHROID  TAKE 1 TABLET (137 MCG TOTAL) BY MOUTH DAILY BEFORE BREAKFAST.     lisinopril 5 MG tablet  Commonly known as:  PRINIVIL,ZESTRIL  TAKE 1 TABLET (5 MG TOTAL) BY MOUTH DAILY.     LORazepam 1 MG tablet  Commonly known as:  ATIVAN  Take 3 mg by mouth at bedtime.     metFORMIN 750 MG 24 hr tablet  Commonly known as:  GLUCOPHAGE XR  take 2 tablets daily     nortriptyline 75 MG capsule  Commonly known as:  PAMELOR  Take 75 mg by mouth at bedtime.     NOVOLOG FLEXPEN 100 UNIT/ML FlexPen  Generic drug:  insulin aspart  INJECT 10-15 UNITS INTO THE SKIN 3 TIMES A DAY WITH MEALS     ondansetron 8 MG tablet  Commonly known as:  ZOFRAN  Take 1 tablet (8 mg total) by mouth every 8 (eight) hours as needed for nausea or vomiting.     oxyCODONE-acetaminophen 5-325 MG tablet  Commonly known as:  PERCOCET  Take 1-2 tablets by mouth every 6 (six) hours as needed.     pravastatin 20  MG tablet  Commonly known as:  PRAVACHOL  Take 20 mg by mouth daily.     tolterodine 4 MG 24 hr capsule  Commonly known as:  DETROL LA  Take 4 mg by mouth daily.        Allergies: No Known  Allergies  Past Medical History  Diagnosis Date  . Hyperlipidemia   . Arthritis   . Hypothyroidism   . GERD (gastroesophageal reflux disease)   . Anxiety   . Diabetes mellitus age 86    insulin dependent  . Peripheral vascular disease ([place])   . Lumbar disc disease   . Insulin dependent type 2 diabetes mellitus, controlled ([place])   . GERD 07/20/2006  . CAD (coronary artery disease)   . CAD (coronary artery disease) of bypass graft   . Vertigo   . History of transient ischemic attack (TIA)   . Myocardial infarction ([place])     AGE 20    . Hypertension   . Depression   . COPD 12/14/2008    PATIENT DENIES    . Personal history of malignant neoplasm of kidney(V10.52) 12/14/2008    Laparoscopic biopsy and cryoablation 7/08 Dr. Vernie Shanks   . Headache(784.0)   . Neuromuscular disorder ([place])     PERIPHERAL NEUROPATHY  . Anemia   . Diabetic coma ([place]) Feb. 2014  . Fall at home Jan. 2, 2016  Jan. 13, 2016    Left knee  . Anginal pain ([place])     1 MONTH AGO   . Stroke [facility])     3 MINI STROKES  RT SIDED WEAKNESS  . Fibromyalgia   . HPV (human papilloma virus) infection   . Colon polyps 09/11/2010    Tubular adenoma  . 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 Appendectomy    . Abdominal hysterectomy    . Knee surgery Bilateral     "cartilage"  . Cholecystectomy    . Lung surgery      lung nodule removed from the right side  . Laparascopic cryoablation of left kidney  08/2006    Dr. Gladis Riffle for renal cell cancer  . Bladder suspension    . Shoulder surgery Bilateral     'Spurs"  . Foot surgery    . Pr vein bypass graft,aorto-fem-pop  05/27/2010  . Cardiac catheterization    . Femoral-popliteal bypass graft  10/12/2011    Procedure: BYPASS GRAFT FEMORAL-POPLITEAL ARTERY;  Surgeon: Elam Dutch, MD;  [facility];  Service: Vascular;  Laterality: Left;  Left Femoral-Popliteal Bypass  Graft using 62mm x 80cm Propaten Graft with intraop arteriogram times one.  . Intraoperative arteriogram  10/12/2011    Procedure: INTRA OPERATIVE ARTERIOGRAM;  Surgeon: Elam Dutch, MD;  Location: [place];  Service: Vascular;  Laterality: Left;  . Left heart catheterization with coronary angiogram N/A 05/11/2011    Procedure: LEFT HEART CATHETERIZATION WITH CORONARY ANGIOGRAM;  Surgeon: Jolaine Artist, MD;  Location[facility] CATH LAB;  Service: Cardiovascular;  Laterality: N/A;  . Abdominal aortagram N/A 08/21/2011    Procedure: ABDOMINAL Maxcine Ham;  Surgeon: Elam Dutch, MD;  Location: [facility] CATH LAB;  Service: Cardiovascular;  Laterality: N/A;  . Abdominal aortagram N/A 03/16/2014    Procedure: ABDOMINAL Maxcine Ham;  Surgeon: Elam Dutch, MD;  Location: [facility] CATH LAB;  Service: Cardiovascular;  Laterality: N/A;  . Femoral-popliteal bypass graft Left 04/17/2014  Procedure: REDO LEFT FEMORAL-POPLITEAL ARTERY BYPASS USING GORE PROPATEN 61mmx80cm GRAFT;  Surgeon: Elam Dutch, MD;  Location: [place];  Service: Vascular;  Laterality: Left;  . Esophagogastroduodenoscopy (egd) with propofol N/A 05/16/2014    Procedure: ESOPHAGOGASTRODUODENOSCOPY (EGD) WITH PROPOFOL with Balloon dilation;  Surgeon: Milus Banister, MD;  Location: [place];  Service: Endoscopy;  Laterality: N/A;    Family History  Problem Relation Age of Onset  . Heart disease Mother   . Lung cancer Mother   . Cancer Mother     Lung  . Heart disease Father   . Lung cancer Father   . Cancer Father     Lung  . Heart disease Sister     CABG- Open Heart    Social History:  reports that she quit smoking about 14 months ago. Her smoking use included Cigarettes. She has a 12.5 pack-year smoking history. She has never used smokeless tobacco. She reports that she does not drink alcohol or use illicit drugs.    Review of Systems    Lipid history:  She has good control with taking pravastatin but has relatively high  triglycerides    Lab Results  Component Value Date   CHOL 128 05/20/2015   HDL 40.20 05/20/2015   LDLCALC 76 02/23/2008   LDLDIRECT 57.0 05/20/2015   TRIG 212.0* 05/20/2015   CHOLHDL 3 05/20/2015            Hypertension: she is taking lisinopril 5 mg daily , Not taking any HCTZ  Neurological:    Has no numbness, burning but does have pains , burning and some tingling in feet.   Symptoms better with increasing Cymbalta to 60 mg She is being treated with nortriptyline by PCP also  She has had some peripheral vascular disease and vascular surgeon gave her Pletal  Long history of thyroid disease, Has had post ablative hypothyroidism, her dose was increased to 137 previously   Lab Results  Component Value Date   TSH 2.56 05/20/2015    Last  Foot exam: Monofilament sensation is markedly reduced across most of the toes and distal plantar surfaces  Physical Examination:  BP 116/60 mmHg  Pulse 84  Temp(Src) 98.9 F (37.2 C)  Resp 16  Ht 5\' 2"  (1.575 m)  Wt 172 lb 9.6 oz (78.291 kg)  BMI 31.56 kg/m2  SpO2 96%  No ankle edema  ASSESSMENT:  Diabetes type 2, uncontrolled   See history of present illness for detailed discussion of his current management, blood sugar patterns and problems identified  Her blood sugars are erratic again and generally higher than before A1c has gone up to 8.4 This is despite using metformin 1500 mg She appears to be more insulin resistant and also probably has high readings from anxiety and inconsistent diet recently and mostly high  Even with smaller meal in the morning her blood sugars are relatively high midday Does appear to be needing more mealtime insulin also Currently not exercising or losing weight  Since her renal function is normal she is a good candidate for adding the drug like Invokana   PLAN:   Increase the Toujeo to 70  Increase mealtime coverage to 20 units unless she is eating smaller meals like salads Discussed  action of SGLT 2 drugs on lowering glucose by decreasing kidney absorption of glucose, benefits of weight loss and lower blood pressure, possible side effects including candidiasis and dosage regimen  More consistent diet with avoiding high carbohydrate foods this week in  the mornings Invokana 100 mg daily Call if blood sugars are not controlled Follow-up with PCP for anxiety and depression She will be back in 4 weeks for short-term follow-up and needs repeat renal function studies   Patient Instructions  Increase Toujeo to 70  Novolog 20 units with each meal, reduce to 15 for salads or small meals  Invokana in ams  Call if sugars getting low    Counseling time on subjects discussed above is over 50% of today's 25 minute visit    April Branch 05/26/2015, 2:30 PM   Note: This office note was prepared with Dragon voice recognition system technology. Any transcriptional errors that result from this process are unintentional.

## 2015-06-14 ENCOUNTER — Encounter (HOSPITAL_COMMUNITY): Payer: Self-pay

## 2015-06-14 ENCOUNTER — Emergency Department (HOSPITAL_COMMUNITY): Payer: Medicare Other

## 2015-06-14 ENCOUNTER — Emergency Department (HOSPITAL_COMMUNITY)
Admission: EM | Admit: 2015-06-14 | Discharge: 2015-06-14 | Disposition: A | Discharge status: Home / Self Care | Payer: Medicare Other | Attending: Emergency Medicine | Admitting: Emergency Medicine

## 2015-06-14 DIAGNOSIS — Y998 Other external cause status: Secondary | ICD-10-CM | POA: Insufficient documentation

## 2015-06-14 DIAGNOSIS — J449 Chronic obstructive pulmonary disease, unspecified: Secondary | ICD-10-CM | POA: Diagnosis not present

## 2015-06-14 DIAGNOSIS — I252 Old myocardial infarction: Secondary | ICD-10-CM | POA: Diagnosis not present

## 2015-06-14 DIAGNOSIS — Z79899 Other long term (current) drug therapy: Secondary | ICD-10-CM | POA: Diagnosis not present

## 2015-06-14 DIAGNOSIS — F419 Anxiety disorder, unspecified: Secondary | ICD-10-CM | POA: Diagnosis not present

## 2015-06-14 DIAGNOSIS — Y9289 Other specified places as the place of occurrence of the external cause: Secondary | ICD-10-CM | POA: Diagnosis not present

## 2015-06-14 DIAGNOSIS — K219 Gastro-esophageal reflux disease without esophagitis: Secondary | ICD-10-CM | POA: Diagnosis not present

## 2015-06-14 DIAGNOSIS — S81832A Puncture wound without foreign body, left lower leg, initial encounter: Secondary | ICD-10-CM | POA: Diagnosis not present

## 2015-06-14 DIAGNOSIS — F329 Major depressive disorder, single episode, unspecified: Secondary | ICD-10-CM | POA: Diagnosis not present

## 2015-06-14 DIAGNOSIS — Z7984 Long term (current) use of oral hypoglycemic drugs: Secondary | ICD-10-CM | POA: Diagnosis not present

## 2015-06-14 DIAGNOSIS — Z7902 Long term (current) use of antithrombotics/antiplatelets: Secondary | ICD-10-CM | POA: Insufficient documentation

## 2015-06-14 DIAGNOSIS — Z9889 Other specified postprocedural states: Secondary | ICD-10-CM | POA: Diagnosis not present

## 2015-06-14 DIAGNOSIS — Y9389 Activity, other specified: Secondary | ICD-10-CM | POA: Diagnosis not present

## 2015-06-14 DIAGNOSIS — E039 Hypothyroidism, unspecified: Secondary | ICD-10-CM | POA: Diagnosis not present

## 2015-06-14 DIAGNOSIS — I10 Essential (primary) hypertension: Secondary | ICD-10-CM | POA: Diagnosis not present

## 2015-06-14 DIAGNOSIS — Z794 Long term (current) use of insulin: Secondary | ICD-10-CM | POA: Diagnosis not present

## 2015-06-14 DIAGNOSIS — Z8673 Personal history of transient ischemic attack (TIA), and cerebral infarction without residual deficits: Secondary | ICD-10-CM | POA: Diagnosis not present

## 2015-06-14 DIAGNOSIS — S91032A Puncture wound without foreign body, left ankle, initial encounter: Secondary | ICD-10-CM | POA: Diagnosis not present

## 2015-06-14 DIAGNOSIS — M1990 Unspecified osteoarthritis, unspecified site: Secondary | ICD-10-CM | POA: Insufficient documentation

## 2015-06-14 DIAGNOSIS — S81852A Open bite, left lower leg, initial encounter: Secondary | ICD-10-CM

## 2015-06-14 DIAGNOSIS — W540XXA Bitten by dog, initial encounter: Secondary | ICD-10-CM | POA: Insufficient documentation

## 2015-06-14 DIAGNOSIS — Z87891 Personal history of nicotine dependence: Secondary | ICD-10-CM | POA: Diagnosis not present

## 2015-06-14 DIAGNOSIS — Z23 Encounter for immunization: Secondary | ICD-10-CM | POA: Insufficient documentation

## 2015-06-14 DIAGNOSIS — I739 Peripheral vascular disease, unspecified: Secondary | ICD-10-CM | POA: Diagnosis not present

## 2015-06-14 DIAGNOSIS — I25119 Atherosclerotic heart disease of native coronary artery with unspecified angina pectoris: Secondary | ICD-10-CM | POA: Diagnosis not present

## 2015-06-14 DIAGNOSIS — D649 Anemia, unspecified: Secondary | ICD-10-CM | POA: Diagnosis not present

## 2015-06-14 DIAGNOSIS — S91012A Laceration without foreign body, left ankle, initial encounter: Secondary | ICD-10-CM | POA: Diagnosis not present

## 2015-06-14 DIAGNOSIS — S91052A Open bite, left ankle, initial encounter: Secondary | ICD-10-CM | POA: Diagnosis present

## 2015-06-14 DIAGNOSIS — E785 Hyperlipidemia, unspecified: Secondary | ICD-10-CM | POA: Insufficient documentation

## 2015-06-14 DIAGNOSIS — Z85528 Personal history of other malignant neoplasm of kidney: Secondary | ICD-10-CM | POA: Diagnosis not present

## 2015-06-14 DIAGNOSIS — E119 Type 2 diabetes mellitus without complications: Secondary | ICD-10-CM | POA: Insufficient documentation

## 2015-06-14 DIAGNOSIS — Z86010 Personal history of colonic polyps: Secondary | ICD-10-CM | POA: Insufficient documentation

## 2015-06-14 DIAGNOSIS — M797 Fibromyalgia: Secondary | ICD-10-CM | POA: Insufficient documentation

## 2015-06-14 DIAGNOSIS — Z8619 Personal history of other infectious and parasitic diseases: Secondary | ICD-10-CM | POA: Insufficient documentation

## 2015-06-14 MED ORDER — AMOXICILLIN-POT CLAVULANATE 875-125 MG PO TABS: 1.0000 | ORAL_TABLET | Freq: Two times a day (BID) | ORAL | Status: DC

## 2015-06-14 MED ORDER — OXYCODONE-ACETAMINOPHEN 5-325 MG PO TABS
2.0000 | ORAL_TABLET | Freq: Once | ORAL | Status: AC
  Administered 2015-06-14: 2 via ORAL
  Filled 2015-06-14: qty 2

## 2015-06-14 MED ORDER — TETANUS-DIPHTH-ACELL PERTUSSIS 5-2.5-18.5 LF-MCG/0.5 IM SUSP
0.5000 mL | Freq: Once | INTRAMUSCULAR | Status: AC
  Administered 2015-06-14: 0.5 mL via INTRAMUSCULAR
  Filled 2015-06-14: qty 0.5

## 2015-06-14 MED ORDER — LIDOCAINE-EPINEPHRINE (PF) 2 %-1:200000 IJ SOLN
10.0000 mL | Freq: Once | INTRAMUSCULAR | Status: AC
  Administered 2015-06-14: 10 mL via INTRADERMAL
  Filled 2015-06-14: qty 20

## 2015-06-14 NOTE — ED Notes (Signed)
LEFT DP DOPPLERED WITHOUT DIFFICULTY.

## 2015-06-14 NOTE — Discharge Instructions (Signed)
Follow up with Dr. Oneida Alar and Dr. Sheryle Hail. Make appointments to be seen by both on Monday.   Return to the emergency department if you experience increased pain, redness, swelling, warmth, foul discharge from the wounds, fever, chills.  Animal Bite Animal bites can range from mild to serious. An animal bite can result in a scratch on the skin, a deep open cut, a puncture of the skin, a crush injury, or tearing away of the skin or a body part. A small bite from a house pet will usually not cause serious problems. However, some animal bites can become infected or injure a bone or other tissue.  Bites from certain animals can be more dangerous because of the risk of spreading rabies, which is a serious viral infection. This risk is higher with bites from stray animals or wild animals, such as raccoons, foxes, skunks, and bats. Dogs are responsible for most animal bites. Children are bitten more often than adults. SYMPTOMS  Common symptoms of an animal bite include:   Pain.   Bleeding.   Swelling.   Bruising.  DIAGNOSIS  This condition may be diagnosed based on a physical exam and medical history. Your health care provider will examine the wound and ask for details about the animal and how the bite happened. You may also have tests, such as:   Blood tests to check for infection or to determine if surgery is needed.  X-rays to check for damage to bones or joints.  Culture test. This uses a sample of fluid from the wound to check for infection. TREATMENT  Treatment varies depending on the location and type of animal bite and your medical history. Treatment may include:   Wound care. This often includes cleaning the wound, flushing the wound with saline solution, and applying a bandage (dressing). Sometimes, the wound is left open to heal because of the high risk of infection. However, in some cases, the wound may be closed with stitches (sutures), staples, skin glue, or adhesive strips.    Antibiotic medicine.   Tetanus shot.   Rabies treatment if the animal could have rabies.  In some cases, bites that have become infected may require IV antibiotics and surgical treatment in the hospital.  [place]  Follow instructions from your health care provider about how to take care of your wound. Make sure you:  Wash your hands with soap and water before you change your dressing. If soap and water are not available, use hand sanitizer.  Change your dressing as told by your health care provider.  Leave sutures, skin glue, or adhesive strips in place. These skin closures may need to be in place for 2 weeks or longer. If adhesive strip edges start to loosen and curl up, you may trim the loose edges. Do not remove adhesive strips completely unless your health care provider tells you to do that.  Check your wound every day for signs of infection. Watch for:   Increasing redness, swelling, or pain.   Fluid, blood, or pus.  General Instructions  Take or apply over-the-counter and prescription medicines only as told by your health care provider.   If you were prescribed an antibiotic, take or apply it as told by your health care provider. Do not stop using the antibiotic even if your condition improves.   Keep the injured area raised (elevated) above the level of your heart while you are sitting or lying down, if this is possible.   If directed,  apply ice to the injured area.   Put ice in a plastic bag.   Place a towel between your skin and the bag.   Leave the ice on for 20 minutes, 2-3 times per day.   Keep all follow-up visits as told by your health care provider. This is important.  SEEK MEDICAL CARE IF:  You have increasing redness, swelling, or pain at the site of your wound.   You have a general feeling of sickness (malaise).   You feel nauseous or you vomit.   You have pain that does not get better.  SEEK  IMMEDIATE MEDICAL CARE IF:  You have a red streak extending away from your wound.   You have fluid, blood, or pus coming from your wound.   You have a fever or chills.   You have trouble moving your injured area.   You have numbness or tingling extending beyond the wound.   This information is not intended to replace advice given to you by your health care provider. Make sure you discuss any questions you have with your health care provider.   Document Released: 10/28/2010 Document Revised: 10/31/2014 Document Reviewed: 06/27/2014 Elsevier Interactive Patient Education [employer].

## 2015-06-14 NOTE — ED Notes (Signed)
Pt given OJ 

## 2015-06-14 NOTE — ED Provider Notes (Signed)
CSN: [id number]     Arrival date & time 06/14/15  1047 History  By signing my name below, I, Stephania Fragmin, attest that this documentation has been prepared under the direction and in the presence of Jackson Latino, PA-C. Electronically Signed: Stephania Fragmin, ED Scribe. 06/14/2015. 11:56 AM.    Chief Complaint  Patient presents with  . Animal Bite   The history is provided by the patient. No language interpreter was used.   HPI Comments: April Branch is a 70 y.o. female with a history of IDDM, neuropathy, hypertension, MI, who presents to the Emergency Department complaining of dog bites on the left lower leg, with the largest on her left medial ankle, that onset PTA. She states two of her dogs were fighting in a room when she came to break up the fight, and her 40-lb dog bit her on the left ankle. She denies any falls, head injury, or LOC. She states her dog had never bitten anyone else before. She states her dog is UTD on all vaccinations. Patient does report loss of sensation in her toes although she states this is baseline, due to her history of neuropathy. Patient reports a history of femoral-popliteal bypass graft. She reports her tetanus vaccination is out of date.   Past Medical History  Diagnosis Date  . Hyperlipidemia   . Arthritis   . Hypothyroidism   . GERD (gastroesophageal reflux disease)   . Anxiety   . Diabetes mellitus age 56    insulin dependent  . Peripheral vascular disease ([place])   . Lumbar disc disease   . Insulin dependent type 2 diabetes mellitus, controlled ([place])   . GERD 07/20/2006  . CAD (coronary artery disease)   . CAD (coronary artery disease) of bypass graft   . Vertigo   . History of transient ischemic attack (TIA)   . Myocardial infarction ([place])     AGE 31    . Hypertension   . Depression   . COPD 12/14/2008    PATIENT DENIES    . Personal history of malignant neoplasm of kidney(V10.52) 12/14/2008    Laparoscopic biopsy and cryoablation 7/08 Dr.  Vernie Shanks   . Headache(784.0)   . Neuromuscular disorder ([place])     PERIPHERAL NEUROPATHY  . Anemia   . Diabetic coma ([place]) Feb. 2014  . Fall at home Jan. 2, 2016  Jan. 13, 2016    Left knee  . Anginal pain ([place])     1 MONTH AGO   . Stroke [facility])     3 MINI STROKES  RT SIDED WEAKNESS  . Fibromyalgia   . HPV (human papilloma virus) infection   . Colon polyps 09/11/2010    Tubular adenoma  . 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 Appendectomy    . Abdominal hysterectomy    . Knee surgery Bilateral     "cartilage"  . Cholecystectomy    . Lung surgery      lung nodule removed from the right side  . Laparascopic cryoablation of left kidney  08/2006    Dr. Gladis Riffle for renal cell cancer  . Bladder suspension    . Shoulder surgery Bilateral     'Spurs"  . Foot surgery    . Pr vein bypass graft,aorto-fem-pop  05/27/2010  . Cardiac catheterization    . Femoral-popliteal bypass graft  10/12/2011    Procedure: BYPASS GRAFT  FEMORAL-POPLITEAL ARTERY;  Surgeon: Elam Dutch, MD;  Location: [facility] OR;  Service: Vascular;  Laterality: Left;  Left Femoral-Popliteal Bypass Graft using 52mm x 80cm Propaten Graft with intraop arteriogram times one.  . Intraoperative arteriogram  10/12/2011    Procedure: INTRA OPERATIVE ARTERIOGRAM;  Surgeon: Elam Dutch, MD;  Location: [place];  Service: Vascular;  Laterality: Left;  . Left heart catheterization with coronary angiogram N/A 05/11/2011    Procedure: LEFT HEART CATHETERIZATION WITH CORONARY ANGIOGRAM;  Surgeon: Jolaine Artist, MD;  Location: [facility] CATH LAB;  Service: Cardiovascular;  Laterality: N/A;  . Abdominal aortagram N/A 08/21/2011    Procedure: ABDOMINAL Maxcine Ham;  Surgeon: Elam Dutch, MD;  Location: [facility] CATH LAB;  Service: Cardiovascular;  Laterality: N/A;  . Abdominal aortagram N/A 03/16/2014    Procedure: ABDOMINAL Maxcine Ham;  Surgeon: Elam Dutch, MD;  Location: [facility] CATH LAB;  Service: Cardiovascular;  Laterality: N/A;  . Femoral-popliteal bypass graft Left 04/17/2014    Procedure: REDO LEFT FEMORAL-POPLITEAL ARTERY BYPASS USING GORE PROPATEN 16mmx80cm GRAFT;  Surgeon: Elam Dutch, MD;  Location: [place];  Service: Vascular;  Laterality: Left;  . Esophagogastroduodenoscopy (egd) with propofol N/A 05/16/2014    Procedure: ESOPHAGOGASTRODUODENOSCOPY (EGD) WITH PROPOFOL with Balloon dilation;  Surgeon: Milus Banister, MD;  Location: [place];  Service: Endoscopy;  Laterality: N/A;   Family History  Problem Relation Age of Onset  . Heart disease Mother   . Lung cancer Mother   . Cancer Mother     Lung  . Heart disease Father   . Lung cancer Father   . Cancer Father     Lung  . Heart disease Sister     CABG- Open Heart   Social History  Substance Use Topics  . Smoking status: Former Smoker -- 0.25 packs/day for 50 years    Types: Cigarettes    Quit date: 03/17/2014  . Smokeless tobacco: Never Used  . Alcohol Use: No   OB History    No data available     Review of Systems  Constitutional: Negative for fever.  HENT: Negative for trouble swallowing.   Respiratory: Negative for shortness of breath.   Cardiovascular: Negative for chest pain and leg swelling.  Musculoskeletal: Negative for joint swelling.  Skin: Positive for wound.  Neurological: Positive for numbness (baseline due to neuropathy, per pt). Negative for dizziness and syncope.  Psychiatric/Behavioral: Negative for confusion.  All other systems reviewed and are negative.     Allergies  Review of patient's allergies indicates no known allergies.  Home Medications   Prior to Admission medications   Medication Sig Start Date End Date Taking? Authorizing Provider  ACCU-CHEK AVIVA PLUS test strip  01/24/14   Historical Provider, MD  ACCU-CHEK FASTCLIX LANCETS MISC Use 4 per day to check blood sugar dx code E11.9 11/29/14   Elayne Snare, MD  Alcohol  Swabs (B-D SINGLE USE SWABS REGULAR) PADS  03/21/13   Historical Provider, MD  amoxicillin-clavulanate (AUGMENTIN) 875-125 MG tablet Take 1 tablet by mouth 2 (two) times daily. 06/14/15   Kalman Drape, PA  canagliflozin (INVOKANA) 100 MG TABS tablet 1 tablet before breakfast 05/24/15   Elayne Snare, MD  cilostazol (PLETAL) 100 MG tablet Take 1 tablet (100 mg total) by mouth 2 (two) times daily before a meal. Patient not taking: Reported on 05/24/2015 05/23/15   Sharmon Leyden Nickel, NP  clopidogrel (PLAVIX) 75 MG tablet Take 75 mg by mouth daily.  04/02/14   Historical Provider,  MD  DULoxetine (CYMBALTA) 60 MG capsule TAKE ONE CAPSULE BY MOUTH EVERY DAY 04/01/15   Elayne Snare, MD  ferrous sulfate 325 (65 FE) MG tablet Take 325 mg by mouth 2 (two) times daily with a meal.    Historical Provider, MD  gabapentin (NEURONTIN) 100 MG capsule  01/07/15   Historical Provider, MD  HYDROcodone-acetaminophen (NORCO) 10-325 MG per tablet Take 1 tablet by mouth every 4 (four) hours as needed (pain). 04/20/14   Alvia Grove, PA-C  Insulin Glargine (TOUJEO SOLOSTAR) 300 UNIT/ML SOPN Inject 60 Units into the skin 2 (two) times daily. Patient taking differently: Inject 60 Units into the skin daily.  10/01/14   Elayne Snare, MD  Insulin Pen Needle (BD PEN NEEDLE NANO U/F) 32G X 4 MM MISC Use to inject insulin 10/30/14   Elayne Snare, MD  KLOR-CON 10 10 MEQ tablet TAKE 1 TABLET BY MOUTH EVERY DAY 04/01/15   Elayne Snare, MD  lansoprazole (PREVACID) 30 MG capsule Take 1 capsule (30 mg total) by mouth 2 (two) times daily before a meal. 05/16/14   Milus Banister, MD  levothyroxine (SYNTHROID, LEVOTHROID) 137 MCG tablet TAKE 1 TABLET (137 MCG TOTAL) BY MOUTH DAILY BEFORE BREAKFAST. 12/27/14   Elayne Snare, MD  lisinopril (PRINIVIL,ZESTRIL) 5 MG tablet TAKE 1 TABLET (5 MG TOTAL) BY MOUTH DAILY. 04/22/15   Elayne Snare, MD  LORazepam (ATIVAN) 1 MG tablet Take 3 mg by mouth at bedtime.    Historical Provider, MD  metFORMIN (GLUCOPHAGE XR) 750 MG 24 hr  tablet take 2 tablets daily 11/30/14   Elayne Snare, MD  nortriptyline (PAMELOR) 75 MG capsule Take 75 mg by mouth at bedtime. 01/24/14   Historical Provider, MD  NOVOLOG FLEXPEN 100 UNIT/ML FlexPen INJECT 10-15 UNITS INTO THE SKIN 3 TIMES A DAY WITH MEALS 01/26/15   Historical Provider, MD  ondansetron (ZOFRAN) 8 MG tablet Take 1 tablet (8 mg total) by mouth every 8 (eight) hours as needed for nausea or vomiting. 11/27/14   Daleen Bo, MD  pravastatin (PRAVACHOL) 20 MG tablet Take 20 mg by mouth daily.    Historical Provider, MD  tolterodine (DETROL LA) 4 MG 24 hr capsule Take 4 mg by mouth daily.     Historical Provider, MD   BP 147/101 mmHg  Pulse 102  Temp(Src) 98.5 F (36.9 C) (Oral)  Resp 16  SpO2 97% Physical Exam  Constitutional: She appears well-developed and well-nourished. No distress.  HENT:  Head: Normocephalic and atraumatic.  Eyes: Conjunctivae are normal.  Neck: Neck supple.  Cardiovascular:  Pulses:      Dorsalis pedis pulses are 1+ on the right side, and 0 on the left side.  Pulmonary/Chest: Effort normal.  Musculoskeletal:  3.5 cm laceration to left medial ankle, scattered abrasions and a few small puncture wounds to the lateral ankle/calf, TTP of medial malleoli.   Neurological: She is alert. Coordination normal.  Skin: Skin is warm and dry. No rash noted. She is not diaphoretic.  Psychiatric: She has a normal mood and affect. Her behavior is normal.  Nursing note and vitals reviewed.   ED Course  Procedures (including critical care time)  DIAGNOSTIC STUDIES: Oxygen Saturation is 97% on RA, normal by my interpretation.    COORDINATION OF CARE: 10:59 AM - Discussed treatment plan with pt at bedside. Pt verbalized understanding and agreed to plan.   11:19 AM - Dr. Tyrone Nine in room to evaluate pt.   Labs Review Labs Reviewed - No data to display  Imaging Review Dg Tibia/fibula Left  06/14/2015  CLINICAL DATA:  Pain following dog bite EXAM: LEFT TIBIA AND  FIBULA - 2 VIEW COMPARISON:  None. FINDINGS: Frontal and lateral views were obtained. No fracture or dislocation. Joint spaces appear normal. No radiopaque foreign body. There is plantar fascia all calcification in the hindfoot region. There are posterior and inferior calcaneal spurs. IMPRESSION: No radiopaque foreign body. No fracture or dislocation. Plantar fascia calcification present. Electronically Signed   By: Lowella Grip III M.D.   On: 06/14/2015 12:41   Dg Ankle Complete Left  06/14/2015  CLINICAL DATA:  Pain following dog bite EXAM: LEFT ANKLE COMPLETE - 3+ VIEW COMPARISON:  November 06, 2011 FINDINGS: Frontal, oblique, and lateral views obtained. There is no fracture or joint effusion. No radiopaque foreign body. Ankle mortise appears intact. There are posterior and inferior calcaneal spurs. There is stable hindfoot plantar calcification consistent with special calcification. IMPRESSION: Calcaneal spurs with calcification in the plantar fascia posteriorly. No radiopaque foreign body or fracture. Ankle mortise appears intact. Electronically Signed   By: Lowella Grip III M.D.   On: 06/14/2015 12:44   I have personally reviewed and evaluated these images and lab results as part of my medical decision-making.    MDM   Final diagnoses:  Dog bite of left lower leg, initial encounter   Patient with a dog bite to her lower left leg that occurred just prior to arrival. No current signs of infection. It was her dog which she states was up to date on vaccines. We updated her Tdap in the ED. History of arterial insufficiency. Xrays were performed. I reviewed the results and no foreign body or fracture noted. Wound was irrigated with sterile saline and bandage applied.   BP was 147/101 at the time of arrival to the ED but was 113/59 at discharge. No BP meds were given while in the ED. BP likely due to pain on arrival.   Instructed patient to follow up with Dr. Oneida Alar and Dr. Sheryle Hail (PCP)  on Monday. I stressed to her that she needs close follow up due to her peripheral arterial occlusive disease and T2DM, concern for poor wound healing and risk of infection. I discussed strict return precautions and wound care. I discussed the plan of care with Dr. Tyrone Nine. He saw the patient and agreed with the plan of care.   I discussed all of the results with the patient and she expressed understanding to the verbal discharge instructions.   I personally performed the services described in this documentation, which was scribed in my presence. The recorded information has been reviewed and is accurate.     Kalman Drape, PA 06/14/15 [place], DO 06/14/15 1523

## 2015-06-14 NOTE — ED Notes (Signed)
Pt reports her own dog bit her today. She has laceration to left medial ankle and superficial scratch marks to left lower leg. +pedal pulse and pt able to wiggle her toes.

## 2015-06-14 NOTE — ED Notes (Addendum)
Dog bite to left ankle. Pt's own dog, shots UTD. Pt needs DT.  Also has large abrasions to left calf area. Hx of PVD with precious vascular sx in injured extremity. Sees Dr. Oneida Alar.

## 2015-06-24 ENCOUNTER — Ambulatory Visit (INDEPENDENT_AMBULATORY_CARE_PROVIDER_SITE_OTHER): Payer: Medicare Other | Admitting: Endocrinology

## 2015-06-24 ENCOUNTER — Emergency Department (HOSPITAL_COMMUNITY): Payer: Medicare Other

## 2015-06-24 ENCOUNTER — Observation Stay (HOSPITAL_COMMUNITY)
Admission: EM | Admit: 2015-06-24 | Discharge: 2015-06-28 | Disposition: A | Discharge status: Home / Self Care | Payer: Medicare Other | Attending: Family Medicine | Admitting: Family Medicine

## 2015-06-24 ENCOUNTER — Encounter (HOSPITAL_COMMUNITY): Payer: Self-pay | Admitting: *Deleted

## 2015-06-24 ENCOUNTER — Encounter: Payer: Self-pay | Admitting: Endocrinology

## 2015-06-24 VITALS — BP 80/40 | HR 93 | Temp 98.3°F | Resp 14 | Ht 62.0 in | Wt 167.0 lb

## 2015-06-24 DIAGNOSIS — Z794 Long term (current) use of insulin: Secondary | ICD-10-CM

## 2015-06-24 DIAGNOSIS — I951 Orthostatic hypotension: Secondary | ICD-10-CM | POA: Diagnosis not present

## 2015-06-24 DIAGNOSIS — L97529 Non-pressure chronic ulcer of other part of left foot with unspecified severity: Secondary | ICD-10-CM | POA: Insufficient documentation

## 2015-06-24 DIAGNOSIS — E1165 Type 2 diabetes mellitus with hyperglycemia: Secondary | ICD-10-CM

## 2015-06-24 DIAGNOSIS — G894 Chronic pain syndrome: Secondary | ICD-10-CM | POA: Diagnosis not present

## 2015-06-24 DIAGNOSIS — I2510 Atherosclerotic heart disease of native coronary artery without angina pectoris: Secondary | ICD-10-CM | POA: Diagnosis not present

## 2015-06-24 DIAGNOSIS — N183 Chronic kidney disease, stage 3 (moderate): Secondary | ICD-10-CM | POA: Insufficient documentation

## 2015-06-24 DIAGNOSIS — I10 Essential (primary) hypertension: Secondary | ICD-10-CM | POA: Insufficient documentation

## 2015-06-24 DIAGNOSIS — D631 Anemia in chronic kidney disease: Secondary | ICD-10-CM | POA: Diagnosis not present

## 2015-06-24 DIAGNOSIS — R112 Nausea with vomiting, unspecified: Secondary | ICD-10-CM

## 2015-06-24 DIAGNOSIS — I70209 Unspecified atherosclerosis of native arteries of extremities, unspecified extremity: Secondary | ICD-10-CM | POA: Diagnosis not present

## 2015-06-24 DIAGNOSIS — I129 Hypertensive chronic kidney disease with stage 1 through stage 4 chronic kidney disease, or unspecified chronic kidney disease: Secondary | ICD-10-CM | POA: Insufficient documentation

## 2015-06-24 DIAGNOSIS — E872 Acidosis, unspecified: Secondary | ICD-10-CM

## 2015-06-24 DIAGNOSIS — K219 Gastro-esophageal reflux disease without esophagitis: Secondary | ICD-10-CM | POA: Diagnosis not present

## 2015-06-24 DIAGNOSIS — E11621 Type 2 diabetes mellitus with foot ulcer: Secondary | ICD-10-CM | POA: Diagnosis not present

## 2015-06-24 DIAGNOSIS — E1140 Type 2 diabetes mellitus with diabetic neuropathy, unspecified: Secondary | ICD-10-CM | POA: Insufficient documentation

## 2015-06-24 DIAGNOSIS — I252 Old myocardial infarction: Secondary | ICD-10-CM | POA: Insufficient documentation

## 2015-06-24 DIAGNOSIS — E1122 Type 2 diabetes mellitus with diabetic chronic kidney disease: Secondary | ICD-10-CM | POA: Diagnosis not present

## 2015-06-24 DIAGNOSIS — Z79899 Other long term (current) drug therapy: Secondary | ICD-10-CM | POA: Insufficient documentation

## 2015-06-24 DIAGNOSIS — L97509 Non-pressure chronic ulcer of other part of unspecified foot with unspecified severity: Secondary | ICD-10-CM

## 2015-06-24 DIAGNOSIS — E785 Hyperlipidemia, unspecified: Secondary | ICD-10-CM | POA: Insufficient documentation

## 2015-06-24 DIAGNOSIS — D7589 Other specified diseases of blood and blood-forming organs: Secondary | ICD-10-CM | POA: Diagnosis not present

## 2015-06-24 DIAGNOSIS — I739 Peripheral vascular disease, unspecified: Secondary | ICD-10-CM

## 2015-06-24 DIAGNOSIS — E039 Hypothyroidism, unspecified: Secondary | ICD-10-CM | POA: Diagnosis present

## 2015-06-24 DIAGNOSIS — E162 Hypoglycemia, unspecified: Secondary | ICD-10-CM | POA: Diagnosis present

## 2015-06-24 DIAGNOSIS — E119 Type 2 diabetes mellitus without complications: Secondary | ICD-10-CM

## 2015-06-24 DIAGNOSIS — R29898 Other symptoms and signs involving the musculoskeletal system: Secondary | ICD-10-CM

## 2015-06-24 DIAGNOSIS — Z7902 Long term (current) use of antithrombotics/antiplatelets: Secondary | ICD-10-CM | POA: Insufficient documentation

## 2015-06-24 DIAGNOSIS — Z8673 Personal history of transient ischemic attack (TIA), and cerebral infarction without residual deficits: Secondary | ICD-10-CM | POA: Diagnosis not present

## 2015-06-24 DIAGNOSIS — R0602 Shortness of breath: Secondary | ICD-10-CM

## 2015-06-24 DIAGNOSIS — R197 Diarrhea, unspecified: Secondary | ICD-10-CM

## 2015-06-24 DIAGNOSIS — J449 Chronic obstructive pulmonary disease, unspecified: Secondary | ICD-10-CM | POA: Diagnosis present

## 2015-06-24 DIAGNOSIS — I959 Hypotension, unspecified: Secondary | ICD-10-CM | POA: Insufficient documentation

## 2015-06-24 LAB — COMPREHENSIVE METABOLIC PANEL
ALK PHOS: 88 U/L (ref 38–126)
ALT: 11 U/L — AB (ref 14–54)
ANION GAP: 12 (ref 5–15)
AST: 14 U/L — ABNORMAL LOW (ref 15–41)
Albumin: 3.4 g/dL — ABNORMAL LOW (ref 3.5–5.0)
BUN: 17 mg/dL (ref 6–20)
CALCIUM: 9.1 mg/dL (ref 8.9–10.3)
CO2: 20 mmol/L — ABNORMAL LOW (ref 22–32)
CREATININE: 1.71 mg/dL — AB (ref 0.44–1.00)
Chloride: 109 mmol/L (ref 101–111)
GFR, EST AFRICAN AMERICAN: 35 mL/min — AB (ref >60)
GFR, EST NON AFRICAN AMERICAN: 30 mL/min — AB (ref >60)
Glucose, Bld: 63 mg/dL — ABNORMAL LOW (ref 65–99)
Potassium: 3.6 mmol/L (ref 3.5–5.1)
Sodium: 141 mmol/L (ref 135–145)
TOTAL PROTEIN: 7.5 g/dL (ref 6.5–8.1)
Total Bilirubin: 0.2 mg/dL — ABNORMAL LOW (ref 0.3–1.2)

## 2015-06-24 LAB — CBC WITH DIFFERENTIAL/PLATELET
Basophils Absolute: 0.1 10*3/uL (ref 0.0–0.1)
Basophils Relative: 1 %
EOS ABS: 0.2 10*3/uL (ref 0.0–0.7)
EOS PCT: 2 %
HCT: 35.7 % — ABNORMAL LOW (ref 36.0–46.0)
HEMOGLOBIN: 11.1 g/dL — AB (ref 12.0–15.0)
LYMPHS ABS: 3.2 10*3/uL (ref 0.7–4.0)
LYMPHS PCT: 30 %
MCH: 25.5 pg — AB (ref 26.0–34.0)
MCHC: 31.1 g/dL (ref 30.0–36.0)
MCV: 81.9 fL (ref 78.0–100.0)
MONOS PCT: 7 %
Monocytes Absolute: 0.7 10*3/uL (ref 0.1–1.0)
NEUTROS ABS: 6.7 10*3/uL (ref 1.7–7.7)
Neutrophils Relative %: 60 %
Platelets: 446 10*3/uL — ABNORMAL HIGH (ref 150–400)
RBC: 4.36 MIL/uL (ref 3.87–5.11)
RDW: 15 % (ref 11.5–15.5)
WBC: 10.9 10*3/uL — ABNORMAL HIGH (ref 4.0–10.5)

## 2015-06-24 LAB — I-STAT CG4 LACTIC ACID, ED
LACTIC ACID, VENOUS: 2.58 mmol/L — AB (ref 0.5–2.0)
LACTIC ACID, VENOUS: 2.71 mmol/L — AB (ref 0.5–2.0)

## 2015-06-24 LAB — I-STAT TROPONIN, ED: Troponin i, poc: 0.01 ng/mL (ref 0.00–0.08)

## 2015-06-24 LAB — URINALYSIS, ROUTINE W REFLEX MICROSCOPIC
Bilirubin Urine: NEGATIVE
KETONES UR: NEGATIVE mg/dL
LEUKOCYTES UA: NEGATIVE
NITRITE: NEGATIVE
PROTEIN: NEGATIVE mg/dL
Specific Gravity, Urine: 1.024 (ref 1.005–1.030)
pH: 5 (ref 5.0–8.0)

## 2015-06-24 LAB — URINE MICROSCOPIC-ADD ON

## 2015-06-24 LAB — TYPE AND SCREEN
ABO/RH(D): O POS
ANTIBODY SCREEN: NEGATIVE

## 2015-06-24 LAB — CBG MONITORING, ED
Glucose-Capillary: 179 mg/dL — ABNORMAL HIGH (ref 65–99)
Glucose-Capillary: 55 mg/dL — ABNORMAL LOW (ref 65–99)

## 2015-06-24 LAB — POC OCCULT BLOOD, ED: FECAL OCCULT BLD: NEGATIVE

## 2015-06-24 MED ORDER — CLOPIDOGREL BISULFATE 75 MG PO TABS
75.0000 mg | ORAL_TABLET | Freq: Every day | ORAL | Status: DC
  Administered 2015-06-25 – 2015-06-27 (×3): 75 mg via ORAL
  Filled 2015-06-24 (×3): qty 1

## 2015-06-24 MED ORDER — PIPERACILLIN-TAZOBACTAM 3.375 G IVPB 30 MIN
3.3750 g | Freq: Once | INTRAVENOUS | Status: AC
  Administered 2015-06-24: 3.375 g via INTRAVENOUS
  Filled 2015-06-24: qty 50

## 2015-06-24 MED ORDER — FESOTERODINE FUMARATE ER 8 MG PO TB24
8.0000 mg | ORAL_TABLET | Freq: Every day | ORAL | Status: DC
  Administered 2015-06-25 – 2015-06-28 (×4): 8 mg via ORAL
  Filled 2015-06-24 (×4): qty 1

## 2015-06-24 MED ORDER — PANTOPRAZOLE SODIUM 40 MG PO TBEC
40.0000 mg | DELAYED_RELEASE_TABLET | Freq: Every day | ORAL | Status: DC
  Administered 2015-06-25 – 2015-06-28 (×4): 40 mg via ORAL
  Filled 2015-06-24 (×4): qty 1

## 2015-06-24 MED ORDER — DEXTROSE 50 % IV SOLN
1.0000 | Freq: Once | INTRAVENOUS | Status: AC
  Administered 2015-06-24: 50 mL via INTRAVENOUS
  Filled 2015-06-24: qty 50

## 2015-06-24 MED ORDER — PANTOPRAZOLE SODIUM 40 MG IV SOLR
40.0000 mg | INTRAVENOUS | Status: AC
  Administered 2015-06-24: 40 mg via INTRAVENOUS
  Filled 2015-06-24: qty 40

## 2015-06-24 MED ORDER — LORAZEPAM 1 MG PO TABS
1.0000 mg | ORAL_TABLET | Freq: Two times a day (BID) | ORAL | Status: DC | PRN
  Administered 2015-06-25 – 2015-06-27 (×3): 1 mg via ORAL
  Filled 2015-06-24 (×3): qty 1

## 2015-06-24 MED ORDER — ONDANSETRON HCL 4 MG/2ML IJ SOLN
4.0000 mg | Freq: Four times a day (QID) | INTRAMUSCULAR | Status: DC | PRN
  Administered 2015-06-24: 4 mg via INTRAVENOUS
  Filled 2015-06-24: qty 2

## 2015-06-24 MED ORDER — SODIUM CHLORIDE 0.9 % IV BOLUS (SEPSIS)
1000.0000 mL | Freq: Once | INTRAVENOUS | Status: AC
  Administered 2015-06-24: 1000 mL via INTRAVENOUS

## 2015-06-24 MED ORDER — POTASSIUM CHLORIDE IN NACL 20-0.9 MEQ/L-% IV SOLN
INTRAVENOUS | Status: DC
  Administered 2015-06-25 – 2015-06-26 (×3): via INTRAVENOUS
  Filled 2015-06-24 (×4): qty 1000

## 2015-06-24 MED ORDER — ENOXAPARIN SODIUM 40 MG/0.4ML ~~LOC~~ SOLN: 40.0000 mg | SUBCUTANEOUS | Status: DC

## 2015-06-24 MED ORDER — PRAVASTATIN SODIUM 20 MG PO TABS
20.0000 mg | ORAL_TABLET | Freq: Every day | ORAL | Status: DC
  Administered 2015-06-25 – 2015-06-28 (×4): 20 mg via ORAL
  Filled 2015-06-24 (×4): qty 1

## 2015-06-24 MED ORDER — HYDROMORPHONE HCL 1 MG/ML IJ SOLN
0.5000 mg | Freq: Once | INTRAMUSCULAR | Status: AC
  Administered 2015-06-24: 0.5 mg via INTRAVENOUS
  Filled 2015-06-24: qty 1

## 2015-06-24 MED ORDER — SODIUM CHLORIDE 0.9% FLUSH
3.0000 mL | Freq: Two times a day (BID) | INTRAVENOUS | Status: DC
  Administered 2015-06-25 (×3): 3 mL via INTRAVENOUS

## 2015-06-24 MED ORDER — GABAPENTIN 100 MG PO CAPS
200.0000 mg | ORAL_CAPSULE | Freq: Three times a day (TID) | ORAL | Status: DC
  Administered 2015-06-25 – 2015-06-28 (×11): 200 mg via ORAL
  Filled 2015-06-24 (×11): qty 2

## 2015-06-24 MED ORDER — HYDROCODONE-ACETAMINOPHEN 10-325 MG PO TABS
1.0000 | ORAL_TABLET | ORAL | Status: DC | PRN
  Administered 2015-06-25 – 2015-06-28 (×15): 1 via ORAL
  Filled 2015-06-24 (×15): qty 1

## 2015-06-24 MED ORDER — DULOXETINE HCL 60 MG PO CPEP
60.0000 mg | ORAL_CAPSULE | Freq: Every day | ORAL | Status: DC
  Administered 2015-06-25 – 2015-06-28 (×4): 60 mg via ORAL
  Filled 2015-06-24 (×4): qty 1

## 2015-06-24 MED ORDER — ONDANSETRON HCL 4 MG PO TABS: 4.0000 mg | ORAL_TABLET | Freq: Four times a day (QID) | ORAL | Status: DC | PRN

## 2015-06-24 MED ORDER — INSULIN ASPART 100 UNIT/ML ~~LOC~~ SOLN
0.0000 [IU] | Freq: Three times a day (TID) | SUBCUTANEOUS | Status: DC
  Administered 2015-06-25 – 2015-06-26 (×2): 2 [IU] via SUBCUTANEOUS
  Administered 2015-06-26 – 2015-06-27 (×2): 3 [IU] via SUBCUTANEOUS
  Administered 2015-06-27: 5 [IU] via SUBCUTANEOUS
  Administered 2015-06-28: 3 [IU] via SUBCUTANEOUS

## 2015-06-24 MED ORDER — LEVOTHYROXINE SODIUM 25 MCG PO TABS
137.0000 ug | ORAL_TABLET | Freq: Every day | ORAL | Status: DC
  Administered 2015-06-25 – 2015-06-28 (×4): 137 ug via ORAL
  Filled 2015-06-24 (×5): qty 1

## 2015-06-24 MED ORDER — NORTRIPTYLINE HCL 25 MG PO CAPS
75.0000 mg | ORAL_CAPSULE | Freq: Every day | ORAL | Status: DC
  Administered 2015-06-25 – 2015-06-27 (×4): 75 mg via ORAL
  Filled 2015-06-24 (×4): qty 3

## 2015-06-24 MED ORDER — FERROUS SULFATE 325 (65 FE) MG PO TABS
325.0000 mg | ORAL_TABLET | Freq: Two times a day (BID) | ORAL | Status: DC
  Administered 2015-06-25 – 2015-06-28 (×7): 325 mg via ORAL
  Filled 2015-06-24 (×7): qty 1

## 2015-06-24 MED ORDER — PIPERACILLIN-TAZOBACTAM 3.375 G IVPB
3.3750 g | Freq: Three times a day (TID) | INTRAVENOUS | Status: DC
  Administered 2015-06-25 – 2015-06-27 (×8): 3.375 g via INTRAVENOUS
  Filled 2015-06-24 (×12): qty 50

## 2015-06-24 NOTE — Patient Instructions (Addendum)
Stop Invokana and Lisinopril  Toujeo 60 units, take Novolog based on what you plan to eat, at least 10 units  Go to ER now for low BP and black vomitus ? GI bleed

## 2015-06-24 NOTE — Progress Notes (Signed)
Patient ID: April Branch, female   DOB: 1946-01-03, 70 y.o.   MRN: [id number]           Reason for Appointment: Follow-up  for Type 2 Diabetes  Referring physician: Pattricia Boss  History of Present Illness:          Date of diagnosis of type 2 diabetes mellitus: 1980s       Previous history:    She thinks she was treated with metformin for some time before she went on insulin in ?  2002   She has mostly been treated with Lantus and rapid acting insulin such as Apidra or Novolog.   She thinks that Apidra worked fairly fast but insurance did not cover this, generally has had poor control   Recent history:   INSULIN regimen is described as:   Toujeo 70 units in am, 15 Novolog before meals  On her initial consultation she was switched from Lantus to [facility] because of blood sugars mostly over 300 Was also started on metformin ER 750 mg, 2 tablets daily initially and also on her last visit in 3/17 she was started on Invokana 100 mg daily because of persistently poor control  Her A1c on the last visit was 8.4  Current blood sugar patterns and problems identified:  FASTING blood sugars are much improved with mostly normal readings including a couple of low readings recently  Blood sugars are variable rest of the day but generally higher in midday and early afternoon because she does not take her Novolog when she is eating breakfast  Also not taking any insulin when her blood sugars normal before meals, only taking Novolog when blood sugars are relatively higher than normal  Blood sugars to come down adequately if she takes mealtime insulin  Also may sometimes take her mealtime insulin postprandially when blood sugar go up though she has been instructed not to do so  Oral hypoglycemic drugs the patient is taking are:  Metformin ER 750 mg 1 bid, Invokana 100 mg daily, none for 2 days      Side effects from medications have been: none  Compliance with the medical regimen: Inconsistent    Hypoglycemia:   occasional episodes in the mornings, 1 episode at 7:30 PM   Glucose monitoring:  done  2-4  times a day         Glucometer: Accucheck      Blood Glucose readings by monitor download:  Mean values apply above for all meters except median for One Touch  PRE-MEAL Fasting Lunch Dinner Bedtime Overall  Glucose range: 58-136  77-228  92-244  98-253    Mean/median: 83     142   (  Self-care: The diet that the patient has been following is: tries to limit  High-fat foods.     Typical meal intake: Breakfast is variable at 9 AM.  Usually has sandwich for lunch               Dietician visit, most recent: never               Exercise:  some slow walking , not able to walk much because of leg pain   Weight history:  Highest 199 , has not lost any weight recently  Wt Readings from Last 3 Encounters:  06/24/15 167 lb (75.751 kg)  05/24/15 172 lb 9.6 oz (78.291 kg)  05/23/15 171 lb 6.4 oz (77.747 kg)    Glycemic control:   Lab Results  Component  Value Date   HGBA1C 8.4* 05/20/2015   HGBA1C 7.4* 01/07/2015   HGBA1C 11.9 10/01/2014   Lab Results  Component Value Date   MICROALBUR 1.0 10/25/2014   LDLCALC 76 02/23/2008   CREATININE 1.25* 05/20/2015    OTHER active problems: See review of systems      Medication List       This list is accurate as of: 06/24/15  3:03 PM.  Always use your most recent med list.               ACCU-CHEK AVIVA PLUS test strip  Generic drug:  glucose blood     ACCU-CHEK FASTCLIX LANCETS Misc  Use 4 per day to check blood sugar dx code E11.9     amoxicillin-clavulanate 875-125 MG tablet  Commonly known as:  AUGMENTIN  Take 1 tablet by mouth 2 (two) times daily.     B-D SINGLE USE SWABS REGULAR Pads     canagliflozin 100 MG Tabs tablet  Commonly known as:  INVOKANA  1 tablet before breakfast     cilostazol 100 MG tablet  Commonly known as:  PLETAL  Take 1 tablet (100 mg total) by mouth 2 (two) times daily before a meal.      clopidogrel 75 MG tablet  Commonly known as:  PLAVIX  Take 75 mg by mouth daily.     DULoxetine 60 MG capsule  Commonly known as:  CYMBALTA  TAKE ONE CAPSULE BY MOUTH EVERY DAY     ferrous sulfate 325 (65 FE) MG tablet  Take 325 mg by mouth 2 (two) times daily with a meal.     gabapentin 100 MG capsule  Commonly known as:  NEURONTIN     HYDROcodone-acetaminophen 10-325 MG tablet  Commonly known as:  NORCO  Take 1 tablet by mouth every 4 (four) hours as needed (pain).     Insulin Glargine 300 UNIT/ML Sopn  Commonly known as:  TOUJEO SOLOSTAR  Inject 60 Units into the skin 2 (two) times daily.     Insulin Pen Needle 32G X 4 MM Misc  Commonly known as:  BD PEN NEEDLE NANO U/F  Use to inject insulin     KLOR-CON 10 10 MEQ tablet  Generic drug:  potassium chloride  TAKE 1 TABLET BY MOUTH EVERY DAY     lansoprazole 30 MG capsule  Commonly known as:  PREVACID  Take 1 capsule (30 mg total) by mouth 2 (two) times daily before a meal.     levothyroxine 137 MCG tablet  Commonly known as:  SYNTHROID, LEVOTHROID  TAKE 1 TABLET (137 MCG TOTAL) BY MOUTH DAILY BEFORE BREAKFAST.     lisinopril 5 MG tablet  Commonly known as:  PRINIVIL,ZESTRIL  TAKE 1 TABLET (5 MG TOTAL) BY MOUTH DAILY.     LORazepam 1 MG tablet  Commonly known as:  ATIVAN  Take 3 mg by mouth at bedtime.     metFORMIN 750 MG 24 hr tablet  Commonly known as:  GLUCOPHAGE XR  take 2 tablets daily     nortriptyline 75 MG capsule  Commonly known as:  PAMELOR  Take 75 mg by mouth at bedtime.     NOVOLOG FLEXPEN 100 UNIT/ML FlexPen  Generic drug:  insulin aspart  INJECT 10-15 UNITS INTO THE SKIN 3 TIMES A DAY WITH MEALS     ondansetron 8 MG tablet  Commonly known as:  ZOFRAN  Take 1 tablet (8 mg total) by mouth every 8 (eight) hours as needed  for nausea or vomiting.     pravastatin 20 MG tablet  Commonly known as:  PRAVACHOL  Take 20 mg by mouth daily.     tolterodine 4 MG 24 hr capsule  Commonly known  as:  DETROL LA  Take 4 mg by mouth daily.        Allergies: No Known Allergies  Past Medical History  Diagnosis Date  . Hyperlipidemia   . Arthritis   . Hypothyroidism   . GERD (gastroesophageal reflux disease)   . Anxiety   . Diabetes mellitus age 26    insulin dependent  . Peripheral vascular disease ([place])   . Lumbar disc disease   . Insulin dependent type 2 diabetes mellitus, controlled ([place])   . GERD 07/20/2006  . CAD (coronary artery disease)   . CAD (coronary artery disease) of bypass graft   . Vertigo   . History of transient ischemic attack (TIA)   . Myocardial infarction ([place])     AGE 67    . Hypertension   . Depression   . COPD 12/14/2008    PATIENT DENIES    . Personal history of malignant neoplasm of kidney(V10.52) 12/14/2008    Laparoscopic biopsy and cryoablation 7/08 Dr. Vernie Shanks   . Headache(784.0)   . Neuromuscular disorder ([place])     PERIPHERAL NEUROPATHY  . Anemia   . Diabetic coma ([place]) Feb. 2014  . Fall at home Jan. 2, 2016  Jan. 13, 2016    Left knee  . Anginal pain ([place])     1 MONTH AGO   . Stroke [facility])     3 MINI STROKES  RT SIDED WEAKNESS  . Fibromyalgia   . HPV (human papilloma virus) infection   . Colon polyps 09/11/2010    Tubular adenoma  . 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 Appendectomy    . Abdominal hysterectomy    . Knee surgery Bilateral     "cartilage"  . Cholecystectomy    . Lung surgery      lung nodule removed from the right side  . Laparascopic cryoablation of left kidney  08/2006    Dr. Gladis Riffle for renal cell cancer  . Bladder suspension    . Shoulder surgery Bilateral     'Spurs"  . Foot surgery    . Pr vein bypass graft,aorto-fem-pop  05/27/2010  . Cardiac catheterization    . Femoral-popliteal bypass graft  10/12/2011    Procedure: BYPASS GRAFT FEMORAL-POPLITEAL ARTERY;  Surgeon: Elam Dutch, MD;  Location: [facility] OR;   Service: Vascular;  Laterality: Left;  Left Femoral-Popliteal Bypass Graft using 98mm x 80cm Propaten Graft with intraop arteriogram times one.  . Intraoperative arteriogram  10/12/2011    Procedure: INTRA OPERATIVE ARTERIOGRAM;  Surgeon: Elam Dutch, MD;  Location: [place];  Service: Vascular;  Laterality: Left;  . Left heart catheterization with coronary angiogram N/A 05/11/2011    Procedure: LEFT HEART CATHETERIZATION WITH CORONARY ANGIOGRAM;  Surgeon: Jolaine Artist, MD;  Location: [facility] CATH LAB;  Service: Cardiovascular;  Laterality: N/A;  . Abdominal aortagram N/A 08/21/2011    Procedure: ABDOMINAL Maxcine Ham;  Surgeon: Elam Dutch, MD;  Location: [facility] CATH LAB;  Service: Cardiovascular;  Laterality: N/A;  . Abdominal aortagram N/A 03/16/2014    Procedure: ABDOMINAL Maxcine Ham;  Surgeon: Elam Dutch, MD;  Location: [facility] CATH LAB;  Service: Cardiovascular;  Laterality: N/A;  . Femoral-popliteal bypass graft Left 04/17/2014    Procedure: REDO LEFT FEMORAL-POPLITEAL ARTERY BYPASS USING GORE PROPATEN 75mmx80cm GRAFT;  Surgeon: Elam Dutch, MD;  Location: [place];  Service: Vascular;  Laterality: Left;  . Esophagogastroduodenoscopy (egd) with propofol N/A 05/16/2014    Procedure: ESOPHAGOGASTRODUODENOSCOPY (EGD) WITH PROPOFOL with Balloon dilation;  Surgeon: Milus Banister, MD;  Location: [place];  Service: Endoscopy;  Laterality: N/A;    Family History  Problem Relation Age of Onset  . Heart disease Mother   . Lung cancer Mother   . Cancer Mother     Lung  . Heart disease Father   . Lung cancer Father   . Cancer Father     Lung  . Heart disease Sister     CABG- Open Heart    Social History:  reports that she quit smoking about 15 months ago. Her smoking use included Cigarettes. She has a 12.5 pack-year smoking history. She has never used smokeless tobacco. She reports that she does not drink alcohol or use illicit drugs.    Review of Systems   For the last 3 weeks she  says she tends to get lightheaded and has near blackouts when she stands up sometimes but did not report this She is having these symptoms today  Today with only drinking a small amount of coffee she had vomited, this was black in color.  She is on Plavix  Also a couple of weeks ago had a dog bite and was given Augmentin in the emergency room but is concerned about the wound. She has not seen her PCP for this  Lipid history:  She has good control with taking pravastatin but has relatively high triglycerides    Lab Results  Component Value Date   CHOL 128 05/20/2015   HDL 40.20 05/20/2015   LDLCALC 76 02/23/2008   LDLDIRECT 57.0 05/20/2015   TRIG 212.0* 05/20/2015   CHOLHDL 3 05/20/2015            Hypertension: she is taking lisinopril 5 mg daily, Not taking any HCTZ Usually is not having problems with hypotension or lightheadedness  Neurological:    Has no numbness, burning but does have pains , burning and some tingling in feet.   Symptoms better with increasing Cymbalta to 60 mg She is being treated with nortriptyline by PCP also  She has had some peripheral vascular disease and vascular surgeon gave her Pletal, is concerned about the need for possible surgery  Long history of thyroid disease, Has had post ablative hypothyroidism, her dose was increased to 137 previously   Lab Results  Component Value Date   TSH 2.56 05/20/2015    Last  Foot exam findings: Monofilament sensation is markedly reduced across most of the toes and distal plantar surfaces  Physical Examination:  BP 80/40 mmHg  Pulse 93  Temp(Src) 98.3 F (36.8 C)  Resp 14  Ht 5\' 2"  (1.575 m)  Wt 167 lb (75.751 kg)  BMI 30.54 kg/m2  SpO2 96%  Blood pressure was About 78 systolic on standing up and associated with presyncopal symptoms She has a partially healed wound on the right ankle laterally with some surrounding redness, no purulence No edema  ASSESSMENT:  ORTHOSTATIC hypotension and  presyncope.  Although she has not taken Invokana for 2 days she appears to be still orthostatic with near syncopal episodes when she stands up Has markedly decreased blood pressure and not clear if this is related to episode  of vomiting this morning which is black in color possibly indicating GI bleeding. Also possibly has autonomic neuropathy causing orthostasis  Diabetes type 2, uncontrolled   See history of present illness for detailed discussion of his current management, blood sugar patterns and problems identified  Her blood sugars are overall much better with adding Invokana with only small increase in her basal insulin Most of her high readings are related to her not taking mealtime coverage when her previous blood sugar is normal causing readings go up sometimes over 200 Has had mild hypoglycemia on waking up at times indicating she can reduce her basal insulin now   PLAN: She does need to be evaluated further in the emergency room and fluids to be repleted for her hypotension. She was sent to the ER by ambulance from the office  DIABETES management:  Decrease the Toujeo to 60  She needs to be consistently taking mealtime insulin even when the blood sugars are normal before eating which she is not doing. Will hold off on Invokana, however if her blood pressure is controlled may try this with at least 50 mg dose Call if blood sugars are not controlled Follow-up with PCP for right ankle wound, may be in the meantime use topical triple antibiotic and clean with peroxide She will be back in 3 weeks for short-term follow-up and needs repeat renal function studies   Patient Instructions  Stop Invokana and Lisinopril  Toujeo 60 units, take Novolog based on what you plan to eat, at least 10 units  Go to ER now for low BP and black vomitus ? GI bleed   Total visit time for evaluation and management, coordination of care, counseling = 25 minute  April Branch 06/24/2015, 3:29 PM    Note: This office note was prepared with Estate agent. Any transcriptional errors that result from this process are unintentional.

## 2015-06-24 NOTE — ED Notes (Addendum)
Pt arrives from MD office rt coffee ground emesis x2. Pt states she has had a "funny feeling" for about 3 days. Pt initially was 90/40, after sitting for a few minutes pt BP was 128/62 then standing pt BP dropped to 108/48. Pt had her CBG checked at MD office and it was 195 so the pt states she took 15 units of novolog. Upon arrival pt states she feels like her sugar has dropped, CBG checked by staff upon ED arrival and it had dropped to 41. Ellie, PA informed. Pt was given orange juice and peanut butter with graham crackers.

## 2015-06-24 NOTE — H&P (Signed)
History and Physical    April Branch [id number] DOB: 1945/09/03 DOA: 06/24/2015  Referring MD/NP/PA: Marella Chimes, PA-C PCP: April Jewel, MD  Outpatient Specialists:  Patient coming from: PCPs office  Chief Complaint: Low blood sugar, nausea and vomiting.  HPI: April Branch is a 70 y.o. female with below medical history who comes emergency department due to hypoglycemia, nausea and vomiting.   Per patient, she has not been feeling well for the past 3 days. She was at her doctor's office today and had a CBG done, which was 195 mg/dL and she states she took 15 units of NovoLog. Patient subsequently had 2 episodes of emesis and stated that her blood sugar was low. CBG done in the emergency department was 41 mg/dL. Patient was also found to have an initial blood pressure of 90/40 mmHg, which has improved with IV fluids. She denies chest pain, palpitations, dizziness, diaphoresis, PND or orthopnea. She occasionally has pitting edema of the lower extremities. She denies abdominal pain, diarrhea, constipation, melena or hematochezia. However, she states that her appetite has been very poor lately.  ED Course: The patient was given crackers, orange juice and peanut butter. She also received 50 mL of dextrose 50%. She was given IV fluids and her hypotension improved and when seen she stated that her hypoglycemia symptoms were much better. However, it became very concerning, given the patient's history of diabetes and PAD, the fact that the patient has a nonhealing wound on her left medial ankle area from a dog bite that happened several weeks ago. She states that she was given oral amoxicillin, but it did not resolve. She denies fever, chills, but feels tired.   Review of Systems: As per HPI otherwise 10 point review of systems negative.    Past Medical History  Diagnosis Date  . Hyperlipidemia   . Arthritis   . Hypothyroidism   . GERD (gastroesophageal reflux disease)   .  Anxiety   . Diabetes mellitus age 39    insulin dependent  . Peripheral vascular disease (April Branch)   . Lumbar disc disease   . Insulin dependent type 2 diabetes mellitus, controlled (April Branch)   . GERD 07/20/2006  . CAD (coronary artery disease)   . CAD (coronary artery disease) of bypass graft   . Vertigo   . History of transient ischemic attack (TIA)   . Myocardial infarction (April Branch)     AGE 71    . Hypertension   . Depression   . COPD 12/14/2008    PATIENT DENIES    . Personal history of malignant neoplasm of kidney(V10.52) 12/14/2008    Laparoscopic biopsy and cryoablation 7/08 April Branch   . Headache(784.0)   . Neuromuscular disorder ([place])     PERIPHERAL NEUROPATHY  . Anemia   . Diabetic coma ([place]) Feb. 2014  . Fall at home Jan. 2, 2016  Jan. 13, 2016    Left knee  . Anginal pain ([place])     1 MONTH AGO   . Stroke April Branch)     3 MINI STROKES  RT SIDED WEAKNESS  . Fibromyalgia   . HPV (human papilloma virus) infection   . Colon polyps 09/11/2010    Tubular adenoma  . 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 Appendectomy    . Abdominal hysterectomy    . Knee surgery Bilateral     "cartilage"  .  Cholecystectomy    . Lung surgery      lung nodule removed from the right side  . Laparascopic cryoablation of left kidney  08/2006    April Branch for renal cell cancer  . Bladder suspension    . Shoulder surgery Bilateral     'Spurs"  . Foot surgery    . Pr vein bypass graft,aorto-fem-pop  05/27/2010  . Cardiac catheterization    . Femoral-popliteal bypass graft  10/12/2011    Procedure: BYPASS GRAFT FEMORAL-POPLITEAL ARTERY;  Surgeon: April Dutch, MD;  Location: [facility] OR;  Service: Vascular;  Laterality: Left;  Left Femoral-Popliteal Bypass Graft using 102mm x 80cm Propaten Graft with intraop arteriogram times one.  . Intraoperative arteriogram  10/12/2011    Procedure: INTRA OPERATIVE  ARTERIOGRAM;  Surgeon: April Dutch, MD;  Location: [place];  Service: Vascular;  Laterality: Left;  . Left heart catheterization with coronary angiogram N/A 05/11/2011    Procedure: LEFT HEART CATHETERIZATION WITH CORONARY ANGIOGRAM;  Surgeon: Jolaine Artist, MD;  Location: [facility] CATH LAB;  Service: Cardiovascular;  Laterality: N/A;  . Abdominal aortagram N/A 08/21/2011    Procedure: ABDOMINAL Maxcine Ham;  Surgeon: April Dutch, MD;  Location: [facility] CATH LAB;  Service: Cardiovascular;  Laterality: N/A;  . Abdominal aortagram N/A 03/16/2014    Procedure: ABDOMINAL Maxcine Ham;  Surgeon: April Dutch, MD;  Location: [facility] CATH LAB;  Service: Cardiovascular;  Laterality: N/A;  . Femoral-popliteal bypass graft Left 04/17/2014    Procedure: REDO LEFT FEMORAL-POPLITEAL ARTERY BYPASS USING GORE PROPATEN 47mmx80cm GRAFT;  Surgeon: April Dutch, MD;  Location: [place];  Service: Vascular;  Laterality: Left;  . Esophagogastroduodenoscopy (egd) with propofol N/A 05/16/2014    Procedure: ESOPHAGOGASTRODUODENOSCOPY (EGD) WITH PROPOFOL with Balloon dilation;  Surgeon: Milus Banister, MD;  Location: [place];  Service: Endoscopy;  Laterality: N/A;     reports that she quit smoking about 15 months ago. Her smoking use included Cigarettes. She has a 12.5 pack-year smoking history. She has never used smokeless tobacco. She reports that she does not drink alcohol or use illicit drugs.  No Known Allergies  Family History  Problem Relation Age of Onset  . Heart disease Mother   . Lung cancer Mother   . Cancer Mother     Lung  . Heart disease Father   . Lung cancer Father   . Cancer Father     Lung  . Heart disease Sister     CABG- Open Heart  Family history reviewed with the patient.  Prior to Admission medications   Medication Sig Start Date End Date Taking? Authorizing Provider  clopidogrel (PLAVIX) 75 MG tablet Take 75 mg by mouth daily.  04/02/14  Yes Historical Provider, MD  DULoxetine (CYMBALTA)  60 MG capsule TAKE ONE CAPSULE BY MOUTH EVERY DAY 04/01/15  Yes April Snare, MD  ferrous sulfate 325 (65 FE) MG tablet Take 325 mg by mouth 2 (two) times daily with a meal.   Yes Historical Provider, MD  gabapentin (NEURONTIN) 100 MG capsule Take 200 mg by mouth 3 (three) times daily.  01/07/15  Yes Historical Provider, MD  HYDROcodone-acetaminophen (NORCO) 10-325 MG per tablet Take 1 tablet by mouth every 4 (four) hours as needed (pain). 04/20/14  Yes Alvia Grove, PA-C  Insulin Glargine (TOUJEO SOLOSTAR) 300 UNIT/ML SOPN Inject 60 Units into the skin 2 (two) times daily. Patient taking differently: Inject 80 Units into the skin daily.  10/01/14  Yes April Snare, MD  Rebeca Allegra  10 10 MEQ tablet TAKE 1 TABLET BY MOUTH EVERY DAY 04/01/15  Yes April Snare, MD  lansoprazole (PREVACID) 30 MG capsule Take 1 capsule (30 mg total) by mouth 2 (two) times daily before a meal. Patient taking differently: Take 30 mg by mouth daily.  05/16/14  Yes Milus Banister, MD  levothyroxine (SYNTHROID, LEVOTHROID) 137 MCG tablet TAKE 1 TABLET (137 MCG TOTAL) BY MOUTH DAILY BEFORE BREAKFAST. 12/27/14  Yes April Snare, MD  lisinopril (PRINIVIL,ZESTRIL) 5 MG tablet TAKE 1 TABLET (5 MG TOTAL) BY MOUTH DAILY. 04/22/15  Yes April Snare, MD  LORazepam (ATIVAN) 1 MG tablet Take 3 mg by mouth at bedtime.   Yes Historical Provider, MD  metFORMIN (GLUCOPHAGE XR) 750 MG 24 hr tablet take 2 tablets daily 11/30/14  Yes April Snare, MD  nortriptyline (PAMELOR) 75 MG capsule Take 75 mg by mouth at bedtime. 01/24/14  Yes Historical Provider, MD  NOVOLOG FLEXPEN 100 UNIT/ML FlexPen INJECT 10-15 UNITS INTO THE SKIN 3 TIMES A DAY WITH MEALS 01/26/15  Yes Historical Provider, MD  ondansetron (ZOFRAN) 8 MG tablet Take 1 tablet (8 mg total) by mouth every 8 (eight) hours as needed for nausea or vomiting. 11/27/14  Yes Daleen Bo, MD  pravastatin (PRAVACHOL) 20 MG tablet Take 20 mg by mouth daily.   Yes Historical Provider, MD  tolterodine (DETROL LA) 4 MG  24 hr capsule Take 4 mg by mouth daily.    Yes Historical Provider, MD  ACCU-CHEK AVIVA PLUS test strip  01/24/14   Historical Provider, MD  ACCU-CHEK FASTCLIX LANCETS MISC Use 4 per day to check blood sugar dx code E11.9 11/29/14   April Snare, MD  Alcohol Swabs (B-D SINGLE USE SWABS REGULAR) PADS  03/21/13   Historical Provider, MD  amoxicillin-clavulanate (AUGMENTIN) 875-125 MG tablet Take 1 tablet by mouth 2 (two) times daily. Patient not taking: Reported on 06/24/2015 06/14/15   Kalman Drape, PA  canagliflozin (INVOKANA) 100 MG TABS tablet 1 tablet before breakfast Patient not taking: Reported on 06/24/2015 05/24/15   April Snare, MD  cilostazol (PLETAL) 100 MG tablet Take 1 tablet (100 mg total) by mouth 2 (two) times daily before a meal. Patient not taking: Reported on 06/24/2015 05/23/15   Sharmon Leyden Nickel, NP  Insulin Pen Needle (BD PEN NEEDLE NANO U/F) 32G X 4 MM MISC Use to inject insulin 10/30/14   April Snare, MD    Physical Exam: Filed Vitals:   06/24/15 1930 06/24/15 2010 06/24/15 2021 06/24/15 2120  BP: 149/69 139/72 149/69 118/84  Pulse: 94 87 89 93  Temp:    98 F (36.7 C)  TempSrc:    Oral  Resp: 20  21 22   SpO2: 99% 95% 100% 99%      Constitutional: NAD, calm, comfortable Filed Vitals:   06/24/15 1930 06/24/15 2010 06/24/15 2021 06/24/15 2120  BP: 149/69 139/72 149/69 118/84  Pulse: 94 87 89 93  Temp:    98 F (36.7 C)  TempSrc:    Oral  Resp: 20  21 22   SpO2: 99% 95% 100% 99%   Eyes: PERRL, lids and conjunctivae normal ENMT: Mucous membranes are moist. Posterior pharynx clear of any exudate or lesions. Neck: normal, supple, no masses, no thyromegaly Respiratory: clear to auscultation bilaterally, no wheezing, no crackles. Normal respiratory effort. No accessory muscle use.  Cardiovascular: Regular rate and rhythm, no murmurs / rubs / gallops. No extremity edema. 2+ pedal pulses. No carotid bruits.  Abdomen: no tenderness, no masses palpated.  No hepatosplenomegaly.  Bowel sounds positive.  Musculoskeletal: no clubbing / cyanosis. No joint deformity upper and lower extremities. Good ROM, no contractures. Normal muscle tone.  Skin: Positive 2-3 cm tender left medial ankle wound with mildly purulent discharge and surrounding erythema. Neurologic: CN 2-12 grossly intact. Sensation intact, DTR normal. Strength 5/5 in all 4.  Psychiatric: Normal judgment and insight. Alert and oriented x 3. Normal mood.   Labs on Admission: I have personally reviewed following labs and imaging studies  CBC:  Recent Labs Lab 06/24/15 1534  WBC 10.9*  NEUTROABS 6.7  HGB 11.1*  HCT 35.7*  MCV 81.9  PLT 123XX123*   Basic Metabolic Panel:  Recent Labs Lab 06/24/15 1534  NA 141  K 3.6  CL 109  CO2 20*  GLUCOSE 63*  BUN 17  CREATININE 1.71*  CALCIUM 9.1   GFR: Estimated Creatinine Clearance: 30.4 mL/min (by C-G formula based on Cr of 1.71). Liver Function Tests:  Recent Labs Lab 06/24/15 1534  AST 14*  ALT 11*  ALKPHOS 88  BILITOT 0.2*  PROT 7.5  ALBUMIN 3.4*   CBG:  Recent Labs Lab 06/24/15 1535 06/24/15 1610  GLUCAP 55* 179*   Urine analysis:    Component Value Date/Time   COLORURINE YELLOW 04/10/2014 1038   APPEARANCEUR CLEAR 04/10/2014 1038   LABSPEC 1.020 04/10/2014 1038   PHURINE 5.5 04/10/2014 1038   GLUCOSEU 100* 04/10/2014 1038   HGBUR NEGATIVE 04/10/2014 1038   BILIRUBINUR SMALL* 04/10/2014 1038   KETONESUR NEGATIVE 04/10/2014 1038   PROTEINUR NEGATIVE 04/10/2014 1038   UROBILINOGEN 1.0 04/10/2014 1038   NITRITE NEGATIVE 04/10/2014 1038   LEUKOCYTESUR NEGATIVE 04/10/2014 1038     Radiological Exams on Admission: Dg Chest Port 1 View  06/24/2015  CLINICAL DATA:  The patient has felt ill for 3 days. Status post coffee ground emesis x2 today. Hypotension. Initial encounter. EXAM: PORTABLE CHEST 1 VIEW COMPARISON:  PA and lateral chest 04/14/2014 and 09/21/2012. FINDINGS: The lungs are clear. Heart size is mildly enlarged. No  pneumothorax or pleural effusion. No focal bony abnormality. IMPRESSION: No acute disease. Electronically Signed   By: Inge Rise M.D.   On: 06/24/2015 15:59   05/11/2011 echocardiogram  LV EF: 55% -  60%  ------------------------------------------------------------ Indications:   Angina - unstable 411.1. Chest pain 786.51.  ------------------------------------------------------------ History:  PMH:  Coronary artery disease. Chronic obstructive pulmonary disease. PMH:  Myocardial infarction. Risk factors: Current tobacco use. Hypertension. Diabetes mellitus. Dyslipidemia.  ------------------------------------------------------------ Study Conclusions  - Left ventricle: The cavity size was normal. Wall thickness was increased in a pattern of mild LVH. Systolic function was normal. The estimated ejection fraction was in the range of 55% to 60%. Features are consistent with a pseudonormal left ventricular filling pattern, with concomitant abnormal relaxation and increased filling pressure (grade 2 diastolic dysfunction). - Mitral valve: Calcified annulus.  EKG: Independently reviewed.  Vent. rate 83 BPM PR interval 184 ms QRS duration 98 ms QT/QTc 372/437 ms P-R-T axes 63 1 45 Sinus rhythm Abnormal R-wave progression, late transition No significant change since last tracing  Assessment/Plan Principal Problem:   Diabetic foot ulcer ([place]) Admit to telemetry/inpatient. Continue IV antibiotics. Check arterial duplex and ABI of left lower extremity in a.m. May need to consult vascular surgery depending on results.  Active Problems:   Hypothyroidism Continue levothyroxine 137 g by mouth daily. Monitor TSH periodically.    Insulin dependent type 2 diabetes mellitus, controlled (HCC) Continue Lantus 60 units SQ twice a day as prescribed.  The patient uses 80 units twice a day at home, but was hypoglycemic earlier. CBG monitoring with regular  insulin sliding scale.    Essential hypertension Hold lisinopril for now. Monitor blood pressure.    COPD (chronic obstructive pulmonary disease) (HCC) Supplemental oxygen and bronchodilators as needed.    GERD Continue proton pump inhibitor.    CAD (coronary artery disease) I do not have a high suspicion for GI bleed, since H&H is around baseline, so will continue Plavix. Continue pravastatin 20 mg by mouth daily.    Hyperlipidemia Continue pravastatin 20 mg by mouth daily.    Chronic pain syndrome Continue nortriptyline, Neurontin and duloxetine. Continue analgesics as needed.    Hypotension Telemetry monitoring for tonight. Resolving with IV fluids. Monitor blood pressure.   DVT prophylaxis: SCDs. Code Status: Full code. Family Communication: Disposition Plan: IV antibiotic therapy and evaluation of arterial circulation on LLE. Consults called:  Admission status: Admit to telemetry/inpatient.    Reubin Milan MD [name] Pager [phone].  If 7PM-7AM, please contact night-coverage [email or web address] Password [facility]  06/24/2015, 9:37 PM

## 2015-06-24 NOTE — ED Notes (Signed)
Pt reports new onset nausea. Denies emesis.

## 2015-06-24 NOTE — Progress Notes (Signed)
Pharmacy Antibiotic Note  April Branch is a 69 y.o. female admitted on 06/24/2015 with cellulitis.  Pharmacy has been consulted for zosyn dosing.  Plan: Zosyn 3.375g IV q8h (4 hour infusion).     Temp (24hrs), Avg:98.3 F (36.8 C), Min:98.3 F (36.8 C), Max:98.3 F (36.8 C)   Recent Labs Lab 06/24/15 1534  WBC 10.9*  CREATININE 1.71*    Estimated Creatinine Clearance: 30.4 mL/min (by C-G formula based on Cr of 1.71).    No Known Allergies  Levester Fresh, PharmD, BCPS, [facility] Clinical Pharmacist Pager [phone] 06/24/2015 4:56 PM

## 2015-06-24 NOTE — ED Notes (Signed)
X-ray at bedside

## 2015-06-24 NOTE — ED Provider Notes (Signed)
CSN: [id number]     Arrival date & time 06/24/15  1503 History   First MD Initiated Contact with Patient 06/24/15 1504     Chief Complaint  Patient presents with  . GI Bleeding    HPI   April Branch is a 70 y.o. female with a PMH of HLD, HTN, DM, CAD, MI, hypothyroidism who presents to the ED with hypoglycemia, hypotension, and reported hematemesis. She reports her blood sugar was elevated to the high 100s today prior to arrival, so she took 15 units of insulin. She went to her PCP's office for a regular check up, and was noted to be hypotensive to the 0000000 systolic. She reports she had 2 episodes of black emesis this morning and has had diarrhea for the past 2-3 days, and her PCP sent her to the ED for further evaluation and management of this. She denies chest pain, though notes shortness of breath and generalized weakness. She denies abdominal pain, hematochezia, melena, urinary symptoms, numbness, paresthesia.   Past Medical History  Diagnosis Date  . Hyperlipidemia   . Arthritis   . Hypothyroidism   . GERD (gastroesophageal reflux disease)   . Anxiety   . Diabetes mellitus age 48    insulin dependent  . Peripheral vascular disease ([place])   . Lumbar disc disease   . Insulin dependent type 2 diabetes mellitus, controlled ([place])   . GERD 07/20/2006  . CAD (coronary artery disease)   . CAD (coronary artery disease) of bypass graft   . Vertigo   . History of transient ischemic attack (TIA)   . Myocardial infarction ([place])     AGE 30    . Hypertension   . Depression   . COPD 12/14/2008    PATIENT DENIES    . Personal history of malignant neoplasm of kidney(V10.52) 12/14/2008    Laparoscopic biopsy and cryoablation 7/08 Dr. Vernie Shanks   . Headache(784.0)   . Neuromuscular disorder ([place])     PERIPHERAL NEUROPATHY  . Anemia   . Diabetic coma ([place]) Feb. 2014  . Fall at home Jan. 2, 2016  Jan. 13, 2016    Left knee  . Anginal pain ([place])     1 MONTH AGO   . Stroke [facility])     3  MINI STROKES  RT SIDED WEAKNESS  . Fibromyalgia   . HPV (human papilloma virus) infection   . Colon polyps 09/11/2010    Tubular adenoma  . 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 Appendectomy    . Abdominal hysterectomy    . Knee surgery Bilateral     "cartilage"  . Cholecystectomy    . Lung surgery      lung nodule removed from the right side  . Laparascopic cryoablation of left kidney  08/2006    Dr. Gladis Riffle for renal cell cancer  . Bladder suspension    . Shoulder surgery Bilateral     'Spurs"  . Foot surgery    . Pr vein bypass graft,aorto-fem-pop  05/27/2010  . Cardiac catheterization    . Femoral-popliteal bypass graft  10/12/2011    Procedure: BYPASS GRAFT FEMORAL-POPLITEAL ARTERY;  Surgeon: Elam Dutch, MD;  Location: [facility] OR;  Service: Vascular;  Laterality: Left;  Left Femoral-Popliteal Bypass Graft using 30mm x 80cm Propaten Graft with intraop arteriogram times one.  . Intraoperative arteriogram  10/12/2011    Procedure:  INTRA OPERATIVE ARTERIOGRAM;  Surgeon: Elam Dutch, MD;  Location: [place];  Service: Vascular;  Laterality: Left;  . Left heart catheterization with coronary angiogram N/A 05/11/2011    Procedure: LEFT HEART CATHETERIZATION WITH CORONARY ANGIOGRAM;  Surgeon: Jolaine Artist, MD;  Location: [facility] CATH LAB;  Service: Cardiovascular;  Laterality: N/A;  . Abdominal aortagram N/A 08/21/2011    Procedure: ABDOMINAL Maxcine Ham;  Surgeon: Elam Dutch, MD;  Location: [facility] CATH LAB;  Service: Cardiovascular;  Laterality: N/A;  . Abdominal aortagram N/A 03/16/2014    Procedure: ABDOMINAL Maxcine Ham;  Surgeon: Elam Dutch, MD;  Location: [facility] CATH LAB;  Service: Cardiovascular;  Laterality: N/A;  . Femoral-popliteal bypass graft Left 04/17/2014    Procedure: REDO LEFT FEMORAL-POPLITEAL ARTERY BYPASS USING GORE PROPATEN 9mmx80cm GRAFT;  Surgeon: Elam Dutch, MD;  Location:  [place];  Service: Vascular;  Laterality: Left;  . Esophagogastroduodenoscopy (egd) with propofol N/A 05/16/2014    Procedure: ESOPHAGOGASTRODUODENOSCOPY (EGD) WITH PROPOFOL with Balloon dilation;  Surgeon: Milus Banister, MD;  Location: [place];  Service: Endoscopy;  Laterality: N/A;   Family History  Problem Relation Age of Onset  . Heart disease Mother   . Lung cancer Mother   . Cancer Mother     Lung  . Heart disease Father   . Lung cancer Father   . Cancer Father     Lung  . Heart disease Sister     CABG- Open Heart   Social History  Substance Use Topics  . Smoking status: Former Smoker -- 0.25 packs/day for 50 years    Types: Cigarettes    Quit date: 03/17/2014  . Smokeless tobacco: Never Used  . Alcohol Use: No   OB History    No data available      Review of Systems  Constitutional: Negative for fever and chills.  Respiratory: Positive for shortness of breath.   Cardiovascular: Negative for chest pain.  Gastrointestinal: Positive for nausea, vomiting and diarrhea. Negative for abdominal pain, constipation and blood in stool.  Genitourinary: Negative for dysuria, urgency and frequency.  All other systems reviewed and are negative.     Allergies  Review of patient's allergies indicates no known allergies.  Home Medications   Prior to Admission medications   Medication Sig Start Date End Date Taking? Authorizing Provider  clopidogrel (PLAVIX) 75 MG tablet Take 75 mg by mouth daily.  04/02/14  Yes Historical Provider, MD  DULoxetine (CYMBALTA) 60 MG capsule TAKE ONE CAPSULE BY MOUTH EVERY DAY 04/01/15  Yes Elayne Snare, MD  ferrous sulfate 325 (65 FE) MG tablet Take 325 mg by mouth 2 (two) times daily with a meal.   Yes Historical Provider, MD  gabapentin (NEURONTIN) 100 MG capsule Take 200 mg by mouth 3 (three) times daily.  01/07/15  Yes Historical Provider, MD  HYDROcodone-acetaminophen (NORCO) 10-325 MG per tablet Take 1 tablet by mouth every 4 (four) hours as  needed (pain). 04/20/14  Yes Alvia Grove, PA-C  Insulin Glargine (TOUJEO SOLOSTAR) 300 UNIT/ML SOPN Inject 60 Units into the skin 2 (two) times daily. Patient taking differently: Inject 80 Units into the skin daily.  10/01/14  Yes Elayne Snare, MD  KLOR-CON 10 10 MEQ tablet TAKE 1 TABLET BY MOUTH EVERY DAY 04/01/15  Yes Elayne Snare, MD  lansoprazole (PREVACID) 30 MG capsule Take 1 capsule (30 mg total) by mouth 2 (two) times daily before a meal. Patient taking differently: Take 30 mg by mouth daily.  05/16/14  Yes  Milus Banister, MD  levothyroxine (SYNTHROID, LEVOTHROID) 137 MCG tablet TAKE 1 TABLET (137 MCG TOTAL) BY MOUTH DAILY BEFORE BREAKFAST. 12/27/14  Yes Elayne Snare, MD  lisinopril (PRINIVIL,ZESTRIL) 5 MG tablet TAKE 1 TABLET (5 MG TOTAL) BY MOUTH DAILY. 04/22/15  Yes Elayne Snare, MD  LORazepam (ATIVAN) 1 MG tablet Take 3 mg by mouth at bedtime.   Yes Historical Provider, MD  metFORMIN (GLUCOPHAGE XR) 750 MG 24 hr tablet take 2 tablets daily 11/30/14  Yes Elayne Snare, MD  nortriptyline (PAMELOR) 75 MG capsule Take 75 mg by mouth at bedtime. 01/24/14  Yes Historical Provider, MD  NOVOLOG FLEXPEN 100 UNIT/ML FlexPen INJECT 10-15 UNITS INTO THE SKIN 3 TIMES A DAY WITH MEALS 01/26/15  Yes Historical Provider, MD  ondansetron (ZOFRAN) 8 MG tablet Take 1 tablet (8 mg total) by mouth every 8 (eight) hours as needed for nausea or vomiting. 11/27/14  Yes Daleen Bo, MD  pravastatin (PRAVACHOL) 20 MG tablet Take 20 mg by mouth daily.   Yes Historical Provider, MD  tolterodine (DETROL LA) 4 MG 24 hr capsule Take 4 mg by mouth daily.    Yes Historical Provider, MD  ACCU-CHEK AVIVA PLUS test strip  01/24/14   Historical Provider, MD  ACCU-CHEK FASTCLIX LANCETS MISC Use 4 per day to check blood sugar dx code E11.9 11/29/14   Elayne Snare, MD  Alcohol Swabs (B-D SINGLE USE SWABS REGULAR) PADS  03/21/13   Historical Provider, MD  amoxicillin-clavulanate (AUGMENTIN) 875-125 MG tablet Take 1 tablet by mouth 2 (two) times  daily. Patient not taking: Reported on 06/24/2015 06/14/15   Kalman Drape, PA  canagliflozin (INVOKANA) 100 MG TABS tablet 1 tablet before breakfast Patient not taking: Reported on 06/24/2015 05/24/15   Elayne Snare, MD  cilostazol (PLETAL) 100 MG tablet Take 1 tablet (100 mg total) by mouth 2 (two) times daily before a meal. Patient not taking: Reported on 06/24/2015 05/23/15   Sharmon Leyden Nickel, NP  Insulin Pen Needle (BD PEN NEEDLE NANO U/F) 32G X 4 MM MISC Use to inject insulin 10/30/14   Elayne Snare, MD    BP 110/57 mmHg  Pulse 92  Resp 22  SpO2 100% Physical Exam  Constitutional: She is oriented to person, place, and time. She appears well-developed and well-nourished. No distress.  HENT:  Head: Normocephalic and atraumatic.  Right Ear: External ear normal.  Left Ear: External ear normal.  Nose: Nose normal.  Mouth/Throat: Uvula is midline, oropharynx is clear and moist and mucous membranes are normal.  Eyes: Conjunctivae, EOM and lids are normal. Pupils are equal, round, and reactive to light. Right eye exhibits no discharge. Left eye exhibits no discharge. No scleral icterus.  Neck: Normal range of motion. Neck supple.  Cardiovascular: Normal rate, regular rhythm, normal heart sounds, intact distal pulses and normal pulses.   Pulmonary/Chest: Effort normal and breath sounds normal. No respiratory distress. She has no wheezes. She has no rales.  Abdominal: Soft. Normal appearance and bowel sounds are normal. She exhibits no distension and no mass. There is no tenderness. There is no rigidity, no rebound and no guarding.  Genitourinary:  No gross blood or melanotic stool in rectal vault.  Musculoskeletal: Normal range of motion. She exhibits no edema or tenderness.  Neurological: She is alert and oriented to person, place, and time. She has normal strength. No cranial nerve deficit or sensory deficit.  Skin: Skin is warm, dry and intact. No rash noted. She is not diaphoretic. There is  erythema. No pallor.  2.5 cm linear wound to left medial ankle with mild surrounding erythema. No fluctuance.  Psychiatric: She has a normal mood and affect. Her speech is normal and behavior is normal.  Nursing note and vitals reviewed.   ED Course  Procedures (including critical care time)  Labs Review Labs Reviewed  CBC WITH DIFFERENTIAL/PLATELET - Abnormal; Notable for the following:    WBC 10.9 (*)    Hemoglobin 11.1 (*)    HCT 35.7 (*)    MCH 25.5 (*)    Platelets 446 (*)    All other components within normal limits  COMPREHENSIVE METABOLIC PANEL - Abnormal; Notable for the following:    CO2 20 (*)    Glucose, Bld 63 (*)    Creatinine, Ser 1.71 (*)    Albumin 3.4 (*)    AST 14 (*)    ALT 11 (*)    Total Bilirubin 0.2 (*)    GFR calc non Af Amer 30 (*)    GFR calc Af Amer 35 (*)    All other components within normal limits  CBG MONITORING, ED - Abnormal; Notable for the following:    Glucose-Capillary 55 (*)    All other components within normal limits  CBG MONITORING, ED - Abnormal; Notable for the following:    Glucose-Capillary 179 (*)    All other components within normal limits  I-STAT CG4 LACTIC ACID, ED - Abnormal; Notable for the following:    Lactic Acid, Venous 2.58 (*)    All other components within normal limits  I-STAT CG4 LACTIC ACID, ED - Abnormal; Notable for the following:    Lactic Acid, Venous 2.71 (*)    All other components within normal limits  CULTURE, BLOOD (ROUTINE X 2)  CULTURE, BLOOD (ROUTINE X 2)  URINALYSIS, ROUTINE W REFLEX MICROSCOPIC (NOT AT [facility])  POC OCCULT BLOOD, ED  I-STAT TROPOININ, ED  TYPE AND SCREEN    Imaging Review Dg Chest Port 1 View  06/24/2015  CLINICAL DATA:  The patient has felt ill for 3 days. Status post coffee ground emesis x2 today. Hypotension. Initial encounter. EXAM: PORTABLE CHEST 1 VIEW COMPARISON:  PA and lateral chest 04/14/2014 and 09/21/2012. FINDINGS: The lungs are clear. Heart size is mildly  enlarged. No pneumothorax or pleural effusion. No focal bony abnormality. IMPRESSION: No acute disease. Electronically Signed   By: Inge Rise M.D.   On: 06/24/2015 15:59   I have personally reviewed and evaluated these images and lab results as part of my medical decision-making.   EKG Interpretation   Date/Time:  Monday Jun 24 2015 15:39:27 EDT Ventricular Rate:  83 PR Interval:  184 QRS Duration: 98 QT Interval:  372 QTC Calculation: 437 R Axis:   1 Text Interpretation:  Sinus rhythm Abnormal R-wave progression, late  transition No significant change since last tracing Confirmed by  [facility] MD, ERIN ([id number]) on 06/24/2015 5:25:15 PM      MDM   Final diagnoses:  Shortness of breath  Nausea, vomiting, and diarrhea  Hypotension, unspecified hypotension type  Hypoglycemia  Lactic acidosis    70 year old female presents with hypoglycemia, hypotension, and 2 episodes of black emesis today prior to arrival. She was seen by her PCP prior to arrival and noted to be hypotensive, so was sent to the ED for further evaluation and management of her symptoms. On arrival, her blood glucose was 41 and so she was given juice and crackers. She notes vomiting as well as diarrhea  for the past several days. She reports decreased PO intake due to poor appetite. She also states she was recently evaluated for a dog bite to her left ankle. She finished her antibiotic course, though reports continued redness to the area.   Patient is afebrile. BP initially [id number] systolic, at which time the patient was discussed with and seen by Dr. Billy Fischer. She was given fluids and protonix and started on zosyn for empiric antibiotic coverage. Heart RRR. Lungs clear to auscultation bilaterally. Abdomen soft, non-tender, non-distended. No rebound, guarding, or masses. No gross blood or melanotic stool in rectal vault. Wound to medial aspect of left ankle with mild surrounding erythema. No fluctuance. Patient is  NVI.  Hemoccult negative. CBC remarkable for leukocytosis of 10.9, hemoglobin 11.1, which appears stable. CMP remarkable for creatinine 1.71. UA pending. EKG sinus rhythm, HR 83. Troponin negative. CXR negative for acute disease.   CBG 55. Patient given D50 with improvement in CBG to 179. Lactic acid 2.58. Plan to recheck after additional fluids. Repeat lactic acid 2.71. Given increase in lactic acid despite fluid hydration, hospitalist consulted for admission. Spoke with Dr. Olevia Bowens. Patient to be admitted for further evaluation and management.  BP 110/57 mmHg  Pulse 92  Resp 22  SpO2 100%    Marella Chimes, PA-C 06/24/15 2043  Gareth Morgan, MD 06/25/15 [phone]

## 2015-06-25 ENCOUNTER — Encounter (HOSPITAL_COMMUNITY): Payer: Self-pay | Admitting: General Practice

## 2015-06-25 ENCOUNTER — Encounter (HOSPITAL_COMMUNITY): Payer: Medicare Other

## 2015-06-25 ENCOUNTER — Inpatient Hospital Stay (HOSPITAL_COMMUNITY): Payer: Medicare Other

## 2015-06-25 DIAGNOSIS — G894 Chronic pain syndrome: Secondary | ICD-10-CM

## 2015-06-25 DIAGNOSIS — S91052S Open bite, left ankle, sequela: Secondary | ICD-10-CM | POA: Diagnosis not present

## 2015-06-25 DIAGNOSIS — I10 Essential (primary) hypertension: Secondary | ICD-10-CM | POA: Diagnosis not present

## 2015-06-25 DIAGNOSIS — L97509 Non-pressure chronic ulcer of other part of unspecified foot with unspecified severity: Secondary | ICD-10-CM | POA: Diagnosis not present

## 2015-06-25 DIAGNOSIS — E08621 Diabetes mellitus due to underlying condition with foot ulcer: Secondary | ICD-10-CM

## 2015-06-25 DIAGNOSIS — L97529 Non-pressure chronic ulcer of other part of left foot with unspecified severity: Secondary | ICD-10-CM | POA: Diagnosis not present

## 2015-06-25 DIAGNOSIS — J438 Other emphysema: Secondary | ICD-10-CM | POA: Diagnosis not present

## 2015-06-25 LAB — COMPREHENSIVE METABOLIC PANEL
ALT: 8 U/L — AB (ref 14–54)
ANION GAP: 9 (ref 5–15)
AST: 11 U/L — ABNORMAL LOW (ref 15–41)
Albumin: 2.5 g/dL — ABNORMAL LOW (ref 3.5–5.0)
Alkaline Phosphatase: 79 U/L (ref 38–126)
BILIRUBIN TOTAL: 0.2 mg/dL — AB (ref 0.3–1.2)
BUN: 15 mg/dL (ref 6–20)
CALCIUM: 7.9 mg/dL — AB (ref 8.9–10.3)
CO2: 19 mmol/L — AB (ref 22–32)
Chloride: 112 mmol/L — ABNORMAL HIGH (ref 101–111)
Creatinine, Ser: 1.47 mg/dL — ABNORMAL HIGH (ref 0.44–1.00)
GFR, EST AFRICAN AMERICAN: 41 mL/min — AB (ref >60)
GFR, EST NON AFRICAN AMERICAN: 36 mL/min — AB (ref >60)
GLUCOSE: 179 mg/dL — AB (ref 65–99)
POTASSIUM: 4.5 mmol/L (ref 3.5–5.1)
Sodium: 140 mmol/L (ref 135–145)
Total Protein: 5.7 g/dL — ABNORMAL LOW (ref 6.5–8.1)

## 2015-06-25 LAB — CBC WITH DIFFERENTIAL/PLATELET
Basophils Absolute: 0.1 10*3/uL (ref 0.0–0.1)
Basophils Relative: 1 %
EOS PCT: 2 %
Eosinophils Absolute: 0.2 10*3/uL (ref 0.0–0.7)
HEMATOCRIT: 30.1 % — AB (ref 36.0–46.0)
Hemoglobin: 9.4 g/dL — ABNORMAL LOW (ref 12.0–15.0)
LYMPHS PCT: 22 %
Lymphs Abs: 2 10*3/uL (ref 0.7–4.0)
MCH: 25.8 pg — AB (ref 26.0–34.0)
MCHC: 31.2 g/dL (ref 30.0–36.0)
MCV: 82.7 fL (ref 78.0–100.0)
MONOS PCT: 5 %
Monocytes Absolute: 0.5 10*3/uL (ref 0.1–1.0)
NEUTROS ABS: 6.3 10*3/uL (ref 1.7–7.7)
Neutrophils Relative %: 70 %
PLATELETS: 355 10*3/uL (ref 150–400)
RBC: 3.64 MIL/uL — ABNORMAL LOW (ref 3.87–5.11)
RDW: 14.9 % (ref 11.5–15.5)
WBC: 9 10*3/uL (ref 4.0–10.5)

## 2015-06-25 LAB — GLUCOSE, CAPILLARY
GLUCOSE-CAPILLARY: 171 mg/dL — AB (ref 65–99)
Glucose-Capillary: 44 mg/dL — CL (ref 65–99)
Glucose-Capillary: 149 mg/dL — ABNORMAL HIGH (ref 65–99)
Glucose-Capillary: 115 mg/dL — ABNORMAL HIGH (ref 65–99)
Glucose-Capillary: 148 mg/dL — ABNORMAL HIGH (ref 65–99)
Glucose-Capillary: 102 mg/dL — ABNORMAL HIGH (ref 65–99)

## 2015-06-25 MED ORDER — ENSURE ENLIVE PO LIQD: 237.0000 mL | Freq: Two times a day (BID) | ORAL | Status: DC

## 2015-06-25 MED ORDER — DIPHENHYDRAMINE HCL 25 MG PO CAPS
25.0000 mg | ORAL_CAPSULE | Freq: Four times a day (QID) | ORAL | Status: DC | PRN
  Administered 2015-06-25: 25 mg via ORAL
  Filled 2015-06-25: qty 1

## 2015-06-25 MED ORDER — INSULIN GLARGINE 100 UNIT/ML ~~LOC~~ SOLN
60.0000 [IU] | Freq: Two times a day (BID) | SUBCUTANEOUS | Status: DC
  Filled 2015-06-25 (×7): qty 0.6

## 2015-06-25 NOTE — Progress Notes (Signed)
Patient ID: April Branch, female   DOB: Feb 05, 1946, 70 y.o.   MRN: [id number]   PROGRESS NOTE    April Branch  [id number] DOB: 06/09/1945 DOA: 06/24/2015  PCP: [name], MD   Outpatient Specialists:   Brief Narrative:  70 y.o. female with known diabetic neuropathy in both feet, who is s/p left femoral to popliteal bypass graft with propaten on 10/11/11 with subsequent occlusion and redo bypass on 04/17/14, presented to [facility] ED with right upper extremity weakness and numbness that has improved since admission, left ankle wound after dog bite (occorued on June 14, 2015).   Assessment & Plan:   Diabetic foot ulcer ([place]) - continue with IV ABX - cancel ABI until pt seen by her doctor Dr. Oneida Alar - will follow up on recommendations of VS team    Hypothyroidism - Continue levothyroxine 137 g by mouth daily.   Insulin dependent type 2 diabetes mellitus, with complications of neuropathy and PVD, nephropathy  - Continue SSI for now  - monitor CBG    Essential hypertension - reasonably stable for now     CKD stage III - with baseline Cr ~ 1/4 and GFR in 40 - 60's in the past year  - BMP in AM   COPD (chronic obstructive pulmonary disease) (HCC) - Supplemental oxygen and bronchodilators as needed.   GERD - Continue proton pump inhibitor.   CAD (coronary artery disease) - asymptomatic at this time    Hyperlipidemia - Continue pravastatin 20 mg by mouth daily.   Chronic pain syndrome - Continue nortriptyline, Neurontin and duloxetine. - Continue analgesics as needed  DVT prophylaxis: SCD's Code Status: Full  Family Communication: Patient at bedside  Disposition Plan: Home when would improves in healing   Consultants:   VSS  Procedures:   None  Antimicrobials:   Zosyn 5/1 -->   Subjective: Reports feeling better.   Objective: Filed Vitals:   06/24/15 2130 06/24/15 2214 06/25/15 0539 06/25/15 1418  BP: 142/76 161/70 122/56 129/59    Pulse: 88 78 83 87  Temp:  98.7 F (37.1 C) 98.3 F (36.8 C) 98.1 F (36.7 C)  TempSrc:  Oral Oral Oral  Resp: 23 20 20 18   Height:  5\' 1"  (1.549 m)    Weight:  78.654 kg (173 lb 6.4 oz)    SpO2: 99% 100% 97% 98%    Intake/Output Summary (Last 24 hours) at 06/25/15 1643 Last data filed at 06/25/15 1230  Gross per 24 hour  Intake   1363 ml  Output   2100 ml  Net   -737 ml   Filed Weights   06/24/15 2214  Weight: 78.654 kg (173 lb 6.4 oz)    Examination:  General exam: Appears calm and comfortable  Respiratory system: Clear to auscultation. Respiratory effort normal. Cardiovascular system: S1 & S2 heard, RRR. No JVD, rubs, gallops or clicks. No pedal edema. Gastrointestinal system: Abdomen is nondistended, soft and nontender.  Central nervous system: Alert and oriented. No focal neurological deficits. Extremities: Symmetric 5 x 5 power. Very diminished pulses DP and PT bilaterally  Skin: left ankle would, open with small amount of blood noted, TTP with mild surrounding erythema  Psychiatry: Judgement and insight appear normal. Mood & affect appropriate.   Data Reviewed: I have personally reviewed following labs and imaging studies  CBC:  Recent Labs Lab 06/24/15 1534 06/25/15 0343  WBC 10.9* 9.0  NEUTROABS 6.7 6.3  HGB 11.1* 9.4*  HCT 35.7* 30.1*  MCV 81.9 82.7  PLT 446* [id number]   Basic Metabolic Panel:  Recent Labs Lab 06/24/15 1534 06/25/15 0343  NA 141 140  K 3.6 4.5  CL 109 112*  CO2 20* 19*  GLUCOSE 63* 179*  BUN 17 15  CREATININE 1.71* 1.47*  CALCIUM 9.1 7.9*   Liver Function Tests:  Recent Labs Lab 06/24/15 1534 06/25/15 0343  AST 14* 11*  ALT 11* 8*  ALKPHOS 88 79  BILITOT 0.2* 0.2*  PROT 7.5 5.7*  ALBUMIN 3.4* 2.5*   CBG:  Recent Labs Lab 06/24/15 1535 06/24/15 1610 06/24/15 2212 06/25/15 0615 06/25/15 1124  GLUCAP 55* 179* 149* 115* 148*       Component Value Date/Time   COLORURINE STRAW* 06/24/2015 2040   APPEARANCEUR  CLEAR 06/24/2015 2040   LABSPEC 1.024 06/24/2015 2040   PHURINE 5.0 06/24/2015 2040   GLUCOSEU >1000* 06/24/2015 2040   HGBUR SMALL* 06/24/2015 2040   BILIRUBINUR NEGATIVE 06/24/2015 2040   KETONESUR NEGATIVE 06/24/2015 2040   PROTEINUR NEGATIVE 06/24/2015 2040   UROBILINOGEN 1.0 04/10/2014 1038   NITRITE NEGATIVE 06/24/2015 2040   LEUKOCYTESUR NEGATIVE 06/24/2015 2040   Recent Results (from the past 240 hour(s))  Blood culture (routine x 2)     Status: None (Preliminary result)   Collection Time: 06/24/15  4:00 PM  Result Value Ref Range Status   Specimen Description BLOOD RIGHT ANTECUBITAL  Final   Special Requests BOTTLES DRAWN AEROBIC AND ANAEROBIC 5CC  Final   Culture NO GROWTH < 24 HOURS  Final   Report Status PENDING  Incomplete  Blood culture (routine x 2)     Status: None (Preliminary result)   Collection Time: 06/24/15  4:11 PM  Result Value Ref Range Status   Specimen Description BLOOD RIGHT HAND  Final   Special Requests BOTTLES DRAWN AEROBIC ONLY 5CC  Final   Culture NO GROWTH < 24 HOURS  Final   Report Status PENDING  Incomplete      Radiology Studies: Dg Chest Port 1 View  06/24/2015  CLINICAL DATA:  The patient has felt ill for 3 days. Status post coffee ground emesis x2 today. Hypotension. Initial encounter. EXAM: PORTABLE CHEST 1 VIEW COMPARISON:  PA and lateral chest 04/14/2014 and 09/21/2012. FINDINGS: The lungs are clear. Heart size is mildly enlarged. No pneumothorax or pleural effusion. No focal bony abnormality. IMPRESSION: No acute disease. Electronically Signed   By: Inge Rise M.D.   On: 06/24/2015 15:59      Scheduled Meds: . clopidogrel  75 mg Oral Daily  . DULoxetine  60 mg Oral Daily  . ferrous sulfate  325 mg Oral BID WC  . fesoterodine  8 mg Oral Daily  . gabapentin  200 mg Oral TID  . insulin aspart  0-15 Units Subcutaneous TID WC  . levothyroxine  137 mcg Oral QAC breakfast  . nortriptyline  75 mg Oral QHS  . pantoprazole  40 mg  Oral Daily  . piperacillin-tazobactam (ZOSYN)  IV  3.375 g Intravenous Q8H  . pravastatin  20 mg Oral Daily  . sodium chloride flush  3 mL Intravenous Q12H   Continuous Infusions: . 0.9 % NaCl with KCl 20 mEq / L 100 mL/hr at 06/25/15 0955     LOS: 1 day   Time spent: 20 minutes   Faye Ramsay, MD [name] Pager [phone]  If 7PM-7AM, please contact night-coverage [email or web address] Password [facility] 06/25/2015, 4:43 PM

## 2015-06-25 NOTE — Progress Notes (Signed)
Patient c/o itching, states it has been chronic problem and takes Benadryl 25 mg up to 3 times daily at home for itching. Verbal order per Dr. Garwin Brothers, start Benadryl 25 mg PO q 6 hrs PRN for itching.

## 2015-06-25 NOTE — Consult Note (Signed)
Hospital Consult    Reason for Consult:  Non healing wound Referring Physician:  Dr. Olen Pel  MRN #:  [id number]  History of Present Illness: This is a 70 y.o. female who is well known to Dr. Oneida Alar and is s/p left femoral to popliteal bypass graft with propaten on 10/11/11 with subsequent occlusion and redo bypass on 04/17/14.    She returned to [facility] 05/23/15 with worsening calf pain that started about a month prior after she sprained her right calf.  She continues to deny calf pain.  At that time, she did not have any non healing wounds on her feet.   She does have diabetic neuropathy of both feet.  She has hx of TIA's in the past with no residual.     She was admitted to the hospital yesterday after she states she had not been feeling well for several days.  At her doctor's appointment, with increased blood sugars.  She took her Novolog and her blood sugar in the ER was down to 41.  The pt was also found to be hypotensive and was admitted to the hospital.  She states that since she has been in the hospital, she vomited yesterday and it was black.  She tells me she told her doctors about this.   She does have a wound on the medial side of her left ankle where her dog (Pitbull) bit her on June 14, 2015. The dog has since been euthanized. She states she was given Abx.  She states that she was told in the ER to clean it with peroxide and salt water, which she has been doing daily. There is no drainage from the wound.  She denies any fevers.  She is on Zosyn currently.  Blood cultures have no growth.   She is on a statin for cholesterol management.  She is on metformin for diabetes.  She is on Plavix.  She does not take aspirin as it gives her heart burn.  She is on Plavix.    Past Medical History  Diagnosis Date  . Hyperlipidemia   . Arthritis   . Hypothyroidism   . GERD (gastroesophageal reflux disease)   . Anxiety   . Diabetes mellitus age 66    insulin dependent  . Peripheral vascular  disease ([place])   . Lumbar disc disease   . Insulin dependent type 2 diabetes mellitus, controlled ([place])   . GERD 07/20/2006  . CAD (coronary artery disease)   . CAD (coronary artery disease) of bypass graft   . Vertigo   . History of transient ischemic attack (TIA)   . Myocardial infarction ([place])     AGE 66    . Hypertension   . Depression   . COPD 12/14/2008    PATIENT DENIES    . Personal history of malignant neoplasm of kidney(V10.52) 12/14/2008    Laparoscopic biopsy and cryoablation 7/08 Dr. Vernie Shanks   . Headache(784.0)   . Neuromuscular disorder ([place])     PERIPHERAL NEUROPATHY  . Anemia   . Diabetic coma ([place]) Feb. 2014  . Fall at home Jan. 2, 2016  Jan. 13, 2016    Left knee  . Anginal pain ([place])     1 MONTH AGO   . Stroke [facility])     3 MINI STROKES  RT SIDED WEAKNESS  . Fibromyalgia   . HPV (human papilloma virus) infection   . Colon polyps 09/11/2010    Tubular adenoma  . Cancer ([place])  CANCER OF KIDNEY  DR Danise Mina (LEFT), "They froze it."    Past Surgical History  Procedure Laterality Date  . Appendectomy    . Abdominal hysterectomy    . Knee surgery Bilateral     "cartilage"  . Cholecystectomy    . Lung surgery      lung nodule removed from the right side  . Laparascopic cryoablation of left kidney  08/2006    Dr. Gladis Riffle for renal cell cancer  . Bladder suspension    . Shoulder surgery Bilateral     'Spurs"  . Foot surgery    . Pr vein bypass graft,aorto-fem-pop  05/27/2010  . Cardiac catheterization    . Femoral-popliteal bypass graft  10/12/2011    Procedure: BYPASS GRAFT FEMORAL-POPLITEAL ARTERY;  Surgeon: Elam Dutch, MD;  Location: [facility] OR;  Service: Vascular;  Laterality: Left;  Left Femoral-Popliteal Bypass Graft using 61mm x 80cm Propaten Graft with intraop arteriogram times one.  . Intraoperative arteriogram  10/12/2011    Procedure: INTRA OPERATIVE ARTERIOGRAM;  Surgeon: Elam Dutch, MD;  Location: [place];  Service: Vascular;  Laterality:  Left;  . Left heart catheterization with coronary angiogram N/A 05/11/2011    Procedure: LEFT HEART CATHETERIZATION WITH CORONARY ANGIOGRAM;  Surgeon: Jolaine Artist, MD;  Location[facility] CATH LAB;  Service: Cardiovascular;  Laterality: N/A;  . Abdominal aortagram N/A 08/21/2011    Procedure: ABDOMINAL Maxcine Ham;  Surgeon: Elam Dutch, MD;  Location: [facility] CATH LAB;  Service: Cardiovascular;  Laterality: N/A;  . Abdominal aortagram N/A 03/16/2014    Procedure: ABDOMINAL Maxcine Ham;  Surgeon: Elam Dutch, MD;  Location: [facility] CATH LAB;  Service: Cardiovascular;  Laterality: N/A;  . Femoral-popliteal bypass graft Left 04/17/2014    Procedure: REDO LEFT FEMORAL-POPLITEAL ARTERY BYPASS USING GORE PROPATEN 33mmx80cm GRAFT;  Surgeon: Elam Dutch, MD;  Location: [place];  Service: Vascular;  Laterality: Left;  . Esophagogastroduodenoscopy (egd) with propofol N/A 05/16/2014    Procedure: ESOPHAGOGASTRODUODENOSCOPY (EGD) WITH PROPOFOL with Balloon dilation;  Surgeon: Milus Banister, MD;  Location: [place];  Service: Endoscopy;  Laterality: N/A;    No Known Allergies  Prior to Admission medications   Medication Sig Start Date End Date Taking? Authorizing Provider  clopidogrel (PLAVIX) 75 MG tablet Take 75 mg by mouth daily.  04/02/14  Yes Historical Provider, MD  DULoxetine (CYMBALTA) 60 MG capsule TAKE ONE CAPSULE BY MOUTH EVERY DAY 04/01/15  Yes Elayne Snare, MD  ferrous sulfate 325 (65 FE) MG tablet Take 325 mg by mouth 2 (two) times daily with a meal.   Yes Historical Provider, MD  gabapentin (NEURONTIN) 100 MG capsule Take 200 mg by mouth 3 (three) times daily.  01/07/15  Yes Historical Provider, MD  HYDROcodone-acetaminophen (NORCO) 10-325 MG per tablet Take 1 tablet by mouth every 4 (four) hours as needed (pain). 04/20/14  Yes Alvia Grove, PA-C  Insulin Glargine (TOUJEO SOLOSTAR) 300 UNIT/ML SOPN Inject 60 Units into the skin 2 (two) times daily. Patient taking differently: Inject 80 Units  into the skin daily.  10/01/14  Yes Elayne Snare, MD  KLOR-CON 10 10 MEQ tablet TAKE 1 TABLET BY MOUTH EVERY DAY 04/01/15  Yes Elayne Snare, MD  lansoprazole (PREVACID) 30 MG capsule Take 1 capsule (30 mg total) by mouth 2 (two) times daily before a meal. Patient taking differently: Take 30 mg by mouth daily.  05/16/14  Yes Milus Banister, MD  levothyroxine (SYNTHROID, LEVOTHROID) 137 MCG tablet TAKE 1 TABLET (137 MCG TOTAL)  BY MOUTH DAILY BEFORE BREAKFAST. 12/27/14  Yes Elayne Snare, MD  lisinopril (PRINIVIL,ZESTRIL) 5 MG tablet TAKE 1 TABLET (5 MG TOTAL) BY MOUTH DAILY. 04/22/15  Yes Elayne Snare, MD  LORazepam (ATIVAN) 1 MG tablet Take 3 mg by mouth at bedtime.   Yes Historical Provider, MD  metFORMIN (GLUCOPHAGE XR) 750 MG 24 hr tablet take 2 tablets daily 11/30/14  Yes Elayne Snare, MD  nortriptyline (PAMELOR) 75 MG capsule Take 75 mg by mouth at bedtime. 01/24/14  Yes Historical Provider, MD  NOVOLOG FLEXPEN 100 UNIT/ML FlexPen INJECT 10-15 UNITS INTO THE SKIN 3 TIMES A DAY WITH MEALS 01/26/15  Yes Historical Provider, MD  ondansetron (ZOFRAN) 8 MG tablet Take 1 tablet (8 mg total) by mouth every 8 (eight) hours as needed for nausea or vomiting. 11/27/14  Yes Daleen Bo, MD  pravastatin (PRAVACHOL) 20 MG tablet Take 20 mg by mouth daily.   Yes Historical Provider, MD  tolterodine (DETROL LA) 4 MG 24 hr capsule Take 4 mg by mouth daily.    Yes Historical Provider, MD  ACCU-CHEK AVIVA PLUS test strip  01/24/14   Historical Provider, MD  ACCU-CHEK FASTCLIX LANCETS MISC Use 4 per day to check blood sugar dx code E11.9 11/29/14   Elayne Snare, MD  Alcohol Swabs (B-D SINGLE USE SWABS REGULAR) PADS  03/21/13   Historical Provider, MD  amoxicillin-clavulanate (AUGMENTIN) 875-125 MG tablet Take 1 tablet by mouth 2 (two) times daily. Patient not taking: Reported on 06/24/2015 06/14/15   Kalman Drape, PA  canagliflozin (INVOKANA) 100 MG TABS tablet 1 tablet before breakfast Patient not taking: Reported on 06/24/2015 05/24/15    Elayne Snare, MD  cilostazol (PLETAL) 100 MG tablet Take 1 tablet (100 mg total) by mouth 2 (two) times daily before a meal. Patient not taking: Reported on 06/24/2015 05/23/15   Sharmon Leyden Nickel, NP  Insulin Pen Needle (BD PEN NEEDLE NANO U/F) 32G X 4 MM MISC Use to inject insulin 10/30/14   Elayne Snare, MD    Social History   Social History  . Marital Status: Widowed    Spouse Name: N/A  . Number of Children: 2  . Years of Education: N/A   Occupational History  .     Social History Main Topics  . Smoking status: Former Smoker -- 0.25 packs/day for 50 years    Types: Cigarettes    Quit date: 03/17/2014  . Smokeless tobacco: Never Used  . Alcohol Use: No  . Drug Use: No  . Sexual Activity: Not on file   Other Topics Concern  . Not on file   Social History Narrative   Widow. Two sons. Husband died of lung cancer.                Family History  Problem Relation Age of Onset  . Heart disease Mother   . Lung cancer Mother   . Cancer Mother     Lung  . Heart disease Father   . Lung cancer Father   . Cancer Father     Lung  . Heart disease Sister     CABG- Open Heart    ROS: [x]  Positive   [ ]  Negative   [ ]  All sytems reviewed and are negative  Cardiovascular: []  chest pain/pressure []  palpitations [x]  hx CAD with CABG []  SOB lying flat []  DOE [x]  pain in legs while walking []  pain in legs at rest []  pain in legs at night []  non-healing ulcers []  hx  of DVT []  swelling in legs  Pulmonary: []  productive cough []  asthma/wheezing []  home O2 [x]  COPD  Neurologic: []  weakness in []  arms []  legs []  numbness in []  arms []  legs []  hx of CVA [x]  hx mini stroke [] difficulty speaking or slurred speech []  temporary loss of vision in one eye []  dizziness  Hematologic: [x]  hx of cancer []  bleeding problems []  problems with blood clotting easily  Endocrine:   [x]  diabetes [x]  peripheral neuropathy [ x] thyroid disease  GI [x]  vomiting black in color []   blood in stool [x]  hx colon polyps  GU: []  CKD/renal failure []  HD--[]  M/W/F or []  T/T/S []  burning with urination []  blood in urine  Psychiatric: []  anxiety [x]  depression  Musculoskeletal: []  arthritis []  joint pain  Integumentary: []  rashes []  ulcers  Constitutional: []  fever []  chills   Physical Examination  Filed Vitals:   06/24/15 2214 06/25/15 0539  BP: 161/70 122/56  Pulse: 78 83  Temp: 98.7 F (37.1 C) 98.3 F (36.8 C)  Resp: 20 20   Body mass index is 32.78 kg/(m^2).  General:  WDWN in NAD Gait: Not observed HENT: WNL, normocephalic Pulmonary: normal non-labored breathing, without Rales, rhonchi,  wheezing Cardiac: regular, without  Murmurs, rubs or gallops; without carotid bruits Abdomen: obesew soft, NT/ND, no masses Skin: without rashes Vascular Exam/Pulses:  Right Left  Femoral 2+ (normal) 2+ (normal)  Peroneal monophasic faint  DP monophasic brisk  PT monophasic monophasic   Extremities: ~ 3cm long wound on the left medial ankle; there is no drainage present and does not appear infected.  There was scabbed areas on the lateral left leg of healing dog bites.  Musculoskeletal: no muscle wasting or atrophy  Neurologic: A&O X 3; Appropriate Affect ; SENSATION: normal; MOTOR FUNCTION:  moving all extremities equally. Speech is fluent/normal Psychiatric:  normal Lymph:  No inguinal lymphadenopathy   CBC    Component Value Date/Time   WBC 9.0 06/25/2015 0343   RBC 3.64* 06/25/2015 0343   HGB 9.4* 06/25/2015 0343   HCT 30.1* 06/25/2015 0343   PLT 355 06/25/2015 0343   MCV 82.7 06/25/2015 0343   MCH 25.8* 06/25/2015 0343   MCHC 31.2 06/25/2015 0343   RDW 14.9 06/25/2015 0343   LYMPHSABS 2.0 06/25/2015 0343   MONOABS 0.5 06/25/2015 0343   EOSABS 0.2 06/25/2015 0343   BASOSABS 0.1 06/25/2015 0343    BMET    Component Value Date/Time   NA 140 06/25/2015 0343   K 4.5 06/25/2015 0343   CL 112* 06/25/2015 0343   CO2 19* 06/25/2015 0343     GLUCOSE 179* 06/25/2015 0343   BUN 15 06/25/2015 0343   CREATININE 1.47* 06/25/2015 0343   CALCIUM 7.9* 06/25/2015 0343   GFRNONAA 36* 06/25/2015 0343   GFRAA 41* 06/25/2015 0343    COAGS: Lab Results  Component Value Date   INR 1.06 04/22/2014   INR 0.99 04/10/2014   INR 1.07 10/05/2011     Non-Invasive Vascular Imaging:   none  Statin:  Yes.   Beta Blocker:  No. Aspirin:  No. ACEI:  Yes.   ARB:  No. Other antiplatelets/anticoagulants:  Yes.   Plavix   ASSESSMENT/PLAN: This is a 69 y.o. female with hx of left leg bypass grafting x 2 now with 22 day old dog bite at medial left ankle    -pt's wound is clean without drainage.  Her bypass is occluded, however, she does have doppler flow in the left DP and monophasic  flow in the left PT and faint flow in the peroneal. -will cancel ABI test that were ordered. -for wound care, wash with soap and water daily and dry dressing with kerlix daily.  -she will f/u with Dr. Oneida Alar in 2 weeks for wound check.   Leontine Locket, PA-C [facility] [phone]  History and exam details as above.  Pt with 2 prior left fem pop bypass grafts.  First in 2013 lasted 3 years.  2nd in 2016 lasted 1 year.  Her graft has been know to be occluded for about 2 months.  She was bitten by a dog on the left ankle on 06/13/15.  Several abrasions on the lateral leg are slowly healing.  There is a 4 cm laceration adjacent to the medial malleolus that has not completely healed but seems to be in the process of healing.  No erythema fluctuance of evidence of infection  Would recommend soap and water washing of wound once daily then cover with clean dry dressing.  She can follow up with me in 2 weeks to reassess wound.  If it has not progressed toward healing at that time will consider agram  She does have some short distance claudication as well but I would not consider a 3rd time redo bypass of limited durability for claudication alone  and this was discussed with the patient.  ABI in March was 0.5-0.6 bilaterally.  Would not repeat at this time  Ruta Hinds, MD [facility] [place] Office: [phone] Pager: [phone]

## 2015-06-26 DIAGNOSIS — E08621 Diabetes mellitus due to underlying condition with foot ulcer: Secondary | ICD-10-CM | POA: Diagnosis not present

## 2015-06-26 DIAGNOSIS — J438 Other emphysema: Secondary | ICD-10-CM

## 2015-06-26 DIAGNOSIS — L97529 Non-pressure chronic ulcer of other part of left foot with unspecified severity: Secondary | ICD-10-CM | POA: Diagnosis not present

## 2015-06-26 DIAGNOSIS — G894 Chronic pain syndrome: Secondary | ICD-10-CM | POA: Diagnosis not present

## 2015-06-26 LAB — COMPREHENSIVE METABOLIC PANEL
ALBUMIN: 2.7 g/dL — AB (ref 3.5–5.0)
ALK PHOS: 87 U/L (ref 38–126)
ALT: 10 U/L — AB (ref 14–54)
AST: 13 U/L — AB (ref 15–41)
Anion gap: 8 (ref 5–15)
BILIRUBIN TOTAL: 0.3 mg/dL (ref 0.3–1.2)
BUN: 12 mg/dL (ref 6–20)
CALCIUM: 8.5 mg/dL — AB (ref 8.9–10.3)
CO2: 21 mmol/L — ABNORMAL LOW (ref 22–32)
CREATININE: 1.41 mg/dL — AB (ref 0.44–1.00)
Chloride: 109 mmol/L (ref 101–111)
GFR calc Af Amer: 44 mL/min — ABNORMAL LOW (ref >60)
GFR calc non Af Amer: 38 mL/min — ABNORMAL LOW (ref >60)
GLUCOSE: 152 mg/dL — AB (ref 65–99)
POTASSIUM: 4.9 mmol/L (ref 3.5–5.1)
Sodium: 138 mmol/L (ref 135–145)
TOTAL PROTEIN: 6.4 g/dL — AB (ref 6.5–8.1)

## 2015-06-26 LAB — CBC WITH DIFFERENTIAL/PLATELET
BASOS ABS: 0.1 10*3/uL (ref 0.0–0.1)
Basophils Relative: 1 %
Eosinophils Absolute: 0.3 10*3/uL (ref 0.0–0.7)
Eosinophils Relative: 4 %
HEMATOCRIT: 33 % — AB (ref 36.0–46.0)
HEMOGLOBIN: 10.4 g/dL — AB (ref 12.0–15.0)
LYMPHS PCT: 24 %
Lymphs Abs: 1.9 10*3/uL (ref 0.7–4.0)
MCH: 26.6 pg (ref 26.0–34.0)
MCHC: 31.5 g/dL (ref 30.0–36.0)
MCV: 84.4 fL (ref 78.0–100.0)
MONO ABS: 0.4 10*3/uL (ref 0.1–1.0)
Monocytes Relative: 6 %
Neutro Abs: 5.2 10*3/uL (ref 1.7–7.7)
Neutrophils Relative %: 65 %
PLATELETS: 410 10*3/uL — AB (ref 150–400)
RBC: 3.91 MIL/uL (ref 3.87–5.11)
RDW: 15.1 % (ref 11.5–15.5)
WBC: 7.9 10*3/uL (ref 4.0–10.5)

## 2015-06-26 LAB — GLUCOSE, CAPILLARY
GLUCOSE-CAPILLARY: 124 mg/dL — AB (ref 65–99)
GLUCOSE-CAPILLARY: 166 mg/dL — AB (ref 65–99)
Glucose-Capillary: 77 mg/dL (ref 65–99)
Glucose-Capillary: 121 mg/dL — ABNORMAL HIGH (ref 65–99)

## 2015-06-26 MED ORDER — SODIUM CHLORIDE 0.9% FLUSH: 10.0000 mL | Freq: Two times a day (BID) | INTRAVENOUS | Status: DC

## 2015-06-26 MED ORDER — FLUCONAZOLE 100 MG PO TABS
100.0000 mg | ORAL_TABLET | Freq: Every day | ORAL | Status: DC
  Administered 2015-06-26 – 2015-06-28 (×3): 100 mg via ORAL
  Filled 2015-06-26 (×3): qty 1

## 2015-06-26 MED ORDER — SODIUM CHLORIDE 0.9% FLUSH
10.0000 mL | INTRAVENOUS | Status: DC | PRN
  Administered 2015-06-27 – 2015-06-28 (×2): 10 mL
  Filled 2015-06-26 (×2): qty 40

## 2015-06-26 NOTE — Progress Notes (Signed)
Patient ID: April Branch, female   DOB: 11-01-1945, 70 y.o.   MRN: [id number]   PROGRESS NOTE    April Branch  [id number] DOB: 1945/05/30 DOA: 06/24/2015  PCP: [name], MD   Outpatient Specialists:   Brief Narrative:  70 y.o. female with known diabetic neuropathy in both feet, who is s/p left femoral to popliteal bypass graft with propaten on 10/11/11 with subsequent occlusion and redo bypass on 04/17/14, presented to [facility] ED with right upper extremity weakness and numbness that has improved since admission, left ankle wound after dog bite (occorued on June 14, 2015).   Assessment & Plan:   Diabetic foot ulcer ([place]) - continue with IV ABX, Zosyn day #3 - appreciate vascular surgery team assistance  - pt still with low grade fevers up to 99.1 F - wound care consult placed, wound cultures requested as well   Hypothyroidism - Continue levothyroxine 137 g by mouth daily.   Insulin dependent type 2 diabetes mellitus, with complications of neuropathy and PVD, nephropathy  - Continue SSI for now  - CBG's in 150 - 170's this AM     Thrombocytosis - reactive - CBC In AM   Essential hypertension - reasonably stable for now     CKD stage III - with baseline Cr ~ 1/4 and GFR in 40 - 60's in the past year  - Cr at baseline  - BMP in AM   COPD (chronic obstructive pulmonary disease) (HCC) - Supplemental oxygen and bronchodilators as needed.   GERD - Continue proton pump inhibitor.   CAD (coronary artery disease) - asymptomatic at this time    Hyperlipidemia - Continue pravastatin 20 mg by mouth daily.   Chronic pain syndrome - Continue nortriptyline, Neurontin and duloxetine. - Continue analgesics as needed    Anemia of CKD and DM - no sings of bleeding  - CBC In AM  DVT prophylaxis: SCD's Code Status: Full  Family Communication: Patient at bedside  Disposition Plan: Home when off IV ABX, possibly by 5/4 or 5/5  Consultants:    VSS  Procedures:   None  Antimicrobials:   Zosyn 5/1 -->   Subjective: Reports feeling better.   Objective: Filed Vitals:   06/25/15 0539 06/25/15 1418 06/25/15 2122 06/26/15 0247  BP: 122/56 129/59 143/62 165/62  Pulse: 83 87 82 92  Temp: 98.3 F (36.8 C) 98.1 F (36.7 C) 98.8 F (37.1 C) 99.1 F (37.3 C)  TempSrc: Oral Oral Oral Oral  Resp: 20 18 18 18   Height:      Weight:      SpO2: 97% 98% 98% 95%    Intake/Output Summary (Last 24 hours) at 06/26/15 1127 Last data filed at 06/26/15 0248  Gross per 24 hour  Intake    600 ml  Output   2650 ml  Net  -2050 ml   Filed Weights   06/24/15 2214  Weight: 78.654 kg (173 lb 6.4 oz)    Examination:  General exam: Appears calm and comfortable  Respiratory system: Clear to auscultation. Respiratory effort normal. Cardiovascular system: S1 & S2 heard, RRR. No JVD, rubs, gallops or clicks. No pedal edema. Gastrointestinal system: Abdomen is nondistended, soft and nontender.  Central nervous system: Alert and oriented. No focal neurological deficits. Extremities: Symmetric 5 x 5 power. Very diminished pulses DP and PT bilaterally  Skin: left ankle would, open with small amount of blood noted, TTP with mild surrounding erythema, left foot swollen  Psychiatry: Judgement and insight appear normal.  Mood & affect appropriate.   Data Reviewed: I have personally reviewed following labs and imaging studies  CBC:  Recent Labs Lab 06/24/15 1534 06/25/15 0343 06/26/15 0248  WBC 10.9* 9.0 7.9  NEUTROABS 6.7 6.3 5.2  HGB 11.1* 9.4* 10.4*  HCT 35.7* 30.1* 33.0*  MCV 81.9 82.7 84.4  PLT 446* 355 123XX123*   Basic Metabolic Panel:  Recent Labs Lab 06/24/15 1534 06/25/15 0343 06/26/15 0248  NA 141 140 138  K 3.6 4.5 4.9  CL 109 112* 109  CO2 20* 19* 21*  GLUCOSE 63* 179* 152*  BUN 17 15 12   CREATININE 1.71* 1.47* 1.41*  CALCIUM 9.1 7.9* 8.5*   Liver Function Tests:  Recent Labs Lab 06/24/15 1534  06/25/15 0343 06/26/15 0248  AST 14* 11* 13*  ALT 11* 8* 10*  ALKPHOS 88 79 87  BILITOT 0.2* 0.2* 0.3  PROT 7.5 5.7* 6.4*  ALBUMIN 3.4* 2.5* 2.7*   CBG:  Recent Labs Lab 06/25/15 1124 06/25/15 1625 06/25/15 2112 06/26/15 0626 06/26/15 1100  GLUCAP 148* 102* 171* 124* 166*       Component Value Date/Time   COLORURINE STRAW* 06/24/2015 2040   APPEARANCEUR CLEAR 06/24/2015 2040   LABSPEC 1.024 06/24/2015 2040   PHURINE 5.0 06/24/2015 2040   GLUCOSEU >1000* 06/24/2015 2040   HGBUR SMALL* 06/24/2015 2040   BILIRUBINUR NEGATIVE 06/24/2015 2040   KETONESUR NEGATIVE 06/24/2015 2040   PROTEINUR NEGATIVE 06/24/2015 2040   UROBILINOGEN 1.0 04/10/2014 1038   NITRITE NEGATIVE 06/24/2015 2040   LEUKOCYTESUR NEGATIVE 06/24/2015 2040   Recent Results (from the past 240 hour(s))  Blood culture (routine x 2)     Status: None (Preliminary result)   Collection Time: 06/24/15  4:00 PM  Result Value Ref Range Status   Specimen Description BLOOD RIGHT ANTECUBITAL  Final   Special Requests BOTTLES DRAWN AEROBIC AND ANAEROBIC 5CC  Final   Culture NO GROWTH < 24 HOURS  Final   Report Status PENDING  Incomplete  Blood culture (routine x 2)     Status: None (Preliminary result)   Collection Time: 06/24/15  4:11 PM  Result Value Ref Range Status   Specimen Description BLOOD RIGHT HAND  Final   Special Requests BOTTLES DRAWN AEROBIC ONLY 5CC  Final   Culture NO GROWTH < 24 HOURS  Final   Report Status PENDING  Incomplete      Radiology Studies: Mr Brain Wo Contrast  06/25/2015  CLINICAL DATA:  Right arm weakness and numbness. EXAM: MRI HEAD WITHOUT CONTRAST TECHNIQUE: Multiplanar, multiecho pulse sequences of the brain and surrounding structures were obtained without intravenous contrast. COMPARISON:  03/15/2012 FINDINGS: There is no evidence of acute infarct, intracranial hemorrhage, mass, midline shift, or extra-axial fluid collection. Ventricles and sulci are within normal limits for  age. Patchy T2 hyperintensities in the subcortical and periventricular cerebral white matter bilaterally have slightly progressed from the prior MRI and are nonspecific but compatible with moderate chronic small vessel ischemic disease. Orbits are unremarkable. Postsurgical changes are present in the paranasal sinuses on the right with chronic right nasal cavity/ethmoid and mucosal thickening. The mastoid air cells are clear. Major intracranial vascular flow voids are preserved. IMPRESSION: 1. No acute intracranial abnormality. 2. Moderate chronic small vessel ischemic disease, slightly progressed from 2014. Electronically Signed   By: Logan Bores M.D.   On: 06/25/2015 21:06   Dg Chest Port 1 View  06/24/2015  CLINICAL DATA:  The patient has felt ill for 3 days. Status post coffee ground  emesis x2 today. Hypotension. Initial encounter. EXAM: PORTABLE CHEST 1 VIEW COMPARISON:  PA and lateral chest 04/14/2014 and 09/21/2012. FINDINGS: The lungs are clear. Heart size is mildly enlarged. No pneumothorax or pleural effusion. No focal bony abnormality. IMPRESSION: No acute disease. Electronically Signed   By: Inge Rise M.D.   On: 06/24/2015 15:59      Scheduled Meds: . clopidogrel  75 mg Oral Daily  . DULoxetine  60 mg Oral Daily  . feeding supplement (ENSURE ENLIVE)  237 mL Oral BID BM  . ferrous sulfate  325 mg Oral BID WC  . fesoterodine  8 mg Oral Daily  . fluconazole  100 mg Oral Daily  . gabapentin  200 mg Oral TID  . insulin aspart  0-15 Units Subcutaneous TID WC  . insulin glargine  60 Units Subcutaneous BID  . levothyroxine  137 mcg Oral QAC breakfast  . nortriptyline  75 mg Oral QHS  . pantoprazole  40 mg Oral Daily  . piperacillin-tazobactam (ZOSYN)  IV  3.375 g Intravenous Q8H  . pravastatin  20 mg Oral Daily  . sodium chloride flush  3 mL Intravenous Q12H   Continuous Infusions: . 0.9 % NaCl with KCl 20 mEq / L 100 mL/hr at 06/26/15 1119     LOS: 2 days   Time spent: 20  minutes   Faye Ramsay, MD [name] Pager [phone]  If 7PM-7AM, please contact night-coverage [email or web address] Password [email or web address] 06/26/2015, 11:27 AM

## 2015-06-26 NOTE — Clinical Documentation Improvement (Signed)
Hospitalist  Please do not document query responses on the CDI BPA form in [facility] Please document query responses in the progress notes and discharge summary.   Thank you.  There is conflicting documentation regarding the LATERALITY of the patient's dog bite wound.  Please clarify if the dog bite wound is on the LEFT or RIGHT ankle  Clinical Information: "laceration to medial side of right ankle healing with surrounding erythema, no fluctuance" documented by the ED provider "She also states she was recently evaluated for a dog bite to her left ankle. She finished her antibiotic course...Marland KitchenMarland Kitchen" documented by ED provider "However, it became very concerning, given the patient's history of diabetes and PAD, the fact that the patient has a nonhealing wound on her left medial ankle area from a dog bite that happened several weeks ago" is documented in the H&P  Please exercise your independent, professional judgment when responding. A specific answer is not anticipated or expected.   Thank You, Erling Conte  RN BSN CCDS [phone] Health Information Management [place]

## 2015-06-26 NOTE — Consult Note (Addendum)
WOC consult requested for ankle from a previous dog bite wound.  This location was already assessed by the vascular team and they have provided topical treatment orders for the bedside nurses to follow. She has a decreased ABI which may impair healing, according to the EMR. They plan to followup with the patient as an outpatient in 2 weeks after discharge, according to the progress notes.  Please refer to their team for further questions. Please re-consult if further assistance is needed.  Thank-you,  Julien Girt MSN, [place], [place], [place], [place]

## 2015-06-26 NOTE — Progress Notes (Signed)
Nutrition Brief Note  Patient identified on the Malnutrition Screening Tool (MST) Report  Wt Readings from Last 15 Encounters:  06/24/15 173 lb 6.4 oz (78.654 kg)  06/24/15 167 lb (75.751 kg)  05/24/15 172 lb 9.6 oz (78.291 kg)  05/23/15 171 lb 6.4 oz (77.747 kg)  02/21/15 178 lb 6.4 oz (80.922 kg)  01/10/15 171 lb 9.6 oz (77.837 kg)  11/29/14 171 lb (77.565 kg)  11/27/14 169 lb (76.658 kg)  11/22/14 168 lb (76.204 kg)  10/30/14 163 lb (73.936 kg)  10/01/14 153 lb (69.4 kg)  08/16/14 153 lb (69.4 kg)  05/16/14 150 lb (68.04 kg)  05/09/14 150 lb (68.04 kg)  05/09/14 159 lb 9.6 oz (72.394 kg)    Body mass index is 32.78 kg/(m^2). Patient meets criteria for Obesity Class I based on current BMI.   Current diet order is Heart Healthy/Carbohydrate Modified, patient is consuming approximately 90% of meals at this time. Labs and medications reviewed.   No nutrition interventions warranted at this time. If nutrition issues arise, please consult RD.   Arthur Holms, RD, LDN Pager #: [phone] After-Hours Pager #: [phone]

## 2015-06-27 ENCOUNTER — Telehealth: Payer: Self-pay | Admitting: Vascular Surgery

## 2015-06-27 DIAGNOSIS — L97509 Non-pressure chronic ulcer of other part of unspecified foot with unspecified severity: Secondary | ICD-10-CM | POA: Diagnosis not present

## 2015-06-27 DIAGNOSIS — J438 Other emphysema: Secondary | ICD-10-CM | POA: Diagnosis not present

## 2015-06-27 DIAGNOSIS — E08621 Diabetes mellitus due to underlying condition with foot ulcer: Secondary | ICD-10-CM | POA: Diagnosis not present

## 2015-06-27 DIAGNOSIS — I10 Essential (primary) hypertension: Secondary | ICD-10-CM

## 2015-06-27 LAB — CBC WITH DIFFERENTIAL/PLATELET
Basophils Absolute: 0.1 10*3/uL (ref 0.0–0.1)
Basophils Relative: 1 %
EOS PCT: 4 %
Eosinophils Absolute: 0.3 10*3/uL (ref 0.0–0.7)
HEMATOCRIT: 33.1 % — AB (ref 36.0–46.0)
Hemoglobin: 10.3 g/dL — ABNORMAL LOW (ref 12.0–15.0)
LYMPHS ABS: 2 10*3/uL (ref 0.7–4.0)
LYMPHS PCT: 27 %
MCH: 26.3 pg (ref 26.0–34.0)
MCHC: 31.1 g/dL (ref 30.0–36.0)
MCV: 84.4 fL (ref 78.0–100.0)
MONO ABS: 0.5 10*3/uL (ref 0.1–1.0)
Monocytes Relative: 7 %
Neutro Abs: 4.6 10*3/uL (ref 1.7–7.7)
Neutrophils Relative %: 61 %
PLATELETS: 343 10*3/uL (ref 150–400)
RBC: 3.92 MIL/uL (ref 3.87–5.11)
RDW: 14.9 % (ref 11.5–15.5)
WBC: 7.5 10*3/uL (ref 4.0–10.5)

## 2015-06-27 LAB — GLUCOSE, CAPILLARY
GLUCOSE-CAPILLARY: 85 mg/dL (ref 65–99)
GLUCOSE-CAPILLARY: 232 mg/dL — AB (ref 65–99)
GLUCOSE-CAPILLARY: 160 mg/dL — AB (ref 65–99)
GLUCOSE-CAPILLARY: 168 mg/dL — AB (ref 65–99)

## 2015-06-27 LAB — COMPREHENSIVE METABOLIC PANEL
ALBUMIN: 2.7 g/dL — AB (ref 3.5–5.0)
ALT: 8 U/L — ABNORMAL LOW (ref 14–54)
AST: 14 U/L — AB (ref 15–41)
Alkaline Phosphatase: 77 U/L (ref 38–126)
Anion gap: 9 (ref 5–15)
BUN: 11 mg/dL (ref 6–20)
CHLORIDE: 107 mmol/L (ref 101–111)
CO2: 26 mmol/L (ref 22–32)
Calcium: 8.9 mg/dL (ref 8.9–10.3)
Creatinine, Ser: 1.22 mg/dL — ABNORMAL HIGH (ref 0.44–1.00)
GFR calc Af Amer: 52 mL/min — ABNORMAL LOW (ref >60)
GFR calc non Af Amer: 45 mL/min — ABNORMAL LOW (ref >60)
GLUCOSE: 97 mg/dL (ref 65–99)
POTASSIUM: 4.7 mmol/L (ref 3.5–5.1)
Sodium: 142 mmol/L (ref 135–145)
Total Bilirubin: 0.4 mg/dL (ref 0.3–1.2)
Total Protein: 6.6 g/dL (ref 6.5–8.1)

## 2015-06-27 MED ORDER — AMOXICILLIN-POT CLAVULANATE 500-125 MG PO TABS
1.0000 | ORAL_TABLET | Freq: Three times a day (TID) | ORAL | Status: DC
  Administered 2015-06-27 – 2015-06-28 (×3): 500 mg via ORAL
  Filled 2015-06-27 (×5): qty 1

## 2015-06-27 NOTE — Progress Notes (Signed)
Patient ID: April Branch, female   DOB: 01-16-1946, 70 y.o.   MRN: [id number]   PROGRESS NOTE    PETER LIVERMAN  [id number] DOB: September 05, 1945 DOA: 06/24/2015  PCP: [name], MD   Outpatient Specialists:   Brief Narrative:  70 y.o. female with known diabetic neuropathy in both feet, who is s/p left femoral to popliteal bypass graft with propaten on 10/11/11 with subsequent occlusion and redo bypass on 04/17/14, presented to [facility] ED with right upper extremity weakness and numbness that has improved since admission, left ankle wound after dog bite (occorued on June 14, 2015).   Assessment & Plan:   Diabetic foot ulcer, wound post dog bite, left medial ankle ([place]) - still with some blood draining but overall better - will stop Zosyn and will place on Augmenting to complete therapy  - today is day #4/7 of ABX  - Wash with soap and water daily with dry dressing - if the wound does not progress in healing, Dr. Oneida Alar to consider ? angram    2 prior left fem pop bypass grafts - First in 2013 lasted 3 years. 2nd in 2016 lasted 1 year - Her graft has been know to be occluded for about 2 months  - follow up with Dr. Oneida Alar in 2 weeks post discharge  - no need for repeat ABI at this time    Hypothyroidism - Continue levothyroxine 137 g by mouth daily.   Insulin dependent type 2 diabetes mellitus, with complications of neuropathy and PVD, nephropathy  - Continue SSI for now, also continue Lantus - resume metformin upon discharge but K to hold for now     Thrombocytosis - reactive - resolved    Essential hypertension - reasonably stable for now     CKD stage III - with baseline Cr ~ 1/4 and GFR in 40 - 60's in the past year  - Cr trending down  - BMP in AM   COPD (chronic obstructive pulmonary disease) (HCC) - Supplemental oxygen and bronchodilators as needed.   GERD - Continue proton pump inhibitor.   CAD (coronary artery disease) - asymptomatic at this  time   Hyperlipidemia - Continue pravastatin 20 mg by mouth daily.   Chronic pain syndrome - Continue nortriptyline, Neurontin and duloxetine. - Continue analgesics as needed    Anemia of CKD and DM - no sings of bleeding  - CBC In AM  DVT prophylaxis: SCD's Code Status: Full  Family Communication: Patient at bedside  Disposition Plan: Home in AM  Consultants:   VSS  Procedures:   None  Antimicrobials:   Zosyn 5/1 --> 5/4  Augmentin 5/4 -->   Subjective: Reports feeling better.   Objective: Filed Vitals:   06/26/15 1500 06/26/15 1959 06/27/15 0641 06/27/15 1346  BP: 172/64 155/72 176/73 153/69  Pulse: 77 65 79 76  Temp: 98 F (36.7 C) 98.1 F (36.7 C) 98.3 F (36.8 C) 98.3 F (36.8 C)  TempSrc: Oral Oral Oral Oral  Resp: 18 18  18   Height:      Weight:      SpO2: 100% 100% 96% 95%    Intake/Output Summary (Last 24 hours) at 06/27/15 1607 Last data filed at 06/26/15 1800  Gross per 24 hour  Intake      0 ml  Output    500 ml  Net   -500 ml   Filed Weights   06/24/15 2214  Weight: 78.654 kg (173 lb 6.4 oz)    Examination:  General exam: Appears calm and comfortable  Respiratory system: Clear to auscultation. Respiratory effort normal. Cardiovascular system: S1 & S2 heard, RRR. No JVD, rubs, gallops or clicks. No pedal edema. Skin: ~ 4 cm long wound on the left medial ankle; there is no drainage present with bloody drainage, scabbed areas on the lateral left leg of healing dog bites. Left foot still mildly swollen but improving overall  Psychiatry: Judgement and insight appear normal. Mood & affect appropriate.   Data Reviewed: I have personally reviewed following labs and imaging studies  CBC:  Recent Labs Lab 06/24/15 1534 06/25/15 0343 06/26/15 0248 06/27/15 0505  WBC 10.9* 9.0 7.9 7.5  NEUTROABS 6.7 6.3 5.2 4.6  HGB 11.1* 9.4* 10.4* 10.3*  HCT 35.7* 30.1* 33.0* 33.1*  MCV 81.9 82.7 84.4 84.4  PLT 446* 355 410* [id number]   Basic  Metabolic Panel:  Recent Labs Lab 06/24/15 1534 06/25/15 0343 06/26/15 0248 06/27/15 0435  NA 141 140 138 142  K 3.6 4.5 4.9 4.7  CL 109 112* 109 107  CO2 20* 19* 21* 26  GLUCOSE 63* 179* 152* 97  BUN 17 15 12 11   CREATININE 1.71* 1.47* 1.41* 1.22*  CALCIUM 9.1 7.9* 8.5* 8.9   Liver Function Tests:  Recent Labs Lab 06/24/15 1534 06/25/15 0343 06/26/15 0248 06/27/15 0435  AST 14* 11* 13* 14*  ALT 11* 8* 10* 8*  ALKPHOS 88 79 87 77  BILITOT 0.2* 0.2* 0.3 0.4  PROT 7.5 5.7* 6.4* 6.6  ALBUMIN 3.4* 2.5* 2.7* 2.7*   CBG:  Recent Labs Lab 06/26/15 1100 06/26/15 1630 06/26/15 2119 06/27/15 0630 06/27/15 1127  GLUCAP 166* 77 121* 85 232*       Component Value Date/Time   COLORURINE STRAW* 06/24/2015 2040   APPEARANCEUR CLEAR 06/24/2015 2040   LABSPEC 1.024 06/24/2015 2040   PHURINE 5.0 06/24/2015 2040   GLUCOSEU >1000* 06/24/2015 2040   HGBUR SMALL* 06/24/2015 2040   BILIRUBINUR NEGATIVE 06/24/2015 2040   KETONESUR NEGATIVE 06/24/2015 2040   PROTEINUR NEGATIVE 06/24/2015 2040   UROBILINOGEN 1.0 04/10/2014 1038   NITRITE NEGATIVE 06/24/2015 2040   LEUKOCYTESUR NEGATIVE 06/24/2015 2040   Recent Results (from the past 240 hour(s))  Blood culture (routine x 2)     Status: None (Preliminary result)   Collection Time: 06/24/15  4:00 PM  Result Value Ref Range Status   Specimen Description BLOOD RIGHT ANTECUBITAL  Final   Special Requests BOTTLES DRAWN AEROBIC AND ANAEROBIC 5CC  Final   Culture NO GROWTH 3 DAYS  Final   Report Status PENDING  Incomplete  Blood culture (routine x 2)     Status: None (Preliminary result)   Collection Time: 06/24/15  4:11 PM  Result Value Ref Range Status   Specimen Description BLOOD RIGHT HAND  Final   Special Requests BOTTLES DRAWN AEROBIC ONLY 5CC  Final   Culture NO GROWTH 3 DAYS  Final   Report Status PENDING  Incomplete      Radiology Studies: Mr Brain Wo Contrast  06/25/2015  CLINICAL DATA:  Right arm weakness and  numbness. EXAM: MRI HEAD WITHOUT CONTRAST TECHNIQUE: Multiplanar, multiecho pulse sequences of the brain and surrounding structures were obtained without intravenous contrast. COMPARISON:  03/15/2012 FINDINGS: There is no evidence of acute infarct, intracranial hemorrhage, mass, midline shift, or extra-axial fluid collection. Ventricles and sulci are within normal limits for age. Patchy T2 hyperintensities in the subcortical and periventricular cerebral white matter bilaterally have slightly progressed from the prior MRI and are nonspecific  but compatible with moderate chronic small vessel ischemic disease. Orbits are unremarkable. Postsurgical changes are present in the paranasal sinuses on the right with chronic right nasal cavity/ethmoid and mucosal thickening. The mastoid air cells are clear. Major intracranial vascular flow voids are preserved. IMPRESSION: 1. No acute intracranial abnormality. 2. Moderate chronic small vessel ischemic disease, slightly progressed from 2014. Electronically Signed   By: Logan Bores M.D.   On: 06/25/2015 21:06      Scheduled Meds: . clopidogrel  75 mg Oral Daily  . DULoxetine  60 mg Oral Daily  . feeding supplement (ENSURE ENLIVE)  237 mL Oral BID BM  . ferrous sulfate  325 mg Oral BID WC  . fesoterodine  8 mg Oral Daily  . fluconazole  100 mg Oral Daily  . gabapentin  200 mg Oral TID  . insulin aspart  0-15 Units Subcutaneous TID WC  . insulin glargine  60 Units Subcutaneous BID  . levothyroxine  137 mcg Oral QAC breakfast  . nortriptyline  75 mg Oral QHS  . pantoprazole  40 mg Oral Daily  . piperacillin-tazobactam (ZOSYN)  IV  3.375 g Intravenous Q8H  . pravastatin  20 mg Oral Daily  . sodium chloride flush  10-40 mL Intracatheter Q12H  . sodium chloride flush  3 mL Intravenous Q12H   Continuous Infusions:     LOS: 3 days   Time spent: 20 minutes   Faye Ramsay, MD [name] Pager [phone]  If 7PM-7AM, please contact  night-coverage [email or web address] Password [email or web address] 06/27/2015, 4:07 PM

## 2015-06-27 NOTE — Care Management Important Message (Signed)
Important Message  Patient Details  Name: April Branch MRN: [id number] Date of Birth: 1945/11/04   Medicare Important Message Given:  Yes    Nathen May 06/27/2015, 12:59 PM

## 2015-06-27 NOTE — Telephone Encounter (Signed)
-----   Message from Mena Goes, RN sent at 06/25/2015 12:53 PM EDT ----- Regarding: schedule   ----- Message -----    From: Gabriel Earing, PA-C    Sent: 06/25/2015  12:44 PM      To: Vvs Charge Pool  Pt needs to f/u with Dr. Oneida Alar in 2 weeks for check on new wound on left ankle.   Thanks, Aldona Bar

## 2015-06-27 NOTE — Progress Notes (Signed)
This admission has been reviewed and determined not to meet inpatient level of care. Both attending Physician and Medical Director are in agreement this should be an Observation encounter according to the Medicare Conditions of Participation as set forth in CFR 42 Chapter 456 482.12 (c) and the Medicare Condition Code-44 Regulations CFR 42 Chapter 100 - 04 50.3. The Patient and/or Patient Representative was notified via delivery of the "MEDICARE OBSERVATION STATUS NOTIFICATION".              

## 2015-06-27 NOTE — Telephone Encounter (Signed)
sched appt 5/10 at 3:00. Spoke to pt, pt still in the hosp, if pt is still admitted when we do the reminder call we will resch.

## 2015-06-28 ENCOUNTER — Encounter: Payer: Self-pay | Admitting: Vascular Surgery

## 2015-06-28 DIAGNOSIS — J438 Other emphysema: Secondary | ICD-10-CM | POA: Diagnosis not present

## 2015-06-28 DIAGNOSIS — G894 Chronic pain syndrome: Secondary | ICD-10-CM | POA: Diagnosis not present

## 2015-06-28 DIAGNOSIS — E08621 Diabetes mellitus due to underlying condition with foot ulcer: Secondary | ICD-10-CM | POA: Diagnosis not present

## 2015-06-28 DIAGNOSIS — E785 Hyperlipidemia, unspecified: Secondary | ICD-10-CM | POA: Diagnosis not present

## 2015-06-28 LAB — CBC
HCT: 34.4 % — ABNORMAL LOW (ref 36.0–46.0)
Hemoglobin: 11 g/dL — ABNORMAL LOW (ref 12.0–15.0)
MCH: 26.3 pg (ref 26.0–34.0)
MCHC: 32 g/dL (ref 30.0–36.0)
MCV: 82.1 fL (ref 78.0–100.0)
PLATELETS: 365 10*3/uL (ref 150–400)
RBC: 4.19 MIL/uL (ref 3.87–5.11)
RDW: 14.5 % (ref 11.5–15.5)
WBC: 8.4 10*3/uL (ref 4.0–10.5)

## 2015-06-28 LAB — GLUCOSE, CAPILLARY
GLUCOSE-CAPILLARY: 154 mg/dL — AB (ref 65–99)
Glucose-Capillary: 232 mg/dL — ABNORMAL HIGH (ref 65–99)

## 2015-06-28 LAB — BASIC METABOLIC PANEL
Anion gap: 11 (ref 5–15)
BUN: 13 mg/dL (ref 6–20)
CO2: 25 mmol/L (ref 22–32)
CREATININE: 1.25 mg/dL — AB (ref 0.44–1.00)
Calcium: 9.1 mg/dL (ref 8.9–10.3)
Chloride: 103 mmol/L (ref 101–111)
GFR calc Af Amer: 50 mL/min — ABNORMAL LOW (ref >60)
GFR, EST NON AFRICAN AMERICAN: 43 mL/min — AB (ref >60)
GLUCOSE: 159 mg/dL — AB (ref 65–99)
Potassium: 4.1 mmol/L (ref 3.5–5.1)
SODIUM: 139 mmol/L (ref 135–145)

## 2015-06-28 MED ORDER — FLUCONAZOLE 100 MG PO TABS: 100.0000 mg | ORAL_TABLET | Freq: Every day | ORAL | Status: DC | PRN

## 2015-06-28 MED ORDER — BACITRACIN ZINC 500 UNIT/GM EX OINT
TOPICAL_OINTMENT | Freq: Two times a day (BID) | CUTANEOUS | Status: DC
  Filled 2015-06-28: qty 28.35

## 2015-06-28 MED ORDER — AMOXICILLIN-POT CLAVULANATE 500-125 MG PO TABS: 1.0000 | ORAL_TABLET | Freq: Three times a day (TID) | ORAL | Status: AC

## 2015-06-28 MED ORDER — LORAZEPAM 1 MG PO TABS: 1.0000 mg | ORAL_TABLET | Freq: Two times a day (BID) | ORAL | Status: DC | PRN

## 2015-06-28 MED ORDER — HYDROCODONE-ACETAMINOPHEN 10-325 MG PO TABS: 1.0000 | ORAL_TABLET | ORAL | Status: DC | PRN

## 2015-06-28 NOTE — Clinical Social Work Note (Signed)
CSW informed by RN that patient has transportation needs. CSW met with patient. The patient reported all her family and friends are at work. She reported no one could provide her transportation to her sister house were she will be staying. Patient reported she has no funds to cover taxi and has never used public bus transportation. CSW provided patient's RN with cab voucher. CSW signing off.   [employer], LCSW [phone]

## 2015-06-28 NOTE — Discharge Summary (Signed)
Physician Discharge Summary  Patient ID: April Branch MRN: [id number] DOB/AGE: [birth year] 70 y.o.  Admit date: 06/24/2015 Discharge date: 06/28/2015  Admission Diagnoses: Diabetic Foot Ulcer  Discharge Diagnoses:  Principal Problem:   Diabetic foot ulcer ([place]) Active Problems:   Hypothyroidism   Insulin dependent type 2 diabetes mellitus, controlled ([place])   Essential hypertension   COPD (chronic obstructive pulmonary disease) (HCC)   GERD   CAD (coronary artery disease)   Hyperlipidemia   Chronic pain syndrome   Hypotension   Discharged Condition: good  Hospital Course:  69 y.o. female with known diabetic neuropathy in both feet, who is s/p left femoral to popliteal bypass graft with propaten on 10/11/11 with subsequent occlusion and redo bypass on 04/17/14, presented to [facility] ED with right upper extremity weakness and numbness that has improved since admission, left ankle wound after dog bite (occorued on June 14, 2015).   Diabetic foot ulcer, wound post dog bite, left medial ankle ([place]) - Some scant blood draining but overall better compared to admit - Started on Zosyn and moved Augmenting to complete therapy - started on 7 day abx course, that will be finished op - Wash with soap and water daily with dry dressing   2 prior left fem pop bypass grafts - First in 2013 lasted 3 years. 2nd in 2016 lasted 1 year - Her graft had been know to be occluded for about 2 months  - follow up with Dr. Oneida Alar in 2 weeks post discharge  - no need for repeat ABI at this time    Hypothyroidism - Continued levothyroxine 137 g by mouth daily.   Insulin dependent type 2 diabetes mellitus, with complications of neuropathy and PVD, nephropathy  - Continued SSI and continued Lantus - metformin held   Thrombocytosis - reactive - resolved    Essential hypertension - reasonably stable during stay   CKD stage III - with baseline Cr ~ 1/4 and GFR in 40 - 60's in the past year   - Cr trending down during stay and BMP in AM was ordered daily   COPD (chronic obstructive pulmonary disease) ([place]) - Had ordered supplemental oxygen and bronchodilators as needed.   GERD - Continue proton pump inhibitor.   CAD (coronary artery disease) - asymptomatic during stay on repeat evalutation   Hyperlipidemia - Continued pravastatin 20 mg by mouth daily.   Chronic pain syndrome - Continued nortriptyline, Neurontin and duloxetine. - Had analgesics as needed ordered   Anemia of CKD and DM - no sings of bleeding, followed by daily CBC In AM  Consults:  Ruta Hinds, MD [facility]  Wound Care Nurse  Significant Diagnostic Studies:   Mr Brain Wo Contrast  2015-07-20  CLINICAL DATA:  Right arm weakness and numbness. EXAM: MRI HEAD WITHOUT CONTRAST TECHNIQUE: Multiplanar, multiecho pulse sequences of the brain and surrounding structures were obtained without intravenous contrast. COMPARISON:  03/15/2012 FINDINGS: There is no evidence of acute infarct, intracranial hemorrhage, mass, midline shift, or extra-axial fluid collection. Ventricles and sulci are within normal limits for age. Patchy T2 hyperintensities in the subcortical and periventricular cerebral white matter bilaterally have slightly progressed from the prior MRI and are nonspecific but compatible with moderate chronic small vessel ischemic disease. Orbits are unremarkable. Postsurgical changes are present in the paranasal sinuses on the right with chronic right nasal cavity/ethmoid and mucosal thickening. The mastoid air cells are clear. Major intracranial vascular flow voids are preserved. IMPRESSION: 1. No acute intracranial abnormality.  2. Moderate chronic small vessel ischemic disease, slightly progressed from 2014. Electronically Signed   By: Logan Bores M.D.   On: 06/25/2015 21:06   Dg Chest Port 1 View  06/24/2015  CLINICAL DATA:  The patient has felt ill for 3 days.  Status post coffee ground emesis x2 today. Hypotension. Initial encounter. EXAM: PORTABLE CHEST 1 VIEW COMPARISON:  PA and lateral chest 04/14/2014 and 09/21/2012. FINDINGS: The lungs are clear. Heart size is mildly enlarged. No pneumothorax or pleural effusion. No focal bony abnormality. IMPRESSION: No acute disease. Electronically Signed   By: Inge Rise M.D.   On: 06/24/2015 15:59     Treatments: antibiotics: Zosyn and insulin: Lantus, SSI  Discharge Exam: Blood pressure 153/73, pulse 90, temperature 98.5 F (36.9 C), temperature source Oral, resp. rate 20, height 5\' 1"  (1.549 m), weight 78.654 kg (173 lb 6.4 oz), SpO2 94 %.  General exam: Appears calm and comfortable  Respiratory system: Clear to auscultation. Respiratory effort normal. Cardiovascular system: S1 & S2 heard, RRR. No JVD, rubs, gallops or clicks. No pedal edema. Skin: ~ 4 cm long wound on the left medial ankle; there is no drainage present with bloody drainage, scabbed areas on the lateral left leg of healing dog bites. Left foot still mildly swollen but improving overall  Psychiatry: Judgement and insight appear normal. Mood & affect appropriate.   Disposition: 01-Home or Self Care  Discharge Instructions    Nursing communication    Complete by:  As directed   1) Wound care teaching 2) Remove PICC line            Medication List    TAKE these medications        ACCU-CHEK AVIVA PLUS test strip  Generic drug:  glucose blood     ACCU-CHEK FASTCLIX LANCETS Misc  Use 4 per day to check blood sugar dx code E11.9     amoxicillin-clavulanate 875-125 MG tablet  Commonly known as:  AUGMENTIN  Take 1 tablet by mouth 2 (two) times daily.     amoxicillin-clavulanate 500-125 MG tablet  Commonly known as:  AUGMENTIN  Take 1 tablet (500 mg total) by mouth 3 (three) times daily.     B-D SINGLE USE SWABS REGULAR Pads     cilostazol 100 MG tablet  Commonly known as:  PLETAL  Take 1 tablet (100 mg total) by  mouth 2 (two) times daily before a meal.     clopidogrel 75 MG tablet  Commonly known as:  PLAVIX  Take 75 mg by mouth daily.     DULoxetine 60 MG capsule  Commonly known as:  CYMBALTA  TAKE ONE CAPSULE BY MOUTH EVERY DAY     ferrous sulfate 325 (65 FE) MG tablet  Take 325 mg by mouth 2 (two) times daily with a meal.     fluconazole 100 MG tablet  Commonly known as:  DIFLUCAN  Take 1 tablet (100 mg total) by mouth daily as needed (yeast infection).     gabapentin 100 MG capsule  Commonly known as:  NEURONTIN  Take 200 mg by mouth 3 (three) times daily.     HYDROcodone-acetaminophen 10-325 MG tablet  Commonly known as:  NORCO  Take 1 tablet by mouth every 4 (four) hours as needed (pain).     HYDROcodone-acetaminophen 10-325 MG tablet  Commonly known as:  NORCO  Take 1 tablet by mouth every 4 (four) hours as needed for moderate pain or severe pain.     Insulin Glargine  300 UNIT/ML Sopn  Commonly known as:  TOUJEO SOLOSTAR  Inject 60 Units into the skin 2 (two) times daily.     Insulin Pen Needle 32G X 4 MM Misc  Commonly known as:  BD PEN NEEDLE NANO U/F  Use to inject insulin     KLOR-CON 10 10 MEQ tablet  Generic drug:  potassium chloride  TAKE 1 TABLET BY MOUTH EVERY DAY     lansoprazole 30 MG capsule  Commonly known as:  PREVACID  Take 1 capsule (30 mg total) by mouth 2 (two) times daily before a meal.     levothyroxine 137 MCG tablet  Commonly known as:  SYNTHROID, LEVOTHROID  TAKE 1 TABLET (137 MCG TOTAL) BY MOUTH DAILY BEFORE BREAKFAST.     lisinopril 5 MG tablet  Commonly known as:  PRINIVIL,ZESTRIL  TAKE 1 TABLET (5 MG TOTAL) BY MOUTH DAILY.     LORazepam 1 MG tablet  Commonly known as:  ATIVAN  Take 1 tablet (1 mg total) by mouth 3 times/day as needed-between meals & bedtime for anxiety.     LORazepam 1 MG tablet  Commonly known as:  ATIVAN  Take 3 mg by mouth at bedtime.     metFORMIN 750 MG 24 hr tablet  Commonly known as:  GLUCOPHAGE XR  take  2 tablets daily     nortriptyline 75 MG capsule  Commonly known as:  PAMELOR  Take 75 mg by mouth at bedtime.     NOVOLOG FLEXPEN 100 UNIT/ML FlexPen  Generic drug:  insulin aspart  INJECT 10-15 UNITS INTO THE SKIN 3 TIMES A DAY WITH MEALS     ondansetron 8 MG tablet  Commonly known as:  ZOFRAN  Take 1 tablet (8 mg total) by mouth every 8 (eight) hours as needed for nausea or vomiting.     pravastatin 20 MG tablet  Commonly known as:  PRAVACHOL  Take 20 mg by mouth daily.     tolterodine 4 MG 24 hr capsule  Commonly known as:  DETROL LA  Take 4 mg by mouth daily.      ASK your doctor about these medications        canagliflozin 100 MG Tabs tablet  Commonly known as:  INVOKANA  1 tablet before breakfast           Follow-up Information    Follow up with Ruta Hinds, MD In 2 weeks.   Specialties:  Vascular Surgery, Cardiology   Why:  Office will call you to arrange your appt (sent)   Contact information:   [place] [address] [phone]       Signed: Elwin Mocha 06/28/2015, 11:26 AM

## 2015-06-29 LAB — CULTURE, BLOOD (ROUTINE X 2)
CULTURE: NO GROWTH
Culture: NO GROWTH

## 2015-07-03 ENCOUNTER — Encounter: Payer: Self-pay | Admitting: Vascular Surgery

## 2015-07-03 ENCOUNTER — Ambulatory Visit (INDEPENDENT_AMBULATORY_CARE_PROVIDER_SITE_OTHER): Payer: Medicare Other | Admitting: Vascular Surgery

## 2015-07-03 VITALS — BP 144/67 | HR 94 | Temp 98.3°F | Resp 16 | Ht 62.0 in | Wt 174.0 lb

## 2015-07-03 DIAGNOSIS — S91002D Unspecified open wound, left ankle, subsequent encounter: Secondary | ICD-10-CM

## 2015-07-03 NOTE — Progress Notes (Signed)
Patient is a 70 year old female that I recently saw in the hospital as a consult for a dog bite on her left leg. She has previously had 2 prior failed bypasses in the left leg. She presents today for recheck of the wound at her left ankle. She denies any fever or chills. She thinks that the wound is healing.  Review of systems: She does have some claudication in the left leg. This is not disabling to her. She denies rest pain in the left foot. She has shortness of breath with exertion but denies chest pain.  Physical exam:  Filed Vitals:   07/03/15 1404  BP: 144/67  Pulse: 94  Temp: 98.3 F (36.8 C)  TempSrc: Oral  Resp: 16  Height: 5\' 2"  (1.575 m)  Weight: 174 lb (78.926 kg)  SpO2: 95%   Left lower extremity: No palpable popliteal or pedal pulses 2+ left femoral pulse  Skin: 3 cm length by 5 mm with 2 mm depth laceration left medial ankle with some granulation tissue at the base of the wound in the graft assessment: Slowly healing left ankle laceration from dog bite. Overall looks clean with no evidence of infection hopefully this will heal without revascularization since this would be a third time redo operation with limited durability. The patient will follow-up with me in 2 weeks. She will do hydrogel dressings once daily in the meanwhile.  Ruta Hinds, MD [facility] [place] Office: [phone] Pager: [phone]

## 2015-07-17 ENCOUNTER — Encounter: Payer: Self-pay | Admitting: Vascular Surgery

## 2015-07-19 ENCOUNTER — Other Ambulatory Visit: Payer: Self-pay | Admitting: Endocrinology

## 2015-07-19 ENCOUNTER — Other Ambulatory Visit: Payer: Self-pay | Admitting: *Deleted

## 2015-07-19 MED ORDER — INSULIN LISPRO 100 UNIT/ML (KWIKPEN): 20.0000 [IU] | PEN_INJECTOR | Freq: Three times a day (TID) | SUBCUTANEOUS | Status: DC

## 2015-07-25 ENCOUNTER — Encounter: Payer: Self-pay | Admitting: Vascular Surgery

## 2015-07-25 ENCOUNTER — Ambulatory Visit (INDEPENDENT_AMBULATORY_CARE_PROVIDER_SITE_OTHER): Payer: Medicare Other | Admitting: Vascular Surgery

## 2015-07-25 ENCOUNTER — Ambulatory Visit: Payer: Medicare Other | Admitting: Vascular Surgery

## 2015-07-25 VITALS — BP 156/85 | HR 85 | Ht 62.0 in | Wt 166.8 lb

## 2015-07-25 DIAGNOSIS — I739 Peripheral vascular disease, unspecified: Secondary | ICD-10-CM | POA: Diagnosis not present

## 2015-07-25 NOTE — Progress Notes (Signed)
Patient is a 70 year old female that I recently saw in the hospital as a consult for a dog bite on her left leg. She has previously had 2 prior failed bypasses in the left leg. She presents today for recheck of the wound at her left ankle. She denies any fever or chills. She thinks that the wound is healing.  Review of systems: She does have some claudication in the left leg. This is not disabling to her. She denies rest pain in the left foot. She has shortness of breath with exertion but denies chest pain.  Physical exam:   Filed Vitals:   07/25/15 0842 07/25/15 0845  BP: 153/83 156/85  Pulse: 85   Height: 5\' 2"  (1.575 m)   Weight: 166 lb 12.8 oz (75.66 kg)   SpO2: 98%    Left lower extremity: No palpable popliteal or pedal pulses 2+ left femoral pulse  Skin: 2 cm length by 2 mm with <1 mm depth laceration left medial ankle with some granulation tissue at the base of the wound in the graft assessment: Slowly healing left ankle laceration from dog bite. Overall looks clean with no evidence of infection hopefully this will heal without revascularization since this would be a third time redo operation with limited durability. The patient will follow-up with me in October with repeat ABIs at that time.  She asked about narcotic pain medication today and I told her I do not believe she needs this for her claudication or her wound.  Extra strength tylenol should be sufficient.   Wound care now soap and water once daily with dry dressing for protection of wound  Ruta Hinds, MD [facility] [place] Office: [phone] Pager: [phone]

## 2015-08-01 ENCOUNTER — Other Ambulatory Visit: Payer: Self-pay | Admitting: Endocrinology

## 2015-08-02 ENCOUNTER — Other Ambulatory Visit (INDEPENDENT_AMBULATORY_CARE_PROVIDER_SITE_OTHER): Payer: Medicare Other

## 2015-08-02 DIAGNOSIS — Z794 Long term (current) use of insulin: Secondary | ICD-10-CM | POA: Diagnosis not present

## 2015-08-02 DIAGNOSIS — E1165 Type 2 diabetes mellitus with hyperglycemia: Secondary | ICD-10-CM | POA: Diagnosis not present

## 2015-08-02 LAB — LIPID PANEL
Cholesterol: 152 mg/dL (ref 0–200)
HDL: 40.3 mg/dL (ref >39.00)
NonHDL: 111.44
Total CHOL/HDL Ratio: 4
Triglycerides: 239 mg/dL — ABNORMAL HIGH (ref 0.0–149.0)
VLDL: 47.8 mg/dL — AB (ref 0.0–40.0)

## 2015-08-02 LAB — BASIC METABOLIC PANEL
BUN: 17 mg/dL (ref 6–23)
CHLORIDE: 102 meq/L (ref 96–112)
CO2: 27 meq/L (ref 19–32)
Calcium: 9.4 mg/dL (ref 8.4–10.5)
Creatinine, Ser: 1.2 mg/dL (ref 0.40–1.20)
GFR: 47.48 mL/min — ABNORMAL LOW (ref >60.00)
Glucose, Bld: 178 mg/dL — ABNORMAL HIGH (ref 70–99)
Potassium: 3.9 mEq/L (ref 3.5–5.1)
SODIUM: 139 meq/L (ref 135–145)

## 2015-08-02 LAB — LDL CHOLESTEROL, DIRECT: LDL DIRECT: 82 mg/dL

## 2015-08-03 LAB — FRUCTOSAMINE: FRUCTOSAMINE: 258 umol/L (ref 0–285)

## 2015-08-04 ENCOUNTER — Other Ambulatory Visit: Payer: Self-pay | Admitting: Endocrinology

## 2015-08-05 ENCOUNTER — Encounter: Payer: Self-pay | Admitting: Gastroenterology

## 2015-08-07 ENCOUNTER — Ambulatory Visit: Payer: Medicare Other | Admitting: Endocrinology

## 2015-08-07 DIAGNOSIS — Z0289 Encounter for other administrative examinations: Secondary | ICD-10-CM

## 2015-08-14 ENCOUNTER — Other Ambulatory Visit: Payer: Self-pay | Admitting: Endocrinology

## 2015-09-02 ENCOUNTER — Emergency Department (HOSPITAL_COMMUNITY): Payer: Medicare Other

## 2015-09-02 ENCOUNTER — Encounter (HOSPITAL_COMMUNITY)
Admission: EM | Disposition: A | Discharge status: Home / Self Care | Payer: Self-pay | Source: Home / Self Care | Attending: Emergency Medicine

## 2015-09-02 ENCOUNTER — Emergency Department (HOSPITAL_COMMUNITY): Payer: Medicare Other | Admitting: Anesthesiology

## 2015-09-02 ENCOUNTER — Observation Stay (HOSPITAL_COMMUNITY)
Admission: EM | Admit: 2015-09-02 | Discharge: 2015-09-03 | Disposition: A | Discharge status: Home / Self Care | Payer: Medicare Other | Attending: Internal Medicine | Admitting: Internal Medicine

## 2015-09-02 ENCOUNTER — Encounter (HOSPITAL_COMMUNITY): Payer: Self-pay | Admitting: *Deleted

## 2015-09-02 DIAGNOSIS — I25810 Atherosclerosis of coronary artery bypass graft(s) without angina pectoris: Secondary | ICD-10-CM | POA: Diagnosis not present

## 2015-09-02 DIAGNOSIS — E1122 Type 2 diabetes mellitus with diabetic chronic kidney disease: Secondary | ICD-10-CM | POA: Diagnosis not present

## 2015-09-02 DIAGNOSIS — R400 Somnolence: Secondary | ICD-10-CM | POA: Diagnosis present

## 2015-09-02 DIAGNOSIS — F329 Major depressive disorder, single episode, unspecified: Secondary | ICD-10-CM | POA: Diagnosis not present

## 2015-09-02 DIAGNOSIS — I70209 Unspecified atherosclerosis of native arteries of extremities, unspecified extremity: Secondary | ICD-10-CM | POA: Diagnosis not present

## 2015-09-02 DIAGNOSIS — X58XXXA Exposure to other specified factors, initial encounter: Secondary | ICD-10-CM | POA: Insufficient documentation

## 2015-09-02 DIAGNOSIS — M1990 Unspecified osteoarthritis, unspecified site: Secondary | ICD-10-CM | POA: Insufficient documentation

## 2015-09-02 DIAGNOSIS — I129 Hypertensive chronic kidney disease with stage 1 through stage 4 chronic kidney disease, or unspecified chronic kidney disease: Secondary | ICD-10-CM | POA: Insufficient documentation

## 2015-09-02 DIAGNOSIS — Z8673 Personal history of transient ischemic attack (TIA), and cerebral infarction without residual deficits: Secondary | ICD-10-CM | POA: Insufficient documentation

## 2015-09-02 DIAGNOSIS — I2510 Atherosclerotic heart disease of native coronary artery without angina pectoris: Secondary | ICD-10-CM | POA: Diagnosis not present

## 2015-09-02 DIAGNOSIS — N189 Chronic kidney disease, unspecified: Secondary | ICD-10-CM | POA: Insufficient documentation

## 2015-09-02 DIAGNOSIS — Z90710 Acquired absence of both cervix and uterus: Secondary | ICD-10-CM | POA: Insufficient documentation

## 2015-09-02 DIAGNOSIS — T18128A Food in esophagus causing other injury, initial encounter: Secondary | ICD-10-CM | POA: Diagnosis not present

## 2015-09-02 DIAGNOSIS — F419 Anxiety disorder, unspecified: Secondary | ICD-10-CM | POA: Diagnosis not present

## 2015-09-02 DIAGNOSIS — Z7902 Long term (current) use of antithrombotics/antiplatelets: Secondary | ICD-10-CM | POA: Insufficient documentation

## 2015-09-02 DIAGNOSIS — E11621 Type 2 diabetes mellitus with foot ulcer: Secondary | ICD-10-CM | POA: Insufficient documentation

## 2015-09-02 DIAGNOSIS — Z85528 Personal history of other malignant neoplasm of kidney: Secondary | ICD-10-CM | POA: Insufficient documentation

## 2015-09-02 DIAGNOSIS — K219 Gastro-esophageal reflux disease without esophagitis: Secondary | ICD-10-CM | POA: Diagnosis not present

## 2015-09-02 DIAGNOSIS — Y939 Activity, unspecified: Secondary | ICD-10-CM | POA: Insufficient documentation

## 2015-09-02 DIAGNOSIS — D631 Anemia in chronic kidney disease: Secondary | ICD-10-CM | POA: Insufficient documentation

## 2015-09-02 DIAGNOSIS — J449 Chronic obstructive pulmonary disease, unspecified: Secondary | ICD-10-CM | POA: Diagnosis not present

## 2015-09-02 DIAGNOSIS — Z801 Family history of malignant neoplasm of trachea, bronchus and lung: Secondary | ICD-10-CM | POA: Insufficient documentation

## 2015-09-02 DIAGNOSIS — Z9049 Acquired absence of other specified parts of digestive tract: Secondary | ICD-10-CM | POA: Insufficient documentation

## 2015-09-02 DIAGNOSIS — E039 Hypothyroidism, unspecified: Secondary | ICD-10-CM | POA: Diagnosis present

## 2015-09-02 DIAGNOSIS — E785 Hyperlipidemia, unspecified: Secondary | ICD-10-CM | POA: Diagnosis not present

## 2015-09-02 DIAGNOSIS — Z794 Long term (current) use of insulin: Secondary | ICD-10-CM | POA: Insufficient documentation

## 2015-09-02 DIAGNOSIS — M797 Fibromyalgia: Secondary | ICD-10-CM | POA: Diagnosis not present

## 2015-09-02 DIAGNOSIS — Z87891 Personal history of nicotine dependence: Secondary | ICD-10-CM | POA: Insufficient documentation

## 2015-09-02 DIAGNOSIS — I10 Essential (primary) hypertension: Secondary | ICD-10-CM | POA: Diagnosis present

## 2015-09-02 DIAGNOSIS — E1151 Type 2 diabetes mellitus with diabetic peripheral angiopathy without gangrene: Secondary | ICD-10-CM | POA: Insufficient documentation

## 2015-09-02 DIAGNOSIS — Z86010 Personal history of colonic polyps: Secondary | ICD-10-CM | POA: Diagnosis not present

## 2015-09-02 DIAGNOSIS — Z8249 Family history of ischemic heart disease and other diseases of the circulatory system: Secondary | ICD-10-CM | POA: Insufficient documentation

## 2015-09-02 DIAGNOSIS — I739 Peripheral vascular disease, unspecified: Secondary | ICD-10-CM | POA: Diagnosis present

## 2015-09-02 DIAGNOSIS — I252 Old myocardial infarction: Secondary | ICD-10-CM | POA: Diagnosis not present

## 2015-09-02 DIAGNOSIS — E1140 Type 2 diabetes mellitus with diabetic neuropathy, unspecified: Secondary | ICD-10-CM | POA: Insufficient documentation

## 2015-09-02 DIAGNOSIS — Z79899 Other long term (current) drug therapy: Secondary | ICD-10-CM | POA: Insufficient documentation

## 2015-09-02 DIAGNOSIS — E119 Type 2 diabetes mellitus without complications: Secondary | ICD-10-CM

## 2015-09-02 DIAGNOSIS — W44F3XA Food entering into or through a natural orifice, initial encounter: Secondary | ICD-10-CM | POA: Diagnosis present

## 2015-09-02 HISTORY — PX: ESOPHAGOGASTRODUODENOSCOPY (EGD) WITH PROPOFOL: SHX5813

## 2015-09-02 SURGERY — ESOPHAGOGASTRODUODENOSCOPY (EGD) WITH PROPOFOL
Anesthesia: General

## 2015-09-02 MED ORDER — PHENYLEPHRINE HCL 10 MG/ML IJ SOLN
10.0000 mg | INTRAVENOUS | Status: DC | PRN
  Administered 2015-09-02: 100 ug/min via INTRAVENOUS

## 2015-09-02 MED ORDER — ONDANSETRON HCL 4 MG/2ML IJ SOLN
INTRAMUSCULAR | Status: DC | PRN
  Administered 2015-09-02: 4 mg via INTRAVENOUS

## 2015-09-02 MED ORDER — LACTATED RINGERS IV SOLN
INTRAVENOUS | Status: DC | PRN
  Administered 2015-09-02: 23:00:00 via INTRAVENOUS

## 2015-09-02 MED ORDER — LIDOCAINE HCL (CARDIAC) 20 MG/ML IV SOLN
INTRAVENOUS | Status: DC | PRN
  Administered 2015-09-02: 100 mg via INTRAVENOUS

## 2015-09-02 MED ORDER — FENTANYL CITRATE (PF) 100 MCG/2ML IJ SOLN
INTRAMUSCULAR | Status: DC | PRN
  Administered 2015-09-02 (×5): 50 ug via INTRAVENOUS

## 2015-09-02 MED ORDER — NITROGLYCERIN 0.4 MG SL SUBL
0.4000 mg | SUBLINGUAL_TABLET | Freq: Once | SUBLINGUAL | Status: AC
  Administered 2015-09-02: 0.4 mg via SUBLINGUAL
  Filled 2015-09-02: qty 1

## 2015-09-02 MED ORDER — SUCCINYLCHOLINE CHLORIDE 20 MG/ML IJ SOLN
INTRAMUSCULAR | Status: DC | PRN
  Administered 2015-09-02: 100 mg via INTRAVENOUS

## 2015-09-02 MED ORDER — LORAZEPAM 2 MG/ML IJ SOLN
1.0000 mg | Freq: Once | INTRAMUSCULAR | Status: AC
  Administered 2015-09-02: 1 mg via INTRAVENOUS
  Filled 2015-09-02: qty 1

## 2015-09-02 MED ORDER — NITROGLYCERIN 0.4 MG SL SUBL: 0.4000 mg | SUBLINGUAL_TABLET | SUBLINGUAL | Status: DC | PRN

## 2015-09-02 MED ORDER — FENTANYL CITRATE (PF) 100 MCG/2ML IJ SOLN
100.0000 ug | Freq: Once | INTRAMUSCULAR | Status: AC
  Administered 2015-09-02: 100 ug via INTRAVENOUS
  Filled 2015-09-02: qty 2

## 2015-09-02 MED ORDER — PROPOFOL 10 MG/ML IV BOLUS
INTRAVENOUS | Status: DC | PRN
  Administered 2015-09-02: 140 mg via INTRAVENOUS

## 2015-09-02 MED ORDER — FENTANYL CITRATE (PF) 250 MCG/5ML IJ SOLN
INTRAMUSCULAR | Status: AC
  Filled 2015-09-02: qty 5

## 2015-09-02 MED ORDER — GLUCAGON HCL RDNA (DIAGNOSTIC) 1 MG IJ SOLR
1.0000 mg | Freq: Once | INTRAMUSCULAR | Status: AC
  Administered 2015-09-02: 1 mg via INTRAVENOUS
  Filled 2015-09-02: qty 1

## 2015-09-02 NOTE — Anesthesia Procedure Notes (Signed)
Procedure Name: Intubation Date/Time: 09/02/2015 11:14 PM Performed by: Lance Coon Pre-anesthesia Checklist: Patient identified, Emergency Drugs available, Suction available, Patient being monitored and Timeout performed Patient Re-evaluated:Patient Re-evaluated prior to inductionOxygen Delivery Method: Circle system utilized Preoxygenation: Pre-oxygenation with 100% oxygen Intubation Type: IV induction, Rapid sequence and Cricoid Pressure applied Laryngoscope Size: Miller and 3 Grade View: Grade I Tube type: Oral Tube size: 7.0 mm Number of attempts: 1 Airway Equipment and Method: Stylet Placement Confirmation: ETT inserted through vocal cords under direct vision,  positive ETCO2 and breath sounds checked- equal and bilateral Secured at: 22 cm Tube secured with: Tape Dental Injury: Teeth and Oropharynx as per pre-operative assessment

## 2015-09-02 NOTE — ED Provider Notes (Signed)
CSN: [id number]     Arrival date & time 09/02/15  1629 History   First MD Initiated Contact with Patient 09/02/15 1739     Chief Complaint  Patient presents with  . Food stuck in esophagus      (Consider location/radiation/quality/duration/timing/severity/associated sxs/prior Treatment) HPI  Ate a large piece of roast beef last night and felt it got stuck in esophagus. Since then has had difficulty drinking fluids which also makes it worse and eating food causing vomiting. Never had this before. No other symptoms.   Past Medical History  Diagnosis Date  . Hyperlipidemia   . Arthritis   . Hypothyroidism   . GERD (gastroesophageal reflux disease)   . Anxiety   . Diabetes mellitus age 59    insulin dependent  . Peripheral vascular disease ([place])   . Lumbar disc disease   . Insulin dependent type 2 diabetes mellitus, controlled ([place])   . GERD 07/20/2006  . CAD (coronary artery disease)   . CAD (coronary artery disease) of bypass graft   . Vertigo   . History of transient ischemic attack (TIA)   . Myocardial infarction ([place])     AGE 40    . Hypertension   . Depression   . COPD 12/14/2008    PATIENT DENIES    . Personal history of malignant neoplasm of kidney(V10.52) 12/14/2008    Laparoscopic biopsy and cryoablation 7/08 Dr. Vernie Shanks   . Headache(784.0)   . Neuromuscular disorder ([place])     PERIPHERAL NEUROPATHY  . Anemia   . Diabetic coma ([place]) Feb. 2014  . Fall at home Jan. 2, 2016  Jan. 13, 2016    Left knee  . Anginal pain ([place])     1 MONTH AGO   . Stroke [facility])     3 MINI STROKES  RT SIDED WEAKNESS  . Fibromyalgia   . HPV (human papilloma virus) infection   . Colon polyps 09/11/2010    Tubular adenoma  . 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 Appendectomy    . Abdominal hysterectomy    . Knee arthroscopy Bilateral     "cartilage"  . Lung surgery Right     lung nodule  removed from the right side  . Laparascopic cryoablation of left kidney  08/2006    Dr. Gladis Riffle for renal cell cancer  . Bladder suspension    . Shoulder arthroscopy Bilateral     'Spurs"  . Foot surgery Left     "bone spurs"  . Pr vein bypass graft,aorto-fem-pop  05/27/2010  . Cardiac catheterization    . Femoral-popliteal bypass graft  10/12/2011    Procedure: BYPASS GRAFT FEMORAL-POPLITEAL ARTERY;  Surgeon: Elam Dutch, MD;  Location: [facility] OR;  Service: Vascular;  Laterality: Left;  Left Femoral-Popliteal Bypass Graft using 86mm x 80cm Propaten Graft with intraop arteriogram times one.  . Intraoperative arteriogram  10/12/2011    Procedure: INTRA OPERATIVE ARTERIOGRAM;  Surgeon: Elam Dutch, MD;  Location: [place];  Service: Vascular;  Laterality: Left;  . Left heart catheterization with coronary angiogram N/A 05/11/2011    Procedure: LEFT HEART CATHETERIZATION WITH CORONARY ANGIOGRAM;  Surgeon: Jolaine Artist, MD;  Location[facility] C[facility] LAB;  Service: Cardiovascular;  Laterality: N/A;  . Abdominal aortagram N/A 08/21/2011    Procedure: ABDOMINAL Maxcine Ham;  Surgeon: Elam Dutch, MD;  Location: [facility] CATH LAB;  Service: Cardiovascular;  Laterality: N/A;  . Abdominal aortagram N/A 03/16/2014    Procedure: ABDOMINAL AORTAGRAM;  Surgeon: Elam Dutch, MD;  Location: [facility] CATH LAB;  Service: Cardiovascular;  Laterality: N/A;  . Femoral-popliteal bypass graft Left 04/17/2014    Procedure: REDO LEFT FEMORAL-POPLITEAL ARTERY BYPASS USING GORE PROPATEN 10mmx80cm GRAFT;  Surgeon: Elam Dutch, MD;  Location: [place];  Service: Vascular;  Laterality: Left;  . Esophagogastroduodenoscopy (egd) with propofol N/A 05/16/2014    Procedure: ESOPHAGOGASTRODUODENOSCOPY (EGD) WITH PROPOFOL with Balloon dilation;  Surgeon: Milus Banister, MD;  Location: [place];  Service: Endoscopy;  Laterality: N/A;  . Cholecystectomy open    . Tubal ligation     Family History  Problem Relation Age of Onset  .  Heart disease Mother   . Lung cancer Mother   . Cancer Mother     Lung  . Heart disease Father   . Lung cancer Father   . Cancer Father     Lung  . Heart disease Sister     CABG- Open Heart   Social History  Substance Use Topics  . Smoking status: Former Smoker -- 0.25 packs/day for 50 years    Types: Cigarettes    Quit date: 06/24/2014  . Smokeless tobacco: Never Used  . Alcohol Use: No   OB History    No data available     Review of Systems  All other systems reviewed and are negative.     Allergies  Review of patient's allergies indicates no known allergies.  Home Medications   Prior to Admission medications   Medication Sig Start Date End Date Taking? Authorizing Provider  clopidogrel (PLAVIX) 75 MG tablet Take 75 mg by mouth daily.  04/02/14  Yes Historical Provider, MD  DULoxetine (CYMBALTA) 60 MG capsule TAKE ONE CAPSULE BY MOUTH EVERY DAY 08/05/15  Yes Elayne Snare, MD  ferrous sulfate 325 (65 FE) MG tablet Take 325 mg by mouth 2 (two) times daily with a meal.   Yes Historical Provider, MD  gabapentin (NEURONTIN) 100 MG capsule Take 200 mg by mouth at bedtime.  01/07/15  Yes Historical Provider, MD  HYDROcodone-acetaminophen (NORCO) 10-325 MG tablet Take 1 tablet by mouth every 4 (four) hours as needed for moderate pain or severe pain. 06/28/15  Yes Elwin Mocha, MD  insulin lispro (HUMALOG KWIKPEN) 100 UNIT/ML KiwkPen Inject 0.2 mLs (20 Units total) into the skin 3 (three) times daily. 07/19/15  Yes Elayne Snare, MD  KLOR-CON 10 10 MEQ tablet TAKE 1 TABLET BY MOUTH EVERY DAY 08/05/15  Yes Elayne Snare, MD  lansoprazole (PREVACID) 30 MG capsule Take 1 capsule (30 mg total) by mouth 2 (two) times daily before a meal. Patient taking differently: Take 30 mg by mouth daily.  05/16/14  Yes Milus Banister, MD  levothyroxine (SYNTHROID, LEVOTHROID) 137 MCG tablet TAKE 1 TABLET (137 MCG TOTAL) BY MOUTH DAILY BEFORE BREAKFAST. 08/05/15  Yes Elayne Snare, MD  lisinopril  (PRINIVIL,ZESTRIL) 5 MG tablet TAKE 1 TABLET (5 MG TOTAL) BY MOUTH DAILY. 04/22/15  Yes Elayne Snare, MD  LORazepam (ATIVAN) 1 MG tablet Take 1 tablet (1 mg total) by mouth 3 times/day as needed-between meals & bedtime for anxiety. 06/28/15  Yes Elwin Mocha, MD  metFORMIN (GLUCOPHAGE-XR) 750 MG 24 hr tablet TAKE 2 TABLETS BY MOUTH EVERY DAY 08/14/15  Yes Elayne Snare, MD  nortriptyline (PAMELOR) 75 MG capsule Take 75 mg by mouth at bedtime. 01/24/14  Yes Historical Provider, MD  pravastatin (PRAVACHOL) 20 MG  tablet Take 20 mg by mouth daily.   Yes Historical Provider, MD  tolterodine (DETROL LA) 4 MG 24 hr capsule Take 4 mg by mouth daily.    Yes Historical Provider, MD  TOUJEO SOLOSTAR 300 UNIT/ML SOPN INJECT 60 UNITS INTO THE SKIN 2 TIMES DAILY. 08/02/15  Yes Elayne Snare, MD  ACCU-CHEK AVIVA PLUS test strip  01/24/14   Historical Provider, MD  ACCU-CHEK FASTCLIX LANCETS MISC Use 4 per day to check blood sugar dx code E11.9 11/29/14   Elayne Snare, MD  Alcohol Swabs (B-D SINGLE USE SWABS REGULAR) PADS  03/21/13   Historical Provider, MD  Insulin Pen Needle (BD PEN NEEDLE NANO U/F) 32G X 4 MM MISC Use to inject insulin 10/30/14   Elayne Snare, MD  NOVOLOG FLEXPEN 100 UNIT/ML FlexPen INJECT 20 UNITS INTO THE SKIN 3 TIMES A DAY WITH MEALS Patient not taking: Reported on 09/02/2015 07/19/15   Elayne Snare, MD   BP 158/56 mmHg  Pulse 67  Temp(Src) 98.3 F (36.8 C) (Oral)  Resp 18  SpO2 96% Physical Exam  Constitutional: She is oriented to person, place, and time. She appears well-developed and well-nourished.  HENT:  Head: Normocephalic and atraumatic.  Neck: Normal range of motion.  Cardiovascular: Normal rate and regular rhythm.   Pulmonary/Chest: Effort normal. No stridor. No respiratory distress.  Abdominal: She exhibits no distension.  Musculoskeletal: Normal range of motion. She exhibits no edema or tenderness.  Neurological: She is alert and oriented to person, place, and time. No cranial nerve deficit.  Coordination normal.  Nursing note and vitals reviewed.   ED Course  Procedures (including critical care time) Labs Review Labs Reviewed - No data to display  Imaging Review Dg Chest 2 View  09/02/2015  CLINICAL DATA:  Evaluate foreign body. EXAM: CHEST  2 VIEW COMPARISON:  06/24/2015 FINDINGS: There is bilateral mild chronic interstitial thickening. There is no focal parenchymal opacity. There is no pleural effusion or pneumothorax. The heart and mediastinal contours are unremarkable. The osseous structures are unremarkable. IMPRESSION: No active cardiopulmonary disease. Electronically Signed   By: Kathreen Devoid   On: 09/02/2015 18:38   I have personally reviewed and evaluated these images and lab results as part of my medical decision-making.   EKG Interpretation   Date/Time:  Monday September 02 2015 18:03:51 EDT Ventricular Rate:  60 PR Interval:    QRS Duration: 102 QT Interval:  421 QTC Calculation: 421 R Axis:   133 Text Interpretation:  Sinus rhythm Right axis deviation Confirmed by  Shamra Bradeen MD, Corene Cornea [id number]) on 09/02/2015 6:15:41 PM      MDM   Final diagnoses:  Food impaction of esophagus, initial encounter   Esophageal impaction last night. Difficulty eating/drinking since then. Will try glucagon/ativan/NTG. If not able to expel will consult GI.  Attempted cocktail without resolution. Discussed with [facility] who will see the patient for EGD tonight.    Merrily Pew, MD 09/03/15 [phone]

## 2015-09-02 NOTE — ED Notes (Signed)
Patient called out, reporting worsening pain. MD notified.

## 2015-09-02 NOTE — ED Notes (Signed)
Patient's $100 bill placed in envelope in lock box with security.

## 2015-09-02 NOTE — Anesthesia Preprocedure Evaluation (Addendum)
Anesthesia Evaluation  Patient identified by MRN, date of birth, ID band Patient awake    Reviewed: Allergy & Precautions, H&P , NPO status , Patient's Chart, lab work & pertinent test results  History of Anesthesia Complications Negative for: history of anesthetic complications  Airway Mallampati: I  TM Distance: >3 FB Neck ROM: Full    Dental  (+) Edentulous Upper, Edentulous Lower, Dental Advisory Given, Upper Dentures, Lower Dentures   Pulmonary COPD, Current Smoker, former smoker,    Pulmonary exam normal        Cardiovascular hypertension, Pt. on medications + angina + CAD, + Past MI and + Peripheral Vascular Disease  Normal cardiovascular exam  '13 ECHO: EF 55-60%, valves OK 04/10/14 stress test: normal perfusio   Neuro/Psych PSYCHIATRIC DISORDERS Anxiety Depression  Neuromuscular disease CVA, No Residual Symptoms    GI/Hepatic Neg liver ROS, GERD  ,  Endo/Other  diabetesHypothyroidism   Renal/GU      Musculoskeletal  (+) Arthritis , Fibromyalgia -  Abdominal   Peds  Hematology  (+) anemia ,   Anesthesia Other Findings   Reproductive/Obstetrics                            Anesthesia Physical  Anesthesia Plan  ASA: III and emergent  Anesthesia Plan: General   Post-op Pain Management:    Induction: Intravenous, Rapid sequence and Cricoid pressure planned  Airway Management Planned: Oral ETT  Additional Equipment:   Intra-op Plan:   Post-operative Plan:   Informed Consent:   Plan Discussed with: Anesthesiologist  Anesthesia Plan Comments:         Anesthesia Quick Evaluation

## 2015-09-02 NOTE — ED Notes (Signed)
Patient transported to X-ray 

## 2015-09-02 NOTE — Interval H&P Note (Signed)
History and Physical Interval Note:  09/02/2015 11:05 PM  April Branch  has presented today for surgery, with the diagnosis of Food impaction  The various methods of treatment have been discussed with the patient and family. After consideration of risks, benefits and other options for treatment, the patient has consented to  Procedure(s): ESOPHAGOGASTRODUODENOSCOPY (EGD) WITH PROPOFOL (N/A) as a surgical intervention .  The patient's history has been reviewed, patient examined, no change in status, stable for surgery.  I have reviewed the patient's chart and labs.  Questions were answered to the patient's satisfaction.     Nelida Meuse III

## 2015-09-02 NOTE — ED Notes (Signed)
Last night ate beef steak and it has been hung in her esophagus.  Pt states vomiting and nothing goes down.

## 2015-09-02 NOTE — H&P (Signed)
[facility] Consult Note   History April Branch MRN # [id number]  Date of Admission: 09/02/2015 Date of Consultation: 09/02/2015 Referring physician: Dr. Rayne Du att. providers found  Reason for Consultation/Chief Complaint: dysphagia  Subjective HPI:  Patient developed difficulty swallowing last night after eating meat.  Has been stuck in there since then, cannot swallow small sips of liquids.  Had prior food impactions, underwent outpatient EGD with balloon dilation by Dr Ardis Hughs March 2016 after an office visit.  ROS:  Normal vocal quality Cardiovascular:   Has chest pressure from the impaction Respiratory:   No dyspnea   All other systems are negative except as noted above in the HPI  Past Medical History Past Medical History  Diagnosis Date  . Hyperlipidemia   . Arthritis   . Hypothyroidism   . GERD (gastroesophageal reflux disease)   . Anxiety   . Diabetes mellitus age 70    insulin dependent  . Peripheral vascular disease ([place])   . Lumbar disc disease   . Insulin dependent type 2 diabetes mellitus, controlled ([place])   . GERD 07/20/2006  . CAD (coronary artery disease)   . CAD (coronary artery disease) of bypass graft   . Vertigo   . History of transient ischemic attack (TIA)   . Myocardial infarction ([place])     AGE 70    . Hypertension   . Depression   . COPD 12/14/2008    PATIENT DENIES    . Personal history of malignant neoplasm of kidney(V10.52) 12/14/2008    Laparoscopic biopsy and cryoablation 7/08 Dr. Vernie Shanks   . Headache(784.0)   . Neuromuscular disorder ([place])     PERIPHERAL NEUROPATHY  . Anemia   . Diabetic coma ([place]) Feb. 2014  . Fall at home Jan. 2, 2016  Jan. 13, 2016    Left knee  . Anginal pain ([place])     1 MONTH AGO   . [facility])     3 MINI STROKES  RT SIDED WEAKNESS  . Fibromyalgia   . HPV (human papilloma virus) infection   . Colon polyps 09/11/2010    Tubular adenoma  . Cancer [facility])     CANCER OF KIDNEY  DR  DALDSTEDT (LEFT), "They froze it."    Past Surgical History Past Surgical History  Procedure Laterality Date  . Appendectomy    . Abdominal hysterectomy    . Knee arthroscopy Bilateral     "cartilage"  . Lung surgery Right     lung nodule removed from the right side  . Laparascopic cryoablation of left kidney  08/2006    Dr. Gladis Riffle for renal cell cancer  . Bladder suspension    . Shoulder arthroscopy Bilateral     'Spurs"  . Foot surgery Left     "bone spurs"  . Pr vein bypass graft,aorto-fem-pop  05/27/2010  . Cardiac catheterization    . Femoral-popliteal bypass graft  10/12/2011    Procedure: BYPASS GRAFT FEMORAL-POPLITEAL ARTERY;  Surgeon: Elam Dutch, MD;  Location: [facility] OR;  Service: Vascular;  Laterality: Left;  Left Femoral-Popliteal Bypass Graft using 76mm x 80cm Propaten Graft with intraop arteriogram times one.  . Intraoperative arteriogram  10/12/2011    Procedure: INTRA OPERATIVE ARTERIOGRAM;  Surgeon: Elam Dutch, MD;  Location: [place];  Service: Vascular;  Laterality: Left;  . Left heart catheterization with coronary angiogram N/A 05/11/2011    Procedure: LEFT HEART CATHETERIZATION WITH CORONARY ANGIOGRAM;  Surgeon: Jolaine Artist, MD;  Location: [facility] CATH LAB;  Service: Cardiovascular;  Laterality: N/A;  . Abdominal aortagram N/A 08/21/2011    Procedure: ABDOMINAL Maxcine Ham;  Surgeon: Elam Dutch, MD;  Location: [facility] CATH LAB;  Service: Cardiovascular;  Laterality: N/A;  . Abdominal aortagram N/A 03/16/2014    Procedure: ABDOMINAL Maxcine Ham;  Surgeon: Elam Dutch, MD;  Location: [facility] CATH LAB;  Service: Cardiovascular;  Laterality: N/A;  . Femoral-popliteal bypass graft Left 04/17/2014    Procedure: REDO LEFT FEMORAL-POPLITEAL ARTERY BYPASS USING GORE PROPATEN 70mmx80cm GRAFT;  Surgeon: Elam Dutch, MD;  Location: [place];  Service: Vascular;  Laterality: Left;  . Esophagogastroduodenoscopy (egd) with propofol N/A 05/16/2014    Procedure:  ESOPHAGOGASTRODUODENOSCOPY (EGD) WITH PROPOFOL with Balloon dilation;  Surgeon: Milus Banister, MD;  Location: [place];  Service: Endoscopy;  Laterality: N/A;  . Cholecystectomy open    . Tubal ligation      Family History Family History  Problem Relation Age of Onset  . Heart disease Mother   . Lung cancer Mother   . Cancer Mother     Lung  . Heart disease Father   . Lung cancer Father   . Cancer Father     Lung  . Heart disease Sister     CABG- Open Heart    Social History Social History   Social History  . Marital Status: Widowed    Spouse Name: N/A  . Number of Children: 2  . Years of Education: N/A   Occupational History  .     Social History Main Topics  . Smoking status: Former Smoker -- 0.25 packs/day for 50 years    Types: Cigarettes    Quit date: 06/24/2014  . Smokeless tobacco: Never Used  . Alcohol Use: No  . Drug Use: No  . Sexual Activity: No   Other Topics Concern  . None   Social History Narrative   Widow. Two sons. Husband died of lung cancer.               Allergies No Known Allergies  Outpatient Meds Home medications from the H+P and/or nursing med reconciliation reviewed.  Inpatient med list reviewed  _____________________________________________________________________ Objective  Exam:  Current vital signs  Patient Vitals for the past 8 hrs:  BP Temp Temp src Pulse Resp SpO2  09/02/15 2253 (!) 158/56 mmHg - Oral 67 18 96 %  09/02/15 2200 (!) 124/51 mmHg - - 64 - 94 %  09/02/15 2145 130/56 mmHg - - (!) 59 - 95 %  09/02/15 2130 141/56 mmHg - - 64 - 94 %  09/02/15 2115 97/76 mmHg - - 64 - 96 %  09/02/15 2045 120/58 mmHg - - 65 - 100 %  09/02/15 2030 132/55 mmHg - - 66 - 95 %  09/02/15 2015 119/58 mmHg - - 64 - 98 %  09/02/15 1945 (!) 112/48 mmHg - - 67 - 97 %  09/02/15 1930 (!) 129/44 mmHg - - 62 - 96 %  09/02/15 1900 156/60 mmHg - - 69 - 97 %  09/02/15 1801 130/63 mmHg - - 60 19 97 %  09/02/15 1700 118/76 mmHg  98.3 F (36.8 C) Oral 64 18 98 %   No intake or output data in the 24 hours ending 09/02/15 2300  Physical Exam:    General: this is a chronically ill-appearing woman who is uncomfortable but non-toxic  Eyes: sclera anicteric, no redness  ENT: oral mucosa moist without lesions, no cervical or supraclavicular lymphadenopathy, no dentition (took dentures out for procedure)  CV: RRR without murmur, S1/S2, no JVD,, no peripheral edema  Resp: clear to auscultation bilaterally, normal RR and effort noted  GI: soft, no tenderness, with active bowel sounds. No guarding or palpable organomegaly noted  Skin; warm and dry, no rash or jaundice noted  Neuro: awake, alert and oriented x 3. Normal gross motor function and fluent speech.  Labs:  No results for input(s): WBC, HGB, HCT, PLT in the last 168 hours. No results for input(s): NA, K, CL, CO2, BUN, ALBUMIN, ALKPHOS, ALT, AST, GLUCOSE in the last 168 hours.  Invalid input(s): CREAT, BILIRUBIN No results for input(s): INR in the last 168 hours.  Radiologic studies:   @ASSESSMENTPLANBEGIN @ Impression:  Esophageal food impaction   Plan:  EGD with relief of food impaction     Nelida Meuse III Pager: [phone] Mon-Fri 8a-5p [phone] after 5p, weekends, holidays

## 2015-09-03 ENCOUNTER — Encounter (HOSPITAL_COMMUNITY): Payer: Self-pay | Admitting: Internal Medicine

## 2015-09-03 DIAGNOSIS — T18128A Food in esophagus causing other injury, initial encounter: Secondary | ICD-10-CM

## 2015-09-03 DIAGNOSIS — R400 Somnolence: Secondary | ICD-10-CM

## 2015-09-03 DIAGNOSIS — E119 Type 2 diabetes mellitus without complications: Secondary | ICD-10-CM

## 2015-09-03 DIAGNOSIS — J438 Other emphysema: Secondary | ICD-10-CM

## 2015-09-03 DIAGNOSIS — I739 Peripheral vascular disease, unspecified: Secondary | ICD-10-CM

## 2015-09-03 DIAGNOSIS — I10 Essential (primary) hypertension: Secondary | ICD-10-CM

## 2015-09-03 DIAGNOSIS — Z794 Long term (current) use of insulin: Secondary | ICD-10-CM

## 2015-09-03 LAB — GLUCOSE, CAPILLARY
GLUCOSE-CAPILLARY: 52 mg/dL — AB (ref 65–99)
GLUCOSE-CAPILLARY: 202 mg/dL — AB (ref 65–99)
Glucose-Capillary: 72 mg/dL (ref 65–99)
Glucose-Capillary: 65 mg/dL (ref 65–99)
Glucose-Capillary: 211 mg/dL — ABNORMAL HIGH (ref 65–99)

## 2015-09-03 LAB — CBC WITH DIFFERENTIAL/PLATELET
BASOS ABS: 0.1 10*3/uL (ref 0.0–0.1)
Basophils Relative: 0 %
EOS PCT: 1 %
Eosinophils Absolute: 0.2 10*3/uL (ref 0.0–0.7)
HCT: 37 % (ref 36.0–46.0)
Hemoglobin: 11.7 g/dL — ABNORMAL LOW (ref 12.0–15.0)
LYMPHS ABS: 2.2 10*3/uL (ref 0.7–4.0)
LYMPHS PCT: 18 %
MCH: 26.2 pg (ref 26.0–34.0)
MCHC: 31.6 g/dL (ref 30.0–36.0)
MCV: 83 fL (ref 78.0–100.0)
MONO ABS: 0.6 10*3/uL (ref 0.1–1.0)
MONOS PCT: 5 %
Neutro Abs: 9.5 10*3/uL — ABNORMAL HIGH (ref 1.7–7.7)
Neutrophils Relative %: 76 %
PLATELETS: 317 10*3/uL (ref 150–400)
RBC: 4.46 MIL/uL (ref 3.87–5.11)
RDW: 14.7 % (ref 11.5–15.5)
WBC: 12.5 10*3/uL — ABNORMAL HIGH (ref 4.0–10.5)

## 2015-09-03 LAB — COMPREHENSIVE METABOLIC PANEL
ALBUMIN: 2.8 g/dL — AB (ref 3.5–5.0)
ALT: 10 U/L — ABNORMAL LOW (ref 14–54)
AST: 13 U/L — AB (ref 15–41)
Alkaline Phosphatase: 81 U/L (ref 38–126)
Anion gap: 7 (ref 5–15)
BILIRUBIN TOTAL: 0.6 mg/dL (ref 0.3–1.2)
BUN: 16 mg/dL (ref 6–20)
CO2: 29 mmol/L (ref 22–32)
Calcium: 8.5 mg/dL — ABNORMAL LOW (ref 8.9–10.3)
Chloride: 104 mmol/L (ref 101–111)
Creatinine, Ser: 1.02 mg/dL — ABNORMAL HIGH (ref 0.44–1.00)
GFR calc Af Amer: >60 mL/min (ref >60)
GFR calc non Af Amer: 55 mL/min — ABNORMAL LOW (ref >60)
GLUCOSE: 99 mg/dL (ref 65–99)
POTASSIUM: 3.1 mmol/L — AB (ref 3.5–5.1)
Sodium: 140 mmol/L (ref 135–145)
TOTAL PROTEIN: 6.6 g/dL (ref 6.5–8.1)

## 2015-09-03 MED ORDER — DEXTROSE 50 % IV SOLN
INTRAVENOUS | Status: AC
Start: 2015-09-03 — End: 2015-09-03
  Administered 2015-09-03: 50 mL
  Filled 2015-09-03: qty 50

## 2015-09-03 MED ORDER — CLOPIDOGREL BISULFATE 75 MG PO TABS
75.0000 mg | ORAL_TABLET | Freq: Every day | ORAL | Status: DC
  Administered 2015-09-03: 75 mg via ORAL
  Filled 2015-09-03: qty 1

## 2015-09-03 MED ORDER — PANTOPRAZOLE SODIUM 40 MG PO TBEC
40.0000 mg | DELAYED_RELEASE_TABLET | Freq: Every day | ORAL | Status: DC
  Administered 2015-09-03: 40 mg via ORAL
  Filled 2015-09-03: qty 1

## 2015-09-03 MED ORDER — FERROUS SULFATE 325 (65 FE) MG PO TABS
325.0000 mg | ORAL_TABLET | Freq: Two times a day (BID) | ORAL | Status: DC
  Administered 2015-09-03: 325 mg via ORAL
  Filled 2015-09-03: qty 1

## 2015-09-03 MED ORDER — POTASSIUM CHLORIDE CRYS ER 20 MEQ PO TBCR
60.0000 meq | EXTENDED_RELEASE_TABLET | Freq: Once | ORAL | Status: AC
  Administered 2015-09-03: 60 meq via ORAL
  Filled 2015-09-03: qty 3

## 2015-09-03 MED ORDER — GABAPENTIN 100 MG PO CAPS: 200.0000 mg | ORAL_CAPSULE | Freq: Every day | ORAL | Status: DC

## 2015-09-03 MED ORDER — INSULIN ASPART 100 UNIT/ML ~~LOC~~ SOLN
0.0000 [IU] | Freq: Three times a day (TID) | SUBCUTANEOUS | Status: DC
  Administered 2015-09-03: 3 [IU] via SUBCUTANEOUS

## 2015-09-03 MED ORDER — HYDROCODONE-ACETAMINOPHEN 10-325 MG PO TABS
1.0000 | ORAL_TABLET | ORAL | Status: DC | PRN
  Administered 2015-09-03: 1 via ORAL
  Filled 2015-09-03: qty 1

## 2015-09-03 MED ORDER — PROMETHAZINE HCL 25 MG/ML IJ SOLN: 6.2500 mg | INTRAMUSCULAR | Status: DC | PRN

## 2015-09-03 MED ORDER — DULOXETINE HCL 60 MG PO CPEP
60.0000 mg | ORAL_CAPSULE | Freq: Every day | ORAL | Status: DC
Start: 2015-09-03 — End: 2015-09-03
  Administered 2015-09-03: 60 mg via ORAL
  Filled 2015-09-03: qty 1

## 2015-09-03 MED ORDER — LEVOTHYROXINE SODIUM 25 MCG PO TABS
137.0000 ug | ORAL_TABLET | Freq: Every day | ORAL | Status: DC
  Administered 2015-09-03: 137 ug via ORAL
  Filled 2015-09-03: qty 1

## 2015-09-03 MED ORDER — ACETAMINOPHEN 650 MG RE SUPP: 650.0000 mg | Freq: Four times a day (QID) | RECTAL | Status: DC | PRN

## 2015-09-03 MED ORDER — PRAVASTATIN SODIUM 20 MG PO TABS
20.0000 mg | ORAL_TABLET | Freq: Every day | ORAL | Status: DC
  Administered 2015-09-03: 20 mg via ORAL
  Filled 2015-09-03: qty 1

## 2015-09-03 MED ORDER — HYDROMORPHONE HCL 1 MG/ML IJ SOLN: 0.2500 mg | INTRAMUSCULAR | Status: DC | PRN

## 2015-09-03 MED ORDER — ONDANSETRON HCL 4 MG/2ML IJ SOLN: 4.0000 mg | Freq: Four times a day (QID) | INTRAMUSCULAR | Status: DC | PRN

## 2015-09-03 MED ORDER — INSULIN ASPART 100 UNIT/ML ~~LOC~~ SOLN: 20.0000 [IU] | Freq: Three times a day (TID) | SUBCUTANEOUS | Status: DC

## 2015-09-03 MED ORDER — LORAZEPAM 1 MG PO TABS: 1.0000 mg | ORAL_TABLET | Freq: Two times a day (BID) | ORAL | Status: DC | PRN

## 2015-09-03 MED ORDER — ONDANSETRON HCL 4 MG PO TABS: 4.0000 mg | ORAL_TABLET | Freq: Four times a day (QID) | ORAL | Status: DC | PRN

## 2015-09-03 MED ORDER — ACETAMINOPHEN 325 MG PO TABS: 650.0000 mg | ORAL_TABLET | Freq: Four times a day (QID) | ORAL | Status: DC | PRN

## 2015-09-03 MED ORDER — INSULIN ASPART 100 UNIT/ML ~~LOC~~ SOLN
10.0000 [IU] | Freq: Three times a day (TID) | SUBCUTANEOUS | Status: DC
  Administered 2015-09-03: 10 [IU] via SUBCUTANEOUS

## 2015-09-03 MED ORDER — LISINOPRIL 5 MG PO TABS
5.0000 mg | ORAL_TABLET | Freq: Every day | ORAL | Status: DC
  Filled 2015-09-03: qty 1

## 2015-09-03 MED ORDER — NORTRIPTYLINE HCL 25 MG PO CAPS: 75.0000 mg | ORAL_CAPSULE | Freq: Every day | ORAL | Status: DC

## 2015-09-03 MED ORDER — FESOTERODINE FUMARATE ER 4 MG PO TB24
4.0000 mg | ORAL_TABLET | Freq: Every day | ORAL | Status: DC
  Filled 2015-09-03: qty 1

## 2015-09-03 NOTE — Care Management Obs Status (Signed)
[place] NOTIFICATION   Patient Details  Name: April Branch MRN: [id number] Date of Birth: [birth year]   Medicare Observation Status Notification Given:  Yes    Carles Collet, RN 09/03/2015, 11:41 AM

## 2015-09-03 NOTE — Progress Notes (Signed)
After arriving on the floor pt became a little lethargic. After checking sugar it was 52. I gave pt 240 ml of orange juice. After rechecking pt was only 65. Pt then received 25 ml of dextrose 50. After recheck was 211. Pt is alert and oriented x4. Will continue to monitor

## 2015-09-03 NOTE — Op Note (Signed)
[facility] Patient Name: April Branch Procedure Date : 09/02/2015 MRN: [id number] Attending MD: Estill Cotta. April Branch , MD Date of Birth: 01-Dec-1945 CSN: [id number] Age: 70 Admit Type: Outpatient Procedure:                Upper GI endoscopy Indications:              Foreign body (food impaction) in the esophagus                            causing complete obstruction Providers:                Mallie Mussel L. April Carrow, MD, Dortha Schwalbe, RN, William Dalton, Technician Referring MD:              Medicines:                General Anesthesia Complications:            No immediate complications. Estimated Blood Loss:     Estimated blood loss: none. Procedure:                Pre-Anesthesia Assessment:                           - Prior to the procedure, a History and Physical                            was performed, and patient medications and                            allergies were reviewed. The patient's tolerance of                            previous anesthesia was also reviewed. The risks                            and benefits of the procedure and the sedation                            options and risks were discussed with the patient.                            All questions were answered, and informed consent                            was obtained. Prior Anticoagulants: The patient has                            taken Plavix (clopidogrel), last dose was 1 day                            prior to procedure. ASA Grade Assessment: III - A  patient with severe systemic disease. After                            reviewing the risks and benefits, the patient was                            deemed in satisfactory condition to undergo the                            procedure.                           After obtaining informed consent, the endoscope was                            passed under direct vision. Throughout the                     procedure, the patient's blood pressure, pulse, and                            oxygen saturations were monitored continuously. The                            was introduced through the mouth, and advanced to                            the second part of duodenum. The upper GI endoscopy                            was extremely difficult due to presence of food.                            The patient tolerated the procedure well. Findings:      Food was found in the entire esophagus. At the [facility], a very large piece       of meat was completely impacted. After 1 hour and 50 minutes of effort       and expenditure of multiple devices, complete removal of food was       accomplished.      As previously described, the entire esophagus was of a narrow caliber       (approximately 10 mm), but without a focal stenosis/stricture. Mucosal       visualization was limited by food debris and edema, but there were no       furrows and no pseudo-trachealization.      The visualized stomach was normal.      The cardia and gastric fundus were normal on retroflexion.      The examined duodenum was normal. Impression:               - Food in the esophagus. Removal was successful.                           - Normal stomach.                           -  Normal examined duodenum. Moderate Sedation:      GETA Recommendation:           - Admit the patient to hospital ward for                            observation.                           - Mechanical soft diet. This patient should only                            ever consume small amounts of ground meat.                           - Continue present medications. Procedure Code(s):        --- Professional ---                           [phone], Esophagogastroduodenoscopy, flexible,                            transoral; with removal of foreign body(s) Diagnosis Code(s):        --- Professional ---                           [id number], Food in  esophagus causing other injury,                            initial encounter                           T18.108A, Unspecified foreign body in esophagus                            causing other injury, initial encounter CPT copyright 2016 American Medical Association. All rights reserved. The codes documented in this report are preliminary and upon coder review may  be revised to meet current compliance requirements. [name] L. April Carrow, MD 09/03/2015 1:22:10 AM This report has been signed electronically. Number of Addenda: 0

## 2015-09-03 NOTE — Transfer of Care (Signed)
Immediate Anesthesia Transfer of Care Note  Patient: April Branch  Procedure(s) Performed: Procedure(s): ESOPHAGOGASTRODUODENOSCOPY (EGD) WITH PROPOFOL (N/A)  Patient Location: PACU  Anesthesia Type:General  Level of Consciousness: awake, alert  and confused  Airway & Oxygen Therapy: Patient connected to nasal cannula oxygen  Post-op Assessment: Report given to RN, Post -op Vital signs reviewed and stable and Patient moving all extremities X 4  Post vital signs: Reviewed and stable  Last Vitals:  Filed Vitals:   09/02/15 2200 09/02/15 2253  BP: 124/51 158/56  Pulse: 64 67  Temp:    Resp:  18    Last Pain:  Filed Vitals:   09/03/15 0131  PainSc: 6          Complications: No apparent anesthesia complications

## 2015-09-03 NOTE — Care Management Note (Signed)
Case Management Note  Patient Details  Name: April Branch MRN: [id number] Date of Birth: January 29, 1946  Subjective/Objective:                 Patient in obs with esoph strictures. Will DC to home. Spoke with patient in the room, no CM needs identified.    Action/Plan:  DC to home today, will have support from family at DC.  Expected Discharge Date:                  Expected Discharge Plan:  Home/Self Care  In-House Referral:     Discharge planning Services  CM Consult  Post Acute Care Choice:    Choice offered to:     DME Arranged:    DME Agency:     HH Arranged:    [place] Agency:     Status of Service:  Completed, signed off  If discussed at [employer] of Stay Meetings, dates discussed:    Additional Comments:  Carles Collet, RN 09/03/2015, 11:42 AM

## 2015-09-03 NOTE — H&P (Signed)
History and Physical    April Branch [id number] DOB: 08-10-1945 DOA: 09/02/2015  PCP: [name], MD  Patient coming from: Home.  Chief Complaint: Foot impaction in the esophagus.  HPI: April Branch is a 70 y.o. female with diabetes mellitus type 2, hypertension, hyperlipidemia, hypothyroidism was brought to the ER after patient had food stuck in the esophagus since yesterday. Patient was taken for emergency EGD and foot particle removed. Since then patient has been feeling drowsy so gastroenterologist requested hospitalist admission for further observation. On my exam patient is mildly drowsy but oriented to time place and person. Denies any chest pain or shortness of breath.   ED Course: Patient was directly admitted from the EGD suite.  Review of Systems: As per HPI, rest all negative.   Past Medical History  Diagnosis Date  . Hyperlipidemia   . Arthritis   . Hypothyroidism   . GERD (gastroesophageal reflux disease)   . Anxiety   . Diabetes mellitus age 69    insulin dependent  . Peripheral vascular disease ([place])   . Lumbar disc disease   . Insulin dependent type 2 diabetes mellitus, controlled ([place])   . GERD 07/20/2006  . CAD (coronary artery disease)   . CAD (coronary artery disease) of bypass graft   . Vertigo   . History of transient ischemic attack (TIA)   . Myocardial infarction ([place])     AGE 14    . Hypertension   . Depression   . COPD 12/14/2008    PATIENT DENIES    . Personal history of malignant neoplasm of kidney(V10.52) 12/14/2008    Laparoscopic biopsy and cryoablation 7/08 Dr. Vernie Shanks   . Headache(784.0)   . Neuromuscular disorder ([place])     PERIPHERAL NEUROPATHY  . Anemia   . Diabetic coma ([place]) Feb. 2014  . Fall at home Jan. 2, 2016  Jan. 13, 2016    Left knee  . Anginal pain ([place])     1 MONTH AGO   . Stroke [facility])     3 MINI STROKES  RT SIDED WEAKNESS  . Fibromyalgia   . HPV (human papilloma virus) infection   . Colon polyps  09/11/2010    Tubular adenoma  . Cancer 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 Appendectomy    . Abdominal hysterectomy    . Knee arthroscopy Bilateral     "cartilage"  . Lung surgery Right     lung nodule removed from the right side  . Laparascopic cryoablation of left kidney  08/2006    Dr. Gladis Riffle for renal cell cancer  . Bladder suspension    . Shoulder arthroscopy Bilateral     'Spurs"  . Foot surgery Left     "bone spurs"  . Pr vein bypass graft,aorto-fem-pop  05/27/2010  . Cardiac catheterization    . Femoral-popliteal bypass graft  10/12/2011    Procedure: BYPASS GRAFT FEMORAL-POPLITEAL ARTERY;  Surgeon: Elam Dutch, MD;  Location: [facility] OR;  Service: Vascular;  Laterality: Left;  Left Femoral-Popliteal Bypass Graft using 30mm x 80cm Propaten Graft with intraop arteriogram times one.  . Intraoperative arteriogram  10/12/2011    Procedure: INTRA OPERATIVE ARTERIOGRAM;  Surgeon: Elam Dutch, MD;  Location: [place];  Service: Vascular;  Laterality: Left;  . Left heart catheterization with coronary angiogram N/A 05/11/2011    Procedure: LEFT HEART CATHETERIZATION WITH CORONARY  Cyril Loosen;  Surgeon: Jolaine Artist, MD;  Location: [facility] CATH LAB;  Service: Cardiovascular;  Laterality: N/A;  . Abdominal aortagram N/A 08/21/2011    Procedure: ABDOMINAL Maxcine Ham;  Surgeon: Elam Dutch, MD;  Location: [facility] CATH LAB;  Service: Cardiovascular;  Laterality: N/A;  . Abdominal aortagram N/A 03/16/2014    Procedure: ABDOMINAL Maxcine Ham;  Surgeon: Elam Dutch, MD;  Location: [facility] CATH LAB;  Service: Cardiovascular;  Laterality: N/A;  . Femoral-popliteal bypass graft Left 04/17/2014    Procedure: REDO LEFT FEMORAL-POPLITEAL ARTERY BYPASS USING GORE PROPATEN 56mmx80cm GRAFT;  Surgeon: Elam Dutch, MD;  Location: [place];  Service: Vascular;  Laterality: Left;  . Esophagogastroduodenoscopy (egd) with  propofol N/A 05/16/2014    Procedure: ESOPHAGOGASTRODUODENOSCOPY (EGD) WITH PROPOFOL with Balloon dilation;  Surgeon: Milus Banister, MD;  Location: [place];  Service: Endoscopy;  Laterality: N/A;  . Cholecystectomy open    . Tubal ligation       reports that she quit smoking about 14 months ago. Her smoking use included Cigarettes. She has a 12.5 pack-year smoking history. She has never used smokeless tobacco. She reports that she does not drink alcohol or use illicit drugs.  No Known Allergies  Family History  Problem Relation Age of Onset  . Heart disease Mother   . Lung cancer Mother   . Cancer Mother     Lung  . Heart disease Father   . Lung cancer Father   . Cancer Father     Lung  . Heart disease Sister     CABG- Open Heart    Prior to Admission medications   Medication Sig Start Date End Date Taking? Authorizing Provider  clopidogrel (PLAVIX) 75 MG tablet Take 75 mg by mouth daily.  04/02/14  Yes Historical Provider, MD  DULoxetine (CYMBALTA) 60 MG capsule TAKE ONE CAPSULE BY MOUTH EVERY DAY 08/05/15  Yes Elayne Snare, MD  ferrous sulfate 325 (65 FE) MG tablet Take 325 mg by mouth 2 (two) times daily with a meal.   Yes Historical Provider, MD  gabapentin (NEURONTIN) 100 MG capsule Take 200 mg by mouth at bedtime.  01/07/15  Yes Historical Provider, MD  HYDROcodone-acetaminophen (NORCO) 10-325 MG tablet Take 1 tablet by mouth every 4 (four) hours as needed for moderate pain or severe pain. 06/28/15  Yes Elwin Mocha, MD  insulin lispro (HUMALOG KWIKPEN) 100 UNIT/ML KiwkPen Inject 0.2 mLs (20 Units total) into the skin 3 (three) times daily. 07/19/15  Yes Elayne Snare, MD  KLOR-CON 10 10 MEQ tablet TAKE 1 TABLET BY MOUTH EVERY DAY 08/05/15  Yes Elayne Snare, MD  lansoprazole (PREVACID) 30 MG capsule Take 1 capsule (30 mg total) by mouth 2 (two) times daily before a meal. Patient taking differently: Take 30 mg by mouth daily.  05/16/14  Yes Milus Banister, MD  levothyroxine  (SYNTHROID, LEVOTHROID) 137 MCG tablet TAKE 1 TABLET (137 MCG TOTAL) BY MOUTH DAILY BEFORE BREAKFAST. 08/05/15  Yes Elayne Snare, MD  lisinopril (PRINIVIL,ZESTRIL) 5 MG tablet TAKE 1 TABLET (5 MG TOTAL) BY MOUTH DAILY. 04/22/15  Yes Elayne Snare, MD  LORazepam (ATIVAN) 1 MG tablet Take 1 tablet (1 mg total) by mouth 3 times/day as needed-between meals & bedtime for anxiety. 06/28/15  Yes Elwin Mocha, MD  metFORMIN (GLUCOPHAGE-XR) 750 MG 24 hr tablet TAKE 2 TABLETS BY MOUTH EVERY DAY 08/14/15  Yes Elayne Snare, MD  nortriptyline (PAMELOR) 75 MG capsule Take 75 mg by mouth at bedtime. 01/24/14  Yes  Historical Provider, MD  pravastatin (PRAVACHOL) 20 MG tablet Take 20 mg by mouth daily.   Yes Historical Provider, MD  tolterodine (DETROL LA) 4 MG 24 hr capsule Take 4 mg by mouth daily.    Yes Historical Provider, MD  TOUJEO SOLOSTAR 300 UNIT/ML SOPN INJECT 60 UNITS INTO THE SKIN 2 TIMES DAILY. 08/02/15  Yes Elayne Snare, MD  ACCU-CHEK AVIVA PLUS test strip  01/24/14   Historical Provider, MD  ACCU-CHEK FASTCLIX LANCETS MISC Use 4 per day to check blood sugar dx code E11.9 11/29/14   Elayne Snare, MD  Alcohol Swabs (B-D SINGLE USE SWABS REGULAR) PADS  03/21/13   Historical Provider, MD  Insulin Pen Needle (BD PEN NEEDLE NANO U/F) 32G X 4 MM MISC Use to inject insulin 10/30/14   Elayne Snare, MD  NOVOLOG FLEXPEN 100 UNIT/ML FlexPen INJECT 20 UNITS INTO THE SKIN 3 TIMES A DAY WITH MEALS Patient not taking: Reported on 09/02/2015 07/19/15   Elayne Snare, MD    Physical Exam: Filed Vitals:   09/03/15 0143 09/03/15 0155 09/03/15 0210 09/03/15 0300  BP: 113/59 100/77 123/49   Pulse: 64 60 55   Temp:  97.5 F (36.4 C) 98.2 F (36.8 C)   TempSrc:   Oral   Resp: 15 20 18    Height:    5\' 1"  (1.549 m)  Weight:    169 lb 15.6 oz (77.1 kg)  SpO2: 96% 99% 95%       Constitutional: Not in distress. Filed Vitals:   09/03/15 0143 09/03/15 0155 09/03/15 0210 09/03/15 0300  BP: 113/59 100/77 123/49   Pulse: 64 60 55   Temp:   97.5 F (36.4 C) 98.2 F (36.8 C)   TempSrc:   Oral   Resp: 15 20 18    Height:    5\' 1"  (1.549 m)  Weight:    169 lb 15.6 oz (77.1 kg)  SpO2: 96% 99% 95%    Eyes: Anicteric no pallor. ENMT: No discharge from the ears eyes nose or mouth. Neck: No mass felt. No neck rigidity. Respiratory: No rhonchi or crepitations. Cardiovascular: S1 and S2 heard. Abdomen: Soft nontender bowel sounds present. Musculoskeletal: No edema. Skin: No rash. Neurologic: Mildly drowsy but oriented to time place and person. Moves all extremities. Psychiatric: Appears normal.   Labs on Admission: I have personally reviewed following labs and imaging studies  CBC: No results for input(s): WBC, NEUTROABS, HGB, HCT, MCV, PLT in the last 168 hours. Basic Metabolic Panel: No results for input(s): NA, K, CL, CO2, GLUCOSE, BUN, CREATININE, CALCIUM, MG, PHOS in the last 168 hours. GFR: CrCl cannot be calculated (Patient has no serum creatinine result on file.). Liver Function Tests: No results for input(s): AST, ALT, ALKPHOS, BILITOT, PROT, ALBUMIN in the last 168 hours. No results for input(s): LIPASE, AMYLASE in the last 168 hours. No results for input(s): AMMONIA in the last 168 hours. Coagulation Profile: No results for input(s): INR, PROTIME in the last 168 hours. Cardiac Enzymes: No results for input(s): CKTOTAL, CKMB, CKMBINDEX, TROPONINI in the last 168 hours. BNP (last 3 results) No results for input(s): PROBNP in the last 8760 hours. HbA1C: No results for input(s): HGBA1C in the last 72 hours. CBG:  Recent Labs Lab 09/03/15 0130 09/03/15 0309 09/03/15 0338  GLUCAP 72 52* 65   Lipid Profile: No results for input(s): CHOL, HDL, LDLCALC, TRIG, CHOLHDL, LDLDIRECT in the last 72 hours. Thyroid Function Tests: No results for input(s): TSH, T4TOTAL, FREET4, T3FREE, THYROIDAB in the  last 72 hours. Anemia Panel: No results for input(s): VITAMINB12, FOLATE, FERRITIN, TIBC, IRON, RETICCTPCT in the  last 72 hours. Urine analysis:    Component Value Date/Time   COLORURINE STRAW* 06/24/2015 2040   APPEARANCEUR CLEAR 06/24/2015 2040   LABSPEC 1.024 06/24/2015 2040   PHURINE 5.0 06/24/2015 2040   GLUCOSEU >1000* 06/24/2015 2040   HGBUR SMALL* 06/24/2015 2040   BILIRUBINUR NEGATIVE 06/24/2015 2040   KETONESUR NEGATIVE 06/24/2015 2040   PROTEINUR NEGATIVE 06/24/2015 2040   UROBILINOGEN 1.0 04/10/2014 1038   NITRITE NEGATIVE 06/24/2015 2040   LEUKOCYTESUR NEGATIVE 06/24/2015 2040   Sepsis Labs: @LABRCNTIP (procalcitonin:4,lacticidven:4) )No results found for this or any previous visit (from the past 240 hour(s)).   Radiological Exams on Admission: Dg Chest 2 View  09/02/2015  CLINICAL DATA:  Evaluate foreign body. EXAM: CHEST  2 VIEW COMPARISON:  06/24/2015 FINDINGS: There is bilateral mild chronic interstitial thickening. There is no focal parenchymal opacity. There is no pleural effusion or pneumothorax. The heart and mediastinal contours are unremarkable. The osseous structures are unremarkable. IMPRESSION: No active cardiopulmonary disease. Electronically Signed   By: Kathreen Devoid   On: 09/02/2015 18:38     Assessment/Plan Principal Problem:   Drowsiness Active Problems:   Hypothyroidism   Insulin dependent type 2 diabetes mellitus, controlled ([place])   Essential hypertension   COPD (chronic obstructive pulmonary disease) ([facility])   PAD (peripheral artery disease) ([place])   Food impaction of esophagus    1. Drowsiness probably from sedation during EGD - appears to be improving continue to observe. 2. Food impaction status post EGD and removal - for now I have placed patient on full liquid diet. 3. Diabetes mellitus type 2 - hold metformin while inpatient, patient is on NovoLog pre-meal schedule dose of which have decreased due to patient being on full liquid diet. 4. Hypertension on lisinopril. 5. Hyperlipidemia on statins. 6. Peripheral arterial disease on antiplatelet  agents.  Labs are pending.   DVT prophylaxis: SCDs. Code Status: Full code.  Family Communication: No family at the bedside.  Disposition Plan: Home.  Consults called: None.  Admission status: Observation. MedSurg.    Rise Patience MD [name] Pager [phone].  If 7PM-7AM, please contact night-coverage [email or web address] Password [email or web address]  09/03/2015, 4:31 AM

## 2015-09-03 NOTE — Progress Notes (Signed)
Patient/Family oriented to room. Information packet given to patient/family. Admission inpatient armband information verified with patient/family to include name and date of birth and placed on patient arm. Side rails up x 2, fall assessment and education completed with patient/family. Call light within reach. Patient/family able to voice and demonstrate understanding of unit orientation instructions  Dr.Kakrakandy made aware/

## 2015-09-03 NOTE — Progress Notes (Signed)
Daily Rounding Note  09/03/2015, 8:25 AM    SUBJECTIVE:       Pt tolerating full liquid diet but has pain in region of lower esophagus, worse with swallowing but not severe.  OBJECTIVE:         Vital signs in last 24 hours:    Temp:  [97.5 F (36.4 C)-98.5 F (36.9 C)] 98.5 F (36.9 C) (07/11 0541) Pulse Rate:  [55-75] 57 (07/11 0541) Resp:  [15-28] 16 (07/11 0541) BP: (97-158)/(44-128) 124/56 mmHg (07/11 0541) SpO2:  [92 %-100 %] 92 % (07/11 0541) Weight:  [77.1 kg (169 lb 15.6 oz)] 77.1 kg (169 lb 15.6 oz) (07/11 0300) Last BM Date: 09/02/15 Filed Weights   09/03/15 0300  Weight: 77.1 kg (169 lb 15.6 oz)   General: looks well.     Heart: RRR Chest: clear bil.  No cough or dyspnea Abdomen: soft, active BS.  NT, ND  Extremities: no CCE Neuro/Psych:  Oriented x 3.  Calm.  Appropriate.  No tremor or gross deficits.   Intake/Output from previous day: 07/10 0701 - 07/11 0700 In: 1140 [P.O.:240; I.V.:900] Out: -   Intake/Output this shift:    Lab Results:  Recent Labs  09/03/15 0725  WBC 12.5*  HGB 11.7*  HCT 37.0  PLT 317   BMET No results for input(s): NA, K, CL, CO2, GLUCOSE, BUN, CREATININE, CALCIUM in the last 72 hours. LFT No results for input(s): PROT, ALBUMIN, AST, ALT, ALKPHOS, BILITOT, BILIDIR, IBILI in the last 72 hours. PT/INR No results for input(s): LABPROT, INR in the last 72 hours. Hepatitis Panel No results for input(s): HEPBSAG, HCVAB, HEPAIGM, HEPBIGM in the last 72 hours.  Studies/Results: Dg Chest 2 View  09/02/2015  CLINICAL DATA:  Evaluate foreign body. EXAM: CHEST  2 VIEW COMPARISON:  06/24/2015 FINDINGS: There is bilateral mild chronic interstitial thickening. There is no focal parenchymal opacity. There is no pleural effusion or pneumothorax. The heart and mediastinal contours are unremarkable. The osseous structures are unremarkable. IMPRESSION: No active cardiopulmonary  disease. Electronically Signed   By: Kathreen Devoid   On: 09/02/2015 18:38   Scheduled Meds: . clopidogrel  75 mg Oral Daily  . DULoxetine  60 mg Oral Daily  . ferrous sulfate  325 mg Oral BID WC  . fesoterodine  4 mg Oral Daily  . gabapentin  200 mg Oral QHS  . insulin aspart  0-9 Units Subcutaneous TID WC  . insulin aspart  10 Units Subcutaneous TID WC  . levothyroxine  137 mcg Oral QAC breakfast  . lisinopril  5 mg Oral Daily  . nortriptyline  75 mg Oral QHS  . pantoprazole  40 mg Oral Daily  . pravastatin  20 mg Oral Daily   Continuous Infusions:  PRN Meds:.acetaminophen **OR** acetaminophen, HYDROcodone-acetaminophen, LORazepam, ondansetron **OR** ondansetron (ZOFRAN) IV   ASSESMENT:   *  Food impaction. Previous hx food impactions and EGDs with baloon dilatation.  S/p 09/02/15 EGD with FB removal (large piece of meat).  Esophagus narrow but no stricture. MD did not perform dilation.    *  Hx diverticulosis and adenomatous colon polyps 2012.  Due for surveillance colonoscopy 08/2015.  Letter sent 07/2015.   *  IDDM.  Diabetic foot ulcers. Diabetic neuropathy.    *  ASPVD s/p 2 previous left fempop BPG. On chronic Plavix, not on hold.   *  CKD.    *  Anemia of CKD.  On po Iron.  PLAN   *   Arrange ROV with GI in a month or so at which time decision re re endoscope and colonoscopy will be finalized.  Office schedule not yet finalized but GI will call her to arrange follow up.   She will need off Plavix for 5 days before a colonoscopy.  Stick to soft, well cooked or ground foods.  Reminded pt that, in future, when she begins to experience dysphagia, she ought to call GI to arrange OV so that EGD can be arranged.  She is ok for discharge.      Azucena Freed  09/03/2015, 8:25 AM Pager: [phone]

## 2015-09-03 NOTE — Anesthesia Postprocedure Evaluation (Signed)
Anesthesia Post Note  Patient: April Branch  Procedure(s) Performed: Procedure(s) (LRB): ESOPHAGOGASTRODUODENOSCOPY (EGD) WITH PROPOFOL (N/A)  Patient location during evaluation: PACU Anesthesia Type: General Level of consciousness: sedated Pain management: pain level controlled Vital Signs Assessment: post-procedure vital signs reviewed and stable Respiratory status: spontaneous breathing and respiratory function stable Cardiovascular status: stable Anesthetic complications: no    Last Vitals:  Filed Vitals:   09/03/15 0155 09/03/15 0210  BP: 100/77 123/49  Pulse: 60 55  Temp: 36.4 C 36.8 C  Resp: 20 18    Last Pain:  Filed Vitals:   09/03/15 0210  PainSc: [place]

## 2015-09-03 NOTE — Discharge Summary (Signed)
Physician Discharge Summary  April Branch [id number] DOB: 08-06-1945 DOA: 09/02/2015  PCP: [name], MD  Admit date: 09/02/2015 Discharge date: 09/03/2015  Admitted From: Home  Disposition:  Home  Recommendations for Outpatient Follow-up:  1. Follow up with PCP in 1-2 weeks 2. Please obtain BMP/CBC in one week  Home Health: None Equipment/Devices: None  Discharge Condition:Stable CODE STATUS: Full code Diet recommendation: Heart Healthy / Carb Modified, instructed to eat soft diet.  Brief/Interim Summary: April Branch is a 70 y.o. female with diabetes mellitus type 2, hypertension, hyperlipidemia, hypothyroidism was brought to the ER after patient had food stuck in the esophagus since yesterday. Patient was taken for emergency EGD and foot particle removed. Since then patient has been feeling drowsy so gastroenterologist requested hospitalist admission for further observation. On my exam patient is mildly drowsy but oriented to time place and person. Denies any chest pain or shortness of breath.   Discharge Diagnoses:  Principal Problem:   Drowsiness Active Problems:   Hypothyroidism   Insulin dependent type 2 diabetes mellitus, controlled (HCC)   Essential hypertension   COPD (chronic obstructive pulmonary disease) (HCC)   PAD (peripheral artery disease) (HCC)   Food impaction of esophagus  1. Drowsiness probably from sedation during EGD, this is resolved completely. 2. Food impaction status post EGD and removal, feels she is back to her baseline, instructed to avoid chunky food and stick to soft diet. 3. Diabetes mellitus type 2 home regimen restarted. 4. Hypertension on lisinopril. 5. Hyperlipidemia on statins. 6. Peripheral arterial disease on antiplatelet agents.  Discharge Instructions  Discharge Instructions    Diet - low sodium heart healthy    Complete by:  As directed      Increase activity slowly    Complete by:  As directed              Medication List    TAKE these medications        ACCU-CHEK AVIVA PLUS test strip  Generic drug:  glucose blood     ACCU-CHEK FASTCLIX LANCETS Misc  Use 4 per day to check blood sugar dx code E11.9     B-D SINGLE USE SWABS REGULAR Pads     clopidogrel 75 MG tablet  Commonly known as:  PLAVIX  Take 75 mg by mouth daily.     DULoxetine 60 MG capsule  Commonly known as:  CYMBALTA  TAKE ONE CAPSULE BY MOUTH EVERY DAY     ferrous sulfate 325 (65 FE) MG tablet  Take 325 mg by mouth 2 (two) times daily with a meal.     gabapentin 100 MG capsule  Commonly known as:  NEURONTIN  Take 200 mg by mouth at bedtime.     HYDROcodone-acetaminophen 10-325 MG tablet  Commonly known as:  NORCO  Take 1 tablet by mouth every 4 (four) hours as needed for moderate pain or severe pain.     insulin lispro 100 UNIT/ML KiwkPen  Commonly known as:  HUMALOG KWIKPEN  Inject 0.2 mLs (20 Units total) into the skin 3 (three) times daily.     Insulin Pen Needle 32G X 4 MM Misc  Commonly known as:  BD PEN NEEDLE NANO U/F  Use to inject insulin     KLOR-CON 10 10 MEQ tablet  Generic drug:  potassium chloride  TAKE 1 TABLET BY MOUTH EVERY DAY     lansoprazole 30 MG capsule  Commonly known as:  PREVACID  Take 1 capsule (30 mg total) by mouth  2 (two) times daily before a meal.     levothyroxine 137 MCG tablet  Commonly known as:  SYNTHROID, LEVOTHROID  TAKE 1 TABLET (137 MCG TOTAL) BY MOUTH DAILY BEFORE BREAKFAST.     lisinopril 5 MG tablet  Commonly known as:  PRINIVIL,ZESTRIL  TAKE 1 TABLET (5 MG TOTAL) BY MOUTH DAILY.     LORazepam 1 MG tablet  Commonly known as:  ATIVAN  Take 1 tablet (1 mg total) by mouth 3 times/day as needed-between meals & bedtime for anxiety.     metFORMIN 750 MG 24 hr tablet  Commonly known as:  GLUCOPHAGE-XR  TAKE 2 TABLETS BY MOUTH EVERY DAY     nortriptyline 75 MG capsule  Commonly known as:  PAMELOR  Take 75 mg by mouth at bedtime.     NOVOLOG FLEXPEN 100  UNIT/ML FlexPen  Generic drug:  insulin aspart  INJECT 20 UNITS INTO THE SKIN 3 TIMES A DAY WITH MEALS     pravastatin 20 MG tablet  Commonly known as:  PRAVACHOL  Take 20 mg by mouth daily.     tolterodine 4 MG 24 hr capsule  Commonly known as:  DETROL LA  Take 4 mg by mouth daily.     TOUJEO SOLOSTAR 300 UNIT/ML Sopn  Generic drug:  Insulin Glargine  INJECT 60 UNITS INTO THE SKIN 2 TIMES DAILY.           Follow-up Information    Call Milus Banister, MD.   Specialty:  Gastroenterology   Why:  office plans to call you to arrange follow up visit, but if you do not get a call within 2 weeks, please call the office.    Contact information:   [address] [address] [phone]      No Known Allergies  Consultations:  Gastroenterology   Procedures/Studies: Dg Chest 2 View  09/02/2015  CLINICAL DATA:  Evaluate foreign body. EXAM: CHEST  2 VIEW COMPARISON:  06/24/2015 FINDINGS: There is bilateral mild chronic interstitial thickening. There is no focal parenchymal opacity. There is no pleural effusion or pneumothorax. The heart and mediastinal contours are unremarkable. The osseous structures are unremarkable. IMPRESSION: No active cardiopulmonary disease. Electronically Signed   By: Kathreen Devoid   On: 09/02/2015 18:38    (Echo, Carotid, EGD, Colonoscopy, ERCP)    Subjective:   Discharge Exam: Filed Vitals:   09/03/15 0541 09/03/15 0830  BP: 124/56 100/51  Pulse: 57 60  Temp: 98.5 F (36.9 C)   Resp: 16    Filed Vitals:   09/03/15 0210 09/03/15 0300 09/03/15 0541 09/03/15 0830  BP: 123/49  124/56 100/51  Pulse: 55  57 60  Temp: 98.2 F (36.8 C)  98.5 F (36.9 C)   TempSrc: Oral  Oral   Resp: 18  16   Height:  5\' 1"  (1.549 m)    Weight:  77.1 kg (169 lb 15.6 oz)    SpO2: 95%  92%     General: Pt is alert, awake, not in acute distress Cardiovascular: RRR, S1/S2 +, no rubs, no gallops Respiratory: CTA bilaterally, no wheezing, no  rhonchi Abdominal: Soft, NT, ND, bowel sounds + Extremities: no edema, no cyanosis    The results of significant diagnostics from this hospitalization (including imaging, microbiology, ancillary and laboratory) are listed below for reference.     Microbiology: No results found for this or any previous visit (from the past 240 hour(s)).   Labs: BNP (last 3 results) No results  for input(s): BNP in the last 8760 hours. Basic Metabolic Panel:  Recent Labs Lab 09/03/15 0725  NA 140  K 3.1*  CL 104  CO2 29  GLUCOSE 99  BUN 16  CREATININE 1.02*  CALCIUM 8.5*   Liver Function Tests:  Recent Labs Lab 09/03/15 0725  AST 13*  ALT 10*  ALKPHOS 81  BILITOT 0.6  PROT 6.6  ALBUMIN 2.8*   No results for input(s): LIPASE, AMYLASE in the last 168 hours. No results for input(s): AMMONIA in the last 168 hours. CBC:  Recent Labs Lab 09/03/15 0725  WBC 12.5*  NEUTROABS 9.5*  HGB 11.7*  HCT 37.0  MCV 83.0  PLT 317   Cardiac Enzymes: No results for input(s): CKTOTAL, CKMB, CKMBINDEX, TROPONINI in the last 168 hours. BNP: Invalid input(s): POCBNP CBG:  Recent Labs Lab 09/03/15 0130 09/03/15 0309 09/03/15 0338 09/03/15 0431  GLUCAP 72 52* 65 211*   D-Dimer No results for input(s): DDIMER in the last 72 hours. Hgb A1c No results for input(s): HGBA1C in the last 72 hours. Lipid Profile No results for input(s): CHOL, HDL, LDLCALC, TRIG, CHOLHDL, LDLDIRECT in the last 72 hours. Thyroid function studies No results for input(s): TSH, T4TOTAL, T3FREE, THYROIDAB in the last 72 hours.  Invalid input(s): FREET3 Anemia work up No results for input(s): VITAMINB12, FOLATE, FERRITIN, TIBC, IRON, RETICCTPCT in the last 72 hours. Urinalysis    Component Value Date/Time   COLORURINE STRAW* 06/24/2015 2040   APPEARANCEUR CLEAR 06/24/2015 2040   LABSPEC 1.024 06/24/2015 2040   PHURINE 5.0 06/24/2015 2040   GLUCOSEU >1000* 06/24/2015 2040   HGBUR SMALL* 06/24/2015 2040    BILIRUBINUR NEGATIVE 06/24/2015 2040   KETONESUR NEGATIVE 06/24/2015 2040   PROTEINUR NEGATIVE 06/24/2015 2040   UROBILINOGEN 1.0 04/10/2014 1038   NITRITE NEGATIVE 06/24/2015 2040   LEUKOCYTESUR NEGATIVE 06/24/2015 2040   Sepsis Labs Invalid input(s): PROCALCITONIN,  WBC,  LACTICIDVEN Microbiology No results found for this or any previous visit (from the past 240 hour(s)).   Time coordinating discharge: Over 30 minutes  SIGNED:   Birdie Hopes, MD  [name] 09/03/2015, 11:33 AM Pager   If 7PM-7AM, please contact night-coverage [email or web address] Password [email or web address]

## 2015-09-04 ENCOUNTER — Encounter (HOSPITAL_COMMUNITY): Payer: Self-pay | Admitting: Gastroenterology

## 2015-09-10 ENCOUNTER — Encounter: Payer: Self-pay | Admitting: Gastroenterology

## 2015-09-16 ENCOUNTER — Telehealth: Payer: Self-pay | Admitting: Endocrinology

## 2015-09-16 NOTE — Telephone Encounter (Signed)
Tammy from [facility] need a updated A1C and B/P .[phone]

## 2015-09-19 LAB — HM DIABETES EYE EXAM

## 2015-09-21 ENCOUNTER — Other Ambulatory Visit: Payer: Self-pay | Admitting: Endocrinology

## 2015-10-01 ENCOUNTER — Other Ambulatory Visit: Payer: Self-pay | Admitting: Endocrinology

## 2015-10-03 ENCOUNTER — Ambulatory Visit: Payer: Medicare Other | Admitting: Physician Assistant

## 2015-10-21 ENCOUNTER — Other Ambulatory Visit: Payer: Medicare Other

## 2015-10-21 ENCOUNTER — Ambulatory Visit: Payer: Medicare Other | Admitting: Physician Assistant

## 2015-10-22 ENCOUNTER — Other Ambulatory Visit: Payer: Medicare Other

## 2015-10-23 ENCOUNTER — Telehealth: Payer: Self-pay | Admitting: Endocrinology

## 2015-10-23 ENCOUNTER — Ambulatory Visit: Payer: Medicare Other | Admitting: Physician Assistant

## 2015-10-23 ENCOUNTER — Other Ambulatory Visit (INDEPENDENT_AMBULATORY_CARE_PROVIDER_SITE_OTHER): Payer: Medicare Other

## 2015-10-23 DIAGNOSIS — Z794 Long term (current) use of insulin: Secondary | ICD-10-CM | POA: Diagnosis not present

## 2015-10-23 DIAGNOSIS — E1165 Type 2 diabetes mellitus with hyperglycemia: Secondary | ICD-10-CM | POA: Diagnosis not present

## 2015-10-23 LAB — BASIC METABOLIC PANEL
BUN: 18 mg/dL (ref 6–23)
CHLORIDE: 104 meq/L (ref 96–112)
CO2: 26 meq/L (ref 19–32)
Calcium: 8.9 mg/dL (ref 8.4–10.5)
Creatinine, Ser: 1.34 mg/dL — ABNORMAL HIGH (ref 0.40–1.20)
GFR: 41.78 mL/min — ABNORMAL LOW (ref >60.00)
GLUCOSE: 199 mg/dL — AB (ref 70–99)
POTASSIUM: 4.1 meq/L (ref 3.5–5.1)
SODIUM: 137 meq/L (ref 135–145)

## 2015-10-23 NOTE — Telephone Encounter (Signed)
I contacted Opal Sidles with [facility] and provided information that was needed.

## 2015-10-23 NOTE — Telephone Encounter (Signed)
Whitney from [facility] need patient last B/P and A1C [phone]

## 2015-10-24 ENCOUNTER — Encounter: Payer: Self-pay | Admitting: Endocrinology

## 2015-10-24 ENCOUNTER — Ambulatory Visit (INDEPENDENT_AMBULATORY_CARE_PROVIDER_SITE_OTHER): Payer: Medicare Other | Admitting: Endocrinology

## 2015-10-24 VITALS — BP 122/80 | HR 72 | Temp 98.6°F | Wt 169.0 lb

## 2015-10-24 DIAGNOSIS — Z794 Long term (current) use of insulin: Secondary | ICD-10-CM | POA: Diagnosis not present

## 2015-10-24 DIAGNOSIS — E1165 Type 2 diabetes mellitus with hyperglycemia: Secondary | ICD-10-CM | POA: Diagnosis not present

## 2015-10-24 DIAGNOSIS — E038 Other specified hypothyroidism: Secondary | ICD-10-CM

## 2015-10-24 LAB — POCT GLYCOSYLATED HEMOGLOBIN (HGB A1C): HEMOGLOBIN A1C: 7.6

## 2015-10-24 NOTE — Patient Instructions (Addendum)
Check blood sugars on waking up  3x per week  Also check blood sugars about 2 hours after a meal and do this after different meals by rotation  Recommended blood sugar levels on waking up is 90-130 and about 2 hours after meal is 130-160  Please bring your blood sugar monitor to each visit, thank you  HUMALOG: Take 15 at lunch and 20 at supper  Stop HCTZ

## 2015-10-24 NOTE — Progress Notes (Signed)
Patient ID: April Branch, female   DOB: 03-Jul-1945, 70 y.o.   MRN: [id number]           Reason for Appointment: Follow-up  for Type 2 Diabetes  Referring physician: Pattricia Boss  History of Present Illness:          Date of diagnosis of type 2 diabetes mellitus: 1980s       Previous history:    She thinks she was treated with metformin for some time before she went on insulin in ?  2002   She has mostly been treated with Lantus and rapid acting insulin such as Apidra or Novolog.   She thinks that Apidra worked fairly fast but insurance did not cover this, generally has had poor control   Recent history:   INSULIN regimen is described as:   Toujeo 60 units in am, 10-15 Novolog before meals  On her initial consultation she was switched from Lantus to [facility] because of blood sugars mostly over 300 Was also started on metformin ER 750 mg, 2 tablets daily initially and  in 3/17 she was started on Invokana 100 mg daily because of  poor control  Her A1c on the last visit was 8.4, now improved at 7.6  Current blood sugar patterns and problems identified:  FASTING blood sugars are being checked only sporadically with only 4 readings recently and are quite variable  Although she thinks she is taking her NovoLog based on her pre-meal blood sugar she is usually not checking her sugars before lunch and only occasionally before supper  Blood sugars at suppertime are variably high  Most of her sugars after supper are high with only a couple of readings around 150  She thinks her blood sugars are not well controlled because she is eating from anxiety and may have late night snacks also.  Also minutes before touching her diet during the day  Previously was not taking any mealtime insulin when her glucose was near normal before eating  She also does not take enough insulin to cover her meals because of fear of hypoglycemia  Sometimes may be taking her evening insulin after eating when her blood  sugar goes up  No recent hypoglycemia  Oral hypoglycemic drugs the patient is taking are:  Metformin ER 750 mg 1 bid, Invokana 100 mg daily, Side effects from medications have been: none  Compliance with the medical regimen: Inconsistent  Hypoglycemia:    Glucose monitoring:  done  2-4  times a day         Glucometer: Accucheck      Blood Glucose readings by monitor download:  Mean values apply above for all meters except median for One Touch  PRE-MEAL Fasting Lunch Dinner Bedtime Overall  Glucose range: 118-285   149-280  149-265    Mean/median: 184   208  200  206   Self-care: The diet that the patient has been following is: tries to limit  High-fat foods.     Typical meal intake: Breakfast is variable at 9 AM or none.  Usually has sandwich for lunch, dinner 5 pm              Dietician visit, most recent: never               Exercise:  some slow walking , not able to walk much because of leg pain   Weight history:  Highest 199 , has not lost any weight recently  Wt Readings from Last 3  Encounters:  10/24/15 169 lb (76.7 kg)  09/03/15 169 lb 15.6 oz (77.1 kg)  07/25/15 166 lb 12.8 oz (75.7 kg)    Glycemic control:   Lab Results  Component Value Date   HGBA1C 7.6 10/24/2015   HGBA1C 8.4 (H) 05/20/2015   HGBA1C 7.4 (H) 01/07/2015   Lab Results  Component Value Date   MICROALBUR 1.0 10/25/2014   LDLCALC 76 02/23/2008   CREATININE 1.34 (H) 10/23/2015    OTHER active problems: See review of systems      Medication List       Accurate as of 10/24/15 12:36 PM. Always use your most recent med list.          ACCU-CHEK AVIVA PLUS test strip Generic drug:  glucose blood   ACCU-CHEK FASTCLIX LANCETS Misc Use 4 per day to check blood sugar dx code E11.9   B-D SINGLE USE SWABS REGULAR Pads   clopidogrel 75 MG tablet Commonly known as:  PLAVIX Take 75 mg by mouth daily.   DULoxetine 60 MG capsule Commonly known as:  CYMBALTA TAKE ONE CAPSULE BY MOUTH EVERY  DAY   ferrous sulfate 325 (65 FE) MG tablet Take 325 mg by mouth 2 (two) times daily with a meal.   gabapentin 100 MG capsule Commonly known as:  NEURONTIN Take 200 mg by mouth at bedtime.   HYDROcodone-acetaminophen 10-325 MG tablet Commonly known as:  NORCO Take 1 tablet by mouth every 4 (four) hours as needed for moderate pain or severe pain.   insulin lispro 100 UNIT/ML KiwkPen Commonly known as:  HUMALOG KWIKPEN Inject 0.2 mLs (20 Units total) into the skin 3 (three) times daily.   Insulin Pen Needle 32G X 4 MM Misc Commonly known as:  BD PEN NEEDLE NANO U/F Use to inject insulin   INVOKANA 100 MG Tabs tablet Generic drug:  canagliflozin TAKE 1 TABLET BY MOUTH BEFORE BREAKFAST   KLOR-CON 10 10 MEQ tablet Generic drug:  potassium chloride TAKE 1 TABLET BY MOUTH EVERY DAY   lansoprazole 30 MG capsule Commonly known as:  PREVACID Take 1 capsule (30 mg total) by mouth 2 (two) times daily before a meal.   levothyroxine 137 MCG tablet Commonly known as:  SYNTHROID, LEVOTHROID TAKE 1 TABLET (137 MCG TOTAL) BY MOUTH DAILY BEFORE BREAKFAST.   lisinopril 5 MG tablet Commonly known as:  PRINIVIL,ZESTRIL TAKE 1 TABLET (5 MG TOTAL) BY MOUTH DAILY.   LORazepam 1 MG tablet Commonly known as:  ATIVAN Take 1 tablet (1 mg total) by mouth 3 times/day as needed-between meals & bedtime for anxiety.   metFORMIN 750 MG 24 hr tablet Commonly known as:  GLUCOPHAGE-XR TAKE 2 TABLETS BY MOUTH EVERY DAY   nortriptyline 75 MG capsule Commonly known as:  PAMELOR Take 75 mg by mouth at bedtime.   pravastatin 20 MG tablet Commonly known as:  PRAVACHOL Take 20 mg by mouth daily.   tolterodine 4 MG 24 hr capsule Commonly known as:  DETROL LA Take 4 mg by mouth daily.   TOUJEO SOLOSTAR 300 UNIT/ML Sopn Generic drug:  Insulin Glargine INJECT 60 UNITS INTO THE SKIN 2 TIMES DAILY.       Allergies: No Known Allergies  Past Medical History:  Diagnosis Date  . Anemia   . Anginal  pain ([place])    1 MONTH AGO   . Anxiety   . Arthritis   . CAD (coronary artery disease)   . CAD (coronary artery disease) of bypass graft   .  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 Colon polyps 09/11/2010   Tubular adenoma  . COPD 12/14/2008   PATIENT DENIES    . Depression   . Diabetes mellitus age 64   insulin dependent  . Diabetic coma ([place]) Feb. 2014  . Fall at home Jan. 2, 2016  Jan. 13, 2016   Left knee  . Fibromyalgia   . GERD 07/20/2006  . GERD (gastroesophageal reflux disease)   . Headache(784.0)   . History of transient ischemic attack (TIA)   . HPV (human papilloma virus) infection   . Hyperlipidemia   . Hypertension   . Hypothyroidism   . Insulin dependent type 2 diabetes mellitus, controlled ([place])   . Lumbar disc disease   . Myocardial infarction ([place])    AGE 63    . Neuromuscular disorder ([place])    PERIPHERAL NEUROPATHY  . Peripheral vascular disease ([place])   . Personal history of malignant neoplasm of kidney(V10.52) 12/14/2008   Laparoscopic biopsy and cryoablation 7/08 Dr. Vernie Shanks   . Stroke [facility])    3 MINI STROKES  RT SIDED WEAKNESS  . Vertigo     Past Surgical History:  Procedure Laterality Date  . ABDOMINAL AORTAGRAM N/A 08/21/2011   Procedure: ABDOMINAL Maxcine Ham;  Surgeon: Elam Dutch, MD;  Location: [facility] CATH LAB;  Service: Cardiovascular;  Laterality: N/A;  . ABDOMINAL AORTAGRAM N/A 03/16/2014   Procedure: ABDOMINAL Maxcine Ham;  Surgeon: Elam Dutch, MD;  Location: [facility] CATH LAB;  Service: Cardiovascular;  Laterality: N/A;  . ABDOMINAL HYSTERECTOMY    . APPENDECTOMY    . BLADDER SUSPENSION    . CARDIAC CATHETERIZATION    . CHOLECYSTECTOMY OPEN    . ESOPHAGOGASTRODUODENOSCOPY (EGD) WITH PROPOFOL N/A 05/16/2014   Procedure: ESOPHAGOGASTRODUODENOSCOPY (EGD) WITH PROPOFOL with Balloon dilation;  Surgeon: Milus Banister, MD;  Location: [place];  Service: Endoscopy;  Laterality: N/A;  .  ESOPHAGOGASTRODUODENOSCOPY (EGD) WITH PROPOFOL N/A 09/02/2015   Procedure: ESOPHAGOGASTRODUODENOSCOPY (EGD) WITH PROPOFOL;  Surgeon: Doran Stabler, MD;  Location: [place];  Service: Gastroenterology;  Laterality: N/A;  . FEMORAL-POPLITEAL BYPASS GRAFT  10/12/2011   Procedure: BYPASS GRAFT FEMORAL-POPLITEAL ARTERY;  Surgeon: Elam Dutch, MD;  Location[facility] OR;  Service: Vascular;  Laterality: Left;  Left Femoral-Popliteal Bypass Graft using 1mm x 80cm Propaten Graft with intraop arteriogram times one.  . FEMORAL-POPLITEAL BYPASS GRAFT Left 04/17/2014   Procedure: REDO LEFT FEMORAL-POPLITEAL ARTERY BYPASS USING GORE PROPATEN 54mmx80cm GRAFT;  Surgeon: Elam Dutch, MD;  Location: [place];  Service: Vascular;  Laterality: Left;  . FOOT SURGERY Left    "bone spurs"  . INTRAOPERATIVE ARTERIOGRAM  10/12/2011   Procedure: INTRA OPERATIVE ARTERIOGRAM;  Surgeon: Elam Dutch, MD;  Location: [place];  Service: Vascular;  Laterality: Left;  . KNEE ARTHROSCOPY Bilateral    "cartilage"  . Laparascopic cryoablation of left kidney  08/2006   Dr. Gladis Riffle for renal cell cancer  . LEFT HEART CATHETERIZATION WITH CORONARY ANGIOGRAM N/A 05/11/2011   Procedure: LEFT HEART CATHETERIZATION WITH CORONARY ANGIOGRAM;  Surgeon: Jolaine Artist, MD;  Location: [facility] CATH LAB;  Service: Cardiovascular;  Laterality: N/A;  . LUNG SURGERY Right    lung nodule removed from the right side  . PR VEIN BYPASS GRAFT,AORTO-FEM-POP  05/27/2010  . SHOULDER ARTHROSCOPY Bilateral    'Spurs"  . TUBAL LIGATION      Family History  Problem Relation Age of Onset  . Heart disease  Mother   . Lung cancer Mother   . Cancer Mother     Lung  . Heart disease Father   . Lung cancer Father   . Cancer Father     Lung  . Heart disease Sister     CABG- Open Heart    Social History:  reports that she quit smoking about 16 months ago. Her smoking use included Cigarettes. She has a 12.50 pack-year smoking history. She has never  used smokeless tobacco. She reports that she does not drink alcohol or use drugs.    Review of Systems   Lipid history:  She has good control with taking pravastatin but has relatively high triglycerides    Lab Results  Component Value Date   CHOL 152 08/02/2015   HDL 40.30 08/02/2015   LDLCALC 76 02/23/2008   LDLDIRECT 82.0 08/02/2015   TRIG 239.0 (H) 08/02/2015   CHOLHDL 4 08/02/2015            Hypertension: Not clear what she is taking.  She thinks she is getting a combination of her blood pressure pill and diuretic He is complaining about nocturia from her blood pressure medicine Now not having problems with hypotension or lightheadedness, previously her blood pressure was low  Neurological:    Has no numbness, burning but does have pains , burning and some tingling in feet.   Symptoms better with  Cymbalta  60 mg She is being treated with nortriptyline by PCP also  She has had  peripheral vascular disease and vascular surgeon gave her Pletal  Long history of thyroid disease, Has had post ablative hypothyroidism, her dose was increased to 137 previously   Lab Results  Component Value Date   TSH 2.56 05/20/2015    Last  Foot exam findings: Monofilament sensation is markedly reduced across most of the toes and distal plantar surfaces  Physical Examination:  BP 122/80 (BP Location: Left Arm, Patient Position: Sitting, Cuff Size: Normal)   Pulse 72   Temp 98.6 F (37 C) (Oral)   Wt 169 lb (76.7 kg)   BMI 31.93 kg/m     ASSESSMENT:   Diabetes type 2, uncontrolled   See history of present illness for detailed discussion of his current management, blood sugar patterns and problems identified  Her blood sugars are overall not consistently controlled Although her A1c is better at 7.6 recently her home blood sugars averaging over 200 She has variable readings based on her compliance with diet, mealtime insulin and timing of mealtime insulin Fasting readings are  not consistent possibly based on her diet the night before  PLAN:     She will discuss management of her anxiety with PCP as she is not able to follow her diet if she is eating to control her nervousness and also not watching her diet especially sweets and snacks  Again discussed the need to base her mealtime doses based on what she is planning to eat especially carbohydrates and portion size  She needs to check her blood sugars more consistently at various times of the day including postprandially  She will not skip her insulin even if the blood sugars normal before eating a meal  No change in Toujeo at present since she is occasionally has good readings in the morning  Continue Invokana and metformin  HYPERTENSION: Blood pressure is relatively normal, previously has had low blood pressure readings  Will recommend to the PCP that she does not take HCTZ along with Invokana since she  tends to have higher creatinine levels and tendency to low blood pressures   Patient Instructions  Check blood sugars on waking up  3x per week  Also check blood sugars about 2 hours after a meal and do this after different meals by rotation  Recommended blood sugar levels on waking up is 90-130 and about 2 hours after meal is 130-160  Please bring your blood sugar monitor to each visit, thank you  HUMALOG: Take 15 at lunch and 20 at supper  Stop HCTZ     Counseling time on subjects discussed above is over 50% of today's 25 minute visit  Nino Amano 10/24/2015, 12:36 PM   Note: This office note was prepared with Dragon voice recognition system technology. Any transcriptional errors that result from this process are unintentional.

## 2015-10-31 ENCOUNTER — Encounter: Payer: Self-pay | Admitting: *Deleted

## 2015-11-05 ENCOUNTER — Other Ambulatory Visit: Payer: Self-pay | Admitting: Endocrinology

## 2015-11-06 ENCOUNTER — Other Ambulatory Visit: Payer: Self-pay | Admitting: Endocrinology

## 2015-11-26 ENCOUNTER — Encounter: Payer: Self-pay | Admitting: Family

## 2015-11-28 ENCOUNTER — Ambulatory Visit (INDEPENDENT_AMBULATORY_CARE_PROVIDER_SITE_OTHER): Payer: Medicare Other | Admitting: Family

## 2015-11-28 ENCOUNTER — Ambulatory Visit (INDEPENDENT_AMBULATORY_CARE_PROVIDER_SITE_OTHER)
Admission: RE | Admit: 2015-11-28 | Discharge: 2015-11-28 | Disposition: A | Discharge status: Home / Self Care | Payer: Medicare Other | Source: Ambulatory Visit | Attending: Vascular Surgery | Admitting: Vascular Surgery

## 2015-11-28 ENCOUNTER — Encounter: Payer: Self-pay | Admitting: Family

## 2015-11-28 ENCOUNTER — Ambulatory Visit (HOSPITAL_COMMUNITY)
Admission: RE | Admit: 2015-11-28 | Discharge: 2015-11-28 | Disposition: A | Discharge status: Home / Self Care | Payer: Medicare Other | Source: Ambulatory Visit | Attending: Family | Admitting: Family

## 2015-11-28 VITALS — BP 135/65 | HR 44 | Temp 97.2°F | Resp 18 | Ht 61.0 in | Wt 169.2 lb

## 2015-11-28 DIAGNOSIS — IMO0002 Reserved for concepts with insufficient information to code with codable children: Secondary | ICD-10-CM

## 2015-11-28 DIAGNOSIS — Z87891 Personal history of nicotine dependence: Secondary | ICD-10-CM | POA: Diagnosis not present

## 2015-11-28 DIAGNOSIS — T82898D Other specified complication of vascular prosthetic devices, implants and grafts, subsequent encounter: Secondary | ICD-10-CM | POA: Diagnosis not present

## 2015-11-28 DIAGNOSIS — E1151 Type 2 diabetes mellitus with diabetic peripheral angiopathy without gangrene: Secondary | ICD-10-CM | POA: Diagnosis not present

## 2015-11-28 DIAGNOSIS — Z95828 Presence of other vascular implants and grafts: Secondary | ICD-10-CM | POA: Diagnosis not present

## 2015-11-28 DIAGNOSIS — I6523 Occlusion and stenosis of bilateral carotid arteries: Secondary | ICD-10-CM | POA: Diagnosis not present

## 2015-11-28 DIAGNOSIS — I70212 Atherosclerosis of native arteries of extremities with intermittent claudication, left leg: Secondary | ICD-10-CM | POA: Diagnosis not present

## 2015-11-28 DIAGNOSIS — E1165 Type 2 diabetes mellitus with hyperglycemia: Secondary | ICD-10-CM

## 2015-11-28 LAB — VAS US CAROTID
LCCADDIAS: -27 cm/s
LCCAPSYS: 104 cm/s
LEFT ECA DIAS: -4 cm/s
LEFT VERTEBRAL DIAS: -9 cm/s
LICADDIAS: -31 cm/s
Left CCA dist sys: -123 cm/s
Left CCA prox dias: 20 cm/s
Left ICA dist sys: -104 cm/s
Left ICA prox dias: -29 cm/s
Left ICA prox sys: -130 cm/s
RCCADSYS: -113 cm/s
RCCAPSYS: 72 cm/s
RIGHT CCA MID DIAS: 16 cm/s
RIGHT ECA DIAS: -8 cm/s
RIGHT VERTEBRAL DIAS: -11 cm/s
Right CCA prox dias: 13 cm/s

## 2015-11-28 MED ORDER — CILOSTAZOL 100 MG PO TABS
100.0000 mg | ORAL_TABLET | Freq: Two times a day (BID) | ORAL | 11 refills | Status: DC
Start: 2015-11-28 — End: 2016-03-17

## 2015-11-28 NOTE — Patient Instructions (Signed)
Peripheral Vascular Disease Peripheral vascular disease (PVD) is a disease of the blood vessels that are not part of your heart and brain. A simple term for PVD is poor circulation. In most cases, PVD narrows the blood vessels that carry blood from your heart to the rest of your body. This can result in a decreased supply of blood to your arms, legs, and internal organs, like your stomach or kidneys. However, it most often affects a person's lower legs and feet. There are two types of PVD.  Organic PVD. This is the more common type. It is caused by damage to the structure of blood vessels.  Functional PVD. This is caused by conditions that make blood vessels contract and tighten (spasm). Without treatment, PVD tends to get worse over time. PVD can also lead to acute ischemic limb. This is when an arm or limb suddenly has trouble getting enough blood. This is a medical emergency. CAUSES Each type of PVD has many different causes. The most common cause of PVD is buildup of a fatty material (plaque) inside of your arteries (atherosclerosis). Small amounts of plaque can break off from the walls of the blood vessels and become lodged in a smaller artery. This blocks blood flow and can cause acute ischemic limb. Other common causes of PVD include:  Blood clots that form inside of blood vessels.  Injuries to blood vessels.  Diseases that cause inflammation of blood vessels or cause blood vessel spasms.  Health behaviors and health history that increase your risk of developing PVD. RISK FACTORS  You may have a greater risk of PVD if you:  Have a family history of PVD.  Have certain medical conditions, including:  High cholesterol.  Diabetes.  High blood pressure (hypertension).  Coronary heart disease.  Past problems with blood clots.  Past injury, such as burns or a broken bone. These may have damaged blood vessels in your limbs.  Buerger disease. This is caused by inflamed blood  vessels in your hands and feet.  Some forms of arthritis.  Rare birth defects that affect the arteries in your legs.  Use tobacco.  Do not get enough exercise.  Are obese.  Are age 50 or older. SIGNS AND SYMPTOMS  PVD may cause many different symptoms. Your symptoms depend on what part of your body is not getting enough blood. Some common signs and symptoms include:  Cramps in your lower legs. This may be a symptom of poor leg circulation (claudication).  Pain and weakness in your legs while you are physically active that goes away when you rest (intermittent claudication).  Leg pain when at rest.  Leg numbness, tingling, or weakness.  Coldness in a leg or foot, especially when compared with the other leg.  Skin or hair changes. These can include:  Hair loss.  Shiny skin.  Pale or bluish skin.  Thick toenails.  Inability to get or maintain an erection (erectile dysfunction). People with PVD are more prone to developing ulcers and sores on their toes, feet, or legs. These may take longer than normal to heal. DIAGNOSIS Your health care provider may diagnose PVD from your signs and symptoms. The health care provider will also do a physical exam. You may have tests to find out what is causing your PVD and determine its severity. Tests may include:  Blood pressure recordings from your arms and legs and measurements of the strength of your pulses (pulse volume recordings).  Imaging studies using sound waves to take pictures of   the blood flow through your blood vessels (Doppler ultrasound).  Injecting a dye into your blood vessels before having imaging studies using:  X-rays (angiogram or arteriogram).  Computer-generated X-rays (CT angiogram).  A powerful electromagnetic field and a computer (magnetic resonance angiogram or MRA). TREATMENT Treatment for PVD depends on the cause of your condition and the severity of your symptoms. It also depends on your age. Underlying  causes need to be treated and controlled. These include long-lasting (chronic) conditions, such as diabetes, high cholesterol, and high blood pressure. You may need to first try making lifestyle changes and taking medicines. Surgery may be needed if these do not work. Lifestyle changes may include:  Quitting smoking.  Exercising regularly.  Following a low-fat, low-cholesterol diet. Medicines may include:  Blood thinners to prevent blood clots.  Medicines to improve blood flow.  Medicines to improve your blood cholesterol levels. Surgical procedures may include:  A procedure that uses an inflated balloon to open a blocked artery and improve blood flow (angioplasty).  A procedure to put in a tube (stent) to keep a blocked artery open (stent implant).  Surgery to reroute blood flow around a blocked artery (peripheral bypass surgery).  Surgery to remove dead tissue from an infected wound on the affected limb.  Amputation. This is surgical removal of the affected limb. This may be necessary in cases of acute ischemic limb that are not improved through medical or surgical treatments. HOME CARE INSTRUCTIONS  Take medicines only as directed by your health care provider.  Do not use any tobacco products, including cigarettes, chewing tobacco, or electronic cigarettes. If you need help quitting, ask your health care provider.  Lose weight if you are overweight, and maintain a healthy weight as directed by your health care provider.  Eat a diet that is low in fat and cholesterol. If you need help, ask your health care provider.  Exercise regularly. Ask your health care provider to suggest some good activities for you.  Use compression stockings or other mechanical devices as directed by your health care provider.  Take good care of your feet.  Wear comfortable shoes that fit well.  Check your feet often for any cuts or sores. SEEK MEDICAL CARE IF:  You have cramps in your legs  while walking.  You have leg pain when you are at rest.  You have coldness in a leg or foot.  Your skin changes.  You have erectile dysfunction.  You have cuts or sores on your feet that are not healing. SEEK IMMEDIATE MEDICAL CARE IF:  Your arm or leg turns cold and blue.  Your arms or legs become red, warm, swollen, painful, or numb.  You have chest pain or trouble breathing.  You suddenly have weakness in your face, arm, or leg.  You become very confused or lose the ability to speak.  You suddenly have a very bad headache or lose your vision.   This information is not intended to replace advice given to you by your health care provider. Make sure you discuss any questions you have with your health care provider.   Document Released: 03/19/2004 Document Revised: 03/02/2014 Document Reviewed: 07/20/2013 [employer] Interactive Patient Education 2016 [employer]    Stroke Prevention Some medical conditions and behaviors are associated with an increased chance of having a stroke. You may prevent a stroke by making healthy choices and managing medical conditions. HOW CAN I REDUCE MY RISK OF HAVING A STROKE?   Stay physically active. Get at   least 30 minutes of activity on most or all days.  Do not smoke. It may also be helpful to avoid exposure to secondhand smoke.  Limit alcohol use. Moderate alcohol use is considered to be:  No more than 2 drinks per day for men.  No more than 1 drink per day for nonpregnant women.  Eat healthy foods. This involves:  Eating 5 or more servings of fruits and vegetables a day.  Making dietary changes that address high blood pressure (hypertension), high cholesterol, diabetes, or obesity.  Manage your cholesterol levels.  Making food choices that are high in fiber and low in saturated fat, trans fat, and cholesterol may control cholesterol levels.  Take any prescribed medicines to control cholesterol as directed by your health care  provider.  Manage your diabetes.  Controlling your carbohydrate and sugar intake is recommended to manage diabetes.  Take any prescribed medicines to control diabetes as directed by your health care provider.  Control your hypertension.  Making food choices that are low in salt (sodium), saturated fat, trans fat, and cholesterol is recommended to manage hypertension.  Ask your health care provider if you need treatment to lower your blood pressure. Take any prescribed medicines to control hypertension as directed by your health care provider.  If you are 18-39 years of age, have your blood pressure checked every 3-5 years. If you are 40 years of age or older, have your blood pressure checked every year.  Maintain a healthy weight.  Reducing calorie intake and making food choices that are low in sodium, saturated fat, trans fat, and cholesterol are recommended to manage weight.  Stop drug abuse.  Avoid taking birth control pills.  Talk to your health care provider about the risks of taking birth control pills if you are over 35 years old, smoke, get migraines, or have ever had a blood clot.  Get evaluated for sleep disorders (sleep apnea).  Talk to your health care provider about getting a sleep evaluation if you snore a lot or have excessive sleepiness.  Take medicines only as directed by your health care provider.  For some people, aspirin or blood thinners (anticoagulants) are helpful in reducing the risk of forming abnormal blood clots that can lead to stroke. If you have the irregular heart rhythm of atrial fibrillation, you should be on a blood thinner unless there is a good reason you cannot take them.  Understand all your medicine instructions.  Make sure that other conditions (such as anemia or atherosclerosis) are addressed. SEEK IMMEDIATE MEDICAL CARE IF:   You have sudden weakness or numbness of the face, arm, or leg, especially on one side of the body.  Your face  or eyelid droops to one side.  You have sudden confusion.  You have trouble speaking (aphasia) or understanding.  You have sudden trouble seeing in one or both eyes.  You have sudden trouble walking.  You have dizziness.  You have a loss of balance or coordination.  You have a sudden, severe headache with no known cause.  You have new chest pain or an irregular heartbeat. Any of these symptoms may represent a serious problem that is an emergency. Do not wait to see if the symptoms will go away. Get medical help at once. Call your local emergency services (911 in [place] Do not drive yourself to the hospital.   This information is not intended to replace advice given to you by your health care provider. Make sure you discuss any questions   you have with your health care provider.   Document Released: 03/19/2004 Document Revised: 03/02/2014 Document Reviewed: 08/12/2012 [employer] Interactive Patient Education 2016 [employer]  

## 2015-11-28 NOTE — Progress Notes (Signed)
[facility] [place] HISTORY AND PHYSICAL   CC: Follow up intermittent claudication and extracranial carotid artery stenosis  History of Present Illness:   April Branch is a 70 y.o. female patient of Dr. Oneida Alar who is status post left femoropopliteal bypass graft with propaten on 10/12/2011 with subsequent occlusion, and left femoral to below-knee popliteal bypass with propaten on 04/17/2014.  She also has a history of carotid artery stenosis.   She returns today for follow up. The dogbite wound on her left ankle, medial aspect, has completely healed.  Baseline she has peripheral neuropathy pain in both feet, gabapentin helps slightly with this.  After walking 5 minutes her left calf hurts, relieved by rest. She denies claudication sx's in her right leg.  Unclear why she did not start Pletal prescribed at her 05/23/15 visit, she does not recall this.   Left hand claudication/pain/weakness sx's have been present for years, states this has stablized, she has trouble holding on to objects in her left hand.  She denies non healing wounds. She has severe vertigo/ataxia. States she had brain imaging about 2010 which demonstrated 3 TIA'S, has intermittent dizziness, has tingling and numbness in both hands, weakness in both hands, moreso in left.  Carotid Duplex on file result was from 8/5/20013, showed 60-79% right ICA stenosis and 40-59% left ICA stenosis, requested by Dr. Burt Knack, pt does not recall this.  March, 2015 carotid Duplex demonstrated <40% stenosis in both internal carotid arteries.  She has known lumbar spine issues with radiculopathy in both legs.  She gets choked easily, has to drink through a straw.  She no longer has dry cracked fissures on both heals. She has no claudication symptoms in her left leg. Her right calf hurts after walking about 1/2 mile, relieved with rest.  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   She had severe headaches several years ago which prompted brain imaging, states was found she had 3 ministrokes, she does not recall any TIA symptoms other than the headache, denies lateralizing symptoms.   She was hospitalized recently at [facility] for dehydration, does not know how she got dehydrated.   Pt states her B12 level has not been checked, she reports stable ataxia.  Pt Diabetic: Yes, 7.6 A1C on 10/24/15 (review of records); 8.4 on 05/20/15; this is an improvement since she has been seeing Dr. [name]  Pt smoker: former smoker (quit January 2016, started at age 89 yrs)  Pt meds include:  Statin :Yes  ASA: No  Other anticoagulants/antiplatelets: Plavix    Current Outpatient Prescriptions  Medication Sig Dispense Refill  . ACCU-CHEK AVIVA PLUS test strip     . ACCU-CHEK FASTCLIX LANCETS MISC Use 4 per day to check blood sugar dx code E11.9 102 each 3  . Alcohol Swabs (B-D SINGLE USE SWABS REGULAR) PADS     . clopidogrel (PLAVIX) 75 MG tablet Take 75 mg by mouth daily.     . DULoxetine (CYMBALTA) 60 MG capsule TAKE ONE CAPSULE BY MOUTH EVERY DAY 30 capsule 3  . ferrous sulfate 325 (65 FE) MG tablet Take 325 mg by mouth 2 (two) times daily with a meal.    . gabapentin (NEURONTIN) 100 MG capsule Take 200 mg by mouth at bedtime.     Marland Kitchen HYDROcodone-acetaminophen (NORCO) 10-325 MG tablet Take 1 tablet by mouth every  4 (four) hours as needed for moderate pain or severe pain. 10 tablet 0  . insulin lispro (HUMALOG KWIKPEN) 100 UNIT/ML KiwkPen Inject 0.2 mLs (20 Units total) into the skin 3 (three) times daily. 15 mL 3  . Insulin Pen Needle (BD PEN NEEDLE NANO U/F) 32G X 4 MM MISC Use to inject insulin 100 each 3  . INVOKANA 100 MG TABS tablet TAKE 1 TABLET BY MOUTH BEFORE BREAKFAST 30 tablet 3  . KLOR-CON 10 10 MEQ tablet TAKE 1 TABLET BY MOUTH EVERY DAY 30 tablet 3  . lansoprazole  (PREVACID) 30 MG capsule Take 1 capsule (30 mg total) by mouth 2 (two) times daily before a meal. (Patient taking differently: Take 30 mg by mouth daily. ) 60 capsule 11  . levothyroxine (SYNTHROID, LEVOTHROID) 137 MCG tablet TAKE 1 TABLET (137 MCG TOTAL) BY MOUTH DAILY BEFORE BREAKFAST. 30 tablet 5  . lisinopril (PRINIVIL,ZESTRIL) 5 MG tablet TAKE 1 TABLET (5 MG TOTAL) BY MOUTH DAILY. 30 tablet 3  . metFORMIN (GLUCOPHAGE-XR) 750 MG 24 hr tablet TAKE 2 TABLETS BY MOUTH EVERY DAY 60 tablet 3  . nortriptyline (PAMELOR) 75 MG capsule Take 75 mg by mouth at bedtime.    . pravastatin (PRAVACHOL) 20 MG tablet Take 20 mg by mouth daily.    Marland Kitchen tolterodine (DETROL LA) 4 MG 24 hr capsule Take 4 mg by mouth daily.     . TOUJEO SOLOSTAR 300 UNIT/ML SOPN INJECT 60 UNITS INTO THE SKIN 2 TIMES DAILY. 14 pen 3  . cilostazol (PLETAL) 100 MG tablet Take 1 tablet (100 mg total) by mouth 2 (two) times daily before a meal. 60 tablet 11  . LORazepam (ATIVAN) 1 MG tablet Take 1 tablet (1 mg total) by mouth 3 times/day as needed-between meals & bedtime for anxiety. (Patient not taking: Reported on 11/28/2015) 9 tablet 0   No current facility-administered medications for this visit.     Past Medical History:  Diagnosis Date  . Anemia   . Anginal pain ([place])    1 MONTH AGO   . Anxiety   . Arthritis   . CAD (coronary artery disease)   . CAD (coronary artery disease) of bypass graft   . 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 Colon polyps 09/11/2010   Tubular adenoma  . COPD 12/14/2008   PATIENT DENIES    . Depression   . Diabetes mellitus age 5   insulin dependent  . Diabetic coma ([place]) Feb. 2014  . Fall at home Jan. 2, 2016  Jan. 13, 2016   Left knee  . Fibromyalgia   . GERD 07/20/2006  . GERD (gastroesophageal reflux disease)   . Headache(784.0)   . History of transient ischemic attack (TIA)   . HPV (human papilloma virus) infection   . Hyperlipidemia   . Hypertension    . Hypothyroidism   . Insulin dependent type 2 diabetes mellitus, controlled ([place])   . Lumbar disc disease   . Myocardial infarction    AGE 20    . Neuromuscular disorder ([place])    PERIPHERAL NEUROPATHY  . Peripheral vascular disease ([place])   . Personal history of malignant neoplasm of kidney(V10.52) 12/14/2008   Laparoscopic biopsy and cryoablation 7/08 Dr. Vernie Shanks   . Stroke [facility])    3 MINI STROKES  RT SIDED WEAKNESS  . Vertigo     Social History Social History  Substance Use Topics  . Smoking status: Former Smoker  Packs/day: 0.25    Years: 50.00    Types: Cigarettes    Quit date: 06/24/2014  . Smokeless tobacco: Never Used  . Alcohol use No    Family History Family History  Problem Relation Age of Onset  . Heart disease Mother   . Lung cancer Mother   . Cancer Mother     Lung  . Heart disease Father   . Lung cancer Father   . Cancer Father     Lung  . Heart disease Sister     CABG- Open Heart    Surgical History Past Surgical History:  Procedure Laterality Date  . ABDOMINAL AORTAGRAM N/A 08/21/2011   Procedure: ABDOMINAL Maxcine Ham;  Surgeon: Elam Dutch, MD;  Location: [facility] CATH LAB;  Service: Cardiovascular;  Laterality: N/A;  . ABDOMINAL AORTAGRAM N/A 03/16/2014   Procedure: ABDOMINAL Maxcine Ham;  Surgeon: Elam Dutch, MD;  Location: [facility] CATH LAB;  Service: Cardiovascular;  Laterality: N/A;  . ABDOMINAL HYSTERECTOMY    . APPENDECTOMY    . BLADDER SUSPENSION    . CARDIAC CATHETERIZATION    . CHOLECYSTECTOMY OPEN    . ESOPHAGOGASTRODUODENOSCOPY (EGD) WITH PROPOFOL N/A 05/16/2014   Procedure: ESOPHAGOGASTRODUODENOSCOPY (EGD) WITH PROPOFOL with Balloon dilation;  Surgeon: Milus Banister, MD;  Location: [place];  Service: Endoscopy;  Laterality: N/A;  . ESOPHAGOGASTRODUODENOSCOPY (EGD) WITH PROPOFOL N/A 09/02/2015   Procedure: ESOPHAGOGASTRODUODENOSCOPY (EGD) WITH PROPOFOL;  Surgeon: Doran Stabler, MD;  Location: [place];  Service:  Gastroenterology;  Laterality: N/A;  . FEMORAL-POPLITEAL BYPASS GRAFT  10/12/2011   Procedure: BYPASS GRAFT FEMORAL-POPLITEAL ARTERY;  Surgeon: Elam Dutch, MD;  Location: [facility] OR;  Service: Vascular;  Laterality: Left;  Left Femoral-Popliteal Bypass Graft using 98mm x 80cm Propaten Graft with intraop arteriogram times one.  . FEMORAL-POPLITEAL BYPASS GRAFT Left 04/17/2014   Procedure: REDO LEFT FEMORAL-POPLITEAL ARTERY BYPASS USING GORE PROPATEN 34mmx80cm GRAFT;  Surgeon: Elam Dutch, MD;  Location: [place];  Service: Vascular;  Laterality: Left;  . FOOT SURGERY Left    "bone spurs"  . INTRAOPERATIVE ARTERIOGRAM  10/12/2011   Procedure: INTRA OPERATIVE ARTERIOGRAM;  Surgeon: Elam Dutch, MD;  Location: [place];  Service: Vascular;  Laterality: Left;  . KNEE ARTHROSCOPY Bilateral    "cartilage"  . Laparascopic cryoablation of left kidney  08/2006   Dr. Gladis Riffle for renal cell cancer  . LEFT HEART CATHETERIZATION WITH CORONARY ANGIOGRAM N/A 05/11/2011   Procedure: LEFT HEART CATHETERIZATION WITH CORONARY ANGIOGRAM;  Surgeon: Jolaine Artist, MD;  Location: [facility] CATH LAB;  Service: Cardiovascular;  Laterality: N/A;  . LUNG SURGERY Right    lung nodule removed from the right side  . PR VEIN BYPASS GRAFT,AORTO-FEM-POP  05/27/2010  . SHOULDER ARTHROSCOPY Bilateral    'Spurs"  . TUBAL LIGATION      No Known Allergies  Current Outpatient Prescriptions  Medication Sig Dispense Refill  . ACCU-CHEK AVIVA PLUS test strip     . ACCU-CHEK FASTCLIX LANCETS MISC Use 4 per day to check blood sugar dx code E11.9 102 each 3  . Alcohol Swabs (B-D SINGLE USE SWABS REGULAR) PADS     . clopidogrel (PLAVIX) 75 MG tablet Take 75 mg by mouth daily.     . DULoxetine (CYMBALTA) 60 MG capsule TAKE ONE CAPSULE BY MOUTH EVERY DAY 30 capsule 3  . ferrous sulfate 325 (65 FE) MG tablet Take 325 mg by mouth 2 (two) times daily with a meal.    . gabapentin (NEURONTIN) 100 MG  capsule Take 200 mg by mouth at bedtime.      Marland Kitchen HYDROcodone-acetaminophen (NORCO) 10-325 MG tablet Take 1 tablet by mouth every 4 (four) hours as needed for moderate pain or severe pain. 10 tablet 0  . insulin lispro (HUMALOG KWIKPEN) 100 UNIT/ML KiwkPen Inject 0.2 mLs (20 Units total) into the skin 3 (three) times daily. 15 mL 3  . Insulin Pen Needle (BD PEN NEEDLE NANO U/F) 32G X 4 MM MISC Use to inject insulin 100 each 3  . INVOKANA 100 MG TABS tablet TAKE 1 TABLET BY MOUTH BEFORE BREAKFAST 30 tablet 3  . KLOR-CON 10 10 MEQ tablet TAKE 1 TABLET BY MOUTH EVERY DAY 30 tablet 3  . lansoprazole (PREVACID) 30 MG capsule Take 1 capsule (30 mg total) by mouth 2 (two) times daily before a meal. (Patient taking differently: Take 30 mg by mouth daily. ) 60 capsule 11  . levothyroxine (SYNTHROID, LEVOTHROID) 137 MCG tablet TAKE 1 TABLET (137 MCG TOTAL) BY MOUTH DAILY BEFORE BREAKFAST. 30 tablet 5  . lisinopril (PRINIVIL,ZESTRIL) 5 MG tablet TAKE 1 TABLET (5 MG TOTAL) BY MOUTH DAILY. 30 tablet 3  . metFORMIN (GLUCOPHAGE-XR) 750 MG 24 hr tablet TAKE 2 TABLETS BY MOUTH EVERY DAY 60 tablet 3  . nortriptyline (PAMELOR) 75 MG capsule Take 75 mg by mouth at bedtime.    . pravastatin (PRAVACHOL) 20 MG tablet Take 20 mg by mouth daily.    Marland Kitchen tolterodine (DETROL LA) 4 MG 24 hr capsule Take 4 mg by mouth daily.     . TOUJEO SOLOSTAR 300 UNIT/ML SOPN INJECT 60 UNITS INTO THE SKIN 2 TIMES DAILY. 14 pen 3  . cilostazol (PLETAL) 100 MG tablet Take 1 tablet (100 mg total) by mouth 2 (two) times daily before a meal. 60 tablet 11  . LORazepam (ATIVAN) 1 MG tablet Take 1 tablet (1 mg total) by mouth 3 times/day as needed-between meals & bedtime for anxiety. (Patient not taking: Reported on 11/28/2015) 9 tablet 0   No current facility-administered medications for this visit.      REVIEW OF SYSTEMS: See HPI for pertinent positives and negatives.  Physical Examination Vitals:   11/28/15 1158 11/28/15 1209  BP: (!) 146/68 135/65  Pulse: (!) 44   Resp: 18   Temp:  97.2 F (36.2 C)   TempSrc: Oral   SpO2: 90%   Weight: 169 lb 3.2 oz (76.7 kg)   Height: 5\' 1"  (1.549 m)    Body mass index is 31.97 kg/m.  General: A&O x 3, WDWN, obese female.  Gait: mildly ataxic Eyes: PERRLA.  Pulmonary: Respirations are non labored, CTAB, no rales, wheezes ,or rhonchi. Cardiac: regular rhythm, bradycardic (not on a beta blocker), no detected murmur.   Carotid Bruits  Left  Right    Negative  positive   Aorta is not palpable.  Radial pulses: right is 2+ palpable, left is faintly palpable, left brachial pulse is 2+ palpable   VASCULAR EXAM:  Extremities without ischemic changes  without Gangrene; without open wounds.   LE Pulses  LEFT  RIGHT   FEMORAL  2+ palpable  1+ palpable   POPLITEAL  not palpable  not palpable   POSTERIOR TIBIAL  not palpable  not palpable   DORSALIS PEDIS  ANTERIOR TIBIAL  Not palpable  not palpable    Abdomen: soft, NT, no palpable masses.  Skin: no rashes, no ulcers, see Extremities.  Musculoskeletal: no muscle wasting or atrophy.  Neurologic: A&O X 3; Appropriate Affect;  MOTOR FUNCTION: moving all extremities equally, motor strength 5/5 throughout. Speech is slightly slurred. CN 2-12 intact except for uvula deviation to the right.  ASSESSMENT:  BRAYDEN KIRKBY is a 70 y.o. female who is status post left femoropopliteal bypass graft with propaten on 10/12/2011 with subsequent occlusion, left femoral to below-knee popliteal bypass with propaten on 04/17/2014.  She also has a history of carotid artery stenosis.  She does not give a history of known TIA events, but pt reports that brain imaging from about 2010 indicates a history of 3 TIA's. She has intermittent vertigo or ataxia, not clear if this is TIA related.  September 2016 carotid duplex suggests minimal bilateral ICA stenoses, no significant change from 05/19/13.    She developed left calf claudication In February 2017, had no claudication since the above left LE redo vascularization up until February 2017. The left calf claudication starts after walking about 5 mintues, relieved with rest. She has no signs of ischemia in her feet/legs. She has been walking more.  Pt denies hx of CHF, will again try to start Pletal 100 mg daily for 1-2 weeks, if she has no diarrhea and this is adequate to help with left calf claudication, will maintain this dose; she may increase to 100 mg twice daily if once daily does not adequately help claudication.    Bradycardic at a rate of 44/minute, she is not on a beta blocker (is not on her list of medications). She remains ataxic.   DATA 05/23/15 left LE arterial duplex was unable to document flow in the left-fem-popliteal bypass graft. Monophasic flow noted in the native popliteal and posterior tibia artery. No flow was documented in the anterior tibia artery. The left peroneal demonstrated blunted waveforms with decreased velocities suggestive of occlusive disease distally.  Today's bilateral ABI's have improved compared to previous exam of 05/23/15, right from 61% to 85% (toe pressure 93). Left improved from 55% to 74% (toe pressure 111) All monophasic waveforms.   Carotid duplex today demonstrates <40% bilateral ICA stenosis, both vertebral arteries are antegrade (normal), both subclavian arteries are triphasic (normal). No significant change compared to duplex exam on 11/22/14.   PLAN:   Pletal reordered to help alleviate left calf claudication since pt had not taken when prescribed at her last visit with me, she did not receive this from her pharmacy.  Continue graduated walking program.   Based on today's exam and non-invasive vascular lab results, the patient will follow up in 3 months with the following tests: ABI's, carotid duplex in a year. I discussed in depth with the patient the nature of atherosclerosis,  and emphasized the importance of maximal medical management including strict control of blood pressure, blood glucose, and lipid levels, obtaining regular exercise, and cessation of smoking.  The patient is aware that without maximal medical management the underlying atherosclerotic disease process will progress, limiting the benefit of any interventions.  The patient was given information about stroke prevention and what symptoms should prompt the patient to seek immediate medical care.  The patient was given information about PAD including signs, symptoms, treatment, what symptoms should prompt the patient to seek immediate medical care, and risk reduction measures to take. Thank you for allowing [place] to participate in this patient's care.  April Chambers, RN, MSN, FNP-C [facility] Office: [phone]  Clinic MD: [facility] 11/28/2015 12:31 PM

## 2015-11-29 NOTE — Addendum Note (Signed)
Addended by: Kaleen Mask on: 11/29/2015 04:44 PM   Modules accepted: Orders

## 2015-12-07 ENCOUNTER — Other Ambulatory Visit: Payer: Self-pay | Admitting: Endocrinology

## 2015-12-12 ENCOUNTER — Other Ambulatory Visit (INDEPENDENT_AMBULATORY_CARE_PROVIDER_SITE_OTHER): Payer: Medicare Other

## 2015-12-12 DIAGNOSIS — E038 Other specified hypothyroidism: Secondary | ICD-10-CM | POA: Diagnosis not present

## 2015-12-12 DIAGNOSIS — E1165 Type 2 diabetes mellitus with hyperglycemia: Secondary | ICD-10-CM

## 2015-12-12 DIAGNOSIS — Z794 Long term (current) use of insulin: Secondary | ICD-10-CM | POA: Diagnosis not present

## 2015-12-12 LAB — BASIC METABOLIC PANEL
BUN: 21 mg/dL (ref 6–23)
CALCIUM: 9.6 mg/dL (ref 8.4–10.5)
CO2: 26 meq/L (ref 19–32)
CREATININE: 1.43 mg/dL — AB (ref 0.40–1.20)
Chloride: 106 mEq/L (ref 96–112)
GFR: 38.74 mL/min — ABNORMAL LOW (ref >60.00)
Glucose, Bld: 121 mg/dL — ABNORMAL HIGH (ref 70–99)
Potassium: 4.7 mEq/L (ref 3.5–5.1)
Sodium: 139 mEq/L (ref 135–145)

## 2015-12-12 LAB — T4, FREE: FREE T4: 0.76 ng/dL (ref 0.60–1.60)

## 2015-12-12 LAB — TSH: TSH: 0.99 u[IU]/mL (ref 0.35–4.50)

## 2015-12-13 ENCOUNTER — Other Ambulatory Visit: Payer: Self-pay

## 2015-12-13 LAB — URINALYSIS, ROUTINE W REFLEX MICROSCOPIC
BILIRUBIN URINE: NEGATIVE
KETONES UR: NEGATIVE
LEUKOCYTES UA: NEGATIVE
NITRITE: NEGATIVE
PH: 5 (ref 5.0–8.0)
Specific Gravity, Urine: 1.01 (ref 1.000–1.030)
Total Protein, Urine: NEGATIVE
Urine Glucose: >=1000 — AB
Urobilinogen, UA: 0.2 (ref 0.0–1.0)
WBC UA: NONE SEEN (ref 0–?)

## 2015-12-13 LAB — MICROALBUMIN / CREATININE URINE RATIO
CREATININE, U: 54.3 mg/dL
Microalb Creat Ratio: 1.8 mg/g (ref 0.0–30.0)
Microalb, Ur: 1 mg/dL (ref 0.0–1.9)

## 2015-12-13 LAB — FRUCTOSAMINE: Fructosamine: 261 umol/L (ref 0–285)

## 2015-12-17 ENCOUNTER — Ambulatory Visit: Payer: Medicare Other | Admitting: Endocrinology

## 2015-12-18 ENCOUNTER — Encounter: Payer: Self-pay | Admitting: Endocrinology

## 2015-12-18 ENCOUNTER — Ambulatory Visit (INDEPENDENT_AMBULATORY_CARE_PROVIDER_SITE_OTHER): Payer: Medicare Other | Admitting: Endocrinology

## 2015-12-18 VITALS — BP 118/66 | HR 60 | Ht 61.0 in | Wt 167.0 lb

## 2015-12-18 DIAGNOSIS — Z794 Long term (current) use of insulin: Secondary | ICD-10-CM | POA: Diagnosis not present

## 2015-12-18 DIAGNOSIS — E1165 Type 2 diabetes mellitus with hyperglycemia: Secondary | ICD-10-CM | POA: Diagnosis not present

## 2015-12-18 NOTE — Progress Notes (Signed)
Patient ID: April Branch, female   DOB: [birth year], 70 y.o.   MRN: [id number]           Reason for Appointment: Follow-up  for Type 2 Diabetes  Referring physician: Pattricia Boss  History of Present Illness:          Date of diagnosis of type 2 diabetes mellitus: 1980s       Previous history:    She thinks she was treated with metformin for some time before she went on insulin in ?  2002   She has mostly been treated with Lantus and rapid acting insulin such as Apidra or Novolog.   She thinks that Apidra worked fairly fast but insurance did not cover this, generally has had poor control   Recent history:   INSULIN regimen is described as:   Toujeo 60 units in am, 10 Novolog before meals  On her initial consultation she was switched from Lantus to [facility] because of blood sugars mostly over 300 Was also started on metformin ER 750 mg, 2 tablets daily initially and  in 3/17 she was started on Invokana 100 mg daily because of  poor control  Her A1c on the last visit was 7.6, now fructosamine is slightly better at 261  Current blood sugar patterns and problems identified:  FASTING blood sugars are being checked only sporadically again  However fasting readings are now fairly consistently near normal without hypoglycemia  Although she thinks she is eating only one meal a in the evening her blood sugars are usually high mid-day and afternoon  She does admit that she does eat snacks like cookies or fruit sometimes without taking any insulin and also occasionally will have something for lunch.  She does not take enough insulin for DINNERTIME since she has an average of 201 for her postprandial reading at night; also may have snacks like grapes after eating dinner  She thinks that occasionally she has taken 10 units of Novolog for lunch but may have had a low sugar afterwards  She is trying to take her NovoLog before eating at suppertime more regularly.    Occasionally she forgets her  metformin at suppertime and will take it later  Her weight is slightly better  Oral hypoglycemic drugs the patient is taking are:  Metformin ER 750 mg 1 bid, Invokana 100 mg daily, Side effects from medications have been: none  Compliance with the medical regimen: Inconsistent  Hypoglycemia: Rare   Glucose monitoring:  done  2-4  times a day         Glucometer: Accucheck      Blood Glucose readings by monitor download:  Mean values apply above for all meters except median for One Touch  PRE-MEAL Fasting Lunch Dinner Bedtime Overall  Glucose range: 88-135   124-194  163-243    Mean/median:    201  178   Self-care: The diet that the patient has been following is: tries to limit  High-fat foods.     Typical meal intake: Breakfast is variable at 9 AM or none.  Usually has sandwich for lunch, dinner 7 pm              Dietician visit, most recent: never               Exercise:  some walking limited by leg pain   Weight history:  Highest 199 , has not lost any weight recently  Wt Readings from Last 3 Encounters:  12/18/15 167 lb (  75.8 kg)  11/28/15 169 lb 3.2 oz (76.7 kg)  10/24/15 169 lb (76.7 kg)    Glycemic control:   Lab Results  Component Value Date   HGBA1C 7.6 10/24/2015   HGBA1C 8.4 (H) 05/20/2015   HGBA1C 7.4 (H) 01/07/2015   Lab Results  Component Value Date   MICROALBUR 1.0 12/13/2015   LDLCALC 76 02/23/2008   CREATININE 1.43 (H) 12/12/2015    Lab on 12/12/2015  Component Date Value Ref Range Status  . TSH 12/12/2015 0.99  0.35 - 4.50 uIU/mL Final  . Free T4 12/12/2015 0.76  0.60 - 1.60 ng/dL Final   Comment: Specimens from patients who are undergoing biotin therapy and /or ingesting biotin supplements may contain high levels of biotin.  The higher biotin concentration in these specimens interferes with this Free T4 assay.  Specimens that contain high levels  of biotin may cause false high results for this Free T4 assay.  Please interpret results in light of  the total clinical presentation of the patient.    . Fructosamine 12/13/2015 261  0 - 285 umol/L Final   Comment: Published reference interval for apparently healthy subjects between age 65 and 83 is 27 - 285 umol/L and in a poorly controlled diabetic population is 228 - 563 umol/L with a mean of 396 umol/L.   Marland Kitchen Sodium 12/12/2015 139  135 - 145 mEq/L Final  . Potassium 12/12/2015 4.7  3.5 - 5.1 mEq/L Final  . Chloride 12/12/2015 106  96 - 112 mEq/L Final  . CO2 12/12/2015 26  19 - 32 mEq/L Final  . Glucose, Bld 12/12/2015 121* 70 - 99 mg/dL Final  . BUN 12/12/2015 21  6 - 23 mg/dL Final  . Creatinine, Ser 12/12/2015 1.43* 0.40 - 1.20 mg/dL Final  . Calcium 12/12/2015 9.6  8.4 - 10.5 mg/dL Final  . GFR 12/12/2015 38.74* >60.00 mL/min Final  . Color, Urine 12/13/2015 YELLOW  Yellow;Lt. Yellow Final  . APPearance 12/13/2015 CLEAR  Clear Final  . Specific Gravity, Urine 12/13/2015 1.010  1.000 - 1.030 Final  . pH 12/13/2015 5.0  5.0 - 8.0 Final  . Total Protein, Urine 12/13/2015 NEGATIVE  Negative Final  . Urine Glucose 12/13/2015 >=1000* Negative Final  . Ketones, ur 12/13/2015 NEGATIVE  Negative Final  . Bilirubin Urine 12/13/2015 NEGATIVE  Negative Final  . Hgb urine dipstick 12/13/2015 SMALL* Negative Final  . Urobilinogen, UA 12/13/2015 0.2  0.0 - 1.0 Final  . Leukocytes, UA 12/13/2015 NEGATIVE  Negative Final  . Nitrite 12/13/2015 NEGATIVE  Negative Final  . WBC, UA 12/13/2015 none seen  0-2/hpf Final  . RBC / HPF 12/13/2015 0-2/hpf  0-2/hpf Final  . Squamous Epithelial / LPF 12/13/2015 Rare(0-4/hpf)  Rare(0-4/hpf) Final  . Yeast, UA 12/13/2015 Presence of* None Final  . Microalb, Ur 12/13/2015 1.0  0.0 - 1.9 mg/dL Final  . Creatinine,U 12/13/2015 54.3  mg/dL Final  . Microalb Creat Ratio 12/13/2015 1.8  0.0 - 30.0 mg/g Final    OTHER active problems: See review of systems      Medication List       Accurate as of 12/18/15 10:47 AM. Always use your most recent med  list.          ACCU-CHEK AVIVA PLUS test strip Generic drug:  glucose blood   ACCU-CHEK FASTCLIX LANCETS Misc Use 4 per day to check blood sugar dx code E11.9   B-D SINGLE USE SWABS REGULAR Pads   cilostazol 100 MG tablet Commonly known as:  PLETAL Take 1 tablet (100 mg total) by mouth 2 (two) times daily before a meal.   clopidogrel 75 MG tablet Commonly known as:  PLAVIX Take 75 mg by mouth daily.   DULoxetine 60 MG capsule Commonly known as:  CYMBALTA TAKE ONE CAPSULE BY MOUTH EVERY DAY   ferrous sulfate 325 (65 FE) MG tablet Take 325 mg by mouth 2 (two) times daily with a meal.   gabapentin 100 MG capsule Commonly known as:  NEURONTIN Take 200 mg by mouth at bedtime.   HYDROcodone-acetaminophen 10-325 MG tablet Commonly known as:  NORCO Take 1 tablet by mouth every 4 (four) hours as needed for moderate pain or severe pain.   insulin lispro 100 UNIT/ML KiwkPen Commonly known as:  HUMALOG KWIKPEN Inject 0.2 mLs (20 Units total) into the skin 3 (three) times daily.   Insulin Pen Needle 32G X 4 MM Misc Commonly known as:  BD PEN NEEDLE NANO U/F Use to inject insulin   INVOKANA 100 MG Tabs tablet Generic drug:  canagliflozin TAKE 1 TABLET BY MOUTH BEFORE BREAKFAST   KLOR-CON 10 10 MEQ tablet Generic drug:  potassium chloride TAKE 1 TABLET BY MOUTH EVERY DAY   lansoprazole 30 MG capsule Commonly known as:  PREVACID Take 1 capsule (30 mg total) by mouth 2 (two) times daily before a meal.   levothyroxine 137 MCG tablet Commonly known as:  SYNTHROID, LEVOTHROID TAKE 1 TABLET (137 MCG TOTAL) BY MOUTH DAILY BEFORE BREAKFAST.   lisinopril 5 MG tablet Commonly known as:  PRINIVIL,ZESTRIL TAKE 1 TABLET (5 MG TOTAL) BY MOUTH DAILY.   LORazepam 1 MG tablet Commonly known as:  ATIVAN Take 1 tablet (1 mg total) by mouth 3 times/day as needed-between meals & bedtime for anxiety.   metFORMIN 750 MG 24 hr tablet Commonly known as:  GLUCOPHAGE-XR TAKE 2 TABLETS BY  MOUTH EVERY DAY   nortriptyline 75 MG capsule Commonly known as:  PAMELOR Take 75 mg by mouth at bedtime.   pravastatin 20 MG tablet Commonly known as:  PRAVACHOL Take 20 mg by mouth daily.   tolterodine 4 MG 24 hr capsule Commonly known as:  DETROL LA Take 4 mg by mouth daily.   TOUJEO SOLOSTAR 300 UNIT/ML Sopn Generic drug:  Insulin Glargine INJECT 60 UNITS INTO THE SKIN 2 TIMES DAILY.       Allergies: No Known Allergies  Past Medical History:  Diagnosis Date  . Anemia   . Anginal pain ([place])    1 MONTH AGO   . Anxiety   . Arthritis   . CAD (coronary artery disease)   . CAD (coronary artery disease) of bypass graft   . 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 Colon polyps 09/11/2010   Tubular adenoma  . COPD 12/14/2008   PATIENT DENIES    . Depression   . Diabetes mellitus age 42   insulin dependent  . Diabetic coma ([place]) Feb. 2014  . Fall at home Jan. 2, 2016  Jan. 13, 2016   Left knee  . Fibromyalgia   . GERD 07/20/2006  . GERD (gastroesophageal reflux disease)   . Headache(784.0)   . History of transient ischemic attack (TIA)   . HPV (human papilloma virus) infection   . Hyperlipidemia   . Hypertension   . Hypothyroidism   . Insulin dependent type 2 diabetes mellitus, controlled ([place])   . Lumbar disc disease   . Myocardial infarction  AGE 85    . Neuromuscular disorder ([place])    PERIPHERAL NEUROPATHY  . Peripheral vascular disease ([place])   . Personal history of malignant neoplasm of kidney(V10.52) 12/14/2008   Laparoscopic biopsy and cryoablation 7/08 Dr. Vernie Shanks   . Stroke [facility])    3 MINI STROKES  RT SIDED WEAKNESS  . Vertigo     Past Surgical History:  Procedure Laterality Date  . ABDOMINAL AORTAGRAM N/A 08/21/2011   Procedure: ABDOMINAL Maxcine Ham;  Surgeon: Elam Dutch, MD;  Location: [facility] C[facility] LAB;  Service: Cardiovascular;  Laterality: N/A;  . ABDOMINAL AORTAGRAM N/A 03/16/2014   Procedure: ABDOMINAL  Maxcine Ham;  Surgeon: Elam Dutch, MD;  Location: [facility] CATH LAB;  Service: Cardiovascular;  Laterality: N/A;  . ABDOMINAL HYSTERECTOMY    . APPENDECTOMY    . BLADDER SUSPENSION    . CARDIAC CATHETERIZATION    . CHOLECYSTECTOMY OPEN    . ESOPHAGOGASTRODUODENOSCOPY (EGD) WITH PROPOFOL N/A 05/16/2014   Procedure: ESOPHAGOGASTRODUODENOSCOPY (EGD) WITH PROPOFOL with Balloon dilation;  Surgeon: Milus Banister, MD;  Location: [place];  Service: Endoscopy;  Laterality: N/A;  . ESOPHAGOGASTRODUODENOSCOPY (EGD) WITH PROPOFOL N/A 09/02/2015   Procedure: ESOPHAGOGASTRODUODENOSCOPY (EGD) WITH PROPOFOL;  Surgeon: Doran Stabler, MD;  Location: [place];  Service: Gastroenterology;  Laterality: N/A;  . FEMORAL-POPLITEAL BYPASS GRAFT  10/12/2011   Procedure: BYPASS GRAFT FEMORAL-POPLITEAL ARTERY;  Surgeon: Elam Dutch, MD;  Location[facility] OR;  Service: Vascular;  Laterality: Left;  Left Femoral-Popliteal Bypass Graft using 67mm x 80cm Propaten Graft with intraop arteriogram times one.  . FEMORAL-POPLITEAL BYPASS GRAFT Left 04/17/2014   Procedure: REDO LEFT FEMORAL-POPLITEAL ARTERY BYPASS USING GORE PROPATEN 89mmx80cm GRAFT;  Surgeon: Elam Dutch, MD;  Location: [place];  Service: Vascular;  Laterality: Left;  . FOOT SURGERY Left    "bone spurs"  . INTRAOPERATIVE ARTERIOGRAM  10/12/2011   Procedure: INTRA OPERATIVE ARTERIOGRAM;  Surgeon: Elam Dutch, MD;  Location: [place];  Service: Vascular;  Laterality: Left;  . KNEE ARTHROSCOPY Bilateral    "cartilage"  . Laparascopic cryoablation of left kidney  08/2006   Dr. Gladis Riffle for renal cell cancer  . LEFT HEART CATHETERIZATION WITH CORONARY ANGIOGRAM N/A 05/11/2011   Procedure: LEFT HEART CATHETERIZATION WITH CORONARY ANGIOGRAM;  Surgeon: Jolaine Artist, MD;  Location: [facility] CATH LAB;  Service: Cardiovascular;  Laterality: N/A;  . LUNG SURGERY Right    lung nodule removed from the right side  . PR VEIN BYPASS GRAFT,AORTO-FEM-POP  05/27/2010  .  SHOULDER ARTHROSCOPY Bilateral    'Spurs"  . TUBAL LIGATION      Family History  Problem Relation Age of Onset  . Heart disease Mother   . Lung cancer Mother   . Cancer Mother     Lung  . Heart disease Father   . Lung cancer Father   . Cancer Father     Lung  . Heart disease Sister     CABG- Open Heart    Social History:  reports that she quit smoking about 17 months ago. Her smoking use included Cigarettes. She has a 12.50 pack-year smoking history. She has never used smokeless tobacco. She reports that she does not drink alcohol or use drugs.    Review of Systems   Lipid history:  She has good control with taking pravastatin but has relatively high triglycerides    Lab Results  Component Value Date   CHOL 152 08/02/2015   HDL 40.30 08/02/2015   LDLCALC 76 02/23/2008  LDLDIRECT 82.0 08/02/2015   TRIG 239.0 (H) 08/02/2015   CHOLHDL 4 08/02/2015            Hypertension: Not clear what she is taking.  She thinks she is getting a combination of 2 Rxs    Neurological:    Has no numbness, burning but does have pains , burning and some tingling in feet.   Symptoms better with  Cymbalta  60 mg She is being treated with nortriptyline by PCP also  She has had  peripheral vascular disease and Taking Pletal from vascular surgeon  Long history of thyroid disease, Has had post ablative hypothyroidism, on 137ug with recently good TSH   Lab Results  Component Value Date   TSH 0.99 12/12/2015    Last  Foot exam findings: Monofilament sensation is markedly reduced across most of the toes and distal plantar surfaces  Physical Examination:  BP 118/66   Pulse 60   Ht 5\' 1"  (1.549 m)   Wt 167 lb (75.8 kg)   SpO2 95%   BMI 31.55 kg/m     ASSESSMENT:   Diabetes type 2, uncontrolled   See history of present illness for detailed discussion of his current management, blood sugar patterns and problems identified  Her blood sugars are overall not consistently controlled  With variable readings postprandially This is due to inconsistent diet and snacks She also has difficulty adjusting her NovoLog based on what she is eating Fructosamine up 261 and reflects fairly good overall blood sugars No hypoglycemia except if she takes too much Humalog to cover her lunch Not checking her blood sugars fasting  PLAN:     She will discuss meal planning with dietitian.  She will avoid snacks especially with foods like grapes and cookies unless eating at meal time and will adjust her NovoLog accordingly for this.  For smaller meals at lunchtime she can take 6-7 units of Novolog  She will try taking 12 units of NovoLog consistently at suppertime to help post prandial control  No change in Toujeo at present since she is having fairly good readings in the mornings usually  Continue Invokana and metformin  HYPERTENSION: Blood pressure is normal, she will call to let us know what medications her PCP has given her  HYPOTHYROIDISM: TSH is normal now with 137 dose  Patient Instructions  Novolog 4-6 units for any snacks   Take at least 12 Novolog at supper  Adjust Novolog based on meal size, < 10 if small lunch          Counseling time on subjects discussed above is over 50% of today's 25 minute visit  Linah Klapper 12/18/2015, 10:47 AM   Note: This office note was prepared with Estate agent. Any transcriptional errors that result from this process are unintentional.

## 2015-12-18 NOTE — Patient Instructions (Addendum)
Novolog 4-6 units for any snacks   Take at least 12 Novolog at supper  Adjust Novolog based on meal size, < 10 if small lunch  Reduce snacks on fruits  Check blood sugars on waking up 3x weekly   Also check blood sugars about 2 hours after a meal and do this after different meals by rotation  Recommended blood sugar levels on waking up is 90-130 and about 2 hours after meal is 130-160  Please bring your blood sugar monitor to each visit, thank you

## 2015-12-20 ENCOUNTER — Other Ambulatory Visit: Payer: Self-pay

## 2015-12-20 MED ORDER — METFORMIN HCL ER 750 MG PO TB24: 1500.0000 mg | ORAL_TABLET | Freq: Every day | ORAL | 1 refills | Status: DC

## 2015-12-20 MED ORDER — METFORMIN HCL ER 750 MG PO TB24: 1500.0000 mg | ORAL_TABLET | Freq: Every day | ORAL | 3 refills | Status: DC

## 2015-12-25 ENCOUNTER — Encounter: Payer: Self-pay | Admitting: Family Medicine

## 2015-12-26 ENCOUNTER — Other Ambulatory Visit: Payer: Medicare Other

## 2016-01-01 ENCOUNTER — Other Ambulatory Visit: Payer: Self-pay | Admitting: Endocrinology

## 2016-01-03 ENCOUNTER — Other Ambulatory Visit: Payer: Self-pay | Admitting: Endocrinology

## 2016-01-03 ENCOUNTER — Encounter: Payer: Medicare Other | Admitting: Dietician

## 2016-01-03 DIAGNOSIS — Z794 Long term (current) use of insulin: Principal | ICD-10-CM

## 2016-01-03 DIAGNOSIS — E1165 Type 2 diabetes mellitus with hyperglycemia: Secondary | ICD-10-CM

## 2016-01-22 ENCOUNTER — Other Ambulatory Visit: Payer: Self-pay | Admitting: Endocrinology

## 2016-02-04 ENCOUNTER — Other Ambulatory Visit: Payer: Self-pay | Admitting: Endocrinology

## 2016-02-18 ENCOUNTER — Other Ambulatory Visit: Payer: Self-pay

## 2016-02-18 MED ORDER — POTASSIUM CHLORIDE ER 10 MEQ PO TBCR: 10.0000 meq | EXTENDED_RELEASE_TABLET | Freq: Every day | ORAL | 3 refills | Status: DC

## 2016-02-18 MED ORDER — DULOXETINE HCL 60 MG PO CPEP: 60.0000 mg | ORAL_CAPSULE | Freq: Every day | ORAL | 3 refills | Status: DC

## 2016-02-25 ENCOUNTER — Other Ambulatory Visit: Payer: Self-pay | Admitting: Endocrinology

## 2016-02-25 MED ORDER — EMPAGLIFLOZIN 10 MG PO TABS: 10.0000 mg | ORAL_TABLET | Freq: Every day | ORAL | 3 refills | Status: DC

## 2016-02-26 ENCOUNTER — Emergency Department (HOSPITAL_COMMUNITY)
Admission: EM | Admit: 2016-02-26 | Discharge: 2016-02-26 | Disposition: A | Discharge status: Home / Self Care | Payer: Medicare Other | Attending: Physician Assistant | Admitting: Physician Assistant

## 2016-02-26 ENCOUNTER — Encounter (HOSPITAL_COMMUNITY): Payer: Self-pay | Admitting: Emergency Medicine

## 2016-02-26 ENCOUNTER — Emergency Department (HOSPITAL_COMMUNITY): Payer: Medicare Other

## 2016-02-26 DIAGNOSIS — I2510 Atherosclerotic heart disease of native coronary artery without angina pectoris: Secondary | ICD-10-CM | POA: Diagnosis not present

## 2016-02-26 DIAGNOSIS — Z87891 Personal history of nicotine dependence: Secondary | ICD-10-CM | POA: Insufficient documentation

## 2016-02-26 DIAGNOSIS — I252 Old myocardial infarction: Secondary | ICD-10-CM | POA: Insufficient documentation

## 2016-02-26 DIAGNOSIS — J449 Chronic obstructive pulmonary disease, unspecified: Secondary | ICD-10-CM | POA: Insufficient documentation

## 2016-02-26 DIAGNOSIS — S8991XA Unspecified injury of right lower leg, initial encounter: Secondary | ICD-10-CM | POA: Diagnosis present

## 2016-02-26 DIAGNOSIS — Y999 Unspecified external cause status: Secondary | ICD-10-CM | POA: Diagnosis not present

## 2016-02-26 DIAGNOSIS — E039 Hypothyroidism, unspecified: Secondary | ICD-10-CM | POA: Diagnosis not present

## 2016-02-26 DIAGNOSIS — Y929 Unspecified place or not applicable: Secondary | ICD-10-CM | POA: Insufficient documentation

## 2016-02-26 DIAGNOSIS — S8001XA Contusion of right knee, initial encounter: Secondary | ICD-10-CM | POA: Diagnosis not present

## 2016-02-26 DIAGNOSIS — I10 Essential (primary) hypertension: Secondary | ICD-10-CM | POA: Insufficient documentation

## 2016-02-26 DIAGNOSIS — Z85528 Personal history of other malignant neoplasm of kidney: Secondary | ICD-10-CM | POA: Diagnosis not present

## 2016-02-26 DIAGNOSIS — W010XXA Fall on same level from slipping, tripping and stumbling without subsequent striking against object, initial encounter: Secondary | ICD-10-CM | POA: Diagnosis not present

## 2016-02-26 DIAGNOSIS — Y939 Activity, unspecified: Secondary | ICD-10-CM | POA: Insufficient documentation

## 2016-02-26 DIAGNOSIS — E119 Type 2 diabetes mellitus without complications: Secondary | ICD-10-CM | POA: Insufficient documentation

## 2016-02-26 DIAGNOSIS — Z794 Long term (current) use of insulin: Secondary | ICD-10-CM | POA: Insufficient documentation

## 2016-02-26 MED ORDER — OXYCODONE-ACETAMINOPHEN 5-325 MG PO TABS
1.0000 | ORAL_TABLET | Freq: Once | ORAL | Status: AC
  Administered 2016-02-26: 1 via ORAL
  Filled 2016-02-26: qty 1

## 2016-02-26 NOTE — ED Notes (Signed)
..  Video visit coupon no. [id number] and handout given to pt. on discharge .

## 2016-02-26 NOTE — ED Triage Notes (Signed)
Pt presents to ED for assessment of right knee pain after a trip and fall yesterday.  Pt sts today it is swollen, warm and painful.  Pt c/o some back pain as well.  Denies any head injury or LOC.

## 2016-02-26 NOTE — Progress Notes (Signed)
Orthopedic Tech Progress Note Patient Details:  April Branch [birth year] [id number]  Ortho Devices Type of Ortho Device: Knee Sleeve Ortho Device/Splint Location: rle Ortho Device/Splint Interventions: Ordered, Application   Karolee Stamps 02/26/2016, 11:10 PM

## 2016-02-26 NOTE — ED Notes (Signed)
EDP explained x-ray result and plan of care to pt.  

## 2016-02-26 NOTE — ED Provider Notes (Signed)
[facility] DEPT Provider Note   CSN: [id number] Arrival date & time: 02/26/16  2023  By signing my name below, I, Neta Mends, attest that this documentation has been prepared under the direction and in the presence of Debroah Baller, NP. Electronically Signed: Neta Mends, ED Scribe. 02/26/2016. 10:36 PM.   History   Chief Complaint Chief Complaint  Patient presents with  . Knee Pain    The history is provided by the patient. No language interpreter was used.  Knee Pain   This is a new problem. The current episode started 2 days ago. The problem occurs constantly. The problem has not changed since onset.The pain is present in the right knee. Pertinent negatives include no numbness. She has tried nothing for the symptoms.   HPI Comments:  April Branch is a 71 y.o. female who presents to the Emergency Department complaining of right knee pain due to fall 2 days ago. Pt reports that she fell down after tripping on a rug, landing on her right knee with all of her weight. Pt complains of associated associated swelling to the area. Pt denies head injury during the fall and denies LOC. Pt is not ambulatory and arrived to the exam room in a wheelchair. Pt takes Plavix. No alleviating factors noted. Pt denies other associated symptoms. Patient is currently in a pain management program for her chronic pain and takes Percocet 10/325. She states she is out of her medication.   Past Medical History:  Diagnosis Date  . Anemia   . Anginal pain ([place])    1 MONTH AGO   . Anxiety   . Arthritis   . CAD (coronary artery disease)   . CAD (coronary artery disease) of bypass graft   . 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 Colon polyps 09/11/2010   Tubular adenoma  . COPD 12/14/2008   PATIENT DENIES    . Depression   . Diabetes mellitus age 58   insulin dependent  . Diabetic coma ([place]) Feb. 2014  . Fall at home Jan. 2, 2016  Jan. 13, 2016   Left knee  . Fibromyalgia   . GERD 07/20/2006  . GERD (gastroesophageal reflux disease)   . Headache(784.0)   . History of transient ischemic attack (TIA)   . HPV (human papilloma virus) infection   . Hyperlipidemia   . Hypertension   . Hypothyroidism   . Insulin dependent type 2 diabetes mellitus, controlled ([place])   . Lumbar disc disease   . Myocardial infarction    AGE 43    . Neuromuscular disorder ([place])    PERIPHERAL NEUROPATHY  . Peripheral vascular disease ([place])   . Personal history of malignant neoplasm of kidney(V10.52) 12/14/2008   Laparoscopic biopsy and cryoablation 7/08 Dr. Vernie Shanks   . Stroke [facility])    3 MINI STROKES  RT SIDED WEAKNESS  . Vertigo     Patient Active Problem List   Diagnosis Date Noted  . Drowsiness 09/03/2015  . Food impaction of esophagus 09/02/2015  . Hypotension 06/24/2015  . Diabetic foot ulcer ([place]) 06/24/2015  . Atheroscler of bypass graft of left leg with intermittent claudication ([place]) 04/17/2014  . PAD (peripheral artery disease) ([place]) 03/16/2014  . Occlusion and stenosis of carotid artery without mention of cerebral infarction 05/19/2013  . Chest pain 09/21/2012  . Nausea vomiting and diarrhea 09/21/2012  . Acute kidney injury (nontraumatic) ([place]) 09/21/2012  . Chronic pain syndrome 03/13/2012  .  Acute kidney injury ([place]) 03/13/2012  . Encephalopathy, toxic 03/12/2012  . Pain in limb 10/22/2011  . Atherosclerosis of native arteries of the extremities with intermittent claudication 10/08/2011  . Peripheral vascular disease, unspecified 07/30/2011  . CAD (coronary artery disease) 05/10/2011  . Hyperlipidemia   . Unstable angina ([place]) 05/09/2011  . Peripheral vascular disease ([place]) 12/19/2008  . CERVICAL CANCER 12/14/2008  . Personal history of malignant neoplasm of kidney(V10.52) 12/14/2008  . MI 12/14/2008  . COPD (chronic obstructive pulmonary disease) ([place]) 12/14/2008  . IBS 12/14/2008  . Essential hypertension   . Personal  history of colonic polyps 08/13/2008  . DISORDER, TOBACCO USE 08/04/2006  . Hypothyroidism   . Insulin dependent type 2 diabetes mellitus, controlled ([place])   . Lumbar disc disease   . ANXIETY STATE NOS 07/20/2006  . GERD 07/20/2006  . HX, PERSONAL, VENOUS THROMBOSIS/EMBOLISM 07/20/2006    Past Surgical History:  Procedure Laterality Date  . ABDOMINAL AORTAGRAM N/A 08/21/2011   Procedure: ABDOMINAL Maxcine Ham;  Surgeon: Elam Dutch, MD;  Location: [facility] CATH LAB;  Service: Cardiovascular;  Laterality: N/A;  . ABDOMINAL AORTAGRAM N/A 03/16/2014   Procedure: ABDOMINAL Maxcine Ham;  Surgeon: Elam Dutch, MD;  Location: [facility] CATH LAB;  Service: Cardiovascular;  Laterality: N/A;  . ABDOMINAL HYSTERECTOMY    . APPENDECTOMY    . BLADDER SUSPENSION    . CARDIAC CATHETERIZATION    . CHOLECYSTECTOMY OPEN    . ESOPHAGOGASTRODUODENOSCOPY (EGD) WITH PROPOFOL N/A 05/16/2014   Procedure: ESOPHAGOGASTRODUODENOSCOPY (EGD) WITH PROPOFOL with Balloon dilation;  Surgeon: Milus Banister, MD;  Location: [facility];  Service: Endoscopy;  Laterality: N/A;  . ESOPHAGOGASTRODUODENOSCOPY (EGD) WITH PROPOFOL N/A 09/02/2015   Procedure: ESOPHAGOGASTRODUODENOSCOPY (EGD) WITH PROPOFOL;  Surgeon: Doran Stabler, MD;  Location: [facility];  Service: Gastroenterology;  Laterality: N/A;  . FEMORAL-POPLITEAL BYPASS GRAFT  10/12/2011   Procedure: BYPASS GRAFT FEMORAL-POPLITEAL ARTERY;  Surgeon: Elam Dutch, MD;  Location[facility];  Service: Vascular;  Laterality: Left;  Left Femoral-Popliteal Bypass Graft using 62mm x 80cm Propaten Graft with intraop arteriogram times one.  . FEMORAL-POPLITEAL BYPASS GRAFT Left 04/17/2014   Procedure: REDO LEFT FEMORAL-POPLITEAL ARTERY BYPASS USING GORE PROPATEN 82mmx80cm GRAFT;  Surgeon: Elam Dutch, MD;  Location: [facility];  Service: Vascular;  Laterality: Left;  . FOOT SURGERY Left    "bone spurs"  . INTRAOPERATIVE ARTERIOGRAM  10/12/2011   Procedure: INTRA OPERATIVE ARTERIOGRAM;   Surgeon: Elam Dutch, MD;  Location: [facility];  Service: Vascular;  Laterality: Left;  . KNEE ARTHROSCOPY Bilateral    "cartilage"  . Laparascopic cryoablation of left kidney  08/2006   Dr. Gladis Riffle for renal cell cancer  . LEFT HEART CATHETERIZATION WITH CORONARY ANGIOGRAM N/A 05/11/2011   Procedure: LEFT HEART CATHETERIZATION WITH CORONARY ANGIOGRAM;  Surgeon: Jolaine Artist, MD;  Location: [facility] CATH LAB;  Service: Cardiovascular;  Laterality: N/A;  . LUNG SURGERY Right    lung nodule removed from the right side  . PR VEIN BYPASS GRAFT,AORTO-FEM-POP  05/27/2010  . SHOULDER ARTHROSCOPY Bilateral    'Spurs"  . TUBAL LIGATION      OB History    No data available       Home Medications    Prior to Admission medications   Medication Sig Start Date End Date Taking? Authorizing Provider  ACCU-CHEK AVIVA PLUS test strip  01/24/14   Historical Provider, MD  ACCU-CHEK FASTCLIX LANCETS MISC Use 4 per day to check blood sugar dx code E11.9 11/29/14  Elayne Snare, MD  Alcohol Swabs (B-D SINGLE USE SWABS REGULAR) PADS  03/21/13   Historical Provider, MD  cilostazol (PLETAL) 100 MG tablet Take 1 tablet (100 mg total) by mouth 2 (two) times daily before a meal. 11/28/15   Sharmon Leyden Nickel, NP  clopidogrel (PLAVIX) 75 MG tablet Take 75 mg by mouth daily.  04/02/14   Historical Provider, MD  DULoxetine (CYMBALTA) 60 MG capsule Take 1 capsule (60 mg total) by mouth daily. 02/27/16   Elayne Snare, MD  empagliflozin (JARDIANCE) 10 MG TABS tablet Take 10 mg by mouth daily. 02/25/16   Elayne Snare, MD  ferrous sulfate 325 (65 FE) MG tablet Take 325 mg by mouth 2 (two) times daily with a meal.    Historical Provider, MD  gabapentin (NEURONTIN) 100 MG capsule Take 200 mg by mouth at bedtime.  01/07/15   Historical Provider, MD  HUMALOG KWIKPEN 100 UNIT/ML KiwkPen INJECT 20 UNITS 3 TIMES A DAY 01/02/16   Elayne Snare, MD  HYDROcodone-acetaminophen (NORCO) 10-325 MG tablet Take 1 tablet by mouth every 4 (four) hours as  needed for moderate pain or severe pain. 06/28/15   Elwin Mocha, MD  Insulin Pen Needle (BD PEN NEEDLE NANO U/F) 32G X 4 MM MISC Use to inject insulin 10/30/14   Elayne Snare, MD  lansoprazole (PREVACID) 30 MG capsule Take 1 capsule (30 mg total) by mouth 2 (two) times daily before a meal. Patient taking differently: Take 30 mg by mouth daily.  05/16/14   Milus Banister, MD  levothyroxine (SYNTHROID, LEVOTHROID) 137 MCG tablet TAKE 1 TABLET (137 MCG TOTAL) BY MOUTH DAILY BEFORE BREAKFAST. 02/04/16   Elayne Snare, MD  lisinopril (PRINIVIL,ZESTRIL) 5 MG tablet TAKE 1 TABLET (5 MG TOTAL) BY MOUTH DAILY. 04/22/15   Elayne Snare, MD  LORazepam (ATIVAN) 1 MG tablet Take 1 tablet (1 mg total) by mouth 3 times/day as needed-between meals & bedtime for anxiety. Patient not taking: Reported on 12/18/2015 06/28/15   Elwin Mocha, MD  metFORMIN (GLUCOPHAGE-XR) 750 MG 24 hr tablet Take 2 tablets (1,500 mg total) by mouth daily. 12/20/15   Elayne Snare, MD  nortriptyline (PAMELOR) 75 MG capsule Take 75 mg by mouth at bedtime. 01/24/14   Historical Provider, MD  potassium chloride (KLOR-CON 10) 10 MEQ tablet Take 1 tablet (10 mEq total) by mouth daily. 02/27/16   Elayne Snare, MD  pravastatin (PRAVACHOL) 20 MG tablet Take 20 mg by mouth daily.    Historical Provider, MD  tolterodine (DETROL LA) 4 MG 24 hr capsule Take 4 mg by mouth daily.     Historical Provider, MD  TOUJEO SOLOSTAR 300 UNIT/ML SOPN INJECT 60 UNITS INTO THE SKIN 2 TIMES DAILY. Patient taking differently: INJECT 60 UNITS INTO THE SKIN 1 TIME DAILY. 08/02/15   Elayne Snare, MD    Family History Family History  Problem Relation Age of Onset  . Heart disease Mother   . Lung cancer Mother   . Cancer Mother     Lung  . Heart disease Father   . Lung cancer Father   . Cancer Father     Lung  . Heart disease Sister     CABG- Open Heart    Social History Social History  Substance Use Topics  . Smoking status: Former Smoker    Packs/day: 0.25    Years:  50.00    Types: Cigarettes    Quit date: 06/24/2014  . Smokeless tobacco: Never Used  . Alcohol use No  Allergies   Patient has no known allergies.   Review of Systems Review of Systems  Musculoskeletal: Positive for arthralgias and back pain (chronic ). Myalgias: chronic.       Right knee pain that is acute  Neurological: Negative for numbness.     Physical Exam Updated Vital Signs BP 163/61 (BP Location: Left Arm)   Pulse 64   Temp 98.4 F (36.9 C) (Oral)   Resp 17   Ht 5\' 1"  (1.549 m)   Wt 75.8 kg   SpO2 100%   BMI 31.55 kg/m   Physical Exam  Constitutional: She is oriented to person, place, and time. She appears well-developed and well-nourished. No distress.  HENT:  Head: Normocephalic and atraumatic.  Eyes: Conjunctivae are normal.  Neck: Neck supple.  Cardiovascular: Normal rate and intact distal pulses.   Pulmonary/Chest: Effort normal.  Abdominal: She exhibits no distension.  Musculoskeletal:       Right knee: She exhibits decreased range of motion (due to pain), swelling and ecchymosis. She exhibits no deformity, no laceration, no erythema, normal alignment and normal patellar mobility. Tenderness found. MCL and LCL tenderness noted.  Pedal pulse 2+, adequate circulation.  Neurological: She is alert and oriented to person, place, and time.  Skin: Skin is warm and dry.  Psychiatric: She has a normal mood and affect. Her behavior is normal.  Nursing note and vitals reviewed.    ED Treatments / Results  DIAGNOSTIC STUDIES:  Oxygen Saturation is 99% on RA, normal by my interpretation.    COORDINATION OF CARE:  10:36 PM Discussed treatment plan with pt at bedside and pt agreed to plan.   Pt evaluated by Dr. Thomasene Lot discussed pain management by her clinic doctor rather than the ED. Will treat her pain at this time but no Rx.  Labs (all labs ordered are listed, but only abnormal results are displayed) Labs Reviewed - No data to  display   Radiology Dg Knee Complete 4 Views Right  Result Date: 02/26/2016 CLINICAL DATA:  Pain after falling onto a hardwood floor yesterday. EXAM: RIGHT KNEE - COMPLETE 4+ VIEW COMPARISON:  None. FINDINGS: No evidence of fracture, dislocation, or joint effusion. No evidence of arthropathy or other focal bone abnormality. Soft tissues are unremarkable. IMPRESSION: Negative. Electronically Signed   By: Andreas Newport M.D.   On: 02/26/2016 21:21    Procedures Procedures (including critical care time)  Medications Ordered in ED Medications  oxyCODONE-acetaminophen (PERCOCET/ROXICET) 5-325 MG per tablet 1 tablet (1 tablet Oral Given 02/26/16 2302)     Initial Impression / Assessment and Plan / ED Course  Patient without signs of serious head, neck, or back injury.    Normal muscle soreness after fall. Due to pts normal radiology & ability to ambulate in ED pt will be dc home with symptomatic therapy. Pt has been instructed to follow up with their doctor for continued pain management. Discussed with the patient that we do not refill narcotic pain medication in the ED and that her pain management doctor should be aware that she is running out of medication prior to refill time. Home conservative therapies for pain including ice and heat tx have been discussed. Pt is hemodynamically stable, in NAD, & able to ambulate in the ED. Return precautions discussed.  I have reviewed the triage vital signs and the nursing notes.  Pertinent imaging results that were available during my care of the patient were reviewed by me and considered in my medical decision making (see chart  for details).  Clinical Course    Final Clinical Impressions(s) / ED Diagnoses   Final diagnoses:  Contusion of right knee, initial encounter    New Prescriptions Discharge Medication List as of 02/26/2016 11:03 PM    I personally performed the services described in this documentation, which was scribed in my presence. The  recorded information has been reviewed and is accurate.     [place], NP 02/27/16 [place], MD 03/05/16 1147

## 2016-02-26 NOTE — Discharge Instructions (Signed)
Call your doctor in the morning for follow up.

## 2016-02-27 ENCOUNTER — Other Ambulatory Visit: Payer: Self-pay

## 2016-02-27 MED ORDER — DULOXETINE HCL 60 MG PO CPEP: 60.0000 mg | ORAL_CAPSULE | Freq: Every day | ORAL | 1 refills | Status: DC

## 2016-02-27 MED ORDER — POTASSIUM CHLORIDE ER 10 MEQ PO TBCR: 10.0000 meq | EXTENDED_RELEASE_TABLET | Freq: Every day | ORAL | 1 refills | Status: DC

## 2016-03-05 ENCOUNTER — Ambulatory Visit (HOSPITAL_COMMUNITY): Payer: Medicare Other

## 2016-03-05 ENCOUNTER — Ambulatory Visit: Payer: Medicare Other | Admitting: Family

## 2016-03-06 ENCOUNTER — Encounter: Payer: Self-pay | Admitting: Family

## 2016-03-11 ENCOUNTER — Ambulatory Visit: Payer: Medicare Other | Admitting: Family

## 2016-03-11 ENCOUNTER — Ambulatory Visit (HOSPITAL_COMMUNITY): Payer: Medicare Other

## 2016-03-13 ENCOUNTER — Other Ambulatory Visit: Payer: Medicare Other

## 2016-03-17 ENCOUNTER — Other Ambulatory Visit: Payer: Self-pay | Admitting: *Deleted

## 2016-03-17 DIAGNOSIS — I739 Peripheral vascular disease, unspecified: Secondary | ICD-10-CM

## 2016-03-17 MED ORDER — CILOSTAZOL 100 MG PO TABS: 100.0000 mg | ORAL_TABLET | Freq: Two times a day (BID) | ORAL | 2 refills | Status: DC

## 2016-03-18 ENCOUNTER — Ambulatory Visit: Payer: Medicare Other | Admitting: Endocrinology

## 2016-03-18 ENCOUNTER — Other Ambulatory Visit: Payer: Self-pay

## 2016-03-18 MED ORDER — POTASSIUM CHLORIDE ER 10 MEQ PO TBCR: 10.0000 meq | EXTENDED_RELEASE_TABLET | Freq: Every day | ORAL | 1 refills | Status: DC

## 2016-03-19 ENCOUNTER — Other Ambulatory Visit: Payer: Self-pay

## 2016-03-19 MED ORDER — DULOXETINE HCL 60 MG PO CPEP: 60.0000 mg | ORAL_CAPSULE | Freq: Every day | ORAL | 1 refills | Status: DC

## 2016-03-19 MED ORDER — POTASSIUM CHLORIDE ER 10 MEQ PO TBCR: 10.0000 meq | EXTENDED_RELEASE_TABLET | Freq: Every day | ORAL | 1 refills | Status: DC

## 2016-03-23 ENCOUNTER — Encounter: Payer: Self-pay | Admitting: Cardiology

## 2016-03-23 ENCOUNTER — Encounter: Payer: Self-pay | Admitting: Family

## 2016-03-27 ENCOUNTER — Ambulatory Visit (INDEPENDENT_AMBULATORY_CARE_PROVIDER_SITE_OTHER): Payer: Medicare Other | Admitting: Family

## 2016-03-27 ENCOUNTER — Encounter: Payer: Self-pay | Admitting: Family

## 2016-03-27 ENCOUNTER — Ambulatory Visit (HOSPITAL_COMMUNITY)
Admission: RE | Admit: 2016-03-27 | Discharge: 2016-03-27 | Disposition: A | Discharge status: Home / Self Care | Payer: Medicare Other | Source: Ambulatory Visit | Attending: Family | Admitting: Family

## 2016-03-27 VITALS — BP 174/77 | HR 69 | Temp 98.7°F | Resp 18 | Ht 61.0 in | Wt 172.0 lb

## 2016-03-27 DIAGNOSIS — I70212 Atherosclerosis of native arteries of extremities with intermittent claudication, left leg: Secondary | ICD-10-CM

## 2016-03-27 DIAGNOSIS — T82898D Other specified complication of vascular prosthetic devices, implants and grafts, subsequent encounter: Secondary | ICD-10-CM | POA: Diagnosis not present

## 2016-03-27 DIAGNOSIS — Z87891 Personal history of nicotine dependence: Secondary | ICD-10-CM

## 2016-03-27 DIAGNOSIS — E1151 Type 2 diabetes mellitus with diabetic peripheral angiopathy without gangrene: Secondary | ICD-10-CM | POA: Diagnosis not present

## 2016-03-27 DIAGNOSIS — Z95828 Presence of other vascular implants and grafts: Secondary | ICD-10-CM

## 2016-03-27 DIAGNOSIS — E1165 Type 2 diabetes mellitus with hyperglycemia: Secondary | ICD-10-CM

## 2016-03-27 DIAGNOSIS — IMO0002 Reserved for concepts with insufficient information to code with codable children: Secondary | ICD-10-CM

## 2016-03-27 DIAGNOSIS — I6523 Occlusion and stenosis of bilateral carotid arteries: Secondary | ICD-10-CM

## 2016-03-27 NOTE — Progress Notes (Signed)
[facility] [place]   CC: Follow up peripheral artery occlusive disease  History of Present Illness April Branch is a 71 y.o. female patient of Dr. Donzetta Matters who is status post left femoropopliteal bypass graft with propaten on 10/12/2011 with subsequent occlusion, and left femoral to below-knee popliteal bypass with propaten on 04/17/2014, by Dr. Oneida Alar.  She also has a history of carotid artery stenosis.   She returns today for follow up. The dogbite wound on her left ankle, medial aspect, has completely healed.  Baseline she has peripheral neuropathy pain in both feet, gabapentin helps slightly with this.  Left hand pain/weakness sx's have been present for years, states this has stablized, she has trouble holding on to objects in her left hand.  She denies non healing wounds. States she had brain imaging about 2010 which demonstrated 3 TIA'S, has intermittent dizziness, has tingling and numbness in both hands, weakness in both hands, moreso in left.   She has known lumbar spine issues with radiculopathy in both legs.   She no longer has dry cracked fissures on both heals. She has no claudication symptoms in her right leg. Her left calf hurts after walking about 1 mile, relieved with rest.  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   She had severe headaches several years ago which prompted brain imaging, states was found she had 3 ministrokes, she does not recall any TIA symptoms other than the headache, denies lateralizing symptoms.  She was hospitalized May 2017 at [facility] for dehydration, does not know how she got dehydrated.   Pt states her B12 level has not been checked, she reports stable ataxia.  She fell on her right knee, seen in [facility] ED on 02-26-16, contusion was the diagnosis; states she has been walking less due to this.   Pt  states her blood pressure at home runs about 120/87.   Pt Diabetic: Yes, 7.6 A1C on 10/24/15 (review of records); 8.4 on 05/20/15; this is an improvement since she has been seeing Dr. [name]  Pt smoker: formersmoker (quit January 2016, started at age 58 yrs) Pt meds include:  Statin :Yes ASA: No Other anticoagulants/antiplatelets: Plavix      Past Medical History:  Diagnosis Date  . Anemia   . Anginal pain ([place])    1 MONTH AGO   . Anxiety   . Arthritis   . CAD (coronary artery disease)   . CAD (coronary artery disease) of bypass graft   . 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 Colon polyps 09/11/2010   Tubular adenoma  . COPD 12/14/2008   PATIENT DENIES    . Depression   . Diabetes mellitus age 70   insulin dependent  . Diabetic coma ([place]) Feb. 2014  . Fall at home Jan. 2, 2016  Jan. 13, 2016   Left knee  . Fibromyalgia   . GERD 07/20/2006  . GERD (gastroesophageal reflux disease)   . Headache(784.0)   . History of transient ischemic attack (TIA)   . HPV (human papilloma virus) infection   . Hyperlipidemia   . Hypertension   . Hypothyroidism   . Insulin dependent type 2 diabetes mellitus, controlled ([place])   . Lumbar disc disease   . Myocardial infarction    AGE 13    .  Neuromuscular disorder ([place])    PERIPHERAL NEUROPATHY  . Peripheral vascular disease ([place])   . Personal history of malignant neoplasm of kidney(V10.52) 12/14/2008   Laparoscopic biopsy and cryoablation 7/08 Dr. Vernie Shanks   . Stroke [facility])    3 MINI STROKES  RT SIDED WEAKNESS  . Vertigo     Social History Social History  Substance Use Topics  . Smoking status: Former Smoker    Packs/day: 0.25    Years: 50.00    Types: Cigarettes    Quit date: 06/24/2014  . Smokeless tobacco: Never Used  . Alcohol use No    Family History Family History  Problem Relation Age of Onset  . Heart disease Mother   . Lung cancer Mother   . Cancer Mother     Lung  .  Heart disease Father   . Lung cancer Father   . Cancer Father     Lung  . Heart disease Sister     CABG- Open Heart    Past Surgical History:  Procedure Laterality Date  . ABDOMINAL AORTAGRAM N/A 08/21/2011   Procedure: ABDOMINAL Maxcine Ham;  Surgeon: Elam Dutch, MD;  Location: [facility] CATH LAB;  Service: Cardiovascular;  Laterality: N/A;  . ABDOMINAL AORTAGRAM N/A 03/16/2014   Procedure: ABDOMINAL Maxcine Ham;  Surgeon: Elam Dutch, MD;  Location: [facility] CATH LAB;  Service: Cardiovascular;  Laterality: N/A;  . ABDOMINAL HYSTERECTOMY    . APPENDECTOMY    . BLADDER SUSPENSION    . CARDIAC CATHETERIZATION    . CHOLECYSTECTOMY OPEN    . ESOPHAGOGASTRODUODENOSCOPY (EGD) WITH PROPOFOL N/A 05/16/2014   Procedure: ESOPHAGOGASTRODUODENOSCOPY (EGD) WITH PROPOFOL with Balloon dilation;  Surgeon: Milus Banister, MD;  Location: [place];  Service: Endoscopy;  Laterality: N/A;  . ESOPHAGOGASTRODUODENOSCOPY (EGD) WITH PROPOFOL N/A 09/02/2015   Procedure: ESOPHAGOGASTRODUODENOSCOPY (EGD) WITH PROPOFOL;  Surgeon: Doran Stabler, MD;  Location: [place];  Service: Gastroenterology;  Laterality: N/A;  . FEMORAL-POPLITEAL BYPASS GRAFT  10/12/2011   Procedure: BYPASS GRAFT FEMORAL-POPLITEAL ARTERY;  Surgeon: Elam Dutch, MD;  Location: [facility] OR;  Service: Vascular;  Laterality: Left;  Left Femoral-Popliteal Bypass Graft using 43mm x 80cm Propaten Graft with intraop arteriogram times one.  . FEMORAL-POPLITEAL BYPASS GRAFT Left 04/17/2014   Procedure: REDO LEFT FEMORAL-POPLITEAL ARTERY BYPASS USING GORE PROPATEN 68mmx80cm GRAFT;  Surgeon: Elam Dutch, MD;  Location: [place];  Service: Vascular;  Laterality: Left;  . FOOT SURGERY Left    "bone spurs"  . INTRAOPERATIVE ARTERIOGRAM  10/12/2011   Procedure: INTRA OPERATIVE ARTERIOGRAM;  Surgeon: Elam Dutch, MD;  Location: [place];  Service: Vascular;  Laterality: Left;  . KNEE ARTHROSCOPY Bilateral    "cartilage"  . Laparascopic cryoablation of  left kidney  08/2006   Dr. Gladis Riffle for renal cell cancer  . LEFT HEART CATHETERIZATION WITH CORONARY ANGIOGRAM N/A 05/11/2011   Procedure: LEFT HEART CATHETERIZATION WITH CORONARY ANGIOGRAM;  Surgeon: Jolaine Artist, MD;  Location: [facility] CATH LAB;  Service: Cardiovascular;  Laterality: N/A;  . LUNG SURGERY Right    lung nodule removed from the right side  . PR VEIN BYPASS GRAFT,AORTO-FEM-POP  05/27/2010  . SHOULDER ARTHROSCOPY Bilateral    'Spurs"  . TUBAL LIGATION      No Known Allergies  Current Outpatient Prescriptions  Medication Sig Dispense Refill  . ACCU-CHEK AVIVA PLUS test strip     . ACCU-CHEK FASTCLIX LANCETS MISC Use 4 per day to check blood sugar dx code E11.9 102 each 3  . Alcohol  Swabs (B-D SINGLE USE SWABS REGULAR) PADS     . cilostazol (PLETAL) 100 MG tablet Take 1 tablet (100 mg total) by mouth 2 (two) times daily before a meal. 180 tablet 2  . clopidogrel (PLAVIX) 75 MG tablet Take 75 mg by mouth daily.     . DULoxetine (CYMBALTA) 60 MG capsule Take 1 capsule (60 mg total) by mouth daily. 90 capsule 1  . empagliflozin (JARDIANCE) 10 MG TABS tablet Take 10 mg by mouth daily. 30 tablet 3  . ferrous sulfate 325 (65 FE) MG tablet Take 325 mg by mouth 2 (two) times daily with a meal.    . gabapentin (NEURONTIN) 100 MG capsule Take 200 mg by mouth at bedtime.     Marland Kitchen HUMALOG KWIKPEN 100 UNIT/ML KiwkPen INJECT 20 UNITS 3 TIMES A DAY 15 mL 3  . HYDROcodone-acetaminophen (NORCO) 10-325 MG tablet Take 1 tablet by mouth every 4 (four) hours as needed for moderate pain or severe pain. 10 tablet 0  . Insulin Pen Needle (BD PEN NEEDLE NANO U/F) 32G X 4 MM MISC Use to inject insulin 100 each 3  . lansoprazole (PREVACID) 30 MG capsule Take 1 capsule (30 mg total) by mouth 2 (two) times daily before a meal. (Patient taking differently: Take 30 mg by mouth daily. ) 60 capsule 11  . levothyroxine (SYNTHROID, LEVOTHROID) 137 MCG tablet TAKE 1 TABLET (137 MCG TOTAL) BY MOUTH DAILY BEFORE  BREAKFAST. 30 tablet 5  . lisinopril (PRINIVIL,ZESTRIL) 5 MG tablet TAKE 1 TABLET (5 MG TOTAL) BY MOUTH DAILY. 30 tablet 3  . metFORMIN (GLUCOPHAGE-XR) 750 MG 24 hr tablet Take 2 tablets (1,500 mg total) by mouth daily. 180 tablet 1  . nortriptyline (PAMELOR) 75 MG capsule Take 75 mg by mouth at bedtime.    . potassium chloride (KLOR-CON 10) 10 MEQ tablet Take 1 tablet (10 mEq total) by mouth daily. 90 tablet 1  . pravastatin (PRAVACHOL) 20 MG tablet Take 20 mg by mouth daily.    Marland Kitchen tolterodine (DETROL LA) 4 MG 24 hr capsule Take 4 mg by mouth daily.     . TOUJEO SOLOSTAR 300 UNIT/ML SOPN INJECT 60 UNITS INTO THE SKIN 2 TIMES DAILY. (Patient taking differently: INJECT 60 UNITS INTO THE SKIN 1 TIME DAILY.) 14 pen 3  . LORazepam (ATIVAN) 1 MG tablet Take 1 tablet (1 mg total) by mouth 3 times/day as needed-between meals & bedtime for anxiety. (Patient not taking: Reported on 03/27/2016) 9 tablet 0   No current facility-administered medications for this visit.     ROS: See HPI for pertinent positives and negatives.   Physical Examination  Vitals:   03/27/16 1439 03/27/16 1443  BP: (!) 177/75 (!) 174/77  Pulse: 69   Resp: 18   Temp: 98.7 F (37.1 C)   TempSrc: Oral   SpO2: 96%   Weight: 172 lb (78 kg)   Height: 5\' 1"  (1.549 m)    Body mass index is 32.5 kg/m.  General: A&O x 3, WDWN, obese female.  Gait: mildlyataxic Eyes: PERRLA.  Pulmonary: Respirations are non labored, CTAB, no rales, wheezes ,or rhonchi. Cardiac: regular rhythm and rate, no detected murmur.   Carotid Bruits Left Right   Negative  positive   Aorta is not palpable.  Radial pulses: right is 2+ palpable, left is faintly palpable, left brachial pulse is 2+ palpable   VASCULAR EXAM: Extremitieswithout ischemic changes  without Gangrene; without open wounds.   LE Pulses  LEFT  RIGHT  FEMORAL  1+ palpable 2+ palpable   POPLITEAL  not  palpable  not palpable  POSTERIOR TIBIAL  1+ palpable  not palpable   DORSALIS PEDIS ANTERIOR TIBIAL  1+ palpable  not palpable    Abdomen: soft, NT, no palpable masses.  Skin: no rashes, no ulcers, see Extremities.  Musculoskeletal: no muscle wasting or atrophy.  Neurologic: A&O X 3; Appropriate Affect; MOTOR FUNCTION: moving all extremities equally, motor strength 5/5 throughout. Speech is slightly slurred. CN 2-12 intact.   ASSESSMENT: April Branch is a 71 y.o. female who is status post left femoropopliteal bypass graft with propaten on 10/12/2011 with subsequent occlusion, left femoral to below-knee popliteal bypass with propaten on 04/17/2014.  She also has a history of mild carotid artery stenosis.  She does not give a history of known TIA events, but pt reports that brain imaging from about 2010 indicates a history of 3 TIA's. She has intermittent vertigo or ataxia, not clear if this is TIA related.  September 2016 carotid duplex suggests minimal bilateral ICA stenoses, no significant change from 05/19/13.  She has not been walking much since she slipped and fell recently, evaluated at [facility] a few days later, contusions found.   She seems to be taking the Pletal to help her claudication, but is vague re this.     Pt states her B12 level has not been checked, she reports stable ataxia.   Pt has agreed to see Dr. Donzetta Matters in the future if she needs vascular surgery.   Her atherosclerotic risk factors include uncontrolled DM until recently brought in control, 54 years history of smoking (quit in 2016), CAD, and COPD.   DATA  ABI's today: Right: 0.83 (0.85, 11-28-15), Waveforms: : PT: monophasic, DP: biphasic; TBI: 0.55 (0.55, 11-28-15). Left: 0.71 (0.74, 11-28-15), monophasic waveforms; TBI: 0.64 (0.66, 11-28-15).  05/23/15 left LE arterial duplex was unable to document flow in the left-fem-popliteal bypass graft. Monophasic flow noted  in the native popliteal and posterior tibia artery. No flow was documented in the anterior tibia artery. The left peroneal demonstrated blunted waveforms with decreased velocities suggestive of occlusive disease distally.  11-28-15 Carotid duplex demonstrates <40% bilateral ICA stenosis, both vertebral arteries are antegrade (normal), both subclavian arteries are triphasic (normal). No significant change compared to duplex exam on 11/22/14.   Carotid Duplex result from 8/5/20013, showed 60-79% right ICA stenosis and 40-59% left ICA stenosis, requested by Dr. Burt Knack, pt does not recall this.    PLAN:   Pletal reordered to help alleviate left calf claudication since pt had not taken when prescribed at her last visit with me, she did not receive this from her pharmacy.  Continue graduated walking program as tolerated with her right knee feeling sore since she fell on it. Daily seated leg exercises discussed and demonstrated.   Based on today's exam and non-invasive vascular lab results, the patient will follow up in 1 year with the following tests: ABI's and carotid duplex.  I advised pt to notify us if she develops non healing wounds or worsening pain in her legs with walking.  I discussed in depth with the patient the nature of atherosclerosis, and emphasized the importance of maximal medical management including strict control of blood pressure, blood glucose, and lipid levels, obtaining regular exercise, and continued cessation of smoking.  The patient is aware that without maximal medical management the underlying atherosclerotic disease process will progress, limiting the benefit of any interventions.  The patient was given information about PAD  including signs, symptoms, treatment, what symptoms should prompt the patient to seek immediate medical care, and risk reduction measures to take.  Clemon Chambers, RN, MSN, FNP-C [facility] Phone:  [phone]  Clinic MD: Bridgett Larsson  03/27/16 2:50 PM

## 2016-03-27 NOTE — Patient Instructions (Signed)

## 2016-04-01 NOTE — Addendum Note (Signed)
Addended by: Lianne Cure A on: 04/01/2016 10:37 AM   Modules accepted: Orders

## 2016-04-16 ENCOUNTER — Other Ambulatory Visit: Payer: Self-pay | Admitting: Endocrinology

## 2016-05-05 ENCOUNTER — Telehealth: Payer: Self-pay | Admitting: Endocrinology

## 2016-05-05 NOTE — Telephone Encounter (Signed)
[facility] need patient last Need office notes and  A1c, [phone]  Ex [phone]

## 2016-05-06 NOTE — Telephone Encounter (Signed)
This has been handled.

## 2016-05-06 NOTE — Telephone Encounter (Signed)
Left a voice mail- need to know where to fax these

## 2016-05-10 ENCOUNTER — Emergency Department (HOSPITAL_COMMUNITY): Payer: Medicare Other

## 2016-05-10 ENCOUNTER — Observation Stay (HOSPITAL_COMMUNITY)
Admission: EM | Admit: 2016-05-10 | Discharge: 2016-05-11 | Disposition: A | Discharge status: Home / Self Care | Payer: Medicare Other | Attending: Internal Medicine | Admitting: Internal Medicine

## 2016-05-10 ENCOUNTER — Encounter (HOSPITAL_COMMUNITY): Payer: Self-pay | Admitting: Emergency Medicine

## 2016-05-10 DIAGNOSIS — E11649 Type 2 diabetes mellitus with hypoglycemia without coma: Secondary | ICD-10-CM | POA: Diagnosis not present

## 2016-05-10 DIAGNOSIS — F329 Major depressive disorder, single episode, unspecified: Secondary | ICD-10-CM | POA: Diagnosis not present

## 2016-05-10 DIAGNOSIS — E1151 Type 2 diabetes mellitus with diabetic peripheral angiopathy without gangrene: Secondary | ICD-10-CM | POA: Insufficient documentation

## 2016-05-10 DIAGNOSIS — N179 Acute kidney failure, unspecified: Secondary | ICD-10-CM | POA: Diagnosis not present

## 2016-05-10 DIAGNOSIS — Z8673 Personal history of transient ischemic attack (TIA), and cerebral infarction without residual deficits: Secondary | ICD-10-CM | POA: Diagnosis not present

## 2016-05-10 DIAGNOSIS — R55 Syncope and collapse: Secondary | ICD-10-CM

## 2016-05-10 DIAGNOSIS — M797 Fibromyalgia: Secondary | ICD-10-CM | POA: Insufficient documentation

## 2016-05-10 DIAGNOSIS — I10 Essential (primary) hypertension: Secondary | ICD-10-CM | POA: Diagnosis not present

## 2016-05-10 DIAGNOSIS — Z794 Long term (current) use of insulin: Secondary | ICD-10-CM | POA: Diagnosis not present

## 2016-05-10 DIAGNOSIS — I25810 Atherosclerosis of coronary artery bypass graft(s) without angina pectoris: Secondary | ICD-10-CM | POA: Diagnosis not present

## 2016-05-10 DIAGNOSIS — E785 Hyperlipidemia, unspecified: Secondary | ICD-10-CM | POA: Diagnosis present

## 2016-05-10 DIAGNOSIS — D649 Anemia, unspecified: Secondary | ICD-10-CM | POA: Diagnosis not present

## 2016-05-10 DIAGNOSIS — Z7902 Long term (current) use of antithrombotics/antiplatelets: Secondary | ICD-10-CM | POA: Insufficient documentation

## 2016-05-10 DIAGNOSIS — F411 Generalized anxiety disorder: Secondary | ICD-10-CM | POA: Diagnosis not present

## 2016-05-10 DIAGNOSIS — K219 Gastro-esophageal reflux disease without esophagitis: Secondary | ICD-10-CM | POA: Diagnosis present

## 2016-05-10 DIAGNOSIS — Z85528 Personal history of other malignant neoplasm of kidney: Secondary | ICD-10-CM | POA: Insufficient documentation

## 2016-05-10 DIAGNOSIS — E039 Hypothyroidism, unspecified: Secondary | ICD-10-CM | POA: Diagnosis not present

## 2016-05-10 DIAGNOSIS — I252 Old myocardial infarction: Secondary | ICD-10-CM | POA: Diagnosis not present

## 2016-05-10 DIAGNOSIS — G894 Chronic pain syndrome: Secondary | ICD-10-CM | POA: Diagnosis not present

## 2016-05-10 DIAGNOSIS — F17210 Nicotine dependence, cigarettes, uncomplicated: Secondary | ICD-10-CM | POA: Diagnosis not present

## 2016-05-10 DIAGNOSIS — I700 Atherosclerosis of aorta: Secondary | ICD-10-CM | POA: Diagnosis not present

## 2016-05-10 DIAGNOSIS — I739 Peripheral vascular disease, unspecified: Secondary | ICD-10-CM | POA: Diagnosis present

## 2016-05-10 DIAGNOSIS — E1142 Type 2 diabetes mellitus with diabetic polyneuropathy: Secondary | ICD-10-CM | POA: Diagnosis not present

## 2016-05-10 DIAGNOSIS — I951 Orthostatic hypotension: Secondary | ICD-10-CM | POA: Diagnosis not present

## 2016-05-10 DIAGNOSIS — Z9181 History of falling: Secondary | ICD-10-CM | POA: Insufficient documentation

## 2016-05-10 DIAGNOSIS — K529 Noninfective gastroenteritis and colitis, unspecified: Secondary | ICD-10-CM | POA: Diagnosis present

## 2016-05-10 DIAGNOSIS — K580 Irritable bowel syndrome with diarrhea: Secondary | ICD-10-CM | POA: Insufficient documentation

## 2016-05-10 DIAGNOSIS — Z8249 Family history of ischemic heart disease and other diseases of the circulatory system: Secondary | ICD-10-CM | POA: Insufficient documentation

## 2016-05-10 DIAGNOSIS — J449 Chronic obstructive pulmonary disease, unspecified: Secondary | ICD-10-CM | POA: Diagnosis present

## 2016-05-10 DIAGNOSIS — I6529 Occlusion and stenosis of unspecified carotid artery: Secondary | ICD-10-CM | POA: Diagnosis present

## 2016-05-10 DIAGNOSIS — I6523 Occlusion and stenosis of bilateral carotid arteries: Secondary | ICD-10-CM | POA: Insufficient documentation

## 2016-05-10 DIAGNOSIS — I2510 Atherosclerotic heart disease of native coronary artery without angina pectoris: Secondary | ICD-10-CM | POA: Diagnosis present

## 2016-05-10 DIAGNOSIS — E119 Type 2 diabetes mellitus without complications: Secondary | ICD-10-CM

## 2016-05-10 LAB — CBC WITH DIFFERENTIAL/PLATELET
BASOS PCT: 1 %
Basophils Absolute: 0.1 10*3/uL (ref 0.0–0.1)
Eosinophils Absolute: 0.1 10*3/uL (ref 0.0–0.7)
Eosinophils Relative: 2 %
HEMATOCRIT: 29.6 % — AB (ref 36.0–46.0)
Hemoglobin: 9.7 g/dL — ABNORMAL LOW (ref 12.0–15.0)
LYMPHS PCT: 29 %
Lymphs Abs: 2.4 10*3/uL (ref 0.7–4.0)
MCH: 27.6 pg (ref 26.0–34.0)
MCHC: 32.8 g/dL (ref 30.0–36.0)
MCV: 84.1 fL (ref 78.0–100.0)
MONO ABS: 0.5 10*3/uL (ref 0.1–1.0)
MONOS PCT: 6 %
NEUTROS ABS: 5.2 10*3/uL (ref 1.7–7.7)
NEUTROS PCT: 62 %
Platelets: 246 10*3/uL (ref 150–400)
RBC: 3.52 MIL/uL — ABNORMAL LOW (ref 3.87–5.11)
RDW: 15.1 % (ref 11.5–15.5)
WBC: 8.2 10*3/uL (ref 4.0–10.5)

## 2016-05-10 LAB — IRON AND TIBC
IRON: 52 ug/dL (ref 28–170)
Saturation Ratios: 22 % (ref 10.4–31.8)
TIBC: 235 ug/dL — AB (ref 250–450)
UIBC: 183 ug/dL

## 2016-05-10 LAB — BASIC METABOLIC PANEL
Anion gap: 8 (ref 5–15)
BUN: 24 mg/dL — ABNORMAL HIGH (ref 6–20)
CALCIUM: 7.8 mg/dL — AB (ref 8.9–10.3)
CHLORIDE: 108 mmol/L (ref 101–111)
CO2: 22 mmol/L (ref 22–32)
CREATININE: 1.62 mg/dL — AB (ref 0.44–1.00)
GFR calc non Af Amer: 32 mL/min — ABNORMAL LOW (ref >60)
GFR, EST AFRICAN AMERICAN: 37 mL/min — AB (ref >60)
GLUCOSE: 108 mg/dL — AB (ref 65–99)
Potassium: 3.4 mmol/L — ABNORMAL LOW (ref 3.5–5.1)
Sodium: 138 mmol/L (ref 135–145)

## 2016-05-10 LAB — URINALYSIS, ROUTINE W REFLEX MICROSCOPIC
BILIRUBIN URINE: NEGATIVE
Glucose, UA: >=500 mg/dL — AB
Ketones, ur: NEGATIVE mg/dL
LEUKOCYTES UA: NEGATIVE
NITRITE: NEGATIVE
Protein, ur: NEGATIVE mg/dL
Specific Gravity, Urine: 1.01 (ref 1.005–1.030)
pH: 5 (ref 5.0–8.0)

## 2016-05-10 LAB — RETICULOCYTES
RBC.: 4.46 MIL/uL (ref 3.87–5.11)
Retic Count, Absolute: 53.5 10*3/uL (ref 19.0–186.0)
Retic Ct Pct: 1.2 % (ref 0.4–3.1)

## 2016-05-10 LAB — GASTROINTESTINAL PANEL BY PCR, STOOL (REPLACES STOOL CULTURE)
ASTROVIRUS: NOT DETECTED
Adenovirus F40/41: NOT DETECTED
Campylobacter species: DETECTED — AB
Cryptosporidium: NOT DETECTED
Cyclospora cayetanensis: NOT DETECTED
ENTAMOEBA HISTOLYTICA: NOT DETECTED
ENTEROAGGREGATIVE E COLI (EAEC): NOT DETECTED
ENTEROTOXIGENIC E COLI (ETEC): NOT DETECTED
Enteropathogenic E coli (EPEC): DETECTED — AB
GIARDIA LAMBLIA: NOT DETECTED
NOROVIRUS GI/GII: NOT DETECTED
Plesimonas shigelloides: NOT DETECTED
ROTAVIRUS A: NOT DETECTED
SHIGA LIKE TOXIN PRODUCING E COLI (STEC): NOT DETECTED
Salmonella species: NOT DETECTED
Sapovirus (I, II, IV, and V): NOT DETECTED
Shigella/Enteroinvasive E coli (EIEC): NOT DETECTED
VIBRIO CHOLERAE: NOT DETECTED
Vibrio species: NOT DETECTED
Yersinia enterocolitica: NOT DETECTED

## 2016-05-10 LAB — GLUCOSE, CAPILLARY
GLUCOSE-CAPILLARY: 69 mg/dL (ref 65–99)
GLUCOSE-CAPILLARY: 89 mg/dL (ref 65–99)
GLUCOSE-CAPILLARY: 162 mg/dL — AB (ref 65–99)
Glucose-Capillary: 157 mg/dL — ABNORMAL HIGH (ref 65–99)

## 2016-05-10 LAB — FERRITIN: FERRITIN: 43 ng/mL (ref 11–307)

## 2016-05-10 LAB — FOLATE: Folate: 17.9 ng/mL (ref >5.9)

## 2016-05-10 LAB — VITAMIN B12: Vitamin B-12: 192 pg/mL (ref 180–914)

## 2016-05-10 LAB — PHOSPHORUS: Phosphorus: 3.9 mg/dL (ref 2.5–4.6)

## 2016-05-10 LAB — MAGNESIUM: Magnesium: 1.1 mg/dL — ABNORMAL LOW (ref 1.7–2.4)

## 2016-05-10 LAB — I-STAT TROPONIN, ED: Troponin i, poc: 0.01 ng/mL (ref 0.00–0.08)

## 2016-05-10 LAB — D-DIMER, QUANTITATIVE (NOT AT ARMC): D DIMER QUANT: 1.26 ug{FEU}/mL — AB (ref 0.00–0.50)

## 2016-05-10 LAB — CBG MONITORING, ED: GLUCOSE-CAPILLARY: 111 mg/dL — AB (ref 65–99)

## 2016-05-10 MED ORDER — CIPROFLOXACIN IN D5W 400 MG/200ML IV SOLN
400.0000 mg | Freq: Two times a day (BID) | INTRAVENOUS | Status: DC
  Administered 2016-05-10 – 2016-05-11 (×3): 400 mg via INTRAVENOUS
  Filled 2016-05-10 (×3): qty 200

## 2016-05-10 MED ORDER — SODIUM CHLORIDE 0.9 % IV SOLN
Freq: Once | INTRAVENOUS | Status: AC
  Administered 2016-05-10: 02:00:00 via INTRAVENOUS

## 2016-05-10 MED ORDER — NORTRIPTYLINE HCL 25 MG PO CAPS
75.0000 mg | ORAL_CAPSULE | Freq: Every day | ORAL | Status: DC
  Administered 2016-05-10: 75 mg via ORAL
  Filled 2016-05-10: qty 3

## 2016-05-10 MED ORDER — INSULIN GLARGINE 100 UNIT/ML ~~LOC~~ SOLN
30.0000 [IU] | Freq: Every day | SUBCUTANEOUS | Status: DC
  Administered 2016-05-10 – 2016-05-11 (×2): 30 [IU] via SUBCUTANEOUS
  Filled 2016-05-10 (×2): qty 0.3

## 2016-05-10 MED ORDER — ENOXAPARIN SODIUM 40 MG/0.4ML ~~LOC~~ SOLN
40.0000 mg | SUBCUTANEOUS | Status: DC
  Administered 2016-05-10 – 2016-05-11 (×2): 40 mg via SUBCUTANEOUS
  Filled 2016-05-10 (×2): qty 0.4

## 2016-05-10 MED ORDER — CILOSTAZOL 100 MG PO TABS
100.0000 mg | ORAL_TABLET | Freq: Two times a day (BID) | ORAL | Status: DC
  Administered 2016-05-10 – 2016-05-11 (×2): 100 mg via ORAL
  Filled 2016-05-10 (×3): qty 1

## 2016-05-10 MED ORDER — IOPAMIDOL (ISOVUE-370) INJECTION 76%
INTRAVENOUS | Status: AC
  Administered 2016-05-10: 100 mL
  Filled 2016-05-10: qty 100

## 2016-05-10 MED ORDER — ACETAMINOPHEN 650 MG RE SUPP: 650.0000 mg | Freq: Four times a day (QID) | RECTAL | Status: DC | PRN

## 2016-05-10 MED ORDER — METRONIDAZOLE IN NACL 5-0.79 MG/ML-% IV SOLN
500.0000 mg | Freq: Three times a day (TID) | INTRAVENOUS | Status: DC
  Administered 2016-05-10 (×3): 500 mg via INTRAVENOUS
  Filled 2016-05-10 (×4): qty 100

## 2016-05-10 MED ORDER — HYPROMELLOSE (GONIOSCOPIC) 2.5 % OP SOLN
1.0000 [drp] | Freq: Four times a day (QID) | OPHTHALMIC | Status: DC | PRN
  Filled 2016-05-10: qty 15

## 2016-05-10 MED ORDER — LORAZEPAM 1 MG PO TABS: 1.0000 mg | ORAL_TABLET | Freq: Two times a day (BID) | ORAL | Status: DC | PRN

## 2016-05-10 MED ORDER — INSULIN ASPART 100 UNIT/ML ~~LOC~~ SOLN
0.0000 [IU] | SUBCUTANEOUS | Status: DC
Start: 2016-05-10 — End: 2016-05-11
  Administered 2016-05-10 (×2): 3 [IU] via SUBCUTANEOUS
  Administered 2016-05-11: 2 [IU] via SUBCUTANEOUS
  Administered 2016-05-11: 5 [IU] via SUBCUTANEOUS

## 2016-05-10 MED ORDER — MAGNESIUM SULFATE 2 GM/50ML IV SOLN
2.0000 g | Freq: Once | INTRAVENOUS | Status: AC
  Administered 2016-05-10: 2 g via INTRAVENOUS
  Filled 2016-05-10: qty 50

## 2016-05-10 MED ORDER — OXYCODONE-ACETAMINOPHEN 5-325 MG PO TABS
2.0000 | ORAL_TABLET | Freq: Once | ORAL | Status: AC
Start: 2016-05-10 — End: 2016-05-10
  Administered 2016-05-10: 2 via ORAL
  Filled 2016-05-10: qty 2

## 2016-05-10 MED ORDER — FESOTERODINE FUMARATE ER 4 MG PO TB24
4.0000 mg | ORAL_TABLET | Freq: Every day | ORAL | Status: DC
  Administered 2016-05-10 – 2016-05-11 (×2): 4 mg via ORAL
  Filled 2016-05-10 (×2): qty 1

## 2016-05-10 MED ORDER — DULOXETINE HCL 60 MG PO CPEP
60.0000 mg | ORAL_CAPSULE | Freq: Every day | ORAL | Status: DC
  Administered 2016-05-10 – 2016-05-11 (×2): 60 mg via ORAL
  Filled 2016-05-10 (×2): qty 1

## 2016-05-10 MED ORDER — ONDANSETRON HCL 4 MG/2ML IJ SOLN: 4.0000 mg | Freq: Four times a day (QID) | INTRAMUSCULAR | Status: DC | PRN

## 2016-05-10 MED ORDER — CLOPIDOGREL BISULFATE 75 MG PO TABS
75.0000 mg | ORAL_TABLET | Freq: Every day | ORAL | Status: DC
  Administered 2016-05-10 – 2016-05-11 (×2): 75 mg via ORAL
  Filled 2016-05-10 (×2): qty 1

## 2016-05-10 MED ORDER — SODIUM CHLORIDE 0.9 % IV SOLN
INTRAVENOUS | Status: DC
  Administered 2016-05-10: 21:00:00 via INTRAVENOUS

## 2016-05-10 MED ORDER — SODIUM CHLORIDE 0.9% FLUSH
3.0000 mL | Freq: Two times a day (BID) | INTRAVENOUS | Status: DC
  Administered 2016-05-10: 3 mL via INTRAVENOUS

## 2016-05-10 MED ORDER — LEVOTHYROXINE SODIUM 25 MCG PO TABS
137.0000 ug | ORAL_TABLET | Freq: Every day | ORAL | Status: DC
  Administered 2016-05-10 – 2016-05-11 (×2): 137 ug via ORAL
  Filled 2016-05-10 (×2): qty 1

## 2016-05-10 MED ORDER — SODIUM CHLORIDE 0.9 % IV BOLUS (SEPSIS)
500.0000 mL | Freq: Once | INTRAVENOUS | Status: AC
  Administered 2016-05-10: 500 mL via INTRAVENOUS

## 2016-05-10 MED ORDER — HYDROCODONE-ACETAMINOPHEN 10-325 MG PO TABS
1.0000 | ORAL_TABLET | ORAL | Status: DC | PRN
  Administered 2016-05-10 – 2016-05-11 (×6): 1 via ORAL
  Filled 2016-05-10 (×6): qty 1

## 2016-05-10 MED ORDER — ONDANSETRON HCL 4 MG PO TABS: 4.0000 mg | ORAL_TABLET | Freq: Four times a day (QID) | ORAL | Status: DC | PRN

## 2016-05-10 MED ORDER — ACETAMINOPHEN 325 MG PO TABS
650.0000 mg | ORAL_TABLET | Freq: Four times a day (QID) | ORAL | Status: DC | PRN
  Filled 2016-05-10: qty 2

## 2016-05-10 MED ORDER — GABAPENTIN 100 MG PO CAPS
200.0000 mg | ORAL_CAPSULE | Freq: Every day | ORAL | Status: DC
  Administered 2016-05-10: 200 mg via ORAL
  Filled 2016-05-10: qty 2

## 2016-05-10 MED ORDER — ACETAMINOPHEN 325 MG PO TABS
650.0000 mg | ORAL_TABLET | Freq: Once | ORAL | Status: AC
  Administered 2016-05-10: 650 mg via ORAL
  Filled 2016-05-10: qty 2

## 2016-05-10 MED ORDER — DIPHENHYDRAMINE HCL 25 MG PO CAPS
25.0000 mg | ORAL_CAPSULE | Freq: Four times a day (QID) | ORAL | Status: DC | PRN
  Administered 2016-05-10: 25 mg via ORAL
  Filled 2016-05-10: qty 1

## 2016-05-10 NOTE — ED Notes (Signed)
Patient transported to CT 

## 2016-05-10 NOTE — ED Notes (Signed)
Report given to [facility].

## 2016-05-10 NOTE — ED Notes (Signed)
Attempted report 

## 2016-05-10 NOTE — ED Triage Notes (Signed)
Per EMS pt was sitting in her chair tonight and passed out. No one was there to witness the event.  Pt initial pressure was 90 / 50. Pt has received 800 cc NS.  A&Ox4

## 2016-05-10 NOTE — ED Provider Notes (Signed)
[facility] DEPT Provider Note   CSN: [id number] Arrival date & time: 05/10/16  0014  By signing my name below, I, Dolores Hoose, attest that this documentation has been prepared under the direction and in the presence of Varney Biles, MD . Electronically Signed: Dolores Hoose, Scribe. 05/10/2016. 12:34 AM.  History   Chief Complaint Chief Complaint  Patient presents with  . Loss of Consciousness   The history is provided by the patient. No language interpreter was used.   HPI Comments:  April Branch is a 71 y.o. female with pmhx of TIA, blood clots, HTN, HLD, CAD, Ca, and DM brought in by ambulance, who presents to the Emergency Department after an unwitnessed syncopal episode at home. Pt states that she was bending over and when she stood up, she felt dizzy and began to lose consciousness. She notes that she managed to fall on a nearby bench during her syncopal episode and did not hit her head. Pt reports associated SOB and dizziness at time of episode, diarrhea, bilateral upper extremity paraesthesia, LE swelling and back pain. She is currently compliant with her blood-thinning medication, Plavix. Pt denies any CP, headache, numbness, tingling, palpitations, hematuria, bloody stools or abdominal pain. She is a current smoker.   Pt has received a total of 1500ccs of fluid in the ED and by EMS.   Past Medical History:  Diagnosis Date  . Anemia   . Anginal pain ([place])    1 MONTH AGO   . Anxiety   . Arthritis   . CAD (coronary artery disease)   . CAD (coronary artery disease) of bypass graft   . 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 Colon polyps 09/11/2010   Tubular adenoma  . COPD 12/14/2008   PATIENT DENIES    . Depression   . Diabetes mellitus age 64   insulin dependent  . Diabetic coma ([place]) Feb. 2014  . Fall at home Jan. 2, 2016  Jan. 13, 2016   Left knee  . Fibromyalgia   . GERD 07/20/2006  . GERD (gastroesophageal  reflux disease)   . Headache(784.0)   . History of transient ischemic attack (TIA)   . HPV (human papilloma virus) infection   . Hyperlipidemia   . Hypertension   . Hypothyroidism   . Insulin dependent type 2 diabetes mellitus, controlled ([place])   . Lumbar disc disease   . Myocardial infarction    AGE 37    . Neuromuscular disorder ([place])    PERIPHERAL NEUROPATHY  . Peripheral vascular disease ([place])   . Personal history of malignant neoplasm of kidney(V10.52) 12/14/2008   Laparoscopic biopsy and cryoablation 7/08 Dr. Vernie Shanks   . Stroke [facility])    3 MINI STROKES  RT SIDED WEAKNESS  . Vertigo     Patient Active Problem List   Diagnosis Date Noted  . Drowsiness 09/03/2015  . Food impaction of esophagus 09/02/2015  . Hypotension 06/24/2015  . Diabetic foot ulcer ([place]) 06/24/2015  . Atheroscler of bypass graft of left leg with intermittent claudication ([place]) 04/17/2014  . PAD (peripheral artery disease) ([place]) 03/16/2014  . Occlusion and stenosis of carotid artery without mention of cerebral infarction 05/19/2013  . Chest pain 09/21/2012  . Nausea vomiting and diarrhea 09/21/2012  . Acute kidney injury (nontraumatic) ([place]) 09/21/2012  . Chronic pain syndrome 03/13/2012  . Acute kidney injury ([place]) 03/13/2012  . Encephalopathy, toxic 03/12/2012  . Pain in limb 10/22/2011  .  Atherosclerosis of native arteries of the extremities with intermittent claudication 10/08/2011  . Peripheral vascular disease, unspecified 07/30/2011  . CAD (coronary artery disease) 05/10/2011  . Hyperlipidemia   . Unstable angina ([place]) 05/09/2011  . Peripheral vascular disease ([place]) 12/19/2008  . CERVICAL CANCER 12/14/2008  . Personal history of malignant neoplasm of kidney(V10.52) 12/14/2008  . MI 12/14/2008  . COPD (chronic obstructive pulmonary disease) ([place]) 12/14/2008  . IBS 12/14/2008  . Essential hypertension   . Personal history of colonic polyps 08/13/2008  . DISORDER, TOBACCO USE 08/04/2006  .  Hypothyroidism   . Insulin dependent type 2 diabetes mellitus, controlled ([place])   . Lumbar disc disease   . ANXIETY STATE NOS 07/20/2006  . GERD 07/20/2006  . HX, PERSONAL, VENOUS THROMBOSIS/EMBOLISM 07/20/2006    Past Surgical History:  Procedure Laterality Date  . ABDOMINAL AORTAGRAM N/A 08/21/2011   Procedure: ABDOMINAL Maxcine Ham;  Surgeon: Elam Dutch, MD;  Location: [facility] CATH LAB;  Service: Cardiovascular;  Laterality: N/A;  . ABDOMINAL AORTAGRAM N/A 03/16/2014   Procedure: ABDOMINAL Maxcine Ham;  Surgeon: Elam Dutch, MD;  Location: [facility] CATH LAB;  Service: Cardiovascular;  Laterality: N/A;  . ABDOMINAL HYSTERECTOMY    . APPENDECTOMY    . BLADDER SUSPENSION    . CARDIAC CATHETERIZATION    . CHOLECYSTECTOMY OPEN    . ESOPHAGOGASTRODUODENOSCOPY (EGD) WITH PROPOFOL N/A 05/16/2014   Procedure: ESOPHAGOGASTRODUODENOSCOPY (EGD) WITH PROPOFOL with Balloon dilation;  Surgeon: Milus Banister, MD;  Location: [facility];  Service: Endoscopy;  Laterality: N/A;  . ESOPHAGOGASTRODUODENOSCOPY (EGD) WITH PROPOFOL N/A 09/02/2015   Procedure: ESOPHAGOGASTRODUODENOSCOPY (EGD) WITH PROPOFOL;  Surgeon: Doran Stabler, MD;  Location: [facility];  Service: Gastroenterology;  Laterality: N/A;  . FEMORAL-POPLITEAL BYPASS GRAFT  10/12/2011   Procedure: BYPASS GRAFT FEMORAL-POPLITEAL ARTERY;  Surgeon: Elam Dutch, MD;  Location[facility] OR;  Service: Vascular;  Laterality: Left;  Left Femoral-Popliteal Bypass Graft using 26mm x 80cm Propaten Graft with intraop arteriogram times one.  . FEMORAL-POPLITEAL BYPASS GRAFT Left 04/17/2014   Procedure: REDO LEFT FEMORAL-POPLITEAL ARTERY BYPASS USING GORE PROPATEN 38mmx80cm GRAFT;  Surgeon: Elam Dutch, MD;  Location: [facility];  Service: Vascular;  Laterality: Left;  . FOOT SURGERY Left    "bone spurs"  . INTRAOPERATIVE ARTERIOGRAM  10/12/2011   Procedure: INTRA OPERATIVE ARTERIOGRAM;  Surgeon: Elam Dutch, MD;  Location: [facility];  Service: Vascular;   Laterality: Left;  . KNEE ARTHROSCOPY Bilateral    "cartilage"  . Laparascopic cryoablation of left kidney  08/2006   Dr. Gladis Riffle for renal cell cancer  . LEFT HEART CATHETERIZATION WITH CORONARY ANGIOGRAM N/A 05/11/2011   Procedure: LEFT HEART CATHETERIZATION WITH CORONARY ANGIOGRAM;  Surgeon: Jolaine Artist, MD;  Location: [facility] CATH LAB;  Service: Cardiovascular;  Laterality: N/A;  . LUNG SURGERY Right    lung nodule removed from the right side  . PR VEIN BYPASS GRAFT,AORTO-FEM-POP  05/27/2010  . SHOULDER ARTHROSCOPY Bilateral    'Spurs"  . TUBAL LIGATION      OB History    No data available       Home Medications    Prior to Admission medications   Medication Sig Start Date End Date Taking? Authorizing Provider  ACCU-CHEK FASTCLIX LANCETS MISC Use 4 per day to check blood sugar dx code E11.9 11/29/14  Yes Elayne Snare, MD  atenolol (TENORMIN) 25 MG tablet Take 50 mg by mouth daily.   Yes Historical Provider, MD  cilostazol (PLETAL) 100 MG tablet Take 1 tablet (  100 mg total) by mouth 2 (two) times daily before a meal. 03/17/16  Yes Elam Dutch, MD  clopidogrel (PLAVIX) 75 MG tablet Take 75 mg by mouth daily.  04/02/14  Yes Historical Provider, MD  DULoxetine (CYMBALTA) 60 MG capsule Take 1 capsule (60 mg total) by mouth daily. 03/19/16  Yes Elayne Snare, MD  empagliflozin (JARDIANCE) 10 MG TABS tablet Take 10 mg by mouth daily. 02/25/16  Yes Elayne Snare, MD  ferrous sulfate 325 (65 FE) MG tablet Take 325 mg by mouth 2 (two) times daily with a meal.   Yes Historical Provider, MD  gabapentin (NEURONTIN) 100 MG capsule Take 200 mg by mouth at bedtime.  01/07/15  Yes Historical Provider, MD  HUMALOG KWIKPEN 100 UNIT/ML KiwkPen INJECT 20 UNITS 3 TIMES A DAY Patient taking differently: INJECT 10 UNITS 3 TIMES A DAY AS NEEDED FOR BLOOD SUGAR 04/17/16  Yes Elayne Snare, MD  HYDROcodone-acetaminophen (NORCO) 10-325 MG tablet Take 1 tablet by mouth every 4 (four) hours as needed for moderate pain or  severe pain. 06/28/15  Yes Elwin Mocha, MD  hydroxypropyl methylcellulose / hypromellose (ISOPTO TEARS / GONIOVISC) 2.5 % ophthalmic solution Place 1 drop into both eyes 4 (four) times daily as needed for dry eyes.   Yes Historical Provider, MD  Insulin Pen Needle (BD PEN NEEDLE NANO U/F) 32G X 4 MM MISC Use to inject insulin 10/30/14  Yes Elayne Snare, MD  lansoprazole (PREVACID) 30 MG capsule Take 1 capsule (30 mg total) by mouth 2 (two) times daily before a meal. Patient taking differently: Take 30 mg by mouth daily.  05/16/14  Yes Milus Banister, MD  levothyroxine (SYNTHROID, LEVOTHROID) 137 MCG tablet TAKE 1 TABLET (137 MCG TOTAL) BY MOUTH DAILY BEFORE BREAKFAST. 02/04/16  Yes Elayne Snare, MD  lisinopril (PRINIVIL,ZESTRIL) 5 MG tablet TAKE 1 TABLET (5 MG TOTAL) BY MOUTH DAILY. 04/22/15  Yes Elayne Snare, MD  LORazepam (ATIVAN) 1 MG tablet Take 1 tablet (1 mg total) by mouth 3 times/day as needed-between meals & bedtime for anxiety. 06/28/15  Yes Elwin Mocha, MD  metFORMIN (GLUCOPHAGE-XR) 750 MG 24 hr tablet Take 2 tablets (1,500 mg total) by mouth daily. 12/20/15  Yes Elayne Snare, MD  nortriptyline (PAMELOR) 75 MG capsule Take 75 mg by mouth at bedtime. 01/24/14  Yes Historical Provider, MD  potassium chloride (KLOR-CON 10) 10 MEQ tablet Take 1 tablet (10 mEq total) by mouth daily. 03/19/16  Yes Elayne Snare, MD  pravastatin (PRAVACHOL) 20 MG tablet Take 20 mg by mouth daily.   Yes Historical Provider, MD  tolterodine (DETROL LA) 4 MG 24 hr capsule Take 4 mg by mouth daily.    Yes Historical Provider, MD  TOUJEO SOLOSTAR 300 UNIT/ML SOPN INJECT 60 UNITS INTO THE SKIN 2 TIMES DAILY. Patient taking differently: INJECT 60 UNITS INTO THE SKIN 1 TIME DAILY. 08/02/15  Yes Elayne Snare, MD    Family History Family History  Problem Relation Age of Onset  . Heart disease Mother   . Lung cancer Mother   . Cancer Mother     Lung  . Heart disease Father   . Lung cancer Father   . Cancer Father     Lung  .  Heart disease Sister     CABG- Open Heart    Social History Social History  Substance Use Topics  . Smoking status: Former Smoker    Packs/day: 0.25    Years: 50.00    Types: Cigarettes  Quit date: 06/24/2014  . Smokeless tobacco: Never Used  . Alcohol use No     Allergies   Patient has no known allergies.   Review of Systems Review of Systems  Cardiovascular: Positive for syncope.     Physical Exam Updated Vital Signs BP 139/62   Pulse 73   Temp 97.4 F (36.3 C) (Oral)   Resp 17   Ht 5\' 1"  (1.549 m)   Wt 170 lb (77.1 kg)   SpO2 93%   BMI 32.12 kg/m   Physical Exam  Constitutional: She is oriented to person, place, and time. She appears well-developed and well-nourished. No distress.  HENT:  Head: Normocephalic and atraumatic.  Eyes: Conjunctivae are normal.  Neck: Carotid bruit is not present.  Cardiovascular: Normal rate and regular rhythm.   No murmur heard. 2+ right radial pulse. 1+ left radial pulse.   Pulmonary/Chest: Effort normal.  Anterior lungs clear to auscultation  Abdominal: Soft. She exhibits no distension and no mass.  Musculoskeletal:  RLE unilateral swelling. No pitting edema.   Neurological: She is alert and oriented to person, place, and time.  CN II-XII intact. Cerebellar exam reveals no dysmetria.  Skin: Skin is warm and dry.  Psychiatric: She has a normal mood and affect.  Nursing note and vitals reviewed.    ED Treatments / Results  DIAGNOSTIC STUDIES:  Oxygen Saturation is 90% on RA, low by my interpretation.    COORDINATION OF CARE:  12:49 AM Discussed treatment plan with pt at bedside which includes blood work and imaging and pt agreed to plan.  Labs (all labs ordered are listed, but only abnormal results are displayed) Labs Reviewed  CBC WITH DIFFERENTIAL/PLATELET - Abnormal; Notable for the following:       Result Value   RBC 3.52 (*)    Hemoglobin 9.7 (*)    HCT 29.6 (*)    All other components within normal  limits  BASIC METABOLIC PANEL - Abnormal; Notable for the following:    Potassium 3.4 (*)    Glucose, Bld 108 (*)    BUN 24 (*)    Creatinine, Ser 1.62 (*)    Calcium 7.8 (*)    GFR calc non Af Amer 32 (*)    GFR calc Af Amer 37 (*)    All other components within normal limits  URINALYSIS, ROUTINE W REFLEX MICROSCOPIC - Abnormal; Notable for the following:    Glucose, UA >=500 (*)    Hgb urine dipstick SMALL (*)    Bacteria, UA RARE (*)    Squamous Epithelial / LPF 0-5 (*)    All other components within normal limits  D-DIMER, QUANTITATIVE (NOT AT [facility]) - Abnormal; Notable for the following:    D-Dimer, Quant 1.26 (*)    All other components within normal limits  CBG MONITORING, ED - Abnormal; Notable for the following:    Glucose-Capillary 111 (*)    All other components within normal limits  POCT CBG (FASTING - GLUCOSE)-MANUAL ENTRY  I-STAT TROPOININ, ED    EKG  EKG Interpretation  Date/Time:  Sunday May 10 2016 00:32:13 EDT Ventricular Rate:  63 PR Interval:    QRS Duration: 108 QT Interval:  440 QTC Calculation: 451 R Axis:   61 Text Interpretation:  Sinus rhythm Borderline T abnormalities, anterior leads No acute changes Nonspecific ST and T wave abnormality generalized T wave flattening is new Confirmed by Kathrynn Humble, MD, Thelma Comp ([id number]) on 05/10/2016 12:36:07 AM       Radiology Dg  Chest Port 1 View  Result Date: 05/10/2016 CLINICAL DATA:  Cough and shortness of breath EXAM: PORTABLE CHEST 1 VIEW COMPARISON:  Chest radiograph 09/02/2015 FINDINGS: The heart size and mediastinal contours are within normal limits. Both lungs are clear. The visualized skeletal structures are unremarkable. IMPRESSION: No active disease. Electronically Signed   By: Ulyses Jarred M.D.   On: 05/10/2016 02:59   Ct Angio Chest/abd/pel For Dissection W And/or Wo Contrast  Result Date: 05/10/2016 CLINICAL DATA:  Syncope.  Claudication.  Smoker. EXAM: CT ANGIOGRAPHY CHEST, ABDOMEN AND PELVIS  TECHNIQUE: Multidetector CT imaging through the chest, abdomen and pelvis was performed using the standard protocol during bolus administration of intravenous contrast. Multiplanar reconstructed images and MIPs were obtained and reviewed to evaluate the vascular anatomy. CONTRAST:  75 mL Isovue 370 COMPARISON:  None. FINDINGS: CTA CHEST FINDINGS Cardiovascular: Noncontrast CT images of the chest demonstrate normal heart size. Calcification of the aorta and coronary arteries. Small gas collection in the main portal vein likely resulting from intravenous injection. No evidence of intramural hematoma. Images obtained during arterial phase after contrast injection demonstrate atherosclerotic changes throughout the thoracic aorta, mostly noncalcific. Can't exclude a small ulcerating plaque in the distal aortic arch. No evidence of aortic dissection or aneurysm. Great vessel origins are patent. Normal heart size. No pericardial effusion. Mediastinum/Nodes: No enlarged mediastinal, hilar, or axillary lymph nodes. Thyroid gland, trachea, and esophagus demonstrate no significant findings. Lungs/Pleura: Evaluation of the lungs is limited due to motion artifact. There are linear opacities in both lungs but mostly on the right, likely representing linear areas of atelectasis or fibrosis. There are scattered pulmonary nodules bilaterally, largest measuring about 5 mm in diameter. No focal consolidation or airspace disease. Airways are patent. No pleural effusions. No pneumothorax. Musculoskeletal: Degenerative changes in the spine. No destructive bone lesions. Review of the MIP images confirms the above findings. CTA ABDOMEN AND PELVIS FINDINGS VASCULAR Aorta: Calcific and noncalcific atherosclerotic changes. No aneurysm or dissection. Celiac: Calcification at the origin without significant stenosis. Stenotic nonocclusive atherosclerotic changes at the origin of the splenic artery. No aneurysms. SMA: Calcific and noncalcific  atherosclerotic changes of the origin causing some proximal stenosis. Vessels remain patent. Renals: Single renal arteries bilaterally with calcification at the origins. No occlusion. Nephrograms are patent. IMA: Inferior mesenteric artery is patent. Inflow: Calcific and noncalcific atherosclerotic changes. Mild stenosis of the right common iliac artery. There appears to be a bypass graft arising from the left common femoral artery. The visualized portion of the graft is thrombosed without flow demonstrated. Visualized proximal superficial femoral arteries demonstrate significant vascular disease bilaterally. Veins: No obvious venous abnormality within the limitations of this arterial phase study. Review of the MIP images confirms the above findings. NON-VASCULAR Hepatobiliary: No focal liver abnormality is seen. Status post cholecystectomy. No biliary dilatation. Pancreas: Unremarkable. No pancreatic ductal dilatation or surrounding inflammatory changes. Spleen: Normal in size without focal abnormality. Adrenals/Urinary Tract: 11 mm diameter left adrenal gland nodule. Low density numbers are demonstrated on the noncontrast images consistent with an adenoma. Kidneys are normal, without renal calculi, focal lesion, or hydronephrosis. Bladder is unremarkable. Stomach/Bowel: Stomach, small bowel, and colon are not abnormally distended. Scattered stool throughout the colon. The colonic wall appears mildly thickened as does the terminal ileum. This could represent enterocolitis or inflammatory bowel disease. Clinical correlation recommended. Lymphatic: No significant lymphadenopathy. Reproductive: Status post hysterectomy. No adnexal masses. Other: No abdominal wall hernia or abnormality. No abdominopelvic ascites. Musculoskeletal: Degenerative changes in the spine. No destructive  bone lesions. Review of the MIP images confirms the above findings. IMPRESSION: No evidence of aneurysm or dissection involving the thoracic  or abdominal aorta. Severe diffuse central and peripheral calcific and noncalcific atherosclerotic changes. Proximal stenosis may be present at the splenic artery, superior mesenteric artery, and bilateral renal arteries. Visualized proximal native superficial femoral arteries demonstrates severe disease with minimal if any flow shown. There appears to be a left femoral bypass graft which is occluded. Mild diffuse thickening of the colon wall and terminal ileum could indicate enterocolitis of nonspecific etiology. Electronically Signed   By: Lucienne Capers M.D.   On: 05/10/2016 06:37    Procedures Procedures (including critical care time)  Medications Ordered in ED Medications  oxyCODONE-acetaminophen (PERCOCET/ROXICET) 5-325 MG per tablet 2 tablet (2 tablets Oral Given 05/10/16 0127)  0.9 %  sodium chloride infusion ( Intravenous New Bag/Given 05/10/16 0147)  sodium chloride 0.9 % bolus 500 mL (500 mLs Intravenous New Bag/Given 05/10/16 0507)  acetaminophen (TYLENOL) tablet 650 mg (650 mg Oral Given 05/10/16 0506)  iopamidol (ISOVUE-370) 76 % injection (100 mLs  Contrast Given 05/10/16 0538)     Initial Impression / Assessment and Plan / ED Course  I have reviewed the triage vital signs and the nursing notes.  Pertinent labs & imaging results that were available during my care of the patient were reviewed by me and considered in my medical decision making (see chart for details).     DDx includes: Orthostatic hypotension Stroke Vertebral artery dissection/stenosis Dysrhythmia PE Vasovagal/neurocardiogenic syncope Aortic stenosis Valvular disorder/Cardiomyopathy Anemia Dissection  Pt comes in with cc of syncope. Per SF syncope score - couple of concerning features - pt had DIB prior to syncope and she also has some new T wave flattening per EKG. Additionally, pt also has relatively low BP. Pt has no symptoms concerning for infection.  Symptoms also have some orthostasis feature to  it. We will check orthostatics.  Finally - pt is having some claudication and vague chest and back pain. CT dissection has been ordered. We will also r/o large PE with the study.  Likely admit.   Final Clinical Impressions(s) / ED Diagnoses   Final diagnoses:  Syncope and collapse  Orthostatic syncope  PAD (peripheral artery disease) (HCC)    New Prescriptions New Prescriptions   No medications on file   I personally performed the services described in this documentation, which was scribed in my presence. The recorded information has been reviewed and is accurate.    Varney Biles, MD 05/10/16 0700

## 2016-05-10 NOTE — ED Notes (Signed)
Pt assisted to the bedside commode and back to the bed.  Stool sample collected.  Stool was very soft.

## 2016-05-10 NOTE — ED Notes (Signed)
Pt has received 1500cc NS total today.

## 2016-05-10 NOTE — Progress Notes (Signed)
Hypoglycemic Event  CBG: 69  Treatment: 15 GM carbohydrate snack  Symptoms: None  Follow-up CBG: Time:1746 CBG Result:89  Possible Reasons for Event: Inadequate meal intake      April Branch

## 2016-05-10 NOTE — Progress Notes (Signed)
Magnesium was 1.1. Magnesium bolus 2 g IV 1 dose. Repeat magnesium level in a.m.  Erin Hearing, ANP

## 2016-05-10 NOTE — H&P (Signed)
History and Physical    April Branch TMH:[id number] DOB: Jun 26, 1945 DOA: 05/10/2016   PCP: [name], MD /UNASSIGNED  Patient coming from/Resides with: Private residence/lives with son  Admission status: Observation/telemetry -it may be medically necessary to stay a minimum 2 midnights to rule out impending and/or unexpected changes in physiologic status that may differ from initial evaluation performed in the ER and/or at time of admission therefore consider reevaluation of admission status in 24 hours.   Chief Complaint: Unwitnessed syncope  HPI: April Branch is a 71 y.o. female with medical history significant for peripheral vascular disease, diabetes on multiple agents including insulin, hypertension, CAD, bilateral carotid stenosis with history of prior TIA, dyslipidemia, hypothyroidism, GERD, chronic pain secondary to fibromyalgia, COPD and ongoing tobacco abuse. Patient was brought to the hospital by EMS after being found by a friend passed out sitting upright in a chair. Upon EMS arrival the patient's initial blood pressure was 90/50 and she was given 800 mL fluid challenge. She was alert and oriented upon their evaluation. There were no focal neurological deficits. In the ER patient was found to be positive for orthostatic hypotension and was given additional fluid boluses. D-dimer was elevated but CTA of chest abdomen and pelvis showed no evidence of either aortic dissection or PE. There was an incidental finding of pancolitis. Patient has subsequently reported several days of nonbloody loose stools associated with fevers and chills but no abdominal pain. Not had any nausea or vomiting or upper respiratory infection symptoms. When I questioned her she denied any dizziness or shortness of breath although she did report these symptoms to the emergency room physician as well as a sensation of bilateral upper extremity paresthesias and back pain.  ED Course:  Vital Signs: BP  102/83   Pulse 74   Temp 97.4 F (36.3 C) (Oral)   Resp 17   Ht 5\' 1"  (1.549 m)   Wt 77.1 kg (170 lb)   SpO2 97%   BMI 32.12 kg/m  PCXR: No active disease CTA chest/abdomen/pelvis: No PE or aortic section, occluded left femoral bypass graft, mild diffuse thickening of the colon and terminal ileum that could indicate enterocolitis of a nonspecific etiology Lab data: Sodium 138, potassium 3.4, chloride 108, CO2 22, glucose 108, BUN 24, creatinine 1.62, calcium 7.8, anion gap 8, poc troponin 0.01, white count 8200 with normal differential, hemoglobin 9.7, MCV 84.1, platelets 246,000, d-dimer 1.26, urinalysis unremarkable except for greater than 500 glucose, small hemoglobin Medications and treatments: Percocet 2 tablets, normal saline bolus 500 mL, Tylenol 650 mg 1  Review of Systems:  In addition to the HPI above,  No myalgias or other constitutional symptoms No Headache, changes with Vision or hearing, new weakness, tingling, numbness in any extremity, dizziness, dysarthria or word finding difficulty, gait disturbance or imbalance, tremors or seizure activity No problems swallowing food or Liquids, indigestion/reflux, choking or coughing while eating, abdominal pain with or after eating No Chest pain, Cough, ?? Shortness of Breath, no palpitations, orthopnea or DOE No Abdominal pain, N/V, melena,hematochezia, dark tarry stools, constipation No dysuria, malodorous urine, hematuria or flank pain No new skin rashes, lesions, masses or bruises, No new joint pains, aches, swelling or redness No recent unintentional weight gain or loss No polyuria, polydypsia or polyphagia   Past Medical History:  Diagnosis Date  . Anemia   . Anginal pain ([place])    1 MONTH AGO   . Anxiety   . Arthritis   . CAD (coronary artery disease)   .  CAD (coronary artery disease) of bypass graft   . 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 Colon polyps 09/11/2010   Tubular  adenoma  . COPD 12/14/2008   PATIENT DENIES    . Depression   . Diabetes mellitus age 17   insulin dependent  . Diabetic coma (Home) Feb. 2014  . Fall at home Jan. 2, 2016  Jan. 13, 2016   Left knee  . Fibromyalgia   . GERD 07/20/2006  . GERD (gastroesophageal reflux disease)   . Headache(784.0)   . History of transient ischemic attack (TIA)   . HPV (human papilloma virus) infection   . Hyperlipidemia   . Hypertension   . Hypothyroidism   . Insulin dependent type 2 diabetes mellitus, controlled ([place])   . Lumbar disc disease   . Myocardial infarction    AGE 54    . Neuromuscular disorder ([place])    PERIPHERAL NEUROPATHY  . Peripheral vascular disease ([place])   . Personal history of malignant neoplasm of kidney(V10.52) 12/14/2008   Laparoscopic biopsy and cryoablation 7/08 Dr. Vernie Shanks   . Stroke [facility])    3 MINI STROKES  RT SIDED WEAKNESS  . Vertigo     Past Surgical History:  Procedure Laterality Date  . ABDOMINAL AORTAGRAM N/A 08/21/2011   Procedure: ABDOMINAL Maxcine Ham;  Surgeon: Elam Dutch, MD;  Location: [facility] CATH LAB;  Service: Cardiovascular;  Laterality: N/A;  . ABDOMINAL AORTAGRAM N/A 03/16/2014   Procedure: ABDOMINAL Maxcine Ham;  Surgeon: Elam Dutch, MD;  Location: [facility] CATH LAB;  Service: Cardiovascular;  Laterality: N/A;  . ABDOMINAL HYSTERECTOMY    . APPENDECTOMY    . BLADDER SUSPENSION    . CARDIAC CATHETERIZATION    . CHOLECYSTECTOMY OPEN    . ESOPHAGOGASTRODUODENOSCOPY (EGD) WITH PROPOFOL N/A 05/16/2014   Procedure: ESOPHAGOGASTRODUODENOSCOPY (EGD) WITH PROPOFOL with Balloon dilation;  Surgeon: Milus Banister, MD;  Location: [place];  Service: Endoscopy;  Laterality: N/A;  . ESOPHAGOGASTRODUODENOSCOPY (EGD) WITH PROPOFOL N/A 09/02/2015   Procedure: ESOPHAGOGASTRODUODENOSCOPY (EGD) WITH PROPOFOL;  Surgeon: Doran Stabler, MD;  Location: [place];  Service: Gastroenterology;  Laterality: N/A;  . FEMORAL-POPLITEAL BYPASS GRAFT  10/12/2011    Procedure: BYPASS GRAFT FEMORAL-POPLITEAL ARTERY;  Surgeon: Elam Dutch, MD;  Location: [facility] OR;  Service: Vascular;  Laterality: Left;  Left Femoral-Popliteal Bypass Graft using 41mm x 80cm Propaten Graft with intraop arteriogram times one.  . FEMORAL-POPLITEAL BYPASS GRAFT Left 04/17/2014   Procedure: REDO LEFT FEMORAL-POPLITEAL ARTERY BYPASS USING GORE PROPATEN 9mmx80cm GRAFT;  Surgeon: Elam Dutch, MD;  Location: [place];  Service: Vascular;  Laterality: Left;  . FOOT SURGERY Left    "bone spurs"  . INTRAOPERATIVE ARTERIOGRAM  10/12/2011   Procedure: INTRA OPERATIVE ARTERIOGRAM;  Surgeon: Elam Dutch, MD;  Location: [place];  Service: Vascular;  Laterality: Left;  . KNEE ARTHROSCOPY Bilateral    "cartilage"  . Laparascopic cryoablation of left kidney  08/2006   Dr. Gladis Riffle for renal cell cancer  . LEFT HEART CATHETERIZATION WITH CORONARY ANGIOGRAM N/A 05/11/2011   Procedure: LEFT HEART CATHETERIZATION WITH CORONARY ANGIOGRAM;  Surgeon: Jolaine Artist, MD;  Location: [facility] CATH LAB;  Service: Cardiovascular;  Laterality: N/A;  . LUNG SURGERY Right    lung nodule removed from the right side  . PR VEIN BYPASS GRAFT,AORTO-FEM-POP  05/27/2010  . SHOULDER ARTHROSCOPY Bilateral    'Spurs"  . TUBAL LIGATION      Social History  Social History  . Marital status: Widowed    Spouse name: N/A  . Number of children: 2  . Years of education: N/A   Occupational History  .  Not Employed   Social History Main Topics  . Smoking status: Former Smoker    Packs/day: 0.25    Years: 50.00    Types: Cigarettes    Quit date: 06/24/2014  . Smokeless tobacco: Never Used  . Alcohol use No  . Drug use: No  . Sexual activity: No   Other Topics Concern  . Not on file   Social History Narrative   Widow. Two sons. Husband died of lung cancer.               Mobility: Without assistive devices Work history: Not obtained   No Known Allergies  Family History  Problem Relation Age of  Onset  . Heart disease Mother   . Lung cancer Mother   . Cancer Mother     Lung  . Heart disease Father   . Lung cancer Father   . Cancer Father     Lung  . Heart disease Sister     CABG- Open Heart     Prior to Admission medications   Medication Sig Start Date End Date Taking? Authorizing Provider  ACCU-CHEK FASTCLIX LANCETS MISC Use 4 per day to check blood sugar dx code E11.9 11/29/14  Yes Elayne Snare, MD  atenolol (TENORMIN) 25 MG tablet Take 50 mg by mouth daily.   Yes Historical Provider, MD  cilostazol (PLETAL) 100 MG tablet Take 1 tablet (100 mg total) by mouth 2 (two) times daily before a meal. 03/17/16  Yes Elam Dutch, MD  clopidogrel (PLAVIX) 75 MG tablet Take 75 mg by mouth daily.  04/02/14  Yes Historical Provider, MD  DULoxetine (CYMBALTA) 60 MG capsule Take 1 capsule (60 mg total) by mouth daily. 03/19/16  Yes Elayne Snare, MD  empagliflozin (JARDIANCE) 10 MG TABS tablet Take 10 mg by mouth daily. 02/25/16  Yes Elayne Snare, MD  ferrous sulfate 325 (65 FE) MG tablet Take 325 mg by mouth 2 (two) times daily with a meal.   Yes Historical Provider, MD  gabapentin (NEURONTIN) 100 MG capsule Take 200 mg by mouth at bedtime.  01/07/15  Yes Historical Provider, MD  HUMALOG KWIKPEN 100 UNIT/ML KiwkPen INJECT 20 UNITS 3 TIMES A DAY Patient taking differently: INJECT 10 UNITS 3 TIMES A DAY AS NEEDED FOR BLOOD SUGAR 04/17/16  Yes Elayne Snare, MD  HYDROcodone-acetaminophen (NORCO) 10-325 MG tablet Take 1 tablet by mouth every 4 (four) hours as needed for moderate pain or severe pain. 06/28/15  Yes Elwin Mocha, MD  hydroxypropyl methylcellulose / hypromellose (ISOPTO TEARS / GONIOVISC) 2.5 % ophthalmic solution Place 1 drop into both eyes 4 (four) times daily as needed for dry eyes.   Yes Historical Provider, MD  Insulin Pen Needle (BD PEN NEEDLE NANO U/F) 32G X 4 MM MISC Use to inject insulin 10/30/14  Yes Elayne Snare, MD  lansoprazole (PREVACID) 30 MG capsule Take 1 capsule (30 mg total) by  mouth 2 (two) times daily before a meal. Patient taking differently: Take 30 mg by mouth daily.  05/16/14  Yes Milus Banister, MD  levothyroxine (SYNTHROID, LEVOTHROID) 137 MCG tablet TAKE 1 TABLET (137 MCG TOTAL) BY MOUTH DAILY BEFORE BREAKFAST. 02/04/16  Yes Elayne Snare, MD  lisinopril (PRINIVIL,ZESTRIL) 5 MG tablet TAKE 1 TABLET (5 MG TOTAL) BY MOUTH DAILY. 04/22/15  Yes Ajay  [name], MD  LORazepam (ATIVAN) 1 MG tablet Take 1 tablet (1 mg total) by mouth 3 times/day as needed-between meals & bedtime for anxiety. 06/28/15  Yes Elwin Mocha, MD  metFORMIN (GLUCOPHAGE-XR) 750 MG 24 hr tablet Take 2 tablets (1,500 mg total) by mouth daily. 12/20/15  Yes Elayne Snare, MD  nortriptyline (PAMELOR) 75 MG capsule Take 75 mg by mouth at bedtime. 01/24/14  Yes Historical Provider, MD  potassium chloride (KLOR-CON 10) 10 MEQ tablet Take 1 tablet (10 mEq total) by mouth daily. 03/19/16  Yes Elayne Snare, MD  pravastatin (PRAVACHOL) 20 MG tablet Take 20 mg by mouth daily.   Yes Historical Provider, MD  tolterodine (DETROL LA) 4 MG 24 hr capsule Take 4 mg by mouth daily.    Yes Historical Provider, MD  TOUJEO SOLOSTAR 300 UNIT/ML SOPN INJECT 60 UNITS INTO THE SKIN 2 TIMES DAILY. Patient taking differently: INJECT 60 UNITS INTO THE SKIN 1 TIME DAILY. 08/02/15  Yes Elayne Snare, MD    Physical Exam: Vitals:   05/10/16 0715 05/10/16 0730 05/10/16 0735 05/10/16 0745  BP: 137/62 121/68 121/68 102/83  Pulse: 73 74 73 74  Resp: 18 16 19 17   Temp:      TempSrc:      SpO2: 97% 98% 100% 97%  Weight:      Height:          Constitutional: NAD, calm, comfortable-Very sleepy and keeps drifting off to sleep during my examination Eyes: PERRL, lids and conjunctivae normal ENMT: Mucous membranes are dry. Posterior pharynx clear of any exudate or lesions. Neck: normal, supple, no masses, no thyromegaly Respiratory: clear to auscultation bilaterally, no wheezing, no crackles. Normal respiratory effort. No accessory muscle use.    Cardiovascular: Regular rate and rhythm, no murmurs / rubs / gallops. No extremity edema. 2+ pedal pulses. No carotid bruits.  Abdomen: no tenderness, no masses palpated. No hepatosplenomegaly. Bowel sounds positive.  Musculoskeletal: no clubbing / cyanosis. No joint deformity upper and lower extremities. Good ROM, no contractures. Normal muscle tone.  Skin: no rashes, lesions, ulcers. No induration Neurologic: CN 2-12 grossly intact. Sensation intact, DTR normal. Strength 5/5 x all 4 extremities.  Psychiatric: Normal judgment and insight. Awakens to verbal and tactile stimulation, oriented x 3. Normal mood. Drifts off to sleep easily   Labs on Admission: I have personally reviewed following labs and imaging studies  CBC:  Recent Labs Lab 05/10/16 0118  WBC 8.2  NEUTROABS 5.2  HGB 9.7*  HCT 29.6*  MCV 84.1  PLT 765   Basic Metabolic Panel:  Recent Labs Lab 05/10/16 0118  NA 138  K 3.4*  CL 108  CO2 22  GLUCOSE 108*  BUN 24*  CREATININE 1.62*  CALCIUM 7.8*   GFR: Estimated Creatinine Clearance: 31.2 mL/min (A) (by C-G formula based on SCr of 1.62 mg/dL (H)). Liver Function Tests: No results for input(s): AST, ALT, ALKPHOS, BILITOT, PROT, ALBUMIN in the last 168 hours. No results for input(s): LIPASE, AMYLASE in the last 168 hours. No results for input(s): AMMONIA in the last 168 hours. Coagulation Profile: No results for input(s): INR, PROTIME in the last 168 hours. Cardiac Enzymes: No results for input(s): CKTOTAL, CKMB, CKMBINDEX, TROPONINI in the last 168 hours. BNP (last 3 results) No results for input(s): PROBNP in the last 8760 hours. HbA1C: No results for input(s): HGBA1C in the last 72 hours. CBG:  Recent Labs Lab 05/10/16 0142  GLUCAP 111*   Lipid Profile: No results for input(s): CHOL,  HDL, LDLCALC, TRIG, CHOLHDL, LDLDIRECT in the last 72 hours. Thyroid Function Tests: No results for input(s): TSH, T4TOTAL, FREET4, T3FREE, THYROIDAB in the last  72 hours. Anemia Panel: No results for input(s): VITAMINB12, FOLATE, FERRITIN, TIBC, IRON, RETICCTPCT in the last 72 hours. Urine analysis:    Component Value Date/Time   COLORURINE YELLOW 05/10/2016 0201   APPEARANCEUR CLEAR 05/10/2016 0201   LABSPEC 1.010 05/10/2016 0201   PHURINE 5.0 05/10/2016 0201   GLUCOSEU >=500 (A) 05/10/2016 0201   GLUCOSEU >=1000 (A) 12/13/2015 1353   HGBUR SMALL (A) 05/10/2016 0201   BILIRUBINUR NEGATIVE 05/10/2016 0201   KETONESUR NEGATIVE 05/10/2016 0201   PROTEINUR NEGATIVE 05/10/2016 0201   UROBILINOGEN 0.2 12/13/2015 1353   NITRITE NEGATIVE 05/10/2016 0201   LEUKOCYTESUR NEGATIVE 05/10/2016 0201   Sepsis Labs: @LABRCNTIP (procalcitonin:4,lacticidven:4) )No results found for this or any previous visit (from the past 240 hour(s)).   Radiological Exams on Admission: Dg Chest Port 1 View  Result Date: 05/10/2016 CLINICAL DATA:  Cough and shortness of breath EXAM: PORTABLE CHEST 1 VIEW COMPARISON:  Chest radiograph 09/02/2015 FINDINGS: The heart size and mediastinal contours are within normal limits. Both lungs are clear. The visualized skeletal structures are unremarkable. IMPRESSION: No active disease. Electronically Signed   By: Ulyses Jarred M.D.   On: 05/10/2016 02:59   Ct Angio Chest/abd/pel For Dissection W And/or Wo Contrast  Result Date: 05/10/2016 CLINICAL DATA:  Syncope.  Claudication.  Smoker. EXAM: CT ANGIOGRAPHY CHEST, ABDOMEN AND PELVIS TECHNIQUE: Multidetector CT imaging through the chest, abdomen and pelvis was performed using the standard protocol during bolus administration of intravenous contrast. Multiplanar reconstructed images and MIPs were obtained and reviewed to evaluate the vascular anatomy. CONTRAST:  75 mL Isovue 370 COMPARISON:  None. FINDINGS: CTA CHEST FINDINGS Cardiovascular: Noncontrast CT images of the chest demonstrate normal heart size. Calcification of the aorta and coronary arteries. Small gas collection in the main  portal vein likely resulting from intravenous injection. No evidence of intramural hematoma. Images obtained during arterial phase after contrast injection demonstrate atherosclerotic changes throughout the thoracic aorta, mostly noncalcific. Can't exclude a small ulcerating plaque in the distal aortic arch. No evidence of aortic dissection or aneurysm. Great vessel origins are patent. Normal heart size. No pericardial effusion. Mediastinum/Nodes: No enlarged mediastinal, hilar, or axillary lymph nodes. Thyroid gland, trachea, and esophagus demonstrate no significant findings. Lungs/Pleura: Evaluation of the lungs is limited due to motion artifact. There are linear opacities in both lungs but mostly on the right, likely representing linear areas of atelectasis or fibrosis. There are scattered pulmonary nodules bilaterally, largest measuring about 5 mm in diameter. No focal consolidation or airspace disease. Airways are patent. No pleural effusions. No pneumothorax. Musculoskeletal: Degenerative changes in the spine. No destructive bone lesions. Review of the MIP images confirms the above findings. CTA ABDOMEN AND PELVIS FINDINGS VASCULAR Aorta: Calcific and noncalcific atherosclerotic changes. No aneurysm or dissection. Celiac: Calcification at the origin without significant stenosis. Stenotic nonocclusive atherosclerotic changes at the origin of the splenic artery. No aneurysms. SMA: Calcific and noncalcific atherosclerotic changes of the origin causing some proximal stenosis. Vessels remain patent. Renals: Single renal arteries bilaterally with calcification at the origins. No occlusion. Nephrograms are patent. IMA: Inferior mesenteric artery is patent. Inflow: Calcific and noncalcific atherosclerotic changes. Mild stenosis of the right common iliac artery. There appears to be a bypass graft arising from the left common femoral artery. The visualized portion of the graft is thrombosed without flow demonstrated.  Visualized proximal superficial femoral  arteries demonstrate significant vascular disease bilaterally. Veins: No obvious venous abnormality within the limitations of this arterial phase study. Review of the MIP images confirms the above findings. NON-VASCULAR Hepatobiliary: No focal liver abnormality is seen. Status post cholecystectomy. No biliary dilatation. Pancreas: Unremarkable. No pancreatic ductal dilatation or surrounding inflammatory changes. Spleen: Normal in size without focal abnormality. Adrenals/Urinary Tract: 11 mm diameter left adrenal gland nodule. Low density numbers are demonstrated on the noncontrast images consistent with an adenoma. Kidneys are normal, without renal calculi, focal lesion, or hydronephrosis. Bladder is unremarkable. Stomach/Bowel: Stomach, small bowel, and colon are not abnormally distended. Scattered stool throughout the colon. The colonic wall appears mildly thickened as does the terminal ileum. This could represent enterocolitis or inflammatory bowel disease. Clinical correlation recommended. Lymphatic: No significant lymphadenopathy. Reproductive: Status post hysterectomy. No adnexal masses. Other: No abdominal wall hernia or abnormality. No abdominopelvic ascites. Musculoskeletal: Degenerative changes in the spine. No destructive bone lesions. Review of the MIP images confirms the above findings. IMPRESSION: No evidence of aneurysm or dissection involving the thoracic or abdominal aorta. Severe diffuse central and peripheral calcific and noncalcific atherosclerotic changes. Proximal stenosis may be present at the splenic artery, superior mesenteric artery, and bilateral renal arteries. Visualized proximal native superficial femoral arteries demonstrates severe disease with minimal if any flow shown. There appears to be a left femoral bypass graft which is occluded. Mild diffuse thickening of the colon wall and terminal ileum could indicate enterocolitis of nonspecific  etiology. Electronically Signed   By: Lucienne Capers M.D.   On: 05/10/2016 06:37    EKG: (Independently reviewed) sinus rhythm with ventricular rate 63 bpm, QTC 451 ms, no acute ischemic changes  Assessment/Plan Principal Problem:   Orthostatic syncope -Patient presents with unwitnessed syncope in setting of orthostatic hypotension and was later found to have mild acute kidney injury -Continue volume replacement -Hold Invokana -Mobilize with assistance -Hold home antihypertensive medications for at least the first 24 hours -Repeat OVS in a.m. -Echocardiogram for completeness of evaluation -May have a degree of mild autonomic dysfunction given known peripheral vascular disease   Active Problems:   Insulin dependent type 2 diabetes mellitus, controlled  -Last evaluated by outpatient endocrinology October 2017 with recommendations to continue metformin and Invokana as well as Toujeo with meal coverage -When questioned patient confirmed endocrinology has previously told her to drink plenty of fluids while utilizing Invokana -Current CBG less than 150 -Follow CBG -SSI -Toujeo decreased to 1/2 preadmission dose while on clear liquids -HgbA1c    Acute kidney injury  -Suspect secondary to dehydration from several days of diarrhea as well as concomitant use of Invokana -Baseline: 16/1.02 -Current 24/1.62 -Holding metformin, Invokana, and lisinopril    Colitis -Patient reports "several days" of nonbloody diarrhea that is loose but not watery and has been associated with fevers and chills -No recent antibiotics -Likely viral etiology -Gastrointestinal PCR panel with contact precautions -Empiric Cipro and Flagyl IV -No current fever, leukocytosis or abdominal pain on exam with only diagnostic finding of pancolitis on CT with some involvement of the TI    Anemia -Baseline: 10.3-11.6 -Current: 9.7 -Anemia panel -FOB    Essential hypertension -With current orthostatic hypotension  and associated syncope will hold preadmission antihypertensive agents -On beta blockers and monitor for breakthrough tachycardia    CAD (coronary artery disease) -Asymptomatic with unremarkable EKG and negative troponin -Beta blocker on hold -Continue Plavix -Statin on hold until diet advanced    PVD (peripheral vascular disease)  -Last evaluated at vascular  physician on 03/27/16 -ABIs completed in February demonstrated mild right peripheral vascular disease and moderate left disease -Pletal was re-prescribed although patient has been inconsistent in taking this in the past-graduated walking program reinforced as well-history of prior left calf claudication    Carotid stenosis -Last duplex performed February 2018 that demonstrated minimal bilateral carotid artery stenosis less than 40% -History of prior TIA so on Plavix and statin prior to admission    Hypothyroidism -Continue high-dose Synthroid -Last TSH 0.9 was 12/02/2015    COPD (chronic obstructive pulmonary disease)/tobacco abuse -Currently asymptomatic without wheezing or other symptoms -Counseled regarding tobacco cessation-has stopped in the past but has not been able to be consistent -Patient admits to smoking 1 pack per day    GERD -Due to unknown etiology of diarrhea with colitis findings on imaging have held PPI    Hyperlipidemia -Statin on hold as above    Chronic pain syndrome/fibromyalgia -Continue preadmission psychotropic medications as well as narcotics      DVT prophylaxis: Lovenox Code Status: Full Family Communication: No family at bedside Disposition Plan: Home Consults called: None    ELLIS,ALLISON L. ANP-BC [name] Pager [phone]   If 7PM-7AM, please contact night-coverage [email or web address] Password [email or web address]  05/10/2016, 8:03 AM

## 2016-05-11 ENCOUNTER — Other Ambulatory Visit (HOSPITAL_COMMUNITY): Payer: Medicare Other

## 2016-05-11 DIAGNOSIS — K529 Noninfective gastroenteritis and colitis, unspecified: Secondary | ICD-10-CM

## 2016-05-11 DIAGNOSIS — N179 Acute kidney failure, unspecified: Secondary | ICD-10-CM

## 2016-05-11 DIAGNOSIS — A045 Campylobacter enteritis: Secondary | ICD-10-CM | POA: Diagnosis not present

## 2016-05-11 DIAGNOSIS — I951 Orthostatic hypotension: Secondary | ICD-10-CM | POA: Diagnosis not present

## 2016-05-11 LAB — COMPREHENSIVE METABOLIC PANEL
ALT: 8 U/L — ABNORMAL LOW (ref 14–54)
ANION GAP: 6 (ref 5–15)
AST: 13 U/L — ABNORMAL LOW (ref 15–41)
Albumin: 2.7 g/dL — ABNORMAL LOW (ref 3.5–5.0)
Alkaline Phosphatase: 82 U/L (ref 38–126)
BILIRUBIN TOTAL: 0.4 mg/dL (ref 0.3–1.2)
BUN: 16 mg/dL (ref 6–20)
CHLORIDE: 110 mmol/L (ref 101–111)
CO2: 20 mmol/L — ABNORMAL LOW (ref 22–32)
Calcium: 7.8 mg/dL — ABNORMAL LOW (ref 8.9–10.3)
Creatinine, Ser: 1.29 mg/dL — ABNORMAL HIGH (ref 0.44–1.00)
GFR calc non Af Amer: 42 mL/min — ABNORMAL LOW (ref >60)
GFR, EST AFRICAN AMERICAN: 48 mL/min — AB (ref >60)
Glucose, Bld: 116 mg/dL — ABNORMAL HIGH (ref 65–99)
POTASSIUM: 3.6 mmol/L (ref 3.5–5.1)
Sodium: 136 mmol/L (ref 135–145)
TOTAL PROTEIN: 5.7 g/dL — AB (ref 6.5–8.1)

## 2016-05-11 LAB — CBC
HEMATOCRIT: 32.3 % — AB (ref 36.0–46.0)
Hemoglobin: 10.4 g/dL — ABNORMAL LOW (ref 12.0–15.0)
MCH: 27.1 pg (ref 26.0–34.0)
MCHC: 32.2 g/dL (ref 30.0–36.0)
MCV: 84.1 fL (ref 78.0–100.0)
Platelets: 238 10*3/uL (ref 150–400)
RBC: 3.84 MIL/uL — AB (ref 3.87–5.11)
RDW: 15.2 % (ref 11.5–15.5)
WBC: 6.3 10*3/uL (ref 4.0–10.5)

## 2016-05-11 LAB — C DIFFICILE QUICK SCREEN W PCR REFLEX
C DIFFICLE (CDIFF) ANTIGEN: NEGATIVE
C Diff interpretation: NOT DETECTED
C Diff toxin: NEGATIVE

## 2016-05-11 LAB — GLUCOSE, CAPILLARY
GLUCOSE-CAPILLARY: 85 mg/dL (ref 65–99)
GLUCOSE-CAPILLARY: 102 mg/dL — AB (ref 65–99)
Glucose-Capillary: 137 mg/dL — ABNORMAL HIGH (ref 65–99)
Glucose-Capillary: 211 mg/dL — ABNORMAL HIGH (ref 65–99)

## 2016-05-11 LAB — HEMOGLOBIN A1C
HEMOGLOBIN A1C: 8.8 % — AB (ref 4.8–5.6)
Mean Plasma Glucose: 206 mg/dL

## 2016-05-11 LAB — MAGNESIUM: Magnesium: 1.8 mg/dL (ref 1.7–2.4)

## 2016-05-11 MED ORDER — INSULIN LISPRO 100 UNIT/ML (KWIKPEN): PEN_INJECTOR | SUBCUTANEOUS | 3 refills | Status: DC

## 2016-05-11 MED ORDER — AZITHROMYCIN 500 MG PO TABS: 500.0000 mg | ORAL_TABLET | Freq: Every day | ORAL | 0 refills | Status: DC

## 2016-05-11 MED ORDER — LANSOPRAZOLE 30 MG PO CPDR: 30.0000 mg | DELAYED_RELEASE_CAPSULE | Freq: Every day | ORAL | Status: AC

## 2016-05-11 MED ORDER — AZITHROMYCIN 500 MG PO TABS
500.0000 mg | ORAL_TABLET | Freq: Every day | ORAL | Status: DC
  Administered 2016-05-11: 500 mg via ORAL
  Filled 2016-05-11: qty 1

## 2016-05-11 MED ORDER — CYANOCOBALAMIN 1000 MCG/ML IJ SOLN
1000.0000 ug | Freq: Once | INTRAMUSCULAR | Status: AC
  Administered 2016-05-11: 1000 ug via INTRAMUSCULAR
  Filled 2016-05-11: qty 1

## 2016-05-11 MED ORDER — INSULIN GLARGINE 300 UNIT/ML ~~LOC~~ SOPN: 60.0000 [IU] | PEN_INJECTOR | Freq: Every day | SUBCUTANEOUS | 3 refills | Status: DC

## 2016-05-11 NOTE — Discharge Summary (Signed)
Physician Discharge Summary  April Branch QMV:[id number] DOB: [birth year] DOA: 05/10/2016  PCP: [name], MD  Admit date: 05/10/2016 Discharge date: 05/11/2016   Recommendations for Outpatient Follow-Up:   1. BMP 1 week   Discharge Diagnosis:   Principal Problem:   Orthostatic syncope Active Problems:   Hypothyroidism   Insulin dependent type 2 diabetes mellitus, controlled (HCC)   Essential hypertension   COPD (chronic obstructive pulmonary disease) (HCC)   GERD   CAD (coronary artery disease)   Hyperlipidemia   PVD (peripheral vascular disease) (HCC)   Chronic pain syndrome   Acute kidney injury ([place])   Carotid stenosis   Colitis   Anemia   Hypomagnesemia   Discharge disposition:  Home  Discharge Condition: Improved.  Diet recommendation: Low sodium, heart healthy.  Carbohydrate-modified.  Wound care: None.   History of Present Illness:   [name] is a 71 y.o. female with a Past Medical History peripheral vascular disease, diabetes, coronary artery disease, TIA, COPD who presents with syncope. Patient has dehydration from GI losses due to pancolitis. My exam abdomen is tender in left lower quadrant and distended. No rebound or guarding.   Hospital Course by Problem:   Orthostatic syncope -orthostatic BP +  -resolved with IVF -due to volume loss with diarrhea    Insulin dependent type 2 diabetes mellitus, controlled  -Last evaluated by outpatient endocrinology October 2017 with recommendations to continue metformin and Invokana as well as Toujeo with meal coverage -When questioned patient confirmed endocrinology has previously told her to drink plenty of fluids while utilizing Invokana -will hold invokana for 1 week -bring log of blood sugars to PCP    Acute kidney injury  -Suspect secondary to dehydration from several days of diarrhea as well as concomitant use of Invokana -Baseline: 16/1.02 -Current 24/1.62 -BMP 1  week  hypomagnesium -replaced  Low B12 -patient to starts OTC PO B12    Colitis due to campylobacter -Patient reports "several days" of nonbloody diarrhea that is loose but not watery and has been associated with fevers and chills -GI pathogen panel + for campylobacter--- azithromycin x 3 days -patient instructed on good hand hygiene    Anemia -Baseline: 10.3-11.6 -Current: 9.7    Essential hypertension -resume home meds -improved with volume expansion/IVF    COPD (chronic obstructive pulmonary disease)/tobacco abuse -Currently asymptomatic without wheezing or other symptoms -Counseled regarding tobacco cessation-has stopped in the past but has not been able to be consistent -Patient admits to smoking 1 pack per day     Medical Consultants:    None.   Discharge Exam:   Vitals:   05/10/16 2035 05/11/16 0433  BP: (!) 126/53 (!) 142/55  Pulse: 68 83  Resp: 18 18  Temp: 97.7 F (36.5 C) 98.5 F (36.9 C)   Vitals:   05/10/16 0800 05/10/16 0901 05/10/16 2035 05/11/16 0433  BP: 118/77 (!) 162/62 (!) 126/53 (!) 142/55  Pulse: 71 70 68 83  Resp: 17 18 18 18   Temp:  98 F (36.7 C) 97.7 F (36.5 C) 98.5 F (36.9 C)  TempSrc:  Oral Oral Oral  SpO2: 98% 97% 96% 95%  Weight:  77.1 kg (169 lb 15.6 oz)  77 kg (169 lb 12.8 oz)  Height:  5\' 1"  (1.549 m)      Gen:  NAD- no further diarrhea   The results of significant diagnostics from this hospitalization (including imaging, microbiology, ancillary and laboratory) are listed below for reference.     Procedures and  Diagnostic Studies:   Dg Chest Port 1 View  Result Date: 05/10/2016 CLINICAL DATA:  Cough and shortness of breath EXAM: PORTABLE CHEST 1 VIEW COMPARISON:  Chest radiograph 09/02/2015 FINDINGS: The heart size and mediastinal contours are within normal limits. Both lungs are clear. The visualized skeletal structures are unremarkable. IMPRESSION: No active disease. Electronically Signed   By: Ulyses Jarred M.D.   On: 05/10/2016 02:59   Ct Angio Chest/abd/pel For Dissection W And/or Wo Contrast  Result Date: 05/10/2016 CLINICAL DATA:  Syncope.  Claudication.  Smoker. EXAM: CT ANGIOGRAPHY CHEST, ABDOMEN AND PELVIS TECHNIQUE: Multidetector CT imaging through the chest, abdomen and pelvis was performed using the standard protocol during bolus administration of intravenous contrast. Multiplanar reconstructed images and MIPs were obtained and reviewed to evaluate the vascular anatomy. CONTRAST:  75 mL Isovue 370 COMPARISON:  None. FINDINGS: CTA CHEST FINDINGS Cardiovascular: Noncontrast CT images of the chest demonstrate normal heart size. Calcification of the aorta and coronary arteries. Small gas collection in the main portal vein likely resulting from intravenous injection. No evidence of intramural hematoma. Images obtained during arterial phase after contrast injection demonstrate atherosclerotic changes throughout the thoracic aorta, mostly noncalcific. Can't exclude a small ulcerating plaque in the distal aortic arch. No evidence of aortic dissection or aneurysm. Great vessel origins are patent. Normal heart size. No pericardial effusion. Mediastinum/Nodes: No enlarged mediastinal, hilar, or axillary lymph nodes. Thyroid gland, trachea, and esophagus demonstrate no significant findings. Lungs/Pleura: Evaluation of the lungs is limited due to motion artifact. There are linear opacities in both lungs but mostly on the right, likely representing linear areas of atelectasis or fibrosis. There are scattered pulmonary nodules bilaterally, largest measuring about 5 mm in diameter. No focal consolidation or airspace disease. Airways are patent. No pleural effusions. No pneumothorax. Musculoskeletal: Degenerative changes in the spine. No destructive bone lesions. Review of the MIP images confirms the above findings. CTA ABDOMEN AND PELVIS FINDINGS VASCULAR Aorta: Calcific and noncalcific atherosclerotic changes. No  aneurysm or dissection. Celiac: Calcification at the origin without significant stenosis. Stenotic nonocclusive atherosclerotic changes at the origin of the splenic artery. No aneurysms. SMA: Calcific and noncalcific atherosclerotic changes of the origin causing some proximal stenosis. Vessels remain patent. Renals: Single renal arteries bilaterally with calcification at the origins. No occlusion. Nephrograms are patent. IMA: Inferior mesenteric artery is patent. Inflow: Calcific and noncalcific atherosclerotic changes. Mild stenosis of the right common iliac artery. There appears to be a bypass graft arising from the left common femoral artery. The visualized portion of the graft is thrombosed without flow demonstrated. Visualized proximal superficial femoral arteries demonstrate significant vascular disease bilaterally. Veins: No obvious venous abnormality within the limitations of this arterial phase study. Review of the MIP images confirms the above findings. NON-VASCULAR Hepatobiliary: No focal liver abnormality is seen. Status post cholecystectomy. No biliary dilatation. Pancreas: Unremarkable. No pancreatic ductal dilatation or surrounding inflammatory changes. Spleen: Normal in size without focal abnormality. Adrenals/Urinary Tract: 11 mm diameter left adrenal gland nodule. Low density numbers are demonstrated on the noncontrast images consistent with an adenoma. Kidneys are normal, without renal calculi, focal lesion, or hydronephrosis. Bladder is unremarkable. Stomach/Bowel: Stomach, small bowel, and colon are not abnormally distended. Scattered stool throughout the colon. The colonic wall appears mildly thickened as does the terminal ileum. This could represent enterocolitis or inflammatory bowel disease. Clinical correlation recommended. Lymphatic: No significant lymphadenopathy. Reproductive: Status post hysterectomy. No adnexal masses. Other: No abdominal wall hernia or abnormality. No abdominopelvic  ascites. Musculoskeletal: Degenerative  changes in the spine. No destructive bone lesions. Review of the MIP images confirms the above findings. IMPRESSION: No evidence of aneurysm or dissection involving the thoracic or abdominal aorta. Severe diffuse central and peripheral calcific and noncalcific atherosclerotic changes. Proximal stenosis may be present at the splenic artery, superior mesenteric artery, and bilateral renal arteries. Visualized proximal native superficial femoral arteries demonstrates severe disease with minimal if any flow shown. There appears to be a left femoral bypass graft which is occluded. Mild diffuse thickening of the colon wall and terminal ileum could indicate enterocolitis of nonspecific etiology. Electronically Signed   By: Lucienne Capers M.D.   On: 05/10/2016 06:37     Labs:   Basic Metabolic Panel:  Recent Labs Lab 05/10/16 0118 05/10/16 1047 05/11/16 0156  NA 138  --  136  K 3.4*  --  3.6  CL 108  --  110  CO2 22  --  20*  GLUCOSE 108*  --  116*  BUN 24*  --  16  CREATININE 1.62*  --  1.29*  CALCIUM 7.8*  --  7.8*  MG  --  1.1* 1.8  PHOS  --  3.9  --    GFR Estimated Creatinine Clearance: 39.2 mL/min (A) (by C-G formula based on SCr of 1.29 mg/dL (H)). Liver Function Tests:  Recent Labs Lab 05/11/16 0156  AST 13*  ALT 8*  ALKPHOS 82  BILITOT 0.4  PROT 5.7*  ALBUMIN 2.7*   No results for input(s): LIPASE, AMYLASE in the last 168 hours. No results for input(s): AMMONIA in the last 168 hours. Coagulation profile No results for input(s): INR, PROTIME in the last 168 hours.  CBC:  Recent Labs Lab 05/10/16 0118 05/11/16 0156  WBC 8.2 6.3  NEUTROABS 5.2  --   HGB 9.7* 10.4*  HCT 29.6* 32.3*  MCV 84.1 84.1  PLT 246 238   Cardiac Enzymes: No results for input(s): CKTOTAL, CKMB, CKMBINDEX, TROPONINI in the last 168 hours. BNP: Invalid input(s): POCBNP CBG:  Recent Labs Lab 05/10/16 1746 05/10/16 2102 05/11/16 0100  05/11/16 0426 05/11/16 0834  GLUCAP 89 162* 137* 85 102*   D-Dimer  Recent Labs  05/10/16 0118  DDIMER 1.26*   Hgb A1c  Recent Labs  05/10/16 0118  HGBA1C 8.8*   Lipid Profile No results for input(s): CHOL, HDL, LDLCALC, TRIG, CHOLHDL, LDLDIRECT in the last 72 hours. Thyroid function studies No results for input(s): TSH, T4TOTAL, T3FREE, THYROIDAB in the last 72 hours.  Invalid input(s): FREET3 Anemia work up  Recent Labs  05/10/16 1047  VITAMINB12 192  FOLATE 17.9  FERRITIN 43  TIBC 235*  IRON 52  RETICCTPCT 1.2   Microbiology Recent Results (from the past 240 hour(s))  Gastrointestinal Panel by PCR , Stool     Status: Abnormal   Collection Time: 05/10/16  7:37 AM  Result Value Ref Range Status   Campylobacter species DETECTED (A) NOT DETECTED Final    Comment: RESULT CALLED TO, READ BACK BY AND VERIFIED WITH: ALICIA WADE 1/82/99 @ 1652  MLK    Plesimonas shigelloides NOT DETECTED NOT DETECTED Final   Salmonella species NOT DETECTED NOT DETECTED Final   Yersinia enterocolitica NOT DETECTED NOT DETECTED Final   Vibrio species NOT DETECTED NOT DETECTED Final   Vibrio cholerae NOT DETECTED NOT DETECTED Final   Enteroaggregative E coli (EAEC) NOT DETECTED NOT DETECTED Final   Enteropathogenic E coli (EPEC) DETECTED (A) NOT DETECTED Final    Comment: RESULT CALLED TO, READ  BACK BY AND VERIFIED WITH: ALICIA WADE 2/68/34 @ 1652  MLK    Enterotoxigenic E coli (ETEC) NOT DETECTED NOT DETECTED Final   Shiga like toxin producing E coli (STEC) NOT DETECTED NOT DETECTED Final   Shigella/Enteroinvasive E coli (EIEC) NOT DETECTED NOT DETECTED Final   Cryptosporidium NOT DETECTED NOT DETECTED Final   Cyclospora cayetanensis NOT DETECTED NOT DETECTED Final   Entamoeba histolytica NOT DETECTED NOT DETECTED Final   Giardia lamblia NOT DETECTED NOT DETECTED Final   Adenovirus F40/41 NOT DETECTED NOT DETECTED Final   Astrovirus NOT DETECTED NOT DETECTED Final   Norovirus  GI/GII NOT DETECTED NOT DETECTED Final   Rotavirus A NOT DETECTED NOT DETECTED Final   Sapovirus (I, II, IV, and V) NOT DETECTED NOT DETECTED Final     Discharge Instructions:   Discharge Instructions    Diet Carb Modified    Complete by:  As directed    Discharge instructions    Complete by:  As directed    BMP 1 week Resume invokana in 1 week   Increase activity slowly    Complete by:  As directed      Allergies as of 05/11/2016   No Known Allergies     Medication List    STOP taking these medications   INVOKANA 100 MG Tabs tablet Generic drug:  canagliflozin     TAKE these medications   ACCU-CHEK FASTCLIX LANCETS Misc Use 4 per day to check blood sugar dx code E11.9   atenolol 25 MG tablet Commonly known as:  TENORMIN Take 50 mg by mouth daily.   azithromycin 500 MG tablet Commonly known as:  ZITHROMAX Take 1 tablet (500 mg total) by mouth daily. Start taking on:  05/12/2016   cilostazol 100 MG tablet Commonly known as:  PLETAL Take 1 tablet (100 mg total) by mouth 2 (two) times daily before a meal.   clopidogrel 75 MG tablet Commonly known as:  PLAVIX Take 75 mg by mouth daily.   DULoxetine 60 MG capsule Commonly known as:  CYMBALTA Take 1 capsule (60 mg total) by mouth daily.   ferrous sulfate 325 (65 FE) MG tablet Take 325 mg by mouth 2 (two) times daily with a meal.   gabapentin 100 MG capsule Commonly known as:  NEURONTIN Take 200 mg by mouth at bedtime.   HYDROcodone-acetaminophen 10-325 MG tablet Commonly known as:  NORCO Take 1 tablet by mouth every 4 (four) hours as needed for moderate pain or severe pain.   hydroxypropyl methylcellulose / hypromellose 2.5 % ophthalmic solution Commonly known as:  ISOPTO TEARS / GONIOVISC Place 1 drop into both eyes 4 (four) times daily as needed for dry eyes.   Insulin Glargine 300 UNIT/ML Sopn Commonly known as:  TOUJEO SOLOSTAR Inject 60 Units into the skin daily. What changed:  See the new  instructions.   insulin lispro 100 UNIT/ML KiwkPen Commonly known as:  HUMALOG KWIKPEN INJECT 10 UNITS 3 TIMES A DAY AS NEEDED FOR BLOOD SUGAR What changed:  See the new instructions.   Insulin Pen Needle 32G X 4 MM Misc Commonly known as:  BD PEN NEEDLE NANO U/F Use to inject insulin   lansoprazole 30 MG capsule Commonly known as:  PREVACID Take 1 capsule (30 mg total) by mouth daily.   levothyroxine 137 MCG tablet Commonly known as:  SYNTHROID, LEVOTHROID TAKE 1 TABLET (137 MCG TOTAL) BY MOUTH DAILY BEFORE BREAKFAST.   lisinopril 5 MG tablet Commonly known as:  PRINIVIL,ZESTRIL TAKE 1  TABLET (5 MG TOTAL) BY MOUTH DAILY.   LORazepam 1 MG tablet Commonly known as:  ATIVAN Take 1 tablet (1 mg total) by mouth 3 times/day as needed-between meals & bedtime for anxiety.   metFORMIN 750 MG 24 hr tablet Commonly known as:  GLUCOPHAGE-XR Take 2 tablets (1,500 mg total) by mouth daily.   nortriptyline 75 MG capsule Commonly known as:  PAMELOR Take 75 mg by mouth at bedtime.   potassium chloride 10 MEQ tablet Commonly known as:  KLOR-CON 10 Take 1 tablet (10 mEq total) by mouth daily.   pravastatin 20 MG tablet Commonly known as:  PRAVACHOL Take 20 mg by mouth daily.   tolterodine 4 MG 24 hr capsule Commonly known as:  DETROL LA Take 4 mg by mouth daily.         Time coordinating discharge: 33 min  Signed:  JESSICA U VANN   [name] 05/11/2016, 11:49 AM

## 2016-05-11 NOTE — Progress Notes (Signed)
April Branch to be D/C'd Home per MD order. Discussed with the patient and all questions fully answered.    VVS, Skin clean, dry and intact without evidence of skin break down, no evidence of skin tears noted.  IV catheter discontinued intact. Site without signs and symptoms of complications. Dressing and pressure applied.  An After Visit Summary was printed and given to the patient.  Patient escorted via [employer], and D/C home via private auto.  [employer]  05/11/2016 2:07 PM

## 2016-06-17 ENCOUNTER — Other Ambulatory Visit: Payer: Self-pay | Admitting: Endocrinology

## 2016-06-17 NOTE — Telephone Encounter (Signed)
Refuse 

## 2016-06-17 NOTE — Telephone Encounter (Signed)
Last ov 12/18/15 has had 2 no shows and no future scheduled ok to refill please advise

## 2016-06-22 ENCOUNTER — Other Ambulatory Visit: Payer: Self-pay

## 2016-06-23 ENCOUNTER — Other Ambulatory Visit: Payer: Self-pay

## 2016-06-25 ENCOUNTER — Telehealth: Payer: Self-pay

## 2016-06-25 NOTE — Telephone Encounter (Signed)
Called patient and left a message to call our office to schedule an appointment.

## 2016-07-09 ENCOUNTER — Other Ambulatory Visit: Payer: Self-pay | Admitting: Endocrinology

## 2016-07-29 ENCOUNTER — Other Ambulatory Visit: Payer: Self-pay | Admitting: Endocrinology

## 2016-08-05 ENCOUNTER — Other Ambulatory Visit: Payer: Self-pay

## 2016-08-06 ENCOUNTER — Other Ambulatory Visit: Payer: Self-pay

## 2016-08-13 ENCOUNTER — Other Ambulatory Visit: Payer: Self-pay | Admitting: Endocrinology

## 2016-08-31 ENCOUNTER — Emergency Department (HOSPITAL_COMMUNITY)
Admission: EM | Admit: 2016-08-31 | Discharge: 2016-08-31 | Discharge status: Against Medical Advice | Payer: Medicare Other | Attending: Emergency Medicine | Admitting: Emergency Medicine

## 2016-08-31 ENCOUNTER — Emergency Department (HOSPITAL_COMMUNITY): Payer: Medicare Other

## 2016-08-31 ENCOUNTER — Encounter (HOSPITAL_COMMUNITY): Payer: Self-pay | Admitting: *Deleted

## 2016-08-31 DIAGNOSIS — R42 Dizziness and giddiness: Secondary | ICD-10-CM | POA: Diagnosis not present

## 2016-08-31 DIAGNOSIS — Z5321 Procedure and treatment not carried out due to patient leaving prior to being seen by health care provider: Secondary | ICD-10-CM | POA: Insufficient documentation

## 2016-08-31 LAB — COMPREHENSIVE METABOLIC PANEL
ALBUMIN: 3.9 g/dL (ref 3.5–5.0)
ALT: 9 U/L — AB (ref 14–54)
ANION GAP: 10 (ref 5–15)
AST: 14 U/L — ABNORMAL LOW (ref 15–41)
Alkaline Phosphatase: 106 U/L (ref 38–126)
BUN: 15 mg/dL (ref 6–20)
CHLORIDE: 100 mmol/L — AB (ref 101–111)
CO2: 26 mmol/L (ref 22–32)
Calcium: 8.2 mg/dL — ABNORMAL LOW (ref 8.9–10.3)
Creatinine, Ser: 1.63 mg/dL — ABNORMAL HIGH (ref 0.44–1.00)
GFR calc non Af Amer: 31 mL/min — ABNORMAL LOW (ref >60)
GFR, EST AFRICAN AMERICAN: 36 mL/min — AB (ref >60)
GLUCOSE: 263 mg/dL — AB (ref 65–99)
Potassium: 3.4 mmol/L — ABNORMAL LOW (ref 3.5–5.1)
SODIUM: 136 mmol/L (ref 135–145)
Total Bilirubin: 0.2 mg/dL — ABNORMAL LOW (ref 0.3–1.2)
Total Protein: 7.2 g/dL (ref 6.5–8.1)

## 2016-08-31 LAB — DIFFERENTIAL
BASOS PCT: 1 %
Basophils Absolute: 0.1 10*3/uL (ref 0.0–0.1)
EOS PCT: 1 %
Eosinophils Absolute: 0.1 10*3/uL (ref 0.0–0.7)
LYMPHS PCT: 23 %
Lymphs Abs: 2.1 10*3/uL (ref 0.7–4.0)
Monocytes Absolute: 0.4 10*3/uL (ref 0.1–1.0)
Monocytes Relative: 4 %
NEUTROS ABS: 6.4 10*3/uL (ref 1.7–7.7)
NEUTROS PCT: 71 %

## 2016-08-31 LAB — CBC
HCT: 33.9 % — ABNORMAL LOW (ref 36.0–46.0)
Hemoglobin: 11.2 g/dL — ABNORMAL LOW (ref 12.0–15.0)
MCH: 28.1 pg (ref 26.0–34.0)
MCHC: 33 g/dL (ref 30.0–36.0)
MCV: 85 fL (ref 78.0–100.0)
PLATELETS: 319 10*3/uL (ref 150–400)
RBC: 3.99 MIL/uL (ref 3.87–5.11)
RDW: 14.8 % (ref 11.5–15.5)
WBC: 9 10*3/uL (ref 4.0–10.5)

## 2016-08-31 LAB — I-STAT CHEM 8, ED
BUN: 18 mg/dL (ref 6–20)
CHLORIDE: 100 mmol/L — AB (ref 101–111)
Calcium, Ion: 0.98 mmol/L — ABNORMAL LOW (ref 1.15–1.40)
Creatinine, Ser: 1.5 mg/dL — ABNORMAL HIGH (ref 0.44–1.00)
Glucose, Bld: 264 mg/dL — ABNORMAL HIGH (ref 65–99)
HCT: 36 % (ref 36.0–46.0)
Hemoglobin: 12.2 g/dL (ref 12.0–15.0)
POTASSIUM: 3.4 mmol/L — AB (ref 3.5–5.1)
SODIUM: 139 mmol/L (ref 135–145)
TCO2: 26 mmol/L (ref 0–100)

## 2016-08-31 LAB — PROTIME-INR
INR: 0.99
PROTHROMBIN TIME: 13.1 s (ref 11.4–15.2)

## 2016-08-31 LAB — APTT: aPTT: 28 seconds (ref 24–36)

## 2016-08-31 LAB — I-STAT TROPONIN, ED: Troponin i, poc: 0 ng/mL (ref 0.00–0.08)

## 2016-08-31 NOTE — ED Notes (Signed)
Pt states that they are leaving due to wait time, unable to talk pt into staying

## 2016-08-31 NOTE — ED Triage Notes (Addendum)
Pt arrives via POv from home with stroke like symptoms for the last several weeks. States feels dizzy, notes right sided weakness and several falls of the last couple weeks as well. Pt with right sided facial droop,speech clear, no drift. VSS.

## 2016-09-01 ENCOUNTER — Emergency Department (HOSPITAL_COMMUNITY)
Admission: EM | Admit: 2016-09-01 | Discharge: 2016-09-01 | Disposition: A | Discharge status: Home / Self Care | Payer: Medicare Other | Attending: Emergency Medicine | Admitting: Emergency Medicine

## 2016-09-01 ENCOUNTER — Encounter (HOSPITAL_COMMUNITY): Payer: Self-pay | Admitting: Emergency Medicine

## 2016-09-01 ENCOUNTER — Emergency Department (HOSPITAL_COMMUNITY): Payer: Medicare Other

## 2016-09-01 DIAGNOSIS — I2510 Atherosclerotic heart disease of native coronary artery without angina pectoris: Secondary | ICD-10-CM | POA: Insufficient documentation

## 2016-09-01 DIAGNOSIS — R29898 Other symptoms and signs involving the musculoskeletal system: Secondary | ICD-10-CM

## 2016-09-01 DIAGNOSIS — Z8673 Personal history of transient ischemic attack (TIA), and cerebral infarction without residual deficits: Secondary | ICD-10-CM | POA: Insufficient documentation

## 2016-09-01 DIAGNOSIS — Z85528 Personal history of other malignant neoplasm of kidney: Secondary | ICD-10-CM | POA: Diagnosis not present

## 2016-09-01 DIAGNOSIS — Z7984 Long term (current) use of oral hypoglycemic drugs: Secondary | ICD-10-CM | POA: Insufficient documentation

## 2016-09-01 DIAGNOSIS — E039 Hypothyroidism, unspecified: Secondary | ICD-10-CM | POA: Insufficient documentation

## 2016-09-01 DIAGNOSIS — Z87891 Personal history of nicotine dependence: Secondary | ICD-10-CM | POA: Insufficient documentation

## 2016-09-01 DIAGNOSIS — R531 Weakness: Secondary | ICD-10-CM | POA: Insufficient documentation

## 2016-09-01 DIAGNOSIS — E119 Type 2 diabetes mellitus without complications: Secondary | ICD-10-CM | POA: Insufficient documentation

## 2016-09-01 DIAGNOSIS — Z794 Long term (current) use of insulin: Secondary | ICD-10-CM | POA: Diagnosis not present

## 2016-09-01 DIAGNOSIS — Z7902 Long term (current) use of antithrombotics/antiplatelets: Secondary | ICD-10-CM | POA: Diagnosis not present

## 2016-09-01 DIAGNOSIS — I10 Essential (primary) hypertension: Secondary | ICD-10-CM | POA: Insufficient documentation

## 2016-09-01 LAB — CBG MONITORING, ED: Glucose-Capillary: 257 mg/dL — ABNORMAL HIGH (ref 65–99)

## 2016-09-01 MED ORDER — ACETAMINOPHEN 500 MG PO TABS
1000.0000 mg | ORAL_TABLET | Freq: Once | ORAL | Status: AC
  Administered 2016-09-01: 1000 mg via ORAL
  Filled 2016-09-01: qty 2

## 2016-09-01 NOTE — ED Triage Notes (Signed)
Pt. Stated, I was here yesterday and I left cause it was too long wait.  I still have some weakness on rt. Arm for about 2 weeks.  Im also having arm pain and leg pain on the right.

## 2016-09-01 NOTE — ED Notes (Signed)
Transported to MRI

## 2016-09-01 NOTE — ED Notes (Signed)
Patient presents to ed c/o weakness with her right hand and confusion. Friend states this has been going on for approx. 4 months. Patient came to ed yest however said she didn;t want to wait. Alert oriented to to place and month, unable to tell me the day , year or president. However she can tell me she was here yest and had to wait. Was also able to give me information about her pcp.

## 2016-09-01 NOTE — Discharge Instructions (Signed)
Follow-up with [facility] neurology if your symptoms are not improving in the next few days. Their contact information has been provided in this discharge summary for you to call and make these arrangements.  Return to the emergency department if your symptoms significantly worsen or change.

## 2016-09-01 NOTE — ED Notes (Signed)
Assisted pt to the restroom 

## 2016-09-01 NOTE — ED Triage Notes (Signed)
Pt. Had labs yesterday and CT of head yesterday.

## 2016-09-01 NOTE — ED Provider Notes (Signed)
[facility] DEPT Provider Note   CSN: [id number] Arrival date & time: 09/01/16  0827     History   Chief Complaint Chief Complaint  Patient presents with  . Weakness  . Arm Pain  . Leg Pain    HPI April Branch is a 71 y.o. female.  Patient is a 71 year old female with past medical history of diabetes, coronary artery disease, TIA presenting with complaints of right arm and leg weakness. This is been ongoing for the past 2 weeks. She reports dropping objects and having difficulty with ambulation. She presented here yesterday evening and underwent a head CT and blood work. She left AMA from the waiting room after a prolonged wait, then returns today for evaluation.   The history is provided by the patient.  Weakness  Primary symptoms include focal weakness. This is a new problem. Episode onset: 2 weeks ago. The problem has not changed since onset.There was right upper extremity and right lower extremity focality noted. There has been no fever. Associated symptoms include headaches. Pertinent negatives include no chest pain and no confusion.  Arm Pain  Associated symptoms include headaches. Pertinent negatives include no chest pain.  Leg Pain      Past Medical History:  Diagnosis Date  . Anemia   . Anginal pain ([place])    1 MONTH AGO   . Anxiety   . Arthritis   . CAD (coronary artery disease)   . CAD (coronary artery disease) of bypass graft   . 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 Colon polyps 09/11/2010   Tubular adenoma  . COPD 12/14/2008   PATIENT DENIES    . Depression   . Diabetes mellitus age 24   insulin dependent  . Diabetic coma ([place]) Feb. 2014  . Fall at home Jan. 2, 2016  Jan. 13, 2016   Left knee  . Fibromyalgia   . GERD 07/20/2006  . GERD (gastroesophageal reflux disease)   . Headache(784.0)   . History of transient ischemic attack (TIA)   . HPV (human papilloma virus) infection   . Hyperlipidemia   .  Hypertension   . Hypothyroidism   . Insulin dependent type 2 diabetes mellitus, controlled ([place])   . Lumbar disc disease   . Myocardial infarction ([place])    AGE 79    . Neuromuscular disorder ([place])    PERIPHERAL NEUROPATHY  . Peripheral vascular disease ([place])   . Personal history of malignant neoplasm of kidney(V10.52) 12/14/2008   Laparoscopic biopsy and cryoablation 7/08 Dr. Vernie Shanks   . Stroke [facility])    3 MINI STROKES  RT SIDED WEAKNESS  . Vertigo     Patient Active Problem List   Diagnosis Date Noted  . Orthostatic syncope 05/10/2016  . Colitis 05/10/2016  . Anemia 05/10/2016  . Hypomagnesemia 05/10/2016  . Food impaction of esophagus 09/02/2015  . Diabetic foot ulcer ([place]) 06/24/2015  . Atheroscler of bypass graft of left leg with intermittent claudication ([place]) 04/17/2014  . Carotid stenosis 05/19/2013  . Acute kidney injury (nontraumatic) ([place]) 09/21/2012  . Chronic pain syndrome 03/13/2012  . Acute kidney injury ([place]) 03/13/2012  . Atherosclerosis of native arteries of the extremities with intermittent claudication 10/08/2011  . PVD (peripheral vascular disease) ([place]) 07/30/2011  . CAD (coronary artery disease) 05/10/2011  . Hyperlipidemia   . Unstable angina ([place]) 05/09/2011  . CERVICAL CANCER 12/14/2008  . Personal history of malignant neoplasm of kidney(V10.52) 12/14/2008  .  COPD (chronic obstructive pulmonary disease) ([place]) 12/14/2008  . IBS 12/14/2008  . Essential hypertension   . Personal history of colonic polyps 08/13/2008  . DISORDER, TOBACCO USE 08/04/2006  . Hypothyroidism   . Insulin dependent type 2 diabetes mellitus, controlled ([place])   . Lumbar disc disease   . ANXIETY STATE NOS 07/20/2006  . GERD 07/20/2006  . HX, PERSONAL, VENOUS THROMBOSIS/EMBOLISM 07/20/2006    Past Surgical History:  Procedure Laterality Date  . ABDOMINAL AORTAGRAM N/A 08/21/2011   Procedure: ABDOMINAL Maxcine Ham;  Surgeon: Elam Dutch, MD;  Location: [facility] CATH LAB;   Service: Cardiovascular;  Laterality: N/A;  . ABDOMINAL AORTAGRAM N/A 03/16/2014   Procedure: ABDOMINAL Maxcine Ham;  Surgeon: Elam Dutch, MD;  Location: [facility] CATH LAB;  Service: Cardiovascular;  Laterality: N/A;  . ABDOMINAL HYSTERECTOMY    . APPENDECTOMY    . BLADDER SUSPENSION    . CARDIAC CATHETERIZATION    . CHOLECYSTECTOMY OPEN    . ESOPHAGOGASTRODUODENOSCOPY (EGD) WITH PROPOFOL N/A 05/16/2014   Procedure: ESOPHAGOGASTRODUODENOSCOPY (EGD) WITH PROPOFOL with Balloon dilation;  Surgeon: Milus Banister, MD;  Location: [facility];  Service: Endoscopy;  Laterality: N/A;  . ESOPHAGOGASTRODUODENOSCOPY (EGD) WITH PROPOFOL N/A 09/02/2015   Procedure: ESOPHAGOGASTRODUODENOSCOPY (EGD) WITH PROPOFOL;  Surgeon: Doran Stabler, MD;  Location: [facility];  Service: Gastroenterology;  Laterality: N/A;  . FEMORAL-POPLITEAL BYPASS GRAFT  10/12/2011   Procedure: BYPASS GRAFT FEMORAL-POPLITEAL ARTERY;  Surgeon: Elam Dutch, MD;  Location: [facility] OR;  Service: Vascular;  Laterality: Left;  Left Femoral-Popliteal Bypass Graft using 88mm x 80cm Propaten Graft with intraop arteriogram times one.  . FEMORAL-POPLITEAL BYPASS GRAFT Left 04/17/2014   Procedure: REDO LEFT FEMORAL-POPLITEAL ARTERY BYPASS USING GORE PROPATEN 51mmx80cm GRAFT;  Surgeon: Elam Dutch, MD;  Location: [facility];  Service: Vascular;  Laterality: Left;  . FOOT SURGERY Left    "bone spurs"  . INTRAOPERATIVE ARTERIOGRAM  10/12/2011   Procedure: INTRA OPERATIVE ARTERIOGRAM;  Surgeon: Elam Dutch, MD;  Location: [facility];  Service: Vascular;  Laterality: Left;  . KNEE ARTHROSCOPY Bilateral    "cartilage"  . Laparascopic cryoablation of left kidney  08/2006   Dr. Gladis Riffle for renal cell cancer  . LEFT HEART CATHETERIZATION WITH CORONARY ANGIOGRAM N/A 05/11/2011   Procedure: LEFT HEART CATHETERIZATION WITH CORONARY ANGIOGRAM;  Surgeon: Jolaine Artist, MD;  Location: [facility] CATH LAB;  Service: Cardiovascular;  Laterality: N/A;  . LUNG SURGERY  Right    lung nodule removed from the right side  . PR VEIN BYPASS GRAFT,AORTO-FEM-POP  05/27/2010  . SHOULDER ARTHROSCOPY Bilateral    'Spurs"  . TUBAL LIGATION      OB History    No data available       Home Medications    Prior to Admission medications   Medication Sig Start Date End Date Taking? Authorizing Provider  ACCU-CHEK FASTCLIX LANCETS MISC Use 4 per day to check blood sugar dx code E11.9 11/29/14   Elayne Snare, MD  atenolol (TENORMIN) 25 MG tablet Take 50 mg by mouth daily.    [provider]  azithromycin (ZITHROMAX) 500 MG tablet Take 1 tablet (500 mg total) by mouth daily. 05/12/16   Geradine Girt, DO  cilostazol (PLETAL) 100 MG tablet Take 1 tablet (100 mg total) by mouth 2 (two) times daily before a meal. 03/17/16   Elam Dutch, MD  clopidogrel (PLAVIX) 75 MG tablet Take 75 mg by mouth daily.  04/02/14   [provider]  DULoxetine (CYMBALTA)  60 MG capsule Take 1 capsule (60 mg total) by mouth daily. 03/19/16   Elayne Snare, MD  ferrous sulfate 325 (65 FE) MG tablet Take 325 mg by mouth 2 (two) times daily with a meal.    [provider]  gabapentin (NEURONTIN) 100 MG capsule Take 200 mg by mouth at bedtime.  01/07/15   [provider]  HYDROcodone-acetaminophen (NORCO) 10-325 MG tablet Take 1 tablet by mouth every 4 (four) hours as needed for moderate pain or severe pain. 06/28/15   Elwin Mocha, MD  hydroxypropyl methylcellulose / hypromellose (ISOPTO TEARS / GONIOVISC) 2.5 % ophthalmic solution Place 1 drop into both eyes 4 (four) times daily as needed for dry eyes.    [provider]  Insulin Glargine (TOUJEO SOLOSTAR) 300 UNIT/ML SOPN Inject 60 Units into the skin daily. 05/11/16   Geradine Girt, DO  insulin lispro (HUMALOG KWIKPEN) 100 UNIT/ML KiwkPen INJECT 10 UNITS 3 TIMES A DAY AS NEEDED FOR BLOOD SUGAR 05/11/16   Eulogio Bear U, DO  Insulin Pen Needle (BD PEN NEEDLE NANO U/F) 32G X 4 MM MISC Use to inject insulin  10/30/14   Elayne Snare, MD  lansoprazole (PREVACID) 30 MG capsule Take 1 capsule (30 mg total) by mouth daily. 05/11/16   Geradine Girt, DO  levothyroxine (SYNTHROID, LEVOTHROID) 137 MCG tablet TAKE 1 TABLET (137 MCG TOTAL) BY MOUTH DAILY BEFORE BREAKFAST. 02/04/16   Elayne Snare, MD  lisinopril (PRINIVIL,ZESTRIL) 5 MG tablet TAKE 1 TABLET (5 MG TOTAL) BY MOUTH DAILY. 04/22/15   Elayne Snare, MD  LORazepam (ATIVAN) 1 MG tablet Take 1 tablet (1 mg total) by mouth 3 times/day as needed-between meals & bedtime for anxiety. 06/28/15   Elwin Mocha, MD  metFORMIN (GLUCOPHAGE-XR) 750 MG 24 hr tablet TAKE 2 TABLETS BY MOUTH EVERY DAY 08/13/16   Elayne Snare, MD  nortriptyline (PAMELOR) 75 MG capsule Take 75 mg by mouth at bedtime. 01/24/14   [provider]  potassium chloride (KLOR-CON 10) 10 MEQ tablet Take 1 tablet (10 mEq total) by mouth daily. 03/19/16   Elayne Snare, MD  pravastatin (PRAVACHOL) 20 MG tablet Take 20 mg by mouth daily.    [provider]  tolterodine (DETROL LA) 4 MG 24 hr capsule Take 4 mg by mouth daily.     [provider]    Family History Family History  Problem Relation Age of Onset  . Heart disease Mother   . Lung cancer Mother   . Cancer Mother        Lung  . Heart disease Father   . Lung cancer Father   . Cancer Father        Lung  . Heart disease Sister        CABG- Open Heart    Social History Social History  Substance Use Topics  . Smoking status: Former Smoker    Packs/day: 0.25    Years: 50.00    Types: Cigarettes    Quit date: 06/24/2014  . Smokeless tobacco: Never Used  . Alcohol use No     Allergies   Patient has no known allergies.   Review of Systems Review of Systems  Cardiovascular: Negative for chest pain.  Neurological: Positive for focal weakness, weakness and headaches.  Psychiatric/Behavioral: Negative for confusion.  All other systems reviewed and are negative.    Physical Exam Updated Vital Signs Ht 5'  1" (1.549 m)   Physical Exam  Constitutional: She is oriented to person,  place, and time. She appears well-developed and well-nourished. No distress.  HENT:  Head: Normocephalic and atraumatic.  Mouth/Throat: Oropharynx is clear and moist.  Eyes: EOM are normal. Pupils are equal, round, and reactive to light.  Neck: Normal range of motion. Neck supple.  Cardiovascular: Normal rate and regular rhythm.  Exam reveals no gallop and no friction rub.   No murmur heard. Pulmonary/Chest: Effort normal and breath sounds normal. No respiratory distress. She has no wheezes.  Abdominal: Soft. Bowel sounds are normal. She exhibits no distension. There is no tenderness.  Musculoskeletal: Normal range of motion.  Neurological: She is alert and oriented to person, place, and time. No cranial nerve deficit. She exhibits abnormal muscle tone. Coordination normal.  There is 4+ out of 5 strength of the right upper and right lower extremity. Strength on the left is 5 out of 5.  Skin: Skin is warm and dry. She is not diaphoretic.  Nursing note and vitals reviewed.    ED Treatments / Results  Labs (all labs ordered are listed, but only abnormal results are displayed) Labs Reviewed  CBG MONITORING, ED    EKG  EKG Interpretation None       Radiology Ct Head Wo Contrast  Result Date: 08/31/2016 CLINICAL DATA:  Right arm weakness, difficulty walking. EXAM: CT HEAD WITHOUT CONTRAST TECHNIQUE: Contiguous axial images were obtained from the base of the skull through the vertex without intravenous contrast. COMPARISON:  CT scan of March 15, 2012. FINDINGS: Brain: Mild chronic ischemic white matter disease. No mass effect or midline shift is noted. Ventricular size is within normal limits. There is no evidence of mass lesion, hemorrhage or acute infarction. Vascular: No hyperdense vessel or unexpected calcification. Skull: Normal. Negative for fracture or focal lesion. Sinuses/Orbits: No acute finding. Other:  None. IMPRESSION: Mild chronic ischemic white matter disease. No acute intracranial abnormality seen. Electronically Signed   By: Marijo Conception, M.D.   On: 08/31/2016 16:41    Procedures Procedures (including critical care time)  Medications Ordered in ED Medications  acetaminophen (TYLENOL) tablet 1,000 mg (not administered)     Initial Impression / Assessment and Plan / ED Course  I have reviewed the triage vital signs and the nursing notes.  Pertinent labs & imaging results that were available during my care of the patient were reviewed by me and considered in my medical decision making (see chart for details).  Patient presents here with complaints of right arm and right leg weakness for the past 2 weeks. She does have decreased strength on exam, however MRI does not show a stroke. Laboratory studies from yesterday only significant for hyperglycemia which persists today. This seems to be her baseline and I do not feel as though intervention is necessary. She will be discharged, to follow-up with neurology if her symptoms are not improving.  Final Clinical Impressions(s) / ED Diagnoses   Final diagnoses:  None    New Prescriptions New Prescriptions   No medications on file     Veryl Speak, MD 09/01/16 1300

## 2016-09-09 ENCOUNTER — Other Ambulatory Visit: Payer: Self-pay | Admitting: Endocrinology

## 2016-10-04 ENCOUNTER — Emergency Department (HOSPITAL_COMMUNITY)
Admission: EM | Admit: 2016-10-04 | Discharge: 2016-10-05 | Disposition: A | Discharge status: Home / Self Care | Payer: Medicare Other | Attending: Emergency Medicine | Admitting: Emergency Medicine

## 2016-10-04 ENCOUNTER — Encounter (HOSPITAL_COMMUNITY): Payer: Self-pay | Admitting: *Deleted

## 2016-10-04 ENCOUNTER — Emergency Department (HOSPITAL_COMMUNITY): Payer: Medicare Other

## 2016-10-04 DIAGNOSIS — H1031 Unspecified acute conjunctivitis, right eye: Secondary | ICD-10-CM | POA: Diagnosis not present

## 2016-10-04 DIAGNOSIS — M5441 Lumbago with sciatica, right side: Secondary | ICD-10-CM | POA: Insufficient documentation

## 2016-10-04 DIAGNOSIS — Z794 Long term (current) use of insulin: Secondary | ICD-10-CM | POA: Diagnosis not present

## 2016-10-04 DIAGNOSIS — R350 Frequency of micturition: Secondary | ICD-10-CM | POA: Diagnosis not present

## 2016-10-04 DIAGNOSIS — W010XXA Fall on same level from slipping, tripping and stumbling without subsequent striking against object, initial encounter: Secondary | ICD-10-CM | POA: Diagnosis not present

## 2016-10-04 DIAGNOSIS — E119 Type 2 diabetes mellitus without complications: Secondary | ICD-10-CM | POA: Diagnosis not present

## 2016-10-04 DIAGNOSIS — Y998 Other external cause status: Secondary | ICD-10-CM | POA: Diagnosis not present

## 2016-10-04 DIAGNOSIS — Y93K9 Activity, other involving animal care: Secondary | ICD-10-CM | POA: Diagnosis not present

## 2016-10-04 DIAGNOSIS — J449 Chronic obstructive pulmonary disease, unspecified: Secondary | ICD-10-CM | POA: Diagnosis not present

## 2016-10-04 DIAGNOSIS — I2510 Atherosclerotic heart disease of native coronary artery without angina pectoris: Secondary | ICD-10-CM | POA: Insufficient documentation

## 2016-10-04 DIAGNOSIS — Y929 Unspecified place or not applicable: Secondary | ICD-10-CM | POA: Diagnosis not present

## 2016-10-04 DIAGNOSIS — Z87891 Personal history of nicotine dependence: Secondary | ICD-10-CM | POA: Insufficient documentation

## 2016-10-04 DIAGNOSIS — W19XXXA Unspecified fall, initial encounter: Secondary | ICD-10-CM

## 2016-10-04 DIAGNOSIS — M545 Low back pain: Secondary | ICD-10-CM | POA: Diagnosis present

## 2016-10-04 DIAGNOSIS — Z7902 Long term (current) use of antithrombotics/antiplatelets: Secondary | ICD-10-CM | POA: Diagnosis not present

## 2016-10-04 LAB — URINALYSIS, ROUTINE W REFLEX MICROSCOPIC
Bilirubin Urine: NEGATIVE
Glucose, UA: 50 mg/dL — AB
KETONES UR: NEGATIVE mg/dL
LEUKOCYTES UA: NEGATIVE
Nitrite: NEGATIVE
PROTEIN: 100 mg/dL — AB
Specific Gravity, Urine: 1.019 (ref 1.005–1.030)
pH: 5 (ref 5.0–8.0)

## 2016-10-04 LAB — BASIC METABOLIC PANEL
Anion gap: 16 — ABNORMAL HIGH (ref 5–15)
BUN: 21 mg/dL — AB (ref 6–20)
CO2: 20 mmol/L — ABNORMAL LOW (ref 22–32)
CREATININE: 2 mg/dL — AB (ref 0.44–1.00)
Calcium: 8.7 mg/dL — ABNORMAL LOW (ref 8.9–10.3)
Chloride: 98 mmol/L — ABNORMAL LOW (ref 101–111)
GFR calc Af Amer: 28 mL/min — ABNORMAL LOW (ref >60)
GFR, EST NON AFRICAN AMERICAN: 24 mL/min — AB (ref >60)
GLUCOSE: 225 mg/dL — AB (ref 65–99)
POTASSIUM: 3.3 mmol/L — AB (ref 3.5–5.1)
Sodium: 134 mmol/L — ABNORMAL LOW (ref 135–145)

## 2016-10-04 LAB — CBC WITH DIFFERENTIAL/PLATELET
Basophils Absolute: 0.1 10*3/uL (ref 0.0–0.1)
Basophils Relative: 1 %
EOS PCT: 2 %
Eosinophils Absolute: 0.2 10*3/uL (ref 0.0–0.7)
HCT: 37.9 % (ref 36.0–46.0)
Hemoglobin: 12.5 g/dL (ref 12.0–15.0)
LYMPHS ABS: 2.2 10*3/uL (ref 0.7–4.0)
LYMPHS PCT: 21 %
MCH: 27.8 pg (ref 26.0–34.0)
MCHC: 33 g/dL (ref 30.0–36.0)
MCV: 84.2 fL (ref 78.0–100.0)
MONO ABS: 0.4 10*3/uL (ref 0.1–1.0)
MONOS PCT: 4 %
Neutro Abs: 7.8 10*3/uL — ABNORMAL HIGH (ref 1.7–7.7)
Neutrophils Relative %: 72 %
PLATELETS: 323 10*3/uL (ref 150–400)
RBC: 4.5 MIL/uL (ref 3.87–5.11)
RDW: 14.7 % (ref 11.5–15.5)
WBC: 10.7 10*3/uL — ABNORMAL HIGH (ref 4.0–10.5)

## 2016-10-04 MED ORDER — OXYCODONE-ACETAMINOPHEN 5-325 MG PO TABS
2.0000 | ORAL_TABLET | Freq: Once | ORAL | Status: AC
  Administered 2016-10-04: 2 via ORAL
  Filled 2016-10-04 (×2): qty 2

## 2016-10-04 NOTE — ED Triage Notes (Signed)
The pt is c/o lower back paion since she fell 3 weeks ago  She reports that she has been unable to walk since this am    She is also having more pain in her rt hip

## 2016-10-04 NOTE — ED Triage Notes (Signed)
Called for triage no answer  

## 2016-10-04 NOTE — ED Provider Notes (Signed)
[facility] DEPT Provider Note   CSN: [id number] Arrival date & time: 10/04/16  1603     History   Chief Complaint Chief Complaint  Patient presents with  . Back Pain    HPI April Branch is a 71 y.o. female.  The history is provided by the patient and medical records.  Back Pain      71 year old female with history of anemia, anxiety, coronary artery disease status post CABG, COPD, depression, fibromyalgia, GERD history of TIA, hypertension, hyperlipidemia, hypothyroidism, history of prior strokes, vertigo, presenting to the ED with back and hip pain after a fall. Patient had a mechanical fall about 2 weeks ago. States she was bent over feeding her dogs when she got bumped by one of the dogs, tripped, and fell onto her back on the floor. She denies any head injury or loss of consciousness. States she was able to get up and has remained ambulatory with her cane which is baseline. Data the past 2 weeks she has had increased pain in her back and right hip. Occasionally has pain in the left hip too.  She denies any numbness or weakness of her legs. She's not had any bowel or bladder incontinence.  Past Medical History:  Diagnosis Date  . Anemia   . Anginal pain ([place])    1 MONTH AGO   . Anxiety   . Arthritis   . CAD (coronary artery disease)   . CAD (coronary artery disease) of bypass graft   . 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 Colon polyps 09/11/2010   Tubular adenoma  . COPD 12/14/2008   PATIENT DENIES    . Depression   . Diabetes mellitus age 28   insulin dependent  . Diabetic coma ([place]) Feb. 2014  . Fall at home Jan. 2, 2016  Jan. 13, 2016   Left knee  . Fibromyalgia   . GERD 07/20/2006  . GERD (gastroesophageal reflux disease)   . Headache(784.0)   . History of transient ischemic attack (TIA)   . HPV (human papilloma virus) infection   . Hyperlipidemia   . Hypertension   . Hypothyroidism   . Insulin dependent type  2 diabetes mellitus, controlled ([place])   . Lumbar disc disease   . Myocardial infarction ([place])    AGE 35    . Neuromuscular disorder ([place])    PERIPHERAL NEUROPATHY  . Peripheral vascular disease ([place])   . Personal history of malignant neoplasm of kidney(V10.52) 12/14/2008   Laparoscopic biopsy and cryoablation 7/08 Dr. Vernie Shanks   . Stroke [facility])    3 MINI STROKES  RT SIDED WEAKNESS  . Vertigo     Patient Active Problem List   Diagnosis Date Noted  . Orthostatic syncope 05/10/2016  . Colitis 05/10/2016  . Anemia 05/10/2016  . Hypomagnesemia 05/10/2016  . Food impaction of esophagus 09/02/2015  . Diabetic foot ulcer ([place]) 06/24/2015  . Atheroscler of bypass graft of left leg with intermittent claudication ([place]) 04/17/2014  . Carotid stenosis 05/19/2013  . Acute kidney injury (nontraumatic) ([place]) 09/21/2012  . Chronic pain syndrome 03/13/2012  . Acute kidney injury ([place]) 03/13/2012  . Atherosclerosis of native arteries of the extremities with intermittent claudication 10/08/2011  . PVD (peripheral vascular disease) ([place]) 07/30/2011  . CAD (coronary artery disease) 05/10/2011  . Hyperlipidemia   . Unstable angina ([place]) 05/09/2011  . CERVICAL CANCER 12/14/2008  . Personal history of malignant neoplasm of kidney(V10.52) 12/14/2008  .  COPD (chronic obstructive pulmonary disease) ([place]) 12/14/2008  . IBS 12/14/2008  . Essential hypertension   . Personal history of colonic polyps 08/13/2008  . DISORDER, TOBACCO USE 08/04/2006  . Hypothyroidism   . Insulin dependent type 2 diabetes mellitus, controlled ([place])   . Lumbar disc disease   . ANXIETY STATE NOS 07/20/2006  . GERD 07/20/2006  . HX, PERSONAL, VENOUS THROMBOSIS/EMBOLISM 07/20/2006    Past Surgical History:  Procedure Laterality Date  . ABDOMINAL AORTAGRAM N/A 08/21/2011   Procedure: ABDOMINAL Maxcine Ham;  Surgeon: Elam Dutch, MD;  Location: [facility] CATH LAB;  Service: Cardiovascular;  Laterality: N/A;  . ABDOMINAL AORTAGRAM  N/A 03/16/2014   Procedure: ABDOMINAL Maxcine Ham;  Surgeon: Elam Dutch, MD;  Location: [facility] CATH LAB;  Service: Cardiovascular;  Laterality: N/A;  . ABDOMINAL HYSTERECTOMY    . APPENDECTOMY    . BLADDER SUSPENSION    . CARDIAC CATHETERIZATION    . CHOLECYSTECTOMY OPEN    . ESOPHAGOGASTRODUODENOSCOPY (EGD) WITH PROPOFOL N/A 05/16/2014   Procedure: ESOPHAGOGASTRODUODENOSCOPY (EGD) WITH PROPOFOL with Balloon dilation;  Surgeon: Milus Banister, MD;  Location: [facility];  Service: Endoscopy;  Laterality: N/A;  . ESOPHAGOGASTRODUODENOSCOPY (EGD) WITH PROPOFOL N/A 09/02/2015   Procedure: ESOPHAGOGASTRODUODENOSCOPY (EGD) WITH PROPOFOL;  Surgeon: Doran Stabler, MD;  Location: [facility];  Service: Gastroenterology;  Laterality: N/A;  . FEMORAL-POPLITEAL BYPASS GRAFT  10/12/2011   Procedure: BYPASS GRAFT FEMORAL-POPLITEAL ARTERY;  Surgeon: Elam Dutch, MD;  Location: [facility] OR;  Service: Vascular;  Laterality: Left;  Left Femoral-Popliteal Bypass Graft using 33mm x 80cm Propaten Graft with intraop arteriogram times one.  . FEMORAL-POPLITEAL BYPASS GRAFT Left 04/17/2014   Procedure: REDO LEFT FEMORAL-POPLITEAL ARTERY BYPASS USING GORE PROPATEN 14mmx80cm GRAFT;  Surgeon: Elam Dutch, MD;  Location: [facility];  Service: Vascular;  Laterality: Left;  . FOOT SURGERY Left    "bone spurs"  . INTRAOPERATIVE ARTERIOGRAM  10/12/2011   Procedure: INTRA OPERATIVE ARTERIOGRAM;  Surgeon: Elam Dutch, MD;  Location: [facility];  Service: Vascular;  Laterality: Left;  . KNEE ARTHROSCOPY Bilateral    "cartilage"  . Laparascopic cryoablation of left kidney  08/2006   Dr. Gladis Riffle for renal cell cancer  . LEFT HEART CATHETERIZATION WITH CORONARY ANGIOGRAM N/A 05/11/2011   Procedure: LEFT HEART CATHETERIZATION WITH CORONARY ANGIOGRAM;  Surgeon: Jolaine Artist, MD;  Location: [facility] CATH LAB;  Service: Cardiovascular;  Laterality: N/A;  . LUNG SURGERY Right    lung nodule removed from the right side  . PR VEIN  BYPASS GRAFT,AORTO-FEM-POP  05/27/2010  . SHOULDER ARTHROSCOPY Bilateral    'Spurs"  . TUBAL LIGATION      OB History    No data available       Home Medications    Prior to Admission medications   Medication Sig Start Date End Date Taking? Authorizing Provider  ACCU-CHEK FASTCLIX LANCETS MISC Use 4 per day to check blood sugar dx code E11.9 11/29/14   Elayne Snare, MD  atenolol (TENORMIN) 25 MG tablet Take 50 mg by mouth daily.    [provider]  azithromycin (ZITHROMAX) 500 MG tablet Take 1 tablet (500 mg total) by mouth daily. 05/12/16   Geradine Girt, DO  cilostazol (PLETAL) 100 MG tablet Take 1 tablet (100 mg total) by mouth 2 (two) times daily before a meal. 03/17/16   Elam Dutch, MD  clopidogrel (PLAVIX) 75 MG tablet Take 75 mg by mouth daily.  04/02/14   [provider]  DULoxetine (CYMBALTA)  60 MG capsule Take 1 capsule (60 mg total) by mouth daily. 03/19/16   Elayne Snare, MD  ferrous sulfate 325 (65 FE) MG tablet Take 325 mg by mouth 2 (two) times daily with a meal.    [provider]  gabapentin (NEURONTIN) 100 MG capsule Take 200 mg by mouth at bedtime.  01/07/15   [provider]  HYDROcodone-acetaminophen (NORCO) 10-325 MG tablet Take 1 tablet by mouth every 4 (four) hours as needed for moderate pain or severe pain. 06/28/15   Elwin Mocha, MD  hydroxypropyl methylcellulose / hypromellose (ISOPTO TEARS / GONIOVISC) 2.5 % ophthalmic solution Place 1 drop into both eyes 4 (four) times daily as needed for dry eyes.    [provider]  Insulin Glargine (TOUJEO SOLOSTAR) 300 UNIT/ML SOPN Inject 60 Units into the skin daily. 05/11/16   Geradine Girt, DO  insulin lispro (HUMALOG KWIKPEN) 100 UNIT/ML KiwkPen INJECT 10 UNITS 3 TIMES A DAY AS NEEDED FOR BLOOD SUGAR 05/11/16   Eulogio Bear U, DO  Insulin Pen Needle (BD PEN NEEDLE NANO U/F) 32G X 4 MM MISC Use to inject insulin 10/30/14   Elayne Snare, MD  lansoprazole (PREVACID) 30 MG  capsule Take 1 capsule (30 mg total) by mouth daily. 05/11/16   Geradine Girt, DO  levothyroxine (SYNTHROID, LEVOTHROID) 137 MCG tablet TAKE 1 TABLET (137 MCG TOTAL) BY MOUTH DAILY BEFORE BREAKFAST. 02/04/16   Elayne Snare, MD  lisinopril (PRINIVIL,ZESTRIL) 5 MG tablet TAKE 1 TABLET (5 MG TOTAL) BY MOUTH DAILY. 04/22/15   Elayne Snare, MD  LORazepam (ATIVAN) 1 MG tablet Take 1 tablet (1 mg total) by mouth 3 times/day as needed-between meals & bedtime for anxiety. 06/28/15   Elwin Mocha, MD  metFORMIN (GLUCOPHAGE-XR) 750 MG 24 hr tablet TAKE 2 TABLETS BY MOUTH EVERY DAY 09/09/16   Elayne Snare, MD  nortriptyline (PAMELOR) 75 MG capsule Take 75 mg by mouth at bedtime. 01/24/14   [provider]  potassium chloride (KLOR-CON 10) 10 MEQ tablet Take 1 tablet (10 mEq total) by mouth daily. 03/19/16   Elayne Snare, MD  pravastatin (PRAVACHOL) 20 MG tablet Take 20 mg by mouth daily.    [provider]  tolterodine (DETROL LA) 4 MG 24 hr capsule Take 4 mg by mouth daily.     [provider]    Family History Family History  Problem Relation Age of Onset  . Heart disease Mother   . Lung cancer Mother   . Cancer Mother        Lung  . Heart disease Father   . Lung cancer Father   . Cancer Father        Lung  . Heart disease Sister        CABG- Open Heart    Social History Social History  Substance Use Topics  . Smoking status: Former Smoker    Packs/day: 0.25    Years: 50.00    Types: Cigarettes    Quit date: 06/24/2014  . Smokeless tobacco: Never Used  . Alcohol use No     Allergies   Patient has no known allergies.   Review of Systems Review of Systems  Musculoskeletal: Positive for arthralgias and back pain.  All other systems reviewed and are negative.    Physical Exam Updated Vital Signs BP (!) 160/80 (BP Location: Right Arm)   Pulse 88   Temp 98 F (36.7 C) (Oral)   Resp 16   Ht 5\' 1"  (1.549  m)   Wt 67.6 kg (149 lb)   SpO2 99%   BMI 28.15  kg/m   Physical Exam  Constitutional: She is oriented to person, place, and time. She appears well-developed and well-nourished.  HENT:  Head: Normocephalic and atraumatic.  Mouth/Throat: Oropharynx is clear and moist.  Eyes: Pupils are equal, round, and reactive to light. Conjunctivae and EOM are normal.  Neck: Normal range of motion.  Cardiovascular: Normal rate, regular rhythm and normal heart sounds.   Pulmonary/Chest: Effort normal and breath sounds normal. No respiratory distress. She has no wheezes.  Abdominal: Soft. Bowel sounds are normal. There is no tenderness. There is no rebound.  Musculoskeletal: Normal range of motion.  Tenderness of midline lumbar spine, abrasion noted to left paraspinal region; + SLR on right, - SLR on left; pelvis seems stable; no significant tenderness of the hips; no gross deformities; no leg shortening, DP pulses intact bilaterally  Neurological: She is alert and oriented to person, place, and time.  Skin: Skin is warm and dry.  Psychiatric: She has a normal mood and affect.  Nursing note and vitals reviewed.    ED Treatments / Results  Labs (all labs ordered are listed, but only abnormal results are displayed) Labs Reviewed  URINALYSIS, ROUTINE W REFLEX MICROSCOPIC - Abnormal; Notable for the following:       Result Value   APPearance CLOUDY (*)    Glucose, UA 50 (*)    Hgb urine dipstick SMALL (*)    Protein, ur 100 (*)    Bacteria, UA FEW (*)    Squamous Epithelial / LPF 6-30 (*)    All other components within normal limits  CBC WITH DIFFERENTIAL/PLATELET - Abnormal; Notable for the following:    WBC 10.7 (*)    Neutro Abs 7.8 (*)    All other components within normal limits  BASIC METABOLIC PANEL - Abnormal; Notable for the following:    Sodium 134 (*)    Potassium 3.3 (*)    Chloride 98 (*)    CO2 20 (*)    Glucose, Bld 225 (*)    BUN 21 (*)    Creatinine, Ser 2.00 (*)    Calcium 8.7 (*)    GFR calc non Af Amer 24 (*)    GFR  calc Af Amer 28 (*)    Anion gap 16 (*)    All other components within normal limits  URINE CULTURE    EKG  EKG Interpretation None       Radiology Dg Lumbar Spine Complete  Result Date: 10/04/2016 CLINICAL DATA:  Initial evaluation for recent fall 2 weeks ago, increased back pain and hip pain. EXAM: LUMBAR SPINE - COMPLETE 4+ VIEW COMPARISON:  Prior CT from 09/09/2013. FINDINGS: Five non rib-bearing lumbar type vertebral bodies. Mild dextroscoliosis. Vertebral bodies otherwise normally aligned with preservation of the normal lumbar lordosis. Vertebral body heights maintained. No acute fracture or subluxation. Visualized sacrum intact. Mild multilevel degenerative spondylolysis within the thorax lumbar spine, greatest at T12-L1. Prominent aortic atherosclerosis.  Cholecystectomy clips. IMPRESSION: 1. No radiographic evidence for acute abnormality within the lumbar spine. 2. Mild dextroscoliosis with multilevel degenerative spondylolysis, greatest at T12-L1. 3. Extensive aortic atherosclerosis. Electronically Signed   By: Jeannine Boga M.D.   On: 10/04/2016 19:18   Dg Hips Bilat With Pelvis 2v  Result Date: 10/04/2016 CLINICAL DATA:  Initial evaluation for recent fall 2 weeks ago, increase back and left hip pain. EXAM: DG HIP (WITH OR WITHOUT PELVIS) 2V BILAT  COMPARISON:  None. FINDINGS: No acute fracture or dislocation about either hip. Femoral heads in normal alignment with the acetabula. Femoral head heights preserved. Bony pelvis intact. SI joints approximated and symmetric. Degenerative osteoarthritic changes present about the hips. Surgical clips overlie the left inguinal region. No acute soft tissue abnormality. IMPRESSION: 1. No acute osseous abnormality about the hips or pelvis. 2. 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 Electronically Signed   By: Jeannine Boga M.D.   On: 10/04/2016 19:22    Procedures Procedures (including critical care  time)  Medications Ordered in ED Medications  oxyCODONE-acetaminophen (PERCOCET/ROXICET) 5-325 MG per tablet 2 tablet (not administered)     Initial Impression / Assessment and Plan / ED Course  I have reviewed the triage vital signs and the nursing notes.  Pertinent labs & imaging results that were available during my care of the patient were reviewed by me and considered in my medical decision making (see chart for details).  71 year old female here with low back pain and hip pain after mechanical fall 2 weeks ago. Was feeding her dogs and tripped and fell onto her back. No head injury or loss of consciousness. Reports worsening pain in her low back and right hip. Occasional pain in the left hip as well. No gross deformities or leg shortening noted on exam. She does have some abrasions to her left lumbar paraspinal region. We'll plan for screening x-rays.  Screening x-rays negative for acute findings. Patient has been evaluated by attending physician, Dr. Lita Mains-- agrees likely MSK from fall, but patient is now complaining of some urinary frequency.  Will add basic labs, UA.  If reassuring, can likely d/c home.  11:48 PM Patients labs With slight bump in her creatinine, not far from baseline.  UA appears contaminated with few bacteria and 6-30 squamous cells. We'll send for culture. Patient now resting comfortably on repeat assessment. Feel she is stable for discharge home. Will have her follow-up closely with her primary care doctor for follow-up and recheck of creatinine.  Encouraged oral hydration.  Discussed plan with patient, she acknowledged understanding and agreed with plan of care.  Return precautions given for new or worsening symptoms.  12:41 AM At time of discharge, patient requesting drops for her right eye.  Reports hx of conjunctivitis a few months ago and feels she has it again.  Right conjunctiva is mildly injected, some mucous appearing material in the medial and lateral  canthus.  No lid edema or erythema.  Denies vision changes.  Does not wear contact lenses. Will start on Polytrim drops. Follow-up with PCP for this as well.  Final Clinical Impressions(s) / ED Diagnoses   Final diagnoses:  Fall, initial encounter  Low back pain with right-sided sciatica, unspecified back pain laterality, unspecified chronicity    New Prescriptions New Prescriptions   No medications on file     Larene Pickett, PA-C 10/05/16 0031    Larene Pickett, PA-C 10/05/16 0044    Julianne Rice, MD 10/16/16 [phone]

## 2016-10-04 NOTE — Discharge Instructions (Signed)
Your x-rays today were normal.  Labs were overall reassuring.  Your kidney function was slightly increased from earlier in the month.  Recommend to make sure to drink plenty of fluids over the next few days. Follow-up with your primary care doctor.  Recommend to have your kidney function rechecked. You can return here for any new/worsening symptoms.

## 2016-10-04 NOTE — ED Notes (Signed)
Pt out of room.

## 2016-10-04 NOTE — ED Notes (Signed)
Patient transported to X-ray 

## 2016-10-05 MED ORDER — POLYMYXIN B-TRIMETHOPRIM 10000-0.1 UNIT/ML-% OP SOLN: 1.0000 [drp] | OPHTHALMIC | 0 refills | Status: DC

## 2016-10-05 NOTE — ED Notes (Signed)
The pt has been trying to call her family but that has been unsuccessful

## 2016-10-05 NOTE — ED Notes (Signed)
The pt is attempting to call for a ride  Her son left for work

## 2016-10-06 LAB — URINE CULTURE

## 2016-10-09 MED ORDER — MIDAZOLAM HCL 2 MG/2ML IJ SOLN
INTRAMUSCULAR | Status: AC
  Filled 2016-10-09: qty 6

## 2016-10-12 ENCOUNTER — Inpatient Hospital Stay (HOSPITAL_COMMUNITY)
Admission: EM | Admit: 2016-10-12 | Discharge: 2016-10-15 | DRG: 645 | Disposition: A | Discharge status: Home / Self Care | Payer: Medicare Other | Attending: Family Medicine | Admitting: Family Medicine

## 2016-10-12 ENCOUNTER — Emergency Department (HOSPITAL_COMMUNITY): Payer: Medicare Other

## 2016-10-12 ENCOUNTER — Encounter (HOSPITAL_COMMUNITY): Payer: Self-pay | Admitting: Nurse Practitioner

## 2016-10-12 DIAGNOSIS — M1990 Unspecified osteoarthritis, unspecified site: Secondary | ICD-10-CM | POA: Diagnosis present

## 2016-10-12 DIAGNOSIS — F17210 Nicotine dependence, cigarettes, uncomplicated: Secondary | ICD-10-CM | POA: Diagnosis present

## 2016-10-12 DIAGNOSIS — R001 Bradycardia, unspecified: Secondary | ICD-10-CM | POA: Diagnosis present

## 2016-10-12 DIAGNOSIS — Z794 Long term (current) use of insulin: Secondary | ICD-10-CM | POA: Diagnosis not present

## 2016-10-12 DIAGNOSIS — I252 Old myocardial infarction: Secondary | ICD-10-CM

## 2016-10-12 DIAGNOSIS — T447X5A Adverse effect of beta-adrenoreceptor antagonists, initial encounter: Secondary | ICD-10-CM | POA: Diagnosis present

## 2016-10-12 DIAGNOSIS — Z8673 Personal history of transient ischemic attack (TIA), and cerebral infarction without residual deficits: Secondary | ICD-10-CM

## 2016-10-12 DIAGNOSIS — R296 Repeated falls: Secondary | ICD-10-CM | POA: Diagnosis present

## 2016-10-12 DIAGNOSIS — E1151 Type 2 diabetes mellitus with diabetic peripheral angiopathy without gangrene: Secondary | ICD-10-CM | POA: Diagnosis present

## 2016-10-12 DIAGNOSIS — E1122 Type 2 diabetes mellitus with diabetic chronic kidney disease: Secondary | ICD-10-CM | POA: Diagnosis present

## 2016-10-12 DIAGNOSIS — J438 Other emphysema: Secondary | ICD-10-CM | POA: Diagnosis not present

## 2016-10-12 DIAGNOSIS — N183 Chronic kidney disease, stage 3 unspecified: Secondary | ICD-10-CM | POA: Diagnosis present

## 2016-10-12 DIAGNOSIS — G894 Chronic pain syndrome: Secondary | ICD-10-CM | POA: Diagnosis present

## 2016-10-12 DIAGNOSIS — D509 Iron deficiency anemia, unspecified: Secondary | ICD-10-CM | POA: Diagnosis present

## 2016-10-12 DIAGNOSIS — I2583 Coronary atherosclerosis due to lipid rich plaque: Secondary | ICD-10-CM | POA: Diagnosis not present

## 2016-10-12 DIAGNOSIS — M5136 Other intervertebral disc degeneration, lumbar region: Secondary | ICD-10-CM | POA: Diagnosis present

## 2016-10-12 DIAGNOSIS — M797 Fibromyalgia: Secondary | ICD-10-CM | POA: Diagnosis present

## 2016-10-12 DIAGNOSIS — I10 Essential (primary) hypertension: Secondary | ICD-10-CM | POA: Diagnosis present

## 2016-10-12 DIAGNOSIS — I739 Peripheral vascular disease, unspecified: Secondary | ICD-10-CM | POA: Diagnosis present

## 2016-10-12 DIAGNOSIS — E039 Hypothyroidism, unspecified: Secondary | ICD-10-CM | POA: Diagnosis not present

## 2016-10-12 DIAGNOSIS — R51 Headache: Secondary | ICD-10-CM | POA: Diagnosis present

## 2016-10-12 DIAGNOSIS — E11649 Type 2 diabetes mellitus with hypoglycemia without coma: Secondary | ICD-10-CM | POA: Diagnosis present

## 2016-10-12 DIAGNOSIS — E1142 Type 2 diabetes mellitus with diabetic polyneuropathy: Secondary | ICD-10-CM | POA: Diagnosis present

## 2016-10-12 DIAGNOSIS — I4510 Unspecified right bundle-branch block: Secondary | ICD-10-CM | POA: Diagnosis present

## 2016-10-12 DIAGNOSIS — J449 Chronic obstructive pulmonary disease, unspecified: Secondary | ICD-10-CM | POA: Diagnosis present

## 2016-10-12 DIAGNOSIS — T380X5A Adverse effect of glucocorticoids and synthetic analogues, initial encounter: Secondary | ICD-10-CM | POA: Diagnosis not present

## 2016-10-12 DIAGNOSIS — R29818 Other symptoms and signs involving the nervous system: Secondary | ICD-10-CM | POA: Diagnosis present

## 2016-10-12 DIAGNOSIS — Z7902 Long term (current) use of antithrombotics/antiplatelets: Secondary | ICD-10-CM

## 2016-10-12 DIAGNOSIS — I129 Hypertensive chronic kidney disease with stage 1 through stage 4 chronic kidney disease, or unspecified chronic kidney disease: Secondary | ICD-10-CM | POA: Diagnosis present

## 2016-10-12 DIAGNOSIS — Z85528 Personal history of other malignant neoplasm of kidney: Secondary | ICD-10-CM

## 2016-10-12 DIAGNOSIS — Z6832 Body mass index (BMI) 32.0-32.9, adult: Secondary | ICD-10-CM

## 2016-10-12 DIAGNOSIS — E669 Obesity, unspecified: Secondary | ICD-10-CM | POA: Diagnosis present

## 2016-10-12 DIAGNOSIS — E785 Hyperlipidemia, unspecified: Secondary | ICD-10-CM | POA: Diagnosis present

## 2016-10-12 DIAGNOSIS — E119 Type 2 diabetes mellitus without complications: Secondary | ICD-10-CM

## 2016-10-12 DIAGNOSIS — T381X6A Underdosing of thyroid hormones and substitutes, initial encounter: Secondary | ICD-10-CM | POA: Diagnosis present

## 2016-10-12 DIAGNOSIS — R531 Weakness: Secondary | ICD-10-CM | POA: Diagnosis not present

## 2016-10-12 DIAGNOSIS — Y92009 Unspecified place in unspecified non-institutional (private) residence as the place of occurrence of the external cause: Secondary | ICD-10-CM

## 2016-10-12 DIAGNOSIS — D649 Anemia, unspecified: Secondary | ICD-10-CM | POA: Diagnosis not present

## 2016-10-12 DIAGNOSIS — E1165 Type 2 diabetes mellitus with hyperglycemia: Secondary | ICD-10-CM | POA: Diagnosis present

## 2016-10-12 DIAGNOSIS — Z79899 Other long term (current) drug therapy: Secondary | ICD-10-CM

## 2016-10-12 DIAGNOSIS — R7989 Other specified abnormal findings of blood chemistry: Secondary | ICD-10-CM | POA: Diagnosis present

## 2016-10-12 DIAGNOSIS — I517 Cardiomegaly: Secondary | ICD-10-CM | POA: Diagnosis present

## 2016-10-12 DIAGNOSIS — S0990XA Unspecified injury of head, initial encounter: Secondary | ICD-10-CM | POA: Diagnosis present

## 2016-10-12 DIAGNOSIS — F419 Anxiety disorder, unspecified: Secondary | ICD-10-CM | POA: Diagnosis present

## 2016-10-12 DIAGNOSIS — I2510 Atherosclerotic heart disease of native coronary artery without angina pectoris: Secondary | ICD-10-CM | POA: Diagnosis not present

## 2016-10-12 DIAGNOSIS — K219 Gastro-esophageal reflux disease without esophagitis: Secondary | ICD-10-CM | POA: Diagnosis present

## 2016-10-12 DIAGNOSIS — Z95828 Presence of other vascular implants and grafts: Secondary | ICD-10-CM

## 2016-10-12 DIAGNOSIS — Y92230 Patient room in hospital as the place of occurrence of the external cause: Secondary | ICD-10-CM | POA: Diagnosis not present

## 2016-10-12 DIAGNOSIS — R778 Other specified abnormalities of plasma proteins: Secondary | ICD-10-CM

## 2016-10-12 DIAGNOSIS — W19XXXA Unspecified fall, initial encounter: Secondary | ICD-10-CM | POA: Diagnosis present

## 2016-10-12 DIAGNOSIS — R079 Chest pain, unspecified: Secondary | ICD-10-CM | POA: Diagnosis present

## 2016-10-12 DIAGNOSIS — F329 Major depressive disorder, single episode, unspecified: Secondary | ICD-10-CM | POA: Diagnosis present

## 2016-10-12 DIAGNOSIS — T383X6A Underdosing of insulin and oral hypoglycemic [antidiabetic] drugs, initial encounter: Secondary | ICD-10-CM | POA: Diagnosis present

## 2016-10-12 LAB — BASIC METABOLIC PANEL
ANION GAP: 9 (ref 5–15)
BUN: 16 mg/dL (ref 6–20)
CALCIUM: 8.7 mg/dL — AB (ref 8.9–10.3)
CO2: 27 mmol/L (ref 22–32)
CREATININE: 1.99 mg/dL — AB (ref 0.44–1.00)
Chloride: 98 mmol/L — ABNORMAL LOW (ref 101–111)
GFR, EST AFRICAN AMERICAN: 28 mL/min — AB (ref >60)
GFR, EST NON AFRICAN AMERICAN: 24 mL/min — AB (ref >60)
Glucose, Bld: 331 mg/dL — ABNORMAL HIGH (ref 65–99)
Potassium: 3.5 mmol/L (ref 3.5–5.1)
SODIUM: 134 mmol/L — AB (ref 135–145)

## 2016-10-12 LAB — TROPONIN I

## 2016-10-12 LAB — BRAIN NATRIURETIC PEPTIDE: B NATRIURETIC PEPTIDE 5: 53.4 pg/mL (ref 0.0–100.0)

## 2016-10-12 LAB — CBC
HCT: 33 % — ABNORMAL LOW (ref 36.0–46.0)
HEMOGLOBIN: 10.9 g/dL — AB (ref 12.0–15.0)
MCH: 27.9 pg (ref 26.0–34.0)
MCHC: 33 g/dL (ref 30.0–36.0)
MCV: 84.6 fL (ref 78.0–100.0)
PLATELETS: 315 10*3/uL (ref 150–400)
RBC: 3.9 MIL/uL (ref 3.87–5.11)
RDW: 14.8 % (ref 11.5–15.5)
WBC: 9 10*3/uL (ref 4.0–10.5)

## 2016-10-12 MED ORDER — LEVOTHYROXINE SODIUM 25 MCG PO TABS
137.0000 ug | ORAL_TABLET | Freq: Every day | ORAL | Status: DC
  Administered 2016-10-13 – 2016-10-15 (×4): 137 ug via ORAL
  Filled 2016-10-12 (×6): qty 1

## 2016-10-12 MED ORDER — ONDANSETRON HCL 4 MG PO TABS: 4.0000 mg | ORAL_TABLET | Freq: Four times a day (QID) | ORAL | Status: DC | PRN

## 2016-10-12 MED ORDER — SODIUM CHLORIDE 0.9% FLUSH
3.0000 mL | Freq: Two times a day (BID) | INTRAVENOUS | Status: DC
  Administered 2016-10-13 – 2016-10-15 (×5): 3 mL via INTRAVENOUS

## 2016-10-12 MED ORDER — CILOSTAZOL 100 MG PO TABS
100.0000 mg | ORAL_TABLET | Freq: Two times a day (BID) | ORAL | Status: DC
  Administered 2016-10-13 – 2016-10-15 (×5): 100 mg via ORAL
  Filled 2016-10-12 (×7): qty 1

## 2016-10-12 MED ORDER — POTASSIUM CHLORIDE ER 10 MEQ PO TBCR
10.0000 meq | EXTENDED_RELEASE_TABLET | Freq: Every day | ORAL | Status: DC
  Administered 2016-10-13 – 2016-10-15 (×3): 10 meq via ORAL
  Filled 2016-10-12 (×5): qty 1

## 2016-10-12 MED ORDER — ACETAMINOPHEN 650 MG RE SUPP
650.0000 mg | Freq: Four times a day (QID) | RECTAL | Status: DC | PRN
Start: 2016-10-12 — End: 2016-10-15

## 2016-10-12 MED ORDER — HYPROMELLOSE (GONIOSCOPIC) 2.5 % OP SOLN: 1.0000 [drp] | Freq: Four times a day (QID) | OPHTHALMIC | Status: DC | PRN

## 2016-10-12 MED ORDER — CLOPIDOGREL BISULFATE 75 MG PO TABS
75.0000 mg | ORAL_TABLET | Freq: Every day | ORAL | Status: DC
Start: 2016-10-13 — End: 2016-10-15
  Administered 2016-10-13 – 2016-10-15 (×3): 75 mg via ORAL
  Filled 2016-10-12 (×3): qty 1

## 2016-10-12 MED ORDER — HYDRALAZINE HCL 20 MG/ML IJ SOLN
10.0000 mg | INTRAMUSCULAR | Status: DC | PRN
  Administered 2016-10-13: 10 mg via INTRAVENOUS
  Filled 2016-10-12: qty 1

## 2016-10-12 MED ORDER — FERROUS SULFATE 325 (65 FE) MG PO TABS
325.0000 mg | ORAL_TABLET | Freq: Two times a day (BID) | ORAL | Status: DC
  Administered 2016-10-13 – 2016-10-15 (×5): 325 mg via ORAL
  Filled 2016-10-12 (×6): qty 1

## 2016-10-12 MED ORDER — PREDNISONE 20 MG PO TABS
60.0000 mg | ORAL_TABLET | Freq: Once | ORAL | Status: AC
  Administered 2016-10-12: 60 mg via ORAL
  Filled 2016-10-12: qty 3

## 2016-10-12 MED ORDER — INSULIN GLARGINE 100 UNIT/ML ~~LOC~~ SOLN
50.0000 [IU] | Freq: Every day | SUBCUTANEOUS | Status: DC
  Administered 2016-10-13 – 2016-10-14 (×2): 50 [IU] via SUBCUTANEOUS
  Filled 2016-10-12 (×4): qty 0.5

## 2016-10-12 MED ORDER — PRAVASTATIN SODIUM 20 MG PO TABS
20.0000 mg | ORAL_TABLET | Freq: Every day | ORAL | Status: DC
  Administered 2016-10-13 – 2016-10-15 (×3): 20 mg via ORAL
  Filled 2016-10-12 (×3): qty 1

## 2016-10-12 MED ORDER — ACETAMINOPHEN 325 MG PO TABS
650.0000 mg | ORAL_TABLET | Freq: Four times a day (QID) | ORAL | Status: DC | PRN
  Administered 2016-10-15: 650 mg via ORAL
  Filled 2016-10-12: qty 2

## 2016-10-12 MED ORDER — SODIUM CHLORIDE 0.9% FLUSH
3.0000 mL | Freq: Two times a day (BID) | INTRAVENOUS | Status: DC
  Administered 2016-10-13 – 2016-10-15 (×3): 3 mL via INTRAVENOUS

## 2016-10-12 MED ORDER — HYDROCODONE-ACETAMINOPHEN 10-325 MG PO TABS
1.0000 | ORAL_TABLET | ORAL | Status: DC | PRN
  Administered 2016-10-13 – 2016-10-15 (×3): 1 via ORAL
  Filled 2016-10-12 (×3): qty 1

## 2016-10-12 MED ORDER — INSULIN ASPART 100 UNIT/ML ~~LOC~~ SOLN
0.0000 [IU] | Freq: Every day | SUBCUTANEOUS | Status: DC
  Administered 2016-10-13: 5 [IU] via SUBCUTANEOUS
  Administered 2016-10-14: 2 [IU] via SUBCUTANEOUS
  Filled 2016-10-12: qty 1

## 2016-10-12 MED ORDER — SODIUM CHLORIDE 0.9 % IV SOLN: 250.0000 mL | INTRAVENOUS | Status: DC | PRN

## 2016-10-12 MED ORDER — HEPARIN SODIUM (PORCINE) 5000 UNIT/ML IJ SOLN
5000.0000 [IU] | Freq: Three times a day (TID) | INTRAMUSCULAR | Status: DC
  Administered 2016-10-13 – 2016-10-15 (×7): 5000 [IU] via SUBCUTANEOUS
  Filled 2016-10-12 (×7): qty 1

## 2016-10-12 MED ORDER — FESOTERODINE FUMARATE ER 8 MG PO TB24
8.0000 mg | ORAL_TABLET | Freq: Every day | ORAL | Status: DC
  Administered 2016-10-13 – 2016-10-15 (×3): 8 mg via ORAL
  Filled 2016-10-12 (×3): qty 1

## 2016-10-12 MED ORDER — SENNOSIDES-DOCUSATE SODIUM 8.6-50 MG PO TABS
1.0000 | ORAL_TABLET | Freq: Every evening | ORAL | Status: DC | PRN
  Filled 2016-10-12: qty 1

## 2016-10-12 MED ORDER — POLYMYXIN B-TRIMETHOPRIM 10000-0.1 UNIT/ML-% OP SOLN: 1.0000 [drp] | OPHTHALMIC | Status: DC

## 2016-10-12 MED ORDER — INSULIN ASPART 100 UNIT/ML ~~LOC~~ SOLN: 3.0000 [IU] | Freq: Three times a day (TID) | SUBCUTANEOUS | Status: DC

## 2016-10-12 MED ORDER — ONDANSETRON HCL 4 MG/2ML IJ SOLN
4.0000 mg | Freq: Four times a day (QID) | INTRAMUSCULAR | Status: DC | PRN
Start: 2016-10-12 — End: 2016-10-15

## 2016-10-12 MED ORDER — QUETIAPINE FUMARATE 100 MG PO TABS
100.0000 mg | ORAL_TABLET | Freq: Every day | ORAL | Status: DC
  Administered 2016-10-13 – 2016-10-14 (×3): 100 mg via ORAL
  Filled 2016-10-12 (×3): qty 1

## 2016-10-12 MED ORDER — SODIUM CHLORIDE 0.9% FLUSH: 3.0000 mL | INTRAVENOUS | Status: DC | PRN

## 2016-10-12 MED ORDER — NORTRIPTYLINE HCL 25 MG PO CAPS
75.0000 mg | ORAL_CAPSULE | Freq: Every day | ORAL | Status: DC
  Administered 2016-10-13 – 2016-10-14 (×3): 75 mg via ORAL
  Filled 2016-10-12 (×4): qty 3

## 2016-10-12 MED ORDER — INSULIN ASPART 100 UNIT/ML ~~LOC~~ SOLN
0.0000 [IU] | Freq: Three times a day (TID) | SUBCUTANEOUS | Status: DC
  Administered 2016-10-13: 20 [IU] via SUBCUTANEOUS
  Administered 2016-10-13: 4 [IU] via SUBCUTANEOUS
  Administered 2016-10-14: 3 [IU] via SUBCUTANEOUS

## 2016-10-12 MED ORDER — BISACODYL 5 MG PO TBEC
5.0000 mg | DELAYED_RELEASE_TABLET | Freq: Every day | ORAL | Status: DC | PRN
  Filled 2016-10-12: qty 1

## 2016-10-12 MED ORDER — PANTOPRAZOLE SODIUM 20 MG PO TBEC
20.0000 mg | DELAYED_RELEASE_TABLET | Freq: Every day | ORAL | Status: DC
  Administered 2016-10-13 – 2016-10-15 (×3): 20 mg via ORAL
  Filled 2016-10-12 (×3): qty 1

## 2016-10-12 MED ORDER — DULOXETINE HCL 60 MG PO CPEP
60.0000 mg | ORAL_CAPSULE | Freq: Every day | ORAL | Status: DC
  Administered 2016-10-13 – 2016-10-15 (×3): 60 mg via ORAL
  Filled 2016-10-12 (×4): qty 1

## 2016-10-12 MED ORDER — GABAPENTIN 100 MG PO CAPS
200.0000 mg | ORAL_CAPSULE | Freq: Every day | ORAL | Status: DC
  Administered 2016-10-13 – 2016-10-14 (×3): 200 mg via ORAL
  Filled 2016-10-12 (×2): qty 2

## 2016-10-12 MED ORDER — LORAZEPAM 1 MG PO TABS
1.0000 mg | ORAL_TABLET | Freq: Two times a day (BID) | ORAL | Status: DC | PRN
  Administered 2016-10-13: 1 mg via ORAL
  Filled 2016-10-12: qty 1

## 2016-10-12 MED ORDER — ALBUTEROL SULFATE (2.5 MG/3ML) 0.083% IN NEBU
5.0000 mg | INHALATION_SOLUTION | Freq: Once | RESPIRATORY_TRACT | Status: AC
  Administered 2016-10-12: 5 mg via RESPIRATORY_TRACT
  Filled 2016-10-12: qty 6

## 2016-10-12 NOTE — H&P (Signed)
History and Physical    BRIENNE LIGUORI FUX:[id number] DOB: 1945/11/05 DOA: 10/12/2016  PCP: [name], MD   Patient coming from: Home   Chief Complaint: Gen weakness   HPI: April Branch is a 71 y.o. female with medical history significant for coronary artery disease, chronic kidney disease stage III-IV, chronic pain, hypothyroidism, hypertension, and insulin-dependent diabetes mellitus, now presenting to the emergency department with generalized weakness and falls. Patient reports that she has been generally weak, but mainly in her bilateral legs, for the past 2 weeks. She reports suffering multiple falls at home secondary to her weakness and reports a headache from a fall yesterday with a head strike. She denies any change in vision or hearing and denies any focal numbness or weakness. No recent fevers or chills, no chest pain or palpitations, no significant cough or dyspnea. Patient has been evaluated in the emergency department on multiple occasions with similar complaints, but no definite etiology has been identified. Denies chest pain, confusion, or focal weakness or numbness. No change in vision or hearing.   ED Course: Upon arrival to the ED, patient is found to be afebrile, saturating well on room air, bradycardic in the low to mid 40s, and with stable blood pressure. EKG features a bradycardia with rate 44 and incomplete right bundle branch block. Chest x-ray is negative for acute cardiopulmonary disease. Noncontrast head CT is negative for acute intracranial abnormality. Chemistry panel is notable for sodium 134, and creatinine of 1.99, similar to recent prior, but up from 1.5 last month. CBC is notable for a chronic normocytic anemia with hemoglobin of 10.9. Troponin is undetectable. Cardiology was consulted by the physician and advised for medical admission to observe the patient taper her beta blocker as needed. She remained bradycardic in the ED with stable blood pressure and  will be observed on the telemetry unit for ongoing evaluation and management ofgeneralized weakness with falls, possibly secondary to bradycardia.   Review of Systems:  All other systems reviewed and apart from HPI, are negative.  Past Medical History:  Diagnosis Date  . Anemia   . Anginal pain ([place])    1 MONTH AGO   . Anxiety   . Arthritis   . CAD (coronary artery disease)   . CAD (coronary artery disease) of bypass graft   . 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 Colon polyps 09/11/2010   Tubular adenoma  . COPD 12/14/2008   PATIENT DENIES    . Depression   . Diabetes mellitus age 89   insulin dependent  . Diabetic coma ([place]) Feb. 2014  . Fall at home Jan. 2, 2016  Jan. 13, 2016   Left knee  . Fibromyalgia   . GERD 07/20/2006  . GERD (gastroesophageal reflux disease)   . Headache(784.0)   . History of transient ischemic attack (TIA)   . HPV (human papilloma virus) infection   . Hyperlipidemia   . Hypertension   . Hypothyroidism   . Insulin dependent type 2 diabetes mellitus, controlled ([place])   . Lumbar disc disease   . Myocardial infarction ([place])    AGE 6    . Neuromuscular disorder ([place])    PERIPHERAL NEUROPATHY  . Peripheral vascular disease ([place])   . Personal history of malignant neoplasm of kidney(V10.52) 12/14/2008   Laparoscopic biopsy and cryoablation 7/08 Dr. Vernie Shanks   . Stroke [facility])    3 MINI STROKES  RT SIDED WEAKNESS  .  Vertigo     Past Surgical History:  Procedure Laterality Date  . ABDOMINAL AORTAGRAM N/A 08/21/2011   Procedure: ABDOMINAL Maxcine Ham;  Surgeon: Elam Dutch, MD;  Location: [facility] CATH LAB;  Service: Cardiovascular;  Laterality: N/A;  . ABDOMINAL AORTAGRAM N/A 03/16/2014   Procedure: ABDOMINAL Maxcine Ham;  Surgeon: Elam Dutch, MD;  Location: [facility] CATH LAB;  Service: Cardiovascular;  Laterality: N/A;  . ABDOMINAL HYSTERECTOMY    . APPENDECTOMY    . BLADDER SUSPENSION    . CARDIAC  CATHETERIZATION    . CHOLECYSTECTOMY OPEN    . ESOPHAGOGASTRODUODENOSCOPY (EGD) WITH PROPOFOL N/A 05/16/2014   Procedure: ESOPHAGOGASTRODUODENOSCOPY (EGD) WITH PROPOFOL with Balloon dilation;  Surgeon: Milus Banister, MD;  Location: [place];  Service: Endoscopy;  Laterality: N/A;  . ESOPHAGOGASTRODUODENOSCOPY (EGD) WITH PROPOFOL N/A 09/02/2015   Procedure: ESOPHAGOGASTRODUODENOSCOPY (EGD) WITH PROPOFOL;  Surgeon: Doran Stabler, MD;  Location: [place];  Service: Gastroenterology;  Laterality: N/A;  . FEMORAL-POPLITEAL BYPASS GRAFT  10/12/2011   Procedure: BYPASS GRAFT FEMORAL-POPLITEAL ARTERY;  Surgeon: Elam Dutch, MD;  Location[facility] OR;  Service: Vascular;  Laterality: Left;  Left Femoral-Popliteal Bypass Graft using 34mm x 80cm Propaten Graft with intraop arteriogram times one.  . FEMORAL-POPLITEAL BYPASS GRAFT Left 04/17/2014   Procedure: REDO LEFT FEMORAL-POPLITEAL ARTERY BYPASS USING GORE PROPATEN 74mmx80cm GRAFT;  Surgeon: Elam Dutch, MD;  Location: [place];  Service: Vascular;  Laterality: Left;  . FOOT SURGERY Left    "bone spurs"  . INTRAOPERATIVE ARTERIOGRAM  10/12/2011   Procedure: INTRA OPERATIVE ARTERIOGRAM;  Surgeon: Elam Dutch, MD;  Location: [place];  Service: Vascular;  Laterality: Left;  . KNEE ARTHROSCOPY Bilateral    "cartilage"  . Laparascopic cryoablation of left kidney  08/2006   Dr. Gladis Riffle for renal cell cancer  . LEFT HEART CATHETERIZATION WITH CORONARY ANGIOGRAM N/A 05/11/2011   Procedure: LEFT HEART CATHETERIZATION WITH CORONARY ANGIOGRAM;  Surgeon: Jolaine Artist, MD;  Location: [facility] CATH LAB;  Service: Cardiovascular;  Laterality: N/A;  . LUNG SURGERY Right    lung nodule removed from the right side  . PR VEIN BYPASS GRAFT,AORTO-FEM-POP  05/27/2010  . SHOULDER ARTHROSCOPY Bilateral    'Spurs"  . TUBAL LIGATION       reports that she quit smoking about 2 years ago. Her smoking use included Cigarettes. She has a 12.50 pack-year smoking  history. She has never used smokeless tobacco. She reports that she does not drink alcohol or use drugs.  No Known Allergies  Family History  Problem Relation Age of Onset  . Heart disease Mother   . Lung cancer Mother   . Cancer Mother        Lung  . Heart disease Father   . Lung cancer Father   . Cancer Father        Lung  . Heart disease Sister        CABG- Open Heart     Prior to Admission medications   Medication Sig Start Date End Date Taking? Authorizing Provider  atenolol (TENORMIN) 50 MG tablet Take 50 mg by mouth daily.   Yes [provider]  cilostazol (PLETAL) 100 MG tablet Take 1 tablet (100 mg total) by mouth 2 (two) times daily before a meal. 03/17/16  Yes Fields, Jessy Oto, MD  meloxicam (MOBIC) 7.5 MG tablet Take 7.5 mg by mouth 2 (two) times daily as needed (for pain or inflammation).   Yes [provider]  pravastatin (PRAVACHOL) 20 MG  tablet Take 20 mg by mouth daily.   Yes [provider]  ACCU-CHEK FASTCLIX LANCETS MISC Use 4 per day to check blood sugar dx code E11.9 11/29/14   Elayne Snare, MD  clopidogrel (PLAVIX) 75 MG tablet Take 75 mg by mouth daily.  04/02/14   [provider]  DULoxetine (CYMBALTA) 60 MG capsule Take 1 capsule (60 mg total) by mouth daily. 03/19/16   Elayne Snare, MD  ferrous sulfate 325 (65 FE) MG tablet Take 325 mg by mouth 2 (two) times daily with a meal.    [provider]  gabapentin (NEURONTIN) 100 MG capsule Take 200 mg by mouth at bedtime.  01/07/15   [provider]  HYDROcodone-acetaminophen (NORCO) 10-325 MG tablet Take 1 tablet by mouth every 4 (four) hours as needed for moderate pain or severe pain. 06/28/15   Elwin Mocha, MD  hydroxypropyl methylcellulose / hypromellose (ISOPTO TEARS / GONIOVISC) 2.5 % ophthalmic solution Place 1 drop into both eyes 4 (four) times daily as needed for dry eyes.    [provider]  Insulin Glargine (TOUJEO SOLOSTAR) 300 UNIT/ML SOPN  Inject 60 Units into the skin daily. 05/11/16   Geradine Girt, DO  insulin lispro (HUMALOG KWIKPEN) 100 UNIT/ML KiwkPen INJECT 10 UNITS 3 TIMES A DAY AS NEEDED FOR BLOOD SUGAR 05/11/16   Eulogio Bear U, DO  Insulin Pen Needle (BD PEN NEEDLE NANO U/F) 32G X 4 MM MISC Use to inject insulin 10/30/14   Elayne Snare, MD  lansoprazole (PREVACID) 30 MG capsule Take 1 capsule (30 mg total) by mouth daily. 05/11/16   Geradine Girt, DO  levothyroxine (SYNTHROID, LEVOTHROID) 137 MCG tablet TAKE 1 TABLET (137 MCG TOTAL) BY MOUTH DAILY BEFORE BREAKFAST. 02/04/16   Elayne Snare, MD  lisinopril (PRINIVIL,ZESTRIL) 5 MG tablet TAKE 1 TABLET (5 MG TOTAL) BY MOUTH DAILY. 04/22/15   Elayne Snare, MD  LORazepam (ATIVAN) 1 MG tablet Take 1 tablet (1 mg total) by mouth 3 times/day as needed-between meals & bedtime for anxiety. 06/28/15   Elwin Mocha, MD  metFORMIN (GLUCOPHAGE-XR) 750 MG 24 hr tablet TAKE 2 TABLETS BY MOUTH EVERY DAY 09/09/16   Elayne Snare, MD  nortriptyline (PAMELOR) 75 MG capsule Take 75 mg by mouth at bedtime. 01/24/14   [provider]  potassium chloride (KLOR-CON 10) 10 MEQ tablet Take 1 tablet (10 mEq total) by mouth daily. 03/19/16   Elayne Snare, MD  QUEtiapine (SEROQUEL) 100 MG tablet Take 100 mg by mouth at bedtime. 09/25/16   [provider]  tolterodine (DETROL LA) 4 MG 24 hr capsule Take 4 mg by mouth daily.     [provider]  trimethoprim-polymyxin b (POLYTRIM) ophthalmic solution Place 1 drop into the right eye every 4 (four) hours. 10/05/16   Larene Pickett, PA-C  Vitamin D, Ergocalciferol, (DRISDOL) 50000 units CAPS capsule Take 50,000 Units by mouth once a week. 09/18/16   [provider]    Physical Exam: Vitals:   10/12/16 1830 10/12/16 1845 10/12/16 1915 10/12/16 2045  BP: (!) 128/59 101/71 105/69 (!) 131/98  Pulse: (!) 48 (!) 46 (!) 44   Resp: 17 18 19 17   Temp:      TempSrc:      SpO2: 95% 95% 96%       Constitutional: NAD, calm,  listless Eyes: PERTLA, lids and conjunctivae normal ENMT: Mucous membranes are moist. Posterior pharynx clear of any exudate or lesions.   Neck: normal, supple, no masses,  no thyromegaly Respiratory: clear to auscultation bilaterally, no wheezing, no crackles. Normal respiratory effort.  Cardiovascular: Rate ~40 and regular. No significant JVD. Abdomen: No distension, no tenderness, no masses palpated. Bowel sounds active.  Musculoskeletal: no clubbing / cyanosis. No joint deformity upper and lower extremities.    Skin: no significant rashes, lesions, ulcers. Warm, dry, well-perfused. Neurologic: CN 2-12 grossly intact. Sensation intact, DTR normal. Strength 5/5 in all 4 limbs.  Psychiatric: Alert and oriented x 3. Calm, cooperative.     Labs on Admission: I have personally reviewed following labs and imaging studies  CBC:  Recent Labs Lab 10/12/16 1711  WBC 9.0  HGB 10.9*  HCT 33.0*  MCV 84.6  PLT 528   Basic Metabolic Panel:  Recent Labs Lab 10/12/16 1711  NA 134*  K 3.5  CL 98*  CO2 27  GLUCOSE 331*  BUN 16  CREATININE 1.99*  CALCIUM 8.7*   GFR: Estimated Creatinine Clearance: 23.5 mL/min (A) (by C-G formula based on SCr of 1.99 mg/dL (H)). Liver Function Tests: No results for input(s): AST, ALT, ALKPHOS, BILITOT, PROT, ALBUMIN in the last 168 hours. No results for input(s): LIPASE, AMYLASE in the last 168 hours. No results for input(s): AMMONIA in the last 168 hours. Coagulation Profile: No results for input(s): INR, PROTIME in the last 168 hours. Cardiac Enzymes:  Recent Labs Lab 10/12/16 1832  TROPONINI <0.03   BNP (last 3 results) No results for input(s): PROBNP in the last 8760 hours. HbA1C: No results for input(s): HGBA1C in the last 72 hours. CBG: No results for input(s): GLUCAP in the last 168 hours. Lipid Profile: No results for input(s): CHOL, HDL, LDLCALC, TRIG, CHOLHDL, LDLDIRECT in the last 72 hours. Thyroid Function Tests: No results  for input(s): TSH, T4TOTAL, FREET4, T3FREE, THYROIDAB in the last 72 hours. Anemia Panel: No results for input(s): VITAMINB12, FOLATE, FERRITIN, TIBC, IRON, RETICCTPCT in the last 72 hours. Urine analysis:    Component Value Date/Time   COLORURINE YELLOW 10/04/2016 2310   APPEARANCEUR CLOUDY (A) 10/04/2016 2310   LABSPEC 1.019 10/04/2016 2310   PHURINE 5.0 10/04/2016 2310   GLUCOSEU 50 (A) 10/04/2016 2310   GLUCOSEU >=1000 (A) 12/13/2015 1353   HGBUR SMALL (A) 10/04/2016 2310   BILIRUBINUR NEGATIVE 10/04/2016 2310   KETONESUR NEGATIVE 10/04/2016 2310   PROTEINUR 100 (A) 10/04/2016 2310   UROBILINOGEN 0.2 12/13/2015 1353   NITRITE NEGATIVE 10/04/2016 2310   LEUKOCYTESUR NEGATIVE 10/04/2016 2310   Sepsis Labs: @LABRCNTIP (procalcitonin:4,lacticidven:4) ) Recent Results (from the past 240 hour(s))  Urine culture     Status: Abnormal   Collection Time: 10/04/16 11:10 PM  Result Value Ref Range Status   Specimen Description URINE, RANDOM  Final   Special Requests ADDED 0115 10/05/16  Final   Culture MULTIPLE SPECIES PRESENT, SUGGEST RECOLLECTION (A)  Final   Report Status 10/06/2016 FINAL  Final     Radiological Exams on Admission: Dg Chest 2 View  Result Date: 10/12/2016 CLINICAL DATA:  Weakness and shortness of breath EXAM: CHEST  2 VIEW COMPARISON:  Chest radiograph 05/10/2016 FINDINGS: Unchanged cardiomegaly and calcific aortic atherosclerosis. No focal airspace consolidation or pulmonary edema. No pneumothorax or pleural effusion. IMPRESSION: Cardiomegaly without active cardiopulmonary disease. Electronically Signed   By: Ulyses Jarred M.D.   On: 10/12/2016 20:18   Ct Head Wo Contrast  Result Date: 10/12/2016 CLINICAL DATA:  Fall, head trauma, weakness EXAM: CT HEAD WITHOUT CONTRAST TECHNIQUE: Contiguous axial images were obtained from the base of the skull through the  vertex without intravenous contrast. COMPARISON:  MRI brain dated 09/01/2017 FINDINGS: Brain: No evidence of  acute infarction, hemorrhage, hydrocephalus, extra-axial collection or mass lesion/mass effect. Mild subcortical white matter and periventricular small vessel ischemic changes. Vascular: Intracranial atherosclerosis. Skull: Normal. Negative for fracture or focal lesion. Sinuses/Orbits: Partial opacification of the right ethmoid air cells. Prior functional endoscopic sinus surgery. Bilateral mastoid air cells are essentially clear. Other: None. IMPRESSION: No evidence of acute intracranial abnormality. Mild small vessel ischemic changes. Electronically Signed   By: Julian Hy M.D.   On: 10/12/2016 20:02    EKG: Independently reviewed. Undetermined rhythm, rate 44, incomplete RBBB.   Assessment/Plan  1. Bradycardia  - Pt presents with generalized weakness and falls, found to have HR in low-mid 40's  - No definite P-wave on EKG, but rhythm appears regular  - No anginal complaints, no focal neurologic deficits   - ED physician discussed with cardiology and observation on telemetry and tapering of beta-blocker advised  - Plan to check TSH and serial troponin measurements, continue cardiac monitoring, hold atenolol   2. Generalized weakness  - No focal neurologic deficits, head CT without acute finding  - Possibly related to bradycardia, addressed as above  - Check TSH, continue supportive care, PT asked to eval and tx   3. Insulin-dependent DM  - A1c was 8.8% in March '18  - Managed at home with Toujeo 60 units qD, Humalog 10 units TID, and metformin  - Check CBG with meals and qHS  - Start Lantus 60 units qD, Novolog 3 units with meals, and Novolog per high-intensity SSI    4. Hypertension  - BP at goal  - Managed at home with atenolol and lisinopril  - Hold atenolol in light of bradycardia, consider resuming at lower dose  - Hold lisinopril given unclear baseline renal fxn - Monitor and treat with hydralazine IVP's prn for now   5. CKD stage III-IV - SCr is 1.99 on admission,  stable relative to chem panel from one week earlier, but up from 1.5 last month  - Plan to hold lisinopril and Mobic, renally-dose medications as needed, and repeat chemistries in am    6. Hypothyroidism  - Check TSH given the presenting complaints  - Continue Synthroid   7. CAD  - No anginal complaints  - She is bradycardic in 40's and will be monitored on telemetry with serial troponin measurements  - Continue ASA 81, Plavix, statin  8. Anemia  - Hgb is 10.9 on admission, similar to priors  - No bleeding evident  - Continue iron-supplementation   DVT prophylaxis: sq heparin  Code Status: Full  Family Communication: Sister updated at bedside with patient's permission  Disposition Plan: Observe on telemetry Consults called: Cardiology Admission status: Observation     Vianne Bulls, MD [name] Pager [phone]  If 7PM-7AM, please contact night-coverage [email or web address] Password [email or web address]  10/12/2016, 10:18 PM

## 2016-10-12 NOTE — ED Provider Notes (Signed)
[facility] DEPT Provider Note   CSN: [id number] Arrival date & time: 10/12/16  1520     History   Chief Complaint Chief Complaint  Patient presents with  . Weakness    HPI   April Branch is a 71 y.o. female with a PMH of DM, CAD, and stroke presents for evaluation of generalized weakness and shortness of breath. The patient has had multiple visits for similar weakness in the past, but feels today is different. She reports feeling weak for several days, denies any focal extremity weakness, numbness, vision changes or slurred speech. She reports falling twice yesterday and hit her head. She is currently on antiplatelet therapy. Reports some blurry vision that resolved shortly after, denies scalp injury. Denies nausea, vomiting or headache. Patient also reports wheezing and shortness of breath, which began today has been constant in nature and is not improving. Patient reports she currently smokes a few cigarettes a day. Denies fever, chills, cough, chest pain or abdominal pain.      Past Medical History:  Diagnosis Date  . Anemia   . Anginal pain ([place])    1 MONTH AGO   . Anxiety   . Arthritis   . CAD (coronary artery disease)   . CAD (coronary artery disease) of bypass graft   . 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 Colon polyps 09/11/2010   Tubular adenoma  . COPD 12/14/2008   PATIENT DENIES    . Depression   . Diabetes mellitus age 78   insulin dependent  . Diabetic coma ([place]) Feb. 2014  . Fall at home Jan. 2, 2016  Jan. 13, 2016   Left knee  . Fibromyalgia   . GERD 07/20/2006  . GERD (gastroesophageal reflux disease)   . Headache(784.0)   . History of transient ischemic attack (TIA)   . HPV (human papilloma virus) infection   . Hyperlipidemia   . Hypertension   . Hypothyroidism   . Insulin dependent type 2 diabetes mellitus, controlled ([place])   . Lumbar disc disease   . Myocardial infarction ([place])    AGE 89    .  Neuromuscular disorder ([place])    PERIPHERAL NEUROPATHY  . Peripheral vascular disease ([place])   . Personal history of malignant neoplasm of kidney(V10.52) 12/14/2008   Laparoscopic biopsy and cryoablation 7/08 Dr. Vernie Shanks   . Stroke [facility])    3 MINI STROKES  RT SIDED WEAKNESS  . Vertigo     Patient Active Problem List   Diagnosis Date Noted  . Orthostatic syncope 05/10/2016  . Colitis 05/10/2016  . Anemia 05/10/2016  . Hypomagnesemia 05/10/2016  . Food impaction of esophagus 09/02/2015  . Diabetic foot ulcer ([place]) 06/24/2015  . Atheroscler of bypass graft of left leg with intermittent claudication ([place]) 04/17/2014  . Carotid stenosis 05/19/2013  . Acute kidney injury (nontraumatic) ([place]) 09/21/2012  . Chronic pain syndrome 03/13/2012  . Acute kidney injury ([place]) 03/13/2012  . Atherosclerosis of native arteries of the extremities with intermittent claudication 10/08/2011  . PVD (peripheral vascular disease) ([place]) 07/30/2011  . CAD (coronary artery disease) 05/10/2011  . Hyperlipidemia   . Unstable angina ([place]) 05/09/2011  . CERVICAL CANCER 12/14/2008  . Personal history of malignant neoplasm of kidney(V10.52) 12/14/2008  . COPD (chronic obstructive pulmonary disease) ([place]) 12/14/2008  . IBS 12/14/2008  . Essential hypertension   . Personal history of colonic polyps 08/13/2008  . DISORDER, TOBACCO USE 08/04/2006  . Hypothyroidism   .  Insulin dependent type 2 diabetes mellitus, controlled ([place])   . Lumbar disc disease   . ANXIETY STATE NOS 07/20/2006  . GERD 07/20/2006  . HX, PERSONAL, VENOUS THROMBOSIS/EMBOLISM 07/20/2006    Past Surgical History:  Procedure Laterality Date  . ABDOMINAL AORTAGRAM N/A 08/21/2011   Procedure: ABDOMINAL Maxcine Ham;  Surgeon: Elam Dutch, MD;  Location[facility] CATH LAB;  Service: Cardiovascular;  Laterality: N/A;  . ABDOMINAL AORTAGRAM N/A 03/16/2014   Procedure: ABDOMINAL Maxcine Ham;  Surgeon: Elam Dutch, MD;  Location: [facility] CATH LAB;   Service: Cardiovascular;  Laterality: N/A;  . ABDOMINAL HYSTERECTOMY    . APPENDECTOMY    . BLADDER SUSPENSION    . CARDIAC CATHETERIZATION    . CHOLECYSTECTOMY OPEN    . ESOPHAGOGASTRODUODENOSCOPY (EGD) WITH PROPOFOL N/A 05/16/2014   Procedure: ESOPHAGOGASTRODUODENOSCOPY (EGD) WITH PROPOFOL with Balloon dilation;  Surgeon: Milus Banister, MD;  Location: [facility];  Service: Endoscopy;  Laterality: N/A;  . ESOPHAGOGASTRODUODENOSCOPY (EGD) WITH PROPOFOL N/A 09/02/2015   Procedure: ESOPHAGOGASTRODUODENOSCOPY (EGD) WITH PROPOFOL;  Surgeon: Doran Stabler, MD;  Location: [place];  Service: Gastroenterology;  Laterality: N/A;  . FEMORAL-POPLITEAL BYPASS GRAFT  10/12/2011   Procedure: BYPASS GRAFT FEMORAL-POPLITEAL ARTERY;  Surgeon: Elam Dutch, MD;  Location: [facility] OR;  Service: Vascular;  Laterality: Left;  Left Femoral-Popliteal Bypass Graft using 69mm x 80cm Propaten Graft with intraop arteriogram times one.  . FEMORAL-POPLITEAL BYPASS GRAFT Left 04/17/2014   Procedure: REDO LEFT FEMORAL-POPLITEAL ARTERY BYPASS USING GORE PROPATEN 5mmx80cm GRAFT;  Surgeon: Elam Dutch, MD;  Location: [place] [facility];  Service: Vascular;  Laterality: Left;  . FOOT SURGERY Left    "bone spurs"  . INTRAOPERATIVE ARTERIOGRAM  10/12/2011   Procedure: INTRA OPERATIVE ARTERIOGRAM;  Surgeon: Elam Dutch, MD;  Location: [facility];  Service: Vascular;  Laterality: Left;  . KNEE ARTHROSCOPY Bilateral    "cartilage"  . Laparascopic cryoablation of left kidney  08/2006   Dr. Gladis Riffle for renal cell cancer  . LEFT HEART CATHETERIZATION WITH CORONARY ANGIOGRAM N/A 05/11/2011   Procedure: LEFT HEART CATHETERIZATION WITH CORONARY ANGIOGRAM;  Surgeon: Jolaine Artist, MD;  Location: [facility] CATH LAB;  Service: Cardiovascular;  Laterality: N/A;  . LUNG SURGERY Right    lung nodule removed from the right side  . PR VEIN BYPASS GRAFT,AORTO-FEM-POP  05/27/2010  . SHOULDER ARTHROSCOPY Bilateral    'Spurs"  . TUBAL LIGATION       OB History    No data available       Home Medications    Prior to Admission medications   Medication Sig Start Date End Date Taking? Authorizing Provider  atenolol (TENORMIN) 50 MG tablet Take 50 mg by mouth daily.   Yes [provider]  ACCU-CHEK FASTCLIX LANCETS MISC Use 4 per day to check blood sugar dx code E11.9 11/29/14   Elayne Snare, MD  cilostazol (PLETAL) 100 MG tablet Take 1 tablet (100 mg total) by mouth 2 (two) times daily before a meal. 03/17/16   Elam Dutch, MD  clopidogrel (PLAVIX) 75 MG tablet Take 75 mg by mouth daily.  04/02/14   [provider]  DULoxetine (CYMBALTA) 60 MG capsule Take 1 capsule (60 mg total) by mouth daily. 03/19/16   Elayne Snare, MD  ferrous sulfate 325 (65 FE) MG tablet Take 325 mg by mouth 2 (two) times daily with a meal.    [provider]  gabapentin (NEURONTIN) 100 MG capsule Take 200 mg by mouth at bedtime.  01/07/15   [provider]  HYDROcodone-acetaminophen (NORCO) 10-325 MG tablet Take 1 tablet by mouth every 4 (four) hours as needed for moderate pain or severe pain. 06/28/15   Elwin Mocha, MD  hydroxypropyl methylcellulose / hypromellose (ISOPTO TEARS / GONIOVISC) 2.5 % ophthalmic solution Place 1 drop into both eyes 4 (four) times daily as needed for dry eyes.    [provider]  Insulin Glargine (TOUJEO SOLOSTAR) 300 UNIT/ML SOPN Inject 60 Units into the skin daily. 05/11/16   Geradine Girt, DO  insulin lispro (HUMALOG KWIKPEN) 100 UNIT/ML KiwkPen INJECT 10 UNITS 3 TIMES A DAY AS NEEDED FOR BLOOD SUGAR 05/11/16   Eulogio Bear U, DO  Insulin Pen Needle (BD PEN NEEDLE NANO U/F) 32G X 4 MM MISC Use to inject insulin 10/30/14   Elayne Snare, MD  lansoprazole (PREVACID) 30 MG capsule Take 1 capsule (30 mg total) by mouth daily. 05/11/16   Geradine Girt, DO  levothyroxine (SYNTHROID, LEVOTHROID) 137 MCG tablet TAKE 1 TABLET (137 MCG TOTAL) BY MOUTH DAILY BEFORE BREAKFAST. 02/04/16   Elayne Snare, MD  lisinopril (PRINIVIL,ZESTRIL) 5 MG tablet TAKE 1 TABLET (5 MG TOTAL) BY MOUTH DAILY. 04/22/15   Elayne Snare, MD  LORazepam (ATIVAN) 1 MG tablet Take 1 tablet (1 mg total) by mouth 3 times/day as needed-between meals & bedtime for anxiety. 06/28/15   Elwin Mocha, MD  metFORMIN (GLUCOPHAGE-XR) 750 MG 24 hr tablet TAKE 2 TABLETS BY MOUTH EVERY DAY 09/09/16   Elayne Snare, MD  nortriptyline (PAMELOR) 75 MG capsule Take 75 mg by mouth at bedtime. 01/24/14   [provider]  potassium chloride (KLOR-CON 10) 10 MEQ tablet Take 1 tablet (10 mEq total) by mouth daily. 03/19/16   Elayne Snare, MD  pravastatin (PRAVACHOL) 20 MG tablet Take 20 mg by mouth daily.    [provider]  QUEtiapine (SEROQUEL) 100 MG tablet Take 100 mg by mouth at bedtime. 09/25/16   [provider]  tolterodine (DETROL LA) 4 MG 24 hr capsule Take 4 mg by mouth daily.     [provider]  trimethoprim-polymyxin b (POLYTRIM) ophthalmic solution Place 1 drop into the right eye every 4 (four) hours. 10/05/16   Larene Pickett, PA-C  Vitamin D, Ergocalciferol, (DRISDOL) 50000 units CAPS capsule Take 50,000 Units by mouth once a week. 09/18/16   [provider]    Family History Family History  Problem Relation Age of Onset  . Heart disease Mother   . Lung cancer Mother   . Cancer Mother        Lung  . Heart disease Father   . Lung cancer Father   . Cancer Father        Lung  . Heart disease Sister        CABG- Open Heart    Social History Social History  Substance Use Topics  . Smoking status: Former Smoker    Packs/day: 0.25    Years: 50.00    Types: Cigarettes    Quit date: 06/24/2014  . Smokeless tobacco: Never Used  . Alcohol use No     Allergies   Patient has no known allergies.   Review of Systems Review of Systems  Constitutional: Negative for activity change, appetite change, chills, diaphoresis, fatigue and fever.  HENT: Negative for congestion, ear  pain, rhinorrhea, sinus pressure, sore throat and trouble swallowing.   Eyes: Negative for photophobia and visual disturbance.  Respiratory: Positive for chest tightness, shortness  of breath and wheezing. Negative for cough.   Cardiovascular: Negative for chest pain, palpitations and leg swelling.  Gastrointestinal: Negative for abdominal distention, abdominal pain, blood in stool, diarrhea, nausea and vomiting.  Genitourinary: Negative for difficulty urinating and dysuria.  Musculoskeletal: Negative for arthralgias and myalgias.  Skin: Negative for color change, rash and wound.  Neurological: Positive for weakness. Negative for dizziness, syncope, facial asymmetry, speech difficulty, light-headedness, numbness and headaches.     Physical Exam Updated Vital Signs BP 99/60 (BP Location: Left Arm)   Pulse (!) 42   Temp 97.7 F (36.5 C) (Oral)   Resp 20   SpO2 97%   Physical Exam  Constitutional: She appears well-developed and well-nourished. No distress.  HENT:  Head: Normocephalic and atraumatic.  Eyes: Pupils are equal, round, and reactive to light. EOM are normal. Right eye exhibits no discharge. Left eye exhibits no discharge.  Neck: Neck supple.  Cardiovascular: Regular rhythm, normal heart sounds and intact distal pulses.   bradycardia  Pulmonary/Chest: Effort normal and breath sounds normal. No respiratory distress.  Abdominal: Soft. Bowel sounds are normal. She exhibits no distension and no mass. There is no tenderness. There is no guarding.  Musculoskeletal: She exhibits no edema or deformity.  Neurological: She is alert. Coordination normal.  Speech is clear, able to follow commands CN III-XII intact Normal strength in upper and lower extremities bilaterally including dorsiflexion and plantar flexion, strong and equal grip strength Sensation normal to light and sharp touch Moves extremities without ataxia, coordination intact Normal finger to nose and rapid alternating  movements No pronator drift   Skin: Skin is warm and dry. Capillary refill takes less than 2 seconds. No rash noted. She is not diaphoretic. No erythema.  Psychiatric: She has a normal mood and affect. Her behavior is normal.  Nursing note and vitals reviewed.    ED Treatments / Results  Labs (all labs ordered are listed, but only abnormal results are displayed) Labs Reviewed  BASIC METABOLIC PANEL - Abnormal; Notable for the following:       Result Value   Sodium 134 (*)    Chloride 98 (*)    Glucose, Bld 331 (*)    Creatinine, Ser 1.99 (*)    Calcium 8.7 (*)    GFR calc non Af Amer 24 (*)    GFR calc Af Amer 28 (*)    All other components within normal limits  CBC - Abnormal; Notable for the following:    Hemoglobin 10.9 (*)    HCT 33.0 (*)    All other components within normal limits  URINALYSIS, ROUTINE W REFLEX MICROSCOPIC  TROPONIN I  BRAIN NATRIURETIC PEPTIDE    EKG  EKG Interpretation  Date/Time:  Monday October 12 2016 17:14:40 EDT Ventricular Rate:  44 PR Interval:    QRS Duration: 106 QT Interval:  446 QTC Calculation: 381 R Axis:   145 Text Interpretation:  Undetermined rhythm Incomplete right bundle branch block Possible Right ventricular hypertrophy Nonspecific T wave abnormality Abnormal ECG Since last tracing rate slower and voltage continues to be low Confirmed by Noemi Chapel [phone]) on 10/12/2016 5:45:38 PM       Radiology Dg Chest 2 View  Result Date: 10/12/2016 CLINICAL DATA:  Weakness and shortness of breath EXAM: CHEST  2 VIEW COMPARISON:  Chest radiograph 05/10/2016 FINDINGS: Unchanged cardiomegaly and calcific aortic atherosclerosis. No focal airspace consolidation or pulmonary edema. No pneumothorax or pleural effusion. IMPRESSION: Cardiomegaly without active cardiopulmonary disease. Electronically Signed   By:  Ulyses Jarred M.D.   On: 10/12/2016 20:18   Ct Head Wo Contrast  Result Date: 10/12/2016 CLINICAL DATA:  Fall, head trauma,  weakness EXAM: CT HEAD WITHOUT CONTRAST TECHNIQUE: Contiguous axial images were obtained from the base of the skull through the vertex without intravenous contrast. COMPARISON:  MRI brain dated 09/01/2017 FINDINGS: Brain: No evidence of acute infarction, hemorrhage, hydrocephalus, extra-axial collection or mass lesion/mass effect. Mild subcortical white matter and periventricular small vessel ischemic changes. Vascular: Intracranial atherosclerosis. Skull: Normal. Negative for fracture or focal lesion. Sinuses/Orbits: Partial opacification of the right ethmoid air cells. Prior functional endoscopic sinus surgery. Bilateral mastoid air cells are essentially clear. Other: None. IMPRESSION: No evidence of acute intracranial abnormality. Mild small vessel ischemic changes. Electronically Signed   By: Julian Hy M.D.   On: 10/12/2016 20:02    Procedures Procedures (including critical care time)  Medications Ordered in ED Medications  albuterol (PROVENTIL) (2.5 MG/3ML) 0.083% nebulizer solution 5 mg (not administered)  predniSONE (DELTASONE) tablet 60 mg (not administered)     Initial Impression / Assessment and Plan / ED Course  I have reviewed the triage vital signs and the nursing notes.  Pertinent labs & imaging results that were available during my care of the patient were reviewed by me and considered in my medical decision making (see chart for details).  Patient afebrile, bradycardiac at 40-45 bpm and wheezing on initial evaluation, does not appear to be in respiratory distress. Presents with generalized weakness and SOB. No focal neurologic deficits. Concern for new bradycardia as cause for generalized weakness. Not hypokalemic and renal function good, BMP and CBC unremarkable.   Trop, BNP, CXR, breathing tx and steroids ordered.  Fall with head trauma, on current antiplatelet therapy, CT head ordered.  Wheezing resolved after albuterol neb. Labs, EKG and CXR are unremarkable, ACS  unlikely. CT head shows no evidence of bleed. No focal infiltrate on CXR to suggest pneumonia. Patient continues to be bradycardic. Patient will be admitted for further evaluation of bradycardia.  Final Clinical Impressions(s) / ED Diagnoses   Final diagnoses:  Bradycardia  Weakness    New Prescriptions New Prescriptions   No medications on file       Janet Berlin 10/13/16 0149    Noemi Chapel, MD 10/13/16 1241

## 2016-10-12 NOTE — ED Triage Notes (Signed)
Pt presents with c/o weakness. The weakness began on Sunday. She feels weak all over her body but most weak in her legs. She fell last night and hit her head. She c/o pain in her head and knees since. She has been here several times for weakness and falls and feels that she is not getting any better.

## 2016-10-12 NOTE — ED Provider Notes (Signed)
The patient is a 71 year old female, she presents to the hospital today with multiple complaints including shortness of breath and generalized weakness. The patient has had multiple visits in the past for similar weakness but states that today was different somehow. She's felt weak for several days, notes that this is generalized, not associated with blurred vision or slurred speech. She does report that she did have a fall today where she struck her head and had a little bit of blurred vision after that. She reports that the fall today was because of missing a step while she was going down the stairs. She denies any focal weakness or numbness, she does endorse having shortness of breath with increasing wheezing today.  On exam the patient does have a bradycardia of approximately 40-45 bpm, normal pulses, no edema, no JVD. The lungs exhibit bilateral rales as well as wheezing but she is not in distress and has no increased work of breathing. Abdomen is soft, mucous membranes are normal, the patient is able to follow commands without difficulty.  I discussed care with Cardiology - they agree with hospitalist admission Hospitalist paged - Dr. Myna Hidalgo to admit   EKG Interpretation  Date/Time:  Monday October 12 2016 17:14:40 EDT Ventricular Rate:  44 PR Interval:    QRS Duration: 106 QT Interval:  446 QTC Calculation: 381 R Axis:   145 Text Interpretation:  Undetermined rhythm Incomplete right bundle branch block Possible Right ventricular hypertrophy Nonspecific T wave abnormality Abnormal ECG Since last tracing rate slower and voltage continues to be low Confirmed by Noemi Chapel [phone]) on 10/12/2016 5:45:38 PM      I am concerned for this new bradycardia being a possible etiology of her generalized weakness however there is not appear to be hyperkalemia or acute renal insufficiency and instead she seems to be at baseline. She will need a troponin, chest x-ray, albuterol treatment to complement the  other workup that has artery been done. She may need to be admitted due to the bradycardia until she stabilizes.  Medical screening examination/treatment/procedure(s) were conducted as a shared visit with non-physician practitioner(s) and myself.  I personally evaluated the patient during the encounter.  Clinical Impression:   Final diagnoses:  Bradycardia  Weakness         Noemi Chapel, MD 10/13/16 1241

## 2016-10-13 DIAGNOSIS — Z794 Long term (current) use of insulin: Secondary | ICD-10-CM | POA: Diagnosis not present

## 2016-10-13 DIAGNOSIS — N183 Chronic kidney disease, stage 3 (moderate): Secondary | ICD-10-CM | POA: Diagnosis present

## 2016-10-13 DIAGNOSIS — F17210 Nicotine dependence, cigarettes, uncomplicated: Secondary | ICD-10-CM | POA: Diagnosis present

## 2016-10-13 DIAGNOSIS — R296 Repeated falls: Secondary | ICD-10-CM | POA: Diagnosis present

## 2016-10-13 DIAGNOSIS — R001 Bradycardia, unspecified: Secondary | ICD-10-CM | POA: Diagnosis present

## 2016-10-13 DIAGNOSIS — Y92230 Patient room in hospital as the place of occurrence of the external cause: Secondary | ICD-10-CM | POA: Diagnosis not present

## 2016-10-13 DIAGNOSIS — R29818 Other symptoms and signs involving the nervous system: Secondary | ICD-10-CM | POA: Diagnosis present

## 2016-10-13 DIAGNOSIS — I2510 Atherosclerotic heart disease of native coronary artery without angina pectoris: Secondary | ICD-10-CM | POA: Diagnosis not present

## 2016-10-13 DIAGNOSIS — E039 Hypothyroidism, unspecified: Secondary | ICD-10-CM | POA: Diagnosis present

## 2016-10-13 DIAGNOSIS — R778 Other specified abnormalities of plasma proteins: Secondary | ICD-10-CM

## 2016-10-13 DIAGNOSIS — J438 Other emphysema: Secondary | ICD-10-CM | POA: Diagnosis not present

## 2016-10-13 DIAGNOSIS — W19XXXA Unspecified fall, initial encounter: Secondary | ICD-10-CM | POA: Diagnosis present

## 2016-10-13 DIAGNOSIS — E11649 Type 2 diabetes mellitus with hypoglycemia without coma: Secondary | ICD-10-CM | POA: Diagnosis present

## 2016-10-13 DIAGNOSIS — I4510 Unspecified right bundle-branch block: Secondary | ICD-10-CM | POA: Diagnosis present

## 2016-10-13 DIAGNOSIS — K219 Gastro-esophageal reflux disease without esophagitis: Secondary | ICD-10-CM | POA: Diagnosis present

## 2016-10-13 DIAGNOSIS — E1122 Type 2 diabetes mellitus with diabetic chronic kidney disease: Secondary | ICD-10-CM | POA: Diagnosis present

## 2016-10-13 DIAGNOSIS — E785 Hyperlipidemia, unspecified: Secondary | ICD-10-CM | POA: Diagnosis present

## 2016-10-13 DIAGNOSIS — E1165 Type 2 diabetes mellitus with hyperglycemia: Secondary | ICD-10-CM | POA: Diagnosis present

## 2016-10-13 DIAGNOSIS — E1142 Type 2 diabetes mellitus with diabetic polyneuropathy: Secondary | ICD-10-CM | POA: Diagnosis present

## 2016-10-13 DIAGNOSIS — D509 Iron deficiency anemia, unspecified: Secondary | ICD-10-CM | POA: Diagnosis present

## 2016-10-13 DIAGNOSIS — R748 Abnormal levels of other serum enzymes: Secondary | ICD-10-CM | POA: Diagnosis not present

## 2016-10-13 DIAGNOSIS — I517 Cardiomegaly: Secondary | ICD-10-CM | POA: Diagnosis present

## 2016-10-13 DIAGNOSIS — S0990XA Unspecified injury of head, initial encounter: Secondary | ICD-10-CM | POA: Diagnosis present

## 2016-10-13 DIAGNOSIS — F329 Major depressive disorder, single episode, unspecified: Secondary | ICD-10-CM | POA: Diagnosis present

## 2016-10-13 DIAGNOSIS — T383X6A Underdosing of insulin and oral hypoglycemic [antidiabetic] drugs, initial encounter: Secondary | ICD-10-CM | POA: Diagnosis present

## 2016-10-13 DIAGNOSIS — D649 Anemia, unspecified: Secondary | ICD-10-CM | POA: Diagnosis not present

## 2016-10-13 DIAGNOSIS — E1151 Type 2 diabetes mellitus with diabetic peripheral angiopathy without gangrene: Secondary | ICD-10-CM | POA: Diagnosis present

## 2016-10-13 DIAGNOSIS — G894 Chronic pain syndrome: Secondary | ICD-10-CM | POA: Diagnosis present

## 2016-10-13 DIAGNOSIS — R079 Chest pain, unspecified: Secondary | ICD-10-CM | POA: Diagnosis present

## 2016-10-13 DIAGNOSIS — I2583 Coronary atherosclerosis due to lipid rich plaque: Secondary | ICD-10-CM | POA: Diagnosis not present

## 2016-10-13 DIAGNOSIS — E119 Type 2 diabetes mellitus without complications: Secondary | ICD-10-CM | POA: Diagnosis not present

## 2016-10-13 DIAGNOSIS — Y92009 Unspecified place in unspecified non-institutional (private) residence as the place of occurrence of the external cause: Secondary | ICD-10-CM | POA: Diagnosis not present

## 2016-10-13 DIAGNOSIS — T381X6A Underdosing of thyroid hormones and substitutes, initial encounter: Secondary | ICD-10-CM | POA: Diagnosis present

## 2016-10-13 DIAGNOSIS — R531 Weakness: Secondary | ICD-10-CM | POA: Diagnosis not present

## 2016-10-13 DIAGNOSIS — I10 Essential (primary) hypertension: Secondary | ICD-10-CM | POA: Diagnosis not present

## 2016-10-13 DIAGNOSIS — R7989 Other specified abnormal findings of blood chemistry: Secondary | ICD-10-CM

## 2016-10-13 DIAGNOSIS — J449 Chronic obstructive pulmonary disease, unspecified: Secondary | ICD-10-CM | POA: Diagnosis present

## 2016-10-13 LAB — BASIC METABOLIC PANEL
ANION GAP: 13 (ref 5–15)
BUN: 18 mg/dL (ref 6–20)
CALCIUM: 8.7 mg/dL — AB (ref 8.9–10.3)
CO2: 23 mmol/L (ref 22–32)
CREATININE: 2.05 mg/dL — AB (ref 0.44–1.00)
Chloride: 98 mmol/L — ABNORMAL LOW (ref 101–111)
GFR, EST AFRICAN AMERICAN: 27 mL/min — AB (ref >60)
GFR, EST NON AFRICAN AMERICAN: 24 mL/min — AB (ref >60)
Glucose, Bld: 415 mg/dL — ABNORMAL HIGH (ref 65–99)
Potassium: 3.6 mmol/L (ref 3.5–5.1)
Sodium: 134 mmol/L — ABNORMAL LOW (ref 135–145)

## 2016-10-13 LAB — URINALYSIS, ROUTINE W REFLEX MICROSCOPIC
Bilirubin Urine: NEGATIVE
Ketones, ur: NEGATIVE mg/dL
LEUKOCYTES UA: NEGATIVE
NITRITE: NEGATIVE
PH: 5 (ref 5.0–8.0)
PROTEIN: 30 mg/dL — AB
SPECIFIC GRAVITY, URINE: 1.014 (ref 1.005–1.030)

## 2016-10-13 LAB — CBC
HCT: 32.9 % — ABNORMAL LOW (ref 36.0–46.0)
Hemoglobin: 10.7 g/dL — ABNORMAL LOW (ref 12.0–15.0)
MCH: 27.6 pg (ref 26.0–34.0)
MCHC: 32.5 g/dL (ref 30.0–36.0)
MCV: 85 fL (ref 78.0–100.0)
PLATELETS: 287 10*3/uL (ref 150–400)
RBC: 3.87 MIL/uL (ref 3.87–5.11)
RDW: 14.9 % (ref 11.5–15.5)
WBC: 7.8 10*3/uL (ref 4.0–10.5)

## 2016-10-13 LAB — HEMOGLOBIN A1C
Hgb A1c MFr Bld: 10.1 % — ABNORMAL HIGH (ref 4.8–5.6)
Mean Plasma Glucose: 243.17 mg/dL

## 2016-10-13 LAB — TROPONIN I
TROPONIN I: 1.05 ng/mL — AB (ref <0.03)
Troponin I: <0.03 ng/mL (ref <0.03)
Troponin I: <0.03 ng/mL (ref <0.03)

## 2016-10-13 LAB — MRSA PCR SCREENING: MRSA by PCR: POSITIVE — AB

## 2016-10-13 LAB — GLUCOSE, CAPILLARY
GLUCOSE-CAPILLARY: 382 mg/dL — AB (ref 65–99)
GLUCOSE-CAPILLARY: 74 mg/dL (ref 65–99)
Glucose-Capillary: 446 mg/dL — ABNORMAL HIGH (ref 65–99)
Glucose-Capillary: 177 mg/dL — ABNORMAL HIGH (ref 65–99)

## 2016-10-13 LAB — TSH: TSH: 74.503 u[IU]/mL — AB (ref 0.350–4.500)

## 2016-10-13 LAB — T4, FREE: FREE T4: 0.62 ng/dL (ref 0.61–1.12)

## 2016-10-13 LAB — MAGNESIUM: Magnesium: 1.6 mg/dL — ABNORMAL LOW (ref 1.7–2.4)

## 2016-10-13 LAB — CBG MONITORING, ED: GLUCOSE-CAPILLARY: 370 mg/dL — AB (ref 65–99)

## 2016-10-13 MED ORDER — SODIUM CHLORIDE 0.9 % IV BOLUS (SEPSIS)
500.0000 mL | Freq: Once | INTRAVENOUS | Status: AC
  Administered 2016-10-13: 500 mL via INTRAVENOUS

## 2016-10-13 MED ORDER — MAGNESIUM SULFATE 2 GM/50ML IV SOLN
2.0000 g | Freq: Once | INTRAVENOUS | Status: AC
  Administered 2016-10-13: 2 g via INTRAVENOUS
  Filled 2016-10-13: qty 50

## 2016-10-13 MED ORDER — ENSURE ENLIVE PO LIQD
237.0000 mL | Freq: Two times a day (BID) | ORAL | Status: DC
  Administered 2016-10-15 (×2): 237 mL via ORAL

## 2016-10-13 MED ORDER — NITROGLYCERIN 0.4 MG SL SUBL: 0.4000 mg | SUBLINGUAL_TABLET | SUBLINGUAL | Status: DC | PRN

## 2016-10-13 MED ORDER — INSULIN ASPART 100 UNIT/ML ~~LOC~~ SOLN
17.0000 [IU] | Freq: Once | SUBCUTANEOUS | Status: AC
  Administered 2016-10-13: 17 [IU] via SUBCUTANEOUS

## 2016-10-13 MED ORDER — INSULIN ASPART 100 UNIT/ML ~~LOC~~ SOLN
14.0000 [IU] | Freq: Three times a day (TID) | SUBCUTANEOUS | Status: DC
  Administered 2016-10-13 – 2016-10-14 (×3): 14 [IU] via SUBCUTANEOUS

## 2016-10-13 MED ORDER — CHLORHEXIDINE GLUCONATE CLOTH 2 % EX PADS
6.0000 | MEDICATED_PAD | Freq: Every day | CUTANEOUS | Status: DC
  Administered 2016-10-13 – 2016-10-15 (×3): 6 via TOPICAL

## 2016-10-13 MED ORDER — MUPIROCIN 2 % EX OINT
1.0000 "application " | TOPICAL_OINTMENT | Freq: Two times a day (BID) | CUTANEOUS | Status: DC
  Administered 2016-10-13 – 2016-10-15 (×5): 1 via NASAL
  Filled 2016-10-13 (×2): qty 22

## 2016-10-13 NOTE — Progress Notes (Signed)
PROGRESS NOTE    TEMECA Branch  BZM:[id number]  DOB: 03-Oct-1945  DOA: 10/12/2016 PCP: [name], MD   Brief Admission Hx: April Branch is a 71 y.o. female with medical history significant for coronary artery disease, chronic kidney disease stage III-IV, chronic pain, hypothyroidism, hypertension, and insulin-dependent diabetes mellitus, now presenting to the emergency department with generalized weakness and falls.  MDM/Assessment & Plan:   1. Bradycardia symptomatic - initial HR in low-mid 40's , discontinued atenolol, follow on telemetry  2. Generalized weakness  -markedly elevated TSH could be contributing, pt says she stopped taking her thyroid replacement medication  3. Insulin-dependent DM Poorly controlled with complications - Q2E was 4.9% in March '18  - Managed at home with Toujeo 60 units qD, Humalog 10 units TID, and metformin  - Check CBG with meals and qHS  - Start Lantus 60 units qD, Novolog 14 units with meals, and Novolog per high-intensity SSI   - acute hyperglycemia from the 60 mg of prednisone that was given earlier.  Follow CBGs.  CBG (last 3)   Recent Labs  10/13/16 0106 10/13/16 0642  GLUCAP 370* 446*   4. Hypertension  - stable, following closely.  - Managed at home with atenolol and lisinopril, however atenolol has been discontinued.  - Hold lisinopril given unclear baseline renal fxn - Monitor and treat with hydralazine IVP's prn for now   5. CKD stage III-IV - SCr is 1.99 on admission, stable relative to chem panel from one week earlier, but up from 1.5 last month  - Plan to hold lisinopril and Mobic, renally-dose medications as needed, and repeat chemistries in am    6. Hypothyroidism - with elevated TSH>75 - Pt tells me she has not been taking her levothyroxine medication  - Continue Synthroid  Home dose  7. CAD  - No chest pain reported.   - given elevated troponin 1.01 this morning, will ask cardiology for consult,  concerned about NSTEMI - She is bradycardic in 40's and will be monitored on telemetry with serial troponin measurements  - Continue ASA 81, Plavix, statin, ordered NTG as needed, check repeat EKG.   8. Anemia  - Hgb is 10.9 on admission, similar to prior  - No bleeding evident  - Continue iron-supplementation, follow CBC.   9. Chronic Pain - Pt asking for her pain pills early this morning, will need to monitor this closely.   10. Depression/anxiety - resume her home duloxetine 60 mg daily.   DVT prophylaxis: sq heparin  Code Status: Full  Family Communication: Sister updated at bedside with patient's permission  Consults called: Cardiology  Subjective: Pt says she feels short of breath and weak, no chest pain.   Objective: Vitals:   10/13/16 0230 10/13/16 0422 10/13/16 0603 10/13/16 0819  BP:  (!) 146/68 (!) 162/70 (!) 169/86  Pulse: (!) 50 (!) 49 (!) 51   Resp: (!) 21 19    Temp:   98 F (36.7 C) 98.1 F (36.7 C)  TempSrc:    Oral  SpO2: 95% 91% 93%   Weight:   77.4 kg (170 lb 9.6 oz)   Height:   5\' 1"  (1.549 m)     Intake/Output Summary (Last 24 hours) at 10/13/16 1026 Last data filed at 10/13/16 7530  Gross per 24 hour  Intake                0 ml  Output  325 ml  Net             -325 ml   Filed Weights   10/13/16 0603  Weight: 77.4 kg (170 lb 9.6 oz)     REVIEW OF SYSTEMS  As per history otherwise all reviewed and reported negative  Exam:  General exam: chronically ill appearing and pale, cooperative, tearful and crying at times.  Respiratory system: Clear. No increased work of breathing. Cardiovascular system: S1 & S2 heard, RRR. No JVD, murmurs, gallops, clicks or pedal edema. Gastrointestinal system: Abdomen is nondistended, soft and nontender. Normal bowel sounds heard. Central nervous system: Alert and oriented. No focal neurological deficits. Extremities: no CCE.  Data Reviewed: Basic Metabolic Panel:  Recent Labs Lab  10/12/16 1711 10/13/16 0040 10/13/16 0400  NA 134*  --  134*  K 3.5  --  3.6  CL 98*  --  98*  CO2 27  --  23  GLUCOSE 331*  --  415*  BUN 16  --  18  CREATININE 1.99*  --  2.05*  CALCIUM 8.7*  --  8.7*  MG  --  1.6*  --    Liver Function Tests: No results for input(s): AST, ALT, ALKPHOS, BILITOT, PROT, ALBUMIN in the last 168 hours. No results for input(s): LIPASE, AMYLASE in the last 168 hours. No results for input(s): AMMONIA in the last 168 hours. CBC:  Recent Labs Lab 10/12/16 1711 10/13/16 0400  WBC 9.0 7.8  HGB 10.9* 10.7*  HCT 33.0* 32.9*  MCV 84.6 85.0  PLT 315 287   Cardiac Enzymes:  Recent Labs Lab 10/12/16 1832 10/13/16 0040 10/13/16 0400 10/13/16 0816  TROPONINI <0.03 <0.03 <0.03 1.05*   CBG (last 3)   Recent Labs  10/13/16 0106 10/13/16 0642  GLUCAP 370* 446*   Recent Results (from the past 240 hour(s))  Urine culture     Status: Abnormal   Collection Time: 10/04/16 11:10 PM  Result Value Ref Range Status   Specimen Description URINE, RANDOM  Final   Special Requests ADDED 0115 10/05/16  Final   Culture MULTIPLE SPECIES PRESENT, SUGGEST RECOLLECTION (A)  Final   Report Status 10/06/2016 FINAL  Final  MRSA PCR Screening     Status: Abnormal   Collection Time: 10/13/16  6:30 AM  Result Value Ref Range Status   MRSA by PCR POSITIVE (A) NEGATIVE Final    Comment:        The GeneXpert MRSA Assay (FDA approved for NASAL specimens only), is one component of a comprehensive MRSA colonization surveillance program. It is not intended to diagnose MRSA infection nor to guide or monitor treatment for MRSA infections. RESULT CALLED TO, READ BACK BY AND VERIFIED WITH: F. Paseda RN 9:45 10/13/16 ([email or web address])      Studies: Dg Chest 2 View  Result Date: 10/12/2016 CLINICAL DATA:  Weakness and shortness of breath EXAM: CHEST  2 VIEW COMPARISON:  Chest radiograph 05/10/2016 FINDINGS: Unchanged cardiomegaly and calcific aortic atherosclerosis. No  focal airspace consolidation or pulmonary edema. No pneumothorax or pleural effusion. IMPRESSION: Cardiomegaly without active cardiopulmonary disease. Electronically Signed   By: Ulyses Jarred M.D.   On: 10/12/2016 20:18   Ct Head Wo Contrast  Result Date: 10/12/2016 CLINICAL DATA:  Fall, head trauma, weakness EXAM: CT HEAD WITHOUT CONTRAST TECHNIQUE: Contiguous axial images were obtained from the base of the skull through the vertex without intravenous contrast. COMPARISON:  MRI brain dated 09/01/2017 FINDINGS: Brain: No evidence of acute infarction, hemorrhage, hydrocephalus, extra-axial collection  or mass lesion/mass effect. Mild subcortical white matter and periventricular small vessel ischemic changes. Vascular: Intracranial atherosclerosis. Skull: Normal. Negative for fracture or focal lesion. Sinuses/Orbits: Partial opacification of the right ethmoid air cells. Prior functional endoscopic sinus surgery. Bilateral mastoid air cells are essentially clear. Other: None. IMPRESSION: No evidence of acute intracranial abnormality. Mild small vessel ischemic changes. Electronically Signed   By: Julian Hy M.D.   On: 10/12/2016 20:02   Scheduled Meds: . Chlorhexidine Gluconate Cloth  6 each Topical Q0600  . cilostazol  100 mg Oral BID AC  . clopidogrel  75 mg Oral Daily  . DULoxetine  60 mg Oral Daily  . ferrous sulfate  325 mg Oral BID WC  . fesoterodine  8 mg Oral Daily  . gabapentin  200 mg Oral QHS  . heparin  5,000 Units Subcutaneous Q8H  . insulin aspart  0-20 Units Subcutaneous TID WC  . insulin aspart  0-5 Units Subcutaneous QHS  . insulin aspart  14 Units Subcutaneous TID WC  . insulin glargine  50 Units Subcutaneous Daily  . levothyroxine  137 mcg Oral QAC breakfast  . mupirocin ointment  1 application Nasal BID  . nortriptyline  75 mg Oral QHS  . pantoprazole  20 mg Oral Daily  . potassium chloride  10 mEq Oral Daily  . pravastatin  20 mg Oral Daily  . QUEtiapine  100 mg Oral  QHS  . sodium chloride flush  3 mL Intravenous Q12H  . sodium chloride flush  3 mL Intravenous Q12H   Continuous Infusions: . sodium chloride    . magnesium sulfate 1 - 4 g bolus IVPB 2 g (10/13/16 1093)    Principal Problem:   Bradycardia Active Problems:   Hypothyroidism   Insulin dependent type 2 diabetes mellitus, controlled (HCC)   Essential hypertension   COPD (chronic obstructive pulmonary disease) (HCC)   CAD (coronary artery disease)   Chronic pain syndrome   Anemia   General weakness   CKD (chronic kidney disease), stage III   Symptomatic bradycardia  Time spent:   Irwin Brakeman, MD, FAAFP [name] Pager [phone]  If 7PM-7AM, please contact night-coverage [email or web address] Password [email or web address] 10/13/2016, 10:26 AM    LOS: 0 days

## 2016-10-13 NOTE — Progress Notes (Signed)
Inpatient Diabetes Program Recommendations  AACE/ADA: New Consensus Statement on Inpatient Glycemic Control (2015)  Target Ranges:  Prepandial:   less than 140 mg/dL      Peak postprandial:   less than 180 mg/dL (1-2 hours)      Critically ill patients:  140 - 180 mg/dL   Results for April Branch, April Branch (MRN [id number]) as of 10/13/2016 14:53  Ref. Range 10/13/2016 01:06 10/13/2016 06:42 10/13/2016 11:22  Glucose-Capillary Latest Ref Range: 65 - 99 mg/dL 370 (H) 446 (H) 382 (H)  Results for April Branch, April Branch (MRN [id number]) as of 10/13/2016 14:53  Ref. Range 10/12/2016 17:11  Hemoglobin A1C Latest Ref Range: 4.8 - 5.6 % 10.1 (H)   Review of Glycemic Control  Diabetes history: DM2 Outpatient Diabetes medications: Toujeo 60 units QAM, Humalog 10 units TID with meals, Metformin XR 1500 mg daily Current orders for Inpatient glycemic control: Lantus 50 units daily, Novolog 14 units TID with meals, Novolog 0-20 units TID with meals, Novolog 0-5 units QHS  Inpatient Diabetes Program Recommendations: HgbA1C: A1C 10.6% on 10/12/16 indicating an average glucose of 243 mg/dl. Patient needs to follow up with Dr. [name] regarding DM control.  NOTE: Spoke with patient about diabetes and home regimen for diabetes control. Patient very drowsy and had to be re-stimulated several times during conversation but awakened easily each time. Patient reports that she is followed by Dr. [name] for diabetes management and currently she takes Toujeo 60 units QAM, Humalog 10 units TID with meals, Metformin XR 1500 mg daily as an outpatient for diabetes control. Patient reports that she is taking all DM medications as prescribed. However, per home medication list patient has not taken for last 30 days. Patient denies reporting that she is not taking the Toujeo and again states that she is taking Toujeo 60 units QAM.  Patient does report that Dr. [name] had her stop one basal insulin (can not remember name) and start Toujeo about  3-4 months ago. Patient states that she checks her glucose 3 times per day and "it is good".  In further discuss of "it is good", patient reports that her glucose is usually in the 200's mg/dl range.  Discussed glucose and A1C goals.  Discussed A1C results (10.1% on 10/12/16) and explained that her current A1C indicates an average glucose of 243 mg/dl over the past 2-3 months. Discussed importance of checking CBGs and maintaining good CBG control to prevent long-term and short-term complications. Explained how hyperglycemia leads to damage within blood vessels which lead to the common complications seen with uncontrolled diabetes. Stressed to the patient the importance of improving glycemic control to prevent further complications from uncontrolled diabetes. Discussed impact of nutrition, exercise, stress, sickness, and medications on diabetes control. Encouraged patient to check her glucose 4 times per day (before meals and at bedtime) and to keep a log book of glucose readings and insulin taken which she will need to take to doctor appointments. Encouraged patient to follow up with Dr. [name] regarding DM control.  Patient verbalized understanding of information discussed and she states that she has no further questions at this time related to diabetes.  Thanks, Barnie Alderman, RN, MSN, CDE Diabetes Coordinator Inpatient Diabetes Program [phone] (Team Pager)

## 2016-10-13 NOTE — ED Notes (Signed)
Spoke with April Branch in pharmacy about daily meds, being verified now.

## 2016-10-13 NOTE — Progress Notes (Signed)
CBG this morning was 446. MD on call paged. New orders received. Will continue to monitor patient.

## 2016-10-13 NOTE — Consult Note (Signed)
Cardiology Consultation:   Patient ID: April Branch; [id number]; 04-Dec-1945   Admit date: 10/12/2016 Date of Consult: 10/13/2016  Primary Care Provider: [name], MD Primary Cardiologist: Dr. Johnsie Cancel   Patient Profile:   April Branch is a 71 y.o. female with a hx of CAD, CKD stage III-IV, chronic pain, hypothyroidism, HTN, PAD s/p fem-pop bypass and IDDM who is being seen today for the evaluation of elevated troponins at the request of Dr. Wynetta Emery.  History of Present Illness:   [name] presented to the [facility] ED on 10/12/16 with generalized weakness and falls. She was found to have bradycardia with heart rates in the low-mid 40's. Atenolol was discontinued. She  Also had a markedly elevated TSH of 74.503. She apparently had stopped taking her thyroid replacement. She says that she was mad at the doctor. Upon assessment the patient is sleepy and I have to repeatedly wake her to answer questions. She does endorse right sided chest pain that lasts about 2 minutes occurring with exertion. She says that this has been present for years and is unable to give me any further description. She also notes shortness of breath with walking around in her house. She does not do much more exertion than that. She endorses orthopnea and occ PND. No edema. She continues to smoke about 1/2 PPD.  The patient has a history of mild CAD. A cardiac cath done in 2003 by Dr. Jaci Standard showed 30% circumflex stenosis but not much disease otherwise. She had another cardiac cath in 04/2011 by Dr. Haroldine Laws that revealed essentially nonobstructive CAD with diffuse disease in distal RCA consistent with diabetic vasculopathy and Normal LVEF. She was last seen in the office by Dr. Johnsie Cancel on 04/12/14 for pre-operative evaluation for planned left fem-pop bypass redo. She had a normal myovue study with no evidence of ischemia or infarction on 04/13/14. Her last echo in 04/2011 showed normal LVEF of 55-60%, mild LVH, grade 2  DD.   Carotids 3/15 with <40% bilateral disease   Significant findings: Troponins <0.03, <0.03, <0.03, 1.05 BNP 53.4 K+ 3.5,   Mag  1.6 TSH  74.503 CXR: Cardiomegaly without active cardiopulmonary disease  Past Medical History:  Diagnosis Date  . Anemia   . Anginal pain ([place])    1 MONTH AGO   . Anxiety   . Arthritis   . CAD (coronary artery disease)   . CAD (coronary artery disease) of bypass graft   . 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 Colon polyps 09/11/2010   Tubular adenoma  . COPD 12/14/2008   PATIENT DENIES    . Depression   . Diabetes mellitus age 89   insulin dependent  . Diabetic coma ([place]) Feb. 2014  . Fall at home Jan. 2, 2016  Jan. 13, 2016   Left knee  . Fibromyalgia   . GERD 07/20/2006  . GERD (gastroesophageal reflux disease)   . Headache(784.0)   . History of transient ischemic attack (TIA)   . HPV (human papilloma virus) infection   . Hyperlipidemia   . Hypertension   . Hypothyroidism   . Insulin dependent type 2 diabetes mellitus, controlled ([place])   . Lumbar disc disease   . Myocardial infarction ([place])    AGE 69    . Neuromuscular disorder ([place])    PERIPHERAL NEUROPATHY  . Peripheral vascular disease ([place])   . Personal history of malignant neoplasm of kidney(V10.52) 12/14/2008   Laparoscopic  biopsy and cryoablation 7/08 Dr. Vernie Shanks   . Stroke [facility])    3 MINI STROKES  RT SIDED WEAKNESS  . Vertigo     Past Surgical History:  Procedure Laterality Date  . ABDOMINAL AORTAGRAM N/A 08/21/2011   Procedure: ABDOMINAL Maxcine Ham;  Surgeon: Elam Dutch, MD;  Location: [facility] C[facility] LAB;  Service: Cardiovascular;  Laterality: N/A;  . ABDOMINAL AORTAGRAM N/A 03/16/2014   Procedure: ABDOMINAL Maxcine Ham;  Surgeon: Elam Dutch, MD;  Location: [facility] CATH LAB;  Service: Cardiovascular;  Laterality: N/A;  . ABDOMINAL HYSTERECTOMY    . APPENDECTOMY    . BLADDER SUSPENSION    . CARDIAC CATHETERIZATION    .  CHOLECYSTECTOMY OPEN    . ESOPHAGOGASTRODUODENOSCOPY (EGD) WITH PROPOFOL N/A 05/16/2014   Procedure: ESOPHAGOGASTRODUODENOSCOPY (EGD) WITH PROPOFOL with Balloon dilation;  Surgeon: Milus Banister, MD;  Location: [place];  Service: Endoscopy;  Laterality: N/A;  . ESOPHAGOGASTRODUODENOSCOPY (EGD) WITH PROPOFOL N/A 09/02/2015   Procedure: ESOPHAGOGASTRODUODENOSCOPY (EGD) WITH PROPOFOL;  Surgeon: Doran Stabler, MD;  Location: [place];  Service: Gastroenterology;  Laterality: N/A;  . FEMORAL-POPLITEAL BYPASS GRAFT  10/12/2011   Procedure: BYPASS GRAFT FEMORAL-POPLITEAL ARTERY;  Surgeon: Elam Dutch, MD;  Location: [facility] OR;  Service: Vascular;  Laterality: Left;  Left Femoral-Popliteal Bypass Graft using 87mm x 80cm Propaten Graft with intraop arteriogram times one.  . FEMORAL-POPLITEAL BYPASS GRAFT Left 04/17/2014   Procedure: REDO LEFT FEMORAL-POPLITEAL ARTERY BYPASS USING GORE PROPATEN 30mmx80cm GRAFT;  Surgeon: Elam Dutch, MD;  Location: [place];  Service: Vascular;  Laterality: Left;  . FOOT SURGERY Left    "bone spurs"  . INTRAOPERATIVE ARTERIOGRAM  10/12/2011   Procedure: INTRA OPERATIVE ARTERIOGRAM;  Surgeon: Elam Dutch, MD;  Location: [place];  Service: Vascular;  Laterality: Left;  . KNEE ARTHROSCOPY Bilateral    "cartilage"  . Laparascopic cryoablation of left kidney  08/2006   Dr. Gladis Riffle for renal cell cancer  . LEFT HEART CATHETERIZATION WITH CORONARY ANGIOGRAM N/A 05/11/2011   Procedure: LEFT HEART CATHETERIZATION WITH CORONARY ANGIOGRAM;  Surgeon: Jolaine Artist, MD;  Location: [facility] CATH LAB;  Service: Cardiovascular;  Laterality: N/A;  . LUNG SURGERY Right    lung nodule removed from the right side  . PR VEIN BYPASS GRAFT,AORTO-FEM-POP  05/27/2010  . SHOULDER ARTHROSCOPY Bilateral    'Spurs"  . TUBAL LIGATION       Home Medications:  Prior to Admission medications   Medication Sig Start Date End Date Taking? Authorizing Provider  atenolol (TENORMIN) 50 MG  tablet Take 50 mg by mouth daily.   Yes [provider]  cilostazol (PLETAL) 100 MG tablet Take 1 tablet (100 mg total) by mouth 2 (two) times daily before a meal. 03/17/16  Yes Fields, Jessy Oto, MD  clopidogrel (PLAVIX) 75 MG tablet Take 75 mg by mouth daily.  04/02/14  Yes [provider]  DULoxetine (CYMBALTA) 60 MG capsule Take 1 capsule (60 mg total) by mouth daily. 03/19/16  Yes Elayne Snare, MD  gabapentin (NEURONTIN) 100 MG capsule Take 200 mg by mouth at bedtime.  01/07/15  Yes [provider]  HYDROcodone-acetaminophen (NORCO) 10-325 MG tablet Take 1 tablet by mouth every 4 (four) hours as needed for moderate pain or severe pain. Patient taking differently: Take 1 tablet by mouth 3 (three) times daily as needed (for pain).  06/28/15  Yes Elwin Mocha, MD  insulin lispro (HUMALOG KWIKPEN) 100 UNIT/ML KiwkPen INJECT 10 UNITS 3 TIMES A DAY AS NEEDED  FOR BLOOD SUGAR 05/11/16  Yes Eulogio Bear U, DO  lansoprazole (PREVACID) 30 MG capsule Take 1 capsule (30 mg total) by mouth daily. 05/11/16  Yes Vann, Jessica U, DO  levothyroxine (SYNTHROID, LEVOTHROID) 137 MCG tablet TAKE 1 TABLET (137 MCG TOTAL) BY MOUTH DAILY BEFORE BREAKFAST. 02/04/16  Yes Elayne Snare, MD  lisinopril (PRINIVIL,ZESTRIL) 5 MG tablet TAKE 1 TABLET (5 MG TOTAL) BY MOUTH DAILY. 04/22/15  Yes Elayne Snare, MD  meloxicam (MOBIC) 7.5 MG tablet Take 7.5 mg by mouth 2 (two) times daily as needed (for pain or inflammation).   Yes [provider]  metFORMIN (GLUCOPHAGE-XR) 750 MG 24 hr tablet TAKE 2 TABLETS BY MOUTH EVERY DAY Patient taking differently: TAKE 1500 MG BY MOUTH EVERY DAY 09/09/16  Yes Elayne Snare, MD  nortriptyline (PAMELOR) 75 MG capsule Take 75 mg by mouth at bedtime. 01/24/14  Yes [provider]  potassium chloride (KLOR-CON 10) 10 MEQ tablet Take 1 tablet (10 mEq total) by mouth daily. 03/19/16  Yes Elayne Snare, MD  pravastatin (PRAVACHOL) 20 MG tablet Take 20 mg by mouth daily.    Yes [provider]  QUEtiapine (SEROQUEL) 100 MG tablet Take 100 mg by mouth at bedtime. 09/25/16  Yes [provider]  tolterodine (DETROL LA) 4 MG 24 hr capsule Take 4 mg by mouth daily.    Yes [provider]  Vitamin D, Ergocalciferol, (DRISDOL) 50000 units CAPS capsule Take 50,000 Units by mouth once a week. 09/18/16  Yes [provider]  ACCU-CHEK FASTCLIX LANCETS MISC Use 4 per day to check blood sugar dx code E11.9 Patient not taking: Reported on 10/13/2016 11/29/14   Elayne Snare, MD  ferrous sulfate 325 (65 FE) MG tablet Take 325 mg by mouth 2 (two) times daily with a meal.    [provider]  hydroxypropyl methylcellulose / hypromellose (ISOPTO TEARS / GONIOVISC) 2.5 % ophthalmic solution Place 1 drop into both eyes 4 (four) times daily as needed for dry eyes.    [provider]  Insulin Glargine (TOUJEO SOLOSTAR) 300 UNIT/ML SOPN Inject 60 Units into the skin daily. Patient not taking: Reported on 10/13/2016 05/11/16   Geradine Girt, DO  Insulin Pen Needle (BD PEN NEEDLE NANO U/F) 32G X 4 MM MISC Use to inject insulin Patient not taking: Reported on 10/13/2016 10/30/14   Elayne Snare, MD  LORazepam (ATIVAN) 1 MG tablet Take 1 tablet (1 mg total) by mouth 3 times/day as needed-between meals & bedtime for anxiety. 06/28/15   Elwin Mocha, MD  trimethoprim-polymyxin b (POLYTRIM) ophthalmic solution Place 1 drop into the right eye every 4 (four) hours. 10/05/16   Larene Pickett, PA-C    Inpatient Medications: Scheduled Meds: . Chlorhexidine Gluconate Cloth  6 each Topical Q0600  . cilostazol  100 mg Oral BID AC  . clopidogrel  75 mg Oral Daily  . DULoxetine  60 mg Oral Daily  . ferrous sulfate  325 mg Oral BID WC  . fesoterodine  8 mg Oral Daily  . gabapentin  200 mg Oral QHS  . heparin  5,000 Units Subcutaneous Q8H  . insulin aspart  0-20 Units Subcutaneous TID WC  . insulin aspart  0-5 Units Subcutaneous QHS  . insulin aspart  14  Units Subcutaneous TID WC  . insulin glargine  50 Units Subcutaneous Daily  . levothyroxine  137 mcg Oral QAC breakfast  . mupirocin ointment  1 application Nasal BID  . nortriptyline  75 mg Oral QHS  .  pantoprazole  20 mg Oral Daily  . potassium chloride  10 mEq Oral Daily  . pravastatin  20 mg Oral Daily  . QUEtiapine  100 mg Oral QHS  . sodium chloride flush  3 mL Intravenous Q12H  . sodium chloride flush  3 mL Intravenous Q12H   Continuous Infusions: . sodium chloride     PRN Meds: sodium chloride, acetaminophen **OR** acetaminophen, bisacodyl, hydrALAZINE, HYDROcodone-acetaminophen, hydroxypropyl methylcellulose / hypromellose, LORazepam, nitroGLYCERIN, ondansetron **OR** ondansetron (ZOFRAN) IV, senna-docusate, sodium chloride flush  Allergies:   No Known Allergies  Social History:   Social History   Social History  . Marital status: Widowed    Spouse name: N/A  . Number of children: 2  . Years of education: N/A   Occupational History  .  Not Employed   Social History Main Topics  . Smoking status: Former Smoker    Packs/day: 0.25    Years: 50.00    Types: Cigarettes    Quit date: 06/24/2014  . Smokeless tobacco: Never Used  . Alcohol use No  . Drug use: No  . Sexual activity: No   Other Topics Concern  . Not on file   Social History Narrative   Widow. Two sons. Husband died of lung cancer.               Family History:    Family History  Problem Relation Age of Onset  . Heart disease Mother   . Lung cancer Mother   . Cancer Mother        Lung  . Heart disease Father   . Lung cancer Father   . Cancer Father        Lung  . Heart disease Sister        CABG- Open Heart     ROS:  Please see the history of present illness.  ROS  All other ROS reviewed and negative.     Physical Exam/Data:   Vitals:   10/13/16 0422 10/13/16 0603 10/13/16 0819 10/13/16 1343  BP: (!) 146/68 (!) 162/70 (!) 169/86   Pulse: (!) 49 (!) 51    Resp: 19  (!) 21     Temp:  98 F (36.7 C) 98.1 F (36.7 C) 97.6 F (36.4 C)  TempSrc:   Oral Oral  SpO2: 91% 93% 95%   Weight:  170 lb 9.6 oz (77.4 kg)    Height:  5\' 1"  (1.549 m)      Intake/Output Summary (Last 24 hours) at 10/13/16 1425 Last data filed at 10/13/16 1100  Gross per 24 hour  Intake              240 ml  Output              625 ml  Net             -385 ml   Filed Weights   10/13/16 0603  Weight: 170 lb 9.6 oz (77.4 kg)   Body mass index is 32.23 kg/m.  General:  Well nourished, chronically ill appearing, very sleepy, in no acute distress HEENT: normal Lymph: no adenopathy Neck: no JVD Endocrine:  No thryomegaly Vascular: No carotid bruits; FA pulses 2+  Cardiac:  normal S1, S2; RRR; no murmur  Lungs:  clear to auscultation bilaterally, no wheezing, rhonchi or rales, except few scattered carckles Abd: soft, nontender, rounded, central obesity Ext: no edema Musculoskeletal:  No deformities, BUE and BLE strength normal and equal Skin: warm and dry  Neuro:  CNs 2-12 intact, no focal abnormalities noted Psych:  Blunted affect   EKG:  The EKG was personally reviewed and demonstrates:  First EKG showed Undetermined rhythm, 44 bpm, Incomplete right bundle branch block, Possible Right ventricular hypertrophy, Nonspecific T wave abnormality- low voltage EKG today: Sinus bradycardia at 50 bpm, Premature atrial complexes, Pulmonary disease pattern, RBBB Telemetry:  Telemetry was personally reviewed and demonstrates:  Sinus bradycardia in the 50's  Relevant CV Studies:  Cardiac catheterization 05/11/11 Left main:  Normal LAD: Large vessel which tails to the L distally. 20-30% mid lesion. LCX: Large co-dominant vessel. Large OM-1. Small OM-2. Several small PLs. 30% stenosis mid AV groove RCA: Small co-dominant vessel. Diffuse 30% prox through mid section. Severe diffuse disease distally c/w diabetic vasculopathy LV-gram done in the RAO projection: Ejection fraction = 60% No regional  wall motion abnormalities.  Assessment: 1. 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 Normal LVEF  Laboratory Data:  Chemistry Recent Labs Lab 10/12/16 1711 10/13/16 0400  NA 134* 134*  K 3.5 3.6  CL 98* 98*  CO2 27 23  GLUCOSE 331* 415*  BUN 16 18  CREATININE 1.99* 2.05*  CALCIUM 8.7* 8.7*  GFRNONAA 24* 24*  GFRAA 28* 27*  ANIONGAP 9 13    No results for input(s): PROT, ALBUMIN, AST, ALT, ALKPHOS, BILITOT in the last 168 hours. Hematology Recent Labs Lab 10/12/16 1711 10/13/16 0400  WBC 9.0 7.8  RBC 3.90 3.87  HGB 10.9* 10.7*  HCT 33.0* 32.9*  MCV 84.6 85.0  MCH 27.9 27.6  MCHC 33.0 32.5  RDW 14.8 14.9  PLT 315 287   Cardiac Enzymes Recent Labs Lab 10/12/16 1832 10/13/16 0040 10/13/16 0400 10/13/16 0816  TROPONINI <0.03 <0.03 <0.03 1.05*   No results for input(s): TROPIPOC in the last 168 hours.  BNP Recent Labs Lab 10/12/16 1832  BNP 53.4    DDimer No results for input(s): DDIMER in the last 168 hours.  Radiology/Studies:  Dg Chest 2 View  Result Date: 10/12/2016 CLINICAL DATA:  Weakness and shortness of breath EXAM: CHEST  2 VIEW COMPARISON:  Chest radiograph 05/10/2016 FINDINGS: Unchanged cardiomegaly and calcific aortic atherosclerosis. No focal airspace consolidation or pulmonary edema. No pneumothorax or pleural effusion. IMPRESSION: Cardiomegaly without active cardiopulmonary disease. Electronically Signed   By: Ulyses Jarred M.D.   On: 10/12/2016 20:18   Ct Head Wo Contrast  Result Date: 10/12/2016 CLINICAL DATA:  Fall, head trauma, weakness EXAM: CT HEAD WITHOUT CONTRAST TECHNIQUE: Contiguous axial images were obtained from the base of the skull through the vertex without intravenous contrast. COMPARISON:  MRI brain dated 09/01/2017 FINDINGS: Brain: No evidence of acute infarction, hemorrhage, hydrocephalus, extra-axial collection or mass lesion/mass effect. Mild  subcortical white matter and periventricular small vessel ischemic changes. Vascular: Intracranial atherosclerosis. Skull: Normal. Negative for fracture or focal lesion. Sinuses/Orbits: Partial opacification of the right ethmoid air cells. Prior functional endoscopic sinus surgery. Bilateral mastoid air cells are essentially clear. Other: None. IMPRESSION: No evidence of acute intracranial abnormality. Mild small vessel ischemic changes. Electronically Signed   By: Julian Hy M.D.   On: 10/12/2016 20:02    Assessment and Plan:   Elevated troponin -Troponins <0.03, <0.03, <0.03, 1.05 -Pt had normal nuclear perfusion scan in 2016 -Pt is difficult to assess, but she does admit to long standing right sided chest discomfort (noted in office note in 2016) and shortness of breath with exertion. No current chest pain. She is a smoker.  -  Continue to follow troponin trend - If next troponin normal, no need for further cardiac evaluation. Will follow.   CAD -Most recent cardiac cath in 04/2011 by Dr. Haroldine Laws revealed essentially nonobstructive CAD with diffuse disease in distal RCA consistent with diabetic vasculopathy and Normal LVEF. -Normal stress myovue in 03/2014. -Pt treated with plavix, BB (now on hold), ACE-I (now on hold), and statin -She continues to smoke.  -CVD risk factors include diabetes, HTN, HLD, smoking and central obesity  Bradycardia -Heart rates in the low-mid 40's, likely related to markedly depressed thyroid function and pt off of her thyroid replacement  -was on Atenolol 50 mg daily, now on hold for bradycardia. -Will see how she responds to cessation of BB and thyroid replacement  Hypertension -Atenolol stopped due to bradycardia. -Lisinopril is on hold with SCr of 1.99 and unknown baseline (recent range for previous 1.5 years 1.2-1.7) -Blood pressure has been variable with low of 99/60, high of 169/86, last reading was 130/60. Monitor with med changes.  -Hydralazine  prn elevated BP  CKD stage III-IV -SCr 1.99 on admission.  Recent range for previous 1.5 years 1.2-1.7 -Holding lisinopril and Mobic.  Hypothyroidism -TSH markedly elevated at 74.503. Pt had stopped taking thyroid supplement -Home dose of synthroid restarted  PAD -S/P left fem-pop bypass and redo in 2016 -On plavix and pletal and statin  Hyperlipidemia -On pravastatin 20 mg daily -No recent lipid panel in Epic. Will update lipid panel.   Anemia -Hgb 10.9 on admission, similar to previous. 2.05 today -No active bleeding -Being followed by IM  Insulin dependent diabetes -Last Hgb A1c 8.8% in 04/2016 -Management per IM  Signed, Daune Perch, NP  10/13/2016 2:25 PM

## 2016-10-13 NOTE — Evaluation (Signed)
Physical Therapy Evaluation Patient Details Name: April Branch MRN: [id number] DOB: 1946/02/15 Today's Date: 10/13/2016   History of Present Illness  April Branch is a 71 y.o. female with medical history significant for coronary artery disease, chronic kidney disease stage III-IV, chronic pain, hypothyroidism, hypertension, and insulin-dependent diabetes mellitus, admitted through ED with generalized weakness and falls.  Clinical Impression  Pt admitted with above diagnosis. Pt currently with functional limitations due to the deficits listed below (see PT Problem List). Pt currently mod I for bed mobility and minA for transfers and ambulation of 60 feet with RW.  Pt will benefit from skilled PT to increase their independence and safety with mobility to allow discharge to the venue listed below.       Follow Up Recommendations Home health PT;Supervision/Assistance - 24 hour    Equipment Recommendations  3in1 (PT);Rolling walker with 5" wheels    Recommendations for Other Services OT consult     Precautions / Restrictions Precautions Precautions: Fall Precaution Comments: hx of multiple falls in last 3 months Restrictions Weight Bearing Restrictions: No      Mobility  Bed Mobility Overal bed mobility: Modified Independent             General bed mobility comments: HoB elevated, used bed rails to pull to EoB  Transfers Overall transfer level: Needs assistance Equipment used: Rolling walker (2 wheeled) Transfers: Sit to/from Stand Sit to Stand: Min assist         General transfer comment: minA for steadying in standing, vc for hand placement on RW   Ambulation/Gait Ambulation/Gait assistance: Min assist Ambulation Distance (Feet): 60 Feet Assistive device: Rolling walker (2 wheeled) Gait Pattern/deviations: Trunk flexed;Shuffle Gait velocity: slowed Gait velocity interpretation: Below normal speed for age/gender General Gait Details: minA for  steadying with gait, vc for upright posture and staying within RW, gait distance limited by increase in LBP         Balance Overall balance assessment: Needs assistance Sitting-balance support: No upper extremity supported;Feet supported Sitting balance-Leahy Scale: Good     Standing balance support: During functional activity;Single extremity supported Standing balance-Leahy Scale: Fair Standing balance comment: able to pull underware up and down with one hand on RW                             Pertinent Vitals/Pain Pain Assessment: 0-10 Pain Score: 8  Pain Location: bilateral knees, back,  Pain Descriptors / Indicators: Constant;Aching;Sore Pain Intervention(s): Limited activity within patient's tolerance    Home Living Family/patient expects to be discharged to:: Private residence Living Arrangements: Spouse/significant other Available Help at Discharge: Available 24 hours/day;Family Type of Home: House Home Access: Stairs to enter Entrance Stairs-Rails: Can reach both Entrance Stairs-Number of Steps: 2 Home Layout: One level Home Equipment: Environmental consultant - 4 wheels      Prior Function Level of Independence: Needs assistance   Gait / Transfers Assistance Needed: no AD but falls often, community ambulator   ADL's / Homemaking Assistance Needed: some assist with ADLs            Extremity/Trunk Assessment   Upper Extremity Assessment Upper Extremity Assessment: Generalized weakness    Lower Extremity Assessment Lower Extremity Assessment: Generalized weakness;LLE deficits/detail LLE Deficits / Details: L knee ROM limited by pain and minor swelling from fall sustained prior to admit        Communication   Communication:  (some slurred speech)  Cognition Arousal/Alertness: Awake/alert  Behavior During Therapy: WFL for tasks assessed/performed Overall Cognitive Status: Impaired/Different from baseline Area of Impairment: Awareness;Attention;Problem  solving;Memory                 Orientation Level: Time           Problem Solving: Slow processing;Difficulty sequencing        General Comments General comments (skin integrity, edema, etc.): at rest BP 163/70, HR 49 bpm, SaO@ on RA 96%, RR 17, with ambulation HR rose to max of 67 bpm, after activity BP 163/71, HR 47 bpm, SaO2 on RA 98%, RR 19        Assessment/Plan    PT Assessment Patient needs continued PT services  PT Problem List Decreased strength;Decreased range of motion;Decreased activity tolerance;Decreased balance;Decreased mobility;Decreased knowledge of use of DME;Cardiopulmonary status limiting activity;Pain       PT Treatment Interventions DME instruction;Gait training;Stair training;Functional mobility training;Therapeutic activities;Therapeutic exercise;Balance training;Patient/family education    PT Goals (Current goals can be found in the Care Plan section)  Acute Rehab PT Goals Patient Stated Goal: go home PT Goal Formulation: With patient Time For Goal Achievement: 10/20/16 Potential to Achieve Goals: Good    Frequency Min 3X/week    AM-PAC PT "6 Clicks" Daily Activity  Outcome Measure Difficulty turning over in bed (including adjusting bedclothes, sheets and blankets)?: A Little Difficulty moving from lying on back to sitting on the side of the bed? : A Little Difficulty sitting down on and standing up from a chair with arms (e.g., wheelchair, bedside commode, etc,.)?: A Little Help needed moving to and from a bed to chair (including a wheelchair)?: A Little Help needed walking in hospital room?: A Little Help needed climbing 3-5 steps with a railing? : A Lot 6 Click Score: 17    End of Session Equipment Utilized During Treatment: Gait belt Activity Tolerance: Patient limited by pain Patient left: in chair;with call bell/phone within reach;with chair alarm set;with nursing/sitter in room;with family/visitor present Nurse Communication:  Mobility status PT Visit Diagnosis: Unsteadiness on feet (R26.81);Other abnormalities of gait and mobility (R26.89);Repeated falls (R29.6);History of falling (Z91.81);Muscle weakness (generalized) (M62.81);Difficulty in walking, not elsewhere classified (R26.2);Dizziness and giddiness (R42);Pain Pain - Right/Left:  (bilateral ) Pain - part of body: Knee (back)    Time: 1478-2956 PT Time Calculation (min) (ACUTE ONLY): 49 min   Charges:   PT Evaluation $PT Eval Moderate Complexity: 1 Mod PT Treatments $Gait Training: 8-22 mins $Therapeutic Activity: 8-22 mins   PT G Codes:        Sakina Briones B. Migdalia Dk PT, DPT Acute Rehabilitation  [phone] Pager [phone]    [place] 10/13/2016, 11:04 AM

## 2016-10-14 ENCOUNTER — Other Ambulatory Visit (HOSPITAL_COMMUNITY): Payer: Medicare Other

## 2016-10-14 DIAGNOSIS — I2510 Atherosclerotic heart disease of native coronary artery without angina pectoris: Secondary | ICD-10-CM

## 2016-10-14 DIAGNOSIS — E119 Type 2 diabetes mellitus without complications: Secondary | ICD-10-CM

## 2016-10-14 DIAGNOSIS — E039 Hypothyroidism, unspecified: Principal | ICD-10-CM

## 2016-10-14 DIAGNOSIS — G894 Chronic pain syndrome: Secondary | ICD-10-CM

## 2016-10-14 DIAGNOSIS — R531 Weakness: Secondary | ICD-10-CM

## 2016-10-14 DIAGNOSIS — N183 Chronic kidney disease, stage 3 (moderate): Secondary | ICD-10-CM

## 2016-10-14 DIAGNOSIS — I2583 Coronary atherosclerosis due to lipid rich plaque: Secondary | ICD-10-CM

## 2016-10-14 DIAGNOSIS — Z794 Long term (current) use of insulin: Secondary | ICD-10-CM

## 2016-10-14 DIAGNOSIS — D649 Anemia, unspecified: Secondary | ICD-10-CM

## 2016-10-14 DIAGNOSIS — R001 Bradycardia, unspecified: Secondary | ICD-10-CM

## 2016-10-14 DIAGNOSIS — J438 Other emphysema: Secondary | ICD-10-CM

## 2016-10-14 DIAGNOSIS — I10 Essential (primary) hypertension: Secondary | ICD-10-CM

## 2016-10-14 LAB — LIPID PANEL
Cholesterol: 184 mg/dL (ref 0–200)
HDL: 43 mg/dL (ref >40)
LDL CALC: 107 mg/dL — AB (ref 0–99)
TRIGLYCERIDES: 169 mg/dL — AB (ref <150)
Total CHOL/HDL Ratio: 4.3 RATIO
VLDL: 34 mg/dL (ref 0–40)

## 2016-10-14 LAB — CBC
HEMATOCRIT: 32.3 % — AB (ref 36.0–46.0)
Hemoglobin: 10.5 g/dL — ABNORMAL LOW (ref 12.0–15.0)
MCH: 27.3 pg (ref 26.0–34.0)
MCHC: 32.5 g/dL (ref 30.0–36.0)
MCV: 83.9 fL (ref 78.0–100.0)
Platelets: 288 10*3/uL (ref 150–400)
RBC: 3.85 MIL/uL — ABNORMAL LOW (ref 3.87–5.11)
RDW: 14.9 % (ref 11.5–15.5)
WBC: 11.3 10*3/uL — ABNORMAL HIGH (ref 4.0–10.5)

## 2016-10-14 LAB — T3: T3, Total: 62 ng/dL — ABNORMAL LOW (ref 71–180)

## 2016-10-14 LAB — COMPREHENSIVE METABOLIC PANEL
ALBUMIN: 3.2 g/dL — AB (ref 3.5–5.0)
ALT: 16 U/L (ref 14–54)
ANION GAP: 13 (ref 5–15)
AST: 21 U/L (ref 15–41)
Alkaline Phosphatase: 78 U/L (ref 38–126)
BILIRUBIN TOTAL: 0.9 mg/dL (ref 0.3–1.2)
BUN: 27 mg/dL — ABNORMAL HIGH (ref 6–20)
CO2: 22 mmol/L (ref 22–32)
Calcium: 9 mg/dL (ref 8.9–10.3)
Chloride: 102 mmol/L (ref 101–111)
Creatinine, Ser: 2.16 mg/dL — ABNORMAL HIGH (ref 0.44–1.00)
GFR calc Af Amer: 26 mL/min — ABNORMAL LOW (ref >60)
GFR calc non Af Amer: 22 mL/min — ABNORMAL LOW (ref >60)
GLUCOSE: 123 mg/dL — AB (ref 65–99)
POTASSIUM: 3.5 mmol/L (ref 3.5–5.1)
Sodium: 137 mmol/L (ref 135–145)
TOTAL PROTEIN: 6.3 g/dL — AB (ref 6.5–8.1)

## 2016-10-14 LAB — GLUCOSE, CAPILLARY
GLUCOSE-CAPILLARY: 110 mg/dL — AB (ref 65–99)
GLUCOSE-CAPILLARY: 92 mg/dL (ref 65–99)
GLUCOSE-CAPILLARY: 51 mg/dL — AB (ref 65–99)
GLUCOSE-CAPILLARY: 71 mg/dL (ref 65–99)
GLUCOSE-CAPILLARY: 240 mg/dL — AB (ref 65–99)
Glucose-Capillary: 143 mg/dL — ABNORMAL HIGH (ref 65–99)

## 2016-10-14 LAB — MAGNESIUM: MAGNESIUM: 2 mg/dL (ref 1.7–2.4)

## 2016-10-14 LAB — TROPONIN I
Troponin I: <0.03 ng/mL (ref <0.03)
Troponin I: <0.03 ng/mL (ref <0.03)

## 2016-10-14 NOTE — Progress Notes (Signed)
PROGRESS NOTE  April Branch  WYS:[id number] DOB: 02-Oct-1945 DOA: 10/12/2016 PCP: [name], MD  Outpatient Specialists: Endocrinology, Dr. [name]  Brief Narrative: April Branch a 71 y.o.femalewith medical history significant for coronary artery disease, chronic kidney disease stage III-IV,chronic pain, hypothyroidism, hypertension, and insulin-dependent diabetes mellitus who presented 8/20 to the emergency department with generalized weakness and falls. She was bradycardic and diffusely weak without focal deficits. TSH was elevated to 74.503, and she admitted to stopping synthroid months ago. She initially complained of chest pain, so cardiology was consulted. This abated, ECG was nonischemic, and troponins were negative (with the exception of a single spurious value of 1.05 which was preceded and followed by a total of 6 undetectable levels). No further work up was planned. Synthroid was restarted and care management consulted. Physical therapy has evaluated the patient and recommended home health PT.   Assessment & Plan: Principal Problem:   Bradycardia Active Problems:   Hypothyroidism   Insulin dependent type 2 diabetes mellitus, controlled (HCC)   Essential hypertension   COPD (chronic obstructive pulmonary disease) (HCC)   CAD (coronary artery disease)   Chronic pain syndrome   Anemia   General weakness   CKD (chronic kidney disease), stage III   Symptomatic bradycardia   Elevated troponin  Hypothyroidism: With TSH 74 due to nonadherence.  - Restarted home dose synthroid, care management consulted - Will need follow up with PCP and/or endocrinology, Dr. [name] with repeat levels in 3-4 weeks.   Generalized weakness/fatigue: Due to above.  - PT evaluation: Recommending home health PT and DME  Sinus bradycardia: Due to untreated hypothyroidism as above and worsened by beta blocker use.  - Holding beta blocker - Treating with synthroid as above - Cardiology  has signed off, no further work up planned.  Poorly-controlled IDDM: HbA1c 10.1%. Pt reporting adherence to toujeo 60 units qD, Humalog 10 units TID, and metformin.  - Had hyperglycemia due to prednisone, and now having mild hypoglycemia.  - Giving insulin: Lantus 60 units qD, Novolog 14 units with meals + resistant SSI  - Diabetes educator has met with patient, will need follow up with Dr. [name].   Essential HTN: Chronic, stable - Holding lisinopril due to elevated creatinine, holding atenolol as above. Pressures normotensive.  Stage III CKD: SCr 1.99 on admission with unclear baseline.  - Holding ACE and NSAID - Monitor BMP  CAD: No further chest pain, nonischemic ECG, negative troponins. Initially concerned for NSTEMI with troponins undetectable x3 then 1.05, though this was followed by 3 undetectable troponins, so this is spurious.  - Cardiology recommending no further evalution - Continue ASA, plavix, statin  Chronic iron-deficiency anemia: Hgb is 10.9 on admission, similar to prior. No bleeding.  - Continue iron supplementation, monitor CBC  Depression: Chronic, stable - Continue duloxetine  DVT prophylaxis: Heparin Code Status: Full Family Communication: None at bedside, could not reach Son by phone Disposition Plan: Anticipate DC to home 8/23.   Consultants:   Cardiology, Dr. Oval Linsey  Procedures:   None  Antimicrobials:  None   Subjective: Still very fatigued, able to get up but it takes a lot of effort. No chest pain or dyspnea this morning.   Objective: Vitals:   10/13/16 1939 10/14/16 0454 10/14/16 0908 10/14/16 1343  BP: (!) 158/76 (!) 116/52 (!) 105/54 (!) 112/53  Pulse: (!) 49 (!) 53 (!) 56 (!) 51  Resp: 17 18  19   Temp: 98.2 F (36.8 C) 98.3 F (36.8 C)  TempSrc: Oral Oral    SpO2: 95% 95% 95% (!) 89%  Weight:  78.2 kg (172 lb 8 oz)    Height:        Intake/Output Summary (Last 24 hours) at 10/14/16 1826 Last data filed at 10/14/16 0855   Gross per 24 hour  Intake              120 ml  Output                0 ml  Net              120 ml   Filed Weights   10/13/16 0603 10/14/16 0454  Weight: 77.4 kg (170 lb 9.6 oz) 78.2 kg (172 lb 8 oz)    Examination: General exam: Chronicall ill-appearing female, frail but in no distress Respiratory system: Non-labored breathing room air. Clear to auscultation bilaterally.  Cardiovascular system: Regular bradycardia. No murmur, rub, or gallop. No JVD, and no pedal edema. Gastrointestinal system: Abdomen soft, non-tender, non-distended, with normoactive bowel sounds. No organomegaly or masses felt. Central nervous system: Alert and oriented. Diffusely weak with inconsistent effort. No focal neurological deficits. Extremities: Warm, no deformities Skin: No rashes, lesions no ulcers Psychiatry: Judgement and insight appear normal. Mood & affect appropriate.   Data Reviewed: I have personally reviewed following labs and imaging studies  CBC:  Recent Labs Lab 10/12/16 1711 10/13/16 0400 10/14/16 0619  WBC 9.0 7.8 11.3*  HGB 10.9* 10.7* 10.5*  HCT 33.0* 32.9* 32.3*  MCV 84.6 85.0 83.9  PLT 315 287 297   Basic Metabolic Panel:  Recent Labs Lab 10/12/16 1711 10/13/16 0040 10/13/16 0400 10/14/16 0619  NA 134*  --  134* 137  K 3.5  --  3.6 3.5  CL 98*  --  98* 102  CO2 27  --  23 22  GLUCOSE 331*  --  415* 123*  BUN 16  --  18 27*  CREATININE 1.99*  --  2.05* 2.16*  CALCIUM 8.7*  --  8.7* 9.0  MG  --  1.6*  --  2.0   GFR: Estimated Creatinine Clearance: 23.3 mL/min (A) (by C-G formula based on SCr of 2.16 mg/dL (H)). Liver Function Tests:  Recent Labs Lab 10/14/16 0619  AST 21  ALT 16  ALKPHOS 78  BILITOT 0.9  PROT 6.3*  ALBUMIN 3.2*   No results for input(s): LIPASE, AMYLASE in the last 168 hours. No results for input(s): AMMONIA in the last 168 hours. Coagulation Profile: No results for input(s): INR, PROTIME in the last 168 hours. Cardiac  Enzymes:  Recent Labs Lab 10/13/16 0400 10/13/16 0816 10/13/16 2129 10/14/16 0137 10/14/16 0619  TROPONINI <0.03 1.05* <0.03 <0.03 <0.03   BNP (last 3 results) No results for input(s): PROBNP in the last 8760 hours. HbA1C:  Recent Labs  10/12/16 1711  HGBA1C 10.1*   CBG:  Recent Labs Lab 10/14/16 0606 10/14/16 1043 10/14/16 1106 10/14/16 1625 10/14/16 1747  GLUCAP 143* 110* 92 51* 71   Lipid Profile:  Recent Labs  10/14/16 0619  CHOL 184  HDL 43  LDLCALC 107*  TRIG 169*  CHOLHDL 4.3   Thyroid Function Tests:  Recent Labs  10/13/16 0040 10/13/16 0816  TSH 74.503*  --   FREET4  --  0.62   Anemia Panel: No results for input(s): VITAMINB12, FOLATE, FERRITIN, TIBC, IRON, RETICCTPCT in the last 72 hours. Urine analysis:    Component Value Date/Time   COLORURINE YELLOW 10/13/2016 0040  APPEARANCEUR HAZY (A) 10/13/2016 0040   LABSPEC 1.014 10/13/2016 0040   PHURINE 5.0 10/13/2016 0040   GLUCOSEU >=500 (A) 10/13/2016 0040   GLUCOSEU >=1000 (A) 12/13/2015 1353   HGBUR SMALL (A) 10/13/2016 0040   BILIRUBINUR NEGATIVE 10/13/2016 0040   KETONESUR NEGATIVE 10/13/2016 0040   PROTEINUR 30 (A) 10/13/2016 0040   UROBILINOGEN 0.2 12/13/2015 1353   NITRITE NEGATIVE 10/13/2016 0040   LEUKOCYTESUR NEGATIVE 10/13/2016 0040   Recent Results (from the past 240 hour(s))  Urine culture     Status: Abnormal   Collection Time: 10/04/16 11:10 PM  Result Value Ref Range Status   Specimen Description URINE, RANDOM  Final   Special Requests ADDED 0115 10/05/16  Final   Culture MULTIPLE SPECIES PRESENT, SUGGEST RECOLLECTION (A)  Final   Report Status 10/06/2016 FINAL  Final  MRSA PCR Screening     Status: Abnormal   Collection Time: 10/13/16  6:30 AM  Result Value Ref Range Status   MRSA by PCR POSITIVE (A) NEGATIVE Final    Comment:        The GeneXpert MRSA Assay (FDA approved for NASAL specimens only), is one component of a comprehensive MRSA  colonization surveillance program. It is not intended to diagnose MRSA infection nor to guide or monitor treatment for MRSA infections. RESULT CALLED TO, READ BACK BY AND VERIFIED WITH: FChristiana Fuchs RN 9:45 10/13/16 ([email or web address])       Radiology Studies: Dg Chest 2 View  Result Date: 10/12/2016 CLINICAL DATA:  Weakness and shortness of breath EXAM: CHEST  2 VIEW COMPARISON:  Chest radiograph 05/10/2016 FINDINGS: Unchanged cardiomegaly and calcific aortic atherosclerosis. No focal airspace consolidation or pulmonary edema. No pneumothorax or pleural effusion. IMPRESSION: Cardiomegaly without active cardiopulmonary disease. Electronically Signed   By: Ulyses Jarred M.D.   On: 10/12/2016 20:18   Ct Head Wo Contrast  Result Date: 10/12/2016 CLINICAL DATA:  Fall, head trauma, weakness EXAM: CT HEAD WITHOUT CONTRAST TECHNIQUE: Contiguous axial images were obtained from the base of the skull through the vertex without intravenous contrast. COMPARISON:  MRI brain dated 09/01/2017 FINDINGS: Brain: No evidence of acute infarction, hemorrhage, hydrocephalus, extra-axial collection or mass lesion/mass effect. Mild subcortical white matter and periventricular small vessel ischemic changes. Vascular: Intracranial atherosclerosis. Skull: Normal. Negative for fracture or focal lesion. Sinuses/Orbits: Partial opacification of the right ethmoid air cells. Prior functional endoscopic sinus surgery. Bilateral mastoid air cells are essentially clear. Other: None. IMPRESSION: No evidence of acute intracranial abnormality. Mild small vessel ischemic changes. Electronically Signed   By: Julian Hy M.D.   On: 10/12/2016 20:02    Scheduled Meds: . Chlorhexidine Gluconate Cloth  6 each Topical Q0600  . cilostazol  100 mg Oral BID AC  . clopidogrel  75 mg Oral Daily  . DULoxetine  60 mg Oral Daily  . feeding supplement (ENSURE ENLIVE)  237 mL Oral BID BM  . ferrous sulfate  325 mg Oral BID WC  . fesoterodine  8 mg  Oral Daily  . gabapentin  200 mg Oral QHS  . heparin  5,000 Units Subcutaneous Q8H  . insulin aspart  0-20 Units Subcutaneous TID WC  . insulin aspart  0-5 Units Subcutaneous QHS  . insulin aspart  14 Units Subcutaneous TID WC  . insulin glargine  50 Units Subcutaneous Daily  . levothyroxine  137 mcg Oral QAC breakfast  . mupirocin ointment  1 application Nasal BID  . nortriptyline  75 mg Oral QHS  . pantoprazole  20 mg Oral  Daily  . potassium chloride  10 mEq Oral Daily  . pravastatin  20 mg Oral Daily  . QUEtiapine  100 mg Oral QHS  . sodium chloride flush  3 mL Intravenous Q12H  . sodium chloride flush  3 mL Intravenous Q12H   Continuous Infusions: . sodium chloride       LOS: 1 day   Time spent: 25 minutes.  Vance Gather, MD [name] Pager [phone]  If 7PM-7AM, please contact night-coverage [email or web address] Password [facility] 10/14/2016, 6:26 PM

## 2016-10-14 NOTE — Progress Notes (Signed)
Progress Note  Patient Name: April Branch Date of Encounter: 10/14/2016  Primary Cardiologist: Dr. Johnsie Cancel  Subjective   Feeling very tired and weak.  Denies chest pain or shortness of breath.  Inpatient Medications    Scheduled Meds: . Chlorhexidine Gluconate Cloth  6 each Topical Q0600  . cilostazol  100 mg Oral BID AC  . clopidogrel  75 mg Oral Daily  . DULoxetine  60 mg Oral Daily  . feeding supplement (ENSURE ENLIVE)  237 mL Oral BID BM  . ferrous sulfate  325 mg Oral BID WC  . fesoterodine  8 mg Oral Daily  . gabapentin  200 mg Oral QHS  . heparin  5,000 Units Subcutaneous Q8H  . insulin aspart  0-20 Units Subcutaneous TID WC  . insulin aspart  0-5 Units Subcutaneous QHS  . insulin aspart  14 Units Subcutaneous TID WC  . insulin glargine  50 Units Subcutaneous Daily  . levothyroxine  137 mcg Oral QAC breakfast  . mupirocin ointment  1 application Nasal BID  . nortriptyline  75 mg Oral QHS  . pantoprazole  20 mg Oral Daily  . potassium chloride  10 mEq Oral Daily  . pravastatin  20 mg Oral Daily  . QUEtiapine  100 mg Oral QHS  . sodium chloride flush  3 mL Intravenous Q12H  . sodium chloride flush  3 mL Intravenous Q12H   Continuous Infusions: . sodium chloride     PRN Meds: sodium chloride, acetaminophen **OR** acetaminophen, bisacodyl, hydrALAZINE, HYDROcodone-acetaminophen, hydroxypropyl methylcellulose / hypromellose, LORazepam, nitroGLYCERIN, ondansetron **OR** ondansetron (ZOFRAN) IV, senna-docusate, sodium chloride flush   Vital Signs    Vitals:   10/13/16 1455 10/13/16 1600 10/13/16 1939 10/14/16 0454  BP: 130/60 (!) 142/80 (!) 158/76 (!) 116/52  Pulse: (!) 47 (!) 49 (!) 49 (!) 53  Resp: 18 19 17 18   Temp:   98.2 F (36.8 C) 98.3 F (36.8 C)  TempSrc:   Oral Oral  SpO2: 98% 97% 95% 95%  Weight:    78.2 kg (172 lb 8 oz)  Height:        Intake/Output Summary (Last 24 hours) at 10/14/16 0853 Last data filed at 10/13/16 1200  Gross per 24  hour  Intake              480 ml  Output              300 ml  Net              180 ml   Filed Weights   10/13/16 0603 10/14/16 0454  Weight: 77.4 kg (170 lb 9.6 oz) 78.2 kg (172 lb 8 oz)    Telemetry    Sinus bradycardia. Rate 50s. - Personally Reviewed  ECG    N/a  - Personally Reviewed  Physical Exam   GEN: Tired-appearing. No acute distress.   Neck: No JVD Cardiac:  Bradycardic. Regular rhythm., no murmurs, rubs, or gallops.  Respiratory: Clear to auscultation bilaterally.  No crackles, wheezes, or rhonchi.  GI: Soft, nontender, non-distended  MS: No edema; No deformity. Neuro:  Nonfocal  Psych: Normal affect   Labs    Chemistry Recent Labs Lab 10/12/16 1711 10/13/16 0400 10/14/16 0619  NA 134* 134* 137  K 3.5 3.6 3.5  CL 98* 98* 102  CO2 27 23 22   GLUCOSE 331* 415* 123*  BUN 16 18 27*  CREATININE 1.99* 2.05* 2.16*  CALCIUM 8.7* 8.7* 9.0  PROT  --   --  6.3*  ALBUMIN  --   --  3.2*  AST  --   --  21  ALT  --   --  16  ALKPHOS  --   --  78  BILITOT  --   --  0.9  GFRNONAA 24* 24* 22*  GFRAA 28* 27* 26*  ANIONGAP 9 13 13      Hematology Recent Labs Lab 10/12/16 1711 10/13/16 0400 10/14/16 0619  WBC 9.0 7.8 11.3*  RBC 3.90 3.87 3.85*  HGB 10.9* 10.7* 10.5*  HCT 33.0* 32.9* 32.3*  MCV 84.6 85.0 83.9  MCH 27.9 27.6 27.3  MCHC 33.0 32.5 32.5  RDW 14.8 14.9 14.9  PLT 315 287 288    Cardiac Enzymes Recent Labs Lab 10/13/16 0816 10/13/16 2129 10/14/16 0137 10/14/16 0619  TROPONINI 1.05* <0.03 <0.03 <0.03   No results for input(s): TROPIPOC in the last 168 hours.   BNP Recent Labs Lab 10/12/16 1832  BNP 53.4     DDimer No results for input(s): DDIMER in the last 168 hours.   Radiology    Dg Chest 2 View  Result Date: 10/12/2016 CLINICAL DATA:  Weakness and shortness of breath EXAM: CHEST  2 VIEW COMPARISON:  Chest radiograph 05/10/2016 FINDINGS: Unchanged cardiomegaly and calcific aortic atherosclerosis. No focal airspace  consolidation or pulmonary edema. No pneumothorax or pleural effusion. IMPRESSION: Cardiomegaly without active cardiopulmonary disease. Electronically Signed   By: Ulyses Jarred M.D.   On: 10/12/2016 20:18   Ct Head Wo Contrast  Result Date: 10/12/2016 CLINICAL DATA:  Fall, head trauma, weakness EXAM: CT HEAD WITHOUT CONTRAST TECHNIQUE: Contiguous axial images were obtained from the base of the skull through the vertex without intravenous contrast. COMPARISON:  MRI brain dated 09/01/2017 FINDINGS: Brain: No evidence of acute infarction, hemorrhage, hydrocephalus, extra-axial collection or mass lesion/mass effect. Mild subcortical white matter and periventricular small vessel ischemic changes. Vascular: Intracranial atherosclerosis. Skull: Normal. Negative for fracture or focal lesion. Sinuses/Orbits: Partial opacification of the right ethmoid air cells. Prior functional endoscopic sinus surgery. Bilateral mastoid air cells are essentially clear. Other: None. IMPRESSION: No evidence of acute intracranial abnormality. Mild small vessel ischemic changes. Electronically Signed   By: Julian Hy M.D.   On: 10/12/2016 20:02    Cardiac Studies   n/a    Patient Profile     April Branch is a 28F with PAD s/p fem-pop bypass, medically managed CAD, hypertension, CKD III-IV, and diabetes admitted with weakness and falls.   Assessment & Plan    # Elevated troponin: This was an erroneous lab.  Three troponins prior and after the elevated lab were negative.  No need for ischemia evaluation at this time.  Will cancel echo.   # Bradycardia:  Heart rate is in the mid 50s since stopping atenolol.  This should also improve with correction of her hypotheyroidism.  We will sign off and arrange for follow-up within one month.  Please call if there are questions.   Signed, Skeet Latch, MD  10/14/2016, 8:53 AM

## 2016-10-14 NOTE — Progress Notes (Signed)
Hypoglycemic Event  CBG: 51  Treatment: juice given  Symptoms: asymptomatic  Follow-up CBG: Time:1747 CBG Result:71  Possible Reasons for Event: pt did not eat lunch  Comments/MD notified:Grunz    Angelia Mould, RN

## 2016-10-15 ENCOUNTER — Other Ambulatory Visit: Payer: Self-pay | Admitting: Endocrinology

## 2016-10-15 LAB — BASIC METABOLIC PANEL
ANION GAP: 10 (ref 5–15)
BUN: 31 mg/dL — ABNORMAL HIGH (ref 6–20)
CALCIUM: 8.8 mg/dL — AB (ref 8.9–10.3)
CO2: 26 mmol/L (ref 22–32)
Chloride: 100 mmol/L — ABNORMAL LOW (ref 101–111)
Creatinine, Ser: 2.27 mg/dL — ABNORMAL HIGH (ref 0.44–1.00)
GFR, EST AFRICAN AMERICAN: 24 mL/min — AB (ref >60)
GFR, EST NON AFRICAN AMERICAN: 21 mL/min — AB (ref >60)
GLUCOSE: 81 mg/dL (ref 65–99)
Potassium: 3.1 mmol/L — ABNORMAL LOW (ref 3.5–5.1)
Sodium: 136 mmol/L (ref 135–145)

## 2016-10-15 LAB — GLUCOSE, CAPILLARY
GLUCOSE-CAPILLARY: 119 mg/dL — AB (ref 65–99)
GLUCOSE-CAPILLARY: 54 mg/dL — AB (ref 65–99)
GLUCOSE-CAPILLARY: 107 mg/dL — AB (ref 65–99)
Glucose-Capillary: 221 mg/dL — ABNORMAL HIGH (ref 65–99)

## 2016-10-15 MED ORDER — LEVOTHYROXINE SODIUM 137 MCG PO TABS: 137.0000 ug | ORAL_TABLET | Freq: Every day | ORAL | 0 refills | Status: DC

## 2016-10-15 MED ORDER — INSULIN LISPRO 100 UNIT/ML (KWIKPEN): PEN_INJECTOR | SUBCUTANEOUS | 0 refills | Status: DC

## 2016-10-15 MED ORDER — INSULIN GLARGINE 300 UNIT/ML ~~LOC~~ SOPN: 30.0000 [IU] | PEN_INJECTOR | Freq: Every day | SUBCUTANEOUS | 0 refills | Status: DC

## 2016-10-15 MED ORDER — INSULIN ASPART 100 UNIT/ML ~~LOC~~ SOLN
5.0000 [IU] | Freq: Three times a day (TID) | SUBCUTANEOUS | Status: DC
  Administered 2016-10-15: 5 [IU] via SUBCUTANEOUS

## 2016-10-15 MED ORDER — INSULIN GLARGINE 100 UNIT/ML ~~LOC~~ SOLN
30.0000 [IU] | Freq: Every day | SUBCUTANEOUS | Status: DC
  Administered 2016-10-15: 30 [IU] via SUBCUTANEOUS
  Filled 2016-10-15: qty 0.3

## 2016-10-15 MED ORDER — DEXTROSE 50 % IV SOLN
INTRAVENOUS | Status: AC
  Administered 2016-10-15: 25 mL
  Filled 2016-10-15: qty 50

## 2016-10-15 MED ORDER — SODIUM CHLORIDE 0.9 % IV SOLN
INTRAVENOUS | Status: DC
  Administered 2016-10-15: 09:00:00 via INTRAVENOUS
  Filled 2016-10-15 (×2): qty 1000

## 2016-10-15 NOTE — Progress Notes (Signed)
Initial Nutrition Assessment  DOCUMENTATION CODES:   Obesity unspecified  INTERVENTION:  - Discontinue Ensure Enlive - Snacks BID   NUTRITION DIAGNOSIS:   Increased nutrient needs related to acute illness as evidenced by estimated needs.  GOAL:   Patient will meet greater than or equal to 90% of their needs  MONITOR:   PO intake, Supplement acceptance  REASON FOR ASSESSMENT:   Malnutrition Screening Tool    ASSESSMENT:   Pt with PMH of CKD stage III-IV, DM, GERD, CAD, HTN, hypothyroidism presents with generalized weakness, falls, and bradycardia  Pt consuming lunch tray at time of interview. Per chart review, pt has completed 25-75% of meals this admission. Pt reports a decrease in appetite PTA. Pt reports consuming 1 large meal per day typically breakfast.   Pt unsure of weight loss. Pt reports UBW of 169 lb. Per chart review, pt appears to have a recent upward weight trend.   Pt reports disliking Ensure Enlive and is unwilling to consume at this time. Pt amenable to snacks BID.  Pt had few episodes of hypoglycemia, could be related to erratic eating habits.   Labs reviewed; CBG 51-240, Potassium 3.1 Medications reviewed; ferrous sulfate, sliding scale insulin, lantus, protonix, potassium chloride, senokot-s, zofran  Nutrition-focused physical exam completed. Findings include no fat depletion, no muscle depletion and no edema.   Diet Order:  Diet heart healthy/carb modified Room service appropriate? Yes; Fluid consistency: Thin Diet - low sodium heart healthy  Skin:  Reviewed, no issues  Last BM:  10/13/16  Height:   Ht Readings from Last 1 Encounters:  10/13/16 5\' 1"  (1.549 m)    Weight:   Wt Readings from Last 1 Encounters:  10/15/16 174 lb 1.6 oz (79 kg)    Ideal Body Weight:  47.7 kg  BMI:  Body mass index is 32.9 kg/m.  Estimated Nutritional Needs:   Kcal:  1700-1900  Protein:  95-105 grams  Fluid:  >/= 1.7 L/d  EDUCATION NEEDS:    Education needs no appropriate at this time  Parks Ranger, RDN 10/15/2016 2:15 PM

## 2016-10-15 NOTE — Care Management Note (Signed)
Case Management Note Marvetta Gibbons RN, BSN Unit 4E-Case Manager [phone]  Patient Details  Name: April Branch MRN: [id number] Date of Birth: 02-06-1946  Subjective/Objective:     Pt admitted with bradycardia               Action/Plan: PTA pt lived at home with friend- recommendations for [facility]- orders have been placed for HHRN/PT- along with DME RW and 3n1- spoke at the bedside regarding [facility] services- along with TC to friend she stays with. Choice offered for [facility] services- per pt she states she does not have a preference as long as her insurance covers and is in network- referral given to [facility]- which is in network with pt's insurance provider- Tiffany with [facility] to f/u on referral. Pt also needs RW and 3n1 for home- notified karen with [facility] for DME needs - DME to be delivered to room prior to discharge.   Expected Discharge Date:  10/15/16               Expected Discharge Plan:  [place]  In-House Referral:  NA  Discharge planning Services  CM Consult  Post Acute Care Choice:  Home Health, Durable Medical Equipment Choice offered to:  Patient  DME Arranged:  3-N-1, Walker rolling DME Agency:  [place]:  RN, PT [facility] Agency:  [facility]  Status of Service:  Completed, signed off  If discussed at [place] of Stay Meetings, dates discussed:    Discharge Disposition: home/home health   Additional Comments:  Dawayne Patricia, RN 10/15/2016, 2:49 PM

## 2016-10-15 NOTE — Progress Notes (Signed)
Physical Therapy Treatment Patient Details Name: April Branch MRN: [id number] DOB: Oct 11, 1945 Today's Date: 10/15/2016    History of Present Illness April Branch is a 71 y.o. female with medical history significant for coronary artery disease, chronic kidney disease stage III-IV, chronic pain, hypothyroidism, hypertension, and insulin-dependent diabetes mellitus, admitted through ED with generalized weakness and falls.    PT Comments    Pt limited by fatigue but continues to make slow progress towards her goals. Pt currently mod I for bed mobility, and min A for transfers and ambulation of 50 feet with RW. Pt reports that her family does not want her to go home but she really wants to go home. PT continues to feel Pt would be safe for discharge home with 24 hour assistance for safety. Pt requires skilled PT to progress her mobility and to improve LE strength and endurance to safely mobilize in her home environment.    Follow Up Recommendations  Home health PT;Supervision/Assistance - 24 hour     Equipment Recommendations  3in1 (PT);Rolling walker with 5" wheels    Recommendations for Other Services OT consult     Precautions / Restrictions Precautions Precautions: Fall Precaution Comments: hx of multiple falls in last 3 months Restrictions Weight Bearing Restrictions: No    Mobility  Bed Mobility Overal bed mobility: Modified Independent             General bed mobility comments: used bed rails to pull to EoB and required extra time  Transfers Overall transfer level: Needs assistance Equipment used: Rolling walker (2 wheeled) Transfers: Sit to/from Stand Sit to Stand: Min assist         General transfer comment: minA for steadying in standing  Ambulation/Gait Ambulation/Gait assistance: Min assist Ambulation Distance (Feet): 50 Feet Assistive device: Rolling walker (2 wheeled) Gait Pattern/deviations: Trunk flexed;Shuffle Gait velocity:  slowed Gait velocity interpretation: Below normal speed for age/gender General Gait Details: minA for steadying with gait, vc for upright posture and staying within RW, gait distance limited by fatigue        Balance Overall balance assessment: Needs assistance Sitting-balance support: No upper extremity supported;Feet supported Sitting balance-Leahy Scale: Good     Standing balance support: During functional activity;Single extremity supported Standing balance-Leahy Scale: Fair Standing balance comment: able to pull underware up and down with one hand on RW                            Cognition Arousal/Alertness: Awake/alert Behavior During Therapy: WFL for tasks assessed/performed Overall Cognitive Status: Impaired/Different from baseline Area of Impairment: Awareness;Attention;Problem solving;Memory                 Orientation Level: Time           Problem Solving: Slow processing;Difficulty sequencing           General Comments General comments (skin integrity, edema, etc.): VSS throughout session      Pertinent Vitals/Pain Pain Assessment: 0-10 Pain Score: 8  Pain Location: bilateral knees, back,  Pain Descriptors / Indicators: Constant;Aching;Sore Pain Intervention(s): Monitored during session;Limited activity within patient's tolerance    Home Living Family/patient expects to be discharged to:: Private residence Living Arrangements: Spouse/significant other Available Help at Discharge: Available 24 hours/day;Family Type of Home: House Home Access: Stairs to enter Entrance Stairs-Rails: Can reach both Home Layout: One level Home Equipment: Walker - 4 wheels      Prior Function Level of Independence: Needs  assistance  Gait / Transfers Assistance Needed: no AD but falls often, community ambulator  ADL's / Homemaking Assistance Needed: some assist with ADLs      PT Goals (current goals can now be found in the care plan section) Acute  Rehab PT Goals Patient Stated Goal: go home PT Goal Formulation: With patient Time For Goal Achievement: 10/20/16 Potential to Achieve Goals: Good    Frequency    Min 3X/week      PT Plan Current plan remains appropriate       AM-PAC PT "6 Clicks" Daily Activity  Outcome Measure  Difficulty turning over in bed (including adjusting bedclothes, sheets and blankets)?: A Little Difficulty moving from lying on back to sitting on the side of the bed? : A Little Difficulty sitting down on and standing up from a chair with arms (e.g., wheelchair, bedside commode, etc,.)?: A Little Help needed moving to and from a bed to chair (including a wheelchair)?: A Little Help needed walking in hospital room?: A Little Help needed climbing 3-5 steps with a railing? : A Lot 6 Click Score: 17    End of Session Equipment Utilized During Treatment: Gait belt Activity Tolerance: Patient limited by fatigue Patient left: in chair;with call bell/phone within reach;with chair alarm set;with nursing/sitter in room Nurse Communication: Mobility status PT Visit Diagnosis: Unsteadiness on feet (R26.81);Other abnormalities of gait and mobility (R26.89);Repeated falls (R29.6);History of falling (Z91.81);Muscle weakness (generalized) (M62.81);Difficulty in walking, not elsewhere classified (R26.2);Dizziness and giddiness (R42);Pain Pain - Right/Left:  (bilateral ) Pain - part of body: Knee (back)     Time: 4193-7902 PT Time Calculation (min) (ACUTE ONLY): 37 min  Charges:  $Gait Training: 8-22 mins $Therapeutic Activity: 8-22 mins                    G Codes:       Geza Beranek B. Migdalia Dk PT, DPT Acute Rehabilitation  [phone] Pager [phone]   [place] 10/15/2016, 1:24 PM

## 2016-10-15 NOTE — Discharge Summary (Signed)
Physician Discharge Summary  April Branch RCB:[id number] DOB: 04-Oct-1945 DOA: 10/12/2016  PCP: [name], MD  Admit date: 10/12/2016 Discharge date: 10/15/2016  Admitted From: Home Disposition: Home   Recommendations for Outpatient Follow-up:  1. Follow up with PCP in 1-2 weeks 2. Follow up with endocrinology in 3-4 weeks. 3. Repeat thyroid testing. Restarted home synthroid after prolonged nonadherence (synthroid last filled May 2018 #30). TSH 74 here.  4. Monitor glucose, pt had not filled toujeo or humalog in 8-9 months, so these were prescribed at lower doses than previously (see below) based on inpatient requirements.  5. Follow up HR, BP. Holding beta blocker due to bradycardia and lisinopril due to creatinine elevation/and hasn't been filled since April 2017, with normal BP.  6. Please obtain BMP/CBC in one week.  Home Health: PT, also ordering RN for medication observation/management at home.  Equipment/Devices: 3-in-1, rolling walker Discharge Condition: Stable CODE STATUS: Full Diet recommendation: Heart healthy, carb-modified  Brief/Interim Summary: April Branch a 71 y.o.femalewith medical history significant for coronary artery disease, chronic kidney disease stage III-IV,chronic pain, hypothyroidism, hypertension, and insulin-dependent diabetes mellitus who presented 8/20 to the emergency department with generalized weakness and falls. She was bradycardic and diffusely weak without focal deficits. TSH was elevated to 74.503, and she admitted to stopping synthroid months ago. She initially complained of chest pain, so cardiology was consulted. This abated, ECG was nonischemic, and troponins were negative (with the exception of a single spurious value of 1.05 which was preceded and followed by a total of 6 undetectable levels). No further work up was planned. Synthroid was restarted and care management consulted. Physical therapy has evaluated the patient and  recommended home health PT. She has eaten well, walked in the halls and to the bathroom, and feels generally better.   Discharge Diagnoses:  Principal Problem:   Bradycardia Active Problems:   Hypothyroidism   Insulin dependent type 2 diabetes mellitus, controlled (HCC)   Essential hypertension   COPD (chronic obstructive pulmonary disease) (HCC)   CAD (coronary artery disease)   Chronic pain syndrome   Anemia   General weakness   CKD (chronic kidney disease), stage III   Symptomatic bradycardia   Elevated troponin  Hypothyroidism: With TSH 74 due to nonadherence.  - Restarted home dose synthroid, care management consulted to ensure she can afford this.  - Will need follow up with PCP and/or endocrinology, Dr. [name] with repeat levels in 3-4 weeks.   Generalized weakness/fatigue: Due to above.  - PT evaluation: Recommending home health PT and DME as above, arranged prior to discharge.  Sinus bradycardia: Due to untreated hypothyroidism as above and worsened by beta blocker use. Improving slowly.  - Holding beta blocker - Treating with synthroid as above - Cardiology has signed off, no further work up planned.  Poorly-controlled IDDM: HbA1c 10.1%. Pt reporting adherence to toujeo 60 units qD, Humalog 10 units TID, and metformin. Pharmacy records state toujeo has not been filled since Dec 2017 (90-day supply) and humalog not filled since Jan 2018.  - Has had recurrent hypoglycemia, mostly fasting and after a single administration of large mealtime insulin dose. Required 66 units insulin yesterday (50u lantus + 16u novolog), so will split that ~50/50 into lantus 30u qAM (prescribed) and 5u mealtime (was 14u) + SSI.   - Diabetes educator has met with patient, will need follow up with Dr. [name].   Essential HTN: Chronic, stable - Holding lisinopril due to elevated creatinine, holding atenolol as above. Pressures normotensive.  Stage III CKD: SCr 1.99 on admission with unclear  baseline. SCr trending slowly up in setting of poor per oral intake which has improved dramatically on day of discharge.  - Holding ACE and NSAIDs - Please recheck BMP at follow up.  CAD: No further chest pain, nonischemic ECG, negative troponins. Initially concerned for NSTEMI with troponins undetectable x3 then 1.05, though this was followed by 3 undetectable troponins, so this is spurious.  - Cardiology recommending no further evalution - Continue ASA, plavix, statin  Chronic iron-deficiency anemia: Hgb is 10.9 on admission, similar to prior. No bleeding.  - Continue iron supplementation, monitor CBC  Depression: Chronic, stable - Continue duloxetine  PAD: Chronic, stable - Continue cilostazol, plavix  Discharge Instructions Discharge Instructions    Diet - low sodium heart healthy    Complete by:  As directed    Discharge instructions    Complete by:  As directed    You were hospitalized due to weakness because of hypothyroidism which was treated with synthroid in the past. Because you stopped taking synthroid, this has caused severe symptoms which will improve over the course of weeks IF you start taking synthroid again. You are stable for discharge with the following recommendations:  - Continue taking synthroid (this was prescribed)  - Follow up with your PCP and endocrinologist, Dr. [name], soon.  - Because your blood sugars were low sometimes during this hospitalization, your insulin has been changed to [place] only 30 units once daily and HUMALOG only 5 units with meals. These were prescribed and should be taken as directed until you follow up with your doctors.  - Stop taking lisinopril (it doesn't seem you've been on this for over a year) - Stop taking mobic until you follow up with your doctor as this can damage your kidneys.  - You will receive home health services and medical equipment. This will be arranged prior to discharge.  - Your symptoms will be very slow to  improve, but will generally get better. If you get worse, experience fever or other concerning symptoms that are new, seek medical attention.   Increase activity slowly    Complete by:  As directed      Allergies as of 10/15/2016   No Known Allergies     Medication List    STOP taking these medications   atenolol 50 MG tablet Commonly known as:  TENORMIN   lisinopril 5 MG tablet Commonly known as:  PRINIVIL,ZESTRIL   meloxicam 7.5 MG tablet Commonly known as:  MOBIC     TAKE these medications   ACCU-CHEK FASTCLIX LANCETS Misc Use 4 per day to check blood sugar dx code E11.9   cilostazol 100 MG tablet Commonly known as:  PLETAL Take 1 tablet (100 mg total) by mouth 2 (two) times daily before a meal.   clopidogrel 75 MG tablet Commonly known as:  PLAVIX Take 75 mg by mouth daily.   DULoxetine 60 MG capsule Commonly known as:  CYMBALTA Take 1 capsule (60 mg total) by mouth daily.   ferrous sulfate 325 (65 FE) MG tablet Take 325 mg by mouth 2 (two) times daily with a meal.   gabapentin 100 MG capsule Commonly known as:  NEURONTIN Take 200 mg by mouth at bedtime.   HYDROcodone-acetaminophen 10-325 MG tablet Commonly known as:  NORCO Take 1 tablet by mouth every 4 (four) hours as needed for moderate pain or severe pain. What changed:  when to take this  reasons to take this  hydroxypropyl methylcellulose / hypromellose 2.5 % ophthalmic solution Commonly known as:  ISOPTO TEARS / GONIOVISC Place 1 drop into both eyes 4 (four) times daily as needed for dry eyes.   Insulin Glargine 300 UNIT/ML Sopn Commonly known as:  TOUJEO SOLOSTAR Inject 30 Units into the skin daily. What changed:  how much to take   insulin lispro 100 UNIT/ML KiwkPen Commonly known as:  HUMALOG KWIKPEN INJECT 5 UNITS 3 TIMES A DAY AS NEEDED FOR BLOOD SUGAR What changed:  additional instructions   Insulin Pen Needle 32G X 4 MM Misc Commonly known as:  BD PEN NEEDLE NANO U/F Use to  inject insulin   lansoprazole 30 MG capsule Commonly known as:  PREVACID Take 1 capsule (30 mg total) by mouth daily.   levothyroxine 137 MCG tablet Commonly known as:  SYNTHROID, LEVOTHROID Take 1 tablet (137 mcg total) by mouth daily before breakfast.   LORazepam 1 MG tablet Commonly known as:  ATIVAN Take 1 tablet (1 mg total) by mouth 3 times/day as needed-between meals & bedtime for anxiety.   metFORMIN 750 MG 24 hr tablet Commonly known as:  GLUCOPHAGE-XR TAKE 2 TABLETS BY MOUTH EVERY DAY What changed:  See the new instructions.   nortriptyline 75 MG capsule Commonly known as:  PAMELOR Take 75 mg by mouth at bedtime.   potassium chloride 10 MEQ tablet Commonly known as:  KLOR-CON 10 Take 1 tablet (10 mEq total) by mouth daily.   pravastatin 20 MG tablet Commonly known as:  PRAVACHOL Take 20 mg by mouth daily.   QUEtiapine 100 MG tablet Commonly known as:  SEROQUEL Take 100 mg by mouth at bedtime.   tolterodine 4 MG 24 hr capsule Commonly known as:  DETROL LA Take 4 mg by mouth daily.   trimethoprim-polymyxin b ophthalmic solution Commonly known as:  POLYTRIM Place 1 drop into the right eye every 4 (four) hours.   Vitamin D (Ergocalciferol) 50000 units Caps capsule Commonly known as:  DRISDOL Take 50,000 Units by mouth once a week.            Durable Medical Equipment        Start     Ordered   10/15/16 1258  For home use only DME Walker rolling  Once    Question Answer Comment  Patient needs a walker to treat with the following condition Generalized weakness   Patient needs a walker to treat with the following condition Unsteady gait      10/15/16 1257   10/15/16 1258  For home use only DME 3 n 1  Once     10/15/16 1257       Discharge Care Instructions        Start     Ordered   10/15/16 0000  Insulin Glargine (TOUJEO SOLOSTAR) 300 UNIT/ML SOPN  Daily     10/15/16 1312   10/15/16 0000  insulin lispro (HUMALOG KWIKPEN) 100 UNIT/ML  KiwkPen     10/15/16 1312   10/15/16 0000  levothyroxine (SYNTHROID, LEVOTHROID) 137 MCG tablet  Daily before breakfast     10/15/16 1312   10/15/16 0000  Increase activity slowly     10/15/16 1312   10/15/16 0000  Diet - low sodium heart healthy     10/15/16 1312   10/15/16 0000  Discharge instructions    Comments:  You were hospitalized due to weakness because of hypothyroidism which was treated with synthroid in the past. Because you stopped taking synthroid, this has  caused severe symptoms which will improve over the course of weeks IF you start taking synthroid again. You are stable for discharge with the following recommendations:  - Continue taking synthroid (this was prescribed)  - Follow up with your PCP and endocrinologist, Dr. [name], soon.  - Because your blood sugars were low sometimes during this hospitalization, your insulin has been changed to [place] only 30 units once daily and HUMALOG only 5 units with meals. These were prescribed and should be taken as directed until you follow up with your doctors.  - Stop taking lisinopril (it doesn't seem you've been on this for over a year) - Stop taking mobic until you follow up with your doctor as this can damage your kidneys.  - You will receive home health services and medical equipment. This will be arranged prior to discharge.  - Your symptoms will be very slow to improve, but will generally get better. If you get worse, experience fever or other concerning symptoms that are new, seek medical attention.   10/15/16 1312     Follow-up Information    Josue Hector, MD Follow up.   Specialty:  Cardiology Why:  office will conatct you Contact information: [address] [place] [address] [phone]        [name], MD. Schedule an appointment as soon as possible for a visit in 2 week(s).   Specialty:  Internal Medicine Contact information: [address], [place] [place]  [address] [phone]        Elayne Snare, MD. Schedule an appointment as soon as possible for a visit in 3 week(s).   Specialty:  Endocrinology Contact information: [place] [address] [phone]          No Known Allergies  Consultations:  Cardiology, Dr. Oval Linsey   Procedures/Studies: Dg Chest 2 View  Result Date: 10/12/2016 CLINICAL DATA:  Weakness and shortness of breath EXAM: CHEST  2 VIEW COMPARISON:  Chest radiograph 05/10/2016 FINDINGS: Unchanged cardiomegaly and calcific aortic atherosclerosis. No focal airspace consolidation or pulmonary edema. No pneumothorax or pleural effusion. IMPRESSION: Cardiomegaly without active cardiopulmonary disease. Electronically Signed   By: Ulyses Jarred M.D.   On: 10/12/2016 20:18   Dg Lumbar Spine Complete  Result Date: 10/04/2016 CLINICAL DATA:  Initial evaluation for recent fall 2 weeks ago, increased back pain and hip pain. EXAM: LUMBAR SPINE - COMPLETE 4+ VIEW COMPARISON:  Prior CT from 09/09/2013. FINDINGS: Five non rib-bearing lumbar type vertebral bodies. Mild dextroscoliosis. Vertebral bodies otherwise normally aligned with preservation of the normal lumbar lordosis. Vertebral body heights maintained. No acute fracture or subluxation. Visualized sacrum intact. Mild multilevel degenerative spondylolysis within the thorax lumbar spine, greatest at T12-L1. Prominent aortic atherosclerosis.  Cholecystectomy clips. IMPRESSION: 1. No radiographic evidence for acute abnormality within the lumbar spine. 2. Mild dextroscoliosis with multilevel degenerative spondylolysis, greatest at T12-L1. 3. Extensive aortic atherosclerosis. Electronically Signed   By: Jeannine Boga M.D.   On: 10/04/2016 19:18   Ct Head Wo Contrast  Result Date: 10/12/2016 CLINICAL DATA:  Fall, head trauma, weakness EXAM: CT HEAD WITHOUT CONTRAST TECHNIQUE: Contiguous axial images were obtained from the base of the skull through the vertex  without intravenous contrast. COMPARISON:  MRI brain dated 09/01/2017 FINDINGS: Brain: No evidence of acute infarction, hemorrhage, hydrocephalus, extra-axial collection or mass lesion/mass effect. Mild subcortical white matter and periventricular small vessel ischemic changes. Vascular: Intracranial atherosclerosis. Skull: Normal. Negative for fracture or focal lesion. Sinuses/Orbits:  Partial opacification of the right ethmoid air cells. Prior functional endoscopic sinus surgery. Bilateral mastoid air cells are essentially clear. Other: None. IMPRESSION: No evidence of acute intracranial abnormality. Mild small vessel ischemic changes. Electronically Signed   By: Julian Hy M.D.   On: 10/12/2016 20:02   Dg Hips Bilat With Pelvis 2v  Result Date: 10/04/2016 CLINICAL DATA:  Initial evaluation for recent fall 2 weeks ago, increase back and left hip pain. EXAM: DG HIP (WITH OR WITHOUT PELVIS) 2V BILAT COMPARISON:  None. FINDINGS: No acute fracture or dislocation about either hip. Femoral heads in normal alignment with the acetabula. Femoral head heights preserved. Bony pelvis intact. SI joints approximated and symmetric. Degenerative osteoarthritic changes present about the hips. Surgical clips overlie the left inguinal region. No acute soft tissue abnormality. IMPRESSION: 1. No acute osseous abnormality about the hips or pelvis. 2. 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 Electronically Signed   By: Jeannine Boga M.D.   On: 10/04/2016 19:22   Subjective: Feels less weak, eating much better today. Got up and walked halls with PT today, much better than previous evaluation. Discussed with sister by phone who stated mental status is at baseline.   Discharge Exam: Vitals:   10/15/16 0700 10/15/16 0800  BP: 112/61 101/78  Pulse: (!) 55 (!) 58  Resp: (!) 21 17  Temp:  98.3 F (36.8 C)  SpO2: 96% 97%   General: Pt is alert, awake, not in acute distress Cardiovascular:  Regular borderline bradycardia, S1/S2 +, no rubs, no gallops Respiratory: CTA bilaterally, no wheezing, no rhonchi Abdominal: Soft, NT, ND, bowel sounds + Extremities: No edema, no cyanosis  Labs: BNP (last 3 results)  Recent Labs  10/12/16 1832  BNP 81.8   Basic Metabolic Panel:  Recent Labs Lab 10/12/16 1711 10/13/16 0040 10/13/16 0400 10/14/16 0619 10/15/16 0349  NA 134*  --  134* 137 136  K 3.5  --  3.6 3.5 3.1*  CL 98*  --  98* 102 100*  CO2 27  --  23 22 26   GLUCOSE 331*  --  415* 123* 81  BUN 16  --  18 27* 31*  CREATININE 1.99*  --  2.05* 2.16* 2.27*  CALCIUM 8.7*  --  8.7* 9.0 8.8*  MG  --  1.6*  --  2.0  --    Liver Function Tests:  Recent Labs Lab 10/14/16 0619  AST 21  ALT 16  ALKPHOS 78  BILITOT 0.9  PROT 6.3*  ALBUMIN 3.2*   CBC:  Recent Labs Lab 10/12/16 1711 10/13/16 0400 10/14/16 0619  WBC 9.0 7.8 11.3*  HGB 10.9* 10.7* 10.5*  HCT 33.0* 32.9* 32.3*  MCV 84.6 85.0 83.9  PLT 315 287 288   Cardiac Enzymes:  Recent Labs Lab 10/13/16 0400 10/13/16 0816 10/13/16 2129 10/14/16 0137 10/14/16 0619  TROPONINI <0.03 1.05* <0.03 <0.03 <0.03   CBG:  Recent Labs Lab 10/14/16 2013 10/15/16 0131 10/15/16 0555 10/15/16 0622 10/15/16 1125  GLUCAP 240* 221* 54* 119* 107*   Hgb A1c  Recent Labs  10/12/16 1711  HGBA1C 10.1*   Lipid Profile  Recent Labs  10/14/16 0619  CHOL 184  HDL 43  LDLCALC 107*  TRIG 169*  CHOLHDL 4.3   Thyroid function studies  Recent Labs  10/13/16 0040  TSH 74.503*   Urinalysis    Component Value Date/Time   COLORURINE YELLOW 10/13/2016 0040   APPEARANCEUR HAZY (A) 10/13/2016 0040   LABSPEC 1.014 10/13/2016 0040  PHURINE 5.0 10/13/2016 0040   GLUCOSEU >=500 (A) 10/13/2016 0040   GLUCOSEU >=1000 (A) 12/13/2015 1353   HGBUR SMALL (A) 10/13/2016 0040   BILIRUBINUR NEGATIVE 10/13/2016 0040   KETONESUR NEGATIVE 10/13/2016 0040   PROTEINUR 30 (A) 10/13/2016 0040   UROBILINOGEN 0.2  12/13/2015 1353   NITRITE NEGATIVE 10/13/2016 0040   LEUKOCYTESUR NEGATIVE 10/13/2016 0040    Microbiology Recent Results (from the past 240 hour(s))  MRSA PCR Screening     Status: Abnormal   Collection Time: 10/13/16  6:30 AM  Result Value Ref Range Status   MRSA by PCR POSITIVE (A) NEGATIVE Final    Comment:        The GeneXpert MRSA Assay (FDA approved for NASAL specimens only), is one component of a comprehensive MRSA colonization surveillance program. It is not intended to diagnose MRSA infection nor to guide or monitor treatment for MRSA infections. RESULT CALLED TO, READ BACK BY AND VERIFIED WITH: FChristiana Fuchs RN 9:45 10/13/16 ([email or web address])     Time coordinating discharge: Approximately 40 minutes  Vance Gather, MD  [name] 10/15/2016, 1:12 PM Pager [phone]

## 2016-10-19 ENCOUNTER — Ambulatory Visit (INDEPENDENT_AMBULATORY_CARE_PROVIDER_SITE_OTHER): Payer: Medicare Other | Admitting: Neurology

## 2016-10-19 ENCOUNTER — Encounter: Payer: Self-pay | Admitting: Neurology

## 2016-10-19 VITALS — BP 109/67 | HR 58 | Ht 61.0 in | Wt 171.5 lb

## 2016-10-19 DIAGNOSIS — E1142 Type 2 diabetes mellitus with diabetic polyneuropathy: Secondary | ICD-10-CM | POA: Diagnosis not present

## 2016-10-19 DIAGNOSIS — R269 Unspecified abnormalities of gait and mobility: Secondary | ICD-10-CM | POA: Insufficient documentation

## 2016-10-19 DIAGNOSIS — I951 Orthostatic hypotension: Secondary | ICD-10-CM | POA: Diagnosis not present

## 2016-10-19 NOTE — Progress Notes (Signed)
PATIENT: April Branch DOB: Jul 18, 1945  Chief Complaint  Patient presents with  . Extremity Weakness    She is here with her friend, April Branch.  Reports multiple ED visits for generalized weakness, extremity pain and shortness of breath.  States her symptoms have caused her to have falls.  She is concerned due to her history of stroke.  She takes Plavix 75mg  daily.  Marland Kitchen PCP    [name], MD - referred from hospital     April Branch is a 71 years old right-handed female, accompanied by her friend April Branch, seen in refer by her primary care physician  Dr. Sheryle Hail, [place], for evaluation of weakness, she had multiple emergency room visit, initial evaluation was on October 19 2016.  I reviewed and summarized the referring note, she had a history of insulin-dependent diabetes, hypertension hyperlipidemia, taking seroquel 100 mg at bedtime, history of peripheral vascular disease, hypothyroidism, on supplement.  She reported difficulty walking since June 2015, she complains of generalized weakness, feeling shaky, bilateral feet numbness, lightheaded 1 get up quickly, has fell few times, carried a diagnosis of orthostatic syncope.  She has insulin-dependent diabetes, A1c was 10.1, when she get up from seated position, she has transient lightheadedness, dizziness, also complains of chronic neck pain, low back pain  I personally reviewed MRI of the brain in July 2018, generalized atrophy, supratentorium small vessel disease.    ultrasound of carotid artery in October 2017 less than 40% stenosis of bilateral internal carotid artery  I reviewed EKG in August 2018, heart rate was only 50,  Laboratory evaluation in August 2018 showed triglycerides 169, LDL 107, negative troponin, CMP showed elevated creatinine 2.16, elevated glucose 123, decreased T3 62, elevated TSH 74, A1c 10.1, CBC showed hemoglobin 10 point 9,  REVIEW OF SYSTEMS: Full 14 system review of systems  performed and notable only for as above   ALLERGIES: No Known Allergies  HOME MEDICATIONS: Current Outpatient Prescriptions  Medication Sig Dispense Refill  . ACCU-CHEK FASTCLIX LANCETS MISC Use 4 per day to check blood sugar dx code E11.9 102 each 3  . cilostazol (PLETAL) 100 MG tablet Take 1 tablet (100 mg total) by mouth 2 (two) times daily before a meal. 180 tablet 2  . clopidogrel (PLAVIX) 75 MG tablet Take 75 mg by mouth daily.     . DULoxetine (CYMBALTA) 60 MG capsule Take 1 capsule (60 mg total) by mouth daily. 90 capsule 1  . ferrous sulfate 325 (65 FE) MG tablet Take 325 mg by mouth 2 (two) times daily with a meal.    . gabapentin (NEURONTIN) 100 MG capsule Take 200 mg by mouth at bedtime.     Marland Kitchen HYDROcodone-acetaminophen (NORCO) 10-325 MG tablet Take 1 tablet by mouth every 4 (four) hours as needed for moderate pain or severe pain. (Patient taking differently: Take 1 tablet by mouth 3 (three) times daily as needed (for pain). ) 10 tablet 0  . hydroxypropyl methylcellulose / hypromellose (ISOPTO TEARS / GONIOVISC) 2.5 % ophthalmic solution Place 1 drop into both eyes 4 (four) times daily as needed for dry eyes.    . Insulin Glargine (TOUJEO SOLOSTAR) 300 UNIT/ML SOPN Inject 30 Units into the skin daily. 14 pen 0  . insulin lispro (HUMALOG KWIKPEN) 100 UNIT/ML KiwkPen INJECT 5 UNITS 3 TIMES A DAY AS NEEDED FOR BLOOD SUGAR 5 pen 0  . Insulin Pen Needle (BD PEN NEEDLE NANO U/F) 32G X 4 MM MISC Use to  inject insulin 100 each 3  . lansoprazole (PREVACID) 30 MG capsule Take 1 capsule (30 mg total) by mouth daily.    Marland Kitchen levothyroxine (SYNTHROID, LEVOTHROID) 137 MCG tablet Take 1 tablet (137 mcg total) by mouth daily before breakfast. 30 tablet 0  . LORazepam (ATIVAN) 1 MG tablet Take 1 tablet (1 mg total) by mouth 3 times/day as needed-between meals & bedtime for anxiety. 9 tablet 0  . metFORMIN (GLUCOPHAGE-XR) 750 MG 24 hr tablet TAKE 2 TABLETS BY MOUTH EVERY DAY (Patient taking differently:  TAKE 1500 MG BY MOUTH EVERY DAY) 60 tablet 0  . nortriptyline (PAMELOR) 75 MG capsule Take 75 mg by mouth at bedtime.    . potassium chloride (KLOR-CON 10) 10 MEQ tablet Take 1 tablet (10 mEq total) by mouth daily. 90 tablet 1  . pravastatin (PRAVACHOL) 20 MG tablet Take 20 mg by mouth daily.    . QUEtiapine (SEROQUEL) 100 MG tablet Take 100 mg by mouth at bedtime.  4  . tolterodine (DETROL LA) 4 MG 24 hr capsule Take 4 mg by mouth daily.     Marland Kitchen trimethoprim-polymyxin b (POLYTRIM) ophthalmic solution Place 1 drop into the right eye every 4 (four) hours. 10 mL 0  . Vitamin D, Ergocalciferol, (DRISDOL) 50000 units CAPS capsule Take 50,000 Units by mouth once a week.  11   No current facility-administered medications for this visit.     PAST MEDICAL HISTORY: Past Medical History:  Diagnosis Date  . Anemia   . Anginal pain ([place])    1 MONTH AGO   . Anxiety   . Arthritis   . CAD (coronary artery disease)   . CAD (coronary artery disease) of bypass graft   . 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 Colon polyps 09/11/2010   Tubular adenoma  . COPD 12/14/2008   PATIENT DENIES    . Depression   . Diabetes mellitus age 52   insulin dependent  . Diabetic coma ([place]) Feb. 2014  . Fall at home Jan. 2, 2016  Jan. 13, 2016   Left knee  . Fibromyalgia   . GERD 07/20/2006  . GERD (gastroesophageal reflux disease)   . Headache(784.0)   . History of transient ischemic attack (TIA)   . HPV (human papilloma virus) infection   . Hyperlipidemia   . Hypertension   . Hypothyroidism   . Insulin dependent type 2 diabetes mellitus, controlled ([place])   . Lumbar disc disease   . Myocardial infarction ([place])    AGE 71    . Neuromuscular disorder ([place])    PERIPHERAL NEUROPATHY  . Peripheral vascular disease ([place])   . Personal history of malignant neoplasm of kidney(V10.52) 12/14/2008   Laparoscopic biopsy and cryoablation 7/08 Dr. Vernie Shanks   . Stroke [facility])    3 MINI  STROKES  RT SIDED WEAKNESS  . Vertigo     PAST SURGICAL HISTORY: Past Surgical History:  Procedure Laterality Date  . ABDOMINAL AORTAGRAM N/A 08/21/2011   Procedure: ABDOMINAL Maxcine Ham;  Surgeon: Elam Dutch, MD;  Location: [facility] CATH LAB;  Service: Cardiovascular;  Laterality: N/A;  . ABDOMINAL AORTAGRAM N/A 03/16/2014   Procedure: ABDOMINAL Maxcine Ham;  Surgeon: Elam Dutch, MD;  Location: [facility] CATH LAB;  Service: Cardiovascular;  Laterality: N/A;  . ABDOMINAL HYSTERECTOMY    . APPENDECTOMY    . BLADDER SUSPENSION    . CARDIAC CATHETERIZATION    . CHOLECYSTECTOMY OPEN    . ESOPHAGOGASTRODUODENOSCOPY (  EGD) WITH PROPOFOL N/A 05/16/2014   Procedure: ESOPHAGOGASTRODUODENOSCOPY (EGD) WITH PROPOFOL with Balloon dilation;  Surgeon: Milus Banister, MD;  Location: [place];  Service: Endoscopy;  Laterality: N/A;  . ESOPHAGOGASTRODUODENOSCOPY (EGD) WITH PROPOFOL N/A 09/02/2015   Procedure: ESOPHAGOGASTRODUODENOSCOPY (EGD) WITH PROPOFOL;  Surgeon: Doran Stabler, MD;  Location: [place];  Service: Gastroenterology;  Laterality: N/A;  . FEMORAL-POPLITEAL BYPASS GRAFT  10/12/2011   Procedure: BYPASS GRAFT FEMORAL-POPLITEAL ARTERY;  Surgeon: Elam Dutch, MD;  Location: [facility] OR;  Service: Vascular;  Laterality: Left;  Left Femoral-Popliteal Bypass Graft using 93mm x 80cm Propaten Graft with intraop arteriogram times one.  . FEMORAL-POPLITEAL BYPASS GRAFT Left 04/17/2014   Procedure: REDO LEFT FEMORAL-POPLITEAL ARTERY BYPASS USING GORE PROPATEN 38mmx80cm GRAFT;  Surgeon: Elam Dutch, MD;  Location: [place];  Service: Vascular;  Laterality: Left;  . FOOT SURGERY Left    "bone spurs"  . INTRAOPERATIVE ARTERIOGRAM  10/12/2011   Procedure: INTRA OPERATIVE ARTERIOGRAM;  Surgeon: Elam Dutch, MD;  Location: [place];  Service: Vascular;  Laterality: Left;  . KNEE ARTHROSCOPY Bilateral    "cartilage"  . Laparascopic cryoablation of left kidney  08/2006   Dr. Gladis Riffle for renal cell cancer    . LEFT HEART CATHETERIZATION WITH CORONARY ANGIOGRAM N/A 05/11/2011   Procedure: LEFT HEART CATHETERIZATION WITH CORONARY ANGIOGRAM;  Surgeon: Jolaine Artist, MD;  Location: [facility] CATH LAB;  Service: Cardiovascular;  Laterality: N/A;  . LUNG SURGERY Right    lung nodule removed from the right side  . PR VEIN BYPASS GRAFT,AORTO-FEM-POP  05/27/2010  . SHOULDER ARTHROSCOPY Bilateral    'Spurs"  . TUBAL LIGATION      FAMILY HISTORY: Family History  Problem Relation Age of Onset  . Heart disease Mother   . Lung cancer Mother   . Cancer Mother        Lung  . Heart disease Father   . Lung cancer Father   . Cancer Father        Lung  . Heart disease Sister        CABG- Open Heart    SOCIAL HISTORY:  Social History   Social History  . Marital status: Widowed    Spouse name: N/A  . Number of children: 2  . Years of education: 8th grade   Occupational History  . Retired Not Employed   Social History Main Topics  . Smoking status: Current Every Day Smoker    Packs/day: 1.50    Years: 50.00    Types: Cigarettes    Last attempt to quit: 06/24/2014  . Smokeless tobacco: Never Used  . Alcohol use No  . Drug use: No  . Sexual activity: No   Other Topics Concern  . Not on file   Social History Narrative   Widow. Two sons. Husband died of lung cancer.     Right-handed.   She lives at home with her friend, [name] and her son.   2-4 bottles of Coke per day.              PHYSICAL EXAM   Vitals:   10/19/16 1000  BP: 109/67  Pulse: (!) 58  Weight: 171 lb 8 oz (77.8 kg)  Height: 5\' 1"  (1.549 m)    Not recorded      Body mass index is 32.4 kg/m.  PHYSICAL EXAMNIATION:  Gen: NAD, conversant, well nourised, obese, well groomed  Cardiovascular: Regular rate rhythm, no peripheral edema, warm, nontender. Eyes: Conjunctivae clear without exudates or hemorrhage Neck: Supple, no carotid bruits. Pulmonary: Clear to auscultation bilaterally    NEUROLOGICAL EXAM:  MENTAL STATUS: Speech:    Speech is normal; fluent and spontaneous with normal comprehension.  Cognition:     Orientation to time, place and person     Normal recent and remote memory     Normal Attention span and concentration     Normal Language, naming, repeating,spontaneous speech     Fund of knowledge   CRANIAL NERVES: CN II: Visual fields are full to confrontation. Fundoscopic exam is normal with sharp discs and no vascular changes. Pupils are round equal and briskly reactive to light. CN III, IV, VI: extraocular movement are normal. No ptosis. CN V: Facial sensation is intact to pinprick in all 3 divisions bilaterally. Corneal responses are intact.  CN VII: Face is symmetric with normal eye closure and smile. CN VIII: Hearing is normal to rubbing fingers CN IX, X: Palate elevates symmetrically. Phonation is normal. CN XI: Head turning and shoulder shrug are intact CN XII: Tongue is midline with normal movements and no atrophy.  MOTOR: There is no pronator drift of out-stretched arms. Muscle bulk and tone are normal. Muscle strength is normal.  REFLEXES: Reflexes are hypoactive and symmetric at the biceps, triceps, knees, and ankles. Plantar responses are flexor.  SENSORY: Length dependent decreased to  light touch, pinprick, and vibratory sensation to bilateral knee level   COORDINATION: Rapid alternating movements and fine finger movements are intact. There is no dysmetria on finger-to-nose and heel-knee-shin.    GAIT/STANCE: She need assistant to get up from seated position, cautious, mildly unsteady.    DIAGNOSTIC DATA (LABS, IMAGING, TESTING) - I reviewed patient records, labs, notes, testing and imaging myself where available.   ASSESSMENT AND PLAN  JULIZZA SASSONE is a 71 y.o. female   Gait abnormality   multifactorial, this including deconditioning, slow heart rate of 48 beats per minutes, diabetic peripheral neuropathy, low back  pain  Proceed with EMG nerve conduction study  Refer her to physical therapy  Echocardiogram   Marcial Pacas, M.D. Ph.D.  [facility] [address], [place], [place] [address] Ph: [phone] Fax: [phone]  CC: [name], MD

## 2016-10-20 ENCOUNTER — Telehealth: Payer: Self-pay | Admitting: Neurology

## 2016-10-20 NOTE — Telephone Encounter (Signed)
Called pt's contact Nehemiah Settle [phone] to schedule a NCV/EMG per dr Krista Blue for Margorie John. JBA

## 2016-10-30 ENCOUNTER — Emergency Department (HOSPITAL_COMMUNITY): Payer: Medicare Other

## 2016-10-30 ENCOUNTER — Observation Stay (HOSPITAL_COMMUNITY)
Admission: EM | Admit: 2016-10-30 | Discharge: 2016-11-01 | Disposition: A | Discharge status: Home / Self Care | Payer: Medicare Other | Attending: Internal Medicine | Admitting: Internal Medicine

## 2016-10-30 ENCOUNTER — Encounter (HOSPITAL_COMMUNITY): Payer: Self-pay | Admitting: Emergency Medicine

## 2016-10-30 DIAGNOSIS — I313 Pericardial effusion (noninflammatory): Secondary | ICD-10-CM | POA: Insufficient documentation

## 2016-10-30 DIAGNOSIS — M160 Bilateral primary osteoarthritis of hip: Secondary | ICD-10-CM | POA: Insufficient documentation

## 2016-10-30 DIAGNOSIS — I25110 Atherosclerotic heart disease of native coronary artery with unstable angina pectoris: Secondary | ICD-10-CM | POA: Insufficient documentation

## 2016-10-30 DIAGNOSIS — I2510 Atherosclerotic heart disease of native coronary artery without angina pectoris: Secondary | ICD-10-CM | POA: Diagnosis present

## 2016-10-30 DIAGNOSIS — K219 Gastro-esophageal reflux disease without esophagitis: Secondary | ICD-10-CM | POA: Insufficient documentation

## 2016-10-30 DIAGNOSIS — E785 Hyperlipidemia, unspecified: Secondary | ICD-10-CM | POA: Diagnosis not present

## 2016-10-30 DIAGNOSIS — N184 Chronic kidney disease, stage 4 (severe): Secondary | ICD-10-CM | POA: Insufficient documentation

## 2016-10-30 DIAGNOSIS — I6523 Occlusion and stenosis of bilateral carotid arteries: Secondary | ICD-10-CM | POA: Insufficient documentation

## 2016-10-30 DIAGNOSIS — R739 Hyperglycemia, unspecified: Secondary | ICD-10-CM | POA: Diagnosis present

## 2016-10-30 DIAGNOSIS — I10 Essential (primary) hypertension: Secondary | ICD-10-CM | POA: Diagnosis present

## 2016-10-30 DIAGNOSIS — D3502 Benign neoplasm of left adrenal gland: Secondary | ICD-10-CM | POA: Insufficient documentation

## 2016-10-30 DIAGNOSIS — M50223 Other cervical disc displacement at C6-C7 level: Secondary | ICD-10-CM | POA: Insufficient documentation

## 2016-10-30 DIAGNOSIS — Z794 Long term (current) use of insulin: Secondary | ICD-10-CM | POA: Insufficient documentation

## 2016-10-30 DIAGNOSIS — F329 Major depressive disorder, single episode, unspecified: Secondary | ICD-10-CM | POA: Diagnosis not present

## 2016-10-30 DIAGNOSIS — M47812 Spondylosis without myelopathy or radiculopathy, cervical region: Secondary | ICD-10-CM | POA: Insufficient documentation

## 2016-10-30 DIAGNOSIS — E119 Type 2 diabetes mellitus without complications: Secondary | ICD-10-CM

## 2016-10-30 DIAGNOSIS — E11621 Type 2 diabetes mellitus with foot ulcer: Secondary | ICD-10-CM | POA: Diagnosis not present

## 2016-10-30 DIAGNOSIS — T148XXA Other injury of unspecified body region, initial encounter: Secondary | ICD-10-CM | POA: Diagnosis present

## 2016-10-30 DIAGNOSIS — E1151 Type 2 diabetes mellitus with diabetic peripheral angiopathy without gangrene: Secondary | ICD-10-CM | POA: Insufficient documentation

## 2016-10-30 DIAGNOSIS — Z8541 Personal history of malignant neoplasm of cervix uteri: Secondary | ICD-10-CM | POA: Insufficient documentation

## 2016-10-30 DIAGNOSIS — Z90710 Acquired absence of both cervix and uterus: Secondary | ICD-10-CM | POA: Insufficient documentation

## 2016-10-30 DIAGNOSIS — N179 Acute kidney failure, unspecified: Secondary | ICD-10-CM | POA: Diagnosis not present

## 2016-10-30 DIAGNOSIS — Z86010 Personal history of colonic polyps: Secondary | ICD-10-CM | POA: Insufficient documentation

## 2016-10-30 DIAGNOSIS — R55 Syncope and collapse: Secondary | ICD-10-CM | POA: Diagnosis present

## 2016-10-30 DIAGNOSIS — M50221 Other cervical disc displacement at C4-C5 level: Secondary | ICD-10-CM | POA: Insufficient documentation

## 2016-10-30 DIAGNOSIS — E039 Hypothyroidism, unspecified: Secondary | ICD-10-CM | POA: Diagnosis not present

## 2016-10-30 DIAGNOSIS — Z85528 Personal history of other malignant neoplasm of kidney: Secondary | ICD-10-CM | POA: Insufficient documentation

## 2016-10-30 DIAGNOSIS — S32019A Unspecified fracture of first lumbar vertebra, initial encounter for closed fracture: Secondary | ICD-10-CM | POA: Diagnosis not present

## 2016-10-30 DIAGNOSIS — S2232XA Fracture of one rib, left side, initial encounter for closed fracture: Secondary | ICD-10-CM | POA: Insufficient documentation

## 2016-10-30 DIAGNOSIS — I129 Hypertensive chronic kidney disease with stage 1 through stage 4 chronic kidney disease, or unspecified chronic kidney disease: Secondary | ICD-10-CM | POA: Diagnosis not present

## 2016-10-30 DIAGNOSIS — F411 Generalized anxiety disorder: Secondary | ICD-10-CM | POA: Insufficient documentation

## 2016-10-30 DIAGNOSIS — E1165 Type 2 diabetes mellitus with hyperglycemia: Secondary | ICD-10-CM | POA: Insufficient documentation

## 2016-10-30 DIAGNOSIS — T07XXXA Unspecified multiple injuries, initial encounter: Secondary | ICD-10-CM | POA: Diagnosis present

## 2016-10-30 DIAGNOSIS — N183 Chronic kidney disease, stage 3 unspecified: Secondary | ICD-10-CM | POA: Diagnosis present

## 2016-10-30 DIAGNOSIS — J449 Chronic obstructive pulmonary disease, unspecified: Secondary | ICD-10-CM | POA: Diagnosis not present

## 2016-10-30 DIAGNOSIS — N189 Chronic kidney disease, unspecified: Secondary | ICD-10-CM

## 2016-10-30 DIAGNOSIS — F17210 Nicotine dependence, cigarettes, uncomplicated: Secondary | ICD-10-CM | POA: Diagnosis not present

## 2016-10-30 DIAGNOSIS — S32029A Unspecified fracture of second lumbar vertebra, initial encounter for closed fracture: Secondary | ICD-10-CM | POA: Diagnosis not present

## 2016-10-30 DIAGNOSIS — D649 Anemia, unspecified: Secondary | ICD-10-CM | POA: Insufficient documentation

## 2016-10-30 DIAGNOSIS — S32039A Unspecified fracture of third lumbar vertebra, initial encounter for closed fracture: Secondary | ICD-10-CM | POA: Insufficient documentation

## 2016-10-30 DIAGNOSIS — R296 Repeated falls: Secondary | ICD-10-CM | POA: Insufficient documentation

## 2016-10-30 DIAGNOSIS — E1122 Type 2 diabetes mellitus with diabetic chronic kidney disease: Secondary | ICD-10-CM | POA: Diagnosis not present

## 2016-10-30 DIAGNOSIS — I4510 Unspecified right bundle-branch block: Secondary | ICD-10-CM | POA: Insufficient documentation

## 2016-10-30 DIAGNOSIS — I700 Atherosclerosis of aorta: Secondary | ICD-10-CM | POA: Insufficient documentation

## 2016-10-30 DIAGNOSIS — I672 Cerebral atherosclerosis: Secondary | ICD-10-CM | POA: Insufficient documentation

## 2016-10-30 DIAGNOSIS — M5126 Other intervertebral disc displacement, lumbar region: Secondary | ICD-10-CM | POA: Insufficient documentation

## 2016-10-30 DIAGNOSIS — W010XXA Fall on same level from slipping, tripping and stumbling without subsequent striking against object, initial encounter: Secondary | ICD-10-CM | POA: Diagnosis not present

## 2016-10-30 DIAGNOSIS — I252 Old myocardial infarction: Secondary | ICD-10-CM | POA: Insufficient documentation

## 2016-10-30 DIAGNOSIS — S20229A Contusion of unspecified back wall of thorax, initial encounter: Secondary | ICD-10-CM | POA: Diagnosis not present

## 2016-10-30 DIAGNOSIS — Z9049 Acquired absence of other specified parts of digestive tract: Secondary | ICD-10-CM | POA: Insufficient documentation

## 2016-10-30 DIAGNOSIS — Z7902 Long term (current) use of antithrombotics/antiplatelets: Secondary | ICD-10-CM | POA: Insufficient documentation

## 2016-10-30 DIAGNOSIS — Z8673 Personal history of transient ischemic attack (TIA), and cerebral infarction without residual deficits: Secondary | ICD-10-CM | POA: Insufficient documentation

## 2016-10-30 DIAGNOSIS — I70219 Atherosclerosis of native arteries of extremities with intermittent claudication, unspecified extremity: Secondary | ICD-10-CM | POA: Insufficient documentation

## 2016-10-30 DIAGNOSIS — E1142 Type 2 diabetes mellitus with diabetic polyneuropathy: Secondary | ICD-10-CM | POA: Insufficient documentation

## 2016-10-30 DIAGNOSIS — S32049A Unspecified fracture of fourth lumbar vertebra, initial encounter for closed fracture: Secondary | ICD-10-CM | POA: Diagnosis not present

## 2016-10-30 DIAGNOSIS — G894 Chronic pain syndrome: Secondary | ICD-10-CM | POA: Diagnosis present

## 2016-10-30 DIAGNOSIS — I340 Nonrheumatic mitral (valve) insufficiency: Secondary | ICD-10-CM | POA: Insufficient documentation

## 2016-10-30 DIAGNOSIS — M4802 Spinal stenosis, cervical region: Secondary | ICD-10-CM | POA: Insufficient documentation

## 2016-10-30 DIAGNOSIS — K589 Irritable bowel syndrome without diarrhea: Secondary | ICD-10-CM | POA: Insufficient documentation

## 2016-10-30 DIAGNOSIS — M797 Fibromyalgia: Secondary | ICD-10-CM | POA: Insufficient documentation

## 2016-10-30 DIAGNOSIS — K5730 Diverticulosis of large intestine without perforation or abscess without bleeding: Secondary | ICD-10-CM | POA: Insufficient documentation

## 2016-10-30 DIAGNOSIS — S32009A Unspecified fracture of unspecified lumbar vertebra, initial encounter for closed fracture: Secondary | ICD-10-CM

## 2016-10-30 HISTORY — DX: Disorder of kidney and ureter, unspecified: N28.9

## 2016-10-30 LAB — CBC WITH DIFFERENTIAL/PLATELET
Basophils Absolute: 0.1 10*3/uL (ref 0.0–0.1)
Basophils Relative: 1 %
EOS ABS: 0.1 10*3/uL (ref 0.0–0.7)
Eosinophils Relative: 1 %
HEMATOCRIT: 34.7 % — AB (ref 36.0–46.0)
HEMOGLOBIN: 11.8 g/dL — AB (ref 12.0–15.0)
LYMPHS ABS: 1.6 10*3/uL (ref 0.7–4.0)
Lymphocytes Relative: 16 %
MCH: 29.1 pg (ref 26.0–34.0)
MCHC: 34 g/dL (ref 30.0–36.0)
MCV: 85.7 fL (ref 78.0–100.0)
MONOS PCT: 5 %
Monocytes Absolute: 0.5 10*3/uL (ref 0.1–1.0)
NEUTROS PCT: 77 %
Neutro Abs: 7.4 10*3/uL (ref 1.7–7.7)
Platelets: 270 10*3/uL (ref 150–400)
RBC: 4.05 MIL/uL (ref 3.87–5.11)
RDW: 14.8 % (ref 11.5–15.5)
WBC: 9.6 10*3/uL (ref 4.0–10.5)

## 2016-10-30 LAB — BASIC METABOLIC PANEL
Anion gap: 11 (ref 5–15)
BUN: 26 mg/dL — AB (ref 6–20)
CHLORIDE: 98 mmol/L — AB (ref 101–111)
CO2: 26 mmol/L (ref 22–32)
CREATININE: 1.76 mg/dL — AB (ref 0.44–1.00)
Calcium: 8.9 mg/dL (ref 8.9–10.3)
GFR calc Af Amer: 33 mL/min — ABNORMAL LOW (ref >60)
GFR calc non Af Amer: 28 mL/min — ABNORMAL LOW (ref >60)
Glucose, Bld: 330 mg/dL — ABNORMAL HIGH (ref 65–99)
Potassium: 3.6 mmol/L (ref 3.5–5.1)
Sodium: 135 mmol/L (ref 135–145)

## 2016-10-30 LAB — CBG MONITORING, ED
GLUCOSE-CAPILLARY: 212 mg/dL — AB (ref 65–99)
Glucose-Capillary: 354 mg/dL — ABNORMAL HIGH (ref 65–99)

## 2016-10-30 LAB — URINALYSIS, ROUTINE W REFLEX MICROSCOPIC
BACTERIA UA: NONE SEEN
BILIRUBIN URINE: NEGATIVE
Glucose, UA: >=500 mg/dL — AB
Ketones, ur: NEGATIVE mg/dL
LEUKOCYTES UA: NEGATIVE
Nitrite: NEGATIVE
Protein, ur: 30 mg/dL — AB
Specific Gravity, Urine: 1.016 (ref 1.005–1.030)
pH: 5 (ref 5.0–8.0)

## 2016-10-30 LAB — TROPONIN I: Troponin I: <0.03 ng/mL (ref <0.03)

## 2016-10-30 MED ORDER — OXYCODONE-ACETAMINOPHEN 5-325 MG PO TABS
1.0000 | ORAL_TABLET | Freq: Once | ORAL | Status: AC
  Administered 2016-10-31: 1 via ORAL
  Filled 2016-10-30: qty 1

## 2016-10-30 MED ORDER — MORPHINE SULFATE (PF) 4 MG/ML IV SOLN
4.0000 mg | Freq: Once | INTRAVENOUS | Status: AC
  Administered 2016-10-30: 4 mg via INTRAVENOUS
  Filled 2016-10-30: qty 1

## 2016-10-30 MED ORDER — MORPHINE SULFATE (PF) 4 MG/ML IV SOLN
4.0000 mg | Freq: Once | INTRAVENOUS | Status: AC
  Administered 2016-10-31: 4 mg via INTRAVENOUS
  Filled 2016-10-30: qty 1

## 2016-10-30 MED ORDER — SODIUM CHLORIDE 0.9 % IV BOLUS (SEPSIS)
1000.0000 mL | Freq: Once | INTRAVENOUS | Status: AC
  Administered 2016-10-30: 1000 mL via INTRAVENOUS

## 2016-10-30 MED ORDER — IOPAMIDOL (ISOVUE-300) INJECTION 61%
INTRAVENOUS | Status: AC
  Filled 2016-10-30: qty 30

## 2016-10-30 MED ORDER — IOPAMIDOL (ISOVUE-300) INJECTION 61%
30.0000 mL | Freq: Once | INTRAVENOUS | Status: AC | PRN
  Administered 2016-10-30: 30 mL via ORAL

## 2016-10-30 MED ORDER — INSULIN ASPART 100 UNIT/ML ~~LOC~~ SOLN
5.0000 [IU] | Freq: Once | SUBCUTANEOUS | Status: AC
  Administered 2016-10-30: 5 [IU] via SUBCUTANEOUS
  Filled 2016-10-30: qty 1

## 2016-10-30 NOTE — ED Notes (Signed)
Bed: [facility] Expected date:  Expected time:  Means of arrival:  Comments: EMS 71 y/o fall

## 2016-10-30 NOTE — ED Triage Notes (Addendum)
Pt from home following 2 falls today. Pt denies head injuries or LOC. Pt states her legs gave out. Pt states she was treated for MRSA 2 weeks ago when she was seen at [facility] for her fall. Pt reports intermittent dizziness. Pt reports left shoulder pain, left flank pain and hyperglycemia. Pt has hx of chronic kidney disease

## 2016-10-30 NOTE — ED Notes (Signed)
Asked pt to ambulate in the hallway pt states she doesn't feel like she can walk right now.

## 2016-10-30 NOTE — ED Provider Notes (Signed)
[name] DEPT Provider Note   CSN: [id number] Arrival date & time: 10/30/16  1641     History   Chief Complaint Chief Complaint  Patient presents with  . Fall  . Back Pain  . Shoulder Pain    left   . Hyperglycemia    HPI April Branch is a 71 y.o. female.  HPI    71 year old female with hx of CAD, kidney cancer, anemia, arthritis, anxiety, diabetes, fibromyalgias, prior stroke brought here via EMS from home for evaluation of falls.  Pt sts for the past several weeks she has had recurrent bouts of dizziness which she described more as lightheadedness, usually brought on by positional changes.  She fell 4 days ago when she was walking around a corner and felt dizzy, fell and scuffed up her R elbow and LLE.  No LOC.  Today she fell 3 separate times.  One time when she was outside in the yard trying to stop her dog from barking, when she turn and fell.  She was having difficulty getting up.  She later fell when tripped over a flower pot as she was trying to let her home health nurse in to the house.  Her nurse called EMS.  Pt currently report 10/10 pain to L side of neck, and her lower back.  Pain is sharp, worse with movement.  No report of significant headache, confusion, cp, trouble breathing, abd pain, dysuria, focal numbness or weakness or confusion.  Denies alcohol abuse.  Pt sts she felt her sxs may be due to her medications but unable to tell me if she has any recent medication changes.  Pt is a poor historian.    Past Medical History:  Diagnosis Date  . Anemia   . Anginal pain ([place])    1 MONTH AGO   . Anxiety   . Arthritis   . CAD (coronary artery disease)   . CAD (coronary artery disease) of bypass graft   . 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 Colon polyps 09/11/2010   Tubular adenoma  . COPD 12/14/2008   PATIENT DENIES    . Depression   . Diabetes mellitus age 58   insulin dependent  . Diabetic coma ([place]) Feb. 2014    . Fall at home Jan. 2, 2016  Jan. 13, 2016   Left knee  . Fibromyalgia   . GERD 07/20/2006  . GERD (gastroesophageal reflux disease)   . Headache(784.0)   . History of transient ischemic attack (TIA)   . HPV (human papilloma virus) infection   . Hyperlipidemia   . Hypertension   . Hypothyroidism   . Insulin dependent type 2 diabetes mellitus, controlled ([place])   . Lumbar disc disease   . Myocardial infarction ([place])    AGE 71    . Neuromuscular disorder ([place])    PERIPHERAL NEUROPATHY  . Peripheral vascular disease ([place])   . Personal history of malignant neoplasm of kidney(V10.52) 12/14/2008   Laparoscopic biopsy and cryoablation 7/08 Dr. Vernie Shanks   . Renal disorder    chronic kidney disease   . Stroke [facility])    3 MINI STROKES  RT SIDED WEAKNESS  . Vertigo     Patient Active Problem List   Diagnosis Date Noted  . Gait abnormality 10/19/2016  . Diabetic peripheral neuropathy ([place]) 10/19/2016  . Symptomatic bradycardia 10/13/2016  . Elevated troponin   . Bradycardia 10/12/2016  . General weakness 10/12/2016  .  CKD (chronic kidney disease), stage III 10/12/2016  . Weakness   . Orthostatic syncope 05/10/2016  . Colitis 05/10/2016  . Anemia 05/10/2016  . Hypomagnesemia 05/10/2016  . Food impaction of esophagus 09/02/2015  . Diabetic foot ulcer ([place]) 06/24/2015  . Atheroscler of bypass graft of left leg with intermittent claudication ([place]) 04/17/2014  . Carotid stenosis 05/19/2013  . Acute kidney injury (nontraumatic) ([place]) 09/21/2012  . Chronic pain syndrome 03/13/2012  . Acute kidney injury ([place]) 03/13/2012  . Atherosclerosis of native arteries of the extremities with intermittent claudication 10/08/2011  . PVD (peripheral vascular disease) ([place]) 07/30/2011  . CAD (coronary artery disease) 05/10/2011  . Hyperlipidemia   . Unstable angina ([place]) 05/09/2011  . CERVICAL CANCER 12/14/2008  . Personal history of malignant neoplasm of kidney(V10.52) 12/14/2008  . COPD  (chronic obstructive pulmonary disease) ([place]) 12/14/2008  . IBS 12/14/2008  . Essential hypertension   . Personal history of colonic polyps 08/13/2008  . DISORDER, TOBACCO USE 08/04/2006  . Hypothyroidism   . Insulin dependent type 2 diabetes mellitus, controlled ([place])   . Lumbar disc disease   . ANXIETY STATE NOS 07/20/2006  . GERD 07/20/2006  . HX, PERSONAL, VENOUS THROMBOSIS/EMBOLISM 07/20/2006    Past Surgical History:  Procedure Laterality Date  . ABDOMINAL AORTAGRAM N/A 08/21/2011   Procedure: ABDOMINAL Maxcine Ham;  Surgeon: Elam Dutch, MD;  Location: [facility] CATH LAB;  Service: Cardiovascular;  Laterality: N/A;  . ABDOMINAL AORTAGRAM N/A 03/16/2014   Procedure: ABDOMINAL Maxcine Ham;  Surgeon: Elam Dutch, MD;  Location: [facility] CATH LAB;  Service: Cardiovascular;  Laterality: N/A;  . ABDOMINAL HYSTERECTOMY    . APPENDECTOMY    . BLADDER SUSPENSION    . CARDIAC CATHETERIZATION    . CHOLECYSTECTOMY OPEN    . ESOPHAGOGASTRODUODENOSCOPY (EGD) WITH PROPOFOL N/A 05/16/2014   Procedure: ESOPHAGOGASTRODUODENOSCOPY (EGD) WITH PROPOFOL with Balloon dilation;  Surgeon: Milus Banister, MD;  Location: [place];  Service: Endoscopy;  Laterality: N/A;  . ESOPHAGOGASTRODUODENOSCOPY (EGD) WITH PROPOFOL N/A 09/02/2015   Procedure: ESOPHAGOGASTRODUODENOSCOPY (EGD) WITH PROPOFOL;  Surgeon: Doran Stabler, MD;  Location: [place];  Service: Gastroenterology;  Laterality: N/A;  . FEMORAL-POPLITEAL BYPASS GRAFT  10/12/2011   Procedure: BYPASS GRAFT FEMORAL-POPLITEAL ARTERY;  Surgeon: Elam Dutch, MD;  Location: [facility] OR;  Service: Vascular;  Laterality: Left;  Left Femoral-Popliteal Bypass Graft using 65mm x 80cm Propaten Graft with intraop arteriogram times one.  . FEMORAL-POPLITEAL BYPASS GRAFT Left 04/17/2014   Procedure: REDO LEFT FEMORAL-POPLITEAL ARTERY BYPASS USING GORE PROPATEN 30mmx80cm GRAFT;  Surgeon: Elam Dutch, MD;  Location: [place];  Service: Vascular;  Laterality: Left;  .  FOOT SURGERY Left    "bone spurs"  . INTRAOPERATIVE ARTERIOGRAM  10/12/2011   Procedure: INTRA OPERATIVE ARTERIOGRAM;  Surgeon: Elam Dutch, MD;  Location: [place];  Service: Vascular;  Laterality: Left;  . KNEE ARTHROSCOPY Bilateral    "cartilage"  . Laparascopic cryoablation of left kidney  08/2006   Dr. Gladis Riffle for renal cell cancer  . LEFT HEART CATHETERIZATION WITH CORONARY ANGIOGRAM N/A 05/11/2011   Procedure: LEFT HEART CATHETERIZATION WITH CORONARY ANGIOGRAM;  Surgeon: Jolaine Artist, MD;  Location[facility] CATH LAB;  Service: Cardiovascular;  Laterality: N/A;  . LUNG SURGERY Right    lung nodule removed from the right side  . PR VEIN BYPASS GRAFT,AORTO-FEM-POP  05/27/2010  . SHOULDER ARTHROSCOPY Bilateral    'Spurs"  . TUBAL LIGATION      OB History    No data available  Home Medications    Prior to Admission medications   Medication Sig Start Date End Date Taking? Authorizing Provider  ACCU-CHEK FASTCLIX LANCETS MISC Use 4 per day to check blood sugar dx code E11.9 11/29/14   Elayne Snare, MD  cilostazol (PLETAL) 100 MG tablet Take 1 tablet (100 mg total) by mouth 2 (two) times daily before a meal. 03/17/16   Elam Dutch, MD  clopidogrel (PLAVIX) 75 MG tablet Take 75 mg by mouth daily.  04/02/14   [provider]  DULoxetine (CYMBALTA) 60 MG capsule Take 1 capsule (60 mg total) by mouth daily. 03/19/16   Elayne Snare, MD  ferrous sulfate 325 (65 FE) MG tablet Take 325 mg by mouth 2 (two) times daily with a meal.    [provider]  gabapentin (NEURONTIN) 100 MG capsule Take 200 mg by mouth at bedtime.  01/07/15   [provider]  HYDROcodone-acetaminophen (NORCO) 10-325 MG tablet Take 1 tablet by mouth every 4 (four) hours as needed for moderate pain or severe pain. Patient taking differently: Take 1 tablet by mouth 3 (three) times daily as needed (for pain).  06/28/15   Elwin Mocha, MD  hydroxypropyl methylcellulose / hypromellose (ISOPTO  TEARS / GONIOVISC) 2.5 % ophthalmic solution Place 1 drop into both eyes 4 (four) times daily as needed for dry eyes.    [provider]  Insulin Glargine (TOUJEO SOLOSTAR) 300 UNIT/ML SOPN Inject 30 Units into the skin daily. 10/15/16   Patrecia Pour, MD  insulin lispro (HUMALOG KWIKPEN) 100 UNIT/ML KiwkPen INJECT 5 UNITS 3 TIMES A DAY AS NEEDED FOR BLOOD SUGAR 10/15/16   Patrecia Pour, MD  Insulin Pen Needle (BD PEN NEEDLE NANO U/F) 32G X 4 MM MISC Use to inject insulin 10/30/14   Elayne Snare, MD  lansoprazole (PREVACID) 30 MG capsule Take 1 capsule (30 mg total) by mouth daily. 05/11/16   Geradine Girt, DO  levothyroxine (SYNTHROID, LEVOTHROID) 137 MCG tablet Take 1 tablet (137 mcg total) by mouth daily before breakfast. 10/15/16   Patrecia Pour, MD  LORazepam (ATIVAN) 1 MG tablet Take 1 tablet (1 mg total) by mouth 3 times/day as needed-between meals & bedtime for anxiety. 06/28/15   Elwin Mocha, MD  metFORMIN (GLUCOPHAGE-XR) 750 MG 24 hr tablet TAKE 2 TABLETS BY MOUTH EVERY DAY Patient taking differently: TAKE 1500 MG BY MOUTH EVERY DAY 09/09/16   Elayne Snare, MD  nortriptyline (PAMELOR) 75 MG capsule Take 75 mg by mouth at bedtime. 01/24/14   [provider]  potassium chloride (KLOR-CON 10) 10 MEQ tablet Take 1 tablet (10 mEq total) by mouth daily. 03/19/16   Elayne Snare, MD  pravastatin (PRAVACHOL) 20 MG tablet Take 20 mg by mouth daily.    [provider]  QUEtiapine (SEROQUEL) 100 MG tablet Take 100 mg by mouth at bedtime. 09/25/16   [provider]  tolterodine (DETROL LA) 4 MG 24 hr capsule Take 4 mg by mouth daily.     [provider]  trimethoprim-polymyxin b (POLYTRIM) ophthalmic solution Place 1 drop into the right eye every 4 (four) hours. 10/05/16   Larene Pickett, PA-C  Vitamin D, Ergocalciferol, (DRISDOL) 50000 units CAPS capsule Take 50,000 Units by mouth once a week. 09/18/16   [provider]    Family History Family History    Problem Relation Age of Onset  . Heart disease Mother   . Lung cancer Mother   . Cancer Mother  Lung  . Heart disease Father   . Lung cancer Father   . Cancer Father        Lung  . Heart disease Sister        CABG- Open Heart    Social History Social History  Substance Use Topics  . Smoking status: Current Every Day Smoker    Packs/day: 1.50    Years: 50.00    Types: Cigarettes    Last attempt to quit: 06/24/2014  . Smokeless tobacco: Never Used  . Alcohol use No     Allergies   Patient has no known allergies.   Review of Systems Review of Systems  All other systems reviewed and are negative.    Physical Exam Updated Vital Signs BP (!) 183/80 (BP Location: Right Arm)   Pulse (!) 57   Temp 99.3 F (37.4 C) (Oral)   Resp 16   SpO2 99%   Physical Exam  Constitutional: She is oriented to person, place, and time. She appears well-developed and well-nourished. No distress.  Obese female, appear disheveled, resting in bed in NAD.  HENT:  Head: Atraumatic.  No scalp tenderness, no mid face tenderness.  Eyes: Conjunctivae are normal.  Neck: Neck supple.  Tenderness to L paracervical spinal musculature without significant midline spine tenderness  Cardiovascular: Normal rate and regular rhythm.   Pulmonary/Chest: Effort normal and breath sounds normal. No respiratory distress. She has no wheezes.  Abdominal: Soft. Bowel sounds are normal. She exhibits no distension. There is no tenderness.  Musculoskeletal: She exhibits tenderness (tenderness to lumbar spine at the level of L3-S1 ).  Able to move all 4 extremities.    Neurological: She is alert and oriented to person, place, and time.  Skin: No rash noted.  Skin abrasions to R posterior elbow, and L anterior knee and lower leg.    Psychiatric: She has a normal mood and affect.  Nursing note and vitals reviewed.    ED Treatments / Results  Labs (all labs ordered are listed, but only abnormal results are  displayed) Labs Reviewed  CBC WITH DIFFERENTIAL/PLATELET - Abnormal; Notable for the following:       Result Value   Hemoglobin 11.8 (*)    HCT 34.7 (*)    All other components within normal limits  BASIC METABOLIC PANEL - Abnormal; Notable for the following:    Chloride 98 (*)    Glucose, Bld 330 (*)    BUN 26 (*)    Creatinine, Ser 1.76 (*)    GFR calc non Af Amer 28 (*)    GFR calc Af Amer 33 (*)    All other components within normal limits  URINALYSIS, ROUTINE W REFLEX MICROSCOPIC - Abnormal; Notable for the following:    Glucose, UA >=500 (*)    Hgb urine dipstick MODERATE (*)    Protein, ur 30 (*)    Squamous Epithelial / LPF 0-5 (*)    All other components within normal limits  CBG MONITORING, ED - Abnormal; Notable for the following:    Glucose-Capillary 354 (*)    All other components within normal limits  CBG MONITORING, ED - Abnormal; Notable for the following:    Glucose-Capillary 212 (*)    All other components within normal limits  TROPONIN I    EKG  EKG Interpretation  Date/Time:  Friday October 30 2016 18:51:03 EDT Ventricular Rate:  56 PR Interval:    QRS Duration: 113 QT Interval:  506 QTC Calculation: 489 R Axis:  77 Text Interpretation:  Sinus rhythm Atrial premature complex Short PR interval IRBBB and LPFB Low voltage, precordial leads Nonspecific T abnormalities, lateral leads Borderline prolonged QT interval When comapred to prior, no si9gnificant changes seen.  No STEMI Confirmed by Antony Blackbird [phone]) on 10/30/2016 11:19:44 PM     ED ECG REPORT   Date: 10/30/2016  Rate: 56  Rhythm: sinus bradycardia  QRS Axis: normal  Intervals: PR shortened and QT prolonged  ST/T Wave abnormalities: nonspecific T wave changes  Conduction Disutrbances:right bundle branch block and left posterior fascicular block  Narrative Interpretation:   Old EKG Reviewed: unchanged  I have personally reviewed the EKG tracing and agree with the computerized  printout as noted.   Radiology Ct Abdomen Pelvis Wo Contrast  Result Date: 10/30/2016 CLINICAL DATA:  Abdominal pain.  Two falls today. EXAM: CT ABDOMEN AND PELVIS WITHOUT CONTRAST TECHNIQUE: Multidetector CT imaging of the abdomen and pelvis was performed following the standard protocol without IV contrast. COMPARISON:  CT 09/09/2013 FINDINGS: Lower chest: Multi chamber cardiomegaly. Small pericardial effusion. No pleural fluid or consolidation. Nondisplaced fracture of lateral left twelfth rib. Hepatobiliary: Homogeneous attenuation of hepatic parenchyma without focal lesion or evidence of injury. No perihepatic fluid. Gallbladder physiologically distended, no calcified stone. No biliary dilatation. Pancreas: No ductal dilatation or inflammation. Spleen: Normal in size without focal abnormality. Splenule at the hilum. No perisplenic fluid. Adrenals/Urinary Tract: Low-density left adrenal adenoma. Right adrenal gland is normal. Mild perinephric edema about both kidneys, right greater than left, nonspecific. Cortical scarring in the mid left kidney with calcifications. Urinary bladder is physiologically distended. Stomach/Bowel: Descending and sigmoid colonic diverticulosis without diverticulitis. Small to moderate colonic stool burden. The appendix is surgically absent. Single prominent small bowel loops in the central abdomen is likely spurious, no evidence of obstruction. No bowel inflammation. Vascular/Lymphatic: Dense aortic and branch atherosclerosis. No aneurysm. No abdominal or pelvic adenopathy. Probable left femoral bypass graft incompletely assessed without contrast. Reproductive: Status post hysterectomy. No adnexal masses. Other: Subcutaneous soft tissue edema in the posterior left flank/back. Multiple rounded subcutaneous densities in the region of edema likely hematoma in setting of fall. No intra- abdominal ascites or free air. No retroperitoneal fluid. Musculoskeletal: Nondisplaced left  posterior twelfth rib fracture. Mildly displaced left L1 through L4 transverse process fractures. Vertebral body heights are normal. Bony pelvis is intact. IMPRESSION: 1. Left L1 through L4 transverse process fractures and posterior left nondisplaced twelfth rib fracture. 2. Hematoma and contusion in the left flank/back subcutaneous tissues. 3. No evidence of intra-abdominal/pelvic abnormality. 4. Colonic diverticulosis.  Aortic Atherosclerosis (ICD10-I70.0). Electronically Signed   By: Jeb Levering M.D.   On: 10/30/2016 22:59   Dg Chest 2 View  Result Date: 10/30/2016 CLINICAL DATA:  Two falls today. Intermittent dizziness. Clinical concern for occult infection. EXAM: CHEST  2 VIEW COMPARISON:  Radiograph 10/12/2016 FINDINGS: The cardiomediastinal contours are unchanged with stable cardiomegaly. Right perihilar scarring is stable. Pulmonary vasculature is normal. No consolidation, pleural effusion, or pneumothorax. No acute osseous abnormalities are seen. IMPRESSION: Stable cardiomegaly. No acute abnormality. No evidence of intrathoracic infection. Electronically Signed   By: Jeb Levering M.D.   On: 10/30/2016 19:37   Dg Lumbar Spine Complete  Result Date: 10/30/2016 CLINICAL DATA:  Pain after a fall EXAM: LUMBAR SPINE - COMPLETE 4+ VIEW COMPARISON:  None. FINDINGS: Surgical clips in the right upper quadrant. Lateral lumbar alignment within normal limits. Mild dextroscoliosis. Vertebral body heights are maintained. Mild diffuse degenerative changes. Aortic atherosclerosis. IMPRESSION: Mild scoliosis and  degenerative changes. No definite acute osseous abnormality. Electronically Signed   By: Donavan Foil M.D.   On: 10/30/2016 18:05   Ct Head Wo Contrast  Result Date: 10/30/2016 CLINICAL DATA:  Two falls today. Recent treatment for MRSA. Intermittent dizziness. EXAM: CT HEAD WITHOUT CONTRAST CT CERVICAL SPINE WITHOUT CONTRAST TECHNIQUE: Multidetector CT imaging of the head and cervical spine was  performed following the standard protocol without intravenous contrast. Multiplanar CT image reconstructions of the cervical spine were also generated. COMPARISON:  Head CT dated 10/12/2016.  Brain MRI dated 08/31/2016. FINDINGS: CT HEAD FINDINGS Brain: There is mild generalized age-related parenchymal atrophy with commensurate dilatation of the ventricles and sulci. There are patchy areas of low density within the bilateral periventricular and subcortical white matter regions, compatible with chronic small vessel ischemic changes described on earlier MRI. There is no mass, hemorrhage, edema or other evidence of acute parenchymal abnormality. No extra-axial hemorrhage. Vascular: There are chronic calcified atherosclerotic changes of the large vessels at the skull base. No unexpected hyperdense vessel. Skull: Normal. Negative for fracture or focal lesion. Sinuses/Orbits: No acute finding. Other: None. CT CERVICAL SPINE FINDINGS Alignment: No evidence of acute vertebral body subluxation. Straightening of the normal cervical lordosis is likely related to underlying degenerative change. Skull base and vertebrae: No fracture line or displaced fracture fragment identified. Facet joints appear normally aligned. Soft tissues and spinal canal: No prevertebral fluid or swelling. No visible canal hematoma. Disc levels: Mild disc desiccations in the mid and lower cervical spine, as manifested by disc space narrowings and mild osseous spurring. Associated mild disc bulges at the C3-4 through C6-7 levels. At C4-5, the disc bulge is causing moderate central canal stenosis. No more than mild central canal stenosis at the remainder of the levels. Additional degenerative hypertrophy of the uncovertebral and facet joints are causing moderate right neural foramen stenosis and mild left neural foramen stenosis at C4-5, with possible associated nerve root impingement. Upper chest: No acute findings. Other: Bilateral carotid  atherosclerosis. IMPRESSION: 1. No acute intracranial abnormality. No intracranial mass, hemorrhage or edema. No skull fracture. Chronic small vessel ischemic changes within the white matter. 2. No fracture or acute subluxation within the cervical spine. Degenerative changes of the cervical spine, mild to moderate in degree, as detailed above. 3. Carotid atherosclerosis. Electronically Signed   By: Franki Cabot M.D.   On: 10/30/2016 18:22   Ct Cervical Spine Wo Contrast  Result Date: 10/30/2016 CLINICAL DATA:  Two falls today. Recent treatment for MRSA. Intermittent dizziness. EXAM: CT HEAD WITHOUT CONTRAST CT CERVICAL SPINE WITHOUT CONTRAST TECHNIQUE: Multidetector CT imaging of the head and cervical spine was performed following the standard protocol without intravenous contrast. Multiplanar CT image reconstructions of the cervical spine were also generated. COMPARISON:  Head CT dated 10/12/2016.  Brain MRI dated 08/31/2016. FINDINGS: CT HEAD FINDINGS Brain: There is mild generalized age-related parenchymal atrophy with commensurate dilatation of the ventricles and sulci. There are patchy areas of low density within the bilateral periventricular and subcortical white matter regions, compatible with chronic small vessel ischemic changes described on earlier MRI. There is no mass, hemorrhage, edema or other evidence of acute parenchymal abnormality. No extra-axial hemorrhage. Vascular: There are chronic calcified atherosclerotic changes of the large vessels at the skull base. No unexpected hyperdense vessel. Skull: Normal. Negative for fracture or focal lesion. Sinuses/Orbits: No acute finding. Other: None. CT CERVICAL SPINE FINDINGS Alignment: No evidence of acute vertebral body subluxation. Straightening of the normal cervical lordosis is likely related to  underlying degenerative change. Skull base and vertebrae: No fracture line or displaced fracture fragment identified. Facet joints appear normally aligned.  Soft tissues and spinal canal: No prevertebral fluid or swelling. No visible canal hematoma. Disc levels: Mild disc desiccations in the mid and lower cervical spine, as manifested by disc space narrowings and mild osseous spurring. Associated mild disc bulges at the C3-4 through C6-7 levels. At C4-5, the disc bulge is causing moderate central canal stenosis. No more than mild central canal stenosis at the remainder of the levels. Additional degenerative hypertrophy of the uncovertebral and facet joints are causing moderate right neural foramen stenosis and mild left neural foramen stenosis at C4-5, with possible associated nerve root impingement. Upper chest: No acute findings. Other: Bilateral carotid atherosclerosis. IMPRESSION: 1. No acute intracranial abnormality. No intracranial mass, hemorrhage or edema. No skull fracture. Chronic small vessel ischemic changes within the white matter. 2. No fracture or acute subluxation within the cervical spine. Degenerative changes of the cervical spine, mild to moderate in degree, as detailed above. 3. Carotid atherosclerosis. Electronically Signed   By: Franki Cabot M.D.   On: 10/30/2016 18:22   Dg Shoulder Left  Result Date: 10/30/2016 CLINICAL DATA:  Fall EXAM: LEFT SHOULDER - 2+ VIEW COMPARISON:  None. FINDINGS: No fracture or dislocation. AC joint degenerative change. Left lung apex is clear IMPRESSION: No acute osseous abnormality. Electronically Signed   By: Donavan Foil M.D.   On: 10/30/2016 18:03    Procedures Procedures (including critical care time)  Medications Ordered in ED Medications  iopamidol (ISOVUE-300) 61 % injection (not administered)  oxyCODONE-acetaminophen (PERCOCET/ROXICET) 5-325 MG per tablet 1 tablet (not administered)  morphine 4 MG/ML injection 4 mg (4 mg Intravenous Given 10/30/16 1829)  sodium chloride 0.9 % bolus 1,000 mL (1,000 mLs Intravenous New Bag/Given 10/30/16 1828)  insulin aspart (novoLOG) injection 5 Units (5 Units  Subcutaneous Given 10/30/16 1859)  morphine 4 MG/ML injection 4 mg (4 mg Intravenous Given 10/31/16 0009)  iopamidol (ISOVUE-300) 61 % injection 30 mL (30 mLs Oral Contrast Given 10/30/16 2130)     Initial Impression / Assessment and Plan / ED Course  I have reviewed the triage vital signs and the nursing notes.  Pertinent labs & imaging results that were available during my care of the patient were reviewed by me and considered in my medical decision making (see chart for details).     BP (!) 146/73   Pulse (!) 56   Temp 99.3 F (37.4 C) (Oral)   Resp 20   SpO2 99%    Final Clinical Impressions(s) / ED Diagnoses   Final diagnoses:  Closed fracture of transverse process of lumbar vertebra, initial encounter (HCC)  Closed fracture of one rib of left side, initial encounter  Hyperglycemia  Recurrent falls  Multiple contusions    New Prescriptions New Prescriptions   No medications on file   5:36 PM Pt with recurrent falls today.  Report feeling dizzy/lightheadeness.  Hx of diabetes and multiple other comorbidities.  Will obtain appropriate imaging.  Labs ordered, work up initiated.   8:54 PM UA showing moderate Hgb, no evidence of infection.  Pt does have hx of kidney cancer, but given her fall and back pain, will obtain abd/pelvis CT for further evaluation.  Pt's Cr. 1.76, will perform CT without CM.  Her CBG is 330, insulin and IVF given.  Will check CBG. CXR without acute abnormality, head/cervical spine CT without acute finding. L shoulder and Lspine are negative.  EKG  showing sinus bradycardia with RBBB, LPFB, and shorten PR, borderline QT.  It appeared unchanged from prior.   11:21 PM CBG improves after patient receiving IV fluid and insulin. Now it is 212. No anion gap. Her urine shows evidence of hemoglobin therefore I did obtain abdominal and pelvis CT scan. CT scan demonstrated L1-L4 transverse process fracture and posterior left nondisplaced 12th rib fracture. Hematoma  and contusion in the left flank and back subcutaneous tissue were noted. On skin exam patient does have very faint contusion to the affected area.appreciate consultation from nurse practitioner, Joelene Millin, who works for neurosurgeon Dr. Saintclair Halsted. She acknowledged the CT finding. She mentioned that TLSO can be used as needed if patient wants full support. She does not have to follow-up with neurosurgeon, and can follow-up with her primary care Dr. For further management.  Since patient still have moderate tenderness on exam, evidence of hyperglycemia,and also having rib fracture and she is at home by herself, she does love her comfortable going home tonight. Plan to consult medicine for admission for further management.  Care discussed with Dr. Sherry Ruffing.  Doubt stroke.    12:19 AM Appreciate consultation with [facility] hospitalist Dr. Roel Cluck who agrees to see and admit pt for further management of her condition.  Pt may benefit from brain MRI due to recurrent falls.  She will likely needing PT/OT, pain management and close monitor of her contusions and blood in urine.  Pt is currently on Plavix, which may explains her bleeding.     Domenic Moras, PA-C 10/31/16 0022    Tegeler, Gwenyth Allegra, MD 11/05/16 1014

## 2016-10-31 ENCOUNTER — Observation Stay (HOSPITAL_BASED_OUTPATIENT_CLINIC_OR_DEPARTMENT_OTHER): Payer: Medicare Other

## 2016-10-31 DIAGNOSIS — S32009A Unspecified fracture of unspecified lumbar vertebra, initial encounter for closed fracture: Secondary | ICD-10-CM | POA: Diagnosis not present

## 2016-10-31 DIAGNOSIS — R55 Syncope and collapse: Secondary | ICD-10-CM | POA: Diagnosis present

## 2016-10-31 DIAGNOSIS — N179 Acute kidney failure, unspecified: Secondary | ICD-10-CM | POA: Diagnosis not present

## 2016-10-31 DIAGNOSIS — R739 Hyperglycemia, unspecified: Secondary | ICD-10-CM | POA: Diagnosis present

## 2016-10-31 DIAGNOSIS — N183 Chronic kidney disease, stage 3 (moderate): Secondary | ICD-10-CM

## 2016-10-31 DIAGNOSIS — G894 Chronic pain syndrome: Secondary | ICD-10-CM

## 2016-10-31 DIAGNOSIS — I340 Nonrheumatic mitral (valve) insufficiency: Secondary | ICD-10-CM

## 2016-10-31 DIAGNOSIS — Z794 Long term (current) use of insulin: Secondary | ICD-10-CM

## 2016-10-31 DIAGNOSIS — S2232XA Fracture of one rib, left side, initial encounter for closed fracture: Secondary | ICD-10-CM

## 2016-10-31 DIAGNOSIS — I2583 Coronary atherosclerosis due to lipid rich plaque: Secondary | ICD-10-CM

## 2016-10-31 DIAGNOSIS — I10 Essential (primary) hypertension: Secondary | ICD-10-CM

## 2016-10-31 DIAGNOSIS — R296 Repeated falls: Secondary | ICD-10-CM

## 2016-10-31 DIAGNOSIS — E7800 Pure hypercholesterolemia, unspecified: Secondary | ICD-10-CM

## 2016-10-31 DIAGNOSIS — T148XXA Other injury of unspecified body region, initial encounter: Secondary | ICD-10-CM | POA: Diagnosis not present

## 2016-10-31 DIAGNOSIS — J438 Other emphysema: Secondary | ICD-10-CM

## 2016-10-31 DIAGNOSIS — I2510 Atherosclerotic heart disease of native coronary artery without angina pectoris: Secondary | ICD-10-CM | POA: Diagnosis not present

## 2016-10-31 DIAGNOSIS — E1165 Type 2 diabetes mellitus with hyperglycemia: Secondary | ICD-10-CM | POA: Diagnosis not present

## 2016-10-31 DIAGNOSIS — E1142 Type 2 diabetes mellitus with diabetic polyneuropathy: Secondary | ICD-10-CM

## 2016-10-31 DIAGNOSIS — E1122 Type 2 diabetes mellitus with diabetic chronic kidney disease: Secondary | ICD-10-CM | POA: Diagnosis not present

## 2016-10-31 DIAGNOSIS — D649 Anemia, unspecified: Secondary | ICD-10-CM

## 2016-10-31 DIAGNOSIS — I129 Hypertensive chronic kidney disease with stage 1 through stage 4 chronic kidney disease, or unspecified chronic kidney disease: Secondary | ICD-10-CM | POA: Diagnosis not present

## 2016-10-31 DIAGNOSIS — E119 Type 2 diabetes mellitus without complications: Secondary | ICD-10-CM

## 2016-10-31 DIAGNOSIS — N184 Chronic kidney disease, stage 4 (severe): Secondary | ICD-10-CM | POA: Diagnosis not present

## 2016-10-31 LAB — CBC
HEMATOCRIT: 32.6 % — AB (ref 36.0–46.0)
HEMOGLOBIN: 10.7 g/dL — AB (ref 12.0–15.0)
MCH: 29 pg (ref 26.0–34.0)
MCHC: 32.8 g/dL (ref 30.0–36.0)
MCV: 88.3 fL (ref 78.0–100.0)
Platelets: 240 10*3/uL (ref 150–400)
RBC: 3.69 MIL/uL — AB (ref 3.87–5.11)
RDW: 14.9 % (ref 11.5–15.5)
WBC: 7 10*3/uL (ref 4.0–10.5)

## 2016-10-31 LAB — COMPREHENSIVE METABOLIC PANEL
ALT: 13 U/L — AB (ref 14–54)
AST: 16 U/L (ref 15–41)
Albumin: 3.8 g/dL (ref 3.5–5.0)
Alkaline Phosphatase: 91 U/L (ref 38–126)
Anion gap: 9 (ref 5–15)
BUN: 21 mg/dL — ABNORMAL HIGH (ref 6–20)
CHLORIDE: 101 mmol/L (ref 101–111)
CO2: 28 mmol/L (ref 22–32)
CREATININE: 1.57 mg/dL — AB (ref 0.44–1.00)
Calcium: 8.6 mg/dL — ABNORMAL LOW (ref 8.9–10.3)
GFR calc non Af Amer: 33 mL/min — ABNORMAL LOW (ref >60)
GFR, EST AFRICAN AMERICAN: 38 mL/min — AB (ref >60)
Glucose, Bld: 197 mg/dL — ABNORMAL HIGH (ref 65–99)
POTASSIUM: 3.2 mmol/L — AB (ref 3.5–5.1)
Sodium: 138 mmol/L (ref 135–145)
Total Bilirubin: 1 mg/dL (ref 0.3–1.2)
Total Protein: 7.3 g/dL (ref 6.5–8.1)

## 2016-10-31 LAB — MAGNESIUM: Magnesium: 1.3 mg/dL — ABNORMAL LOW (ref 1.7–2.4)

## 2016-10-31 LAB — GLUCOSE, CAPILLARY
GLUCOSE-CAPILLARY: 199 mg/dL — AB (ref 65–99)
GLUCOSE-CAPILLARY: 231 mg/dL — AB (ref 65–99)
Glucose-Capillary: 191 mg/dL — ABNORMAL HIGH (ref 65–99)
Glucose-Capillary: 144 mg/dL — ABNORMAL HIGH (ref 65–99)

## 2016-10-31 LAB — TSH: TSH: 64.668 u[IU]/mL — ABNORMAL HIGH (ref 0.350–4.500)

## 2016-10-31 LAB — HEMOGLOBIN A1C
HEMOGLOBIN A1C: 9.2 % — AB (ref 4.8–5.6)
MEAN PLASMA GLUCOSE: 217.34 mg/dL

## 2016-10-31 LAB — TROPONIN I

## 2016-10-31 LAB — ECHOCARDIOGRAM COMPLETE
Height: 61 in
Weight: 2599.66 oz

## 2016-10-31 LAB — PHOSPHORUS: Phosphorus: 3.2 mg/dL (ref 2.5–4.6)

## 2016-10-31 MED ORDER — NORTRIPTYLINE HCL 25 MG PO CAPS
75.0000 mg | ORAL_CAPSULE | Freq: Every day | ORAL | Status: DC
  Administered 2016-10-31: 75 mg via ORAL
  Filled 2016-10-31 (×2): qty 3

## 2016-10-31 MED ORDER — DULOXETINE HCL 30 MG PO CPEP
60.0000 mg | ORAL_CAPSULE | Freq: Every day | ORAL | Status: DC
  Administered 2016-10-31 – 2016-11-01 (×2): 60 mg via ORAL
  Filled 2016-10-31 (×2): qty 2

## 2016-10-31 MED ORDER — ONDANSETRON HCL 4 MG/2ML IJ SOLN: 4.0000 mg | Freq: Four times a day (QID) | INTRAMUSCULAR | Status: DC | PRN

## 2016-10-31 MED ORDER — ACETAMINOPHEN 325 MG PO TABS: 650.0000 mg | ORAL_TABLET | Freq: Four times a day (QID) | ORAL | Status: DC | PRN

## 2016-10-31 MED ORDER — LEVOTHYROXINE SODIUM 137 MCG PO TABS
137.0000 ug | ORAL_TABLET | Freq: Every day | ORAL | Status: DC
  Administered 2016-10-31 – 2016-11-01 (×2): 137 ug via ORAL
  Filled 2016-10-31 (×3): qty 1

## 2016-10-31 MED ORDER — METHOCARBAMOL 1000 MG/10ML IJ SOLN
500.0000 mg | Freq: Three times a day (TID) | INTRAMUSCULAR | Status: DC | PRN
  Filled 2016-10-31: qty 5

## 2016-10-31 MED ORDER — PRAVASTATIN SODIUM 20 MG PO TABS
20.0000 mg | ORAL_TABLET | Freq: Every day | ORAL | Status: DC
  Administered 2016-10-31: 20 mg via ORAL
  Filled 2016-10-31: qty 1

## 2016-10-31 MED ORDER — INSULIN ASPART 100 UNIT/ML ~~LOC~~ SOLN: 0.0000 [IU] | Freq: Every day | SUBCUTANEOUS | Status: DC

## 2016-10-31 MED ORDER — PANTOPRAZOLE SODIUM 20 MG PO TBEC
20.0000 mg | DELAYED_RELEASE_TABLET | Freq: Every day | ORAL | Status: DC
  Administered 2016-10-31 – 2016-11-01 (×2): 20 mg via ORAL
  Filled 2016-10-31 (×2): qty 1

## 2016-10-31 MED ORDER — SODIUM CHLORIDE 0.9 % IV SOLN
INTRAVENOUS | Status: AC
  Administered 2016-10-31: 03:00:00 via INTRAVENOUS

## 2016-10-31 MED ORDER — MUPIROCIN 2 % EX OINT
1.0000 "application " | TOPICAL_OINTMENT | Freq: Two times a day (BID) | CUTANEOUS | Status: DC
  Administered 2016-10-31 – 2016-11-01 (×4): 1 via NASAL
  Filled 2016-10-31: qty 22

## 2016-10-31 MED ORDER — INSULIN ASPART 100 UNIT/ML ~~LOC~~ SOLN
0.0000 [IU] | Freq: Three times a day (TID) | SUBCUTANEOUS | Status: DC
  Administered 2016-10-31: 3 [IU] via SUBCUTANEOUS
  Administered 2016-10-31: 5 [IU] via SUBCUTANEOUS
  Administered 2016-10-31 – 2016-11-01 (×3): 2 [IU] via SUBCUTANEOUS

## 2016-10-31 MED ORDER — FESOTERODINE FUMARATE ER 8 MG PO TB24
8.0000 mg | ORAL_TABLET | Freq: Every day | ORAL | Status: DC
  Administered 2016-10-31: 8 mg via ORAL
  Filled 2016-10-31 (×2): qty 1

## 2016-10-31 MED ORDER — HYDROCODONE-ACETAMINOPHEN 10-325 MG PO TABS
1.0000 | ORAL_TABLET | Freq: Three times a day (TID) | ORAL | Status: DC | PRN
  Administered 2016-10-31 – 2016-11-01 (×5): 1 via ORAL
  Filled 2016-10-31 (×5): qty 1

## 2016-10-31 MED ORDER — ONDANSETRON HCL 4 MG PO TABS: 4.0000 mg | ORAL_TABLET | Freq: Four times a day (QID) | ORAL | Status: DC | PRN

## 2016-10-31 MED ORDER — INSULIN GLARGINE 100 UNIT/ML ~~LOC~~ SOLN
30.0000 [IU] | Freq: Every day | SUBCUTANEOUS | Status: DC
  Administered 2016-10-31 – 2016-11-01 (×2): 30 [IU] via SUBCUTANEOUS
  Filled 2016-10-31 (×2): qty 0.3

## 2016-10-31 MED ORDER — GUAIFENESIN ER 600 MG PO TB12
600.0000 mg | ORAL_TABLET | Freq: Two times a day (BID) | ORAL | Status: DC
  Administered 2016-10-31 – 2016-11-01 (×4): 600 mg via ORAL
  Filled 2016-10-31 (×4): qty 1

## 2016-10-31 MED ORDER — CHLORHEXIDINE GLUCONATE CLOTH 2 % EX PADS
6.0000 | MEDICATED_PAD | Freq: Every day | CUTANEOUS | Status: DC
  Administered 2016-10-31 – 2016-11-01 (×2): 6 via TOPICAL

## 2016-10-31 MED ORDER — ALBUTEROL SULFATE (2.5 MG/3ML) 0.083% IN NEBU: 2.5000 mg | INHALATION_SOLUTION | RESPIRATORY_TRACT | Status: DC | PRN

## 2016-10-31 MED ORDER — ACETAMINOPHEN 650 MG RE SUPP: 650.0000 mg | Freq: Four times a day (QID) | RECTAL | Status: DC | PRN

## 2016-10-31 MED ORDER — POLYVINYL ALCOHOL 1.4 % OP SOLN: 1.0000 [drp] | Freq: Four times a day (QID) | OPHTHALMIC | Status: DC | PRN

## 2016-10-31 MED ORDER — POTASSIUM CHLORIDE ER 10 MEQ PO TBCR
10.0000 meq | EXTENDED_RELEASE_TABLET | Freq: Every day | ORAL | Status: DC
  Administered 2016-10-31 – 2016-11-01 (×2): 10 meq via ORAL
  Filled 2016-10-31 (×4): qty 1

## 2016-10-31 NOTE — Progress Notes (Signed)
**  Preliminary report by tech**  Carotid artery duplex complete. Findings are consistent with a 1-39 percent stenosis involving the right internal carotid artery and the left internal carotid artery. The vertebral arteries demonstrate antegrade flow.  10/31/16 9:19 AM April Branch RVT

## 2016-10-31 NOTE — Care Management Note (Signed)
[facility] NOTIFICATION   Patient Details  Name: CHLOEANNE POTEET MRN: [id number] Date of Birth: 1945-05-26   Medicare Observation Status Notification Given:   yes    Norina Buzzard, RN 10/31/2016, 4:53 PM

## 2016-10-31 NOTE — Care Management Note (Signed)
ABN letter given. Pt did not bring her glasses and she didn't sign the form.

## 2016-10-31 NOTE — Evaluation (Signed)
Physical Therapy Evaluation Patient Details Name: April Branch MRN: [id number] DOB: 1945-06-12 Today's Date: 10/31/2016   History of Present Illness   71 y.o. female with medical history significant of coronary artery disease, chronic kidney disease stage III-IV,chronic pain, hypothyroidism, hypertension, and insulin-dependent diabetes mellitus. Pt admitted with weakness, falls, lightheadedness.  CT (+) Transverse process fractures L1-L4, L 12th rib fx.     Clinical Impression  On eval, pt required Min assist +2 for safety for mobility. She walked ~ 40 feet with a RW. Pain rated 7/10 with activity. Total assist required for donning TLSO. Pt c/o dizziness when up mobilizing. She presents with general weakness, decreased activity tolerance, and impaired gait and balance. She remains at risk for falls. Recommend ST rehab at SNF if pt is agreeable. If pt feels she has enough assistance at home and/or progresses well she may be able to d/c home with HHPT. Will follow and progress activity as tolerated.     Follow Up Recommendations SNF;Supervision/Assistance - 24 hour (HHPT if pt declines placement and/or she progress well)    Equipment Recommendations  None recommended by PT    Recommendations for Other Services OT consult     Precautions / Restrictions Precautions Precautions: Fall Precaution Comments: hx of multiple falls in last 3 months Restrictions Weight Bearing Restrictions: No      Mobility  Bed Mobility Overal bed mobility: Needs Assistance Bed Mobility: Supine to Sit     Supine to sit: Min assist;HOB elevated     General bed mobility comments: Assist for trunk and to scoot to EOB. Increased time.   Transfers Overall transfer level: Needs assistance Equipment used: Rolling walker (2 wheeled) Transfers: Sit to/from Stand Sit to Stand: Min assist         General transfer comment: Assist to rise, stabilize, control descent. VCs safety, technique, hand  placement. Pt c/o dizziness.   Ambulation/Gait Ambulation/Gait assistance: Min assist;+2 physical assistance;+2 safety/equipment Ambulation Distance (Feet): 40 Feet Assistive device: Rolling walker (2 wheeled) Gait Pattern/deviations: Step-through pattern;Decreased stride length     General Gait Details: Assist to stabilize throughout ambulation distance. Pt is unsteady and remains at risk for falls. Pt c/o dizziness and fatigue. Followed with recliner for safety.   Stairs            Wheelchair Mobility    Modified Rankin (Stroke Patients Only)       Balance Overall balance assessment: Needs assistance;History of Falls         Standing balance support: Bilateral upper extremity supported Standing balance-Leahy Scale: Poor                               Pertinent Vitals/Pain Pain Assessment: 0-10 Pain Score: 7  Pain Location: back Pain Descriptors / Indicators: Aching;Sore Pain Intervention(s): Limited activity within patient's tolerance;Repositioned    Home Living Family/patient expects to be discharged to:: Private residence   Available Help at Discharge: Family Type of Home: House Home Access: Stairs to enter Entrance Stairs-Rails: Can reach both Entrance Stairs-Number of Steps: 2 Home Layout: One level Home Equipment: Walker - 4 wheels      Prior Function Level of Independence: Needs assistance   Gait / Transfers Assistance Needed: no AD but falls often, community ambulator   ADL's / Homemaking Assistance Needed: some assist with ADLs         Hand Dominance        Extremity/Trunk Assessment  Upper Extremity Assessment Upper Extremity Assessment: Generalized weakness    Lower Extremity Assessment Lower Extremity Assessment: Generalized weakness    Cervical / Trunk Assessment Cervical / Trunk Assessment: Normal  Communication   Communication:  (some slurred speech)  Cognition Arousal/Alertness: Awake/alert Behavior During  Therapy: WFL for tasks assessed/performed Overall Cognitive Status: Within Functional Limits for tasks assessed                                        General Comments      Exercises     Assessment/Plan    PT Assessment Patient needs continued PT services  PT Problem List Decreased strength;Decreased mobility;Decreased activity tolerance;Decreased balance;Decreased knowledge of use of DME;Pain       PT Treatment Interventions DME instruction;Therapeutic activities;Therapeutic exercise;Patient/family education;Gait training;Balance training;Functional mobility training    PT Goals (Current goals can be found in the Care Plan section)  Acute Rehab PT Goals Patient Stated Goal: go home PT Goal Formulation: With patient Time For Goal Achievement: 11/14/16 Potential to Achieve Goals: Good    Frequency Min 3X/week   Barriers to discharge        Co-evaluation               AM-PAC PT "6 Clicks" Daily Activity  Outcome Measure Difficulty turning over in bed (including adjusting bedclothes, sheets and blankets)?: Unable Difficulty moving from lying on back to sitting on the side of the bed? : Unable Difficulty sitting down on and standing up from a chair with arms (e.g., wheelchair, bedside commode, etc,.)?: Unable Help needed moving to and from a bed to chair (including a wheelchair)?: A Little Help needed walking in hospital room?: A Little Help needed climbing 3-5 steps with a railing? : A Lot 6 Click Score: 11    End of Session Equipment Utilized During Treatment: Gait belt Activity Tolerance: Patient tolerated treatment well Patient left: in chair;with call bell/phone within reach   PT Visit Diagnosis: Muscle weakness (generalized) (M62.81);Difficulty in walking, not elsewhere classified (R26.2);Pain Pain - part of body:  (back)    Time: 8657-8469 PT Time Calculation (min) (ACUTE ONLY): 31 min   Charges:   PT Evaluation $PT Eval Low  Complexity: 1 Low PT Treatments $Gait Training: 8-22 mins   PT G Codes:   PT G-Codes **NOT FOR INPATIENT CLASS** Functional Assessment Tool Used: AM-PAC 6 Clicks Basic Mobility;Clinical judgement Functional Limitation: Mobility: Walking and moving around Mobility: Walking and Moving Around Current Status (G2952): At least 20 percent but less than 40 percent impaired, limited or restricted Mobility: Walking and Moving Around Goal Status [phone]): At least 1 percent but less than 20 percent impaired, limited or restricted      Weston Anna, MPT Pager: [phone]

## 2016-10-31 NOTE — Progress Notes (Signed)
April Branch is a 71 y.o. female with medical history significant of coronary artery disease, chronic kidney disease stage III-IV,chronic pain, hypothyroidism, hypertension, and insulin-dependent diabetes mellitus, COPD, anemia, hypothyroidism admitted for observation this morning for hyperglycemia with falls.  Admit H&P reviewed and noted as well as lab work and medications. Agree with current plan of care

## 2016-10-31 NOTE — H&P (Signed)
FAVOR KREH NLZ:[id number] DOB: [birth year] DOA: 10/30/2016     PCP: [name], MD   Outpatient Specialists: [name]  Patient coming from:   home Lives with son   Chief Complaint: Generalized weakness and falls  HPI: April Branch is a 71 y.o. female with medical history significant of coronary artery disease, chronic kidney disease stage III-IV,chronic pain, hypothyroidism, hypertension, and insulin-dependent diabetes mellitus   COPD, anemia, hypothyroidism  Presented with repeated falls and generalized fatigue she's been feeling intermittently lightheaded she fell today twice once in her back. Reports poor blood sugar control recently. She feels lightheaded when she stands up. Patient have had this 2 episodes of stripping and have had trouble getting up. One fall was witnessed by home health nurse who called EMS. Patient has been endorsing lower back pain ever since her fall she is unsure about any head trauma. No chest pain or shortness of breath no neurological complaints denies any alcohol abuse. Apparently during her fall  was at home but he was sleeping and didn't hear her fall  Patient have had a similar admission from the 20 to 23rd of August 2018 at that time she was found to be significantly bradycardic hence diffusely weak TSH was elevated 74.5 patient have discontinued her Synthroid her Synthroid was restarted and camera management was consulted and patient was discharged to home with home health PT   Regarding pertinent Chronic problems:  History of diabetes mellitus type 2 with CK D followed by Dr. [name] status post left femoropopliteal bypass graft with propaten on 10/12/2011 with subsequent occlusion, and left femoral to below-knee popliteal bypass with propaten on 04/17/2014, by Dr. Oneida Alar.  She also has a history of carotid artery stenosis.    IN ER:  Temp (24hrs), Avg:99.3 F (37.4 C), Min:99.3 F (37.4 C), Max:99.3 F (37.4 C)      on arrival    ED Triage Vitals  Enc Vitals Group     BP 10/30/16 1708 (!) 158/81     Pulse Rate 10/30/16 1708 (!) 57     Resp 10/30/16 1708 16     Temp 10/30/16 1708 99.3 F (37.4 C)     Temp Source 10/30/16 1708 Oral     SpO2 10/30/16 1708 100 %     Weight --      Height --      Head Circumference --      Peak Flow --      Pain Score 10/30/16 1938 8     Pain Loc --      Pain Edu? --      Excl. in [place]? --     Latest RR20 satting 99% HR 56 BP 146/73   Glucose 212   WBC 9.6 hemoglobin 11.8 platelets 270 Sodium 1:30 5K3.6 BUN 26 creatinine 1.76 down from 2.27 in August Trop< 0.03 CT of lumbar spine showing L1-L4 transverse process fractures posteriorly left nondisplaced 12th rib fracture hematoma and contusion in the left flank back subcutaneous tissues  Following Medications were ordered in ER: Medications  iopamidol (ISOVUE-300) 61 % injection (not administered)  oxyCODONE-acetaminophen (PERCOCET/ROXICET) 5-325 MG per tablet 1 tablet (not administered)  morphine 4 MG/ML injection 4 mg (4 mg Intravenous Given 10/30/16 1829)  sodium chloride 0.9 % bolus 1,000 mL (1,000 mLs Intravenous New Bag/Given 10/30/16 1828)  insulin aspart (novoLOG) injection 5 Units (5 Units Subcutaneous Given 10/30/16 1859)  morphine 4 MG/ML injection 4 mg (4 mg Intravenous Given 10/31/16 0009)  iopamidol (ISOVUE-300)  61 % injection 30 mL (30 mLs Oral Contrast Given 10/30/16 2130)     ER provider discussed case with:  Neurosurgery ho recommends: TLSO brace outpatient follow-up   Hospitalist was called for admission for repeated falls resulting in acute fracture of transverse  lumbar spine processes   Review of Systems:    Pertinent positives include: fatigue, frequent falls back pain  Constitutional:  No weight loss, night sweats, Fevers, chills,  weight loss  HEENT:  No headaches, Difficulty swallowing,Tooth/dental problems,Sore throat,  No sneezing, itching, ear ache, nasal congestion, post nasal drip,   Cardio-vascular:  No chest pain, Orthopnea, PND, anasarca, dizziness, palpitations.no Bilateral lower extremity swelling  GI:  No heartburn, indigestion, abdominal pain, nausea, vomiting, diarrhea, change in bowel habits, loss of appetite, melena, blood in stool, hematemesis Resp:  no shortness of breath at rest. No dyspnea on exertion, No excess mucus, no productive cough, No non-productive cough, No coughing up of blood.No change in color of mucus.No wheezing. Skin:  no rash or lesions. No jaundice GU:  no dysuria, change in color of urine, no urgency or frequency. No straining to urinate.  No flank pain.  Musculoskeletal:  No joint pain or no joint swelling. No decreased range of motion. No back pain.  Psych:  No change in mood or affect. No depression or anxiety. No memory loss.  Neuro: no localizing neurological complaints, no tingling, no weakness, no double vision, no gait abnormality, no slurred speech, no confusion  As per HPI otherwise 10 point review of systems negative.   Past Medical History: Past Medical History:  Diagnosis Date  . Anemia   . Anginal pain ([place])    1 MONTH AGO   . Anxiety   . Arthritis   . CAD (coronary artery disease)   . CAD (coronary artery disease) of bypass graft   . 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 Colon polyps 09/11/2010   Tubular adenoma  . COPD 12/14/2008   PATIENT DENIES    . Depression   . Diabetes mellitus age 59   insulin dependent  . Diabetic coma ([place]) Feb. 2014  . Fall at home Jan. 2, 2016  Jan. 13, 2016   Left knee  . Fibromyalgia   . GERD 07/20/2006  . GERD (gastroesophageal reflux disease)   . Headache(784.0)   . History of transient ischemic attack (TIA)   . HPV (human papilloma virus) infection   . Hyperlipidemia   . Hypertension   . Hypothyroidism   . Insulin dependent type 2 diabetes mellitus, controlled ([place])   . Lumbar disc disease   . Myocardial infarction ([place])     AGE 69    . Neuromuscular disorder ([place])    PERIPHERAL NEUROPATHY  . Peripheral vascular disease ([place])   . Personal history of malignant neoplasm of kidney(V10.52) 12/14/2008   Laparoscopic biopsy and cryoablation 7/08 Dr. Vernie Shanks   . Renal disorder    chronic kidney disease   . Stroke [facility])    3 MINI STROKES  RT SIDED WEAKNESS  . Vertigo    Past Surgical History:  Procedure Laterality Date  . ABDOMINAL AORTAGRAM N/A 08/21/2011   Procedure: ABDOMINAL Maxcine Ham;  Surgeon: Elam Dutch, MD;  Location: [facility] CATH LAB;  Service: Cardiovascular;  Laterality: N/A;  . ABDOMINAL AORTAGRAM N/A 03/16/2014   Procedure: ABDOMINAL Maxcine Ham;  Surgeon: Elam Dutch, MD;  Location: [facility] CATH LAB;  Service: Cardiovascular;  Laterality: N/A;  .  ABDOMINAL HYSTERECTOMY    . APPENDECTOMY    . BLADDER SUSPENSION    . CARDIAC CATHETERIZATION    . CHOLECYSTECTOMY OPEN    . ESOPHAGOGASTRODUODENOSCOPY (EGD) WITH PROPOFOL N/A 05/16/2014   Procedure: ESOPHAGOGASTRODUODENOSCOPY (EGD) WITH PROPOFOL with Balloon dilation;  Surgeon: Milus Banister, MD;  Location: [place];  Service: Endoscopy;  Laterality: N/A;  . ESOPHAGOGASTRODUODENOSCOPY (EGD) WITH PROPOFOL N/A 09/02/2015   Procedure: ESOPHAGOGASTRODUODENOSCOPY (EGD) WITH PROPOFOL;  Surgeon: Doran Stabler, MD;  Location: [place];  Service: Gastroenterology;  Laterality: N/A;  . FEMORAL-POPLITEAL BYPASS GRAFT  10/12/2011   Procedure: BYPASS GRAFT FEMORAL-POPLITEAL ARTERY;  Surgeon: Elam Dutch, MD;  [facility];  Service: Vascular;  Laterality: Left;  Left Femoral-Popliteal Bypass Graft using 38mm x 80cm Propaten Graft with intraop arteriogram times one.  . FEMORAL-POPLITEAL BYPASS GRAFT Left 04/17/2014   Procedure: REDO LEFT FEMORAL-POPLITEAL ARTERY BYPASS USING GORE PROPATEN 46mmx80cm GRAFT;  Surgeon: Elam Dutch, MD;  Location: [place];  Service: Vascular;  Laterality: Left;  . FOOT SURGERY Left    "bone spurs"  . INTRAOPERATIVE  ARTERIOGRAM  10/12/2011   Procedure: INTRA OPERATIVE ARTERIOGRAM;  Surgeon: Elam Dutch, MD;  Location: [place];  Service: Vascular;  Laterality: Left;  . KNEE ARTHROSCOPY Bilateral    "cartilage"  . Laparascopic cryoablation of left kidney  08/2006   Dr. Gladis Riffle for renal cell cancer  . LEFT HEART CATHETERIZATION WITH CORONARY ANGIOGRAM N/A 05/11/2011   Procedure: LEFT HEART CATHETERIZATION WITH CORONARY ANGIOGRAM;  Surgeon: Jolaine Artist, MD;  Location: [facility] CATH LAB;  Service: Cardiovascular;  Laterality: N/A;  . LUNG SURGERY Right    lung nodule removed from the right side  . PR VEIN BYPASS GRAFT,AORTO-FEM-POP  05/27/2010  . SHOULDER ARTHROSCOPY Bilateral    'Spurs"  . TUBAL LIGATION       Social History:  Ambulatory  independently      reports that she has been smoking Cigarettes.  She has a 75.00 pack-year smoking history. She has never used smokeless tobacco. She reports that she does not drink alcohol or use drugs.  Allergies:  No Known Allergies     Family History:   Family History  Problem Relation Age of Onset  . Heart disease Mother   . Lung cancer Mother   . Cancer Mother        Lung  . Heart disease Father   . Lung cancer Father   . Cancer Father        Lung  . Heart disease Sister        CABG- Open Heart    Medications: Prior to Admission medications   Medication Sig Start Date End Date Taking? Authorizing Provider  cilostazol (PLETAL) 100 MG tablet Take 1 tablet (100 mg total) by mouth 2 (two) times daily before a meal. 03/17/16  Yes Fields, Jessy Oto, MD  clopidogrel (PLAVIX) 75 MG tablet Take 75 mg by mouth daily.  04/02/14  Yes [provider]  DULoxetine (CYMBALTA) 60 MG capsule Take 1 capsule (60 mg total) by mouth daily. 03/19/16  Yes Elayne Snare, MD  ferrous sulfate 325 (65 FE) MG tablet Take 325 mg by mouth daily with breakfast.    Yes [provider]  gabapentin (NEURONTIN) 100 MG capsule Take 200 mg by mouth at bedtime.   01/07/15  Yes [provider]  HYDROcodone-acetaminophen (NORCO) 10-325 MG tablet Take 1 tablet by mouth every 4 (four) hours as needed for moderate pain or severe pain.  Patient taking differently: Take 1 tablet by mouth 3 (three) times daily as needed (for pain).  06/28/15  Yes Elwin Mocha, MD  Insulin Glargine (TOUJEO SOLOSTAR) 300 UNIT/ML SOPN Inject 30 Units into the skin daily. 10/15/16  Yes Patrecia Pour, MD  insulin lispro (HUMALOG KWIKPEN) 100 UNIT/ML KiwkPen INJECT 5 UNITS 3 TIMES A DAY AS NEEDED FOR BLOOD SUGAR 10/15/16  Yes Patrecia Pour, MD  lansoprazole (PREVACID) 30 MG capsule Take 1 capsule (30 mg total) by mouth daily. 05/11/16  Yes Geradine Girt, DO  levothyroxine (SYNTHROID, LEVOTHROID) 137 MCG tablet Take 1 tablet (137 mcg total) by mouth daily before breakfast. 10/15/16  Yes Patrecia Pour, MD  metFORMIN (GLUCOPHAGE-XR) 500 MG 24 hr tablet Take 500 mg by mouth daily with breakfast.   Yes [provider]  nortriptyline (PAMELOR) 75 MG capsule Take 75 mg by mouth at bedtime. 01/24/14  Yes [provider]  potassium chloride (KLOR-CON 10) 10 MEQ tablet Take 1 tablet (10 mEq total) by mouth daily. 03/19/16  Yes Elayne Snare, MD  pravastatin (PRAVACHOL) 20 MG tablet Take 20 mg by mouth daily.   Yes [provider]  QUEtiapine (SEROQUEL) 100 MG tablet Take 100 mg by mouth at bedtime. 09/25/16  Yes [provider]  tolterodine (DETROL LA) 4 MG 24 hr capsule Take 4 mg by mouth daily.    Yes [provider]  trimethoprim-polymyxin b (POLYTRIM) ophthalmic solution Place 1 drop into the right eye every 4 (four) hours. 10/05/16  Yes Larene Pickett, PA-C  Vitamin D, Ergocalciferol, (DRISDOL) 50000 units CAPS capsule Take 50,000 Units by mouth once a week. 09/18/16  Yes [provider]  ACCU-CHEK FASTCLIX LANCETS MISC Use 4 per day to check blood sugar dx code E11.9 11/29/14   Elayne Snare, MD  hydroxypropyl methylcellulose / hypromellose  (ISOPTO TEARS / GONIOVISC) 2.5 % ophthalmic solution Place 1 drop into both eyes 4 (four) times daily as needed for dry eyes.    [provider]  Insulin Pen Needle (BD PEN NEEDLE NANO U/F) 32G X 4 MM MISC Use to inject insulin 10/30/14   Elayne Snare, MD  LORazepam (ATIVAN) 1 MG tablet Take 1 tablet (1 mg total) by mouth 3 times/day as needed-between meals & bedtime for anxiety. Patient not taking: Reported on 10/30/2016 06/28/15   Elwin Mocha, MD  metFORMIN (GLUCOPHAGE-XR) 750 MG 24 hr tablet TAKE 2 TABLETS BY MOUTH EVERY DAY Patient not taking: Reported on 10/30/2016 09/09/16   Elayne Snare, MD    Physical Exam: Patient Vitals for the past 24 hrs:  BP Temp Temp src Pulse Resp SpO2  10/30/16 2200 (!) 146/73 - - (!) 56 20 99 %  10/30/16 1841 (!) 183/80 - - (!) 57 16 99 %  10/30/16 1708 (!) 158/81 99.3 F (37.4 C) Oral (!) 57 16 100 %    1. General:  in No Acute distress   Chronically ill  -appearing 2. Psychological: Alert and  Oriented 3. Head/ENT:    Dry Mucous Membranes                          Head Non traumatic, neck supple                         Poor Dentition 4. SKIN:  decreased Skin turgor,  Skin clean Dry and intact no rash 5. Heart: Regular rate and rhythm no  Murmur, no Rub or gallop 6. Lungs: no wheezes occasional  crackles   7. Abdomen: Soft,  non-tender, Non distended   obese  bowel sounds present 8. Lower extremities: no clubbing, cyanosis, or edema 9. Neurologically  strength 5 out of 5 in all 4 extremities cranial nerves II through XII intact 10. MSK: Normal range of motion   body mass index is unknown because there is no height or weight on file.  Labs on Admission:   Labs on Admission: I have personally reviewed following labs and imaging studies  CBC:  Recent Labs Lab 10/30/16 1830  WBC 9.6  NEUTROABS 7.4  HGB 11.8*  HCT 34.7*  MCV 85.7  PLT 735   Basic Metabolic Panel:  Recent Labs Lab 10/30/16 1830  NA 135  K 3.6  CL 98*  CO2 26    GLUCOSE 330*  BUN 26*  CREATININE 1.76*  CALCIUM 8.9   GFR: Estimated Creatinine Clearance: 28.5 mL/min (A) (by C-G formula based on SCr of 1.76 mg/dL (H)). Liver Function Tests: No results for input(s): AST, ALT, ALKPHOS, BILITOT, PROT, ALBUMIN in the last 168 hours. No results for input(s): LIPASE, AMYLASE in the last 168 hours. No results for input(s): AMMONIA in the last 168 hours. Coagulation Profile: No results for input(s): INR, PROTIME in the last 168 hours. Cardiac Enzymes:  Recent Labs Lab 10/30/16 1830  TROPONINI <0.03   BNP (last 3 results) No results for input(s): PROBNP in the last 8760 hours. HbA1C: No results for input(s): HGBA1C in the last 72 hours. CBG:  Recent Labs Lab 10/30/16 1826 10/30/16 2100  GLUCAP 354* 212*   Lipid Profile: No results for input(s): CHOL, HDL, LDLCALC, TRIG, CHOLHDL, LDLDIRECT in the last 72 hours. Thyroid Function Tests: No results for input(s): TSH, T4TOTAL, FREET4, T3FREE, THYROIDAB in the last 72 hours. Anemia Panel: No results for input(s): VITAMINB12, FOLATE, FERRITIN, TIBC, IRON, RETICCTPCT in the last 72 hours. Urine analysis:    Component Value Date/Time   COLORURINE YELLOW 10/30/2016 2010   APPEARANCEUR CLEAR 10/30/2016 2010   LABSPEC 1.016 10/30/2016 2010   PHURINE 5.0 10/30/2016 2010   GLUCOSEU >=500 (A) 10/30/2016 2010   GLUCOSEU >=1000 (A) 12/13/2015 1353   HGBUR MODERATE (A) 10/30/2016 2010   BILIRUBINUR NEGATIVE 10/30/2016 2010   [place] NEGATIVE 10/30/2016 2010   PROTEINUR 30 (A) 10/30/2016 2010   UROBILINOGEN 0.2 12/13/2015 1353   NITRITE NEGATIVE 10/30/2016 2010   LEUKOCYTESUR NEGATIVE 10/30/2016 2010   Sepsis Labs: @LABRCNTIP (procalcitonin:4,lacticidven:4) )No results found for this or any previous visit (from the past 240 hour(s)).     UA   no evidence of UTI  Hematuria present   Lab Results  Component Value Date   HGBA1C 10.1 (H) 10/12/2016    Estimated Creatinine Clearance: 28.5  mL/min (A) (by C-G formula based on SCr of 1.76 mg/dL (H)).  BNP (last 3 results) No results for input(s): PROBNP in the last 8760 hours.   ECG REPORT  Independently reviewed Rate:59  Rhythm: Sinus bradycardia short PR intervals ST&T Change: No acute ischemic changes   QTC 489  There were no vitals filed for this visit.   Cultures:    Component Value Date/Time   SDES URINE, RANDOM 10/04/2016 2310   SPECREQUEST ADDED 0115 10/05/16 10/04/2016 2310   CULT MULTIPLE SPECIES PRESENT, SUGGEST RECOLLECTION (A) 10/04/2016 2310   REPTSTATUS 10/06/2016 FINAL 10/04/2016 2310     Radiological Exams on Admission: Ct Abdomen Pelvis Wo Contrast  Result Date: 10/30/2016 CLINICAL DATA:  Abdominal pain.  Two falls today. EXAM: CT ABDOMEN AND PELVIS WITHOUT CONTRAST TECHNIQUE: Multidetector CT imaging of the abdomen and pelvis was performed following the standard protocol without IV contrast. COMPARISON:  CT 09/09/2013 FINDINGS: Lower chest: Multi chamber cardiomegaly. Small pericardial effusion. No pleural fluid or consolidation. Nondisplaced fracture of lateral left twelfth rib. Hepatobiliary: Homogeneous attenuation of hepatic parenchyma without focal lesion or evidence of injury. No perihepatic fluid. Gallbladder physiologically distended, no calcified stone. No biliary dilatation. Pancreas: No ductal dilatation or inflammation. Spleen: Normal in size without focal abnormality. Splenule at the hilum. No perisplenic fluid. Adrenals/Urinary Tract: Low-density left adrenal adenoma. Right adrenal gland is normal. Mild perinephric edema about both kidneys, right greater than left, nonspecific. Cortical scarring in the mid left kidney with calcifications. Urinary bladder is physiologically distended. Stomach/Bowel: Descending and sigmoid colonic diverticulosis without diverticulitis. Small to moderate colonic stool burden. The appendix is surgically absent. Single prominent small bowel loops in the central  abdomen is likely spurious, no evidence of obstruction. No bowel inflammation. Vascular/Lymphatic: Dense aortic and branch atherosclerosis. No aneurysm. No abdominal or pelvic adenopathy. Probable left femoral bypass graft incompletely assessed without contrast. Reproductive: Status post hysterectomy. No adnexal masses. Other: Subcutaneous soft tissue edema in the posterior left flank/back. Multiple rounded subcutaneous densities in the region of edema likely hematoma in setting of fall. No intra- abdominal ascites or free air. No retroperitoneal fluid. Musculoskeletal: Nondisplaced left posterior twelfth rib fracture. Mildly displaced left L1 through L4 transverse process fractures. Vertebral body heights are normal. Bony pelvis is intact. IMPRESSION: 1. Left L1 through L4 transverse process fractures and posterior left nondisplaced twelfth rib fracture. 2. Hematoma and contusion in the left flank/back subcutaneous tissues. 3. No evidence of intra-abdominal/pelvic abnormality. 4. Colonic diverticulosis.  Aortic Atherosclerosis (ICD10-I70.0). Electronically Signed   By: Jeb Levering M.D.   On: 10/30/2016 22:59   Dg Chest 2 View  Result Date: 10/30/2016 CLINICAL DATA:  Two falls today. Intermittent dizziness. Clinical concern for occult infection. EXAM: CHEST  2 VIEW COMPARISON:  Radiograph 10/12/2016 FINDINGS: The cardiomediastinal contours are unchanged with stable cardiomegaly. Right perihilar scarring is stable. Pulmonary vasculature is normal. No consolidation, pleural effusion, or pneumothorax. No acute osseous abnormalities are seen. IMPRESSION: Stable cardiomegaly. No acute abnormality. No evidence of intrathoracic infection. Electronically Signed   By: Jeb Levering M.D.   On: 10/30/2016 19:37   Dg Lumbar Spine Complete  Result Date: 10/30/2016 CLINICAL DATA:  Pain after a fall EXAM: LUMBAR SPINE - COMPLETE 4+ VIEW COMPARISON:  None. FINDINGS: Surgical clips in the right upper quadrant. Lateral  lumbar alignment within normal limits. Mild dextroscoliosis. Vertebral body heights are maintained. Mild diffuse degenerative changes. Aortic atherosclerosis. IMPRESSION: Mild scoliosis and degenerative changes. No definite acute osseous abnormality. Electronically Signed   By: Donavan Foil M.D.   On: 10/30/2016 18:05   Ct Head Wo Contrast  Result Date: 10/30/2016 CLINICAL DATA:  Two falls today. Recent treatment for MRSA. Intermittent dizziness. EXAM: CT HEAD WITHOUT CONTRAST CT CERVICAL SPINE WITHOUT CONTRAST TECHNIQUE: Multidetector CT imaging of the head and cervical spine was performed following the standard protocol without intravenous contrast. Multiplanar CT image reconstructions of the cervical spine were also generated. COMPARISON:  Head CT dated 10/12/2016.  Brain MRI dated 08/31/2016. FINDINGS: CT HEAD FINDINGS Brain: There is mild generalized age-related parenchymal atrophy with commensurate dilatation of the ventricles and sulci. There are patchy areas of low density within the bilateral periventricular and subcortical white matter regions, compatible with chronic small vessel ischemic  changes described on earlier MRI. There is no mass, hemorrhage, edema or other evidence of acute parenchymal abnormality. No extra-axial hemorrhage. Vascular: There are chronic calcified atherosclerotic changes of the large vessels at the skull base. No unexpected hyperdense vessel. Skull: Normal. Negative for fracture or focal lesion. Sinuses/Orbits: No acute finding. Other: None. CT CERVICAL SPINE FINDINGS Alignment: No evidence of acute vertebral body subluxation. Straightening of the normal cervical lordosis is likely related to underlying degenerative change. Skull base and vertebrae: No fracture line or displaced fracture fragment identified. Facet joints appear normally aligned. Soft tissues and spinal canal: No prevertebral fluid or swelling. No visible canal hematoma. Disc levels: Mild disc desiccations in  the mid and lower cervical spine, as manifested by disc space narrowings and mild osseous spurring. Associated mild disc bulges at the C3-4 through C6-7 levels. At C4-5, the disc bulge is causing moderate central canal stenosis. No more than mild central canal stenosis at the remainder of the levels. Additional degenerative hypertrophy of the uncovertebral and facet joints are causing moderate right neural foramen stenosis and mild left neural foramen stenosis at C4-5, with possible associated nerve root impingement. Upper chest: No acute findings. Other: Bilateral carotid atherosclerosis. IMPRESSION: 1. No acute intracranial abnormality. No intracranial mass, hemorrhage or edema. No skull fracture. Chronic small vessel ischemic changes within the white matter. 2. No fracture or acute subluxation within the cervical spine. Degenerative changes of the cervical spine, mild to moderate in degree, as detailed above. 3. Carotid atherosclerosis. Electronically Signed   By: Franki Cabot M.D.   On: 10/30/2016 18:22   Ct Cervical Spine Wo Contrast  Result Date: 10/30/2016 CLINICAL DATA:  Two falls today. Recent treatment for MRSA. Intermittent dizziness. EXAM: CT HEAD WITHOUT CONTRAST CT CERVICAL SPINE WITHOUT CONTRAST TECHNIQUE: Multidetector CT imaging of the head and cervical spine was performed following the standard protocol without intravenous contrast. Multiplanar CT image reconstructions of the cervical spine were also generated. COMPARISON:  Head CT dated 10/12/2016.  Brain MRI dated 08/31/2016. FINDINGS: CT HEAD FINDINGS Brain: There is mild generalized age-related parenchymal atrophy with commensurate dilatation of the ventricles and sulci. There are patchy areas of low density within the bilateral periventricular and subcortical white matter regions, compatible with chronic small vessel ischemic changes described on earlier MRI. There is no mass, hemorrhage, edema or other evidence of acute parenchymal  abnormality. No extra-axial hemorrhage. Vascular: There are chronic calcified atherosclerotic changes of the large vessels at the skull base. No unexpected hyperdense vessel. Skull: Normal. Negative for fracture or focal lesion. Sinuses/Orbits: No acute finding. Other: None. CT CERVICAL SPINE FINDINGS Alignment: No evidence of acute vertebral body subluxation. Straightening of the normal cervical lordosis is likely related to underlying degenerative change. Skull base and vertebrae: No fracture line or displaced fracture fragment identified. Facet joints appear normally aligned. Soft tissues and spinal canal: No prevertebral fluid or swelling. No visible canal hematoma. Disc levels: Mild disc desiccations in the mid and lower cervical spine, as manifested by disc space narrowings and mild osseous spurring. Associated mild disc bulges at the C3-4 through C6-7 levels. At C4-5, the disc bulge is causing moderate central canal stenosis. No more than mild central canal stenosis at the remainder of the levels. Additional degenerative hypertrophy of the uncovertebral and facet joints are causing moderate right neural foramen stenosis and mild left neural foramen stenosis at C4-5, with possible associated nerve root impingement. Upper chest: No acute findings. Other: Bilateral carotid atherosclerosis. IMPRESSION: 1. No acute intracranial abnormality. No  intracranial mass, hemorrhage or edema. No skull fracture. Chronic small vessel ischemic changes within the white matter. 2. No fracture or acute subluxation within the cervical spine. Degenerative changes of the cervical spine, mild to moderate in degree, as detailed above. 3. Carotid atherosclerosis. Electronically Signed   By: Franki Cabot M.D.   On: 10/30/2016 18:22   Dg Shoulder Left  Result Date: 10/30/2016 CLINICAL DATA:  Fall EXAM: LEFT SHOULDER - 2+ VIEW COMPARISON:  None. FINDINGS: No fracture or dislocation. AC joint degenerative change. Left lung apex is  clear IMPRESSION: No acute osseous abnormality. Electronically Signed   By: Donavan Foil M.D.   On: 10/30/2016 18:03    Chart has been reviewed    Assessment/Plan   71 y.o. female with medical history significant of coronary artery disease, chronic kidney disease stage III-IV,chronic pain, hypothyroidism, hypertension, and insulin-dependent diabetes mellitus   COPD, anemia, hypothyroidism  Admitted for repeated falls resulting in acute fracture of transverse  lumbar spine processes   Present on Admission: . Hyperglycemia - the setting of poorly controlled diabetes suspect medication noncompliance order diabetes coordinator consult Fatigue and frequent falls will assess TSH urine prior admission patient had similar presentation and was noncompliant with Synthroid, PT OT evaluation prior to discharge, given unclear history patient may have had a syncopal event and not to obtain carotid Dopplers and echo gram cycle cardiac enzymes Bradycardia monitor on telemetry check TSH not on beta blocker  . Anemia - chronic continue to monitor . Atherosclerosis of native artery of extremity with intermittent claudication (HCC) -  hold Plavix currently secondary to hematoma and fractures  . CAD (coronary artery disease)- continue statin and hold Plavix  . Chronic pain syndrome - Hold sedating medications for tonight and reassess  . CKD (chronic kidney disease), stage III - Creatinine currently improving monitor his fluid status and avoid nephrotoxic medications  . COPD (chronic obstructive pulmonary disease) (HCC) - Currently appears to be at DM2 with diabetic neuropathy and CK D continue Toujeo, order sliding scale . Diabetic peripheral neuropathy (HCC) - continue nortriptyline  . Essential hypertension- stable continue home medications  . Hyperlipidemia- continue home medications currently stable  . Closed fracture of transverse process of lumbar vertebra ([place])- TLCO brace and pain mangement .  Hematoma - Hold off plavix   Other plan as per orders.  DVT prophylaxis:  SCD    Code Status:  FULL CODE  as per patient    Family Communication:   Family not at  Bedside    Disposition Plan:    likely will need placement for rehabilitation                           Would benefit from PT/OT eval prior to DC   ordered                       Diabetes coordinator                             Consults called: none  Admission status:    obs   Level of care     tele          I have spent a total of 57min on this admission  Jaymes Revels 10/31/2016, 2:24 AM    [name]  Pager [phone]   after 2 AM please page floor coverage PA If 7AM-7PM, please contact the  day team taking care of the patient  [email or web address]  Password [email or web address]

## 2016-10-31 NOTE — Progress Notes (Signed)
  Echocardiogram 2D Echocardiogram has been performed.  April Branch T Meliana Canner 10/31/2016, 10:30 AM

## 2016-11-01 DIAGNOSIS — N179 Acute kidney failure, unspecified: Secondary | ICD-10-CM | POA: Diagnosis not present

## 2016-11-01 DIAGNOSIS — S32009A Unspecified fracture of unspecified lumbar vertebra, initial encounter for closed fracture: Secondary | ICD-10-CM

## 2016-11-01 LAB — GLUCOSE, CAPILLARY
Glucose-Capillary: 163 mg/dL — ABNORMAL HIGH (ref 65–99)
Glucose-Capillary: 155 mg/dL — ABNORMAL HIGH (ref 65–99)

## 2016-11-01 LAB — VAS US CAROTID
LCCADDIAS: -22 cm/s
LCCADSYS: -77 cm/s
LCCAPSYS: 87 cm/s
LEFT ECA DIAS: 0 cm/s
LEFT VERTEBRAL DIAS: -8 cm/s
LICADSYS: -64 cm/s
Left CCA prox dias: 12 cm/s
Left ICA dist dias: -17 cm/s
Left ICA prox dias: -24 cm/s
Left ICA prox sys: -97 cm/s
RCCADSYS: -51 cm/s
RCCAPSYS: 115 cm/s
RIGHT ECA DIAS: -4 cm/s
RIGHT VERTEBRAL DIAS: 13 cm/s
Right CCA prox dias: 8 cm/s

## 2016-11-01 MED ORDER — MAGNESIUM OXIDE 400 (241.3 MG) MG PO TABS
400.0000 mg | ORAL_TABLET | Freq: Two times a day (BID) | ORAL | Status: DC
  Administered 2016-11-01: 400 mg via ORAL
  Filled 2016-11-01: qty 1

## 2016-11-01 MED ORDER — POTASSIUM CHLORIDE CRYS ER 20 MEQ PO TBCR
40.0000 meq | EXTENDED_RELEASE_TABLET | Freq: Two times a day (BID) | ORAL | Status: AC
  Administered 2016-11-01: 40 meq via ORAL
  Filled 2016-11-01: qty 2

## 2016-11-01 MED ORDER — HYDROCODONE-ACETAMINOPHEN 10-325 MG PO TABS: 1.0000 | ORAL_TABLET | Freq: Three times a day (TID) | ORAL | 0 refills | Status: DC | PRN

## 2016-11-01 MED ORDER — ALBUTEROL SULFATE (2.5 MG/3ML) 0.083% IN NEBU: 2.5000 mg | INHALATION_SOLUTION | RESPIRATORY_TRACT | 12 refills | Status: DC | PRN

## 2016-11-01 MED ORDER — HYDROCODONE-ACETAMINOPHEN 10-325 MG PO TABS
1.0000 | ORAL_TABLET | Freq: Once | ORAL | Status: AC
  Administered 2016-11-01: 1 via ORAL
  Filled 2016-11-01: qty 1

## 2016-11-01 MED ORDER — METHOCARBAMOL 500 MG PO TABS: 500.0000 mg | ORAL_TABLET | Freq: Four times a day (QID) | ORAL | 1 refills | Status: DC | PRN

## 2016-11-01 NOTE — Clinical Social Work Note (Signed)
CSW received verbal consult for SNF placement. P/T and O/T rec SNF. However, pt refusing SNF. RNCM aware and following for discharge needs. CSW signing off as no further Social Work needs identified.   Oretha Ellis, [place], [place] Work [facility] Coverage) [phone]

## 2016-11-01 NOTE — Evaluation (Signed)
Occupational Therapy Evaluation Patient Details Name: April Branch MRN: [id number] DOB: Feb 23, 1946 Today's Date: 11/01/2016    History of Present Illness  71 y.o. female with medical history significant of coronary artery disease, chronic kidney disease stage III-IV,chronic pain, hypothyroidism, hypertension, and insulin-dependent diabetes mellitus. Pt admitted with weakness, falls, lightheadedness.  CT (+) Transverse process fractures L1-L4, L 12th rib fx.    Clinical Impression   Pt reports having help for all IADL and sometimes assistance for ADL of her boyfriend. Pt presents with impaired cognition, decreased activity tolerance, back pain and impaired standing balance. She requires min assist for mobility and set up to max assist for ADL. Recommending SNF for further rehab prior to return home. Will follow acutely.    Follow Up Recommendations  SNF    Equipment Recommendations       Recommendations for Other Services       Precautions / Restrictions Precautions Precautions: Fall;Back Precaution Comments: hx of multiple falls in last 3 months, instructed in back precautions Required Braces or Orthoses: Spinal Brace Spinal Brace: Thoracolumbosacral orthotic;Applied in sitting position (may be off for showering) Restrictions Weight Bearing Restrictions: No      Mobility Bed Mobility Overal bed mobility: Needs Assistance Bed Mobility: Rolling;Sidelying to Sit Rolling: Min assist Sidelying to sit: Min assist       General bed mobility comments: cues for log roll technique, assist to initiate rolling and for LEs off EOB  Transfers Overall transfer level: Needs assistance Equipment used: Rolling walker (2 wheeled) Transfers: Sit to/from Stand Sit to Stand: Min assist         General transfer comment: Assist to rise, stabilize, control descent. VCs safety, technique, hand placement.     Balance Overall balance assessment: Needs assistance;History of Falls    Sitting balance-Leahy Scale: Fair     Standing balance support: Bilateral upper extremity supported Standing balance-Leahy Scale: Poor                             ADL either performed or assessed with clinical judgement   ADL Overall ADL's : Needs assistance/impaired Eating/Feeding: Independent;Sitting   Grooming: Min guard;Standing;Wash/dry hands;Brushing hair;Sitting;Set up   Upper Body Bathing: Minimal assistance;Sitting   Lower Body Bathing: Maximal assistance;Sit to/from stand   Upper Body Dressing : Minimal assistance;Sitting;Maximal assistance (max assist back brace)   Lower Body Dressing: Maximal assistance;Sit to/from stand   Toilet Transfer: Minimal assistance;Ambulation;RW   Toileting- Clothing Manipulation and Hygiene: Minimal assistance;Sit to/from stand Toileting - Clothing Manipulation Details (indicate cue type and reason): cues to avoid twisting     Functional mobility during ADLs: Minimal assistance;Rolling walker General ADL Comments: pt with confusion, attempting to manage bed with call button, did not recognize phone     Vision Baseline Vision/History: Wears glasses Wears Glasses: Reading only Patient Visual Report: No change from baseline       Perception     Praxis      Pertinent Vitals/Pain Pain Assessment: 0-10 Pain Score: 10-Worst pain ever Pain Location: back Pain Descriptors / Indicators: Aching;Sore Pain Intervention(s): Monitored during session;Repositioned     Hand Dominance Right   Extremity/Trunk Assessment Upper Extremity Assessment Upper Extremity Assessment: Overall WFL for tasks assessed   Lower Extremity Assessment Lower Extremity Assessment: Defer to PT evaluation   Cervical / Trunk Assessment Cervical / Trunk Assessment: Other exceptions (multiple fractures)   Communication Communication Communication: No difficulties   Cognition Arousal/Alertness: Awake/alert Behavior During Therapy:  WFL for tasks  assessed/performed Overall Cognitive Status: Impaired/Different from baseline Area of Impairment: Safety/judgement;Problem solving;Memory;Orientation                 Orientation Level: Disoriented to;Time   Memory: Decreased short-term memory;Decreased recall of precautions   Safety/Judgement: Decreased awareness of safety;Decreased awareness of deficits   Problem Solving: Slow processing;Difficulty sequencing;Requires verbal cues;Decreased initiation     General Comments       Exercises     Shoulder Instructions      Home Living Family/patient expects to be discharged to:: Private residence Living Arrangements: Spouse/significant other Available Help at Discharge: Family Type of Home: House Home Access: Stairs to enter Technical brewer of Steps: 2 Entrance Stairs-Rails: Can reach both Home Layout: One level     Bathroom Shower/Tub: Corporate investment banker: Standard     Home Equipment: Environmental consultant - 4 wheels          Prior Functioning/Environment Level of Independence: Needs assistance  Gait / Transfers Assistance Needed: no AD but falls often, community ambulator  ADL's / Homemaking Assistance Needed: some assist with ADLs and all IADL            OT Problem List: Decreased strength;Decreased activity tolerance;Impaired balance (sitting and/or standing);Decreased cognition;Decreased safety awareness;Decreased knowledge of use of DME or AE;Decreased knowledge of precautions;Pain      OT Treatment/Interventions: Self-care/ADL training;DME and/or AE instruction;Therapeutic activities;Cognitive remediation/compensation;Patient/family education    OT Goals(Current goals can be found in the care plan section) Acute Rehab OT Goals Patient Stated Goal: go home OT Goal Formulation: With patient Time For Goal Achievement: 11/15/16 Potential to Achieve Goals: Good ADL Goals Pt Will Perform Grooming: with supervision;standing Pt Will  Perform Upper Body Dressing: with supervision;with caregiver independent in assisting;sitting (back brace) Pt Will Perform Lower Body Dressing: with min assist;sit to/from stand Pt Will Transfer to Toilet: with supervision;ambulating;bedside commode (over toilet) Pt Will Perform Toileting - Clothing Manipulation and hygiene: with supervision;sit to/from stand Additional ADL Goal #1: Pt will perform bed mobility modified independently using log roll technique. Additional ADL Goal #2: Pt will identify and gather items necessary for ADL using RW.  OT Frequency: Min 2X/week   Barriers to D/C:            Co-evaluation              AM-PAC PT "6 Clicks" Daily Activity     Outcome Measure Help from another person eating meals?: None Help from another person taking care of personal grooming?: A Little Help from another person toileting, which includes using toliet, bedpan, or urinal?: A Little Help from another person bathing (including washing, rinsing, drying)?: A Lot Help from another person to put on and taking off regular upper body clothing?: A Lot Help from another person to put on and taking off regular lower body clothing?: A Lot 6 Click Score: 16   End of Session Equipment Utilized During Treatment: Gait belt;Rolling walker;Back brace  Activity Tolerance: Patient tolerated treatment well Patient left: in chair;with call bell/phone within reach;with chair alarm set  OT Visit Diagnosis: Unsteadiness on feet (R26.81);Other abnormalities of gait and mobility (R26.89);Muscle weakness (generalized) (M62.81);Pain;Other symptoms and signs involving cognitive function;History of falling (Z91.81)                Time: 8250-5397 OT Time Calculation (min): 29 min Charges:  OT General Charges $OT Visit: 1 Visit OT Evaluation $OT Eval Moderate Complexity: 1 Mod OT Treatments $Self Care/Home Management :  8-22 mins G-Codes:     11/01/2016 Nestor Lewandowsky, OTR/L Pager:  [phone]  Werner Lean Haze Boyden 11/01/2016, 9:53 AM

## 2016-11-01 NOTE — Progress Notes (Signed)
Physical Therapy Treatment Patient Details Name: April Branch MRN: [id number] DOB: 08/05/1945 Today's Date: 11/01/2016    History of Present Illness  71 y.o. female with medical history significant of coronary artery disease, chronic kidney disease stage III-IV,chronic pain, hypothyroidism, hypertension, and insulin-dependent diabetes mellitus. Pt admitted with weakness, falls, lightheadedness.  CT (+) Transverse process fractures L1-L4, L 12th rib fx.     PT Comments    Pt OOB with TLSO applied.  Assisted with amb a limited distance with walker.  Mild unsteady gait and required VC's for proper use esp with turns.   Pt stated she has 5 dogs at home and wishes to D/C to home.  Stated she does have a friend who helps her.     Follow Up Recommendations  SNF;Supervision/Assistance - 24 hour (pending pt )     Equipment Recommendations  None recommended by PT    Recommendations for Other Services       Precautions / Restrictions Precautions Precautions: Fall;Back Precaution Comments: hx of multiple falls in last 3 months, instructed in back precautions Required Braces or Orthoses: Spinal Brace Spinal Brace: Thoracolumbosacral orthotic;Applied in sitting position Restrictions Weight Bearing Restrictions: No    Mobility  Bed Mobility Overal bed mobility: Needs Assistance Bed Mobility: Rolling;Sidelying to Sit Rolling: Min assist Sidelying to sit: Min assist       General bed mobility comments: Pt OOB in recliner  Transfers Overall transfer level: Needs assistance Equipment used: Rolling walker (2 wheeled) Transfers: Sit to/from Stand Sit to Stand: Min guard;Supervision         General transfer comment: Assist to rise, stabilize, control descent. 50% VCs safety, technique, hand placement.   Ambulation/Gait Ambulation/Gait assistance: Min guard;Min assist Ambulation Distance (Feet): 75 Feet Assistive device: Rolling walker (2 wheeled) Gait Pattern/deviations:  Step-through pattern;Decreased stride length     General Gait Details: mild unsteadiness and 25% VC's on proper walker to self distance esp with turns.     Stairs            Wheelchair Mobility    Modified Rankin (Stroke Patients Only)       Balance Overall balance assessment: Needs assistance;History of Falls   Sitting balance-Leahy Scale: Fair     Standing balance support: Bilateral upper extremity supported Standing balance-Leahy Scale: Poor                              Cognition Arousal/Alertness: Awake/alert Behavior During Therapy: WFL for tasks assessed/performed Overall Cognitive Status: Within Functional Limits for tasks assessed Area of Impairment: Safety/judgement;Problem solving;Memory;Orientation                 Orientation Level: Disoriented to;Time   Memory: Decreased short-term memory;Decreased recall of precautions   Safety/Judgement: Decreased awareness of safety;Decreased awareness of deficits   Problem Solving: Slow processing;Difficulty sequencing;Requires verbal cues;Decreased initiation General Comments: HOH      Exercises      General Comments        Pertinent Vitals/Pain Pain Assessment: 0-10 Pain Score: 5  Pain Location: back Pain Descriptors / Indicators: Aching;Sore Pain Intervention(s): Monitored during session;Repositioned    Home Living Family/patient expects to be discharged to:: Private residence Living Arrangements: Spouse/significant other Available Help at Discharge: Family Type of Home: House Home Access: Stairs to enter Entrance Stairs-Rails: Can reach both Home Layout: One level Home Equipment: Walker - 4 wheels      Prior Function Level of Independence: Needs assistance  Gait / Transfers Assistance Needed: no AD but falls often, community ambulator  ADL's / Homemaking Assistance Needed: some assist with ADLs and all IADL     PT Goals (current goals can now be found in the care plan  section) Acute Rehab PT Goals Patient Stated Goal: go home Progress towards PT goals: Progressing toward goals    Frequency    Min 3X/week      PT Plan Current plan remains appropriate    Co-evaluation              AM-PAC PT "6 Clicks" Daily Activity  Outcome Measure  Difficulty turning over in bed (including adjusting bedclothes, sheets and blankets)?: A Little Difficulty moving from lying on back to sitting on the side of the bed? : A Little Difficulty sitting down on and standing up from a chair with arms (e.g., wheelchair, bedside commode, etc,.)?: A Little Help needed moving to and from a bed to chair (including a wheelchair)?: A Little Help needed walking in hospital room?: A Little Help needed climbing 3-5 steps with a railing? : A Lot 6 Click Score: 17    End of Session Equipment Utilized During Treatment: Gait belt Activity Tolerance: Patient tolerated treatment well Patient left: in chair;with call bell/phone within reach Nurse Communication: Mobility status PT Visit Diagnosis: Muscle weakness (generalized) (M62.81);Difficulty in walking, not elsewhere classified (R26.2);Pain     Time: 7622-6333 PT Time Calculation (min) (ACUTE ONLY): 17 min  Charges:  $Gait Training: 8-22 mins                    G Codes:       Rica Koyanagi  PTA [facility]  Acute  Rehab Pager      [phone]

## 2016-11-01 NOTE — Progress Notes (Signed)
Reviewed discharge information with patient and caregiver. Answered all questions. Patient/caregiver able to teach back medications and reasons to contact MD/911. Patient verbalizes importance of PCP follow up appointment.  Shakyia Bosso M. [place], RN  

## 2016-11-01 NOTE — Progress Notes (Signed)
[facility]                                                                              Patient Demographics  April Branch, is a 71 y.o. female, DOB - 1946-02-24, RKY:[id number]  Admit date - 10/30/2016   Admitting Physician Toy Baker, MD  Outpatient Primary MD for the patient is [name], MD  Outpatient specialists:   LOS - 0  days    Chief Complaint  Patient presents with  . Fall  . Back Pain  . Shoulder Pain    left   . Hyperglycemia       Brief summary  April Branch is a 71 y.o. female with medical history significant of coronary artery disease, chronic kidney disease stage III-IV,chronic pain, hypothyroidism, hypertension, and insulin-dependent diabetes mellitus,COPD, anemia, hypothyroidism, presenting with repeated falls and generalized fatigue associated with intermittent lightheadedness. Her DM control had been poor, and there is poss compliance issues.  Assessment & Plan    Active Problems:   Insulin dependent type 2 diabetes mellitus, controlled (HCC)   Essential hypertension   COPD (chronic obstructive pulmonary disease) (HCC)   CAD (coronary artery disease)   Hyperlipidemia   Atherosclerosis of native artery of extremity with intermittent claudication (HCC)   Chronic pain syndrome   Acute kidney injury (nontraumatic) (HCC)   Anemia   CKD (chronic kidney disease), stage III   Diabetic peripheral neuropathy (HCC)   Hyperglycemia   Closed fracture of transverse process of lumbar vertebra (HCC)   Hematoma   Recurrent falls   Syncope  Recurrent Falls/acute fracture of transverse  lumbar spine processes:  Serial Troponin neg Echo 10/31/16-EF 60-65%, mild septal hypertrophy, Diastolic Dysfxn TLCO Brace PT/OT Supportive care  Electrolyte Imbalance: Replete Potassium and Magnesium  DM: A1C 9.2% this admission SSI with Basal Insulin and adjust dose as needed  Hypothyroidism: TSH 64.668 Suspect non-compliance to  synthroid - already on high dose of 137 mcg Repeat TSH in 3 weeks- no dose uptitration for now  PAD/CAD: Stable Plavix held due to hematoma with fractures  Code Status: Full code DVT Prophylaxis:  SCD's Family Communication:    Disposition Plan: SNF - awaiting bed for placement  Time Spent in minutes   35 minutes  Procedures:    Consultants:     Antimicrobials:      Medications  Scheduled Meds: . Chlorhexidine Gluconate Cloth  6 each Topical Q0600  . DULoxetine  60 mg Oral Daily  . fesoterodine  8 mg Oral Daily  . guaiFENesin  600 mg Oral BID  . insulin aspart  0-5 Units Subcutaneous QHS  . insulin aspart  0-9 Units Subcutaneous TID WC  . insulin glargine  30 Units Subcutaneous Daily  . levothyroxine  137 mcg Oral QAC breakfast  . mupirocin ointment  1 application Nasal BID  . nortriptyline  75 mg Oral QHS  . pantoprazole  20 mg Oral Daily  . potassium chloride  10 mEq Oral Daily  . pravastatin  20 mg Oral q1800   Continuous Infusions: . methocarbamol (ROBAXIN)  IV     PRN Meds:.acetaminophen **OR** acetaminophen, albuterol, HYDROcodone-acetaminophen, methocarbamol (ROBAXIN)  IV, ondansetron **OR** ondansetron (ZOFRAN) IV, polyvinyl alcohol   Antibiotics   Anti-infectives    None        Subjective:   April Branch was seen and examined today. Patient denies dizziness, chest pain, shortness of breath, abdominal pain, N/V/D/C, new weakness, numbess, tingling. No acute events overnight.    Objective:   Vitals:   10/31/16 1139 10/31/16 1429 10/31/16 2145 11/01/16 0529  BP: (!) 132/59 124/62 (!) 150/64 (!) 154/77  Pulse: (!) 55 (!) 52 (!) 58 65  Resp: 18 14 16 17   Temp: 97.9 F (36.6 C) 98.3 F (36.8 C) 98.5 F (36.9 C) 98.4 F (36.9 C)  TempSrc: Oral Oral Oral Oral  SpO2: 97% 97% 100% 96%  Weight:      Height:        Intake/Output Summary (Last 24 hours) at 11/01/16 1006 Last data filed at 11/01/16 0600  Gross per 24 hour  Intake               340 ml  Output                0 ml  Net              340 ml     Wt Readings from Last 3 Encounters:  10/31/16 73.7 kg (162 lb 7.7 oz)  10/19/16 77.8 kg (171 lb 8 oz)  10/15/16 79 kg (174 lb 1.6 oz)     Exam  General: NAD  HEENT: NCAT,  PERRL,MMM  Neck: SUPPLE, (-) JVD  Cardiovascular: RRR, (-) GALLOP, (-) MURMUR  Respiratory: CTA  Gastrointestinal: SOFT, (-) DISTENSION, BS(+), (_) TENDERNESS  Ext: (-) CYANOSIS, (-) EDEMA  Neuro: A, OX 3  Skin:(-) RASH  Psych:NORMAL AFFECT/MOOD   Data Reviewed:  I have personally reviewed following labs and imaging studies  Micro Results No results found for this or any previous visit (from the past 240 hour(s)).  Radiology Reports Ct Abdomen Pelvis Wo Contrast  Result Date: 10/30/2016 CLINICAL DATA:  Abdominal pain.  Two falls today. EXAM: CT ABDOMEN AND PELVIS WITHOUT CONTRAST TECHNIQUE: Multidetector CT imaging of the abdomen and pelvis was performed following the standard protocol without IV contrast. COMPARISON:  CT 09/09/2013 FINDINGS: Lower chest: Multi chamber cardiomegaly. Small pericardial effusion. No pleural fluid or consolidation. Nondisplaced fracture of lateral left twelfth rib. Hepatobiliary: Homogeneous attenuation of hepatic parenchyma without focal lesion or evidence of injury. No perihepatic fluid. Gallbladder physiologically distended, no calcified stone. No biliary dilatation. Pancreas: No ductal dilatation or inflammation. Spleen: Normal in size without focal abnormality. Splenule at the hilum. No perisplenic fluid. Adrenals/Urinary Tract: Low-density left adrenal adenoma. Right adrenal gland is normal. Mild perinephric edema about both kidneys, right greater than left, nonspecific. Cortical scarring in the mid left kidney with calcifications. Urinary bladder is physiologically distended. Stomach/Bowel: Descending and sigmoid colonic diverticulosis without diverticulitis. Small to moderate colonic stool  burden. The appendix is surgically absent. Single prominent small bowel loops in the central abdomen is likely spurious, no evidence of obstruction. No bowel inflammation. Vascular/Lymphatic: Dense aortic and branch atherosclerosis. No aneurysm. No abdominal or pelvic adenopathy. Probable left femoral bypass graft incompletely assessed without contrast. Reproductive: Status post hysterectomy. No adnexal masses. Other: Subcutaneous soft tissue edema in the posterior left flank/back. Multiple rounded subcutaneous densities in the region of edema likely hematoma in setting of fall. No intra- abdominal ascites or free air. No retroperitoneal fluid. Musculoskeletal: Nondisplaced left posterior twelfth rib fracture. Mildly displaced left L1 through L4 transverse  process fractures. Vertebral body heights are normal. Bony pelvis is intact. IMPRESSION: 1. Left L1 through L4 transverse process fractures and posterior left nondisplaced twelfth rib fracture. 2. Hematoma and contusion in the left flank/back subcutaneous tissues. 3. No evidence of intra-abdominal/pelvic abnormality. 4. Colonic diverticulosis.  Aortic Atherosclerosis (ICD10-I70.0). Electronically Signed   By: Jeb Levering M.D.   On: 10/30/2016 22:59   Dg Chest 2 View  Result Date: 10/30/2016 CLINICAL DATA:  Two falls today. Intermittent dizziness. Clinical concern for occult infection. EXAM: CHEST  2 VIEW COMPARISON:  Radiograph 10/12/2016 FINDINGS: The cardiomediastinal contours are unchanged with stable cardiomegaly. Right perihilar scarring is stable. Pulmonary vasculature is normal. No consolidation, pleural effusion, or pneumothorax. No acute osseous abnormalities are seen. IMPRESSION: Stable cardiomegaly. No acute abnormality. No evidence of intrathoracic infection. Electronically Signed   By: Jeb Levering M.D.   On: 10/30/2016 19:37   Dg Chest 2 View  Result Date: 10/12/2016 CLINICAL DATA:  Weakness and shortness of breath EXAM: CHEST  2 VIEW  COMPARISON:  Chest radiograph 05/10/2016 FINDINGS: Unchanged cardiomegaly and calcific aortic atherosclerosis. No focal airspace consolidation or pulmonary edema. No pneumothorax or pleural effusion. IMPRESSION: Cardiomegaly without active cardiopulmonary disease. Electronically Signed   By: Ulyses Jarred M.D.   On: 10/12/2016 20:18   Dg Lumbar Spine Complete  Result Date: 10/30/2016 CLINICAL DATA:  Pain after a fall EXAM: LUMBAR SPINE - COMPLETE 4+ VIEW COMPARISON:  None. FINDINGS: Surgical clips in the right upper quadrant. Lateral lumbar alignment within normal limits. Mild dextroscoliosis. Vertebral body heights are maintained. Mild diffuse degenerative changes. Aortic atherosclerosis. IMPRESSION: Mild scoliosis and degenerative changes. No definite acute osseous abnormality. Electronically Signed   By: Donavan Foil M.D.   On: 10/30/2016 18:05   Dg Lumbar Spine Complete  Result Date: 10/04/2016 CLINICAL DATA:  Initial evaluation for recent fall 2 weeks ago, increased back pain and hip pain. EXAM: LUMBAR SPINE - COMPLETE 4+ VIEW COMPARISON:  Prior CT from 09/09/2013. FINDINGS: Five non rib-bearing lumbar type vertebral bodies. Mild dextroscoliosis. Vertebral bodies otherwise normally aligned with preservation of the normal lumbar lordosis. Vertebral body heights maintained. No acute fracture or subluxation. Visualized sacrum intact. Mild multilevel degenerative spondylolysis within the thorax lumbar spine, greatest at T12-L1. Prominent aortic atherosclerosis.  Cholecystectomy clips. IMPRESSION: 1. No radiographic evidence for acute abnormality within the lumbar spine. 2. Mild dextroscoliosis with multilevel degenerative spondylolysis, greatest at T12-L1. 3. Extensive aortic atherosclerosis. Electronically Signed   By: Jeannine Boga M.D.   On: 10/04/2016 19:18   Ct Head Wo Contrast  Result Date: 10/30/2016 CLINICAL DATA:  Two falls today. Recent treatment for MRSA. Intermittent dizziness. EXAM:  CT HEAD WITHOUT CONTRAST CT CERVICAL SPINE WITHOUT CONTRAST TECHNIQUE: Multidetector CT imaging of the head and cervical spine was performed following the standard protocol without intravenous contrast. Multiplanar CT image reconstructions of the cervical spine were also generated. COMPARISON:  Head CT dated 10/12/2016.  Brain MRI dated 08/31/2016. FINDINGS: CT HEAD FINDINGS Brain: There is mild generalized age-related parenchymal atrophy with commensurate dilatation of the ventricles and sulci. There are patchy areas of low density within the bilateral periventricular and subcortical white matter regions, compatible with chronic small vessel ischemic changes described on earlier MRI. There is no mass, hemorrhage, edema or other evidence of acute parenchymal abnormality. No extra-axial hemorrhage. Vascular: There are chronic calcified atherosclerotic changes of the large vessels at the skull base. No unexpected hyperdense vessel. Skull: Normal. Negative for fracture or focal lesion. Sinuses/Orbits: No acute finding. Other: None.  CT CERVICAL SPINE FINDINGS Alignment: No evidence of acute vertebral body subluxation. Straightening of the normal cervical lordosis is likely related to underlying degenerative change. Skull base and vertebrae: No fracture line or displaced fracture fragment identified. Facet joints appear normally aligned. Soft tissues and spinal canal: No prevertebral fluid or swelling. No visible canal hematoma. Disc levels: Mild disc desiccations in the mid and lower cervical spine, as manifested by disc space narrowings and mild osseous spurring. Associated mild disc bulges at the C3-4 through C6-7 levels. At C4-5, the disc bulge is causing moderate central canal stenosis. No more than mild central canal stenosis at the remainder of the levels. Additional degenerative hypertrophy of the uncovertebral and facet joints are causing moderate right neural foramen stenosis and mild left neural foramen  stenosis at C4-5, with possible associated nerve root impingement. Upper chest: No acute findings. Other: Bilateral carotid atherosclerosis. IMPRESSION: 1. No acute intracranial abnormality. No intracranial mass, hemorrhage or edema. No skull fracture. Chronic small vessel ischemic changes within the white matter. 2. No fracture or acute subluxation within the cervical spine. Degenerative changes of the cervical spine, mild to moderate in degree, as detailed above. 3. Carotid atherosclerosis. Electronically Signed   By: Franki Cabot M.D.   On: 10/30/2016 18:22   Ct Head Wo Contrast  Result Date: 10/12/2016 CLINICAL DATA:  Fall, head trauma, weakness EXAM: CT HEAD WITHOUT CONTRAST TECHNIQUE: Contiguous axial images were obtained from the base of the skull through the vertex without intravenous contrast. COMPARISON:  MRI brain dated 09/01/2017 FINDINGS: Brain: No evidence of acute infarction, hemorrhage, hydrocephalus, extra-axial collection or mass lesion/mass effect. Mild subcortical white matter and periventricular small vessel ischemic changes. Vascular: Intracranial atherosclerosis. Skull: Normal. Negative for fracture or focal lesion. Sinuses/Orbits: Partial opacification of the right ethmoid air cells. Prior functional endoscopic sinus surgery. Bilateral mastoid air cells are essentially clear. Other: None. IMPRESSION: No evidence of acute intracranial abnormality. Mild small vessel ischemic changes. Electronically Signed   By: Julian Hy M.D.   On: 10/12/2016 20:02   Ct Cervical Spine Wo Contrast  Result Date: 10/30/2016 CLINICAL DATA:  Two falls today. Recent treatment for MRSA. Intermittent dizziness. EXAM: CT HEAD WITHOUT CONTRAST CT CERVICAL SPINE WITHOUT CONTRAST TECHNIQUE: Multidetector CT imaging of the head and cervical spine was performed following the standard protocol without intravenous contrast. Multiplanar CT image reconstructions of the cervical spine were also generated.  COMPARISON:  Head CT dated 10/12/2016.  Brain MRI dated 08/31/2016. FINDINGS: CT HEAD FINDINGS Brain: There is mild generalized age-related parenchymal atrophy with commensurate dilatation of the ventricles and sulci. There are patchy areas of low density within the bilateral periventricular and subcortical white matter regions, compatible with chronic small vessel ischemic changes described on earlier MRI. There is no mass, hemorrhage, edema or other evidence of acute parenchymal abnormality. No extra-axial hemorrhage. Vascular: There are chronic calcified atherosclerotic changes of the large vessels at the skull base. No unexpected hyperdense vessel. Skull: Normal. Negative for fracture or focal lesion. Sinuses/Orbits: No acute finding. Other: None. CT CERVICAL SPINE FINDINGS Alignment: No evidence of acute vertebral body subluxation. Straightening of the normal cervical lordosis is likely related to underlying degenerative change. Skull base and vertebrae: No fracture line or displaced fracture fragment identified. Facet joints appear normally aligned. Soft tissues and spinal canal: No prevertebral fluid or swelling. No visible canal hematoma. Disc levels: Mild disc desiccations in the mid and lower cervical spine, as manifested by disc space narrowings and mild osseous spurring. Associated mild disc  bulges at the C3-4 through C6-7 levels. At C4-5, the disc bulge is causing moderate central canal stenosis. No more than mild central canal stenosis at the remainder of the levels. Additional degenerative hypertrophy of the uncovertebral and facet joints are causing moderate right neural foramen stenosis and mild left neural foramen stenosis at C4-5, with possible associated nerve root impingement. Upper chest: No acute findings. Other: Bilateral carotid atherosclerosis. IMPRESSION: 1. No acute intracranial abnormality. No intracranial mass, hemorrhage or edema. No skull fracture. Chronic small vessel ischemic  changes within the white matter. 2. No fracture or acute subluxation within the cervical spine. Degenerative changes of the cervical spine, mild to moderate in degree, as detailed above. 3. Carotid atherosclerosis. Electronically Signed   By: Franki Cabot M.D.   On: 10/30/2016 18:22   Dg Shoulder Left  Result Date: 10/30/2016 CLINICAL DATA:  Fall EXAM: LEFT SHOULDER - 2+ VIEW COMPARISON:  None. FINDINGS: No fracture or dislocation. AC joint degenerative change. Left lung apex is clear IMPRESSION: No acute osseous abnormality. Electronically Signed   By: Donavan Foil M.D.   On: 10/30/2016 18:03   Dg Hips Bilat With Pelvis 2v  Result Date: 10/04/2016 CLINICAL DATA:  Initial evaluation for recent fall 2 weeks ago, increase back and left hip pain. EXAM: DG HIP (WITH OR WITHOUT PELVIS) 2V BILAT COMPARISON:  None. FINDINGS: No acute fracture or dislocation about either hip. Femoral heads in normal alignment with the acetabula. Femoral head heights preserved. Bony pelvis intact. SI joints approximated and symmetric. Degenerative osteoarthritic changes present about the hips. Surgical clips overlie the left inguinal region. No acute soft tissue abnormality. IMPRESSION: 1. No acute osseous abnormality about the hips or pelvis. 2. 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 Electronically Signed   By: Jeannine Boga M.D.   On: 10/04/2016 19:22    Lab Data:  CBC:  Recent Labs Lab 10/30/16 1830 10/31/16 0523  WBC 9.6 7.0  NEUTROABS 7.4  --   HGB 11.8* 10.7*  HCT 34.7* 32.6*  MCV 85.7 88.3  PLT 270 979   Basic Metabolic Panel:  Recent Labs Lab 10/30/16 1830 10/31/16 0523  NA 135 138  K 3.6 3.2*  CL 98* 101  CO2 26 28  GLUCOSE 330* 197*  BUN 26* 21*  CREATININE 1.76* 1.57*  CALCIUM 8.9 8.6*  MG  --  1.3*  PHOS  --  3.2   GFR: Estimated Creatinine Clearance: 31.1 mL/min (A) (by C-G formula based on SCr of 1.57 mg/dL (H)). Liver Function Tests:  Recent Labs Lab  10/31/16 0523  AST 16  ALT 13*  ALKPHOS 91  BILITOT 1.0  PROT 7.3  ALBUMIN 3.8   No results for input(s): LIPASE, AMYLASE in the last 168 hours. No results for input(s): AMMONIA in the last 168 hours. Coagulation Profile: No results for input(s): INR, PROTIME in the last 168 hours. Cardiac Enzymes:  Recent Labs Lab 10/30/16 1830 10/31/16 0523 10/31/16 1059 10/31/16 1807  TROPONINI <0.03 <0.03 <0.03 <0.03   BNP (last 3 results) No results for input(s): PROBNP in the last 8760 hours. HbA1C:  Recent Labs  10/31/16 0523  HGBA1C 9.2*   CBG:  Recent Labs Lab 10/31/16 0737 10/31/16 1133 10/31/16 1726 10/31/16 2139 11/01/16 0736  GLUCAP 199* 231* 191* 144* 163*   Lipid Profile: No results for input(s): CHOL, HDL, LDLCALC, TRIG, CHOLHDL, LDLDIRECT in the last 72 hours. Thyroid Function Tests:  Recent Labs  10/31/16 0523  TSH 64.668*   Anemia Panel:  No results for input(s): VITAMINB12, FOLATE, FERRITIN, TIBC, IRON, RETICCTPCT in the last 72 hours. Urine analysis:    Component Value Date/Time   COLORURINE YELLOW 10/30/2016 2010   APPEARANCEUR CLEAR 10/30/2016 2010   LABSPEC 1.016 10/30/2016 2010   PHURINE 5.0 10/30/2016 2010   GLUCOSEU >=500 (A) 10/30/2016 2010   GLUCOSEU >=1000 (A) 12/13/2015 1353   HGBUR MODERATE (A) 10/30/2016 2010   BILIRUBINUR NEGATIVE 10/30/2016 2010   KETONESUR NEGATIVE 10/30/2016 2010   PROTEINUR 30 (A) 10/30/2016 2010   UROBILINOGEN 0.2 12/13/2015 1353   NITRITE NEGATIVE 10/30/2016 2010   LEUKOCYTESUR NEGATIVE 10/30/2016 2010     OSEI-BONSU,Rayann Jolley M.D. [facility] 11/01/2016, 10:06 AM  Pager: [phone] Between 7am to 7pm - call Pager - [phone]  After 7pm go to [email or web address] - password [email or web address]  Call night coverage person covering after 7pm

## 2016-11-01 NOTE — Care Management Note (Signed)
Case Management Note  Patient Details  Name: MENNIE SPILLER MRN: [id number] Date of Birth: [birth year]  Subjective/Objective:  71 y.o. F who adamantly refuses to go to SNF and is however open to [place] and willing to be discharged  home today with HHPT. I assured her that someone from [facility], the agency she has chosen, will be in touch with her to arrange her initial visit.  Within 24-48 hours of discharge. Jermaine, rep for [facility] alerted to probable Discharge.                   Action/Plan:CM will sign off for now but will be available should additional discharge needs arise or disposition change.    Expected Discharge Date:                  Expected Discharge Plan:     In-House Referral:     Discharge planning Services     Post Acute Care Choice:    Choice offered to:     DME Arranged:    DME Agency:     HH Arranged:    HH Agency:     Status of Service:     If discussed at [employer], dates discussed:    Additional Comments:  Delrae Sawyers, RN 11/01/2016, 1:19 PM

## 2016-11-01 NOTE — Discharge Summary (Signed)
April Branch, is a 71 y.o. female  DOB 28-Apr-1945  MRN [id number].  Admission date:  10/30/2016  Admitting Physician  Toy Baker, MD  Discharge Date:  11/01/2016   Primary MD  [name], MD  Recommendations for primary care physician for things to follow:  TSH BMP Resume Plavix after evaluation of hematoma as needed  Admission Diagnosis  Hyperglycemia [R73.9] Multiple contusions [T07.XXXA] Recurrent falls [R29.6] Closed fracture of transverse process of lumbar vertebra, initial encounter ([place]) [S32.009A] Closed fracture of one rib of left side, initial encounter [S22.32XA]   Discharge Diagnosis  Hyperglycemia [R73.9] Multiple contusions [T07.XXXA] Recurrent falls [R29.6] Closed fracture of transverse process of lumbar vertebra, initial encounter ([place]) [S32.009A] Closed fracture of one rib of left side, initial encounter [S22.32XA]    Active Problems:   Insulin dependent type 2 diabetes mellitus, controlled (HCC)   Essential hypertension   COPD (chronic obstructive pulmonary disease) (HCC)   CAD (coronary artery disease)   Hyperlipidemia   Atherosclerosis of native artery of extremity with intermittent claudication (HCC)   Chronic pain syndrome   Acute kidney injury (nontraumatic) (HCC)   Anemia   CKD (chronic kidney disease), stage III   Diabetic peripheral neuropathy (HCC)   Hyperglycemia   Closed fracture of transverse process of lumbar vertebra (HCC)   Hematoma   Recurrent falls   Syncope      Past Medical History:  Diagnosis Date  . Anemia   . Anginal pain ([place])    1 MONTH AGO   . Anxiety   . Arthritis   . CAD (coronary artery disease)   . CAD (coronary artery disease) of bypass graft   . 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 Colon polyps 09/11/2010   Tubular adenoma  . COPD 12/14/2008   PATIENT DENIES    . Depression     . Diabetes mellitus age 42   insulin dependent  . Diabetic coma ([place]) Feb. 2014  . Fall at home Jan. 2, 2016  Jan. 13, 2016   Left knee  . Fibromyalgia   . GERD 07/20/2006  . GERD (gastroesophageal reflux disease)   . Headache(784.0)   . History of transient ischemic attack (TIA)   . HPV (human papilloma virus) infection   . Hyperlipidemia   . Hypertension   . Hypothyroidism   . Insulin dependent type 2 diabetes mellitus, controlled ([place])   . Lumbar disc disease   . Myocardial infarction ([place])    AGE 5    . Neuromuscular disorder ([place])    PERIPHERAL NEUROPATHY  . Peripheral vascular disease ([place])   . Personal history of malignant neoplasm of kidney(V10.52) 12/14/2008   Laparoscopic biopsy and cryoablation 7/08 Dr. Vernie Shanks   . Renal disorder    chronic kidney disease   . Stroke [facility])    3 MINI STROKES  RT SIDED WEAKNESS  . Vertigo     Past Surgical History:  Procedure Laterality Date  . ABDOMINAL AORTAGRAM N/A 08/21/2011  Procedure: ABDOMINAL AORTAGRAM;  Surgeon: Elam Dutch, MD;  Location: [facility] CATH LAB;  Service: Cardiovascular;  Laterality: N/A;  . ABDOMINAL AORTAGRAM N/A 03/16/2014   Procedure: ABDOMINAL Maxcine Ham;  Surgeon: Elam Dutch, MD;  Location: [facility] CATH LAB;  Service: Cardiovascular;  Laterality: N/A;  . ABDOMINAL HYSTERECTOMY    . APPENDECTOMY    . BLADDER SUSPENSION    . CARDIAC CATHETERIZATION    . CHOLECYSTECTOMY OPEN    . ESOPHAGOGASTRODUODENOSCOPY (EGD) WITH PROPOFOL N/A 05/16/2014   Procedure: ESOPHAGOGASTRODUODENOSCOPY (EGD) WITH PROPOFOL with Balloon dilation;  Surgeon: Milus Banister, MD;  Location: [place];  Service: Endoscopy;  Laterality: N/A;  . ESOPHAGOGASTRODUODENOSCOPY (EGD) WITH PROPOFOL N/A 09/02/2015   Procedure: ESOPHAGOGASTRODUODENOSCOPY (EGD) WITH PROPOFOL;  Surgeon: Doran Stabler, MD;  Location: [place];  Service: Gastroenterology;  Laterality: N/A;  . FEMORAL-POPLITEAL BYPASS GRAFT  10/12/2011   Procedure: BYPASS  GRAFT FEMORAL-POPLITEAL ARTERY;  Surgeon: Elam Dutch, MD;  Location[facility] OR;  Service: Vascular;  Laterality: Left;  Left Femoral-Popliteal Bypass Graft using 30mm x 80cm Propaten Graft with intraop arteriogram times one.  . FEMORAL-POPLITEAL BYPASS GRAFT Left 04/17/2014   Procedure: REDO LEFT FEMORAL-POPLITEAL ARTERY BYPASS USING GORE PROPATEN 84mmx80cm GRAFT;  Surgeon: Elam Dutch, MD;  Location: [place];  Service: Vascular;  Laterality: Left;  . FOOT SURGERY Left    "bone spurs"  . INTRAOPERATIVE ARTERIOGRAM  10/12/2011   Procedure: INTRA OPERATIVE ARTERIOGRAM;  Surgeon: Elam Dutch, MD;  Location: [place];  Service: Vascular;  Laterality: Left;  . KNEE ARTHROSCOPY Bilateral    "cartilage"  . Laparascopic cryoablation of left kidney  08/2006   Dr. Gladis Riffle for renal cell cancer  . LEFT HEART CATHETERIZATION WITH CORONARY ANGIOGRAM N/A 05/11/2011   Procedure: LEFT HEART CATHETERIZATION WITH CORONARY ANGIOGRAM;  Surgeon: Jolaine Artist, MD;  Location: [facility] CATH LAB;  Service: Cardiovascular;  Laterality: N/A;  . LUNG SURGERY Right    lung nodule removed from the right side  . PR VEIN BYPASS GRAFT,AORTO-FEM-POP  05/27/2010  . SHOULDER ARTHROSCOPY Bilateral    'Spurs"  . TUBAL LIGATION         HPI  from the history and physical done on the day of admission:    April Branch is a 71 y.o. female with medical history significant of coronary artery disease, chronic kidney disease stage III-IV,chronic pain, hypothyroidism, hypertension, and insulin-dependent diabetes mellitus   COPD, anemia, hypothyroidism  Presented with repeated falls and generalized fatigue she's been feeling intermittently lightheaded she fell today twice once in her back. Reports poor blood sugar control recently. She feels lightheaded when she stands up. Patient have had this 2 episodes of stripping and have had trouble getting up. One fall was witnessed by home health nurse who called EMS. Patient has been  endorsing lower back pain ever since her fall she is unsure about any head trauma. No chest pain or shortness of breath no neurological complaints denies any alcohol abuse. Apparently during her fall  was at home but he was sleeping and didn't hear her fall  Patient have had a similar admission from the 20 to 23rd of August 2018 at that time she was found to be significantly bradycardic hence diffusely weak TSH was elevated 74.5 patient have discontinued her Synthroid her Synthroid was restarted and camera management was consulted and patient was discharged to home with home health PT     Hospital Course:  April Branch a 71 y.o.femalewith medical history significant  of coronary artery disease, chronic kidney disease stage III-IV,chronic pain, hypothyroidism, hypertension, and insulin-dependent diabetes mellitus,COPD, anemia, hypothyroidism, presenting with repeated falls and generalized fatigue associated with intermittent lightheadedness. Her DM control had been poor, and there is poss compliance issues.  Patient was treated supportively and was seen by PT and OT who recommended SNIF placement she adamantly refused and is being discharged home with home health services. She understands the risk of going home and agrees to follow-up with her PCP within 1-2 weeks.    Recurrent Falls/acute fracture of transverse lumbar spine processes:  Serial Troponin neg Echo 10/31/16-EF 60-65%, mild septal hypertrophy, Diastolic Dysfxn TLCO Brace PT/OT Supportive care  Electrolyte Imbalance: Replete Potassium and Magnesium  DM: A1C 9.2% this admission SSI with Basal Insulin and adjust dose as needed  Hypothyroidism: TSH 64.668 Suspect non-compliance to synthroid - already on high dose of 137 mcg Repeat TSH in 3 weeks- no dose up-titration for now  PAD/CAD: Stable Plavix held due to hematoma with fractures       Discharge Condition: Stable and satisfactory  Follow UP -  PCP in 1-2 weeks  Follow-up Information    [facility].   Why:  HHPT has been ordered by your MD and a representative from that agency will be in touch with you after discharge to arrange your initial visit Contact information: [place] [address] [phone]            Consults obtained - Not applicable  Diet and Activity recommendation:  As advised  Discharge Instructions    Discharge Instructions    Call MD for:  difficulty breathing, headache or visual disturbances    Complete by:  As directed    Call MD for:  extreme fatigue    Complete by:  As directed    Call MD for:  hives    Complete by:  As directed    Call MD for:  persistant dizziness or light-headedness    Complete by:  As directed    Call MD for:  persistant nausea and vomiting    Complete by:  As directed    Call MD for:  redness, tenderness, or signs of infection (pain, swelling, redness, odor or green/yellow discharge around incision site)    Complete by:  As directed    Call MD for:  severe uncontrolled pain    Complete by:  As directed    Call MD for:  temperature >100.4    Complete by:  As directed    Diet - low sodium heart healthy    Complete by:  As directed    Increase activity slowly    Complete by:  As directed         Discharge Medications     Allergies as of 11/01/2016   No Known Allergies     Medication List    STOP taking these medications   clopidogrel 75 MG tablet Commonly known as:  PLAVIX     TAKE these medications   ACCU-CHEK FASTCLIX LANCETS Misc Use 4 per day to check blood sugar dx code E11.9   albuterol (2.5 MG/3ML) 0.083% nebulizer solution Commonly known as:  PROVENTIL Take 3 mLs (2.5 mg total) by nebulization every 2 (two) hours as needed for wheezing.   cilostazol 100 MG tablet Commonly known as:  PLETAL Take 1 tablet (100 mg total) by mouth 2 (two) times daily before a meal.   DULoxetine 60 MG  capsule Commonly known  as:  CYMBALTA Take 1 capsule (60 mg total) by mouth daily.   ferrous sulfate 325 (65 FE) MG tablet Take 325 mg by mouth daily with breakfast.   gabapentin 100 MG capsule Commonly known as:  NEURONTIN Take 200 mg by mouth at bedtime.   HYDROcodone-acetaminophen 10-325 MG tablet Commonly known as:  NORCO Take 1 tablet by mouth 3 (three) times daily as needed (for pain).   hydroxypropyl methylcellulose / hypromellose 2.5 % ophthalmic solution Commonly known as:  ISOPTO TEARS / GONIOVISC Place 1 drop into both eyes 4 (four) times daily as needed for dry eyes.   Insulin Glargine 300 UNIT/ML Sopn Commonly known as:  TOUJEO SOLOSTAR Inject 30 Units into the skin daily.   insulin lispro 100 UNIT/ML KiwkPen Commonly known as:  HUMALOG KWIKPEN INJECT 5 UNITS 3 TIMES A DAY AS NEEDED FOR BLOOD SUGAR   Insulin Pen Needle 32G X 4 MM Misc Commonly known as:  BD PEN NEEDLE NANO U/F Use to inject insulin   lansoprazole 30 MG capsule Commonly known as:  PREVACID Take 1 capsule (30 mg total) by mouth daily.   levothyroxine 137 MCG tablet Commonly known as:  SYNTHROID, LEVOTHROID Take 1 tablet (137 mcg total) by mouth daily before breakfast.   LORazepam 1 MG tablet Commonly known as:  ATIVAN Take 1 tablet (1 mg total) by mouth 3 times/day as needed-between meals & bedtime for anxiety.   metFORMIN 500 MG 24 hr tablet Commonly known as:  GLUCOPHAGE-XR Take 500 mg by mouth daily with breakfast. What changed:  Another medication with the same name was removed. Continue taking this medication, and follow the directions you see here.   methocarbamol 500 MG tablet Commonly known as:  ROBAXIN Take 1 tablet (500 mg total) by mouth every 6 (six) hours as needed for muscle spasms.   nortriptyline 75 MG capsule Commonly known as:  PAMELOR Take 75 mg by mouth at bedtime.   potassium chloride 10 MEQ tablet Commonly known as:  KLOR-CON 10 Take 1 tablet (10 mEq total) by  mouth daily.   pravastatin 20 MG tablet Commonly known as:  PRAVACHOL Take 20 mg by mouth daily.   QUEtiapine 100 MG tablet Commonly known as:  SEROQUEL Take 100 mg by mouth at bedtime.   tolterodine 4 MG 24 hr capsule Commonly known as:  DETROL LA Take 4 mg by mouth daily.   trimethoprim-polymyxin b ophthalmic solution Commonly known as:  POLYTRIM Place 1 drop into the right eye every 4 (four) hours.   Vitamin D (Ergocalciferol) 50000 units Caps capsule Commonly known as:  DRISDOL Take 50,000 Units by mouth once a week.            Durable Medical Equipment        Start     Ordered   11/01/16 1501  For home use only DME Walker  [facility])  Once    Question:  Patient needs a walker to treat with the following condition  Answer:  Gait abnormality   11/01/16 1501       Discharge Care Instructions        Start     Ordered   11/01/16 0000  albuterol (PROVENTIL) (2.5 MG/3ML) 0.083% nebulizer solution  Every 2 hours PRN     11/01/16 1501   11/01/16 0000  HYDROcodone-acetaminophen (NORCO) 10-325 MG tablet  3 times daily PRN     11/01/16 1501   11/01/16 0000  Increase activity slowly     11/01/16 1501  11/01/16 0000  Diet - low sodium heart healthy     11/01/16 1501   11/01/16 0000  Call MD for:  temperature >100.4     11/01/16 1501   11/01/16 0000  Call MD for:  persistant nausea and vomiting     11/01/16 1501   11/01/16 0000  Call MD for:  severe uncontrolled pain     11/01/16 1501   11/01/16 0000  Call MD for:  redness, tenderness, or signs of infection (pain, swelling, redness, odor or green/yellow discharge around incision site)     11/01/16 1501   11/01/16 0000  Call MD for:  difficulty breathing, headache or visual disturbances     11/01/16 1501   11/01/16 0000  Call MD for:  hives     11/01/16 1501   11/01/16 0000  Call MD for:  persistant dizziness or light-headedness     11/01/16 1501   11/01/16 0000  Call MD for:  extreme fatigue     11/01/16 1501    11/01/16 0000  methocarbamol (ROBAXIN) 500 MG tablet  Every 6 hours PRN     11/01/16 1501      Major procedures and Radiology Reports - PLEASE review detailed and final reports for all details,   2-D echocardiogram- - Left ventricle: The cavity size was normal. Systolic function was   normal. The estimated ejection fraction was in the range of 60%   to 65%. Wall motion was normal; there were no regional wall   motion abnormalities. Mild focal basal septal hypertrophy.   Diastolic dysfunction, grade indeterminate. Elevated filling   pressures. - Mitral valve: There was mild regurgitation. - Left atrium: The atrium was mildly dilated. - Right ventricle: Systolic function was reduced. - Atrial septum: There was increased thickness of the septum,   consistent with lipomatous hypertrophy. - Pericardium, extracardiac: A trivial pericardial effusion was   identified.   Ct Abdomen Pelvis Wo Contrast  Result Date: 10/30/2016 CLINICAL DATA:  Abdominal pain.  Two falls today. EXAM: CT ABDOMEN AND PELVIS WITHOUT CONTRAST TECHNIQUE: Multidetector CT imaging of the abdomen and pelvis was performed following the standard protocol without IV contrast. COMPARISON:  CT 09/09/2013 FINDINGS: Lower chest: Multi chamber cardiomegaly. Small pericardial effusion. No pleural fluid or consolidation. Nondisplaced fracture of lateral left twelfth rib. Hepatobiliary: Homogeneous attenuation of hepatic parenchyma without focal lesion or evidence of injury. No perihepatic fluid. Gallbladder physiologically distended, no calcified stone. No biliary dilatation. Pancreas: No ductal dilatation or inflammation. Spleen: Normal in size without focal abnormality. Splenule at the hilum. No perisplenic fluid. Adrenals/Urinary Tract: Low-density left adrenal adenoma. Right adrenal gland is normal. Mild perinephric edema about both kidneys, right greater than left, nonspecific. Cortical scarring in the mid left kidney with  calcifications. Urinary bladder is physiologically distended. Stomach/Bowel: Descending and sigmoid colonic diverticulosis without diverticulitis. Small to moderate colonic stool burden. The appendix is surgically absent. Single prominent small bowel loops in the central abdomen is likely spurious, no evidence of obstruction. No bowel inflammation. Vascular/Lymphatic: Dense aortic and branch atherosclerosis. No aneurysm. No abdominal or pelvic adenopathy. Probable left femoral bypass graft incompletely assessed without contrast. Reproductive: Status post hysterectomy. No adnexal masses. Other: Subcutaneous soft tissue edema in the posterior left flank/back. Multiple rounded subcutaneous densities in the region of edema likely hematoma in setting of fall. No intra- abdominal ascites or free air. No retroperitoneal fluid. Musculoskeletal: Nondisplaced left posterior twelfth rib fracture. Mildly displaced left L1 through L4 transverse process fractures. Vertebral body heights are normal.  Bony pelvis is intact. IMPRESSION: 1. Left L1 through L4 transverse process fractures and posterior left nondisplaced twelfth rib fracture. 2. Hematoma and contusion in the left flank/back subcutaneous tissues. 3. No evidence of intra-abdominal/pelvic abnormality. 4. Colonic diverticulosis.  Aortic Atherosclerosis (ICD10-I70.0). Electronically Signed   By: Jeb Levering M.D.   On: 10/30/2016 22:59   Dg Chest 2 View  Result Date: 10/30/2016 CLINICAL DATA:  Two falls today. Intermittent dizziness. Clinical concern for occult infection. EXAM: CHEST  2 VIEW COMPARISON:  Radiograph 10/12/2016 FINDINGS: The cardiomediastinal contours are unchanged with stable cardiomegaly. Right perihilar scarring is stable. Pulmonary vasculature is normal. No consolidation, pleural effusion, or pneumothorax. No acute osseous abnormalities are seen. IMPRESSION: Stable cardiomegaly. No acute abnormality. No evidence of intrathoracic infection.  Electronically Signed   By: Jeb Levering M.D.   On: 10/30/2016 19:37   Dg Chest 2 View  Result Date: 10/12/2016 CLINICAL DATA:  Weakness and shortness of breath EXAM: CHEST  2 VIEW COMPARISON:  Chest radiograph 05/10/2016 FINDINGS: Unchanged cardiomegaly and calcific aortic atherosclerosis. No focal airspace consolidation or pulmonary edema. No pneumothorax or pleural effusion. IMPRESSION: Cardiomegaly without active cardiopulmonary disease. Electronically Signed   By: Ulyses Jarred M.D.   On: 10/12/2016 20:18   Dg Lumbar Spine Complete  Result Date: 10/30/2016 CLINICAL DATA:  Pain after a fall EXAM: LUMBAR SPINE - COMPLETE 4+ VIEW COMPARISON:  None. FINDINGS: Surgical clips in the right upper quadrant. Lateral lumbar alignment within normal limits. Mild dextroscoliosis. Vertebral body heights are maintained. Mild diffuse degenerative changes. Aortic atherosclerosis. IMPRESSION: Mild scoliosis and degenerative changes. No definite acute osseous abnormality. Electronically Signed   By: Donavan Foil M.D.   On: 10/30/2016 18:05   Dg Lumbar Spine Complete  Result Date: 10/04/2016 CLINICAL DATA:  Initial evaluation for recent fall 2 weeks ago, increased back pain and hip pain. EXAM: LUMBAR SPINE - COMPLETE 4+ VIEW COMPARISON:  Prior CT from 09/09/2013. FINDINGS: Five non rib-bearing lumbar type vertebral bodies. Mild dextroscoliosis. Vertebral bodies otherwise normally aligned with preservation of the normal lumbar lordosis. Vertebral body heights maintained. No acute fracture or subluxation. Visualized sacrum intact. Mild multilevel degenerative spondylolysis within the thorax lumbar spine, greatest at T12-L1. Prominent aortic atherosclerosis.  Cholecystectomy clips. IMPRESSION: 1. No radiographic evidence for acute abnormality within the lumbar spine. 2. Mild dextroscoliosis with multilevel degenerative spondylolysis, greatest at T12-L1. 3. Extensive aortic atherosclerosis. Electronically Signed   By:  Jeannine Boga M.D.   On: 10/04/2016 19:18   Ct Head Wo Contrast  Result Date: 10/30/2016 CLINICAL DATA:  Two falls today. Recent treatment for MRSA. Intermittent dizziness. EXAM: CT HEAD WITHOUT CONTRAST CT CERVICAL SPINE WITHOUT CONTRAST TECHNIQUE: Multidetector CT imaging of the head and cervical spine was performed following the standard protocol without intravenous contrast. Multiplanar CT image reconstructions of the cervical spine were also generated. COMPARISON:  Head CT dated 10/12/2016.  Brain MRI dated 08/31/2016. FINDINGS: CT HEAD FINDINGS Brain: There is mild generalized age-related parenchymal atrophy with commensurate dilatation of the ventricles and sulci. There are patchy areas of low density within the bilateral periventricular and subcortical white matter regions, compatible with chronic small vessel ischemic changes described on earlier MRI. There is no mass, hemorrhage, edema or other evidence of acute parenchymal abnormality. No extra-axial hemorrhage. Vascular: There are chronic calcified atherosclerotic changes of the large vessels at the skull base. No unexpected hyperdense vessel. Skull: Normal. Negative for fracture or focal lesion. Sinuses/Orbits: No acute finding. Other: None. CT CERVICAL SPINE FINDINGS Alignment: No evidence  of acute vertebral body subluxation. Straightening of the normal cervical lordosis is likely related to underlying degenerative change. Skull base and vertebrae: No fracture line or displaced fracture fragment identified. Facet joints appear normally aligned. Soft tissues and spinal canal: No prevertebral fluid or swelling. No visible canal hematoma. Disc levels: Mild disc desiccations in the mid and lower cervical spine, as manifested by disc space narrowings and mild osseous spurring. Associated mild disc bulges at the C3-4 through C6-7 levels. At C4-5, the disc bulge is causing moderate central canal stenosis. No more than mild central canal stenosis at  the remainder of the levels. Additional degenerative hypertrophy of the uncovertebral and facet joints are causing moderate right neural foramen stenosis and mild left neural foramen stenosis at C4-5, with possible associated nerve root impingement. Upper chest: No acute findings. Other: Bilateral carotid atherosclerosis. IMPRESSION: 1. No acute intracranial abnormality. No intracranial mass, hemorrhage or edema. No skull fracture. Chronic small vessel ischemic changes within the white matter. 2. No fracture or acute subluxation within the cervical spine. Degenerative changes of the cervical spine, mild to moderate in degree, as detailed above. 3. Carotid atherosclerosis. Electronically Signed   By: Franki Cabot M.D.   On: 10/30/2016 18:22   Ct Head Wo Contrast  Result Date: 10/12/2016 CLINICAL DATA:  Fall, head trauma, weakness EXAM: CT HEAD WITHOUT CONTRAST TECHNIQUE: Contiguous axial images were obtained from the base of the skull through the vertex without intravenous contrast. COMPARISON:  MRI brain dated 09/01/2017 FINDINGS: Brain: No evidence of acute infarction, hemorrhage, hydrocephalus, extra-axial collection or mass lesion/mass effect. Mild subcortical white matter and periventricular small vessel ischemic changes. Vascular: Intracranial atherosclerosis. Skull: Normal. Negative for fracture or focal lesion. Sinuses/Orbits: Partial opacification of the right ethmoid air cells. Prior functional endoscopic sinus surgery. Bilateral mastoid air cells are essentially clear. Other: None. IMPRESSION: No evidence of acute intracranial abnormality. Mild small vessel ischemic changes. Electronically Signed   By: Julian Hy M.D.   On: 10/12/2016 20:02   Ct Cervical Spine Wo Contrast  Result Date: 10/30/2016 CLINICAL DATA:  Two falls today. Recent treatment for MRSA. Intermittent dizziness. EXAM: CT HEAD WITHOUT CONTRAST CT CERVICAL SPINE WITHOUT CONTRAST TECHNIQUE: Multidetector CT imaging of the head  and cervical spine was performed following the standard protocol without intravenous contrast. Multiplanar CT image reconstructions of the cervical spine were also generated. COMPARISON:  Head CT dated 10/12/2016.  Brain MRI dated 08/31/2016. FINDINGS: CT HEAD FINDINGS Brain: There is mild generalized age-related parenchymal atrophy with commensurate dilatation of the ventricles and sulci. There are patchy areas of low density within the bilateral periventricular and subcortical white matter regions, compatible with chronic small vessel ischemic changes described on earlier MRI. There is no mass, hemorrhage, edema or other evidence of acute parenchymal abnormality. No extra-axial hemorrhage. Vascular: There are chronic calcified atherosclerotic changes of the large vessels at the skull base. No unexpected hyperdense vessel. Skull: Normal. Negative for fracture or focal lesion. Sinuses/Orbits: No acute finding. Other: None. CT CERVICAL SPINE FINDINGS Alignment: No evidence of acute vertebral body subluxation. Straightening of the normal cervical lordosis is likely related to underlying degenerative change. Skull base and vertebrae: No fracture line or displaced fracture fragment identified. Facet joints appear normally aligned. Soft tissues and spinal canal: No prevertebral fluid or swelling. No visible canal hematoma. Disc levels: Mild disc desiccations in the mid and lower cervical spine, as manifested by disc space narrowings and mild osseous spurring. Associated mild disc bulges at the C3-4 through C6-7 levels.  At C4-5, the disc bulge is causing moderate central canal stenosis. No more than mild central canal stenosis at the remainder of the levels. Additional degenerative hypertrophy of the uncovertebral and facet joints are causing moderate right neural foramen stenosis and mild left neural foramen stenosis at C4-5, with possible associated nerve root impingement. Upper chest: No acute findings. Other:  Bilateral carotid atherosclerosis. IMPRESSION: 1. No acute intracranial abnormality. No intracranial mass, hemorrhage or edema. No skull fracture. Chronic small vessel ischemic changes within the white matter. 2. No fracture or acute subluxation within the cervical spine. Degenerative changes of the cervical spine, mild to moderate in degree, as detailed above. 3. Carotid atherosclerosis. Electronically Signed   By: Franki Cabot M.D.   On: 10/30/2016 18:22   Dg Shoulder Left  Result Date: 10/30/2016 CLINICAL DATA:  Fall EXAM: LEFT SHOULDER - 2+ VIEW COMPARISON:  None. FINDINGS: No fracture or dislocation. AC joint degenerative change. Left lung apex is clear IMPRESSION: No acute osseous abnormality. Electronically Signed   By: Donavan Foil M.D.   On: 10/30/2016 18:03   Dg Hips Bilat With Pelvis 2v  Result Date: 10/04/2016 CLINICAL DATA:  Initial evaluation for recent fall 2 weeks ago, increase back and left hip pain. EXAM: DG HIP (WITH OR WITHOUT PELVIS) 2V BILAT COMPARISON:  None. FINDINGS: No acute fracture or dislocation about either hip. Femoral heads in normal alignment with the acetabula. Femoral head heights preserved. Bony pelvis intact. SI joints approximated and symmetric. Degenerative osteoarthritic changes present about the hips. Surgical clips overlie the left inguinal region. No acute soft tissue abnormality. IMPRESSION: 1. No acute osseous abnormality about the hips or pelvis. 2. 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 Electronically Signed   By: Jeannine Boga M.D.   On: 10/04/2016 19:22    Micro Results    No results found for this or any previous visit (from the past 240 hour(s)).     Today   Subjective    April Branch was seen today prior to discharge. Please see progress note for details of this 11/01/2016 Objective   Blood pressure (!) 154/77, pulse 65, temperature 98.4 F (36.9 C), temperature source Oral, resp. rate 17, height 5\' 1"   (1.549 m), weight 73.7 kg (162 lb 7.7 oz), SpO2 94 %.    General: NAD  HEENT: NCAT,  PERRL,MMM  Neck: SUPPLE, (-) JVD  Cardiovascular: RRR, (-) GALLOP, (-) MURMUR  Respiratory: CTA  Gastrointestinal: SOFT, (-) DISTENSION, BS(+), (_) TENDERNESS  Ext: (-) CYANOSIS, (-) EDEMA  Neuro: A, OX 3  Skin:(-) RASH  Psych:NORMAL AFFECT/MOOD   Intake/Output Summary (Last 24 hours) at 11/01/16 1502 Last data filed at 11/01/16 0600  Gross per 24 hour  Intake              340 ml  Output                0 ml  Net              340 ml      Data Review   CBC w Diff: Lab Results  Component Value Date   WBC 7.0 10/31/2016   HGB 10.7 (L) 10/31/2016   HCT 32.6 (L) 10/31/2016   PLT 240 10/31/2016   LYMPHOPCT 16 10/30/2016   MONOPCT 5 10/30/2016   EOSPCT 1 10/30/2016   BASOPCT 1 10/30/2016    CMP: Lab Results  Component Value Date   NA 138 10/31/2016   K 3.2 (L) 10/31/2016  CL 101 10/31/2016   CO2 28 10/31/2016   BUN 21 (H) 10/31/2016   CREATININE 1.57 (H) 10/31/2016   PROT 7.3 10/31/2016   ALBUMIN 3.8 10/31/2016   BILITOT 1.0 10/31/2016   ALKPHOS 91 10/31/2016   AST 16 10/31/2016   ALT 13 (L) 10/31/2016  .   Total Discharge time is about 33 minutes  OSEI-BONSU,Leilanny Fluitt M.D on 11/01/2016 at 3:02 PM  [name]   Office  [phone]  Dragon dictation system was used to create this note, attempts have been made to correct errors, however presence of uncorrected errors is not a reflection quality of care provided

## 2016-11-01 NOTE — Progress Notes (Signed)
Initial Nutrition Assessment  DOCUMENTATION CODES:   Obesity unspecified  INTERVENTION:   Provide daily snacks RD will continue to monitor  NUTRITION DIAGNOSIS:   Inadequate oral intake related to acute illness as evidenced by per patient/family report.  GOAL:   Patient will meet greater than or equal to 90% of their needs  MONITOR:   PO intake, Labs, Weight trends, I & O's  REASON FOR ASSESSMENT:   Consult Assessment of nutrition requirement/status  ASSESSMENT:   71 y.o. female with medical history significant of coronary artery disease, chronic kidney disease stage III-IV, chronic pain, hypothyroidism, hypertension, and insulin-dependent diabetes mellitus,COPD, anemia, hypothyroidism, presenting with repeated falls and generalized fatigue associated with intermittent lightheadedness. Her DM control had been poor, and there is poss compliance issues.  Patient suspected of non-compliance with DM maintenance. Pt ate 100% of dinner last night. Pt does not like supplements, will order daily snacks from unit. Pt typically eats 1 meal a day at home.  UBW is 169 lb. Pt has has 7 lb of weight loss. Nutrition focused physical exam shows no sign of depletion of muscle mass or body fat.  Medications: MAG-OX tablet BID, Protonix tablet daily, K-DUR tablet daily Labs reviewed: CBGs: 155-163  Diet Order:  Diet Carb Modified Fluid consistency: Thin; Room service appropriate? Yes  Skin:  Reviewed, no issues  Last BM:  PTA  Height:   Ht Readings from Last 1 Encounters:  10/31/16 5\' 1"  (1.549 m)    Weight:   Wt Readings from Last 1 Encounters:  10/31/16 162 lb 7.7 oz (73.7 kg)    Ideal Body Weight:  47.7 kg  BMI:  Body mass index is 30.7 kg/m.  Estimated Nutritional Needs:   Kcal:  1700-1900  Protein:  85-95g  Fluid:  1.7L/day  EDUCATION NEEDS:   No education needs identified at this time  Clayton Bibles, MS, RD, LDN Pager: [phone] After Hours Pager:  [phone]

## 2016-12-13 ENCOUNTER — Other Ambulatory Visit: Payer: Self-pay | Admitting: Vascular Surgery

## 2016-12-13 DIAGNOSIS — I739 Peripheral vascular disease, unspecified: Secondary | ICD-10-CM

## 2017-03-03 ENCOUNTER — Other Ambulatory Visit: Payer: Self-pay | Admitting: Endocrinology

## 2017-03-30 ENCOUNTER — Ambulatory Visit (INDEPENDENT_AMBULATORY_CARE_PROVIDER_SITE_OTHER)
Admission: RE | Admit: 2017-03-30 | Discharge: 2017-03-30 | Disposition: A | Discharge status: Home / Self Care | Payer: Medicare Other | Source: Ambulatory Visit | Attending: Family | Admitting: Family

## 2017-03-30 ENCOUNTER — Ambulatory Visit (INDEPENDENT_AMBULATORY_CARE_PROVIDER_SITE_OTHER): Payer: Medicare Other | Admitting: Family

## 2017-03-30 ENCOUNTER — Encounter: Payer: Self-pay | Admitting: Family

## 2017-03-30 ENCOUNTER — Ambulatory Visit (HOSPITAL_COMMUNITY)
Admission: RE | Admit: 2017-03-30 | Discharge: 2017-03-30 | Disposition: A | Discharge status: Home / Self Care | Payer: Medicare Other | Source: Ambulatory Visit | Attending: Family | Admitting: Family

## 2017-03-30 VITALS — BP 143/86 | HR 72 | Temp 97.6°F | Resp 20 | Ht 61.0 in | Wt 158.0 lb

## 2017-03-30 DIAGNOSIS — T82898D Other specified complication of vascular prosthetic devices, implants and grafts, subsequent encounter: Secondary | ICD-10-CM | POA: Diagnosis not present

## 2017-03-30 DIAGNOSIS — I6523 Occlusion and stenosis of bilateral carotid arteries: Secondary | ICD-10-CM | POA: Insufficient documentation

## 2017-03-30 DIAGNOSIS — E1151 Type 2 diabetes mellitus with diabetic peripheral angiopathy without gangrene: Secondary | ICD-10-CM | POA: Insufficient documentation

## 2017-03-30 DIAGNOSIS — I70212 Atherosclerosis of native arteries of extremities with intermittent claudication, left leg: Secondary | ICD-10-CM

## 2017-03-30 DIAGNOSIS — Z95828 Presence of other vascular implants and grafts: Secondary | ICD-10-CM

## 2017-03-30 DIAGNOSIS — F17200 Nicotine dependence, unspecified, uncomplicated: Secondary | ICD-10-CM

## 2017-03-30 NOTE — Progress Notes (Signed)
[facility] [place] HISTORY AND PHYSICAL   CC: Follow up peripheral artery occlusive disease and extracranial carotid artery stenosis    History of Present Illness:   April Branch is a 72 y.o. female patient of Dr. Donzetta Matters who is status post left femoropopliteal bypass graft with propaten on 10/12/2011 with subsequent occlusion, and left femoral to below-knee popliteal bypass with propaten on 04/17/2014, by Dr. Oneida Alar.  She also has a history of carotid artery stenosis.   She returns today for follow up. The dogbite wound on her left ankle, medial aspect, has completely healed.  Baseline she has peripheral neuropathy pain in both feet, gabapentin helps slightly with this.  Left hand pain/weakness sx's have been present for years, states this has stablized, she has trouble holding on to objects in her left hand.  She denies non healing wounds. States she had brain imaging about 2010 which demonstrated 3 TIA'S, has intermittent dizziness, has tingling and numbness in both hands, weakness in both hands, moreso in left.   She has known lumbar spine issues with radiculopathy in both legs.   She no longer has dry cracked fissures on both heals. She has no claudication symptoms in her right leg. Her left calf hurts after walking several feet in her house , relieved with rest.  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   She had severe headaches several years ago which prompted brain imaging, states was found she had 3 ministrokes, she does not recall any TIA symptoms other than the headache, denies lateralizing symptoms.  She was hospitalized May 2017 at [facility] for dehydration, does not know how she got dehydrated.   Pt states it has been over a year since she has seen Dr. Johnsie Cancel, has had several syncopal episodes with injuries from  falling.  She is receiving home physical therapy since her last fall and hospitalization.  She fell on her right knee, seen in [facility] ED on 02-26-16, contusion was the diagnosis; states she has been walking less due to this.   Pt states her blood pressure at home runs about 120/87.   Pt Diabetic: Yes, 9.2A1C on 10-31-16(review of records); 8.4 on 05/20/15; Pt smoker: current smoker, 1 ppd (quit January 2016, resumed in 2018, started at age 66 yrs)  Pt meds include:  Statin :Yes ASA: No Other anticoagulants/antiplatelets: Plavix       Current Outpatient Medications  Medication Sig Dispense Refill  . DULoxetine (CYMBALTA) 60 MG capsule TAKE ONE CAPSULE PPO EVERY DAY 90 capsule 1  . ferrous sulfate 325 (65 FE) MG tablet Take 325 mg by mouth daily with breakfast.     . gabapentin (NEURONTIN) 100 MG capsule Take 200 mg by mouth at bedtime.     Marland Kitchen HYDROcodone-acetaminophen (NORCO) 10-325 MG tablet Take 1 tablet by mouth 3 (three) times daily as needed (for pain). 30 tablet 0  . Insulin Glargine (TOUJEO SOLOSTAR) 300 UNIT/ML SOPN Inject 30 Units into the skin daily. (Patient taking differently: Inject 60 Units into the skin daily. ) 14 pen 0  . insulin lispro (HUMALOG KWIKPEN) 100 UNIT/ML KiwkPen INJECT 5 UNITS 3 TIMES A DAY AS NEEDED FOR BLOOD SUGAR (Patient taking differently: 10 Units. INJECT 5 UNITS 3 TIMES A DAY AS NEEDED FOR BLOOD SUGAR) 5 pen 0  . Insulin Pen Needle (BD PEN NEEDLE NANO U/F) 32G  X 4 MM MISC Use to inject insulin 100 each 3  . lansoprazole (PREVACID) 30 MG capsule Take 1 capsule (30 mg total) by mouth daily.    Marland Kitchen levothyroxine (SYNTHROID, LEVOTHROID) 137 MCG tablet Take 1 tablet (137 mcg total) by mouth daily before breakfast. 30 tablet 0  . LORazepam (ATIVAN) 1 MG tablet Take 1 tablet (1 mg total) by mouth 3 times/day as needed-between meals & bedtime for anxiety. 9 tablet 0  . metFORMIN (GLUCOPHAGE-XR) 500 MG 24 hr tablet Take 500 mg by mouth daily with breakfast.    .  nortriptyline (PAMELOR) 75 MG capsule Take 75 mg by mouth at bedtime.    . potassium chloride (KLOR-CON 10) 10 MEQ tablet Take 1 tablet (10 mEq total) by mouth daily. 90 tablet 1  . pravastatin (PRAVACHOL) 20 MG tablet Take 20 mg by mouth daily.    . QUEtiapine (SEROQUEL) 100 MG tablet Take 100 mg by mouth at bedtime.  4  . tolterodine (DETROL LA) 4 MG 24 hr capsule Take 4 mg by mouth daily.     . Vitamin D, Ergocalciferol, (DRISDOL) 50000 units CAPS capsule Take 50,000 Units by mouth once a week.  11  . ACCU-CHEK FASTCLIX LANCETS MISC Use 4 per day to check blood sugar dx code E11.9 (Patient not taking: Reported on 03/30/2017) 102 each 3   No current facility-administered medications for this visit.     Past Medical History:  Diagnosis Date  . Anemia   . Anginal pain ([place])    1 MONTH AGO   . Anxiety   . Arthritis   . CAD (coronary artery disease)   . CAD (coronary artery disease) of bypass graft   . 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 Colon polyps 09/11/2010   Tubular adenoma  . COPD 12/14/2008   PATIENT DENIES    . Depression   . Diabetes mellitus age 75   insulin dependent  . Diabetic coma ([place]) Feb. 2014  . Fall at home Jan. 2, 2016  Jan. 13, 2016   Left knee  . Fibromyalgia   . GERD 07/20/2006  . GERD (gastroesophageal reflux disease)   . Headache(784.0)   . History of transient ischemic attack (TIA)   . HPV (human papilloma virus) infection   . Hyperlipidemia   . Hypertension   . Hypothyroidism   . Insulin dependent type 2 diabetes mellitus, controlled ([place])   . Lumbar disc disease   . Myocardial infarction ([place])    AGE 61    . Neuromuscular disorder ([place])    PERIPHERAL NEUROPATHY  . Peripheral vascular disease ([place])   . Personal history of malignant neoplasm of kidney(V10.52) 12/14/2008   Laparoscopic biopsy and cryoablation 7/08 Dr. Vernie Shanks   . Renal disorder    chronic kidney disease   . Stroke [facility])    3 MINI STROKES   RT SIDED WEAKNESS  . Vertigo     Social History Social History   Tobacco Use  . Smoking status: Current Every Day Smoker    Packs/day: 1.50    Years: 50.00    Pack years: 75.00    Types: Cigarettes    Last attempt to quit: 06/24/2014    Years since quitting: 2.7  . Smokeless tobacco: Never Used  Substance Use Topics  . Alcohol use: No    Alcohol/week: 0.0 oz  . Drug use: No    Family History Family History  Problem Relation Age  of Onset  . Heart disease Mother   . Lung cancer Mother   . Cancer Mother        Lung  . Heart disease Father   . Lung cancer Father   . Cancer Father        Lung  . Heart disease Sister        CABG- Open Heart    Surgical History Past Surgical History:  Procedure Laterality Date  . ABDOMINAL AORTAGRAM N/A 08/21/2011   Procedure: ABDOMINAL Maxcine Ham;  Surgeon: Elam Dutch, MD;  Location: [facility] CATH LAB;  Service: Cardiovascular;  Laterality: N/A;  . ABDOMINAL AORTAGRAM N/A 03/16/2014   Procedure: ABDOMINAL Maxcine Ham;  Surgeon: Elam Dutch, MD;  Location: [facility] CATH LAB;  Service: Cardiovascular;  Laterality: N/A;  . ABDOMINAL HYSTERECTOMY    . APPENDECTOMY    . BLADDER SUSPENSION    . CARDIAC CATHETERIZATION    . CHOLECYSTECTOMY OPEN    . ESOPHAGOGASTRODUODENOSCOPY (EGD) WITH PROPOFOL N/A 05/16/2014   Procedure: ESOPHAGOGASTRODUODENOSCOPY (EGD) WITH PROPOFOL with Balloon dilation;  Surgeon: Milus Banister, MD;  Location: [place];  Service: Endoscopy;  Laterality: N/A;  . ESOPHAGOGASTRODUODENOSCOPY (EGD) WITH PROPOFOL N/A 09/02/2015   Procedure: ESOPHAGOGASTRODUODENOSCOPY (EGD) WITH PROPOFOL;  Surgeon: Doran Stabler, MD;  Location: [place];  Service: Gastroenterology;  Laterality: N/A;  . FEMORAL-POPLITEAL BYPASS GRAFT  10/12/2011   Procedure: BYPASS GRAFT FEMORAL-POPLITEAL ARTERY;  Surgeon: Elam Dutch, MD;  Location[facility] OR;  Service: Vascular;  Laterality: Left;  Left Femoral-Popliteal Bypass Graft using 56mm x 80cm  Propaten Graft with intraop arteriogram times one.  . FEMORAL-POPLITEAL BYPASS GRAFT Left 04/17/2014   Procedure: REDO LEFT FEMORAL-POPLITEAL ARTERY BYPASS USING GORE PROPATEN 42mmx80cm GRAFT;  Surgeon: Elam Dutch, MD;  Location: [place];  Service: Vascular;  Laterality: Left;  . FOOT SURGERY Left    "bone spurs"  . INTRAOPERATIVE ARTERIOGRAM  10/12/2011   Procedure: INTRA OPERATIVE ARTERIOGRAM;  Surgeon: Elam Dutch, MD;  Location: [place];  Service: Vascular;  Laterality: Left;  . KNEE ARTHROSCOPY Bilateral    "cartilage"  . Laparascopic cryoablation of left kidney  08/2006   Dr. Gladis Riffle for renal cell cancer  . LEFT HEART CATHETERIZATION WITH CORONARY ANGIOGRAM N/A 05/11/2011   Procedure: LEFT HEART CATHETERIZATION WITH CORONARY ANGIOGRAM;  Surgeon: Jolaine Artist, MD;  Location[facility] CATH LAB;  Service: Cardiovascular;  Laterality: N/A;  . LUNG SURGERY Right    lung nodule removed from the right side  . PR VEIN BYPASS GRAFT,AORTO-FEM-POP  05/27/2010  . SHOULDER ARTHROSCOPY Bilateral    'Spurs"  . TUBAL LIGATION      No Known Allergies  Current Outpatient Medications  Medication Sig Dispense Refill  . DULoxetine (CYMBALTA) 60 MG capsule TAKE ONE CAPSULE PPO EVERY DAY 90 capsule 1  . ferrous sulfate 325 (65 FE) MG tablet Take 325 mg by mouth daily with breakfast.     . gabapentin (NEURONTIN) 100 MG capsule Take 200 mg by mouth at bedtime.     Marland Kitchen HYDROcodone-acetaminophen (NORCO) 10-325 MG tablet Take 1 tablet by mouth 3 (three) times daily as needed (for pain). 30 tablet 0  . Insulin Glargine (TOUJEO SOLOSTAR) 300 UNIT/ML SOPN Inject 30 Units into the skin daily. (Patient taking differently: Inject 60 Units into the skin daily. ) 14 pen 0  . insulin lispro (HUMALOG KWIKPEN) 100 UNIT/ML KiwkPen INJECT 5 UNITS 3 TIMES A DAY AS NEEDED FOR BLOOD SUGAR (Patient taking differently: 10 Units. INJECT 5 UNITS 3  TIMES A DAY AS NEEDED FOR BLOOD SUGAR) 5 pen 0  . Insulin Pen Needle (BD PEN  NEEDLE NANO U/F) 32G X 4 MM MISC Use to inject insulin 100 each 3  . lansoprazole (PREVACID) 30 MG capsule Take 1 capsule (30 mg total) by mouth daily.    Marland Kitchen levothyroxine (SYNTHROID, LEVOTHROID) 137 MCG tablet Take 1 tablet (137 mcg total) by mouth daily before breakfast. 30 tablet 0  . LORazepam (ATIVAN) 1 MG tablet Take 1 tablet (1 mg total) by mouth 3 times/day as needed-between meals & bedtime for anxiety. 9 tablet 0  . metFORMIN (GLUCOPHAGE-XR) 500 MG 24 hr tablet Take 500 mg by mouth daily with breakfast.    . nortriptyline (PAMELOR) 75 MG capsule Take 75 mg by mouth at bedtime.    . potassium chloride (KLOR-CON 10) 10 MEQ tablet Take 1 tablet (10 mEq total) by mouth daily. 90 tablet 1  . pravastatin (PRAVACHOL) 20 MG tablet Take 20 mg by mouth daily.    . QUEtiapine (SEROQUEL) 100 MG tablet Take 100 mg by mouth at bedtime.  4  . tolterodine (DETROL LA) 4 MG 24 hr capsule Take 4 mg by mouth daily.     . Vitamin D, Ergocalciferol, (DRISDOL) 50000 units CAPS capsule Take 50,000 Units by mouth once a week.  11  . ACCU-CHEK FASTCLIX LANCETS MISC Use 4 per day to check blood sugar dx code E11.9 (Patient not taking: Reported on 03/30/2017) 102 each 3   No current facility-administered medications for this visit.      REVIEW OF SYSTEMS: See HPI for pertinent positives and negatives.  Physical Examination Vitals:   03/30/17 1117 03/30/17 1121  BP: (!) 148/80 (!) 143/86  Pulse: 72   Resp: 20   Temp: 97.6 F (36.4 C)   TempSrc: Oral   SpO2: 95%   Weight: 158 lb (71.7 kg)   Height: 5\' 1"  (1.549 m)    Body mass index is 29.85 kg/m.  General:  A&O x 3, WDWN, female.  Gait: mildly ataxic HENT: No gross abnormalities  Eyes: PERRLA.  Pulmonary: Respirations are non labored, CTAB, no rales, wheezes ,or rhonchi. Cardiac: regular rhythm and rate, no detected murmur.   Carotid Bruits Left Right   Negative  positive   Abdominal aortic pulse is not  palpable.  Radial pulses: right is 2+ palpable, left is faintlypalpable, left brachial pulse is 2+ palpable   VASCULAR EXAM: Extremitieswithout ischemic changes  without Gangrene; without open wounds.   LE Pulses  LEFT  RIGHT   FEMORAL  2+ palpable 2+ palpable   POPLITEAL  not palpable  not palpable  POSTERIOR TIBIAL  Not  palpable  not palpable   DORSALIS PEDIS ANTERIOR TIBIAL  Not  palpable  not palpable    Abdomen: soft, NT, no palpable masses.  Skin: no rashes, no ulcers, see Extremities.  Musculoskeletal: no muscle wasting or atrophy.  Neurologic: A&O X 3; Appropriate Affect; MOTOR FUNCTION: moving all extremities equally, motor strength 5/5 throughout. Speech is slightly slurred. CN 2-12 intact Psychiatric: Normal thought content, mood appropriate to clinical situation.     ASSESSMENT:  April Branch is a 72 y.o. female who is status post left femoropopliteal bypass graft with propaten on 10/12/2011 with subsequent occlusion, left femoral to below-knee popliteal bypass with propaten on 04/17/2014.  She also has a history of mild carotid artery stenosis.   No evidence of old or new cerebral infarct on CT of head in September 2018, see results  below.  September 2016 carotid duplex suggests minimal bilateral ICA stenoses, no significant change from 05/19/13.  She has not been walking much since she is falling with what seems to be frequent syncopal episodes, evaluated at ED several times.  Her atherosclerotic risk factors include uncontrolled DM, 57 years history of active smoking, CAD, and COPD.    I advised her to see Dr. Johnsie Cancel as soon as possible re her several recent syncopal episodes with injuries.    DATA  Carotid Duplex (03/30/17); 40-59% stenosis of bilateral ICA. Bilateral vertebral artery flow is antegrade.  Bilateral subclavian artery waveforms are normal.  Increased  stenosis of bilateral ICA compared to the exam on 10-31-16 at [facility].   ABI (Date: 03/30/2017):  R:   ABI: 0.69 (was 0.83 on 03-27-16),   PT: mono  DP: mono  TBI:  0.67 (was 0.55)  L:   ABI: 0.72 (was 0.71),   PT: mono  DP: bi  TBI: 0.49 (was 0.64)  Moderate decline in right ABI, stable left ABI; moderate disease bilaterally, mono and biphasic waveforms.   CT head w/o contrast (10-30-16): IMPRESSION: 1. No acute intracranial abnormality. No intracranial mass, hemorrhage or edema. No skull fracture. Chronic small vessel ischemic changes within the white matter. 2. No fracture or acute subluxation within the cervical spine. Degenerative changes of the cervical spine, mild to moderate in degree, as detailed above. 3. Carotid atherosclerosis.     PLAN:   Based on today's exam and non-invasive vascular lab results, the patient will follow up in 1 year with the following tests: carotid duplex and ABI's.   The patient was counseled re smoking cessation and given several free resources re smoking cessation.  I discussed in depth with the patient the nature of atherosclerosis, and emphasized the importance of maximal medical management including strict control of blood pressure, blood glucose, and lipid levels, obtaining regular exercise, and cessation of smoking.  The patient is aware that without maximal medical management the underlying atherosclerotic disease process will progress, limiting the benefit of any interventions.  The patient was given information about stroke prevention and what symptoms should prompt the patient to seek immediate medical care.  The patient was given information about PAD including signs, symptoms, treatment, what symptoms should prompt the patient to seek immediate medical care, and risk reduction measures to take.  Thank you for allowing [place] to participate in this patient's care.  Clemon Chambers, RN, MSN, FNP-C [facility] Office:  [phone]  Clinic MD: Early 03/30/2017 11:32 AM

## 2017-03-30 NOTE — Patient Instructions (Signed)
Steps to Quit Smoking Smoking tobacco can be bad for your health. It can also affect almost every organ in your body. Smoking puts you and people around you at risk for many serious long-lasting (chronic) diseases. Quitting smoking is hard, but it is one of the best things that you can do for your health. It is never too late to quit. What are the benefits of quitting smoking? When you quit smoking, you lower your risk for getting serious diseases and conditions. They can include:  Lung cancer or lung disease.  Heart disease.  Stroke.  Heart attack.  Not being able to have children (infertility).  Weak bones (osteoporosis) and broken bones (fractures).  If you have coughing, wheezing, and shortness of breath, those symptoms may get better when you quit. You may also get sick less often. If you are pregnant, quitting smoking can help to lower your chances of having a baby of low birth weight. What can I do to help me quit smoking? Talk with your doctor about what can help you quit smoking. Some things you can do (strategies) include:  Quitting smoking totally, instead of slowly cutting back how much you smoke over a period of time.  Going to in-person counseling. You are more likely to quit if you go to many counseling sessions.  Using resources and support systems, such as: ? Online chats with a counselor. ? Phone quitlines. ? Printed self-help materials. ? Support groups or group counseling. ? Text messaging programs. ? Mobile phone apps or applications.  Taking medicines. Some of these medicines may have nicotine in them. If you are pregnant or breastfeeding, do not take any medicines to quit smoking unless your doctor says it is okay. Talk with your doctor about counseling or other things that can help you.  Talk with your doctor about using more than one strategy at the same time, such as taking medicines while you are also going to in-person counseling. This can help make  quitting easier. What things can I do to make it easier to quit? Quitting smoking might feel very hard at first, but there is a lot that you can do to make it easier. Take these steps:  Talk to your family and friends. Ask them to support and encourage you.  Call phone quitlines, reach out to support groups, or work with a counselor.  Ask people who smoke to not smoke around you.  Avoid places that make you want (trigger) to smoke, such as: ? Bars. ? Parties. ? Smoke-break areas at work.  Spend time with people who do not smoke.  Lower the stress in your life. Stress can make you want to smoke. Try these things to help your stress: ? Getting regular exercise. ? Deep-breathing exercises. ? Yoga. ? Meditating. ? Doing a body scan. To do this, close your eyes, focus on one area of your body at a time from head to toe, and notice which parts of your body are tense. Try to relax the muscles in those areas.  Download or buy apps on your mobile phone or tablet that can help you stick to your quit plan. There are many free apps, such as QuitGuide from the CDC (Centers for Disease Control and Prevention). You can find more support from smokefree.gov and other websites.  This information is not intended to replace advice given to you by your health care provider. Make sure you discuss any questions you have with your health care provider. Document Released: 12/06/2008 Document   Revised: 10/08/2015 Document Reviewed: 06/26/2014 [employer] Interactive Patient Education  2018 [employer]   Stroke Prevention Some health problems and behaviors may make it more likely for you to have a stroke. Below are ways to lessen your risk of having a stroke.  Be active for at least 30 minutes on most or all days.  Do not smoke. Try not to be around others who smoke.  Do not drink too much alcohol. ? Do not have more than 2 drinks a day if you are a man. ? Do not have more than 1 drink a day if you are a  woman and are not pregnant.  Eat healthy foods, such as fruits and vegetables. If you were put on a specific diet, follow the diet as told.  Keep your cholesterol levels under control through diet and medicines. Look for foods that are low in saturated fat, trans fat, cholesterol, and are high in fiber.  If you have diabetes, follow all diet plans and take your medicine as told.  Ask your doctor if you need treatment to lower your blood pressure. If you have high blood pressure (hypertension), follow all diet plans and take your medicine as told by your doctor.  If you are 18-39 years old, have your blood pressure checked every 3-5 years. If you are age 40 or older, have your blood pressure checked every year.  Keep a healthy weight. Eat foods that are low in calories, salt, saturated fat, trans fat, and cholesterol.  Do not take drugs.  Avoid birth control pills, if this applies. Talk to your doctor about the risks of taking birth control pills.  Talk to your doctor if you have sleep problems (sleep apnea).  Take all medicine as told by your doctor. ? You may be told to take aspirin or blood thinner medicine. Take this medicine as told by your doctor. ? Understand your medicine instructions.  Make sure any other conditions you have are being taken care of.  Get help right away if:  You suddenly lose feeling (you feel numb) or have weakness in your face, arm, or leg.  Your face or eyelid hangs down to one side.  You suddenly feel confused.  You have trouble talking (aphasia) or understanding what people are saying.  You suddenly have trouble seeing in one or both eyes.  You suddenly have trouble walking.  You are dizzy.  You lose your balance or your movements are clumsy (uncoordinated).  You suddenly have a very bad headache and you do not know the cause.  You have new chest pain.  Your heart feels like it is fluttering or skipping a beat (irregular heartbeat). Do  not wait to see if the symptoms above go away. Get help right away. Call your local emergency services (911 in [place] Do not drive yourself to the hospital. This information is not intended to replace advice given to you by your health care provider. Make sure you discuss any questions you have with your health care provider. Document Released: 08/11/2011 Document Revised: 07/18/2015 Document Reviewed: 08/12/2012 Elsevier Interactive Patient Education  2018 [employer]     Peripheral Vascular Disease Peripheral vascular disease (PVD) is a disease of the blood vessels that are not part of your heart and brain. A simple term for PVD is poor circulation. In most cases, PVD narrows the blood vessels that carry blood from your heart to the rest of your body. This can result in a decreased supply of blood to   your arms, legs, and internal organs, like your stomach or kidneys. However, it most often affects a person's lower legs and feet. There are two types of PVD.  Organic PVD. This is the more common type. It is caused by damage to the structure of blood vessels.  Functional PVD. This is caused by conditions that make blood vessels contract and tighten (spasm).  Without treatment, PVD tends to get worse over time. PVD can also lead to acute ischemic limb. This is when an arm or limb suddenly has trouble getting enough blood. This is a medical emergency. Follow these instructions at home:  Take medicines only as told by your doctor.  Do not use any tobacco products, including cigarettes, chewing tobacco, or electronic cigarettes. If you need help quitting, ask your doctor.  Lose weight if you are overweight, and maintain a healthy weight as told by your doctor.  Eat a diet that is low in fat and cholesterol. If you need help, ask your doctor.  Exercise regularly. Ask your doctor for some good activities for you.  Take good care of your feet. ? Wear comfortable shoes that fit  well. ? Check your feet often for any cuts or sores. Contact a doctor if:  You have cramps in your legs while walking.  You have leg pain when you are at rest.  You have coldness in a leg or foot.  Your skin changes.  You are unable to get or have an erection (erectile dysfunction).  You have cuts or sores on your feet that are not healing. Get help right away if:  Your arm or leg turns cold and blue.  Your arms or legs become red, warm, swollen, painful, or numb.  You have chest pain or trouble breathing.  You suddenly have weakness in your face, arm, or leg.  You become very confused or you cannot speak.  You suddenly have a very bad headache.  You suddenly cannot see. This information is not intended to replace advice given to you by your health care provider. Make sure you discuss any questions you have with your health care provider. Document Released: 05/06/2009 Document Revised: 07/18/2015 Document Reviewed: 07/20/2013 [employer] Interactive Patient Education  2017 [employer]  

## 2017-03-31 LAB — VAS US CAROTID
LCCADDIAS: -31 cm/s
LCCADSYS: -144 cm/s
LCCAPSYS: 151 cm/s
LEFT ECA DIAS: -14 cm/s
LEFT VERTEBRAL DIAS: -9 cm/s
LICADDIAS: -26 cm/s
LICADSYS: -78 cm/s
LICAPSYS: -171 cm/s
Left CCA prox dias: 23 cm/s
Left ICA prox dias: -44 cm/s
RCCADSYS: -58 cm/s
RIGHT CCA MID DIAS: 17 cm/s
RIGHT ECA DIAS: -9 cm/s
RIGHT VERTEBRAL DIAS: -11 cm/s
Right CCA prox dias: 17 cm/s
Right CCA prox sys: 120 cm/s

## 2017-04-19 NOTE — Progress Notes (Signed)
Cardiology Office Note    Date:  04/20/2017   ID:  Felicidad, Sugarman Feb 09, 1946, MRN [id number]  PCP:  [name], MD  Cardiologist: [name], MD  No chief complaint on file.   History of Present Illness:  April Branch is a 72 y.o. female with nonobstructive CAD cath in 2003 with 30% circumflex.  Nonischemic dobutamine Cardiolite in 2010, cath in 2013 nonobstructive CAD.  Saw Dr. Johnsie Cancel 03/2014 for preop clearance before undergoing repeat left femoropopliteal by Dr. Oneida Alar.  Still using nitroglycerin for chest pains a Lexiscan was ordered and was normal LVEF 78%.  Also has hypertension, hypercholesterolemia, carotid disease and former smoker, CKD.  Hospitalized 10-24-2016 and 10/2016 with falls and significant bradycardia with TSH elevated to 74 after patient had stopped her Synthroid. Was seen by Dr. Skeet Latch 8/22/18who felt bradycardia would improve with correction of hypothyroidism. Metoprolol stopped and HR in 50's. Last carotid Dopplers 03/30/17 40-59% bilaterally.  Patient comes in today accompanied by her friend b/c of chest pain.  Describes a A real bad throb in her throat and then under left armpit and out her nipple. Heart is going fast and skipping. Gets short of breath with little activity and chest tightness relieved with rest. Was falling a lot last summer but hasn't fallen since 2022-10-25. Sister died of MI last month and she's worried about her heart.  Smokes 1 pack of cigarettes a day or more.  Not active because of her PAD.   Past Medical History:  Diagnosis Date  . Anemia   . Anginal pain ([place])    1 MONTH AGO   . Anxiety   . Arthritis   . CAD (coronary artery disease)   . CAD (coronary artery disease) of bypass graft   . 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 Colon polyps 09/11/2010   Tubular adenoma  . COPD 12/14/2008   PATIENT DENIES    . Depression   . Diabetes mellitus age 44   insulin dependent  .  Diabetic coma ([place]) Feb. 2014  . Fall at home Jan. 2, 2016  Jan. 13, 2016   Left knee  . Fibromyalgia   . GERD 07/20/2006  . GERD (gastroesophageal reflux disease)   . Headache(784.0)   . History of transient ischemic attack (TIA)   . HPV (human papilloma virus) infection   . Hyperlipidemia   . Hypertension   . Hypothyroidism   . Insulin dependent type 2 diabetes mellitus, controlled ([place])   . Lumbar disc disease   . Myocardial infarction ([place])    AGE 12    . Neuromuscular disorder ([place])    PERIPHERAL NEUROPATHY  . Peripheral vascular disease ([place])   . Personal history of malignant neoplasm of kidney(V10.52) 12/14/2008   Laparoscopic biopsy and cryoablation 7/08 Dr. Vernie Shanks   . Renal disorder    chronic kidney disease   . Stroke [facility])    3 MINI STROKES  RT SIDED WEAKNESS  . Vertigo     Past Surgical History:  Procedure Laterality Date  . ABDOMINAL AORTAGRAM N/A 08/21/2011   Procedure: ABDOMINAL Maxcine Ham;  Surgeon: Elam Dutch, MD;  Location: [facility] CATH LAB;  Service: Cardiovascular;  Laterality: N/A;  . ABDOMINAL AORTAGRAM N/A 03/16/2014   Procedure: ABDOMINAL Maxcine Ham;  Surgeon: Elam Dutch, MD;  Location: [facility] CATH LAB;  Service: Cardiovascular;  Laterality: N/A;  . ABDOMINAL HYSTERECTOMY    . APPENDECTOMY    .  BLADDER SUSPENSION    . CARDIAC CATHETERIZATION    . CHOLECYSTECTOMY OPEN    . ESOPHAGOGASTRODUODENOSCOPY (EGD) WITH PROPOFOL N/A 05/16/2014   Procedure: ESOPHAGOGASTRODUODENOSCOPY (EGD) WITH PROPOFOL with Balloon dilation;  Surgeon: Milus Banister, MD;  Location: [place];  Service: Endoscopy;  Laterality: N/A;  . ESOPHAGOGASTRODUODENOSCOPY (EGD) WITH PROPOFOL N/A 09/02/2015   Procedure: ESOPHAGOGASTRODUODENOSCOPY (EGD) WITH PROPOFOL;  Surgeon: Doran Stabler, MD;  Location: [place];  Service: Gastroenterology;  Laterality: N/A;  . FEMORAL-POPLITEAL BYPASS GRAFT  10/12/2011   Procedure: BYPASS GRAFT FEMORAL-POPLITEAL ARTERY;  Surgeon: Elam Dutch, MD;  Location[facility] OR;  Service: Vascular;  Laterality: Left;  Left Femoral-Popliteal Bypass Graft using 45mm x 80cm Propaten Graft with intraop arteriogram times one.  . FEMORAL-POPLITEAL BYPASS GRAFT Left 04/17/2014   Procedure: REDO LEFT FEMORAL-POPLITEAL ARTERY BYPASS USING GORE PROPATEN 67mmx80cm GRAFT;  Surgeon: Elam Dutch, MD;  Location: [place];  Service: Vascular;  Laterality: Left;  . FOOT SURGERY Left    "bone spurs"  . INTRAOPERATIVE ARTERIOGRAM  10/12/2011   Procedure: INTRA OPERATIVE ARTERIOGRAM;  Surgeon: Elam Dutch, MD;  Location: [place];  Service: Vascular;  Laterality: Left;  . KNEE ARTHROSCOPY Bilateral    "cartilage"  . Laparascopic cryoablation of left kidney  08/2006   Dr. Gladis Riffle for renal cell cancer  . LEFT HEART CATHETERIZATION WITH CORONARY ANGIOGRAM N/A 05/11/2011   Procedure: LEFT HEART CATHETERIZATION WITH CORONARY ANGIOGRAM;  Surgeon: Jolaine Artist, MD;  Location: [facility] CATH LAB;  Service: Cardiovascular;  Laterality: N/A;  . LUNG SURGERY Right    lung nodule removed from the right side  . PR VEIN BYPASS GRAFT,AORTO-FEM-POP  05/27/2010  . SHOULDER ARTHROSCOPY Bilateral    'Spurs"  . TUBAL LIGATION      Current Medications: Current Meds  Medication Sig  . ACCU-CHEK FASTCLIX LANCETS MISC Use 4 per day to check blood sugar dx code E11.9  . amLODipine (NORVASC) 10 MG tablet Take 10 mg by mouth daily.  . clindamycin (CLEOCIN) 150 MG capsule Take 1 capsule by mouth 2 (two) times daily.  . clopidogrel (PLAVIX) 75 MG tablet Take 75 mg by mouth daily.  . DULoxetine (CYMBALTA) 60 MG capsule TAKE ONE CAPSULE PPO EVERY DAY  . ferrous sulfate 325 (65 FE) MG tablet Take 325 mg by mouth daily with breakfast.   . HYDROcodone-acetaminophen (NORCO) 10-325 MG tablet Take 1 tablet by mouth 3 (three) times daily as needed (for pain).  Marland Kitchen ibuprofen (ADVIL,MOTRIN) 200 MG tablet Take 200 mg by mouth every 6 (six) hours as needed.  . Insulin Glargine (TOUJEO SOLOSTAR)  300 UNIT/ML SOPN Inject 30 Units into the skin daily. (Patient taking differently: Inject 60 Units into the skin daily. )  . insulin lispro (HUMALOG KWIKPEN) 100 UNIT/ML KiwkPen INJECT 5 UNITS 3 TIMES A DAY AS NEEDED FOR BLOOD SUGAR (Patient taking differently: 10 Units. INJECT 5 UNITS 3 TIMES A DAY AS NEEDED FOR BLOOD SUGAR)  . Insulin Pen Needle (BD PEN NEEDLE NANO U/F) 32G X 4 MM MISC Use to inject insulin  . lansoprazole (PREVACID) 30 MG capsule Take 1 capsule (30 mg total) by mouth daily.  Marland Kitchen levothyroxine (SYNTHROID, LEVOTHROID) 175 MCG tablet Take 175 mcg by mouth daily.  Marland Kitchen LORazepam (ATIVAN) 1 MG tablet Take 1 tablet (1 mg total) by mouth 3 times/day as needed-between meals & bedtime for anxiety.  . metFORMIN (GLUCOPHAGE-XR) 500 MG 24 hr tablet Take 500 mg by mouth daily with breakfast.  . nortriptyline (  PAMELOR) 75 MG capsule Take 75 mg by mouth at bedtime.  . potassium chloride (KLOR-CON 10) 10 MEQ tablet Take 1 tablet (10 mEq total) by mouth daily.  . pravastatin (PRAVACHOL) 20 MG tablet Take 20 mg by mouth daily.  . QUEtiapine (SEROQUEL) 100 MG tablet Take 100 mg by mouth at bedtime.  . tolterodine (DETROL LA) 4 MG 24 hr capsule Take 4 mg by mouth daily.   . Vitamin D, Ergocalciferol, (DRISDOL) 50000 units CAPS capsule Take 50,000 Units by mouth once a week.  . [DISCONTINUED] gabapentin (NEURONTIN) 100 MG capsule Take 200 mg by mouth at bedtime.   . [DISCONTINUED] levothyroxine (SYNTHROID, LEVOTHROID) 137 MCG tablet Take 1 tablet (137 mcg total) by mouth daily before breakfast.     Allergies:   Patient has no known allergies.   Social History   Socioeconomic History  . Marital status: Widowed    Spouse name: None  . Number of children: 2  . Years of education: 8th grade  . Highest education level: None  Social Needs  . Financial resource strain: None  . Food insecurity - worry: None  . Food insecurity - inability: None  . Transportation needs - medical: None  .  Transportation needs - non-medical: None  Occupational History  . 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 Smoking status: Current Every Day Smoker    Packs/day: 1.50    Years: 50.00    Pack years: 75.00    Types: Cigarettes    Last attempt to quit: 06/24/2014    Years since quitting: 2.8  . Smokeless tobacco: Never Used  Substance and Sexual Activity  . Alcohol use: No    Alcohol/week: 0.0 oz  . Drug use: No  . Sexual activity: No  Other Topics Concern  . None  Social History Narrative   Widow. Two sons. Husband died of lung cancer.     Right-handed.   She lives at home with her friend, April Branch and her son.   2-4 bottles of Coke per day.           Family History:  The patient's family history includes Cancer in her father and mother; Heart disease in her father, mother, and sister; Lung cancer in her father and mother.   ROS:   Please see the history of present illness.    Review of Systems  Constitution: Negative.  HENT: Negative.   Eyes: Negative.   Cardiovascular: Positive for chest pain, claudication and dyspnea on exertion.  Respiratory: Negative.   Hematologic/Lymphatic: Negative.   Musculoskeletal: Negative.  Negative for joint pain.  Gastrointestinal: Negative.   Genitourinary: Negative.   Neurological: Negative.   Psychiatric/Behavioral: Positive for depression. The patient is nervous/anxious.    All other systems reviewed and are negative.   PHYSICAL EXAM:   VS:  BP (!) 142/74   Pulse 79   Ht 5\' 1"  (1.549 m)   Wt 156 lb (70.8 kg)   BMI 29.48 kg/m   Physical Exam  GEN: Well nourished, well developed, in no acute distress  Neck: Bilateral carotid bruits no JVD,  or masses Cardiac:RRR; distant heart sounds but 1/6 systolic murmur at the left sternal border Respiratory: Decreased breath sounds throughout GI: soft, nontender, nondistended, + BS Ext: without cyanosis, clubbing, or edema, decreased distal pulses bilaterally Neuro:   Alert and Oriented x 3 Psych: euthymic mood, full affect  Wt Readings from Last 3 Encounters:  04/20/17 156 lb (70.8 kg)  03/30/17  158 lb (71.7 kg)  10/31/16 162 lb 7.7 oz (73.7 kg)      Studies/Labs Reviewed:   EKG:  EKG is  ordered today.  The ekg ordered today demonstrates normal sinus rhythm with PACs incomplete right bundle branch block  Recent Labs: 10/12/2016: B Natriuretic Peptide 53.4 10/31/2016: ALT 13; BUN 21; Creatinine, Ser 1.57; Hemoglobin 10.7; Magnesium 1.3; Platelets 240; Potassium 3.2; Sodium 138; TSH 64.668   Lipid Panel    Component Value Date/Time   CHOL 184 10/14/2016 0619   TRIG 169 (H) 10/14/2016 0619   HDL 43 10/14/2016 0619   CHOLHDL 4.3 10/14/2016 0619   VLDL 34 10/14/2016 0619   LDLCALC 107 (H) 10/14/2016 0619   LDLDIRECT 82.0 08/02/2015 1049    Additional studies/ records that were reviewed today include: 2D echo 10/31/16 Study Conclusions   - Left ventricle: The cavity size was normal. Systolic function was   normal. The estimated ejection fraction was in the range of 60%   to 65%. Wall motion was normal; there were no regional wall   motion abnormalities. Mild focal basal septal hypertrophy.   Diastolic dysfunction, grade indeterminate. Elevated filling   pressures. - Mitral valve: There was mild regurgitation. - Left atrium: The atrium was mildly dilated. - Right ventricle: Systolic function was reduced. - Atrial septum: There was increased thickness of the septum,   consistent with lipomatous hypertrophy. - Pericardium, extracardiac: A trivial pericardial effusion was   identified.  Lexiscan 04/16/14 Overall Impression:  Normal stress nuclear study. There is no scar or ischemia. This is a low risk scan.   LV Ejection Fraction: 78%.  LV Wall Motion:  Normal Wall Motion    Daryel November, MD   Lower extremity ABIs 03/30/17 Final Interpretation: Right: Resting right ankle-brachial index indicates moderate right lower extremity arterial  disease. The right toe-brachial index is abnormal. Decreased ankle-brachial index since prior exam of 03/27/2016 Left: Resting left ankle-brachial index indicates moderate left lower extremity arterial disease. The left toe-brachial index is abnormal.   Carotid Dopplers 03/30/17 Final Interpretation: Right Carotid: Velocities in the right ICA are consistent with a 40-59%                stenosis.  Left Carotid: Velocities in the left ICA are consistent with a 40-59% stenosis.     ASSESSMENT:    1. Chest pain, unspecified type   2. Coronary artery disease due to lipid rich plaque   3. Essential hypertension   4. PVD (peripheral vascular disease) ([place])   5. CKD (chronic kidney disease), stage III ([place])   6. DISORDER, TOBACCO USE   7. Pure hypercholesterolemia      PLAN:  In order of problems listed above:  Chest pain and dyspnea on exertion with little activity.  Patient has had nonobstructive CAD on 2 cath and a normal Lexiscan Myoview in 2016 but has all the risk factors for CAD and continues to smoke.  We will repeat Lexiscan Myoview.  Follow-up with Dr. Johnsie Cancel next available.  Palpitations with heart racing and pounding and frequent PACs on today's EKG.  Could be due to stopping the Toprol and correction of her thyroid.  Her last TSH was down to 17 from 74 in November.  We will recheck today and restart low-dose Toprol 25 mg once daily.  CAD nonobstructive on cardiac cath 2003 and again in 2010, normal Lexiscan Myoview 2016  Essential hypertension blood pressure up a little.  Will start Toprol-XL 25 mg daily  PVD status  post femoropopliteal 2016 with recent ABIs showing progression of disease, 40-59% bilateral carotid stenosis-followed by Dr. Oneida Alar  CKD last creatinine 1.57-10/2016  Tobacco abuse continues to smoke heavily.  Long discussion about smoking cessation.  Will prescribe nicotine patches.  Hypercholesterolemia patient is on Pravachol.  We will check fasting lipid  panel today.  Last LDL 107 and 09/2016.    Medication Adjustments/Labs and Tests Ordered: Current medicines are reviewed at length with the patient today.  Concerns regarding medicines are outlined above.  Medication changes, Labs and Tests ordered today are listed in the Patient Instructions below. Patient Instructions  Medication Instructions:  Your physician has recommended you make the following change in your medication:  1-START Metoprolol 25 mg by mouth daily  Labwork: Your physician recommends that you have lab work today- TSH, CMET, CBC, lipid panel   Testing/Procedures: Your physician has recommended that you wear a 48hour holter monitor. Holter monitors are medical devices that record the heart's electrical activity. Doctors most often use these monitors to diagnose arrhythmias. Arrhythmias are problems with the speed or rhythm of the heartbeat. The monitor is a small, portable device. You can wear one while you do your normal daily activities. This is usually used to diagnose what is causing palpitations/syncope (passing out).  Your physician has requested that you have a lexiscan myoview. For further information please visit [email or web address]. Please follow instruction sheet, as given.  Follow-Up: Your physician wants you to follow-up in: next available with Dr. Johnsie Cancel.  If you need a refill on your cardiac medications before your next appointment, please call your pharmacy.   Metoprolol tablets What is this medicine? METOPROLOL (me TOE proe lole) is a beta-blocker. Beta-blockers reduce the workload on the heart and help it to beat more regularly. This medicine is used to treat high blood pressure and to prevent chest pain. It is also used to after a heart attack and to prevent an additional heart attack from occurring. This medicine may be used for other purposes; ask your health care provider or pharmacist if you have questions. COMMON BRAND NAME(S): Lopressor What should  I tell my health care provider before I take this medicine? They need to know if you have any of these conditions: -diabetes -heart or vessel disease like slow heart rate, worsening heart failure, heart block, sick sinus syndrome or Raynaud's disease -kidney disease -liver disease -lung or breathing disease, like asthma or emphysema -pheochromocytoma -thyroid disease -an unusual or allergic reaction to metoprolol, other beta-blockers, medicines, foods, dyes, or preservatives -pregnant or trying to get pregnant -breast-feeding How should I use this medicine? Take this medicine by mouth with a drink of water. Follow the directions on the prescription label. Take this medicine immediately after meals. Take your doses at regular intervals. Do not take more medicine than directed. Do not stop taking this medicine suddenly. This could lead to serious heart-related effects. Talk to your pediatrician regarding the use of this medicine in children. Special care may be needed. Overdosage: If you think you have taken too much of this medicine contact a poison control center or emergency room at once. NOTE: This medicine is only for you. Do not share this medicine with others. What if I miss a dose? If you miss a dose, take it as soon as you can. If it is almost time for your next dose, take only that dose. Do not take double or extra doses. What may interact with this medicine? This medicine may interact with the following medications: -  certain medicines for blood pressure, heart disease, irregular heart beat -certain medicines for depression like monoamine oxidase (MAO) inhibitors, fluoxetine, or paroxetine -clonidine -dobutamine -epinephrine -isoproterenol -reserpine This list may not describe all possible interactions. Give your health care provider a list of all the medicines, herbs, non-prescription drugs, or dietary supplements you use. Also tell them if you smoke, drink alcohol, or use  illegal drugs. Some items may interact with your medicine. What should I watch for while using this medicine? Visit your doctor or health care professional for regular check ups. Contact your doctor right away if your symptoms worsen. Check your blood pressure and pulse rate regularly. Ask your health care professional what your blood pressure and pulse rate should be, and when you should contact them. You may get drowsy or dizzy. Do not drive, use machinery, or do anything that needs mental alertness until you know how this medicine affects you. Do not sit or stand up quickly, especially if you are an older patient. This reduces the risk of dizzy or fainting spells. Contact your doctor if these symptoms continue. Alcohol may interfere with the effect of this medicine. Avoid alcoholic drinks. What side effects may I notice from receiving this medicine? Side effects that you should report to your doctor or health care professional as soon as possible: -allergic reactions like skin rash, itching or hives -cold or numb hands or feet -depression -difficulty breathing -faint -fever with sore throat -irregular heartbeat, chest pain -rapid weight gain -swollen legs or ankles Side effects that usually do not require medical attention (report to your doctor or health care professional if they continue or are bothersome): -anxiety or nervousness -change in sex drive or performance -dry skin -headache -nightmares or trouble sleeping -short term memory loss -stomach upset or diarrhea -unusually tired This list may not describe all possible side effects. Call your doctor for medical advice about side effects. You may report side effects to FDA at 1-800-FDA-1088. Where should I keep my medicine? Keep out of the reach of children. Store at room temperature between 15 and 30 degrees C (59 and 86 degrees F). Throw away any unused medicine after the expiration date. NOTE: This sheet is a summary. It may  not cover all possible information. If you have questions about this medicine, talk to your doctor, pharmacist, or health care provider.  2018 Elsevier/Gold Standard (2012-10-14 14:40:36)  Steps to Quit Smoking Smoking tobacco can be bad for your health. It can also affect almost every organ in your body. Smoking puts you and people around you at risk for many serious long-lasting (chronic) diseases. Quitting smoking is hard, but it is one of the best things that you can do for your health. It is never too late to quit. What are the benefits of quitting smoking? When you quit smoking, you lower your risk for getting serious diseases and conditions. They can include:  Lung cancer or lung disease.  Heart disease.  Stroke.  Heart attack.  Not being able to have children (infertility).  Weak bones (osteoporosis) and broken bones (fractures).  If you have coughing, wheezing, and shortness of breath, those symptoms may get better when you quit. You may also get sick less often. If you are pregnant, quitting smoking can help to lower your chances of having a baby of low birth weight. What can I do to help me quit smoking? Talk with your doctor about what can help you quit smoking. Some things you can do (strategies) include:  Quitting  smoking totally, instead of slowly cutting back how much you smoke over a period of time.  Going to in-person counseling. You are more likely to quit if you go to many counseling sessions.  Using resources and support systems, such as: ? Database administrator with a Social worker. ? Phone quitlines. ? Careers information officer. ? Support groups or group counseling. ? Text messaging programs. ? Mobile phone apps or applications.  Taking medicines. Some of these medicines may have nicotine in them. If you are pregnant or breastfeeding, do not take any medicines to quit smoking unless your doctor says it is okay. Talk with your doctor about counseling or other things  that can help you.  Talk with your doctor about using more than one strategy at the same time, such as taking medicines while you are also going to in-person counseling. This can help make quitting easier. What things can I do to make it easier to quit? Quitting smoking might feel very hard at first, but there is a lot that you can do to make it easier. Take these steps:  Talk to your family and friends. Ask them to support and encourage you.  Call phone quitlines, reach out to support groups, or work with a Social worker.  Ask people who smoke to not smoke around you.  Avoid places that make you want (trigger) to smoke, such as: ? Bars. ? Parties. ? Smoke-break areas at work.  Spend time with people who do not smoke.  Lower the stress in your life. Stress can make you want to smoke. Try these things to help your stress: ? Getting regular exercise. ? Deep-breathing exercises. ? Yoga. ? Meditating. ? Doing a body scan. To do this, close your eyes, focus on one area of your body at a time from head to toe, and notice which parts of your body are tense. Try to relax the muscles in those areas.  Download or buy apps on your mobile phone or tablet that can help you stick to your quit plan. There are many free apps, such as QuitGuide from the [employer] Office manager for Disease Control and Prevention). You can find more support from smokefree.gov and other websites.  This information is not intended to replace advice given to you by your health care provider. Make sure you discuss any questions you have with your health care provider. Document Released: 12/06/2008 Document Revised: 10/08/2015 Document Reviewed: 06/26/2014 Elsevier Interactive Patient Education  2018 [employer]. Nicotine skin patches What is this medicine? NICOTINE ([place] teen) helps people stop smoking. The patches replace the nicotine found in cigarettes and help to decrease withdrawal effects. They are most effective when used in  combination with a stop-smoking program. This medicine may be used for other purposes; ask your health care provider or pharmacist if you have questions. COMMON BRAND NAME(S): Habitrol, Nicoderm CQ, Nicotrol What should I tell my health care provider before I take this medicine? They need to know if you have any of these conditions: -diabetes -heart disease, angina, irregular heartbeat or previous heart attack -high blood pressure -lung disease, including asthma -overactive thyroid -pheochromocytoma -seizures or a history of seizures -skin problems, like eczema -stomach problems or ulcers -an unusual or allergic reaction to nicotine, adhesives, other medicines, foods, dyes, or preservatives -pregnant or trying to get pregnant -breast-feeding How should I use this medicine? This medicine is for use on the skin. Follow the directions that come with the patches. Find an area of skin on your upper arm,  chest, or back that is clean, dry, greaseless, undamaged and hairless. Wash hands with plain soap and water. Do not use anything that contains aloe, lanolin or glycerin as these may prevent the patch from sticking. Dry thoroughly. Remove the patch from the sealed pouch. Do not try to cut or trim the patch. Using your palm, press the patch firmly in place for 10 seconds to make sure that there is good contact with your skin. After applying the patch, wash your hands. Change the patch every day, keeping to a regular schedule. When you apply a new patch, use a new area of skin. Wait at least 1 week before using the same area again. Talk to your pediatrician regarding the use of this medicine in children. Special care may be needed. Overdosage: If you think you have taken too much of this medicine contact a poison control center or emergency room at once. NOTE: This medicine is only for you. Do not share this medicine with others. What if I miss a dose? If you forget to replace a patch, use it as soon  as you can. Only use one patch at a time and do not leave on the skin for longer than directed. If a patch falls off, you can replace it, but keep to your schedule and remove the patch at the right time. What may interact with this medicine? -medicines for asthma -medicines for blood pressure -medicines for mental depression This list may not describe all possible interactions. Give your health care provider a list of all the medicines, herbs, non-prescription drugs, or dietary supplements you use. Also tell them if you smoke, drink alcohol, or use illegal drugs. Some items may interact with your medicine. What should I watch for while using this medicine? You should begin using the nicotine patch the day you stop smoking. It is okay if you do not succeed at your attempt to quit and have a cigarette. You can still continue your quit attempt and keep using the product as directed. Just throw away your cigarettes and get back to your quit plan. You can keep the patch in place during swimming, bathing, and showering. If your patch falls off during these activities, replace it. When you first apply the patch, your skin may itch or burn. This should go away soon. When you remove a patch, the skin may look red, but this should only last for a few days. Call your doctor or health care professional if skin redness does not go away after 4 days, if your skin swells, or if you get a rash. If you are a diabetic and you quit smoking, the effects of insulin may be increased and you may need to reduce your insulin dose. Check with your doctor or health care professional about how you should adjust your insulin dose. If you are going to have a magnetic resonance imaging (MRI) procedure, tell your MRI technician if you have this patch on your body. It must be removed before a MRI. What side effects may I notice from receiving this medicine? Side effects that you should report to your doctor or health care professional  as soon as possible: -allergic reactions like skin rash, itching or hives, swelling of the face, lips, or tongue -breathing problems -changes in hearing -changes in vision -chest pain -cold sweats -confusion -fast, irregular heartbeat -feeling faint or lightheaded, falls -headache -increased saliva -skin redness that lasts more than 4 days -stomach pain -signs and symptoms of nicotine overdose like nausea;  vomiting; dizziness; weakness; and rapid heartbeat Side effects that usually do not require medical attention (report to your doctor or health care professional if they continue or are bothersome): -diarrhea -dry mouth -hiccups -irritability -nervousness or restlessness -trouble sleeping or vivid dreams This list may not describe all possible side effects. Call your doctor for medical advice about side effects. You may report side effects to FDA at 1-800-FDA-1088. Where should I keep my medicine? Keep out of the reach of children. Store at room temperature between 20 and 25 degrees C (68 and 77 degrees F). Protect from heat and light. Store in International aid/development worker until ready to use. Throw away unused medicine after the expiration date. When you remove a patch, fold with sticky sides together; put in an empty opened pouch and throw away. NOTE: This sheet is a summary. It may not cover all possible information. If you have questions about this medicine, talk to your doctor, pharmacist, or health care provider.  2018 Elsevier/Gold Standard (2014-01-08 15:46:21)     Signed, Ermalinda Barrios, PA-C  04/20/2017 10:15 AM    [facility] [address], [place], [place]  [address] Phone: [phone]; Fax: [phone]

## 2017-04-20 ENCOUNTER — Ambulatory Visit (INDEPENDENT_AMBULATORY_CARE_PROVIDER_SITE_OTHER): Payer: Medicare Other | Admitting: Physician Assistant

## 2017-04-20 ENCOUNTER — Encounter: Payer: Self-pay | Admitting: Physician Assistant

## 2017-04-20 VITALS — BP 142/74 | HR 79 | Ht 61.0 in | Wt 156.0 lb

## 2017-04-20 DIAGNOSIS — R079 Chest pain, unspecified: Secondary | ICD-10-CM | POA: Diagnosis not present

## 2017-04-20 DIAGNOSIS — N183 Chronic kidney disease, stage 3 unspecified: Secondary | ICD-10-CM

## 2017-04-20 DIAGNOSIS — I739 Peripheral vascular disease, unspecified: Secondary | ICD-10-CM | POA: Diagnosis not present

## 2017-04-20 DIAGNOSIS — F17200 Nicotine dependence, unspecified, uncomplicated: Secondary | ICD-10-CM

## 2017-04-20 DIAGNOSIS — E7800 Pure hypercholesterolemia, unspecified: Secondary | ICD-10-CM | POA: Diagnosis not present

## 2017-04-20 DIAGNOSIS — I10 Essential (primary) hypertension: Secondary | ICD-10-CM

## 2017-04-20 DIAGNOSIS — I2510 Atherosclerotic heart disease of native coronary artery without angina pectoris: Secondary | ICD-10-CM | POA: Diagnosis not present

## 2017-04-20 DIAGNOSIS — I2583 Coronary atherosclerosis due to lipid rich plaque: Secondary | ICD-10-CM

## 2017-04-20 LAB — LIPID PANEL
CHOL/HDL RATIO: 4.6 ratio — AB (ref 0.0–4.4)
Cholesterol, Total: 219 mg/dL — ABNORMAL HIGH (ref 100–199)
HDL: 48 mg/dL (ref >39)
Triglycerides: 421 mg/dL — ABNORMAL HIGH (ref 0–149)

## 2017-04-20 LAB — COMPREHENSIVE METABOLIC PANEL
ALBUMIN: 4.4 g/dL (ref 3.6–4.8)
ALK PHOS: 120 IU/L — AB (ref 39–117)
ALT: 9 IU/L (ref 0–32)
AST: 8 IU/L (ref 0–40)
Albumin/Globulin Ratio: 1.3 (ref 1.2–2.2)
BUN / CREAT RATIO: 12 (ref 12–28)
BUN: 17 mg/dL (ref 8–27)
Bilirubin Total: 0.2 mg/dL (ref 0.0–1.2)
CO2: 20 mmol/L (ref 20–29)
CREATININE: 1.45 mg/dL — AB (ref 0.57–1.00)
Calcium: 9.6 mg/dL (ref 8.7–10.3)
Chloride: 100 mmol/L (ref 96–106)
GFR calc Af Amer: 42 mL/min/{1.73_m2} — ABNORMAL LOW (ref >59)
GFR calc non Af Amer: 37 mL/min/{1.73_m2} — ABNORMAL LOW (ref >59)
GLUCOSE: 275 mg/dL — AB (ref 65–99)
Globulin, Total: 3.4 g/dL (ref 1.5–4.5)
Potassium: 4.6 mmol/L (ref 3.5–5.2)
Sodium: 138 mmol/L (ref 134–144)
Total Protein: 7.8 g/dL (ref 6.0–8.5)

## 2017-04-20 LAB — CBC WITH DIFFERENTIAL/PLATELET
Basophils Absolute: 0.1 10*3/uL (ref 0.0–0.2)
Basos: 1 %
EOS (ABSOLUTE): 0.1 10*3/uL (ref 0.0–0.4)
Eos: 2 %
HEMATOCRIT: 34.9 % (ref 34.0–46.6)
HEMOGLOBIN: 11.9 g/dL (ref 11.1–15.9)
IMMATURE GRANS (ABS): 0 10*3/uL (ref 0.0–0.1)
IMMATURE GRANULOCYTES: 0 %
LYMPHS: 24 %
Lymphocytes Absolute: 1.9 10*3/uL (ref 0.7–3.1)
MCH: 30.2 pg (ref 26.6–33.0)
MCHC: 34.1 g/dL (ref 31.5–35.7)
MCV: 89 fL (ref 79–97)
MONOCYTES: 6 %
Monocytes Absolute: 0.4 10*3/uL (ref 0.1–0.9)
NEUTROS PCT: 67 %
Neutrophils Absolute: 5.1 10*3/uL (ref 1.4–7.0)
Platelets: 350 10*3/uL (ref 150–379)
RBC: 3.94 x10E6/uL (ref 3.77–5.28)
RDW: 14.7 % (ref 12.3–15.4)
WBC: 7.6 10*3/uL (ref 3.4–10.8)

## 2017-04-20 LAB — TSH: TSH: 14.74 u[IU]/mL — ABNORMAL HIGH (ref 0.450–4.500)

## 2017-04-20 MED ORDER — NICOTINE 21 MG/24HR TD PT24: 21.0000 mg | MEDICATED_PATCH | Freq: Every day | TRANSDERMAL | 0 refills | Status: DC

## 2017-04-20 MED ORDER — METOPROLOL SUCCINATE ER 25 MG PO TB24: 25.0000 mg | ORAL_TABLET | Freq: Every day | ORAL | 3 refills | Status: DC

## 2017-04-20 NOTE — Patient Instructions (Addendum)
Medication Instructions:  Your physician has recommended you make the following change in your medication:  1-START Metoprolol 25 mg by mouth daily  Labwork: Your physician recommends that you have lab work today- TSH, CMET, CBC, lipid panel   Testing/Procedures: Your physician has recommended that you wear a 48hour holter monitor. Holter monitors are medical devices that record the heart's electrical activity. Doctors most often use these monitors to diagnose arrhythmias. Arrhythmias are problems with the speed or rhythm of the heartbeat. The monitor is a small, portable device. You can wear one while you do your normal daily activities. This is usually used to diagnose what is causing palpitations/syncope (passing out).  Your physician has requested that you have a lexiscan myoview. For further information please visit [email or web address]. Please follow instruction sheet, as given.  Follow-Up: Your physician wants you to follow-up in: next available with Dr. Johnsie Cancel.  If you need a refill on your cardiac medications before your next appointment, please call your pharmacy.   Metoprolol tablets What is this medicine? METOPROLOL (me TOE proe lole) is a beta-blocker. Beta-blockers reduce the workload on the heart and help it to beat more regularly. This medicine is used to treat high blood pressure and to prevent chest pain. It is also used to after a heart attack and to prevent an additional heart attack from occurring. This medicine may be used for other purposes; ask your health care provider or pharmacist if you have questions. COMMON BRAND NAME(S): Lopressor What should I tell my health care provider before I take this medicine? They need to know if you have any of these conditions: -diabetes -heart or vessel disease like slow heart rate, worsening heart failure, heart block, sick sinus syndrome or Raynaud's disease -kidney disease -liver disease -lung or breathing disease, like  asthma or emphysema -pheochromocytoma -thyroid disease -an unusual or allergic reaction to metoprolol, other beta-blockers, medicines, foods, dyes, or preservatives -pregnant or trying to get pregnant -breast-feeding How should I use this medicine? Take this medicine by mouth with a drink of water. Follow the directions on the prescription label. Take this medicine immediately after meals. Take your doses at regular intervals. Do not take more medicine than directed. Do not stop taking this medicine suddenly. This could lead to serious heart-related effects. Talk to your pediatrician regarding the use of this medicine in children. Special care may be needed. Overdosage: If you think you have taken too much of this medicine contact a poison control center or emergency room at once. NOTE: This medicine is only for you. Do not share this medicine with others. What if I miss a dose? If you miss a dose, take it as soon as you can. If it is almost time for your next dose, take only that dose. Do not take double or extra doses. What may interact with this medicine? This medicine may interact with the following medications: -certain medicines for blood pressure, heart disease, irregular heart beat -certain medicines for depression like monoamine oxidase (MAO) inhibitors, fluoxetine, or paroxetine -clonidine -dobutamine -epinephrine -isoproterenol -reserpine This list may not describe all possible interactions. Give your health care provider a list of all the medicines, herbs, non-prescription drugs, or dietary supplements you use. Also tell them if you smoke, drink alcohol, or use illegal drugs. Some items may interact with your medicine. What should I watch for while using this medicine? Visit your doctor or health care professional for regular check ups. Contact your doctor right away if your symptoms worsen. Check your  blood pressure and pulse rate regularly. Ask your health care professional what  your blood pressure and pulse rate should be, and when you should contact them. You may get drowsy or dizzy. Do not drive, use machinery, or do anything that needs mental alertness until you know how this medicine affects you. Do not sit or stand up quickly, especially if you are an older patient. This reduces the risk of dizzy or fainting spells. Contact your doctor if these symptoms continue. Alcohol may interfere with the effect of this medicine. Avoid alcoholic drinks. What side effects may I notice from receiving this medicine? Side effects that you should report to your doctor or health care professional as soon as possible: -allergic reactions like skin rash, itching or hives -cold or numb hands or feet -depression -difficulty breathing -faint -fever with sore throat -irregular heartbeat, chest pain -rapid weight gain -swollen legs or ankles Side effects that usually do not require medical attention (report to your doctor or health care professional if they continue or are bothersome): -anxiety or nervousness -change in sex drive or performance -dry skin -headache -nightmares or trouble sleeping -short term memory loss -stomach upset or diarrhea -unusually tired This list may not describe all possible side effects. Call your doctor for medical advice about side effects. You may report side effects to FDA at 1-800-FDA-1088. Where should I keep my medicine? Keep out of the reach of children. Store at room temperature between 15 and 30 degrees C (59 and 86 degrees F). Throw away any unused medicine after the expiration date. NOTE: This sheet is a summary. It may not cover all possible information. If you have questions about this medicine, talk to your doctor, pharmacist, or health care provider.  2018 Elsevier/Gold Standard (2012-10-14 14:40:36)  Steps to Quit Smoking Smoking tobacco can be bad for your health. It can also affect almost every organ in your body. Smoking puts you  and people around you at risk for many serious long-lasting (chronic) diseases. Quitting smoking is hard, but it is one of the best things that you can do for your health. It is never too late to quit. What are the benefits of quitting smoking? When you quit smoking, you lower your risk for getting serious diseases and conditions. They can include:  Lung cancer or lung disease.  Heart disease.  Stroke.  Heart attack.  Not being able to have children (infertility).  Weak bones (osteoporosis) and broken bones (fractures).  If you have coughing, wheezing, and shortness of breath, those symptoms may get better when you quit. You may also get sick less often. If you are pregnant, quitting smoking can help to lower your chances of having a baby of low birth weight. What can I do to help me quit smoking? Talk with your doctor about what can help you quit smoking. Some things you can do (strategies) include:  Quitting smoking totally, instead of slowly cutting back how much you smoke over a period of time.  Going to in-person counseling. You are more likely to quit if you go to many counseling sessions.  Using resources and support systems, such as: ? Database administrator with a Social worker. ? Phone quitlines. ? Careers information officer. ? Support groups or group counseling. ? Text messaging programs. ? Mobile phone apps or applications.  Taking medicines. Some of these medicines may have nicotine in them. If you are pregnant or breastfeeding, do not take any medicines to quit smoking unless your doctor says it is okay.  Talk with your doctor about counseling or other things that can help you.  Talk with your doctor about using more than one strategy at the same time, such as taking medicines while you are also going to in-person counseling. This can help make quitting easier. What things can I do to make it easier to quit? Quitting smoking might feel very hard at first, but there is a lot that  you can do to make it easier. Take these steps:  Talk to your family and friends. Ask them to support and encourage you.  Call phone quitlines, reach out to support groups, or work with a Social worker.  Ask people who smoke to not smoke around you.  Avoid places that make you want (trigger) to smoke, such as: ? Bars. ? Parties. ? Smoke-break areas at work.  Spend time with people who do not smoke.  Lower the stress in your life. Stress can make you want to smoke. Try these things to help your stress: ? Getting regular exercise. ? Deep-breathing exercises. ? Yoga. ? Meditating. ? Doing a body scan. To do this, close your eyes, focus on one area of your body at a time from head to toe, and notice which parts of your body are tense. Try to relax the muscles in those areas.  Download or buy apps on your mobile phone or tablet that can help you stick to your quit plan. There are many free apps, such as QuitGuide from the [employer] Office manager for Disease Control and Prevention). You can find more support from smokefree.gov and other websites.  This information is not intended to replace advice given to you by your health care provider. Make sure you discuss any questions you have with your health care provider. Document Released: 12/06/2008 Document Revised: 10/08/2015 Document Reviewed: 06/26/2014 Elsevier Interactive Patient Education  2018 [employer]. Nicotine skin patches What is this medicine? NICOTINE ([place] teen) helps people stop smoking. The patches replace the nicotine found in cigarettes and help to decrease withdrawal effects. They are most effective when used in combination with a stop-smoking program. This medicine may be used for other purposes; ask your health care provider or pharmacist if you have questions. COMMON BRAND NAME(S): Habitrol, Nicoderm CQ, Nicotrol What should I tell my health care provider before I take this medicine? They need to know if you have any of these  conditions: -diabetes -heart disease, angina, irregular heartbeat or previous heart attack -high blood pressure -lung disease, including asthma -overactive thyroid -pheochromocytoma -seizures or a history of seizures -skin problems, like eczema -stomach problems or ulcers -an unusual or allergic reaction to nicotine, adhesives, other medicines, foods, dyes, or preservatives -pregnant or trying to get pregnant -breast-feeding How should I use this medicine? This medicine is for use on the skin. Follow the directions that come with the patches. Find an area of skin on your upper arm, chest, or back that is clean, dry, greaseless, undamaged and hairless. Wash hands with plain soap and water. Do not use anything that contains aloe, lanolin or glycerin as these may prevent the patch from sticking. Dry thoroughly. Remove the patch from the sealed pouch. Do not try to cut or trim the patch. Using your palm, press the patch firmly in place for 10 seconds to make sure that there is good contact with your skin. After applying the patch, wash your hands. Change the patch every day, keeping to a regular schedule. When you apply a new patch, use a new area  of skin. Wait at least 1 week before using the same area again. Talk to your pediatrician regarding the use of this medicine in children. Special care may be needed. Overdosage: If you think you have taken too much of this medicine contact a poison control center or emergency room at once. NOTE: This medicine is only for you. Do not share this medicine with others. What if I miss a dose? If you forget to replace a patch, use it as soon as you can. Only use one patch at a time and do not leave on the skin for longer than directed. If a patch falls off, you can replace it, but keep to your schedule and remove the patch at the right time. What may interact with this medicine? -medicines for asthma -medicines for blood pressure -medicines for mental  depression This list may not describe all possible interactions. Give your health care provider a list of all the medicines, herbs, non-prescription drugs, or dietary supplements you use. Also tell them if you smoke, drink alcohol, or use illegal drugs. Some items may interact with your medicine. What should I watch for while using this medicine? You should begin using the nicotine patch the day you stop smoking. It is okay if you do not succeed at your attempt to quit and have a cigarette. You can still continue your quit attempt and keep using the product as directed. Just throw away your cigarettes and get back to your quit plan. You can keep the patch in place during swimming, bathing, and showering. If your patch falls off during these activities, replace it. When you first apply the patch, your skin may itch or burn. This should go away soon. When you remove a patch, the skin may look red, but this should only last for a few days. Call your doctor or health care professional if skin redness does not go away after 4 days, if your skin swells, or if you get a rash. If you are a diabetic and you quit smoking, the effects of insulin may be increased and you may need to reduce your insulin dose. Check with your doctor or health care professional about how you should adjust your insulin dose. If you are going to have a magnetic resonance imaging (MRI) procedure, tell your MRI technician if you have this patch on your body. It must be removed before a MRI. What side effects may I notice from receiving this medicine? Side effects that you should report to your doctor or health care professional as soon as possible: -allergic reactions like skin rash, itching or hives, swelling of the face, lips, or tongue -breathing problems -changes in hearing -changes in vision -chest pain -cold sweats -confusion -fast, irregular heartbeat -feeling faint or lightheaded, falls -headache -increased saliva -skin  redness that lasts more than 4 days -stomach pain -signs and symptoms of nicotine overdose like nausea; vomiting; dizziness; weakness; and rapid heartbeat Side effects that usually do not require medical attention (report to your doctor or health care professional if they continue or are bothersome): -diarrhea -dry mouth -hiccups -irritability -nervousness or restlessness -trouble sleeping or vivid dreams This list may not describe all possible side effects. Call your doctor for medical advice about side effects. You may report side effects to FDA at 1-800-FDA-1088. Where should I keep my medicine? Keep out of the reach of children. Store at room temperature between 20 and 25 degrees C (68 and 77 degrees F). Protect from heat and light. Store in  manufacturers packaging until ready to use. Throw away unused medicine after the expiration date. When you remove a patch, fold with sticky sides together; put in an empty opened pouch and throw away. NOTE: This sheet is a summary. It may not cover all possible information. If you have questions about this medicine, talk to your doctor, pharmacist, or health care provider.  2018 Elsevier/Gold Standard (2014-01-08 15:46:21)

## 2017-04-21 NOTE — Progress Notes (Signed)
This encounter was created in error - please disregard.

## 2017-04-21 NOTE — Addendum Note (Signed)
Addended by: Aris Georgia, Muhammed Teutsch L on: 04/21/2017 11:57 AM   Modules accepted: Orders

## 2017-04-22 ENCOUNTER — Emergency Department (HOSPITAL_COMMUNITY): Payer: Medicare Other

## 2017-04-22 ENCOUNTER — Other Ambulatory Visit: Payer: Self-pay

## 2017-04-22 ENCOUNTER — Encounter (HOSPITAL_COMMUNITY): Payer: Self-pay | Admitting: Emergency Medicine

## 2017-04-22 ENCOUNTER — Observation Stay (HOSPITAL_COMMUNITY)
Admission: EM | Admit: 2017-04-22 | Discharge: 2017-04-24 | Disposition: A | Discharge status: Home / Self Care | Payer: Medicare Other | Attending: Internal Medicine | Admitting: Internal Medicine

## 2017-04-22 DIAGNOSIS — Z85528 Personal history of other malignant neoplasm of kidney: Secondary | ICD-10-CM | POA: Diagnosis not present

## 2017-04-22 DIAGNOSIS — N183 Chronic kidney disease, stage 3 (moderate): Secondary | ICD-10-CM | POA: Diagnosis not present

## 2017-04-22 DIAGNOSIS — E860 Dehydration: Principal | ICD-10-CM | POA: Insufficient documentation

## 2017-04-22 DIAGNOSIS — J449 Chronic obstructive pulmonary disease, unspecified: Secondary | ICD-10-CM | POA: Insufficient documentation

## 2017-04-22 DIAGNOSIS — R531 Weakness: Secondary | ICD-10-CM | POA: Diagnosis present

## 2017-04-22 DIAGNOSIS — E1122 Type 2 diabetes mellitus with diabetic chronic kidney disease: Secondary | ICD-10-CM | POA: Insufficient documentation

## 2017-04-22 DIAGNOSIS — Z794 Long term (current) use of insulin: Secondary | ICD-10-CM | POA: Insufficient documentation

## 2017-04-22 DIAGNOSIS — I739 Peripheral vascular disease, unspecified: Secondary | ICD-10-CM | POA: Insufficient documentation

## 2017-04-22 DIAGNOSIS — Z7902 Long term (current) use of antithrombotics/antiplatelets: Secondary | ICD-10-CM | POA: Diagnosis not present

## 2017-04-22 DIAGNOSIS — R0600 Dyspnea, unspecified: Secondary | ICD-10-CM

## 2017-04-22 DIAGNOSIS — E1140 Type 2 diabetes mellitus with diabetic neuropathy, unspecified: Secondary | ICD-10-CM | POA: Insufficient documentation

## 2017-04-22 DIAGNOSIS — E119 Type 2 diabetes mellitus without complications: Secondary | ICD-10-CM

## 2017-04-22 DIAGNOSIS — Z79899 Other long term (current) drug therapy: Secondary | ICD-10-CM | POA: Insufficient documentation

## 2017-04-22 DIAGNOSIS — I10 Essential (primary) hypertension: Secondary | ICD-10-CM | POA: Diagnosis present

## 2017-04-22 DIAGNOSIS — I129 Hypertensive chronic kidney disease with stage 1 through stage 4 chronic kidney disease, or unspecified chronic kidney disease: Secondary | ICD-10-CM | POA: Insufficient documentation

## 2017-04-22 DIAGNOSIS — I2510 Atherosclerotic heart disease of native coronary artery without angina pectoris: Secondary | ICD-10-CM | POA: Insufficient documentation

## 2017-04-22 DIAGNOSIS — F17210 Nicotine dependence, cigarettes, uncomplicated: Secondary | ICD-10-CM | POA: Insufficient documentation

## 2017-04-22 DIAGNOSIS — R42 Dizziness and giddiness: Secondary | ICD-10-CM

## 2017-04-22 DIAGNOSIS — E039 Hypothyroidism, unspecified: Secondary | ICD-10-CM | POA: Insufficient documentation

## 2017-04-22 LAB — CBC
HEMATOCRIT: 30.2 % — AB (ref 36.0–46.0)
HEMOGLOBIN: 10 g/dL — AB (ref 12.0–15.0)
MCH: 29.5 pg (ref 26.0–34.0)
MCHC: 33.1 g/dL (ref 30.0–36.0)
MCV: 89.1 fL (ref 78.0–100.0)
Platelets: 310 10*3/uL (ref 150–400)
RBC: 3.39 MIL/uL — ABNORMAL LOW (ref 3.87–5.11)
RDW: 14.2 % (ref 11.5–15.5)
WBC: 8.8 10*3/uL (ref 4.0–10.5)

## 2017-04-22 LAB — COMPREHENSIVE METABOLIC PANEL
ALT: 12 U/L — AB (ref 14–54)
AST: 18 U/L (ref 15–41)
Albumin: 3.5 g/dL (ref 3.5–5.0)
Alkaline Phosphatase: 92 U/L (ref 38–126)
Anion gap: 11 (ref 5–15)
BILIRUBIN TOTAL: 0.6 mg/dL (ref 0.3–1.2)
BUN: 26 mg/dL — AB (ref 6–20)
CHLORIDE: 105 mmol/L (ref 101–111)
CO2: 19 mmol/L — ABNORMAL LOW (ref 22–32)
Calcium: 8.6 mg/dL — ABNORMAL LOW (ref 8.9–10.3)
Creatinine, Ser: 1.85 mg/dL — ABNORMAL HIGH (ref 0.44–1.00)
GFR calc Af Amer: 31 mL/min — ABNORMAL LOW (ref >60)
GFR calc non Af Amer: 27 mL/min — ABNORMAL LOW (ref >60)
Glucose, Bld: 160 mg/dL — ABNORMAL HIGH (ref 65–99)
POTASSIUM: 4.2 mmol/L (ref 3.5–5.1)
Sodium: 135 mmol/L (ref 135–145)
Total Protein: 6.6 g/dL (ref 6.5–8.1)

## 2017-04-22 LAB — DIFFERENTIAL
BASOS ABS: 0.1 10*3/uL (ref 0.0–0.1)
BASOS PCT: 1 %
EOS ABS: 0.1 10*3/uL (ref 0.0–0.7)
Eosinophils Relative: 2 %
Lymphocytes Relative: 34 %
Lymphs Abs: 3 10*3/uL (ref 0.7–4.0)
MONOS PCT: 5 %
Monocytes Absolute: 0.4 10*3/uL (ref 0.1–1.0)
NEUTROS ABS: 5.2 10*3/uL (ref 1.7–7.7)
NEUTROS PCT: 58 %

## 2017-04-22 LAB — I-STAT CHEM 8, ED
BUN: 29 mg/dL — AB (ref 6–20)
CHLORIDE: 106 mmol/L (ref 101–111)
CREATININE: 1.8 mg/dL — AB (ref 0.44–1.00)
Calcium, Ion: 1.14 mmol/L — ABNORMAL LOW (ref 1.15–1.40)
Glucose, Bld: 154 mg/dL — ABNORMAL HIGH (ref 65–99)
HCT: 29 % — ABNORMAL LOW (ref 36.0–46.0)
HEMOGLOBIN: 9.9 g/dL — AB (ref 12.0–15.0)
POTASSIUM: 4.2 mmol/L (ref 3.5–5.1)
Sodium: 139 mmol/L (ref 135–145)
TCO2: 21 mmol/L — ABNORMAL LOW (ref 22–32)

## 2017-04-22 LAB — PROTIME-INR
INR: 0.97
Prothrombin Time: 12.8 seconds (ref 11.4–15.2)

## 2017-04-22 LAB — APTT: APTT: 28 s (ref 24–36)

## 2017-04-22 LAB — ETHANOL: Alcohol, Ethyl (B): <10 mg/dL (ref <10)

## 2017-04-22 LAB — CBG MONITORING, ED: GLUCOSE-CAPILLARY: 176 mg/dL — AB (ref 65–99)

## 2017-04-22 LAB — I-STAT CG4 LACTIC ACID, ED: Lactic Acid, Venous: 1.91 mmol/L — ABNORMAL HIGH (ref 0.5–1.9)

## 2017-04-22 LAB — I-STAT TROPONIN, ED: Troponin i, poc: 0 ng/mL (ref 0.00–0.08)

## 2017-04-22 MED ORDER — SODIUM CHLORIDE 0.9 % IV BOLUS (SEPSIS)
500.0000 mL | Freq: Once | INTRAVENOUS | Status: AC
  Administered 2017-04-23: 500 mL via INTRAVENOUS

## 2017-04-22 MED ORDER — SODIUM CHLORIDE 0.9 % IV BOLUS (SEPSIS)
500.0000 mL | Freq: Once | INTRAVENOUS | Status: AC
  Administered 2017-04-22: 500 mL via INTRAVENOUS

## 2017-04-22 NOTE — ED Notes (Signed)
Patient remains in MRI at this time.

## 2017-04-22 NOTE — ED Notes (Signed)
On way to MRI 

## 2017-04-22 NOTE — ED Provider Notes (Signed)
[facility] DEPARTMENT Provider Note   CSN: [id number] Arrival date & time: 04/22/17  2046     History   Chief Complaint Chief Complaint  Patient presents with  . Weakness    HPI April Branch is a 72 y.o. female.  The history is provided by the patient and the EMS personnel. No language interpreter was used.  Weakness     April Branch is a 72 y.o. female who presents to the Emergency Department complaining of weakness.   History is provided by the patient and EMS.  Patient reports headache that began around 4 PM this evening.  It is occipital in nature.  EMS reports altered level of consciousness and lethargy today.  Patient reports right-sided weakness that occurred when her headache began.  She reports similar symptoms in the past with TIA.  No fevers, abdominal pain, nausea, vomiting, recent medication changes.  She does endorse some intermittent mild chest pain.  No new medications or missed doses of medications.  She endorses 3 days of itching as well.  Past Medical History:  Diagnosis Date  . Anemia   . Anginal pain ([place])    1 MONTH AGO   . Anxiety   . Arthritis   . CAD (coronary artery disease)   . CAD (coronary artery disease) of bypass graft   . 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 Colon polyps 09/11/2010   Tubular adenoma  . COPD 12/14/2008   PATIENT DENIES    . Depression   . Diabetes mellitus age 41   insulin dependent  . Diabetic coma ([place]) Feb. 2014  . Fall at home Jan. 2, 2016  Jan. 13, 2016   Left knee  . Fibromyalgia   . GERD 07/20/2006  . GERD (gastroesophageal reflux disease)   . Headache(784.0)   . History of transient ischemic attack (TIA)   . HPV (human papilloma virus) infection   . Hyperlipidemia   . Hypertension   . Hypothyroidism   . Insulin dependent type 2 diabetes mellitus, controlled ([place])   . Lumbar disc disease   . Myocardial infarction ([place])    AGE  52    . Neuromuscular disorder ([place])    PERIPHERAL NEUROPATHY  . Peripheral vascular disease ([place])   . Personal history of malignant neoplasm of kidney(V10.52) 12/14/2008   Laparoscopic biopsy and cryoablation 7/08 Dr. Vernie Shanks   . Renal disorder    chronic kidney disease   . Stroke [facility])    3 MINI STROKES  RT SIDED WEAKNESS  . Vertigo     Patient Active Problem List   Diagnosis Date Noted  . Hyperglycemia 10/31/2016  . Closed fracture of transverse process of lumbar vertebra ([place]) 10/31/2016  . Hematoma 10/31/2016  . Recurrent falls 10/31/2016  . Syncope 10/31/2016  . Gait abnormality 10/19/2016  . Diabetic peripheral neuropathy ([place]) 10/19/2016  . Symptomatic bradycardia 10/13/2016  . Elevated troponin   . Bradycardia 10/12/2016  . General weakness 10/12/2016  . CKD (chronic kidney disease), stage III ([place]) 10/12/2016  . Weakness   . Orthostatic syncope 05/10/2016  . Colitis 05/10/2016  . Anemia 05/10/2016  . Hypomagnesemia 05/10/2016  . Food impaction of esophagus 09/02/2015  . Diabetic foot ulcer ([place]) 06/24/2015  . Atheroscler of bypass graft of left leg with intermittent claudication ([place]) 04/17/2014  . Carotid stenosis 05/19/2013  . Chest pain 09/21/2012  . Acute kidney injury (nontraumatic) ([place]) 09/21/2012  .  Chronic pain syndrome 03/13/2012  . Acute kidney injury ([place]) 03/13/2012  . Atherosclerosis of native artery of extremity with intermittent claudication ([place]) 10/08/2011  . PVD (peripheral vascular disease) ([place]) 07/30/2011  . CAD (coronary artery disease) 05/10/2011  . Hyperlipidemia   . Unstable angina ([place]) 05/09/2011  . CERVICAL CANCER 12/14/2008  . Personal history of malignant neoplasm of kidney(V10.52) 12/14/2008  . COPD (chronic obstructive pulmonary disease) ([place]) 12/14/2008  . IBS 12/14/2008  . Essential hypertension   . Personal history of colonic polyps 08/13/2008  . DISORDER, TOBACCO USE 08/04/2006  . Hypothyroidism   . Insulin dependent  type 2 diabetes mellitus, controlled ([place])   . Lumbar disc disease   . ANXIETY STATE NOS 07/20/2006  . GERD 07/20/2006  . HX, PERSONAL, VENOUS THROMBOSIS/EMBOLISM 07/20/2006    Past Surgical History:  Procedure Laterality Date  . ABDOMINAL AORTAGRAM N/A 08/21/2011   Procedure: ABDOMINAL Maxcine Ham;  Surgeon: Elam Dutch, MD;  Location: [facility] CATH LAB;  Service: Cardiovascular;  Laterality: N/A;  . ABDOMINAL AORTAGRAM N/A 03/16/2014   Procedure: ABDOMINAL Maxcine Ham;  Surgeon: Elam Dutch, MD;  Location: [facility] CATH LAB;  Service: Cardiovascular;  Laterality: N/A;  . ABDOMINAL HYSTERECTOMY    . APPENDECTOMY    . BLADDER SUSPENSION    . CARDIAC CATHETERIZATION    . CHOLECYSTECTOMY OPEN    . ESOPHAGOGASTRODUODENOSCOPY (EGD) WITH PROPOFOL N/A 05/16/2014   Procedure: ESOPHAGOGASTRODUODENOSCOPY (EGD) WITH PROPOFOL with Balloon dilation;  Surgeon: Milus Banister, MD;  Location: [facility];  Service: Endoscopy;  Laterality: N/A;  . ESOPHAGOGASTRODUODENOSCOPY (EGD) WITH PROPOFOL N/A 09/02/2015   Procedure: ESOPHAGOGASTRODUODENOSCOPY (EGD) WITH PROPOFOL;  Surgeon: Doran Stabler, MD;  Location: [facility];  Service: Gastroenterology;  Laterality: N/A;  . FEMORAL-POPLITEAL BYPASS GRAFT  10/12/2011   Procedure: BYPASS GRAFT FEMORAL-POPLITEAL ARTERY;  Surgeon: Elam Dutch, MD;  Location: [facility] OR;  Service: Vascular;  Laterality: Left;  Left Femoral-Popliteal Bypass Graft using 38mm x 80cm Propaten Graft with intraop arteriogram times one.  . FEMORAL-POPLITEAL BYPASS GRAFT Left 04/17/2014   Procedure: REDO LEFT FEMORAL-POPLITEAL ARTERY BYPASS USING GORE PROPATEN 74mmx80cm GRAFT;  Surgeon: Elam Dutch, MD;  Location: [facility];  Service: Vascular;  Laterality: Left;  . FOOT SURGERY Left    "bone spurs"  . INTRAOPERATIVE ARTERIOGRAM  10/12/2011   Procedure: INTRA OPERATIVE ARTERIOGRAM;  Surgeon: Elam Dutch, MD;  Location: [facility];  Service: Vascular;  Laterality: Left;  . KNEE ARTHROSCOPY  Bilateral    "cartilage"  . Laparascopic cryoablation of left kidney  08/2006   Dr. Gladis Riffle for renal cell cancer  . LEFT HEART CATHETERIZATION WITH CORONARY ANGIOGRAM N/A 05/11/2011   Procedure: LEFT HEART CATHETERIZATION WITH CORONARY ANGIOGRAM;  Surgeon: Jolaine Artist, MD;  Location: [facility] CATH LAB;  Service: Cardiovascular;  Laterality: N/A;  . LUNG SURGERY Right    lung nodule removed from the right side  . PR VEIN BYPASS GRAFT,AORTO-FEM-POP  05/27/2010  . SHOULDER ARTHROSCOPY Bilateral    'Spurs"  . TUBAL LIGATION      OB History    No data available       Home Medications    Prior to Admission medications   Medication Sig Start Date End Date Taking? Authorizing Provider  ACCU-CHEK FASTCLIX LANCETS MISC Use 4 per day to check blood sugar dx code E11.9 11/29/14   Elayne Snare, MD  amLODipine (NORVASC) 10 MG tablet Take 10 mg by mouth daily. 04/06/17   [provider]  clindamycin (CLEOCIN) 150 MG capsule  Take 1 capsule by mouth 2 (two) times daily. 03/29/17   [provider]  clopidogrel (PLAVIX) 75 MG tablet Take 75 mg by mouth daily.    [provider]  DULoxetine (CYMBALTA) 60 MG capsule TAKE ONE CAPSULE PPO EVERY DAY 03/03/17   Elayne Snare, MD  ferrous sulfate 325 (65 FE) MG tablet Take 325 mg by mouth daily with breakfast.     [provider]  HYDROcodone-acetaminophen (NORCO) 10-325 MG tablet Take 1 tablet by mouth 3 (three) times daily as needed (for pain). 11/01/16   Benito Mccreedy, MD  ibuprofen (ADVIL,MOTRIN) 200 MG tablet Take 200 mg by mouth every 6 (six) hours as needed.    [provider]  Insulin Glargine (TOUJEO SOLOSTAR) 300 UNIT/ML SOPN Inject 30 Units into the skin daily. Patient taking differently: Inject 60 Units into the skin daily.  10/15/16   Patrecia Pour, MD  insulin lispro (HUMALOG KWIKPEN) 100 UNIT/ML KiwkPen INJECT 5 UNITS 3 TIMES A DAY AS NEEDED FOR BLOOD SUGAR Patient taking differently: 10 Units. INJECT 5  UNITS 3 TIMES A DAY AS NEEDED FOR BLOOD SUGAR 10/15/16   Patrecia Pour, MD  Insulin Pen Needle (BD PEN NEEDLE NANO U/F) 32G X 4 MM MISC Use to inject insulin 10/30/14   Elayne Snare, MD  lansoprazole (PREVACID) 30 MG capsule Take 1 capsule (30 mg total) by mouth daily. 05/11/16   Geradine Girt, DO  levothyroxine (SYNTHROID, LEVOTHROID) 175 MCG tablet Take 175 mcg by mouth daily. 04/06/17   [provider]  LORazepam (ATIVAN) 1 MG tablet Take 1 tablet (1 mg total) by mouth 3 times/day as needed-between meals & bedtime for anxiety. 06/28/15   Elwin Mocha, MD  metFORMIN (GLUCOPHAGE-XR) 500 MG 24 hr tablet Take 500 mg by mouth daily with breakfast.    [provider]  metoprolol succinate (TOPROL XL) 25 MG 24 hr tablet Take 1 tablet (25 mg total) by mouth daily. 04/20/17   Imogene Burn, PA-C  nicotine (NICODERM CQ) 21 mg/24hr patch Place 1 patch (21 mg total) onto the skin daily. 04/20/17   Imogene Burn, PA-C  nortriptyline (PAMELOR) 75 MG capsule Take 75 mg by mouth at bedtime. 01/24/14   [provider]  potassium chloride (KLOR-CON 10) 10 MEQ tablet Take 1 tablet (10 mEq total) by mouth daily. 03/19/16   Elayne Snare, MD  pravastatin (PRAVACHOL) 20 MG tablet Take 20 mg by mouth daily.    [provider]  QUEtiapine (SEROQUEL) 100 MG tablet Take 100 mg by mouth at bedtime. 09/25/16   [provider]  tolterodine (DETROL LA) 4 MG 24 hr capsule Take 4 mg by mouth daily.     [provider]  Vitamin D, Ergocalciferol, (DRISDOL) 50000 units CAPS capsule Take 50,000 Units by mouth once a week. 09/18/16   [provider]    Family History Family History  Problem Relation Age of Onset  . Heart disease Mother   . Lung cancer Mother   . Cancer Mother        Lung  . Heart disease Father   . Lung cancer Father   . Cancer Father        Lung  . Heart disease Sister        CABG- Open Heart    Social History Social History   Tobacco Use    . Smoking status: Current Every Day Smoker    Packs/day: 1.50    Years: 50.00  Pack years: 75.00    Types: Cigarettes    Last attempt to quit: 06/24/2014    Years since quitting: 2.8  . Smokeless tobacco: Never Used  Substance Use Topics  . Alcohol use: No    Alcohol/week: 0.0 oz  . Drug use: No     Allergies   Patient has no known allergies.   Review of Systems Review of Systems  Neurological: Positive for weakness.  All other systems reviewed and are negative.    Physical Exam Updated Vital Signs BP 111/68   Pulse 61   Temp 98.1 F (36.7 C)   Resp 20   Ht 5\' 1"  (1.549 m)   Wt 70.8 kg (156 lb)   SpO2 100%   BMI 29.48 kg/m   Physical Exam  Constitutional: She is oriented to person, place, and time. She appears well-developed and well-nourished.  HENT:  Head: Normocephalic and atraumatic.  Eyes: EOM are normal. Pupils are equal, round, and reactive to light.  Cardiovascular: Normal rate and regular rhythm.  No murmur heard. Pulmonary/Chest: Effort normal and breath sounds normal. No respiratory distress.  Abdominal: Soft. There is no tenderness. There is no rebound and no guarding.  Musculoskeletal: She exhibits no edema or tenderness.  Neurological: She is alert and oriented to person, place, and time.  Poor effort on cranial nerve testing.  There is no clear facial droop.  2 out of 5 strength in the right upper extremity and right lower extremity.  Altered sensation to light touch in the right lower extremity.  5 out of 5 strength in the left upper and left lower extremities.  Skin: Skin is warm and dry.  Psychiatric: She has a normal mood and affect. Her behavior is normal.  Nursing note and vitals reviewed.    ED Treatments / Results  Labs (all labs ordered are listed, but only abnormal results are displayed) Labs Reviewed  CBC - Abnormal; Notable for the following components:      Result Value   RBC 3.39 (*)    Hemoglobin 10.0 (*)    HCT 30.2 (*)     All other components within normal limits  COMPREHENSIVE METABOLIC PANEL - Abnormal; Notable for the following components:   CO2 19 (*)    Glucose, Bld 160 (*)    BUN 26 (*)    Creatinine, Ser 1.85 (*)    Calcium 8.6 (*)    ALT 12 (*)    GFR calc non Af Amer 27 (*)    GFR calc Af Amer 31 (*)    All other components within normal limits  CBG MONITORING, ED - Abnormal; Notable for the following components:   Glucose-Capillary 176 (*)    All other components within normal limits  I-STAT CHEM 8, ED - Abnormal; Notable for the following components:   BUN 29 (*)    Creatinine, Ser 1.80 (*)    Glucose, Bld 154 (*)    Calcium, Ion 1.14 (*)    TCO2 21 (*)    Hemoglobin 9.9 (*)    HCT 29.0 (*)    All other components within normal limits  I-STAT CG4 LACTIC ACID, ED - Abnormal; Notable for the following components:   Lactic Acid, Venous 1.91 (*)    All other components within normal limits  ETHANOL  PROTIME-INR  APTT  DIFFERENTIAL  RAPID URINE DRUG SCREEN, HOSP PERFORMED  URINALYSIS, ROUTINE W REFLEX MICROSCOPIC  I-STAT TROPONIN, ED  I-STAT CG4 LACTIC ACID, ED    EKG  EKG Interpretation  Date/Time:  Thursday April 22 2017 20:55:06 EST Ventricular Rate:  63 PR Interval:    QRS Duration: 105 QT Interval:  429 QTC Calculation: 440 R Axis:   41 Text Interpretation:  Sinus rhythm Atrial premature complexes Low voltage, precordial leads Borderline T abnormalities, anterior leads Confirmed by Quintella Reichert [phone]) on 04/22/2017 9:00:35 PM       Radiology Ct Head Wo Contrast  Result Date: 04/22/2017 CLINICAL DATA:  Generalized weakness with headache since this afternoon. EXAM: CT HEAD WITHOUT CONTRAST TECHNIQUE: Contiguous axial images were obtained from the base of the skull through the vertex without intravenous contrast. COMPARISON:  10/30/2016 FINDINGS: Brain: No evidence of acute infarction, hemorrhage, hydrocephalus, extra-axial collection or mass lesion/mass effect.  Minimal chronic ischemic microvascular disease. Vascular: No hyperdense vessel or unexpected calcification. Skull: Normal. Negative for fracture or focal lesion. Sinuses/Orbits: Orbits are normal. 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 Sinuses are otherwise clear. Mastoid air cells are clear. Other: None. IMPRESSION: No acute findings. Mild chronic ischemic microvascular disease. Electronically Signed   By: Marin Olp M.D.   On: 04/22/2017 21:53   Mr Brain Wo Contrast  Result Date: 04/23/2017 CLINICAL DATA:  72 y/o F; generalized weakness, headache, shortness of breath. History of stroke and heart attack. EXAM: MRI HEAD WITHOUT CONTRAST TECHNIQUE: Multiplanar, multiecho pulse sequences of the brain and surrounding structures were obtained without intravenous contrast. COMPARISON:  04/22/2017 CT of the head.  09/01/2016 MRI of the head. FINDINGS: Brain: No acute infarction, hemorrhage, hydrocephalus, extra-axial collection or mass lesion. Severalnonspecific foci of T2 FLAIR hyperintense signal abnormality in subcortical and periventricular white matter are compatible withmild to moderatechronic microvascular ischemic changes for age. Mild to moderatebrain parenchymal volume loss. Vascular: Normal flow voids. Skull and upper cervical spine: Normal marrow signal. Sinuses/Orbits: Right-sided ethmoidectomy and maxillary antrostomy. Mild mucosal thickening of the ethmoid cavity and right maxillary sinus. Bilateral intra-ocular lens replacement. Otherwise negative. Other: None. IMPRESSION: 1. No acute intracranial abnormality. 2. Stable mild-to-moderate chronic microvascular ischemic changes and parenchymal volume loss of the brain. Electronically Signed   By: Kristine Garbe M.D.   On: 04/23/2017 00:18    Procedures Procedures (including critical care time)  Medications Ordered in ED Medications  HYDROcodone-acetaminophen (NORCO/VICODIN)  5-325 MG per tablet 1 tablet (not administered)  diphenhydrAMINE (BENADRYL) capsule 25 mg (not administered)  sodium chloride 0.9 % bolus 500 mL (0 mLs Intravenous Stopped 04/22/17 2258)  sodium chloride 0.9 % bolus 500 mL (500 mLs Intravenous New Bag/Given 04/23/17 0003)     Initial Impression / Assessment and Plan / ED Course  I have reviewed the triage vital signs and the nursing notes.  Pertinent labs & imaging results that were available during my care of the patient were reviewed by me and considered in my medical decision making (see chart for details).     Patient here for evaluation of headache, weakness.  She is hypotensive on ED arrival and blood pressure did improve prior to IV fluid administration.  She is nontoxic appearing on examination.  On repeat assessment she is moving her right upper extremity and right lower extremity freely, unclear if her weakness was secondary to hypotension or effort.  MRI is negative for acute stroke.  Given hypotension, weakness, [place] I medicine consulted for admission for further evaluation.  There is no current evidence of acute infectious process at this time, no findings concerning for sepsis.  Final Clinical Impressions(s) / ED  Diagnoses   Final diagnoses:  Weakness  Dehydration    ED Discharge Orders    None       Quintella Reichert, MD 04/23/17 [phone]

## 2017-04-22 NOTE — ED Notes (Signed)
On way to CT 

## 2017-04-22 NOTE — ED Triage Notes (Signed)
Per EMS, patient has had generalized weakness, headache, SOB since about 4pm.  She has chronic leg pain but states she has had trouble walking today.  NIH negative.  Hx of stroke and heart attack.  324mg  aspirin given en route.  89/52. 60-64 Sinus arrhythmia, 99% RA.

## 2017-04-23 ENCOUNTER — Observation Stay (HOSPITAL_COMMUNITY): Payer: Medicare Other

## 2017-04-23 ENCOUNTER — Encounter (HOSPITAL_COMMUNITY): Payer: Self-pay | Admitting: Internal Medicine

## 2017-04-23 DIAGNOSIS — E039 Hypothyroidism, unspecified: Secondary | ICD-10-CM | POA: Diagnosis not present

## 2017-04-23 DIAGNOSIS — E119 Type 2 diabetes mellitus without complications: Secondary | ICD-10-CM | POA: Diagnosis not present

## 2017-04-23 DIAGNOSIS — I959 Hypotension, unspecified: Secondary | ICD-10-CM | POA: Diagnosis not present

## 2017-04-23 DIAGNOSIS — Z794 Long term (current) use of insulin: Secondary | ICD-10-CM | POA: Diagnosis not present

## 2017-04-23 DIAGNOSIS — J438 Other emphysema: Secondary | ICD-10-CM | POA: Diagnosis not present

## 2017-04-23 DIAGNOSIS — R42 Dizziness and giddiness: Secondary | ICD-10-CM

## 2017-04-23 DIAGNOSIS — I739 Peripheral vascular disease, unspecified: Secondary | ICD-10-CM | POA: Diagnosis not present

## 2017-04-23 LAB — COMPREHENSIVE METABOLIC PANEL
ALT: 12 U/L — ABNORMAL LOW (ref 14–54)
AST: 15 U/L (ref 15–41)
Albumin: 3.4 g/dL — ABNORMAL LOW (ref 3.5–5.0)
Alkaline Phosphatase: 88 U/L (ref 38–126)
Anion gap: 13 (ref 5–15)
BUN: 26 mg/dL — ABNORMAL HIGH (ref 6–20)
CHLORIDE: 106 mmol/L (ref 101–111)
CO2: 18 mmol/L — AB (ref 22–32)
CREATININE: 1.68 mg/dL — AB (ref 0.44–1.00)
Calcium: 8.9 mg/dL (ref 8.9–10.3)
GFR calc non Af Amer: 30 mL/min — ABNORMAL LOW (ref >60)
GFR, EST AFRICAN AMERICAN: 35 mL/min — AB (ref >60)
Glucose, Bld: 191 mg/dL — ABNORMAL HIGH (ref 65–99)
POTASSIUM: 4 mmol/L (ref 3.5–5.1)
SODIUM: 137 mmol/L (ref 135–145)
Total Bilirubin: 0.3 mg/dL (ref 0.3–1.2)
Total Protein: 6.6 g/dL (ref 6.5–8.1)

## 2017-04-23 LAB — URINALYSIS, ROUTINE W REFLEX MICROSCOPIC
Bilirubin Urine: NEGATIVE
GLUCOSE, UA: 50 mg/dL — AB
Hgb urine dipstick: NEGATIVE
Ketones, ur: NEGATIVE mg/dL
LEUKOCYTES UA: NEGATIVE
NITRITE: NEGATIVE
PROTEIN: NEGATIVE mg/dL
Specific Gravity, Urine: 1.008 (ref 1.005–1.030)
pH: 5 (ref 5.0–8.0)

## 2017-04-23 LAB — CBC WITH DIFFERENTIAL/PLATELET
Basophils Absolute: 0.1 10*3/uL (ref 0.0–0.1)
Basophils Relative: 1 %
EOS ABS: 0.2 10*3/uL (ref 0.0–0.7)
Eosinophils Relative: 2 %
HEMATOCRIT: 31.5 % — AB (ref 36.0–46.0)
HEMOGLOBIN: 10.5 g/dL — AB (ref 12.0–15.0)
LYMPHS ABS: 2.9 10*3/uL (ref 0.7–4.0)
LYMPHS PCT: 32 %
MCH: 29.7 pg (ref 26.0–34.0)
MCHC: 33.3 g/dL (ref 30.0–36.0)
MCV: 89.2 fL (ref 78.0–100.0)
MONOS PCT: 5 %
Monocytes Absolute: 0.4 10*3/uL (ref 0.1–1.0)
NEUTROS PCT: 60 %
Neutro Abs: 5.5 10*3/uL (ref 1.7–7.7)
Platelets: 296 10*3/uL (ref 150–400)
RBC: 3.53 MIL/uL — AB (ref 3.87–5.11)
RDW: 14.6 % (ref 11.5–15.5)
WBC: 9 10*3/uL (ref 4.0–10.5)

## 2017-04-23 LAB — GLUCOSE, CAPILLARY
GLUCOSE-CAPILLARY: 243 mg/dL — AB (ref 65–99)
Glucose-Capillary: 207 mg/dL — ABNORMAL HIGH (ref 65–99)
Glucose-Capillary: 251 mg/dL — ABNORMAL HIGH (ref 65–99)
Glucose-Capillary: 259 mg/dL — ABNORMAL HIGH (ref 65–99)

## 2017-04-23 LAB — RAPID URINE DRUG SCREEN, HOSP PERFORMED
Amphetamines: NOT DETECTED
BARBITURATES: NOT DETECTED
Benzodiazepines: NOT DETECTED
COCAINE: NOT DETECTED
OPIATES: POSITIVE — AB
Tetrahydrocannabinol: NOT DETECTED

## 2017-04-23 LAB — TROPONIN I: Troponin I: <0.03 ng/mL (ref <0.03)

## 2017-04-23 LAB — I-STAT CG4 LACTIC ACID, ED: Lactic Acid, Venous: 1.02 mmol/L (ref 0.5–1.9)

## 2017-04-23 LAB — TSH: TSH: 9.596 u[IU]/mL — ABNORMAL HIGH (ref 0.350–4.500)

## 2017-04-23 LAB — D-DIMER, QUANTITATIVE: D-Dimer, Quant: 1.19 ug/mL-FEU — ABNORMAL HIGH (ref 0.00–0.50)

## 2017-04-23 LAB — CK: CK TOTAL: 30 U/L — AB (ref 38–234)

## 2017-04-23 MED ORDER — ONDANSETRON HCL 4 MG PO TABS: 4.0000 mg | ORAL_TABLET | Freq: Four times a day (QID) | ORAL | Status: DC | PRN

## 2017-04-23 MED ORDER — FERROUS SULFATE 325 (65 FE) MG PO TABS
325.0000 mg | ORAL_TABLET | Freq: Every day | ORAL | Status: DC
  Administered 2017-04-23 – 2017-04-24 (×2): 325 mg via ORAL
  Filled 2017-04-23 (×2): qty 1

## 2017-04-23 MED ORDER — QUETIAPINE FUMARATE 50 MG PO TABS
100.0000 mg | ORAL_TABLET | Freq: Every day | ORAL | Status: DC
  Administered 2017-04-23: 100 mg via ORAL
  Filled 2017-04-23: qty 2

## 2017-04-23 MED ORDER — FESOTERODINE FUMARATE ER 4 MG PO TB24
4.0000 mg | ORAL_TABLET | Freq: Every day | ORAL | Status: DC
  Administered 2017-04-23 – 2017-04-24 (×2): 4 mg via ORAL
  Filled 2017-04-23 (×2): qty 1

## 2017-04-23 MED ORDER — LEVOTHYROXINE SODIUM 75 MCG PO TABS
175.0000 ug | ORAL_TABLET | Freq: Every day | ORAL | Status: DC
  Administered 2017-04-23 – 2017-04-24 (×2): 175 ug via ORAL
  Filled 2017-04-23 (×2): qty 1

## 2017-04-23 MED ORDER — DIPHENHYDRAMINE HCL 25 MG PO CAPS
25.0000 mg | ORAL_CAPSULE | Freq: Once | ORAL | Status: AC
  Administered 2017-04-23: 25 mg via ORAL
  Filled 2017-04-23: qty 1

## 2017-04-23 MED ORDER — HYDROCODONE-ACETAMINOPHEN 10-325 MG PO TABS
1.0000 | ORAL_TABLET | Freq: Three times a day (TID) | ORAL | Status: DC | PRN
  Administered 2017-04-23 – 2017-04-24 (×5): 1 via ORAL
  Filled 2017-04-23 (×5): qty 1

## 2017-04-23 MED ORDER — HYDRALAZINE HCL 20 MG/ML IJ SOLN: 10.0000 mg | INTRAMUSCULAR | Status: DC | PRN

## 2017-04-23 MED ORDER — TECHNETIUM TO 99M ALBUMIN AGGREGATED
4.0000 | Freq: Once | INTRAVENOUS | Status: AC | PRN
  Administered 2017-04-23: 4 via INTRAVENOUS

## 2017-04-23 MED ORDER — INSULIN GLARGINE 100 UNIT/ML ~~LOC~~ SOLN
60.0000 [IU] | Freq: Every day | SUBCUTANEOUS | Status: DC
  Administered 2017-04-23 – 2017-04-24 (×2): 60 [IU] via SUBCUTANEOUS
  Filled 2017-04-23 (×2): qty 0.6

## 2017-04-23 MED ORDER — ACETAMINOPHEN 325 MG PO TABS: 650.0000 mg | ORAL_TABLET | Freq: Four times a day (QID) | ORAL | Status: DC | PRN

## 2017-04-23 MED ORDER — ENSURE ENLIVE PO LIQD
237.0000 mL | Freq: Two times a day (BID) | ORAL | Status: DC
  Administered 2017-04-23 – 2017-04-24 (×3): 237 mL via ORAL

## 2017-04-23 MED ORDER — TECHNETIUM TC 99M DIETHYLENETRIAME-PENTAACETIC ACID
30.0000 | Freq: Once | INTRAVENOUS | Status: AC | PRN
  Administered 2017-04-23: 30 via INTRAVENOUS

## 2017-04-23 MED ORDER — HEPARIN SODIUM (PORCINE) 5000 UNIT/ML IJ SOLN
5000.0000 [IU] | Freq: Three times a day (TID) | INTRAMUSCULAR | Status: DC
  Administered 2017-04-23 – 2017-04-24 (×3): 5000 [IU] via SUBCUTANEOUS
  Filled 2017-04-23 (×3): qty 1

## 2017-04-23 MED ORDER — LORAZEPAM 1 MG PO TABS: 1.0000 mg | ORAL_TABLET | Freq: Two times a day (BID) | ORAL | Status: DC | PRN

## 2017-04-23 MED ORDER — ACETAMINOPHEN 650 MG RE SUPP: 650.0000 mg | Freq: Four times a day (QID) | RECTAL | Status: DC | PRN

## 2017-04-23 MED ORDER — NORTRIPTYLINE HCL 25 MG PO CAPS
75.0000 mg | ORAL_CAPSULE | Freq: Every day | ORAL | Status: DC
  Administered 2017-04-23: 75 mg via ORAL
  Filled 2017-04-23: qty 3

## 2017-04-23 MED ORDER — INSULIN ASPART 100 UNIT/ML ~~LOC~~ SOLN
0.0000 [IU] | Freq: Three times a day (TID) | SUBCUTANEOUS | Status: DC
  Administered 2017-04-23 (×2): 3 [IU] via SUBCUTANEOUS
  Administered 2017-04-23: 5 [IU] via SUBCUTANEOUS
  Administered 2017-04-24: 2 [IU] via SUBCUTANEOUS

## 2017-04-23 MED ORDER — LORATADINE 10 MG PO TABS
10.0000 mg | ORAL_TABLET | Freq: Every day | ORAL | Status: DC | PRN
  Administered 2017-04-23: 10 mg via ORAL
  Filled 2017-04-23: qty 1

## 2017-04-23 MED ORDER — CLOPIDOGREL BISULFATE 75 MG PO TABS
75.0000 mg | ORAL_TABLET | Freq: Every day | ORAL | Status: DC
  Administered 2017-04-23 – 2017-04-24 (×2): 75 mg via ORAL
  Filled 2017-04-23 (×2): qty 1

## 2017-04-23 MED ORDER — PRAVASTATIN SODIUM 20 MG PO TABS
20.0000 mg | ORAL_TABLET | Freq: Every day | ORAL | Status: DC
  Administered 2017-04-23 – 2017-04-24 (×2): 20 mg via ORAL
  Filled 2017-04-23 (×2): qty 1

## 2017-04-23 MED ORDER — PANTOPRAZOLE SODIUM 40 MG PO TBEC
40.0000 mg | DELAYED_RELEASE_TABLET | Freq: Every day | ORAL | Status: DC
  Administered 2017-04-23 – 2017-04-24 (×2): 40 mg via ORAL
  Filled 2017-04-23 (×2): qty 1

## 2017-04-23 MED ORDER — HYDROCODONE-ACETAMINOPHEN 5-325 MG PO TABS
1.0000 | ORAL_TABLET | Freq: Once | ORAL | Status: AC
  Administered 2017-04-23: 1 via ORAL
  Filled 2017-04-23: qty 1

## 2017-04-23 MED ORDER — DULOXETINE HCL 60 MG PO CPEP
60.0000 mg | ORAL_CAPSULE | Freq: Every day | ORAL | Status: DC
  Administered 2017-04-23 – 2017-04-24 (×2): 60 mg via ORAL
  Filled 2017-04-23 (×2): qty 1

## 2017-04-23 MED ORDER — ONDANSETRON HCL 4 MG/2ML IJ SOLN
4.0000 mg | Freq: Four times a day (QID) | INTRAMUSCULAR | Status: DC | PRN
Start: 2017-04-23 — End: 2017-04-24

## 2017-04-23 NOTE — Progress Notes (Signed)
Nutrition Brief Note  Patient identified on the Malnutrition Screening Tool (MST) Report.  Pt has had a 4% weight loss since September 2018. Not significant for time frame.  Wt Readings from Last 15 Encounters:  04/23/17 156 lb 12 oz (71.1 kg)  04/20/17 156 lb (70.8 kg)  03/30/17 158 lb (71.7 kg)  10/31/16 162 lb 7.7 oz (73.7 kg)  10/19/16 171 lb 8 oz (77.8 kg)  10/15/16 174 lb 1.6 oz (79 kg)  10/04/16 149 lb (67.6 kg)  05/11/16 169 lb 12.8 oz (77 kg)  03/27/16 172 lb (78 kg)  02/26/16 167 lb (75.8 kg)  12/18/15 167 lb (75.8 kg)  11/28/15 169 lb 3.2 oz (76.7 kg)  10/24/15 169 lb (76.7 kg)  09/03/15 169 lb 15.6 oz (77.1 kg)  07/25/15 166 lb 12.8 oz (75.7 kg)   Body mass index is 29.62 kg/m. Patient meets criteria for Overweight based on current BMI.   Current diet order is HH/Carbohydrate Modified. Labs and medications reviewed.   No nutrition interventions warranted at this time. If nutrition issues arise, please consult RD.   Arthur Holms, RD, LDN Pager #: [phone] After-Hours Pager #: [phone]

## 2017-04-23 NOTE — Evaluation (Signed)
Physical Therapy Evaluation Patient Details Name: April Branch MRN: [id number] DOB: [birth year] Today's Date: 04/23/2017   History of Present Illness  April Branch is a 72 y.o. female with history of peripheral vascular disease status post femoropopliteal bypass, chronic kidney disease stage III, hypertension, hyperlipidemia, diabetes mellitus, anemia, hypothyroidism who had recently visited cardiology office with complaints of chest pain and palpitation and has outpatient stress test scheduled on March 7 was brought to the ER after patient was complaining of weakness  Clinical Impression  Patient presents with decreased independence with mobility and was receiving HHPT prior to admission.  She has positive orthostatics, but was able to ambulate in the hall with minimal symptoms.  Reports mostly limited by weakness.  Feel she will benefit from skilled PT in the acute setting and to resume HHPT at d/c.   Orthostatic VS for the past 24 hrs (Last 3 readings):  BP- Lying Pulse- Lying BP- Sitting Pulse- Sitting BP- Standing at 0 minutes Pulse- Standing at 0 minutes BP- Standing at 3 minutes Pulse- Standing at 3 minutes  04/23/17 1148 118/60 70 110/62 79 (!) 81/57 74 (!) 88/54 78      Follow Up Recommendations Home health PT;Supervision/Assistance - 24 hour    Equipment Recommendations  None recommended by PT    Recommendations for Other Services       Precautions / Restrictions Precautions Precautions: Fall Precaution Comments: watch BP, last fall in August      Mobility  Bed Mobility Overal bed mobility: Modified Independent                Transfers Overall transfer level: Needs assistance   Transfers: Sit to/from Stand Sit to Stand: Min guard         General transfer comment: for balance  Ambulation/Gait Ambulation/Gait assistance: Min assist Ambulation Distance (Feet): 160 Feet Assistive device: 1 person hand held assist Gait Pattern/deviations:  Step-through pattern;Decreased stride length     General Gait Details: reliant on UE support for ambulation  Stairs            Wheelchair Mobility    Modified Rankin (Stroke Patients Only)       Balance Overall balance assessment: Needs assistance   Sitting balance-Leahy Scale: Good     Standing balance support: Single extremity supported;Bilateral upper extremity supported Standing balance-Leahy Scale: Poor Standing balance comment: reaches for UE support in standing                             Pertinent Vitals/Pain Pain Assessment: Faces Faces Pain Scale: Hurts little more Pain Location: back Pain Descriptors / Indicators: Sore Pain Intervention(s): Monitored during session;Repositioned    Home Living Family/patient expects to be discharged to:: Private residence Living Arrangements: Spouse/significant other;Children Available Help at Discharge: Family Type of Home: House Home Access: Stairs to enter Entrance Stairs-Rails: Right;Left;Can reach both Entrance Stairs-Number of Steps: 3 Home Layout: One level;Laundry or work area in [place]: Kasandra Knudsen - single point;Walker - 4 wheels Additional Comments: has 5 dogs    Prior Function Level of Independence: Needs assistance   Gait / Transfers Assistance Needed: uses cane or nothing in the house  ADL's / Homemaking Assistance Needed: does wash dishes and sweeps or vacuums, does not go to basement to do laundry  Comments: was getting HHPT prior to admission     Hand Dominance        Extremity/Trunk Assessment   Upper Extremity  Assessment Upper Extremity Assessment: Overall WFL for tasks assessed    Lower Extremity Assessment Lower Extremity Assessment: Generalized weakness       Communication   Communication: No difficulties  Cognition Arousal/Alertness: Awake/alert Behavior During Therapy: WFL for tasks assessed/performed Overall Cognitive Status: Within Functional Limits  for tasks assessed                                        General Comments General comments (skin integrity, edema, etc.): see orthostatics; relates her sister passed away in 02-28-23 and she is still grieving and in shock    Exercises     Assessment/Plan    PT Assessment Patient needs continued PT services  PT Problem List Decreased strength;Decreased mobility;Decreased activity tolerance;Decreased balance;Decreased knowledge of use of DME       PT Treatment Interventions DME instruction;Functional mobility training;Balance training;Patient/family education;Gait training;Therapeutic activities;Therapeutic exercise;Stair training    PT Goals (Current goals can be found in the Care Plan section)  Acute Rehab PT Goals Patient Stated Goal: To return to independent PT Goal Formulation: With patient Time For Goal Achievement: 05/07/17 Potential to Achieve Goals: Good    Frequency Min 3X/week   Barriers to discharge        Co-evaluation               AM-PAC PT "6 Clicks" Daily Activity  Outcome Measure Difficulty turning over in bed (including adjusting bedclothes, sheets and blankets)?: None Difficulty moving from lying on back to sitting on the side of the bed? : A Little Difficulty sitting down on and standing up from a chair with arms (e.g., wheelchair, bedside commode, etc,.)?: Unable Help needed moving to and from a bed to chair (including a wheelchair)?: A Little Help needed walking in hospital room?: A Little Help needed climbing 3-5 steps with a railing? : A Little 6 Click Score: 17    End of Session Equipment Utilized During Treatment: Gait belt Activity Tolerance: Patient tolerated treatment well Patient left: with call bell/phone within reach;in chair   PT Visit Diagnosis: Other abnormalities of gait and mobility (R26.89);Muscle weakness (generalized) (M62.81)    Time: 2876-8115 PT Time Calculation (min) (ACUTE ONLY): 26  min   Charges:   PT Evaluation $PT Eval Moderate Complexity: 1 Mod PT Treatments $Gait Training: 8-22 mins   PT G CodesMagda Branch, [place] [phone] 04/23/2017   April Branch 04/23/2017, 11:56 AM

## 2017-04-23 NOTE — H&P (Addendum)
History and Physical    April Branch WUJ:[id number] DOB: 1945/12/03 DOA: 04/22/2017  PCP: [name], MD  Patient coming from: Home.  Chief Complaint: Weakness.  HPI: April Branch is a 72 y.o. female with history of peripheral vascular disease status post femoropopliteal bypass, chronic kidney disease stage III, hypertension, hyperlipidemia, diabetes mellitus, anemia, hypothyroidism who had recently visited cardiology office with complaints of chest pain and palpitation and has outpatient stress test scheduled on March 7 was brought to the ER after patient was complaining of weakness.  Patient states she had gone to a nap and woke up around 4 PM last evening following which patient was feeling very weak to walk.  Patient's friend tried to help her but was unable to make a walker and EMS was called.  The patient at this time did not have any active chest pain though had some chest pain yesterday.  Denies any nausea vomiting diarrhea.  During the recent cardiology visit patient was restarted on Toprol-XL which patient has been taking for last 4 days.  ED Course: In the ER EKG shows sinus rhythm with possible blocked APCs (I discussed the EKG with cardiology.).  CT head followed by MRI of the brain did not show anything acute.  Patient was initially hypotensive which improved with fluid.  So was also lactate.  Patient still feels dizzy and weak.  Patient is being admitted for further observation.  Review of Systems: As per HPI, rest all negative.   Past Medical History:  Diagnosis Date  . Anemia   . Anginal pain ([place])    1 MONTH AGO   . Anxiety   . Arthritis   . CAD (coronary artery disease)   . CAD (coronary artery disease) of bypass graft   . 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 Colon polyps 09/11/2010   Tubular adenoma  . COPD 12/14/2008   PATIENT DENIES    . Depression   . Diabetes mellitus age 83   insulin dependent  .  Diabetic coma ([place]) Feb. 2014  . Fall at home Jan. 2, 2016  Jan. 13, 2016   Left knee  . Fibromyalgia   . GERD 07/20/2006  . GERD (gastroesophageal reflux disease)   . Headache(784.0)   . History of transient ischemic attack (TIA)   . HPV (human papilloma virus) infection   . Hyperlipidemia   . Hypertension   . Hypothyroidism   . Insulin dependent type 2 diabetes mellitus, controlled ([place])   . Lumbar disc disease   . Myocardial infarction ([place])    AGE 49    . Neuromuscular disorder ([place])    PERIPHERAL NEUROPATHY  . Peripheral vascular disease ([place])   . Personal history of malignant neoplasm of kidney(V10.52) 12/14/2008   Laparoscopic biopsy and cryoablation 7/08 Dr. Vernie Shanks   . Renal disorder    chronic kidney disease   . Stroke [facility])    3 MINI STROKES  RT SIDED WEAKNESS  . Vertigo     Past Surgical History:  Procedure Laterality Date  . ABDOMINAL AORTAGRAM N/A 08/21/2011   Procedure: ABDOMINAL Maxcine Ham;  Surgeon: Elam Dutch, MD;  Location: [facility] CATH LAB;  Service: Cardiovascular;  Laterality: N/A;  . ABDOMINAL AORTAGRAM N/A 03/16/2014   Procedure: ABDOMINAL Maxcine Ham;  Surgeon: Elam Dutch, MD;  Location: [facility] CATH LAB;  Service: Cardiovascular;  Laterality: N/A;  . ABDOMINAL HYSTERECTOMY    . APPENDECTOMY    .  BLADDER SUSPENSION    . CARDIAC CATHETERIZATION    . CHOLECYSTECTOMY OPEN    . ESOPHAGOGASTRODUODENOSCOPY (EGD) WITH PROPOFOL N/A 05/16/2014   Procedure: ESOPHAGOGASTRODUODENOSCOPY (EGD) WITH PROPOFOL with Balloon dilation;  Surgeon: Milus Banister, MD;  Location: [place];  Service: Endoscopy;  Laterality: N/A;  . ESOPHAGOGASTRODUODENOSCOPY (EGD) WITH PROPOFOL N/A 09/02/2015   Procedure: ESOPHAGOGASTRODUODENOSCOPY (EGD) WITH PROPOFOL;  Surgeon: Doran Stabler, MD;  Location: [place];  Service: Gastroenterology;  Laterality: N/A;  . FEMORAL-POPLITEAL BYPASS GRAFT  10/12/2011   Procedure: BYPASS GRAFT FEMORAL-POPLITEAL ARTERY;  Surgeon: Elam Dutch, MD;  Location: [facility] OR;  Service: Vascular;  Laterality: Left;  Left Femoral-Popliteal Bypass Graft using 46mm x 80cm Propaten Graft with intraop arteriogram times one.  . FEMORAL-POPLITEAL BYPASS GRAFT Left 04/17/2014   Procedure: REDO LEFT FEMORAL-POPLITEAL ARTERY BYPASS USING GORE PROPATEN 61mmx80cm GRAFT;  Surgeon: Elam Dutch, MD;  Location: [place];  Service: Vascular;  Laterality: Left;  . FOOT SURGERY Left    "bone spurs"  . INTRAOPERATIVE ARTERIOGRAM  10/12/2011   Procedure: INTRA OPERATIVE ARTERIOGRAM;  Surgeon: Elam Dutch, MD;  Location: [place];  Service: Vascular;  Laterality: Left;  . KNEE ARTHROSCOPY Bilateral    "cartilage"  . Laparascopic cryoablation of left kidney  08/2006   Dr. Gladis Riffle for renal cell cancer  . LEFT HEART CATHETERIZATION WITH CORONARY ANGIOGRAM N/A 05/11/2011   Procedure: LEFT HEART CATHETERIZATION WITH CORONARY ANGIOGRAM;  Surgeon: Jolaine Artist, MD;  Location[facility] CATH LAB;  Service: Cardiovascular;  Laterality: N/A;  . LUNG SURGERY Right    lung nodule removed from the right side  . PR VEIN BYPASS GRAFT,AORTO-FEM-POP  05/27/2010  . SHOULDER ARTHROSCOPY Bilateral    'Spurs"  . TUBAL LIGATION       reports that she has been smoking cigarettes.  She has a 75.00 pack-year smoking history. she has never used smokeless tobacco. She reports that she does not drink alcohol or use drugs.  No Known Allergies  Family History  Problem Relation Age of Onset  . Heart disease Mother   . Lung cancer Mother   . Cancer Mother        Lung  . Heart disease Father   . Lung cancer Father   . Cancer Father        Lung  . Heart disease Sister        CABG- Open Heart    Prior to Admission medications   Medication Sig Start Date End Date Taking? Authorizing Provider  ACCU-CHEK FASTCLIX LANCETS MISC Use 4 per day to check blood sugar dx code E11.9 11/29/14  Yes Elayne Snare, MD  amLODipine (NORVASC) 10 MG tablet Take 10 mg by mouth daily. 04/06/17  Yes  [provider]  clindamycin (CLEOCIN) 150 MG capsule Take 1 capsule by mouth 2 (two) times daily. 03/29/17  Yes [provider]  clopidogrel (PLAVIX) 75 MG tablet Take 75 mg by mouth daily.   Yes [provider]  DULoxetine (CYMBALTA) 60 MG capsule TAKE ONE CAPSULE PPO EVERY DAY 03/03/17  Yes Elayne Snare, MD  ferrous sulfate 325 (65 FE) MG tablet Take 325 mg by mouth daily with breakfast.    Yes [provider]  HYDROcodone-acetaminophen (NORCO) 10-325 MG tablet Take 1 tablet by mouth 3 (three) times daily as needed (for pain). 11/01/16  Yes Osei-Bonsu, Iona Beard, MD  ibuprofen (ADVIL,MOTRIN) 200 MG tablet Take 200 mg by mouth every 6 (six) hours as needed for moderate pain.  Yes [provider]  Insulin Glargine (TOUJEO SOLOSTAR) 300 UNIT/ML SOPN Inject 30 Units into the skin daily. Patient taking differently: Inject 60 Units into the skin daily.  10/15/16  Yes Patrecia Pour, MD  insulin lispro (HUMALOG KWIKPEN) 100 UNIT/ML KiwkPen INJECT 5 UNITS 3 TIMES A DAY AS NEEDED FOR BLOOD SUGAR Patient taking differently: Inject 10 Units into the skin 3 (three) times daily as needed (blood sugar).  10/15/16  Yes Patrecia Pour, MD  Insulin Pen Needle (BD PEN NEEDLE NANO U/F) 32G X 4 MM MISC Use to inject insulin 10/30/14  Yes Elayne Snare, MD  lansoprazole (PREVACID) 30 MG capsule Take 1 capsule (30 mg total) by mouth daily. 05/11/16  Yes Vann, Tomi Bamberger, DO  levothyroxine (SYNTHROID, LEVOTHROID) 175 MCG tablet Take 175 mcg by mouth daily. 04/06/17  Yes [provider]  LORazepam (ATIVAN) 1 MG tablet Take 1 tablet (1 mg total) by mouth 3 times/day as needed-between meals & bedtime for anxiety. 06/28/15  Yes Elwin Mocha, MD  metFORMIN (GLUCOPHAGE-XR) 500 MG 24 hr tablet Take 500 mg by mouth 2 (two) times daily.    Yes [provider]  metoprolol succinate (TOPROL XL) 25 MG 24 hr tablet Take 1 tablet (25 mg total) by mouth daily. 04/20/17  Yes Imogene Burn, PA-C  nortriptyline (PAMELOR) 75 MG capsule Take 75 mg by mouth at bedtime. 01/24/14  Yes [provider]  potassium chloride (KLOR-CON 10) 10 MEQ tablet Take 1 tablet (10 mEq total) by mouth daily. 03/19/16  Yes Elayne Snare, MD  pravastatin (PRAVACHOL) 20 MG tablet Take 20 mg by mouth daily.   Yes [provider]  QUEtiapine (SEROQUEL) 100 MG tablet Take 100 mg by mouth at bedtime. 09/25/16  Yes [provider]  tolterodine (DETROL LA) 4 MG 24 hr capsule Take 4 mg by mouth daily.    Yes [provider]  Vitamin D, Ergocalciferol, (DRISDOL) 50000 units CAPS capsule Take 50,000 Units by mouth once a week. 09/18/16  Yes [provider]  nicotine (NICODERM CQ) 21 mg/24hr patch Place 1 patch (21 mg total) onto the skin daily. Patient not taking: Reported on 04/23/2017 04/20/17   Imogene Burn, [place]    Physical Exam: Vitals:   04/22/17 2111 04/22/17 2130 04/22/17 2230 04/23/17 0157  BP:  99/64 111/68 (!) 141/121  Pulse:  61 61 60  Resp:  (!) 23 20 20   Temp: 98.1 F (36.7 C)   97.6 F (36.4 C)  TempSrc:    Oral  SpO2:  96% 100% 97%  Weight:    71.1 kg (156 lb 12 oz)  Height:    5\' 1"  (1.549 m)      Constitutional: Moderately built and nourished. Vitals:   04/22/17 2111 04/22/17 2130 04/22/17 2230 04/23/17 0157  BP:  99/64 111/68 (!) 141/121  Pulse:  61 61 60  Resp:  (!) 23 20 20   Temp: 98.1 F (36.7 C)   97.6 F (36.4 C)  TempSrc:    Oral  SpO2:  96% 100% 97%  Weight:    71.1 kg (156 lb 12 oz)  Height:    5\' 1"  (1.549 m)   Eyes: Anicteric no pallor. ENMT: No discharge from the ears eyes nose or mouth. Neck: No mass felt.  No neck rigidity. Respiratory: No rhonchi or crepitation. Cardiovascular: S1-S2 heard no murmurs appreciated. Abdomen: Soft nontender bowel sounds present. Musculoskeletal: No edema.  No joint effusion. Skin: No rash. Neurologic: Alert  awake oriented to time place and person.  Moves all extremities 5 x  5. Psychiatric: Appears normal.  Normal affect.   Labs on Admission: I have personally reviewed following labs and imaging studies  CBC: Recent Labs  Lab 04/20/17 1047 04/22/17 2106 04/22/17 2121  WBC 7.6 8.8  --   NEUTROABS 5.1 5.2  --   HGB 11.9 10.0* 9.9*  HCT 34.9 30.2* 29.0*  MCV 89 89.1  --   PLT 350 310  --    Basic Metabolic Panel: Recent Labs  Lab 04/20/17 1047 04/22/17 2106 04/22/17 2121  NA 138 135 139  K 4.6 4.2 4.2  CL 100 105 106  CO2 20 19*  --   GLUCOSE 275* 160* 154*  BUN 17 26* 29*  CREATININE 1.45* 1.85* 1.80*  CALCIUM 9.6 8.6*  --    GFR: Estimated Creatinine Clearance: 26.6 mL/min (A) (by C-G formula based on SCr of 1.8 mg/dL (H)). Liver Function Tests: Recent Labs  Lab 04/20/17 1047 04/22/17 2106  AST 8 18  ALT 9 12*  ALKPHOS 120* 92  BILITOT 0.2 0.6  PROT 7.8 6.6  ALBUMIN 4.4 3.5   No results for input(s): LIPASE, AMYLASE in the last 168 hours. No results for input(s): AMMONIA in the last 168 hours. Coagulation Profile: Recent Labs  Lab 04/22/17 2106  INR 0.97   Cardiac Enzymes: No results for input(s): CKTOTAL, CKMB, CKMBINDEX, TROPONINI in the last 168 hours. BNP (last 3 results) No results for input(s): PROBNP in the last 8760 hours. HbA1C: No results for input(s): HGBA1C in the last 72 hours. CBG: Recent Labs  Lab 04/22/17 2053  GLUCAP 176*   Lipid Profile: Recent Labs    04/20/17 1047  CHOL 219*  HDL 48  LDLCALC Comment  TRIG 421*  CHOLHDL 4.6*   Thyroid Function Tests: Recent Labs    04/20/17 1047  TSH 14.740*   Anemia Panel: No results for input(s): VITAMINB12, FOLATE, FERRITIN, TIBC, IRON, RETICCTPCT in the last 72 hours. Urine analysis:    Component Value Date/Time   COLORURINE YELLOW 04/23/2017 0035   APPEARANCEUR HAZY (A) 04/23/2017 0035   LABSPEC 1.008 04/23/2017 0035   PHURINE 5.0 04/23/2017 0035   GLUCOSEU 50 (A) 04/23/2017 0035   GLUCOSEU >=1000 (A) 12/13/2015 1353   HGBUR NEGATIVE  04/23/2017 0035   BILIRUBINUR NEGATIVE 04/23/2017 0035   KETONESUR NEGATIVE 04/23/2017 0035   PROTEINUR NEGATIVE 04/23/2017 0035   UROBILINOGEN 0.2 12/13/2015 1353   NITRITE NEGATIVE 04/23/2017 0035   LEUKOCYTESUR NEGATIVE 04/23/2017 0035   Sepsis Labs: @LABRCNTIP (procalcitonin:4,lacticidven:4) )No results found for this or any previous visit (from the past 240 hour(s)).   Radiological Exams on Admission: Ct Head Wo Contrast  Result Date: 04/22/2017 CLINICAL DATA:  Generalized weakness with headache since this afternoon. EXAM: CT HEAD WITHOUT CONTRAST TECHNIQUE: Contiguous axial images were obtained from the base of the skull through the vertex without intravenous contrast. COMPARISON:  10/30/2016 FINDINGS: Brain: No evidence of acute infarction, hemorrhage, hydrocephalus, extra-axial collection or mass lesion/mass effect. Minimal chronic ischemic microvascular disease. Vascular: No hyperdense vessel or unexpected calcification. Skull: Normal. Negative for fracture or focal lesion. Sinuses/Orbits: Orbits are normal. 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 Sinuses are otherwise clear. Mastoid air cells are clear. Other: None. IMPRESSION: No acute findings. Mild chronic ischemic microvascular disease. Electronically Signed   By: Marin Olp M.D.   On: 04/22/2017 21:53   Mr Brain Wo Contrast  Result Date:  04/23/2017 CLINICAL DATA:  72 y/o F; generalized weakness, headache, shortness of breath. History of stroke and heart attack. EXAM: MRI HEAD WITHOUT CONTRAST TECHNIQUE: Multiplanar, multiecho pulse sequences of the brain and surrounding structures were obtained without intravenous contrast. COMPARISON:  04/22/2017 CT of the head.  09/01/2016 MRI of the head. FINDINGS: Brain: No acute infarction, hemorrhage, hydrocephalus, extra-axial collection or mass lesion. Severalnonspecific foci of T2 FLAIR hyperintense signal abnormality in  subcortical and periventricular white matter are compatible withmild to moderatechronic microvascular ischemic changes for age. Mild to moderatebrain parenchymal volume loss. Vascular: Normal flow voids. Skull and upper cervical spine: Normal marrow signal. Sinuses/Orbits: Right-sided ethmoidectomy and maxillary antrostomy. Mild mucosal thickening of the ethmoid cavity and right maxillary sinus. Bilateral intra-ocular lens replacement. Otherwise negative. Other: None. IMPRESSION: 1. No acute intracranial abnormality. 2. Stable mild-to-moderate chronic microvascular ischemic changes and parenchymal volume loss of the brain. Electronically Signed   By: Kristine Garbe M.D.   On: 04/23/2017 00:18    EKG: Independently reviewed.  Sinus rhythm with APCs.  Assessment/Plan Principal Problem:   Dizziness Active Problems:   Hypothyroidism   Insulin dependent type 2 diabetes mellitus, controlled (H[facility])   Essential hypertension   COPD (chronic obstructive pulmonary disease) ([facility])   CAD (coronary artery disease)   PVD (peripheral vascular disease) ([place])    1. Dizziness and weakness -patient was hypotensive on arrival which could be contributing to patient's weakness.  Presently I am holding off patient's antihypertensives which includes amlodipine and Toprol.  Closely monitor in telemetry. 2. Recent chest pains with palpitation -patient is originally scheduled to follow-up with cardiology for stress test.  Will consult cardiology while inpatient.  Check d-dimer cycle cardiac markers. 3. Hypothyroidism on Synthroid check TSH.  Recent TSH was 14.7. 4. Hypertension presently holding off antihypertensives due to initial hypotension while presentation.  Will keep patient on PRN IV hydralazine for now.  Closely follow blood pressure trends. 5. Chronic kidney disease stage III creatinine appears to be mildly worsened.  Follow metabolic panel. 6. Diabetes mellitus type 2 on insulin patient takes 60 units  of long-acting insulin in the morning which will be continued along with sliding scale coverage. 7. Peripheral vascular disease status post femoropopliteal bypass.  Continue antiplatelet agents and statins. 8. COPD not actively wheezing. 9. Anemia likely from chronic disease -reviewed old labs.  Appears to be at baseline.  On iron supplements.   DVT prophylaxis: Heparin. Code Status: Full code. Family Communication: Discussed with patient. Disposition Plan: Home. Consults called: Cardiology. Admission status: Observation.   Rise Patience MD [name] Pager [phone].  If 7PM-7AM, please contact night-coverage [email or web address] Password [email or web address]  04/23/2017, 3:59 AM

## 2017-04-23 NOTE — Progress Notes (Signed)
History and Physical    April Branch IPJ:[id number] DOB: Jan 16, 1946 DOA: 04/22/2017  PCP: [name], MD  Patient coming from: Home.  Chief Complaint: Weakness.  HPI: April Branch is a 72 y.o. female with history of peripheral vascular disease status post femoropopliteal bypass, chronic kidney disease stage III, hypertension, hyperlipidemia, diabetes mellitus, anemia, hypothyroidism who had recently visited cardiology office with complaints of chest pain and palpitation and has outpatient stress test scheduled on March 7 was brought to the ER after patient was complaining of weakness.  Patient states she had gone to a nap and woke up around 4 PM last evening following which patient was feeling very weak to walk.  Patient's friend tried to help her but was unable to make a walker and EMS was called.  The patient at this time did not have any active chest pain though had some chest pain yesterday.  Denies any nausea vomiting diarrhea.  During the recent cardiology visit patient was restarted on Toprol-XL which patient has been taking for last 4 days.  ED Course: In the ER EKG shows sinus rhythm with possible blocked APCs (I discussed the EKG with cardiology.).  CT head followed by MRI of the brain did not show anything acute.  Patient was initially hypotensive which improved with fluid.  So was also lactate.  Patient still feels dizzy and weak.  Patient is being admitted for further observation.  Review of Systems: As per HPI, rest all negative.   Past Medical History:  Diagnosis Date  . Anemia   . Anginal pain ([place])    1 MONTH AGO   . Anxiety   . Arthritis   . CAD (coronary artery disease)   . CAD (coronary artery disease) of bypass graft   . 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 Colon polyps 09/11/2010   Tubular adenoma  . COPD 12/14/2008   PATIENT DENIES    . Depression   . Diabetes mellitus age 78   insulin dependent  .  Diabetic coma ([place]) Feb. 2014  . Fall at home Jan. 2, 2016  Jan. 13, 2016   Left knee  . Fibromyalgia   . GERD 07/20/2006  . GERD (gastroesophageal reflux disease)   . Headache(784.0)   . History of transient ischemic attack (TIA)   . HPV (human papilloma virus) infection   . Hyperlipidemia   . Hypertension   . Hypothyroidism   . Insulin dependent type 2 diabetes mellitus, controlled ([place])   . Lumbar disc disease   . Myocardial infarction ([place])    AGE 63    . Neuromuscular disorder ([place])    PERIPHERAL NEUROPATHY  . Peripheral vascular disease ([place])   . Personal history of malignant neoplasm of kidney(V10.52) 12/14/2008   Laparoscopic biopsy and cryoablation 7/08 Dr. Vernie Shanks   . Renal disorder    chronic kidney disease   . Stroke [facility])    3 MINI STROKES  RT SIDED WEAKNESS  . Vertigo     Past Surgical History:  Procedure Laterality Date  . ABDOMINAL AORTAGRAM N/A 08/21/2011   Procedure: ABDOMINAL Maxcine Ham;  Surgeon: Elam Dutch, MD;  Location: [facility] CATH LAB;  Service: Cardiovascular;  Laterality: N/A;  . ABDOMINAL AORTAGRAM N/A 03/16/2014   Procedure: ABDOMINAL Maxcine Ham;  Surgeon: Elam Dutch, MD;  Location: [facility] CATH LAB;  Service: Cardiovascular;  Laterality: N/A;  . ABDOMINAL HYSTERECTOMY    . APPENDECTOMY    .  BLADDER SUSPENSION    . CARDIAC CATHETERIZATION    . CHOLECYSTECTOMY OPEN    . ESOPHAGOGASTRODUODENOSCOPY (EGD) WITH PROPOFOL N/A 05/16/2014   Procedure: ESOPHAGOGASTRODUODENOSCOPY (EGD) WITH PROPOFOL with Balloon dilation;  Surgeon: Milus Banister, MD;  Location: [place];  Service: Endoscopy;  Laterality: N/A;  . ESOPHAGOGASTRODUODENOSCOPY (EGD) WITH PROPOFOL N/A 09/02/2015   Procedure: ESOPHAGOGASTRODUODENOSCOPY (EGD) WITH PROPOFOL;  Surgeon: Doran Stabler, MD;  Location: [place];  Service: Gastroenterology;  Laterality: N/A;  . FEMORAL-POPLITEAL BYPASS GRAFT  10/12/2011   Procedure: BYPASS GRAFT FEMORAL-POPLITEAL ARTERY;  Surgeon: Elam Dutch, MD;  Location: [facility] OR;  Service: Vascular;  Laterality: Left;  Left Femoral-Popliteal Bypass Graft using 27mm x 80cm Propaten Graft with intraop arteriogram times one.  . FEMORAL-POPLITEAL BYPASS GRAFT Left 04/17/2014   Procedure: REDO LEFT FEMORAL-POPLITEAL ARTERY BYPASS USING GORE PROPATEN 58mmx80cm GRAFT;  Surgeon: Elam Dutch, MD;  Location: [place];  Service: Vascular;  Laterality: Left;  . FOOT SURGERY Left    "bone spurs"  . INTRAOPERATIVE ARTERIOGRAM  10/12/2011   Procedure: INTRA OPERATIVE ARTERIOGRAM;  Surgeon: Elam Dutch, MD;  Location: [place];  Service: Vascular;  Laterality: Left;  . KNEE ARTHROSCOPY Bilateral    "cartilage"  . Laparascopic cryoablation of left kidney  08/2006   Dr. Gladis Riffle for renal cell cancer  . LEFT HEART CATHETERIZATION WITH CORONARY ANGIOGRAM N/A 05/11/2011   Procedure: LEFT HEART CATHETERIZATION WITH CORONARY ANGIOGRAM;  Surgeon: Jolaine Artist, MD;  Location: [facility] CATH LAB;  Service: Cardiovascular;  Laterality: N/A;  . LUNG SURGERY Right    lung nodule removed from the right side  . PR VEIN BYPASS GRAFT,AORTO-FEM-POP  05/27/2010  . SHOULDER ARTHROSCOPY Bilateral    'Spurs"  . TUBAL LIGATION       reports that she has been smoking cigarettes.  She has a 75.00 pack-year smoking history. she has never used smokeless tobacco. She reports that she does not drink alcohol or use drugs.  No Known Allergies  Family History  Problem Relation Age of Onset  . Heart disease Mother   . Lung cancer Mother   . Cancer Mother        Lung  . Heart disease Father   . Lung cancer Father   . Cancer Father        Lung  . Heart disease Sister        CABG- Open Heart    Prior to Admission medications   Medication Sig Start Date End Date Taking? Authorizing Provider  ACCU-CHEK FASTCLIX LANCETS MISC Use 4 per day to check blood sugar dx code E11.9 11/29/14  Yes Elayne Snare, MD  amLODipine (NORVASC) 10 MG tablet Take 10 mg by mouth daily. 04/06/17  Yes  [provider]  clindamycin (CLEOCIN) 150 MG capsule Take 1 capsule by mouth 2 (two) times daily. 03/29/17  Yes [provider]  clopidogrel (PLAVIX) 75 MG tablet Take 75 mg by mouth daily.   Yes [provider]  DULoxetine (CYMBALTA) 60 MG capsule TAKE ONE CAPSULE PPO EVERY DAY 03/03/17  Yes Elayne Snare, MD  ferrous sulfate 325 (65 FE) MG tablet Take 325 mg by mouth daily with breakfast.    Yes [provider]  HYDROcodone-acetaminophen (NORCO) 10-325 MG tablet Take 1 tablet by mouth 3 (three) times daily as needed (for pain). 11/01/16  Yes Osei-Bonsu, Iona Beard, MD  ibuprofen (ADVIL,MOTRIN) 200 MG tablet Take 200 mg by mouth every 6 (six) hours as needed for moderate pain.  Yes [provider]  Insulin Glargine (TOUJEO SOLOSTAR) 300 UNIT/ML SOPN Inject 30 Units into the skin daily. Patient taking differently: Inject 60 Units into the skin daily.  10/15/16  Yes Patrecia Pour, MD  insulin lispro (HUMALOG KWIKPEN) 100 UNIT/ML KiwkPen INJECT 5 UNITS 3 TIMES A DAY AS NEEDED FOR BLOOD SUGAR Patient taking differently: Inject 10 Units into the skin 3 (three) times daily as needed (blood sugar).  10/15/16  Yes Patrecia Pour, MD  Insulin Pen Needle (BD PEN NEEDLE NANO U/F) 32G X 4 MM MISC Use to inject insulin 10/30/14  Yes Elayne Snare, MD  lansoprazole (PREVACID) 30 MG capsule Take 1 capsule (30 mg total) by mouth daily. 05/11/16  Yes Vann, Tomi Bamberger, DO  levothyroxine (SYNTHROID, LEVOTHROID) 175 MCG tablet Take 175 mcg by mouth daily. 04/06/17  Yes [provider]  LORazepam (ATIVAN) 1 MG tablet Take 1 tablet (1 mg total) by mouth 3 times/day as needed-between meals & bedtime for anxiety. 06/28/15  Yes Elwin Mocha, MD  metFORMIN (GLUCOPHAGE-XR) 500 MG 24 hr tablet Take 500 mg by mouth 2 (two) times daily.    Yes [provider]  metoprolol succinate (TOPROL XL) 25 MG 24 hr tablet Take 1 tablet (25 mg total) by mouth daily. 04/20/17  Yes Imogene Burn, PA-C  nortriptyline (PAMELOR) 75 MG capsule Take 75 mg by mouth at bedtime. 01/24/14  Yes [provider]  potassium chloride (KLOR-CON 10) 10 MEQ tablet Take 1 tablet (10 mEq total) by mouth daily. 03/19/16  Yes Elayne Snare, MD  pravastatin (PRAVACHOL) 20 MG tablet Take 20 mg by mouth daily.   Yes [provider]  QUEtiapine (SEROQUEL) 100 MG tablet Take 100 mg by mouth at bedtime. 09/25/16  Yes [provider]  tolterodine (DETROL LA) 4 MG 24 hr capsule Take 4 mg by mouth daily.    Yes [provider]  Vitamin D, Ergocalciferol, (DRISDOL) 50000 units CAPS capsule Take 50,000 Units by mouth once a week. 09/18/16  Yes [provider]  nicotine (NICODERM CQ) 21 mg/24hr patch Place 1 patch (21 mg total) onto the skin daily. Patient not taking: Reported on 04/23/2017 04/20/17   Imogene Burn, PA-C    Physical Exam: Vitals:   04/22/17 2230 04/23/17 0157 04/23/17 0404 04/23/17 0600  BP: 111/68 (!) 141/121 (!) 114/51 110/64  Pulse: 61 60 68 70  Resp: 20 20 19 20   Temp:  97.6 F (36.4 C) 98.1 F (36.7 C) 98 F (36.7 C)  TempSrc:  Oral Oral Oral  SpO2: 100% 97% 98% 98%  Weight:  156 lb 12 oz (71.1 kg)    Height:  5\' 1"  (1.549 m)        Constitutional: Moderately built and nourished. Vitals:   04/22/17 2230 04/23/17 0157 04/23/17 0404 04/23/17 0600  BP: 111/68 (!) 141/121 (!) 114/51 110/64  Pulse: 61 60 68 70  Resp: 20 20 19 20   Temp:  97.6 F (36.4 C) 98.1 F (36.7 C) 98 F (36.7 C)  TempSrc:  Oral Oral Oral  SpO2: 100% 97% 98% 98%  Weight:  156 lb 12 oz (71.1 kg)    Height:  5\' 1"  (1.549 m)     Eyes: Anicteric no pallor. ENMT: No discharge from the ears eyes nose or mouth. Neck: No mass felt.  No neck rigidity. Respiratory: No rhonchi or crepitation. Cardiovascular: S1-S2 heard no murmurs appreciated. Abdomen: Soft nontender bowel sounds present. Musculoskeletal: No edema.  No joint  effusion. Skin: No rash. Neurologic: Alert  awake oriented to time place and person.  Moves all extremities 5 x 5. Psychiatric: Appears normal.  Normal affect.   Labs on Admission: I have personally reviewed following labs and imaging studies  CBC: Recent Labs  Lab 04/20/17 1047 04/22/17 2106 04/22/17 2121 04/23/17 0437  WBC 7.6 8.8  --  9.0  NEUTROABS 5.1 5.2  --  5.5  HGB 11.9 10.0* 9.9* 10.5*  HCT 34.9 30.2* 29.0* 31.5*  MCV 89 89.1  --  89.2  PLT 350 310  --  878   Basic Metabolic Panel: Recent Labs  Lab 04/20/17 1047 04/22/17 2106 04/22/17 2121 04/23/17 0437  NA 138 135 139 137  K 4.6 4.2 4.2 4.0  CL 100 105 106 106  CO2 20 19*  --  18*  GLUCOSE 275* 160* 154* 191*  BUN 17 26* 29* 26*  CREATININE 1.45* 1.85* 1.80* 1.68*  CALCIUM 9.6 8.6*  --  8.9   GFR: Estimated Creatinine Clearance: 28.5 mL/min (A) (by C-G formula based on SCr of 1.68 mg/dL (H)). Liver Function Tests: Recent Labs  Lab 04/20/17 1047 04/22/17 2106 04/23/17 0437  AST 8 18 15   ALT 9 12* 12*  ALKPHOS 120* 92 88  BILITOT 0.2 0.6 0.3  PROT 7.8 6.6 6.6  ALBUMIN 4.4 3.5 3.4*   No results for input(s): LIPASE, AMYLASE in the last 168 hours. No results for input(s): AMMONIA in the last 168 hours. Coagulation Profile: Recent Labs  Lab 04/22/17 2106  INR 0.97   Cardiac Enzymes: Recent Labs  Lab 04/23/17 0437 04/23/17 0709  CKTOTAL  --  30*  TROPONINI <0.03  --    BNP (last 3 results) No results for input(s): PROBNP in the last 8760 hours. HbA1C: No results for input(s): HGBA1C in the last 72 hours. CBG: Recent Labs  Lab 04/22/17 2053 04/23/17 0601  GLUCAP 176* 207*   Lipid Profile: Recent Labs    04/20/17 1047  CHOL 219*  HDL 48  LDLCALC Comment  TRIG 421*  CHOLHDL 4.6*   Thyroid Function Tests: Recent Labs    04/23/17 0437  TSH 9.596*   Anemia Panel: No results for input(s): VITAMINB12, FOLATE, FERRITIN, TIBC, IRON, RETICCTPCT in the last 72 hours. Urine analysis:    Component Value Date/Time    COLORURINE YELLOW 04/23/2017 0035   APPEARANCEUR HAZY (A) 04/23/2017 0035   LABSPEC 1.008 04/23/2017 0035   PHURINE 5.0 04/23/2017 0035   GLUCOSEU 50 (A) 04/23/2017 0035   GLUCOSEU >=1000 (A) 12/13/2015 1353   HGBUR NEGATIVE 04/23/2017 0035   BILIRUBINUR NEGATIVE 04/23/2017 0035   KETONESUR NEGATIVE 04/23/2017 0035   PROTEINUR NEGATIVE 04/23/2017 0035   UROBILINOGEN 0.2 12/13/2015 1353   NITRITE NEGATIVE 04/23/2017 0035   LEUKOCYTESUR NEGATIVE 04/23/2017 0035   Sepsis Labs: @LABRCNTIP (procalcitonin:4,lacticidven:4) )No results found for this or any previous visit (from the past 240 hour(s)).   Radiological Exams on Admission: Ct Head Wo Contrast  Result Date: 04/22/2017 CLINICAL DATA:  Generalized weakness with headache since this afternoon. EXAM: CT HEAD WITHOUT CONTRAST TECHNIQUE: Contiguous axial images were obtained from the base of the skull through the vertex without intravenous contrast. COMPARISON:  10/30/2016 FINDINGS: Brain: No evidence of acute infarction, hemorrhage, hydrocephalus, extra-axial collection or mass lesion/mass effect. Minimal chronic ischemic microvascular disease. Vascular: No hyperdense vessel or unexpected calcification. Skull: Normal. Negative for fracture or focal lesion. Sinuses/Orbits: Orbits are normal. Evidence of previous sinus surgery with fenestration of the medial wall the right maxillary  sinus. Sinuses are otherwise clear. Mastoid air cells are clear. Other: None. IMPRESSION: No acute findings. Mild chronic ischemic microvascular disease. Electronically Signed   By: Marin Olp M.D.   On: 04/22/2017 21:53   Mr Brain Wo Contrast  Result Date: 04/23/2017 CLINICAL DATA:  72 y/o F; generalized weakness, headache, shortness of breath. History of stroke and heart attack. EXAM: MRI HEAD WITHOUT CONTRAST TECHNIQUE: Multiplanar, multiecho pulse sequences of the brain and surrounding structures were obtained without intravenous contrast. COMPARISON:  04/22/2017  CT of the head.  09/01/2016 MRI of the head. FINDINGS: Brain: No acute infarction, hemorrhage, hydrocephalus, extra-axial collection or mass lesion. Severalnonspecific foci of T2 FLAIR hyperintense signal abnormality in subcortical and periventricular white matter are compatible withmild to moderatechronic microvascular ischemic changes for age. Mild to moderatebrain parenchymal volume loss. Vascular: Normal flow voids. Skull and upper cervical spine: Normal marrow signal. Sinuses/Orbits: Right-sided ethmoidectomy and maxillary antrostomy. Mild mucosal thickening of the ethmoid cavity and right maxillary sinus. Bilateral intra-ocular lens replacement. Otherwise negative. Other: None. IMPRESSION: 1. No acute intracranial abnormality. 2. Stable mild-to-moderate chronic microvascular ischemic changes and parenchymal volume loss of the brain. Electronically Signed   By: Kristine Garbe M.D.   On: 04/23/2017 00:18    EKG: Independently reviewed.  Sinus rhythm with APCs.  Assessment/Plan Principal Problem:   Dizziness Active Problems:   Hypothyroidism   Insulin dependent type 2 diabetes mellitus, controlled (HCC)   Essential hypertension   COPD (chronic obstructive pulmonary disease) (HCC)   CAD (coronary artery disease)   PVD (peripheral vascular disease) ([place])    1. Dizziness and weakness  Patient was hypotensive on arrival which could be contributing to patient's weakness. Antihypertensive (amlodipine and toprol XL) on hold. Recently started on Toprol XL 25 mg daily for tachycardia. BP has improved overnight. Elevated D-dimers in the setting of weakness, SOB, hypotension could consider PE. Troponin 0.03.  --Continue to monitor on telemetry. 2. Recent chest pains with palpitations  Patient is originally scheduled to follow-up with cardiology for stress test.  Will consult cardiology while inpatient. D dimer mildly elevated 1.19.  --Consult Cardiology --Continue to trend troponin   3. Hypothyroidism,elevaed Patient is currently on Synthroid 175 mg .TSH was 9.596 .  Recent TSH was 14.7. --Follow up on T4 4. Hypertension, stable Presently holding off antihypertensives due to initial hypotension while presentation.  Will keep patient on PRN IV hydralazine for now.  Closely follow blood pressure trends. 5. Chronic kidney disease stage III, improving Creatinine appears to be mildly worsened, baseline 1.2-1.4. On admission 1.8, likely in the setting of poor po intake given hypotension on admission and mild Lactic acidosis.  --Follow up on BMP  6. Diabetes mellitus type 2 On insulin patient takes 60 units of long-acting insulin in the morning which will be continued along with sliding scale coverage. 7. Peripheral vascular disease Patient is status post femoropopliteal bypass.  Continue antiplatelet agents and statins. 8. COPD  Patient with O2 sat 98 % on RA. Not actively wheezing. 9. Anemia Likely from chronic disease -reviewed old labs.  Appears to be at baseline.   --Continue  iron supplementation 10. Hyperlipidemia Elevated cholesterol 219  With elevated LDL and triglycerides. On pravachol. 11. Tobacco use  1 pack a day.  --Nicotine patches  DVT prophylaxis: Heparin. Code Status: Full code. Family Communication: Discussed with patient. Disposition Plan: Home. Consults called: Cardiology. Admission status: Observation.   Marjie Skiff MD [name] Pager [phone].  If 7PM-7AM, please contact night-coverage [email or web address] Password  [email or web address]  04/23/2017, 8:54 AM

## 2017-04-23 NOTE — Progress Notes (Signed)
Inpatient Diabetes Program Recommendations  AACE/ADA: New Consensus Statement on Inpatient Glycemic Control (2015)  Target Ranges:  Prepandial:   less than 140 mg/dL      Peak postprandial:   less than 180 mg/dL (1-2 hours)      Critically ill patients:  140 - 180 mg/dL   Lab Results  Component Value Date   GLUCAP 243 (H) 04/23/2017   HGBA1C 9.2 (H) 10/31/2016    Review of Glycemic ControlResults for DEJANAY, WAMBOLDT (MRN [id number]) as of 04/23/2017 12:29  Ref. Range 04/22/2017 20:53 04/23/2017 06:01 04/23/2017 11:25  Glucose-Capillary Latest Ref Range: 65 - 99 mg/dL 176 (H) 207 (H) 243 (H)    Diabetes history: Type 2 DM Outpatient Diabetes medications: Toujeo 60 units daily, Humalog 10 units tid prn Current orders for Inpatient glycemic control:  Lantus 60 units daily, Novolog sensitive tid with meals  Inpatient Diabetes Program Recommendations:   Home dose of basal insulin started this AM. If post-prandial blood sugars continue to be >180 mg/dL, consider adding Novolog meal coverage 4 units tid with meals.   Thanks,  Adah Perl, RN, BC-ADM Inpatient Diabetes Coordinator Pager [phone] (8a-5p)

## 2017-04-23 NOTE — Care Management Obs Status (Signed)
[place] NOTIFICATION   Patient Details  Name: April Branch MRN: [id number] Date of Birth: 1945/12/05   Medicare Observation Status Notification Given:  Yes    Pollie Friar, RN 04/23/2017, 4:34 PM

## 2017-04-23 NOTE — ED Notes (Signed)
Patient is stable and ready to be transport to the floor at this time.  Report was called to 3W Personal assistant.  Belongings taken with the patient to the floor.

## 2017-04-23 NOTE — Consult Note (Signed)
Cardiology Consultation:   Patient ID: April Branch; [id number]; [birth year]   Admit date: 04/22/2017 Date of Consult: 04/23/2017  Primary Care Provider: [name], MD Primary Cardiologist: Dr Rae Lips, [facility] 04/20/2017 Primary Electrophysiologist:  N/A   Patient Profile:   April Branch is a 72 y.o. female with a hx of non-obs dz by cath 2013, nl MV 2016, HTN, HLD, hypothyroid, bradycardia improved off Toprol XL 100 mg, 40-59% carotid dz, PAD s/p L fem-pop bpg, CKD III, tob use, who is being seen today for the evaluation of chest pain at the request of Dr Cruzita Lederer.  History of Present Illness:   [name] was seen in the office 02/26 for chest pain and DOE. ECG not acute and hx non-obs dz>>outpt MV scheduled. She was started on a low dose of Toprol XL 25 mg for palpitations. TSH was rechecked with other labs, improved but still elevated at 14.7. Lipid profile was abnl, pt on Pravachol. Still smoking>>nicotine patch.   02/28, pt developed a severe HA about 4 pm. She was weak and lethargic. She also reported R-sided weakness. Tx to ER>>head CT and MRI were not acute, ?TIA. She was hypotensive on admission>>rx w/ IVF and improved. Not septic. Anti-hypertensives held on admit. Intermittent CP>>cards consult.   Of note, pt d-dimer was elevated, Cr too high for CT>>VQ scan performed, but not read yet.  Pt gets CP every day, lasts about 20 minutes. Gets 1-2 episodes per day. All are 10/10. She will lay down and feel better. They are not exertional. She will feel a hard throb and then her heart will beat hard but not that fast. She will lie down and sx will improve in about 20 minutes. Metoprolol did not make a difference. She has taken nitro in the past for the pain, no help.   She has been struggling since her sister died about a month ago. Lived close by, it has been hard for her. She agrees that she may be depressed. Does not feel like doing anything, sits and watches TV all  day. Has had depression sx since husband died 46 years ago. Was taken off her Ativan by PCP, that is upsetting to her. Has never seen a psychiatrist.  CP episodes started after her husband died 41 years ago, they have not changed much. Ativan was calming to her, nothing else has helped.   Past Medical History:  Diagnosis Date  . Anemia   . Anxiety   . Arthritis   . CAD (coronary artery disease) 2016   non-obstructive at cath  . Colon polyps 09/11/2010   Tubular adenoma  . COPD 12/14/2008   PATIENT DENIES    . Depression   . Diabetic coma ([place]) Feb. 2014  . Fall at home 2016   x 2 in January 2016, Left knee  . Fibromyalgia   . GERD 07/20/2006  . Headache(784.0)   . History of transient ischemic attack (TIA)   . HPV (human papilloma virus) infection   . Hyperlipidemia   . Hypertension   . Hypothyroidism   . Insulin dependent type 2 diabetes mellitus, controlled ([place]) 1989  . Lumbar disc disease   . Neuromuscular disorder ([place])    PERIPHERAL NEUROPATHY  . Peripheral vascular disease ([place])   . Personal history of malignant neoplasm of kidney(V10.52) 12/14/2008   Laparoscopic biopsy and cryoablation 7/08 Dr. Vernie Shanks   . Renal disorder    chronic kidney disease   . Stroke [facility])    3 MINI  STROKES  RT SIDED WEAKNESS  . Vertigo     Past Surgical History:  Procedure Laterality Date  . ABDOMINAL AORTAGRAM N/A 08/21/2011   Procedure: ABDOMINAL Maxcine Ham;  Surgeon: Elam Dutch, MD;  Location: [facility] CATH LAB;  Service: Cardiovascular;  Laterality: N/A;  . ABDOMINAL AORTAGRAM N/A 03/16/2014   Procedure: ABDOMINAL Maxcine Ham;  Surgeon: Elam Dutch, MD;  Location: [facility] CATH LAB;  Service: Cardiovascular;  Laterality: N/A;  . ABDOMINAL HYSTERECTOMY    . APPENDECTOMY    . BLADDER SUSPENSION    . CARDIAC CATHETERIZATION    . CHOLECYSTECTOMY OPEN    . ESOPHAGOGASTRODUODENOSCOPY (EGD) WITH PROPOFOL N/A 05/16/2014   Procedure: ESOPHAGOGASTRODUODENOSCOPY (EGD) WITH PROPOFOL with  Balloon dilation;  Surgeon: Milus Banister, MD;  Location: [place];  Service: Endoscopy;  Laterality: N/A;  . ESOPHAGOGASTRODUODENOSCOPY (EGD) WITH PROPOFOL N/A 09/02/2015   Procedure: ESOPHAGOGASTRODUODENOSCOPY (EGD) WITH PROPOFOL;  Surgeon: Doran Stabler, MD;  Location: [place];  Service: Gastroenterology;  Laterality: N/A;  . FEMORAL-POPLITEAL BYPASS GRAFT  10/12/2011   Procedure: BYPASS GRAFT FEMORAL-POPLITEAL ARTERY;  Surgeon: Elam Dutch, MD;  Location: [facility] OR;  Service: Vascular;  Laterality: Left;  Left Femoral-Popliteal Bypass Graft using 86mm x 80cm Propaten Graft with intraop arteriogram times one.  . FEMORAL-POPLITEAL BYPASS GRAFT Left 04/17/2014   Procedure: REDO LEFT FEMORAL-POPLITEAL ARTERY BYPASS USING GORE PROPATEN 68mmx80cm GRAFT;  Surgeon: Elam Dutch, MD;  Location: [place];  Service: Vascular;  Laterality: Left;  . FOOT SURGERY Left    "bone spurs"  . INTRAOPERATIVE ARTERIOGRAM  10/12/2011   Procedure: INTRA OPERATIVE ARTERIOGRAM;  Surgeon: Elam Dutch, MD;  Location: [place];  Service: Vascular;  Laterality: Left;  . KNEE ARTHROSCOPY Bilateral    "cartilage"  . Laparascopic cryoablation of left kidney  08/2006   Dr. Gladis Riffle for renal cell cancer  . LEFT HEART CATHETERIZATION WITH CORONARY ANGIOGRAM N/A 05/11/2011   Procedure: LEFT HEART CATHETERIZATION WITH CORONARY ANGIOGRAM;  Surgeon: Jolaine Artist, MD;  Location: [facility] CATH LAB;  Service: Cardiovascular;  Laterality: N/A;  . LUNG SURGERY Right    lung nodule removed from the right side  . PR VEIN BYPASS GRAFT,AORTO-FEM-POP  05/27/2010  . SHOULDER ARTHROSCOPY Bilateral    'Spurs"  . TUBAL LIGATION       Prior to Admission medications   Medication Sig Start Date End Date Taking? Authorizing Provider  ACCU-CHEK FASTCLIX LANCETS MISC Use 4 per day to check blood sugar dx code E11.9 11/29/14  Yes Elayne Snare, MD  amLODipine (NORVASC) 10 MG tablet Take 10 mg by mouth daily. 04/06/17  Yes [provider]  clindamycin (CLEOCIN) 150 MG capsule Take 1 capsule by mouth 2 (two) times daily. 03/29/17  Yes [provider]  clopidogrel (PLAVIX) 75 MG tablet Take 75 mg by mouth daily.   Yes [provider]  DULoxetine (CYMBALTA) 60 MG capsule TAKE ONE CAPSULE PPO EVERY DAY 03/03/17  Yes Elayne Snare, MD  ferrous sulfate 325 (65 FE) MG tablet Take 325 mg by mouth daily with breakfast.    Yes [provider]  HYDROcodone-acetaminophen (NORCO) 10-325 MG tablet Take 1 tablet by mouth 3 (three) times daily as needed (for pain). 11/01/16  Yes Osei-Bonsu, Iona Beard, MD  ibuprofen (ADVIL,MOTRIN) 200 MG tablet Take 200 mg by mouth every 6 (six) hours as needed for moderate pain.    Yes [provider]  Insulin Glargine (TOUJEO SOLOSTAR) 300 UNIT/ML SOPN Inject 30 Units into the skin daily. Patient taking  differently: Inject 60 Units into the skin daily.  10/15/16  Yes Patrecia Pour, MD  insulin lispro (HUMALOG KWIKPEN) 100 UNIT/ML KiwkPen INJECT 5 UNITS 3 TIMES A DAY AS NEEDED FOR BLOOD SUGAR Patient taking differently: Inject 10 Units into the skin 3 (three) times daily as needed (blood sugar).  10/15/16  Yes Patrecia Pour, MD  Insulin Pen Needle (BD PEN NEEDLE NANO U/F) 32G X 4 MM MISC Use to inject insulin 10/30/14  Yes Elayne Snare, MD  lansoprazole (PREVACID) 30 MG capsule Take 1 capsule (30 mg total) by mouth daily. 05/11/16  Yes Vann, Tomi Bamberger, DO  levothyroxine (SYNTHROID, LEVOTHROID) 175 MCG tablet Take 175 mcg by mouth daily. 04/06/17  Yes [provider]  LORazepam (ATIVAN) 1 MG tablet Take 1 tablet (1 mg total) by mouth 3 times/day as needed-between meals & bedtime for anxiety. 06/28/15  Yes Elwin Mocha, MD  metFORMIN (GLUCOPHAGE-XR) 500 MG 24 hr tablet Take 500 mg by mouth 2 (two) times daily.    Yes [provider]  metoprolol succinate (TOPROL XL) 25 MG 24 hr tablet Take 1 tablet (25 mg total) by mouth daily. 04/20/17  Yes Imogene Burn, PA-C    nortriptyline (PAMELOR) 75 MG capsule Take 75 mg by mouth at bedtime. 01/24/14  Yes [provider]  potassium chloride (KLOR-CON 10) 10 MEQ tablet Take 1 tablet (10 mEq total) by mouth daily. 03/19/16  Yes Elayne Snare, MD  pravastatin (PRAVACHOL) 20 MG tablet Take 20 mg by mouth daily.   Yes [provider]  QUEtiapine (SEROQUEL) 100 MG tablet Take 100 mg by mouth at bedtime. 09/25/16  Yes [provider]  tolterodine (DETROL LA) 4 MG 24 hr capsule Take 4 mg by mouth daily.    Yes [provider]  Vitamin D, Ergocalciferol, (DRISDOL) 50000 units CAPS capsule Take 50,000 Units by mouth once a week. 09/18/16  Yes [provider]  nicotine (NICODERM CQ) 21 mg/24hr patch Place 1 patch (21 mg total) onto the skin daily. Patient not taking: Reported on 04/23/2017 04/20/17   Imogene Burn, PA-C    Inpatient Medications: Scheduled Meds: . clopidogrel  75 mg Oral Daily  . DULoxetine  60 mg Oral Daily  . feeding supplement (ENSURE ENLIVE)  237 mL Oral BID BM  . ferrous sulfate  325 mg Oral Q breakfast  . fesoterodine  4 mg Oral Daily  . heparin  5,000 Units Subcutaneous Q8H  . insulin aspart  0-9 Units Subcutaneous TID WC  . insulin glargine  60 Units Subcutaneous Daily  . levothyroxine  175 mcg Oral Daily  . nortriptyline  75 mg Oral QHS  . pantoprazole  40 mg Oral Daily  . pravastatin  20 mg Oral Daily  . QUEtiapine  100 mg Oral QHS   Continuous Infusions:  PRN Meds: acetaminophen **OR** acetaminophen, hydrALAZINE, HYDROcodone-acetaminophen, loratadine, LORazepam, ondansetron **OR** ondansetron (ZOFRAN) IV  Allergies:   No Known Allergies  Social History:   Social History   Socioeconomic History  . Marital status: Widowed    Spouse name: Not on file  . Number of children: 2  . Years of education: 8th grade  . Highest education level: Not on file  Social Needs  . Financial resource strain: Not on file  . Food insecurity - worry: Not on  file  . Food insecurity - inability: Not on file  . Transportation needs - medical: Not on file  . Transportation needs - non-medical: Not on file  Occupational History  . 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 Smoking status: Current Every Day Smoker    Packs/day: 1.50    Years: 50.00    Pack years: 75.00    Types: Cigarettes    Last attempt to quit: 06/24/2014    Years since quitting: 2.8  . Smokeless tobacco: Never Used  Substance and Sexual Activity  . Alcohol use: No    Alcohol/week: 0.0 oz  . Drug use: No  . Sexual activity: No  Other Topics Concern  . Not on file  Social History Narrative   Widow. Two sons. Husband died of lung cancer.     Right-handed.   She lives at home with her friend, [name] and her son.   2-4 bottles of Coke per day.          Family History:   Family History  Problem Relation Age of Onset  . Heart disease Mother   . Lung cancer Mother   . Cancer Mother        Lung  . Heart disease Father   . Lung cancer Father   . Cancer Father        Lung  . Heart disease Sister        CABG- Open Heart   Family Status:  Family Status  Relation Name Status  . Mother  Deceased at age 65       lung cancer  . Father  Deceased at age 20's       lung cancer  . Sister  (Not Specified)    ROS:  Please see the history of present illness.  All other ROS reviewed and negative.     Physical Exam/Data:   Vitals:   04/23/17 0157 04/23/17 0404 04/23/17 0600 04/23/17 0918  BP: (!) 141/121 (!) 114/51 110/64 134/65  Pulse: 60 68 70 71  Resp: 20 19 20 19   Temp: 97.6 F (36.4 C) 98.1 F (36.7 C) 98 F (36.7 C) 98.9 F (37.2 C)  TempSrc: Oral Oral Oral Oral  SpO2: 97% 98% 98% 96%  Weight: 156 lb 12 oz (71.1 kg)     Height: 5\' 1"  (1.549 m)       Intake/Output Summary (Last 24 hours) at 04/23/2017 1406 Last data filed at 04/23/2017 0128 Gross per 24 hour  Intake 1000 ml  Output -  Net 1000 ml   Filed Weights   04/22/17 2056  04/23/17 0157  Weight: 156 lb (70.8 kg) 156 lb 12 oz (71.1 kg)   Body mass index is 29.62 kg/m.  General:  Well nourished, well developed, in no acute distress HEENT: normal Lymph: no adenopathy Neck: no JVD Endocrine:  No thryomegaly Vascular: No carotid bruits; 3/4 extremity pulses 2+,  Cardiac:  normal S1, S2; RRR; soft murmur  Lungs:  clear to auscultation bilaterally, no wheezing, rhonchi or rales  Abd: soft, nontender, no hepatomegaly  Ext: no edema Musculoskeletal:  No deformities, BUE and BLE strength normal and equal Skin: warm and dry  Neuro:  CNs 2-12 intact, no focal abnormalities noted Psych:  Normal affect   EKG:  The EKG was personally reviewed and demonstrates:  SR, HR 63, frequent PACs, diffuse T wave flattening.  Telemetry:  Telemetry was personally reviewed and demonstrates:    Relevant CV Studies:  ECHO: 10/31/2016 - Left ventricle: The cavity size was normal. Systolic function was   normal. The estimated ejection fraction was in the range of 60%  to 65%. Wall motion was normal; there were no regional wall   motion abnormalities. Mild focal basal septal hypertrophy.   Diastolic dysfunction, grade indeterminate. Elevated filling   pressures. - Mitral valve: There was mild regurgitation. - Left atrium: The atrium was mildly dilated. - Right ventricle: Systolic function was reduced. - Atrial septum: There was increased thickness of the septum,   consistent with lipomatous hypertrophy. - Pericardium, extracardiac: A trivial pericardial effusion was   identified.  CATH: 05/11/2011 Left main:  Normal LAD: Large vessel which tails to the L distally. 20-30% mid lesion. LCX: Large co-dominant vessel. Large OM-1. Small OM-2. Several small PLs. 30% stenosis mid AV groove RCA: Small co-dominant vessel. Diffuse 30% prox through mid section. 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 Normal LVEF Plan/Discussion: Despite CAD, I suspect CP is non-cardiac. Continue medical treatment. Consider GI work-up.  Laboratory Data:  Chemistry Recent Labs  Lab 04/20/17 1047 04/22/17 2106 04/22/17 2121 04/23/17 0437  NA 138 135 139 137  K 4.6 4.2 4.2 4.0  CL 100 105 106 106  CO2 20 19*  --  18*  GLUCOSE 275* 160* 154* 191*  BUN 17 26* 29* 26*  CREATININE 1.45* 1.85* 1.80* 1.68*  CALCIUM 9.6 8.6*  --  8.9  GFRNONAA 37* 27*  --  30*  GFRAA 42* 31*  --  35*  ANIONGAP  --  11  --  13    Lab Results  Component Value Date   ALT 12 (L) 04/23/2017   AST 15 04/23/2017   ALKPHOS 88 04/23/2017   BILITOT 0.3 04/23/2017   Hematology Recent Labs  Lab 04/20/17 1047 04/22/17 2106 04/22/17 2121 04/23/17 0437  WBC 7.6 8.8  --  9.0  RBC 3.94 3.39*  --  3.53*  HGB 11.9 10.0* 9.9* 10.5*  HCT 34.9 30.2* 29.0* 31.5*  MCV 89 89.1  --  89.2  MCH 30.2 29.5  --  29.7  MCHC 34.1 33.1  --  33.3  RDW 14.7 14.2  --  14.6  PLT 350 310  --  296   Cardiac Enzymes Recent Labs  Lab 04/23/17 0437 04/23/17 0946  TROPONINI <0.03 <0.03    Recent Labs  Lab 04/22/17 2119  TROPIPOC 0.00    DDimer  Recent Labs  Lab 04/23/17 0709  DDIMER 1.19*   TSH:  Lab Results  Component Value Date   TSH 9.596 (H) 04/23/2017   Lipids: Lab Results  Component Value Date   CHOL 219 (H) 04/20/2017   HDL 48 04/20/2017   LDLCALC Comment 04/20/2017   LDLDIRECT 82.0 08/02/2015   TRIG 421 (H) 04/20/2017   CHOLHDL 4.6 (H) 04/20/2017    Radiology/Studies:  Ct Head Wo Contrast  Result Date: 04/22/2017 CLINICAL DATA:  Generalized weakness with headache since this afternoon. EXAM: CT HEAD WITHOUT CONTRAST TECHNIQUE: Contiguous axial images were obtained from the base of the skull through the vertex without intravenous contrast.  COMPARISON:  10/30/2016 FINDINGS: Brain: No evidence of acute infarction, hemorrhage, hydrocephalus, extra-axial collection or mass lesion/mass effect. Minimal chronic ischemic microvascular disease. Vascular: No hyperdense vessel or unexpected calcification. Skull: Normal. Negative for fracture or focal lesion. Sinuses/Orbits: Orbits are normal. 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 Sinuses are otherwise clear. Mastoid air cells  are clear. Other: None. IMPRESSION: No acute findings. Mild chronic ischemic microvascular disease. Electronically Signed   By: Marin Olp M.D.   On: 04/22/2017 21:53   Mr Brain Wo Contrast  Result Date: 04/23/2017 CLINICAL DATA:  72 y/o F; generalized weakness, headache, shortness of breath. History of stroke and heart attack. EXAM: MRI HEAD WITHOUT CONTRAST TECHNIQUE: Multiplanar, multiecho pulse sequences of the brain and surrounding structures were obtained without intravenous contrast. COMPARISON:  04/22/2017 CT of the head.  09/01/2016 MRI of the head. FINDINGS: Brain: No acute infarction, hemorrhage, hydrocephalus, extra-axial collection or mass lesion. Severalnonspecific foci of T2 FLAIR hyperintense signal abnormality in subcortical and periventricular white matter are compatible withmild to moderatechronic microvascular ischemic changes for age. Mild to moderatebrain parenchymal volume loss. Vascular: Normal flow voids. Skull and upper cervical spine: Normal marrow signal. Sinuses/Orbits: Right-sided ethmoidectomy and maxillary antrostomy. Mild mucosal thickening of the ethmoid cavity and right maxillary sinus. Bilateral intra-ocular lens replacement. Otherwise negative. Other: None. IMPRESSION: 1. No acute intracranial abnormality. 2. Stable mild-to-moderate chronic microvascular ischemic changes and parenchymal volume loss of the brain. Electronically Signed   By: Kristine Garbe M.D.   On: 04/23/2017  00:18    Assessment and Plan:   Principal Problem: 1. Dizziness - BP low, sx improved w/ IVF and BP meds on hold - no infection/sepsis - per IM  Active Problems: 2.  Hypothyroidism - TSH lower but not normal - per IM  3.  Insulin dependent type 2 diabetes mellitus, controlled (HCC) - per IM  4.  Essential hypertension - per IM  5.  COPD (chronic obstructive pulmonary disease) (HCC) - no wheeze, O2 sats ok - per IM  6. Chest pain, hx CAD (coronary artery disease) - minimal per cath 2013 - nl MV 2016 - poor CRF control - pt has MV scheduled for 03/07 already - since she had VQ scan today, cannot do MV for 48 hours (confirmed this w/ nuc med staff) - no cardiac CT due to elevated Cr  7.  PVD (peripheral vascular disease) (HCC) - palpable pulses, though weaker on the L  - recent ABIs show R 0.69, L 0.72, L TBI weak at 0.49 - Keep scheduled f/u with [facility] - per IM  8. Tob use - cessation encouraged.   For questions or updates, please contact [facility] Please consult [email or web address] for contact info under Cardiology/STEMI.   SignedRosaria Ferries, PA-C  04/23/2017 2:06 PM

## 2017-04-24 ENCOUNTER — Other Ambulatory Visit: Payer: Self-pay

## 2017-04-24 DIAGNOSIS — J438 Other emphysema: Secondary | ICD-10-CM

## 2017-04-24 DIAGNOSIS — I10 Essential (primary) hypertension: Secondary | ICD-10-CM | POA: Diagnosis not present

## 2017-04-24 DIAGNOSIS — I2583 Coronary atherosclerosis due to lipid rich plaque: Secondary | ICD-10-CM

## 2017-04-24 DIAGNOSIS — E039 Hypothyroidism, unspecified: Secondary | ICD-10-CM | POA: Diagnosis not present

## 2017-04-24 DIAGNOSIS — R42 Dizziness and giddiness: Secondary | ICD-10-CM

## 2017-04-24 DIAGNOSIS — Z794 Long term (current) use of insulin: Secondary | ICD-10-CM

## 2017-04-24 DIAGNOSIS — E119 Type 2 diabetes mellitus without complications: Secondary | ICD-10-CM | POA: Diagnosis not present

## 2017-04-24 DIAGNOSIS — I739 Peripheral vascular disease, unspecified: Secondary | ICD-10-CM

## 2017-04-24 DIAGNOSIS — I2510 Atherosclerotic heart disease of native coronary artery without angina pectoris: Secondary | ICD-10-CM

## 2017-04-24 DIAGNOSIS — R531 Weakness: Secondary | ICD-10-CM | POA: Diagnosis not present

## 2017-04-24 LAB — GLUCOSE, CAPILLARY
GLUCOSE-CAPILLARY: 187 mg/dL — AB (ref 65–99)
GLUCOSE-CAPILLARY: 166 mg/dL — AB (ref 65–99)

## 2017-04-24 LAB — MRSA PCR SCREENING: MRSA by PCR: NEGATIVE

## 2017-04-24 MED ORDER — INSULIN LISPRO 100 UNIT/ML (KWIKPEN): 10.0000 [IU] | PEN_INJECTOR | Freq: Three times a day (TID) | SUBCUTANEOUS | Status: DC | PRN

## 2017-04-24 MED ORDER — PNEUMOCOCCAL VAC POLYVALENT 25 MCG/0.5ML IJ INJ: 0.5000 mL | INJECTION | INTRAMUSCULAR | Status: DC

## 2017-04-24 MED ORDER — HYDROCODONE-ACETAMINOPHEN 10-325 MG PO TABS: 1.0000 | ORAL_TABLET | Freq: Three times a day (TID) | ORAL | Status: AC | PRN

## 2017-04-24 MED ORDER — INSULIN GLARGINE 300 UNIT/ML ~~LOC~~ SOPN: 60.0000 [IU] | PEN_INJECTOR | Freq: Every day | SUBCUTANEOUS | Status: DC

## 2017-04-24 NOTE — Progress Notes (Signed)
Patient ready for discharge to home; discharge instructions given and reviewed; no Rx's given by MD; patient discharged out via wheelchair; accompanied home by her son.

## 2017-04-24 NOTE — Discharge Summary (Signed)
Physician Discharge Summary  KIANNAH GRUNOW XWR:[id number] DOB: 10-10-1945 DOA: 04/22/2017  PCP: [name], MD  Admit date: 04/22/2017 Discharge date: 04/24/2017  Admitted From: Home Disposition:  Home  Recommendations for Outpatient Follow-up:  1. Follow up with PCP in 1 week 2. Follow-up with cardiology for outpatient Myoview stress test as scheduled next week 3. Stop amlodipine for now as per cardiology recommendations   Home Health: No Equipment/Devices: None  Discharge Condition: Stable CODE STATUS: Full Diet recommendation: Heart Healthy / Carb Modified   Brief/Interim Summary: 72 year old female with history of peripheral vascular disease status post femoropopliteal bypass, chronic kidney disease stage III, hypertension, hyperlipidemia, diabetes mellitus, anemia, hypothyroidism who had recently visited cardiology office with complaints of chest pain and palpitation and has outpatient stress test scheduled on April 29, 2017 presented to the ER with complaints of weakness.  CT head followed by MRI of the brain did not show anything acute.  VQ scan was low probability for PE.  Cardiology evaluated the patient and recommended outpatient stress test next week.  Patient feels better and will be discharged home.  Discharge Diagnoses:  Principal Problem:   Dizziness Active Problems:   Hypothyroidism   Insulin dependent type 2 diabetes mellitus, controlled (HCC)   Essential hypertension   COPD (chronic obstructive pulmonary disease) (HCC)   CAD (coronary artery disease)   PVD (peripheral vascular disease) (HCC)  Dizziness/weakness -Hypotensive on arrival likely due to the patient's weakness -Resolved. -Feels better.  Hold amlodipine on discharge, resume metoprolol as per cardiology recommendations -D-dimer was elevated: VQ scan was low probability for PE  Chest pain - cardiology consulted: Recommended to keep the appointment for next week for Myoview stress test.  Currently  chest pain-free  Hypothyroidism -continue levothyroxine  Hypertension -stop amlodipine.  Resume metoprolol.  Outpatient follow-up  Chronic kidney disease stage III -Creatinine stable.  Outpatient follow-up.  Stop ibuprofen.  Outpatient follow-up  Diabetes mellitus type 2 -continue home regimen except will hold metformin.  Peripheral vascular disease -Continue antiplatelet agents and statin  COPD -No wheezing  Tobacco abuse -Patient seems determined to quit.  Outpatient follow-up  Discharge Instructions  Discharge Instructions    Call MD for:  difficulty breathing, headache or visual disturbances   Complete by:  As directed    Call MD for:  extreme fatigue   Complete by:  As directed    Call MD for:  hives   Complete by:  As directed    Call MD for:  persistant dizziness or light-headedness   Complete by:  As directed    Call MD for:  persistant nausea and vomiting   Complete by:  As directed    Call MD for:  severe uncontrolled pain   Complete by:  As directed    Call MD for:  temperature >100.4   Complete by:  As directed    Diet - low sodium heart healthy   Complete by:  As directed    Diet Carb Modified   Complete by:  As directed    Increase activity slowly   Complete by:  As directed      Allergies as of 04/24/2017   No Known Allergies     Medication List    STOP taking these medications   amLODipine 10 MG tablet Commonly known as:  NORVASC   clindamycin 150 MG capsule Commonly known as:  CLEOCIN   ibuprofen 200 MG tablet Commonly known as:  ADVIL,MOTRIN   LORazepam 1 MG tablet Commonly known as:  ATIVAN   metFORMIN 500 MG 24 hr tablet Commonly known as:  GLUCOPHAGE-XR   nicotine 21 mg/24hr patch Commonly known as:  NICODERM CQ     TAKE these medications   ACCU-CHEK FASTCLIX LANCETS Misc Use 4 per day to check blood sugar dx code E11.9   clopidogrel 75 MG tablet Commonly known as:  PLAVIX Take 75 mg by mouth daily.   DULoxetine  60 MG capsule Commonly known as:  CYMBALTA TAKE ONE CAPSULE PPO EVERY DAY   ferrous sulfate 325 (65 FE) MG tablet Take 325 mg by mouth daily with breakfast.   HYDROcodone-acetaminophen 10-325 MG tablet Commonly known as:  NORCO Take 1 tablet by mouth 3 (three) times daily as needed (for pain). Follow up with PCP if refills needed What changed:  additional instructions   Insulin Glargine 300 UNIT/ML Sopn Commonly known as:  TOUJEO SOLOSTAR Inject 60 Units into the skin daily.   insulin lispro 100 UNIT/ML KiwkPen Commonly known as:  HUMALOG KWIKPEN Inject 0.1 mLs (10 Units total) into the skin 3 (three) times daily as needed (blood sugar).   Insulin Pen Needle 32G X 4 MM Misc Commonly known as:  BD PEN NEEDLE NANO U/F Use to inject insulin   lansoprazole 30 MG capsule Commonly known as:  PREVACID Take 1 capsule (30 mg total) by mouth daily.   levothyroxine 175 MCG tablet Commonly known as:  SYNTHROID, LEVOTHROID Take 175 mcg by mouth daily.   metoprolol succinate 25 MG 24 hr tablet Commonly known as:  TOPROL XL Take 1 tablet (25 mg total) by mouth daily.   nortriptyline 75 MG capsule Commonly known as:  PAMELOR Take 75 mg by mouth at bedtime.   potassium chloride 10 MEQ tablet Commonly known as:  KLOR-CON 10 Take 1 tablet (10 mEq total) by mouth daily.   pravastatin 20 MG tablet Commonly known as:  PRAVACHOL Take 20 mg by mouth daily.   QUEtiapine 100 MG tablet Commonly known as:  SEROQUEL Take 100 mg by mouth at bedtime.   tolterodine 4 MG 24 hr capsule Commonly known as:  DETROL LA Take 4 mg by mouth daily.   Vitamin D (Ergocalciferol) 50000 units Caps capsule Commonly known as:  DRISDOL Take 50,000 Units by mouth once a week.       Follow-up Information    [name], MD. Schedule an appointment as soon as possible for a visit in 1 week(s).   Specialty:  Internal Medicine Contact information: [address] [place]  [address] [phone]        Josue Hector, MD .   Specialty:  Cardiology Contact information: [phone] [address] [place] [address] [phone]          No Known Allergies  Consultations:  Cardiology   Procedures/Studies: Dg Chest 2 View  Result Date: 04/23/2017 CLINICAL DATA:  Dyspnea with dry cough and chest tightness EXAM: CHEST  2 VIEW COMPARISON:  10/30/2016, 05/10/2016, 09/02/2015 FINDINGS: No focal pulmonary opacity. Mild linear perihilar opacity possibly due to scarring. No pleural effusion. Borderline to mild cardiomegaly with aortic atherosclerosis. No pneumothorax. Mild degenerative changes of the spine. Surgical clips in the right upper quadrant IMPRESSION: No active cardiopulmonary disease. Probable areas of perihilar scarring. Borderline to mild cardiomegaly. Electronically Signed   By: Donavan Foil M.D.   On: 04/23/2017 14:06   Ct Head Wo Contrast  Result Date: 04/22/2017 CLINICAL DATA:  Generalized weakness with headache since this afternoon. EXAM: CT HEAD  WITHOUT CONTRAST TECHNIQUE: Contiguous axial images were obtained from the base of the skull through the vertex without intravenous contrast. COMPARISON:  10/30/2016 FINDINGS: Brain: No evidence of acute infarction, hemorrhage, hydrocephalus, extra-axial collection or mass lesion/mass effect. Minimal chronic ischemic microvascular disease. Vascular: No hyperdense vessel or unexpected calcification. Skull: Normal. Negative for fracture or focal lesion. Sinuses/Orbits: Orbits are normal. 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 Sinuses are otherwise clear. Mastoid air cells are clear. Other: None. IMPRESSION: No acute findings. Mild chronic ischemic microvascular disease. Electronically Signed   By: Marin Olp M.D.   On: 04/22/2017 21:53   Mr Brain Wo Contrast  Result Date: 04/23/2017 CLINICAL DATA:  72 y/o F; generalized weakness,  headache, shortness of breath. History of stroke and heart attack. EXAM: MRI HEAD WITHOUT CONTRAST TECHNIQUE: Multiplanar, multiecho pulse sequences of the brain and surrounding structures were obtained without intravenous contrast. COMPARISON:  04/22/2017 CT of the head.  09/01/2016 MRI of the head. FINDINGS: Brain: No acute infarction, hemorrhage, hydrocephalus, extra-axial collection or mass lesion. Severalnonspecific foci of T2 FLAIR hyperintense signal abnormality in subcortical and periventricular white matter are compatible withmild to moderatechronic microvascular ischemic changes for age. Mild to moderatebrain parenchymal volume loss. Vascular: Normal flow voids. Skull and upper cervical spine: Normal marrow signal. Sinuses/Orbits: Right-sided ethmoidectomy and maxillary antrostomy. Mild mucosal thickening of the ethmoid cavity and right maxillary sinus. Bilateral intra-ocular lens replacement. Otherwise negative. Other: None. IMPRESSION: 1. No acute intracranial abnormality. 2. Stable mild-to-moderate chronic microvascular ischemic changes and parenchymal volume loss of the brain. Electronically Signed   By: Kristine Garbe M.D.   On: 04/23/2017 00:18   Nm Pulmonary Perf And Vent  Result Date: 04/23/2017 CLINICAL DATA:  Elevated D dimer. Hx peripheral vascular disease status post femoral-popliteal bypass, chronic kidney disease stage III, hypertension, hyperlipidemia, diabetes mellitus who presents to the hospital with chest pain/palpitation as well as generalized weakness. Her chest pain was yesterday, has not had any since. EXAM: NUCLEAR MEDICINE VENTILATION - PERFUSION LUNG SCAN TECHNIQUE: Ventilation images were obtained in multiple projections using inhaled aerosol Tc-105m DTPA. Perfusion images were obtained in multiple projections after intravenous injection of Tc-59m-MAA. RADIOPHARMACEUTICALS:  31 mCi of Tc-30m DTPA aerosol inhalation and 4.2 mCi Tc38m-MAA IV COMPARISON:  CT 05/10/2016  and previous FINDINGS: Ventilation: Decreased activity in both lung apices. Perfusion: No wedge shaped peripheral perfusion defects to suggest acute pulmonary embolism. IMPRESSION: Low likelihood ratio for pulmonary embolism. Electronically Signed   By: Lucrezia Europe M.D.   On: 04/23/2017 14:54      Subjective: Patient seen and examined at bedside.  She feels better.  No overnight fever, nausea or vomiting.  Discharge Exam: Vitals:   04/24/17 0626 04/24/17 0809  BP: 115/76 111/64  Pulse: 72 (!) 57  Resp:  18  Temp: 98.8 F (37.1 C) 98.2 F (36.8 C)  SpO2: 95% 99%   Vitals:   04/23/17 2026 04/23/17 2359 04/24/17 0626 04/24/17 0809  BP: (!) 149/65 (!) 127/52 115/76 111/64  Pulse: 70 70 72 (!) 57  Resp: 18 16  18   Temp: 98.6 F (37 C) 98.7 F (37.1 C) 98.8 F (37.1 C) 98.2 F (36.8 C)  TempSrc: Oral Oral Oral Oral  SpO2: 97% 93% 95% 99%  Weight:      Height:        General: Pt is alert, awake, not in acute distress Cardiovascular: Rate controlled, S1/S2 + Respiratory: Bilateral decreased breath sounds at bases Abdominal:  Soft, NT, ND, bowel sounds + Extremities: no edema, no cyanosis    The results of significant diagnostics from this hospitalization (including imaging, microbiology, ancillary and laboratory) are listed below for reference.     Microbiology: Recent Results (from the past 240 hour(s))  MRSA PCR Screening     Status: None   Collection Time: 04/24/17  4:30 AM  Result Value Ref Range Status   MRSA by PCR NEGATIVE NEGATIVE Final    Comment:        The GeneXpert MRSA Assay (FDA approved for NASAL specimens only), is one component of a comprehensive MRSA colonization surveillance program. It is not intended to diagnose MRSA infection nor to guide or monitor treatment for MRSA infections. Performed at [facility], [place] [address], [place], [place] [address]      Labs: BNP (last 3 results) Recent Labs    10/12/16 1832  BNP 32.9   Basic  Metabolic Panel: Recent Labs  Lab 04/20/17 1047 04/22/17 2106 04/22/17 2121 04/23/17 0437  NA 138 135 139 137  K 4.6 4.2 4.2 4.0  CL 100 105 106 106  CO2 20 19*  --  18*  GLUCOSE 275* 160* 154* 191*  BUN 17 26* 29* 26*  CREATININE 1.45* 1.85* 1.80* 1.68*  CALCIUM 9.6 8.6*  --  8.9   Liver Function Tests: Recent Labs  Lab 04/20/17 1047 04/22/17 2106 04/23/17 0437  AST 8 18 15   ALT 9 12* 12*  ALKPHOS 120* 92 88  BILITOT 0.2 0.6 0.3  PROT 7.8 6.6 6.6  ALBUMIN 4.4 3.5 3.4*   No results for input(s): LIPASE, AMYLASE in the last 168 hours. No results for input(s): AMMONIA in the last 168 hours. CBC: Recent Labs  Lab 04/20/17 1047 04/22/17 2106 04/22/17 2121 04/23/17 0437  WBC 7.6 8.8  --  9.0  NEUTROABS 5.1 5.2  --  5.5  HGB 11.9 10.0* 9.9* 10.5*  HCT 34.9 30.2* 29.0* 31.5*  MCV 89 89.1  --  89.2  PLT 350 310  --  296   Cardiac Enzymes: Recent Labs  Lab 04/23/17 0437 04/23/17 0709 04/23/17 0946 04/23/17 1522  CKTOTAL  --  30*  --   --   TROPONINI <0.03  --  <0.03 <0.03   BNP: Invalid input(s): POCBNP CBG: Recent Labs  Lab 04/23/17 0601 04/23/17 1125 04/23/17 1724 04/23/17 2137 04/24/17 0624  GLUCAP 207* 243* 251* 259* 187*   D-Dimer Recent Labs    04/23/17 0709  DDIMER 1.19*   Hgb A1c No results for input(s): HGBA1C in the last 72 hours. Lipid Profile No results for input(s): CHOL, HDL, LDLCALC, TRIG, CHOLHDL, LDLDIRECT in the last 72 hours. Thyroid function studies Recent Labs    04/23/17 0437  TSH 9.596*   Anemia work up No results for input(s): VITAMINB12, FOLATE, FERRITIN, TIBC, IRON, RETICCTPCT in the last 72 hours. Urinalysis    Component Value Date/Time   COLORURINE YELLOW 04/23/2017 0035   APPEARANCEUR HAZY (A) 04/23/2017 0035   LABSPEC 1.008 04/23/2017 0035   PHURINE 5.0 04/23/2017 0035   GLUCOSEU 50 (A) 04/23/2017 0035   GLUCOSEU >=1000 (A) 12/13/2015 1353   HGBUR NEGATIVE 04/23/2017 0035   BILIRUBINUR NEGATIVE  04/23/2017 0035   KETONESUR NEGATIVE 04/23/2017 0035   PROTEINUR NEGATIVE 04/23/2017 0035   UROBILINOGEN 0.2 12/13/2015 1353   NITRITE NEGATIVE 04/23/2017 0035   LEUKOCYTESUR NEGATIVE 04/23/2017 0035   Sepsis Labs Invalid input(s): PROCALCITONIN,  WBC,  LACTICIDVEN Microbiology Recent Results (from the past  240 hour(s))  MRSA PCR Screening     Status: None   Collection Time: 04/24/17  4:30 AM  Result Value Ref Range Status   MRSA by PCR NEGATIVE NEGATIVE Final    Comment:        The GeneXpert MRSA Assay (FDA approved for NASAL specimens only), is one component of a comprehensive MRSA colonization surveillance program. It is not intended to diagnose MRSA infection nor to guide or monitor treatment for MRSA infections. Performed at [facility], [place] [address], [place], [place] [address]      Time coordinating discharge: 35 minutes  SIGNED:   Aline August, MD  [name] 04/24/2017, 9:27 AM Pager: [phone]  If 7PM-7AM, please contact night-coverage [email or web address] Password [email or web address]

## 2017-04-24 NOTE — Care Management Note (Signed)
Case Management Note  Patient Details  Name: April Branch MRN: [id number] Date of Birth: Jul 19, 1945  Subjective/Objective:           Pt presents for dizziness and weakness from home with son.  Pt uses RW, cane, and 3n1 at home.  Pt has recently used [facility] and RN and would like to resume PT.  Pt's son works during day but states she has friends and neighbors that are available to her during the day or stay with her.  She does not feel she needs any other equipment.   Action/Plan: Requested order from MD for HH/F2F.  Sharyn Lull with [facility] contacted for referral.   Expected Discharge Date:  04/24/17               Expected Discharge Plan:  [place]  In-House Referral:  NA  Discharge planning Services  CM Consult  Post Acute Care Choice:  Home Health Choice offered to:  Patient  DME Arranged:  N/A DME Agency:  NA  HH Arranged:  PT HH Agency:  [facility]  Status of Service:  Completed, signed off  If discussed at [place] of Stay Meetings, dates discussed:    Additional Comments:  Claudie Leach, RN 04/24/2017, 10:30 AM

## 2017-04-26 ENCOUNTER — Telehealth (HOSPITAL_COMMUNITY): Payer: Self-pay | Admitting: *Deleted

## 2017-04-26 NOTE — Telephone Encounter (Signed)
Left message on voicemail per DPR in reference to upcoming appointment scheduled on 04/29/17 with detailed instructions given per Myocardial Perfusion Study Information Sheet for the test. LM to arrive 15 minutes early, and that it is imperative to arrive on time for appointment to keep from having the test rescheduled. If you need to cancel or reschedule your appointment, please call the office within 24 hours of your appointment. Failure to do so may result in a cancellation of your appointment, and a $50 no show fee. Phone number given for call back for any questions. Kirstie Peri

## 2017-04-28 ENCOUNTER — Ambulatory Visit: Payer: Medicare Other | Admitting: Cardiovascular Disease

## 2017-04-29 ENCOUNTER — Ambulatory Visit (INDEPENDENT_AMBULATORY_CARE_PROVIDER_SITE_OTHER): Payer: Medicare Other

## 2017-04-29 ENCOUNTER — Ambulatory Visit (HOSPITAL_COMMUNITY): Payer: Medicare Other | Attending: Cardiology

## 2017-04-29 ENCOUNTER — Other Ambulatory Visit: Payer: Self-pay | Admitting: Physician Assistant

## 2017-04-29 DIAGNOSIS — N183 Chronic kidney disease, stage 3 unspecified: Secondary | ICD-10-CM

## 2017-04-29 DIAGNOSIS — I129 Hypertensive chronic kidney disease with stage 1 through stage 4 chronic kidney disease, or unspecified chronic kidney disease: Secondary | ICD-10-CM | POA: Diagnosis not present

## 2017-04-29 DIAGNOSIS — I10 Essential (primary) hypertension: Secondary | ICD-10-CM | POA: Diagnosis not present

## 2017-04-29 DIAGNOSIS — R002 Palpitations: Secondary | ICD-10-CM

## 2017-04-29 DIAGNOSIS — F17200 Nicotine dependence, unspecified, uncomplicated: Secondary | ICD-10-CM

## 2017-04-29 DIAGNOSIS — I739 Peripheral vascular disease, unspecified: Secondary | ICD-10-CM

## 2017-04-29 DIAGNOSIS — R079 Chest pain, unspecified: Secondary | ICD-10-CM | POA: Diagnosis not present

## 2017-04-29 DIAGNOSIS — I2510 Atherosclerotic heart disease of native coronary artery without angina pectoris: Secondary | ICD-10-CM | POA: Diagnosis not present

## 2017-04-29 DIAGNOSIS — I2583 Coronary atherosclerosis due to lipid rich plaque: Secondary | ICD-10-CM | POA: Diagnosis not present

## 2017-04-29 LAB — MYOCARDIAL PERFUSION IMAGING
CHL CUP NUCLEAR SRS: 2
CHL CUP RESTING HR STRESS: 63 {beats}/min
LV dias vol: 70 mL (ref 46–106)
LVSYSVOL: 24 mL
Peak HR: 79 {beats}/min
RATE: 0.31
SDS: 3
SSS: 5
TID: 0.9

## 2017-04-29 MED ORDER — REGADENOSON 0.4 MG/5ML IV SOLN
0.4000 mg | Freq: Once | INTRAVENOUS | Status: AC
  Administered 2017-04-29: 0.4 mg via INTRAVENOUS

## 2017-04-29 MED ORDER — TECHNETIUM TC 99M TETROFOSMIN IV KIT
30.7000 | PACK | Freq: Once | INTRAVENOUS | Status: AC | PRN
  Administered 2017-04-29: 30.7 via INTRAVENOUS
  Filled 2017-04-29: qty 31

## 2017-04-29 MED ORDER — TECHNETIUM TC 99M TETROFOSMIN IV KIT
10.0000 | PACK | Freq: Once | INTRAVENOUS | Status: AC | PRN
  Administered 2017-04-29: 10 via INTRAVENOUS
  Filled 2017-04-29: qty 10

## 2017-05-02 NOTE — Progress Notes (Signed)
Cardiology Office Note    Date:  05/04/2017   ID:  April Branch, April Branch [birth year], MRN [id number]  PCP:  April Jewel, MD  Cardiologist: [name], MD  No chief complaint on file.   History of Present Illness:   72 y.o. with atypical chest pain. Normal cath 2003 and 2013. Seen by PA 04/20/17 chest pain f/u myovue 04/29/17 Normal. Palpitations with PaC;s put back on Toprol. Event monitor pending PVD sees [facility]. Previous left femoropopliteal bypass x 2. Bradycardia in setting of severe hypothyroidism after stopping meds August 2018 Carotid 03/30/17 bilateral 40-59% ICA stenosis .   Having spells of decreased alertness and headache  Event monitor pending    Past Medical History:  Diagnosis Date  . Anemia   . Anxiety   . Arthritis   . CAD (coronary artery disease) 2016   non-obstructive at cath  . Colon polyps 09/11/2010   Tubular adenoma  . COPD 12/14/2008   PATIENT DENIES    . Depression   . Diabetic coma ([place]) Feb. 2014  . Fall at home 2016   x 2 in January 2016, Left knee  . Fibromyalgia   . GERD 07/20/2006  . Headache(784.0)   . History of transient ischemic attack (TIA)   . HPV (human papilloma virus) infection   . Hyperlipidemia   . Hypertension   . Hypothyroidism   . Insulin dependent type 2 diabetes mellitus, controlled ([place]) 1989  . Lumbar disc disease   . Neuromuscular disorder ([place])    PERIPHERAL NEUROPATHY  . Peripheral vascular disease ([place])   . Personal history of malignant neoplasm of kidney(V10.52) 12/14/2008   Laparoscopic biopsy and cryoablation 7/08 Dr. Vernie Shanks   . Renal disorder    chronic kidney disease   . Stroke [facility])    3 MINI STROKES  RT SIDED WEAKNESS  . Vertigo     Past Surgical History:  Procedure Laterality Date  . ABDOMINAL AORTAGRAM N/A 08/21/2011   Procedure: ABDOMINAL Maxcine Ham;  Surgeon: Elam Dutch, MD;  Location: [facility] CATH LAB;  Service: Cardiovascular;  Laterality: N/A;  . ABDOMINAL AORTAGRAM N/A 03/16/2014   Procedure: ABDOMINAL Maxcine Ham;  Surgeon: Elam Dutch, MD;  Location: [facility] CATH LAB;  Service: Cardiovascular;  Laterality: N/A;  . ABDOMINAL HYSTERECTOMY    . APPENDECTOMY    . BLADDER SUSPENSION    . CARDIAC CATHETERIZATION    . CHOLECYSTECTOMY OPEN    . ESOPHAGOGASTRODUODENOSCOPY (EGD) WITH PROPOFOL N/A 05/16/2014   Procedure: ESOPHAGOGASTRODUODENOSCOPY (EGD) WITH PROPOFOL with Balloon dilation;  Surgeon: Milus Banister, MD;  Location: [place];  Service: Endoscopy;  Laterality: N/A;  . ESOPHAGOGASTRODUODENOSCOPY (EGD) WITH PROPOFOL N/A 09/02/2015   Procedure: ESOPHAGOGASTRODUODENOSCOPY (EGD) WITH PROPOFOL;  Surgeon: Doran Stabler, MD;  Location: [place];  Service: Gastroenterology;  Laterality: N/A;  . FEMORAL-POPLITEAL BYPASS GRAFT  10/12/2011   Procedure: BYPASS GRAFT FEMORAL-POPLITEAL ARTERY;  Surgeon: Elam Dutch, MD;  Location: [facility] OR;  Service: Vascular;  Laterality: Left;  Left Femoral-Popliteal Bypass Graft using 51mm x 80cm Propaten Graft with intraop arteriogram times one.  . FEMORAL-POPLITEAL BYPASS GRAFT Left 04/17/2014   Procedure: REDO LEFT FEMORAL-POPLITEAL ARTERY BYPASS USING GORE PROPATEN 32mmx80cm GRAFT;  Surgeon: Elam Dutch, MD;  Location: [place];  Service: Vascular;  Laterality: Left;  . FOOT SURGERY Left    "bone spurs"  . INTRAOPERATIVE ARTERIOGRAM  10/12/2011   Procedure: INTRA OPERATIVE ARTERIOGRAM;  Surgeon: Elam Dutch, MD;  Location: [place];  Service: Vascular;  Laterality: Left;  .  KNEE ARTHROSCOPY Bilateral    "cartilage"  . Laparascopic cryoablation of left kidney  08/2006   Dr. Gladis Riffle for renal cell cancer  . LEFT HEART CATHETERIZATION WITH CORONARY ANGIOGRAM N/A 05/11/2011   Procedure: LEFT HEART CATHETERIZATION WITH CORONARY ANGIOGRAM;  Surgeon: Jolaine Artist, MD;  Location[facility] C[facility] LAB;  Service: Cardiovascular;  Laterality: N/A;  . LUNG SURGERY Right    lung nodule removed from the right side  . PR VEIN BYPASS  GRAFT,AORTO-FEM-POP  05/27/2010  . SHOULDER ARTHROSCOPY Bilateral    'Spurs"  . TUBAL LIGATION      Current Medications: Current Meds  Medication Sig  . ACCU-CHEK FASTCLIX LANCETS MISC Use 4 per day to check blood sugar dx code E11.9  . clopidogrel (PLAVIX) 75 MG tablet Take 75 mg by mouth daily.  . DULoxetine (CYMBALTA) 60 MG capsule TAKE ONE CAPSULE PPO EVERY DAY  . ferrous sulfate 325 (65 FE) MG tablet Take 325 mg by mouth daily with breakfast.   . HYDROcodone-acetaminophen (NORCO) 10-325 MG tablet Take 1 tablet by mouth 3 (three) times daily as needed (for pain). Follow up with PCP if refills needed  . Insulin Glargine (TOUJEO SOLOSTAR) 300 UNIT/ML SOPN Inject 60 Units into the skin daily.  . insulin lispro (HUMALOG KWIKPEN) 100 UNIT/ML KiwkPen Inject 0.1 mLs (10 Units total) into the skin 3 (three) times daily as needed (blood sugar).  . Insulin Pen Needle (BD PEN NEEDLE NANO U/F) 32G X 4 MM MISC Use to inject insulin  . lansoprazole (PREVACID) 30 MG capsule Take 1 capsule (30 mg total) by mouth daily.  Marland Kitchen levothyroxine (SYNTHROID, LEVOTHROID) 175 MCG tablet Take 175 mcg by mouth daily.  . metoprolol succinate (TOPROL XL) 25 MG 24 hr tablet Take 1 tablet (25 mg total) by mouth daily.  . nortriptyline (PAMELOR) 75 MG capsule Take 75 mg by mouth at bedtime.  . potassium chloride (KLOR-CON 10) 10 MEQ tablet Take 1 tablet (10 mEq total) by mouth daily.  . pravastatin (PRAVACHOL) 20 MG tablet Take 20 mg by mouth daily.  . QUEtiapine (SEROQUEL) 100 MG tablet Take 100 mg by mouth at bedtime.  . tolterodine (DETROL LA) 4 MG 24 hr capsule Take 4 mg by mouth daily.   . Vitamin D, Ergocalciferol, (DRISDOL) 50000 units CAPS capsule Take 50,000 Units by mouth once a week.     Allergies:   Patient has no known allergies.   Social History   Socioeconomic History  . Marital status: Widowed    Spouse name: None  . Number of children: 2  . Years of education: 8th grade  . Highest education  level: None  Social Needs  . Financial resource strain: None  . Food insecurity - worry: None  . Food insecurity - inability: None  . Transportation needs - medical: None  . Transportation needs - non-medical: None  Occupational History  . 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 Smoking status: Current Every Day Smoker    Packs/day: 1.50    Years: 50.00    Pack years: 75.00    Types: Cigarettes    Last attempt to quit: 06/24/2014    Years since quitting: 2.8  . Smokeless tobacco: Never Used  Substance and Sexual Activity  . Alcohol use: No    Alcohol/week: 0.0 oz  . Drug use: No  . Sexual activity: No  Other Topics Concern  . None  Social History Narrative   Widow. Two sons. Husband died  of lung cancer.     Right-handed.   She lives at home with her friend, [name] and her son.   2-4 bottles of Coke per day.           Family History:  The patient's family history includes Cancer in her father and mother; Heart disease in her father, mother, and sister; Lung cancer in her father and mother.   ROS:   Please see the history of present illness.    Review of Systems  Constitution: Negative.  HENT: Negative.   Eyes: Negative.   Cardiovascular: Positive for chest pain, claudication and dyspnea on exertion.  Respiratory: Negative.   Hematologic/Lymphatic: Negative.   Musculoskeletal: Negative.  Negative for joint pain.  Gastrointestinal: Negative.   Genitourinary: Negative.   Neurological: Negative.   Psychiatric/Behavioral: Positive for depression. The patient is nervous/anxious.    All other systems reviewed and are negative.   PHYSICAL EXAM:   VS:  BP 126/70   Pulse 70   Ht 5\' 1"  (1.549 m)   Wt 156 lb 12.8 oz (71.1 kg)   BMI 29.63 kg/m   Physical Exam  Affect appropriate Healthy:  appears stated age HEENT: normal Neck supple with no adenopathy JVP normal no bruits no thyromegaly Lungs clear with no wheezing and good diaphragmatic  motion Heart:  S1/S2 no murmur, no rub, gallop or click PMI normal Abdomen: benighn, BS positve, no tenderness, no AAA Previous left femoropopliteal bypass x 2  No edema Neuro non-focal Skin warm and dry No muscular weakness   Wt Readings from Last 3 Encounters:  05/04/17 156 lb 12.8 oz (71.1 kg)  04/29/17 156 lb (70.8 kg)  04/23/17 156 lb 12 oz (71.1 kg)      Studies/Labs Reviewed:   EKG:  04/22/17 SR rate 61 low voltage PAC;s [id number]   Recent Labs: 10/12/2016: B Natriuretic Peptide 53.4 10/31/2016: Magnesium 1.3 04/23/2017: ALT 12; BUN 26; Creatinine, Ser 1.68; Hemoglobin 10.5; Platelets 296; Potassium 4.0; Sodium 137; TSH 9.596   Lipid Panel    Component Value Date/Time   CHOL 219 (H) 04/20/2017 1047   TRIG 421 (H) 04/20/2017 1047   HDL 48 04/20/2017 1047   CHOLHDL 4.6 (H) 04/20/2017 1047   CHOLHDL 4.3 10/14/2016 0619   VLDL 34 10/14/2016 0619   LDLCALC Comment 04/20/2017 1047   LDLDIRECT 82.0 08/02/2015 1049    Additional studies/ records that were reviewed today include: 2D echo 10/31/16 Study Conclusions   - Left ventricle: The cavity size was normal. Systolic function was   normal. The estimated ejection fraction was in the range of 60%   to 65%. Wall motion was normal; there were no regional wall   motion abnormalities. Mild focal basal septal hypertrophy.   Diastolic dysfunction, grade indeterminate. Elevated filling   pressures. - Mitral valve: There was mild regurgitation. - Left atrium: The atrium was mildly dilated. - Right ventricle: Systolic function was reduced. - Atrial septum: There was increased thickness of the septum,   consistent with lipomatous hypertrophy. - Pericardium, extracardiac: A trivial pericardial effusion was   identified.  Myovue : 04/29/17 normal no ischemia EF 65%    Lower extremity ABIs 03/30/17 Final Interpretation: Right: Resting right ankle-brachial index indicates moderate right lower extremity arterial disease. The right  toe-brachial index is abnormal. Decreased ankle-brachial index since prior exam of 03/27/2016 Left: Resting left ankle-brachial index indicates moderate left lower extremity arterial disease. The left toe-brachial index is abnormal.   Carotid Dopplers 03/30/17 Final Interpretation: Right Carotid: Velocities  in the right ICA are consistent with a 40-59%                stenosis.  Left Carotid: Velocities in the left ICA are consistent with a 40-59% stenosis.     ASSESSMENT:    1. Essential hypertension   2. Coronary artery disease due to lipid rich plaque   3. PVD (peripheral vascular disease) ([place])   4. Pure hypercholesterolemia      PLAN:  In order of problems listed above:   Palpitations   CAD/Chest Pain  nonobstructive on cardiac cath 2003 and again in 2010, normal Lexiscan Myoview 2016 and  Again 04/29/17 suspect non cardiac etiology observe   Essential hypertension Well controlled.  Continue current medications and low sodium Dash type diet.    PVD status post femoropopliteal 2016 with recent ABIs showing progression of disease, 40-59% bilateral carotid stenosis-followed by Dr. Oneida Alar  CKD last creatinine 1.68 04/23/17   Smoking : counseled for less than 10 minutes CXR 04/23/17 NAD V/Q low risk dame date   HLD:  On pravachol   LDL 107 high TG;s low fat diet   Neuro:  Not clear what her "spells" of confusion and decreased alertness are f/u primary carotids not critical Will check event monitor when available

## 2017-05-04 ENCOUNTER — Encounter: Payer: Self-pay | Admitting: Cardiovascular Disease

## 2017-05-04 ENCOUNTER — Ambulatory Visit (INDEPENDENT_AMBULATORY_CARE_PROVIDER_SITE_OTHER): Payer: Medicare Other | Admitting: Cardiovascular Disease

## 2017-05-04 VITALS — BP 126/70 | HR 70 | Ht 61.0 in | Wt 156.8 lb

## 2017-05-04 DIAGNOSIS — I739 Peripheral vascular disease, unspecified: Secondary | ICD-10-CM | POA: Diagnosis not present

## 2017-05-04 DIAGNOSIS — I10 Essential (primary) hypertension: Secondary | ICD-10-CM

## 2017-05-04 DIAGNOSIS — E7800 Pure hypercholesterolemia, unspecified: Secondary | ICD-10-CM | POA: Diagnosis not present

## 2017-05-04 DIAGNOSIS — I2583 Coronary atherosclerosis due to lipid rich plaque: Secondary | ICD-10-CM | POA: Diagnosis not present

## 2017-05-04 DIAGNOSIS — I2510 Atherosclerotic heart disease of native coronary artery without angina pectoris: Secondary | ICD-10-CM | POA: Diagnosis not present

## 2017-05-04 NOTE — Patient Instructions (Addendum)

## 2017-05-12 ENCOUNTER — Telehealth: Payer: Self-pay | Admitting: Cardiovascular Disease

## 2017-05-12 NOTE — Telephone Encounter (Signed)
She shouldn't be passing out from her heart

## 2017-05-12 NOTE — Telephone Encounter (Signed)
Called patient back about her monitor results. 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 Patient stated she had another one of her "spells" on Friday night. Patient stated she just black out for 5 minutes in her recliner. After reviewing Dr. Johnsie Cancel last office visit note from 05/04/17, informed patient to f/u with her PCP.  Informed patient that a message would be sent to Dr. Johnsie Cancel as well for further advisement.

## 2017-05-12 NOTE — Telephone Encounter (Signed)
New Message:    Pt needs to know the results from the heart monitor and which medicines she is supposed to keep taking. She spoke with some last week and she can't remember what they told her before.

## 2017-05-13 NOTE — Telephone Encounter (Signed)
Left message for patient to call back  

## 2017-05-20 NOTE — Telephone Encounter (Signed)
Called patient back and informed her of Dr. Kyla Balzarine advisement. Patient verbalized understanding.

## 2017-05-29 ENCOUNTER — Other Ambulatory Visit: Payer: Self-pay | Admitting: Endocrinology

## 2017-07-01 ENCOUNTER — Other Ambulatory Visit: Payer: Self-pay | Admitting: Endocrinology

## 2017-08-12 ENCOUNTER — Telehealth: Payer: Self-pay | Admitting: Endocrinology

## 2017-08-13 NOTE — Telephone Encounter (Signed)
Ok to refill? Pt has not been seen since 2017.

## 2017-08-16 NOTE — Telephone Encounter (Signed)
She needs to be seen for any prescriptions to be filled 

## 2017-08-16 NOTE — Telephone Encounter (Signed)
Pt called and notified of MD message. Pt verbalized understanding and was then transferred to front desk for scheduling.

## 2017-08-17 ENCOUNTER — Encounter: Payer: Self-pay | Admitting: Endocrinology

## 2017-08-17 ENCOUNTER — Ambulatory Visit (INDEPENDENT_AMBULATORY_CARE_PROVIDER_SITE_OTHER): Payer: Medicare Other | Admitting: Endocrinology

## 2017-08-17 VITALS — BP 100/40 | HR 71 | Ht 61.0 in | Wt 157.6 lb

## 2017-08-17 DIAGNOSIS — Z794 Long term (current) use of insulin: Secondary | ICD-10-CM

## 2017-08-17 DIAGNOSIS — E1165 Type 2 diabetes mellitus with hyperglycemia: Secondary | ICD-10-CM

## 2017-08-17 DIAGNOSIS — I951 Orthostatic hypotension: Secondary | ICD-10-CM | POA: Diagnosis not present

## 2017-08-17 LAB — COMPREHENSIVE METABOLIC PANEL
ALK PHOS: 90 U/L (ref 39–117)
ALT: 9 U/L (ref 0–35)
AST: 9 U/L (ref 0–37)
Albumin: 3.6 g/dL (ref 3.5–5.2)
BILIRUBIN TOTAL: 0.2 mg/dL (ref 0.2–1.2)
BUN: 19 mg/dL (ref 6–23)
CALCIUM: 8.5 mg/dL (ref 8.4–10.5)
CO2: 23 mEq/L (ref 19–32)
Chloride: 103 mEq/L (ref 96–112)
Creatinine, Ser: 1.83 mg/dL — ABNORMAL HIGH (ref 0.40–1.20)
GFR: 29 mL/min — AB (ref >60.00)
GLUCOSE: 267 mg/dL — AB (ref 70–99)
POTASSIUM: 3.3 meq/L — AB (ref 3.5–5.1)
Sodium: 136 mEq/L (ref 135–145)
TOTAL PROTEIN: 6.8 g/dL (ref 6.0–8.3)

## 2017-08-17 LAB — URINALYSIS, ROUTINE W REFLEX MICROSCOPIC
BILIRUBIN URINE: NEGATIVE
KETONES UR: NEGATIVE
LEUKOCYTES UA: NEGATIVE
NITRITE: NEGATIVE
PH: 5.5 (ref 5.0–8.0)
Specific Gravity, Urine: >=1.03 — AB (ref 1.000–1.030)
URINE GLUCOSE: 250 — AB
UROBILINOGEN UA: 0.2 (ref 0.0–1.0)

## 2017-08-17 LAB — MICROALBUMIN / CREATININE URINE RATIO
Creatinine,U: 181.9 mg/dL
Microalb Creat Ratio: 1 mg/g (ref 0.0–30.0)
Microalb, Ur: 1.8 mg/dL (ref 0.0–1.9)

## 2017-08-17 LAB — POCT GLYCOSYLATED HEMOGLOBIN (HGB A1C): HEMOGLOBIN A1C: 9.7 % — AB (ref 4.0–5.6)

## 2017-08-17 MED ORDER — INSULIN GLARGINE 300 UNIT/ML ~~LOC~~ SOPN: 60.0000 [IU] | PEN_INJECTOR | Freq: Every day | SUBCUTANEOUS | 0 refills | Status: DC

## 2017-08-17 NOTE — Progress Notes (Signed)
Patient ID: April Branch, female   DOB: February 17, 1946, 72 y.o.   MRN: [id number]           Reason for Appointment: Follow-up  for Type 2 Diabetes  Referring physician: Pattricia Boss  History of Present Illness:          Date of diagnosis of type 2 diabetes mellitus: 1980s       Previous history:    She thinks she was treated with metformin for some time before she went on insulin in ?  2002   She has mostly been treated with Lantus and rapid acting insulin such as Apidra or Novolog.   She thinks that Apidra worked fairly fast but insurance did not cover this, generally has had poor control   On her initial consultation she was switched from Lantus to [facility] because of blood sugars mostly over 300  Was also started on metformin ER 750 mg, 2 tablets daily initially and  in 3/17 she was started on Invokana 100 mg daily because of  poor control  Recent history:   INSULIN regimen is described as:   Toujeo 0 units in am, 10 Humalog before meals bid  She has not been seen in follow-up since 2017  Her A1c has gone up significantly to 9.7 and appears to be persistently high  Current blood sugar patterns and problems identified:  She apparently ran out of her TOUJEO several months ago and did not ask for refill or reschedule her follow-up  She is mostly taking Humalog in the morning and sometimes at bedtime  She takes only 10 units and does not go up on the dose based on her blood sugar also  She thinks she is eating only 2 meals a day  More recently has been drinking a lot of regular soft drinks such as regular Cokes  Although she is not complaining about increased thirst or frequent urination during the daytime her blood sugars are only rarely below 300 She was taking metformin but she ran out about a week ago Also mostly when she stopped taking her Invokana  Oral hypoglycemic drugs the patient is taking are:  Metformin ER, out 1 week Side effects from medications have been:  none  Compliance with the medical regimen: Inconsistent  Hypoglycemia: none  Glucose monitoring:  done  1.4  times a day         Glucometer: Accucheck      Blood Glucose readings by monitor download:  Mean values apply above for all meters except median for One Touch  PRE-MEAL Fasting Lunch Dinner Bedtime Overall  Glucose range:  305-451  217  381  324-539   Mean/median:  370    330   Self-care: The diet that the patient has been following is: tries to limit  High-fat foods.      Typical meal intake: Breakfast is variable at 9 AM, frequently no lunch, dinner 7 pm              Dietician visit, most recent: never               Exercise:  some walking limited by leg pain and dizziness  Weight history:  Highest 199 , has not lost any weight recently  Wt Readings from Last 3 Encounters:  08/17/17 157 lb 9.6 oz (71.5 kg)  05/04/17 156 lb 12.8 oz (71.1 kg)  04/29/17 156 lb (70.8 kg)    Glycemic control:   Lab Results  Component Value Date  HGBA1C 9.7 (A) 08/17/2017   HGBA1C 9.2 (H) 10/31/2016   HGBA1C 10.1 (H) 10/12/2016   Lab Results  Component Value Date   MICROALBUR 1.0 12/13/2015   [place] Comment 04/20/2017   CREATININE 1.68 (H) 04/23/2017    Office Visit on 08/17/2017  Component Date Value Ref Range Status  . Hemoglobin A1C 08/17/2017 9.7* 4.0 - 5.6 % Final    OTHER active problems: See review of systems    Allergies as of 08/17/2017   No Known Allergies     Medication List        Accurate as of 08/17/17  3:25 PM. Always use your most recent med list.          ACCU-CHEK FASTCLIX LANCETS Misc Use 4 per day to check blood sugar dx code E11.9   clopidogrel 75 MG tablet Commonly known as:  PLAVIX Take 75 mg by mouth daily.   DULoxetine 60 MG capsule Commonly known as:  CYMBALTA TAKE ONE CAPSULE PPO EVERY DAY   ferrous sulfate 325 (65 FE) MG tablet Take 325 mg by mouth daily with breakfast.   HYDROcodone-acetaminophen 10-325 MG tablet Commonly  known as:  NORCO Take 1 tablet by mouth 3 (three) times daily as needed (for pain). Follow up with PCP if refills needed   Insulin Glargine 300 UNIT/ML Sopn Commonly known as:  TOUJEO SOLOSTAR Inject 60 Units into the skin daily.   insulin lispro 100 UNIT/ML KiwkPen Commonly known as:  HUMALOG KWIKPEN Inject 0.1 mLs (10 Units total) into the skin 3 (three) times daily as needed (blood sugar).   Insulin Pen Needle 32G X 4 MM Misc Commonly known as:  BD PEN NEEDLE NANO U/F Use to inject insulin   lansoprazole 30 MG capsule Commonly known as:  PREVACID Take 1 capsule (30 mg total) by mouth daily.   levothyroxine 175 MCG tablet Commonly known as:  SYNTHROID, LEVOTHROID Take 175 mcg by mouth daily.   metoprolol succinate 25 MG 24 hr tablet Commonly known as:  TOPROL XL Take 1 tablet (25 mg total) by mouth daily.   nortriptyline 75 MG capsule Commonly known as:  PAMELOR Take 75 mg by mouth at bedtime.   potassium chloride 10 MEQ tablet Commonly known as:  KLOR-CON 10 Take 1 tablet (10 mEq total) by mouth daily.   pravastatin 20 MG tablet Commonly known as:  PRAVACHOL Take 20 mg by mouth daily.   QUEtiapine 100 MG tablet Commonly known as:  SEROQUEL Take 100 mg by mouth at bedtime.   tolterodine 4 MG 24 hr capsule Commonly known as:  DETROL LA Take 4 mg by mouth daily.   Vitamin D (Ergocalciferol) 50000 units Caps capsule Commonly known as:  DRISDOL Take 50,000 Units by mouth once a week.       Allergies: No Known Allergies  Past Medical History:  Diagnosis Date  . Anemia   . Anxiety   . Arthritis   . CAD (coronary artery disease) 2016   non-obstructive at cath  . Colon polyps 09/11/2010   Tubular adenoma  . COPD 12/14/2008   PATIENT DENIES    . Depression   . Diabetic coma ([place]) Feb. 2014  . Fall at home 2016   x 2 in January 2016, Left knee  . Fibromyalgia   . GERD 07/20/2006  . Headache(784.0)   . History of transient ischemic attack (TIA)   . HPV  (human papilloma virus) infection   . Hyperlipidemia   . Hypertension   . Hypothyroidism   .  Insulin dependent type 2 diabetes mellitus, controlled ([place]) 1989  . Lumbar disc disease   . Neuromuscular disorder ([place])    PERIPHERAL NEUROPATHY  . Peripheral vascular disease ([place])   . Personal history of malignant neoplasm of kidney(V10.52) 12/14/2008   Laparoscopic biopsy and cryoablation 7/08 Dr. Vernie Shanks   . Renal disorder    chronic kidney disease   . Stroke [facility])    3 MINI STROKES  RT SIDED WEAKNESS  . Vertigo     Past Surgical History:  Procedure Laterality Date  . ABDOMINAL AORTAGRAM N/A 08/21/2011   Procedure: ABDOMINAL Maxcine Ham;  Surgeon: Elam Dutch, MD;  Location: [facility] CATH LAB;  Service: Cardiovascular;  Laterality: N/A;  . ABDOMINAL AORTAGRAM N/A 03/16/2014   Procedure: ABDOMINAL Maxcine Ham;  Surgeon: Elam Dutch, MD;  Location: [facility] CATH LAB;  Service: Cardiovascular;  Laterality: N/A;  . ABDOMINAL HYSTERECTOMY    . APPENDECTOMY    . BLADDER SUSPENSION    . CARDIAC CATHETERIZATION    . CHOLECYSTECTOMY OPEN    . ESOPHAGOGASTRODUODENOSCOPY (EGD) WITH PROPOFOL N/A 05/16/2014   Procedure: ESOPHAGOGASTRODUODENOSCOPY (EGD) WITH PROPOFOL with Balloon dilation;  Surgeon: Milus Banister, MD;  Location: [place];  Service: Endoscopy;  Laterality: N/A;  . ESOPHAGOGASTRODUODENOSCOPY (EGD) WITH PROPOFOL N/A 09/02/2015   Procedure: ESOPHAGOGASTRODUODENOSCOPY (EGD) WITH PROPOFOL;  Surgeon: Doran Stabler, MD;  Location: [place];  Service: Gastroenterology;  Laterality: N/A;  . FEMORAL-POPLITEAL BYPASS GRAFT  10/12/2011   Procedure: BYPASS GRAFT FEMORAL-POPLITEAL ARTERY;  Surgeon: Elam Dutch, MD;  Location: [facility] OR;  Service: Vascular;  Laterality: Left;  Left Femoral-Popliteal Bypass Graft using 21mm x 80cm Propaten Graft with intraop arteriogram times one.  . FEMORAL-POPLITEAL BYPASS GRAFT Left 04/17/2014   Procedure: REDO LEFT FEMORAL-POPLITEAL ARTERY BYPASS USING GORE  PROPATEN 13mmx80cm GRAFT;  Surgeon: Elam Dutch, MD;  Location: [place];  Service: Vascular;  Laterality: Left;  . FOOT SURGERY Left    "bone spurs"  . INTRAOPERATIVE ARTERIOGRAM  10/12/2011   Procedure: INTRA OPERATIVE ARTERIOGRAM;  Surgeon: Elam Dutch, MD;  Location: [place];  Service: Vascular;  Laterality: Left;  . KNEE ARTHROSCOPY Bilateral    "cartilage"  . Laparascopic cryoablation of left kidney  08/2006   Dr. Gladis Riffle for renal cell cancer  . LEFT HEART CATHETERIZATION WITH CORONARY ANGIOGRAM N/A 05/11/2011   Procedure: LEFT HEART CATHETERIZATION WITH CORONARY ANGIOGRAM;  Surgeon: Jolaine Artist, MD;  Location: [facility] CATH LAB;  Service: Cardiovascular;  Laterality: N/A;  . LUNG SURGERY Right    lung nodule removed from the right side  . PR VEIN BYPASS GRAFT,AORTO-FEM-POP  05/27/2010  . SHOULDER ARTHROSCOPY Bilateral    'Spurs"  . TUBAL LIGATION      Family History  Problem Relation Age of Onset  . Heart disease Mother   . Lung cancer Mother   . Cancer Mother        Lung  . Heart disease Father   . Lung cancer Father   . Cancer Father        Lung  . Heart disease Sister        CABG- Open Heart    Social History:  reports that she has been smoking cigarettes.  She has a 75.00 pack-year smoking history. She has never used smokeless tobacco. She reports that she does not drink alcohol or use drugs.    Review of Systems   Syncope spells: She says she has had episodes of passing out and was also in the hospital  She is feeling dizzy at times She is supposed to follow-up with her PCP tomorrow Currently not on any antihypertensives or diuretics  Lipid history:  She has good control with taking pravastatin but has relatively high triglycerides, last checked by PCP   Lab Results  Component Value Date   CHOL 219 (H) 04/20/2017   HDL 48 04/20/2017   LDLCALC Comment 04/20/2017   LDLDIRECT 82.0 08/02/2015   TRIG 421 (H) 04/20/2017   CHOLHDL 4.6 (H) 04/20/2017               Neurological:    Has no numbness, burning but does have pains , burning and some tingling in feet.   Symptoms had improved with  Cymbalta  60 mg She is being treated with nortriptyline by PCP also  She has had  peripheral vascular disease and Taking Pletal from vascular surgeon  Long history of thyroid disease, Has had post ablative hypothyroidism, on 137ug with recently high TSH   Lab Results  Component Value Date   TSH 9.596 (H) 04/23/2017    Last  Foot exam findings: Monofilament sensation is markedly reduced across most of the toes and distal plantar surfaces  She is on multiple antidepressants including nortriptyline and Seroquel and  Physical Examination:  BP (!) 100/40   Pulse 71   Ht 5\' 1"  (1.549 m)   Wt 157 lb 9.6 oz (71.5 kg)   SpO2 95%   BMI 29.78 kg/m    Standing blood pressure was 884 systolic She has a short ejection murmur at the base No pedal edema  ASSESSMENT:   Diabetes type 2, uncontrolled    See history of present illness for detailed discussion of his current management, blood sugar patterns and problems identified  Recently her blood sugars are averaging well over 300 This is likely to be from not getting any basal insulin for quite some time Also she is off her diet and also drinking regular soft drinks frequently Surprisingly is not very symptomatic with hyperglycemia except for frequent nocturia  She also does not understand the concept of taking Humalog before meals and takes the evening dose at bedtime She had been taking metformin and also previously Invokana but not clear if she can take these because of her impaired renal function with GFR about 30 in March  History of syncope, dizziness  PLAN:     She will restart Toujeo  Discussed cutting back on high carbohydrate and high sugar items especially regular soft drinks  Start checking blood sugars more often after meals and discussed when to check them  Discussed blood  sugar targets  Also discussed that Toujeo may need to be adjusted based on her fasting blood sugar over the next couple of weeks  She will not restart metformin unless renal function is normal  To increase her suppertime Humalog to 14-16 units and take it consistently before eating   She does need more consistent follow-up  Mild orthostatic drop in blood pressure: Not clear this is related to diabetic autonomic neuropathy or combination of this and recent significant hypoglycemia and decreased blood volume We will review again on her next visit but consider using Florinef if blood pressures consistently low  HYPOTHYROIDISM: TSH was high and she is going to follow-up with her PCP who is treating her hypothyroidism  Patient Instructions  Stop all regular soft drinks  CHECKING blood sugar: This needs to be done at least twice a day before breakfast and suppertime and at least every  other day either after breakfast or at bedtime MORNING blood sugars need to be at least under 140  TOUJEO insulin: Start 60 units in the morning daily but call if blood sugars in the mornings are staying over 200 or going below 80  HUMALOG insulin: Take 10 units for a small meal and 14 units for a large meal, may take another 10 units if eating lunch also Remember not to take Humalog at bedtime Do not take metformin as yet      Counseling time on subjects discussed in assessment and plan sections is over 50% of today's 25 minute visit   Elayne Snare 08/17/2017, 3:25 PM   Note: This office note was prepared with Dragon voice recognition system technology. Any transcriptional errors that result from this process are unintentional.

## 2017-08-17 NOTE — Patient Instructions (Signed)
Stop all regular soft drinks  CHECKING blood sugar: This needs to be done at least twice a day before breakfast and suppertime and at least every other day either after breakfast or at bedtime MORNING blood sugars need to be at least under 140  TOUJEO insulin: Start 60 units in the morning daily but call if blood sugars in the mornings are staying over 200 or going below 80  HUMALOG insulin: Take 10 units for a small meal and 14 units for a large meal, may take another 10 units if eating lunch also Remember not to take Humalog at bedtime Do not take metformin as yet

## 2017-08-28 ENCOUNTER — Other Ambulatory Visit: Payer: Self-pay | Admitting: Endocrinology

## 2017-09-07 ENCOUNTER — Other Ambulatory Visit: Payer: Self-pay | Admitting: Endocrinology

## 2017-09-11 ENCOUNTER — Other Ambulatory Visit: Payer: Self-pay | Admitting: Vascular Surgery

## 2017-09-11 DIAGNOSIS — I739 Peripheral vascular disease, unspecified: Secondary | ICD-10-CM

## 2017-09-17 ENCOUNTER — Other Ambulatory Visit: Payer: Self-pay | Admitting: Endocrinology

## 2017-09-17 ENCOUNTER — Other Ambulatory Visit (INDEPENDENT_AMBULATORY_CARE_PROVIDER_SITE_OTHER): Payer: Medicare Other

## 2017-09-17 DIAGNOSIS — E1165 Type 2 diabetes mellitus with hyperglycemia: Secondary | ICD-10-CM

## 2017-09-17 DIAGNOSIS — Z794 Long term (current) use of insulin: Secondary | ICD-10-CM | POA: Diagnosis not present

## 2017-09-17 LAB — BASIC METABOLIC PANEL
BUN: 26 mg/dL — AB (ref 6–23)
CHLORIDE: 101 meq/L (ref 96–112)
CO2: 25 mEq/L (ref 19–32)
Calcium: 9.1 mg/dL (ref 8.4–10.5)
Creatinine, Ser: 1.55 mg/dL — ABNORMAL HIGH (ref 0.40–1.20)
GFR: 35.12 mL/min — ABNORMAL LOW (ref >60.00)
GLUCOSE: 325 mg/dL — AB (ref 70–99)
POTASSIUM: 3.9 meq/L (ref 3.5–5.1)
Sodium: 135 mEq/L (ref 135–145)

## 2017-09-17 LAB — LIPID PANEL
Cholesterol: 158 mg/dL (ref 0–200)
HDL: 34.6 mg/dL — ABNORMAL LOW (ref >39.00)
NonHDL: 123.54
Total CHOL/HDL Ratio: 5
Triglycerides: 328 mg/dL — ABNORMAL HIGH (ref 0.0–149.0)
VLDL: 65.6 mg/dL — ABNORMAL HIGH (ref 0.0–40.0)

## 2017-09-17 LAB — LDL CHOLESTEROL, DIRECT: LDL DIRECT: 87 mg/dL

## 2017-09-18 LAB — FRUCTOSAMINE: Fructosamine: 351 umol/L — ABNORMAL HIGH (ref 0–285)

## 2017-09-20 ENCOUNTER — Other Ambulatory Visit: Payer: Medicare Other

## 2017-09-22 ENCOUNTER — Encounter: Payer: Self-pay | Admitting: Endocrinology

## 2017-09-22 ENCOUNTER — Ambulatory Visit (INDEPENDENT_AMBULATORY_CARE_PROVIDER_SITE_OTHER): Payer: Medicare Other | Admitting: Endocrinology

## 2017-09-22 VITALS — BP 130/60 | HR 86 | Ht 61.0 in | Wt 155.6 lb

## 2017-09-22 DIAGNOSIS — E1165 Type 2 diabetes mellitus with hyperglycemia: Secondary | ICD-10-CM | POA: Diagnosis not present

## 2017-09-22 DIAGNOSIS — F339 Major depressive disorder, recurrent, unspecified: Secondary | ICD-10-CM | POA: Diagnosis not present

## 2017-09-22 DIAGNOSIS — E038 Other specified hypothyroidism: Secondary | ICD-10-CM | POA: Diagnosis not present

## 2017-09-22 DIAGNOSIS — Z794 Long term (current) use of insulin: Secondary | ICD-10-CM

## 2017-09-22 MED ORDER — DULOXETINE HCL 60 MG PO CPEP: 60.0000 mg | ORAL_CAPSULE | Freq: Every day | ORAL | 3 refills | Status: DC

## 2017-09-22 NOTE — Patient Instructions (Addendum)
Take 16 -18 units of Humalog  Toujeo 40 units at 7 am and 7 pm  Check sugar 3x daily  No cokes and cookies  Balanced meals with protein  Check blood sugars on waking up    Also check blood sugars about 2 hours after a meal and do this after different meals by rotation  Recommended blood sugar levels on waking up is 90-140 and about 2 hours after meal is 130-180  Please bring your blood sugar monitor to each visit, thank you

## 2017-09-22 NOTE — Progress Notes (Signed)
Patient ID: April Branch, female   DOB: [birth year], 72 y.o.   MRN: [id number]           Reason for Appointment: Follow-up  for Type 2 Diabetes  Referring physician: Pattricia Boss  History of Present Illness:          Date of diagnosis of type 2 diabetes mellitus: 1980s       Previous history:    She thinks she was treated with metformin for some time before she went on insulin in ?  2002   She has mostly been treated with Lantus and rapid acting insulin such as Apidra or Novolog.   She thinks that Apidra worked fairly fast but insurance did not cover this, generally has had poor control   On her initial consultation she was switched from Lantus to [facility] because of blood sugars mostly over 300  Was also started on metformin ER 750 mg, 2 tablets daily initially and  in 3/17 she was started on Invokana 100 mg daily because of  poor control  Recent history:   INSULIN regimen is described as:   Toujeo 60 units in am, 10 Humalog before meals bid   Her A1c has gone up significantly to 9.7 in 6/19 and appears to be persistently high  Current blood sugar patterns and problems identified:  She was started back on her Toujeo insulin about a month ago  However despite taking 60 units her blood sugars are still averaging over 300 including significant hyperglycemia when she first checks her sugar in the daytime  This is not any better than on her last visit  She says that she try to increase her Humalog to 20 units but this caused symptoms of hypoglycemia and is only taking 10 units  Most of her food intake is in the evening at suppertime, in the morning at breakfast she will only eat a cookie around midday  She is drinking about 1 can of regular soft drink and has not been able to cut back as much as a desirable  Also her blood sugar later in the day are fairly consistently high including recently and only rarely below 200  She says she is under a lot of stress because of the death of  her sister Previously was on [place] but this has not been continued because of renal dysfunction  Oral hypoglycemic drugs the patient is taking are: None  Side effects from medications have been: none  Compliance with the medical regimen: Inconsistent  Hypoglycemia: none  Glucose monitoring:  done  1.4  times a day         Glucometer: Accucheck      Blood Glucose readings by monitor download:  AVERAGE glucose 325  Average glucose in the morning about 300 Average blood sugar early afternoon 394 and evening 277 at suppertime Usually not checking at bedtime   Self-care: The diet that the patient has been following is: tries to limit  High-fat foods.      Typical meal intake: Breakfast is variable at 11 AM, frequently no lunch, dinner 7 pm              Dietician visit, most recent: never               Exercise:  some walking limited by leg pain and dizziness  Weight history:  Highest 199   Wt Readings from Last 3 Encounters:  09/22/17 155 lb 9.6 oz (70.6 kg)  08/17/17 157 lb 9.6 oz (  71.5 kg)  05/04/17 156 lb 12.8 oz (71.1 kg)    Glycemic control:   Lab Results  Component Value Date   HGBA1C 9.7 (A) 08/17/2017   HGBA1C 9.2 (H) 10/31/2016   HGBA1C 10.1 (H) 10/12/2016   Lab Results  Component Value Date   MICROALBUR 1.8 08/17/2017   [place] Comment 04/20/2017   CREATININE 1.55 (H) 09/17/2017    Lab on 09/17/2017  Component Date Value Ref Range Status  . Cholesterol 09/17/2017 158  0 - 200 mg/dL Final   ATP III Classification       Desirable:  < 200 mg/dL               Borderline High:  200 - 239 mg/dL          High:  > = 240 mg/dL  . Triglycerides 09/17/2017 328.0* 0.0 - 149.0 mg/dL Final   Normal:  <150 mg/dLBorderline High:  150 - 199 mg/dL  . HDL 09/17/2017 34.60* >39.00 mg/dL Final  . VLDL 09/17/2017 65.6* 0.0 - 40.0 mg/dL Final  . Total CHOL/HDL Ratio 09/17/2017 5   Final                  Men          Women1/2 Average Risk     3.4          3.3Average Risk           5.0          4.42X Average Risk          9.6          7.13X Average Risk          15.0          11.0                      . NonHDL 09/17/2017 123.54   Final   NOTE:  Non-HDL goal should be 30 mg/dL higher than patient's LDL goal (i.e. LDL goal of < 70 mg/dL, would have non-HDL goal of < 100 mg/dL)  . Fructosamine 09/17/2017 351* 0 - 285 umol/L Final   Comment: Published reference interval for apparently healthy subjects between age 28 and 11 is 36 - 285 umol/L and in a poorly controlled diabetic population is 228 - 563 umol/L with a mean of 396 umol/L.   [place] Kitchen Sodium 09/17/2017 135  135 - 145 mEq/L Final  . Potassium 09/17/2017 3.9  3.5 - 5.1 mEq/L Final  . Chloride 09/17/2017 101  96 - 112 mEq/L Final  . CO2 09/17/2017 25  19 - 32 mEq/L Final  . Glucose, Bld 09/17/2017 325* 70 - 99 mg/dL Final  . BUN 09/17/2017 26* 6 - 23 mg/dL Final  . Creatinine, Ser 09/17/2017 1.55* 0.40 - 1.20 mg/dL Final  . Calcium 09/17/2017 9.1  8.4 - 10.5 mg/dL Final  . GFR 09/17/2017 35.12* >60.00 mL/min Final  . Direct LDL 09/17/2017 87.0  mg/dL Final   Optimal:  <100 mg/dLNear or Above Optimal:  100-129 mg/dLBorderline High:  130-159 mg/dLHigh:  160-189 mg/dLVery High:  >190 mg/dL    OTHER active problems: See review of systems    Allergies as of 09/22/2017   No Known Allergies     Medication List        Accurate as of 09/22/17  9:44 PM. Always use your most recent med list.          ACCU-CHEK FASTCLIX LANCETS Misc Use 4  per day to check blood sugar dx code E11.9   cilostazol 100 MG tablet Commonly known as:  PLETAL TAKE 1 TABLET BY MOUTH TWICE A DAY BEFORE MEALS   clopidogrel 75 MG tablet Commonly known as:  PLAVIX Take 75 mg by mouth daily.   DULoxetine 60 MG capsule Commonly known as:  CYMBALTA Take 1 capsule (60 mg total) by mouth daily.   ferrous sulfate 325 (65 FE) MG tablet Take 325 mg by mouth daily with breakfast.   HYDROcodone-acetaminophen 10-325 MG tablet Commonly  known as:  NORCO Take 1 tablet by mouth 3 (three) times daily as needed (for pain). Follow up with PCP if refills needed   insulin lispro 100 UNIT/ML KiwkPen Commonly known as:  HUMALOG KWIKPEN Inject 0.1 mLs (10 Units total) into the skin 3 (three) times daily as needed (blood sugar).   Insulin Pen Needle 32G X 4 MM Misc Commonly known as:  BD PEN NEEDLE NANO U/F Use to inject insulin   lansoprazole 30 MG capsule Commonly known as:  PREVACID Take 1 capsule (30 mg total) by mouth daily.   levothyroxine 175 MCG tablet Commonly known as:  SYNTHROID, LEVOTHROID Take 175 mcg by mouth daily.   metoprolol succinate 25 MG 24 hr tablet Commonly known as:  TOPROL XL Take 1 tablet (25 mg total) by mouth daily.   nortriptyline 75 MG capsule Commonly known as:  PAMELOR Take 75 mg by mouth at bedtime.   potassium chloride 10 MEQ tablet Commonly known as:  KLOR-CON 10 Take 1 tablet (10 mEq total) by mouth daily.   pravastatin 20 MG tablet Commonly known as:  PRAVACHOL Take 20 mg by mouth daily.   QUEtiapine 100 MG tablet Commonly known as:  SEROQUEL Take 100 mg by mouth at bedtime.   tolterodine 4 MG 24 hr capsule Commonly known as:  DETROL LA Take 4 mg by mouth daily.   TOUJEO SOLOSTAR 300 UNIT/ML Sopn Generic drug:  Insulin Glargine INJECT 60 UNITS INTO THE SKIN DAILY.   Vitamin D (Ergocalciferol) 50000 units Caps capsule Commonly known as:  DRISDOL Take 50,000 Units by mouth once a week.       Allergies: No Known Allergies  Past Medical History:  Diagnosis Date  . Anemia   . Anxiety   . Arthritis   . CAD (coronary artery disease) 2016   non-obstructive at cath  . Colon polyps 09/11/2010   Tubular adenoma  . COPD 12/14/2008   PATIENT DENIES    . Depression   . Diabetic coma ([place]) Feb. 2014  . Fall at home 2016   x 2 in January 2016, Left knee  . Fibromyalgia   . GERD 07/20/2006  . Headache(784.0)   . History of transient ischemic attack (TIA)   . HPV  (human papilloma virus) infection   . Hyperlipidemia   . Hypertension   . Hypothyroidism   . Insulin dependent type 2 diabetes mellitus, controlled ([place]) 1989  . Lumbar disc disease   . Neuromuscular disorder ([place])    PERIPHERAL NEUROPATHY  . Peripheral vascular disease ([place])   . Personal history of malignant neoplasm of kidney(V10.52) 12/14/2008   Laparoscopic biopsy and cryoablation 7/08 Dr. Vernie Shanks   . Renal disorder    chronic kidney disease   . Stroke [facility])    3 MINI STROKES  RT SIDED WEAKNESS  . Vertigo     Past Surgical History:  Procedure Laterality Date  . ABDOMINAL AORTAGRAM N/A 08/21/2011   Procedure: ABDOMINAL AORTAGRAM;  Surgeon: Elam Dutch, MD;  Location[facility] C[facility] LAB;  Service: Cardiovascular;  Laterality: N/A;  . ABDOMINAL AORTAGRAM N/A 03/16/2014   Procedure: ABDOMINAL Maxcine Ham;  Surgeon: Elam Dutch, MD;  Location: [facility] CATH LAB;  Service: Cardiovascular;  Laterality: N/A;  . ABDOMINAL HYSTERECTOMY    . APPENDECTOMY    . BLADDER SUSPENSION    . CARDIAC CATHETERIZATION    . CHOLECYSTECTOMY OPEN    . ESOPHAGOGASTRODUODENOSCOPY (EGD) WITH PROPOFOL N/A 05/16/2014   Procedure: ESOPHAGOGASTRODUODENOSCOPY (EGD) WITH PROPOFOL with Balloon dilation;  Surgeon: Milus Banister, MD;  Location: [place];  Service: Endoscopy;  Laterality: N/A;  . ESOPHAGOGASTRODUODENOSCOPY (EGD) WITH PROPOFOL N/A 09/02/2015   Procedure: ESOPHAGOGASTRODUODENOSCOPY (EGD) WITH PROPOFOL;  Surgeon: Doran Stabler, MD;  Location: [place];  Service: Gastroenterology;  Laterality: N/A;  . FEMORAL-POPLITEAL BYPASS GRAFT  10/12/2011   Procedure: BYPASS GRAFT FEMORAL-POPLITEAL ARTERY;  Surgeon: Elam Dutch, MD;  Location: [facility] OR;  Service: Vascular;  Laterality: Left;  Left Femoral-Popliteal Bypass Graft using 45mm x 80cm Propaten Graft with intraop arteriogram times one.  . FEMORAL-POPLITEAL BYPASS GRAFT Left 04/17/2014   Procedure: REDO LEFT FEMORAL-POPLITEAL ARTERY BYPASS USING GORE  PROPATEN 73mmx80cm GRAFT;  Surgeon: Elam Dutch, MD;  Location: [place];  Service: Vascular;  Laterality: Left;  . FOOT SURGERY Left    "bone spurs"  . INTRAOPERATIVE ARTERIOGRAM  10/12/2011   Procedure: INTRA OPERATIVE ARTERIOGRAM;  Surgeon: Elam Dutch, MD;  Location: [place];  Service: Vascular;  Laterality: Left;  . KNEE ARTHROSCOPY Bilateral    "cartilage"  . Laparascopic cryoablation of left kidney  08/2006   Dr. Gladis Riffle for renal cell cancer  . LEFT HEART CATHETERIZATION WITH CORONARY ANGIOGRAM N/A 05/11/2011   Procedure: LEFT HEART CATHETERIZATION WITH CORONARY ANGIOGRAM;  Surgeon: Jolaine Artist, MD;  Location[facility] CATH LAB;  Service: Cardiovascular;  Laterality: N/A;  . LUNG SURGERY Right    lung nodule removed from the right side  . PR VEIN BYPASS GRAFT,AORTO-FEM-POP  05/27/2010  . SHOULDER ARTHROSCOPY Bilateral    'Spurs"  . TUBAL LIGATION      Family History  Problem Relation Age of Onset  . Heart disease Mother   . Lung cancer Mother   . Cancer Mother        Lung  . Heart disease Father   . Lung cancer Father   . Cancer Father        Lung  . Heart disease Sister        CABG- Open Heart    Social History:  reports that she has been smoking cigarettes.  She has a 75.00 pack-year smoking history. She has never used smokeless tobacco. She reports that she does not drink alcohol or use drugs.    Review of Systems   DEPRESSION: She says that she was on Seroquel and Cymbalta and this was stopped by her PCP, currently only on nortriptyline for her neuropathy  Lipid history:  She has good control with taking pravastatin but has relatively high triglycerides again   Lab Results  Component Value Date   CHOL 158 09/17/2017   HDL 34.60 (L) 09/17/2017   LDLCALC Comment 04/20/2017   LDLDIRECT 87.0 09/17/2017   TRIG 328.0 (H) 09/17/2017   CHOLHDL 5 09/17/2017             Neurological:    Has no numbness, burning but does have pains , burning and some tingling  in feet.   Symptoms had improved with  Cymbalta  60 mg but this was stopped by her PCP  She is being treated with nortriptyline by PCP   She has had  peripheral vascular disease and Taking Pletal from vascular surgeon  Long history of thyroid disease, Has had post ablative hypothyroidism, on 175ug and followed by PCP   Lab Results  Component Value Date   TSH 9.596 (H) 04/23/2017    Last  Foot exam findings: Monofilament sensation is markedly reduced across most of the toes and distal plantar surfaces  She is on multiple antidepressants including nortriptyline and Seroquel   Physical Examination:  BP 130/60 (BP Location: Left Arm, Patient Position: Sitting, Cuff Size: Normal)   Pulse 86   Ht 5\' 1"  (1.549 m)   Wt 155 lb 9.6 oz (70.6 kg)   SpO2 98%   BMI 29.40 kg/m     ASSESSMENT:   Diabetes type 2, uncontrolled    See history of present illness for detailed discussion of his current management, blood sugar patterns and problems identified  She is appearing poorly controlled again with no improvement in blood sugars despite adding 60 units of Toujeo Currently not taking metformin or Invokana because of renal dysfunction  She still has significantly high fasting readings despite taking her basal insulin which she doing 60 units in the morning However taking only small amounts of Humalog, she thinks she cannot take as much as 20 units because of tendency to hypoglycemia with that  Worsening depression: She is having significant depression especially with her loss of a close family member and not being on any medication, previously had done well with Cymbalta  History of low blood pressure and on no orthostasis: Appears to be somewhat better  PLAN:    She will take Toujeo twice a day and given her flowsheet to adjust the dose of every 3 days She can start at 40 units twice daily She will cut back on soft drinks and cookies She needs to take at least 16 to 8 units  Humalog at suppertime She will follow-up with diabetes educator for short-term evaluation  Untreated depression: She will start back on Cymbalta 60 mg daily which may also help her neuropathy  HYPOTHYROIDISM: Follow-up with her PCP who is treating her hypothyroidism  LIPIDS: Triglycerides are high likely to be from hyperglycemia but LDL is controlled  Total visit time for evaluation and management of multiple problems and counseling =25 minutes  Patient Instructions  Take 16 -18 units of Humalog  Toujeo 40 units at 7 am and 7 pm  Check sugar 3x daily  No cokes and cookies  Balanced meals with protein  Check blood sugars on waking up    Also check blood sugars about 2 hours after a meal and do this after different meals by rotation  Recommended blood sugar levels on waking up is 90-140 and about 2 hours after meal is 130-180  Please bring your blood sugar monitor to each visit, thank you          Elayne Snare 09/22/2017, 9:44 PM   Note: This office note was prepared with Dragon voice recognition system technology. Any transcriptional errors that result from this process are unintentional.

## 2017-09-29 ENCOUNTER — Telehealth: Payer: Self-pay | Admitting: Endocrinology

## 2017-09-29 NOTE — Telephone Encounter (Signed)
Pt is asking if we can call in ativan for her since she is having such a hard time with the loss of her sister. Please advise  If you agree please call it into [facility]

## 2017-09-29 NOTE — Telephone Encounter (Signed)
Pt is aware of this information. 

## 2017-09-29 NOTE — Telephone Encounter (Signed)
She will have to get this from her PCP, I can only give her prescriptions like Cymbalta

## 2017-10-12 ENCOUNTER — Telehealth: Payer: Self-pay | Admitting: Endocrinology

## 2017-10-12 ENCOUNTER — Encounter: Payer: Medicare Other | Attending: Endocrinology | Admitting: Nutrition

## 2017-10-12 DIAGNOSIS — R739 Hyperglycemia, unspecified: Secondary | ICD-10-CM

## 2017-10-12 NOTE — Telephone Encounter (Signed)
Omnipod pump settings: BASAL rate 2 units per hour Preset boluses: 12 units with breakfast and lunch and 16 units with dinner

## 2017-10-12 NOTE — Progress Notes (Signed)
Patient is here to start her new OmniPod dash pump.  She did not take her Toujeo this AM, or last night.  Says was only taking 60u once daily in the AM.  She was doing 10u ac meals of Humalog. Her Dash was set up with setting per Dr. [name]:  Basal rate: 2.0, I/C ratio: 10, ISF: 40, target: 120 with correction over 120 (per patient's request), and timing 4 hours.  She filled a pod with humalog insulin, and applied it to her upper left arm.   We reviewed how to give a bolus, and she re demonstrated how to do this correctly.  We linked her meter to her dash, and she did a blood sugar reading.  It was 261.  She gave a correction dose of 3.4u at 11AM.  She had no final questions.   She was given a log sheet and told to test ac and HS, even if she is not eating.  She agreed to do this.

## 2017-10-12 NOTE — Patient Instructions (Addendum)
Test blood sugar before meals and at bedtime Give 12u at breakfast and lunch, and 16u at supper.

## 2017-10-13 ENCOUNTER — Encounter: Payer: Medicare Other | Admitting: Nutrition

## 2017-10-13 ENCOUNTER — Ambulatory Visit (INDEPENDENT_AMBULATORY_CARE_PROVIDER_SITE_OTHER): Payer: Medicare Other | Admitting: Endocrinology

## 2017-10-13 ENCOUNTER — Encounter: Payer: Self-pay | Admitting: Endocrinology

## 2017-10-13 VITALS — BP 126/60 | HR 64 | Ht 61.0 in | Wt 153.0 lb

## 2017-10-13 DIAGNOSIS — Z794 Long term (current) use of insulin: Secondary | ICD-10-CM

## 2017-10-13 DIAGNOSIS — E1165 Type 2 diabetes mellitus with hyperglycemia: Secondary | ICD-10-CM | POA: Diagnosis not present

## 2017-10-13 DIAGNOSIS — E119 Type 2 diabetes mellitus without complications: Secondary | ICD-10-CM

## 2017-10-13 NOTE — Telephone Encounter (Signed)
Noted  

## 2017-10-13 NOTE — Progress Notes (Signed)
Patient ID: April Branch, female   DOB: 06/04/1945, 72 y.o.   MRN: [id number]           Reason for Appointment: Follow-up  for Type 2 Diabetes  Referring physician: Pattricia Boss  History of Present Illness:          Date of diagnosis of type 2 diabetes mellitus: 1980s       Previous history:    She thinks she was treated with metformin for some time before she went on insulin in ?  2002   She has mostly been treated with Lantus and rapid acting insulin such as Apidra or Novolog.   She thinks that Apidra worked fairly fast but insurance did not cover this, generally has had poor control   On her initial consultation she was switched from Lantus to [employer] because of blood sugars mostly over 300  Was also started on metformin ER 750 mg, 2 tablets daily initially and  in 3/17 she was started on Invokana 100 mg daily because of  poor control  Recent history:   INSULIN regimen: OMNIPOD pump  Current settings basal rate 2 units/h, boluses, presets 12 units for breakfast and lunch and 16 for dinner  Her A1c has gone up significantly to 9.7 in 6/19 and she has had persistently poor control  Current blood sugar patterns and problems identified:  She was advised to take her Toujeo 40 units twice daily on her last visit and she thinks she has been doing this  However she thinks when she takes more Humalog than 10 to 15 units she will have low blood sugars  She started on the pump yesterday and her blood sugar initially was 260 late morning  Although she thinks she took her bolus for her lunch after she left here the only bolus she has was a correction dose at 10 AM and a 3 unit bolus between 4-5 PM  Blood sugar at 430 was about 231  Around 7:45 PM she had a blood sugar of 92 but even though she did not want to eat any food at that time she gave herself a 9 unit bolus with post bolus reading of 69  Fasting today was 75 and she felt shaky and weak  She did not bolus for her breakfast and  her blood sugar late morning was 147 in the office  Previously was on Invokana but this has not been continued because of renal dysfunction  Oral hypoglycemic drugs the patient is taking are: None  Side effects from medications have been: none  Compliance with the medical regimen: Inconsistent  Hypoglycemia: none  Glucose monitoring:  done  1.4  times a day         Glucometer: Accucheck      Blood Glucose readings by monitor download:  AVERAGE glucose 325  Average glucose in the morning about 300 Average blood sugar early afternoon 394 and evening 277 at suppertime Usually not checking at bedtime   Self-care: The diet that the patient has been following is: tries to limit  High-fat foods.      Typical meal intake: Breakfast is variable at 11 AM, frequently no lunch, dinner 7 pm              Dietician visit, most recent: never               Exercise:  some walking limited by leg pain and dizziness  Weight history:  Highest 199   Wt Readings from  Last 3 Encounters:  10/13/17 153 lb (69.4 kg)  09/22/17 155 lb 9.6 oz (70.6 kg)  08/17/17 157 lb 9.6 oz (71.5 kg)    Glycemic control:   Lab Results  Component Value Date   HGBA1C 9.7 (A) 08/17/2017   HGBA1C 9.2 (H) 10/31/2016   HGBA1C 10.1 (H) 10/12/2016   Lab Results  Component Value Date   MICROALBUR 1.8 08/17/2017   [place] Comment 04/20/2017   CREATININE 1.55 (H) 09/17/2017    No visits with results within 1 Week(s) from this visit.  Latest known visit with results is:  Lab on 09/17/2017  Component Date Value Ref Range Status  . Cholesterol 09/17/2017 158  0 - 200 mg/dL Final   ATP III Classification       Desirable:  < 200 mg/dL               Borderline High:  200 - 239 mg/dL          High:  > = 240 mg/dL  . Triglycerides 09/17/2017 328.0* 0.0 - 149.0 mg/dL Final   Normal:  <150 mg/dLBorderline High:  150 - 199 mg/dL  . HDL 09/17/2017 34.60* >39.00 mg/dL Final  . VLDL 09/17/2017 65.6* 0.0 - 40.0 mg/dL Final  .  Total CHOL/HDL Ratio 09/17/2017 5   Final                  Men          Women1/2 Average Risk     3.4          3.3Average Risk          5.0          4.42X Average Risk          9.6          7.13X Average Risk          15.0          11.0                      . NonHDL 09/17/2017 123.54   Final   NOTE:  Non-HDL goal should be 30 mg/dL higher than patient's LDL goal (i.e. LDL goal of < 70 mg/dL, would have non-HDL goal of < 100 mg/dL)  . Fructosamine 09/17/2017 351* 0 - 285 umol/L Final   Comment: Published reference interval for apparently healthy subjects between age 45 and 30 is 15 - 285 umol/L and in a poorly controlled diabetic population is 228 - 563 umol/L with a mean of 396 umol/L.   [place] Kitchen Sodium 09/17/2017 135  135 - 145 mEq/L Final  . Potassium 09/17/2017 3.9  3.5 - 5.1 mEq/L Final  . Chloride 09/17/2017 101  96 - 112 mEq/L Final  . CO2 09/17/2017 25  19 - 32 mEq/L Final  . Glucose, Bld 09/17/2017 325* 70 - 99 mg/dL Final  . BUN 09/17/2017 26* 6 - 23 mg/dL Final  . Creatinine, Ser 09/17/2017 1.55* 0.40 - 1.20 mg/dL Final  . Calcium 09/17/2017 9.1  8.4 - 10.5 mg/dL Final  . GFR 09/17/2017 35.12* >60.00 mL/min Final  . Direct LDL 09/17/2017 87.0  mg/dL Final   Optimal:  <100 mg/dLNear or Above Optimal:  100-129 mg/dLBorderline High:  130-159 mg/dLHigh:  160-189 mg/dLVery High:  >190 mg/dL    OTHER active problems: See review of systems    Allergies as of 10/13/2017   No Known Allergies     Medication List  Accurate as of 10/13/17 11:24 AM. Always use your most recent med list.          ACCU-CHEK FASTCLIX LANCETS Misc Use 4 per day to check blood sugar dx code E11.9   cilostazol 100 MG tablet Commonly known as:  PLETAL TAKE 1 TABLET BY MOUTH TWICE A DAY BEFORE MEALS   clopidogrel 75 MG tablet Commonly known as:  PLAVIX Take 75 mg by mouth daily.   DULoxetine 60 MG capsule Commonly known as:  CYMBALTA Take 1 capsule (60 mg total) by mouth daily.   ferrous  sulfate 325 (65 FE) MG tablet Take 325 mg by mouth daily with breakfast.   HYDROcodone-acetaminophen 10-325 MG tablet Commonly known as:  NORCO Take 1 tablet by mouth 3 (three) times daily as needed (for pain). Follow up with PCP if refills needed   insulin lispro 100 UNIT/ML KiwkPen Commonly known as:  HUMALOG Inject 0.1 mLs (10 Units total) into the skin 3 (three) times daily as needed (blood sugar).   Insulin Pen Needle 32G X 4 MM Misc Use to inject insulin   lansoprazole 30 MG capsule Commonly known as:  PREVACID Take 1 capsule (30 mg total) by mouth daily.   levothyroxine 175 MCG tablet Commonly known as:  SYNTHROID, LEVOTHROID Take 175 mcg by mouth daily.   metoprolol succinate 25 MG 24 hr tablet Commonly known as:  TOPROL-XL Take 1 tablet (25 mg total) by mouth daily.   nortriptyline 75 MG capsule Commonly known as:  PAMELOR Take 75 mg by mouth at bedtime.   potassium chloride 10 MEQ tablet Commonly known as:  K-DUR Take 1 tablet (10 mEq total) by mouth daily.   pravastatin 20 MG tablet Commonly known as:  PRAVACHOL Take 20 mg by mouth daily.   QUEtiapine 100 MG tablet Commonly known as:  SEROQUEL Take 100 mg by mouth at bedtime.   tolterodine 4 MG 24 hr capsule Commonly known as:  DETROL LA Take 4 mg by mouth daily.   TOUJEO SOLOSTAR 300 UNIT/ML Sopn Generic drug:  Insulin Glargine INJECT 60 UNITS INTO THE SKIN DAILY.   Vitamin D (Ergocalciferol) 50000 units Caps capsule Commonly known as:  DRISDOL Take 50,000 Units by mouth once a week.       Allergies: No Known Allergies  Past Medical History:  Diagnosis Date  . Anemia   . Anxiety   . Arthritis   . CAD (coronary artery disease) 2016   non-obstructive at cath  . Colon polyps 09/11/2010   Tubular adenoma  . COPD 12/14/2008   PATIENT DENIES    . Depression   . Diabetic coma ([place]) Feb. 2014  . Fall at home 2016   x 2 in January 2016, Left knee  . Fibromyalgia   . GERD 07/20/2006  .  Headache(784.0)   . History of transient ischemic attack (TIA)   . HPV (human papilloma virus) infection   . Hyperlipidemia   . Hypertension   . Hypothyroidism   . Insulin dependent type 2 diabetes mellitus, controlled ([place]) 1989  . Lumbar disc disease   . Neuromuscular disorder ([place])    PERIPHERAL NEUROPATHY  . Peripheral vascular disease ([place])   . Personal history of malignant neoplasm of kidney(V10.52) 12/14/2008   Laparoscopic biopsy and cryoablation 7/08 Dr. Vernie Shanks   . Renal disorder    chronic kidney disease   . Stroke [facility])    3 MINI STROKES  RT SIDED WEAKNESS  . Vertigo     Past Surgical History:  Procedure Laterality Date  . ABDOMINAL AORTAGRAM N/A 08/21/2011   Procedure: ABDOMINAL Maxcine Ham;  Surgeon: Elam Dutch, MD;  Location: [facility] CATH LAB;  Service: Cardiovascular;  Laterality: N/A;  . ABDOMINAL AORTAGRAM N/A 03/16/2014   Procedure: ABDOMINAL Maxcine Ham;  Surgeon: Elam Dutch, MD;  Location: [facility] CATH LAB;  Service: Cardiovascular;  Laterality: N/A;  . ABDOMINAL HYSTERECTOMY    . APPENDECTOMY    . BLADDER SUSPENSION    . CARDIAC CATHETERIZATION    . CHOLECYSTECTOMY OPEN    . ESOPHAGOGASTRODUODENOSCOPY (EGD) WITH PROPOFOL N/A 05/16/2014   Procedure: ESOPHAGOGASTRODUODENOSCOPY (EGD) WITH PROPOFOL with Balloon dilation;  Surgeon: Milus Banister, MD;  Location: [place];  Service: Endoscopy;  Laterality: N/A;  . ESOPHAGOGASTRODUODENOSCOPY (EGD) WITH PROPOFOL N/A 09/02/2015   Procedure: ESOPHAGOGASTRODUODENOSCOPY (EGD) WITH PROPOFOL;  Surgeon: Doran Stabler, MD;  Location: [place];  Service: Gastroenterology;  Laterality: N/A;  . FEMORAL-POPLITEAL BYPASS GRAFT  10/12/2011   Procedure: BYPASS GRAFT FEMORAL-POPLITEAL ARTERY;  Surgeon: Elam Dutch, MD;  Location: [facility] OR;  Service: Vascular;  Laterality: Left;  Left Femoral-Popliteal Bypass Graft using 66mm x 80cm Propaten Graft with intraop arteriogram times one.  . FEMORAL-POPLITEAL BYPASS GRAFT Left  04/17/2014   Procedure: REDO LEFT FEMORAL-POPLITEAL ARTERY BYPASS USING GORE PROPATEN 30mmx80cm GRAFT;  Surgeon: Elam Dutch, MD;  Location: [place];  Service: Vascular;  Laterality: Left;  . FOOT SURGERY Left    "bone spurs"  . INTRAOPERATIVE ARTERIOGRAM  10/12/2011   Procedure: INTRA OPERATIVE ARTERIOGRAM;  Surgeon: Elam Dutch, MD;  Location: [place];  Service: Vascular;  Laterality: Left;  . KNEE ARTHROSCOPY Bilateral    "cartilage"  . Laparascopic cryoablation of left kidney  08/2006   Dr. Gladis Riffle for renal cell cancer  . LEFT HEART CATHETERIZATION WITH CORONARY ANGIOGRAM N/A 05/11/2011   Procedure: LEFT HEART CATHETERIZATION WITH CORONARY ANGIOGRAM;  Surgeon: Jolaine Artist, MD;  Location: [facility] CATH LAB;  Service: Cardiovascular;  Laterality: N/A;  . LUNG SURGERY Right    lung nodule removed from the right side  . PR VEIN BYPASS GRAFT,AORTO-FEM-POP  05/27/2010  . SHOULDER ARTHROSCOPY Bilateral    'Spurs"  . TUBAL LIGATION      Family History  Problem Relation Age of Onset  . Heart disease Mother   . Lung cancer Mother   . Cancer Mother        Lung  . Heart disease Father   . Lung cancer Father   . Cancer Father        Lung  . Heart disease Sister        CABG- Open Heart    Social History:  reports that she has been smoking cigarettes. She has a 75.00 pack-year smoking history. She has never used smokeless tobacco. She reports that she does not drink alcohol or use drugs.    Review of Systems   DEPRESSION: She says that she was on Seroquel and Cymbalta and this was stopped by her PCP, currently back on Cymbalta along with nortriptyline  Also takes this for neuropathy  Lipid history:  She has good control with taking pravastatin but has relatively high triglycerides    Lab Results  Component Value Date   CHOL 158 09/17/2017   HDL 34.60 (L) 09/17/2017   LDLCALC Comment 04/20/2017   LDLDIRECT 87.0 09/17/2017   TRIG 328.0 (H) 09/17/2017   CHOLHDL 5 09/17/2017             Neurological:    Has no numbness,  burning but does have pains , burning and some tingling in feet.   Symptoms had improved with  Cymbalta  60 mg   She is being treated with nortriptyline by PCP   She has had  peripheral vascular disease and treated with Pletal from vascular surgeon  Long history of thyroid disease, Has had post ablative hypothyroidism, on 175ug and followed by PCP, no recent labs available   Lab Results  Component Value Date   TSH 9.596 (H) 04/23/2017    Last  Foot exam findings: Monofilament sensation is markedly reduced across most of the toes and distal plantar surfaces    Physical Examination:  BP 126/60   Pulse 64   Ht 5\' 1"  (1.549 m)   Wt 153 lb (69.4 kg)   SpO2 98%   BMI 28.91 kg/m     ASSESSMENT:   Diabetes type 2, uncontrolled on insulin  See history of present illness for detailed discussion of his current management, blood sugar patterns and problems identified  Because of poor control with Toujeo and mealtime insulin she is now starting the Omnipod insulin pump Even though she reported was taking basal insulin of 80 units daily with the Omnipod pump and using Humalog she is getting low normal sugars even with a basal of 2 units/h  She does not appear to be very clear on what to do with her boluses and probably did not take her bolus for lunch but took 9 units in the evening even without eating a meal As above she probably can get by with less insulin at meals also since today without a bolus of blood sugar was still under 150 late morning  History of low normal blood pressure: Resolved  PLAN:    She will now use a basal rate of 1.6 Boluses will be 6-4-6 Discussed need to check sugars before and after meals to help adjust the pump She will continue Humalog and may also be able to use her previous Humalog pens or filling the pump Her basal rates will be reviewed and changed by nurse educator and also detailed instructions on  boluses to be reviewed again Discussed that unless she is eating a meal or significant carbohydrate intake she does not need to bolus except for high readings Follow-up tomorrow  There are no Patient Instructions on file for this visit.      Elayne Snare 10/13/2017, 11:24 AM   Note: This office note was prepared with Dragon voice recognition system technology. Any transcriptional errors that result from this process are unintentional.

## 2017-10-14 ENCOUNTER — Encounter: Payer: Self-pay | Admitting: Endocrinology

## 2017-10-14 ENCOUNTER — Ambulatory Visit (INDEPENDENT_AMBULATORY_CARE_PROVIDER_SITE_OTHER): Payer: Medicare Other | Admitting: Endocrinology

## 2017-10-14 VITALS — BP 106/48 | HR 73 | Ht 61.0 in | Wt 155.8 lb

## 2017-10-14 DIAGNOSIS — Z794 Long term (current) use of insulin: Secondary | ICD-10-CM

## 2017-10-14 DIAGNOSIS — E1165 Type 2 diabetes mellitus with hyperglycemia: Secondary | ICD-10-CM

## 2017-10-14 NOTE — Progress Notes (Signed)
Patient ID: April Branch, female   DOB: 1945/10/11, 72 y.o.   MRN: [id number]           Reason for Appointment: Follow-up  for Type 2 Diabetes  Referring physician: Pattricia Boss  History of Present Illness:          Date of diagnosis of type 2 diabetes mellitus: 1980s       Previous history:    She thinks she was treated with metformin for some time before she went on insulin in ?  2002   She has mostly been treated with Lantus and rapid acting insulin such as Apidra or Novolog.   She thinks that Apidra worked fairly fast but insurance did not cover this, generally has had poor control   On her initial consultation she was switched from Lantus to [employer] because of blood sugars mostly over 300  Was also started on metformin ER 750 mg, 2 tablets daily initially and  in 3/17 she was started on Invokana 100 mg daily because of  poor control  Recent history:   INSULIN regimen: OMNIPOD pump  Current settings basal rate 1.6 units/h, boluses, presets 6 units breakfast, 4 units lunch and significant dinnertime  Her A1c has gone up significantly to 9.7 in 6/19 and she has had persistently poor control  Current blood sugar patterns and problems identified:  Her pump and meter were not completely downloaded; data and glucose levels reviewed from meter and pump  She started on the pump 2 days ago  Difficult to know whether she is checking her blood sugars so not her meter with the pump as she has only 2 readings yesterday  Her blood sugar was 201 in the early afternoon when she had her lunch but not clear if she had any meals prior to that as there is no bolus history  Also with her lunch meal she took only had correction dose and no carbohydrate bolus  No entry of any blood sugar or bolus at dinner last night  She thinks she felt very shaky at about 1:30 AM today and had 3 cookies with relief of symptoms  Her glucose was 104 waking up but then she got low at around 9 AM down to 72 and  subsequently blood sugar has been 168  She took 16 units to cover 2 cookies at lunchtime and does not have a sugar tested afterwards  Previously was on Invokana but this has not been continued because of renal dysfunction  Oral hypoglycemic drugs the patient is taking are: None  Side effects from medications have been: none  Compliance with the medical regimen: Inconsistent    Glucose monitoring:  done 1-3 times a day         Glucometer: Accucheck/Freestyle      Blood Glucose readings by monitor download:  As above   Self-care: The diet that the patient has been following is: tries to limit  High-fat foods.      Typical meal intake: Breakfast is variable at 11 AM, frequently no lunch, dinner 7 pm              Dietician visit, most recent: never               Exercise:  Minimal walking limited by leg pain and dizziness  Weight history:  Highest 199   Wt Readings from Last 3 Encounters:  10/14/17 155 lb 12.8 oz (70.7 kg)  10/13/17 153 lb (69.4 kg)  09/22/17 155 lb 9.6  oz (70.6 kg)    Glycemic control:   Lab Results  Component Value Date   HGBA1C 9.7 (A) 08/17/2017   HGBA1C 9.2 (H) 10/31/2016   HGBA1C 10.1 (H) 10/12/2016   Lab Results  Component Value Date   MICROALBUR 1.8 08/17/2017   [place] Comment 04/20/2017   CREATININE 1.55 (H) 09/17/2017    No visits with results within 1 Week(s) from this visit.  Latest known visit with results is:  Lab on 09/17/2017  Component Date Value Ref Range Status  . Cholesterol 09/17/2017 158  0 - 200 mg/dL Final   ATP III Classification       Desirable:  < 200 mg/dL               Borderline High:  200 - 239 mg/dL          High:  > = 240 mg/dL  . Triglycerides 09/17/2017 328.0* 0.0 - 149.0 mg/dL Final   Normal:  <150 mg/dLBorderline High:  150 - 199 mg/dL  . HDL 09/17/2017 34.60* >39.00 mg/dL Final  . VLDL 09/17/2017 65.6* 0.0 - 40.0 mg/dL Final  . Total CHOL/HDL Ratio 09/17/2017 5   Final                  Men           Women1/2 Average Risk     3.4          3.3Average Risk          5.0          4.42X Average Risk          9.6          7.13X Average Risk          15.0          11.0                      . NonHDL 09/17/2017 123.54   Final   NOTE:  Non-HDL goal should be 30 mg/dL higher than patient's LDL goal (i.e. LDL goal of < 70 mg/dL, would have non-HDL goal of < 100 mg/dL)  . Fructosamine 09/17/2017 351* 0 - 285 umol/L Final   Comment: Published reference interval for apparently healthy subjects between age 76 and 104 is 62 - 285 umol/L and in a poorly controlled diabetic population is 228 - 563 umol/L with a mean of 396 umol/L.   [place] Kitchen Sodium 09/17/2017 135  135 - 145 mEq/L Final  . Potassium 09/17/2017 3.9  3.5 - 5.1 mEq/L Final  . Chloride 09/17/2017 101  96 - 112 mEq/L Final  . CO2 09/17/2017 25  19 - 32 mEq/L Final  . Glucose, Bld 09/17/2017 325* 70 - 99 mg/dL Final  . BUN 09/17/2017 26* 6 - 23 mg/dL Final  . Creatinine, Ser 09/17/2017 1.55* 0.40 - 1.20 mg/dL Final  . Calcium 09/17/2017 9.1  8.4 - 10.5 mg/dL Final  . GFR 09/17/2017 35.12* >60.00 mL/min Final  . Direct LDL 09/17/2017 87.0  mg/dL Final   Optimal:  <100 mg/dLNear or Above Optimal:  100-129 mg/dLBorderline High:  130-159 mg/dLHigh:  160-189 mg/dLVery High:  >190 mg/dL    OTHER active problems: See review of systems    Allergies as of 10/14/2017   No Known Allergies     Medication List        Accurate as of 10/14/17  2:42 PM. Always use your most recent med list.  ACCU-CHEK FASTCLIX LANCETS Misc Use 4 per day to check blood sugar dx code E11.9   cilostazol 100 MG tablet Commonly known as:  PLETAL TAKE 1 TABLET BY MOUTH TWICE A DAY BEFORE MEALS   clopidogrel 75 MG tablet Commonly known as:  PLAVIX Take 75 mg by mouth daily.   DULoxetine 60 MG capsule Commonly known as:  CYMBALTA Take 1 capsule (60 mg total) by mouth daily.   ferrous sulfate 325 (65 FE) MG tablet Take 325 mg by mouth daily with breakfast.     HYDROcodone-acetaminophen 10-325 MG tablet Commonly known as:  NORCO Take 1 tablet by mouth 3 (three) times daily as needed (for pain). Follow up with PCP if refills needed   Insulin Pen Needle 32G X 4 MM Misc Use to inject insulin   lansoprazole 30 MG capsule Commonly known as:  PREVACID Take 1 capsule (30 mg total) by mouth daily.   levothyroxine 175 MCG tablet Commonly known as:  SYNTHROID, LEVOTHROID Take 175 mcg by mouth daily.   metoprolol succinate 25 MG 24 hr tablet Commonly known as:  TOPROL-XL Take 1 tablet (25 mg total) by mouth daily.   nortriptyline 75 MG capsule Commonly known as:  PAMELOR Take 75 mg by mouth at bedtime.   potassium chloride 10 MEQ tablet Commonly known as:  K-DUR Take 1 tablet (10 mEq total) by mouth daily.   pravastatin 20 MG tablet Commonly known as:  PRAVACHOL Take 20 mg by mouth daily.   QUEtiapine 100 MG tablet Commonly known as:  SEROQUEL Take 100 mg by mouth at bedtime.   tolterodine 4 MG 24 hr capsule Commonly known as:  DETROL LA Take 4 mg by mouth daily.   Vitamin D (Ergocalciferol) 50000 units Caps capsule Commonly known as:  DRISDOL Take 50,000 Units by mouth once a week.       Allergies: No Known Allergies  Past Medical History:  Diagnosis Date  . Anemia   . Anxiety   . Arthritis   . CAD (coronary artery disease) 2016   non-obstructive at cath  . Colon polyps 09/11/2010   Tubular adenoma  . COPD 12/14/2008   PATIENT DENIES    . Depression   . Diabetic coma ([place]) Feb. 2014  . Fall at home 2016   x 2 in January 2016, Left knee  . Fibromyalgia   . GERD 07/20/2006  . Headache(784.0)   . History of transient ischemic attack (TIA)   . HPV (human papilloma virus) infection   . Hyperlipidemia   . Hypertension   . Hypothyroidism   . Insulin dependent type 2 diabetes mellitus, controlled ([place]) 1989  . Lumbar disc disease   . Neuromuscular disorder ([place])    PERIPHERAL NEUROPATHY  . Peripheral vascular  disease ([place])   . Personal history of malignant neoplasm of kidney(V10.52) 12/14/2008   Laparoscopic biopsy and cryoablation 7/08 Dr. Vernie Shanks   . Renal disorder    chronic kidney disease   . Stroke [facility])    3 MINI STROKES  RT SIDED WEAKNESS  . Vertigo     Past Surgical History:  Procedure Laterality Date  . ABDOMINAL AORTAGRAM N/A 08/21/2011   Procedure: ABDOMINAL Maxcine Ham;  Surgeon: Elam Dutch, MD;  Location: [facility] CATH LAB;  Service: Cardiovascular;  Laterality: N/A;  . ABDOMINAL AORTAGRAM N/A 03/16/2014   Procedure: ABDOMINAL Maxcine Ham;  Surgeon: Elam Dutch, MD;  Location: [facility] CATH LAB;  Service: Cardiovascular;  Laterality: N/A;  . ABDOMINAL HYSTERECTOMY    . APPENDECTOMY    .  BLADDER SUSPENSION    . CARDIAC CATHETERIZATION    . CHOLECYSTECTOMY OPEN    . ESOPHAGOGASTRODUODENOSCOPY (EGD) WITH PROPOFOL N/A 05/16/2014   Procedure: ESOPHAGOGASTRODUODENOSCOPY (EGD) WITH PROPOFOL with Balloon dilation;  Surgeon: Milus Banister, MD;  Location: [place];  Service: Endoscopy;  Laterality: N/A;  . ESOPHAGOGASTRODUODENOSCOPY (EGD) WITH PROPOFOL N/A 09/02/2015   Procedure: ESOPHAGOGASTRODUODENOSCOPY (EGD) WITH PROPOFOL;  Surgeon: Doran Stabler, MD;  Location: [place];  Service: Gastroenterology;  Laterality: N/A;  . FEMORAL-POPLITEAL BYPASS GRAFT  10/12/2011   Procedure: BYPASS GRAFT FEMORAL-POPLITEAL ARTERY;  Surgeon: Elam Dutch, MD;  Location[facility] OR;  Service: Vascular;  Laterality: Left;  Left Femoral-Popliteal Bypass Graft using 27mm x 80cm Propaten Graft with intraop arteriogram times one.  . FEMORAL-POPLITEAL BYPASS GRAFT Left 04/17/2014   Procedure: REDO LEFT FEMORAL-POPLITEAL ARTERY BYPASS USING GORE PROPATEN 16mmx80cm GRAFT;  Surgeon: Elam Dutch, MD;  Location: [place];  Service: Vascular;  Laterality: Left;  . FOOT SURGERY Left    "bone spurs"  . INTRAOPERATIVE ARTERIOGRAM  10/12/2011   Procedure: INTRA OPERATIVE ARTERIOGRAM;  Surgeon: Elam Dutch, MD;   Location: [place];  Service: Vascular;  Laterality: Left;  . KNEE ARTHROSCOPY Bilateral    "cartilage"  . Laparascopic cryoablation of left kidney  08/2006   Dr. Gladis Riffle for renal cell cancer  . LEFT HEART CATHETERIZATION WITH CORONARY ANGIOGRAM N/A 05/11/2011   Procedure: LEFT HEART CATHETERIZATION WITH CORONARY ANGIOGRAM;  Surgeon: Jolaine Artist, MD;  Location: [facility] C[facility] LAB;  Service: Cardiovascular;  Laterality: N/A;  . LUNG SURGERY Right    lung nodule removed from the right side  . PR VEIN BYPASS GRAFT,AORTO-FEM-POP  05/27/2010  . SHOULDER ARTHROSCOPY Bilateral    'Spurs"  . TUBAL LIGATION      Family History  Problem Relation Age of Onset  . Heart disease Mother   . Lung cancer Mother   . Cancer Mother        Lung  . Heart disease Father   . Lung cancer Father   . Cancer Father        Lung  . Heart disease Sister        CABG- Open Heart    Social History:  reports that she has been smoking cigarettes. She has a 75.00 pack-year smoking history. She has never used smokeless tobacco. She reports that she does not drink alcohol or use drugs.    Review of Systems   The following is a copy of the previous note  DEPRESSION: She says that she was on Seroquel and Cymbalta and this was stopped by her PCP, currently back on Cymbalta along with nortriptyline  Also takes this for neuropathy  Lipid history:  She has good control with taking pravastatin but has relatively high triglycerides    Lab Results  Component Value Date   CHOL 158 09/17/2017   HDL 34.60 (L) 09/17/2017   LDLCALC Comment 04/20/2017   LDLDIRECT 87.0 09/17/2017   TRIG 328.0 (H) 09/17/2017   CHOLHDL 5 09/17/2017            Neurological:    Has no numbness, burning but does have pains , burning and some tingling in feet.   Symptoms had improved with  Cymbalta  60 mg   She is being treated with nortriptyline by PCP   She has had  peripheral vascular disease and treated with Pletal from vascular  surgeon  Long history of thyroid disease, Has had post ablative hypothyroidism, on 175ug  and followed by PCP, no recent labs available   Lab Results  Component Value Date   TSH 9.596 (H) 04/23/2017    Last  Foot exam findings: Monofilament sensation is markedly reduced across most of the toes and distal plantar surfaces    Physical Examination:  BP (!) 106/48   Pulse 73   Ht 5\' 1"  (1.549 m)   Wt 155 lb 12.8 oz (70.7 kg)   SpO2 95%   BMI 29.44 kg/m     ASSESSMENT:   Diabetes type 2, uncontrolled on insulin pump  See history of present illness for detailed discussion of his current management, blood sugar patterns and problems identified  She is not checking her sugars enough or following instructions for the boluses as directed Does appear to have reportedly low sugars overnight either at 1:30 AM or 9 AM    PLAN:    She will reduce her basal rate down to 1.45 from midnight until 8 AM and then 1.6 Discussed importance of checking blood sugars consistently with every meal and after evening meal at least She will bolus according to what the pump is telling her and not skip the boluses for meals She will not take large amounts of boluses for just snacks  Follow-up 1 week She will call to report her blood sugars on Monday  There are no Patient Instructions on file for this visit.      Elayne Snare 10/14/2017, 2:42 PM   Note: This office note was prepared with Dragon voice recognition system technology. Any transcriptional errors that result from this process are unintentional.

## 2017-10-14 NOTE — Progress Notes (Signed)
Pt did not give lunch bolus yesterday, but gave the bolus last night for supper as directed, but did not eat supper. Discussed why you need to give a bolus, and that you do not take a bolus if not eating.  Care giver reported good understanding of this, as well as patient.  Changes to pump settings made per Dr. Ronnie Derby order:  Basal rate 1.6u/hr, and bolus amounts written and reviewed for her:  6u (60 carbs for bfast, 40 for lunch-if eating lunch, and 60 carbs for supper.   Steps for how to bolus were written for her as well as the amounts needed to be given at each meal.  Discussed the need to adjust these depending on the size of the meal, and what to do if patient takes insulin and can not eat all of meal.  They reported good understanding of this.   We reviewed some of topics on checklist as well as how to change the pod on Friday.  They were told to follow the steps in the manual, or call the help line if questions.  Telephone number given for this. We also reviewed different site options for the pod, and the need to rotate these sites appropriately.  She had no final questions, and signed the checklist per topics we discussed.

## 2017-10-14 NOTE — Patient Instructions (Signed)
Review steps on how to give a bolus Review steps on how to change the pod. Call help line if questions.

## 2017-10-15 ENCOUNTER — Other Ambulatory Visit: Payer: Self-pay

## 2017-10-15 ENCOUNTER — Telehealth: Payer: Self-pay

## 2017-10-15 MED ORDER — GLUCOSE BLOOD VI STRP: ORAL_STRIP | 8 refills | Status: DC

## 2017-10-15 NOTE — Telephone Encounter (Signed)
Patient called today because she is having trouble getting omnipod to communicate with her phone- I advised patient to call omnipod and ask them to walk her through setting it up and she agreed-

## 2017-10-18 ENCOUNTER — Other Ambulatory Visit: Payer: Self-pay

## 2017-10-18 ENCOUNTER — Telehealth: Payer: Self-pay | Admitting: Emergency Medicine

## 2017-10-18 ENCOUNTER — Telehealth: Payer: Self-pay

## 2017-10-18 NOTE — Telephone Encounter (Signed)
Is this done by [facility] or vial please advise

## 2017-10-18 NOTE — Telephone Encounter (Signed)
Please advise on dosage for this patient because it is not in her chart

## 2017-10-18 NOTE — Telephone Encounter (Signed)
But she is using omnipods right?

## 2017-10-18 NOTE — Telephone Encounter (Signed)
Insulin pump requires a vial

## 2017-10-18 NOTE — Telephone Encounter (Addendum)
Resolved

## 2017-10-18 NOTE — Telephone Encounter (Signed)
Pt called and stated she needed her Humalog called into the pharmacy. Pharmacy [facility]. Thanks.

## 2017-10-18 NOTE — Telephone Encounter (Signed)
70 units a day in the pump

## 2017-10-21 ENCOUNTER — Other Ambulatory Visit: Payer: Self-pay

## 2017-10-21 ENCOUNTER — Ambulatory Visit: Payer: Medicare Other | Admitting: Endocrinology

## 2017-10-21 ENCOUNTER — Telehealth: Payer: Self-pay | Admitting: Endocrinology

## 2017-10-21 MED ORDER — INSULIN LISPRO 100 UNIT/ML ~~LOC~~ SOLN: SUBCUTANEOUS | 3 refills | Status: DC

## 2017-10-21 NOTE — Telephone Encounter (Signed)
Refilled insulin- can you please see what day works for [employer] and [employer]

## 2017-10-21 NOTE — Telephone Encounter (Signed)
Please refill insulin, she can be seen for 15-minute visit preferably on a day when Christean Grief is available

## 2017-10-21 NOTE — Telephone Encounter (Signed)
Patient missed appt today and called to reschedule 30 min OV.  She stated she is needing her insulin for her pump. The Next available 30 min visit is not until October  Can she get the insulin for this or can there be a spot opened for her sooner?   Please advise

## 2017-10-21 NOTE — Telephone Encounter (Signed)
Please advise 

## 2017-10-22 ENCOUNTER — Other Ambulatory Visit: Payer: Self-pay | Admitting: Endocrinology

## 2017-10-22 MED ORDER — INSULIN LISPRO 100 UNIT/ML ~~LOC~~ SOLN: SUBCUTANEOUS | 3 refills | Status: DC

## 2017-10-26 NOTE — Telephone Encounter (Signed)
I am here Monday 8-12, Tues., and Wedn., 8-5.  Any time is good for me

## 2017-10-27 ENCOUNTER — Emergency Department (HOSPITAL_COMMUNITY)
Admission: EM | Admit: 2017-10-27 | Discharge: 2017-10-27 | Disposition: A | Discharge status: Home / Self Care | Payer: Medicare Other | Attending: Emergency Medicine | Admitting: Emergency Medicine

## 2017-10-27 ENCOUNTER — Encounter (HOSPITAL_COMMUNITY): Payer: Self-pay | Admitting: Emergency Medicine

## 2017-10-27 DIAGNOSIS — Z79899 Other long term (current) drug therapy: Secondary | ICD-10-CM | POA: Insufficient documentation

## 2017-10-27 DIAGNOSIS — R21 Rash and other nonspecific skin eruption: Secondary | ICD-10-CM

## 2017-10-27 DIAGNOSIS — L299 Pruritus, unspecified: Secondary | ICD-10-CM | POA: Insufficient documentation

## 2017-10-27 DIAGNOSIS — E039 Hypothyroidism, unspecified: Secondary | ICD-10-CM | POA: Diagnosis not present

## 2017-10-27 DIAGNOSIS — N183 Chronic kidney disease, stage 3 (moderate): Secondary | ICD-10-CM | POA: Diagnosis not present

## 2017-10-27 DIAGNOSIS — I129 Hypertensive chronic kidney disease with stage 1 through stage 4 chronic kidney disease, or unspecified chronic kidney disease: Secondary | ICD-10-CM | POA: Diagnosis not present

## 2017-10-27 DIAGNOSIS — F17210 Nicotine dependence, cigarettes, uncomplicated: Secondary | ICD-10-CM | POA: Diagnosis not present

## 2017-10-27 DIAGNOSIS — E119 Type 2 diabetes mellitus without complications: Secondary | ICD-10-CM | POA: Diagnosis not present

## 2017-10-27 DIAGNOSIS — Z794 Long term (current) use of insulin: Secondary | ICD-10-CM | POA: Diagnosis not present

## 2017-10-27 DIAGNOSIS — I2510 Atherosclerotic heart disease of native coronary artery without angina pectoris: Secondary | ICD-10-CM | POA: Diagnosis not present

## 2017-10-27 MED ORDER — TRIAMCINOLONE ACETONIDE 0.1 % EX CREA: 1.0000 "application " | TOPICAL_CREAM | Freq: Two times a day (BID) | CUTANEOUS | 0 refills | Status: DC

## 2017-10-27 MED ORDER — HYDROXYZINE HCL 50 MG/ML IM SOLN
50.0000 mg | Freq: Once | INTRAMUSCULAR | Status: DC
  Filled 2017-10-27: qty 1

## 2017-10-27 MED ORDER — DIPHENHYDRAMINE HCL 50 MG/ML IJ SOLN
25.0000 mg | Freq: Once | INTRAMUSCULAR | Status: AC
  Administered 2017-10-27: 25 mg via INTRAMUSCULAR
  Filled 2017-10-27: qty 1

## 2017-10-27 MED ORDER — HYDROXYZINE HCL 25 MG PO TABS: 25.0000 mg | ORAL_TABLET | Freq: Four times a day (QID) | ORAL | 0 refills | Status: DC

## 2017-10-27 NOTE — Discharge Instructions (Addendum)
This is likely an inflammatory rash.  Please use steroid cream over the rash, hydroxyzine as needed for itching.  If this rash persists you will need to follow-up with your primary care doctor.  Try to avoid scratching as much as possible, you can lose lotions to keep the area moisturized and cool compresses to help with itching.  If you develop fevers, nausea, vomiting, worsening rash, drainage from the areas on your skin or any other new or concerning symptoms you may return for reevaluation.

## 2017-10-27 NOTE — ED Notes (Signed)
Declined W/C at D/C and was escorted to lobby by RN. 

## 2017-10-27 NOTE — ED Triage Notes (Signed)
Patient to ED c/o pruritus over whole body x 1 month. Denies new medications, lotions, body washes. No other complaints.

## 2017-10-27 NOTE — ED Provider Notes (Signed)
[place] EMERGENCY DEPARTMENT Provider Note   CSN: [id number] Arrival date & time: 10/27/17  1024     History   Chief Complaint Chief Complaint  Patient presents with  . Pruritis    HPI April Branch is a 72 y.o. female.  April Branch is a 72 y.o. female with a complex medical history as listed below, who presents to the ED for evaluation of pruritic rash.  Patient reports she has had itching for an entire month.  Reports she has noticed a red raised rash over bilateral arms and legs, is not on the trunk, face and spares the palms and soles.  She has not had any fever with this rash, reports the rash is not painful only extremely itchy, she has been waking up to scratch at night this was been impacting her sleep.  She saw her primary care doctor for this rash, who recommended Benadryl and anti-itch creams, patient has been using these without improvement.  She has not noted any drainage from the rash.  It does not appear to be spreading.  No facial swelling or difficulty breathing or swelling, no shortness of breath or wheezing.  No new medications, no new household products or foods.      Past Medical History:  Diagnosis Date  . Anemia   . Anxiety   . Arthritis   . CAD (coronary artery disease) 2016   non-obstructive at cath  . Colon polyps 09/11/2010   Tubular adenoma  . COPD 12/14/2008   PATIENT DENIES    . Depression   . Diabetic coma ([place]) Feb. 2014  . Fall at home 2016   x 2 in January 2016, Left knee  . Fibromyalgia   . GERD 07/20/2006  . Headache(784.0)   . History of transient ischemic attack (TIA)   . HPV (human papilloma virus) infection   . Hyperlipidemia   . Hypertension   . Hypothyroidism   . 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 Lumbar disc disease   . Neuromuscular disorder ([place])    PERIPHERAL NEUROPATHY  . Peripheral vascular disease ([place])   . Personal history of malignant neoplasm of  kidney(V10.52) 12/14/2008   Laparoscopic biopsy and cryoablation 7/08 Dr. Vernie Shanks   . Renal disorder    chronic kidney disease   . Stroke [facility])    3 MINI STROKES  RT SIDED WEAKNESS  . Vertigo     Patient Active Problem List   Diagnosis Date Noted  . Dizziness 04/23/2017  . Hyperglycemia 10/31/2016  . Closed fracture of transverse process of lumbar vertebra ([place]) 10/31/2016  . Hematoma 10/31/2016  . Recurrent falls 10/31/2016  . Syncope 10/31/2016  . Gait abnormality 10/19/2016  . Diabetic peripheral neuropathy ([place]) 10/19/2016  . Symptomatic bradycardia 10/13/2016  . Elevated troponin   . Bradycardia 10/12/2016  . General weakness 10/12/2016  . CKD (chronic kidney disease), stage III ([place]) 10/12/2016  . Weakness   . Orthostatic syncope 05/10/2016  . Colitis 05/10/2016  . Anemia 05/10/2016  . Hypomagnesemia 05/10/2016  . Food impaction of esophagus 09/02/2015  . Diabetic foot ulcer ([place]) 06/24/2015  . 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 Carotid stenosis 05/19/2013  . Chest pain 09/21/2012  . Acute kidney injury (nontraumatic) ([place]) 09/21/2012  . Chronic pain syndrome 03/13/2012  . Acute kidney injury ([place]) 03/13/2012  . Atherosclerosis of native artery of extremity with intermittent claudication ([place]) 10/08/2011  . PVD (  peripheral vascular disease) ([place]) 07/30/2011  . CAD (coronary artery disease) 05/10/2011  . Hyperlipidemia   . Unstable angina ([place]) 05/09/2011  . CERVICAL CANCER 12/14/2008  . Personal history of malignant neoplasm of kidney(V10.52) 12/14/2008  . COPD (chronic obstructive pulmonary disease) ([place]) 12/14/2008  . IBS 12/14/2008  . Essential hypertension   . Personal history of colonic polyps 08/13/2008  . DISORDER, TOBACCO USE 08/04/2006  . Hypothyroidism   . Insulin dependent type 2 diabetes mellitus, controlled ([place])   . Lumbar disc disease   . ANXIETY STATE NOS 07/20/2006  . GERD 07/20/2006    . HX, PERSONAL, VENOUS THROMBOSIS/EMBOLISM 07/20/2006    Past Surgical History:  Procedure Laterality Date  . ABDOMINAL AORTAGRAM N/A 08/21/2011   Procedure: ABDOMINAL Maxcine Ham;  Surgeon: Elam Dutch, MD;  Location[facility] CATH LAB;  Service: Cardiovascular;  Laterality: N/A;  . ABDOMINAL AORTAGRAM N/A 03/16/2014   Procedure: ABDOMINAL Maxcine Ham;  Surgeon: Elam Dutch, MD;  Location: [facility] CATH LAB;  Service: Cardiovascular;  Laterality: N/A;  . ABDOMINAL HYSTERECTOMY    . APPENDECTOMY    . BLADDER SUSPENSION    . CARDIAC CATHETERIZATION    . CHOLECYSTECTOMY OPEN    . ESOPHAGOGASTRODUODENOSCOPY (EGD) WITH PROPOFOL N/A 05/16/2014   Procedure: ESOPHAGOGASTRODUODENOSCOPY (EGD) WITH PROPOFOL with Balloon dilation;  Surgeon: Milus Banister, MD;  Location: [place];  Service: Endoscopy;  Laterality: N/A;  . ESOPHAGOGASTRODUODENOSCOPY (EGD) WITH PROPOFOL N/A 09/02/2015   Procedure: ESOPHAGOGASTRODUODENOSCOPY (EGD) WITH PROPOFOL;  Surgeon: Doran Stabler, MD;  Location: [place];  Service: Gastroenterology;  Laterality: N/A;  . FEMORAL-POPLITEAL BYPASS GRAFT  10/12/2011   Procedure: BYPASS GRAFT FEMORAL-POPLITEAL ARTERY;  Surgeon: Elam Dutch, MD;  Location: [facility] OR;  Service: Vascular;  Laterality: Left;  Left Femoral-Popliteal Bypass Graft using 72mm x 80cm Propaten Graft with intraop arteriogram times one.  . FEMORAL-POPLITEAL BYPASS GRAFT Left 04/17/2014   Procedure: REDO LEFT FEMORAL-POPLITEAL ARTERY BYPASS USING GORE PROPATEN 17mmx80cm GRAFT;  Surgeon: Elam Dutch, MD;  Location: [place];  Service: Vascular;  Laterality: Left;  . FOOT SURGERY Left    "bone spurs"  . INTRAOPERATIVE ARTERIOGRAM  10/12/2011   Procedure: INTRA OPERATIVE ARTERIOGRAM;  Surgeon: Elam Dutch, MD;  Location: [place];  Service: Vascular;  Laterality: Left;  . KNEE ARTHROSCOPY Bilateral    "cartilage"  . Laparascopic cryoablation of left kidney  08/2006   Dr. Gladis Riffle for renal cell cancer  . LEFT  HEART CATHETERIZATION WITH CORONARY ANGIOGRAM N/A 05/11/2011   Procedure: LEFT HEART CATHETERIZATION WITH CORONARY ANGIOGRAM;  Surgeon: Jolaine Artist, MD;  Location: [facility] CATH LAB;  Service: Cardiovascular;  Laterality: N/A;  . LUNG SURGERY Right    lung nodule removed from the right side  . PR VEIN BYPASS GRAFT,AORTO-FEM-POP  05/27/2010  . SHOULDER ARTHROSCOPY Bilateral    'Spurs"  . TUBAL LIGATION       OB History   None      Home Medications    Prior to Admission medications   Medication Sig Start Date End Date Taking? Authorizing Provider  ACCU-CHEK FASTCLIX LANCETS MISC Use 4 per day to check blood sugar dx code E11.9 11/29/14   Elayne Snare, MD  cilostazol (PLETAL) 100 MG tablet TAKE 1 TABLET BY MOUTH TWICE A DAY BEFORE MEALS 09/15/17   Waynetta Sandy, MD  clopidogrel (PLAVIX) 75 MG tablet Take 75 mg by mouth daily.    [provider]  DULoxetine (CYMBALTA) 60 MG capsule Take 1 capsule (60 mg total)  by mouth daily. 09/22/17   Elayne Snare, MD  ferrous sulfate 325 (65 FE) MG tablet Take 325 mg by mouth daily with breakfast.     [provider]  glucose blood (CONTOUR NEXT TEST) test strip Use to test blood sugar 3 times daily E11.9 10/15/17   Elayne Snare, MD  HYDROcodone-acetaminophen (NORCO) 10-325 MG tablet Take 1 tablet by mouth 3 (three) times daily as needed (for pain). Follow up with PCP if refills needed 04/24/17   Aline August, MD  hydrOXYzine (ATARAX/VISTARIL) 25 MG tablet Take 1 tablet (25 mg total) by mouth every 6 (six) hours. 10/27/17   Jacqlyn Larsen, PA-C  insulin lispro (HUMALOG) 100 UNIT/ML injection Use 70 units daily in insulin pump 10/22/17   Elayne Snare, MD  Insulin Pen Needle (BD PEN NEEDLE NANO U/F) 32G X 4 MM MISC Use to inject insulin Patient not taking: Reported on 10/13/2017 10/30/14   Elayne Snare, MD  lansoprazole (PREVACID) 30 MG capsule Take 1 capsule (30 mg total) by mouth daily. 05/11/16   Geradine Girt, DO  levothyroxine  (SYNTHROID, LEVOTHROID) 175 MCG tablet Take 175 mcg by mouth daily. 04/06/17   [provider]  metoprolol succinate (TOPROL XL) 25 MG 24 hr tablet Take 1 tablet (25 mg total) by mouth daily. 04/20/17   Imogene Burn, PA-C  nortriptyline (PAMELOR) 75 MG capsule Take 75 mg by mouth at bedtime. 01/24/14   [provider]  potassium chloride (KLOR-CON 10) 10 MEQ tablet Take 1 tablet (10 mEq total) by mouth daily. 03/19/16   Elayne Snare, MD  pravastatin (PRAVACHOL) 20 MG tablet Take 20 mg by mouth daily.    [provider]  QUEtiapine (SEROQUEL) 100 MG tablet Take 100 mg by mouth at bedtime. 09/25/16   [provider]  tolterodine (DETROL LA) 4 MG 24 hr capsule Take 4 mg by mouth daily.     [provider]  triamcinolone cream (KENALOG) 0.1 % Apply 1 application topically 2 (two) times daily. 10/27/17   Jacqlyn Larsen, PA-C  Vitamin D, Ergocalciferol, (DRISDOL) 50000 units CAPS capsule Take 50,000 Units by mouth once a week. 09/18/16   [provider]    Family History Family History  Problem Relation Age of Onset  . Heart disease Mother   . Lung cancer Mother   . Cancer Mother        Lung  . Heart disease Father   . Lung cancer Father   . Cancer Father        Lung  . Heart disease Sister        CABG- Open Heart    Social History Social History   Tobacco Use  . Smoking status: Current Every Day Smoker    Packs/day: 1.50    Years: 50.00    Pack years: 75.00    Types: Cigarettes    Last attempt to quit: 06/24/2014    Years since quitting: 3.3  . Smokeless tobacco: Never Used  Substance Use Topics  . Alcohol use: No    Alcohol/week: 0.0 standard drinks  . Drug use: No     Allergies   Patient has no known allergies.   Review of Systems Review of Systems  Constitutional: Negative for chills and fever.  HENT: Negative for facial swelling and trouble swallowing.   Eyes: Negative for discharge, redness, itching and visual  disturbance.  Respiratory: Negative for shortness of breath and wheezing.   Cardiovascular: Negative for chest pain.  Gastrointestinal: Negative  for nausea and vomiting.  Musculoskeletal: Negative for arthralgias, joint swelling and myalgias.  Skin: Positive for rash.  All other systems reviewed and are negative.    Physical Exam Updated Vital Signs BP 112/60 (BP Location: Right Arm)   Pulse 67   Temp 98.8 F (37.1 C) (Oral)   Resp 18   SpO2 92%   Physical Exam  Constitutional: She appears well-developed and well-nourished. No distress.  HENT:  Head: Normocephalic and atraumatic.  Mouth/Throat: Oropharynx is clear and moist.  No facial swelling, posterior oropharynx is clear and moist, no swelling of the lips or tongue, tolerating secretions without difficulty.  Eyes: Right eye exhibits no discharge. Left eye exhibits no discharge.  Neck: Neck supple.  No stridor  Cardiovascular: Normal rate, regular rhythm, normal heart sounds and intact distal pulses.  Pulmonary/Chest: Effort normal and breath sounds normal. No respiratory distress.  Respirations equal and unlabored, patient able to speak in full sentences, lungs clear to auscultation bilaterally  Neurological: She is alert. Coordination normal.  Skin: She is not diaphoretic.  Mild erythematous rash over bilateral upper extremities with some larger patches of erythema over bilateral shins, nontender to palpation, no fluctuance or surrounding erythema or induration to suggest cellulitis.  No vesicles or pustules, no petechiae, no rash on the palms or the soles, no lesions on the face  Psychiatric: She has a normal mood and affect. Her behavior is normal.  Nursing note and vitals reviewed.    ED Treatments / Results  Labs (all labs ordered are listed, but only abnormal results are displayed) Labs Reviewed - No data to display  EKG None  Radiology No results found.  Procedures Procedures (including critical care  time)  Medications Ordered in ED Medications  diphenhydrAMINE (BENADRYL) injection 25 mg (25 mg Intramuscular Given 10/27/17 1118)     Initial Impression / Assessment and Plan / ED Course  I have reviewed the triage vital signs and the nursing notes.  Pertinent labs & imaging results that were available during my care of the patient were reviewed by me and considered in my medical decision making (see chart for details).  Rash consistent with dermatitis. Patient denies any difficulty breathing or swallowing.  Pt has a patent airway without stridor and is handling secretions without difficulty; no angioedema. No blisters, no pustules, no warmth, no draining sinus tracts, no superficial abscesses, no bullous impetigo, no vesicles, no desquamation, no target lesions with dusky purpura or a central bulla. Not tender to touch. No concern for superimposed infection. No concern for SJS, TEN, TSS, tick borne illness, syphilis or other life-threatening condition.  Patient has poorly controlled diabetes and is not a candidate for systemic steroids, will treat with Kenalog cream and hydroxyzine.  At this time there does not appear to be any evidence of an acute emergency medical condition and the patient appears stable for discharge with appropriate outpatient follow up.Diagnosis was discussed with patient who verbalizes understanding and is agreeable to discharge. Pt case discussed with Dr. Lita Mains who agrees with my plan.   Final Clinical Impressions(s) / ED Diagnoses   Final diagnoses:  Rash  Pruritus    ED Discharge Orders         Ordered    hydrOXYzine (ATARAX/VISTARIL) 25 MG tablet  Every 6 hours     10/27/17 1113    triamcinolone cream (KENALOG) 0.1 %  2 times daily     10/27/17 1113           Benedetto Goad  N, PA-C 10/27/17 1155    Julianne Rice, MD 10/28/17 1040

## 2017-11-02 ENCOUNTER — Telehealth: Payer: Self-pay

## 2017-11-02 NOTE — Telephone Encounter (Signed)
Patient needs CMN for contour next test strips she is on insulin pump- patient called [facility] and was told it was faxed to [place] please find this and call patient back when this has been done- [phone]

## 2017-11-04 NOTE — Telephone Encounter (Signed)
[facility] calling for status of the contour test strips CMN form, I have asked for it to be resent now

## 2017-11-05 ENCOUNTER — Other Ambulatory Visit: Payer: Self-pay | Admitting: Internal Medicine

## 2017-11-09 ENCOUNTER — Telehealth: Payer: Self-pay

## 2017-11-09 NOTE — Telephone Encounter (Signed)
Please get prior authorization done for contour test strips since these are used for the [employer].  In the meantime she can use another meter and entered a blood sugar readings manually in the pump.  Linda: Please help her with this.  She can call [facility] or [facility] to get the freestyle libre sensor, please give her the number

## 2017-11-09 NOTE — Telephone Encounter (Signed)
Received fax that stated Contour Next test strips are not covered. Pt WAS using the Omnipod pump. She stopped 2 months ago when her test strips ran out. She stated she was using Contour strips in her Omnipod, I asked if she was sure because normally it is Freestyle. She stated yes. She said her sugars have been extremely high she is injecting 10 units of Humalog 3 times daily "sometimes" she changes the dose based on sugar readings she says. She wants to go back on her pump but can not afford the 100 plus dollars she had been paying for the contour test strips. Patient mentioned wanting the freestyle libre sensor. Please advise she is very confused.

## 2017-11-10 ENCOUNTER — Telehealth: Payer: Self-pay | Admitting: Nutrition

## 2017-11-10 ENCOUNTER — Other Ambulatory Visit: Payer: Self-pay | Admitting: Endocrinology

## 2017-11-10 ENCOUNTER — Encounter: Payer: Medicare Other | Attending: Endocrinology | Admitting: Nutrition

## 2017-11-10 ENCOUNTER — Other Ambulatory Visit: Payer: Self-pay

## 2017-11-10 DIAGNOSIS — E1165 Type 2 diabetes mellitus with hyperglycemia: Secondary | ICD-10-CM

## 2017-11-10 DIAGNOSIS — Z794 Long term (current) use of insulin: Secondary | ICD-10-CM | POA: Insufficient documentation

## 2017-11-10 DIAGNOSIS — Z713 Dietary counseling and surveillance: Secondary | ICD-10-CM | POA: Insufficient documentation

## 2017-11-10 NOTE — Telephone Encounter (Signed)
Called patient.  Said she has no Bayer test strips and stopped using the pump.  Very confused as to what insulin she is taking.  Made an appointment with her today at [facility].  She was told to bring in her pump, meter, test strips she is using, and the insulin she is taking.  She agreed to do this.

## 2017-11-10 NOTE — Telephone Encounter (Signed)
Opened in error

## 2017-11-10 NOTE — Progress Notes (Signed)
Patient had call the office 3X and said her pharmacy sent over 3 requests test strip coverage because her insurance di not cover her Bayer test strips.  She did not have any more strips, and so stopped her pump.  She is taking only Humalog insulin before meals, and her blood sugar has been extremely high.   She was started back on her Dash pump again, and we reviewed how to give a bolus using 60 carbs for bfast, 40 for lunch and 60 for supper.  She was also given a smaller meal size dose for all three meals.  Written instructions were given for this.   She was also shown how to put in a blood sugar reading from her AccuCeck Aviva pump.  She did this correctly X 2 and written directions were given for how to do this, and Aviva test strips were called in for her to the [facility] so she will have test strips, until we can get the prior auth. For the bayer test strips.  She reported good understanding of this. She also reported that she was "treated very hatefully by the front desk when she called to see if we received the order from the pharmacy for her test strips.  She was very upset when she came in, but said she felt better when leaving her today.

## 2017-11-10 NOTE — Patient Instructions (Signed)
Review sheet with instructions for carb amounts for each meal. Review instruction sheet for instructions on how to put blood sugar readings into the pump when giving a bolus

## 2017-11-10 NOTE — Telephone Encounter (Signed)
Called in accu-check meter and supplies to requested pharmacy

## 2017-11-12 ENCOUNTER — Other Ambulatory Visit: Payer: Medicare Other

## 2017-11-16 ENCOUNTER — Ambulatory Visit: Payer: Medicare Other | Admitting: Endocrinology

## 2017-11-16 ENCOUNTER — Encounter: Payer: Medicare Other | Admitting: Nutrition

## 2017-11-16 DIAGNOSIS — E1165 Type 2 diabetes mellitus with hyperglycemia: Secondary | ICD-10-CM | POA: Diagnosis not present

## 2017-11-16 DIAGNOSIS — Z713 Dietary counseling and surveillance: Secondary | ICD-10-CM | POA: Diagnosis present

## 2017-11-16 DIAGNOSIS — E1142 Type 2 diabetes mellitus with diabetic polyneuropathy: Secondary | ICD-10-CM

## 2017-11-16 DIAGNOSIS — Z794 Long term (current) use of insulin: Secondary | ICD-10-CM | POA: Diagnosis not present

## 2017-11-18 NOTE — Progress Notes (Signed)
Patient came in complaining that when she gives a bolus, and her blood sugars are high, the pump gives her too much insulin, and she drops low.  She then does not give any insulin for meal. Discussed how the pump works again,and the need for a bolus before meals.  Changes were made to the ISF factor from 40 to 50.  She said that that was it would still be too much insulin.  It was changed to 60 and I told her that I will call her on Monday to see if it was working.  She was given a paper to write the premeal blood sugar reading, when high, and then the reading 4 hours after giving the bolus.  She agreed to do this X2

## 2017-11-18 NOTE — Patient Instructions (Signed)
Write blood sugar down before a meal that is a high reading, and do the bolus that the pump says to do.  Test again 4 hours later, without eating in between.  Do this 2 times.

## 2017-11-24 ENCOUNTER — Telehealth: Payer: Self-pay | Admitting: Endocrinology

## 2017-11-24 DIAGNOSIS — Z794 Long term (current) use of insulin: Principal | ICD-10-CM

## 2017-11-24 DIAGNOSIS — E1165 Type 2 diabetes mellitus with hyperglycemia: Secondary | ICD-10-CM

## 2017-11-24 NOTE — Telephone Encounter (Signed)
Called [phone] (home) and unable to LVM, called [phone] (mobile) and LVM to call back

## 2017-11-24 NOTE — Telephone Encounter (Signed)
She was supposed to call let us know how her blood sugars are before and 4 hours after a meal bolus.  Need to know he has done this and also her fasting blood sugars are normal. Also need to schedule labs before her next visit with me on the 16th

## 2017-12-01 ENCOUNTER — Telehealth: Payer: Self-pay | Admitting: Endocrinology

## 2017-12-01 NOTE — Telephone Encounter (Signed)
Patient is returning call. Please Advise. °

## 2017-12-03 NOTE — Telephone Encounter (Signed)
Called pt --stated sugar reading 200 before meals and 250 after 4 hours....the patient will make an lab appt before seing Dr. [name] .Marland KitchenMarland Kitchen

## 2017-12-03 NOTE — Telephone Encounter (Signed)
Pt will make an lab appt before seeing  Dr. [name] on 12/09/18

## 2017-12-05 NOTE — Telephone Encounter (Signed)
If she is able to change her basal rate she needs to increase the basal rate at 8 AM up to 1.8 otherwise we will do it in the office

## 2017-12-06 ENCOUNTER — Other Ambulatory Visit: Payer: Medicare Other

## 2017-12-06 NOTE — Telephone Encounter (Signed)
Called pt stated ---do not know how to change the basal rate--and I told her will assist/do it in the office by Dr. Dwyane Branch...the patient appt 01/13/18

## 2017-12-07 ENCOUNTER — Other Ambulatory Visit: Payer: Self-pay | Admitting: Endocrinology

## 2017-12-07 DIAGNOSIS — E1165 Type 2 diabetes mellitus with hyperglycemia: Secondary | ICD-10-CM

## 2017-12-07 DIAGNOSIS — Z794 Long term (current) use of insulin: Principal | ICD-10-CM

## 2017-12-07 NOTE — Telephone Encounter (Signed)
Please schedule her to be seen by Christean Grief.  At the first available appointment

## 2017-12-08 ENCOUNTER — Ambulatory Visit: Payer: Medicare Other | Admitting: Endocrinology

## 2017-12-08 ENCOUNTER — Encounter: Payer: Medicare Other | Admitting: Nutrition

## 2017-12-09 ENCOUNTER — Other Ambulatory Visit: Payer: Self-pay | Admitting: Endocrinology

## 2017-12-10 ENCOUNTER — Emergency Department (HOSPITAL_COMMUNITY)
Admission: EM | Admit: 2017-12-10 | Discharge: 2017-12-10 | Disposition: A | Discharge status: Home / Self Care | Payer: Medicare Other | Attending: Emergency Medicine | Admitting: Emergency Medicine

## 2017-12-10 ENCOUNTER — Emergency Department (HOSPITAL_COMMUNITY): Payer: Medicare Other

## 2017-12-10 ENCOUNTER — Encounter (HOSPITAL_COMMUNITY): Payer: Self-pay | Admitting: *Deleted

## 2017-12-10 DIAGNOSIS — R059 Cough, unspecified: Secondary | ICD-10-CM

## 2017-12-10 DIAGNOSIS — Z794 Long term (current) use of insulin: Secondary | ICD-10-CM | POA: Diagnosis not present

## 2017-12-10 DIAGNOSIS — Z8673 Personal history of transient ischemic attack (TIA), and cerebral infarction without residual deficits: Secondary | ICD-10-CM | POA: Diagnosis not present

## 2017-12-10 DIAGNOSIS — F17210 Nicotine dependence, cigarettes, uncomplicated: Secondary | ICD-10-CM | POA: Diagnosis not present

## 2017-12-10 DIAGNOSIS — J449 Chronic obstructive pulmonary disease, unspecified: Secondary | ICD-10-CM | POA: Insufficient documentation

## 2017-12-10 DIAGNOSIS — N183 Chronic kidney disease, stage 3 (moderate): Secondary | ICD-10-CM | POA: Diagnosis not present

## 2017-12-10 DIAGNOSIS — I2510 Atherosclerotic heart disease of native coronary artery without angina pectoris: Secondary | ICD-10-CM | POA: Insufficient documentation

## 2017-12-10 DIAGNOSIS — I129 Hypertensive chronic kidney disease with stage 1 through stage 4 chronic kidney disease, or unspecified chronic kidney disease: Secondary | ICD-10-CM | POA: Insufficient documentation

## 2017-12-10 DIAGNOSIS — Z79899 Other long term (current) drug therapy: Secondary | ICD-10-CM | POA: Insufficient documentation

## 2017-12-10 DIAGNOSIS — Z7902 Long term (current) use of antithrombotics/antiplatelets: Secondary | ICD-10-CM | POA: Diagnosis not present

## 2017-12-10 DIAGNOSIS — E785 Hyperlipidemia, unspecified: Secondary | ICD-10-CM | POA: Diagnosis not present

## 2017-12-10 DIAGNOSIS — E039 Hypothyroidism, unspecified: Secondary | ICD-10-CM | POA: Insufficient documentation

## 2017-12-10 DIAGNOSIS — E119 Type 2 diabetes mellitus without complications: Secondary | ICD-10-CM | POA: Insufficient documentation

## 2017-12-10 DIAGNOSIS — R05 Cough: Secondary | ICD-10-CM | POA: Insufficient documentation

## 2017-12-10 DIAGNOSIS — Z760 Encounter for issue of repeat prescription: Secondary | ICD-10-CM | POA: Insufficient documentation

## 2017-12-10 MED ORDER — AEROCHAMBER PLUS FLO-VU SMALL MISC
1.0000 | Freq: Once | Status: AC
  Administered 2017-12-10: 1
  Filled 2017-12-10: qty 1

## 2017-12-10 MED ORDER — ALBUTEROL SULFATE HFA 108 (90 BASE) MCG/ACT IN AERS
1.0000 | INHALATION_SPRAY | RESPIRATORY_TRACT | Status: DC
  Administered 2017-12-10: 1 via RESPIRATORY_TRACT
  Filled 2017-12-10: qty 6.7

## 2017-12-10 MED ORDER — DOXYCYCLINE HYCLATE 100 MG PO CAPS: 100.0000 mg | ORAL_CAPSULE | Freq: Two times a day (BID) | ORAL | 0 refills | Status: DC

## 2017-12-10 MED ORDER — ALBUTEROL SULFATE HFA 108 (90 BASE) MCG/ACT IN AERS: 1.0000 | INHALATION_SPRAY | Freq: Four times a day (QID) | RESPIRATORY_TRACT | 0 refills | Status: DC | PRN

## 2017-12-10 MED ORDER — TRIAMCINOLONE ACETONIDE 0.1 % EX CREA: 1.0000 "application " | TOPICAL_CREAM | Freq: Two times a day (BID) | CUTANEOUS | 0 refills | Status: AC

## 2017-12-10 NOTE — ED Provider Notes (Signed)
[place] EMERGENCY DEPARTMENT Provider Note   CSN: [id number] Arrival date & time: 12/10/17  1128     History   Chief Complaint Chief Complaint  Patient presents with  . Cough  . Pruritis    HPI April Branch is a 72 y.o. female here for evaluation of cough onset 8 days ago. Persistent, worsening, non productive, disrupting her sleep. States during the night she coughs so much it takes her breath away.  Associated with subjective fever, chills yesterday, scratchy throat, nasal congestion.  Has taken mucinex without relief.  She continues to smoke cigarettes. No alleviating factors. No known sick contacts. Denies exertional Cp or SOB, hemoptysis, nausea, vomiting, abdominal pain.  She is requesting refill on triamcinolone cream. Long time daily smoker without diagnosed COPD/emphysema. Does not use inhaler at home or supplemental oxygen.   HPI  Past Medical History:  Diagnosis Date  . Anemia   . Anxiety   . Arthritis   . CAD (coronary artery disease) 2016   non-obstructive at cath  . Colon polyps 09/11/2010   Tubular adenoma  . COPD 12/14/2008   PATIENT DENIES    . Depression   . Diabetic coma ([place]) Feb. 2014  . Fall at home 2016   x 2 in January 2016, Left knee  . Fibromyalgia   . GERD 07/20/2006  . Headache(784.0)   . History of transient ischemic attack (TIA)   . HPV (human papilloma virus) infection   . Hyperlipidemia   . Hypertension   . Hypothyroidism   . 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 Lumbar disc disease   . Neuromuscular disorder ([place])    PERIPHERAL NEUROPATHY  . Peripheral vascular disease ([place])   . Personal history of malignant neoplasm of kidney(V10.52) 12/14/2008   Laparoscopic biopsy and cryoablation 7/08 Dr. Vernie Shanks   . Renal disorder    chronic kidney disease   . Stroke [facility])    3 MINI STROKES  RT SIDED WEAKNESS  . Vertigo     Patient Active Problem List   Diagnosis Date Noted    . Dizziness 04/23/2017  . Hyperglycemia 10/31/2016  . Closed fracture of transverse process of lumbar vertebra ([place]) 10/31/2016  . Hematoma 10/31/2016  . Recurrent falls 10/31/2016  . Syncope 10/31/2016  . Gait abnormality 10/19/2016  . Diabetic peripheral neuropathy ([place]) 10/19/2016  . Symptomatic bradycardia 10/13/2016  . Elevated troponin   . Bradycardia 10/12/2016  . General weakness 10/12/2016  . CKD (chronic kidney disease), stage III ([place]) 10/12/2016  . Weakness   . Orthostatic syncope 05/10/2016  . Colitis 05/10/2016  . Anemia 05/10/2016  . Hypomagnesemia 05/10/2016  . Food impaction of esophagus 09/02/2015  . Diabetic foot ulcer ([place]) 06/24/2015  . 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 Carotid stenosis 05/19/2013  . Chest pain 09/21/2012  . Acute kidney injury (nontraumatic) ([place]) 09/21/2012  . Chronic pain syndrome 03/13/2012  . Acute kidney injury ([place]) 03/13/2012  . Atherosclerosis of native artery of extremity with intermittent claudication ([place]) 10/08/2011  . PVD (peripheral vascular disease) ([place]) 07/30/2011  . CAD (coronary artery disease) 05/10/2011  . Hyperlipidemia   . Unstable angina ([place]) 05/09/2011  . CERVICAL CANCER 12/14/2008  . Personal history of malignant neoplasm of kidney(V10.52) 12/14/2008  . COPD (chronic obstructive pulmonary disease) ([place]) 12/14/2008  . IBS 12/14/2008  . Essential hypertension   . Personal history of colonic polyps 08/13/2008  . DISORDER, TOBACCO USE  08/04/2006  . Hypothyroidism   . Insulin dependent type 2 diabetes mellitus, controlled ([place])   . Lumbar disc disease   . ANXIETY STATE NOS 07/20/2006  . GERD 07/20/2006  . HX, PERSONAL, VENOUS THROMBOSIS/EMBOLISM 07/20/2006    Past Surgical History:  Procedure Laterality Date  . ABDOMINAL AORTAGRAM N/A 08/21/2011   Procedure: ABDOMINAL Maxcine Ham;  Surgeon: Elam Dutch, MD;  Location: [facility] CATH LAB;  Service:  Cardiovascular;  Laterality: N/A;  . ABDOMINAL AORTAGRAM N/A 03/16/2014   Procedure: ABDOMINAL Maxcine Ham;  Surgeon: Elam Dutch, MD;  Location: [facility] CATH LAB;  Service: Cardiovascular;  Laterality: N/A;  . ABDOMINAL HYSTERECTOMY    . APPENDECTOMY    . BLADDER SUSPENSION    . CARDIAC CATHETERIZATION    . CHOLECYSTECTOMY OPEN    . ESOPHAGOGASTRODUODENOSCOPY (EGD) WITH PROPOFOL N/A 05/16/2014   Procedure: ESOPHAGOGASTRODUODENOSCOPY (EGD) WITH PROPOFOL with Balloon dilation;  Surgeon: Milus Banister, MD;  Location: [place];  Service: Endoscopy;  Laterality: N/A;  . ESOPHAGOGASTRODUODENOSCOPY (EGD) WITH PROPOFOL N/A 09/02/2015   Procedure: ESOPHAGOGASTRODUODENOSCOPY (EGD) WITH PROPOFOL;  Surgeon: Doran Stabler, MD;  Location: [place];  Service: Gastroenterology;  Laterality: N/A;  . FEMORAL-POPLITEAL BYPASS GRAFT  10/12/2011   Procedure: BYPASS GRAFT FEMORAL-POPLITEAL ARTERY;  Surgeon: Elam Dutch, MD;  Location: [facility] OR;  Service: Vascular;  Laterality: Left;  Left Femoral-Popliteal Bypass Graft using 66mm x 80cm Propaten Graft with intraop arteriogram times one.  . FEMORAL-POPLITEAL BYPASS GRAFT Left 04/17/2014   Procedure: REDO LEFT FEMORAL-POPLITEAL ARTERY BYPASS USING GORE PROPATEN 31mmx80cm GRAFT;  Surgeon: Elam Dutch, MD;  Location: [place];  Service: Vascular;  Laterality: Left;  . FOOT SURGERY Left    "bone spurs"  . INTRAOPERATIVE ARTERIOGRAM  10/12/2011   Procedure: INTRA OPERATIVE ARTERIOGRAM;  Surgeon: Elam Dutch, MD;  Location: [place];  Service: Vascular;  Laterality: Left;  . KNEE ARTHROSCOPY Bilateral    "cartilage"  . Laparascopic cryoablation of left kidney  08/2006   Dr. Gladis Riffle for renal cell cancer  . LEFT HEART CATHETERIZATION WITH CORONARY ANGIOGRAM N/A 05/11/2011   Procedure: LEFT HEART CATHETERIZATION WITH CORONARY ANGIOGRAM;  Surgeon: Jolaine Artist, MD;  Location: [facility] CATH LAB;  Service: Cardiovascular;  Laterality: N/A;  . LUNG SURGERY Right      lung nodule removed from the right side  . PR VEIN BYPASS GRAFT,AORTO-FEM-POP  05/27/2010  . SHOULDER ARTHROSCOPY Bilateral    'Spurs"  . TUBAL LIGATION       OB History   None      Home Medications    Prior to Admission medications   Medication Sig Start Date End Date Taking? Authorizing Provider  ACCU-CHEK FASTCLIX LANCETS MISC Use 4 per day to check blood sugar dx code E11.9 11/29/14   Elayne Snare, MD  albuterol (PROVENTIL HFA;VENTOLIN HFA) 108 (90 Base) MCG/ACT inhaler Inhale 1-2 puffs into the lungs every 6 (six) hours as needed for wheezing or shortness of breath. 12/10/17   Kinnie Feil, PA-C  cilostazol (PLETAL) 100 MG tablet TAKE 1 TABLET BY MOUTH TWICE A DAY BEFORE MEALS 09/15/17   Waynetta Sandy, MD  clopidogrel (PLAVIX) 75 MG tablet Take 75 mg by mouth daily.    [provider]  doxycycline (VIBRAMYCIN) 100 MG capsule Take 1 capsule (100 mg total) by mouth 2 (two) times daily. 12/10/17   Kinnie Feil, PA-C  DULoxetine (CYMBALTA) 60 MG capsule Take 1 capsule (60 mg total) by mouth daily. 09/22/17   Elayne Snare,  MD  ferrous sulfate 325 (65 FE) MG tablet Take 325 mg by mouth daily with breakfast.     [provider]  glucose blood (CONTOUR NEXT TEST) test strip Use to test blood sugar 3 times daily E11.9 10/15/17   Elayne Snare, MD  HYDROcodone-acetaminophen (NORCO) 10-325 MG tablet Take 1 tablet by mouth 3 (three) times daily as needed (for pain). Follow up with PCP if refills needed 04/24/17   Aline August, MD  hydrOXYzine (ATARAX/VISTARIL) 25 MG tablet Take 1 tablet (25 mg total) by mouth every 6 (six) hours. 10/27/17   Jacqlyn Larsen, PA-C  insulin lispro (HUMALOG) 100 UNIT/ML injection Use 70 units daily in insulin pump 10/22/17   Elayne Snare, MD  Insulin Pen Needle (BD PEN NEEDLE NANO U/F) 32G X 4 MM MISC Use to inject insulin Patient not taking: Reported on 10/13/2017 10/30/14   Elayne Snare, MD  lansoprazole (PREVACID) 30 MG capsule Take 1  capsule (30 mg total) by mouth daily. 05/11/16   Geradine Girt, DO  levothyroxine (SYNTHROID, LEVOTHROID) 175 MCG tablet Take 175 mcg by mouth daily. 04/06/17   [provider]  metoprolol succinate (TOPROL XL) 25 MG 24 hr tablet Take 1 tablet (25 mg total) by mouth daily. 04/20/17   Imogene Burn, PA-C  nortriptyline (PAMELOR) 75 MG capsule Take 75 mg by mouth at bedtime. 01/24/14   [provider]  potassium chloride (KLOR-CON 10) 10 MEQ tablet Take 1 tablet (10 mEq total) by mouth daily. 03/19/16   Elayne Snare, MD  pravastatin (PRAVACHOL) 20 MG tablet Take 20 mg by mouth daily.    [provider]  QUEtiapine (SEROQUEL) 100 MG tablet Take 100 mg by mouth at bedtime. 09/25/16   [provider]  tolterodine (DETROL LA) 4 MG 24 hr capsule Take 4 mg by mouth daily.     [provider]  triamcinolone cream (KENALOG) 0.1 % Apply 1 application topically 2 (two) times daily. 12/10/17   Kinnie Feil, PA-C  Vitamin D, Ergocalciferol, (DRISDOL) 50000 units CAPS capsule Take 50,000 Units by mouth once a week. 09/18/16   [provider]    Family History Family History  Problem Relation Age of Onset  . Heart disease Mother   . Lung cancer Mother   . Cancer Mother        Lung  . Heart disease Father   . Lung cancer Father   . Cancer Father        Lung  . Heart disease Sister        CABG- Open Heart    Social History Social History   Tobacco Use  . Smoking status: Current Every Day Smoker    Packs/day: 1.50    Years: 50.00    Pack years: 75.00    Types: Cigarettes    Last attempt to quit: 06/24/2014    Years since quitting: 3.4  . Smokeless tobacco: Never Used  Substance Use Topics  . Alcohol use: No    Alcohol/week: 0.0 standard drinks  . Drug use: No     Allergies   Patient has no known allergies.   Review of Systems Review of Systems  Constitutional: Positive for chills and fever.  Respiratory: Positive for cough.     All other systems reviewed and are negative.    Physical Exam Updated Vital Signs BP (!) 125/53 (BP Location: Right Arm)   Pulse 91   Temp 98.8 F (37.1 C) (Oral)   Resp 16  SpO2 97%   Physical Exam  Constitutional: She is oriented to person, place, and time. She appears well-developed and well-nourished. No distress.  NAD.  HENT:  Head: Normocephalic and atraumatic.  Right Ear: External ear normal.  Left Ear: External ear normal.  Nose: Nose normal.  Oropharynx and tonsils normal.  No mucosal edema.  TMs normal  Eyes: Conjunctivae and EOM are normal. No scleral icterus.  Neck: Normal range of motion. Neck supple.  No cervical lymphadenopathy   Cardiovascular: Normal rate, regular rhythm and normal heart sounds.  No murmur heard. Pulmonary/Chest: Effort normal. She has wheezes.  End expiratory wheezing in lower lobes bilaterally. Normal work of breathing. Dry cough during exam.   Musculoskeletal: Normal range of motion. She exhibits no deformity.  Neurological: She is alert and oriented to person, place, and time.  Skin: Skin is warm and dry. Capillary refill takes less than 2 seconds.  Psychiatric: She has a normal mood and affect. Her behavior is normal. Judgment and thought content normal.  Nursing note and vitals reviewed.    ED Treatments / Results  Labs (all labs ordered are listed, but only abnormal results are displayed) Labs Reviewed - No data to display  EKG None  Radiology Dg Chest 2 View  Result Date: 12/10/2017 CLINICAL DATA:  Cough for 2 days EXAM: CHEST - 2 VIEW COMPARISON:  04/23/17 FINDINGS: Cardiac shadow is stable. Aortic calcifications are again seen. Mild stable areas of scarring are noted in the perihilar regions bilaterally. No focal infiltrate or sizable effusion is noted. No acute bony abnormality is seen. Degenerative changes of the thoracic spine are noted. IMPRESSION: Chronic scarring.  No acute abnormality seen. Electronically Signed    By: Inez Catalina M.D.   On: 12/10/2017 12:03    Procedures Procedures (including critical care time)  Medications Ordered in ED Medications  albuterol (PROVENTIL HFA;VENTOLIN HFA) 108 (90 Base) MCG/ACT inhaler 1 puff (1 puff Inhalation Given 12/10/17 1234)  AEROCHAMBER PLUS FLO-VU SMALL device MISC 1 each (1 each Other Given 12/10/17 1236)     Initial Impression / Assessment and Plan / ED Course  I have reviewed the triage vital signs and the nursing notes.  Pertinent labs & imaging results that were available during my care of the patient were reviewed by me and considered in my medical decision making (see chart for details).     Pt presents with with URI sxs. H/o tobacco abuse. Never been diagnosed with COPD/emphysema.  On exam, pt is non toxic appearing with normal breathing effort. No fever, no tachypnea, no tachycardia, normal oxygen saturations. Only faint expiratory wheezing. She is denying exertional CP or SOB. CXR normal.  Likely viral URI vs post viral cough however given h/o tobacco use with duration of > 7 days feel it is reasonable to tx with abx, albuterol inhaler, antitussive.  She is a diabetic with insulin pump, will defer prednisone. ED return precautions given. Patient is aware of s/s that would warrant return to ED for further reevaluation. Pt ambulated with pulse ox within normal limits prior to discharge.    Final Clinical Impressions(s) / ED Diagnoses   Final diagnoses:  Cough  Medication refill    ED Discharge Orders         Ordered    triamcinolone cream (KENALOG) 0.1 %  2 times daily     12/10/17 1252    doxycycline (VIBRAMYCIN) 100 MG capsule  2 times daily     12/10/17 1252    albuterol (  PROVENTIL HFA;VENTOLIN HFA) 108 (90 Base) MCG/ACT inhaler  Every 6 hours PRN     12/10/17 1252           Arlean Hopping 12/10/17 1252    Mesner, Corene Cornea, MD 12/11/17 [phone]

## 2017-12-10 NOTE — ED Notes (Signed)
Pt says she was seen by her dr yesterday but he did not give her anything for the cough,

## 2017-12-10 NOTE — Discharge Instructions (Addendum)
You were seen in the ER for cough.   Chest x-ray was negative however given duration of symptoms greater than 1 week we will treat you with antibiotics.   Take doxycyline as prescribed. Additionally use albuterol inhaler which will help with breathing, cough, wheezing.   Stay well-hydrated. Rest. You can use over the counter medications to help with symptoms: 600 mg ibuprofen (motrin, aleve, advil) or acetaminophen (tylenol) every 6 hours, around the clock to help with associated fevers, sore throat, headaches, generalized body aches and malaise.  Oxymetazoline (afrin) intranasal spray once daily for no more than 3 days to help with congestion, after 3 days you can switch to another over-the-counter nasal steroid spray such as fluticasone (flonase) Allergy medication (loratadine, cetirizine, etc) and phenylephrine (sudafed) help with nasal congestion, runny nose and postnasal drip.   Dextromethorphan (Delsym) to suppress cough without mucus Guaifenesin (mucinex) to help with built up mucus in chest and productive cough Wash your hands often to prevent spread.   Return if symptoms worsen, there is fever greater than 100.23F, chest pain, shortness of breath

## 2017-12-10 NOTE — ED Notes (Addendum)
Pt transported to xray 

## 2017-12-10 NOTE — ED Triage Notes (Signed)
Pt in c/o generalized itching and cough for the last few days, denies fever, cough is productive, no rash

## 2018-01-07 ENCOUNTER — Telehealth: Payer: Self-pay | Admitting: Endocrinology

## 2018-01-07 NOTE — Telephone Encounter (Signed)
[facility] ph# [phone] called re: patient requested pharmacy supply test strips and lancets. Pharmacy does not have any RX or information for patient's request. Please call Pharmacy or Patient if questions or advice.

## 2018-01-07 NOTE — Telephone Encounter (Signed)
Called pt --stated called denied [place] requesting for Rx due to test strip are too expensive $100+ for 50 test strips.

## 2018-01-11 ENCOUNTER — Other Ambulatory Visit: Payer: Medicare Other

## 2018-01-12 ENCOUNTER — Other Ambulatory Visit (INDEPENDENT_AMBULATORY_CARE_PROVIDER_SITE_OTHER): Payer: Medicare Other

## 2018-01-12 DIAGNOSIS — Z794 Long term (current) use of insulin: Secondary | ICD-10-CM

## 2018-01-12 DIAGNOSIS — E1165 Type 2 diabetes mellitus with hyperglycemia: Secondary | ICD-10-CM | POA: Diagnosis not present

## 2018-01-12 DIAGNOSIS — E038 Other specified hypothyroidism: Secondary | ICD-10-CM | POA: Diagnosis not present

## 2018-01-12 LAB — COMPREHENSIVE METABOLIC PANEL
ALBUMIN: 3.4 g/dL — AB (ref 3.5–5.2)
ALT: 11 U/L (ref 0–35)
AST: 13 U/L (ref 0–37)
Alkaline Phosphatase: 111 U/L (ref 39–117)
BUN: 19 mg/dL (ref 6–23)
CHLORIDE: 104 meq/L (ref 96–112)
CO2: 26 mEq/L (ref 19–32)
Calcium: 8.9 mg/dL (ref 8.4–10.5)
Creatinine, Ser: 1.35 mg/dL — ABNORMAL HIGH (ref 0.40–1.20)
GFR: 41.15 mL/min — ABNORMAL LOW (ref >60.00)
Glucose, Bld: 249 mg/dL — ABNORMAL HIGH (ref 70–99)
Potassium: 3.6 mEq/L (ref 3.5–5.1)
SODIUM: 138 meq/L (ref 135–145)
Total Bilirubin: 0.2 mg/dL (ref 0.2–1.2)
Total Protein: 7 g/dL (ref 6.0–8.3)

## 2018-01-12 LAB — TSH: TSH: 0.02 u[IU]/mL — ABNORMAL LOW (ref 0.35–4.50)

## 2018-01-12 LAB — HEMOGLOBIN A1C: Hgb A1c MFr Bld: 7.8 % — ABNORMAL HIGH (ref 4.6–6.5)

## 2018-01-13 ENCOUNTER — Encounter: Payer: Self-pay | Admitting: Endocrinology

## 2018-01-13 ENCOUNTER — Encounter: Payer: Medicare Other | Admitting: Endocrinology

## 2018-01-13 LAB — FRUCTOSAMINE: Fructosamine: 221 umol/L (ref 0–285)

## 2018-01-13 NOTE — Progress Notes (Signed)
This encounter was created in error - please disregard.

## 2018-01-14 ENCOUNTER — Other Ambulatory Visit: Payer: Self-pay | Admitting: Endocrinology

## 2018-01-14 ENCOUNTER — Ambulatory Visit (INDEPENDENT_AMBULATORY_CARE_PROVIDER_SITE_OTHER): Payer: Medicare Other | Admitting: Endocrinology

## 2018-01-14 ENCOUNTER — Encounter: Payer: Self-pay | Admitting: Endocrinology

## 2018-01-14 VITALS — BP 122/76 | HR 88 | Ht 61.0 in | Wt 156.0 lb

## 2018-01-14 DIAGNOSIS — E1143 Type 2 diabetes mellitus with diabetic autonomic (poly)neuropathy: Secondary | ICD-10-CM

## 2018-01-14 DIAGNOSIS — E1165 Type 2 diabetes mellitus with hyperglycemia: Secondary | ICD-10-CM

## 2018-01-14 DIAGNOSIS — I951 Orthostatic hypotension: Secondary | ICD-10-CM

## 2018-01-14 DIAGNOSIS — E038 Other specified hypothyroidism: Secondary | ICD-10-CM | POA: Diagnosis not present

## 2018-01-14 DIAGNOSIS — Z794 Long term (current) use of insulin: Secondary | ICD-10-CM

## 2018-01-14 MED ORDER — FLUDROCORTISONE ACETATE 0.1 MG PO TABS: ORAL_TABLET | ORAL | 2 refills | Status: DC

## 2018-01-14 NOTE — Progress Notes (Signed)
Patient ID: April Branch, female   DOB: [birth year], 72 y.o.   MRN: [id number]           Reason for Appointment: Follow-up  for Type 2 Diabetes  Referring physician: Pattricia Boss  History of Present Illness:          Date of diagnosis of type 2 diabetes mellitus: 1980s       Previous history:    She thinks she was treated with metformin for some time before she went on insulin in ?  2002   She has mostly been treated with Lantus and rapid acting insulin such as Apidra or Novolog.   She thinks that Apidra worked fairly fast but insurance did not cover this, generally has had poor control   On her initial consultation she was switched from Lantus to [employer] because of blood sugars mostly over 300  Was also started on metformin ER 750 mg, 2 tablets daily initially and  in 3/17 she was started on Invokana 100 mg daily because of  poor control  Recent history:   INSULIN regimen: OMNIPOD pump using Humalog U-100 insulin  Current settings basal rate: Midnight = 1.45, a.m. = 1.6 units/h, boluses, presets for units breakfast, 4 units lunch and 4-6 at dinnertime Recent basal daily dose 37.2 units  Her A1c had gone up significantly to 9.7 in 6/19 and now it is down to 7.8   Current blood sugar patterns and problems identified:  Her overnight basal rate was reduced on her last visit but she still occasionally waking up with low sugars, twice in the last month  She is however checking her blood sugars erratically and mostly in the mornings and late evening  FASTING readings are variable but mostly near normal in the last 2 weeks or so  Frequently not taking her pump controller with her to her dinner which is mostly eating out in the evening  She will then bolus after eating  Also only rarely bolusing at breakfast and lunch recently despite reminders to bolus consistently, however sometimes skipping lunch  She may not be bolusing in the morning because frequently blood sugars may be  normal  She is having fairly consistently HIGH readings in the evenings, highest 410  Occasionally blood sugars are high midday but generally not checked  Recently not gaining any weight despite better control  Previously was on Invokana but this has not been continued because of renal dysfunction  Oral hypoglycemic drugs the patient is taking are: None  Side effects from medications have been: none  Compliance with the medical regimen: Inconsistent    Glucose monitoring:  done 1-3 times a day         Glucometer: Accucheck/Freestyle      Blood Glucose readings by monitor download:  Average blood sugar on her pump 233, not evaluated on her Accu-Chek meter  PRE-MEAL Fasting Lunch  afternoon Bedtime Overall  Glucose range:  54-228  165  146-354    Mean/median:        POST-MEAL PC Breakfast PC Lunch PC Dinner  Glucose range:    141-410  Mean/median:        Self-care: The diet that the patient has been following is: tries to limit  High-fat foods.      Typical meal intake: Breakfast is variable at 11 AM, frequently no lunch, dinner 7 pm              Dietician visit, most recent: never  Exercise: Unable to do much  Weight history:  Highest 199   Wt Readings from Last 3 Encounters:  01/14/18 156 lb (70.8 kg)  01/13/18 157 lb (71.2 kg)  10/14/17 155 lb 12.8 oz (70.7 kg)    Glycemic control:   Lab Results  Component Value Date   HGBA1C 7.8 (H) 01/12/2018   HGBA1C 9.7 (A) 08/17/2017   HGBA1C 9.2 (H) 10/31/2016   Lab Results  Component Value Date   MICROALBUR 1.8 08/17/2017   [place] Comment 04/20/2017   CREATININE 1.35 (H) 01/12/2018    Lab on 01/12/2018  Component Date Value Ref Range Status  . Hgb A1c MFr Bld 01/12/2018 7.8* 4.6 - 6.5 % Final   Glycemic Control Guidelines for People with Diabetes:Non Diabetic:  <6%Goal of Therapy: <7%Additional Action Suggested:  >8%   . TSH 01/12/2018 0.02* 0.35 - 4.50 uIU/mL Final  . Fructosamine 01/12/2018  221  0 - 285 umol/L Final   Comment: Published reference interval for apparently healthy subjects between age 78 and 20 is 59 - 285 umol/L and in a poorly controlled diabetic population is 228 - 563 umol/L with a mean of 396 umol/L.   Marland Kitchen Sodium 01/12/2018 138  135 - 145 mEq/L Final  . Potassium 01/12/2018 3.6  3.5 - 5.1 mEq/L Final  . Chloride 01/12/2018 104  96 - 112 mEq/L Final  . CO2 01/12/2018 26  19 - 32 mEq/L Final  . Glucose, Bld 01/12/2018 249* 70 - 99 mg/dL Final  . BUN 01/12/2018 19  6 - 23 mg/dL Final  . Creatinine, Ser 01/12/2018 1.35* 0.40 - 1.20 mg/dL Final  . Total Bilirubin 01/12/2018 0.2  0.2 - 1.2 mg/dL Final  . Alkaline Phosphatase 01/12/2018 111  39 - 117 U/L Final  . AST 01/12/2018 13  0 - 37 U/L Final  . ALT 01/12/2018 11  0 - 35 U/L Final  . Total Protein 01/12/2018 7.0  6.0 - 8.3 g/dL Final  . Albumin 01/12/2018 3.4* 3.5 - 5.2 g/dL Final  . Calcium 01/12/2018 8.9  8.4 - 10.5 mg/dL Final  . GFR 01/12/2018 41.15* >60.00 mL/min Final    OTHER active problems: See review of systems    Allergies as of 2020/08/217   No Known Allergies     Medication List        Accurate as of 01/14/18 11:59 PM. Always use your most recent med list.          ACCU-CHEK FASTCLIX LANCETS Misc Use 4 per day to check blood sugar dx code E11.9   albuterol 108 (90 Base) MCG/ACT inhaler Commonly known as:  PROVENTIL HFA;VENTOLIN HFA Inhale 1-2 puffs into the lungs every 6 (six) hours as needed for wheezing or shortness of breath.   cilostazol 100 MG tablet Commonly known as:  PLETAL TAKE 1 TABLET BY MOUTH TWICE A DAY BEFORE MEALS   clopidogrel 75 MG tablet Commonly known as:  PLAVIX Take 75 mg by mouth daily.   doxycycline 100 MG capsule Commonly known as:  VIBRAMYCIN Take 1 capsule (100 mg total) by mouth 2 (two) times daily.   DULoxetine 60 MG capsule Commonly known as:  CYMBALTA Take 1 capsule (60 mg total) by mouth daily.   ferrous sulfate 325 (65 FE) MG  tablet Take 325 mg by mouth daily with breakfast.   fludrocortisone 0.1 MG tablet Commonly known as:  FLORINEF 1 tablet 3 times a week   glucose blood test strip Use to test blood sugar 3 times daily  E11.9   HYDROcodone-acetaminophen 10-325 MG tablet Commonly known as:  NORCO Take 1 tablet by mouth 3 (three) times daily as needed (for pain). Follow up with PCP if refills needed   hydrOXYzine 25 MG tablet Commonly known as:  ATARAX/VISTARIL Take 1 tablet (25 mg total) by mouth every 6 (six) hours.   insulin lispro 100 UNIT/ML injection Commonly known as:  HUMALOG Use 70 units daily in insulin pump   Insulin Pen Needle 32G X 4 MM Misc Use to inject insulin   lansoprazole 30 MG capsule Commonly known as:  PREVACID Take 1 capsule (30 mg total) by mouth daily.   metoprolol succinate 25 MG 24 hr tablet Commonly known as:  TOPROL-XL Take 1 tablet (25 mg total) by mouth daily.   nortriptyline 75 MG capsule Commonly known as:  PAMELOR Take 75 mg by mouth at bedtime.   potassium chloride 10 MEQ tablet Commonly known as:  K-DUR Take 1 tablet (10 mEq total) by mouth daily.   pravastatin 20 MG tablet Commonly known as:  PRAVACHOL Take 20 mg by mouth daily.   QUEtiapine 100 MG tablet Commonly known as:  SEROQUEL Take 100 mg by mouth at bedtime.   tolterodine 4 MG 24 hr capsule Commonly known as:  DETROL LA Take 4 mg by mouth daily.   triamcinolone cream 0.1 % Commonly known as:  KENALOG Apply 1 application topically 2 (two) times daily.   Vitamin D (Ergocalciferol) 1.25 MG (50000 UT) Caps capsule Commonly known as:  DRISDOL Take 50,000 Units by mouth once a week.       Allergies: No Known Allergies  Past Medical History:  Diagnosis Date  . Anemia   . Anxiety   . Arthritis   . CAD (coronary artery disease) 2016   non-obstructive at cath  . Colon polyps 09/11/2010   Tubular adenoma  . COPD 12/14/2008   PATIENT DENIES    . Depression   . Diabetic coma  ([place]) Feb. 2014  . Fall at home 2016   x 2 in January 2016, Left knee  . Fibromyalgia   . GERD 07/20/2006  . Headache(784.0)   . History of transient ischemic attack (TIA)   . HPV (human papilloma virus) infection   . Hyperlipidemia   . Hypertension   . Hypothyroidism   . Insulin dependent type 2 diabetes mellitus, controlled ([place]) 1989  . Lumbar disc disease   . Neuromuscular disorder ([place])    PERIPHERAL NEUROPATHY  . Peripheral vascular disease ([place])   . Personal history of malignant neoplasm of kidney(V10.52) 12/14/2008   Laparoscopic biopsy and cryoablation 7/08 Dr. Vernie Shanks   . Renal disorder    chronic kidney disease   . Stroke [facility])    3 MINI STROKES  RT SIDED WEAKNESS  . Vertigo     Past Surgical History:  Procedure Laterality Date  . ABDOMINAL AORTAGRAM N/A 08/21/2011   Procedure: ABDOMINAL Maxcine Ham;  Surgeon: Elam Dutch, MD;  Location: [facility] CATH LAB;  Service: Cardiovascular;  Laterality: N/A;  . ABDOMINAL AORTAGRAM N/A 03/16/2014   Procedure: ABDOMINAL Maxcine Ham;  Surgeon: Elam Dutch, MD;  Location: [facility] CATH LAB;  Service: Cardiovascular;  Laterality: N/A;  . ABDOMINAL HYSTERECTOMY    . APPENDECTOMY    . BLADDER SUSPENSION    . CARDIAC CATHETERIZATION    . CHOLECYSTECTOMY OPEN    . ESOPHAGOGASTRODUODENOSCOPY (EGD) WITH PROPOFOL N/A 05/16/2014   Procedure: ESOPHAGOGASTRODUODENOSCOPY (EGD) WITH PROPOFOL with Balloon dilation;  Surgeon: Milus Banister, MD;  Location:  [facility] ENDOSCOPY;  Service: Endoscopy;  Laterality: N/A;  . ESOPHAGOGASTRODUODENOSCOPY (EGD) WITH PROPOFOL N/A 09/02/2015   Procedure: ESOPHAGOGASTRODUODENOSCOPY (EGD) WITH PROPOFOL;  Surgeon: Doran Stabler, MD;  Location: [facility];  Service: Gastroenterology;  Laterality: N/A;  . FEMORAL-POPLITEAL BYPASS GRAFT  10/12/2011   Procedure: BYPASS GRAFT FEMORAL-POPLITEAL ARTERY;  Surgeon: Elam Dutch, MD;  Location: [facility] OR;  Service: Vascular;  Laterality: Left;  Left Femoral-Popliteal Bypass  Graft using 82mm x 80cm Propaten Graft with intraop arteriogram times one.  . FEMORAL-POPLITEAL BYPASS GRAFT Left 04/17/2014   Procedure: REDO LEFT FEMORAL-POPLITEAL ARTERY BYPASS USING GORE PROPATEN 53mmx80cm GRAFT;  Surgeon: Elam Dutch, MD;  Location: [facility] [place];  Service: Vascular;  Laterality: Left;  . FOOT SURGERY Left    "bone spurs"  . INTRAOPERATIVE ARTERIOGRAM  10/12/2011   Procedure: INTRA OPERATIVE ARTERIOGRAM;  Surgeon: Elam Dutch, MD;  Location: [place];  Service: Vascular;  Laterality: Left;  . KNEE ARTHROSCOPY Bilateral    "cartilage"  . Laparascopic cryoablation of left kidney  08/2006   Dr. Gladis Riffle for renal cell cancer  . LEFT HEART CATHETERIZATION WITH CORONARY ANGIOGRAM N/A 05/11/2011   Procedure: LEFT HEART CATHETERIZATION WITH CORONARY ANGIOGRAM;  Surgeon: Jolaine Artist, MD;  Location: [facility] CATH LAB;  Service: Cardiovascular;  Laterality: N/A;  . LUNG SURGERY Right    lung nodule removed from the right side  . PR VEIN BYPASS GRAFT,AORTO-FEM-POP  05/27/2010  . SHOULDER ARTHROSCOPY Bilateral    'Spurs"  . TUBAL LIGATION      Family History  Problem Relation Age of Onset  . Heart disease Mother   . Lung cancer Mother   . Cancer Mother        Lung  . Heart disease Father   . Lung cancer Father   . Cancer Father        Lung  . Heart disease Sister        CABG- Open Heart    Social History:  reports that she has been smoking cigarettes. She has a 75.00 pack-year smoking history. She has never used smokeless tobacco. She reports that she does not drink alcohol or use drugs.    Review of Systems     DEPRESSION: She is treated with Cymbalta along with nortriptyline  Also takes this for neuropathy  Lipid history:  She has good control with taking pravastatin 20 mg but has high triglycerides    Lab Results  Component Value Date   CHOL 158 09/17/2017   HDL 34.60 (L) 09/17/2017   LDLCALC Comment 04/20/2017   LDLDIRECT 87.0 09/17/2017   TRIG 328.0 (H)  09/17/2017   CHOLHDL 5 09/17/2017            Neurological:    Has no numbness, burning but does have pains , burning and some tingling in feet.   Symptoms had improved with  Cymbalta  60 mg   She is being treated with nortriptyline by PCP   She has had  peripheral vascular disease and treated with Pletal from vascular surgeon  Long history of thyroid disease, Has had post ablative hypothyroidism, on 175ug and followed by PCP Although TSH is low she does not complain of shakiness or palpitations   Lab Results  Component Value Date   TSH 0.02 (L) 01/12/2018    Last  Foot exam findings: Monofilament sensation is markedly reduced across most of the toes and distal plantar surfaces  DIZZINESS: She says that periodically she will feel very lightheaded on  standing up and will have to sit down Currently not on any diuretics or antihypertensives    Physical Examination:  BP 122/76   Pulse 88   Ht 5\' 1"  (1.549 m)   Wt 156 lb (70.8 kg)   SpO2 97%   BMI 29.48 kg/m   STANDING blood pressure: 90/50 No ankle edema  ASSESSMENT:   Diabetes type 2, uncontrolled on Omnipod insulin pump  See history of present illness for detailed discussion of current management, blood sugar patterns and problems identified  Her A1c is improving with using the Omnipod pump and now 7.8 and previously frequently over 9%  Recently her fasting readings are excellent occasional low sugars also However she is not checking her blood sugars during the day as much Also forgetting to bolus in the mornings for breakfast but also is not understanding the need to bolus despite normal pre-meal sugars Since she is eating out a lot in the evening she is not taking her pump to bolus before the meal and this is causing mostly high readings in the evenings, mostly over 200 and as high as 400+  ORTHOSTATIC hypotension: Likely she has autonomic neuropathy with orthostatic hypotension without any treatment  currently  HYPOTHYROIDISM: She will need to reduce her Synthroid to 150 at least because of low TSH  PLAN:    She will reduce her basal rate down to 1.35 from midnight until 8 AM and this was done in the office She was reminded to bolus for every meal including breakfast despite normal sugars She will take her controller with her to bolus at suppertime and have her friend remind her to take this also Also discussed adjusting the dose up or down 2 units at least if eating relatively higher amounts of carbohydrates or high fat meals She will try to count carbohydrates as much as possible also To check blood sugars midday even if not planning to eat a meal  May need to have her review her management with diabetes educator also  Reminded her to have more regular follow-up for improving her management  Trial of FLORINEF 0.1 mg 3 times a week for orthostasis and follow-up in 1 month She will continue potassium supplements for now also  Reduce Synthroid to 150  Patient Instructions  Put in Carbs according to what u eat  Must take pump at dinner time  New rx for thyoid  Florinef 3/7 days  Counseling time on subjects discussed in assessment and plan sections is over 50% of today's 25 minute visit    Elayne Snare 01/16/2018, 4:01 PM   Note: This office note was prepared with Dragon voice recognition system technology. Any transcriptional errors that result from this process are unintentional.

## 2018-01-14 NOTE — Patient Instructions (Addendum)
Put in Carbs according to what u eat  Must take pump at dinner time  New rx for thyoid  Florinef 3/7 days

## 2018-01-16 MED ORDER — LEVOTHYROXINE SODIUM 150 MCG PO TABS: 150.0000 ug | ORAL_TABLET | Freq: Every day | ORAL | 3 refills | Status: DC

## 2018-01-18 ENCOUNTER — Other Ambulatory Visit: Payer: Self-pay | Admitting: Nephrology

## 2018-01-18 DIAGNOSIS — N183 Chronic kidney disease, stage 3 unspecified: Secondary | ICD-10-CM

## 2018-01-24 ENCOUNTER — Ambulatory Visit
Admission: RE | Admit: 2018-01-24 | Discharge: 2018-01-24 | Disposition: A | Discharge status: Home / Self Care | Payer: Medicare Other | Source: Ambulatory Visit | Attending: Nephrology | Admitting: Nephrology

## 2018-01-24 DIAGNOSIS — N183 Chronic kidney disease, stage 3 unspecified: Secondary | ICD-10-CM

## 2018-01-28 ENCOUNTER — Telehealth: Payer: Self-pay

## 2018-01-28 ENCOUNTER — Other Ambulatory Visit: Payer: Self-pay

## 2018-01-28 MED ORDER — DULOXETINE HCL 60 MG PO CPEP: 60.0000 mg | ORAL_CAPSULE | Freq: Every day | ORAL | 3 refills | Status: DC

## 2018-01-28 NOTE — Telephone Encounter (Signed)
Okay to prescribe?

## 2018-01-28 NOTE — Telephone Encounter (Signed)
Pt requesting refill on Duloxetine HCL Dr 60mg  capsules. Take 1 tab by mouth daily.  This is not on her med list. Is this okay with you?

## 2018-01-31 ENCOUNTER — Other Ambulatory Visit: Payer: Self-pay | Admitting: Physician Assistant

## 2018-01-31 DIAGNOSIS — F17200 Nicotine dependence, unspecified, uncomplicated: Secondary | ICD-10-CM

## 2018-02-18 ENCOUNTER — Telehealth: Payer: Self-pay

## 2018-02-18 ENCOUNTER — Other Ambulatory Visit (INDEPENDENT_AMBULATORY_CARE_PROVIDER_SITE_OTHER): Payer: Medicare Other

## 2018-02-18 DIAGNOSIS — E038 Other specified hypothyroidism: Secondary | ICD-10-CM | POA: Diagnosis not present

## 2018-02-18 DIAGNOSIS — I951 Orthostatic hypotension: Secondary | ICD-10-CM

## 2018-02-18 LAB — BASIC METABOLIC PANEL
BUN: 16 mg/dL (ref 6–23)
CO2: 27 mEq/L (ref 19–32)
Calcium: 9.2 mg/dL (ref 8.4–10.5)
Chloride: 103 mEq/L (ref 96–112)
Creatinine, Ser: 1.25 mg/dL — ABNORMAL HIGH (ref 0.40–1.20)
GFR: 44.96 mL/min — AB (ref >60.00)
Glucose, Bld: 37 mg/dL — CL (ref 70–99)
Potassium: 3.2 mEq/L — ABNORMAL LOW (ref 3.5–5.1)
Sodium: 140 mEq/L (ref 135–145)

## 2018-02-18 LAB — TSH: TSH: 1.03 u[IU]/mL (ref 0.35–4.50)

## 2018-02-18 LAB — T4, FREE: FREE T4: 1.12 ng/dL (ref 0.60–1.60)

## 2018-02-18 NOTE — Telephone Encounter (Signed)
Called Dr. [name] left message main lab call with a critical glucose of 37. Olen Cordial has already called the patient

## 2018-02-18 NOTE — Telephone Encounter (Signed)
error 

## 2018-02-18 NOTE — Telephone Encounter (Signed)
Need to know if she took her bolus insulin and did not eat.  If she is getting low blood sugars without boluses then she needs to come in on Monday to see me or Vaughan Basta

## 2018-02-18 NOTE — Telephone Encounter (Signed)
Pt called back and stated that she was okay now, but after leaving the office she was symptomatic, but did not associate the symptoms to hypoglycemia. The pt stated that she was very weak, with blurred vision and lightheadedness. Pt stopped to get something to drink and her blood sugar leveled back out. Pt is now okay.

## 2018-02-18 NOTE — Telephone Encounter (Signed)
Pt stated that she did not take any insulin before her lab work.

## 2018-02-23 NOTE — Progress Notes (Signed)
Patient ID: April Branch, female   DOB: 09-08-1945, 73 y.o.   MRN: [id number]           Reason for Appointment: Follow-up  for Type 2 Diabetes  Referring physician: Pattricia Boss  History of Present Illness:          Date of diagnosis of type 2 diabetes mellitus: 1980s       Previous history:    She thinks she was treated with metformin for some time before she went on insulin in ?  2002   She has mostly been treated with Lantus and rapid acting insulin such as Apidra or Novolog.   She thinks that Apidra worked fairly fast but insurance did not cover this, generally has had poor control   On her initial consultation she was switched from Lantus to [employer] because of blood sugars mostly over 300  Was also started on metformin ER 750 mg, 2 tablets daily initially and  in 3/17 she was started on Invokana 100 mg daily because of  poor control  Recent history:   INSULIN regimen: OMNIPOD pump using Humalog U-100 insulin  Current settings basal rate: Midnight = 1.35, 8 a.m. = 1.6 units/h, boluses, presets for units breakfast, 4 units lunch and 4-6 at dinnertime Recent basal daily dose 37.2 units  Her A1c had improved in November down to 7.8  Current blood sugar patterns and problems identified:  Her overnight basal rate was reduced on her last visit from 1.45 down to 1.35  With this her blood sugars are not low on waking up  She had a low blood sugar in the office because she bolused before coming here and did not eat at all  She is trying to check her sugars more consistently in the last week she is now using her Prodigy meter and she thinks this is quicker  Although she can enter her readings in the pump she does not know how to link her contour meter to her pump  Also she thinks that the battery in her controller goes down overnight and she will need to turn it off otherwise it will not,  However she appears to be getting her basal delivery most of the time without any  problems  Blood sugars are HIGHER at lunch and in the evenings although not very consistent  Does not check readings after meals  She is normally watching her portions except over the holidays  Has gained about 3 pounds No hypoglycemia recently  Previously was on Invokana but this has not been continued because of renal dysfunction times and low blood pressure  Oral hypoglycemic drugs the patient is taking are: None  Side effects from medications have been: none  Compliance with the medical regimen: Improving   Glucose monitoring:  done 2-3 times a day         Glucometer:  Prodigy currently The bottom    Blood Glucose readings by monitor download:   PRE-MEAL Fasting Lunch Dinner Bedtime Overall  Glucose range:  90-329   146-263    Mean/median:  142  189  210   200   POST-MEAL PC Breakfast PC Lunch  8 PM +  Glucose range:    158-332  Mean/median:    233   PREVIOUS:  PRE-MEAL Fasting Lunch  afternoon Bedtime Overall  Glucose range:  54-228  165  146-354    Mean/median:        POST-MEAL PC Breakfast PC Lunch PC Dinner  Glucose range:  141-410  Mean/median:        Self-care: The diet that the patient has been following is: tries to limit  High-fat foods.      Typical meal intake: Breakfast is variable at 11 AM, frequently no lunch, dinner 7 pm              Dietician visit, most recent: never               Exercise: Unable to do much  Weight history:  Highest 199   Wt Readings from Last 3 Encounters:  02/24/18 159 lb 3.2 oz (72.2 kg)  01/14/18 156 lb (70.8 kg)  01/13/18 157 lb (71.2 kg)    Glycemic control:   Lab Results  Component Value Date   HGBA1C 7.8 (H) 01/12/2018   HGBA1C 9.7 (A) 08/17/2017   HGBA1C 9.2 (H) 10/31/2016   Lab Results  Component Value Date   MICROALBUR 1.8 08/17/2017   LDLCALC Comment 04/20/2017   CREATININE 1.25 (H) 02/18/2018    Lab on 02/18/2018  Component Date Value Ref Range Status  . Free T4 02/18/2018 1.12  0.60 -  1.60 ng/dL Final   Comment: Specimens from patients who are undergoing biotin therapy and /or ingesting biotin supplements may contain high levels of biotin.  The higher biotin concentration in these specimens interferes with this Free T4 assay.  Specimens that contain high levels  of biotin may cause false high results for this Free T4 assay.  Please interpret results in light of the total clinical presentation of the patient.    Marland Kitchen TSH 02/18/2018 1.03  0.35 - 4.50 uIU/mL Final  . Sodium 02/18/2018 140  135 - 145 mEq/L Final  . Potassium 02/18/2018 3.2* 3.5 - 5.1 mEq/L Final  . Chloride 02/18/2018 103  96 - 112 mEq/L Final  . CO2 02/18/2018 27  19 - 32 mEq/L Final  . Glucose, Bld 02/18/2018 37* 70 - 99 mg/dL Final  . BUN 02/18/2018 16  6 - 23 mg/dL Final  . Creatinine, Ser 02/18/2018 1.25* 0.40 - 1.20 mg/dL Final  . Calcium 02/18/2018 9.2  8.4 - 10.5 mg/dL Final  . GFR 02/18/2018 44.96* >60.00 mL/min Final    OTHER active problems: See review of systems    Allergies as of 02/24/2018   No Known Allergies     Medication List       Accurate as of February 24, 2018  4:42 PM. Always use your most recent med list.        ACCU-CHEK FASTCLIX LANCETS Misc Use 4 per day to check blood sugar dx code E11.9   albuterol 108 (90 Base) MCG/ACT inhaler Commonly known as:  PROVENTIL HFA;VENTOLIN HFA Inhale 1-2 puffs into the lungs every 6 (six) hours as needed for wheezing or shortness of breath.   cilostazol 100 MG tablet Commonly known as:  PLETAL TAKE 1 TABLET BY MOUTH TWICE A DAY BEFORE MEALS   clopidogrel 75 MG tablet Commonly known as:  PLAVIX Take 75 mg by mouth daily.   doxycycline 100 MG capsule Commonly known as:  VIBRAMYCIN Take 1 capsule (100 mg total) by mouth 2 (two) times daily.   DULoxetine 60 MG capsule Commonly known as:  CYMBALTA Take 1 capsule (60 mg total) by mouth daily.   ferrous sulfate 325 (65 FE) MG tablet Take 325 mg by mouth daily with breakfast.    fludrocortisone 0.1 MG tablet Commonly known as:  FLORINEF 1 tablet 3 times a week   gabapentin 300  MG capsule Commonly known as:  NEURONTIN Take 1 capsule (300 mg total) by mouth 2 (two) times daily.   glucose blood test strip Commonly known as:  CONTOUR NEXT TEST Use to test blood sugar 3 times daily E11.9   HYDROcodone-acetaminophen 10-325 MG tablet Commonly known as:  NORCO Take 1 tablet by mouth 3 (three) times daily as needed (for pain). Follow up with PCP if refills needed   hydrOXYzine 25 MG tablet Commonly known as:  ATARAX/VISTARIL Take 1 tablet (25 mg total) by mouth every 6 (six) hours.   insulin lispro 100 UNIT/ML injection Commonly known as:  HUMALOG Use 70 units daily in insulin pump   Insulin Pen Needle 32G X 4 MM Misc Commonly known as:  BD PEN NEEDLE NANO U/F Use to inject insulin   lansoprazole 30 MG capsule Commonly known as:  PREVACID Take 1 capsule (30 mg total) by mouth daily.   levothyroxine 150 MCG tablet Commonly known as:  SYNTHROID, LEVOTHROID Take 1 tablet (150 mcg total) by mouth daily.   metoprolol succinate 25 MG 24 hr tablet Commonly known as:  TOPROL-XL TAKE 1 TABLET BY MOUTH EVERY DAY   nortriptyline 75 MG capsule Commonly known as:  PAMELOR Take 75 mg by mouth at bedtime.   potassium chloride 10 MEQ tablet Commonly known as:  KLOR-CON 10 Take 1 tablet (10 mEq total) by mouth 2 (two) times daily.   pravastatin 20 MG tablet Commonly known as:  PRAVACHOL Take 20 mg by mouth daily.   QUEtiapine 100 MG tablet Commonly known as:  SEROQUEL Take 100 mg by mouth at bedtime.   tolterodine 4 MG 24 hr capsule Commonly known as:  DETROL LA Take 4 mg by mouth daily.   triamcinolone cream 0.1 % Commonly known as:  KENALOG Apply 1 application topically 2 (two) times daily.   Vitamin D (Ergocalciferol) 1.25 MG (50000 UT) Caps capsule Commonly known as:  DRISDOL Take 50,000 Units by mouth once a week.       Allergies: No Known  Allergies  Past Medical History:  Diagnosis Date  . Anemia   . Anxiety   . Arthritis   . CAD (coronary artery disease) 2016   non-obstructive at cath  . Colon polyps 09/11/2010   Tubular adenoma  . COPD 12/14/2008   PATIENT DENIES    . Depression   . Diabetic coma ([place]) Feb. 2014  . Fall at home 2016   x 2 in January 2016, Left knee  . Fibromyalgia   . GERD 07/20/2006  . Headache(784.0)   . History of transient ischemic attack (TIA)   . HPV (human papilloma virus) infection   . Hyperlipidemia   . Hypertension   . Hypothyroidism   . Insulin dependent type 2 diabetes mellitus, controlled ([place]) 1989  . Lumbar disc disease   . Neuromuscular disorder ([place])    PERIPHERAL NEUROPATHY  . Peripheral vascular disease ([place])   . Personal history of malignant neoplasm of kidney(V10.52) 12/14/2008   Laparoscopic biopsy and cryoablation 7/08 Dr. Vernie Shanks   . Renal disorder    chronic kidney disease   . Stroke [facility])    3 MINI STROKES  RT SIDED WEAKNESS  . Vertigo     Past Surgical History:  Procedure Laterality Date  . ABDOMINAL AORTAGRAM N/A 08/21/2011   Procedure: ABDOMINAL Maxcine Ham;  Surgeon: Elam Dutch, MD;  Location: [facility] CATH LAB;  Service: Cardiovascular;  Laterality: N/A;  . ABDOMINAL AORTAGRAM N/A 03/16/2014   Procedure: ABDOMINAL AORTAGRAM;  Surgeon: Elam Dutch, MD;  Location: [facility] CATH LAB;  Service: Cardiovascular;  Laterality: N/A;  . ABDOMINAL HYSTERECTOMY    . APPENDECTOMY    . BLADDER SUSPENSION    . CARDIAC CATHETERIZATION    . CHOLECYSTECTOMY OPEN    . ESOPHAGOGASTRODUODENOSCOPY (EGD) WITH PROPOFOL N/A 05/16/2014   Procedure: ESOPHAGOGASTRODUODENOSCOPY (EGD) WITH PROPOFOL with Balloon dilation;  Surgeon: Milus Banister, MD;  Location: [place];  Service: Endoscopy;  Laterality: N/A;  . ESOPHAGOGASTRODUODENOSCOPY (EGD) WITH PROPOFOL N/A 09/02/2015   Procedure: ESOPHAGOGASTRODUODENOSCOPY (EGD) WITH PROPOFOL;  Surgeon: Doran Stabler, MD;  Location: [place];  Service: Gastroenterology;  Laterality: N/A;  . FEMORAL-POPLITEAL BYPASS GRAFT  10/12/2011   Procedure: BYPASS GRAFT FEMORAL-POPLITEAL ARTERY;  Surgeon: Elam Dutch, MD;  Location: [facility] OR;  Service: Vascular;  Laterality: Left;  Left Femoral-Popliteal Bypass Graft using 60mm x 80cm Propaten Graft with intraop arteriogram times one.  . FEMORAL-POPLITEAL BYPASS GRAFT Left 04/17/2014   Procedure: REDO LEFT FEMORAL-POPLITEAL ARTERY BYPASS USING GORE PROPATEN 77mmx80cm GRAFT;  Surgeon: Elam Dutch, MD;  Location: [place];  Service: Vascular;  Laterality: Left;  . FOOT SURGERY Left    "bone spurs"  . INTRAOPERATIVE ARTERIOGRAM  10/12/2011   Procedure: INTRA OPERATIVE ARTERIOGRAM;  Surgeon: Elam Dutch, MD;  Location: [place];  Service: Vascular;  Laterality: Left;  . KNEE ARTHROSCOPY Bilateral    "cartilage"  . Laparascopic cryoablation of left kidney  08/2006   Dr. Gladis Riffle for renal cell cancer  . LEFT HEART CATHETERIZATION WITH CORONARY ANGIOGRAM N/A 05/11/2011   Procedure: LEFT HEART CATHETERIZATION WITH CORONARY ANGIOGRAM;  Surgeon: Jolaine Artist, MD;  Location: [facility] CATH LAB;  Service: Cardiovascular;  Laterality: N/A;  . LUNG SURGERY Right    lung nodule removed from the right side  . PR VEIN BYPASS GRAFT,AORTO-FEM-POP  05/27/2010  . SHOULDER ARTHROSCOPY Bilateral    'Spurs"  . TUBAL LIGATION      Family History  Problem Relation Age of Onset  . Heart disease Mother   . Lung cancer Mother   . Cancer Mother        Lung  . Heart disease Father   . Lung cancer Father   . Cancer Father        Lung  . Heart disease Sister        CABG- Open Heart    Social History:  reports that she has been smoking cigarettes. She has a 75.00 pack-year smoking history. She has never used smokeless tobacco. She reports that she does not drink alcohol or use drugs.    Review of Systems     DEPRESSION: She is treated with Cymbalta along with nortriptyline and nortriptyline was  prescribed by PCP Also takes this for neuropathy  Lipid history:  She has good control with taking pravastatin 20 mg but has high triglycerides    Lab Results  Component Value Date   CHOL 158 09/17/2017   HDL 34.60 (L) 09/17/2017   LDLCALC Comment 04/20/2017   LDLDIRECT 87.0 09/17/2017   TRIG 328.0 (H) 09/17/2017   CHOLHDL 5 09/17/2017            Neurological:    Has no numbness,but does have pains , burning and tingling in feet.   Symptoms had improved with  Cymbalta  60 mg but is complaining about symptoms again She was told by her PCP not to take gabapentin and was only given 100 mg which was not helping  She is being  treated with nortriptyline by PCP   She has had  peripheral vascular disease and treated with Pletal from vascular surgeon  Long history of thyroid disease, Has had post ablative hypothyroidism, on 125 dosage for 3 weeks Although she was supposed to be on 150 mcg nephrologist changed her down to 125 next month but no records are available However she still feels cold and sleepy   Lab Results  Component Value Date   TSH 1.03 02/18/2018    Last  Foot exam findings: Monofilament sensation is reduced across most of the toes and distal plantar surfaces Absent pedal pulses  DIZZINESS: She says that her lightheadedness is better with starting Florinef 3 times a week when her blood pressure was 90 systolic standing up However she still has episodes of dizziness when getting up  Pressure appears to be relatively higher now and also has a low potassium She thinks she is taking a potassium supplement daily but no prescription has been given for a while  Lab Results  Component Value Date   K 3.2 (L) 02/18/2018         Physical Examination:  BP (!) 158/90 (BP Location: Left Arm, Patient Position: Sitting, Cuff Size: Normal)   Ht 5\' 1"  (1.549 m)   Wt 159 lb 3.2 oz (72.2 kg)   BMI 30.08 kg/m   STANDING blood pressure: 140/70 ` No ankle  edema  Diabetic Foot Exam - Simple   Simple Foot Form Diabetic Foot exam was performed with the following findings:  Yes   Visual Inspection No deformities, no ulcerations, no other skin breakdown bilaterally:  Yes Sensation Testing See comments:  Yes Pulse Check See comments:  Yes Comments Moderately decreased monofilament sensation in distal toes.  Absent pedal pulses bilaterally      ASSESSMENT:   Diabetes type 2, on Omnipod insulin pump  See history of present illness for detailed discussion of current management, blood sugar patterns and problems identified  Her A1c is last 7.8 and previously frequently over 9%  Although she is doing the OmniPod pump and is able to use it as directed she is not checking her blood sugars with brand-name meter Also not checking readings after meals As discussed above she blood sugar appears to be mostly high during the day and only normal or close to normal fasting when she wakes up Not clear if some of her high readings are from inadequate boluses for variable diet She is also having some technical issues with her pump  ORTHOSTATIC hypotension: Likely she has autonomic neuropathy with orthostatic hypotension without any treatment currently  HYPOTHYROIDISM: She will need to reduce her Synthroid to 150 at least because of low TSH  PLAN:    She will increase her basal rate between 8 AM and 6 PM up to 2.0 and this was done in the office Make sure she checks readings after meals She will call if she has consistently high reading Also reminded her not to when she comes in for lab work to avoid low blood sugars Will have her review her management with diabetes educator and review her blood sugar patterns and diet in follow-up about 10 days from now  She will call the toll-free number with the [employer] for technical issues Also needs to start using the Contour meter  For her orthostasis and autonomic neuropathy she will reduce her  Florinef down to 1 pill weekly for now Increase potassium to twice a day   THYROID: She will continue current  dose of 125 mcg but will ask her nephrologist to recheck the labs later this month when she is going back  Patient Instructions  Call Omnipod about battery on Dash going out too quickly.  Also need to use the contour next meter to check the sugar and connect to the pump.  Please check with Omni pod about connecting the meter and the controller  Increase potassium to 2 tablets daily  Reduce FLUDROCORTISONE down to 1 pill weekly only  Check some blood sugars at bedtime also  Please have the kidney doctor check your thyroid again later this month and let us have the lab results  May take gabapentin at bedtime and also during the day if needed   Counseling time on subjects discussed in assessment and plan sections is over 50% of today's 25 minute visit     Elayne Snare 02/24/2018, 4:42 PM   Note: This office note was prepared with Dragon voice recognition system technology. Any transcriptional errors that result from this process are unintentional.

## 2018-02-24 ENCOUNTER — Telehealth: Payer: Self-pay

## 2018-02-24 ENCOUNTER — Encounter: Payer: Self-pay | Admitting: Endocrinology

## 2018-02-24 ENCOUNTER — Ambulatory Visit (INDEPENDENT_AMBULATORY_CARE_PROVIDER_SITE_OTHER): Payer: Medicare Other | Admitting: Endocrinology

## 2018-02-24 VITALS — BP 158/90 | Ht 61.0 in | Wt 159.2 lb

## 2018-02-24 DIAGNOSIS — E1165 Type 2 diabetes mellitus with hyperglycemia: Secondary | ICD-10-CM | POA: Diagnosis not present

## 2018-02-24 DIAGNOSIS — Z794 Long term (current) use of insulin: Secondary | ICD-10-CM | POA: Diagnosis not present

## 2018-02-24 DIAGNOSIS — E038 Other specified hypothyroidism: Secondary | ICD-10-CM

## 2018-02-24 DIAGNOSIS — I951 Orthostatic hypotension: Secondary | ICD-10-CM

## 2018-02-24 MED ORDER — POTASSIUM CHLORIDE ER 10 MEQ PO TBCR: 10.0000 meq | EXTENDED_RELEASE_TABLET | Freq: Two times a day (BID) | ORAL | 1 refills | Status: DC

## 2018-02-24 MED ORDER — GABAPENTIN 300 MG PO CAPS: 300.0000 mg | ORAL_CAPSULE | Freq: Two times a day (BID) | ORAL | 3 refills | Status: DC

## 2018-02-24 NOTE — Patient Instructions (Addendum)
Call Omnipod about battery on Dash going out too quickly.  Also need to use the contour next meter to check the sugar and connect to the pump.  Please check with Omni pod about connecting the meter and the controller  Increase potassium to 2 tablets daily  Reduce FLUDROCORTISONE down to 1 pill weekly only  Check some blood sugars at bedtime also  Please have the kidney doctor check your thyroid again later this month and let us have the lab results  May take gabapentin at bedtime and also during the day if needed

## 2018-02-24 NOTE — Telephone Encounter (Signed)
Pharmacy called and stated that combining Detrol and Potassium and stated that combining the two drugs can cause bleeding and stomach ulcers. Do you wish to proceed with this prescription?

## 2018-02-24 NOTE — Telephone Encounter (Signed)
She needs to continue potassium and she already has been taking it

## 2018-02-24 NOTE — Telephone Encounter (Signed)
Called pharmacist and informed of Dr. Ronnie Derby orders. Mitzi Hansen verbalized acceptance and understanding.

## 2018-03-02 ENCOUNTER — Encounter: Payer: Medicare Other | Admitting: Nutrition

## 2018-03-08 ENCOUNTER — Encounter: Payer: Medicare Other | Admitting: Nutrition

## 2018-03-09 ENCOUNTER — Encounter: Payer: Medicare Other | Attending: Nutrition | Admitting: Nutrition

## 2018-03-10 ENCOUNTER — Other Ambulatory Visit: Payer: Self-pay

## 2018-03-10 ENCOUNTER — Telehealth: Payer: Self-pay | Admitting: Endocrinology

## 2018-03-10 ENCOUNTER — Telehealth: Payer: Self-pay | Admitting: Dietician

## 2018-03-10 MED ORDER — LEVOTHYROXINE SODIUM 150 MCG PO TABS: 125.0000 ug | ORAL_TABLET | Freq: Every day | ORAL | 3 refills | Status: DC

## 2018-03-10 MED ORDER — DULOXETINE HCL 60 MG PO CPEP: 60.0000 mg | ORAL_CAPSULE | Freq: Every day | ORAL | 3 refills | Status: DC

## 2018-03-10 NOTE — Telephone Encounter (Signed)
[facility] mail[facility] is calling stating they are needing these RX's sent in for the patient:  levothyroxine (SYNTHROID, LEVOTHROID) 150 MCG tablet ACCU-CHEK FASTCLIX LANCETS MISC fludrocortisone (FLORINEF) 0.1 MG tablet Accu-Check ava plus test strips DULoxetine (CYMBALTA) 60 MG capsule  insulin lispro (HUMALOG) 100 UNIT/ML injection   Fax- [phone] Ph: [phone]

## 2018-03-10 NOTE — Telephone Encounter (Signed)
Returned patient call. Patient states that she wants an appointment with April Branch to help with a new meter.   Appointment scheduled for Wednesday, January 22 at 11:00.  Antonieta Iba, RD, LDN, CDE

## 2018-03-10 NOTE — Telephone Encounter (Signed)
rx sent

## 2018-03-16 ENCOUNTER — Other Ambulatory Visit: Payer: Self-pay

## 2018-03-16 ENCOUNTER — Ambulatory Visit: Payer: Medicare Other | Admitting: Nutrition

## 2018-03-16 ENCOUNTER — Inpatient Hospital Stay (HOSPITAL_COMMUNITY)
Admission: EM | Admit: 2018-03-16 | Discharge: 2018-03-20 | DRG: 069 | Disposition: A | Discharge status: Home Health | Payer: Medicare Other | Attending: Internal Medicine | Admitting: Internal Medicine

## 2018-03-16 ENCOUNTER — Emergency Department (HOSPITAL_COMMUNITY): Payer: Medicare Other

## 2018-03-16 ENCOUNTER — Observation Stay (HOSPITAL_COMMUNITY): Payer: Medicare Other

## 2018-03-16 ENCOUNTER — Encounter (HOSPITAL_COMMUNITY): Payer: Self-pay | Admitting: Internal Medicine

## 2018-03-16 DIAGNOSIS — Z66 Do not resuscitate: Secondary | ICD-10-CM | POA: Diagnosis present

## 2018-03-16 DIAGNOSIS — J9600 Acute respiratory failure, unspecified whether with hypoxia or hypercapnia: Secondary | ICD-10-CM | POA: Diagnosis present

## 2018-03-16 DIAGNOSIS — G8929 Other chronic pain: Secondary | ICD-10-CM | POA: Diagnosis present

## 2018-03-16 DIAGNOSIS — K5900 Constipation, unspecified: Secondary | ICD-10-CM | POA: Diagnosis present

## 2018-03-16 DIAGNOSIS — J810 Acute pulmonary edema: Secondary | ICD-10-CM | POA: Diagnosis not present

## 2018-03-16 DIAGNOSIS — E1122 Type 2 diabetes mellitus with diabetic chronic kidney disease: Secondary | ICD-10-CM | POA: Diagnosis present

## 2018-03-16 DIAGNOSIS — E11649 Type 2 diabetes mellitus with hypoglycemia without coma: Secondary | ICD-10-CM | POA: Diagnosis not present

## 2018-03-16 DIAGNOSIS — G8194 Hemiplegia, unspecified affecting left nondominant side: Secondary | ICD-10-CM | POA: Diagnosis present

## 2018-03-16 DIAGNOSIS — I10 Essential (primary) hypertension: Secondary | ICD-10-CM | POA: Diagnosis present

## 2018-03-16 DIAGNOSIS — D631 Anemia in chronic kidney disease: Secondary | ICD-10-CM | POA: Diagnosis present

## 2018-03-16 DIAGNOSIS — Z85528 Personal history of other malignant neoplasm of kidney: Secondary | ICD-10-CM

## 2018-03-16 DIAGNOSIS — I639 Cerebral infarction, unspecified: Secondary | ICD-10-CM | POA: Diagnosis not present

## 2018-03-16 DIAGNOSIS — R270 Ataxia, unspecified: Secondary | ICD-10-CM | POA: Diagnosis present

## 2018-03-16 DIAGNOSIS — F329 Major depressive disorder, single episode, unspecified: Secondary | ICD-10-CM | POA: Diagnosis present

## 2018-03-16 DIAGNOSIS — J449 Chronic obstructive pulmonary disease, unspecified: Secondary | ICD-10-CM | POA: Diagnosis present

## 2018-03-16 DIAGNOSIS — D649 Anemia, unspecified: Secondary | ICD-10-CM

## 2018-03-16 DIAGNOSIS — Z7189 Other specified counseling: Secondary | ICD-10-CM

## 2018-03-16 DIAGNOSIS — R0602 Shortness of breath: Secondary | ICD-10-CM

## 2018-03-16 DIAGNOSIS — R339 Retention of urine, unspecified: Secondary | ICD-10-CM | POA: Diagnosis not present

## 2018-03-16 DIAGNOSIS — Z86010 Personal history of colonic polyps: Secondary | ICD-10-CM

## 2018-03-16 DIAGNOSIS — N189 Chronic kidney disease, unspecified: Secondary | ICD-10-CM | POA: Diagnosis not present

## 2018-03-16 DIAGNOSIS — I130 Hypertensive heart and chronic kidney disease with heart failure and stage 1 through stage 4 chronic kidney disease, or unspecified chronic kidney disease: Secondary | ICD-10-CM | POA: Diagnosis present

## 2018-03-16 DIAGNOSIS — Z8249 Family history of ischemic heart disease and other diseases of the circulatory system: Secondary | ICD-10-CM

## 2018-03-16 DIAGNOSIS — R3121 Asymptomatic microscopic hematuria: Secondary | ICD-10-CM | POA: Diagnosis present

## 2018-03-16 DIAGNOSIS — I2510 Atherosclerotic heart disease of native coronary artery without angina pectoris: Secondary | ICD-10-CM | POA: Diagnosis present

## 2018-03-16 DIAGNOSIS — E039 Hypothyroidism, unspecified: Secondary | ICD-10-CM | POA: Diagnosis present

## 2018-03-16 DIAGNOSIS — N289 Disorder of kidney and ureter, unspecified: Secondary | ICD-10-CM

## 2018-03-16 DIAGNOSIS — Z8673 Personal history of transient ischemic attack (TIA), and cerebral infarction without residual deficits: Secondary | ICD-10-CM

## 2018-03-16 DIAGNOSIS — J9601 Acute respiratory failure with hypoxia: Secondary | ICD-10-CM | POA: Diagnosis present

## 2018-03-16 DIAGNOSIS — N179 Acute kidney failure, unspecified: Secondary | ICD-10-CM | POA: Diagnosis present

## 2018-03-16 DIAGNOSIS — Z794 Long term (current) use of insulin: Secondary | ICD-10-CM

## 2018-03-16 DIAGNOSIS — Z79890 Hormone replacement therapy (postmenopausal): Secondary | ICD-10-CM

## 2018-03-16 DIAGNOSIS — E1151 Type 2 diabetes mellitus with diabetic peripheral angiopathy without gangrene: Secondary | ICD-10-CM | POA: Diagnosis present

## 2018-03-16 DIAGNOSIS — J441 Chronic obstructive pulmonary disease with (acute) exacerbation: Secondary | ICD-10-CM | POA: Diagnosis present

## 2018-03-16 DIAGNOSIS — M797 Fibromyalgia: Secondary | ICD-10-CM | POA: Diagnosis present

## 2018-03-16 DIAGNOSIS — G459 Transient cerebral ischemic attack, unspecified: Principal | ICD-10-CM | POA: Diagnosis present

## 2018-03-16 DIAGNOSIS — R531 Weakness: Secondary | ICD-10-CM

## 2018-03-16 DIAGNOSIS — J9691 Respiratory failure, unspecified with hypoxia: Secondary | ICD-10-CM | POA: Diagnosis present

## 2018-03-16 DIAGNOSIS — R000 Tachycardia, unspecified: Secondary | ICD-10-CM | POA: Diagnosis present

## 2018-03-16 DIAGNOSIS — Z9641 Presence of insulin pump (external) (internal): Secondary | ICD-10-CM | POA: Diagnosis present

## 2018-03-16 DIAGNOSIS — E876 Hypokalemia: Secondary | ICD-10-CM | POA: Diagnosis present

## 2018-03-16 DIAGNOSIS — K219 Gastro-esophageal reflux disease without esophagitis: Secondary | ICD-10-CM | POA: Diagnosis present

## 2018-03-16 DIAGNOSIS — J438 Other emphysema: Secondary | ICD-10-CM | POA: Diagnosis not present

## 2018-03-16 DIAGNOSIS — J40 Bronchitis, not specified as acute or chronic: Secondary | ICD-10-CM | POA: Diagnosis present

## 2018-03-16 DIAGNOSIS — F17210 Nicotine dependence, cigarettes, uncomplicated: Secondary | ICD-10-CM | POA: Diagnosis present

## 2018-03-16 DIAGNOSIS — E1142 Type 2 diabetes mellitus with diabetic polyneuropathy: Secondary | ICD-10-CM | POA: Diagnosis present

## 2018-03-16 DIAGNOSIS — F17200 Nicotine dependence, unspecified, uncomplicated: Secondary | ICD-10-CM | POA: Diagnosis present

## 2018-03-16 DIAGNOSIS — M1990 Unspecified osteoarthritis, unspecified site: Secondary | ICD-10-CM | POA: Diagnosis present

## 2018-03-16 DIAGNOSIS — J209 Acute bronchitis, unspecified: Secondary | ICD-10-CM | POA: Diagnosis present

## 2018-03-16 DIAGNOSIS — N183 Chronic kidney disease, stage 3 unspecified: Secondary | ICD-10-CM | POA: Diagnosis present

## 2018-03-16 DIAGNOSIS — Z79899 Other long term (current) drug therapy: Secondary | ICD-10-CM

## 2018-03-16 DIAGNOSIS — R1310 Dysphagia, unspecified: Secondary | ICD-10-CM | POA: Diagnosis present

## 2018-03-16 DIAGNOSIS — E785 Hyperlipidemia, unspecified: Secondary | ICD-10-CM | POA: Diagnosis present

## 2018-03-16 DIAGNOSIS — R0902 Hypoxemia: Secondary | ICD-10-CM

## 2018-03-16 DIAGNOSIS — F419 Anxiety disorder, unspecified: Secondary | ICD-10-CM | POA: Diagnosis present

## 2018-03-16 DIAGNOSIS — E2740 Unspecified adrenocortical insufficiency: Secondary | ICD-10-CM | POA: Diagnosis present

## 2018-03-16 DIAGNOSIS — J440 Chronic obstructive pulmonary disease with acute lower respiratory infection: Secondary | ICD-10-CM | POA: Diagnosis present

## 2018-03-16 DIAGNOSIS — I5031 Acute diastolic (congestive) heart failure: Secondary | ICD-10-CM | POA: Diagnosis present

## 2018-03-16 DIAGNOSIS — E669 Obesity, unspecified: Secondary | ICD-10-CM | POA: Diagnosis present

## 2018-03-16 DIAGNOSIS — Z6831 Body mass index (BMI) 31.0-31.9, adult: Secondary | ICD-10-CM

## 2018-03-16 DIAGNOSIS — Z7952 Long term (current) use of systemic steroids: Secondary | ICD-10-CM

## 2018-03-16 DIAGNOSIS — E119 Type 2 diabetes mellitus without complications: Secondary | ICD-10-CM

## 2018-03-16 DIAGNOSIS — Z90710 Acquired absence of both cervix and uterus: Secondary | ICD-10-CM

## 2018-03-16 DIAGNOSIS — I509 Heart failure, unspecified: Secondary | ICD-10-CM

## 2018-03-16 LAB — CBC
HCT: 32.4 % — ABNORMAL LOW (ref 36.0–46.0)
Hemoglobin: 10.3 g/dL — ABNORMAL LOW (ref 12.0–15.0)
MCH: 26.8 pg (ref 26.0–34.0)
MCHC: 31.8 g/dL (ref 30.0–36.0)
MCV: 84.2 fL (ref 80.0–100.0)
Platelets: 359 10*3/uL (ref 150–400)
RBC: 3.85 MIL/uL — ABNORMAL LOW (ref 3.87–5.11)
RDW: 14.4 % (ref 11.5–15.5)
WBC: 11.8 10*3/uL — ABNORMAL HIGH (ref 4.0–10.5)
nRBC: 0 % (ref 0.0–0.2)

## 2018-03-16 LAB — COMPREHENSIVE METABOLIC PANEL
ALT: 14 U/L (ref 0–44)
AST: 15 U/L (ref 15–41)
Albumin: 3.4 g/dL — ABNORMAL LOW (ref 3.5–5.0)
Alkaline Phosphatase: 125 U/L (ref 38–126)
Anion gap: 13 (ref 5–15)
BUN: 29 mg/dL — ABNORMAL HIGH (ref 8–23)
CO2: 19 mmol/L — AB (ref 22–32)
Calcium: 9 mg/dL (ref 8.9–10.3)
Chloride: 102 mmol/L (ref 98–111)
Creatinine, Ser: 1.97 mg/dL — ABNORMAL HIGH (ref 0.44–1.00)
GFR calc Af Amer: 29 mL/min — ABNORMAL LOW (ref >60)
GFR calc non Af Amer: 25 mL/min — ABNORMAL LOW (ref >60)
Glucose, Bld: 105 mg/dL — ABNORMAL HIGH (ref 70–99)
Potassium: 4.3 mmol/L (ref 3.5–5.1)
SODIUM: 134 mmol/L — AB (ref 135–145)
Total Bilirubin: 0.6 mg/dL (ref 0.3–1.2)
Total Protein: 7.6 g/dL (ref 6.5–8.1)

## 2018-03-16 LAB — POCT I-STAT EG7
Acid-base deficit: 3 mmol/L — ABNORMAL HIGH (ref 0.0–2.0)
Bicarbonate: 22.1 mmol/L (ref 20.0–28.0)
Calcium, Ion: 1.21 mmol/L (ref 1.15–1.40)
HCT: 32 % — ABNORMAL LOW (ref 36.0–46.0)
Hemoglobin: 10.9 g/dL — ABNORMAL LOW (ref 12.0–15.0)
O2 Saturation: 79 %
Potassium: 4.3 mmol/L (ref 3.5–5.1)
Sodium: 136 mmol/L (ref 135–145)
TCO2: 23 mmol/L (ref 22–32)
pCO2, Ven: 39.5 mmHg — ABNORMAL LOW (ref 44.0–60.0)
pH, Ven: 7.357 (ref 7.250–7.430)
pO2, Ven: 46 mmHg — ABNORMAL HIGH (ref 32.0–45.0)

## 2018-03-16 LAB — GLUCOSE, CAPILLARY
Glucose-Capillary: 244 mg/dL — ABNORMAL HIGH (ref 70–99)
Glucose-Capillary: 255 mg/dL — ABNORMAL HIGH (ref 70–99)

## 2018-03-16 LAB — RESPIRATORY PANEL BY PCR
Adenovirus: NOT DETECTED
Bordetella pertussis: NOT DETECTED
Chlamydophila pneumoniae: NOT DETECTED
Coronavirus 229E: NOT DETECTED
Coronavirus HKU1: NOT DETECTED
Coronavirus NL63: NOT DETECTED
Coronavirus OC43: NOT DETECTED
Influenza A: NOT DETECTED
Influenza B: NOT DETECTED
Metapneumovirus: NOT DETECTED
Mycoplasma pneumoniae: NOT DETECTED
PARAINFLUENZA VIRUS 2-RVPPCR: NOT DETECTED
PARAINFLUENZA VIRUS 3-RVPPCR: NOT DETECTED
Parainfluenza Virus 1: NOT DETECTED
Parainfluenza Virus 4: NOT DETECTED
RHINOVIRUS / ENTEROVIRUS - RVPPCR: NOT DETECTED
Respiratory Syncytial Virus: NOT DETECTED

## 2018-03-16 LAB — DIFFERENTIAL
Abs Immature Granulocytes: 0.06 10*3/uL (ref 0.00–0.07)
Basophils Absolute: 0.1 10*3/uL (ref 0.0–0.1)
Basophils Relative: 1 %
Eosinophils Absolute: 0.3 10*3/uL (ref 0.0–0.5)
Eosinophils Relative: 2 %
Immature Granulocytes: 1 %
Lymphocytes Relative: 17 %
Lymphs Abs: 2 10*3/uL (ref 0.7–4.0)
Monocytes Absolute: 0.6 10*3/uL (ref 0.1–1.0)
Monocytes Relative: 5 %
Neutro Abs: 8.8 10*3/uL — ABNORMAL HIGH (ref 1.7–7.7)
Neutrophils Relative %: 74 %

## 2018-03-16 LAB — PHOSPHORUS: Phosphorus: 5.6 mg/dL — ABNORMAL HIGH (ref 2.5–4.6)

## 2018-03-16 LAB — RAPID URINE DRUG SCREEN, HOSP PERFORMED
AMPHETAMINES: NOT DETECTED
BENZODIAZEPINES: NOT DETECTED
Barbiturates: NOT DETECTED
Cocaine: NOT DETECTED
Opiates: POSITIVE — AB
Tetrahydrocannabinol: NOT DETECTED

## 2018-03-16 LAB — AMMONIA: Ammonia: 13 umol/L (ref 9–35)

## 2018-03-16 LAB — URINALYSIS, ROUTINE W REFLEX MICROSCOPIC
Bilirubin Urine: NEGATIVE
GLUCOSE, UA: NEGATIVE mg/dL
Ketones, ur: NEGATIVE mg/dL
Leukocytes, UA: NEGATIVE
NITRITE: NEGATIVE
Protein, ur: 30 mg/dL — AB
Specific Gravity, Urine: 1.015 (ref 1.005–1.030)
pH: 5 (ref 5.0–8.0)

## 2018-03-16 LAB — POCT I-STAT 7, (LYTES, BLD GAS, ICA,H+H)
Acid-base deficit: 8 mmol/L — ABNORMAL HIGH (ref 0.0–2.0)
Bicarbonate: 17 mmol/L — ABNORMAL LOW (ref 20.0–28.0)
Calcium, Ion: 1.18 mmol/L (ref 1.15–1.40)
HCT: 31 % — ABNORMAL LOW (ref 36.0–46.0)
Hemoglobin: 10.5 g/dL — ABNORMAL LOW (ref 12.0–15.0)
O2 Saturation: 80 %
PO2 ART: 47 mmHg — AB (ref 83.0–108.0)
Potassium: 4.9 mmol/L (ref 3.5–5.1)
Sodium: 135 mmol/L (ref 135–145)
TCO2: 18 mmol/L — AB (ref 22–32)
pCO2 arterial: 31.6 mmHg — ABNORMAL LOW (ref 32.0–48.0)
pH, Arterial: 7.339 — ABNORMAL LOW (ref 7.350–7.450)

## 2018-03-16 LAB — CBG MONITORING, ED: Glucose-Capillary: 85 mg/dL (ref 70–99)

## 2018-03-16 LAB — MAGNESIUM: Magnesium: 1.7 mg/dL (ref 1.7–2.4)

## 2018-03-16 LAB — PROTIME-INR
INR: 1.04
Prothrombin Time: 13.5 seconds (ref 11.4–15.2)

## 2018-03-16 LAB — TSH: TSH: 0.047 u[IU]/mL — ABNORMAL LOW (ref 0.350–4.500)

## 2018-03-16 LAB — I-STAT TROPONIN, ED: Troponin i, poc: 0.01 ng/mL (ref 0.00–0.08)

## 2018-03-16 LAB — MRSA PCR SCREENING: MRSA by PCR: NEGATIVE

## 2018-03-16 LAB — BRAIN NATRIURETIC PEPTIDE: B Natriuretic Peptide: 279.6 pg/mL — ABNORMAL HIGH (ref 0.0–100.0)

## 2018-03-16 LAB — ETHANOL: Alcohol, Ethyl (B): <10 mg/dL (ref <10)

## 2018-03-16 LAB — APTT: aPTT: 30 seconds (ref 24–36)

## 2018-03-16 MED ORDER — SODIUM CHLORIDE 0.9 % IV SOLN
100.0000 mg | Freq: Two times a day (BID) | INTRAVENOUS | Status: DC
  Administered 2018-03-17 – 2018-03-18 (×3): 100 mg via INTRAVENOUS
  Filled 2018-03-16 (×5): qty 100

## 2018-03-16 MED ORDER — DOXYCYCLINE HYCLATE 100 MG PO TABS: 100.0000 mg | ORAL_TABLET | Freq: Two times a day (BID) | ORAL | Status: DC

## 2018-03-16 MED ORDER — ORAL CARE MOUTH RINSE
15.0000 mL | Freq: Two times a day (BID) | OROMUCOSAL | Status: DC
  Administered 2018-03-17 – 2018-03-18 (×3): 15 mL via OROMUCOSAL

## 2018-03-16 MED ORDER — ASPIRIN 300 MG RE SUPP
300.0000 mg | Freq: Every day | RECTAL | Status: DC
  Administered 2018-03-17: 300 mg via RECTAL
  Filled 2018-03-16: qty 1

## 2018-03-16 MED ORDER — LEVOTHYROXINE SODIUM 25 MCG PO TABS: 125.0000 ug | ORAL_TABLET | Freq: Every day | ORAL | Status: DC

## 2018-03-16 MED ORDER — BUDESONIDE 0.5 MG/2ML IN SUSP
0.5000 mg | Freq: Two times a day (BID) | RESPIRATORY_TRACT | Status: DC
  Administered 2018-03-16 – 2018-03-20 (×8): 0.5 mg via RESPIRATORY_TRACT
  Filled 2018-03-16 (×9): qty 2

## 2018-03-16 MED ORDER — PANTOPRAZOLE SODIUM 40 MG IV SOLR: 40.0000 mg | INTRAVENOUS | Status: DC

## 2018-03-16 MED ORDER — HEPARIN SODIUM (PORCINE) 5000 UNIT/ML IJ SOLN
5000.0000 [IU] | Freq: Three times a day (TID) | INTRAMUSCULAR | Status: DC
  Administered 2018-03-16 – 2018-03-20 (×12): 5000 [IU] via SUBCUTANEOUS
  Filled 2018-03-16 (×12): qty 1

## 2018-03-16 MED ORDER — ASPIRIN 325 MG PO TABS: 325.0000 mg | ORAL_TABLET | Freq: Every day | ORAL | Status: DC

## 2018-03-16 MED ORDER — IPRATROPIUM-ALBUTEROL 0.5-2.5 (3) MG/3ML IN SOLN
3.0000 mL | Freq: Once | RESPIRATORY_TRACT | Status: AC
  Administered 2018-03-16: 3 mL via RESPIRATORY_TRACT
  Filled 2018-03-16: qty 3

## 2018-03-16 MED ORDER — PANTOPRAZOLE SODIUM 20 MG PO TBEC: 20.0000 mg | DELAYED_RELEASE_TABLET | Freq: Every day | ORAL | Status: DC

## 2018-03-16 MED ORDER — GABAPENTIN 300 MG PO CAPS: 300.0000 mg | ORAL_CAPSULE | Freq: Two times a day (BID) | ORAL | Status: DC

## 2018-03-16 MED ORDER — PREDNISONE 20 MG PO TABS
40.0000 mg | ORAL_TABLET | Freq: Once | ORAL | Status: DC
  Filled 2018-03-16: qty 2

## 2018-03-16 MED ORDER — INSULIN ASPART 100 UNIT/ML ~~LOC~~ SOLN: 0.0000 [IU] | Freq: Every day | SUBCUTANEOUS | Status: DC

## 2018-03-16 MED ORDER — NICOTINE 14 MG/24HR TD PT24
14.0000 mg | MEDICATED_PATCH | Freq: Every day | TRANSDERMAL | Status: DC
  Administered 2018-03-16 – 2018-03-20 (×5): 14 mg via TRANSDERMAL
  Filled 2018-03-16 (×5): qty 1

## 2018-03-16 MED ORDER — BUDESONIDE 0.5 MG/2ML IN SUSP: 2.0000 mg | Freq: Four times a day (QID) | RESPIRATORY_TRACT | Status: DC

## 2018-03-16 MED ORDER — HYDROCODONE-ACETAMINOPHEN 10-325 MG PO TABS: 1.0000 | ORAL_TABLET | Freq: Three times a day (TID) | ORAL | Status: DC | PRN

## 2018-03-16 MED ORDER — QUETIAPINE FUMARATE 50 MG PO TABS: 100.0000 mg | ORAL_TABLET | Freq: Every day | ORAL | Status: DC

## 2018-03-16 MED ORDER — INSULIN ASPART 100 UNIT/ML ~~LOC~~ SOLN: 0.0000 [IU] | Freq: Three times a day (TID) | SUBCUTANEOUS | Status: DC

## 2018-03-16 MED ORDER — PANTOPRAZOLE SODIUM 40 MG IV SOLR
40.0000 mg | INTRAVENOUS | Status: DC
  Administered 2018-03-16 – 2018-03-17 (×2): 40 mg via INTRAVENOUS
  Filled 2018-03-16 (×3): qty 40

## 2018-03-16 MED ORDER — FERROUS SULFATE 325 (65 FE) MG PO TABS: 325.0000 mg | ORAL_TABLET | Freq: Every day | ORAL | Status: DC

## 2018-03-16 MED ORDER — HYDROMORPHONE HCL 1 MG/ML IJ SOLN
0.5000 mg | Freq: Four times a day (QID) | INTRAMUSCULAR | Status: DC | PRN
  Administered 2018-03-17 (×3): 0.5 mg via INTRAVENOUS
  Filled 2018-03-16 (×3): qty 1

## 2018-03-16 MED ORDER — HYDROXYZINE HCL 25 MG PO TABS: 25.0000 mg | ORAL_TABLET | Freq: Four times a day (QID) | ORAL | Status: DC | PRN

## 2018-03-16 MED ORDER — AMLODIPINE BESYLATE 5 MG PO TABS: 10.0000 mg | ORAL_TABLET | Freq: Every day | ORAL | Status: DC

## 2018-03-16 MED ORDER — CILOSTAZOL 100 MG PO TABS: 100.0000 mg | ORAL_TABLET | Freq: Two times a day (BID) | ORAL | Status: DC

## 2018-03-16 MED ORDER — METHYLPREDNISOLONE SODIUM SUCC 125 MG IJ SOLR
60.0000 mg | Freq: Two times a day (BID) | INTRAMUSCULAR | Status: DC
  Administered 2018-03-17: 60 mg via INTRAVENOUS
  Filled 2018-03-16: qty 2

## 2018-03-16 MED ORDER — FUROSEMIDE 10 MG/ML IJ SOLN
INTRAMUSCULAR | Status: AC
  Administered 2018-03-16: 40 mg via INTRAVENOUS
  Filled 2018-03-16: qty 4

## 2018-03-16 MED ORDER — DULOXETINE HCL 60 MG PO CPEP: 60.0000 mg | ORAL_CAPSULE | Freq: Every day | ORAL | Status: DC

## 2018-03-16 MED ORDER — STROKE: EARLY STAGES OF RECOVERY BOOK
Freq: Once | Status: DC
  Filled 2018-03-16: qty 1

## 2018-03-16 MED ORDER — SODIUM CHLORIDE 0.9 % IV SOLN
100.0000 mg | Freq: Once | INTRAVENOUS | Status: AC
  Administered 2018-03-16: 100 mg via INTRAVENOUS
  Filled 2018-03-16: qty 100

## 2018-03-16 MED ORDER — METOPROLOL SUCCINATE ER 25 MG PO TB24: 25.0000 mg | ORAL_TABLET | Freq: Every day | ORAL | Status: DC

## 2018-03-16 MED ORDER — ARFORMOTEROL TARTRATE 15 MCG/2ML IN NEBU
15.0000 ug | INHALATION_SOLUTION | Freq: Two times a day (BID) | RESPIRATORY_TRACT | Status: DC
  Administered 2018-03-16 – 2018-03-20 (×8): 15 ug via RESPIRATORY_TRACT
  Filled 2018-03-16 (×10): qty 2

## 2018-03-16 MED ORDER — ALBUTEROL SULFATE (2.5 MG/3ML) 0.083% IN NEBU: 2.5000 mg | INHALATION_SOLUTION | RESPIRATORY_TRACT | Status: DC | PRN

## 2018-03-16 MED ORDER — INSULIN ASPART 100 UNIT/ML ~~LOC~~ SOLN
0.0000 [IU] | SUBCUTANEOUS | Status: DC
  Administered 2018-03-16: 5 [IU] via SUBCUTANEOUS
  Administered 2018-03-17 (×2): 3 [IU] via SUBCUTANEOUS
  Administered 2018-03-17: 8 [IU] via SUBCUTANEOUS
  Administered 2018-03-17: 11 [IU] via SUBCUTANEOUS
  Administered 2018-03-17: 3 [IU] via SUBCUTANEOUS
  Administered 2018-03-17: 2 [IU] via SUBCUTANEOUS
  Administered 2018-03-18: 3 [IU] via SUBCUTANEOUS
  Administered 2018-03-18: 11 [IU] via SUBCUTANEOUS

## 2018-03-16 MED ORDER — CHLORHEXIDINE GLUCONATE 0.12 % MT SOLN
15.0000 mL | Freq: Two times a day (BID) | OROMUCOSAL | Status: DC
  Administered 2018-03-16 – 2018-03-19 (×5): 15 mL via OROMUCOSAL
  Filled 2018-03-16 (×5): qty 15

## 2018-03-16 MED ORDER — IPRATROPIUM-ALBUTEROL 0.5-2.5 (3) MG/3ML IN SOLN
3.0000 mL | Freq: Four times a day (QID) | RESPIRATORY_TRACT | Status: DC
  Administered 2018-03-16 – 2018-03-18 (×7): 3 mL via RESPIRATORY_TRACT
  Filled 2018-03-16 (×7): qty 3

## 2018-03-16 MED ORDER — FLUDROCORTISONE ACETATE 0.1 MG PO TABS
0.1000 mg | ORAL_TABLET | ORAL | Status: DC
  Filled 2018-03-16: qty 1

## 2018-03-16 MED ORDER — NORTRIPTYLINE HCL 25 MG PO CAPS: 75.0000 mg | ORAL_CAPSULE | Freq: Every day | ORAL | Status: DC

## 2018-03-16 MED ORDER — INSULIN GLARGINE 100 UNIT/ML ~~LOC~~ SOLN: 30.0000 [IU] | Freq: Every day | SUBCUTANEOUS | Status: DC

## 2018-03-16 MED ORDER — FUROSEMIDE 10 MG/ML IJ SOLN
40.0000 mg | Freq: Once | INTRAMUSCULAR | Status: AC
  Administered 2018-03-16: 40 mg via INTRAVENOUS

## 2018-03-16 MED ORDER — POTASSIUM CHLORIDE CRYS ER 10 MEQ PO TBCR
10.0000 meq | EXTENDED_RELEASE_TABLET | Freq: Two times a day (BID) | ORAL | Status: DC
  Filled 2018-03-16 (×2): qty 1

## 2018-03-16 MED ORDER — INSULIN DETEMIR 100 UNIT/ML ~~LOC~~ SOLN
15.0000 [IU] | Freq: Two times a day (BID) | SUBCUTANEOUS | Status: DC
  Administered 2018-03-16 – 2018-03-18 (×4): 15 [IU] via SUBCUTANEOUS
  Filled 2018-03-16 (×5): qty 0.15

## 2018-03-16 NOTE — ED Notes (Signed)
Paged Dr. Raelyn Mora

## 2018-03-16 NOTE — ED Notes (Signed)
Patient transported to MRI 

## 2018-03-16 NOTE — ED Notes (Signed)
Pt able to tell me the month, her age, the president, and where we are. Unable to state what year it is. Pt does have some weakness to the left arm and the leg. Pt tired and keeps falling asleep. Pts oxygen drops to the mid to low 80's while sleeping, pt placed on 4 l [place]. Denies hx of COPD, sleep apnea, oxygen use. Oxygen saturation improved to 92% on 4 l [place].

## 2018-03-16 NOTE — ED Notes (Signed)
Pt too lethargic to participate in swallow screen, states that she cannot take medication by mouth "because it hurts my throat".

## 2018-03-16 NOTE — ED Notes (Signed)
Monitor removed from pts arm, put in sharps per pt.

## 2018-03-16 NOTE — Progress Notes (Signed)
Patients O2 saturations down to 83 to 84% on a nasal cannula 5 L/M.  Patient placed on a venturi mask at 40%. SaO2 increased to 90%.  MD notified via text page.

## 2018-03-16 NOTE — Progress Notes (Signed)
Patient would not hold still for exam.  Kept moving and turning.  Only able top obtain limited brain study.  Patient's nurse informed of this and patient sent back to ED.

## 2018-03-16 NOTE — Consult Note (Signed)
Requesting Physician: Dr. Roxanne Mins    Chief Complaint: Left side numbness, weakness  History obtained from: Patient and Chart    HPI:                                                                                                                                       April Branch is a 73 y.o. female with past medical history of hypertension, hyperlipidemia, tobacco abuse, coronary artery disease, diabetes mellitus, hypothyroidism, chronic kidney disease who presented to the emergency department after she developed sudden onset left arm and leg numbness and weakness at 1 PM on 03/15/18. Patient's husband noted patient was off balance and decided to bring her to the hospital.  She underwent head CT which showed no acute findings.  She is not a candidate for TPA she is well outside the window.  Clinically LVO negative.  Allergy was consulted for further evaluation.   Date last known well: 1.21-20 Time last known well: 1 PM tPA Given: No, outside TPA window Baseline MRS:0   Past Medical History:  Diagnosis Date  . Anemia   . Anxiety   . Arthritis   . CAD (coronary artery disease) 2016   non-obstructive at cath  . Colon polyps 09/11/2010   Tubular adenoma  . COPD 12/14/2008   PATIENT DENIES    . Depression   . Diabetic coma ([place]) Feb. 2014  . Fall at home 2016   x 2 in January 2016, Left knee  . Fibromyalgia   . GERD 07/20/2006  . Headache(784.0)   . History of transient ischemic attack (TIA)   . HPV (human papilloma virus) infection   . Hyperlipidemia   . Hypertension   . Hypothyroidism   . Insulin dependent type 2 diabetes mellitus, controlled ([place]) 1989  . Lumbar disc disease   . Neuromuscular disorder ([place])    PERIPHERAL NEUROPATHY  . Peripheral vascular disease ([place])   . Personal history of malignant neoplasm of kidney(V10.52) 12/14/2008   Laparoscopic biopsy and cryoablation 7/08 Dr. Vernie Shanks   . Renal disorder    chronic kidney disease   . Stroke [facility])    3 MINI  STROKES  RT SIDED WEAKNESS  . Vertigo     Past Surgical History:  Procedure Laterality Date  . ABDOMINAL AORTAGRAM N/A 08/21/2011   Procedure: ABDOMINAL Maxcine Ham;  Surgeon: Elam Dutch, MD;  Location: [facility] CATH LAB;  Service: Cardiovascular;  Laterality: N/A;  . ABDOMINAL AORTAGRAM N/A 03/16/2014   Procedure: ABDOMINAL Maxcine Ham;  Surgeon: Elam Dutch, MD;  Location: [facility] CATH LAB;  Service: Cardiovascular;  Laterality: N/A;  . ABDOMINAL HYSTERECTOMY    . APPENDECTOMY    . BLADDER SUSPENSION    . CARDIAC CATHETERIZATION    . CHOLECYSTECTOMY OPEN    . ESOPHAGOGASTRODUODENOSCOPY (EGD) WITH PROPOFOL N/A 05/16/2014   Procedure: ESOPHAGOGASTRODUODENOSCOPY (EGD) WITH PROPOFOL with Balloon dilation;  Surgeon: Milus Banister, MD;  Location: [facility] ENDOSCOPY;  Service: Endoscopy;  Laterality: N/A;  . ESOPHAGOGASTRODUODENOSCOPY (EGD) WITH PROPOFOL N/A 09/02/2015   Procedure: ESOPHAGOGASTRODUODENOSCOPY (EGD) WITH PROPOFOL;  Surgeon: Doran Stabler, MD;  Location: [place];  Service: Gastroenterology;  Laterality: N/A;  . FEMORAL-POPLITEAL BYPASS GRAFT  10/12/2011   Procedure: BYPASS GRAFT FEMORAL-POPLITEAL ARTERY;  Surgeon: Elam Dutch, MD;  Location[facility] OR;  Service: Vascular;  Laterality: Left;  Left Femoral-Popliteal Bypass Graft using 37mm x 80cm Propaten Graft with intraop arteriogram times one.  . FEMORAL-POPLITEAL BYPASS GRAFT Left 04/17/2014   Procedure: REDO LEFT FEMORAL-POPLITEAL ARTERY BYPASS USING GORE PROPATEN 80mmx80cm GRAFT;  Surgeon: Elam Dutch, MD;  Location: [place];  Service: Vascular;  Laterality: Left;  . FOOT SURGERY Left    "bone spurs"  . INTRAOPERATIVE ARTERIOGRAM  10/12/2011   Procedure: INTRA OPERATIVE ARTERIOGRAM;  Surgeon: Elam Dutch, MD;  Location: [place];  Service: Vascular;  Laterality: Left;  . KNEE ARTHROSCOPY Bilateral    "cartilage"  . Laparascopic cryoablation of left kidney  08/2006   Dr. Gladis Riffle for renal cell cancer  . LEFT HEART  CATHETERIZATION WITH CORONARY ANGIOGRAM N/A 05/11/2011   Procedure: LEFT HEART CATHETERIZATION WITH CORONARY ANGIOGRAM;  Surgeon: Jolaine Artist, MD;  Location: [facility] CATH LAB;  Service: Cardiovascular;  Laterality: N/A;  . LUNG SURGERY Right    lung nodule removed from the right side  . PR VEIN BYPASS GRAFT,AORTO-FEM-POP  05/27/2010  . SHOULDER ARTHROSCOPY Bilateral    'Spurs"  . TUBAL LIGATION      Family History  Problem Relation Age of Onset  . Heart disease Mother   . Lung cancer Mother   . Cancer Mother        Lung  . Heart disease Father   . Lung cancer Father   . Cancer Father        Lung  . Heart disease Sister        CABG- Open Heart   Social History:  reports that she has been smoking cigarettes. She has a 75.00 pack-year smoking history. She has never used smokeless tobacco. She reports that she does not drink alcohol or use drugs.  Allergies: No Known Allergies  Medications:                                                                                                                        I reviewed home medications   ROS:  14 systems reviewed and negative except above    Examination:                                                                                                      General: Appears well-developed  Psych: Affect appropriate to situation Eyes: No scleral injection HENT: No OP obstrucion Head: Normocephalic.  Cardiovascular: Normal rate and regular rhythm.  Respiratory: Effort normal and breath sounds normal to anterior ascultation GI: Soft.  No distension. There is no tenderness.  Skin: WDI    Neurological Examination Mental Status: Alert, oriented, thought content appropriate.  Speech fluent without evidence of aphasia. Able to follow 3 step commands without difficulty. Cranial Nerves: II: Visual fields  grossly normal,  III,IV, VI: ptosis not present, extra-ocular motions intact bilaterally, pupils equal, round, reactive to light and accommodation V,VII: smile symmetric, reduced sensation on the left side of face VIII: hearing normal bilaterally IX,X: uvula rises symmetrically XI: bilateral shoulder shrug XII: midline tongue extension Motor: Right : Upper extremity   5/5    Left:     Upper extremity   4+/5  Lower extremity   5/5     Lower extremity   4+/5 Tone and bulk:normal tone throughout; no atrophy noted Sensory: Reduced sensation to light touch and temperature on the left side of face, arm and leg Deep Tendon Reflexes: 3+ and symmetric throughout Plantars: Right: downgoing   Left: downgoing Cerebellar: normal finger-to-nose and normal heel-to-shin test, appears to have asterixis Gait: normal gait and station     Lab Results: Basic Metabolic Panel: Recent Labs  Lab 03/16/18 0617 03/16/18 0753  NA 134* 136  K 4.3 4.3  CL 102  --   CO2 19*  --   GLUCOSE 105*  --   BUN 29*  --   CREATININE 1.97*  --   CALCIUM 9.0  --     CBC: Recent Labs  Lab 03/16/18 0617 03/16/18 0753  WBC 11.8*  --   NEUTROABS 8.8*  --   HGB 10.3* 10.9*  HCT 32.4* 32.0*  MCV 84.2  --   PLT 359  --     Coagulation Studies: Recent Labs    03/16/18 0617  LABPROT 13.5  INR 1.04    Imaging: Ct Head Wo Contrast  Result Date: 03/16/2018 CLINICAL DATA:  Possible stroke. Ataxia with left-sided arm and leg weakness. EXAM: CT HEAD WITHOUT CONTRAST TECHNIQUE: Contiguous axial images were obtained from the base of the skull through the vertex without intravenous contrast. COMPARISON:  04/22/2017 FINDINGS: Brain: No evidence of acute infarction, hemorrhage, hydrocephalus, extra-axial collection or mass lesion/mass effect. Mild chronic small vessel ischemic type change in the cerebral white matter. Vascular: Atherosclerotic calcification.  No hyperdense vessel Skull: No acute finding  Sinuses/Orbits: Endoscopic sinus surgery on the right. There is volume loss at the right ethmoids with relatively expanded right orbit. Bilateral cataract resection. IMPRESSION: No acute finding. Electronically Signed   By: Monte Fantasia M.D.   On: 03/16/2018 07:16   Dg Chest Portable 1 View  Result Date: 03/16/2018 CLINICAL DATA:  Shortness  of breath and hypoxia EXAM: PORTABLE CHEST 1 VIEW COMPARISON:  12/10/2017 FINDINGS: Cardiopericardial enlargement, increased from prior. Diffuse interstitial coarsening that could be congestive or bronchitic. There is a linear density in the right suprahilar lung that is scarring when compared to priors. No effusion or pneumothorax. IMPRESSION: Increased cardiopericardial size with generalized congestive or bronchitic interstitial coarsening. Electronically Signed   By: Monte Fantasia M.D.   On: 03/16/2018 08:14     ASSESSMENT AND PLAN  73 year old female with multiple vascular risk factors presents to the emergency department with 1 day history of sudden onset left side hemiparesis (mild), numbness and mild ataxia.  Acute Ischemic Stroke   Recommend # MRI of the brain without contrast #MRA Head and neck  #Transthoracic Echo  # Start patient on ASA 325mg  daily and Plavix 75mg  daily  #Start or continue Atorvastatin 80 mg/other high intensity statin # BP goal: permissive HTN upto 180/110 mmHg # HBAIC and Lipid profile # Telemetry monitoring # Frequent neuro checks # NPO until passes stroke swallow screen  Please page stroke NP  Or  PA  Or MD from 8am -4 pm  as this patient from this time will be  followed by the stroke.   You can look them up on [email or web address]  Password [facility]     [facility] Pager Number [phone]

## 2018-03-16 NOTE — Progress Notes (Signed)
Patient transported to 5O36 w/o complications.

## 2018-03-16 NOTE — Consult Note (Signed)
NAME:  April Branch, MRN:  [id number], DOB:  Mar 09, 1945, LOS: 0 ADMISSION DATE:  03/16/2018, CONSULTATION DATE:  03/16/18 REFERRING MD:  Sloan Leiter -THN, CHIEF COMPLAINT:  Shortness of breath  Brief History   73 yo F presenting initially as code stroke with L sided numbness who has exhibited progressive shortness of breath and has had increasing oxygen requirements.  History of present illness   73 yo F PMH significant for COPD, CAD, DM2, HLD, TIA, HTN who presented 1/22 with stroke-like symptoms. The patient endorsed L sided numbness and mild left sided weakness. Initial  CT for code stroke reveals no acute infarction or hemorrhage. In the ED following CT, patient exhibited increasing shortness of breath. Patient endorses recent cough. Denies runny rose, fever, chills, vomiting. VBG and ABG reveal hypoxia.   PCCM consulted for dyspnea and hypoxia.   Past Medical History  COPD Tobacco use disorder CAD PVD HTN TIA HLD DM2 Depression  Hypothyroidism CKD GERD Fibromyalgia Anxiety Arthritis HA Malignant neoplasm of kidney Vertigo   Significant Hospital Events   1/22> CT head negative for acute abnormality 1/22> admitted for dyspnea with hypoxia  Consults:  PCCM Neurology Procedures:   Significant Diagnostic Tests:  1/22 Ct Head> negative for acute ischemic or hemorrhagic stroke  1/22 CXR> cardiomegaly, athersclerotic changes of aorta, pulmonary edema pattern   Micro Data:  UA>> RVP>>  Sputum cx>>   Antimicrobials:  Doxycycline 1/22>>   Interim history/subjective:  Patient presented with stroke like symptoms. CT head negative, neurology following. Progressive dyspnea and increasing oxygen requirements. Starting on BiPAP in ED.   Objective   Blood pressure 129/80, pulse (!) 103, temperature 98.1 F (36.7 C), temperature source Oral, resp. rate (!) 22, height 5\' 1"  (1.549 m), weight 71.7 kg, SpO2 91 %.    FiO2 (%):  [55 %] 55 %   Intake/Output Summary (Last 24  hours) at 03/16/2018 1820 Last data filed at 03/16/2018 1544 Gross per 24 hour  Intake -  Output 250 ml  Net -250 ml   Filed Weights   03/16/18 0615  Weight: 71.7 kg    Examination: General: obese older adult female, in moderate respiratory distress  HENT: [place]/AT, moist mucus membranes, trachea midline Lungs: Course breath sounds, no wheezing. Some accessory muscle recruitment  Cardiovascular: sinus tachycardia. 2+ radial pulses, 1+ pedal pulses. Capillary refill < 3 seconds BUE BLE Abdomen: soft round non-tender non-distended.  Extremities: No obvious joint deformity. Symmetrical bulk and tone. No lower extremity pitting edema  Neuro: Awake, alert, oriented to person, place, situation. Follows commands. Answering questions.  Skin: clean, dry, warm, intact.   Resolved Hospital Problem list     Assessment & Plan:    Acute hypoxic respiratory failure Tobacco abuse - unclear etiology, presented with neuro symptoms, vague symptoms of cough and wheezing, and had progressive hypoxia with repeat CXR showing new pulmonary edema.  Failed bedside swallow eval x 2 in ER.  - unclear   P:  Admit to ICU Will place on BiPAP for hypoxia/ increased WOB.   Patient at this time is refusing intubation and is coherent, able to answer all questions appropriately, and states her family is aware of her wishes (Dr. Loanne Drilling, Eliseo Gum, NP, and myself present to witness patient wishes.  Attempting to reach family, a niece, Charise Carwin [phone], who is listed in [facility], called, no answer.  Patient apparently has 3 sons.  Denyse Amass, showed up in ER to verify mothers wishes of DNI/ DNR. Wean supplemental O2 for sats  88-94% Brovonna/ pulmicort BID Solumedrol 60mg  q 12 duonebs q 6, albuterol prn  S/p lasix 40 mg IV Send RVP Continue empiric tx with doxycycline for now, follow sputum cx data to guide abx therapy CXR in am  Smoking cessation counseling   Acute pulmonary edema HTN P:  Tele  monitoring TTE ordered- need to change to bubble study- given apparent platypnea  S/p lasix, strict I/O, daily wts Renal panel in am  Hold PO meds- norvasc, cilostazol,  prn apresoline   Acute on chronic left sided weakness, hx prior stroke P:  Neurology following- ongoing stroke workup MRA ordered (if patient will tolerate)  Ongoing neuro exams HgA1c/ lipid panel pending ASA rectal   DM on insulin pump - basal dose 70 units via insulin pump P:  Insulin pump off Pending A1c Start half dose- levemir 15 BID SSI mod  AKI on CKD stage III P:  Insert foley Strict I/O, UOP, daily weights  Renal panel, mag in am  Renal dose meds   R/o dysphagia -failed bedside swallow x 2 in ER P:  Pending formal SLP NPO  Hypothyroidism ? Adrenal insufficiency  -TSH 0.47 P:  Change synthroid to IV  Hold florinef, may need to start solu cortef if becomes hemodynamically instable  Chronic normocytic anemia P:  hgb trend stable Trend CBC Hold iron  Chronic pain/ anxiety P:  Minimize sedating meds as able  Dilaudid 0.5 mg prn 4 hr Holding cymbalta  Neurontin, hydroxyzine, nortriptyline UDS ordered   Best practice:  Diet: NPO, will need SLP  Pain/Anxiety/Delirium protocol (if indicated): Dilaudid PRN  VAP protocol (if indicated): n/a.  DVT prophylaxis: SCD GI prophylaxis: protonix Glucose control: levemir BID, SSI  Mobility: bedrest  Code Status: DNR/I Family Communication: Son, Denyse Amass, at bedside. Disposition: Admit to ICU   Labs   CBC: Recent Labs  Lab 03/16/18 0617 03/16/18 0753 03/16/18 1801  WBC 11.8*  --   --   NEUTROABS 8.8*  --   --   HGB 10.3* 10.9* 10.5*  HCT 32.4* 32.0* 31.0*  MCV 84.2  --   --   PLT 359  --   --     Basic Metabolic Panel: Recent Labs  Lab 03/16/18 0617 03/16/18 0753 03/16/18 1801  NA 134* 136 135  K 4.3 4.3 4.9  CL 102  --   --   CO2 19*  --   --   GLUCOSE 105*  --   --   BUN 29*  --   --   CREATININE 1.97*  --   --    CALCIUM 9.0  --   --    GFR: Estimated Creatinine Clearance: 24.1 mL/min (A) (by C-G formula based on SCr of 1.97 mg/dL (H)). Recent Labs  Lab 03/16/18 0617  WBC 11.8*    Liver Function Tests: Recent Labs  Lab 03/16/18 0617  AST 15  ALT 14  ALKPHOS 125  BILITOT 0.6  PROT 7.6  ALBUMIN 3.4*   No results for input(s): LIPASE, AMYLASE in the last 168 hours. Recent Labs  Lab 03/16/18 0737  AMMONIA 13    ABG    Component Value Date/Time   PHART 7.339 (L) 03/16/2018 1801   PCO2ART 31.6 (L) 03/16/2018 1801   PO2ART 47.0 (L) 03/16/2018 1801   HCO3 17.0 (L) 03/16/2018 1801   TCO2 18 (L) 03/16/2018 1801   ACIDBASEDEF 8.0 (H) 03/16/2018 1801   O2SAT 80.0 03/16/2018 1801     Coagulation Profile: Recent Labs  Lab 03/16/18  0617  INR 1.04    Cardiac Enzymes: No results for input(s): CKTOTAL, CKMB, CKMBINDEX, TROPONINI in the last 168 hours.  HbA1C: Hemoglobin A1C  Date/Time Value Ref Range Status  08/17/2017 01:45 PM 9.7 (A) 4.0 - 5.6 % Final   Hgb A1c MFr Bld  Date/Time Value Ref Range Status  01/12/2018 11:00 AM 7.8 (H) 4.6 - 6.5 % Final    Comment:    Glycemic Control Guidelines for People with Diabetes:Non Diabetic:  <6%Goal of Therapy: <7%Additional Action Suggested:  >8%   10/31/2016 05:23 AM 9.2 (H) 4.8 - 5.6 % Final    Comment:    (NOTE) Pre diabetes:          5.7%-6.4% Diabetes:              >6.4% Glycemic control for   <7.0% adults with diabetes     CBG: Recent Labs  Lab 03/16/18 0743  GLUCAP 85    Review of Systems:   Endorses shortness of breath, recent cough Denies post-nasal drip Denies fever, chills Denies chest pain Denies nausea, vomiting, diarrhea   Past Medical History  She,  has a past medical history of Anemia, Anxiety, Arthritis, CAD (coronary artery disease) (2016), Colon polyps (09/11/2010), COPD (12/14/2008), Depression, Diabetic coma ([place]) (Feb. 2014), Fall at home (2016), Fibromyalgia, GERD (07/20/2006), Headache(784.0),  History of transient ischemic attack (TIA), HPV (human papilloma virus) infection, Hyperlipidemia, Hypertension, Hypothyroidism, Insulin dependent type 2 diabetes mellitus, controlled ([place]) (1989), Lumbar disc disease, Neuromuscular disorder ([place]), Peripheral vascular disease ([place]), Personal history of malignant neoplasm of kidney(V10.52) (12/14/2008), Renal disorder, Stroke ([place]), and Vertigo.   Surgical History    Past Surgical History:  Procedure Laterality Date  . ABDOMINAL AORTAGRAM N/A 08/21/2011   Procedure: ABDOMINAL Maxcine Ham;  Surgeon: Elam Dutch, MD;  [facility] CATH LAB;  Service: Cardiovascular;  Laterality: N/A;  . ABDOMINAL AORTAGRAM N/A 03/16/2014   Procedure: ABDOMINAL Maxcine Ham;  Surgeon: Elam Dutch, MD;  Location: [facility] CATH LAB;  Service: Cardiovascular;  Laterality: N/A;  . ABDOMINAL HYSTERECTOMY    . APPENDECTOMY    . BLADDER SUSPENSION    . CARDIAC CATHETERIZATION    . CHOLECYSTECTOMY OPEN    . ESOPHAGOGASTRODUODENOSCOPY (EGD) WITH PROPOFOL N/A 05/16/2014   Procedure: ESOPHAGOGASTRODUODENOSCOPY (EGD) WITH PROPOFOL with Balloon dilation;  Surgeon: Milus Banister, MD;  Location: [place];  Service: Endoscopy;  Laterality: N/A;  . ESOPHAGOGASTRODUODENOSCOPY (EGD) WITH PROPOFOL N/A 09/02/2015   Procedure: ESOPHAGOGASTRODUODENOSCOPY (EGD) WITH PROPOFOL;  Surgeon: Doran Stabler, MD;  Location: [place];  Service: Gastroenterology;  Laterality: N/A;  . FEMORAL-POPLITEAL BYPASS GRAFT  10/12/2011   Procedure: BYPASS GRAFT FEMORAL-POPLITEAL ARTERY;  Surgeon: Elam Dutch, MD;  Location: [facility] OR;  Service: Vascular;  Laterality: Left;  Left Femoral-Popliteal Bypass Graft using 33mm x 80cm Propaten Graft with intraop arteriogram times one.  . FEMORAL-POPLITEAL BYPASS GRAFT Left 04/17/2014   Procedure: REDO LEFT FEMORAL-POPLITEAL ARTERY BYPASS USING GORE PROPATEN 63mmx80cm GRAFT;  Surgeon: Elam Dutch, MD;  Location: [place];  Service: Vascular;  Laterality: Left;   . FOOT SURGERY Left    "bone spurs"  . INTRAOPERATIVE ARTERIOGRAM  10/12/2011   Procedure: INTRA OPERATIVE ARTERIOGRAM;  Surgeon: Elam Dutch, MD;  Location: [place];  Service: Vascular;  Laterality: Left;  . KNEE ARTHROSCOPY Bilateral    "cartilage"  . Laparascopic cryoablation of left kidney  08/2006   Dr. Gladis Riffle for renal cell cancer  . LEFT HEART CATHETERIZATION WITH CORONARY ANGIOGRAM N/A 05/11/2011  Procedure: LEFT HEART CATHETERIZATION WITH CORONARY ANGIOGRAM;  Surgeon: Jolaine Artist, MD;  Location: [facility] CATH LAB;  Service: Cardiovascular;  Laterality: N/A;  . LUNG SURGERY Right    lung nodule removed from the right side  . PR VEIN BYPASS GRAFT,AORTO-FEM-POP  05/27/2010  . SHOULDER ARTHROSCOPY Bilateral    'Spurs"  . TUBAL LIGATION       Social History   reports that she has been smoking cigarettes. She has a 75.00 pack-year smoking history. She has never used smokeless tobacco. She reports that she does not drink alcohol or use drugs.   Family History   Her family history includes Cancer in her father and mother; Heart disease in her father, mother, and sister; Lung cancer in her father and mother.   Allergies No Known Allergies   Home Medications  Prior to Admission medications   Medication Sig Start Date End Date Taking? Authorizing Provider  albuterol (PROVENTIL HFA;VENTOLIN HFA) 108 (90 Base) MCG/ACT inhaler Inhale 1-2 puffs into the lungs every 6 (six) hours as needed for wheezing or shortness of breath. 12/10/17  Yes Carmon Sails J, PA-C  amLODipine (NORVASC) 10 MG tablet Take 10 mg by mouth daily. 02/24/18  Yes [provider]  cilostazol (PLETAL) 100 MG tablet TAKE 1 TABLET BY MOUTH TWICE A DAY BEFORE MEALS Patient taking differently: Take 100 mg by mouth 2 (two) times daily before a meal.  09/15/17  Yes Waynetta Sandy, MD  DULoxetine (CYMBALTA) 60 MG capsule Take 1 capsule (60 mg total) by mouth daily. 03/10/18  Yes Elayne Snare, MD   ferrous sulfate 325 (65 FE) MG tablet Take 325 mg by mouth daily with breakfast.    Yes [provider]  fludrocortisone (FLORINEF) 0.1 MG tablet 1 tablet 3 times a week Patient taking differently: Take 0.1 mg by mouth 3 (three) times a week.  01/14/18  Yes Elayne Snare, MD  gabapentin (NEURONTIN) 300 MG capsule Take 1 capsule (300 mg total) by mouth 2 (two) times daily. 02/24/18  Yes Elayne Snare, MD  HYDROcodone-acetaminophen (NORCO) 10-325 MG tablet Take 1 tablet by mouth 3 (three) times daily as needed (for pain). Follow up with PCP if refills needed 04/24/17  Yes Aline August, MD  hydrOXYzine (ATARAX/VISTARIL) 25 MG tablet Take 1 tablet (25 mg total) by mouth every 6 (six) hours. 10/27/17  Yes Jacqlyn Larsen, PA-C  insulin lispro (HUMALOG) 100 UNIT/ML injection Use 70 units daily in insulin pump 10/22/17  Yes Elayne Snare, MD  lansoprazole (PREVACID) 30 MG capsule Take 1 capsule (30 mg total) by mouth daily. 05/11/16  Yes Geradine Girt, DO  levothyroxine (SYNTHROID, LEVOTHROID) 150 MCG tablet Take 1 tablet (150 mcg total) by mouth daily. 03/10/18  Yes Elayne Snare, MD  metoprolol succinate (TOPROL-XL) 25 MG 24 hr tablet TAKE 1 TABLET BY MOUTH EVERY DAY Patient taking differently: Take 25 mg by mouth daily.  02/01/18  Yes Imogene Burn, PA-C  nortriptyline (PAMELOR) 75 MG capsule Take 75 mg by mouth at bedtime. 01/24/14  Yes [provider]  potassium chloride (KLOR-CON 10) 10 MEQ tablet Take 1 tablet (10 mEq total) by mouth 2 (two) times daily. 02/24/18  Yes Elayne Snare, MD  QUEtiapine (SEROQUEL) 100 MG tablet Take 100 mg by mouth at bedtime. 09/25/16  Yes [provider]  tolterodine (DETROL LA) 4 MG 24 hr capsule Take 4 mg by mouth daily.    Yes [provider]  triamcinolone cream (KENALOG) 0.1 % Apply 1 application  topically 2 (two) times daily. 12/10/17  Yes Kinnie Feil, PA-C  Vitamin D, Ergocalciferol, (DRISDOL) 50000 units CAPS capsule Take 50,000 Units  by mouth once a week. 09/18/16  Yes [provider]  ACCU-CHEK FASTCLIX LANCETS MISC Use 4 per day to check blood sugar dx code E11.9 11/29/14   Elayne Snare, MD  glucose blood (CONTOUR NEXT TEST) test strip Use to test blood sugar 3 times daily E11.9 10/15/17   Elayne Snare, MD  Insulin Pen Needle (BD PEN NEEDLE NANO U/F) 32G X 4 MM MISC Use to inject insulin 10/30/14   Elayne Snare, MD     Critical care time: 49 minutes     Eliseo Gum MSN, AGACNP-BC [place] 03/16/2018, 7:11 PM

## 2018-03-16 NOTE — H&P (Addendum)
History and Physical    April Branch CBS:[id number] DOB: Jan 14, 1946 DOA: 03/16/2018  PCP: [name], MD  Patient coming from: home   I have personally briefly reviewed patient's old medical records in [place] and Care everywhere.   Chief Complaint: weakness of left hand and legs.   HPI: April Branch is a 73 y.o. female with medical history significant of type 2 diabetes on insulin pump, hypothyroidism, hypertension, chronic kidney disease stage III, peripheral vascular disease and coronary artery disease, active smoker and chronic pain problems, chronic left hemiparesis with history of a stroke presents to the emergency room with vague complaints including total weakness of the left side of the body happened 2 days ago, cough and shortness of breath.  Patient is very poor historian.  Patient states that she always have some weakness on the left side, however she was able to walk.  Patient states that about 2 days ago her left side went totally numb and she could not walk, however that was already improving by the time she came to the ER today.  She was seen off balance by her significant other who drove her to the ER.  Patient also complains of worsening cough and wheezing with no fever. Patient has chronic cough, however recently worsened with wheezing.  Denies any sick contacts.  Denies any recent travel.  Does not use oxygen at home.  Continues to smoke.  ED Course: Hemodynamically stable in the ER.  Stroke alert was called, she was out of TPA window.  CT scan of the brain shows chronic vascular changes, no acute changes.  Patient was found sleepy and lethargic.  Chest x-ray shows bronchitis changes with some prominent vascular markings.  Neurology was consulted.  They suggested routine stroke work-up. Patient felt swallow evaluation in the ER, she was coughing with liquids.  Patient may have chronic aspiration.  Review of Systems: As per HPI otherwise 10 point review of systems  negative.    Past Medical History:  Diagnosis Date  . Anemia   . Anxiety   . Arthritis   . CAD (coronary artery disease) 2016   non-obstructive at cath  . Colon polyps 09/11/2010   Tubular adenoma  . COPD 12/14/2008   PATIENT DENIES    . Depression   . Diabetic coma ([place]) Feb. 2014  . Fall at home 2016   x 2 in January 2016, Left knee  . Fibromyalgia   . GERD 07/20/2006  . Headache(784.0)   . History of transient ischemic attack (TIA)   . HPV (human papilloma virus) infection   . Hyperlipidemia   . Hypertension   . Hypothyroidism   . Insulin dependent type 2 diabetes mellitus, controlled ([place]) 1989  . Lumbar disc disease   . Neuromuscular disorder ([place])    PERIPHERAL NEUROPATHY  . Peripheral vascular disease ([place])   . Personal history of malignant neoplasm of kidney(V10.52) 12/14/2008   Laparoscopic biopsy and cryoablation 7/08 Dr. Vernie Shanks   . Renal disorder    chronic kidney disease   . Stroke [facility])    3 MINI STROKES  RT SIDED WEAKNESS  . Vertigo     Past Surgical History:  Procedure Laterality Date  . ABDOMINAL AORTAGRAM N/A 08/21/2011   Procedure: ABDOMINAL Maxcine Ham;  Surgeon: Elam Dutch, MD;  Location: [facility] CATH LAB;  Service: Cardiovascular;  Laterality: N/A;  . ABDOMINAL AORTAGRAM N/A 03/16/2014   Procedure: ABDOMINAL Maxcine Ham;  Surgeon: Elam Dutch, MD;  Location: [facility] CATH LAB;  Service:  Cardiovascular;  Laterality: N/A;  . ABDOMINAL HYSTERECTOMY    . APPENDECTOMY    . BLADDER SUSPENSION    . CARDIAC CATHETERIZATION    . CHOLECYSTECTOMY OPEN    . ESOPHAGOGASTRODUODENOSCOPY (EGD) WITH PROPOFOL N/A 05/16/2014   Procedure: ESOPHAGOGASTRODUODENOSCOPY (EGD) WITH PROPOFOL with Balloon dilation;  Surgeon: Milus Banister, MD;  Location: [place];  Service: Endoscopy;  Laterality: N/A;  . ESOPHAGOGASTRODUODENOSCOPY (EGD) WITH PROPOFOL N/A 09/02/2015   Procedure: ESOPHAGOGASTRODUODENOSCOPY (EGD) WITH PROPOFOL;  Surgeon: Doran Stabler, MD;  Location:  [place];  Service: Gastroenterology;  Laterality: N/A;  . FEMORAL-POPLITEAL BYPASS GRAFT  10/12/2011   Procedure: BYPASS GRAFT FEMORAL-POPLITEAL ARTERY;  Surgeon: Elam Dutch, MD;  Location: [facility] OR;  Service: Vascular;  Laterality: Left;  Left Femoral-Popliteal Bypass Graft using 70mm x 80cm Propaten Graft with intraop arteriogram times one.  . FEMORAL-POPLITEAL BYPASS GRAFT Left 04/17/2014   Procedure: REDO LEFT FEMORAL-POPLITEAL ARTERY BYPASS USING GORE PROPATEN 3mmx80cm GRAFT;  Surgeon: Elam Dutch, MD;  Location: [place];  Service: Vascular;  Laterality: Left;  . FOOT SURGERY Left    "bone spurs"  . INTRAOPERATIVE ARTERIOGRAM  10/12/2011   Procedure: INTRA OPERATIVE ARTERIOGRAM;  Surgeon: Elam Dutch, MD;  Location: [place];  Service: Vascular;  Laterality: Left;  . KNEE ARTHROSCOPY Bilateral    "cartilage"  . Laparascopic cryoablation of left kidney  08/2006   Dr. Gladis Riffle for renal cell cancer  . LEFT HEART CATHETERIZATION WITH CORONARY ANGIOGRAM N/A 05/11/2011   Procedure: LEFT HEART CATHETERIZATION WITH CORONARY ANGIOGRAM;  Surgeon: Jolaine Artist, MD;  Location: [facility] CATH LAB;  Service: Cardiovascular;  Laterality: N/A;  . LUNG SURGERY Right    lung nodule removed from the right side  . PR VEIN BYPASS GRAFT,AORTO-FEM-POP  05/27/2010  . SHOULDER ARTHROSCOPY Bilateral    'Spurs"  . TUBAL LIGATION       reports that she has been smoking cigarettes. She has a 75.00 pack-year smoking history. She has never used smokeless tobacco. She reports that she does not drink alcohol or use drugs.  No Known Allergies  Family History  Problem Relation Age of Onset  . Heart disease Mother   . Lung cancer Mother   . Cancer Mother        Lung  . Heart disease Father   . Lung cancer Father   . Cancer Father        Lung  . Heart disease Sister        CABG- Open Heart     Prior to Admission medications   Medication Sig Start Date End Date Taking? Authorizing Provider  albuterol  (PROVENTIL HFA;VENTOLIN HFA) 108 (90 Base) MCG/ACT inhaler Inhale 1-2 puffs into the lungs every 6 (six) hours as needed for wheezing or shortness of breath. 12/10/17  Yes Carmon Sails J, PA-C  amLODipine (NORVASC) 10 MG tablet Take 10 mg by mouth daily. 02/24/18  Yes [provider]  cilostazol (PLETAL) 100 MG tablet TAKE 1 TABLET BY MOUTH TWICE A DAY BEFORE MEALS Patient taking differently: Take 100 mg by mouth 2 (two) times daily before a meal.  09/15/17  Yes Waynetta Sandy, MD  DULoxetine (CYMBALTA) 60 MG capsule Take 1 capsule (60 mg total) by mouth daily. 03/10/18  Yes Elayne Snare, MD  ferrous sulfate 325 (65 FE) MG tablet Take 325 mg by mouth daily with breakfast.    Yes [provider]  fludrocortisone (FLORINEF) 0.1 MG tablet 1 tablet 3 times a week Patient  taking differently: Take 0.1 mg by mouth 3 (three) times a week.  01/14/18  Yes Elayne Snare, MD  gabapentin (NEURONTIN) 300 MG capsule Take 1 capsule (300 mg total) by mouth 2 (two) times daily. 02/24/18  Yes Elayne Snare, MD  HYDROcodone-acetaminophen (NORCO) 10-325 MG tablet Take 1 tablet by mouth 3 (three) times daily as needed (for pain). Follow up with PCP if refills needed 04/24/17  Yes Aline August, MD  hydrOXYzine (ATARAX/VISTARIL) 25 MG tablet Take 1 tablet (25 mg total) by mouth every 6 (six) hours. 10/27/17  Yes Jacqlyn Larsen, PA-C  insulin lispro (HUMALOG) 100 UNIT/ML injection Use 70 units daily in insulin pump 10/22/17  Yes Elayne Snare, MD  lansoprazole (PREVACID) 30 MG capsule Take 1 capsule (30 mg total) by mouth daily. 05/11/16  Yes Geradine Girt, DO  levothyroxine (SYNTHROID, LEVOTHROID) 150 MCG tablet Take 1 tablet (150 mcg total) by mouth daily. 03/10/18  Yes Elayne Snare, MD  metoprolol succinate (TOPROL-XL) 25 MG 24 hr tablet TAKE 1 TABLET BY MOUTH EVERY DAY Patient taking differently: Take 25 mg by mouth daily.  02/01/18  Yes Imogene Burn, PA-C  nortriptyline (PAMELOR) 75 MG capsule Take  75 mg by mouth at bedtime. 01/24/14  Yes [provider]  potassium chloride (KLOR-CON 10) 10 MEQ tablet Take 1 tablet (10 mEq total) by mouth 2 (two) times daily. 02/24/18  Yes Elayne Snare, MD  QUEtiapine (SEROQUEL) 100 MG tablet Take 100 mg by mouth at bedtime. 09/25/16  Yes [provider]  tolterodine (DETROL LA) 4 MG 24 hr capsule Take 4 mg by mouth daily.    Yes [provider]  triamcinolone cream (KENALOG) 0.1 % Apply 1 application topically 2 (two) times daily. 12/10/17  Yes Kinnie Feil, PA-C  Vitamin D, Ergocalciferol, (DRISDOL) 50000 units CAPS capsule Take 50,000 Units by mouth once a week. 09/18/16  Yes [provider]  ACCU-CHEK FASTCLIX LANCETS MISC Use 4 per day to check blood sugar dx code E11.9 11/29/14   Elayne Snare, MD  glucose blood (CONTOUR NEXT TEST) test strip Use to test blood sugar 3 times daily E11.9 10/15/17   Elayne Snare, MD  Insulin Pen Needle (BD PEN NEEDLE NANO U/F) 32G X 4 MM MISC Use to inject insulin 10/30/14   Elayne Snare, MD    Physical Exam: Vitals:   03/16/18 1030 03/16/18 1045 03/16/18 1115 03/16/18 1245  BP: (!) 129/56 (!) 139/54 (!) 139/57 (!) 136/49  Pulse: 78 78 79 86  Resp: 20 (!) 21 (!) 21 (!) 22  Temp:      TempSrc:      SpO2: 92% 90% (!) 89% (!) 89%  Weight:      Height:        Constitutional: NAD, calm, comfortable Vitals:   03/16/18 1030 03/16/18 1045 03/16/18 1115 03/16/18 1245  BP: (!) 129/56 (!) 139/54 (!) 139/57 (!) 136/49  Pulse: 78 78 79 86  Resp: 20 (!) 21 (!) 21 (!) 22  Temp:      TempSrc:      SpO2: 92% 90% (!) 89% (!) 89%  Weight:      Height:       Eyes: PERRL, lids and conjunctivae normal ENMT: Mucous membranes are moist. Posterior pharynx clear of any exudate or lesions.Normal dentition.  Neck: normal, supple, no masses, no thyromegaly Respiratory:   She does have some expiratory wheezes otherwise normal bilateral air entry.  Normal respiratory effort. No accessory muscle use.  Cardiovascular: Regular rate and rhythm, no murmurs / rubs / gallops. No extremity edema. 2+ pedal pulses. No carotid bruits.  Abdomen: no tenderness, no masses palpated. No hepatosplenomegaly. Bowel sounds positive.  Musculoskeletal: no clubbing / cyanosis. No joint deformity upper and lower extremities. Good ROM, no contractures. Normal muscle tone.   Skin: no rashes, lesions, ulcers. No induration Neurologic: CN 2-12 grossly intact.  Light touch sensation decreased on left side.  Intact, DTR normal. Strength 5/5 in right side. Left upper and lower extremity 4+/5. Psychiatric: Normal judgment and insight. Alert and oriented x 3.  Flat affect.    Labs on Admission: I have personally reviewed following labs and imaging studies  CBC: Recent Labs  Lab 03/16/18 0617 03/16/18 0753  WBC 11.8*  --   NEUTROABS 8.8*  --   HGB 10.3* 10.9*  HCT 32.4* 32.0*  MCV 84.2  --   PLT 359  --    Basic Metabolic Panel: Recent Labs  Lab 03/16/18 0617 03/16/18 0753  NA 134* 136  K 4.3 4.3  CL 102  --   CO2 19*  --   GLUCOSE 105*  --   BUN 29*  --   CREATININE 1.97*  --   CALCIUM 9.0  --    GFR: Estimated Creatinine Clearance: 24.1 mL/min (A) (by C-G formula based on SCr of 1.97 mg/dL (H)). Liver Function Tests: Recent Labs  Lab 03/16/18 0617  AST 15  ALT 14  ALKPHOS 125  BILITOT 0.6  PROT 7.6  ALBUMIN 3.4*   No results for input(s): LIPASE, AMYLASE in the last 168 hours. Recent Labs  Lab 03/16/18 0737  AMMONIA 13   Coagulation Profile: Recent Labs  Lab 03/16/18 0617  INR 1.04   Cardiac Enzymes: No results for input(s): CKTOTAL, CKMB, CKMBINDEX, TROPONINI in the last 168 hours. BNP (last 3 results) No results for input(s): PROBNP in the last 8760 hours. HbA1C: No results for input(s): HGBA1C in the last 72 hours. CBG: Recent Labs  Lab 03/16/18 0743  GLUCAP 85   Lipid Profile: No results for input(s): CHOL, HDL, LDLCALC, TRIG, CHOLHDL, LDLDIRECT in the last 72  hours. Thyroid Function Tests: Recent Labs    03/16/18 0911  TSH 0.047*   Anemia Panel: No results for input(s): VITAMINB12, FOLATE, FERRITIN, TIBC, IRON, RETICCTPCT in the last 72 hours. Urine analysis:    Component Value Date/Time   COLORURINE YELLOW 08/17/2017 1411   APPEARANCEUR CLEAR 08/17/2017 1411   LABSPEC >=1.030 (A) 08/17/2017 1411   PHURINE 5.5 08/17/2017 1411   GLUCOSEU 250 (A) 08/17/2017 1411   HGBUR TRACE-INTACT (A) 08/17/2017 1411   BILIRUBINUR NEGATIVE 08/17/2017 1411   KETONESUR NEGATIVE 08/17/2017 1411   PROTEINUR NEGATIVE 04/23/2017 0035   UROBILINOGEN 0.2 08/17/2017 1411   NITRITE NEGATIVE 08/17/2017 1411   LEUKOCYTESUR NEGATIVE 08/17/2017 1411    Radiological Exams on Admission: Dg Abdomen 1 View  Result Date: 03/16/2018 CLINICAL DATA:  Poor historian.  Evaluate for metallic foreign body. EXAM: ABDOMEN - 1 VIEW COMPARISON:  None. FINDINGS: No definitive ingested radiopaque foreign body though there is exclusion of the lateral-most aspect the right mid hemiabdomen. Moderate to large colonic stool burden without evidence of enteric obstruction. Nondiagnostic evaluation for pneumoperitoneum secondary to supine positioning and exclusion of the lower thorax. No pneumatosis or portal venous gas. Post cholecystectomy. Punctate phleboliths overlies the right hemipelvis. Degenerative change the lower thoracic and lumbar spine. Atherosclerotic plaque overlies the abdominal aorta and pelvic vasculature. IMPRESSION: 1. No ingested radiopaque foreign body.  2. Moderate to large colonic stool burden without evidence of enteric obstruction. Electronically Signed   By: Sandi Mariscal M.D.   On: 03/16/2018 10:21   Ct Head Wo Contrast  Result Date: 03/16/2018 CLINICAL DATA:  Possible stroke. Ataxia with left-sided arm and leg weakness. EXAM: CT HEAD WITHOUT CONTRAST TECHNIQUE: Contiguous axial images were obtained from the base of the skull through the vertex without intravenous  contrast. COMPARISON:  04/22/2017 FINDINGS: Brain: No evidence of acute infarction, hemorrhage, hydrocephalus, extra-axial collection or mass lesion/mass effect. Mild chronic small vessel ischemic type change in the cerebral white matter. Vascular: Atherosclerotic calcification.  No hyperdense vessel Skull: No acute finding Sinuses/Orbits: Endoscopic sinus surgery on the right. There is volume loss at the right ethmoids with relatively expanded right orbit. Bilateral cataract resection. IMPRESSION: No acute finding. Electronically Signed   By: Monte Fantasia M.D.   On: 03/16/2018 07:16   Dg Chest Portable 1 View  Result Date: 03/16/2018 CLINICAL DATA:  Shortness of breath and hypoxia EXAM: PORTABLE CHEST 1 VIEW COMPARISON:  12/10/2017 FINDINGS: Cardiopericardial enlargement, increased from prior. Diffuse interstitial coarsening that could be congestive or bronchitic. There is a linear density in the right suprahilar lung that is scarring when compared to priors. No effusion or pneumothorax. IMPRESSION: Increased cardiopericardial size with generalized congestive or bronchitic interstitial coarsening. Electronically Signed   By: Monte Fantasia M.D.   On: 03/16/2018 08:14    EKG: Independently reviewed.  Normal sinus rhythm.  No acute ST-T wave changes.  Comparable to previous EKG.  Assessment/Plan Principal Problem:   Left-sided weakness Active Problems:   Hypothyroidism   Insulin dependent type 2 diabetes mellitus, controlled (HCC)   DISORDER, TOBACCO USE   Essential hypertension   COPD (chronic obstructive pulmonary disease) (HCC)   CAD (coronary artery disease)   Acute kidney injury superimposed on CKD (HCC)   CKD (chronic kidney disease), stage III (HCC)   Wheezy bronchitis   TIA (transient ischemic attack)   Left-sided weakness: Unknown whether acute or chronic.  Patient does have history of left-sided weakness with TIA in the past.  Patient is high risk for stroke given all her risk  factors. Admit to telemetry.  Neurochecks and vital signs as per stroke protocol.  Patient will be started on rectal aspirin. MRI of the brain could not be completed, patient is anxious and would not stay still.  We need to reattempt tomorrow morning if she develops any other weakness, however we can treat her like TIA. We will check hemoglobin A1c and lipid panel.   Speech, OT PT.  Neurology will follow the patient. Check carotid duplexes and 2D echocardiogram.  Acute wheezy bronchitis: With history of COPD. On a patient with COPD in a smoker.  Will continue doxycycline for 5 days.  Aggressive bronchodilator therapy, inhalational steroids, flutter therapy and deep breathing exercises.  Counseled smoking cessation.  Patient was given a dose of oral steroids in the ER, she has brittle diabetes, will avoid systemic steroids.  Acute kidney injury on chronic kidney disease stage III due to hypertension: No evidence of urinary obstruction.  Will avoid IV fluids given cardiomegaly and elevated BNP on chest x-ray. We will recheck levels in the morning to ensure stabilization.  Type 2 diabetes on insulin: Patient uses basal insulin about 70 units via insulin pump.  Unreliable oral intake.  Will keep patient on half dose of basal insulin and sliding scale insulin.  Check A1c.  Hypertension: Fairly stable.  Resume home medications.  Hypothyroidism:  TSH is 0.47.  Will need repeat TSH in about a month at the ambulatory setting.  We will continue Synthroid 125 now.  Smoker: Counseled to quit.  Less likely to quit.  Will provide with nicotine patch.    DVT prophylaxis: Lovenox. Code Status: Full code. Family Communication: No family at the bedside. Disposition Plan: Home with home care. Consults called: Neurology. Admission status: Observation.   Barb Merino MD [name] Pager [phone]  If 7PM-7AM, please contact night-coverage [email or web address] Password [email or web address]  03/16/2018, 1:17 PM     Addendum: 6PM Was called to see patient with increased work of breathing and hypoxia. Patient seen and examined at bedside. She is restless, oxygen 88-90% on venturi mask. RR 26, blood pressures are stable.  CXR , shows prominent vascular markings, no pneumothorax. ABG , 7.33/ PCO2 of 37, PO2 of 47.  Plan; CCM consult for increased work of breathing and high flow oxygen requirement. May need to go to ICU or step down unit. Changing to IV doxycycline. Change to IV steroids and first dose now.  Aggressive bronchodilator.  Change essential medicine to IV and keep NPO until seen by speech in morning.

## 2018-03-16 NOTE — ED Provider Notes (Signed)
Assumed care from Dr. Roxanne Mins at 0:25 AM. Briefly, the patient is a 73 y.o. female with PMHx of  has a past medical history of Anemia, Anxiety, Arthritis, CAD (coronary artery disease) (2016), Colon polyps (09/11/2010), COPD (12/14/2008), Depression, Diabetic coma ([place]) (Feb. 2014), Fall at home (2016), Fibromyalgia, GERD (07/20/2006), Headache(784.0), History of transient ischemic attack (TIA), HPV (human papilloma virus) infection, Hyperlipidemia, Hypertension, Hypothyroidism, Insulin dependent type 2 diabetes mellitus, controlled ([place]) (1989), Lumbar disc disease, Neuromuscular disorder ([place]), Peripheral vascular disease ([place]), Personal history of malignant neoplasm of kidney(V10.52) (12/14/2008), Renal disorder, Stroke ([place]), and Vertigo. here with new left-sided weakness. Neuro consulted, CT head neg, plan to admit ot medicine for stroke/AMS eval.   Labs Reviewed  CBC - Abnormal; Notable for the following components:      Result Value   WBC 11.8 (*)    RBC 3.85 (*)    Hemoglobin 10.3 (*)    HCT 32.4 (*)    All other components within normal limits  DIFFERENTIAL - Abnormal; Notable for the following components:   Neutro Abs 8.8 (*)    All other components within normal limits  COMPREHENSIVE METABOLIC PANEL - Abnormal; Notable for the following components:   Sodium 134 (*)    CO2 19 (*)    Glucose, Bld 105 (*)    BUN 29 (*)    Creatinine, Ser 1.97 (*)    Albumin 3.4 (*)    GFR calc non Af Amer 25 (*)    GFR calc Af Amer 29 (*)    All other components within normal limits  PROTIME-INR  APTT  ETHANOL  RAPID URINE DRUG SCREEN, HOSP PERFORMED  URINALYSIS, ROUTINE W REFLEX MICROSCOPIC  AMMONIA  I-STAT TROPONIN, ED  CBG MONITORING, ED    Course of Care: -Notified of nursing that pt hypoxic asleep. She is on multiple sedating meds. Protecting airway on my assessment. Will trial duoneb given h/o COPD. Of note, VBG obtained and shows no signs of retention. CBG wnl. -pt remains drowsy but  breathing improved with nebs. CXR obtained and shows congestive or bronchitis coarsening. Pt endorses recent increased cough, wheezing. Will tx with doxy, low-dose prednisone - could certainly be contributing to her AMS. Stroke work-up recommended as well by Neuro. Admit to medicine.    Duffy Bruce, MD 03/16/18 [phone]

## 2018-03-16 NOTE — ED Notes (Signed)
Attempted to tell pt we need urine sample and she fell asleep while I was talking to her.

## 2018-03-16 NOTE — ED Provider Notes (Signed)
[facility] EMERGENCY DEPARTMENT Provider Note   CSN: [id number] Arrival date & time: 03/16/18  0604     History   Chief Complaint Chief Complaint  Patient presents with  . Stroke Symptoms    HPI April Branch is a 73 y.o. female.  The history is provided by the patient.  She has history of hypertension, diabetes, hyperlipidemia, transient ischemic attack, coronary artery disease, COPD and comes in because of left-sided weakness.  She was last known normal at 1PM.  She is having difficulty walking.  Husband is noted that she is shaking.  She initially had a headache, but that has resolved.  She denies vision change and denies nausea or vomiting.  Past Medical History:  Diagnosis Date  . Anemia   . Anxiety   . Arthritis   . CAD (coronary artery disease) 2016   non-obstructive at cath  . Colon polyps 09/11/2010   Tubular adenoma  . COPD 12/14/2008   PATIENT DENIES    . Depression   . Diabetic coma ([place]) Feb. 2014  . Fall at home 2016   x 2 in January 2016, Left knee  . Fibromyalgia   . GERD 07/20/2006  . Headache(784.0)   . History of transient ischemic attack (TIA)   . HPV (human papilloma virus) infection   . Hyperlipidemia   . Hypertension   . Hypothyroidism   . 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 Lumbar disc disease   . Neuromuscular disorder ([place])    PERIPHERAL NEUROPATHY  . Peripheral vascular disease ([place])   . Personal history of malignant neoplasm of kidney(V10.52) 12/14/2008   Laparoscopic biopsy and cryoablation 7/08 Dr. Vernie Shanks   . Renal disorder    chronic kidney disease   . Stroke [facility])    3 MINI STROKES  RT SIDED WEAKNESS  . Vertigo     Patient Active Problem List   Diagnosis Date Noted  . Dizziness 04/23/2017  . Hyperglycemia 10/31/2016  . Closed fracture of transverse process of lumbar vertebra ([place]) 10/31/2016  . Hematoma 10/31/2016  . Recurrent falls 10/31/2016  . Syncope  10/31/2016  . Gait abnormality 10/19/2016  . Diabetic peripheral neuropathy ([place]) 10/19/2016  . Symptomatic bradycardia 10/13/2016  . Elevated troponin   . Bradycardia 10/12/2016  . General weakness 10/12/2016  . CKD (chronic kidney disease), stage III ([place]) 10/12/2016  . Weakness   . Orthostatic syncope 05/10/2016  . Colitis 05/10/2016  . Anemia 05/10/2016  . Hypomagnesemia 05/10/2016  . Food impaction of esophagus 09/02/2015  . Diabetic foot ulcer ([place]) 06/24/2015  . Atheroscler of bypass graft of left leg with intermittent claudication ([place]) 04/17/2014  . Carotid stenosis 05/19/2013  . Chest pain 09/21/2012  . Acute kidney injury (nontraumatic) ([place]) 09/21/2012  . Chronic pain syndrome 03/13/2012  . Acute kidney injury ([place]) 03/13/2012  . Atherosclerosis of native artery of extremity with intermittent claudication ([place]) 10/08/2011  . PVD (peripheral vascular disease) ([place]) 07/30/2011  . CAD (coronary artery disease) 05/10/2011  . Hyperlipidemia   . Unstable angina ([place]) 05/09/2011  . CERVICAL CANCER 12/14/2008  . Personal history of malignant neoplasm of kidney(V10.52) 12/14/2008  . COPD (chronic obstructive pulmonary disease) ([place]) 12/14/2008  . IBS 12/14/2008  . Essential hypertension   . Personal history of colonic polyps 08/13/2008  . DISORDER, TOBACCO USE 08/04/2006  . Hypothyroidism   . Insulin dependent type 2 diabetes mellitus, controlled ([place])   . Lumbar disc disease   . ANXIETY STATE NOS  07/20/2006  . GERD 07/20/2006  . HX, PERSONAL, VENOUS THROMBOSIS/EMBOLISM 07/20/2006    Past Surgical History:  Procedure Laterality Date  . ABDOMINAL AORTAGRAM N/A 08/21/2011   Procedure: ABDOMINAL Maxcine Ham;  Surgeon: Elam Dutch, MD;  Location: [facility] CATH LAB;  Service: Cardiovascular;  Laterality: N/A;  . ABDOMINAL AORTAGRAM N/A 03/16/2014   Procedure: ABDOMINAL Maxcine Ham;  Surgeon: Elam Dutch, MD;  Location: [facility] CATH LAB;  Service: Cardiovascular;  Laterality: N/A;   . ABDOMINAL HYSTERECTOMY    . APPENDECTOMY    . BLADDER SUSPENSION    . CARDIAC CATHETERIZATION    . CHOLECYSTECTOMY OPEN    . ESOPHAGOGASTRODUODENOSCOPY (EGD) WITH PROPOFOL N/A 05/16/2014   Procedure: ESOPHAGOGASTRODUODENOSCOPY (EGD) WITH PROPOFOL with Balloon dilation;  Surgeon: Milus Banister, MD;  Location: [facility];  Service: Endoscopy;  Laterality: N/A;  . ESOPHAGOGASTRODUODENOSCOPY (EGD) WITH PROPOFOL N/A 09/02/2015   Procedure: ESOPHAGOGASTRODUODENOSCOPY (EGD) WITH PROPOFOL;  Surgeon: Doran Stabler, MD;  Location: [place];  Service: Gastroenterology;  Laterality: N/A;  . FEMORAL-POPLITEAL BYPASS GRAFT  10/12/2011   Procedure: BYPASS GRAFT FEMORAL-POPLITEAL ARTERY;  Surgeon: Elam Dutch, MD;  Location[facility] OR;  Service: Vascular;  Laterality: Left;  Left Femoral-Popliteal Bypass Graft using 16mm x 80cm Propaten Graft with intraop arteriogram times one.  . FEMORAL-POPLITEAL BYPASS GRAFT Left 04/17/2014   Procedure: REDO LEFT FEMORAL-POPLITEAL ARTERY BYPASS USING GORE PROPATEN 67mmx80cm GRAFT;  Surgeon: Elam Dutch, MD;  Location: [place];  Service: Vascular;  Laterality: Left;  . FOOT SURGERY Left    "bone spurs"  . INTRAOPERATIVE ARTERIOGRAM  10/12/2011   Procedure: INTRA OPERATIVE ARTERIOGRAM;  Surgeon: Elam Dutch, MD;  Location: [place];  Service: Vascular;  Laterality: Left;  . KNEE ARTHROSCOPY Bilateral    "cartilage"  . Laparascopic cryoablation of left kidney  08/2006   Dr. Gladis Riffle for renal cell cancer  . LEFT HEART CATHETERIZATION WITH CORONARY ANGIOGRAM N/A 05/11/2011   Procedure: LEFT HEART CATHETERIZATION WITH CORONARY ANGIOGRAM;  Surgeon: Jolaine Artist, MD;  Location: [facility] CATH LAB;  Service: Cardiovascular;  Laterality: N/A;  . LUNG SURGERY Right    lung nodule removed from the right side  . PR VEIN BYPASS GRAFT,AORTO-FEM-POP  05/27/2010  . SHOULDER ARTHROSCOPY Bilateral    'Spurs"  . TUBAL LIGATION       OB History   No obstetric history on  file.      Home Medications    Prior to Admission medications   Medication Sig Start Date End Date Taking? Authorizing Provider  ACCU-CHEK FASTCLIX LANCETS MISC Use 4 per day to check blood sugar dx code E11.9 11/29/14   Elayne Snare, MD  albuterol (PROVENTIL HFA;VENTOLIN HFA) 108 (90 Base) MCG/ACT inhaler Inhale 1-2 puffs into the lungs every 6 (six) hours as needed for wheezing or shortness of breath. 12/10/17   Kinnie Feil, PA-C  cilostazol (PLETAL) 100 MG tablet TAKE 1 TABLET BY MOUTH TWICE A DAY BEFORE MEALS 09/15/17   Waynetta Sandy, MD  clopidogrel (PLAVIX) 75 MG tablet Take 75 mg by mouth daily.    [provider]  doxycycline (VIBRAMYCIN) 100 MG capsule Take 1 capsule (100 mg total) by mouth 2 (two) times daily. 12/10/17   Kinnie Feil, PA-C  DULoxetine (CYMBALTA) 60 MG capsule Take 1 capsule (60 mg total) by mouth daily. 03/10/18   Elayne Snare, MD  ferrous sulfate 325 (65 FE) MG tablet Take 325 mg by mouth daily with breakfast.     [provider]  fludrocortisone (FLORINEF) 0.1 MG tablet 1 tablet 3 times a week 01/14/18   Elayne Snare, MD  gabapentin (NEURONTIN) 300 MG capsule Take 1 capsule (300 mg total) by mouth 2 (two) times daily. 02/24/18   Elayne Snare, MD  glucose blood (CONTOUR NEXT TEST) test strip Use to test blood sugar 3 times daily E11.9 10/15/17   Elayne Snare, MD  HYDROcodone-acetaminophen [facility]) 10-325 MG tablet Take 1 tablet by mouth 3 (three) times daily as needed (for pain). Follow up with PCP if refills needed 04/24/17   Aline August, MD  hydrOXYzine (ATARAX/VISTARIL) 25 MG tablet Take 1 tablet (25 mg total) by mouth every 6 (six) hours. 10/27/17   Jacqlyn Larsen, PA-C  insulin lispro (HUMALOG) 100 UNIT/ML injection Use 70 units daily in insulin pump 10/22/17   Elayne Snare, MD  Insulin Pen Needle (BD PEN NEEDLE NANO U/F) 32G X 4 MM MISC Use to inject insulin 10/30/14   Elayne Snare, MD  lansoprazole (PREVACID) 30 MG capsule Take 1  capsule (30 mg total) by mouth daily. 05/11/16   Geradine Girt, DO  levothyroxine (SYNTHROID, LEVOTHROID) 150 MCG tablet Take 1 tablet (150 mcg total) by mouth daily. 03/10/18   Elayne Snare, MD  metoprolol succinate (TOPROL-XL) 25 MG 24 hr tablet TAKE 1 TABLET BY MOUTH EVERY DAY 02/01/18   Imogene Burn, PA-C  nortriptyline (PAMELOR) 75 MG capsule Take 75 mg by mouth at bedtime. 01/24/14   [provider]  potassium chloride (KLOR-CON 10) 10 MEQ tablet Take 1 tablet (10 mEq total) by mouth 2 (two) times daily. 02/24/18   Elayne Snare, MD  pravastatin (PRAVACHOL) 20 MG tablet Take 20 mg by mouth daily.    [provider]  QUEtiapine (SEROQUEL) 100 MG tablet Take 100 mg by mouth at bedtime. 09/25/16   [provider]  tolterodine (DETROL LA) 4 MG 24 hr capsule Take 4 mg by mouth daily.     [provider]  triamcinolone cream (KENALOG) 0.1 % Apply 1 application topically 2 (two) times daily. 12/10/17   Kinnie Feil, PA-C  Vitamin D, Ergocalciferol, (DRISDOL) 50000 units CAPS capsule Take 50,000 Units by mouth once a week. 09/18/16   [provider]    Family History Family History  Problem Relation Age of Onset  . Heart disease Mother   . Lung cancer Mother   . Cancer Mother        Lung  . Heart disease Father   . Lung cancer Father   . Cancer Father        Lung  . Heart disease Sister        CABG- Open Heart    Social History Social History   Tobacco Use  . Smoking status: Current Every Day Smoker    Packs/day: 1.50    Years: 50.00    Pack years: 75.00    Types: Cigarettes    Last attempt to quit: 06/24/2014    Years since quitting: 3.7  . Smokeless tobacco: Never Used  Substance Use Topics  . Alcohol use: No    Alcohol/week: 0.0 standard drinks  . Drug use: No     Allergies   Patient has no known allergies.   Review of Systems Review of Systems  All other systems reviewed and are negative.    Physical Exam Updated  Vital Signs BP 98/71 (BP Location: Left Arm)   Pulse 76   Temp 98.1 F (36.7 C) (Oral)   Resp 16  Ht 5\' 1"  (1.549 m)   Wt 71.7 kg   SpO2 93%   BMI 29.85 kg/m   Physical Exam Vitals signs and nursing note reviewed.    73 year old female, resting comfortably and in no acute distress. Vital signs are normal. Oxygen saturation is 93%, which is normal. Head is normocephalic and atraumatic. PERRLA, EOMI. Oropharynx is clear. Neck is nontender and supple without adenopathy or JVD. Back is nontender and there is no CVA tenderness. Lungs are clear without rales, wheezes, or rhonchi. Chest is nontender. Heart has regular rate and rhythm without murmur. Abdomen is soft, flat, nontender without masses or hepatosplenomegaly and peristalsis is normoactive. Extremities have no cyanosis or edema, full range of motion is present. Skin is warm and dry without rash. Neurologic: Mental status is normal, speech is normal, cranial nerves are intact, there are no sensory deficits.  There is moderately severe weakness of the left leg with strength 3/5 but normal strength on testing of the left arm with strength 5/5.  Normal strength in the right arm and right leg.  There is no pronator drift.  On finger-to-nose testing, there is moderate ataxia which is greater on the right than on the left.  ED Treatments / Results  Labs (all labs ordered are listed, but only abnormal results are displayed) Labs Reviewed  CBC - Abnormal; Notable for the following components:      Result Value   WBC 11.8 (*)    RBC 3.85 (*)    Hemoglobin 10.3 (*)    HCT 32.4 (*)    All other components within normal limits  DIFFERENTIAL - Abnormal; Notable for the following components:   Neutro Abs 8.8 (*)    All other components within normal limits  COMPREHENSIVE METABOLIC PANEL - Abnormal; Notable for the following components:   Sodium 134 (*)    CO2 19 (*)    Glucose, Bld 105 (*)    BUN 29 (*)    Creatinine, Ser 1.97 (*)     Albumin 3.4 (*)    GFR calc non Af Amer 25 (*)    GFR calc Af Amer 29 (*)    All other components within normal limits  PROTIME-INR  APTT  ETHANOL  RAPID URINE DRUG SCREEN, HOSP PERFORMED  URINALYSIS, ROUTINE W REFLEX MICROSCOPIC  AMMONIA  I-STAT TROPONIN, ED  CBG MONITORING, ED    EKG EKG Interpretation  Date/Time:  Wednesday March 16 2018 06:14:40 EST Ventricular Rate:  72 PR Interval:  202 QRS Duration: 98 QT Interval:  386 QTC Calculation: 422 R Axis:   87 Text Interpretation:  Normal sinus rhythm Normal ECG When compared with ECG of 04/29/2017, No significant change was found Confirmed by Delora Fuel ([id number]) on 03/16/2018 6:38:17 AM   Radiology Ct Head Wo Contrast  Result Date: 03/16/2018 CLINICAL DATA:  Possible stroke. Ataxia with left-sided arm and leg weakness. EXAM: CT HEAD WITHOUT CONTRAST TECHNIQUE: Contiguous axial images were obtained from the base of the skull through the vertex without intravenous contrast. COMPARISON:  04/22/2017 FINDINGS: Brain: No evidence of acute infarction, hemorrhage, hydrocephalus, extra-axial collection or mass lesion/mass effect. Mild chronic small vessel ischemic type change in the cerebral white matter. Vascular: Atherosclerotic calcification.  No hyperdense vessel Skull: No acute finding Sinuses/Orbits: Endoscopic sinus surgery on the right. There is volume loss at the right ethmoids with relatively expanded right orbit. Bilateral cataract resection. IMPRESSION: No acute finding. Electronically Signed   By: Monte Fantasia M.D.   On: 03/16/2018  07:16    Procedures Procedures  CRITICAL CARE Performed by: Delora Fuel Total critical care time: 45 minutes Critical care time was exclusive of separately billable procedures and treating other patients. Critical care was necessary to treat or prevent imminent or life-threatening deterioration. Critical care was time spent personally by me on the following activities: development of  treatment plan with patient and/or surrogate as well as nursing, discussions with consultants, evaluation of patient's response to treatment, examination of patient, obtaining history from patient or surrogate, ordering and performing treatments and interventions, ordering and review of laboratory studies, ordering and review of radiographic studies, pulse oximetry and re-evaluation of patient's condition.  Medications Ordered in ED Medications - No data to display   Initial Impression / Assessment and Plan / ED Course  I have reviewed the triage vital signs and the nursing notes.  Pertinent labs & imaging results that were available during my care of the patient were reviewed by me and considered in my medical decision making (see chart for details).  Focal weakness and ataxia strongly suggestive of stroke.  Old records are reviewed, and she has no relevant past visits.  She is clearly outside the window for thrombolytics, but potentially within the window for endovascular treatment.  Case has been discussed with Dr. Lorraine Lax of neurology service who requests that code stroke not be activated, but patient get CT angiogram of head and neck in addition to head CT.  This is ordered.  CT of head is unremarkable.  CT angiogram is pending.  Labs show renal insufficiency which is slightly worse than baseline weight, and anemia which is unchanged from baseline.  Case is signed out to Dr. Ellender Hose.  Final Clinical Impressions(s) / ED Diagnoses   Final diagnoses:  Cerebrovascular accident (CVA), unspecified mechanism ([place])  Renal insufficiency  Normochromic normocytic anemia    ED Discharge Orders    None       Delora Fuel, MD 31/54/00 [phone]

## 2018-03-16 NOTE — ED Notes (Signed)
Pt going to MRI

## 2018-03-16 NOTE — ED Triage Notes (Signed)
Per pt she was having some weakness in her left side. Pt stated she is not able to move her left leg and arm very well. Symptoms started yesterday at about 1pm. Blurred vision.

## 2018-03-17 ENCOUNTER — Inpatient Hospital Stay (HOSPITAL_COMMUNITY): Payer: Medicare Other

## 2018-03-17 DIAGNOSIS — Z7189 Other specified counseling: Secondary | ICD-10-CM

## 2018-03-17 DIAGNOSIS — J441 Chronic obstructive pulmonary disease with (acute) exacerbation: Secondary | ICD-10-CM

## 2018-03-17 DIAGNOSIS — R531 Weakness: Secondary | ICD-10-CM

## 2018-03-17 DIAGNOSIS — E039 Hypothyroidism, unspecified: Secondary | ICD-10-CM

## 2018-03-17 DIAGNOSIS — I5031 Acute diastolic (congestive) heart failure: Secondary | ICD-10-CM

## 2018-03-17 DIAGNOSIS — R0602 Shortness of breath: Secondary | ICD-10-CM

## 2018-03-17 DIAGNOSIS — J810 Acute pulmonary edema: Secondary | ICD-10-CM

## 2018-03-17 DIAGNOSIS — I10 Essential (primary) hypertension: Secondary | ICD-10-CM

## 2018-03-17 DIAGNOSIS — N189 Chronic kidney disease, unspecified: Secondary | ICD-10-CM

## 2018-03-17 DIAGNOSIS — J438 Other emphysema: Secondary | ICD-10-CM

## 2018-03-17 DIAGNOSIS — N179 Acute kidney failure, unspecified: Secondary | ICD-10-CM

## 2018-03-17 LAB — LIPID PANEL
Cholesterol: 124 mg/dL (ref 0–200)
HDL: 47 mg/dL (ref >40)
LDL Cholesterol: 64 mg/dL (ref 0–99)
Total CHOL/HDL Ratio: 2.6 RATIO
Triglycerides: 66 mg/dL (ref <150)
VLDL: 13 mg/dL (ref 0–40)

## 2018-03-17 LAB — RENAL FUNCTION PANEL
ANION GAP: 11 (ref 5–15)
Albumin: 2.9 g/dL — ABNORMAL LOW (ref 3.5–5.0)
BUN: 29 mg/dL — ABNORMAL HIGH (ref 8–23)
CO2: 21 mmol/L — ABNORMAL LOW (ref 22–32)
Calcium: 8.6 mg/dL — ABNORMAL LOW (ref 8.9–10.3)
Chloride: 104 mmol/L (ref 98–111)
Creatinine, Ser: 1.94 mg/dL — ABNORMAL HIGH (ref 0.44–1.00)
GFR calc non Af Amer: 26 mL/min — ABNORMAL LOW (ref >60)
GFR, EST AFRICAN AMERICAN: 30 mL/min — AB (ref >60)
Glucose, Bld: 164 mg/dL — ABNORMAL HIGH (ref 70–99)
Phosphorus: 4.1 mg/dL (ref 2.5–4.6)
Potassium: 3.5 mmol/L (ref 3.5–5.1)
Sodium: 136 mmol/L (ref 135–145)

## 2018-03-17 LAB — GLUCOSE, CAPILLARY
GLUCOSE-CAPILLARY: 145 mg/dL — AB (ref 70–99)
GLUCOSE-CAPILLARY: 193 mg/dL — AB (ref 70–99)
Glucose-Capillary: 159 mg/dL — ABNORMAL HIGH (ref 70–99)
Glucose-Capillary: 195 mg/dL — ABNORMAL HIGH (ref 70–99)
Glucose-Capillary: 318 mg/dL — ABNORMAL HIGH (ref 70–99)

## 2018-03-17 LAB — HEMOGLOBIN A1C
Hgb A1c MFr Bld: 7.5 % — ABNORMAL HIGH (ref 4.8–5.6)
Mean Plasma Glucose: 168.55 mg/dL

## 2018-03-17 LAB — CBC
HCT: 31.8 % — ABNORMAL LOW (ref 36.0–46.0)
Hemoglobin: 10.1 g/dL — ABNORMAL LOW (ref 12.0–15.0)
MCH: 26 pg (ref 26.0–34.0)
MCHC: 31.8 g/dL (ref 30.0–36.0)
MCV: 81.7 fL (ref 80.0–100.0)
Platelets: 345 10*3/uL (ref 150–400)
RBC: 3.89 MIL/uL (ref 3.87–5.11)
RDW: 14.3 % (ref 11.5–15.5)
WBC: 17 10*3/uL — ABNORMAL HIGH (ref 4.0–10.5)
nRBC: 0 % (ref 0.0–0.2)

## 2018-03-17 MED ORDER — LEVOTHYROXINE SODIUM 100 MCG/5ML IV SOLN
75.0000 ug | Freq: Every day | INTRAVENOUS | Status: DC
  Administered 2018-03-17: 75 ug via INTRAVENOUS
  Filled 2018-03-17 (×2): qty 5

## 2018-03-17 MED ORDER — POTASSIUM CHLORIDE 10 MEQ/100ML IV SOLN
10.0000 meq | INTRAVENOUS | Status: AC
  Administered 2018-03-17 (×3): 10 meq via INTRAVENOUS
  Filled 2018-03-17 (×3): qty 100

## 2018-03-17 MED ORDER — ONDANSETRON HCL 4 MG/2ML IJ SOLN
4.0000 mg | Freq: Four times a day (QID) | INTRAMUSCULAR | Status: DC | PRN
  Administered 2018-03-17: 4 mg via INTRAVENOUS

## 2018-03-17 MED ORDER — FUROSEMIDE 10 MG/ML IJ SOLN
20.0000 mg | Freq: Three times a day (TID) | INTRAMUSCULAR | Status: AC
  Administered 2018-03-17 (×2): 20 mg via INTRAVENOUS
  Filled 2018-03-17 (×2): qty 2

## 2018-03-17 MED ORDER — PERFLUTREN LIPID MICROSPHERE
1.0000 mL | INTRAVENOUS | Status: AC | PRN
  Administered 2018-03-17: 3 mL via INTRAVENOUS
  Filled 2018-03-17: qty 10

## 2018-03-17 MED ORDER — BISACODYL 10 MG RE SUPP: 10.0000 mg | Freq: Once | RECTAL | Status: DC

## 2018-03-17 MED ORDER — POTASSIUM CHLORIDE CRYS ER 20 MEQ PO TBCR: 20.0000 meq | EXTENDED_RELEASE_TABLET | ORAL | Status: DC

## 2018-03-17 MED ORDER — FUROSEMIDE 10 MG/ML IJ SOLN: 40.0000 mg | Freq: Once | INTRAMUSCULAR | Status: DC

## 2018-03-17 MED ORDER — POTASSIUM CHLORIDE CRYS ER 20 MEQ PO TBCR
40.0000 meq | EXTENDED_RELEASE_TABLET | Freq: Three times a day (TID) | ORAL | Status: DC
  Administered 2018-03-17: 40 meq via ORAL
  Filled 2018-03-17: qty 2

## 2018-03-17 MED ORDER — METHYLPREDNISOLONE SODIUM SUCC 40 MG IJ SOLR: 40.0000 mg | Freq: Two times a day (BID) | INTRAMUSCULAR | Status: DC

## 2018-03-17 MED ORDER — METHYLPREDNISOLONE SODIUM SUCC 40 MG IJ SOLR
40.0000 mg | Freq: Every day | INTRAMUSCULAR | Status: DC
  Administered 2018-03-17 – 2018-03-18 (×2): 40 mg via INTRAVENOUS
  Filled 2018-03-17 (×3): qty 1

## 2018-03-17 NOTE — Evaluation (Signed)
Speech Language Pathology Evaluation Patient Details Name: April Branch MRN: [id number] DOB: 07/27/1945 Today's Date: 03/17/2018 Time: 8416-6063 SLP Time Calculation (min) (ACUTE ONLY): 36 min  Problem List:  Patient Active Problem List   Diagnosis Date Noted  . Acute pulmonary edema (HCC)   . Goals of care, counseling/discussion   . Wheezy bronchitis 03/16/2018  . Left-sided weakness 03/16/2018  . TIA (transient ischemic attack) 03/16/2018  . Acute respiratory failure ([place]) 03/16/2018  . Respiratory failure with hypoxia ([place]) 03/16/2018  . Dizziness 04/23/2017  . Hyperglycemia 10/31/2016  . Closed fracture of transverse process of lumbar vertebra ([place]) 10/31/2016  . Hematoma 10/31/2016  . Recurrent falls 10/31/2016  . Syncope 10/31/2016  . Gait abnormality 10/19/2016  . Diabetic peripheral neuropathy ([place]) 10/19/2016  . Symptomatic bradycardia 10/13/2016  . Elevated troponin   . Bradycardia 10/12/2016  . General weakness 10/12/2016  . CKD (chronic kidney disease), stage III ([place]) 10/12/2016  . Weakness   . Orthostatic syncope 05/10/2016  . Colitis 05/10/2016  . Anemia 05/10/2016  . Hypomagnesemia 05/10/2016  . Food impaction of esophagus 09/02/2015  . Diabetic foot ulcer ([place]) 06/24/2015  . Atheroscler of bypass graft of left leg with intermittent claudication ([place]) 04/17/2014  . Carotid stenosis 05/19/2013  . Chest pain 09/21/2012  . Acute kidney injury superimposed on CKD ([place]) 09/21/2012  . Chronic pain syndrome 03/13/2012  . Acute kidney injury ([place]) 03/13/2012  . Atherosclerosis of native artery of extremity with intermittent claudication ([place]) 10/08/2011  . PVD (peripheral vascular disease) ([place]) 07/30/2011  . CAD (coronary artery disease) 05/10/2011  . Hyperlipidemia   . Unstable angina ([place]) 05/09/2011  . CERVICAL CANCER 12/14/2008  . Personal history of malignant neoplasm of kidney(V10.52) 12/14/2008  . COPD (chronic obstructive pulmonary disease)  ([place]) 12/14/2008  . IBS 12/14/2008  . Essential hypertension   . Personal history of colonic polyps 08/13/2008  . DISORDER, TOBACCO USE 08/04/2006  . Hypothyroidism   . Insulin dependent type 2 diabetes mellitus, controlled ([place])   . Lumbar disc disease   . ANXIETY STATE NOS 07/20/2006  . GERD 07/20/2006  . HX, PERSONAL, VENOUS THROMBOSIS/EMBOLISM 07/20/2006   Past Medical History:  Past Medical History:  Diagnosis Date  . Anemia   . Anxiety   . Arthritis   . CAD (coronary artery disease) 2016   non-obstructive at cath  . Colon polyps 09/11/2010   Tubular adenoma  . COPD 12/14/2008   PATIENT DENIES    . Depression   . Diabetic coma ([place]) Feb. 2014  . Fall at home 2016   x 2 in January 2016, Left knee  . Fibromyalgia   . GERD 07/20/2006  . Headache(784.0)   . History of transient ischemic attack (TIA)   . HPV (human papilloma virus) infection   . Hyperlipidemia   . Hypertension   . Hypothyroidism   . Insulin dependent type 2 diabetes mellitus, controlled ([place]) 1989  . Lumbar disc disease   . Neuromuscular disorder ([place])    PERIPHERAL NEUROPATHY  . Peripheral vascular disease ([place])   . Personal history of malignant neoplasm of kidney(V10.52) 12/14/2008   Laparoscopic biopsy and cryoablation 7/08 Dr. Vernie Shanks   . Renal disorder    chronic kidney disease   . Stroke [facility])    3 MINI STROKES  RT SIDED WEAKNESS  . Vertigo    Past Surgical History:  Past Surgical History:  Procedure Laterality Date  . ABDOMINAL AORTAGRAM N/A 08/21/2011   Procedure: ABDOMINAL Maxcine Ham;  Surgeon: Elam Dutch,  MD;  Location: [place] CATH LAB;  Service: Cardiovascular;  Laterality: N/A;  . ABDOMINAL AORTAGRAM N/A 03/16/2014   Procedure: ABDOMINAL Maxcine Ham;  Surgeon: Elam Dutch, MD;  Location: [facility] CATH LAB;  Service: Cardiovascular;  Laterality: N/A;  . ABDOMINAL HYSTERECTOMY    . APPENDECTOMY    . BLADDER SUSPENSION    . CARDIAC CATHETERIZATION    . CHOLECYSTECTOMY OPEN    .  ESOPHAGOGASTRODUODENOSCOPY (EGD) WITH PROPOFOL N/A 05/16/2014   Procedure: ESOPHAGOGASTRODUODENOSCOPY (EGD) WITH PROPOFOL with Balloon dilation;  Surgeon: Milus Banister, MD;  Location: [place];  Service: Endoscopy;  Laterality: N/A;  . ESOPHAGOGASTRODUODENOSCOPY (EGD) WITH PROPOFOL N/A 09/02/2015   Procedure: ESOPHAGOGASTRODUODENOSCOPY (EGD) WITH PROPOFOL;  Surgeon: Doran Stabler, MD;  Location: [place];  Service: Gastroenterology;  Laterality: N/A;  . FEMORAL-POPLITEAL BYPASS GRAFT  10/12/2011   Procedure: BYPASS GRAFT FEMORAL-POPLITEAL ARTERY;  Surgeon: Elam Dutch, MD;  Location[facility];  Service: Vascular;  Laterality: Left;  Left Femoral-Popliteal Bypass Graft using 73mm x 80cm Propaten Graft with intraop arteriogram times one.  . FEMORAL-POPLITEAL BYPASS GRAFT Left 04/17/2014   Procedure: REDO LEFT FEMORAL-POPLITEAL ARTERY BYPASS USING GORE PROPATEN 33mmx80cm GRAFT;  Surgeon: Elam Dutch, MD;  Location: [place];  Service: Vascular;  Laterality: Left;  . FOOT SURGERY Left    "bone spurs"  . INTRAOPERATIVE ARTERIOGRAM  10/12/2011   Procedure: INTRA OPERATIVE ARTERIOGRAM;  Surgeon: Elam Dutch, MD;  Location: [place];  Service: Vascular;  Laterality: Left;  . KNEE ARTHROSCOPY Bilateral    "cartilage"  . Laparascopic cryoablation of left kidney  08/2006   Dr. Gladis Riffle for renal cell cancer  . LEFT HEART CATHETERIZATION WITH CORONARY ANGIOGRAM N/A 05/11/2011   Procedure: LEFT HEART CATHETERIZATION WITH CORONARY ANGIOGRAM;  Surgeon: Jolaine Artist, MD;  Location: [facility] CATH LAB;  Service: Cardiovascular;  Laterality: N/A;  . LUNG SURGERY Right    lung nodule removed from the right side  . PR VEIN BYPASS GRAFT,AORTO-FEM-POP  05/27/2010  . SHOULDER ARTHROSCOPY Bilateral    'Spurs"  . TUBAL LIGATION     HPI:  Pt is a 73 y.o. female with medical history significant of type 2 diabetes, hypothyroidism, hypertension, chronic kidney disease stage III, peripheral vascular disease and  coronary artery disease. She is an active smoker with chronic pain problems, and chronic left hemiparesis s/p CVA. She presented to the emergency room with vague complaints including total weakness of the left side of the body happened 2 days ago, cough and shortness of breath. MRI of 03/17/18 was negative for acute infarct. She became progressively dyspneic and hypoxic, chest x-ray consistent with pulmonary edema, PCCM was consulted and patient was admitted to ICU on BiPAP with improvement. SLP was consulted for speech/language evaluation, and due to the pt failing the Yale swallow screen.    Assessment / Plan / Recommendation Clinical Impression   The Montreal Cognitive Assessment was completed to evaluate the pt's cognitive-linguistic skills. The pt denied any baseline or new deficits in cognition. She achieved a score of 21/30 which is slightly below the normal limits of 26 or more out of 30 and is suggestive of a mild cognititive impairment. With consideration of her home environment and informal assessment, her cognition appears to be within functional limits and no language deficits were noted. Skilled SLP services are not clinically indicated to target cognition but SLP will continue to follow pt to ensure tolerance of the recommended diet.     SLP Assessment  SLP Visit Diagnosis:  Dysphagia, unspecified (R13.10)    Follow Up Recommendations  None    Frequency and Duration min 2x/week  2 weeks      SLP Evaluation Cognition  Overall Cognitive Status: Within Functional Limits for tasks assessed Arousal/Alertness: Awake/alert Orientation Level: Oriented X4 Attention: Focused Focused Attention: Appears intact Memory: Impaired Memory Impairment: Retrieval deficit Awareness: Appears intact Problem Solving: Appears intact Executive Function: Reasoning Reasoning: Impaired Reasoning Impairment: Verbal complex Safety/Judgment: Appears intact       Comprehension  Auditory  Comprehension Overall Auditory Comprehension: Appears within functional limits for tasks assessed Conversation: Complex    Expression Expression Primary Mode of Expression: Verbal Verbal Expression Overall Verbal Expression: Appears within functional limits for tasks assessed Initiation: No impairment Written Expression Dominant Hand: Right   Oral / Motor  Oral Motor/Sensory Function Overall Oral Motor/Sensory Function: Within functional limits Motor Speech Overall Motor Speech: Appears within functional limits for tasks assessed Phonation: Normal Intelligibility: Intelligible Motor Planning: Witnin functional limits Motor Speech Errors: Not applicable    Mindee Robledo I. Hardin Negus, [place], [place] Office number [phone] Pager [place] 03/17/2018, 4:28 PM

## 2018-03-17 NOTE — Progress Notes (Addendum)
Inpatient Diabetes Program Recommendations  AACE/ADA: New Consensus Statement on Inpatient Glycemic Control  Target Ranges:  Prepandial:   less than 140 mg/dL      Peak postprandial:   less than 180 mg/dL (1-2 hours)      Critically ill patients:  140 - 180 mg/dL   Results for April Branch, April Branch (MRN [id number]) as of 03/17/2018 11:32  Ref. Range 03/16/2018 07:43 03/16/2018 20:34 03/16/2018 23:29 03/17/2018 03:45 03/17/2018 07:44  Glucose-Capillary Latest Ref Range: 70 - 99 mg/dL 85 244 (H) 255 (H) 159 (H) 145 (H)   Results for April, Branch (MRN [id number]) as of 03/17/2018 11:32  Ref. Range 03/17/2018 05:07  Hemoglobin A1C Latest Ref Range: 4.8 - 5.6 % 7.5 (H)   Review of Glycemic Control  Diabetes history: DM2 Outpatient Diabetes medications: OmniPod insulin pump with Humalog Current orders for Inpatient glycemic control: Levemir 15 units BID, Novolog 0-15 units Q4H; Solumedrol 40 mg daily  Inpatient Diabetes Program Recommendations:  HgbA1C: A1C 7.5% on 03/17/18 indicating an average glucose of 169 mg/dl over the past 2-3 months.  NOTE: Noted consult for Diabetes Coordinator. Chart reviewed. Agree with current insulin orders. Once patient starts to eat she may need to add Novolog meal coverage if patient is eating at least 50% of meals.  Noted patient sees Dr. [name] (last seen on 02/24/18), has Type 2 DM, and uses an OmniPod insulin pump with Humalog for DM control. Spoke with patient and she confirms that she does not have on her insulin pump at this time. She states it ran out of insulin yesterday so it was removed and SQ insulin injections were started. Patient does not have OmniPod data device here at the hospital so not able to verify insulin pump settings. Patient confirms that she last seen Dr. [name] on 02/24/18 and some changes were made with her insulin pump at that time. Patient reports that her glucose has been trending in the 200's mg/dl recently and she notes that at times she  forgets to get the personal data device that goes with the OmniPod when she goes out so she can't bolus for meal intake until after she gets home.  Patient has not been ordered a diet yet but she states that she will be glad when she can get some water to drink.  Informed patient that she is ordered Solumedrol which is a steroid and will likely contribute to hyperglycemia. Explained that she is receiving Levemir and Novolog SQ at this time and glucose is most recently 159 mg/dl and 145 mg/dl. Patient reports that she was suppose to see Leonia Reader, RN yesterday for insulin pump changes but she was unable to go to the appointment due to being in the hospital.  Patient verbalized understanding of information and notes that she has no questions or concerns at this time related to DM.    Per office note by Dr. [name] on 02/24/18, the following should be settings to patient's insulin pump:  Basal insulin  12A 1.35 units/hour 8A  2.0 units/hour 6P  1.6 units/hour Total daily basal insulin:  40.4 units/24 hours  Carb Coverage 4 units with lunch 4-6 units with supper  No information noted for insulin sensitivity factor (how much one unit of insulin drops glucose).  Called Dr. Ronnie Derby office to speak with someone to see if patient has carb coverage with breakfast and what her insulin sensitivity is. Left message for Leonia Reader, RN to return call to get all insulin pump settings.  Thanks, Barnie Alderman, RN, MSN, CDE Diabetes Coordinator Inpatient Diabetes Program [phone] (Team Pager from 8am to 5pm)

## 2018-03-17 NOTE — Progress Notes (Addendum)
STROKE TEAM PROGRESS NOTE   INTERVAL HISTORY No family at bedside. Patient in bed receiving a breathing treatment. Awake, alert, able to follow commands. Patient transferred to ICU o/n d/t hypoxia found to have pulmonary edema.  Vitals:   03/17/18 0600 03/17/18 0700 03/17/18 0745 03/17/18 0852  BP: 139/64 (!) 142/103  (!) 139/56  Pulse: 94 93  (!) 103  Resp: (!) 24 (!) 23  (!) 26  Temp:   98.3 F (36.8 C)   TempSrc:   Axillary   SpO2: 100% 91%    Weight:      Height:        CBC:  Recent Labs  Lab 03/16/18 0617  03/16/18 1801 03/17/18 0507  WBC 11.8*  --   --  17.0*  NEUTROABS 8.8*  --   --   --   HGB 10.3*   < > 10.5* 10.1*  HCT 32.4*   < > 31.0* 31.8*  MCV 84.2  --   --  81.7  PLT 359  --   --  345   < > = values in this interval not displayed.    Basic Metabolic Panel:  Recent Labs  Lab 03/16/18 0617  03/16/18 1801 03/16/18 1902 03/17/18 0507  NA 134*   < > 135  --  136  K 4.3   < > 4.9  --  3.5  CL 102  --   --   --  104  CO2 19*  --   --   --  21*  GLUCOSE 105*  --   --   --  164*  BUN 29*  --   --   --  29*  CREATININE 1.97*  --   --   --  1.94*  CALCIUM 9.0  --   --   --  8.6*  MG  --   --   --  1.7  --   PHOS  --   --   --  5.6* 4.1   < > = values in this interval not displayed.   Lipid Panel:     Component Value Date/Time   CHOL 124 03/17/2018 0507   CHOL 219 (H) 04/20/2017 1047   TRIG 66 03/17/2018 0507   HDL 47 03/17/2018 0507   HDL 48 04/20/2017 1047   CHOLHDL 2.6 03/17/2018 0507   VLDL 13 03/17/2018 0507   LDLCALC 64 03/17/2018 0507   LDLCALC Comment 04/20/2017 1047   HgbA1c:  Lab Results  Component Value Date   HGBA1C 7.5 (H) 03/17/2018   Urine Drug Screen:     Component Value Date/Time   LABOPIA POSITIVE (A) 03/16/2018 1920   COCAINSCRNUR NONE DETECTED 03/16/2018 1920   COCAINSCRNUR NEG 09/12/2008 2116   LABBENZ NONE DETECTED 03/16/2018 1920   LABBENZ POS (A) 09/12/2008 2116   AMPHETMU NONE DETECTED 03/16/2018 1920   THCU  NONE DETECTED 03/16/2018 1920   LABBARB NONE DETECTED 03/16/2018 1920    Alcohol Level     Component Value Date/Time   ETH <10 03/16/2018 0737    IMAGING Dg Chest 1 View  Result Date: 03/16/2018 CLINICAL DATA:  Dyspnea with agitation EXAM: CHEST  1 VIEW COMPARISON:  03/16/2018 at 0756 hours FINDINGS: Stable cardiomegaly. Nonaneurysmal atherosclerosis of the aorta. Interval increase in interstitial edema superimposed on coarsened interstitial markings. No pneumothorax or significant pleural effusions given the frontal projection. No acute osseous abnormality. IMPRESSION: Interval increase in interstitial edema superimposed on coarsened interstitial markings. Stable cardiomegaly with aortic atherosclerosis. Electronically  Signed   By: Ashley Royalty M.D.   On: 03/16/2018 18:09   Dg Abdomen 1 View  Result Date: 03/16/2018 CLINICAL DATA:  Poor historian.  Evaluate for metallic foreign body. EXAM: ABDOMEN - 1 VIEW COMPARISON:  None. FINDINGS: No definitive ingested radiopaque foreign body though there is exclusion of the lateral-most aspect the right mid hemiabdomen. Moderate to large colonic stool burden without evidence of enteric obstruction. Nondiagnostic evaluation for pneumoperitoneum secondary to supine positioning and exclusion of the lower thorax. No pneumatosis or portal venous gas. Post cholecystectomy. Punctate phleboliths overlies the right hemipelvis. Degenerative change the lower thoracic and lumbar spine. Atherosclerotic plaque overlies the abdominal aorta and pelvic vasculature. IMPRESSION: 1. No ingested radiopaque foreign body. 2. Moderate to large colonic stool burden without evidence of enteric obstruction. Electronically Signed   By: Sandi Mariscal M.D.   On: 03/16/2018 10:21   Ct Head Wo Contrast  Result Date: 03/16/2018 CLINICAL DATA:  Possible stroke. Ataxia with left-sided arm and leg weakness. EXAM: CT HEAD WITHOUT CONTRAST TECHNIQUE: Contiguous axial images were obtained from the  base of the skull through the vertex without intravenous contrast. COMPARISON:  04/22/2017 FINDINGS: Brain: No evidence of acute infarction, hemorrhage, hydrocephalus, extra-axial collection or mass lesion/mass effect. Mild chronic small vessel ischemic type change in the cerebral white matter. Vascular: Atherosclerotic calcification.  No hyperdense vessel Skull: No acute finding Sinuses/Orbits: Endoscopic sinus surgery on the right. There is volume loss at the right ethmoids with relatively expanded right orbit. Bilateral cataract resection. IMPRESSION: No acute finding. Electronically Signed   By: Monte Fantasia M.D.   On: 03/16/2018 07:16   Mr Brain Wo Contrast  Result Date: 03/16/2018 CLINICAL DATA:  73 year old female with ataxia, left extremity weakness. Possible stroke. EXAM: MRI HEAD WITHOUT CONTRAST TECHNIQUE: Multiplanar, multiecho pulse sequences of the brain and surrounding structures were obtained without intravenous contrast. COMPARISON:  Head CT without contrast 0649 hours today. Brain MRI 04/22/2017 and earlier. FINDINGS: The examination had to be discontinued prior to completion by patient request. Diffusion and motion degraded sagittal T1 weighted imaging only was obtained. No restricted diffusion or evidence of acute infarction. No midline shift, mass effect, or evidence of intracranial mass lesion. No ventriculomegaly. Patchy and confluent facilitated diffusion in the cerebral white matter corresponds to T2 and FLAIR hyperintensity in 2019. Sagittal T1 weighted imaging appears grossly stable and negative. IMPRESSION: 1. Negative for acute infarct. 2. The examination had to be discontinued prior to completion by patient request. Diffusion and motion degraded sagittal T1 weighted imaging only was obtained. Electronically Signed   By: Genevie Ann M.D.   On: 03/16/2018 14:08   Dg Chest Portable 1 View  Result Date: 03/16/2018 CLINICAL DATA:  Shortness of breath and hypoxia EXAM: PORTABLE CHEST 1  VIEW COMPARISON:  12/10/2017 FINDINGS: Cardiopericardial enlargement, increased from prior. Diffuse interstitial coarsening that could be congestive or bronchitic. There is a linear density in the right suprahilar lung that is scarring when compared to priors. No effusion or pneumothorax. IMPRESSION: Increased cardiopericardial size with generalized congestive or bronchitic interstitial coarsening. Electronically Signed   By: Monte Fantasia M.D.   On: 03/16/2018 08:14    PHYSICAL EXAM : Pleasant elderly Caucasian lady currently not in mild respiratory distress.on oxygen mask. . Afebrile. Head is nontraumatic. Neck is supple without bruit.    Cardiac exam no murmur or gallop. Lungs are clear to auscultation. Distal pulses are well felt. Neurological Exam ;  Awake  Alert oriented x 3. Normal speech and  language.eye movements full without nystagmus.fundi were not visualized. Vision acuity and fields appear normal. Hearing is normal. Palatal movements are normal. Face symmetric. Tongue midline. Normal strength, tone, reflexes and coordination. Normal sensation. Gait deferred.   ASSESSMENT/PLAN April Branch is a 73 y.o. female with history of HTN, COPD, HLD, CAD, DM CKD, tobacco abuse, and hypothyroidism presenting with CP, sudden left arm and leg numbness.    TIA:   MRI 1. Negative for acute infarct.but limited due to patient noncooperation      CT Head  No acute finding  Carotid Doppler  pending  2D Echo  pending  Transcranial doppler: Pending  LDL 64  HgbA1c 7.5  SCD's and Heparin for VTE prophylaxis Diet Order            Diet NPO time specified  Diet effective now               No antithrombotic prior to admission, now on No antithrombotic.   Therapy recommendations:  pending  Disposition:  pending  Hypertension  Stable . Permissive hypertension (OK if < 220/120) but gradually normalize in 5-7 days . Long-term BP goal  normotensive  Hyperlipidemia  Home meds:  none,   LDL 64, goal < 70   Diabetes type II  HgbA1c 7.5, goal < 7.0  Uncontrolled  Other Stroke Risk Factors  Advanced age  Cigarette smoker was advised to stop smoking  Obesity, Body mass index is 31.28 kg/m., recommend weight loss, diet and exercise as appropriate   Hx TIA x 3  Coronary artery disease   Other Active Problems   CKD 3  COPD; wheezy bronchitis Stroke team to sign-off at this time. Please call with further questions.   Hospital day # 1  April Morale, MSN, NP-C [facility] [phone] I have personally obtained history,examined this patient, reviewed notes, independently viewed imaging studies, participated in medical decision making and plan of care.ROS completed by me personally and pertinent positives fully documented  I have made any additions or clarifications directly to the above note. Agree with note above. She presented with respiratory distress and chest pain and some subjective left-sided weakness which appears to have resolved. MRI is negative for acute stroke. Continue aspirin and management of respiratory status as per primary medical team. Aggressive risk factor modification. Greater than 50% time during this 35 minute visit was spent on counseling and coordination of care about TIA and stroke. Check carotid ultrasound, echocardiogram and transcranial Doppler studies.  April Contras, MD Medical Director [facility] Pager: [phone] 03/17/2018 4:29 PM   To contact Stroke Continuity provider, please refer to [email or web address] After hours, contact General Neurology

## 2018-03-17 NOTE — Progress Notes (Signed)
PROGRESS NOTE   April Branch  YHC:[id number]    DOB: 1945/07/12    DOA: 03/16/2018  PCP: [name], MD   I have briefly reviewed patients previous medical records in [facility].  Brief Narrative:  73 year old female with PMH of DM 2 on insulin pump, HTN, HLD, hypothyroid, CKD, prior strokes/TIAs with residual left hemiparesis, nonobstructive CAD by cath, COPD, anxiety and depression, GERD, tobacco abuse, chronic pain presented to ED on 03/16/2018 with complaints of left-sided weakness and numbness with inability to walk of 2 days duration, improved by the time she came to ER and cough, wheezing and progressive dyspnea.  In ED code stroke was initiated, CT head without acute findings, became progressively dyspneic and hypoxic, chest x-ray consistent with pulmonary edema, PCCM was consulted and patient was admitted to ICU on BiPAP with improvement.  Neurology consulted.   Assessment & Plan:   Principal Problem:   Left-sided weakness Active Problems:   Hypothyroidism   Insulin dependent type 2 diabetes mellitus, controlled (HCC)   DISORDER, TOBACCO USE   Essential hypertension   COPD (chronic obstructive pulmonary disease) (HCC)   CAD (coronary artery disease)   Acute kidney injury superimposed on CKD (HCC)   CKD (chronic kidney disease), stage III (HCC)   Wheezy bronchitis   TIA (transient ischemic attack)   Acute respiratory failure (HCC)   Respiratory failure with hypoxia (HCC)   Acute on chronic left-sided weakness/? TIA, history of prior CVA/TIAs CT head: No acute findings MRI brain: Negative for acute infarct. Neurology consultation and stroke team follow-up appreciated.  Discussed with stroke MD and suspects TIA. LDL 64 A1c 7.5 TTE: LVEF 60-65% Carotid Dopplers and TCD pending. Currently on rectal aspirin while she is NPO pending swallow eval by ST.  Per RN, failed bedside swallow screen. PT evaluated and recommend home health PT if independent before DC and  has 24/7 supervision and assistance otherwise needs ST SNF. Improved.  Acute hypoxic respiratory failure: Progressive dyspnea and hypoxia noted on night of admission. Suspect due to pulmonary edema. Transferred to ICU and CCM consulted.  Treated with a dose of IV Lasix and BiPAP. Currently off of BiPAP and transitioned to high flow nasal cannula oxygen at 10 L/min this morning. PCCM consultation and follow-up appreciated BiPAP PRN for increased work of breathing Wean oxygen for saturations of 88-92%.  Reassess home oxygen requirement prior to discharge. Continue Brovana/Pulmicort nebulizations, duo nebs every 6 hourly and albuterol as needed. Reduced IV Solu-Medrol to 40 mg daily. RSV panel negative.  Continue empirically started doxycycline. Chest x-ray 1/23 personally reviewed: Improved variation in right lung with persistent bilateral mixed interstitial and airspace opacities and small right pleural effusion.  Will repeat another dose of IV Lasix 40 mg x 1. N.p.o. pending swallow evaluation by speech therapy.  COPD exacerbation No clinical bronchospasm at this time.  Management as noted above. Tobacco cessation counseled. Patient has follow-up appointment with pulmonology on 03/31/2018 at 9 AM.  Tobacco abuse Cessation counseled.  Continue nicotine patch.  Acute diastolic CHF Diuretic management as noted above.  Improving.  Essential hypertension:  Controlled.  Holding oral medications.  Type II DM/insulin pump Follows with Dr. [name], endocrinology. A1c 7.5. Currently not on insulin pump.  Continue Levemir 15 units twice daily and SSI.  Mildly uncontrolled and fluctuating.  Adjust insulins as needed and after diet resumption.  Resume insulin pump at discharge. Diabetes coordinator follow-up appreciated.  Acute kidney injury on stage III chronic kidney disease Baseline creatinine not  exactly known.  Creatinine in November and December of last year with in the 1.2-1.3  range. Presented with creatinine of 1.97.  Stable today.  Follow BMP in a.m.  Hypothyroid Continue IV Synthroid until diet resumed.  Low a.m. cortisol ?  Adrenal insufficiency.  However remains normotensive.  Already on Solu-Medrol for acute respiratory failure.  Chronic normocytic anemia ?  Chronic disease versus chronic kidney disease.  Stable.  Chronic pain/anxiety and depression Currently controlled.  Minimize sedating medications.  Holding Cymbalta, Neurontin, hydroxyzine and nortriptyline.  UDS positive for opiates.  Acute urinary retention Reportedly had in and out catheterization overnight yielding 900+ mL urine.  Monitor for voiding and check bladder scan as needed.  Constipation Noted on abdominal x-ray.  Rectal Dulcolax x1 dose.  Bowel regimen when able to take p.o.   DVT prophylaxis: Subcutaneous heparin Code Status: DNR Family Communication: None at bedside Disposition: Admitted to ICU.  Medically improved and transferred to progressive care unit on 1/23.  Discharge disposition to be determined pending clinical progress.   Consultants:  Neurology PCCM  Procedures:  BiPAP  Antimicrobials:  Doxycycline   Subjective: Interviewed and examined with RN in room.  Patient states her dyspnea feels much improved but breathing not yet at baseline.  No dyspnea.  Unable to tell about left sided weakness because she states that she has not been out of bed.  Asking for something to drink.  ROS: As above, otherwise negative.  Objective:  Vitals:   03/17/18 1100 03/17/18 1200 03/17/18 1300 03/17/18 1400  BP: 111/79 131/62 (!) 121/53 (!) 141/65  Pulse: 93 96 90 92  Resp: 18 (!) 26 18 (!) 21  Temp:  99.5 F (37.5 C)    TempSrc:  Oral    SpO2: 94% 93% 97% 93%  Weight:      Height:        Examination:  General exam: Pleasant elderly female, moderately built and nourished lying comfortably propped up in bed. Respiratory system: Diminished breath sounds in the  bases with few fine basal crackles.  Rest of lung fields clear to auscultation without wheezing or rhonchi.  No increased work of breathing. Cardiovascular system: S1 & S2 heard, RRR. No JVD, murmurs, rubs, gallops or clicks. No pedal edema.  Telemetry personally reviewed: Sinus rhythm. Gastrointestinal system: Abdomen is nondistended, soft and nontender. No organomegaly or masses felt. Normal bowel sounds heard. Central nervous system: Alert and oriented. No focal neurological deficits. Extremities:?  Left side grade 4+ by 5 power.  Right side grade 5 x 5 per.  No pronator drift. Skin: No rashes, lesions or ulcers Psychiatry: Judgement and insight appear somewhat impaired. Mood & affect appropriate.     Data Reviewed: I have personally reviewed following labs and imaging studies  CBC: Recent Labs  Lab 03/16/18 0617 03/16/18 0753 03/16/18 1801 03/17/18 0507  WBC 11.8*  --   --  17.0*  NEUTROABS 8.8*  --   --   --   HGB 10.3* 10.9* 10.5* 10.1*  HCT 32.4* 32.0* 31.0* 31.8*  MCV 84.2  --   --  81.7  PLT 359  --   --  297   Basic Metabolic Panel: Recent Labs  Lab 03/16/18 0617 03/16/18 0753 03/16/18 1801 03/16/18 1902 03/17/18 0507  NA 134* 136 135  --  136  K 4.3 4.3 4.9  --  3.5  CL 102  --   --   --  104  CO2 19*  --   --   --  21*  GLUCOSE 105*  --   --   --  164*  BUN 29*  --   --   --  29*  CREATININE 1.97*  --   --   --  1.94*  CALCIUM 9.0  --   --   --  8.6*  MG  --   --   --  1.7  --   PHOS  --   --   --  5.6* 4.1   Liver Function Tests: Recent Labs  Lab 03/16/18 0617 03/17/18 0507  AST 15  --   ALT 14  --   ALKPHOS 125  --   BILITOT 0.6  --   PROT 7.6  --   ALBUMIN 3.4* 2.9*   Coagulation Profile: Recent Labs  Lab 03/16/18 0617  INR 1.04   HbA1C: Recent Labs    03/17/18 0507  HGBA1C 7.5*   CBG: Recent Labs  Lab 03/16/18 2034 03/16/18 2329 03/17/18 0345 03/17/18 0744 03/17/18 1207  GLUCAP 244* 255* 159* 145* 195*    Recent Results  (from the past 240 hour(s))  Respiratory Panel by PCR     Status: None   Collection Time: 03/16/18  8:31 PM  Result Value Ref Range Status   Adenovirus NOT DETECTED NOT DETECTED Final   Coronavirus 229E NOT DETECTED NOT DETECTED Final   Coronavirus HKU1 NOT DETECTED NOT DETECTED Final   Coronavirus NL63 NOT DETECTED NOT DETECTED Final   Coronavirus OC43 NOT DETECTED NOT DETECTED Final   Metapneumovirus NOT DETECTED NOT DETECTED Final   Rhinovirus / Enterovirus NOT DETECTED NOT DETECTED Final   Influenza A NOT DETECTED NOT DETECTED Final   Influenza B NOT DETECTED NOT DETECTED Final   Parainfluenza Virus 1 NOT DETECTED NOT DETECTED Final   Parainfluenza Virus 2 NOT DETECTED NOT DETECTED Final   Parainfluenza Virus 3 NOT DETECTED NOT DETECTED Final   Parainfluenza Virus 4 NOT DETECTED NOT DETECTED Final   Respiratory Syncytial Virus NOT DETECTED NOT DETECTED Final   Bordetella pertussis NOT DETECTED NOT DETECTED Final   Chlamydophila pneumoniae NOT DETECTED NOT DETECTED Final   Mycoplasma pneumoniae NOT DETECTED NOT DETECTED Final    Comment: Performed at [facility], [address], [place], [place] [address]  MRSA PCR Screening     Status: None   Collection Time: 03/16/18  8:31 PM  Result Value Ref Range Status   MRSA by PCR NEGATIVE NEGATIVE Final    Comment:        The GeneXpert MRSA Assay (FDA approved for NASAL specimens only), is one component of a comprehensive MRSA colonization surveillance program. It is not intended to diagnose MRSA infection nor to guide or monitor treatment for MRSA infections. Performed at [facility], [place] [address], [place], [place] [address]   Culture, blood (Routine X 2) w Reflex to ID Panel     Status: None (Preliminary result)   Collection Time: 03/16/18  9:00 PM  Result Value Ref Range Status   Specimen Description BLOOD RIGHT ANTECUBITAL  Final   Special Requests   Final    BOTTLES DRAWN AEROBIC ONLY Blood Culture results  may not be optimal due to an inadequate volume of blood received in culture bottles   Culture   Final    NO GROWTH < 12 HOURS Performed at [facility] [address] [place], [place] [address]    Report Status PENDING  Incomplete  Culture, blood (Routine X 2) w Reflex  to ID Panel     Status: None (Preliminary result)   Collection Time: 03/16/18  9:09 PM  Result Value Ref Range Status   Specimen Description BLOOD RIGHT ANTECUBITAL  Final   Special Requests   Final    BOTTLES DRAWN AEROBIC ONLY Blood Culture results may not be optimal due to an inadequate volume of blood received in culture bottles   Culture   Final    NO GROWTH < 12 HOURS Performed at [facility] [address] [place], [place] [address]    Report Status PENDING  Incomplete         Radiology Studies: Dg Chest 1 View  Result Date: 03/16/2018 CLINICAL DATA:  Dyspnea with agitation EXAM: CHEST  1 VIEW COMPARISON:  03/16/2018 at 0756 hours FINDINGS: Stable cardiomegaly. Nonaneurysmal atherosclerosis of the aorta. Interval increase in interstitial edema superimposed on coarsened interstitial markings. No pneumothorax or significant pleural effusions given the frontal projection. No acute osseous abnormality. IMPRESSION: Interval increase in interstitial edema superimposed on coarsened interstitial markings. Stable cardiomegaly with aortic atherosclerosis. Electronically Signed   By: Ashley Royalty M.D.   On: 03/16/2018 18:09   Dg Abdomen 1 View  Result Date: 03/16/2018 CLINICAL DATA:  Poor historian.  Evaluate for metallic foreign body. EXAM: ABDOMEN - 1 VIEW COMPARISON:  None. FINDINGS: No definitive ingested radiopaque foreign body though there is exclusion of the lateral-most aspect the right mid hemiabdomen. Moderate to large colonic stool burden without evidence of enteric obstruction. Nondiagnostic evaluation for pneumoperitoneum secondary to supine positioning and exclusion of the lower thorax. No  pneumatosis or portal venous gas. Post cholecystectomy. Punctate phleboliths overlies the right hemipelvis. Degenerative change the lower thoracic and lumbar spine. Atherosclerotic plaque overlies the abdominal aorta and pelvic vasculature. IMPRESSION: 1. No ingested radiopaque foreign body. 2. Moderate to large colonic stool burden without evidence of enteric obstruction. Electronically Signed   By: Sandi Mariscal M.D.   On: 03/16/2018 10:21   Ct Head Wo Contrast  Result Date: 03/16/2018 CLINICAL DATA:  Possible stroke. Ataxia with left-sided arm and leg weakness. EXAM: CT HEAD WITHOUT CONTRAST TECHNIQUE: Contiguous axial images were obtained from the base of the skull through the vertex without intravenous contrast. COMPARISON:  04/22/2017 FINDINGS: Brain: No evidence of acute infarction, hemorrhage, hydrocephalus, extra-axial collection or mass lesion/mass effect. Mild chronic small vessel ischemic type change in the cerebral white matter. Vascular: Atherosclerotic calcification.  No hyperdense vessel Skull: No acute finding Sinuses/Orbits: Endoscopic sinus surgery on the right. There is volume loss at the right ethmoids with relatively expanded right orbit. Bilateral cataract resection. IMPRESSION: No acute finding. Electronically Signed   By: Monte Fantasia M.D.   On: 03/16/2018 07:16   Mr Brain Wo Contrast  Result Date: 03/16/2018 CLINICAL DATA:  73 year old female with ataxia, left extremity weakness. Possible stroke. EXAM: MRI HEAD WITHOUT CONTRAST TECHNIQUE: Multiplanar, multiecho pulse sequences of the brain and surrounding structures were obtained without intravenous contrast. COMPARISON:  Head CT without contrast 0649 hours today. Brain MRI 04/22/2017 and earlier. FINDINGS: The examination had to be discontinued prior to completion by patient request. Diffusion and motion degraded sagittal T1 weighted imaging only was obtained. No restricted diffusion or evidence of acute infarction. No midline  shift, mass effect, or evidence of intracranial mass lesion. No ventriculomegaly. Patchy and confluent facilitated diffusion in the cerebral white matter corresponds to T2 and FLAIR hyperintensity in 2019. Sagittal T1 weighted imaging appears grossly stable and negative. IMPRESSION: 1. Negative for acute infarct. 2.  The examination had to be discontinued prior to completion by patient request. Diffusion and motion degraded sagittal T1 weighted imaging only was obtained. Electronically Signed   By: Genevie Ann M.D.   On: 03/16/2018 14:08   Dg Chest Port 1 View  Result Date: 03/17/2018 CLINICAL DATA:  73 year old female with a history of acute congestive heart failure EXAM: PORTABLE CHEST 1 VIEW COMPARISON:  03/16/2018, 03/16/2018, 12/10/2017 FINDINGS: Cardiomediastinal silhouette unchanged with cardiomegaly. Similar appearance of mixed interstitial and airspace opacities of the bilateral lungs with improved aeration on the right. Superimposed interlobular septal thickening. No pneumothorax. Blunting of the right costophrenic angle. IMPRESSION: Improved aeration of the right lung, with persisting bilateral mixed interstitial and airspace opacity. Small right pleural effusion. Electronically Signed   By: Corrie Mckusick D.O.   On: 03/17/2018 09:24   Dg Chest Portable 1 View  Result Date: 03/16/2018 CLINICAL DATA:  Shortness of breath and hypoxia EXAM: PORTABLE CHEST 1 VIEW COMPARISON:  12/10/2017 FINDINGS: Cardiopericardial enlargement, increased from prior. Diffuse interstitial coarsening that could be congestive or bronchitic. There is a linear density in the right suprahilar lung that is scarring when compared to priors. No effusion or pneumothorax. IMPRESSION: Increased cardiopericardial size with generalized congestive or bronchitic interstitial coarsening. Electronically Signed   By: Monte Fantasia M.D.   On: 03/16/2018 08:14        Scheduled Meds: .  stroke: mapping our early stages of recovery book    Does not apply Once  . arformoterol  15 mcg Nebulization BID  . aspirin  300 mg Rectal Daily  . budesonide (PULMICORT) nebulizer solution  0.5 mg Nebulization BID  . chlorhexidine  15 mL Mouth Rinse BID  . heparin injection (subcutaneous)  5,000 Units Subcutaneous Q8H  . insulin aspart  0-15 Units Subcutaneous Q4H  . insulin detemir  15 Units Subcutaneous BID  . ipratropium-albuterol  3 mL Nebulization Q6H  . levothyroxine  75 mcg Intravenous Daily  . mouth rinse  15 mL Mouth Rinse q12n4p  . methylPREDNISolone (SOLU-MEDROL) injection  40 mg Intravenous Daily  . nicotine  14 mg Transdermal Daily  . pantoprazole (PROTONIX) IV  40 mg Intravenous Q24H   Continuous Infusions: . doxycycline (VIBRAMYCIN) IV 125 mL/hr at 03/17/18 0400     LOS: 1 day     Vernell Leep, MD, [place], [facility]. [name]  To contact the attending provider between 7A-7P or the covering provider during after hours 7P-7A, please log into the web site [email or web address] and access using universal [place] password for that web site. If you do not have the password, please call the hospital operator.  03/17/2018, 2:14 PM

## 2018-03-17 NOTE — Progress Notes (Signed)
Patient placed on HFNC at this time, taken off bipap due to nausea.

## 2018-03-17 NOTE — Progress Notes (Signed)
  Echocardiogram 2D Echocardiogram has been performed with Definity.  April Branch 03/17/2018, 10:23 AM

## 2018-03-17 NOTE — Progress Notes (Signed)
[facility] Progress Note Patient Name: April Branch DOB: 10-11-1945 MRN: [id number]   Date of Service  03/17/2018  HPI/Events of Note  K+ 3.5  eICU Interventions  KCL 20 meq Q 4 hours x 2 doses        [place]wo U Ogan 03/17/2018, 7:03 AM

## 2018-03-17 NOTE — Progress Notes (Addendum)
NAME:  VERNESSA LIKES, MRN:  [id number], DOB:  [birth year], LOS: 1 ADMISSION DATE:  03/16/2018, CONSULTATION DATE:  03/16/18 REFERRING MD:  Sloan Leiter -THN, CHIEF COMPLAINT:  Shortness of breath  Brief History   73 yo F presenting initially as code stroke with L sided numbness who has exhibited progressive shortness of breath and has had increasing oxygen requirements. CXR c/w pulm edema s/p lasix.  Patient is DNI/ DNR.  Placed on BiPAP with improvement.  Past Medical History  COPD, Tobacco use disorder, CAD, PVD, HTN, TIA, HLD, DMT2 on insulin pump, depression, hypothyroidism, CKD, GERD, fibromyalgia, anxiety, arthritis, headache, vertigo   Significant Hospital Events   1/22> CT head negative for acute abnormality 1/22> admitted for dyspnea with hypoxia  Consults:  PCCM Neurology  Procedures:   Significant Diagnostic Tests:  1/22 Ct Head> negative for acute ischemic or hemorrhagic stroke  1/22 CXR> cardiomegaly, athersclerotic changes of aorta, pulmonary edema pattern   1/22 MR brain >> Negative for acute infarct.  The examination had to be discontinued prior to completion by patient request. Diffusion and motion degraded sagittal T1 weighted imaging only was obtained.  Micro Data:  1/22 RVP>> neg 1/22 Sputum cx>>  1/22 BCx 2 >> 1/22 MRSA PCR >>  Antimicrobials:  Doxycycline 1/22>>   Interim history/subjective:  Taken off BiPAP overnight due to nausea, tolerating HFNC  Denies SOB, complains of baseline chronic pain all over.  Denies further nausea.  Asking for water. - 750 ml/ 24 hr, good UOP, stable sCr  Objective   Blood pressure (!) 139/56, pulse (!) 103, temperature 98.3 F (36.8 C), temperature source Axillary, resp. rate (!) 26, height 5\' 1"  (1.549 m), weight 75.1 kg, SpO2 91 %.    FiO2 (%):  [50 %-92 %] 92 %   Intake/Output Summary (Last 24 hours) at 03/17/2018 0855 Last data filed at 03/17/2018 0400 Gross per 24 hour  Intake 250 ml  Output 1001 ml  Net -751  ml   Filed Weights   03/16/18 0615 03/16/18 2025  Weight: 71.7 kg 75.1 kg    Examination: General:  Older female sitting upright in bed in NAD, looks much better HEENT: MM pink/moist, no obvious JVD Neuro: Awake, oriented x 3, MAE, minimal weakness on left if any  CV: SR, no mumur, +2 pulses PULM: even/non-labored- speaking full sentences without dyspnea, mild ongoing tachypnea, diffuse rales throughout, no wheeze, NO platypnea today GI: obese, soft, non-tender, bs active  Extremities: warm/dry, no LE edema  Skin: no rashes   Resolved Hospital Problem list     Assessment & Plan:   Acute hypoxic respiratory failure At risk for aspiration pneumonia  COPD (presumed, no formal workup) Tobacco abuse DNI/ DNR - vague symptoms of 2-3 days of non productive cough and wheezing - CXR c/w with pulmonary edema P:  Continue BIPAP as needed for increased WOB Wean supplemental O2 for sat goal 88-92%, if unable to wean, will need to assess for home O2 prior to d/c Continue brovonna/ pulmicort nebs duonebs q 6, albuterol prn  Decrease solumedrol 40 q day RVP negative Continue emperic doxycycline  SLP eval pending  Pending repeat CXR, consider further diuresis  Pending TTE  Ongoing smoking cessation counseling Patient is agreeable to follow-up with [place] in pulmonary office for formal workup with COPD and management of chronic respiratory issues.  Appointment made for 03/31/2018 at 9 am with Geraldo Pitter, NP.    Acute pulmonary edema HTN P:  Tele monitoring TTE pending, can d/c bubble  study, no platypnea today, remains SR Likely to benefit from additional diuresis today  strict I/O, daily wts Trend BMP Hold PO meds- norvasc, cilostazol   Acute on chronic left sided weakness, hx prior stroke P:  Neurology following- ongoing stroke workup MRA did not show acute ischemic stroke- limited study Ongoing neuro exams HgA1c - 7.5 ASA rectal for now  DM on insulin pump at  home P:  Continue SSI mod levemir 15 BID  (basal dose 70 units via insulin pump) Consult diabetes counselor    AKI on CKD stage III P:  Monitor  I/O, UOP, daily weights  Trend renal panel  Renal dose meds   R/o dysphagia -failed bedside swallow x 2 in ER P:  Pending formal SLP NPO  Hypothyroidism ? Adrenal insufficiency  -TSH 0.47 P:  Change synthroid to IV  Hold florinef, may need to start solu cortef if becomes hemodynamically instable (BP remains normotensive)  Chronic normocytic anemia P:  hgb stable Trend CBC Hold oral iron  Chronic pain/ anxiety P:  Minimize sedating meds as able  Dilaudid 0.5 mg prn 4 hr Holding cymbalta  Neurontin, hydroxyzine, nortriptyline UDS - positive for opiates   Best practice:  Diet: NPO, pending SLP  Pain/Anxiety/Delirium protocol (if indicated): Dilaudid PRN  VAP protocol (if indicated): n/a.  DVT prophylaxis: SCD GI prophylaxis: protonix Glucose control: levemir BID, SSI  Mobility: bedrest  Code Status: DNR/DNI Family Communication: Marcie Mowers [phone]), at bedside 1/22.   Disposition: Hemodynamically stable with marked improvement in respiratory status.  Will transfer to progressive and [facility] for primary service 1/24.  PCCM will sign off.  Please do not hesitate to call us back if we can be of any further assistance.  Labs   CBC: Recent Labs  Lab 03/16/18 0617 03/16/18 0753 03/16/18 1801 03/17/18 0507  WBC 11.8*  --   --  17.0*  NEUTROABS 8.8*  --   --   --   HGB 10.3* 10.9* 10.5* 10.1*  HCT 32.4* 32.0* 31.0* 31.8*  MCV 84.2  --   --  81.7  PLT 359  --   --  423    Basic Metabolic Panel: Recent Labs  Lab 03/16/18 0617 03/16/18 0753 03/16/18 1801 03/16/18 1902 03/17/18 0507  NA 134* 136 135  --  136  K 4.3 4.3 4.9  --  3.5  CL 102  --   --   --  104  CO2 19*  --   --   --  21*  GLUCOSE 105*  --   --   --  164*  BUN 29*  --   --   --  29*  CREATININE 1.97*  --   --   --  1.94*  CALCIUM 9.0  --    --   --  8.6*  MG  --   --   --  1.7  --   PHOS  --   --   --  5.6* 4.1   GFR: Estimated Creatinine Clearance: 25 mL/min (A) (by C-G formula based on SCr of 1.94 mg/dL (H)). Recent Labs  Lab 03/16/18 0617 03/17/18 0507  WBC 11.8* 17.0*    Liver Function Tests: Recent Labs  Lab 03/16/18 0617 03/17/18 0507  AST 15  --   ALT 14  --   ALKPHOS 125  --   BILITOT 0.6  --   PROT 7.6  --   ALBUMIN 3.4* 2.9*   No results for input(s): LIPASE, AMYLASE in the  last 168 hours. Recent Labs  Lab 03/16/18 0737  AMMONIA 13    ABG    Component Value Date/Time   PHART 7.339 (L) 03/16/2018 1801   PCO2ART 31.6 (L) 03/16/2018 1801   PO2ART 47.0 (L) 03/16/2018 1801   HCO3 17.0 (L) 03/16/2018 1801   TCO2 18 (L) 03/16/2018 1801   ACIDBASEDEF 8.0 (H) 03/16/2018 1801   O2SAT 80.0 03/16/2018 1801     Coagulation Profile: Recent Labs  Lab 03/16/18 0617  INR 1.04    Cardiac Enzymes: No results for input(s): CKTOTAL, CKMB, CKMBINDEX, TROPONINI in the last 168 hours.  HbA1C: Hgb A1c MFr Bld  Date/Time Value Ref Range Status  03/17/2018 05:07 AM 7.5 (H) 4.8 - 5.6 % Final    Comment:    (NOTE) Pre diabetes:          5.7%-6.4% Diabetes:              >6.4% Glycemic control for   <7.0% adults with diabetes   01/12/2018 11:00 AM 7.8 (H) 4.6 - 6.5 % Final    Comment:    Glycemic Control Guidelines for People with Diabetes:Non Diabetic:  <6%Goal of Therapy: <7%Additional Action Suggested:  >8%     CBG: Recent Labs  Lab 03/16/18 0743 03/16/18 2034 03/16/18 2329 03/17/18 0345 03/17/18 0744  GLUCAP 85 244* 255* 159* [place], MSN, AGACNP-BC [facility] Pgr: [phone] or if no answer [phone] 03/17/2018, 8:56 AM  Attending Note:  73 year old female with history of CHF who presents to PCCM with pulmonary edema induced acute hypoxemic respiratory failure.  Off BiPAP since this AM and feels much better.  On exam, bibasilar crackles  noted.  I reviewed CXR myself, pulmonary edema noted.  Discussed with PCCM-NP.  Acute respiratory failure:  - D/C BiPAP  Acute pulmonary edema:  - Lasix 20 mg IV q8 x2 doses  Hypokalemia:  - K-dur replacement  Hypoxemia:  - Titrate O2 for sat of 88-92%.  Dysphagia:  - SLP ordered  DM:  - ISS  - CBGs  GOC: DNR status confirmed.  PCCM will sign off, please call back if needed.  Patient seen and examined, agree with above note.  I dictated the care and orders written for this patient under my direction.  Rush Farmer, [place]

## 2018-03-17 NOTE — Evaluation (Signed)
Clinical/Bedside Swallow Evaluation Patient Details  Name: ROSALY LABARBERA MRN: [id number] Date of Birth: 1945/09/02  Today's Date: 03/17/2018 Time: SLP Start Time (ACUTE ONLY): 0160 SLP Stop Time (ACUTE ONLY): 1530 SLP Time Calculation (min) (ACUTE ONLY): 17 min  Past Medical History:  Past Medical History:  Diagnosis Date  . Anemia   . Anxiety   . Arthritis   . CAD (coronary artery disease) 2016   non-obstructive at cath  . Colon polyps 09/11/2010   Tubular adenoma  . COPD 12/14/2008   PATIENT DENIES    . Depression   . Diabetic coma ([place]) Feb. 2014  . Fall at home 2016   x 2 in January 2016, Left knee  . Fibromyalgia   . GERD 07/20/2006  . Headache(784.0)   . History of transient ischemic attack (TIA)   . HPV (human papilloma virus) infection   . Hyperlipidemia   . Hypertension   . Hypothyroidism   . Insulin dependent type 2 diabetes mellitus, controlled ([place]) 1989  . Lumbar disc disease   . Neuromuscular disorder ([place])    PERIPHERAL NEUROPATHY  . Peripheral vascular disease ([place])   . Personal history of malignant neoplasm of kidney(V10.52) 12/14/2008   Laparoscopic biopsy and cryoablation 7/08 Dr. Vernie Shanks   . Renal disorder    chronic kidney disease   . Stroke [facility])    3 MINI STROKES  RT SIDED WEAKNESS  . Vertigo    Past Surgical History:  Past Surgical History:  Procedure Laterality Date  . ABDOMINAL AORTAGRAM N/A 08/21/2011   Procedure: ABDOMINAL Maxcine Ham;  Surgeon: Elam Dutch, MD;  Location: [facility] CATH LAB;  Service: Cardiovascular;  Laterality: N/A;  . ABDOMINAL AORTAGRAM N/A 03/16/2014   Procedure: ABDOMINAL Maxcine Ham;  Surgeon: Elam Dutch, MD;  Location: [facility] CATH LAB;  Service: Cardiovascular;  Laterality: N/A;  . ABDOMINAL HYSTERECTOMY    . APPENDECTOMY    . BLADDER SUSPENSION    . CARDIAC CATHETERIZATION    . CHOLECYSTECTOMY OPEN    . ESOPHAGOGASTRODUODENOSCOPY (EGD) WITH PROPOFOL N/A 05/16/2014   Procedure: ESOPHAGOGASTRODUODENOSCOPY  (EGD) WITH PROPOFOL with Balloon dilation;  Surgeon: Milus Banister, MD;  Location: [place];  Service: Endoscopy;  Laterality: N/A;  . ESOPHAGOGASTRODUODENOSCOPY (EGD) WITH PROPOFOL N/A 09/02/2015   Procedure: ESOPHAGOGASTRODUODENOSCOPY (EGD) WITH PROPOFOL;  Surgeon: Doran Stabler, MD;  Location: [place];  Service: Gastroenterology;  Laterality: N/A;  . FEMORAL-POPLITEAL BYPASS GRAFT  10/12/2011   Procedure: BYPASS GRAFT FEMORAL-POPLITEAL ARTERY;  Surgeon: Elam Dutch, MD;  Location: [facility] OR;  Service: Vascular;  Laterality: Left;  Left Femoral-Popliteal Bypass Graft using 68mm x 80cm Propaten Graft with intraop arteriogram times one.  . FEMORAL-POPLITEAL BYPASS GRAFT Left 04/17/2014   Procedure: REDO LEFT FEMORAL-POPLITEAL ARTERY BYPASS USING GORE PROPATEN 48mmx80cm GRAFT;  Surgeon: Elam Dutch, MD;  Location: [place];  Service: Vascular;  Laterality: Left;  . FOOT SURGERY Left    "bone spurs"  . INTRAOPERATIVE ARTERIOGRAM  10/12/2011   Procedure: INTRA OPERATIVE ARTERIOGRAM;  Surgeon: Elam Dutch, MD;  Location: [place];  Service: Vascular;  Laterality: Left;  . KNEE ARTHROSCOPY Bilateral    "cartilage"  . Laparascopic cryoablation of left kidney  08/2006   Dr. Gladis Riffle for renal cell cancer  . LEFT HEART CATHETERIZATION WITH CORONARY ANGIOGRAM N/A 05/11/2011   Procedure: LEFT HEART CATHETERIZATION WITH CORONARY ANGIOGRAM;  Surgeon: Jolaine Artist, MD;  Location: [facility] CATH LAB;  Service: Cardiovascular;  Laterality: N/A;  . LUNG SURGERY Right  lung nodule removed from the right side  . PR VEIN BYPASS GRAFT,AORTO-FEM-POP  05/27/2010  . SHOULDER ARTHROSCOPY Bilateral    'Spurs"  . TUBAL LIGATION     HPI:  Pt is a 73 y.o. female with medical history significant of type 2 diabetes, hypothyroidism, hypertension, chronic kidney disease stage III, peripheral vascular disease and coronary artery disease. She is an active smoker with chronic pain problems, and chronic left  hemiparesis s/p CVA. She presented to the emergency room with vague complaints including total weakness of the left side of the body happened 2 days ago, cough and shortness of breath. MRI of 03/17/18 was negative for acute infarct. She became progressively dyspneic and hypoxic, chest x-ray consistent with pulmonary edema, PCCM was consulted and patient was admitted to ICU on BiPAP with improvement. SLP was consulted for speech/language evaluation, and due to the pt failing the [facility] swallow screen.    Assessment / Plan / Recommendation Clinical Impression  Pt participated in an oral mechanism exam which was within functional limits. She accepted trials of ice chips, puree solids, regular texture solids, and thin liquids. She exhibited coughing with one large full-teaspoon bolus of ice chips but tolerated all other consistencies and smaller boluses of ice chips without overt s/sx of aspiration. Mastication time was within functional limits an no significant oral residue was noted. Symptoms of pharyngeal residue were not exhibited. Pt's swallow mechanism appears to be within functional limits but she has requested a soft/dysphagia 3 diet since she only uses maxillary dentures and mastication of harder solids is therefore more challenging. SLP will follow to ensure diet tolerance.  SLP Visit Diagnosis: Dysphagia, unspecified (R13.10)    Aspiration Risk  No limitations    Diet Recommendation Dysphagia 3 (Mech soft);Thin liquid   Liquid Administration via: Cup;Straw Medication Administration: Whole meds with puree Supervision: Patient able to self feed Compensations: Slow rate;Small sips/bites Postural Changes: Seated upright at 90 degrees;Remain upright for at least 30 minutes after po intake    Other  Recommendations Oral Care Recommendations: Oral care BID   Follow up Recommendations None      Frequency and Duration min 2x/week  2 weeks       Prognosis Prognosis for Safe Diet Advancement:  Good      Swallow Study   General Date of Onset: 03/16/18 HPI: Pt is a 73 y.o. female with medical history significant of type 2 diabetes, hypothyroidism, hypertension, chronic kidney disease stage III, peripheral vascular disease and coronary artery disease. She is an active smoker with chronic pain problems, and chronic left hemiparesis s/p CVA. She presented to the emergency room with vague complaints including total weakness of the left side of the body happened 2 days ago, cough and shortness of breath. MRI of 03/17/18 was negative for acute infarct. She became progressively dyspneic and hypoxic, chest x-ray consistent with pulmonary edema, PCCM was consulted and patient was admitted to ICU on BiPAP with improvement. SLP was consulted for speech/language evaluation, and due to the pt failing the [facility] swallow screen.  Type of Study: Bedside Swallow Evaluation Previous Swallow Assessment: None Diet Prior to this Study: NPO Temperature Spikes Noted: No Respiratory Status: Nasal cannula History of Recent Intubation: No Behavior/Cognition: Alert;Cooperative;Pleasant mood Oral Cavity Assessment: Within Functional Limits Oral Care Completed by SLP: No Oral Cavity - Dentition: Dentures, top(No mandibular dentition) Vision: Functional for self-feeding Self-Feeding Abilities: Able to feed self Patient Positioning: Upright in bed;Postural control adequate for testing Baseline Vocal Quality: Normal Volitional Swallow: Able  to elicit    Oral/Motor/Sensory Function Overall Oral Motor/Sensory Function: Within functional limits   Ice Chips Ice chips: Within functional limits   Thin Liquid Thin Liquid: Within functional limits Presentation: Cup;Straw    Nectar Thick Nectar Thick Liquid: Not tested   Honey Thick Honey Thick Liquid: Not tested   Puree Puree: Within functional limits Presentation: Spoon   Solid    Taeveon Keesling I. Hardin Negus, [place], [place] Office number  [phone] Pager [phone]  Solid: Within functional limits Presentation: Self Fed      Horton Marshall 03/17/2018,4:20 PM

## 2018-03-17 NOTE — Progress Notes (Signed)
Physical Therapy Treatment Patient Details Name: April Branch MRN: [id number] DOB: November 28, 1945 Today's Date: 03/17/2018    History of Present Illness  April Branch is a 73 y.o. female with medical history significant for type 2 diabetes on insulin pump, hypothyroidism, hypertension, chronic kidney disease stage III, peripheral vascular disease and coronary artery disease, active smoker and chronic pain problems, chronic left hemiparesis with history of a stroke presents to the emergency room with vague complaints including total weakness of the left side of the body happened 2 days ago, CP, cough and shortness of breath.  CT and motion degraded MRI showed no acute event.    PT Comments    Pt admitted with/for CP, L sided weakness and SOB.  Presently, this pt is mobilizing with minimal assist for gait and min guard for basic mobility.  She will need to be independent to go home as her son is gone much of the time.  Pt currently limited functionally due to the problems listed below.  (see problems list.)  Pt will benefit from PT to maximize function and safety to be able to get home safely with available assist.    Follow Up Recommendations  Home health PT;Supervision/Assistance - 24 hour;Other (comment)(pt must be independent before d/c home or needs ST SNF)     Equipment Recommendations  None recommended by PT    Recommendations for Other Services       Precautions / Restrictions Precautions Precautions: Fall    Mobility  Bed Mobility Overal bed mobility: Needs Assistance Bed Mobility: Rolling;Sidelying to Sit Rolling: Min guard Sidelying to sit: Min guard       General bed mobility comments: up via R elbow without assist or rail  Transfers Overall transfer level: Needs assistance   Transfers: Sit to/from Stand Sit to Stand: Min guard            Ambulation/Gait Ambulation/Gait assistance: Min assist Gait Distance (Feet): 60 Feet Assistive device:  Rolling walker (2 wheeled);None Gait Pattern/deviations: Step-through pattern Gait velocity: slower Gait velocity interpretation: <1.31 ft/sec, indicative of household ambulator General Gait Details: generally steady with the RW which she does not usually use, but getting wheels caught in lines.  Mildly unsteady and guarded without AD, but no LOB or overt deviation   Stairs             Wheelchair Mobility    Modified Rankin (Stroke Patients Only)       Balance Overall balance assessment: Needs assistance Sitting-balance support: No upper extremity supported;Single extremity supported Sitting balance-Leahy Scale: Fair(to good) Sitting balance - Comments: put on socks well without assist at EOB   Standing balance support: Single extremity supported;No upper extremity supported Standing balance-Leahy Scale: Fair                              Cognition Arousal/Alertness: Awake/alert Behavior During Therapy: WFL for tasks assessed/performed Overall Cognitive Status: Within Functional Limits for tasks assessed                                        Exercises      General Comments General comments (skin integrity, edema, etc.): sats on HFNC 10L at 92% during ambulation      Pertinent Vitals/Pain Pain Assessment: Faces Faces Pain Scale: No hurt    Home Living Family/patient expects to be  discharged to:: Private residence Living Arrangements: Children(son is gone much of the day and on w/e's per pt.) Available Help at Discharge: Family;Available PRN/intermittently Type of Home: House Home Access: Stairs to enter Entrance Stairs-Rails: Right;Left;Can reach both Home Layout: One level;Laundry or work area in [place]: Kasandra Knudsen - single point;Walker - 4 wheels;Wheelchair - manual;Bedside commode Additional Comments: has 5 dogs    Prior Function Level of Independence: Needs assistance  Gait / Transfers Assistance Needed: uses cane  or nothing in the house ADL's / Homemaking Assistance Needed: does wash dishes and sweeps or vacuums, does not go to basement to do laundry     PT Goals (current goals can now be found in the care plan section) Acute Rehab PT Goals Patient Stated Goal: want to go home PT Goal Formulation: With patient Time For Goal Achievement: 03/31/18 Potential to Achieve Goals: Good    Frequency    Min 3X/week      PT Plan      Co-evaluation              AM-PAC PT "6 Clicks" Mobility   Outcome Measure  Help needed turning from your back to your side while in a flat bed without using bedrails?: None Help needed moving from lying on your back to sitting on the side of a flat bed without using bedrails?: None Help needed moving to and from a bed to a chair (including a wheelchair)?: A Little Help needed standing up from a chair using your arms (e.g., wheelchair or bedside chair)?: A Little Help needed to walk in hospital room?: A Little Help needed climbing 3-5 steps with a railing? : A Little 6 Click Score: 20    End of Session   Activity Tolerance: Patient tolerated treatment well Patient left: in chair;with call bell/phone within reach Nurse Communication: Mobility status PT Visit Diagnosis: Other abnormalities of gait and mobility (R26.89);Difficulty in walking, not elsewhere classified (R26.2);Other symptoms and signs involving the nervous system (R29.898)     Time: 1128-1209 PT Time Calculation (min) (ACUTE ONLY): 41 min  Charges:  $Gait Training: 23-37 mins                     03/17/2018  Donnella Sham, [place] [phone]  (pager) [phone]  (office)   Tessie Fass Weslie Pretlow 03/17/2018, 12:39 PM

## 2018-03-18 LAB — BETA-HYDROXYBUTYRIC ACID: Beta-Hydroxybutyric Acid: 0.07 mmol/L (ref 0.05–0.27)

## 2018-03-18 LAB — GLUCOSE, CAPILLARY
GLUCOSE-CAPILLARY: 178 mg/dL — AB (ref 70–99)
GLUCOSE-CAPILLARY: 92 mg/dL (ref 70–99)
Glucose-Capillary: 138 mg/dL — ABNORMAL HIGH (ref 70–99)
Glucose-Capillary: 294 mg/dL — ABNORMAL HIGH (ref 70–99)
Glucose-Capillary: 329 mg/dL — ABNORMAL HIGH (ref 70–99)
Glucose-Capillary: 354 mg/dL — ABNORMAL HIGH (ref 70–99)
Glucose-Capillary: 397 mg/dL — ABNORMAL HIGH (ref 70–99)
Glucose-Capillary: 434 mg/dL — ABNORMAL HIGH (ref 70–99)

## 2018-03-18 LAB — BASIC METABOLIC PANEL
Anion gap: 9 (ref 5–15)
BUN: 33 mg/dL — ABNORMAL HIGH (ref 8–23)
CO2: 23 mmol/L (ref 22–32)
Calcium: 9.2 mg/dL (ref 8.9–10.3)
Chloride: 106 mmol/L (ref 98–111)
Creatinine, Ser: 1.59 mg/dL — ABNORMAL HIGH (ref 0.44–1.00)
GFR calc Af Amer: 38 mL/min — ABNORMAL LOW (ref >60)
GFR calc non Af Amer: 33 mL/min — ABNORMAL LOW (ref >60)
Glucose, Bld: 82 mg/dL (ref 70–99)
Potassium: 3.6 mmol/L (ref 3.5–5.1)
Sodium: 138 mmol/L (ref 135–145)

## 2018-03-18 LAB — CBC
HCT: 31 % — ABNORMAL LOW (ref 36.0–46.0)
HEMOGLOBIN: 9.9 g/dL — AB (ref 12.0–15.0)
MCH: 26.2 pg (ref 26.0–34.0)
MCHC: 31.9 g/dL (ref 30.0–36.0)
MCV: 82 fL (ref 80.0–100.0)
Platelets: 381 10*3/uL (ref 150–400)
RBC: 3.78 MIL/uL — ABNORMAL LOW (ref 3.87–5.11)
RDW: 14.4 % (ref 11.5–15.5)
WBC: 20.3 10*3/uL — ABNORMAL HIGH (ref 4.0–10.5)
nRBC: 0 % (ref 0.0–0.2)

## 2018-03-18 LAB — PHOSPHORUS: PHOSPHORUS: 3.5 mg/dL (ref 2.5–4.6)

## 2018-03-18 LAB — MAGNESIUM: Magnesium: 1.8 mg/dL (ref 1.7–2.4)

## 2018-03-18 MED ORDER — WHITE PETROLATUM EX OINT
TOPICAL_OINTMENT | CUTANEOUS | Status: AC
  Administered 2018-03-18: 09:00:00
  Filled 2018-03-18: qty 28.35

## 2018-03-18 MED ORDER — FERROUS SULFATE 325 (65 FE) MG PO TABS
325.0000 mg | ORAL_TABLET | Freq: Every day | ORAL | Status: DC
  Administered 2018-03-18 – 2018-03-20 (×3): 325 mg via ORAL
  Filled 2018-03-18 (×3): qty 1

## 2018-03-18 MED ORDER — SENNA 8.6 MG PO TABS
2.0000 | ORAL_TABLET | Freq: Every day | ORAL | Status: DC
  Filled 2018-03-18 (×2): qty 2

## 2018-03-18 MED ORDER — IPRATROPIUM-ALBUTEROL 0.5-2.5 (3) MG/3ML IN SOLN
3.0000 mL | Freq: Three times a day (TID) | RESPIRATORY_TRACT | Status: DC
  Administered 2018-03-18 – 2018-03-20 (×6): 3 mL via RESPIRATORY_TRACT
  Filled 2018-03-18 (×6): qty 3

## 2018-03-18 MED ORDER — POLYETHYLENE GLYCOL 3350 17 G PO PACK
17.0000 g | PACK | Freq: Every day | ORAL | Status: DC
  Filled 2018-03-18 (×2): qty 1

## 2018-03-18 MED ORDER — GABAPENTIN 300 MG PO CAPS
300.0000 mg | ORAL_CAPSULE | Freq: Two times a day (BID) | ORAL | Status: DC
  Administered 2018-03-18 – 2018-03-20 (×5): 300 mg via ORAL
  Filled 2018-03-18 (×5): qty 1

## 2018-03-18 MED ORDER — CILOSTAZOL 50 MG PO TABS
100.0000 mg | ORAL_TABLET | Freq: Two times a day (BID) | ORAL | Status: DC
  Administered 2018-03-18 – 2018-03-20 (×5): 100 mg via ORAL
  Filled 2018-03-18 (×4): qty 2
  Filled 2018-03-18: qty 1

## 2018-03-18 MED ORDER — HYDROMORPHONE HCL 1 MG/ML IJ SOLN
0.5000 mg | Freq: Four times a day (QID) | INTRAMUSCULAR | Status: DC | PRN
  Administered 2018-03-19: 0.5 mg via INTRAVENOUS
  Filled 2018-03-18: qty 0.5

## 2018-03-18 MED ORDER — LEVOTHYROXINE SODIUM 25 MCG PO TABS
125.0000 ug | ORAL_TABLET | Freq: Every day | ORAL | Status: DC
  Administered 2018-03-19 – 2018-03-20 (×2): 125 ug via ORAL
  Filled 2018-03-18 (×2): qty 1

## 2018-03-18 MED ORDER — QUETIAPINE FUMARATE 50 MG PO TABS
100.0000 mg | ORAL_TABLET | Freq: Every day | ORAL | Status: DC
  Administered 2018-03-18 – 2018-03-19 (×2): 100 mg via ORAL
  Filled 2018-03-18 (×2): qty 2

## 2018-03-18 MED ORDER — INSULIN ASPART 100 UNIT/ML ~~LOC~~ SOLN: 0.0000 [IU] | Freq: Every day | SUBCUTANEOUS | Status: DC

## 2018-03-18 MED ORDER — NORTRIPTYLINE HCL 25 MG PO CAPS
75.0000 mg | ORAL_CAPSULE | Freq: Every day | ORAL | Status: DC
  Administered 2018-03-18 – 2018-03-19 (×2): 75 mg via ORAL
  Filled 2018-03-18 (×2): qty 3

## 2018-03-18 MED ORDER — PANTOPRAZOLE SODIUM 20 MG PO TBEC
20.0000 mg | DELAYED_RELEASE_TABLET | Freq: Every day | ORAL | Status: DC
  Administered 2018-03-18 – 2018-03-20 (×3): 20 mg via ORAL
  Filled 2018-03-18 (×3): qty 1

## 2018-03-18 MED ORDER — INSULIN PUMP
Freq: Three times a day (TID) | SUBCUTANEOUS | Status: DC
  Administered 2018-03-18: 21:00:00 via SUBCUTANEOUS
  Administered 2018-03-18: 11.4 via SUBCUTANEOUS
  Administered 2018-03-19: 02:00:00 via SUBCUTANEOUS
  Administered 2018-03-19: 8.5 via SUBCUTANEOUS
  Administered 2018-03-19 – 2018-03-20 (×4): via SUBCUTANEOUS
  Filled 2018-03-18: qty 1

## 2018-03-18 MED ORDER — ASPIRIN EC 325 MG PO TBEC
325.0000 mg | DELAYED_RELEASE_TABLET | Freq: Every day | ORAL | Status: DC
  Administered 2018-03-18 – 2018-03-20 (×3): 325 mg via ORAL
  Filled 2018-03-18 (×3): qty 1

## 2018-03-18 MED ORDER — DOXYCYCLINE HYCLATE 100 MG PO TABS
100.0000 mg | ORAL_TABLET | Freq: Two times a day (BID) | ORAL | Status: DC
  Administered 2018-03-18 – 2018-03-20 (×5): 100 mg via ORAL
  Filled 2018-03-18 (×5): qty 1

## 2018-03-18 MED ORDER — DULOXETINE HCL 60 MG PO CPEP
60.0000 mg | ORAL_CAPSULE | Freq: Every day | ORAL | Status: DC
  Administered 2018-03-18 – 2018-03-20 (×3): 60 mg via ORAL
  Filled 2018-03-18 (×3): qty 1

## 2018-03-18 MED ORDER — INSULIN ASPART 100 UNIT/ML ~~LOC~~ SOLN
0.0000 [IU] | Freq: Three times a day (TID) | SUBCUTANEOUS | Status: DC
  Administered 2018-03-18: 2 [IU] via SUBCUTANEOUS

## 2018-03-18 MED ORDER — HYDROCODONE-ACETAMINOPHEN 10-325 MG PO TABS
1.0000 | ORAL_TABLET | Freq: Three times a day (TID) | ORAL | Status: DC | PRN
  Administered 2018-03-18 – 2018-03-20 (×3): 1 via ORAL
  Filled 2018-03-18 (×3): qty 1

## 2018-03-18 NOTE — Progress Notes (Signed)
Patient has two consecutive CBG greater than 250. Serum Ketones drawn per MD orders and Jeannette Corpus NP notified.

## 2018-03-18 NOTE — Progress Notes (Signed)
Physical Therapy Treatment Patient Details Name: April Branch MRN: [id number] DOB: [birth year] Today's Date: 03/18/2018    History of Present Illness  April Branch is a 73 y.o. female with medical history significant for type 2 diabetes on insulin pump, hypothyroidism, hypertension, chronic kidney disease stage III, peripheral vascular disease and coronary artery disease, active smoker and chronic pain problems, chronic left hemiparesis with history of a stroke presents to the emergency room with vague complaints including total weakness of the left side of the body happened 2 days ago, CP, cough and shortness of breath.  CT and motion degraded MRI showed no acute event.    PT Comments    Patient is progressing very well towards their physical therapy goals. Increased ambulation distance to 550 ft with walker and supervisio (required one seated rest break),  SpO2 > 90% on 2L O2. Extensive education provided on energy conservation and endurance strategies including prioritizing high energy tasks for morning, modification of tasks and activity progression. D/c plan remains appropriate.     Follow Up Recommendations  Home health PT;Supervision for mobility/OOB     Equipment Recommendations  None recommended by PT    Recommendations for Other Services       Precautions / Restrictions Precautions Precautions: Fall Precaution Comments: monitor O2 sats Restrictions Weight Bearing Restrictions: No    Mobility  Bed Mobility Overal bed mobility: Independent                Transfers Overall transfer level: Modified independent Equipment used: Rolling walker (2 wheeled) Transfers: Sit to/from Stand              Ambulation/Gait Ambulation/Gait assistance: Supervision Gait Distance (Feet): 550 Feet Assistive device: Rolling walker (2 wheeled) Gait Pattern/deviations: Step-through pattern     General Gait Details: Steady with use of walker with turns and  negotiating obstacles.   Stairs             Wheelchair Mobility    Modified Rankin (Stroke Patients Only)       Balance Overall balance assessment: Needs assistance Sitting-balance support: No upper extremity supported;Single extremity supported Sitting balance-Leahy Scale: Good       Standing balance-Leahy Scale: Fair                              Cognition Arousal/Alertness: Awake/alert Behavior During Therapy: WFL for tasks assessed/performed Overall Cognitive Status: Within Functional Limits for tasks assessed                                        Exercises      General Comments        Pertinent Vitals/Pain Pain Assessment: Faces Faces Pain Scale: No hurt    Home Living                      Prior Function            PT Goals (current goals can now be found in the care plan section) Acute Rehab PT Goals Patient Stated Goal: want to go home Potential to Achieve Goals: Good Progress towards PT goals: Progressing toward goals    Frequency    Min 3X/week      PT Plan Current plan remains appropriate    Co-evaluation  AM-PAC PT "6 Clicks" Mobility   Outcome Measure  Help needed turning from your back to your side while in a flat bed without using bedrails?: None Help needed moving from lying on your back to sitting on the side of a flat bed without using bedrails?: None Help needed moving to and from a bed to a chair (including a wheelchair)?: None Help needed standing up from a chair using your arms (e.g., wheelchair or bedside chair)?: None Help needed to walk in hospital room?: None Help needed climbing 3-5 steps with a railing? : A Little 6 Click Score: 23    End of Session Equipment Utilized During Treatment: Gait belt;Oxygen Activity Tolerance: Patient tolerated treatment well Patient left: in bed;with call bell/phone within reach;with bed alarm set   PT Visit Diagnosis:  Other abnormalities of gait and mobility (R26.89);Difficulty in walking, not elsewhere classified (R26.2);Other symptoms and signs involving the nervous system (R29.898)     Time: 0165-5374 PT Time Calculation (min) (ACUTE ONLY): 29 min  Charges:  $Therapeutic Activity: 8-22 mins $Self Care/Home Management: 8-22                     Ellamae Sia, PT, DPT Acute Rehabilitation Services Pager [phone] Office [phone]    Willy Eddy 03/18/2018, 4:25 PM

## 2018-03-18 NOTE — Progress Notes (Signed)
Patient arrived to 88w08. A&O x4. Complains of back pain 10/10, received hydrocodone at 0849. NIH 0. Skin intact. Nurse will continue to monitor. [place]

## 2018-03-18 NOTE — Evaluation (Addendum)
Occupational Therapy Evaluation Patient Details Name: April Branch MRN: [id number] DOB: [birth year] Today's Date: 03/18/2018    History of Present Illness  April Branch is a 73 y.o. female with medical history significant for type 2 diabetes on insulin pump, hypothyroidism, hypertension, chronic kidney disease stage III, peripheral vascular disease and coronary artery disease, active smoker and chronic pain problems, chronic left hemiparesis with history of a stroke presents to the emergency room with vague complaints including total weakness of the left side of the body happened 2 days ago, CP, cough and shortness of breath.  CT and motion degraded MRI showed no acute event.   Clinical Impression   This 73 y/o female presents with the above. At baseline pt reports she is independent with ADL and functional mobility, occasionally using SPC. Pt presenting with generalized weakness and decreased activity tolerance. Able to perform functional mobility in room and hallway using RW this session with overall minguard assist. She currently requires setup assist for seated UB ADL, minguardA for LB ADL. Pt requiring 3L O2 to maintain oxygen sats >90% with activity, on 2L end of session while seated with sats maintaining >90%. Provided education regarding energy conservation strategies and safety with ADL and mobility tasks after return home. She will benefit from continued acute OT services and recommend [facility] therapy services after discharge home. Pt lives with son and reports son typically works during the day, reports she has a friend who is also able to assist PRN; recommend pt have close to 24hr assist/supervision at time of discharge (initially) to ensure pt safety and to reduce risk of falls. Will continue to follow acutely.     Follow Up Recommendations  Home health OT;Supervision/Assistance - 24 hour    Equipment Recommendations  None recommended by OT(pt's DME needs are met)            Precautions / Restrictions Precautions Precautions: Fall Precaution Comments: monitor O2 sats Restrictions Weight Bearing Restrictions: No      Mobility Bed Mobility               General bed mobility comments: received sitting up in recliner  Transfers Overall transfer level: Needs assistance Equipment used: Rolling walker (2 wheeled) Transfers: Sit to/from Stand Sit to Stand: Min guard         General transfer comment: for general safety, no physical assist required, VCs for safe hand placement    Balance Overall balance assessment: Needs assistance Sitting-balance support: No upper extremity supported;Single extremity supported Sitting balance-Leahy Scale: Fair(to good)     Standing balance support: Single extremity supported;No upper extremity supported;Bilateral upper extremity supported Standing balance-Leahy Scale: Fair Standing balance comment: static standing with minguard assist; use of RW for dynamic mobility                            ADL either performed or assessed with clinical judgement   ADL Overall ADL's : Needs assistance/impaired Eating/Feeding: Modified independent;Sitting   Grooming: Brushing hair;Set up;Sitting Grooming Details (indicate cue type and reason): anticipate will require at least minguard assist for standing grooming task Upper Body Bathing: Set up;Sitting   Lower Body Bathing: Min guard;Sit to/from stand   Upper Body Dressing : Set up;Sitting   Lower Body Dressing: Min guard;Sit to/from stand Lower Body Dressing Details (indicate cue type and reason): pt easily able to perform figure 4 technique to adjust socks seated in recliner; minguard for static standing balance Toilet Transfer:  Min guard;Ambulation;Regular Toilet;RW   Toileting- Water quality scientist and Hygiene: Min guard;Sit to/from Nurse, children's Details (indicate cue type and reason): educated on use of 3:1 as shower chair initially  after return home for increased safety and for energy conservation, pt verbalizing understanding  Functional mobility during ADLs: Min guard;Rolling walker General ADL Comments: provided education on energy conservation strategies during ADL and mobility tasks as pt reports she often fatigues with activity at home     Vision         Perception     Praxis      Pertinent Vitals/Pain Pain Assessment: Faces Faces Pain Scale: Hurts little more Pain Location: back Pain Descriptors / Indicators: Discomfort;Sore Pain Intervention(s): Monitored during session;Repositioned     Hand Dominance Right   Extremity/Trunk Assessment Upper Extremity Assessment Upper Extremity Assessment: Generalized weakness   Lower Extremity Assessment Lower Extremity Assessment: Defer to PT evaluation       Communication Communication Communication: No difficulties   Cognition Arousal/Alertness: Awake/alert Behavior During Therapy: WFL for tasks assessed/performed Overall Cognitive Status: Within Functional Limits for tasks assessed                                     General Comments  pt on 3L O2 during mobility with O2 sats maintaining above 90% overall, on 2L end of session while seated with SpO2 >90%    Exercises     Shoulder Instructions      Home Living Family/patient expects to be discharged to:: Private residence Living Arrangements: Children(son is gone much of the day and on w/e's per pt.) Available Help at Discharge: Family;Available PRN/intermittently Type of Home: House Home Access: Stairs to enter   Entrance Stairs-Rails: Right;Left;Can reach both Home Layout: One level;Laundry or work area in basement     [employer] Shower/Tub: Corporate investment banker: [place]: [employer] - single point;Walker - 4 wheels;Wheelchair - manual;Bedside commode   Additional Comments: has 5 dogs  Lives With: Son    Prior  Functioning/Environment Level of Independence: Needs assistance  Gait / Transfers Assistance Needed: uses cane or nothing in the house ADL's / Homemaking Assistance Needed: does wash dishes and sweeps, does not go to basement to do laundry, reports she does not vacuum (though per PT note pt reports she does)    Comments: pt reports she has a friend who assists intermittently as well         OT Problem List: Decreased strength;Impaired balance (sitting and/or standing);Decreased activity tolerance;Cardiopulmonary status limiting activity      OT Treatment/Interventions: Self-care/ADL training;Therapeutic exercise;DME and/or AE instruction;Patient/family education;Balance training;Therapeutic activities;Energy conservation    OT Goals(Current goals can be found in the care plan section) Acute Rehab OT Goals Patient Stated Goal: want to go home OT Goal Formulation: With patient Time For Goal Achievement: 04/01/18 Potential to Achieve Goals: Good  OT Frequency: Min 2X/week   Barriers to D/C: Decreased caregiver support(reports son works during the day)          Co-evaluation              AM-PAC OT "6 Clicks" Daily Activity     Outcome Measure Help from another person eating meals?: None Help from another person taking care of personal grooming?: None Help from another person toileting, which includes using toliet, bedpan, or urinal?: None Help from another person  bathing (including washing, rinsing, drying)?: None Help from another person to put on and taking off regular upper body clothing?: None Help from another person to put on and taking off regular lower body clothing?: A Little 6 Click Score: 23   End of Session Equipment Utilized During Treatment: Gait belt;Rolling walker;Oxygen Nurse Communication: Mobility status  Activity Tolerance: Patient tolerated treatment well Patient left: in chair;with call bell/phone within reach  OT Visit Diagnosis: Muscle weakness  (generalized) (M62.81);Unsteadiness on feet (R26.81)                Time: 5859-2924 OT Time Calculation (min): 43 min Charges:  OT General Charges $OT Visit: 1 Visit OT Evaluation $OT Eval Moderate Complexity: 1 Mod OT Treatments $Self Care/Home Management : 23-37 mins  Lou Cal, OT Supplemental Rehabilitation Services Pager [phone] Office [phone]  Raymondo Band 03/18/2018, 9:45 AM

## 2018-03-18 NOTE — Progress Notes (Addendum)
Patients CBG was 434 at 1750. Patient is using home insulin pump which instructed her to give 11.40 of Novolog. Per orders, nurse will recheck at 1850. Dr. Algis Liming notified. Nurse will continue to monitor. [place]

## 2018-03-18 NOTE — Care Management Note (Signed)
Case Management Note  Patient Details  Name: April Branch MRN: [id number] Date of Birth: 12/31/1945  Subjective/Objective:    Pt admitted with lt sided weakness. She is from home with her son. Pt has intermittent supervision at home but not 24 hour. DME: shower chair, walker No issues obtaining her meds.  No transportation issues.                Action/Plan: Recommendations are for [facility] services. CM provided her choice and she selected [place]. Butch Penny with [facility] accepted the referral. MD please place [facility] orders and F2F. CM following for further d/c needs.   Expected Discharge Date:                  Expected Discharge Plan:  [place]  In-House Referral:     Discharge planning Services  CM Consult  Post Acute Care Choice:  Home Health Choice offered to:     DME Arranged:    DME Agency:     HH Arranged:    [place] Agency:  [place]  Status of Service:  In process, will continue to follow  If discussed at Long Length of Stay Meetings, dates discussed:    Additional Comments:  Pollie Friar, RN 03/18/2018, 3:01 PM

## 2018-03-18 NOTE — Progress Notes (Signed)
  Speech Language Pathology Treatment: Dysphagia  Patient Details Name: April Branch MRN: [id number] DOB: 10-Jun-1945 Today's Date: 03/18/2018 Time: 1035-1050 SLP Time Calculation (min) (ACUTE ONLY): 15 min  Assessment / Plan / Recommendation Clinical Impression  Pt was seen in her room for treatment to assess her tolerance of the current diet. She denied any difficulty with meals despite her not liking the food and stated that she has been tolerating medications whole in applesauce. She tolerated mechanical soft solids, mixed consistency boluses (chopped fruit with thin liquids) and consecutive swallows of thin liquids without overt s/sx of aspiration. Mastication time was within functional limits despite reduced dentition. No oral residue was noted and she did not demonstrate any symptoms of of pharyngeal residue. Pt appears to be tolerating the current diet well and SLP services will be discontinued at this time. Pt and nursing have been educated regarding this plan and both parties have verbalized agreement.     HPI HPI: Pt is a 73 y.o. female with medical history significant of type 2 diabetes, hypothyroidism, hypertension, chronic kidney disease stage III, peripheral vascular disease and coronary artery disease. She is an active smoker with chronic pain problems, and chronic left hemiparesis s/p CVA. She presented to the emergency room with vague complaints including total weakness of the left side of the body happened 2 days ago, cough and shortness of breath. MRI of 03/17/18 was negative for acute infarct. She became progressively dyspneic and hypoxic, chest x-ray consistent with pulmonary edema, PCCM was consulted and patient was admitted to ICU on BiPAP with improvement. SLP was consulted for speech/language evaluation, and due to the pt failing the [facility] swallow screen.       SLP Plan  All goals met;Discharge SLP treatment due to (comment)(All goals have been met)        Recommendations  Diet recommendations: Dysphagia 3 (mechanical soft);Thin liquid Liquids provided via: Cup;Straw Medication Administration: Whole meds with puree Supervision: Patient able to self feed Compensations: Slow rate;Small sips/bites Postural Changes and/or Swallow Maneuvers: Seated upright 90 degrees                Oral Care Recommendations: Oral care BID Follow up Recommendations: None SLP Visit Diagnosis: Dysphagia, unspecified (R13.10) Plan: All goals met;Discharge SLP treatment due to (comment)(All goals have been met)       Tobie Poet I. Hardin Negus, [place], [place] Office number [phone] Pager [place] 03/18/2018, 10:47 AM

## 2018-03-18 NOTE — Progress Notes (Signed)
PROGRESS NOTE   April Branch  GGY:[id number]    DOB: [birth year]    DOA: 03/16/2018  PCP: [name], MD   I have briefly reviewed patients previous medical records in [facility]  Brief Narrative:  73 year old female with PMH of DM 2 on insulin pump, HTN, HLD, hypothyroid, CKD, prior strokes/TIAs with residual left hemiparesis, nonobstructive CAD by cath, COPD, anxiety and depression, GERD, tobacco abuse, chronic pain presented to ED on 03/16/2018 with complaints of left-sided weakness and numbness with inability to walk of 2 days duration, improved by the time she came to ER and cough, wheezing and progressive dyspnea.  In ED code stroke was initiated, CT head without acute findings, became progressively dyspneic and hypoxic, chest x-ray consistent with pulmonary edema, PCCM was consulted and patient was admitted to ICU on BiPAP with improvement.  Neurology consulted.  Clinically improving.   Assessment & Plan:   Principal Problem:   Left-sided weakness Active Problems:   Hypothyroidism   Insulin dependent type 2 diabetes mellitus, controlled (HCC)   DISORDER, TOBACCO USE   Essential hypertension   COPD (chronic obstructive pulmonary disease) (HCC)   CAD (coronary artery disease)   Acute kidney injury superimposed on CKD (HCC)   CKD (chronic kidney disease), stage III (HCC)   Wheezy bronchitis   TIA (transient ischemic attack)   Acute respiratory failure (HCC)   Respiratory failure with hypoxia ([place])   Acute pulmonary edema (HCC)   Goals of care, counseling/discussion   Acute on chronic left-sided weakness/? TIA, history of prior CVA/TIAs CT head: No acute findings MRI brain: Negative for acute infarct. Neurology consultation and stroke team follow-up appreciated.  Discussed with stroke MD and suspects TIA. LDL 64 A1c 7.5 TTE: LVEF 60-65% Carotid Dopplers: Bilateral ICA consistent with 40-59% stenosis.  Vertebral artery flow antegrade. TCD: Suggestive of  intracranial atherosclerosis. Continue aspirin 325 mg daily. PT evaluated and recommend home health PT if independent before DC and has 24/7 supervision and assistance otherwise needs [facility] SNF.  Await PT follow-up.  OT recommends home health OT.  SLP recommend dysphagia 3 diet and thin liquids. Neurology signed off.  Acute hypoxic respiratory failure: Progressive dyspnea and hypoxia noted on night of admission. Suspect due to pulmonary edema. Transferred to ICU and CCM consulted.  Treated with a dose of IV Lasix and BiPAP. BiPAP transitioned to high flow nasal cannula oxygen and then regular nasal cannula oxygen. PCCM consulted and signed off 1/23. Wean oxygen for saturations of 88-92%.  Reassess home oxygen requirement prior to discharge. Continue Brovana/Pulmicort nebulizations, duo nebs every 6 hourly and albuterol as needed. Reduced IV Solu-Medrol to 40 mg daily-discontinued after 1/24 dose, consider oral prednisone taper from tomorrow. RSV panel negative.  Complete empirically started doxycycline. Chest x-ray 1/23 personally reviewed: Improved variation in right lung with persistent bilateral mixed interstitial and airspace opacities and small right pleural effusion.  Repeated dose of IV Lasix 40 mg x 1 on 1/24. Modified diet as per speech therapy Saturating in the low 90s on 1 L/min oxygen.  Will need to do desaturation protocol prior to discharge.  COPD exacerbation No clinical bronchospasm at this time.  Management as noted above. Tobacco cessation counseled. Patient has follow-up appointment with pulmonology on 03/31/2018 at 9 AM. Steroids management as above.  Tobacco abuse Cessation counseled.  Continue nicotine patch.  Acute diastolic CHF Diuretic management as noted above.  -1.173 L since admission.  Clinically euvolemic.  Essential hypertension:  Controlled.  Holding oral medications.  Type II DM/insulin pump Follows with Dr. [name], endocrinology. A1c 7.5. Currently not  on insulin pump.  Continue Levemir 15 units twice daily and SSI.  Mildly uncontrolled and fluctuating.  Adjust insulins as needed and after diet resumption.  Resume insulin pump at discharge. Diabetes coordinator follow-up appreciated.  Acute kidney injury on stage III chronic kidney disease Baseline creatinine not exactly known.  Creatinine in November and December of last year with in the 1.2-1.3 range. Presented with creatinine of 1.97.  Creatinine has improved to 1.5.  Follow BMP in a.m.  Hypothyroid Continue prior home dose of Synthroid.  Low a.m. cortisol ?  Adrenal insufficiency.  However remains normotensive.  Already on Solu-Medrol for acute respiratory failure.  Chronic normocytic anemia ?  Chronic disease versus chronic kidney disease.  Stable.  Chronic pain/anxiety and depression Currently controlled.  Resumed Cymbalta, Neurontin, and nortriptyline.  UDS positive for opiates.  Acute urinary retention Reportedly had in and out catheterization overnight yielding 900+ mL urine.  Monitor for voiding and check bladder scan as needed.  Seems to have resolved.  Constipation Noted on abdominal x-ray.  Started bowel regimen.   DVT prophylaxis: Subcutaneous heparin Code Status: DNR Family Communication: None at bedside.  Unsuccessfully attempted to reach family via phone. Disposition: Admitted to ICU.  Medically improved and transferred to progressive care unit on 1/23.  DC home pending further clinical improvement, hopefully 1/25   Consultants:  Neurology PCCM  Procedures:  BiPAP  Antimicrobials:  Doxycycline   Subjective: Reports that her dyspnea has improved and breathing is close to but not yet back to normal.  No pain reported.  No left sided weakness.  ROS: As above, otherwise negative.  Objective:  Vitals:   03/18/18 0900 03/18/18 1042 03/18/18 1221 03/18/18 1442  BP:  (!) 130/58 (!) 127/49   Pulse: 96 94 96   Resp: (!) 22 18 18    Temp:  97.7 F (36.5  C) 98.3 F (36.8 C)   TempSrc:  Oral Oral   SpO2: 92% (!) 86% 96% 91%  Weight:      Height:        Examination:  General exam: Pleasant elderly female, moderately built and nourished lying comfortably propped up in bed. Respiratory system: Much improved breath sounds.  Slightly diminished at the bases with occasional fine crackles but otherwise clear to auscultation Cardiovascular system: S1 & S2 heard, RRR. No JVD, murmurs, rubs, gallops or clicks. No pedal edema.  Telemetry personally reviewed: Sinus rhythm. Gastrointestinal system: Abdomen is nondistended, soft and nontender. No organomegaly or masses felt. Normal bowel sounds heard.  Stable Central nervous system: Alert and oriented. No focal neurological deficits.  No change/stable Extremities:?  Left side grade 4+ by 5 power.  Right side grade 5 x 5 per.  No pronator drift. Skin: No rashes, lesions or ulcers Psychiatry: Judgement and insight appear somewhat impaired. Mood & affect appropriate.     Data Reviewed: I have personally reviewed following labs and imaging studies  CBC: Recent Labs  Lab 03/16/18 0617 03/16/18 0753 03/16/18 1801 03/17/18 0507 03/18/18 0658  WBC 11.8*  --   --  17.0* 20.3*  NEUTROABS 8.8*  --   --   --   --   HGB 10.3* 10.9* 10.5* 10.1* 9.9*  HCT 32.4* 32.0* 31.0* 31.8* 31.0*  MCV 84.2  --   --  81.7 82.0  PLT 359  --   --  345 024   Basic Metabolic Panel: Recent Labs  Lab 03/16/18  8416 03/16/18 0753 03/16/18 1801 03/16/18 1902 03/17/18 0507 03/18/18 0658  NA 134* 136 135  --  136 138  K 4.3 4.3 4.9  --  3.5 3.6  CL 102  --   --   --  104 106  CO2 19*  --   --   --  21* 23  GLUCOSE 105*  --   --   --  164* 82  BUN 29*  --   --   --  29* 33*  CREATININE 1.97*  --   --   --  1.94* 1.59*  CALCIUM 9.0  --   --   --  8.6* 9.2  MG  --   --   --  1.7  --  1.8  PHOS  --   --   --  5.6* 4.1 3.5   Liver Function Tests: Recent Labs  Lab 03/16/18 0617 03/17/18 0507  AST 15  --   ALT  14  --   ALKPHOS 125  --   BILITOT 0.6  --   PROT 7.6  --   ALBUMIN 3.4* 2.9*   Coagulation Profile: Recent Labs  Lab 03/16/18 0617  INR 1.04   HbA1C: Recent Labs    03/17/18 0507  HGBA1C 7.5*   CBG: Recent Labs  Lab 03/17/18 1936 03/18/18 0015 03/18/18 0339 03/18/18 0828 03/18/18 1124  GLUCAP 318* 329* 178* 92 138*    Recent Results (from the past 240 hour(s))  Respiratory Panel by PCR     Status: None   Collection Time: 03/16/18  8:31 PM  Result Value Ref Range Status   Adenovirus NOT DETECTED NOT DETECTED Final   Coronavirus 229E NOT DETECTED NOT DETECTED Final   Coronavirus HKU1 NOT DETECTED NOT DETECTED Final   Coronavirus NL63 NOT DETECTED NOT DETECTED Final   Coronavirus OC43 NOT DETECTED NOT DETECTED Final   Metapneumovirus NOT DETECTED NOT DETECTED Final   Rhinovirus / Enterovirus NOT DETECTED NOT DETECTED Final   Influenza A NOT DETECTED NOT DETECTED Final   Influenza B NOT DETECTED NOT DETECTED Final   Parainfluenza Virus 1 NOT DETECTED NOT DETECTED Final   Parainfluenza Virus 2 NOT DETECTED NOT DETECTED Final   Parainfluenza Virus 3 NOT DETECTED NOT DETECTED Final   Parainfluenza Virus 4 NOT DETECTED NOT DETECTED Final   Respiratory Syncytial Virus NOT DETECTED NOT DETECTED Final   Bordetella pertussis NOT DETECTED NOT DETECTED Final   Chlamydophila pneumoniae NOT DETECTED NOT DETECTED Final   Mycoplasma pneumoniae NOT DETECTED NOT DETECTED Final    Comment: Performed at [facility], [place]. [address] [place], [place] [address]  MRSA PCR Screening     Status: None   Collection Time: 03/16/18  8:31 PM  Result Value Ref Range Status   MRSA by PCR NEGATIVE NEGATIVE Final    Comment:        The GeneXpert MRSA Assay (FDA approved for NASAL specimens only), is one component of a comprehensive MRSA colonization surveillance program. It is not intended to diagnose MRSA infection nor to guide or monitor treatment for MRSA infections. Performed  at [facility], [place] [address] [place], [place] [address]   Culture, blood (Routine X 2) w Reflex to ID Panel     Status: None (Preliminary result)   Collection Time: 03/16/18  9:00 PM  Result Value Ref Range Status   Specimen Description BLOOD RIGHT ANTECUBITAL  Final   Special Requests   Final    BOTTLES  DRAWN AEROBIC ONLY Blood Culture results may not be optimal due to an inadequate volume of blood received in culture bottles   Culture   Final    NO GROWTH 2 DAYS Performed at [facility], [place] [address] [place], [place] [address]    Report Status PENDING  Incomplete  Culture, blood (Routine X 2) w Reflex to ID Panel     Status: None (Preliminary result)   Collection Time: 03/16/18  9:09 PM  Result Value Ref Range Status   Specimen Description BLOOD RIGHT ANTECUBITAL  Final   Special Requests   Final    BOTTLES DRAWN AEROBIC ONLY Blood Culture results may not be optimal due to an inadequate volume of blood received in culture bottles   Culture   Final    NO GROWTH 2 DAYS Performed at [facility], [place] [address] [place], [place] [address]    Report Status PENDING  Incomplete         Radiology Studies: Dg Chest 1 View  Result Date: 03/16/2018 CLINICAL DATA:  Dyspnea with agitation EXAM: CHEST  1 VIEW COMPARISON:  03/16/2018 at 0756 hours FINDINGS: Stable cardiomegaly. Nonaneurysmal atherosclerosis of the aorta. Interval increase in interstitial edema superimposed on coarsened interstitial markings. No pneumothorax or significant pleural effusions given the frontal projection. No acute osseous abnormality. IMPRESSION: Interval increase in interstitial edema superimposed on coarsened interstitial markings. Stable cardiomegaly with aortic atherosclerosis. Electronically Signed   By: Ashley Royalty M.D.   On: 03/16/2018 18:09   Dg Chest Port 1 View  Result Date: 03/17/2018 CLINICAL DATA:  73 year old female with a history of acute congestive heart failure EXAM:  PORTABLE CHEST 1 VIEW COMPARISON:  03/16/2018, 03/16/2018, 12/10/2017 FINDINGS: Cardiomediastinal silhouette unchanged with cardiomegaly. Similar appearance of mixed interstitial and airspace opacities of the bilateral lungs with improved aeration on the right. Superimposed interlobular septal thickening. No pneumothorax. Blunting of the right costophrenic angle. IMPRESSION: Improved aeration of the right lung, with persisting bilateral mixed interstitial and airspace opacity. Small right pleural effusion. Electronically Signed   By: Corrie Mckusick D.O.   On: 03/17/2018 09:24   Vas US Carotid (at [place] Only)  Result Date: 03/18/2018 Carotid Arterial Duplex Study Indications: TIA. Performing Technologist: Abram Sander RVS  Examination Guidelines: A complete evaluation includes B-mode imaging, spectral Doppler, color Doppler, and power Doppler as needed of all accessible portions of each vessel. Bilateral testing is considered an integral part of a complete examination. Limited examinations for reoccurring indications may be performed as noted.  Right Carotid Findings: +----------+--------+--------+--------+------------+--------+           PSV cm/sEDV cm/sStenosisDescribe    Comments +----------+--------+--------+--------+------------+--------+ CCA Prox  114                     heterogenous         +----------+--------+--------+--------+------------+--------+ CCA Distal143     13              heterogenous         +----------+--------+--------+--------+------------+--------+ ICA Prox  164     40      40-59%  heterogenous         +----------+--------+--------+--------+------------+--------+ ICA Distal130     25                                   +----------+--------+--------+--------+------------+--------+ ECA       160                                          +----------+--------+--------+--------+------------+--------+  +----------+--------+-------+--------+-------------------+  PSV cm/sEDV cmsDescribeArm Pressure (mmHG) +----------+--------+-------+--------+-------------------+ Subclavian122                                        +----------+--------+-------+--------+-------------------+ +---------+--------+--+--------+--+---------+ VertebralPSV cm/s50EDV cm/s15Antegrade +---------+--------+--+--------+--+---------+  Left Carotid Findings: +----------+--------+--------+--------+------------+--------+           PSV cm/sEDV cm/sStenosisDescribe    Comments +----------+--------+--------+--------+------------+--------+ CCA Prox  108     14              heterogenous         +----------+--------+--------+--------+------------+--------+ CCA [id number]     14              heterogenous         +----------+--------+--------+--------+------------+--------+ ICA Prox  220     49      40-59%  heterogenous         +----------+--------+--------+--------+------------+--------+ ICA Distal101     20                                   +----------+--------+--------+--------+------------+--------+ ECA       195     6                                    +----------+--------+--------+--------+------------+--------+ +----------+--------+--------+--------+-------------------+ SubclavianPSV cm/sEDV cm/sDescribeArm Pressure (mmHG) +----------+--------+--------+--------+-------------------+           228                                         +----------+--------+--------+--------+-------------------+ +---------+--------+--+--------+--+---------+ VertebralPSV cm/s58EDV cm/s10Antegrade +---------+--------+--+--------+--+---------+  Summary: Right Carotid: Velocities in the right ICA are consistent with a 40-59%                stenosis. Left Carotid: Velocities in the left ICA are consistent with a 40-59% stenosis. Vertebrals: Bilateral vertebral arteries demonstrate  antegrade flow. *See table(s) above for measurements and observations.  Electronically signed by Antony Contras MD on 03/18/2018 at 12:25:24 PM.    Final    Vas [place] Transcranial Doppler  Result Date: 03/17/2018  Transcranial Doppler Indications: Stroke. Performing Technologist: Abram Sander RVS  Examination Guidelines: A complete evaluation includes B-mode imaging, spectral Doppler, color Doppler, and power Doppler as needed of all accessible portions of each vessel. Bilateral testing is considered an integral part of a complete examination. Limited examinations for reoccurring indications may be performed as noted.  +----------+-------------+----------+-----------+-------+ RIGHT TCD Right VM (cm)Depth (cm)PulsatilityComment +----------+-------------+----------+-----------+-------+ MCA           54.00                 1.52            +----------+-------------+----------+-----------+-------+ ACA           25.00                 1.87            +----------+-------------+----------+-----------+-------+ Term ICA      21.00                 1.32            +----------+-------------+----------+-----------+-------+ Opthalmic     15.00  1.84            +----------+-------------+----------+-----------+-------+ ICA siphon    25.00                 1.54            +----------+-------------+----------+-----------+-------+  +----------+------------+----------+-----------+-------+ LEFT TCD  Left VM (cm)Depth (cm)PulsatilityComment +----------+------------+----------+-----------+-------+ MCA          50.00                 1.41            +----------+------------+----------+-----------+-------+ Term ICA     39.00                 1.55            +----------+------------+----------+-----------+-------+ PCA          39.00                 1.31            +----------+------------+----------+-----------+-------+ Opthalmic    18.00                 1.69             +----------+------------+----------+-----------+-------+ ICA siphon   49.00                 1.75            +----------+------------+----------+-----------+-------+ Distal ICA   26.00                 1.60            +----------+------------+----------+-----------+-------+     Preliminary         Scheduled Meds: . arformoterol  15 mcg Nebulization BID  . aspirin EC  325 mg Oral Daily  . bisacodyl  10 mg Rectal Once  . budesonide (PULMICORT) nebulizer solution  0.5 mg Nebulization BID  . chlorhexidine  15 mL Mouth Rinse BID  . cilostazol  100 mg Oral BID AC  . doxycycline  100 mg Oral Q12H  . DULoxetine  60 mg Oral Daily  . ferrous sulfate  325 mg Oral Q breakfast  . gabapentin  300 mg Oral BID  . heparin injection (subcutaneous)  5,000 Units Subcutaneous Q8H  . insulin aspart  0-15 Units Subcutaneous TID WC  . insulin aspart  0-5 Units Subcutaneous QHS  . insulin detemir  15 Units Subcutaneous BID  . ipratropium-albuterol  3 mL Nebulization Q6H  . levothyroxine  125 mcg Oral Q0600  . mouth rinse  15 mL Mouth Rinse q12n4p  . methylPREDNISolone (SOLU-MEDROL) injection  40 mg Intravenous Daily  . nicotine  14 mg Transdermal Daily  . nortriptyline  75 mg Oral QHS  . pantoprazole  20 mg Oral Daily  . QUEtiapine  100 mg Oral QHS   Continuous Infusions:    LOS: 2 days     Vernell Leep, MD, FACP, [facility]. [name]  To contact the attending provider between 7A-7P or the covering provider during after hours 7P-7A, please log into the web site [email or web address] and access using universal [place] password for that web site. If you do not have the password, please call the hospital operator.  03/18/2018, 2:57 PM

## 2018-03-18 NOTE — Progress Notes (Signed)
STROKE TEAM PROGRESS NOTE   SUBJECTIVE  patient is doing better. She still on nasal cannula oxygen but is walking in the unit with the occupational therapists. She has no neurological complaints. Carotid ultrasound shows 40-59% bilateral stenosis. Transcranial Doppler study shows normal mean flow velocities anterior circulation but absent occipital window limits posterior circulation evaluation. Diffuse increase in pulsatility indexes suggestive intracranial atherosclerosis likely. No new complaints.  OBJECTIVE Vitals Blood pressure (!) 127/49, pulse 96, temperature 98.3 F (36.8 C), temperature source Oral, resp. rate 18, height 5\' 1"  (1.549 m), weight 75.1 kg, SpO2 96 %.   PHYSICAL EXAM : Pleasant elderly Caucasian lady currently not in mild respiratory distress.on oxygen mask. . Afebrile. Head is nontraumatic. Neck is supple without bruit.    Cardiac exam no murmur or gallop. Lungs are clear to auscultation. Distal pulses are well felt. Neurological Exam ;  Awake  Alert oriented x 3. Normal speech and language.eye movements full without nystagmus.fundi were not visualized. Vision acuity and fields appear normal. Hearing is normal. Palatal movements are normal. Face symmetric. Tongue midline. Normal strength, tone, reflexes and coordination. Normal sensation. Gait deferred.  Medications . arformoterol  15 mcg Nebulization BID  . aspirin EC  325 mg Oral Daily  . bisacodyl  10 mg Rectal Once  . budesonide (PULMICORT) nebulizer solution  0.5 mg Nebulization BID  . chlorhexidine  15 mL Mouth Rinse BID  . cilostazol  100 mg Oral BID AC  . doxycycline  100 mg Oral Q12H  . DULoxetine  60 mg Oral Daily  . ferrous sulfate  325 mg Oral Q breakfast  . gabapentin  300 mg Oral BID  . heparin injection (subcutaneous)  5,000 Units Subcutaneous Q8H  . insulin aspart  0-15 Units Subcutaneous TID WC  . insulin aspart  0-5 Units Subcutaneous QHS  . insulin detemir  15 Units Subcutaneous BID  .  ipratropium-albuterol  3 mL Nebulization Q6H  . levothyroxine  125 mcg Oral Q0600  . mouth rinse  15 mL Mouth Rinse q12n4p  . methylPREDNISolone (SOLU-MEDROL) injection  40 mg Intravenous Daily  . nicotine  14 mg Transdermal Daily  . nortriptyline  75 mg Oral QHS  . pantoprazole  20 mg Oral Daily  . QUEtiapine  100 mg Oral QHS     Pertinent Laboratory Studies/Diagnostics Recent Labs    03/16/18 0617  03/16/18 1801 03/17/18 0507 03/18/18 0658  WBC 11.8*  --   --  17.0* 20.3*  HGB 10.3*   < > 10.5* 10.1* 9.9*  PLT 359  --   --  345 381  NA 134*   < > 135 136 138  K 4.3   < > 4.9 3.5 3.6  CREATININE 1.97*  --   --  1.94* 1.59*  GLUCOSE 105*  --   --  164* 82   < > = values in this interval not displayed.    Vas US Carotid (at [place] Only)  Result Date: 03/18/2018 Carotid Arterial Duplex Study Indications: TIA. Performing Technologist: Abram Sander RVS  Examination Guidelines: A complete evaluation includes B-mode imaging, spectral Doppler, color Doppler, and power Doppler as needed of all accessible portions of each vessel. Bilateral testing is considered an integral part of a complete examination. Limited examinations for reoccurring indications may be performed as noted.  Right Carotid Findings: +----------+--------+--------+--------+------------+--------+           PSV cm/sEDV cm/sStenosisDescribe    Comments +----------+--------+--------+--------+------------+--------+ CCA Prox  114  heterogenous         +----------+--------+--------+--------+------------+--------+ CCA Distal143     13              heterogenous         +----------+--------+--------+--------+------------+--------+ ICA Prox  164     40      40-59%  heterogenous         +----------+--------+--------+--------+------------+--------+ ICA Distal130     25                                   +----------+--------+--------+--------+------------+--------+ ECA       160                                           +----------+--------+--------+--------+------------+--------+ +----------+--------+-------+--------+-------------------+           PSV cm/sEDV cmsDescribeArm Pressure (mmHG) +----------+--------+-------+--------+-------------------+ Subclavian122                                        +----------+--------+-------+--------+-------------------+ +---------+--------+--+--------+--+---------+ VertebralPSV cm/s50EDV cm/s15Antegrade +---------+--------+--+--------+--+---------+  Left Carotid Findings: +----------+--------+--------+--------+------------+--------+           PSV cm/sEDV cm/sStenosisDescribe    Comments +----------+--------+--------+--------+------------+--------+ CCA Prox  108     14              heterogenous         +----------+--------+--------+--------+------------+--------+ CCA [id number]     14              heterogenous         +----------+--------+--------+--------+------------+--------+ ICA Prox  220     49      40-59%  heterogenous         +----------+--------+--------+--------+------------+--------+ ICA Distal101     20                                   +----------+--------+--------+--------+------------+--------+ ECA       195     6                                    +----------+--------+--------+--------+------------+--------+ +----------+--------+--------+--------+-------------------+ SubclavianPSV cm/sEDV cm/sDescribeArm Pressure (mmHG) +----------+--------+--------+--------+-------------------+           228                                         +----------+--------+--------+--------+-------------------+ +---------+--------+--+--------+--+---------+ VertebralPSV cm/s58EDV cm/s10Antegrade +---------+--------+--+--------+--+---------+  Summary: Right Carotid: Velocities in the right ICA are consistent with a 40-59%                stenosis. Left Carotid: Velocities in the left  ICA are consistent with a 40-59% stenosis. Vertebrals: Bilateral vertebral arteries demonstrate antegrade flow. *See table(s) above for measurements and observations.  Electronically signed by Antony Contras MD on 03/18/2018 at 12:25:24 PM.    Final    Vas [place] Transcranial Doppler  Result Date: 03/17/2018  Transcranial Doppler Indications: Stroke. Performing Technologist: Abram Sander RVS  Examination Guidelines: A complete evaluation includes B-mode imaging, spectral Doppler, color Doppler, and power Doppler as  needed of all accessible portions of each vessel. Bilateral testing is considered an integral part of a complete examination. Limited examinations for reoccurring indications may be performed as noted.  +----------+-------------+----------+-----------+-------+ RIGHT TCD Right VM (cm)Depth (cm)PulsatilityComment +----------+-------------+----------+-----------+-------+ MCA           54.00                 1.52            +----------+-------------+----------+-----------+-------+ ACA           25.00                 1.87            +----------+-------------+----------+-----------+-------+ Term ICA      21.00                 1.32            +----------+-------------+----------+-----------+-------+ Opthalmic     15.00                 1.84            +----------+-------------+----------+-----------+-------+ ICA siphon    25.00                 1.54            +----------+-------------+----------+-----------+-------+  +----------+------------+----------+-----------+-------+ LEFT TCD  Left VM (cm)Depth (cm)PulsatilityComment +----------+------------+----------+-----------+-------+ MCA          50.00                 1.41            +----------+------------+----------+-----------+-------+ Term ICA     39.00                 1.55            +----------+------------+----------+-----------+-------+ PCA          39.00                 1.31             +----------+------------+----------+-----------+-------+ Opthalmic    18.00                 1.69            +----------+------------+----------+-----------+-------+ ICA siphon   49.00                 1.75            +----------+------------+----------+-----------+-------+ Distal ICA   26.00                 1.60            +----------+------------+----------+-----------+-------+     IMPRESSION : normal mean flow velocities anterior circulation but absent occipital window limits posterior circulation evaluation. Diffuse increase in pulsatility indexes suggestive intracranial atherosclerosis likely. No    ASSESSMENT Ms. SHARNICE BOSLER is a 73 y.o. female with history of HTN, COPD, HLD, CAD, DM CKD, tobacco abuse, and hypothyroidism presenting with CP, sudden left arm and leg numbness.  Possible TIA        RECOMMENDATIONS : Continue aspirin and management of respiratory status as per primary medical team. Aggressive risk factor modification  stroke team will sign off. Kindly call for questions. Discussed with Dr. [facility] day # [place], [place] for Schedule & Pager information 03/18/2018 12:29 PM    To contact Stroke Continuity provider, please refer to [email or web address] After hours,  contact General Neurology

## 2018-03-18 NOTE — Progress Notes (Signed)
Results for April Branch, April Branch (MRN [id number]) as of 03/18/2018 16:17  Ref. Range 03/18/2018 00:15 03/18/2018 03:39 03/18/2018 08:28 03/18/2018 11:24  Glucose-Capillary Latest Ref Range: 70 - 99 mg/dL 329 (H) 178 (H) 92 138 (H)    Home DM Meds: Insulin Pump    Called by RN on unit 3W.  Pt's significant other brought insulin pump and all insulin pump supplies to hospital this afternoon and pt would like to resume her insulin pump if possible  Received 15 units of Levemir this morning at 10am.  Patient A&O and able to independently manage insulin pump.  Has Omni pod reservoir (pod), Insulin, and PDM used to run the insulin pump.  Also has charger to recharge the PDM as the battery is down to 15% this afternoon.  Plans to charge PDM asap.  Patient was able to verbally walk me through how she will put her pump on later this afternoon.  Called Dr. Cleta Alberts and Dr. Algis Liming gave me permission to allow pt to restart her insulin pump at 6pm tonight and d/c the Levemir and Novolog SSi orders.  We will check pt's CBGs with the hospital meter and pt will tell the RN how much insulin she boluses herself with around mealtimes.  Insulin Pump orders entered and SQ Insulin Levemir and Novolog orders d/c'd.  All orders reviewed with both patient and RN and both patient and RN know the plan and all documentation needs.  Did caution patient and RN to be watchful for possible Hypoglycemia as pt still has some Levemir on board from this AM.    --Insulin Pump Settings--  Basal Rates: 12am- 1.35 units/hr 8am- 2 units/hr 6pm- 1.8 units/hr  Total Basal Insulin per 24 hours period= 41.6 units  Carbohydrate Ratio: 1 unit for every 10 Grams of Carbohydrates  Correction/Sensitivity Factor: 1 unit for every 75 mg/dl above Target CBG  Target CBG: 125 mg/dl    --Will follow patient during hospitalization--  Wyn Quaker RN, MSN, CDE Diabetes Coordinator Inpatient Glycemic Control Team Team Pager:  [phone] (8a-5p)

## 2018-03-19 ENCOUNTER — Inpatient Hospital Stay (HOSPITAL_COMMUNITY): Payer: Medicare Other

## 2018-03-19 LAB — BASIC METABOLIC PANEL
ANION GAP: 10 (ref 5–15)
BUN: 39 mg/dL — ABNORMAL HIGH (ref 8–23)
CHLORIDE: 104 mmol/L (ref 98–111)
CO2: 23 mmol/L (ref 22–32)
Calcium: 9 mg/dL (ref 8.9–10.3)
Creatinine, Ser: 1.68 mg/dL — ABNORMAL HIGH (ref 0.44–1.00)
GFR calc Af Amer: 35 mL/min — ABNORMAL LOW (ref >60)
GFR calc non Af Amer: 30 mL/min — ABNORMAL LOW (ref >60)
Glucose, Bld: 224 mg/dL — ABNORMAL HIGH (ref 70–99)
Potassium: 3.5 mmol/L (ref 3.5–5.1)
Sodium: 137 mmol/L (ref 135–145)

## 2018-03-19 LAB — GLUCOSE, CAPILLARY
GLUCOSE-CAPILLARY: 69 mg/dL — AB (ref 70–99)
GLUCOSE-CAPILLARY: 314 mg/dL — AB (ref 70–99)
Glucose-Capillary: 270 mg/dL — ABNORMAL HIGH (ref 70–99)
Glucose-Capillary: 168 mg/dL — ABNORMAL HIGH (ref 70–99)
Glucose-Capillary: 102 mg/dL — ABNORMAL HIGH (ref 70–99)
Glucose-Capillary: 302 mg/dL — ABNORMAL HIGH (ref 70–99)
Glucose-Capillary: 301 mg/dL — ABNORMAL HIGH (ref 70–99)

## 2018-03-19 MED ORDER — PREDNISONE 5 MG PO TABS
30.0000 mg | ORAL_TABLET | Freq: Every day | ORAL | Status: DC
  Administered 2018-03-19 – 2018-03-20 (×2): 30 mg via ORAL
  Filled 2018-03-19 (×2): qty 1

## 2018-03-19 MED ORDER — FUROSEMIDE 10 MG/ML IJ SOLN
40.0000 mg | Freq: Once | INTRAMUSCULAR | Status: AC
  Administered 2018-03-19: 40 mg via INTRAVENOUS
  Filled 2018-03-19: qty 4

## 2018-03-19 NOTE — Progress Notes (Signed)
CBG 314. Patient gave herself a bolus of 8.5 of Novolog. Nurse will recheck again in 1 hour. [place]

## 2018-03-19 NOTE — Progress Notes (Signed)
PROGRESS NOTE   April Branch  XIP:[id number]    DOB: 1945-08-26    DOA: 03/16/2018  PCP: [name], MD   I have briefly reviewed patients previous medical records in [facility].  Brief Narrative:  73 year old female with PMH of DM 2 on insulin pump, HTN, HLD, hypothyroid, CKD, prior strokes/TIAs with residual left hemiparesis, nonobstructive CAD by cath, COPD, anxiety and depression, GERD, tobacco abuse, chronic pain presented to ED on 03/16/2018 with complaints of left-sided weakness and numbness with inability to walk of 2 days duration, improved by the time she came to ER and cough, wheezing and progressive dyspnea.  In ED code stroke was initiated, CT head without acute findings, became progressively dyspneic and hypoxic, chest x-ray consistent with pulmonary edema, PCCM was consulted and patient was admitted to ICU on BiPAP with improvement.  Neurology consulted.  Clinically improving.   Assessment & Plan:   Principal Problem:   Left-sided weakness Active Problems:   Hypothyroidism   Insulin dependent type 2 diabetes mellitus, controlled (HCC)   DISORDER, TOBACCO USE   Essential hypertension   COPD (chronic obstructive pulmonary disease) (HCC)   CAD (coronary artery disease)   Acute kidney injury superimposed on CKD (HCC)   CKD (chronic kidney disease), stage III (HCC)   Wheezy bronchitis   TIA (transient ischemic attack)   Acute respiratory failure (HCC)   Respiratory failure with hypoxia ([place])   Acute pulmonary edema (HCC)   Goals of care, counseling/discussion   Acute on chronic left-sided weakness/? TIA, history of prior CVA/TIAs CT head: No acute findings MRI brain: Negative for acute infarct. Neurology consultation and stroke team follow-up appreciated.  Discussed with stroke MD and suspects TIA. LDL 64 A1c 7.5 TTE: LVEF 60-65% Carotid Dopplers: Bilateral ICA consistent with 40-59% stenosis.  Vertebral artery flow antegrade. TCD: Suggestive of  intracranial atherosclerosis. Continue aspirin 325 mg daily. PT and OT followed up and recommend home health PT and OT, arranged.  As discussed with son, has family at home to provide 24/7 supervision and assistance. Neurology signed off.  Acute hypoxic respiratory failure: Progressive dyspnea and hypoxia noted on night of admission. Suspect due to pulmonary edema. Transferred to ICU and CCM consulted.  Treated with a dose of IV Lasix and BiPAP. BiPAP transitioned to high flow nasal cannula oxygen and then regular nasal cannula oxygen. PCCM consulted and signed off 1/23. Wean oxygen for saturations of 88-92%.  Reassess home oxygen requirement prior to discharge. Continue Brovana/Pulmicort nebulizations, duo nebs every 6 hourly and albuterol as needed. Reduced IV Solu-Medrol to 40 mg daily-discontinued after 1/24 dose, consider oral prednisone taper from tomorrow. RSV panel negative.  Complete empirically started doxycycline. Chest x-ray 1/23 personally reviewed: Improved variation in right lung with persistent bilateral mixed interstitial and airspace opacities and small right pleural effusion.  Repeated dose of IV Lasix 40 mg x 1 on 1/24. Modified diet as per speech therapy Check desaturation protocol on 1/25, hypoxic in the mid 80s with minimal activity and became quite symptomatic with lightheadedness and dizziness and required 4 L oxygen to bring saturations up. Transitioned to oral prednisone taper 40 mg daily.  Does not appear clinically volume overloaded but will check repeat chest x-ray.  COPD exacerbation Management as noted above. Tobacco cessation counseled. Patient has follow-up appointment with pulmonology on 03/31/2018 at 9 AM. Steroids management as above.  Tobacco abuse Cessation counseled.  Continue nicotine patch.  Acute diastolic CHF Diuretic management as noted above.  Clinically euvolemic.  Follow  chest x-ray.  Essential hypertension:  Controlled.  Holding oral  medications.  Type II DM/insulin pump Follows with Dr. [name], endocrinology. A1c 7.5. Initially treated with Levemir twice daily and SSI while she was n.p.o.  DM coordinator input appreciated.  Transitioned to insulin pump on 1/24.  Hypoglycemic/69 2 hours after breakfast.  As per family, infrequent hypoglycemic episodes at home.  Treat per protocol and monitor closely.  Acute kidney injury on stage III chronic kidney disease Baseline creatinine not exactly known.  Creatinine in November and December of last year with in the 1.2-1.3 range. Presented with creatinine of 1.97.  Creatinine has improved to 1.6.  Follow BMP in a.m.  Hypothyroid Continue prior home dose of Synthroid.  Low a.m. cortisol ?  Adrenal insufficiency.  However remains normotensive.   Chronic normocytic anemia ?  Chronic disease versus chronic kidney disease.  Stable.  Chronic pain/anxiety and depression Currently controlled.  Resumed Cymbalta, Neurontin, and nortriptyline.  UDS positive for opiates.  Acute urinary retention Reportedly had in and out catheterization overnight yielding 900+ mL urine.  Monitor for voiding and check bladder scan as needed.  Seems to have resolved.  Constipation Noted on abdominal x-ray.  Started bowel regimen.  Had BM 2 days ago.   DVT prophylaxis: Subcutaneous heparin Code Status: DNR Family Communication: Discussed in detail with patient's son, updated care and answered questions. Disposition: Admitted to ICU.  Medically improved and transferred to progressive care unit on 1/23.  DC home pending further clinical improvement, hopefully 1/26   Consultants:  Neurology PCCM  Procedures:  BiPAP  Antimicrobials:  Doxycycline   Subjective: Patient denied dyspnea when seen this morning.  However subsequently when she ambulated with RN, became symptomatic with dyspnea and lightheadedness and hypoxic.  ROS: As above, otherwise negative.  Objective:  Vitals:   03/19/18  0742 03/19/18 0855 03/19/18 1240 03/19/18 1345  BP:  (!) 118/55 (!) 102/54   Pulse:  97 (!) 101   Resp:  18    Temp:  98.3 F (36.8 C) 98.4 F (36.9 C)   TempSrc:  Oral Oral   SpO2: 96% 98% 97% 99%  Weight:      Height:        Examination:  General exam: Pleasant elderly female, moderately built and nourished lying comfortably propped up in bed. Respiratory system: Clear to auscultation anteriorly.  Couple of rhonchi posteriorly.  No increased work of breathing. Cardiovascular system: S1 & S2 heard, RRR. No JVD, murmurs, rubs, gallops or clicks. No pedal edema.  Stable. Gastrointestinal system: Abdomen is nondistended, soft and nontender. No organomegaly or masses felt. Normal bowel sounds heard.  Stable Central nervous system: Alert and oriented. No focal neurological deficits.  No change/stable Extremities:?  Left side grade 4+ by 5 power.  Right side grade 5 x 5 per.  No pronator drift. Skin: No rashes, lesions or ulcers Psychiatry: Judgement and insight appear somewhat impaired. Mood & affect appropriate.     Data Reviewed: I have personally reviewed following labs and imaging studies  CBC: Recent Labs  Lab 03/16/18 0617 03/16/18 0753 03/16/18 1801 03/17/18 0507 03/18/18 0658  WBC 11.8*  --   --  17.0* 20.3*  NEUTROABS 8.8*  --   --   --   --   HGB 10.3* 10.9* 10.5* 10.1* 9.9*  HCT 32.4* 32.0* 31.0* 31.8* 31.0*  MCV 84.2  --   --  81.7 82.0  PLT 359  --   --  345 381   Basic  Metabolic Panel: Recent Labs  Lab 03/16/18 0617 03/16/18 0753 03/16/18 1801 03/16/18 1902 03/17/18 0507 03/18/18 0658 03/19/18 0418  NA 134* 136 135  --  136 138 137  K 4.3 4.3 4.9  --  3.5 3.6 3.5  CL 102  --   --   --  104 106 104  CO2 19*  --   --   --  21* 23 23  GLUCOSE 105*  --   --   --  164* 82 224*  BUN 29*  --   --   --  29* 33* 39*  CREATININE 1.97*  --   --   --  1.94* 1.59* 1.68*  CALCIUM 9.0  --   --   --  8.6* 9.2 9.0  MG  --   --   --  1.7  --  1.8  --   PHOS  --    --   --  5.6* 4.1 3.5  --    Liver Function Tests: Recent Labs  Lab 03/16/18 0617 03/17/18 0507  AST 15  --   ALT 14  --   ALKPHOS 125  --   BILITOT 0.6  --   PROT 7.6  --   ALBUMIN 3.4* 2.9*   Coagulation Profile: Recent Labs  Lab 03/16/18 0617  INR 1.04   HbA1C: Recent Labs    03/17/18 0507  HGBA1C 7.5*   CBG: Recent Labs  Lab 03/18/18 2116 03/19/18 0211 03/19/18 0542 03/19/18 1129 03/19/18 1157  GLUCAP 294* 270* 168* 69* 102*    Recent Results (from the past 240 hour(s))  Respiratory Panel by PCR     Status: None   Collection Time: 03/16/18  8:31 PM  Result Value Ref Range Status   Adenovirus NOT DETECTED NOT DETECTED Final   Coronavirus 229E NOT DETECTED NOT DETECTED Final   Coronavirus HKU1 NOT DETECTED NOT DETECTED Final   Coronavirus NL63 NOT DETECTED NOT DETECTED Final   Coronavirus OC43 NOT DETECTED NOT DETECTED Final   Metapneumovirus NOT DETECTED NOT DETECTED Final   Rhinovirus / Enterovirus NOT DETECTED NOT DETECTED Final   Influenza A NOT DETECTED NOT DETECTED Final   Influenza B NOT DETECTED NOT DETECTED Final   Parainfluenza Virus 1 NOT DETECTED NOT DETECTED Final   Parainfluenza Virus 2 NOT DETECTED NOT DETECTED Final   Parainfluenza Virus 3 NOT DETECTED NOT DETECTED Final   Parainfluenza Virus 4 NOT DETECTED NOT DETECTED Final   Respiratory Syncytial Virus NOT DETECTED NOT DETECTED Final   Bordetella pertussis NOT DETECTED NOT DETECTED Final   Chlamydophila pneumoniae NOT DETECTED NOT DETECTED Final   Mycoplasma pneumoniae NOT DETECTED NOT DETECTED Final    Comment: Performed at [facility], [address] [place], [place] [address]  MRSA PCR Screening     Status: None   Collection Time: 03/16/18  8:31 PM  Result Value Ref Range Status   MRSA by PCR NEGATIVE NEGATIVE Final    Comment:        The GeneXpert MRSA Assay (FDA approved for NASAL specimens only), is one component of a comprehensive MRSA colonization surveillance  program. It is not intended to diagnose MRSA infection nor to guide or monitor treatment for MRSA infections. Performed at [facility], [place] [address], [place], [place] [address]   Culture, blood (Routine X 2) w Reflex to ID Panel     Status: None (Preliminary result)   Collection Time: 03/16/18  9:00 PM  Result Value  Ref Range Status   Specimen Description BLOOD RIGHT ANTECUBITAL  Final   Special Requests   Final    BOTTLES DRAWN AEROBIC ONLY Blood Culture results may not be optimal due to an inadequate volume of blood received in culture bottles   Culture   Final    NO GROWTH 3 DAYS Performed at [facility], [place] [address], [place], [place] [address]    Report Status PENDING  Incomplete  Culture, blood (Routine X 2) w Reflex to ID Panel     Status: None (Preliminary result)   Collection Time: 03/16/18  9:09 PM  Result Value Ref Range Status   Specimen Description BLOOD RIGHT ANTECUBITAL  Final   Special Requests   Final    BOTTLES DRAWN AEROBIC ONLY Blood Culture results may not be optimal due to an inadequate volume of blood received in culture bottles   Culture   Final    NO GROWTH 3 DAYS Performed at [facility], [place] [address], [place], [place] [address]    Report Status PENDING  Incomplete         Radiology Studies: No results found.      Scheduled Meds: . arformoterol  15 mcg Nebulization BID  . aspirin EC  325 mg Oral Daily  . budesonide (PULMICORT) nebulizer solution  0.5 mg Nebulization BID  . chlorhexidine  15 mL Mouth Rinse BID  . cilostazol  100 mg Oral BID AC  . doxycycline  100 mg Oral Q12H  . DULoxetine  60 mg Oral Daily  . ferrous sulfate  325 mg Oral Q breakfast  . gabapentin  300 mg Oral BID  . heparin injection (subcutaneous)  5,000 Units Subcutaneous Q8H  . insulin pump   Subcutaneous TID AC, HS, 0200  . ipratropium-albuterol  3 mL Nebulization TID  . levothyroxine  125 mcg Oral Q0600  . mouth rinse  15 mL Mouth Rinse  q12n4p  . nicotine  14 mg Transdermal Daily  . nortriptyline  75 mg Oral QHS  . pantoprazole  20 mg Oral Daily  . polyethylene glycol  17 g Oral Daily  . predniSONE  30 mg Oral Q breakfast  . QUEtiapine  100 mg Oral QHS  . senna  2 tablet Oral Daily   Continuous Infusions:    LOS: 3 days     Vernell Leep, MD, FACP, [facility]. [name]  To contact the attending provider between 7A-7P or the covering provider during after hours 7P-7A, please log into the web site [email or web address] and access using universal [place] password for that web site. If you do not have the password, please call the hospital operator.  03/19/2018, 3:02 PM

## 2018-03-19 NOTE — Progress Notes (Addendum)
Pt CBG at 302. Dr. Algis Liming notified.Dr Algis Liming did not want to draw a serum ketone level. Nurse will continue to monitor. [place]

## 2018-03-19 NOTE — Progress Notes (Signed)
Occupational Therapy Treatment Patient Details Name: April Branch MRN: [id number] DOB: 1945-12-08 Today's Date: 03/19/2018    History of present illness  April Branch is a 73 y.o. female with medical history significant for type 2 diabetes on insulin pump, hypothyroidism, hypertension, chronic kidney disease stage III, peripheral vascular disease and coronary artery disease, active smoker and chronic pain problems, chronic left hemiparesis with history of a stroke presents to the emergency room with vague complaints including total weakness of the left side of the body happened 2 days ago, CP, cough and shortness of breath.  CT and motion degraded MRI showed no acute event.   OT comments  Patient progressing slowly.  Using RW for mobility, and supplemental oxygen via [place] on 2L throughout session given cueing throughout for PLB with no noted SOB.  Reviewed energy conservation techniques and provided handout, verbalized understanding of recommendations.  Supervision for grooming standing at sink, min guard for simulated tub transfers, and assistance throughout session for oxygen line mgmt.  Continue to recommend [place].  Will follow.   Follow Up Recommendations  Home health OT;Supervision/Assistance - 24 hour    Equipment Recommendations  3 in 1 bedside commode    Recommendations for Other Services      Precautions / Restrictions Precautions Precautions: Fall Precaution Comments: monitor O2 sats Restrictions Weight Bearing Restrictions: No       Mobility Bed Mobility               General bed mobility comments: seated EOB upon entry   Transfers Overall transfer level: Needs assistance Equipment used: Rolling walker (2 wheeled) Transfers: Sit to/from Stand Sit to Stand: Modified independent (Device/Increase time);Min guard         General transfer comment: modified independent for basic transfers, min guard for tub transfers     Balance Overall balance  assessment: Needs assistance Sitting-balance support: No upper extremity supported;Single extremity supported Sitting balance-Leahy Scale: Good     Standing balance support: Single extremity supported;No upper extremity supported;Bilateral upper extremity supported Standing balance-Leahy Scale: Fair Standing balance comment: reliant on BU E support                            ADL either performed or assessed with clinical judgement   ADL Overall ADL's : Needs assistance/impaired     Grooming: Wash/dry hands;Oral care;Supervision/safety;Standing                   Toilet Transfer: Supervision/safety;Ambulation;RW Toilet Transfer Details (indicate cue type and reason): simulated in room     Tub/ Shower Transfer: Min guard;Tub transfer;Ambulation;3 in 1;Cueing for safety Tub/Shower Transfer Details (indicate cue type and reason): reviewed use of 3:1 for tub transfer with min guard for safety and RW Functional mobility during ADLs: Supervision/safety;Rolling walker General ADL Comments: reviewed handout on energy conservation techniques and utilized during self care tasks      Vision       Perception     Praxis      Cognition Arousal/Alertness: Awake/alert Behavior During Therapy: WFL for tasks assessed/performed Overall Cognitive Status: Within Functional Limits for tasks assessed                                          Exercises     Shoulder Instructions       General Comments on  2L throughout session with cueing for PLB     Pertinent Vitals/ Pain       Pain Assessment: Faces Faces Pain Scale: No hurt  Home Living                                          Prior Functioning/Environment              Frequency  Min 2X/week        Progress Toward Goals  OT Goals(current goals can now be found in the care plan section)  Progress towards OT goals: Progressing toward goals  Acute Rehab OT  Goals Patient Stated Goal: want to go home OT Goal Formulation: With patient Time For Goal Achievement: 04/01/18 Potential to Achieve Goals: Good  Plan Discharge plan remains appropriate;Frequency remains appropriate    Co-evaluation                 AM-PAC OT "6 Clicks" Daily Activity     Outcome Measure   Help from another person eating meals?: None Help from another person taking care of personal grooming?: None Help from another person toileting, which includes using toliet, bedpan, or urinal?: None Help from another person bathing (including washing, rinsing, drying)?: None Help from another person to put on and taking off regular upper body clothing?: None Help from another person to put on and taking off regular lower body clothing?: None 6 Click Score: 24    End of Session Equipment Utilized During Treatment: Rolling walker;Oxygen  OT Visit Diagnosis: Muscle weakness (generalized) (M62.81);Unsteadiness on feet (R26.81)   Activity Tolerance Patient tolerated treatment well   Patient Left with call bell/phone within reach;Other (comment)(seated EOB )   Nurse Communication Mobility status        Time: 1165-7903 OT Time Calculation (min): 23 min  Charges: OT General Charges $OT Visit: 1 Visit OT Treatments $Self Care/Home Management : 23-37 mins  Delight Stare, [place] Pager [phone] Office [phone]    Delight Stare 03/19/2018, 1:27 PM

## 2018-03-19 NOTE — Progress Notes (Signed)
Patient CBG was 69 at 1130. No bolus give through patient's insulin pump. Nurse gave patient orange juice. CBG rechecked at 1155 and it was 102. Nurse will continue to monitor. [place]

## 2018-03-19 NOTE — Progress Notes (Signed)
Patient CBG 294, patient gave herself 2.2 units via insulin pump. Patient refused to give herself correction bolus per MD orders stating " it will drop my sugar".  Jeannette Corpus  NP notified. No new orders received.

## 2018-03-19 NOTE — Progress Notes (Signed)
Results for MICHAL, STRZELECKI (MRN [id number]) as of 03/19/2018 09:38  Ref. Range 03/18/2018 08:28 03/18/2018 11:24 03/18/2018 17:49 03/18/2018 18:54 03/18/2018 20:25 03/18/2018 21:16  Glucose-Capillary Latest Ref Range: 70 - 99 mg/dL 92 138 (H)  2 units NOVOLOG +  15 units LEVEMIR at 10am 434 (H)  11.4 units given by pt per Insulin Pump 397 (H) 354 (H) 294 (H)  2.2 units given by pt per Insulin Pump   Results for RUHANI, UMLAND (MRN [id number]) as of 03/19/2018 09:38  Ref. Range 03/19/2018 02:11 03/19/2018 05:42  Glucose-Capillary Latest Ref Range: 70 - 99 mg/dL 270 (H)  5.9 units given by pt per Insulin Pump 168 (H)  3.75 units given by pt per Insulin Pump    Home DM Meds: Insulin Pump  Current Orders: Insulin Pump      Note that patient resumed her Insulin Pump yesterday afternoon around 5pm.  CBG at 5:49pm yesterday was 434 mg/dl--Pt gave self 11.4 units with her Insulin Pump.  Serum ketones were drawn later that evening and they were WNL.  BMET this AM shows CO2 and Anion Gap WNL as well.  Suspect pt may have eaten prior to CBG being taken yesterday afternoon?  CBG down to 168 mg/dl by 6am today.    --Will follow patient during hospitalization--  Wyn Quaker RN, MSN, CDE Diabetes Coordinator Inpatient Glycemic Control Team Team Pager: [phone] (8a-5p)

## 2018-03-19 NOTE — Progress Notes (Signed)
SATURATION QUALIFICATIONS: (This note is used to comply with regulatory documentation for home oxygen)  Patient Saturations on Room Air at Rest = 94%  Patient Saturations on Room Air while Ambulating = 85%  Patient Saturations on 4 Liters of oxygen while Ambulating = 92%  Please briefly explain why patient needs home oxygen:  Patient's O2 saturation dropped to 85% when ambulating a short distance in the hallway without oxygen. Patient began feeling lightheaded and dizzy after walking. Patient was placed on 4L of o2 to maintain o2 saturation above 88%

## 2018-03-20 LAB — BASIC METABOLIC PANEL
Anion gap: 16 — ABNORMAL HIGH (ref 5–15)
Anion gap: 12 (ref 5–15)
BUN: 35 mg/dL — ABNORMAL HIGH (ref 8–23)
BUN: 31 mg/dL — ABNORMAL HIGH (ref 8–23)
CALCIUM: 8.9 mg/dL (ref 8.9–10.3)
CHLORIDE: 102 mmol/L (ref 98–111)
CO2: 20 mmol/L — AB (ref 22–32)
CO2: 22 mmol/L (ref 22–32)
Calcium: 9.4 mg/dL (ref 8.9–10.3)
Chloride: 105 mmol/L (ref 98–111)
Creatinine, Ser: 1.67 mg/dL — ABNORMAL HIGH (ref 0.44–1.00)
Creatinine, Ser: 1.54 mg/dL — ABNORMAL HIGH (ref 0.44–1.00)
GFR calc Af Amer: 36 mL/min — ABNORMAL LOW (ref >60)
GFR calc Af Amer: 39 mL/min — ABNORMAL LOW (ref >60)
GFR calc non Af Amer: 31 mL/min — ABNORMAL LOW (ref >60)
GFR calc non Af Amer: 34 mL/min — ABNORMAL LOW (ref >60)
Glucose, Bld: 164 mg/dL — ABNORMAL HIGH (ref 70–99)
Glucose, Bld: 246 mg/dL — ABNORMAL HIGH (ref 70–99)
Potassium: 3.3 mmol/L — ABNORMAL LOW (ref 3.5–5.1)
Potassium: 4.6 mmol/L (ref 3.5–5.1)
Sodium: 138 mmol/L (ref 135–145)
Sodium: 139 mmol/L (ref 135–145)

## 2018-03-20 LAB — GLUCOSE, CAPILLARY
Glucose-Capillary: 197 mg/dL — ABNORMAL HIGH (ref 70–99)
Glucose-Capillary: 130 mg/dL — ABNORMAL HIGH (ref 70–99)
Glucose-Capillary: 116 mg/dL — ABNORMAL HIGH (ref 70–99)

## 2018-03-20 MED ORDER — PREDNISONE 10 MG PO TABS: ORAL_TABLET | ORAL | 0 refills | Status: DC

## 2018-03-20 MED ORDER — ASPIRIN 325 MG PO TBEC: 325.0000 mg | DELAYED_RELEASE_TABLET | Freq: Every day | ORAL | 0 refills | Status: DC

## 2018-03-20 MED ORDER — POTASSIUM CHLORIDE CRYS ER 20 MEQ PO TBCR
40.0000 meq | EXTENDED_RELEASE_TABLET | Freq: Once | ORAL | Status: AC
  Administered 2018-03-20: 40 meq via ORAL
  Filled 2018-03-20: qty 2

## 2018-03-20 MED ORDER — NICOTINE 14 MG/24HR TD PT24: 14.0000 mg | MEDICATED_PATCH | Freq: Every day | TRANSDERMAL | 0 refills | Status: DC

## 2018-03-20 MED ORDER — SENNA 8.6 MG PO TABS: 2.0000 | ORAL_TABLET | Freq: Every day | ORAL | 0 refills | Status: DC

## 2018-03-20 MED ORDER — FUROSEMIDE 20 MG PO TABS: 20.0000 mg | ORAL_TABLET | Freq: Every day | ORAL | 0 refills | Status: DC

## 2018-03-20 MED ORDER — POLYETHYLENE GLYCOL 3350 17 G PO PACK: 17.0000 g | PACK | Freq: Every day | ORAL | 0 refills | Status: DC

## 2018-03-20 NOTE — Progress Notes (Signed)
SATURATION QUALIFICATIONS: (This note is used to comply with regulatory documentation for home oxygen)  Patient Saturations on Room Air at Rest = 97%  Patient Saturations on Room Air while Ambulating = 84%  Patient Saturations on 1 Liters of oxygen while Ambulating = 92%  Please briefly explain why patient needs home oxygen:  While patient ambulated, o2 dropped to 84% briefly but came up to 88% without oxygen. Patient was placed on 1L o2 and was able to maintain saturation above 88%. Patient did not complain of dizziness or weakness.

## 2018-03-20 NOTE — Discharge Instructions (Signed)

## 2018-03-20 NOTE — Progress Notes (Signed)
Inpatient Diabetes Program Recommendations  AACE/ADA: New Consensus Statement on Inpatient Glycemic Control (2015)  Target Ranges:  Prepandial:   less than 140 mg/dL      Peak postprandial:   less than 180 mg/dL (1-2 hours)      Critically ill patients:  140 - 180 mg/dL   Results for April Branch, April Branch (MRN [id number]) as of 03/20/2018 09:43  Ref. Range 03/19/2018 05:42 03/19/2018 11:29 03/19/2018 11:57 03/19/2018 16:17 03/19/2018 17:33 03/19/2018 21:03  Glucose-Capillary Latest Ref Range: 70 - 99 mg/dL 168 (H)  3.75 units given by pt per Insulin Pump 69 (L) 102 (H) 314 (H)  8.5 units given by pt per Insulin Pump 302 (H) 301 (H)   Results for April Branch, April Branch (MRN [id number]) as of 03/20/2018 09:43  Ref. Range 03/20/2018 02:21 03/20/2018 06:22  Glucose-Capillary Latest Ref Range: 70 - 99 mg/dL 197 (H) 130 (H)    Home DM Meds: Insulin Pump  Current Orders: Insulin Pump      Note patient had mild Hypoglycemic event yesterday at 11:30am.  Treated with juice per Hypoglycemia Standing orders.  It does not appear that patient gave herself any insulin around lunch.  She also received 30 mg Prednisone in the morning as well.  The combination of not taking insulin at lunch and the Prednisone likely led to elevated CBGs in the evening.  CBG down to 130 mg/dl this AM.     --Will follow patient during hospitalization--  Wyn Quaker RN, MSN, CDE Diabetes Coordinator Inpatient Glycemic Control Team Team Pager: [phone] (8a-5p)

## 2018-03-20 NOTE — Discharge Summary (Signed)
Physician Discharge Summary  April Branch YTK:[id number] DOB: 1945-09-16  PCP: [name], MD  Admit date: 03/16/2018 Discharge date: 03/20/2018  Recommendations for Outpatient Follow-up:  1. Dr. [name], PCP in 5 days with repeat labs (CBC & BMP). 2. Geraldo Pitter, NP/pulmonology on 03/31/2018 at 9 AM. 3. Dr. Elayne Snare, Endocrinology in 2 weeks. 4. Recommend repeating urine microscopy in a couple of weeks and if microscopic hematuria persists then consider further evaluation.  Home Health: PT & OT Equipment/Devices: 3 n 1, oxygen via nasal cannula at 1 L/min continuously.  Discharge Condition: Improved and stable. CODE STATUS: DNR Diet recommendation: Heart healthy & diabetic diet.  Discharge Diagnoses:  Principal Problem:   Left-sided weakness Active Problems:   Hypothyroidism   Insulin dependent type 2 diabetes mellitus, controlled (HCC)   DISORDER, TOBACCO USE   Essential hypertension   COPD (chronic obstructive pulmonary disease) (HCC)   CAD (coronary artery disease)   Acute kidney injury superimposed on CKD (HCC)   CKD (chronic kidney disease), stage III (HCC)   Wheezy bronchitis   TIA (transient ischemic attack)   Acute respiratory failure (HCC)   Respiratory failure with hypoxia (HCC)   Acute pulmonary edema (HCC)   Goals of care, counseling/discussion   Brief Summary: 73 year old female with PMH of DM 2 on insulin pump, HTN, HLD, hypothyroid, CKD, prior strokes/TIAs with residual left hemiparesis, nonobstructive CAD by cath, COPD, anxiety and depression, GERD, tobacco abuse, chronic pain presented to ED on 03/16/2018 with complaints of left-sided weakness and numbness with inability to walk of 2 days duration, improved by the time she came to ER and cough, wheezing and progressive dyspnea.  In ED code stroke was initiated, CT head without acute findings, became progressively dyspneic and hypoxic, chest x-ray consistent with pulmonary edema, PCCM was  consulted and patient was admitted to ICU on BiPAP with improvement.  Neurology consulted.    Assessment & Plan:   Acute on chronic left-sided weakness/? TIA, history of prior CVA/TIAs CT head: No acute findings MRI brain: Negative for acute infarct. Neurology consultation and stroke team follow-up appreciated.  Discussed with stroke MD and suspects TIA. LDL 64 A1c 7.5 TTE: LVEF 60-65% Carotid Dopplers: Bilateral ICA consistent with 40-59% stenosis.  Vertebral artery flow antegrade. TCD: Suggestive of intracranial atherosclerosis. Continue aspirin 325 mg daily. PT and OT followed up and recommend home health PT and OT, arranged.  As discussed with son, has family at home to provide 24/7 supervision and assistance. Neurology signed off.  Acute hypoxic respiratory failure: Progressive dyspnea and hypoxia noted on night of admission. Suspect multifactorial due to pulmonary edema, COPD exacerbation and?  Underlying ILD. Transferred to ICU and CCM consulted.  Treated with a dose of IV Lasix and BiPAP. BiPAP transitioned to high flow nasal cannula oxygen and then regular nasal cannula oxygen. PCCM consulted and signed off 1/23. Treated with Brovana/Pulmicort nebulizations, duo nebs every 6 hourly and albuterol as needed. Initially treated with IV Solu-Medrol, then transition to oral prednisone and plan quick taper. RSV panel negative.  Completed approximately 5 days of oral doxycycline. Chest x-ray 1/23 personally reviewed: Improved aeration in right lung with persistent bilateral mixed interstitial and airspace opacities and small right pleural effusion.  Repeated dose of IV Lasix 40 mg x 1 on 1/24. Modified diet as per speech therapy Yesterday she was to symptomatic of dyspnea with activity and hypoxic requiring 4 L/min oxygen.  Chest x-ray repeated and showed diffuse mixed interstitial and airspace disease without significant change.  Repeated a dose of IV Lasix 40 mg x 1. She has  clinically improved.  Dyspnea resolved.  Today only requires 1 L/min oxygen with activity.  Home oxygen arranged.  COPD exacerbation Management as noted above. Tobacco cessation counseled. Patient has follow-up appointment with pulmonology on 03/31/2018 at 9 AM. Steroids management as above.  Tobacco abuse Cessation counseled.  Continue nicotine patch.  Acute diastolic CHF Diuretic management as noted above.    Clinically euvolemic.  Given her response to Lasix yesterday, discharging on low-dose Lasix 20 mg daily for about 10 days.  This can be closely assessed during outpatient follow-up with PCP and pulmonology and determine if diuretics need to be continued.  Essential hypertension:  Due to?  TIA, antihypertensives were held to allow permissive hypertension.  Normotensive.  Discontinued amlodipine.  Continue Toprol-XL.  Type II DM/insulin pump Follows with Dr. [name], endocrinology. A1c 7.5. Initially treated with Levemir twice daily and SSI while she was n.p.o.  DM coordinator input appreciated.  Transitioned to insulin pump on 1/24.  Hypoglycemic/69 mg/dl 2 hours after breakfast.  As per family, infrequent hypoglycemic episodes at home.  CBGs reasonably controlled today.  This should improve with weaning off of prednisone.  Close outpatient follow-up with endocrinology.  Acute kidney injury on stage III chronic kidney disease Baseline creatinine not exactly known.  Creatinine in November and December of last year with in the 1.2-1.3 range. Presented with creatinine of 1.97.  Creatinine has improved to 1.5.  Follow BMP closely as outpatient.  Hypothyroid Continue prior home dose of Synthroid.  TSH is low 0.047 but clinically euthyroid.  Close outpatient follow-up with endocrinology.  May consider repeating TSH in 4 weeks versus marginally reducing Synthroid dose.  Low a.m. cortisol ?  Adrenal insufficiency.  However remains normotensive.  Continue prior home dose of Florinef  and outpatient follow-up with endocrinology to decide if this needs to continue or can be stopped.  Chronic normocytic anemia ?  Chronic disease versus chronic kidney disease.  Stable.  Chronic pain/anxiety and depression Currently controlled. Continue prior home medicines.  UDS positive for opiates.  Acute urinary retention Reportedly had in and out catheterization overnight yielding 900+ mL urine while in ICU.  Resolved.  Continue Detrol LA.  Constipation Noted on abdominal x-ray.    Initiated bowel regimen.  Had BM.  Microscopic hematuria Asymptomatic.  Unclear etiology.  Recommend repeating urine microscopy in a couple of weeks and if persists then consider further evaluation.    Consultants:  Neurology PCCM  Procedures:  BiPAP  Discharge Instructions  Discharge Instructions    (HEART FAILURE PATIENTS) Call MD:  Anytime you have any of the following symptoms: 1) 3 pound weight gain in 24 hours or 5 pounds in 1 week 2) shortness of breath, with or without a dry hacking cough 3) swelling in the hands, feet or stomach 4) if you have to sleep on extra pillows at night in order to breathe.   Complete by:  As directed    Call MD for:   Complete by:  As directed    Recurrent strokelike symptoms.   Call MD for:  difficulty breathing, headache or visual disturbances   Complete by:  As directed    Call MD for:  extreme fatigue   Complete by:  As directed    Call MD for:  persistant dizziness or light-headedness   Complete by:  As directed    Call MD for:  persistant nausea and vomiting   Complete by:  As directed    Call MD for:  severe uncontrolled pain   Complete by:  As directed    Call MD for:  temperature >100.4   Complete by:  As directed    Diet - low sodium heart healthy   Complete by:  As directed    Diet Carb Modified   Complete by:  As directed    Discharge instructions   Complete by:  As directed    Diet consistency as per speech therapy  recommendations as follows:  Diet recommendations: Dysphagia 3 (mechanical soft);Thin liquid Liquids provided via: Cup;Straw Medication Administration: Whole meds with puree Supervision: Patient able to self feed Compensations: Slow rate;Small sips/bites Postural Changes and/or Swallow Maneuvers: Seated upright 90 degrees      Increase activity slowly   Complete by:  As directed        Medication List    STOP taking these medications   amLODipine 10 MG tablet Commonly known as:  NORVASC     TAKE these medications   ACCU-CHEK FASTCLIX LANCETS Misc Use 4 per day to check blood sugar dx code E11.9   albuterol 108 (90 Base) MCG/ACT inhaler Commonly known as:  PROVENTIL HFA;VENTOLIN HFA Inhale 1-2 puffs into the lungs every 6 (six) hours as needed for wheezing or shortness of breath.   aspirin 325 MG EC tablet Take 1 tablet (325 mg total) by mouth daily. Start taking on:  March 21, 2018   cilostazol 100 MG tablet Commonly known as:  PLETAL TAKE 1 TABLET BY MOUTH TWICE A DAY BEFORE MEALS What changed:  See the new instructions.   DULoxetine 60 MG capsule Commonly known as:  CYMBALTA Take 1 capsule (60 mg total) by mouth daily.   ferrous sulfate 325 (65 FE) MG tablet Take 325 mg by mouth daily with breakfast.   fludrocortisone 0.1 MG tablet Commonly known as:  FLORINEF 1 tablet 3 times a week What changed:    how much to take  how to take this  when to take this  additional instructions   furosemide 20 MG tablet Commonly known as:  LASIX Take 1 tablet (20 mg total) by mouth daily.   gabapentin 300 MG capsule Commonly known as:  NEURONTIN Take 1 capsule (300 mg total) by mouth 2 (two) times daily.   glucose blood test strip Commonly known as:  CONTOUR NEXT TEST Use to test blood sugar 3 times daily E11.9   HYDROcodone-acetaminophen 10-325 MG tablet Commonly known as:  NORCO Take 1 tablet by mouth 3 (three) times daily as needed (for pain). Follow up  with PCP if refills needed   hydrOXYzine 25 MG tablet Commonly known as:  ATARAX/VISTARIL Take 1 tablet (25 mg total) by mouth every 6 (six) hours.   insulin lispro 100 UNIT/ML injection Commonly known as:  HUMALOG Use 70 units daily in insulin pump   Insulin Pen Needle 32G X 4 MM Misc Commonly known as:  BD PEN NEEDLE NANO U/F Use to inject insulin   lansoprazole 30 MG capsule Commonly known as:  PREVACID Take 1 capsule (30 mg total) by mouth daily.   levothyroxine 150 MCG tablet Commonly known as:  SYNTHROID, LEVOTHROID Take 1 tablet (150 mcg total) by mouth daily.   metoprolol succinate 25 MG 24 hr tablet Commonly known as:  TOPROL-XL TAKE 1 TABLET BY MOUTH EVERY DAY   nicotine 14 mg/24hr patch Commonly known as:  NICODERM CQ - dosed in mg/24 hours Place 1 patch (14 mg total) onto  the skin daily. Start taking on:  March 21, 2018   nortriptyline 75 MG capsule Commonly known as:  PAMELOR Take 75 mg by mouth at bedtime.   polyethylene glycol packet Commonly known as:  MIRALAX / GLYCOLAX Take 17 g by mouth daily. Start taking on:  March 21, 2018   potassium chloride 10 MEQ tablet Commonly known as:  KLOR-CON 10 Take 1 tablet (10 mEq total) by mouth 2 (two) times daily.   predniSONE 10 MG tablet Commonly known as:  DELTASONE Take 3 tabs daily for 2 days, then 2 tabs daily for 3 days, then 1 tab daily for 3 days, then stop. Start taking on:  March 21, 2018   QUEtiapine 100 MG tablet Commonly known as:  SEROQUEL Take 100 mg by mouth at bedtime.   senna 8.6 MG Tabs tablet Commonly known as:  SENOKOT Take 2 tablets (17.2 mg total) by mouth daily. Start taking on:  March 21, 2018   tolterodine 4 MG 24 hr capsule Commonly known as:  DETROL LA Take 4 mg by mouth daily.   triamcinolone cream 0.1 % Commonly known as:  KENALOG Apply 1 application topically 2 (two) times daily.   Vitamin D (Ergocalciferol) 1.25 MG (50000 UT) Caps capsule Commonly known  as:  DRISDOL Take 50,000 Units by mouth once a week.      Follow-up Information    Martyn Ehrich, NP Follow up on 03/31/2018.   Specialty:  Pulmonary Disease Why:  Your appointment is at [facility].  Please call our office if you need to reschedule.  Contact information: [address] [place] [address] [phone]        [facility] - Dme Follow up.   Why:  Oxygen and 3/1 to be delivered to room prior to DC. they will contact you to schedule your home health for the first visit. Contact information: [address] [place] [address] [phone]        [name], MD. Schedule an appointment as soon as possible for a visit in 5 day(s).   Specialty:  Internal Medicine Why:  To be seen with repeat labs (CBC & BMP). Contact information: [address] [place] [address] [phone]        Josue Hector, MD .   Specialty:  Cardiology Contact information: [phone] [address] [place] [address] [phone]        Elayne Snare, MD. Schedule an appointment as soon as possible for a visit in 2 week(s).   Specialty:  Endocrinology Contact information: [place] [address] [phone]          No Known Allergies    Procedures/Studies: Dg Chest 1 View  Result Date: 03/16/2018 CLINICAL DATA:  Dyspnea with agitation EXAM: CHEST  1 VIEW COMPARISON:  03/16/2018 at 0756 hours FINDINGS: Stable cardiomegaly. Nonaneurysmal atherosclerosis of the aorta. Interval increase in interstitial edema superimposed on coarsened interstitial markings. No pneumothorax or significant pleural effusions given the frontal projection. No acute osseous abnormality. IMPRESSION: Interval increase in interstitial edema superimposed on coarsened interstitial markings. Stable cardiomegaly with aortic atherosclerosis. Electronically Signed   By: Ashley Royalty M.D.   On: 03/16/2018 18:09   Dg Abdomen 1  View  Result Date: 03/16/2018 CLINICAL DATA:  Poor historian.  Evaluate for metallic foreign body. EXAM: ABDOMEN - 1 VIEW COMPARISON:  None. FINDINGS: No definitive ingested radiopaque foreign body though there is exclusion  of the lateral-most aspect the right mid hemiabdomen. Moderate to large colonic stool burden without evidence of enteric obstruction. Nondiagnostic evaluation for pneumoperitoneum secondary to supine positioning and exclusion of the lower thorax. No pneumatosis or portal venous gas. Post cholecystectomy. Punctate phleboliths overlies the right hemipelvis. Degenerative change the lower thoracic and lumbar spine. Atherosclerotic plaque overlies the abdominal aorta and pelvic vasculature. IMPRESSION: 1. No ingested radiopaque foreign body. 2. Moderate to large colonic stool burden without evidence of enteric obstruction. Electronically Signed   By: Sandi Mariscal M.D.   On: 03/16/2018 10:21   Ct Head Wo Contrast  Result Date: 03/16/2018 CLINICAL DATA:  Possible stroke. Ataxia with left-sided arm and leg weakness. EXAM: CT HEAD WITHOUT CONTRAST TECHNIQUE: Contiguous axial images were obtained from the base of the skull through the vertex without intravenous contrast. COMPARISON:  04/22/2017 FINDINGS: Brain: No evidence of acute infarction, hemorrhage, hydrocephalus, extra-axial collection or mass lesion/mass effect. Mild chronic small vessel ischemic type change in the cerebral white matter. Vascular: Atherosclerotic calcification.  No hyperdense vessel Skull: No acute finding Sinuses/Orbits: Endoscopic sinus surgery on the right. There is volume loss at the right ethmoids with relatively expanded right orbit. Bilateral cataract resection. IMPRESSION: No acute finding. Electronically Signed   By: Monte Fantasia M.D.   On: 03/16/2018 07:16   Mr Brain Wo Contrast  Result Date: 03/16/2018 CLINICAL DATA:  73 year old female with ataxia, left extremity weakness. Possible stroke. EXAM: MRI HEAD  WITHOUT CONTRAST TECHNIQUE: Multiplanar, multiecho pulse sequences of the brain and surrounding structures were obtained without intravenous contrast. COMPARISON:  Head CT without contrast 0649 hours today. Brain MRI 04/22/2017 and earlier. FINDINGS: The examination had to be discontinued prior to completion by patient request. Diffusion and motion degraded sagittal T1 weighted imaging only was obtained. No restricted diffusion or evidence of acute infarction. No midline shift, mass effect, or evidence of intracranial mass lesion. No ventriculomegaly. Patchy and confluent facilitated diffusion in the cerebral white matter corresponds to T2 and FLAIR hyperintensity in 2019. Sagittal T1 weighted imaging appears grossly stable and negative. IMPRESSION: 1. Negative for acute infarct. 2. The examination had to be discontinued prior to completion by patient request. Diffusion and motion degraded sagittal T1 weighted imaging only was obtained. Electronically Signed   By: Genevie Ann M.D.   On: 03/16/2018 14:08   Dg Chest Port 1 View  Result Date: 03/19/2018 CLINICAL DATA:  Hypoxia. Congestive heart failure. COPD. EXAM: PORTABLE CHEST 1 VIEW COMPARISON:  03/17/2018 FINDINGS: Stable mild cardiomegaly. Diffuse mixed interstitial and airspace disease shows no significant change. Small right pleural effusion also stable. IMPRESSION: No significant change compared with prior exam. Electronically Signed   By: Earle Gell M.D.   On: 03/19/2018 16:07   Dg Chest Port 1 View  Result Date: 03/17/2018 CLINICAL DATA:  73 year old female with a history of acute congestive heart failure EXAM: PORTABLE CHEST 1 VIEW COMPARISON:  03/16/2018, 03/16/2018, 12/10/2017 FINDINGS: Cardiomediastinal silhouette unchanged with cardiomegaly. Similar appearance of mixed interstitial and airspace opacities of the bilateral lungs with improved aeration on the right. Superimposed interlobular septal thickening. No pneumothorax. Blunting of the right  costophrenic angle. IMPRESSION: Improved aeration of the right lung, with persisting bilateral mixed interstitial and airspace opacity. Small right pleural effusion. Electronically Signed   By: Corrie Mckusick D.O.   On: 03/17/2018 09:24   Dg Chest Portable 1 View  Result Date: 03/16/2018 CLINICAL DATA:  Shortness of breath and hypoxia EXAM: PORTABLE CHEST 1 VIEW COMPARISON:  12/10/2017 FINDINGS: Cardiopericardial  enlargement, increased from prior. Diffuse interstitial coarsening that could be congestive or bronchitic. There is a linear density in the right suprahilar lung that is scarring when compared to priors. No effusion or pneumothorax. IMPRESSION: Increased cardiopericardial size with generalized congestive or bronchitic interstitial coarsening. Electronically Signed   By: Monte Fantasia M.D.   On: 03/16/2018 08:14   Vas US Carotid (at [facility] Only)  Result Date: 03/18/2018 Carotid Arterial Duplex Study Indications: TIA. Performing Technologist: Abram Sander RVS  Examination Guidelines: A complete evaluation includes B-mode imaging, spectral Doppler, color Doppler, and power Doppler as needed of all accessible portions of each vessel. Bilateral testing is considered an integral part of a complete examination. Limited examinations for reoccurring indications may be performed as noted.  Right Carotid Findings: +----------+--------+--------+--------+------------+--------+           PSV cm/sEDV cm/sStenosisDescribe    Comments +----------+--------+--------+--------+------------+--------+ CCA Prox  114                     heterogenous         +----------+--------+--------+--------+------------+--------+ CCA Distal143     13              heterogenous         +----------+--------+--------+--------+------------+--------+ ICA Prox  164     40      40-59%  heterogenous         +----------+--------+--------+--------+------------+--------+ ICA Distal130     25                                    +----------+--------+--------+--------+------------+--------+ ECA       160                                          +----------+--------+--------+--------+------------+--------+ +----------+--------+-------+--------+-------------------+           PSV cm/sEDV cmsDescribeArm Pressure (mmHG) +----------+--------+-------+--------+-------------------+ Subclavian122                                        +----------+--------+-------+--------+-------------------+ +---------+--------+--+--------+--+---------+ VertebralPSV cm/s50EDV cm/s15Antegrade +---------+--------+--+--------+--+---------+  Left Carotid Findings: +----------+--------+--------+--------+------------+--------+           PSV cm/sEDV cm/sStenosisDescribe    Comments +----------+--------+--------+--------+------------+--------+ CCA Prox  108     14              heterogenous         +----------+--------+--------+--------+------------+--------+ CCA [id number]     14              heterogenous         +----------+--------+--------+--------+------------+--------+ ICA Prox  220     49      40-59%  heterogenous         +----------+--------+--------+--------+------------+--------+ ICA Distal101     20                                   +----------+--------+--------+--------+------------+--------+ ECA       195     6                                    +----------+--------+--------+--------+------------+--------+ +----------+--------+--------+--------+-------------------+  SubclavianPSV cm/sEDV cm/sDescribeArm Pressure (mmHG) +----------+--------+--------+--------+-------------------+           228                                         +----------+--------+--------+--------+-------------------+ +---------+--------+--+--------+--+---------+ VertebralPSV cm/s58EDV cm/s10Antegrade +---------+--------+--+--------+--+---------+  Summary: Right Carotid: Velocities in the  right ICA are consistent with a 40-59%                stenosis. Left Carotid: Velocities in the left ICA are consistent with a 40-59% stenosis. Vertebrals: Bilateral vertebral arteries demonstrate antegrade flow. *See table(s) above for measurements and observations.  Electronically signed by Antony Contras MD on 03/18/2018 at 12:25:24 PM.    Final    Vas [place] Transcranial Doppler  Result Date: 03/17/2018  Transcranial Doppler Indications: Stroke. Performing Technologist: Abram Sander RVS  Examination Guidelines: A complete evaluation includes B-mode imaging, spectral Doppler, color Doppler, and power Doppler as needed of all accessible portions of each vessel. Bilateral testing is considered an integral part of a complete examination. Limited examinations for reoccurring indications may be performed as noted.  +----------+-------------+----------+-----------+-------+ RIGHT TCD Right VM (cm)Depth (cm)PulsatilityComment +----------+-------------+----------+-----------+-------+ MCA           54.00                 1.52            +----------+-------------+----------+-----------+-------+ ACA           25.00                 1.87            +----------+-------------+----------+-----------+-------+ Term ICA      21.00                 1.32            +----------+-------------+----------+-----------+-------+ Opthalmic     15.00                 1.84            +----------+-------------+----------+-----------+-------+ ICA siphon    25.00                 1.54            +----------+-------------+----------+-----------+-------+  +----------+------------+----------+-----------+-------+ LEFT TCD  Left VM (cm)Depth (cm)PulsatilityComment +----------+------------+----------+-----------+-------+ MCA          50.00                 1.41            +----------+------------+----------+-----------+-------+ Term ICA     39.00                 1.55             +----------+------------+----------+-----------+-------+ PCA          39.00                 1.31            +----------+------------+----------+-----------+-------+ Opthalmic    18.00                 1.69            +----------+------------+----------+-----------+-------+ ICA siphon   49.00                 1.75            +----------+------------+----------+-----------+-------+ Distal ICA  26.00                 1.60            +----------+------------+----------+-----------+-------+     Preliminary       Subjective: States that she feels much better today.  Dyspnea resolved and breathing back to baseline.  Indicates that she walked with RN and only needed 1 L/min oxygen.  No dizziness or lightheadedness.  As per RN, no acute issues noted.  Discharge Exam:  Vitals:   03/20/18 0727 03/20/18 0807 03/20/18 1129 03/20/18 1421  BP:  (!) 130/55 136/64   Pulse:  93 99   Resp:  20 16   Temp:  (!) 97.5 F (36.4 C) 98.6 F (37 C)   TempSrc:  Oral Oral   SpO2: 97% 100% 94% 94%  Weight:      Height:        General exam: Pleasant elderly female, moderately built and nourished sitting up comfortably at edge of bed without distress. Respiratory system:  Occasional basal crackles but otherwise clear to auscultation.  No increased work of breathing. Cardiovascular system: S1 & S2 heard, RRR. No JVD, murmurs, rubs, gallops or clicks. No pedal edema.   Gastrointestinal system: Abdomen is nondistended, soft and nontender. No organomegaly or masses felt. Normal bowel sounds heard.  Central nervous system: Alert and oriented. No focal neurological deficits. Extremities:?  Left side grade 4+ by 5 power.  Right side grade 5 x 5.  No pronator drift. Skin: No rashes, lesions or ulcers Psychiatry: Judgement and insight appear somewhat impaired. Mood & affect appropriate.     The results of significant diagnostics from this hospitalization (including imaging, microbiology, ancillary  and laboratory) are listed below for reference.     Microbiology: Recent Results (from the past 240 hour(s))  Respiratory Panel by PCR     Status: None   Collection Time: 03/16/18  8:31 PM  Result Value Ref Range Status   Adenovirus NOT DETECTED NOT DETECTED Final   Coronavirus 229E NOT DETECTED NOT DETECTED Final   Coronavirus HKU1 NOT DETECTED NOT DETECTED Final   Coronavirus NL63 NOT DETECTED NOT DETECTED Final   Coronavirus OC43 NOT DETECTED NOT DETECTED Final   Metapneumovirus NOT DETECTED NOT DETECTED Final   Rhinovirus / Enterovirus NOT DETECTED NOT DETECTED Final   Influenza A NOT DETECTED NOT DETECTED Final   Influenza B NOT DETECTED NOT DETECTED Final   Parainfluenza Virus 1 NOT DETECTED NOT DETECTED Final   Parainfluenza Virus 2 NOT DETECTED NOT DETECTED Final   Parainfluenza Virus 3 NOT DETECTED NOT DETECTED Final   Parainfluenza Virus 4 NOT DETECTED NOT DETECTED Final   Respiratory Syncytial Virus NOT DETECTED NOT DETECTED Final   Bordetella pertussis NOT DETECTED NOT DETECTED Final   Chlamydophila pneumoniae NOT DETECTED NOT DETECTED Final   Mycoplasma pneumoniae NOT DETECTED NOT DETECTED Final    Comment: Performed at [facility], [address] [place], [place] [address]  MRSA PCR Screening     Status: None   Collection Time: 03/16/18  8:31 PM  Result Value Ref Range Status   MRSA by PCR NEGATIVE NEGATIVE Final    Comment:        The GeneXpert MRSA Assay (FDA approved for NASAL specimens only), is one component of a comprehensive MRSA colonization surveillance program. It is not intended to diagnose MRSA infection nor to guide or monitor treatment for MRSA infections. Performed at [facility], [place]  [address] [place], [place] [address]   Culture, blood (Routine X 2) w Reflex to ID Panel     Status: None (Preliminary result)   Collection Time: 03/16/18  9:00 PM  Result Value Ref Range Status   Specimen Description BLOOD RIGHT ANTECUBITAL   Final   Special Requests   Final    BOTTLES DRAWN AEROBIC ONLY Blood Culture results may not be optimal due to an inadequate volume of blood received in culture bottles   Culture   Final    NO GROWTH 4 DAYS Performed at [facility], [place] [address] [place], [place] [address]    Report Status PENDING  Incomplete  Culture, blood (Routine X 2) w Reflex to ID Panel     Status: None (Preliminary result)   Collection Time: 03/16/18  9:09 PM  Result Value Ref Range Status   Specimen Description BLOOD RIGHT ANTECUBITAL  Final   Special Requests   Final    BOTTLES DRAWN AEROBIC ONLY Blood Culture results may not be optimal due to an inadequate volume of blood received in culture bottles   Culture   Final    NO GROWTH 4 DAYS Performed at [facility], [place] [address], [place], [place] [address]    Report Status PENDING  Incomplete     Labs: CBC: Recent Labs  Lab 03/16/18 0617 03/16/18 0753 03/16/18 1801 03/17/18 0507 03/18/18 0658  WBC 11.8*  --   --  17.0* 20.3*  NEUTROABS 8.8*  --   --   --   --   HGB 10.3* 10.9* 10.5* 10.1* 9.9*  HCT 32.4* 32.0* 31.0* 31.8* 31.0*  MCV 84.2  --   --  81.7 82.0  PLT 359  --   --  345 993   Basic Metabolic Panel: Recent Labs  Lab 03/16/18 1902 03/17/18 0507 03/18/18 0658 03/19/18 0418 03/20/18 0440 03/20/18 1257  NA  --  136 138 137 138 139  K  --  3.5 3.6 3.5 3.3* 4.6  CL  --  104 106 104 102 105  CO2  --  21* 23 23 20* 22  GLUCOSE  --  164* 82 224* 164* 246*  BUN  --  29* 33* 39* 35* 31*  CREATININE  --  1.94* 1.59* 1.68* 1.67* 1.54*  CALCIUM  --  8.6* 9.2 9.0 8.9 9.4  MG 1.7  --  1.8  --   --   --   PHOS 5.6* 4.1 3.5  --   --   --    Liver Function Tests: Recent Labs  Lab 03/16/18 0617 03/17/18 0507  AST 15  --   ALT 14  --   ALKPHOS 125  --   BILITOT 0.6  --   PROT 7.6  --   ALBUMIN 3.4* 2.9*   BNP (last 3 results) Recent Labs    03/16/18 0911  BNP 279.6*   CBG: Recent Labs  Lab 03/19/18 1733  03/19/18 2103 03/20/18 0221 03/20/18 0622 03/20/18 1119  GLUCAP 302* 301* 197* 130* 116*   Urinalysis    Component Value Date/Time   COLORURINE YELLOW 03/16/2018 1920   APPEARANCEUR HAZY (A) 03/16/2018 1920   LABSPEC 1.015 03/16/2018 1920   PHURINE 5.0 03/16/2018 1920   GLUCOSEU NEGATIVE 03/16/2018 1920   GLUCOSEU 250 (A) 08/17/2017 1411   HGBUR SMALL (A) 03/16/2018 1920   BILIRUBINUR NEGATIVE 03/16/2018 1920   KETONESUR NEGATIVE 03/16/2018 1920   PROTEINUR 30 (A) 03/16/2018 1920   UROBILINOGEN 0.2  08/17/2017 1411   NITRITE NEGATIVE 03/16/2018 1920   LEUKOCYTESUR NEGATIVE 03/16/2018 1920    I discussed in detail with patient's son, updated care and answered all questions.  Time coordinating discharge: 40 minutes  SIGNED:  Vernell Leep, MD, FACP, [facility]. [name]  To contact the attending provider between 7A-7P or the covering provider during after hours 7P-7A, please log into the web site [email or web address] and access using universal [place] password for that web site. If you do not have the password, please call the hospital operator.

## 2018-03-20 NOTE — Care Management (Signed)
Requested home oxygen and 3/1 to be delivered to room.

## 2018-03-20 NOTE — Progress Notes (Signed)
Patient is discharging home with home health. Significant other is here for transport. All discharge paperwork went over patient and significant other. All questions and concerns addressed. Patient taken down in wheelchair. Henry

## 2018-03-21 LAB — CULTURE, BLOOD (ROUTINE X 2)
CULTURE: NO GROWTH
Culture: NO GROWTH

## 2018-03-28 ENCOUNTER — Other Ambulatory Visit: Payer: Self-pay | Admitting: Endocrinology

## 2018-03-29 ENCOUNTER — Other Ambulatory Visit: Payer: Self-pay | Admitting: Vascular Surgery

## 2018-03-29 DIAGNOSIS — I739 Peripheral vascular disease, unspecified: Secondary | ICD-10-CM

## 2018-03-29 MED ORDER — CILOSTAZOL 100 MG PO TABS: ORAL_TABLET | ORAL | 3 refills | Status: DC

## 2018-03-31 ENCOUNTER — Inpatient Hospital Stay: Payer: Medicare Other | Admitting: Primary Care

## 2018-03-31 NOTE — Progress Notes (Deleted)
@Patient  ID: Modena Slater, female    DOB: [birth year], 73 y.o.   MRN: [id number]  No chief complaint on file.   Referring provider: [name], MD  HPI: 73 year old female, current every day smoker. PMH significant for COPD, respiratory failure with hypoxia, acute pulmonary edema, GERD, TIA, unstable angina, CAD, HTN, type 2 diabetes (insulin pump), CKD, anxiety. Recent hospital admission for left sided weakness and dyspnea. New to [facility] clinic, in-patient consulted by Dr. Loanne Drilling.   Hospital course: Presented with left sided weakness x 2-3 days and shortness of breath. Found to be hypoxic with worsening shortness of breath and placed on BiPAP. Admitted to ICU, refused intubation/ patient is DNR/DNI. Initial CT head negative for acute ischemic or hemorrhagic stroke. CXR showed cardiomegaly, atherosclerotic changes of aorta and pulmonary edema pattern. Started on empiric doxycyline, Brovonna/pulmicort BID, solumedrol 60mg  q12hr, duonebs and lasix 40mg  IV.   03/31/2018 Patient presents today for hospital follow-up.       No Known Allergies  Immunization History  Administered Date(s) Administered  . Influenza-Unspecified 11/14/2014  . Td 04/23/2005  . Tdap 06/14/2015    Past Medical History:  Diagnosis Date  . Anemia   . Anxiety   . Arthritis   . CAD (coronary artery disease) 2016   non-obstructive at cath  . Colon polyps 09/11/2010   Tubular adenoma  . COPD 12/14/2008   PATIENT DENIES    . Depression   . Diabetic coma ([place]) Feb. 2014  . Fall at home 2016   x 2 in January 2016, Left knee  . Fibromyalgia   . GERD 07/20/2006  . Headache(784.0)   . History of transient ischemic attack (TIA)   . HPV (human papilloma virus) infection   . Hyperlipidemia   . Hypertension   . Hypothyroidism   . Insulin dependent type 2 diabetes mellitus, controlled ([place]) 1989  . Lumbar disc disease   . Neuromuscular disorder ([place])    PERIPHERAL NEUROPATHY  . Peripheral  vascular disease ([place])   . Personal history of malignant neoplasm of kidney(V10.52) 12/14/2008   Laparoscopic biopsy and cryoablation 7/08 Dr. Vernie Shanks   . Renal disorder    chronic kidney disease   . Stroke [facility])    3 MINI STROKES  RT SIDED WEAKNESS  . Vertigo     Tobacco History: Social History   Tobacco Use  Smoking Status Current Every Day Smoker  . Packs/day: 1.50  . Years: 50.00  . Pack years: 75.00  . Types: Cigarettes  . Last attempt to quit: 06/24/2014  . Years since quitting: 3.7  Smokeless Tobacco Never Used   Ready to quit: Not Answered Counseling given: Not Answered   Outpatient Medications Prior to Visit  Medication Sig Dispense Refill  . ACCU-CHEK FASTCLIX LANCETS MISC Use 4 per day to check blood sugar dx code E11.9 102 each 3  . albuterol (PROVENTIL HFA;VENTOLIN HFA) 108 (90 Base) MCG/ACT inhaler Inhale 1-2 puffs into the lungs every 6 (six) hours as needed for wheezing or shortness of breath. 1 Inhaler 0  . aspirin EC 325 MG EC tablet Take 1 tablet (325 mg total) by mouth daily. 30 tablet 0  . cilostazol (PLETAL) 100 MG tablet TAKE 1 TABLET BY MOUTH TWICE A DAY BEFORE MEALS 60 tablet 3  . DULoxetine (CYMBALTA) 60 MG capsule Take 1 capsule (60 mg total) by mouth daily. 90 capsule 3  . ferrous sulfate 325 (65 FE) MG tablet Take 325 mg by mouth daily with breakfast.     .  fludrocortisone (FLORINEF) 0.1 MG tablet Take 1 tablet (0.1 mg total) by mouth 3 (three) times a week. 12 tablet 3  . furosemide (LASIX) 20 MG tablet Take 1 tablet (20 mg total) by mouth daily. 10 tablet 0  . gabapentin (NEURONTIN) 300 MG capsule Take 1 capsule (300 mg total) by mouth 2 (two) times daily. 60 capsule 3  . glucose blood (CONTOUR NEXT TEST) test strip Use to test blood sugar 3 times daily E11.9 100 each 8  . HYDROcodone-acetaminophen (NORCO) 10-325 MG tablet Take 1 tablet by mouth 3 (three) times daily as needed (for pain). Follow up with PCP if refills needed    . hydrOXYzine  (ATARAX/VISTARIL) 25 MG tablet Take 1 tablet (25 mg total) by mouth every 6 (six) hours. 30 tablet 0  . insulin lispro (HUMALOG) 100 UNIT/ML injection Use 70 units daily in insulin pump 30 mL 3  . Insulin Pen Needle (BD PEN NEEDLE NANO U/F) 32G X 4 MM MISC Use to inject insulin 100 each 3  . lansoprazole (PREVACID) 30 MG capsule Take 1 capsule (30 mg total) by mouth daily.    Marland Kitchen levothyroxine (SYNTHROID, LEVOTHROID) 150 MCG tablet Take 1 tablet (150 mcg total) by mouth daily. 90 tablet 3  . metoprolol succinate (TOPROL-XL) 25 MG 24 hr tablet TAKE 1 TABLET BY MOUTH EVERY DAY (Patient taking differently: Take 25 mg by mouth daily. ) 90 tablet 3  . nicotine (NICODERM CQ - DOSED IN MG/24 HOURS) 14 mg/24hr patch Place 1 patch (14 mg total) onto the skin daily. 28 patch 0  . nortriptyline (PAMELOR) 75 MG capsule Take 75 mg by mouth at bedtime.    . polyethylene glycol (MIRALAX / GLYCOLAX) packet Take 17 g by mouth daily. 14 each 0  . potassium chloride (KLOR-CON 10) 10 MEQ tablet Take 1 tablet (10 mEq total) by mouth 2 (two) times daily. 60 tablet 1  . predniSONE (DELTASONE) 10 MG tablet Take 3 tabs daily for 2 days, then 2 tabs daily for 3 days, then 1 tab daily for 3 days, then stop. 15 tablet 0  . QUEtiapine (SEROQUEL) 100 MG tablet Take 100 mg by mouth at bedtime.  4  . senna (SENOKOT) 8.6 MG TABS tablet Take 2 tablets (17.2 mg total) by mouth daily. 30 each 0  . tolterodine (DETROL LA) 4 MG 24 hr capsule Take 4 mg by mouth daily.     Marland Kitchen triamcinolone cream (KENALOG) 0.1 % Apply 1 application topically 2 (two) times daily. 30 g 0  . Vitamin D, Ergocalciferol, (DRISDOL) 50000 units CAPS capsule Take 50,000 Units by mouth once a week.  11   No facility-administered medications prior to visit.       Review of Systems  Review of Systems   Physical Exam  There were no vitals taken for this visit. Physical Exam   Lab Results:  CBC    Component Value Date/Time   WBC 20.3 (H) 03/18/2018 0658    RBC 3.78 (L) 03/18/2018 0658   HGB 9.9 (L) 03/18/2018 0658   HGB 11.9 04/20/2017 1047   HCT 31.0 (L) 03/18/2018 0658   HCT 34.9 04/20/2017 1047   PLT 381 03/18/2018 0658   PLT 350 04/20/2017 1047   MCV 82.0 03/18/2018 0658   MCV 89 04/20/2017 1047   MCH 26.2 03/18/2018 0658   MCHC 31.9 03/18/2018 0658   RDW 14.4 03/18/2018 0658   RDW 14.7 04/20/2017 1047   LYMPHSABS 2.0 03/16/2018 0617   LYMPHSABS 1.9 04/20/2017  1047   MONOABS 0.6 03/16/2018 0617   EOSABS 0.3 03/16/2018 0617   EOSABS 0.1 04/20/2017 1047   BASOSABS 0.1 03/16/2018 0617   BASOSABS 0.1 04/20/2017 1047    BMET    Component Value Date/Time   NA 139 03/20/2018 1257   NA 138 04/20/2017 1047   K 4.6 03/20/2018 1257   CL 105 03/20/2018 1257   CO2 22 03/20/2018 1257   GLUCOSE 246 (H) 03/20/2018 1257   BUN 31 (H) 03/20/2018 1257   BUN 17 04/20/2017 1047   CREATININE 1.54 (H) 03/20/2018 1257   CALCIUM 9.4 03/20/2018 1257   GFRNONAA 34 (L) 03/20/2018 1257   GFRAA 39 (L) 03/20/2018 1257    BNP    Component Value Date/Time   BNP 279.6 (H) 03/16/2018 0911    ProBNP    Component Value Date/Time   PROBNP 330.8 (H) 09/21/2012 1330    Imaging: Dg Chest 1 View  Result Date: 03/16/2018 CLINICAL DATA:  Dyspnea with agitation EXAM: CHEST  1 VIEW COMPARISON:  03/16/2018 at 0756 hours FINDINGS: Stable cardiomegaly. Nonaneurysmal atherosclerosis of the aorta. Interval increase in interstitial edema superimposed on coarsened interstitial markings. No pneumothorax or significant pleural effusions given the frontal projection. No acute osseous abnormality. IMPRESSION: Interval increase in interstitial edema superimposed on coarsened interstitial markings. Stable cardiomegaly with aortic atherosclerosis. Electronically Signed   By: Ashley Royalty M.D.   On: 03/16/2018 18:09   Dg Abdomen 1 View  Result Date: 03/16/2018 CLINICAL DATA:  Poor historian.  Evaluate for metallic foreign body. EXAM: ABDOMEN - 1 VIEW COMPARISON:   None. FINDINGS: No definitive ingested radiopaque foreign body though there is exclusion of the lateral-most aspect the right mid hemiabdomen. Moderate to large colonic stool burden without evidence of enteric obstruction. Nondiagnostic evaluation for pneumoperitoneum secondary to supine positioning and exclusion of the lower thorax. No pneumatosis or portal venous gas. Post cholecystectomy. Punctate phleboliths overlies the right hemipelvis. Degenerative change the lower thoracic and lumbar spine. Atherosclerotic plaque overlies the abdominal aorta and pelvic vasculature. IMPRESSION: 1. No ingested radiopaque foreign body. 2. Moderate to large colonic stool burden without evidence of enteric obstruction. Electronically Signed   By: Sandi Mariscal M.D.   On: 03/16/2018 10:21   Ct Head Wo Contrast  Result Date: 03/16/2018 CLINICAL DATA:  Possible stroke. Ataxia with left-sided arm and leg weakness. EXAM: CT HEAD WITHOUT CONTRAST TECHNIQUE: Contiguous axial images were obtained from the base of the skull through the vertex without intravenous contrast. COMPARISON:  04/22/2017 FINDINGS: Brain: No evidence of acute infarction, hemorrhage, hydrocephalus, extra-axial collection or mass lesion/mass effect. Mild chronic small vessel ischemic type change in the cerebral white matter. Vascular: Atherosclerotic calcification.  No hyperdense vessel Skull: No acute finding Sinuses/Orbits: Endoscopic sinus surgery on the right. There is volume loss at the right ethmoids with relatively expanded right orbit. Bilateral cataract resection. IMPRESSION: No acute finding. Electronically Signed   By: Monte Fantasia M.D.   On: 03/16/2018 07:16   Mr Brain Wo Contrast  Result Date: 03/16/2018 CLINICAL DATA:  73 year old female with ataxia, left extremity weakness. Possible stroke. EXAM: MRI HEAD WITHOUT CONTRAST TECHNIQUE: Multiplanar, multiecho pulse sequences of the brain and surrounding structures were obtained without intravenous  contrast. COMPARISON:  Head CT without contrast 0649 hours today. Brain MRI 04/22/2017 and earlier. FINDINGS: The examination had to be discontinued prior to completion by patient request. Diffusion and motion degraded sagittal T1 weighted imaging only was obtained. No restricted diffusion or evidence of acute infarction. No midline  shift, mass effect, or evidence of intracranial mass lesion. No ventriculomegaly. Patchy and confluent facilitated diffusion in the cerebral white matter corresponds to T2 and FLAIR hyperintensity in 2019. Sagittal T1 weighted imaging appears grossly stable and negative. IMPRESSION: 1. Negative for acute infarct. 2. The examination had to be discontinued prior to completion by patient request. Diffusion and motion degraded sagittal T1 weighted imaging only was obtained. Electronically Signed   By: Genevie Ann M.D.   On: 03/16/2018 14:08   Dg Chest Port 1 View  Result Date: 03/19/2018 CLINICAL DATA:  Hypoxia. Congestive heart failure. COPD. EXAM: PORTABLE CHEST 1 VIEW COMPARISON:  03/17/2018 FINDINGS: Stable mild cardiomegaly. Diffuse mixed interstitial and airspace disease shows no significant change. Small right pleural effusion also stable. IMPRESSION: No significant change compared with prior exam. Electronically Signed   By: Earle Gell M.D.   On: 03/19/2018 16:07   Dg Chest Port 1 View  Result Date: 03/17/2018 CLINICAL DATA:  73 year old female with a history of acute congestive heart failure EXAM: PORTABLE CHEST 1 VIEW COMPARISON:  03/16/2018, 03/16/2018, 12/10/2017 FINDINGS: Cardiomediastinal silhouette unchanged with cardiomegaly. Similar appearance of mixed interstitial and airspace opacities of the bilateral lungs with improved aeration on the right. Superimposed interlobular septal thickening. No pneumothorax. Blunting of the right costophrenic angle. IMPRESSION: Improved aeration of the right lung, with persisting bilateral mixed interstitial and airspace opacity. Small  right pleural effusion. Electronically Signed   By: Corrie Mckusick D.O.   On: 03/17/2018 09:24   Dg Chest Portable 1 View  Result Date: 03/16/2018 CLINICAL DATA:  Shortness of breath and hypoxia EXAM: PORTABLE CHEST 1 VIEW COMPARISON:  12/10/2017 FINDINGS: Cardiopericardial enlargement, increased from prior. Diffuse interstitial coarsening that could be congestive or bronchitic. There is a linear density in the right suprahilar lung that is scarring when compared to priors. No effusion or pneumothorax. IMPRESSION: Increased cardiopericardial size with generalized congestive or bronchitic interstitial coarsening. Electronically Signed   By: Monte Fantasia M.D.   On: 03/16/2018 08:14   Vas US Carotid (at [place] Only)  Result Date: 03/18/2018 Carotid Arterial Duplex Study Indications: TIA. Performing Technologist: Abram Sander RVS  Examination Guidelines: A complete evaluation includes B-mode imaging, spectral Doppler, color Doppler, and power Doppler as needed of all accessible portions of each vessel. Bilateral testing is considered an integral part of a complete examination. Limited examinations for reoccurring indications may be performed as noted.  Right Carotid Findings: +----------+--------+--------+--------+------------+--------+           PSV cm/sEDV cm/sStenosisDescribe    Comments +----------+--------+--------+--------+------------+--------+ CCA Prox  114                     heterogenous         +----------+--------+--------+--------+------------+--------+ CCA Distal143     13              heterogenous         +----------+--------+--------+--------+------------+--------+ ICA Prox  164     40      40-59%  heterogenous         +----------+--------+--------+--------+------------+--------+ ICA Distal130     25                                   +----------+--------+--------+--------+------------+--------+ ECA       160                                           +----------+--------+--------+--------+------------+--------+ +----------+--------+-------+--------+-------------------+  PSV cm/sEDV cmsDescribeArm Pressure (mmHG) +----------+--------+-------+--------+-------------------+ Subclavian122                                        +----------+--------+-------+--------+-------------------+ +---------+--------+--+--------+--+---------+ VertebralPSV cm/s50EDV cm/s15Antegrade +---------+--------+--+--------+--+---------+  Left Carotid Findings: +----------+--------+--------+--------+------------+--------+           PSV cm/sEDV cm/sStenosisDescribe    Comments +----------+--------+--------+--------+------------+--------+ CCA Prox  108     14              heterogenous         +----------+--------+--------+--------+------------+--------+ CCA [id number]     14              heterogenous         +----------+--------+--------+--------+------------+--------+ ICA Prox  220     49      40-59%  heterogenous         +----------+--------+--------+--------+------------+--------+ ICA Distal101     20                                   +----------+--------+--------+--------+------------+--------+ ECA       195     6                                    +----------+--------+--------+--------+------------+--------+ +----------+--------+--------+--------+-------------------+ SubclavianPSV cm/sEDV cm/sDescribeArm Pressure (mmHG) +----------+--------+--------+--------+-------------------+           228                                         +----------+--------+--------+--------+-------------------+ +---------+--------+--+--------+--+---------+ VertebralPSV cm/s58EDV cm/s10Antegrade +---------+--------+--+--------+--+---------+  Summary: Right Carotid: Velocities in the right ICA are consistent with a 40-59%                stenosis. Left Carotid: Velocities in the left ICA are consistent with a 40-59% stenosis.  Vertebrals: Bilateral vertebral arteries demonstrate antegrade flow. *See table(s) above for measurements and observations.  Electronically signed by Antony Contras MD on 03/18/2018 at 12:25:24 PM.    Final    Vas [place] Transcranial Doppler  Result Date: 03/18/2018  Transcranial Doppler Indications: Stroke. Performing Technologist: Abram Sander RVS  Examination Guidelines: A complete evaluation includes B-mode imaging, spectral Doppler, color Doppler, and power Doppler as needed of all accessible portions of each vessel. Bilateral testing is considered an integral part of a complete examination. Limited examinations for reoccurring indications may be performed as noted.  +----------+-------------+----------+-----------+-------+ RIGHT TCD Right VM (cm)Depth (cm)PulsatilityComment +----------+-------------+----------+-----------+-------+ MCA           54.00                 1.52            +----------+-------------+----------+-----------+-------+ ACA           25.00                 1.87            +----------+-------------+----------+-----------+-------+ Term ICA      21.00                 1.32            +----------+-------------+----------+-----------+-------+ Opthalmic     15.00  1.84            +----------+-------------+----------+-----------+-------+ ICA siphon    25.00                 1.54            +----------+-------------+----------+-----------+-------+  +----------+------------+----------+-----------+-------+ LEFT TCD  Left VM (cm)Depth (cm)PulsatilityComment +----------+------------+----------+-----------+-------+ MCA          50.00                 1.41            +----------+------------+----------+-----------+-------+ Term ICA     39.00                 1.55            +----------+------------+----------+-----------+-------+ PCA          39.00                 1.31            +----------+------------+----------+-----------+-------+  Opthalmic    18.00                 1.69            +----------+------------+----------+-----------+-------+ ICA siphon   49.00                 1.75            +----------+------------+----------+-----------+-------+ Distal ICA   26.00                 1.60            +----------+------------+----------+-----------+-------+  Summary:  Absent occipital window limits evaluation of posterior circulation. Normal mean flow velocities in all identified vessels of anterior circulation. Globally elevated pulsatility indexes suggestive likely diffuse intracranial atherosclerosis. *See table(s) above for measurements and observations.  Diagnosing physician: Antony Contras MD Electronically signed by Antony Contras MD on 03/18/2018 at 12:26:44 PM.    Final      Assessment & Plan:   No problem-specific Assessment & Plan notes found for this encounter.     Martyn Ehrich, NP 03/31/2018

## 2018-04-02 ENCOUNTER — Emergency Department (HOSPITAL_COMMUNITY): Payer: Medicare Other

## 2018-04-02 ENCOUNTER — Encounter (HOSPITAL_COMMUNITY): Payer: Self-pay | Admitting: Emergency Medicine

## 2018-04-02 ENCOUNTER — Inpatient Hospital Stay (HOSPITAL_COMMUNITY)
Admission: EM | Admit: 2018-04-02 | Discharge: 2018-04-07 | DRG: 193 | Disposition: A | Discharge status: Home Health | Payer: Medicare Other | Attending: Internal Medicine | Admitting: Internal Medicine

## 2018-04-02 DIAGNOSIS — E872 Acidosis: Secondary | ICD-10-CM | POA: Diagnosis present

## 2018-04-02 DIAGNOSIS — I130 Hypertensive heart and chronic kidney disease with heart failure and stage 1 through stage 4 chronic kidney disease, or unspecified chronic kidney disease: Secondary | ICD-10-CM | POA: Diagnosis present

## 2018-04-02 DIAGNOSIS — J440 Chronic obstructive pulmonary disease with acute lower respiratory infection: Secondary | ICD-10-CM | POA: Diagnosis present

## 2018-04-02 DIAGNOSIS — Z66 Do not resuscitate: Secondary | ICD-10-CM | POA: Diagnosis present

## 2018-04-02 DIAGNOSIS — Z7902 Long term (current) use of antithrombotics/antiplatelets: Secondary | ICD-10-CM | POA: Diagnosis not present

## 2018-04-02 DIAGNOSIS — G894 Chronic pain syndrome: Secondary | ICD-10-CM | POA: Diagnosis present

## 2018-04-02 DIAGNOSIS — Z85528 Personal history of other malignant neoplasm of kidney: Secondary | ICD-10-CM

## 2018-04-02 DIAGNOSIS — J9620 Acute and chronic respiratory failure, unspecified whether with hypoxia or hypercapnia: Secondary | ICD-10-CM

## 2018-04-02 DIAGNOSIS — Z79890 Hormone replacement therapy (postmenopausal): Secondary | ICD-10-CM | POA: Diagnosis not present

## 2018-04-02 DIAGNOSIS — E119 Type 2 diabetes mellitus without complications: Secondary | ICD-10-CM

## 2018-04-02 DIAGNOSIS — R339 Retention of urine, unspecified: Secondary | ICD-10-CM | POA: Diagnosis present

## 2018-04-02 DIAGNOSIS — E669 Obesity, unspecified: Secondary | ICD-10-CM | POA: Diagnosis present

## 2018-04-02 DIAGNOSIS — N183 Chronic kidney disease, stage 3 (moderate): Secondary | ICD-10-CM | POA: Diagnosis present

## 2018-04-02 DIAGNOSIS — F17200 Nicotine dependence, unspecified, uncomplicated: Secondary | ICD-10-CM | POA: Diagnosis not present

## 2018-04-02 DIAGNOSIS — J189 Pneumonia, unspecified organism: Secondary | ICD-10-CM | POA: Diagnosis present

## 2018-04-02 DIAGNOSIS — E785 Hyperlipidemia, unspecified: Secondary | ICD-10-CM | POA: Diagnosis present

## 2018-04-02 DIAGNOSIS — I2510 Atherosclerotic heart disease of native coronary artery without angina pectoris: Secondary | ICD-10-CM | POA: Diagnosis present

## 2018-04-02 DIAGNOSIS — J181 Lobar pneumonia, unspecified organism: Secondary | ICD-10-CM | POA: Diagnosis present

## 2018-04-02 DIAGNOSIS — Z9049 Acquired absence of other specified parts of digestive tract: Secondary | ICD-10-CM

## 2018-04-02 DIAGNOSIS — E039 Hypothyroidism, unspecified: Secondary | ICD-10-CM | POA: Diagnosis present

## 2018-04-02 DIAGNOSIS — F419 Anxiety disorder, unspecified: Secondary | ICD-10-CM | POA: Diagnosis present

## 2018-04-02 DIAGNOSIS — R338 Other retention of urine: Secondary | ICD-10-CM | POA: Diagnosis not present

## 2018-04-02 DIAGNOSIS — F329 Major depressive disorder, single episode, unspecified: Secondary | ICD-10-CM | POA: Diagnosis present

## 2018-04-02 DIAGNOSIS — N189 Chronic kidney disease, unspecified: Secondary | ICD-10-CM | POA: Diagnosis not present

## 2018-04-02 DIAGNOSIS — R059 Cough, unspecified: Secondary | ICD-10-CM

## 2018-04-02 DIAGNOSIS — R0602 Shortness of breath: Secondary | ICD-10-CM | POA: Diagnosis present

## 2018-04-02 DIAGNOSIS — Z794 Long term (current) use of insulin: Secondary | ICD-10-CM

## 2018-04-02 DIAGNOSIS — N179 Acute kidney failure, unspecified: Secondary | ICD-10-CM | POA: Diagnosis not present

## 2018-04-02 DIAGNOSIS — M797 Fibromyalgia: Secondary | ICD-10-CM | POA: Diagnosis present

## 2018-04-02 DIAGNOSIS — I69354 Hemiplegia and hemiparesis following cerebral infarction affecting left non-dominant side: Secondary | ICD-10-CM

## 2018-04-02 DIAGNOSIS — Z9981 Dependence on supplemental oxygen: Secondary | ICD-10-CM

## 2018-04-02 DIAGNOSIS — E1142 Type 2 diabetes mellitus with diabetic polyneuropathy: Secondary | ICD-10-CM | POA: Diagnosis present

## 2018-04-02 DIAGNOSIS — IMO0001 Reserved for inherently not codable concepts without codable children: Secondary | ICD-10-CM

## 2018-04-02 DIAGNOSIS — D649 Anemia, unspecified: Secondary | ICD-10-CM | POA: Diagnosis present

## 2018-04-02 DIAGNOSIS — J9621 Acute and chronic respiratory failure with hypoxia: Secondary | ICD-10-CM | POA: Diagnosis not present

## 2018-04-02 DIAGNOSIS — Z79899 Other long term (current) drug therapy: Secondary | ICD-10-CM

## 2018-04-02 DIAGNOSIS — G4700 Insomnia, unspecified: Secondary | ICD-10-CM | POA: Diagnosis present

## 2018-04-02 DIAGNOSIS — F17210 Nicotine dependence, cigarettes, uncomplicated: Secondary | ICD-10-CM | POA: Diagnosis present

## 2018-04-02 DIAGNOSIS — E1151 Type 2 diabetes mellitus with diabetic peripheral angiopathy without gangrene: Secondary | ICD-10-CM | POA: Diagnosis present

## 2018-04-02 DIAGNOSIS — Z9641 Presence of insulin pump (external) (internal): Secondary | ICD-10-CM | POA: Diagnosis present

## 2018-04-02 DIAGNOSIS — I10 Essential (primary) hypertension: Secondary | ICD-10-CM | POA: Diagnosis not present

## 2018-04-02 DIAGNOSIS — J441 Chronic obstructive pulmonary disease with (acute) exacerbation: Secondary | ICD-10-CM

## 2018-04-02 DIAGNOSIS — E1122 Type 2 diabetes mellitus with diabetic chronic kidney disease: Secondary | ICD-10-CM | POA: Diagnosis present

## 2018-04-02 DIAGNOSIS — I5033 Acute on chronic diastolic (congestive) heart failure: Secondary | ICD-10-CM | POA: Diagnosis not present

## 2018-04-02 DIAGNOSIS — R05 Cough: Secondary | ICD-10-CM

## 2018-04-02 DIAGNOSIS — E7800 Pure hypercholesterolemia, unspecified: Secondary | ICD-10-CM | POA: Diagnosis not present

## 2018-04-02 DIAGNOSIS — Z7982 Long term (current) use of aspirin: Secondary | ICD-10-CM | POA: Diagnosis not present

## 2018-04-02 DIAGNOSIS — J9691 Respiratory failure, unspecified with hypoxia: Secondary | ICD-10-CM | POA: Diagnosis present

## 2018-04-02 DIAGNOSIS — Z6832 Body mass index (BMI) 32.0-32.9, adult: Secondary | ICD-10-CM

## 2018-04-02 LAB — POCT I-STAT 7, (LYTES, BLD GAS, ICA,H+H)
Acid-base deficit: 6 mmol/L — ABNORMAL HIGH (ref 0.0–2.0)
Bicarbonate: 19.2 mmol/L — ABNORMAL LOW (ref 20.0–28.0)
Calcium, Ion: 1.17 mmol/L (ref 1.15–1.40)
HCT: 27 % — ABNORMAL LOW (ref 36.0–46.0)
Hemoglobin: 9.2 g/dL — ABNORMAL LOW (ref 12.0–15.0)
O2 Saturation: 91 %
POTASSIUM: 4.3 mmol/L (ref 3.5–5.1)
Patient temperature: 98.2
Sodium: 136 mmol/L (ref 135–145)
TCO2: 20 mmol/L — ABNORMAL LOW (ref 22–32)
pCO2 arterial: 33.8 mmHg (ref 32.0–48.0)
pH, Arterial: 7.362 (ref 7.350–7.450)
pO2, Arterial: 62 mmHg — ABNORMAL LOW (ref 83.0–108.0)

## 2018-04-02 LAB — CBC WITH DIFFERENTIAL/PLATELET
Abs Immature Granulocytes: 0.13 10*3/uL — ABNORMAL HIGH (ref 0.00–0.07)
Basophils Absolute: 0.1 10*3/uL (ref 0.0–0.1)
Basophils Relative: 1 %
Eosinophils Absolute: 0.2 10*3/uL (ref 0.0–0.5)
Eosinophils Relative: 1 %
HCT: 31 % — ABNORMAL LOW (ref 36.0–46.0)
Hemoglobin: 9.5 g/dL — ABNORMAL LOW (ref 12.0–15.0)
Immature Granulocytes: 1 %
Lymphocytes Relative: 6 %
Lymphs Abs: 0.9 10*3/uL (ref 0.7–4.0)
MCH: 25.9 pg — ABNORMAL LOW (ref 26.0–34.0)
MCHC: 30.6 g/dL (ref 30.0–36.0)
MCV: 84.5 fL (ref 80.0–100.0)
Monocytes Absolute: 0.5 10*3/uL (ref 0.1–1.0)
Monocytes Relative: 3 %
Neutro Abs: 13.3 10*3/uL — ABNORMAL HIGH (ref 1.7–7.7)
Neutrophils Relative %: 88 %
Platelets: 341 10*3/uL (ref 150–400)
RBC: 3.67 MIL/uL — AB (ref 3.87–5.11)
RDW: 15.2 % (ref 11.5–15.5)
WBC: 15.1 10*3/uL — ABNORMAL HIGH (ref 4.0–10.5)
nRBC: 0 % (ref 0.0–0.2)

## 2018-04-02 LAB — POCT I-STAT EG7
Acid-base deficit: 7 mmol/L — ABNORMAL HIGH (ref 0.0–2.0)
BICARBONATE: 18.9 mmol/L — AB (ref 20.0–28.0)
Calcium, Ion: 1.2 mmol/L (ref 1.15–1.40)
HCT: 30 % — ABNORMAL LOW (ref 36.0–46.0)
Hemoglobin: 10.2 g/dL — ABNORMAL LOW (ref 12.0–15.0)
O2 Saturation: 80 %
Potassium: 4.2 mmol/L (ref 3.5–5.1)
Sodium: 138 mmol/L (ref 135–145)
TCO2: 20 mmol/L — ABNORMAL LOW (ref 22–32)
pCO2, Ven: 40.4 mmHg — ABNORMAL LOW (ref 44.0–60.0)
pH, Ven: 7.279 (ref 7.250–7.430)
pO2, Ven: 50 mmHg — ABNORMAL HIGH (ref 32.0–45.0)

## 2018-04-02 LAB — BASIC METABOLIC PANEL
Anion gap: 15 (ref 5–15)
BUN: 28 mg/dL — ABNORMAL HIGH (ref 8–23)
CALCIUM: 8.7 mg/dL — AB (ref 8.9–10.3)
CO2: 18 mmol/L — ABNORMAL LOW (ref 22–32)
Chloride: 103 mmol/L (ref 98–111)
Creatinine, Ser: 2.61 mg/dL — ABNORMAL HIGH (ref 0.44–1.00)
GFR calc Af Amer: 21 mL/min — ABNORMAL LOW (ref >60)
GFR calc non Af Amer: 18 mL/min — ABNORMAL LOW (ref >60)
Glucose, Bld: 242 mg/dL — ABNORMAL HIGH (ref 70–99)
Potassium: 4.5 mmol/L (ref 3.5–5.1)
SODIUM: 136 mmol/L (ref 135–145)

## 2018-04-02 LAB — BRAIN NATRIURETIC PEPTIDE: B NATRIURETIC PEPTIDE 5: 331.9 pg/mL — AB (ref 0.0–100.0)

## 2018-04-02 LAB — TROPONIN I: Troponin I: <0.03 ng/mL (ref <0.03)

## 2018-04-02 MED ORDER — SODIUM CHLORIDE 0.9 % IV SOLN
1.0000 g | INTRAVENOUS | Status: DC
  Administered 2018-04-03 – 2018-04-04 (×2): 1 g via INTRAVENOUS
  Filled 2018-04-02 (×3): qty 1

## 2018-04-02 MED ORDER — IPRATROPIUM-ALBUTEROL 0.5-2.5 (3) MG/3ML IN SOLN
3.0000 mL | Freq: Once | RESPIRATORY_TRACT | Status: AC
  Administered 2018-04-02: 3 mL via RESPIRATORY_TRACT
  Filled 2018-04-02: qty 3

## 2018-04-02 MED ORDER — METHYLPREDNISOLONE SODIUM SUCC 125 MG IJ SOLR
125.0000 mg | Freq: Once | INTRAMUSCULAR | Status: AC
  Administered 2018-04-02: 125 mg via INTRAVENOUS
  Filled 2018-04-02: qty 2

## 2018-04-02 MED ORDER — VANCOMYCIN HCL IN DEXTROSE 1-5 GM/200ML-% IV SOLN
1000.0000 mg | Freq: Once | INTRAVENOUS | Status: AC
  Administered 2018-04-03: 1000 mg via INTRAVENOUS
  Filled 2018-04-02: qty 200

## 2018-04-02 MED ORDER — SODIUM CHLORIDE 0.9 % IV SOLN
2.0000 g | Freq: Once | INTRAVENOUS | Status: AC
  Administered 2018-04-02: 2 g via INTRAVENOUS
  Filled 2018-04-02: qty 2

## 2018-04-02 MED ORDER — PREDNISONE 20 MG PO TABS
40.0000 mg | ORAL_TABLET | Freq: Once | ORAL | Status: AC
  Administered 2018-04-02: 40 mg via ORAL
  Filled 2018-04-02: qty 2

## 2018-04-02 MED ORDER — VANCOMYCIN HCL 500 MG IV SOLR
500.0000 mg | Freq: Once | INTRAVENOUS | Status: AC
  Administered 2018-04-03: 500 mg via INTRAVENOUS
  Filled 2018-04-02: qty 500

## 2018-04-02 MED ORDER — SODIUM CHLORIDE 0.9 % IV SOLN
INTRAVENOUS | Status: DC
  Administered 2018-04-03 (×2): via INTRAVENOUS

## 2018-04-02 NOTE — Progress Notes (Signed)
RT placed pt on continuous BIPAP per order. Pt on 12/6 FIO2 of 30%, pt tolerating well. ABG in one hour. RT will continue to monitor.

## 2018-04-02 NOTE — Progress Notes (Signed)
RT obtained ABG from pt with the following results. RT will continue to monitor.    Ref. Range 04/02/2018 21:51  Sample type Unknown ARTERIAL  pH, Arterial Latest Ref Range: 7.350 - 7.450  7.362  pCO2 arterial Latest Ref Range: 32.0 - 48.0 mmHg 33.8  pO2, Arterial Latest Ref Range: 83.0 - 108.0 mmHg 62.0 (L)  TCO2 Latest Ref Range: 22 - 32 mmol/L 20 (L)  Acid-base deficit Latest Ref Range: 0.0 - 2.0 mmol/L 6.0 (H)  Bicarbonate Latest Ref Range: 20.0 - 28.0 mmol/L 19.2 (L)  O2 Saturation Latest Units: % 91.0  Patient temperature Unknown 98.2 F  Collection site Unknown RADIAL, ALLEN'S TEST ACCEPTABLE

## 2018-04-02 NOTE — H&P (Addendum)
April Branch:[id number] DOB: [birth year] DOA: 04/02/2018    PCP: [name], MD   Outpatient Specialists:   CARDS:   Dr. Johnsie Cancel   Endocrinology [name] Patient arrived to ER on 04/02/18 at 1714  Patient coming from: home Lives  With family    Chief Complaint:  Chief Complaint  Patient presents with  . Shortness of Breath    HPI: April Branch is a 73 y.o. female with medical history significant of COPD anemia, anxiety, nonobstructive CAD, colon polyps, COPD, depression, fibromyalgia, GERD, headache, history of TIA, HPV, HLD, HTN, type 2 diabetes on insulin pump, peripheral neuropathy, peripheral vascular disease, history of renal cancer, history of vertigo     Presented with she was walking in her home and got more short of breath no lightheadedness or chest pain associated no palpitations nonproductive cough she thought it was panic attack she felt anxious.  Recently admitted left-sided weakness and numbness was having CHF exacerbation requiring admission to ICU on BiPAP MRI showed no CVA felt to be more of a TIA. Echogram done showing EF 60-65% The time of discharge her oxygen requirement was down to 1 L which was new for her and she was discharged home with plan to follow-up.  Etiology for acute respiratory failure felt to be multifactorial including pulmonary edema and COPD exacerbation or underlying interstitial lung. Responded well to IV Solu-Medrol was treated with Brovana Pulmicort nebulizers completed 5 days of doxycycline  disease unfortunately patient did not follow-up after discharge. Patient was encouraged to stop smoking  During last admission thought to maybe have adrenal insufficiency she was normotensive but the low a.m. cortisol she was on Florinef and plan was to have follow-up with endocrinology. Had hematuria during last admission with plan to have this repeated and see if persistent then needs to follow-up  Regarding pertinent Chronic problems:  History of diabetes last hemoglobin A1c 7.5 treated with Levemir twice a day and sliding scale diabetes coordinator have seen her during her last admission and she was transitioned to insulin pump on 24 January post to follow-up with endocrinology but did not   While in ER: PO prednisone and Duoneb Got worse hypoxic needed 4L  tachepneic up to mid 20's Got IV solumedrol On Bipap  Now feeling better CXR showing some abnormalities  bedside ECHO no RH strain   This x-ray showing possible new infiltrate she was started on cefepime and vancomycin  The following Work up has been ordered so far:  Orders Placed This Encounter  Procedures  . DG Chest Portable 1 View  . CT Chest Wo Contrast  . CBC with Differential  . Basic metabolic panel  . Brain natriuretic peptide  . Blood gas, venous  . Blood gas, arterial  . Consult to hospitalist  . vancomycin per pharmacy consult  . ceFEPime (MAXIPIME) per pharmacy consult  . Bipap  . POCT I-Stat EG7  . EKG 12-Lead     Following Medications were ordered in ER: Medications  vancomycin (VANCOCIN) IVPB 1000 mg/200 mL premix (has no administration in time range)  ceFEPIme (MAXIPIME) 2 g in sodium chloride 0.9 % 100 mL IVPB (2 g Intravenous New Bag/Given 04/02/18 2136)  ipratropium-albuterol (DUONEB) 0.5-2.5 (3) MG/3ML nebulizer solution 3 mL (3 mLs Nebulization Given 04/02/18 1754)  predniSONE (DELTASONE) tablet 40 mg (40 mg Oral Given 04/02/18 1913)  ipratropium-albuterol (DUONEB) 0.5-2.5 (3) MG/3ML nebulizer solution 3 mL (3 mLs Nebulization Given 04/02/18 2038)  methylPREDNISolone sodium succinate (SOLU-MEDROL) 125 mg/2 mL  injection 125 mg (125 mg Intravenous Given 04/02/18 2137)    Significant initial  Findings: Abnormal Labs Reviewed  CBC WITH DIFFERENTIAL/PLATELET - Abnormal; Notable for the following components:      Result Value   WBC 15.1 (*)    RBC 3.67 (*)    Hemoglobin 9.5 (*)    HCT 31.0 (*)    MCH 25.9 (*)    Neutro Abs  13.3 (*)    Abs Immature Granulocytes 0.13 (*)    All other components within normal limits  BASIC METABOLIC PANEL - Abnormal; Notable for the following components:   CO2 18 (*)    Glucose, Bld 242 (*)    BUN 28 (*)    Creatinine, Ser 2.61 (*)    Calcium 8.7 (*)    GFR calc non Af Amer 18 (*)    GFR calc Af Amer 21 (*)    All other components within normal limits  BRAIN NATRIURETIC PEPTIDE - Abnormal; Notable for the following components:   B Natriuretic Peptide 331.9 (*)    All other components within normal limits  POCT I-STAT EG7 - Abnormal; Notable for the following components:   pCO2, Ven 40.4 (*)    pO2, Ven 50.0 (*)    Bicarbonate 18.9 (*)    TCO2 20 (*)    Acid-base deficit 7.0 (*)    HCT 30.0 (*)    Hemoglobin 10.2 (*)    All other components within normal limits    Lactic Acid, Venous    Component Value Date/Time   LATICACIDVEN 1.02 04/23/2017 0027    Na 136 K 4.5  Cr    Up from baseline see below Lab Results  Component Value Date   CREATININE 2.61 (H) 04/02/2018   CREATININE 1.54 (H) 03/20/2018   CREATININE 1.67 (H) 03/20/2018     WBC 15  HG/HCT  stable,      Component Value Date/Time   HGB 10.2 (L) 04/02/2018 2048   HGB 11.9 04/20/2017 1047   HCT 30.0 (L) 04/02/2018 2048   HCT 34.9 04/20/2017 1047     Troponin (Point of Care Test) No results for input(s): TROPIPOC in the last 72 hours.   BNP (last 3 results) Recent Labs    03/16/18 0911 04/02/18 1805  BNP 279.6* 331.9*    ProBNP (last 3 results) No results for input(s): PROBNP in the last 8760 hours.     UA  ordered     CXR -Scattered interstitial infiltrates, increased in RIGHT upper lobe and improved in LEFT upper lobe since previous exam question pulmonary edema versus infection.    ECG:  Personally reviewed by me showing: HR : 83 Rhythm:  NSR    no evidence of ischemic changes QTC 449      ED Triage Vitals  Enc Vitals Group     BP 04/02/18 1721 (!) 97/56     Pulse  Rate 04/02/18 1721 83     Resp 04/02/18 1721 (!) 21     Temp 04/02/18 1721 98.2 F (36.8 C)     Temp src --      SpO2 04/02/18 1721 95 %     Weight 04/02/18 1720 168 lb (76.2 kg)     Height 04/02/18 1720 5\' 1"  (1.549 m)     Head Circumference --      Peak Flow --      Pain Score 04/02/18 1720 0     Pain Loc --      Pain Edu? --  Excl. in GC? --   TMAX(24)@       Latest  Blood pressure (!) 112/95, pulse 83, temperature 98.2 F (36.8 C), resp. rate 19, height 5\' 1"  (1.549 m), weight 76.2 kg, SpO2 95 %.     Hospitalist was called for admission for COPD exacerbation chronic respiratory failure hypoxia   Review of Systems:    Pertinent positives include:  shortness of breath at rest.  dyspnea on exertion  Constitutional:  No weight loss, night sweats, Fevers, chills, fatigue, weight loss  HEENT:  No headaches, Difficulty swallowing,Tooth/dental problems,Sore throat,  No sneezing, itching, ear ache, nasal congestion, post nasal drip,  Cardio-vascular:  No chest pain, Orthopnea, PND, anasarca, dizziness, palpitations.no Bilateral lower extremity swelling  GI:  No heartburn, indigestion, abdominal pain, nausea, vomiting, diarrhea, change in bowel habits, loss of appetite, melena, blood in stool, hematemesis Resp:  no , No excess mucus, no productive cough, No non-productive cough, No coughing up of blood.No change in color of mucus.No wheezing. Skin:  no rash or lesions. No jaundice GU:  no dysuria, change in color of urine, no urgency or frequency. No straining to urinate.  No flank pain.  Musculoskeletal:  No joint pain or no joint swelling. No decreased range of motion. No back pain.  Psych:  No change in mood or affect. No depression or anxiety. No memory loss.  Neuro: no localizing neurological complaints, no tingling, no weakness, no double vision, no gait abnormality, no slurred speech, no confusion  All systems reviewed and apart from [place] all are  negative  Past Medical History:   Past Medical History:  Diagnosis Date  . Anemia   . Anxiety   . Arthritis   . CAD (coronary artery disease) 2016   non-obstructive at cath  . Colon polyps 09/11/2010   Tubular adenoma  . COPD 12/14/2008   PATIENT DENIES    . Depression   . Diabetic coma ([place]) Feb. 2014  . Fall at home 2016   x 2 in January 2016, Left knee  . Fibromyalgia   . GERD 07/20/2006  . Headache(784.0)   . History of transient ischemic attack (TIA)   . HPV (human papilloma virus) infection   . Hyperlipidemia   . Hypertension   . Hypothyroidism   . Insulin dependent type 2 diabetes mellitus, controlled ([place]) 1989  . Lumbar disc disease   . Neuromuscular disorder ([place])    PERIPHERAL NEUROPATHY  . Peripheral vascular disease ([place])   . Personal history of malignant neoplasm of kidney(V10.52) 12/14/2008   Laparoscopic biopsy and cryoablation 7/08 Dr. Vernie Shanks   . Renal disorder    chronic kidney disease   . Stroke [facility])    3 MINI STROKES  RT SIDED WEAKNESS  . Vertigo       Past Surgical History:  Procedure Laterality Date  . ABDOMINAL AORTAGRAM N/A 08/21/2011   Procedure: ABDOMINAL Maxcine Ham;  Surgeon: Elam Dutch, MD;  Location: [facility] CATH LAB;  Service: Cardiovascular;  Laterality: N/A;  . ABDOMINAL AORTAGRAM N/A 03/16/2014   Procedure: ABDOMINAL Maxcine Ham;  Surgeon: Elam Dutch, MD;  Location: [facility] CATH LAB;  Service: Cardiovascular;  Laterality: N/A;  . ABDOMINAL HYSTERECTOMY    . APPENDECTOMY    . BLADDER SUSPENSION    . CARDIAC CATHETERIZATION    . CHOLECYSTECTOMY OPEN    . ESOPHAGOGASTRODUODENOSCOPY (EGD) WITH PROPOFOL N/A 05/16/2014   Procedure: ESOPHAGOGASTRODUODENOSCOPY (EGD) WITH PROPOFOL with Balloon dilation;  Surgeon: Milus Banister, MD;  Location: [place];  Service:  Endoscopy;  Laterality: N/A;  . ESOPHAGOGASTRODUODENOSCOPY (EGD) WITH PROPOFOL N/A 09/02/2015   Procedure: ESOPHAGOGASTRODUODENOSCOPY (EGD) WITH PROPOFOL;  Surgeon: Doran Stabler, MD;  Location: [place];  Service: Gastroenterology;  Laterality: N/A;  . FEMORAL-POPLITEAL BYPASS GRAFT  10/12/2011   Procedure: BYPASS GRAFT FEMORAL-POPLITEAL ARTERY;  Surgeon: Elam Dutch, MD;  Location: [facility] OR;  Service: Vascular;  Laterality: Left;  Left Femoral-Popliteal Bypass Graft using 53mm x 80cm Propaten Graft with intraop arteriogram times one.  . FEMORAL-POPLITEAL BYPASS GRAFT Left 04/17/2014   Procedure: REDO LEFT FEMORAL-POPLITEAL ARTERY BYPASS USING GORE PROPATEN 3mmx80cm GRAFT;  Surgeon: Elam Dutch, MD;  Location: [place];  Service: Vascular;  Laterality: Left;  . FOOT SURGERY Left    "bone spurs"  . INTRAOPERATIVE ARTERIOGRAM  10/12/2011   Procedure: INTRA OPERATIVE ARTERIOGRAM;  Surgeon: Elam Dutch, MD;  Location: [place];  Service: Vascular;  Laterality: Left;  . KNEE ARTHROSCOPY Bilateral    "cartilage"  . Laparascopic cryoablation of left kidney  08/2006   Dr. Gladis Riffle for renal cell cancer  . LEFT HEART CATHETERIZATION WITH CORONARY ANGIOGRAM N/A 05/11/2011   Procedure: LEFT HEART CATHETERIZATION WITH CORONARY ANGIOGRAM;  Surgeon: Jolaine Artist, MD;  Location: [facility] CATH LAB;  Service: Cardiovascular;  Laterality: N/A;  . LUNG SURGERY Right    lung nodule removed from the right side  . PR VEIN BYPASS GRAFT,AORTO-FEM-POP  05/27/2010  . SHOULDER ARTHROSCOPY Bilateral    'Spurs"  . TUBAL LIGATION      Social History:  Ambulatory   independently     reports that she has been smoking cigarettes. She has a 75.00 pack-year smoking history. She has never used smokeless tobacco. She reports that she does not drink alcohol or use drugs.     Family History:   Family History  Problem Relation Age of Onset  . Heart disease Mother   . Lung cancer Mother   . Cancer Mother        Lung  . Heart disease Father   . Lung cancer Father   . Cancer Father        Lung  . Heart disease Sister        CABG- Open Heart    Allergies: No Known  Allergies   Prior to Admission medications   Medication Sig Start Date End Date Taking? Authorizing Provider  aspirin EC 325 MG EC tablet Take 1 tablet (325 mg total) by mouth daily. 03/21/18  Yes Hongalgi, Lenis Dickinson, MD  cilostazol (PLETAL) 100 MG tablet TAKE 1 TABLET BY MOUTH TWICE A DAY BEFORE MEALS Patient taking differently: Take 100 mg by mouth 2 (two) times daily before a meal.  03/29/18  Yes Waynetta Sandy, MD  DULoxetine (CYMBALTA) 60 MG capsule Take 1 capsule (60 mg total) by mouth daily. 03/10/18  Yes Elayne Snare, MD  ferrous sulfate 325 (65 FE) MG tablet Take 325 mg by mouth daily with breakfast.    Yes [provider]  fludrocortisone (FLORINEF) 0.1 MG tablet Take 1 tablet (0.1 mg total) by mouth 3 (three) times a week. Patient taking differently: Take 0.1 mg by mouth See admin instructions. Take one tablet (0.1 mg) by mouth daily on Monday, Tuesday, Wednesday 1st week, then take one tablet (0.1 mg) on Thursday, Friday, Saturday 2nd week, then repeat 03/28/18  Yes Elayne Snare, MD  furosemide (LASIX) 20 MG tablet Take 1 tablet (20 mg total) by mouth daily. 03/20/18 03/20/19 Yes Hongalgi, Lenis Dickinson, MD  gabapentin (  NEURONTIN) 300 MG capsule Take 1 capsule (300 mg total) by mouth 2 (two) times daily. 02/24/18  Yes Elayne Snare, MD  HYDROcodone-acetaminophen (NORCO) 10-325 MG tablet Take 1 tablet by mouth 3 (three) times daily as needed (for pain). Follow up with PCP if refills needed 04/24/17  Yes Aline August, MD  hydrOXYzine (ATARAX/VISTARIL) 25 MG tablet Take 1 tablet (25 mg total) by mouth every 6 (six) hours. Patient taking differently: Take 25 mg by mouth at bedtime.  10/27/17  Yes Jacqlyn Larsen, PA-C  insulin lispro (HUMALOG) 100 UNIT/ML injection Use 70 units daily in insulin pump Patient taking differently: Inject 70 Units into the skin See admin instructions. Use 70 units daily in insulin pump 10/22/17  Yes Elayne Snare, MD  lansoprazole (PREVACID) 30 MG capsule Take 1  capsule (30 mg total) by mouth daily. 05/11/16  Yes Geradine Girt, DO  levothyroxine (SYNTHROID, LEVOTHROID) 150 MCG tablet Take 1 tablet (150 mcg total) by mouth daily. 03/10/18  Yes Elayne Snare, MD  metoprolol succinate (TOPROL-XL) 25 MG 24 hr tablet TAKE 1 TABLET BY MOUTH EVERY DAY Patient taking differently: Take 25 mg by mouth daily.  02/01/18  Yes Imogene Burn, PA-C  nortriptyline (PAMELOR) 75 MG capsule Take 75 mg by mouth at bedtime. 01/24/14  Yes [provider]  OXYGEN Inhale 1 L into the lungs as needed (shortness of breath).   Yes [provider]  polyethylene glycol (MIRALAX / GLYCOLAX) packet Take 17 g by mouth daily. Patient taking differently: Take 17 g by mouth daily as needed (constipation).  03/21/18  Yes Hongalgi, Lenis Dickinson, MD  potassium chloride (KLOR-CON 10) 10 MEQ tablet Take 1 tablet (10 mEq total) by mouth 2 (two) times daily. 02/24/18  Yes Elayne Snare, MD  PRESCRIPTION MEDICATION    Yes [provider]  QUEtiapine (SEROQUEL) 100 MG tablet Take 100 mg by mouth at bedtime. 09/25/16  Yes [provider]  senna (SENOKOT) 8.6 MG TABS tablet Take 2 tablets (17.2 mg total) by mouth daily. Patient taking differently: Take 1 tablet by mouth daily.  03/21/18  Yes Hongalgi, Lenis Dickinson, MD  tolterodine (DETROL LA) 4 MG 24 hr capsule Take 4 mg by mouth daily.    Yes [provider]  Vitamin D, Ergocalciferol, (DRISDOL) 50000 units CAPS capsule Take 50,000 Units by mouth every Monday.  09/18/16  Yes [provider]  ACCU-CHEK FASTCLIX LANCETS MISC Use 4 per day to check blood sugar dx code E11.9 11/29/14   Elayne Snare, MD  albuterol (PROVENTIL HFA;VENTOLIN HFA) 108 (90 Base) MCG/ACT inhaler Inhale 1-2 puffs into the lungs every 6 (six) hours as needed for wheezing or shortness of breath. Patient not taking: Reported on 04/02/2018 12/10/17   Kinnie Feil, PA-C  glucose blood (CONTOUR NEXT TEST) test strip Use to test blood sugar 3 times  daily E11.9 10/15/17   Elayne Snare, MD  Insulin Pen Needle (BD PEN NEEDLE NANO U/F) 32G X 4 MM MISC Use to inject insulin 10/30/14   Elayne Snare, MD  nicotine (NICODERM CQ - DOSED IN MG/24 HOURS) 14 mg/24hr patch Place 1 patch (14 mg total) onto the skin daily. Patient not taking: Reported on 04/02/2018 03/21/18   Modena Jansky, MD  predniSONE (DELTASONE) 10 MG tablet Take 3 tabs daily for 2 days, then 2 tabs daily for 3 days, then 1 tab daily for 3 days, then stop. Patient taking differently: Take 10-30 mg by mouth See admin instructions. Take 3 tabs (  30 mg) daily for 2 days, then 2 tabs (20 mg) daily for 3 days, then 1 tab (10 mg) daily for 3 days, then stop. 03/21/18   Hongalgi, Lenis Dickinson, MD  triamcinolone cream (KENALOG) 0.1 % Apply 1 application topically 2 (two) times daily. Patient not taking: Reported on 04/02/2018 12/10/17   Kinnie Feil, PA-C   Physical Exam: Blood pressure (!) 112/95, pulse 83, temperature 98.2 F (36.8 C), resp. rate 19, height 5\' 1"  (1.549 m), weight 76.2 kg, SpO2 95 %. 1. General:  in No  Acute distress    Chronically ill -appearing 2. Psychological: Alert and Oriented 3. Head/ENT:   Moist Mucous Membranes                          Head Non traumatic, neck supple                       Poor Dentition 4. SKIN: decreased Skin turgor,  Skin clean Dry and intact no rash 5. Heart: Regular rate and rhythm no  Murmur, no Rub or gallop 6. Lungs:  no wheezes some  crackles  Distant breath sounds 7. Abdomen: Soft, non-tender,   distended  obese  bowel sounds present 8. Lower extremities: no clubbing, cyanosis, no edema 9. Neurologically Grossly intact, moving all 4 extremities equally   10. MSK: Normal range of motion   LABS:     Recent Labs  Lab 04/02/18 1805 04/02/18 2048  WBC 15.1*  --   NEUTROABS 13.3*  --   HGB 9.5* 10.2*  HCT 31.0* 30.0*  MCV 84.5  --   PLT 341  --    Basic Metabolic Panel: Recent Labs  Lab 04/02/18 1805 04/02/18 2048  NA 136 138   K 4.5 4.2  CL 103  --   CO2 18*  --   GLUCOSE 242*  --   BUN 28*  --   CREATININE 2.61*  --   CALCIUM 8.7*  --       No results for input(s): AST, ALT, ALKPHOS, BILITOT, PROT, ALBUMIN in the last 168 hours. No results for input(s): LIPASE, AMYLASE in the last 168 hours. No results for input(s): AMMONIA in the last 168 hours.    HbA1C: No results for input(s): HGBA1C in the last 72 hours. CBG: No results for input(s): GLUCAP in the last 168 hours.    Urine analysis:    Component Value Date/Time   COLORURINE YELLOW 03/16/2018 1920   APPEARANCEUR HAZY (A) 03/16/2018 1920   LABSPEC 1.015 03/16/2018 1920   PHURINE 5.0 03/16/2018 1920   GLUCOSEU NEGATIVE 03/16/2018 1920   GLUCOSEU 250 (A) 08/17/2017 1411   HGBUR SMALL (A) 03/16/2018 1920   BILIRUBINUR NEGATIVE 03/16/2018 1920   KETONESUR NEGATIVE 03/16/2018 1920   PROTEINUR 30 (A) 03/16/2018 1920   UROBILINOGEN 0.2 08/17/2017 1411   NITRITE NEGATIVE 03/16/2018 1920   LEUKOCYTESUR NEGATIVE 03/16/2018 1920     Cultures:    Component Value Date/Time   SDES BLOOD RIGHT ANTECUBITAL 03/16/2018 2109   SPECREQUEST  03/16/2018 2109    BOTTLES DRAWN AEROBIC ONLY Blood Culture results may not be optimal due to an inadequate volume of blood received in culture bottles   CULT  03/16/2018 2109    NO GROWTH 5 DAYS Performed at [facility], [place] [address], [place], [place] [address]    REPTSTATUS 03/21/2018 FINAL 03/16/2018 2109     Radiological Exams on  Admission: Dg Chest Portable 1 View  Result Date: 04/02/2018 CLINICAL DATA:  Dyspnea, history hypertension, diabetes mellitus, COPD, coronary artery disease EXAM: PORTABLE CHEST 1 VIEW COMPARISON:  Portable exam 1738 hours compared to 03/19/2018 FINDINGS: Enlargement of cardiac silhouette. Atherosclerotic calcification aorta. Mediastinal contours and pulmonary vascularity normal. Scattered interstitial infiltrates bilaterally increased in RIGHT upper lobe since previous exam.  Improved infiltrates in LEFT upper lobe. This could represent waxing and waning edema or infection. Subsegmental atelectasis at lingula. No pleural effusion or pneumothorax. Bones demineralized. IMPRESSION: Scattered interstitial infiltrates, increased in RIGHT upper lobe and improved in LEFT upper lobe since previous exam question pulmonary edema versus infection. Enlargement of cardiac silhouette with subsegmental atelectasis in lingula. Electronically Signed   By: Lavonia Dana M.D.   On: 04/02/2018 18:15    Chart has been reviewed  Assessment/Plan   73 y.o. female with medical history significant of COPD anemia, anxiety, nonobstructive CAD, colon polyps, COPD, depression, fibromyalgia, GERD, headache, history of TIA, HPV, HLD, HTN, type 2 diabetes on insulin pump, peripheral neuropathy, peripheral vascular disease, history of renal cancer, history of vertigo      Admitted for acute on chronic respiratory failure  Present on Admission: . Acute on chronic respiratory failure with hypoxia (HCC) -clear multifactorial possibly COPD versus H CAP versus fluid overload.  If it from further imaging to evaluate underlying interstitial lung disease for now continue with BiPAP admit to stepdown appreciate pulmonology consult  . Hypothyroidism - - Check TSH continue home medications at current dose  . Essential hypertension -chronic continue home medications . CAD (coronary artery disease) -no chest pain on  beta-blocker at baseline . Hyperlipidemia -stable continue home medication . DISORDER, TOBACCO USE -currently states she quit . Chronic pain syndrome-continue home medications avoid IV narcotics . Acute kidney injury superimposed on CKD ([place]) -avoid nephrotoxic medications, check bladder scan she  had urinary retention in the past.  Gentle fluids and monitor   . chronic diastolic CHF (congestive heart failure) ([place]) unclear if CHF is contributing to hypoxia versus if it is more of an infectious  process no peripheral edema noted hold off Lasix for tonight and carefully reassess  . HCAP (healthcare-associated pneumonia) continue cefepime and vancomycin await results of sputum culture and appreciate pulmonology consult patient may have underlying interstitial lung disease unclear etiology  DM 2 on insulin pump will continue monitor blood sugars every 4 hours  Other plan as per orders.  DVT prophylaxis:    Lovenox     Code Status:  FULL CODE as per patient   I had personally discussed CODE STATUS with patient   Family Communication:   Family not at  Bedside     Disposition Plan:     To home once workup is complete and patient is stable                     Would benefit from PT/OT eval prior to DC  Ordered                    Consults called:   Pulmonology to see in AM  Admission status   inpatient     Expect 2 midnight stay secondary to severity of patient's current illness including   hemodynamic instability despite optimal treatment (    tachypnea hypoxia, )   and extensive comorbidities including:  Chronic pain     CHF   CAD  COPD   That are currently affecting medical management.  I expect  patient to be hospitalized for 2 midnights requiring inpatient medical care.  Patient is at high risk for adverse outcome (such as loss of life or disability) if not treated.  Indication for inpatient stay as follows:   status Hemodynamic instability despite maximal medical therapy,    New or worsening hypoxia  Need for IV antibiotics,       Level of care    SDU tele indefinitely please discontinue once patient no longer qualifies    Nikeshia Keetch 04/02/2018, 11:02 PM    [name]     after 2 AM please page floor coverage PA If 7AM-7PM, please contact the day team taking care of the patient using [email or web address]

## 2018-04-02 NOTE — ED Provider Notes (Signed)
[facility] EMERGENCY DEPARTMENT Provider Note   CSN: [id number] Arrival date & time:        History   Chief Complaint Chief Complaint  Patient presents with  . Shortness of Breath    HPI ASLIN FARINAS is a 73 y.o. female.  HPI   EMMARY CULBREATH is a 73 y.o. female with PMH of anemia, anxiety, nonobstructive CAD, colon polyps, COPD, depression, fibromyalgia, GERD, headache, history of TIA, HPV, HLD, HTN, type 2 diabetes on insulin pump, peripheral neuropathy, peripheral vascular disease, history of renal cancer, history of vertigo who presents from home via EMS with dyspnea.  She reports that she has been compliant with her medicines but was not discharged on any inhalers or nebulizers.  Is on a steroid taper that she states she was prescribed 3 days ago but is not sure who prescribed it.  States that was a physician at [facility] but has not had any follow-up visit since her discharge in January.  States that she has been compliant with her 1 L of nasal cannula oxygen at all times at home.  She was walking from her bedroom to the living room to take a nap and she got more short of breath.  No lightheadedness or chest pain.  No palpitations.  Was coughing but it was nonproductive.  Felt that she had had a panic attack which she states she has had multiple times before.  Was anxious.  Feels at her baseline now.  No fevers or chills.  Does not feel edematous or retaining fluid.  No urinary symptoms.  Eating and drinking normally.  Past Medical History:  Diagnosis Date  . Anemia   . Anxiety   . Arthritis   . CAD (coronary artery disease) 2016   non-obstructive at cath  . Colon polyps 09/11/2010   Tubular adenoma  . COPD 12/14/2008   PATIENT DENIES    . Depression   . Diabetic coma ([place]) Feb. 2014  . Fall at home 2016   x 2 in January 2016, Left knee  . Fibromyalgia   . GERD 07/20/2006  . Headache(784.0)   . History of transient ischemic attack (TIA)   .  HPV (human papilloma virus) infection   . Hyperlipidemia   . Hypertension   . Hypothyroidism   . Insulin dependent type 2 diabetes mellitus, controlled ([place]) 1989  . Lumbar disc disease   . Neuromuscular disorder ([place])    PERIPHERAL NEUROPATHY  . Peripheral vascular disease ([place])   . Personal history of malignant neoplasm of kidney(V10.52) 12/14/2008   Laparoscopic biopsy and cryoablation 7/08 Dr. Vernie Shanks   . Renal disorder    chronic kidney disease   . Stroke [facility])    3 MINI STROKES  RT SIDED WEAKNESS  . Vertigo     Patient Active Problem List   Diagnosis Date Noted  . Acute on chronic respiratory failure with hypoxia ([place]) 04/02/2018  . Acute on chronic diastolic CHF (congestive heart failure) ([place]) 04/02/2018  . HCAP (healthcare-associated pneumonia) 04/02/2018  . Acute pulmonary edema (HCC)   . Goals of care, counseling/discussion   . Wheezy bronchitis 03/16/2018  . Left-sided weakness 03/16/2018  . TIA (transient ischemic attack) 03/16/2018  . Acute respiratory failure ([place]) 03/16/2018  . Respiratory failure with hypoxia ([place]) 03/16/2018  . Dizziness 04/23/2017  . Hyperglycemia 10/31/2016  . Closed fracture of transverse process of lumbar vertebra ([place]) 10/31/2016  . Hematoma 10/31/2016  . Recurrent falls 10/31/2016  . Syncope 10/31/2016  .  Gait abnormality 10/19/2016  . Diabetic peripheral neuropathy ([place]) 10/19/2016  . Symptomatic bradycardia 10/13/2016  . Elevated troponin   . Bradycardia 10/12/2016  . General weakness 10/12/2016  . CKD (chronic kidney disease), stage III ([place]) 10/12/2016  . Weakness   . Orthostatic syncope 05/10/2016  . Colitis 05/10/2016  . Anemia 05/10/2016  . Hypomagnesemia 05/10/2016  . Food impaction of esophagus 09/02/2015  . Diabetic foot ulcer ([place]) 06/24/2015  . Atheroscler of bypass graft of left leg with intermittent claudication ([place]) 04/17/2014  . Carotid stenosis 05/19/2013  . Chest pain 09/21/2012  . Acute kidney injury  superimposed on CKD ([place]) 09/21/2012  . Chronic pain syndrome 03/13/2012  . Acute kidney injury ([place]) 03/13/2012  . Atherosclerosis of native artery of extremity with intermittent claudication ([place]) 10/08/2011  . PVD (peripheral vascular disease) ([place]) 07/30/2011  . CAD (coronary artery disease) 05/10/2011  . Hyperlipidemia   . Unstable angina ([place]) 05/09/2011  . CERVICAL CANCER 12/14/2008  . Personal history of malignant neoplasm of kidney(V10.52) 12/14/2008  . COPD (chronic obstructive pulmonary disease) ([place]) 12/14/2008  . IBS 12/14/2008  . Essential hypertension   . Personal history of colonic polyps 08/13/2008  . DISORDER, TOBACCO USE 08/04/2006  . Hypothyroidism   . Insulin dependent type 2 diabetes mellitus, controlled ([place])   . Lumbar disc disease   . ANXIETY STATE NOS 07/20/2006  . GERD 07/20/2006  . HX, PERSONAL, VENOUS THROMBOSIS/EMBOLISM 07/20/2006    Past Surgical History:  Procedure Laterality Date  . ABDOMINAL AORTAGRAM N/A 08/21/2011   Procedure: ABDOMINAL Maxcine Ham;  Surgeon: Elam Dutch, MD;  Location: [facility] CATH LAB;  Service: Cardiovascular;  Laterality: N/A;  . ABDOMINAL AORTAGRAM N/A 03/16/2014   Procedure: ABDOMINAL Maxcine Ham;  Surgeon: Elam Dutch, MD;  Location: [facility] CATH LAB;  Service: Cardiovascular;  Laterality: N/A;  . ABDOMINAL HYSTERECTOMY    . APPENDECTOMY    . BLADDER SUSPENSION    . CARDIAC CATHETERIZATION    . CHOLECYSTECTOMY OPEN    . ESOPHAGOGASTRODUODENOSCOPY (EGD) WITH PROPOFOL N/A 05/16/2014   Procedure: ESOPHAGOGASTRODUODENOSCOPY (EGD) WITH PROPOFOL with Balloon dilation;  Surgeon: Milus Banister, MD;  Location: [facility];  Service: Endoscopy;  Laterality: N/A;  . ESOPHAGOGASTRODUODENOSCOPY (EGD) WITH PROPOFOL N/A 09/02/2015   Procedure: ESOPHAGOGASTRODUODENOSCOPY (EGD) WITH PROPOFOL;  Surgeon: Doran Stabler, MD;  Location: [facility];  Service: Gastroenterology;  Laterality: N/A;  . FEMORAL-POPLITEAL BYPASS GRAFT  10/12/2011    Procedure: BYPASS GRAFT FEMORAL-POPLITEAL ARTERY;  Surgeon: Elam Dutch, MD;  Location[facility];  Service: Vascular;  Laterality: Left;  Left Femoral-Popliteal Bypass Graft using 62mm x 80cm Propaten Graft with intraop arteriogram times one.  . FEMORAL-POPLITEAL BYPASS GRAFT Left 04/17/2014   Procedure: REDO LEFT FEMORAL-POPLITEAL ARTERY BYPASS USING GORE PROPATEN 49mmx80cm GRAFT;  Surgeon: Elam Dutch, MD;  Location: [facility];  Service: Vascular;  Laterality: Left;  . FOOT SURGERY Left    "bone spurs"  . INTRAOPERATIVE ARTERIOGRAM  10/12/2011   Procedure: INTRA OPERATIVE ARTERIOGRAM;  Surgeon: Elam Dutch, MD;  Location: [facility];  Service: Vascular;  Laterality: Left;  . KNEE ARTHROSCOPY Bilateral    "cartilage"  . Laparascopic cryoablation of left kidney  08/2006   Dr. Gladis Riffle for renal cell cancer  . LEFT HEART CATHETERIZATION WITH CORONARY ANGIOGRAM N/A 05/11/2011   Procedure: LEFT HEART CATHETERIZATION WITH CORONARY ANGIOGRAM;  Surgeon: Jolaine Artist, MD;  Location: [facility] CATH LAB;  Service: Cardiovascular;  Laterality: N/A;  . LUNG SURGERY Right    lung nodule removed  from the right side  . PR VEIN BYPASS GRAFT,AORTO-FEM-POP  05/27/2010  . SHOULDER ARTHROSCOPY Bilateral    'Spurs"  . TUBAL LIGATION       OB History   No obstetric history on file.      Home Medications    Prior to Admission medications   Medication Sig Start Date End Date Taking? Authorizing Provider  aspirin EC 325 MG EC tablet Take 1 tablet (325 mg total) by mouth daily. 03/21/18  Yes Hongalgi, Lenis Dickinson, MD  cilostazol (PLETAL) 100 MG tablet TAKE 1 TABLET BY MOUTH TWICE A DAY BEFORE MEALS Patient taking differently: Take 100 mg by mouth 2 (two) times daily before a meal.  03/29/18  Yes Waynetta Sandy, MD  DULoxetine (CYMBALTA) 60 MG capsule Take 1 capsule (60 mg total) by mouth daily. 03/10/18  Yes Elayne Snare, MD  ferrous sulfate 325 (65 FE) MG tablet Take 325 mg by mouth daily with breakfast.     Yes [provider]  fludrocortisone (FLORINEF) 0.1 MG tablet Take 1 tablet (0.1 mg total) by mouth 3 (three) times a week. Patient taking differently: Take 0.1 mg by mouth See admin instructions. Take one tablet (0.1 mg) by mouth daily on Monday, Tuesday, Wednesday 1st week, then take one tablet (0.1 mg) on Thursday, Friday, Saturday 2nd week, then repeat 03/28/18  Yes Elayne Snare, MD  furosemide (LASIX) 20 MG tablet Take 1 tablet (20 mg total) by mouth daily. 03/20/18 03/20/19 Yes Hongalgi, Lenis Dickinson, MD  gabapentin (NEURONTIN) 300 MG capsule Take 1 capsule (300 mg total) by mouth 2 (two) times daily. 02/24/18  Yes Elayne Snare, MD  HYDROcodone-acetaminophen (NORCO) 10-325 MG tablet Take 1 tablet by mouth 3 (three) times daily as needed (for pain). Follow up with PCP if refills needed 04/24/17  Yes Aline [place], MD  hydrOXYzine (ATARAX/VISTARIL) 25 MG tablet Take 1 tablet (25 mg total) by mouth every 6 (six) hours. Patient taking differently: Take 25 mg by mouth at bedtime.  10/27/17  Yes Jacqlyn Larsen, PA-C  insulin lispro (HUMALOG) 100 UNIT/ML injection Use 70 units daily in insulin pump Patient taking differently: Inject 70 Units into the skin See admin instructions. Use 70 units daily in insulin pump 10/22/17  Yes Elayne Snare, MD  lansoprazole (PREVACID) 30 MG capsule Take 1 capsule (30 mg total) by mouth daily. 05/11/16  Yes Geradine Girt, DO  levothyroxine (SYNTHROID, LEVOTHROID) 150 MCG tablet Take 1 tablet (150 mcg total) by mouth daily. 03/10/18  Yes Elayne Snare, MD  metoprolol succinate (TOPROL-XL) 25 MG 24 hr tablet TAKE 1 TABLET BY MOUTH EVERY DAY Patient taking differently: Take 25 mg by mouth daily.  02/01/18  Yes Imogene Burn, PA-C  nortriptyline (PAMELOR) 75 MG capsule Take 75 mg by mouth at bedtime. 01/24/14  Yes [provider]  OXYGEN Inhale 1 L into the lungs as needed (shortness of breath).   Yes [provider]  polyethylene glycol (MIRALAX / GLYCOLAX)  packet Take 17 g by mouth daily. Patient taking differently: Take 17 g by mouth daily as needed (constipation).  03/21/18  Yes Hongalgi, Lenis Dickinson, MD  potassium chloride (KLOR-CON 10) 10 MEQ tablet Take 1 tablet (10 mEq total) by mouth 2 (two) times daily. 02/24/18  Yes Elayne Snare, MD  PRESCRIPTION MEDICATION    Yes [provider]  QUEtiapine (SEROQUEL) 100 MG tablet Take 100 mg by mouth at bedtime. 09/25/16  Yes [provider]  senna (SENOKOT) 8.6 MG TABS tablet  Take 2 tablets (17.2 mg total) by mouth daily. Patient taking differently: Take 1 tablet by mouth daily.  03/21/18  Yes Hongalgi, Lenis Dickinson, MD  tolterodine (DETROL LA) 4 MG 24 hr capsule Take 4 mg by mouth daily.    Yes [provider]  Vitamin D, Ergocalciferol, (DRISDOL) 50000 units CAPS capsule Take 50,000 Units by mouth every Monday.  09/18/16  Yes [provider]  ACCU-CHEK FASTCLIX LANCETS MISC Use 4 per day to check blood sugar dx code E11.9 11/29/14   Elayne Snare, MD  albuterol (PROVENTIL HFA;VENTOLIN HFA) 108 (90 Base) MCG/ACT inhaler Inhale 1-2 puffs into the lungs every 6 (six) hours as needed for wheezing or shortness of breath. Patient not taking: Reported on 04/02/2018 12/10/17   Kinnie Feil, PA-C  glucose blood (CONTOUR NEXT TEST) test strip Use to test blood sugar 3 times daily E11.9 10/15/17   Elayne Snare, MD  Insulin Pen Needle (BD PEN NEEDLE NANO U/F) 32G X 4 MM MISC Use to inject insulin 10/30/14   Elayne Snare, MD  nicotine (NICODERM CQ - DOSED IN MG/24 HOURS) 14 mg/24hr patch Place 1 patch (14 mg total) onto the skin daily. Patient not taking: Reported on 04/02/2018 03/21/18   Modena Jansky, MD  predniSONE (DELTASONE) 10 MG tablet Take 3 tabs daily for 2 days, then 2 tabs daily for 3 days, then 1 tab daily for 3 days, then stop. Patient taking differently: Take 10-30 mg by mouth See admin instructions. Take 3 tabs (30 mg) daily for 2 days, then 2 tabs (20 mg) daily for 3 days, then 1 tab  (10 mg) daily for 3 days, then stop. 03/21/18   Hongalgi, Lenis Dickinson, MD  triamcinolone cream (KENALOG) 0.1 % Apply 1 application topically 2 (two) times daily. Patient not taking: Reported on 04/02/2018 12/10/17   Kinnie Feil, PA-C    Family History Family History  Problem Relation Age of Onset  . Heart disease Mother   . Lung cancer Mother   . Cancer Mother        Lung  . Heart disease Father   . Lung cancer Father   . Cancer Father        Lung  . Heart disease Sister        CABG- Open Heart    Social History Social History   Tobacco Use  . Smoking status: Current Every Day Smoker    Packs/day: 1.50    Years: 50.00    Pack years: 75.00    Types: Cigarettes    Last attempt to quit: 06/24/2014    Years since quitting: 3.7  . Smokeless tobacco: Never Used  Substance Use Topics  . Alcohol use: No    Alcohol/week: 0.0 standard drinks  . Drug use: No     Allergies   Patient has no known allergies.   Review of Systems Review of Systems  Constitutional: Negative for chills and fever.  HENT: Negative for ear pain and sore throat.   Eyes: Negative for pain and visual disturbance.  Respiratory: Positive for cough, shortness of breath and wheezing.   Cardiovascular: Negative for chest pain and palpitations.  Gastrointestinal: Negative for abdominal pain and vomiting.  Genitourinary: Negative for dysuria and hematuria.  Musculoskeletal: Negative for arthralgias and back pain.  Skin: Negative for color change and rash.  Neurological: Negative for seizures and syncope.  All other systems reviewed and are negative.    Physical Exam Updated Vital Signs BP (!) 112/95  Pulse 83   Temp 98.2 F (36.8 C)   Resp 19   Ht 5\' 1"  (1.549 m)   Wt 76.2 kg   SpO2 95%   BMI 31.74 kg/m   Physical Exam Vitals signs and nursing note reviewed.  Constitutional:      General: She is not in acute distress.    Appearance: Normal appearance. She is well-developed. She is  ill-appearing (chronically ill appearing).     Interventions: Nasal cannula in place.  HENT:     Head: Normocephalic and atraumatic.  Eyes:     Conjunctiva/sclera: Conjunctivae normal.  Neck:     Musculoskeletal: Neck supple.  Cardiovascular:     Rate and Rhythm: Normal rate and regular rhythm.     Heart sounds: S1 normal and S2 normal. No murmur.  Pulmonary:     Effort: Pulmonary effort is normal. Tachypnea (high teens to low 20s (no distress)) present. No prolonged expiration or respiratory distress.     Breath sounds: Rhonchi (subtle BL, equal) present. No wheezing.  Abdominal:     Palpations: Abdomen is soft.     Tenderness: There is no abdominal tenderness.  Skin:    General: Skin is warm and dry.  Neurological:     Mental Status: She is alert.     GCS: GCS eye subscore is 4. GCS verbal subscore is 5. GCS motor subscore is 6.     Cranial Nerves: Cranial nerves are intact.     Sensory: Sensation is intact.     Motor: Motor function is intact.     Coordination: Coordination is intact.  Psychiatric:        Behavior: Behavior is cooperative.      ED Treatments / Results  Labs (all labs ordered are listed, but only abnormal results are displayed) Labs Reviewed  CBC WITH DIFFERENTIAL/PLATELET - Abnormal; Notable for the following components:      Result Value   WBC 15.1 (*)    RBC 3.67 (*)    Hemoglobin 9.5 (*)    HCT 31.0 (*)    MCH 25.9 (*)    Neutro Abs 13.3 (*)    Abs Immature Granulocytes 0.13 (*)    All other components within normal limits  BASIC METABOLIC PANEL - Abnormal; Notable for the following components:   CO2 18 (*)    Glucose, Bld 242 (*)    BUN 28 (*)    Creatinine, Ser 2.61 (*)    Calcium 8.7 (*)    GFR calc non Af Amer 18 (*)    GFR calc Af Amer 21 (*)    All other components within normal limits  BRAIN NATRIURETIC PEPTIDE - Abnormal; Notable for the following components:   B Natriuretic Peptide 331.9 (*)    All other components within normal  limits  POCT I-STAT EG7 - Abnormal; Notable for the following components:   pCO2, Ven 40.4 (*)    pO2, Ven 50.0 (*)    Bicarbonate 18.9 (*)    TCO2 20 (*)    Acid-base deficit 7.0 (*)    HCT 30.0 (*)    Hemoglobin 10.2 (*)    All other components within normal limits  POCT I-STAT 7, (LYTES, BLD GAS, ICA,H+H) - Abnormal; Notable for the following components:   pO2, Arterial 62.0 (*)    Bicarbonate 19.2 (*)    TCO2 20 (*)    Acid-base deficit 6.0 (*)    HCT 27.0 (*)    Hemoglobin 9.2 (*)    All  other components within normal limits  TROPONIN I  BLOOD GAS, VENOUS  BLOOD GAS, ARTERIAL  BASIC METABOLIC PANEL  TROPONIN I  TROPONIN I  RAPID URINE DRUG SCREEN, HOSP PERFORMED  URINALYSIS, ROUTINE W REFLEX MICROSCOPIC    EKG EKG Interpretation  Date/Time:  Saturday April 02 2018 17:21:45 EST Ventricular Rate:  83 PR Interval:    QRS Duration: 116 QT Interval:  382 QTC Calculation: 449 R Axis:   112 Text Interpretation:  Sinus rhythm Borderline prolonged PR interval IRBBB and LPFB No significant change since last tracing Confirmed by Lajean Saver [phone]) on 04/02/2018 5:26:20 PM   Radiology Dg Chest Portable 1 View  Result Date: 04/02/2018 CLINICAL DATA:  Dyspnea, history hypertension, diabetes mellitus, COPD, coronary artery disease EXAM: PORTABLE CHEST 1 VIEW COMPARISON:  Portable exam 1738 hours compared to 03/19/2018 FINDINGS: Enlargement of cardiac silhouette. Atherosclerotic calcification aorta. Mediastinal contours and pulmonary vascularity normal. Scattered interstitial infiltrates bilaterally increased in RIGHT upper lobe since previous exam. Improved infiltrates in LEFT upper lobe. This could represent waxing and waning edema or infection. Subsegmental atelectasis at lingula. No pleural effusion or pneumothorax. Bones demineralized. IMPRESSION: Scattered interstitial infiltrates, increased in RIGHT upper lobe and improved in LEFT upper lobe since previous exam question  pulmonary edema versus infection. Enlargement of cardiac silhouette with subsegmental atelectasis in lingula. Electronically Signed   By: Lavonia Dana M.D.   On: 04/02/2018 18:15    Procedures Procedures (including critical care time)  Medications Ordered in ED Medications  vancomycin (VANCOCIN) IVPB 1000 mg/200 mL premix (1,000 mg Intravenous New Bag/Given 04/03/18 0021)  vancomycin (VANCOCIN) 500 mg in sodium chloride 0.9 % 100 mL IVPB (500 mg Intravenous New Bag/Given 04/03/18 0023)  ceFEPIme (MAXIPIME) 1 g in sodium chloride 0.9 % 100 mL IVPB (has no administration in time range)  0.9 %  sodium chloride infusion (has no administration in time range)  ipratropium-albuterol (DUONEB) 0.5-2.5 (3) MG/3ML nebulizer solution 3 mL (3 mLs Nebulization Given 04/02/18 1754)  predniSONE (DELTASONE) tablet 40 mg (40 mg Oral Given 04/02/18 1913)  ipratropium-albuterol (DUONEB) 0.5-2.5 (3) MG/3ML nebulizer solution 3 mL (3 mLs Nebulization Given 04/02/18 2038)  methylPREDNISolone sodium succinate (SOLU-MEDROL) 125 mg/2 mL injection 125 mg (125 mg Intravenous Given 04/02/18 2137)  ceFEPIme (MAXIPIME) 2 g in sodium chloride 0.9 % 100 mL IVPB (0 g Intravenous Stopped 04/03/18 0025)     Initial Impression / Assessment and Plan / ED Course  I have reviewed the triage vital signs and the nursing notes.  Pertinent labs & imaging results that were available during my care of the patient were reviewed by me and considered in my medical decision making (see chart for details).     MDM:  Imaging: Chest x-ray shows scattered interstitial infiltrates increased in the right upper lobe and improved in the left upper lobe since previous exam question pulmonary edema versus infection.  Subsegmental atelectasis in the lingula.  ED Provider Interpretation of EKG: Sinus rhythm with a rate of 83 bpm, right axis deviation, no ST segment elevation or depression or pathologic T wave abnormalities.  No bundle-branch blocks (incomplete  right bundle).  No significant interval regularity.  Labs: BMP with a CO2 of 18 and creatinine up to 2.6 with BUN of 28 (baseline creatinine around 1.5-1.7), CBC with hemoglobin 9.5 which is baseline, white count of 15 decreased from prior, venous blood gas 7279 with PCO2 of 40 and bicarb 18.9, after about 1 hour of BiPAP ABG 7.3 6/33/62/19, troponin less than 0.03  On initial evaluation, patient appears ill. Afebrile and hemodynamically stable although tachypneic. Alert and oriented x4, pleasant, and cooperative.  Presents with dyspnea as above.  Chart review as patient was discharged on 03/20/2018 after admission for possible TIA.  However she was found to have hypoxic respiratory failure likely thought to be multifactorial secondary to pulmonary edema and COPD exacerbation.  They question underlying interstitial lung disease as well.  She did require BiPAP and critical care at that time although improved with steroids and nebs as well as IV diuresis.  Discharged on home oxygen at 1 L nasal cannula at that time which was new req't.   Echo on 03/17/2018 showed LVEF of 60 to 65% and no regional wall motion abnormalities.  Indeterminate for diastolic dysfunction.  On exam, patient initially had tachypnea and rhonchi but no other evidence for increased work of breathing and was conversational without dyspnea.  Improved after supplemental oxygen beyond her 1 L nasal cannula by EMS but satting well on 1 L nasal cannula at my time of evaluation.  EKG shows no notes for acute ischemia or arrhythmia and she has no evidence for gross volume overload.  BNP mildly elevated but close to her recent values.  Given her response to steroids and nebs during recent hospitalization where she also benefit from BiPAP suspect a component of acute on chronic COPD or other reactive airway disease.  Given DuoNeb and 40 mg of p.o. prednisone.  Chest x-ray showed improved overall picture compared to prior without clear evidence for  infectious etiology.  White count elevated but decreased compared to her priors and patient is on daily prednisone at home.  No fevers or subjective chills/sweats.  No other constitutional signs or symptoms and her dyspnea was acute in onset with improvement at this time.  Do not feel that antibiotics are indicated.  However, after patient was reassessed she had improved transiently and then subsequently worsened.  Dyspnea worsened with no chest pain.  Satting in around 88% on 4 L nasal cannula at that time with worsening tachypnea.  Lungs equal bilaterally with coarse rhonchi at that time.  No crackles.  Placed on BiPAP and immediately improved.  FiO2 rapidly down titrated and gas obtained as above.  She improved significantly after BiPAP and benefited much from positive pressure.  IV Solu-Medrol 125 mg given at that time.  Patient was admitted to the hospitalist service to the stepdown unit as a bounce back from recent admission.  Will hold Lasix at that time in the setting of AKI and minimal change in her BNP from baseline.  Bedside cardiogram performed to myself showed approximately normal mitral valve excursion and no evidence for RV dilation/RV strain.  IVC collapsible while on positive pressure.  Remained stable at time of transfer of care awaiting inpatient bed.  The plan for this patient was discussed with Dr. Ashok Cordia who voiced agreement and who oversaw evaluation and treatment of this patient.   The patient was fully informed and involved with the history taking, evaluation, workup including labs/images, and plan. The patient's concerns and questions were addressed to the patient's satisfaction and she expressed agreement with the plan to admit.    Final Clinical Impressions(s) / ED Diagnoses   Final diagnoses:  COPD exacerbation ([place])  Acute on chronic respiratory failure, unspecified whether with hypoxia or hypercapnia (HCC)  Shortness of breath    ED Discharge Orders    None         Kenzlie Disch, Rodena Goldmann, MD  04/03/18 0041    Lajean Saver, MD 04/03/18 1625

## 2018-04-02 NOTE — ED Triage Notes (Addendum)
Per EMS- pt here from home, c.o. shortness of breathe. Fired placed her on 15L NRB. She arrives on nasal cannula at 2. Pt was just in hospital for pneumonia and stroke, diagnosed with COPD. Pt did not go to her follow up appointment. She is supposed to wear 2 L O2 at all times. Pt was not on O2 when fire arrived. VSS 90% on 2L. CBG 156 she has an insulin pump.

## 2018-04-02 NOTE — Progress Notes (Addendum)
Pharmacy Antibiotic Note  April Branch is a 73 y.o. female admitted on 04/02/2018 with pneumonia.  Pharmacy has been consulted for cefepime and vancomycin dosing. History of CKD with SCr= 2.61 (recently ~1.5-1.9 ), CrCL ~ 20 -cefepime 2gm given today -vancomycin 1gm ordered  Plan: -Cefepime 1gm IV q24hr (next dose on 2/9) -Vancomycin 500mg  IV x1 (for total dose of 1500mg ) then will follow SCr trend -Will follow renal function, cultures and clinical progress    Height: 5\' 1"  (154.9 cm) Weight: 168 lb (76.2 kg) IBW/kg (Calculated) : 47.8  Temp (24hrs), Avg:98.2 F (36.8 C), Min:98.2 F (36.8 C), Max:98.2 F (36.8 C)  Recent Labs  Lab 04/02/18 1805  WBC 15.1*  CREATININE 2.61*    Estimated Creatinine Clearance: 18.7 mL/min (A) (by C-G formula based on SCr of 2.61 mg/dL (H)).    No Known Allergies  Antimicrobials this admission: 2/8 cefepime 2/8 vanc  Dose adjustments this admission:   Microbiology results:   Thank you for allowing pharmacy to be a part of this patient's care.  Hildred Laser, PharmD Clinical Pharmacist **Pharmacist phone directory can now be found on [email or web address] ([email or web address]).  Listed under [place].

## 2018-04-03 ENCOUNTER — Inpatient Hospital Stay (HOSPITAL_COMMUNITY): Payer: Medicare Other

## 2018-04-03 DIAGNOSIS — J9621 Acute and chronic respiratory failure with hypoxia: Secondary | ICD-10-CM

## 2018-04-03 DIAGNOSIS — I5033 Acute on chronic diastolic (congestive) heart failure: Secondary | ICD-10-CM

## 2018-04-03 LAB — RAPID URINE DRUG SCREEN, HOSP PERFORMED
Amphetamines: NOT DETECTED
Barbiturates: NOT DETECTED
Benzodiazepines: NOT DETECTED
Cocaine: NOT DETECTED
Opiates: POSITIVE — AB
Tetrahydrocannabinol: NOT DETECTED

## 2018-04-03 LAB — COMPREHENSIVE METABOLIC PANEL
ALBUMIN: 3 g/dL — AB (ref 3.5–5.0)
ALT: 13 U/L (ref 0–44)
AST: 13 U/L — ABNORMAL LOW (ref 15–41)
Alkaline Phosphatase: 107 U/L (ref 38–126)
Anion gap: 15 (ref 5–15)
BUN: 30 mg/dL — ABNORMAL HIGH (ref 8–23)
CO2: 17 mmol/L — ABNORMAL LOW (ref 22–32)
Calcium: 8.7 mg/dL — ABNORMAL LOW (ref 8.9–10.3)
Chloride: 104 mmol/L (ref 98–111)
Creatinine, Ser: 2.43 mg/dL — ABNORMAL HIGH (ref 0.44–1.00)
GFR calc Af Amer: 23 mL/min — ABNORMAL LOW (ref >60)
GFR calc non Af Amer: 19 mL/min — ABNORMAL LOW (ref >60)
Glucose, Bld: 270 mg/dL — ABNORMAL HIGH (ref 70–99)
Potassium: 4.5 mmol/L (ref 3.5–5.1)
Sodium: 136 mmol/L (ref 135–145)
Total Bilirubin: 0.5 mg/dL (ref 0.3–1.2)
Total Protein: 7.1 g/dL (ref 6.5–8.1)

## 2018-04-03 LAB — CBC
HCT: 29.5 % — ABNORMAL LOW (ref 36.0–46.0)
HEMOGLOBIN: 9.7 g/dL — AB (ref 12.0–15.0)
MCH: 26.9 pg (ref 26.0–34.0)
MCHC: 32.9 g/dL (ref 30.0–36.0)
MCV: 81.9 fL (ref 80.0–100.0)
Platelets: 356 10*3/uL (ref 150–400)
RBC: 3.6 MIL/uL — ABNORMAL LOW (ref 3.87–5.11)
RDW: 15.4 % (ref 11.5–15.5)
WBC: 16 10*3/uL — ABNORMAL HIGH (ref 4.0–10.5)
nRBC: 0 % (ref 0.0–0.2)

## 2018-04-03 LAB — MAGNESIUM: Magnesium: 1.6 mg/dL — ABNORMAL LOW (ref 1.7–2.4)

## 2018-04-03 LAB — BLOOD GAS, ARTERIAL
Acid-base deficit: 4.9 mmol/L — ABNORMAL HIGH (ref 0.0–2.0)
Bicarbonate: 19 mmol/L — ABNORMAL LOW (ref 20.0–28.0)
Delivery systems: POSITIVE
Drawn by: 105521
EXPIRATORY PAP: 6
FIO2: 40
Inspiratory PAP: 12
O2 Saturation: 92.5 %
PATIENT TEMPERATURE: 98.6
RATE: 8 resp/min
pCO2 arterial: 30.8 mmHg — ABNORMAL LOW (ref 32.0–48.0)
pH, Arterial: 7.406 (ref 7.350–7.450)
pO2, Arterial: 66.5 mmHg — ABNORMAL LOW (ref 83.0–108.0)

## 2018-04-03 LAB — GLUCOSE, CAPILLARY
Glucose-Capillary: 170 mg/dL — ABNORMAL HIGH (ref 70–99)
Glucose-Capillary: 203 mg/dL — ABNORMAL HIGH (ref 70–99)
Glucose-Capillary: 211 mg/dL — ABNORMAL HIGH (ref 70–99)
Glucose-Capillary: 236 mg/dL — ABNORMAL HIGH (ref 70–99)
Glucose-Capillary: 255 mg/dL — ABNORMAL HIGH (ref 70–99)
Glucose-Capillary: 295 mg/dL — ABNORMAL HIGH (ref 70–99)
Glucose-Capillary: 295 mg/dL — ABNORMAL HIGH (ref 70–99)

## 2018-04-03 LAB — TROPONIN I: Troponin I: <0.03 ng/mL (ref <0.03)

## 2018-04-03 LAB — TSH: TSH: 0.688 u[IU]/mL (ref 0.350–4.500)

## 2018-04-03 LAB — URINALYSIS, ROUTINE W REFLEX MICROSCOPIC
Bilirubin Urine: NEGATIVE
Glucose, UA: NEGATIVE mg/dL
Ketones, ur: NEGATIVE mg/dL
LEUKOCYTES UA: NEGATIVE
Nitrite: NEGATIVE
Protein, ur: NEGATIVE mg/dL
Specific Gravity, Urine: 1.015 (ref 1.005–1.030)
pH: 5 (ref 5.0–8.0)

## 2018-04-03 LAB — STREP PNEUMONIAE URINARY ANTIGEN: Strep Pneumo Urinary Antigen: NEGATIVE

## 2018-04-03 LAB — HEMOGLOBIN A1C
Hgb A1c MFr Bld: 7.9 % — ABNORMAL HIGH (ref 4.8–5.6)
Mean Plasma Glucose: 180.03 mg/dL

## 2018-04-03 LAB — HIV ANTIBODY (ROUTINE TESTING W REFLEX): HIV Screen 4th Generation wRfx: NONREACTIVE

## 2018-04-03 LAB — MRSA PCR SCREENING: MRSA BY PCR: NEGATIVE

## 2018-04-03 LAB — PHOSPHORUS: Phosphorus: 4.7 mg/dL — ABNORMAL HIGH (ref 2.5–4.6)

## 2018-04-03 LAB — PROCALCITONIN

## 2018-04-03 LAB — D-DIMER, QUANTITATIVE: D-Dimer, Quant: 1.95 ug/mL-FEU — ABNORMAL HIGH (ref 0.00–0.50)

## 2018-04-03 MED ORDER — ONDANSETRON HCL 4 MG/2ML IJ SOLN: 4.0000 mg | Freq: Four times a day (QID) | INTRAMUSCULAR | Status: DC | PRN

## 2018-04-03 MED ORDER — SODIUM CHLORIDE 0.9% FLUSH: 3.0000 mL | INTRAVENOUS | Status: DC | PRN

## 2018-04-03 MED ORDER — ONDANSETRON HCL 4 MG PO TABS: 4.0000 mg | ORAL_TABLET | Freq: Four times a day (QID) | ORAL | Status: DC | PRN

## 2018-04-03 MED ORDER — PREDNISONE 20 MG PO TABS
40.0000 mg | ORAL_TABLET | Freq: Every day | ORAL | Status: AC
  Administered 2018-04-04 – 2018-04-07 (×4): 40 mg via ORAL
  Filled 2018-04-03 (×5): qty 2

## 2018-04-03 MED ORDER — CILOSTAZOL 100 MG PO TABS
100.0000 mg | ORAL_TABLET | Freq: Two times a day (BID) | ORAL | Status: DC
  Administered 2018-04-03 – 2018-04-07 (×10): 100 mg via ORAL
  Filled 2018-04-03 (×10): qty 1

## 2018-04-03 MED ORDER — LEVOTHYROXINE SODIUM 25 MCG PO TABS
125.0000 ug | ORAL_TABLET | Freq: Every day | ORAL | Status: DC
  Administered 2018-04-03 – 2018-04-07 (×4): 125 ug via ORAL
  Filled 2018-04-03 (×5): qty 1

## 2018-04-03 MED ORDER — IPRATROPIUM-ALBUTEROL 0.5-2.5 (3) MG/3ML IN SOLN
3.0000 mL | Freq: Four times a day (QID) | RESPIRATORY_TRACT | Status: DC
  Administered 2018-04-03 – 2018-04-06 (×13): 3 mL via RESPIRATORY_TRACT
  Filled 2018-04-03 (×13): qty 3
  Filled 2018-04-03: qty 30

## 2018-04-03 MED ORDER — SODIUM CHLORIDE 0.9 % IV SOLN: 250.0000 mL | INTRAVENOUS | Status: DC | PRN

## 2018-04-03 MED ORDER — MAGNESIUM SULFATE 2 GM/50ML IV SOLN
2.0000 g | Freq: Once | INTRAVENOUS | Status: AC
  Administered 2018-04-03: 2 g via INTRAVENOUS
  Filled 2018-04-03: qty 50

## 2018-04-03 MED ORDER — NORTRIPTYLINE HCL 25 MG PO CAPS
75.0000 mg | ORAL_CAPSULE | Freq: Every day | ORAL | Status: DC
  Administered 2018-04-03 – 2018-04-06 (×4): 75 mg via ORAL
  Filled 2018-04-03 (×5): qty 3

## 2018-04-03 MED ORDER — METHYLPREDNISOLONE SODIUM SUCC 125 MG IJ SOLR
60.0000 mg | Freq: Two times a day (BID) | INTRAMUSCULAR | Status: AC
  Administered 2018-04-03 (×2): 60 mg via INTRAVENOUS
  Filled 2018-04-03 (×2): qty 2

## 2018-04-03 MED ORDER — METOPROLOL SUCCINATE ER 25 MG PO TB24
25.0000 mg | ORAL_TABLET | Freq: Every day | ORAL | Status: DC
  Administered 2018-04-03 – 2018-04-07 (×5): 25 mg via ORAL
  Filled 2018-04-03 (×6): qty 1

## 2018-04-03 MED ORDER — ACETAMINOPHEN 325 MG PO TABS
650.0000 mg | ORAL_TABLET | Freq: Four times a day (QID) | ORAL | Status: DC | PRN
  Filled 2018-04-03: qty 2

## 2018-04-03 MED ORDER — SODIUM CHLORIDE 0.9% FLUSH
3.0000 mL | Freq: Two times a day (BID) | INTRAVENOUS | Status: DC
  Administered 2018-04-03 – 2018-04-07 (×8): 3 mL via INTRAVENOUS

## 2018-04-03 MED ORDER — GUAIFENESIN ER 600 MG PO TB12
600.0000 mg | ORAL_TABLET | Freq: Two times a day (BID) | ORAL | Status: DC
  Administered 2018-04-03 – 2018-04-07 (×9): 600 mg via ORAL
  Filled 2018-04-03 (×10): qty 1

## 2018-04-03 MED ORDER — NICOTINE 14 MG/24HR TD PT24
14.0000 mg | MEDICATED_PATCH | Freq: Every day | TRANSDERMAL | Status: DC
  Administered 2018-04-03 – 2018-04-07 (×5): 14 mg via TRANSDERMAL
  Filled 2018-04-03 (×5): qty 1

## 2018-04-03 MED ORDER — QUETIAPINE FUMARATE 100 MG PO TABS
100.0000 mg | ORAL_TABLET | Freq: Every day | ORAL | Status: DC
  Administered 2018-04-03 – 2018-04-06 (×4): 100 mg via ORAL
  Filled 2018-04-03 (×4): qty 1

## 2018-04-03 MED ORDER — FLUDROCORTISONE ACETATE 0.1 MG PO TABS
0.1000 mg | ORAL_TABLET | ORAL | Status: DC
  Administered 2018-04-04 – 2018-04-06 (×3): 0.1 mg via ORAL
  Filled 2018-04-03 (×3): qty 1

## 2018-04-03 MED ORDER — FLUTICASONE FUROATE-VILANTEROL 100-25 MCG/INH IN AEPB
1.0000 | INHALATION_SPRAY | Freq: Every day | RESPIRATORY_TRACT | Status: DC
  Administered 2018-04-03 – 2018-04-07 (×4): 1 via RESPIRATORY_TRACT
  Filled 2018-04-03: qty 28

## 2018-04-03 MED ORDER — ACETAMINOPHEN 650 MG RE SUPP: 650.0000 mg | Freq: Four times a day (QID) | RECTAL | Status: DC | PRN

## 2018-04-03 MED ORDER — LEVALBUTEROL HCL 0.63 MG/3ML IN NEBU: 0.6300 mg | INHALATION_SOLUTION | Freq: Four times a day (QID) | RESPIRATORY_TRACT | Status: DC | PRN

## 2018-04-03 MED ORDER — FUROSEMIDE 20 MG PO TABS
20.0000 mg | ORAL_TABLET | Freq: Every day | ORAL | Status: DC
  Administered 2018-04-03 – 2018-04-07 (×5): 20 mg via ORAL
  Filled 2018-04-03 (×6): qty 1

## 2018-04-03 MED ORDER — GABAPENTIN 300 MG PO CAPS
300.0000 mg | ORAL_CAPSULE | Freq: Two times a day (BID) | ORAL | Status: DC
  Administered 2018-04-03 – 2018-04-07 (×9): 300 mg via ORAL
  Filled 2018-04-03 (×10): qty 1

## 2018-04-03 MED ORDER — INSULIN ASPART 100 UNIT/ML ~~LOC~~ SOLN
0.0000 [IU] | SUBCUTANEOUS | Status: DC
  Administered 2018-04-03: 5 [IU] via SUBCUTANEOUS
  Administered 2018-04-03 (×2): 3 [IU] via SUBCUTANEOUS
  Administered 2018-04-03: 5 [IU] via SUBCUTANEOUS
  Administered 2018-04-03: 3 [IU] via SUBCUTANEOUS
  Administered 2018-04-04: 7 [IU] via SUBCUTANEOUS
  Administered 2018-04-04: 5 [IU] via SUBCUTANEOUS
  Administered 2018-04-04: 2 [IU] via SUBCUTANEOUS
  Administered 2018-04-04 (×2): 3 [IU] via SUBCUTANEOUS
  Administered 2018-04-04: 5 [IU] via SUBCUTANEOUS
  Administered 2018-04-05: 1 [IU] via SUBCUTANEOUS
  Administered 2018-04-05: 3 [IU] via SUBCUTANEOUS
  Administered 2018-04-05: 2 [IU] via SUBCUTANEOUS
  Administered 2018-04-05 (×2): 3 [IU] via SUBCUTANEOUS
  Administered 2018-04-06: 2 [IU] via SUBCUTANEOUS
  Administered 2018-04-06 (×2): 1 [IU] via SUBCUTANEOUS
  Administered 2018-04-06 (×2): 7 [IU] via SUBCUTANEOUS
  Administered 2018-04-06: 5 [IU] via SUBCUTANEOUS
  Administered 2018-04-07 (×2): 3 [IU] via SUBCUTANEOUS
  Administered 2018-04-07: 2 [IU] via SUBCUTANEOUS
  Administered 2018-04-07: 9 [IU] via SUBCUTANEOUS
  Administered 2018-04-07: 3 [IU] via SUBCUTANEOUS

## 2018-04-03 MED ORDER — ENOXAPARIN SODIUM 30 MG/0.3ML ~~LOC~~ SOLN
30.0000 mg | SUBCUTANEOUS | Status: DC
  Administered 2018-04-03 – 2018-04-07 (×5): 30 mg via SUBCUTANEOUS
  Filled 2018-04-03 (×5): qty 0.3

## 2018-04-03 MED ORDER — HYDROCODONE-ACETAMINOPHEN 10-325 MG PO TABS
1.0000 | ORAL_TABLET | ORAL | Status: DC | PRN
  Administered 2018-04-03 – 2018-04-07 (×8): 1 via ORAL
  Filled 2018-04-03 (×10): qty 1

## 2018-04-03 MED ORDER — DULOXETINE HCL 60 MG PO CPEP
60.0000 mg | ORAL_CAPSULE | Freq: Every day | ORAL | Status: DC
  Administered 2018-04-03 – 2018-04-07 (×5): 60 mg via ORAL
  Filled 2018-04-03 (×5): qty 1

## 2018-04-03 MED ORDER — HYDROCODONE-ACETAMINOPHEN 5-325 MG PO TABS: 1.0000 | ORAL_TABLET | ORAL | Status: DC | PRN

## 2018-04-03 MED ORDER — ASPIRIN EC 325 MG PO TBEC
325.0000 mg | DELAYED_RELEASE_TABLET | Freq: Every day | ORAL | Status: DC
  Administered 2018-04-03 – 2018-04-07 (×5): 325 mg via ORAL
  Filled 2018-04-03 (×5): qty 1

## 2018-04-03 MED ORDER — MORPHINE SULFATE (PF) 2 MG/ML IV SOLN
2.0000 mg | INTRAVENOUS | Status: DC | PRN
  Administered 2018-04-03 – 2018-04-04 (×4): 2 mg via INTRAVENOUS
  Filled 2018-04-03 (×4): qty 1

## 2018-04-03 MED ORDER — FESOTERODINE FUMARATE ER 4 MG PO TB24
4.0000 mg | ORAL_TABLET | Freq: Every day | ORAL | Status: DC
  Administered 2018-04-03 – 2018-04-04 (×2): 4 mg via ORAL
  Filled 2018-04-03 (×2): qty 1

## 2018-04-03 MED ORDER — HYDROCODONE-ACETAMINOPHEN 10-325 MG PO TABS: 1.0000 | ORAL_TABLET | Freq: Three times a day (TID) | ORAL | Status: DC | PRN

## 2018-04-03 MED ORDER — INSULIN PUMP
SUBCUTANEOUS | Status: DC
  Filled 2018-04-03: qty 1

## 2018-04-03 NOTE — Progress Notes (Signed)
Pt received from ED to [facility]-26. RT came with patient as patient is on BiPaP continuous per order. Pt given CHG bath, telemetry applied and assessments performed. Pt. Oriented to room. Call bell given to patient and bed in lowest position. Will continue to monitor. Lajoyce Corners, RN

## 2018-04-03 NOTE — Progress Notes (Signed)
Pt transported from ED to 4E 26 without event.

## 2018-04-03 NOTE — Progress Notes (Signed)
PT Cancellation Note  Patient Details Name: ANAROSA KUBISIAK MRN: [id number] DOB: May 25, 1945   Cancelled Treatment:    Reason Eval/Treat Not Completed: Medical issues which prohibited therapy. Pt on continuous bipap. PT to re-attempt when med status improves.   Lorriane Shire 04/03/2018, 9:52 AM  Lorrin Goodell, PT  Office # [phone] Pager [phone]

## 2018-04-03 NOTE — Progress Notes (Signed)
Patient informed me that she takes her meds with applesauce. Respiratory contacted and they suggested HFNC for oxygenation. Patient was briefly able to come off BiPap to HFNC at 7L. At about 0720, patient's saturation level began to drop into the mid-80s. Patient placed back on BiPaP. Sats are in the low 90s now. Day shift nurse made aware of return to BiPaP. Lajoyce Corners, RN

## 2018-04-03 NOTE — Progress Notes (Addendum)
PROGRESS NOTE  April Branch [id number]:[id number] DOB: June 18, 1945 DOA: 04/02/2018 PCP: [name], MD  HPI/Recap of past 24 hours:  02 dropped to the 70's on 7liters high flow nasal cannual, she was put back on bipap Remain on bipap, no fever, she denies chest pain, + dry cough  She report weight gain , base weight is 156, she weight 168 this admission, she reports legs swell all the time, no edema on exam this am  150cc urine output documented since admission last night  She reports was sent home on home o2 and night time bipap after last hospitalization She reports missed her appointment with pulmonology on 2/6  Assessment/Plan: Active Problems:   Hypothyroidism   Insulin dependent type 2 diabetes mellitus, controlled ([place])   DISORDER, TOBACCO USE   Essential hypertension   CAD (coronary artery disease)   Hyperlipidemia   Chronic pain syndrome   Acute kidney injury superimposed on CKD (HCC)   Respiratory failure with hypoxia ([place])   Acute on chronic respiratory failure with hypoxia ([place])   Acute on chronic diastolic CHF (congestive heart failure) ([place])   HCAP (healthcare-associated pneumonia)  Acute on chronic hypoxic respiratory failure -persistent bilateral pulmonary infiltrate, no fever, does has leukocytosis, though she is aaox3, does not appear septic, troponin negative, abg with normal ph, no co2 retention.  -ordered  mrsa screening, respiratory viral panel, urine strep pneumo antigen, sputum culture, pending collection procalcitonin level,  Will also get ana and anca -ct chest ordered as well, not done yet. Will consider pulm consult pending CT chest. -continue nebs/steroids/abx for now  AKI on CKDIII with mild metabolic acidosis -cr baseline 1.5 -cr on presentation is 2.6 -ua ordered,  pending collection  H/o diastolic CHF Hard to assess volume She does not have leg edema, but has bilateral lung infiltrate and wight gain She is recently started on lasix from  last discharge  HTN; continue toprol  Hypomagnesemia: Replace mag, keep mag >2   Insulin dependent dm2 a1c 7.9  On insulin pump at home , blood glucose likely will be elevated while on iv steroids  H/o COPD: recently started on home o2,  Currently no wheezing She reports quick smoking several weeks ago  h/o CVA/TIA with mild left hemiparesis -on asa 325, betablocker , not on statin, (LDL 64)  H/o PAD on pletal  H/o renal cancer? Vs adrenal mass  On chronic florinef, unclear detail,she is followed with endocrinology regularly, her next appoint with  Endocrinology Dr [name] on 2/12   Hypothyrodiism: Continue synthroid  Code Status: DNR, confirmed with patient  Family Communication: patient   Disposition Plan: remain in stepdown   Consultants:  none  Procedures:  bipap  Antibiotics:  vanc and cefepime on presentation   Objective: BP 132/61 (BP Location: Right Arm)   Pulse 96   Temp 98.5 F (36.9 C) (Axillary)   Resp (!) 24   Ht 5\' 1"  (1.549 m)   Wt 76.2 kg   SpO2 91%   BMI 31.74 kg/m   Intake/Output Summary (Last 24 hours) at 04/03/2018 0815 Last data filed at 04/03/2018 0645 Gross per 24 hour  Intake 3 ml  Output 150 ml  Net -147 ml   Filed Weights   04/02/18 1720  Weight: 76.2 kg    Exam: Patient is examined daily including today on 04/03/2018, exams remain the same as of yesterday except that has changed    General:  NAD  Cardiovascular: RRR  Respiratory: coarse, no wheezing  Abdomen:  Soft/ND/NT, positive BS  Musculoskeletal: No Edema  Neuro: alert, oriented   Data Reviewed: Basic Metabolic Panel: Recent Labs  Lab 04/02/18 1805 04/02/18 2048 04/02/18 2151 04/03/18 0407  NA 136 138 136 136  K 4.5 4.2 4.3 4.5  CL 103  --   --  104  CO2 18*  --   --  17*  GLUCOSE 242*  --   --  270*  BUN 28*  --   --  30*  CREATININE 2.61*  --   --  2.43*  CALCIUM 8.7*  --   --  8.7*  MG  --   --   --  1.6*  PHOS  --   --   --  4.7*    Liver Function Tests: Recent Labs  Lab 04/03/18 0407  AST 13*  ALT 13  ALKPHOS 107  BILITOT 0.5  PROT 7.1  ALBUMIN 3.0*   No results for input(s): LIPASE, AMYLASE in the last 168 hours. No results for input(s): AMMONIA in the last 168 hours. CBC: Recent Labs  Lab 04/02/18 1805 04/02/18 2048 04/02/18 2151 04/03/18 0407  WBC 15.1*  --   --  16.0*  NEUTROABS 13.3*  --   --   --   HGB 9.5* 10.2* 9.2* 9.7*  HCT 31.0* 30.0* 27.0* 29.5*  MCV 84.5  --   --  81.9  PLT 341  --   --  356   Cardiac Enzymes:   Recent Labs  Lab 04/02/18 2202 04/03/18 0407  TROPONINI <0.03 <0.03   BNP (last 3 results) Recent Labs    03/16/18 0911 04/02/18 1805  BNP 279.6* 331.9*    ProBNP (last 3 results) No results for input(s): PROBNP in the last 8760 hours.  CBG: Recent Labs  Lab 04/03/18 0513 04/03/18 0812  GLUCAP 255* 211*    No results found for this or any previous visit (from the past 240 hour(s)).   Studies: Dg Chest Portable 1 View  Result Date: 04/02/2018 CLINICAL DATA:  Dyspnea, history hypertension, diabetes mellitus, COPD, coronary artery disease EXAM: PORTABLE CHEST 1 VIEW COMPARISON:  Portable exam 1738 hours compared to 03/19/2018 FINDINGS: Enlargement of cardiac silhouette. Atherosclerotic calcification aorta. Mediastinal contours and pulmonary vascularity normal. Scattered interstitial infiltrates bilaterally increased in RIGHT upper lobe since previous exam. Improved infiltrates in LEFT upper lobe. This could represent waxing and waning edema or infection. Subsegmental atelectasis at lingula. No pleural effusion or pneumothorax. Bones demineralized. IMPRESSION: Scattered interstitial infiltrates, increased in RIGHT upper lobe and improved in LEFT upper lobe since previous exam question pulmonary edema versus infection. Enlargement of cardiac silhouette with subsegmental atelectasis in lingula. Electronically Signed   By: Lavonia Dana M.D.   On: 04/02/2018 18:15     Scheduled Meds: . aspirin  325 mg Oral Daily  . cilostazol  100 mg Oral BID AC  . DULoxetine  60 mg Oral Daily  . enoxaparin (LOVENOX) injection  30 mg Subcutaneous Q24H  . fesoterodine  4 mg Oral Daily  . [START ON 04/04/2018] fludrocortisone  0.1 mg Oral Once per day on Mon Tue Wed  . fluticasone furoate-vilanterol  1 puff Inhalation Daily  . furosemide  20 mg Oral Daily  . gabapentin  300 mg Oral BID  . guaiFENesin  600 mg Oral BID  . insulin aspart  0-9 Units Subcutaneous Q4H  . insulin pump   Subcutaneous Q4H  . ipratropium-albuterol  3 mL Nebulization QID  . levothyroxine  125 mcg Oral QAC breakfast  .  methylPREDNISolone (SOLU-MEDROL) injection  60 mg Intravenous Q12H   Followed by  . [START ON 04/04/2018] predniSONE  40 mg Oral Q breakfast  . metoprolol succinate  25 mg Oral Daily  . nortriptyline  75 mg Oral QHS  . QUEtiapine  100 mg Oral QHS  . sodium chloride flush  3 mL Intravenous Q12H    Continuous Infusions: . sodium chloride    . sodium chloride 75 mL/hr at 04/03/18 0346  . ceFEPime (MAXIPIME) IV       Time spent: 62mins I have personally reviewed and interpreted on  04/03/2018 daily labs, tele strips, imagings as discussed above under date review session and assessment and plans.  I reviewed all nursing notes, pharmacy notes,  vitals, pertinent old records  I have discussed plan of care as described above with RN , patient  on 04/03/2018   Florencia Reasons MD, PhD  [name] Pager [phone]. If 7PM-7AM, please contact night-coverage at [email or web address], password [facility] 04/03/2018, 8:15 AM  LOS: 1 day

## 2018-04-03 NOTE — Consult Note (Addendum)
Name: April Branch MRN: [id number] DOB: 04/27/1945    ADMISSION DATE:  04/02/2018 CONSULTATION DATE:  04/03/2018   REFERRING MD :  Marvis Repress  CHIEF COMPLAINT:  Hypoxia, resp distress  BRIEF PATIENT DESCRIPTION: 73 year old smoker with recent admission 1/22 to 1/26 for bilateral infiltrates and hypoxia that responded to diuresis and steroids, readmitted with sudden onset shortness of breath and bilateral infiltrates  SIGNIFICANT EVENTS  Admit 1/22-1/26  STUDIES:  Echo 02/2017-normal LV function, diastolic dysfunction could not be assessed Echo 10/2016 -mild focal basal septal hypertrophy   HISTORY OF PRESENT ILLNESS: 73 year old smoker, hypertensive and diabetic was admitted 1/22 to 1/26 for left-sided weakness and inability to walk.  She has a prior history of CVA with residual left hemiparesis.  Chest x-ray was noted to have bilateral infiltrates, she required BiPAP initially and follow-up chest x-ray on 1/23 shows some improvement of infiltrates, personally reviewed.  She was treated with IV Solu-Medrol, respiratory viral panel was negative , she seemed to improve with Lasix and oxygen requirements decreased from 4 L to 1 L rather quickly, she was discharged on 1 L oxygen but plan to follow-up with pulmonary office on 2/6.  Of note BNP was normal but she was still labeled as having acute on chronic diastolic heart failure.  She was unable to keep follow-up with pulmonary office.  Developed sudden onset shortness of breath overnight on 2/8, went to bed feeling okay and suddenly woke up feeling short of breath which is not relieved by oxygen, came into the emergency room with chest x-ray again showed worsening bilateral infiltrates predominantly in both upper lobes, BNP was 332, mild leukocytosis, she was on 30 mg of prednisone.  She required BiPAP overnight and has transitioned to high flow nasal cannula this morning. On my arrival, saturation was 100% on 15 L high flow nasal cannula and  she was able to speak in full sentences and provide me the story  PAST MEDICAL HISTORY :   has a past medical history of Anemia, Anxiety, Arthritis, CAD (coronary artery disease) (2016), Colon polyps (09/11/2010), COPD (12/14/2008), Depression, Diabetic coma ([place]) (Feb. 2014), Fall at home (2016), Fibromyalgia, GERD (07/20/2006), Headache(784.0), History of transient ischemic attack (TIA), HPV (human papilloma virus) infection, Hyperlipidemia, Hypertension, Hypothyroidism, Insulin dependent type 2 diabetes mellitus, controlled ([place]) (1989), Lumbar disc disease, Neuromuscular disorder ([place]), Peripheral vascular disease ([place]), Personal history of malignant neoplasm of kidney(V10.52) (12/14/2008), Renal disorder, Stroke ([place]), and Vertigo.  has a past surgical history that includes Appendectomy; Abdominal hysterectomy; Knee arthroscopy (Bilateral); Lung surgery (Right); Laparascopic cryoablation of left kidney (08/2006); Bladder suspension; Shoulder arthroscopy (Bilateral); Foot surgery (Left); pr vein bypass graft,aorto-fem-pop (05/27/2010); Cardiac catheterization; Femoral-popliteal Bypass Graft (10/12/2011); Intraoperative arteriogram (10/12/2011); left heart catheterization with coronary angiogram (N/A, 05/11/2011); abdominal aortagram (N/A, 08/21/2011); abdominal aortagram (N/A, 03/16/2014); Femoral-popliteal Bypass Graft (Left, 04/17/2014); Esophagogastroduodenoscopy (egd) with propofol (N/A, 05/16/2014); Cholecystectomy open; Tubal ligation; and Esophagogastroduodenoscopy (egd) with propofol (N/A, 09/02/2015). Prior to Admission medications   Medication Sig Start Date End Date Taking? Authorizing Provider  aspirin EC 325 MG EC tablet Take 1 tablet (325 mg total) by mouth daily. 03/21/18  Yes Hongalgi, Lenis Dickinson, MD  cilostazol (PLETAL) 100 MG tablet TAKE 1 TABLET BY MOUTH TWICE A DAY BEFORE MEALS Patient taking differently: Take 100 mg by mouth 2 (two) times daily before a meal.  03/29/18  Yes Waynetta Sandy,  MD  DULoxetine (CYMBALTA) 60 MG capsule Take 1 capsule (60 mg total) by mouth daily. 03/10/18  Yes Elayne Snare,  MD  ferrous sulfate 325 (65 FE) MG tablet Take 325 mg by mouth daily with breakfast.    Yes [provider]  fludrocortisone (FLORINEF) 0.1 MG tablet Take 1 tablet (0.1 mg total) by mouth 3 (three) times a week. Patient taking differently: Take 0.1 mg by mouth See admin instructions. Take one tablet (0.1 mg) by mouth daily on Monday, Tuesday, Wednesday 1st week, then take one tablet (0.1 mg) on Thursday, Friday, Saturday 2nd week, then repeat 03/28/18  Yes Elayne Snare, MD  furosemide (LASIX) 20 MG tablet Take 1 tablet (20 mg total) by mouth daily. 03/20/18 03/20/19 Yes Hongalgi, Lenis Dickinson, MD  gabapentin (NEURONTIN) 300 MG capsule Take 1 capsule (300 mg total) by mouth 2 (two) times daily. 02/24/18  Yes Elayne Snare, MD  HYDROcodone-acetaminophen (NORCO) 10-325 MG tablet Take 1 tablet by mouth 3 (three) times daily as needed (for pain). Follow up with PCP if refills needed 04/24/17  Yes Aline August, MD  hydrOXYzine (ATARAX/VISTARIL) 25 MG tablet Take 1 tablet (25 mg total) by mouth every 6 (six) hours. Patient taking differently: Take 25 mg by mouth at bedtime.  10/27/17  Yes Jacqlyn Larsen, PA-C  insulin lispro (HUMALOG) 100 UNIT/ML injection Use 70 units daily in insulin pump Patient taking differently: Inject 70 Units into the skin See admin instructions. Use 70 units daily in insulin pump 10/22/17  Yes Elayne Snare, MD  lansoprazole (PREVACID) 30 MG capsule Take 1 capsule (30 mg total) by mouth daily. 05/11/16  Yes Geradine Girt, DO  levothyroxine (SYNTHROID, LEVOTHROID) 150 MCG tablet Take 1 tablet (150 mcg total) by mouth daily. 03/10/18  Yes Elayne Snare, MD  metoprolol succinate (TOPROL-XL) 25 MG 24 hr tablet TAKE 1 TABLET BY MOUTH EVERY DAY Patient taking differently: Take 25 mg by mouth daily.  02/01/18  Yes Imogene Burn, PA-C  nortriptyline (PAMELOR) 75 MG capsule Take 75 mg by  mouth at bedtime. 01/24/14  Yes [provider]  OXYGEN Inhale 1 L into the lungs as needed (shortness of breath).   Yes [provider]  polyethylene glycol (MIRALAX / GLYCOLAX) packet Take 17 g by mouth daily. Patient taking differently: Take 17 g by mouth daily as needed (constipation).  03/21/18  Yes Hongalgi, Lenis Dickinson, MD  potassium chloride (KLOR-CON 10) 10 MEQ tablet Take 1 tablet (10 mEq total) by mouth 2 (two) times daily. 02/24/18  Yes Elayne Snare, MD  PRESCRIPTION MEDICATION    Yes [provider]  QUEtiapine (SEROQUEL) 100 MG tablet Take 100 mg by mouth at bedtime. 09/25/16  Yes [provider]  senna (SENOKOT) 8.6 MG TABS tablet Take 2 tablets (17.2 mg total) by mouth daily. Patient taking differently: Take 1 tablet by mouth daily.  03/21/18  Yes Hongalgi, Lenis Dickinson, MD  tolterodine (DETROL LA) 4 MG 24 hr capsule Take 4 mg by mouth daily.    Yes [provider]  Vitamin D, Ergocalciferol, (DRISDOL) 50000 units CAPS capsule Take 50,000 Units by mouth every Monday.  09/18/16  Yes [provider]  ACCU-CHEK FASTCLIX LANCETS MISC Use 4 per day to check blood sugar dx code E11.9 11/29/14   Elayne Snare, MD  albuterol (PROVENTIL HFA;VENTOLIN HFA) 108 (90 Base) MCG/ACT inhaler Inhale 1-2 puffs into the lungs every 6 (six) hours as needed for wheezing or shortness of breath. Patient not taking: Reported on 04/02/2018 12/10/17   Kinnie Feil, PA-C  glucose blood (CONTOUR NEXT TEST) test strip Use to test blood sugar 3  times daily E11.9 10/15/17   Elayne Snare, MD  Insulin Pen Needle (BD PEN NEEDLE NANO U/F) 32G X 4 MM MISC Use to inject insulin 10/30/14   Elayne Snare, MD  nicotine (NICODERM CQ - DOSED IN MG/24 HOURS) 14 mg/24hr patch Place 1 patch (14 mg total) onto the skin daily. Patient not taking: Reported on 04/02/2018 03/21/18   Modena Jansky, MD  predniSONE (DELTASONE) 10 MG tablet Take 3 tabs daily for 2 days, then 2 tabs daily for 3 days, then  1 tab daily for 3 days, then stop. Patient taking differently: Take 10-30 mg by mouth See admin instructions. Take 3 tabs (30 mg) daily for 2 days, then 2 tabs (20 mg) daily for 3 days, then 1 tab (10 mg) daily for 3 days, then stop. 03/21/18   Hongalgi, Lenis Dickinson, MD  triamcinolone cream (KENALOG) 0.1 % Apply 1 application topically 2 (two) times daily. Patient not taking: Reported on 04/02/2018 12/10/17   Kinnie Feil, PA-C   No Known Allergies  FAMILY HISTORY:  family history includes Cancer in her father and mother; Heart disease in her father, mother, and sister; Lung cancer in her father and mother. SOCIAL HISTORY:  reports that she has been smoking cigarettes. She has a 75.00 pack-year smoking history. She has never used smokeless tobacco. She reports that she does not drink alcohol or use drugs.  REVIEW OF SYSTEMS: Positive for sudden onset shortness of breath   Constitutional: Negative for fever, chills, weight loss, malaise/fatigue and diaphoresis.  HENT: Negative for hearing loss, ear pain, nosebleeds, congestion, sore throat, neck pain, tinnitus and ear discharge.   Eyes: Negative for blurred vision, double vision, photophobia, pain, discharge and redness.  Respiratory: Negative for cough, hemoptysis, sputum production, wheezing and stridor.   Cardiovascular: Negative for chest pain, palpitations, orthopnea, claudication, leg swelling   Gastrointestinal: Negative for heartburn, nausea, vomiting, abdominal pain, diarrhea, constipation, blood in stool and melena.  Genitourinary: Negative for dysuria, urgency, frequency, hematuria and flank pain.  Musculoskeletal: Negative for myalgias, back pain, joint pain and falls.  Skin: Negative for itching and rash.  Neurological: Negative for dizziness, tingling, tremors, sensory change, speech change, focal weakness, seizures, loss of consciousness, weakness and headaches.  Endo/Heme/Allergies: Negative for environmental allergies and  polydipsia. Does not bruise/bleed easily.  SUBJECTIVE:   VITAL SIGNS: Temp:  [98.2 F (36.8 C)-98.5 F (36.9 C)] 98.5 F (36.9 C) (02/09 0800) Pulse Rate:  [79-97] 96 (02/09 0800) Resp:  [17-27] 24 (02/09 0800) BP: (97-140)/(54-95) 132/61 (02/09 0800) SpO2:  [87 %-97 %] 93 % (02/09 1130) FiO2 (%):  [30 %-40 %] 40 % (02/09 0800) Weight:  [76.2 kg] 76.2 kg (02/08 1720)  PHYSICAL EXAMINATION: Acutely ill-appearing, able to speak in full sentences, anxious No pallor, icterus, no JVD or cervical lymphadenopathy No accessory muscle usage, crackles right base, relatively clear on left, no rhonchi S1-S2 normal, no S3 Soft nontender abdomen. Alert, interactive, no tremors No edema  Recent Labs  Lab 04/02/18 1805 04/02/18 2048 04/02/18 2151 04/03/18 0407  NA 136 138 136 136  K 4.5 4.2 4.3 4.5  CL 103  --   --  104  CO2 18*  --   --  17*  BUN 28*  --   --  30*  CREATININE 2.61*  --   --  2.43*  GLUCOSE 242*  --   --  270*   Recent Labs  Lab 04/02/18 1805 04/02/18 2048 04/02/18 2151 04/03/18 0407  HGB 9.5*  10.2* 9.2* 9.7*  HCT 31.0* 30.0* 27.0* 29.5*  WBC 15.1*  --   --  16.0*  PLT 341  --   --  356   Dg Chest Portable 1 View  Result Date: 04/02/2018 CLINICAL DATA:  Dyspnea, history hypertension, diabetes mellitus, COPD, coronary artery disease EXAM: PORTABLE CHEST 1 VIEW COMPARISON:  Portable exam 1738 hours compared to 03/19/2018 FINDINGS: Enlargement of cardiac silhouette. Atherosclerotic calcification aorta. Mediastinal contours and pulmonary vascularity normal. Scattered interstitial infiltrates bilaterally increased in RIGHT upper lobe since previous exam. Improved infiltrates in LEFT upper lobe. This could represent waxing and waning edema or infection. Subsegmental atelectasis at lingula. No pleural effusion or pneumothorax. Bones demineralized. IMPRESSION: Scattered interstitial infiltrates, increased in RIGHT upper lobe and improved in LEFT upper lobe since previous  exam question pulmonary edema versus infection. Enlargement of cardiac silhouette with subsegmental atelectasis in lingula. Electronically Signed   By: Lavonia Dana M.D.   On: 04/02/2018 18:15    ASSESSMENT / PLAN:  Acute respiratory failure with hypoxia. Bilateral infiltrates that seem to wax and wane  -She was discharged on 1/26 on 1 L oxygen and infiltrates on chest x-ray from 1/23 appear to have slightly decreased compared to that on admission 1/22 and now seem to be worse again on 2/8.  That and her sudden onset of symptoms would be consistent with acute pulmonary edema, however surprisingly her LV function is normal.  And she does not have any signs of overt CHF or fluid overload.  BNP was also normal .  This could still possibly be diastolic dysfunction or HOCM-will have to ask cardiology to review her prior echo -ILD has to be considered,, and the smoker differential diagnosis would be broad and with upper lobe predominance, would primarily include RB ILD/hypersensitivity pneumonitis.  It does seem like she got steroids at her last admit -Does not appear to be infectious pneumonia, mild leukocytosis could have been related to steroids, note normal procalcitonin -Does not appear to be COPD exacerbation due to sudden onset and no bronchospasm on exam. -Unlikely PE since alternative because of bilateral infiltrates present   Recommend -I have changed her CT scan that was ordered to high-resolution CT chest to evaluate for ILD -Taper FiO2 as able with goal saturation 90% and above. -Discontinue IV fluids -Start nicotine patch 21 mg/day, smoking cessation strongly advised  Further work-up based on results of CT scan   Kara Mead MD. FCCP. [facility] Pager [phone] If no response call [phone]    04/03/2018, 1:50 PM

## 2018-04-03 NOTE — Progress Notes (Signed)
Pt resting comfortably at this time. Will place pt on bipap if needed throughout the night. RT will continue to monitor as needed.

## 2018-04-03 NOTE — Plan of Care (Signed)
Care plans reviewed and patient is progressing.  

## 2018-04-04 ENCOUNTER — Inpatient Hospital Stay (HOSPITAL_COMMUNITY): Payer: Medicare Other

## 2018-04-04 DIAGNOSIS — IMO0001 Reserved for inherently not codable concepts without codable children: Secondary | ICD-10-CM

## 2018-04-04 DIAGNOSIS — Z794 Long term (current) use of insulin: Secondary | ICD-10-CM

## 2018-04-04 DIAGNOSIS — J181 Lobar pneumonia, unspecified organism: Secondary | ICD-10-CM

## 2018-04-04 DIAGNOSIS — E119 Type 2 diabetes mellitus without complications: Secondary | ICD-10-CM

## 2018-04-04 DIAGNOSIS — R338 Other retention of urine: Secondary | ICD-10-CM

## 2018-04-04 LAB — RESPIRATORY PANEL BY PCR

## 2018-04-04 LAB — BASIC METABOLIC PANEL
Anion gap: 9 (ref 5–15)
BUN: 39 mg/dL — ABNORMAL HIGH (ref 8–23)
CHLORIDE: 109 mmol/L (ref 98–111)
CO2: 19 mmol/L — ABNORMAL LOW (ref 22–32)
Calcium: 8.8 mg/dL — ABNORMAL LOW (ref 8.9–10.3)
Creatinine, Ser: 2.36 mg/dL — ABNORMAL HIGH (ref 0.44–1.00)
GFR calc Af Amer: 23 mL/min — ABNORMAL LOW (ref >60)
GFR calc non Af Amer: 20 mL/min — ABNORMAL LOW (ref >60)
Glucose, Bld: 242 mg/dL — ABNORMAL HIGH (ref 70–99)
Potassium: 4.7 mmol/L (ref 3.5–5.1)
Sodium: 137 mmol/L (ref 135–145)

## 2018-04-04 LAB — ANTINUCLEAR ANTIBODIES, IFA: ANA Ab, IFA: NEGATIVE

## 2018-04-04 LAB — CBC WITH DIFFERENTIAL/PLATELET
ABS IMMATURE GRANULOCYTES: 0.16 10*3/uL — AB (ref 0.00–0.07)
Basophils Absolute: 0 10*3/uL (ref 0.0–0.1)
Basophils Relative: 0 %
Eosinophils Absolute: 0 10*3/uL (ref 0.0–0.5)
Eosinophils Relative: 0 %
HCT: 26.3 % — ABNORMAL LOW (ref 36.0–46.0)
Hemoglobin: 8.4 g/dL — ABNORMAL LOW (ref 12.0–15.0)
IMMATURE GRANULOCYTES: 1 %
LYMPHS PCT: 3 %
Lymphs Abs: 0.6 10*3/uL — ABNORMAL LOW (ref 0.7–4.0)
MCH: 26.5 pg (ref 26.0–34.0)
MCHC: 31.9 g/dL (ref 30.0–36.0)
MCV: 83 fL (ref 80.0–100.0)
Monocytes Absolute: 0.5 10*3/uL (ref 0.1–1.0)
Monocytes Relative: 3 %
NEUTROS PCT: 93 %
Neutro Abs: 16.8 10*3/uL — ABNORMAL HIGH (ref 1.7–7.7)
Platelets: 313 10*3/uL (ref 150–400)
RBC: 3.17 MIL/uL — ABNORMAL LOW (ref 3.87–5.11)
RDW: 15.9 % — ABNORMAL HIGH (ref 11.5–15.5)
WBC: 18.1 10*3/uL — ABNORMAL HIGH (ref 4.0–10.5)
nRBC: 0 % (ref 0.0–0.2)

## 2018-04-04 LAB — GLUCOSE, CAPILLARY
GLUCOSE-CAPILLARY: 179 mg/dL — AB (ref 70–99)
Glucose-Capillary: 217 mg/dL — ABNORMAL HIGH (ref 70–99)
Glucose-Capillary: 214 mg/dL — ABNORMAL HIGH (ref 70–99)
Glucose-Capillary: 299 mg/dL — ABNORMAL HIGH (ref 70–99)
Glucose-Capillary: 328 mg/dL — ABNORMAL HIGH (ref 70–99)

## 2018-04-04 LAB — URINE CULTURE: Culture: NO GROWTH

## 2018-04-04 LAB — MAGNESIUM: Magnesium: 2.4 mg/dL (ref 1.7–2.4)

## 2018-04-04 LAB — PROCALCITONIN: Procalcitonin: 0.11 ng/mL

## 2018-04-04 MED ORDER — INSULIN ASPART 100 UNIT/ML ~~LOC~~ SOLN
3.0000 [IU] | Freq: Three times a day (TID) | SUBCUTANEOUS | Status: DC
  Administered 2018-04-04 – 2018-04-07 (×11): 3 [IU] via SUBCUTANEOUS

## 2018-04-04 MED ORDER — TAMSULOSIN HCL 0.4 MG PO CAPS
0.4000 mg | ORAL_CAPSULE | Freq: Every day | ORAL | Status: DC
  Administered 2018-04-04 – 2018-04-06 (×3): 0.4 mg via ORAL
  Filled 2018-04-04 (×3): qty 1

## 2018-04-04 MED ORDER — VITAMIN D (ERGOCALCIFEROL) 1.25 MG (50000 UNIT) PO CAPS
50000.0000 [IU] | ORAL_CAPSULE | ORAL | Status: DC
  Administered 2018-04-04: 50000 [IU] via ORAL
  Filled 2018-04-04 (×2): qty 1

## 2018-04-04 MED ORDER — INSULIN GLARGINE 100 UNIT/ML ~~LOC~~ SOLN
10.0000 [IU] | Freq: Every day | SUBCUTANEOUS | Status: DC
  Administered 2018-04-04 – 2018-04-05 (×2): 10 [IU] via SUBCUTANEOUS
  Filled 2018-04-04 (×2): qty 0.1

## 2018-04-04 MED ORDER — POLYETHYLENE GLYCOL 3350 17 G PO PACK
17.0000 g | PACK | Freq: Every day | ORAL | Status: DC
  Administered 2018-04-04: 17 g via ORAL
  Filled 2018-04-04 (×4): qty 1

## 2018-04-04 MED ORDER — ALBUTEROL SULFATE (2.5 MG/3ML) 0.083% IN NEBU
INHALATION_SOLUTION | RESPIRATORY_TRACT | Status: AC
  Filled 2018-04-04: qty 3

## 2018-04-04 MED ORDER — HYDROXYZINE HCL 25 MG PO TABS: 25.0000 mg | ORAL_TABLET | Freq: Four times a day (QID) | ORAL | Status: DC

## 2018-04-04 MED ORDER — SENNOSIDES-DOCUSATE SODIUM 8.6-50 MG PO TABS
1.0000 | ORAL_TABLET | Freq: Two times a day (BID) | ORAL | Status: DC
  Administered 2018-04-04 – 2018-04-06 (×4): 1 via ORAL
  Filled 2018-04-04 (×8): qty 1

## 2018-04-04 MED ORDER — HYDROXYZINE HCL 25 MG PO TABS
25.0000 mg | ORAL_TABLET | Freq: Four times a day (QID) | ORAL | Status: DC | PRN
  Administered 2018-04-05: 25 mg via ORAL
  Filled 2018-04-04 (×2): qty 1

## 2018-04-04 MED ORDER — TECHNETIUM TC 99M DIETHYLENETRIAME-PENTAACETIC ACID
32.4000 | Freq: Once | INTRAVENOUS | Status: AC | PRN
  Administered 2018-04-04: 32.4 via RESPIRATORY_TRACT

## 2018-04-04 MED ORDER — TECHNETIUM TO 99M ALBUMIN AGGREGATED
4.3000 | Freq: Once | INTRAVENOUS | Status: AC | PRN
  Administered 2018-04-04: 4.3 via INTRAVENOUS

## 2018-04-04 NOTE — Progress Notes (Addendum)
Inpatient Diabetes Program Recommendations  AACE/ADA: New Consensus Statement on Inpatient Glycemic Control   Target Ranges:  Prepandial:   less than 140 mg/dL      Peak postprandial:   less than 180 mg/dL (1-2 hours)      Critically ill patients:  140 - 180 mg/dL   Results for April Branch, April Branch (MRN [id number]) as of 04/04/2018 15:08  Ref. Range 04/03/2018 08:12 04/03/2018 08:53 04/03/2018 11:26 04/03/2018 15:48 04/03/2018 21:18 04/03/2018 23:43 04/04/2018 04:42 04/04/2018 09:35 04/04/2018 11:26  Glucose-Capillary Latest Ref Range: 70 - 99 mg/dL 211 (H) 203 (H) 170 (H) 236 (H) 295 (H) 295 (H) 217 (H) 179 (H) 214 (H)   Review of Glycemic Control  Diabetes history: DM2 Outpatient Diabetes medications: Omni-Pod with Humalog Current orders for Inpatient glycemic control: Lantus 10 units QHS, Novolog 3 units TID with meals, Novolog 0-9 units Q4H; Prednisone 40 mg QAM   NOTE: Noted patient has DM2 and she has been using an OmniPod insulin pump with Humalog insulin for DM control and she sees Dr. [name] (last seen 02/24/18). Spoke with patient regarding DM control. Patient states that she turned off her insulin pump when she was admitted. Patient does not have personal data device at this time that has OmniPod insulin pump settings. Patient confirms that she last seen Dr. [name] on 02/24/18 and some changes were made with her insulin pump at that time. Patient is very tearful during brief visit. Provided emotional support and inquired about why patient was so upset and she states that "the doctor just gave me so bad news about my heart and I am just upset and worried. I have a lot of health problems but I don't want to die."  Patient reports that she needs something for anxiety. RN came in the room while Diabetes Coordinator present and RN was informed that patient is upset about recent news from the doctor and requesting medication for anxiety. Again provided emotional support. Informed patient that she is now ordered  Lantus and Novolog meal coverage and that steroids have been decreased so glucose should improve with changes made today. Patient verbalized understanding and states that she has no questions or concerns at this time related to DM. Will continue to follow along. Per progress note by Diabetes Coordinator J. Rosebud Poles, RN on 03/18/18, the following should be insulin pump settings:   Basal Rates: 12am- 1.35 units/hr 8am- 2 units/hr 6pm- 1.8 units/hr  Total Basal Insulin per 24 hours period= 41.6 units  Carbohydrate Ratio: 1 unit for every 10 Grams of Carbohydrates  Correction/Sensitivity Factor: 1 unit for every 75 mg/dl above Target CBG  Target CBG: 125 mg/dl  Thanks, Barnie Alderman, RN, MSN, CDE Diabetes Coordinator Inpatient Diabetes Program [phone] (Team Pager from 8am to 5pm)

## 2018-04-04 NOTE — Evaluation (Signed)
Physical Therapy Evaluation Patient Details Name: PARTHENA FERGESON MRN: [id number] DOB: 06/11/1945 Today's Date: 04/04/2018   History of Present Illness  April Branch is a 73yo F who comes [facility] on [facility] c hypoxia and dyspnea, Pt was recently admitted in Jan and DC with home O2. PMH: hypoTSH, HTN, CKD3, PVD, CAD, IDDM2.   Clinical Impression  Pt admitted with above diagnosis. Pt currently with functional limitations due to the deficits listed below (see "PT Problem List"). Upon entry, pt in bed, no family/caregiver present. The pt is awake and agreeable to participate. Pt reports low confidence in understanding correct use of O2 at home since prior DC, and that she would don O2 only with dyspnea: she also reports intermittent insidious dizziness daily, which is similar to dyspnea experienced in evaluation this date when SpO2 drops to 86% on 5L/min. Pt is a 94% SpO2 resting on 5L/min, which decreases with simple standing activity to 86%. Functional mobility assessment demonstrates similar effort requirements, limited tolerance, but no frank need for physical assistance, whereas the patient performed these at a higher level of independence PTA. Pt is educated on pursed lip breathing techniques, educated on how dizziness could be a sign of desaturation, and benefit of acquiring a pulse oximeter for home use. Pt will benefit from skilled PT intervention to increase independence and safety with basic mobility in preparation for discharge to the venue listed below.       Follow Up Recommendations Home health PT(needs education on O2 use during day, as well as beginning a safe physical activity program with O2 use. )    Equipment Recommendations  None recommended by PT    Recommendations for Other Services       Precautions / Restrictions Precautions Precautions: Fall Precaution Comments: monitor O2 sats Restrictions Weight Bearing Restrictions: No      Mobility  Bed Mobility Overal bed  mobility: Independent                Transfers Overall transfer level: Modified independent Equipment used: None Transfers: Sit to/from Stand           General transfer comment: performed 5x STS from EOB, hands free, no instability but does become tachypnic  Ambulation/Gait Ambulation/Gait assistance: (deferred at this time after desaturation to 86% and dizziness marching in place on 5L/min. )              Stairs            Wheelchair Mobility    Modified Rankin (Stroke Patients Only)       Balance Overall balance assessment: Modified Independent Sitting-balance support: No upper extremity supported;Feet supported       Standing balance support: No upper extremity supported;During functional activity Standing balance-Leahy Scale: Good Standing balance comment: gets dizzy after marching in place 10-15 reps, correlating with desaturation                              Pertinent Vitals/Pain Pain Assessment: No/denies pain    Home Living Family/patient expects to be discharged to:: Private residence Living Arrangements: Children(son works first shift) Available Help at Discharge: Family;Available PRN/intermittently Type of Home: House Home Access: Stairs to enter Entrance Stairs-Rails: Right;Left;Can reach both Entrance Stairs-Number of Steps: 4 Home Layout: One level;Laundry or work area in [place]: Environmental consultant - 4 wheels;Wheelchair - [employer];Shower seat Additional Comments: has 5 dogs    Prior Function Level of Independence: Needs assistance  Gait / Transfers Assistance Needed: no assistive device use in home; easily worn out usually limits distance s at grocery adn uses electric cart.   ADL's / Homemaking Assistance Needed: does wash dishes and sweeps, does not go to basement to do laundry (as steps need to be replaced)  Comments: pt reports she has a friend who assists intermittently as well      Hand  Dominance   Dominant Hand: Right    Extremity/Trunk Assessment   Upper Extremity Assessment Upper Extremity Assessment: Defer to OT evaluation;Overall [facility] for tasks assessed    Lower Extremity Assessment Lower Extremity Assessment: Overall WFL for tasks assessed       Communication   Communication: No difficulties  Cognition Arousal/Alertness: Awake/alert Behavior During Therapy: WFL for tasks assessed/performed Overall Cognitive Status: Within Functional Limits for tasks assessed                                        General Comments      Exercises     Assessment/Plan    PT Assessment Patient needs continued PT services  PT Problem List Decreased strength;Decreased activity tolerance;Decreased balance;Decreased mobility;Decreased knowledge of use of DME;Cardiopulmonary status limiting activity       PT Treatment Interventions Gait training;Functional mobility training;Therapeutic activities;Balance training;Patient/family education;DME instruction    PT Goals (Current goals can be found in the Care Plan section)  Acute Rehab PT Goals Patient Stated Goal: improve breathing and stop gettign dizzy during the day PT Goal Formulation: With patient Time For Goal Achievement: 04/18/18 Potential to Achieve Goals: Good    Frequency Min 3X/week   Barriers to discharge Decreased caregiver support son works during the day    Co-evaluation               AM-PAC PT "6 Clicks" Mobility  Outcome Measure Help needed turning from your back to your side while in a flat bed without using bedrails?: None Help needed moving from lying on your back to sitting on the side of a flat bed without using bedrails?: None Help needed moving to and from a bed to a chair (including a wheelchair)?: None Help needed standing up from a chair using your arms (e.g., wheelchair or bedside chair)?: None Help needed to walk in hospital room?: A Little Help needed climbing 3-5  steps with a railing? : A Little 6 Click Score: 22    End of Session Equipment Utilized During Treatment: Oxygen Activity Tolerance: Patient tolerated treatment well;Patient limited by fatigue;Treatment limited secondary to medical complications (Comment)(tachypnea and desaturation with basic standing activity) Patient left: in bed;with call bell/phone within reach;with bed alarm set Nurse Communication: Mobility status PT Visit Diagnosis: Other abnormalities of gait and mobility (R26.89);Difficulty in walking, not elsewhere classified (R26.2);Other symptoms and signs involving the nervous system (R29.898)    Time: 1308-6578 PT Time Calculation (min) (ACUTE ONLY): 19 min   Charges:   PT Evaluation $PT Eval Low Complexity: 1 Low PT Treatments $Self Care/Home Management: 8-22        10:44 AM, 04/04/18 Etta Grandchild, PT, DPT Physical Therapist - [place] [phone] (Pager)  [phone] (Office)     , C 04/04/2018, 10:41 AM

## 2018-04-04 NOTE — Progress Notes (Signed)
Patient has orders for insulin pump administration but patient conveyed to day shift RN that she was not using her insulin pump while she was in the hospital. There are orders for sliding scale insulin coverage. As a result, nurse is charting against the insulin pump in the [facility] and giving patient SSI based on unit glucometer reading. Will continue to monitor. Lajoyce Corners, RN

## 2018-04-04 NOTE — Progress Notes (Signed)
Nutrition Consult/Brief Note  RD consulted via COPD Focused Protocol.  Wt Readings from Last 15 Encounters:  04/03/18 78.4 kg  03/16/18 75.1 kg  02/24/18 72.2 kg  01/14/18 70.8 kg  01/13/18 71.2 kg  10/14/17 70.7 kg  10/13/17 69.4 kg  09/22/17 70.6 kg  08/17/17 71.5 kg  05/04/17 71.1 kg  04/29/17 70.8 kg  04/23/17 71.1 kg  04/20/17 70.8 kg  03/30/17 71.7 kg  10/31/16 73.7 kg   Body mass index is 32.66 kg/m. Patient meets criteria for Obesity Class I based on current BMI.   Current diet order is Carbohydrate Modified, patient is consuming approximately 65% of meals at this time. Labs and medications reviewed.   No nutrition interventions warranted at this time. If nutrition issues arise, please consult RD.   Arthur Holms, RD, LDN Pager #: [phone] After-Hours Pager #: [phone]

## 2018-04-04 NOTE — Progress Notes (Signed)
Patient was not voiding sufficiently. With the purewick, she voided 100cc in the canister. Text paged [facility] and received order for in and out cath. Explained procedure to patient. Pericare performed, patient prepped with betadine swabs and catheter inserted. Total volume in catheter bag = 1000cc.  Purewick not replaced in an effort to promote the urge to void for the patient. Informed patient of the need to call for assistance if she feels the urge to void. Bed placed in lowest position and call bell within reach. Will continue to monitor. Lajoyce Corners, RN

## 2018-04-04 NOTE — Care Management Note (Addendum)
Case Management Note Marvetta Gibbons RN, BSN Transitions of Care Unit 4E- RN Case Manager [phone]  Patient Details  Name: April Branch MRN: [id number] Date of Birth: 1945/06/18  Subjective/Objective:    Pt admitted with acute on chronic resp. Failure, currently on HF [place].                Action/Plan: PTA pt lived at home with SO, was recently sent home with home 02 arranged with [facility], and referral for HHPT/OT with [facility] however [facility] could not reach pt and pt did not return messages so HH was never started-  CM will follow for transition of care needs- if returns home will need new orders for [facility].   Expected Discharge Date:                  Expected Discharge Plan:  [place]  In-House Referral:     Discharge planning Services  CM Consult  Post Acute Care Choice:  Home Health, Resumption of Svcs/PTA Provider Choice offered to:  Patient  DME Arranged:    DME Agency:     HH Arranged:    [place] Agency:  [place]  Status of Service:  In process, will continue to follow  If discussed at Long Length of Stay Meetings, dates discussed:    Discharge Disposition:   Additional Comments:  Dawayne Patricia, RN 04/04/2018, 11:37 AM

## 2018-04-04 NOTE — Progress Notes (Signed)
PROGRESS NOTE  April Branch SAY:[id number] DOB: Jul 18, 1945 DOA: 04/02/2018 PCP: [name], MD  HPI/Recap of past 24 hours:   Has urinary retention required in and out cath last night with 1000cc urine removed, total urine output last 24hr was 2230. She reports has been having difficulty urinating in the last 37months  She is on HFNC 5liter, o2 dropped to 88% with activity, continue to have dry cough, no fever, she denies chest pain, no edema  She report weight gain , base weight is 156, she weight 168 this admission,  Assessment/Plan: Active Problems:   Hypothyroidism   Insulin dependent type 2 diabetes mellitus, controlled (HCC)   DISORDER, TOBACCO USE   Essential hypertension   CAD (coronary artery disease)   Hyperlipidemia   Chronic pain syndrome   Acute kidney injury superimposed on CKD (HCC)   Respiratory failure with hypoxia (HCC)   Acute on chronic respiratory failure with hypoxia (HCC)   Acute on chronic diastolic CHF (congestive heart failure) ([place])   HCAP (healthcare-associated pneumonia)  Acute on chronic hypoxic respiratory failure -she was hospitalized for the same , was sent home on home 02 and night time bipap, she missed her follow up appointment with pulm on 2/6 -persistent bilateral pulmonary infiltrate, no fever, does has leukocytosis, though she is aaox3, does not appear septic, troponin negative, abg with normal ph, no co2 retention.  -mrsa screening negative, urine strep pneumo antigen negative, respiratory viral panel in process, sputum culture pending collection, procalcitonin level 0.1 -ana/ anca/RF in process -high resolution CT scan concerning for bilateral bronchopneumonia,low probability  VQ scan  -continue nebs/steroids, d/c vanc, she has no fever, but does has leukocytosis, order blood culture /urine culture, continue cefepime for now --pulmonology consulted, input apprecaited  AKI on CKDIII with mild metabolic acidosis -cr baseline  1.5 -cr on presentation is 2.6 -ua with rare bacteria, urine culture in process, she has urinary retention, required in and out cath last night, bladder scan qshift, start flomax, increase activity , avoid constipation, d/c toviaz    H/o diastolic CHF Hard to assess volume She does not have leg edema, but has bilateral lung infiltrate and wight gain She is recently started on lasix from last discharge  HTN; continue toprol  Hypomagnesemia: Replaced mag, keep mag >2, monitor   Insulin dependent dm2 a1c 7.9  On insulin pump at home , pump is turned off since admission Start lantus qhs, meal coverage, ssi, blood glucose likely will be elevated while on iv steroids, adjust insulin accordingly   H/o COPD: recently started on home o2,  Currently no wheezing She reports quick smoking several weeks ago  h/o CVA/TIA with mild left hemiparesis/memory impairment -on asa 325, betablocker , not on statin, (LDL 64) -she reports difficulty remembering new info since after the stroke  H/o PAD on pletal  H/o renal cancer? Vs adrenal mass  On chronic florinef, unclear detail,she is followed with endocrinology regularly, her next appoint with  Endocrinology Dr [name] on 2/12   Hypothyrodiism: Continue synthroid  Anxiety/depression/Insomnia  Reports has been on ativan for 26yrs in the past, she was taken off ativan, currently on pamelor, cymbalta, seroquel ,hydroxyzine  Chronic pain: on norco , neurontin   Code Status: DNR, confirmed with patient  Family Communication: patient   Disposition Plan: remain in stepdown   Consultants:  pulm  Procedures:  bipap  Antibiotics:  vanc on presentation to 2/9  cefepime on presentation to present    Objective: BP (!) 126/50 (BP  Location: Left Arm)   Pulse 93   Temp (!) 97.4 F (36.3 C) (Oral)   Resp 17   Ht 5\' 1"  (1.549 m)   Wt 78.4 kg   SpO2 92%   BMI 32.66 kg/m   Intake/Output Summary (Last 24 hours) at 04/04/2018  0742 Last data filed at 04/04/2018 8657 Gross per 24 hour  Intake 1023 ml  Output 2230 ml  Net -1207 ml   Filed Weights   04/02/18 1720 04/03/18 0323  Weight: 76.2 kg 78.4 kg    Exam: Patient is examined daily including today on 04/04/2018, exams remain the same as of yesterday except that has changed    General:  NAD  Cardiovascular: RRR  Respiratory: coarse, no wheezing  Abdomen: Soft/ND/NT, positive BS  Musculoskeletal: No Edema  Neuro: alert, oriented   Data Reviewed: Basic Metabolic Panel: Recent Labs  Lab 04/02/18 1805 04/02/18 2048 04/02/18 2151 04/03/18 0407 04/04/18 0307  NA 136 138 136 136 137  K 4.5 4.2 4.3 4.5 4.7  CL 103  --   --  104 109  CO2 18*  --   --  17* 19*  GLUCOSE 242*  --   --  270* 242*  BUN 28*  --   --  30* 39*  CREATININE 2.61*  --   --  2.43* 2.36*  CALCIUM 8.7*  --   --  8.7* 8.8*  MG  --   --   --  1.6* 2.4  PHOS  --   --   --  4.7*  --    Liver Function Tests: Recent Labs  Lab 04/03/18 0407  AST 13*  ALT 13  ALKPHOS 107  BILITOT 0.5  PROT 7.1  ALBUMIN 3.0*   No results for input(s): LIPASE, AMYLASE in the last 168 hours. No results for input(s): AMMONIA in the last 168 hours. CBC: Recent Labs  Lab 04/02/18 1805 04/02/18 2048 04/02/18 2151 04/03/18 0407 04/04/18 0307  WBC 15.1*  --   --  16.0* 18.1*  NEUTROABS 13.3*  --   --   --  16.8*  HGB 9.5* 10.2* 9.2* 9.7* 8.4*  HCT 31.0* 30.0* 27.0* 29.5* 26.3*  MCV 84.5  --   --  81.9 83.0  PLT 341  --   --  356 313   Cardiac Enzymes:   Recent Labs  Lab 04/02/18 2202 04/03/18 0407 04/03/18 1015  TROPONINI <0.03 <0.03 <0.03   BNP (last 3 results) Recent Labs    03/16/18 0911 04/02/18 1805  BNP 279.6* 331.9*    ProBNP (last 3 results) No results for input(s): PROBNP in the last 8760 hours.  CBG: Recent Labs  Lab 04/03/18 1126 04/03/18 1548 04/03/18 2118 04/03/18 2343 04/04/18 0442  GLUCAP 170* 236* 295* 295* 217*    Recent Results (from the  past 240 hour(s))  MRSA PCR Screening     Status: None   Collection Time: 04/03/18  1:26 PM  Result Value Ref Range Status   MRSA by PCR NEGATIVE NEGATIVE Final    Comment:        The GeneXpert MRSA Assay (FDA approved for NASAL specimens only), is one component of a comprehensive MRSA colonization surveillance program. It is not intended to diagnose MRSA infection nor to guide or monitor treatment for MRSA infections. Performed at [facility], [place] [address] [place], [place] [address]      Studies: Ct Chest High Resolution  Result Date: 04/03/2018 CLINICAL DATA:  73 year old female with  history of interstitial lung disease. EXAM: CT CHEST WITHOUT CONTRAST TECHNIQUE: Multidetector CT imaging of the chest was performed following the standard protocol without intravenous contrast. High resolution imaging of the lungs, as well as inspiratory and expiratory imaging, was performed. COMPARISON:  Chest CT 05/09/2011. FINDINGS: Cardiovascular: Heart size is mildly enlarged. There is no significant pericardial fluid, thickening or pericardial calcification. There is aortic atherosclerosis, as well as atherosclerosis of the great vessels of the mediastinum and the coronary arteries, including calcified atherosclerotic plaque in the left main, left anterior descending, left circumflex and right coronary arteries. Mediastinum/Nodes: No pathologically enlarged mediastinal or hilar lymph nodes. Please note that accurate exclusion of hilar adenopathy is limited on noncontrast CT scans. Esophagus is unremarkable in appearance. No axillary lymphadenopathy. Lungs/Pleura: Trace left and small right pleural effusions. Patchy multifocal ground-glass attenuation and septal thickening is noted throughout all aspects of the lungs bilaterally, most evident in a peribronchovascular distribution. No definite craniocaudal gradient. No subpleural reticulation. No significant regions of traction bronchiectasis. No  honeycombing. Inspiratory and expiratory imaging demonstrates some mild air trapping, indicative of mild small airways disease. Upper Abdomen: Aortic atherosclerosis. Musculoskeletal: There are no aggressive appearing lytic or blastic lesions noted in the visualized portions of the skeleton. IMPRESSION: 1. The appearance the chest is favored to reflect a multilobar bronchopneumonia, rather than underlying interstitial lung disease. This is particularly evident when reviewing prior chest radiographs. If there is persistent clinical concern for interstitial lung disease, repeat high-resolution chest CT could be obtained in 6-12 months to assess for temporal changes in the appearance of the lung parenchyma. 2. Small right and trace left pleural effusions lying dependently. 3. Mild air trapping indicative of mild small airways disease. 4. Aortic atherosclerosis, in addition to left main and 3 vessel coronary artery disease. Assessment for potential risk factor modification, dietary therapy or pharmacologic therapy may be warranted, if clinically indicated. Aortic Atherosclerosis (ICD10-I70.0). Electronically Signed   By: Vinnie Langton M.D.   On: 04/03/2018 16:37    Scheduled Meds: . aspirin  325 mg Oral Daily  . cilostazol  100 mg Oral BID AC  . DULoxetine  60 mg Oral Daily  . enoxaparin (LOVENOX) injection  30 mg Subcutaneous Q24H  . fesoterodine  4 mg Oral Daily  . fludrocortisone  0.1 mg Oral Once per day on Mon Tue Wed  . fluticasone furoate-vilanterol  1 puff Inhalation Daily  . furosemide  20 mg Oral Daily  . gabapentin  300 mg Oral BID  . guaiFENesin  600 mg Oral BID  . insulin aspart  0-9 Units Subcutaneous Q4H  . insulin pump   Subcutaneous Q4H  . ipratropium-albuterol  3 mL Nebulization QID  . levothyroxine  125 mcg Oral QAC breakfast  . metoprolol succinate  25 mg Oral Daily  . nicotine  14 mg Transdermal Daily  . nortriptyline  75 mg Oral QHS  . predniSONE  40 mg Oral Q breakfast  .  QUEtiapine  100 mg Oral QHS  . sodium chloride flush  3 mL Intravenous Q12H    Continuous Infusions: . sodium chloride    . ceFEPime (MAXIPIME) IV Stopped (04/03/18 2230)     Time spent: 66mins I have personally reviewed and interpreted on  04/04/2018 daily labs, tele strips, imagings as discussed above under date review session and assessment and plans.  I reviewed all nursing notes, pharmacy notes,  vitals, pertinent old records  I have discussed plan of care as described above with RN , patient  on 04/04/2018   Florencia Reasons MD, PhD  [name] Pager [phone]. If 7PM-7AM, please contact night-coverage at [email or web address], password [facility] 04/04/2018, 7:42 AM  LOS: 2 days

## 2018-04-04 NOTE — Evaluation (Signed)
Occupational Therapy Evaluation Patient Details Name: April Branch MRN: [id number] DOB: 29-Dec-1945 Today's Date: 04/04/2018    History of Present Illness April Branch is a 73yo F who comes to [facility] on 2/8 c hypoxia and dyspnea, Pt was recently admitted in Jan and DC with home O2. PMH: hypoTSH, HTN, CKD3, PVD, CAD, IDDM2.    Clinical Impression   Pt with decline in function and safety with ADLs and ADL mobility with decreased strength, balance ane endurance. . Pt on 5L O2 with SATs dropping 86-87% during minimal activity. Pt would benefit from acute OT services to address impairments to maximize level of function and safety    Follow Up Recommendations  Supervision - Intermittent    Equipment Recommendations  3 in 1 bedside commode;Other (comment)(reacher)    Recommendations for Other Services       Precautions / Restrictions Precautions Precautions: Fall Precaution Comments: monitor O2 sats Restrictions Weight Bearing Restrictions: No      Mobility Bed Mobility Overal bed mobility: Independent Bed Mobility: Supine to Sit;Sit to Supine     Supine to sit: Independent Sit to supine: Independent      Transfers Overall transfer level: Modified independent Equipment used: None Transfers: Sit to/from Stand Sit to Stand: Modified independent (Device/Increase time)         General transfer comment: performed 5x STS from EOB, hands free, no instability but does become tachypnic    Balance Overall balance assessment: Modified Independent Sitting-balance support: No upper extremity supported;Feet supported Sitting balance-Leahy Scale: Good Sitting balance - Comments: put on socks at EOB   Standing balance support: No upper extremity supported;During functional activity Standing balance-Leahy Scale: Good Standing balance comment: gets dizzy after marching in place 10-15 reps, correlating with desaturation                            ADL either  performed or assessed with clinical judgement   ADL Overall ADL's : Needs assistance/impaired     Grooming: Wash/dry hands;Oral care;Supervision/safety;Standing;Wash/dry face   Upper Body Bathing: Set up;Sitting   Lower Body Bathing: Min guard;Sit to/from stand   Upper Body Dressing : Set up;Sitting   Lower Body Dressing: Min guard;Sit to/from stand   Toilet Transfer: Supervision/safety;Ambulation;Modified Independent   Toileting- Clothing Manipulation and Hygiene: Supervision/safety;Sit to/from stand       Functional mobility during ADLs: Rolling walker;Modified independent;Supervision/safety General ADL Comments: reviewed handout on energy conservation techniques and utilized during self care tasks      Vision Baseline Vision/History: Wears glasses Patient Visual Report: No change from baseline       Perception     Praxis      Pertinent Vitals/Pain Pain Assessment: No/denies pain Faces Pain Scale: No hurt Pain Intervention(s): Monitored during session     Hand Dominance Right   Extremity/Trunk Assessment Upper Extremity Assessment Upper Extremity Assessment: Generalized weakness   Lower Extremity Assessment Lower Extremity Assessment: Defer to PT evaluation   Cervical / Trunk Assessment Cervical / Trunk Assessment: Normal   Communication Communication Communication: No difficulties   Cognition Arousal/Alertness: Awake/alert Behavior During Therapy: WFL for tasks assessed/performed Overall Cognitive Status: Within Functional Limits for tasks assessed                                     General Comments       Exercises     Shoulder  Instructions      Home Living Family/patient expects to be discharged to:: Private residence Living Arrangements: Children Available Help at Discharge: Family;Available PRN/intermittently Type of Home: House Home Access: Stairs to enter [employer] of Steps: 4 Entrance Stairs-Rails:  Right;Left;Can reach both Home Layout: One level;Laundry or work area in [place] Shower/Tub: Corporate investment banker: [place]: Environmental consultant - 4 wheels;Wheelchair - [employer];Shower seat   Additional Comments: has 5 dogs  Lives With: Son    Prior Functioning/Environment Level of Independence: Needs assistance  Gait / Transfers Assistance Needed: no assistive device use in home; easily worn out usually limits distance at grocery store and uses electric cart.  ADL's / Homemaking Assistance Needed: cooks, does wash dishes and sweeps, does not go to basement to do laundry (as steps need to be replaced)   Comments: pt reports she has a friend who assists intermittently as well         OT Problem List: Decreased strength;Impaired balance (sitting and/or standing);Decreased activity tolerance;Cardiopulmonary status limiting activity;Decreased knowledge of use of DME or AE      OT Treatment/Interventions: Self-care/ADL training;DME and/or AE instruction;Patient/family education;Therapeutic activities;Energy conservation    OT Goals(Current goals can be found in the care plan section) Acute Rehab OT Goals Patient Stated Goal: improve breathing and stop gettign dizzy during the day OT Goal Formulation: With patient Time For Goal Achievement: 04/18/18 Potential to Achieve Goals: Good ADL Goals Pt Will Perform Grooming: with supervision;with set-up;with modified independence;standing Pt Will Perform Upper Body Bathing: Independently Pt Will Perform Lower Body Bathing: with supervision;sit to/from stand Pt Will Perform Upper Body Dressing: Independently Pt Will Perform Lower Body Dressing: with supervision;sit to/from stand Pt Will Transfer to Toilet: with modified independence;ambulating Pt Will Perform Toileting - Clothing Manipulation and hygiene: with supervision;with modified independence;sit to/from stand Pt Will Perform  Tub/Shower Transfer: with supervision;ambulating Additional ADL Goal #1: Pt will verbalize and demo 3 energy conservation techniques during ADLs and ADL mobility  OT Frequency: Min 2X/week   Barriers to D/C:            Co-evaluation              AM-PAC OT "6 Clicks" Daily Activity     Outcome Measure Help from another person eating meals?: None Help from another person taking care of personal grooming?: A Little Help from another person toileting, which includes using toliet, bedpan, or urinal?: None Help from another person bathing (including washing, rinsing, drying)?: A Little Help from another person to put on and taking off regular upper body clothing?: None Help from another person to put on and taking off regular lower body clothing?: A Little 6 Click Score: 21   End of Session Equipment Utilized During Treatment: Oxygen;Gait belt  Activity Tolerance: Patient tolerated treatment well Patient left: with call bell/phone within reach;in bed  OT Visit Diagnosis: Muscle weakness (generalized) (M62.81);Unsteadiness on feet (R26.81)                Time: 1025-8527 OT Time Calculation (min): 26 min Charges:  OT General Charges $OT Visit: 1 Visit OT Evaluation $OT Eval Low Complexity: 1 Low OT Treatments $Self Care/Home Management : 8-22 mins    Britt Bottom 04/04/2018, 1:00 PM

## 2018-04-04 NOTE — Progress Notes (Signed)
Name: April Branch MRN: [id number] DOB: [birth year]    ADMISSION DATE:  04/02/2018 CONSULTATION DATE:  04/04/2018   REFERRING MD :  Marvis Repress  CHIEF COMPLAINT:  Hypoxia, resp distress  BRIEF PATIENT DESCRIPTION: 73 year old smoker with recent admission 1/22 to 1/26 for bilateral infiltrates and hypoxia that responded to diuresis and steroids, readmitted with sudden onset shortness of breath and bilateral infiltrates  SIGNIFICANT EVENTS  Admit 1/22-1/26  STUDIES:  Echo 02/2017-normal LV function, diastolic dysfunction could not be assessed Echo 10/2016 -mild focal basal septal hypertrophy   HISTORY OF PRESENT ILLNESS: 73 year old smoker, hypertensive and diabetic was admitted 1/22 to 1/26 for left-sided weakness and inability to walk.  She has a prior history of CVA with residual left hemiparesis.  Chest x-ray was noted to have bilateral infiltrates, she required BiPAP initially and follow-up chest x-ray on 1/23 shows some improvement of infiltrates, personally reviewed.  She was treated with IV Solu-Medrol, respiratory viral panel was negative , she seemed to improve with Lasix and oxygen requirements decreased from 4 L to 1 L rather quickly, she was discharged on 1 L oxygen but plan to follow-up with pulmonary office on 2/6.  Of note BNP was normal but she was still labeled as having acute on chronic diastolic heart failure.  She was unable to keep follow-up with pulmonary office.  Developed sudden onset shortness of breath overnight on 2/8, went to bed feeling okay and suddenly woke up feeling short of breath which is not relieved by oxygen, came into the emergency room with chest x-ray again showed worsening bilateral infiltrates predominantly in both upper lobes, BNP was 332, mild leukocytosis, she was on 30 mg of prednisone.  She required BiPAP overnight and has transitioned to high flow nasal cannula this morning. On my arrival, saturation was 100% on 15 L high flow nasal cannula and  she was able to speak in full sentences and provide me the story 04/04/2018 she is much more comfortable.  Negative I&O.  Appears to be responding to current interventions.     SUBJECTIVE:  Reports breathing better 11/2018 VITAL SIGNS: Temp:  [97.4 F (36.3 C)-98.6 F (37 C)] 98 F (36.7 C) (02/10 1136) Pulse Rate:  [88-108] 95 (02/10 1136) Resp:  [15-24] 22 (02/10 1136) BP: (86-133)/(43-77) 128/47 (02/10 1136) SpO2:  [87 %-100 %] 100 % (02/10 1149)  Intake/Output Summary (Last 24 hours) at 04/04/2018 1251 Last data filed at 04/04/2018 0734 Gross per 24 hour  Intake 703 ml  Output 2230 ml  Net -1527 ml    PHYSICAL EXAMINATION: General: Female who appears older than her stated age and appears chronically ill HEENT: No lymphadenopathy or JVD is appreciated Neuro: Alert and orientated CV: Heart sounds are regular PULM: Decreased air movement throughout* VO:ZDGU, non-tender, bsx4 active  Extremities: warm/dry, 1+ edema  Skin: no rashes or lesions   Recent Labs  Lab 04/02/18 1805  04/02/18 2151 04/03/18 0407 04/04/18 0307  NA 136   < > 136 136 137  K 4.5   < > 4.3 4.5 4.7  CL 103  --   --  104 109  CO2 18*  --   --  17* 19*  BUN 28*  --   --  30* 39*  CREATININE 2.61*  --   --  2.43* 2.36*  GLUCOSE 242*  --   --  270* 242*   < > = values in this interval not displayed.   Recent Labs  Lab 04/02/18 1805  04/02/18 2151  04/03/18 0407 04/04/18 0307  HGB 9.5*   < > 9.2* 9.7* 8.4*  HCT 31.0*   < > 27.0* 29.5* 26.3*  WBC 15.1*  --   --  16.0* 18.1*  PLT 341  --   --  356 313   < > = values in this interval not displayed.   Dg Chest 2 View  Result Date: 04/04/2018 CLINICAL DATA:  Productive cough 1 week which shortness-of-breath chronically as well as left-sided chest pain 1 month. EXAM: CHEST - 2 VIEW COMPARISON:  04/02/2018, 03/19/2018 and chest CT 04/03/2018 FINDINGS: Lungs are adequately inflated demonstrate patchy bilateral mixed interstitial airspace density  worse over the right upper lobe. Findings were seen on the recent CT scan suggesting infection. Likely layering posterior right pleural effusion. Mild stable cardiomegaly. Remainder of the exam is unchanged. IMPRESSION: Patchy bilateral mixed interstitial airspace process worse over the right upper lobe as seen on recent CT scan and likely due to infection. Probable layering small right pleural effusion. Stable cardiomegaly. Electronically Signed   By: Marin Olp M.D.   On: 04/04/2018 09:10   Ct Chest High Resolution  Result Date: 04/03/2018 CLINICAL DATA:  73 year old female with history of interstitial lung disease. EXAM: CT CHEST WITHOUT CONTRAST TECHNIQUE: Multidetector CT imaging of the chest was performed following the standard protocol without intravenous contrast. High resolution imaging of the lungs, as well as inspiratory and expiratory imaging, was performed. COMPARISON:  Chest CT 05/09/2011. FINDINGS: Cardiovascular: Heart size is mildly enlarged. There is no significant pericardial fluid, thickening or pericardial calcification. There is aortic atherosclerosis, as well as atherosclerosis of the great vessels of the mediastinum and the coronary arteries, including calcified atherosclerotic plaque in the left main, left anterior descending, left circumflex and right coronary arteries. Mediastinum/Nodes: No pathologically enlarged mediastinal or hilar lymph nodes. Please note that accurate exclusion of hilar adenopathy is limited on noncontrast CT scans. Esophagus is unremarkable in appearance. No axillary lymphadenopathy. Lungs/Pleura: Trace left and small right pleural effusions. Patchy multifocal ground-glass attenuation and septal thickening is noted throughout all aspects of the lungs bilaterally, most evident in a peribronchovascular distribution. No definite craniocaudal gradient. No subpleural reticulation. No significant regions of traction bronchiectasis. No honeycombing. Inspiratory and  expiratory imaging demonstrates some mild air trapping, indicative of mild small airways disease. Upper Abdomen: Aortic atherosclerosis. Musculoskeletal: There are no aggressive appearing lytic or blastic lesions noted in the visualized portions of the skeleton. IMPRESSION: 1. The appearance the chest is favored to reflect a multilobar bronchopneumonia, rather than underlying interstitial lung disease. This is particularly evident when reviewing prior chest radiographs. If there is persistent clinical concern for interstitial lung disease, repeat high-resolution chest CT could be obtained in 6-12 months to assess for temporal changes in the appearance of the lung parenchyma. 2. Small right and trace left pleural effusions lying dependently. 3. Mild air trapping indicative of mild small airways disease. 4. Aortic atherosclerosis, in addition to left main and 3 vessel coronary artery disease. Assessment for potential risk factor modification, dietary therapy or pharmacologic therapy may be warranted, if clinically indicated. Aortic Atherosclerosis (ICD10-I70.0). Electronically Signed   By: Vinnie Langton M.D.   On: 04/03/2018 16:37   Nm Pulmonary Perf And Vent  Result Date: 04/04/2018 CLINICAL DATA:  73 year old with hypoxemia or respiratory failure. EXAM: NUCLEAR MEDICINE VENTILATION - PERFUSION LUNG SCAN TECHNIQUE: Ventilation images were obtained in multiple projections using inhaled aerosol Tc-19m DTPA. Perfusion images were obtained in multiple projections after intravenous injection of Tc-25m MAA. RADIOPHARMACEUTICALS:  32.4 mCi of Tc-75m DTPA aerosol inhalation and 4.3 mCi Tc49m MAA IV COMPARISON:  Ventilation and perfusion examination 04/23/2017. Chest radiograph 04/04/2018 FINDINGS: Ventilation: Patchy uptake in both lungs without a large wedge-shaped defect. Perfusion: No wedge shaped peripheral perfusion defects to suggest acute pulmonary embolism. IMPRESSION: Normal perfusion to both lungs. No  evidence for a pulmonary embolism. Electronically Signed   By: Markus Daft M.D.   On: 04/04/2018 09:54   Dg Chest Portable 1 View  Result Date: 04/02/2018 CLINICAL DATA:  Dyspnea, history hypertension, diabetes mellitus, COPD, coronary artery disease EXAM: PORTABLE CHEST 1 VIEW COMPARISON:  Portable exam 1738 hours compared to 03/19/2018 FINDINGS: Enlargement of cardiac silhouette. Atherosclerotic calcification aorta. Mediastinal contours and pulmonary vascularity normal. Scattered interstitial infiltrates bilaterally increased in RIGHT upper lobe since previous exam. Improved infiltrates in LEFT upper lobe. This could represent waxing and waning edema or infection. Subsegmental atelectasis at lingula. No pleural effusion or pneumothorax. Bones demineralized. IMPRESSION: Scattered interstitial infiltrates, increased in RIGHT upper lobe and improved in LEFT upper lobe since previous exam question pulmonary edema versus infection. Enlargement of cardiac silhouette with subsegmental atelectasis in lingula. Electronically Signed   By: Lavonia Dana M.D.   On: 04/02/2018 18:15    ASSESSMENT / PLAN:  Acute respiratory failure with hypoxia. Bilateral infiltrates that seem to wax and wane  -She was discharged on 1/26 on 1 L oxygen and infiltrates on chest x-ray from 1/23 appear to have slightly decreased compared to that on admission 1/22 and now seem to be worse again on 2/8.  That and her sudden onset of symptoms would be consistent with acute pulmonary edema, however surprisingly her LV function is normal.  And she does not have any signs of overt CHF or fluid overload.  BNP was also normal .  This could still possibly be diastolic dysfunction or HOCM-will have to ask cardiology to review her prior echo -ILD has to be considered,, and the smoker differential diagnosis would be broad and with upper lobe predominance, would primarily include RB ILD/hypersensitivity pneumonitis.  It does seem like she got steroids  at her last admit -Does not appear to be infectious pneumonia, mild leukocytosis could have been related to steroids, note normal procalcitonin -Does not appear to be COPD exacerbation due to sudden onset and no bronchospasm on exam. -Unlikely PE since alternative because of bilateral infiltrates present   Recommend -CT scan demonstrates a multilobar bronchopneumonia resident underlying interstitial lung disease per CT reading questionable repeat CT in 6 to 12 months -Wean FiO2 as able level saturations greater 90% -Limit fluids -Nicotine patch and smoking cessation -Pursue negative I&O     Richardson Landry Wendel Homeyer ACNP Maryanna Shape PCCM Pager [phone] till 1 pm If no answer page [phone] 04/04/2018, 12:47 PM

## 2018-04-05 ENCOUNTER — Other Ambulatory Visit: Payer: Medicare Other

## 2018-04-05 LAB — CBC WITH DIFFERENTIAL/PLATELET
Abs Immature Granulocytes: 0.18 10*3/uL — ABNORMAL HIGH (ref 0.00–0.07)
Basophils Absolute: 0 10*3/uL (ref 0.0–0.1)
Basophils Relative: 0 %
EOS PCT: 0 %
Eosinophils Absolute: 0 10*3/uL (ref 0.0–0.5)
HCT: 25.3 % — ABNORMAL LOW (ref 36.0–46.0)
HEMOGLOBIN: 7.9 g/dL — AB (ref 12.0–15.0)
Immature Granulocytes: 1 %
Lymphocytes Relative: 8 %
Lymphs Abs: 1 10*3/uL (ref 0.7–4.0)
MCH: 25.9 pg — ABNORMAL LOW (ref 26.0–34.0)
MCHC: 31.2 g/dL (ref 30.0–36.0)
MCV: 83 fL (ref 80.0–100.0)
Monocytes Absolute: 0.7 10*3/uL (ref 0.1–1.0)
Monocytes Relative: 6 %
Neutro Abs: 11.4 10*3/uL — ABNORMAL HIGH (ref 1.7–7.7)
Neutrophils Relative %: 85 %
Platelets: 292 10*3/uL (ref 150–400)
RBC: 3.05 MIL/uL — ABNORMAL LOW (ref 3.87–5.11)
RDW: 15.9 % — ABNORMAL HIGH (ref 11.5–15.5)
WBC: 13.4 10*3/uL — ABNORMAL HIGH (ref 4.0–10.5)
nRBC: 0 % (ref 0.0–0.2)

## 2018-04-05 LAB — BASIC METABOLIC PANEL
Anion gap: 12 (ref 5–15)
BUN: 50 mg/dL — ABNORMAL HIGH (ref 8–23)
CALCIUM: 9.1 mg/dL (ref 8.9–10.3)
CHLORIDE: 108 mmol/L (ref 98–111)
CO2: 19 mmol/L — ABNORMAL LOW (ref 22–32)
Creatinine, Ser: 2.1 mg/dL — ABNORMAL HIGH (ref 0.44–1.00)
GFR calc Af Amer: 27 mL/min — ABNORMAL LOW (ref >60)
GFR, EST NON AFRICAN AMERICAN: 23 mL/min — AB (ref >60)
Glucose, Bld: 198 mg/dL — ABNORMAL HIGH (ref 70–99)
Potassium: 4.4 mmol/L (ref 3.5–5.1)
Sodium: 139 mmol/L (ref 135–145)

## 2018-04-05 LAB — ANCA TITERS
Atypical P-ANCA titer: <1:20 {titer}
C-ANCA: <1:20 {titer}

## 2018-04-05 LAB — GLUCOSE, CAPILLARY
Glucose-Capillary: 110 mg/dL — ABNORMAL HIGH (ref 70–99)
Glucose-Capillary: 135 mg/dL — ABNORMAL HIGH (ref 70–99)
Glucose-Capillary: 173 mg/dL — ABNORMAL HIGH (ref 70–99)
Glucose-Capillary: 207 mg/dL — ABNORMAL HIGH (ref 70–99)
Glucose-Capillary: 223 mg/dL — ABNORMAL HIGH (ref 70–99)
Glucose-Capillary: 230 mg/dL — ABNORMAL HIGH (ref 70–99)
Glucose-Capillary: 288 mg/dL — ABNORMAL HIGH (ref 70–99)

## 2018-04-05 LAB — PROCALCITONIN: Procalcitonin: 0.1 ng/mL

## 2018-04-05 LAB — RHEUMATOID FACTOR: Rheumatoid fact SerPl-aCnc: <10 IU/mL (ref 0.0–13.9)

## 2018-04-05 MED ORDER — AMOXICILLIN-POT CLAVULANATE 500-125 MG PO TABS
500.0000 mg | ORAL_TABLET | Freq: Two times a day (BID) | ORAL | Status: DC
  Administered 2018-04-05 – 2018-04-07 (×4): 500 mg via ORAL
  Filled 2018-04-05 (×5): qty 1

## 2018-04-05 NOTE — Care Management Important Message (Signed)
Important Message  Patient Details  Name: April Branch MRN: [id number] Date of Birth: 1945/05/06   Medicare Important Message Given:  Yes    Barb Merino Leatta Alewine 04/05/2018, 4:45 PM

## 2018-04-05 NOTE — Progress Notes (Signed)
Patient ambulated in the hall on HI [place] 8LPM O2 sat dropped to the 80's during ambulation, patient returned to the room and encouraged to sit in the recliner. O2 sat now greater than 90% on 7 LPM of oxygen. Will continue to monitor.

## 2018-04-05 NOTE — Progress Notes (Signed)
Inpatient Diabetes Program Recommendations  AACE/ADA: New Consensus Statement on Inpatient Glycemic Control (2015)  Target Ranges:  Prepandial:   less than 140 mg/dL      Peak postprandial:   less than 180 mg/dL (1-2 hours)      Critically ill patients:  140 - 180 mg/dL   Results for NEIDRA, GIRVAN (MRN [id number]) as of 04/05/2018 10:44  Ref. Range 04/04/2018 04:42 04/04/2018 09:35 04/04/2018 11:26 04/04/2018 16:29 04/04/2018 20:48 04/05/2018 00:12 04/05/2018 04:55 04/05/2018 08:41  Glucose-Capillary Latest Ref Range: 70 - 99 mg/dL 217 (H)  Novolog 3 units 179 (H)  Novolog 2 units 214 (H)  Novolog 6 units 299 (H)  Novolog 8 units 328 (H)  Novolog 7 units  Lantus 10 units @ 22:10 230 (H)  Novolog 3 units 173 (H)  Novolog 2 units 135 (H)  Novolog 4 units   Review of Glycemic Control  Diabetes history: DM2 Outpatient Diabetes medications: Omni-Pod with Humalog Current orders for Inpatient glycemic control: Lantus 10 units QHS, Novolog 3 units TID with meals, Novolog 0-9 units Q4H; Prednisone 40 mg QAM  Inpatient Diabetes Program Recommendations:  Insulin-Basal: Please consider increasing Lantus to 14 units QHS.  Thanks, Barnie Alderman, RN, MSN, CDE Diabetes Coordinator Inpatient Diabetes Program [phone] (Team Pager from 8am to 5pm)

## 2018-04-05 NOTE — Progress Notes (Signed)
PROGRESS NOTE  April CHARRIER DTO:[id number] DOB: Nov 28, 1945 DOA: 04/02/2018 PCP: [name], MD  HPI/Recap of past 24 hours:  o2 dropped to the 80's while on HFNC 8liter  No hypoxic on HFNC 5liter at rest,  I did not hear cough today,  no fever, she denies chest pain, no edema    Assessment/Plan: Active Problems:   Hypothyroidism   Insulin dependent type 2 diabetes mellitus, controlled (HCC)   DISORDER, TOBACCO USE   Essential hypertension   CAD (coronary artery disease)   Hyperlipidemia   Chronic pain syndrome   Acute kidney injury superimposed on CKD (HCC)   Respiratory failure with hypoxia (HCC)   Acute on chronic respiratory failure with hypoxia (HCC)   Acute on chronic diastolic CHF (congestive heart failure) ([place])   HCAP (healthcare-associated pneumonia)   Insulin dependent diabetes mellitus ([place])   Lobar pneumonia ([place])   Acute urinary retention  Acute on chronic hypoxic respiratory failure -she was hospitalized for the same required bipap in the hospital, she subsequently improved and was sent home on home 02 and lasix.  she missed her follow up appointment with pulm on 2/6 -persistent bilateral pulmonary infiltrate, no fever, does has leukocytosis, though she is aaox3, does not appear septic, troponin negative, abg with normal ph, no co2 retention.  -mrsa screening negative, urine strep pneumo antigen negative, respiratory viral panel in process, sputum culture pending collection, procalcitonin level 0.1 -ana/ anca/RF in process -high resolution CT scan concerning for bilateral bronchopneumonia,low probability  VQ scan  -continue nebs/steroids, d/c vanc, she has no fever, but does has leukocytosis, order blood culture /urine culture no growth, d/c  cefepime , change to augmentin --pulmonology consulted, pneumonia vs ILD, pulm recommend discharge on slow steroids taper once patient is able to wean to nasal cannula, detail please refer note from dr Elsworth Soho team on  2/10  AKI on CKDIII with mild metabolic acidosis -cr baseline 1.5 -cr on presentation is 2.6 -ua with rare bacteria, urine culture in process, she has urinary retention, required in and out cath x1 with 1000cc removed, bladder scan qshift, start flomax, increase activity , avoid constipation, d/c toviaz  -cr slowing improving , today is 2.1  H/o diastolic CHF She reports weight gain , base weight is 156, she weight 168 on admission, Hard to assess volume She does not have leg edema, but has bilateral lung infiltrate and report weight gain She is recently started on lasix from last discharge, lasix is continued at home dose.  HTN; bp stable on continue toprol  Hypomagnesemia: Replaced    Insulin dependent dm2 a1c 7.9  On insulin pump at home , pump is turned off since admission Start lantus qhs, meal coverage, ssi, blood glucose likely will be elevated while on iv steroids, adjust insulin accordingly   H/o COPD: recently started on home o2,  Currently no wheezing She reports quick smoking several weeks ago  h/o CVA/TIA with mild left hemiparesis/memory impairment -on asa 325, betablocker , not on statin, (LDL 64) -she reports difficulty remembering new info since after the stroke  H/o PAD on pletal  H/o renal cancer? Vs adrenal mass, unknown details  On chronic florinef, unclear detail,she is followed with endocrinology regularly, her next appoint with  Endocrinology Dr [name] on 2/12   Hypothyrodiism: Continue synthroid  Anxiety/depression/Insomnia  Reports has been on ativan for 85yrs in the past, she was taken off ativan, currently on pamelor, cymbalta, seroquel ,hydroxyzine  Chronic pain: on norco , neurontin  Code Status: DNR, confirmed with patient  Family Communication: patient and sister over the phone  Disposition Plan: remain in stepdown, currently getting nightly bipap, on HFNC  May Need cpap at night, need refer to outpatient sleep study     Consultants:  pulmonology  Procedures:  bipap  Antibiotics:  vanc on presentation to 2/9  cefepime on presentation to 2/10  augmentin from 2/10 to    Objective: BP (!) 134/49 (BP Location: Left Arm)   Pulse (!) 102   Temp 97.7 F (36.5 C) (Axillary)   Resp (!) 24   Ht 5\' 1"  (1.549 m)   Wt 78.4 kg   SpO2 96%   BMI 32.66 kg/m   Intake/Output Summary (Last 24 hours) at 04/05/2018 1637 Last data filed at 04/05/2018 1250 Gross per 24 hour  Intake 600 ml  Output 200 ml  Net 400 ml   Filed Weights   04/02/18 1720 04/03/18 0323  Weight: 76.2 kg 78.4 kg    Exam: Patient is examined daily including today on 04/05/2018, exams remain the same as of yesterday except that has changed    General:  NAD  Cardiovascular: RRR  Respiratory: mild bibasilar crackles R> left, overall improved aeration, no wheezing  Abdomen: Soft/ND/NT, positive BS  Musculoskeletal: No Edema  Neuro: alert, oriented   Data Reviewed: Basic Metabolic Panel: Recent Labs  Lab 04/02/18 1805 04/02/18 2048 04/02/18 2151 04/03/18 0407 04/04/18 0307 04/05/18 0333  NA 136 138 136 136 137 139  K 4.5 4.2 4.3 4.5 4.7 4.4  CL 103  --   --  104 109 108  CO2 18*  --   --  17* 19* 19*  GLUCOSE 242*  --   --  270* 242* 198*  BUN 28*  --   --  30* 39* 50*  CREATININE 2.61*  --   --  2.43* 2.36* 2.10*  CALCIUM 8.7*  --   --  8.7* 8.8* 9.1  MG  --   --   --  1.6* 2.4  --   PHOS  --   --   --  4.7*  --   --    Liver Function Tests: Recent Labs  Lab 04/03/18 0407  AST 13*  ALT 13  ALKPHOS 107  BILITOT 0.5  PROT 7.1  ALBUMIN 3.0*   No results for input(s): LIPASE, AMYLASE in the last 168 hours. No results for input(s): AMMONIA in the last 168 hours. CBC: Recent Labs  Lab 04/02/18 1805 04/02/18 2048 04/02/18 2151 04/03/18 0407 04/04/18 0307 04/05/18 0333  WBC 15.1*  --   --  16.0* 18.1* 13.4*  NEUTROABS 13.3*  --   --   --  16.8* 11.4*  HGB 9.5* 10.2* 9.2* 9.7* 8.4* 7.9*  HCT 31.0*  30.0* 27.0* 29.5* 26.3* 25.3*  MCV 84.5  --   --  81.9 83.0 83.0  PLT 341  --   --  356 313 292   Cardiac Enzymes:   Recent Labs  Lab 04/02/18 2202 04/03/18 0407 04/03/18 1015  TROPONINI <0.03 <0.03 <0.03   BNP (last 3 results) Recent Labs    03/16/18 0911 04/02/18 1805  BNP 279.6* 331.9*    ProBNP (last 3 results) No results for input(s): PROBNP in the last 8760 hours.  CBG: Recent Labs  Lab 04/05/18 0012 04/05/18 0455 04/05/18 0841 04/05/18 1128 04/05/18 1615  GLUCAP 230* 173* 135* 110* 207*    Recent Results (from the past 240 hour(s))  Respiratory Panel by PCR  Status: None   Collection Time: 04/03/18  1:26 PM  Result Value Ref Range Status   Adenovirus NOT DETECTED NOT DETECTED Final   Coronavirus 229E NOT DETECTED NOT DETECTED Final    Comment: (NOTE) The Coronavirus on the Respiratory Panel, DOES NOT test for the novel  Coronavirus (2019 nCoV)    Coronavirus HKU1 NOT DETECTED NOT DETECTED Final   Coronavirus NL63 NOT DETECTED NOT DETECTED Final   Coronavirus OC43 NOT DETECTED NOT DETECTED Final   Metapneumovirus NOT DETECTED NOT DETECTED Final   Rhinovirus / Enterovirus NOT DETECTED NOT DETECTED Final   Influenza A NOT DETECTED NOT DETECTED Final   Influenza B NOT DETECTED NOT DETECTED Final   Parainfluenza Virus 1 NOT DETECTED NOT DETECTED Final   Parainfluenza Virus 2 NOT DETECTED NOT DETECTED Final   Parainfluenza Virus 3 NOT DETECTED NOT DETECTED Final   Parainfluenza Virus 4 NOT DETECTED NOT DETECTED Final   Respiratory Syncytial Virus NOT DETECTED NOT DETECTED Final   Bordetella pertussis NOT DETECTED NOT DETECTED Final   Chlamydophila pneumoniae NOT DETECTED NOT DETECTED Final   Mycoplasma pneumoniae NOT DETECTED NOT DETECTED Final    Comment: Performed at [facility], [place] [address] [place], [place] [address]  MRSA PCR Screening     Status: None   Collection Time: 04/03/18  1:26 PM  Result Value Ref Range Status   MRSA by PCR  NEGATIVE NEGATIVE Final    Comment:        The GeneXpert MRSA Assay (FDA approved for NASAL specimens only), is one component of a comprehensive MRSA colonization surveillance program. It is not intended to diagnose MRSA infection nor to guide or monitor treatment for MRSA infections. Performed at [facility], [place] [address] [place], [place] [address]   Culture, Urine     Status: None   Collection Time: 04/03/18  4:34 PM  Result Value Ref Range Status   Specimen Description URINE, CLEAN CATCH  Final   Special Requests NONE  Final   Culture   Final    NO GROWTH Performed at [facility], [place] [address] [place], [place] [address]    Report Status 04/04/2018 FINAL  Final  Culture, blood (routine x 2)     Status: None (Preliminary result)   Collection Time: 04/04/18  9:30 AM  Result Value Ref Range Status   Specimen Description BLOOD LEFT FOREARM  Final   Special Requests   Final    BOTTLES DRAWN AEROBIC AND ANAEROBIC Blood Culture adequate volume   Culture   Final    NO GROWTH < 24 HOURS Performed at [facility], [place] [address], [place], [place] [address]    Report Status PENDING  Incomplete  Culture, blood (routine x 2)     Status: None (Preliminary result)   Collection Time: 04/04/18 10:00 AM  Result Value Ref Range Status   Specimen Description BLOOD RIGHT ANTECUBITAL  Final   Special Requests   Final    BOTTLES DRAWN AEROBIC AND ANAEROBIC Blood Culture adequate volume   Culture   Final    NO GROWTH < 24 HOURS Performed at [facility], [place] [address], [place], [place] [address]    Report Status PENDING  Incomplete     Studies: No results found.  Scheduled Meds: . amoxicillin-clavulanate  500 mg Oral BID  . aspirin  325 mg Oral Daily  . cilostazol  100 mg Oral BID AC  . DULoxetine  60  mg Oral Daily  . enoxaparin (LOVENOX) injection  30 mg Subcutaneous Q24H  . fludrocortisone  0.1 mg Oral Once per day on Mon Tue Wed  . fluticasone  furoate-vilanterol  1 puff Inhalation Daily  . furosemide  20 mg Oral Daily  . gabapentin  300 mg Oral BID  . guaiFENesin  600 mg Oral BID  . insulin aspart  0-9 Units Subcutaneous Q4H  . insulin aspart  3 Units Subcutaneous TID WC  . insulin glargine  10 Units Subcutaneous QHS  . ipratropium-albuterol  3 mL Nebulization QID  . levothyroxine  125 mcg Oral QAC breakfast  . metoprolol succinate  25 mg Oral Daily  . nicotine  14 mg Transdermal Daily  . nortriptyline  75 mg Oral QHS  . polyethylene glycol  17 g Oral Daily  . predniSONE  40 mg Oral Q breakfast  . QUEtiapine  100 mg Oral QHS  . senna-docusate  1 tablet Oral BID  . sodium chloride flush  3 mL Intravenous Q12H  . tamsulosin  0.4 mg Oral QPC supper  . Vitamin D (Ergocalciferol)  50,000 Units Oral Q Mon    Continuous Infusions: . sodium chloride       Time spent: 63mins I have personally reviewed and interpreted on  04/05/2018 daily labs, tele strips, imagings as discussed above under date review session and assessment and plans.  I reviewed all nursing notes, pharmacy notes,  vitals, pertinent old records  I have discussed plan of care as described above with RN , patient  on 04/05/2018   Florencia Reasons MD, PhD  [name] Pager [phone]. If 7PM-7AM, please contact night-coverage at [email or web address], password [facility] 04/05/2018, 4:37 PM  LOS: 3 days

## 2018-04-05 NOTE — Progress Notes (Signed)
OT Cancellation Note  Patient Details Name: April Branch MRN: [id number] DOB: 08-12-1945   Cancelled Treatment:    Reason Eval/Treat Not Completed: Other (comment). Pt on cell phone crying upon arrival, per RN pt just found out that her step daughter passed away and is not up to activity with therapy right now. Will check back on pt next appropriate/available time  Britt Bottom 04/05/2018, 12:42 PM

## 2018-04-06 ENCOUNTER — Encounter: Payer: Medicare Other | Admitting: Nutrition

## 2018-04-06 DIAGNOSIS — N189 Chronic kidney disease, unspecified: Secondary | ICD-10-CM

## 2018-04-06 DIAGNOSIS — E119 Type 2 diabetes mellitus without complications: Secondary | ICD-10-CM

## 2018-04-06 DIAGNOSIS — I2583 Coronary atherosclerosis due to lipid rich plaque: Secondary | ICD-10-CM

## 2018-04-06 DIAGNOSIS — N179 Acute kidney failure, unspecified: Secondary | ICD-10-CM

## 2018-04-06 DIAGNOSIS — I10 Essential (primary) hypertension: Secondary | ICD-10-CM

## 2018-04-06 DIAGNOSIS — E7800 Pure hypercholesterolemia, unspecified: Secondary | ICD-10-CM

## 2018-04-06 DIAGNOSIS — Z794 Long term (current) use of insulin: Secondary | ICD-10-CM

## 2018-04-06 DIAGNOSIS — J181 Lobar pneumonia, unspecified organism: Principal | ICD-10-CM

## 2018-04-06 DIAGNOSIS — J189 Pneumonia, unspecified organism: Secondary | ICD-10-CM

## 2018-04-06 DIAGNOSIS — I2510 Atherosclerotic heart disease of native coronary artery without angina pectoris: Secondary | ICD-10-CM

## 2018-04-06 DIAGNOSIS — R338 Other retention of urine: Secondary | ICD-10-CM

## 2018-04-06 DIAGNOSIS — G894 Chronic pain syndrome: Secondary | ICD-10-CM

## 2018-04-06 DIAGNOSIS — F17200 Nicotine dependence, unspecified, uncomplicated: Secondary | ICD-10-CM

## 2018-04-06 LAB — CBC WITH DIFFERENTIAL/PLATELET
Abs Immature Granulocytes: 0.18 10*3/uL — ABNORMAL HIGH (ref 0.00–0.07)
Basophils Absolute: 0 10*3/uL (ref 0.0–0.1)
Basophils Relative: 0 %
Eosinophils Absolute: 0.1 10*3/uL (ref 0.0–0.5)
Eosinophils Relative: 1 %
HCT: 25.2 % — ABNORMAL LOW (ref 36.0–46.0)
Hemoglobin: 8.1 g/dL — ABNORMAL LOW (ref 12.0–15.0)
Immature Granulocytes: 2 %
Lymphocytes Relative: 15 %
Lymphs Abs: 1.7 10*3/uL (ref 0.7–4.0)
MCH: 26.8 pg (ref 26.0–34.0)
MCHC: 32.1 g/dL (ref 30.0–36.0)
MCV: 83.4 fL (ref 80.0–100.0)
MONO ABS: 0.6 10*3/uL (ref 0.1–1.0)
MONOS PCT: 5 %
Neutro Abs: 8.2 10*3/uL — ABNORMAL HIGH (ref 1.7–7.7)
Neutrophils Relative %: 77 %
Platelets: 284 10*3/uL (ref 150–400)
RBC: 3.02 MIL/uL — ABNORMAL LOW (ref 3.87–5.11)
RDW: 16 % — ABNORMAL HIGH (ref 11.5–15.5)
WBC: 10.7 10*3/uL — ABNORMAL HIGH (ref 4.0–10.5)
nRBC: 0 % (ref 0.0–0.2)

## 2018-04-06 LAB — COMPREHENSIVE METABOLIC PANEL
ALT: 12 U/L (ref 0–44)
AST: 10 U/L — ABNORMAL LOW (ref 15–41)
Albumin: 2.9 g/dL — ABNORMAL LOW (ref 3.5–5.0)
Alkaline Phosphatase: 69 U/L (ref 38–126)
Anion gap: 11 (ref 5–15)
BUN: 42 mg/dL — ABNORMAL HIGH (ref 8–23)
CALCIUM: 9.1 mg/dL (ref 8.9–10.3)
CO2: 22 mmol/L (ref 22–32)
Chloride: 108 mmol/L (ref 98–111)
Creatinine, Ser: 1.81 mg/dL — ABNORMAL HIGH (ref 0.44–1.00)
GFR calc Af Amer: 32 mL/min — ABNORMAL LOW (ref >60)
GFR calc non Af Amer: 28 mL/min — ABNORMAL LOW (ref >60)
Glucose, Bld: 142 mg/dL — ABNORMAL HIGH (ref 70–99)
Potassium: 4 mmol/L (ref 3.5–5.1)
Sodium: 141 mmol/L (ref 135–145)
Total Bilirubin: 0.4 mg/dL (ref 0.3–1.2)
Total Protein: 6.3 g/dL — ABNORMAL LOW (ref 6.5–8.1)

## 2018-04-06 LAB — GLUCOSE, CAPILLARY
GLUCOSE-CAPILLARY: 235 mg/dL — AB (ref 70–99)
Glucose-Capillary: 133 mg/dL — ABNORMAL HIGH (ref 70–99)
Glucose-Capillary: 144 mg/dL — ABNORMAL HIGH (ref 70–99)
Glucose-Capillary: 180 mg/dL — ABNORMAL HIGH (ref 70–99)
Glucose-Capillary: 220 mg/dL — ABNORMAL HIGH (ref 70–99)
Glucose-Capillary: 330 mg/dL — ABNORMAL HIGH (ref 70–99)
Glucose-Capillary: 343 mg/dL — ABNORMAL HIGH (ref 70–99)

## 2018-04-06 LAB — MAGNESIUM: Magnesium: 2.1 mg/dL (ref 1.7–2.4)

## 2018-04-06 LAB — PHOSPHORUS: Phosphorus: 4.5 mg/dL (ref 2.5–4.6)

## 2018-04-06 MED ORDER — IPRATROPIUM-ALBUTEROL 0.5-2.5 (3) MG/3ML IN SOLN
3.0000 mL | Freq: Three times a day (TID) | RESPIRATORY_TRACT | Status: DC
  Administered 2018-04-06 – 2018-04-07 (×3): 3 mL via RESPIRATORY_TRACT
  Filled 2018-04-06 (×3): qty 3

## 2018-04-06 MED ORDER — INSULIN GLARGINE 100 UNIT/ML ~~LOC~~ SOLN
14.0000 [IU] | Freq: Every day | SUBCUTANEOUS | Status: DC
  Administered 2018-04-06: 14 [IU] via SUBCUTANEOUS
  Filled 2018-04-06 (×3): qty 0.14

## 2018-04-06 NOTE — Progress Notes (Signed)
Patient o2 sats on 6L ambulating was 90-95%. Tried to wean patient down to 4L while ambulating but o2 sats dropped to 79%.

## 2018-04-06 NOTE — Progress Notes (Signed)
Occupational Therapy Treatment Patient Details Name: April Branch MRN: [id number] DOB: [birth year] Today's Date: 04/06/2018    History of present illness April Branch is a 73yo F who [facility] on [facility] c hypoxia and dyspnea, Pt was recently admitted in Jan and DC with home O2. PMH: hypoTSH, HTN, CKD3, PVD, CAD, IDDM2.    OT comments  Pt making good progress with functional goals. Pt's O2 SATs remained >90% during ADL tasks in he room. OT will continue to follow acutely  Follow Up Recommendations  Supervision - Intermittent    Equipment Recommendations  3 in 1 bedside commode;Other (comment)(reacher)    Recommendations for Other Services      Precautions / Restrictions Precautions Precautions: Fall Precaution Comments: monitor O2 sats Restrictions Weight Bearing Restrictions: No       Mobility Bed Mobility               General bed mobility comments: pt in recliner upon arrival  Transfers Overall transfer level: Modified independent Equipment used: None Transfers: Sit to/from Stand Sit to Stand: Modified independent (Device/Increase time)              Balance Overall balance assessment: Modified Independent Sitting-balance support: No upper extremity supported;Feet supported Sitting balance-Leahy Scale: Good     Standing balance support: No upper extremity supported;During functional activity Standing balance-Leahy Scale: Good                             ADL either performed or assessed with clinical judgement   ADL Overall ADL's : Needs assistance/impaired     Grooming: Wash/dry hands;Oral care;Supervision/safety;Standing;Wash/dry face   Upper Body Bathing: Independent   Lower Body Bathing: Supervison/ safety Lower Body Bathing Details (indicate cue type and reason): simulated Upper Body Dressing : Independent   Lower Body Dressing: Supervision/safety   Toilet Transfer: Ambulation;Modified Independent;BSC   Toileting-  Clothing Manipulation and Hygiene: Supervision/safety;Sit to/from stand       Functional mobility during ADLs: Rolling walker;Supervision/safety;Modified independent General ADL Comments: pt maintained O2 SATs >90% during ADL and simulated ADL tasks     Vision Patient Visual Report: No change from baseline     Perception     Praxis      Cognition Arousal/Alertness: Awake/alert Behavior During Therapy: WFL for tasks assessed/performed Overall Cognitive Status: Within Functional Limits for tasks assessed                                          Exercises     Shoulder Instructions       General Comments      Pertinent Vitals/ Pain       Pain Assessment: No/denies pain Faces Pain Scale: No hurt Pain Intervention(s): Monitored during session  Home Living                                          Prior Functioning/Environment              Frequency  Min 2X/week        Progress Toward Goals  OT Goals(current goals can now be found in the care plan section)  Progress towards OT goals: Progressing toward goals     Plan Discharge plan remains appropriate;Frequency remains  appropriate    Co-evaluation                 AM-PAC OT "6 Clicks" Daily Activity     Outcome Measure   Help from another person eating meals?: None Help from another person taking care of personal grooming?: None Help from another person toileting, which includes using toliet, bedpan, or urinal?: None Help from another person bathing (including washing, rinsing, drying)?: A Little Help from another person to put on and taking off regular upper body clothing?: None Help from another person to put on and taking off regular lower body clothing?: A Little 6 Click Score: 22    End of Session Equipment Utilized During Treatment: Oxygen;Gait belt;Other (comment)(BSC)  OT Visit Diagnosis: Muscle weakness (generalized) (M62.81);Unsteadiness on feet  (R26.81)   Activity Tolerance Patient tolerated treatment well   Patient Left with call bell/phone within reach;in chair   Nurse Communication          Time: 8682-5749 OT Time Calculation (min): 29 min  Charges: OT General Charges $OT Visit: 1 Visit OT Treatments $Self Care/Home Management : 8-22 mins $Therapeutic Activity: 8-22 mins     Britt Bottom 04/06/2018, 1:03 PM

## 2018-04-06 NOTE — Progress Notes (Signed)
PROGRESS NOTE    April Branch  GYI:[id number] DOB: 03-26-1945 DOA: 04/02/2018 PCP: [name], MD   Brief Narrative:  HPI per Dr. Toy Baker on 04/02/2018 April Branch is a 73 y.o. female with medical history significant of COPD anemia, anxiety, nonobstructive CAD, colon polyps, COPD, depression, fibromyalgia, GERD, headache, history of TIA, HPV, HLD, HTN, type 2 diabetes on insulin pump, peripheral neuropathy, peripheral vascular disease, history of renal cancer, history of vertigo    Presented with she was walking in her home and got more short of breath no lightheadedness or chest pain associated no palpitations nonproductive cough she thought it was panic attack she felt anxious.  Recently admitted left-sided weakness and numbness was having CHF exacerbation requiring admission to ICU on BiPAP MRI showed no CVA felt to be more of a TIA. Echogram done showing EF 60-65% The time of discharge her oxygen requirement was down to 1 L which was new for her and she was discharged home with plan to follow-up.  Etiology for acute respiratory failure felt to be multifactorial including pulmonary edema and COPD exacerbation or underlying interstitial lung. Responded well to IV Solu-Medrol was treated with Brovana Pulmicort nebulizers completed 5 days of doxycycline  disease unfortunately patient did not follow-up after discharge. Patient was encouraged to stop smoking  During last admission thought to maybe have adrenal insufficiency she was normotensive but the low a.m. cortisol she was on Florinef and plan was to have follow-up with endocrinology. Had hematuria during last admission with plan to have this repeated and see if persistent then needs to follow-up  **Seen by Pulmonary and they feel her symptoms are secondary to a Multilobar PNA and are recommending weaning O2 and D/C'ing on a slow Prednisone Taper.  Assessment & Plan:   Active Problems:   Hypothyroidism  Insulin dependent type 2 diabetes mellitus, controlled (HCC)   DISORDER, TOBACCO USE   Essential hypertension   CAD (coronary artery disease)   Hyperlipidemia   Chronic pain syndrome   Acute kidney injury superimposed on CKD (HCC)   Respiratory failure with hypoxia (HCC)   Acute on chronic respiratory failure with hypoxia (HCC)   Acute on chronic diastolic CHF (congestive heart failure) ([place])   HCAP (healthcare-associated pneumonia)   Insulin dependent diabetes mellitus ([place])   Lobar pneumonia ([place])   Acute urinary retention  Acute on chronic hypoxic respiratory failure in the setting of Multilobar PNA, improving  -She was hospitalized for the same; Required bipap in the hospital, she subsequently improved and was sent home on home O2 and Lasix.  She missed her follow up appointment with pulm on 2/6 -Persistent bilateral pulmonary infiltrate, no fever, does has leukocytosis (Trending down and went from 18.1 -> 13.4 -> 10.7), She is aaox3, does not appear septic, troponin negative, abg with normal ph, no co2 retention.  -MRSA screening negative, urine strep pneumo antigen negative,  -Respiratory Viral Panel Negative, sputum culture pending collection, -Respiratory Virus Pane went from <0.10 -> 0.11 -> 0.10 -ANA negative; ANCA <1:20; RA Latex Turbid <10.0 -High resolution CT scan concerning for bilateral bronchopneumonia, low probability VQ scan  -Continue Nebs with Breo Ellipta and DuoNeb TID -Steroids Weaned and now on Prednisone 40 mg daily with  Pulmonary recommending a slow taper by 10 mg q5 days until follow up in Pulmonary Clinic in 2 weeks. Pulmonary also recommending keeping on Lasix 20 mg daily to aim for a weight around 72 kg -D/C'd IV Vancomycin and IV Cefepime as she has  no fever, but does has leukocytosis (trending down), Blood culture /urine culture no growth; Changed to po Augmentin -Pulmonology consulted, pneumonia vs ILD but they feel that the High Resolution CT seems to  represent Multilobar PNA rather than ILD -Smoking Cessastion Counseling given  -Pulmonary recommend discharge on slow steroids taper once patient is able to wean to nasal cannula, detail please refer note from Dr. Elsworth Soho team on 2/10 -Will need an outpatient Sleep Study   AKI on CKD Stage III with mild Metabolic Acidosis, improving -Baseline Cr is around 1.5 -Cr on presentation was 2.6; BUN Cr has now improved to  42/1.81; -CO2 is now 22 -U/A with rare bacteria, urine culture showed No Growth, -She had urinary retention, required in and out cath x1 with 1000cc removed -C/w bladder scan qshift and continue with Tamsulosin 0.4 mg Daily after supper -Continue to Inncrease activity , avoid constipation, D/C'd Lisbeth Ply  -Continue to Monitor and Trend Renal Fxn  H/o Diastolic CHF -She reported weight gain, base weight is 156, she weight 168 on admission, -Hard to assess volume due to her obesity S-he does not have leg edema, but has bilateral lung infiltrate and report weight gain -She is recently started on lasix from last discharge, lasix is continued at home dose at 20 mg po Daily -Strict I's/Os and Daily Weights; She is up from 168 -> 172 but Weight is not been done in Days and she is -1.154 Liters since Admission -Continue to Monitor for Volume Overload   HTN -BP currently stable and is 118/53 -Continue Metoprolol Succinate 25 mg po Daily   Hypomagnesemia -Improved. Mag Level was 2.1 -Continue to Monitor and Replete as Necessary  -Repeat Mag Level in AM   H/o COPD -Recently started on Home O2 and wears 1 Liter Chronically,  -Currently no wheezing -She reports quitting smoking several weeks ago  Tobacco Abuse/Smoker -Admits to quitting smoking several weeks ago -Smoking Cessation Counseling given -C/w Nicotine 14 mg TD Patch    H/o CVA/TIA with mild left hemiparesis/memory impairment -On ASA 325 mg, betablocker , not on statin, (LDL 64) -she reports difficulty remembering  new info since after the stroke  Hx of PAD  -C/w Cilostazol 100 mg po BID and ASA 325 mg po Daily   H/o renal cancer? (2010); ?Adrenal mass with Suspected Adrenal Insufficieny , unknown details -Per Documentation had Laparoscopy biopsy and cryoablation in 7/08 by Dr. Diona Fanti for her Renal Issues -On chronic Fludricortisone 0.1 mg po MTW for Orthostasis unclear detail, she is followed with Endocrinology regularly -Her next appoint with  Endocrinology Dr [name] on 2/12  Leukocytosis -In the Setting of Steroid Demargination -Improving; WBC went from 13.4 -> 10.7 -Continue to [place] for S/Sx of Infection; Currently on Abx with Augmentin 500-125 mg po Daily  -Repeat CBC in AM   Hypothyrodism -Continue Levothyroxine 125 mcg po Daily   Anxiety/Depression/Insomnia  -Reports has been on ativan for 20 yrs in the past, she was taken off ativan, -Currently on Nortriptyline 75 mg qHS, Duloxetine 60 mg po Daily, Quetiapine 100 mg po qHS, Hydroxyzine 25 mg po q6hprn  Chronic Pain Syndrome -C/w Duloxetine 60 mg po qHS, Gabapenting 300 mg po BID and Hydrocodone-Acetaminophen 10-325 mg po q4hprn  Insulin Dependent Diabetes Mellitus Type 2  -HbA1c of 7.9 on 04/03/2018 -CBG's ranging from 110-288 -On insulin pump at home; pump is turned off since admission -Started on Lantus 10 units qhs but increased to 14 units, Sensitive Novolog SSI q4h and Meal Coverage with 3  Units TID -Blood glucose likely will be elevated while on IV steroids but changed to po Prednisone 40 mg po Daily -Diabetes Education Coordinator Consulted and recommended increasing Lantus to 14 units qHS -Continue to adjust insulin accordingly   Obesity -Estimated body mass index is 32.66 kg/m as calculated from the following:   Height as of this encounter: 5\' 1"  (1.549 m).   Weight as of this encounter: 78.4 kg. -Weight Loss Counseling Given   DVT prophylaxis: Enoxaparin 30 mg sq q24h Code Status: FULL CODE Family  Communication: No family present at bedside Disposition Plan: [place] PT when O2 Requirement is reduced   Consultants:   Pulmonary    Procedures: None   Antimicrobials:  Anti-infectives (From admission, onward)   Start     Dose/Rate Route Frequency Ordered Stop   04/05/18 2200  amoxicillin-clavulanate (AUGMENTIN) 500-125 MG per tablet 500 mg     500 mg Oral 2 times daily 04/05/18 1622     04/03/18 2100  ceFEPIme (MAXIPIME) 1 g in sodium chloride 0.9 % 100 mL IVPB  Status:  Discontinued     1 g 200 mL/hr over 30 Minutes Intravenous Every 24 hours 04/02/18 2139 04/05/18 1622   04/02/18 2230  vancomycin (VANCOCIN) 500 mg in sodium chloride 0.9 % 100 mL IVPB     500 mg 100 mL/hr over 60 Minutes Intravenous  Once 04/02/18 2139 04/03/18 0123   04/02/18 2130  vancomycin (VANCOCIN) IVPB 1000 mg/200 mL premix     1,000 mg 200 mL/hr over 60 Minutes Intravenous  Once 04/02/18 2123 04/03/18 0121   04/02/18 2130  ceFEPIme (MAXIPIME) 2 g in sodium chloride 0.9 % 100 mL IVPB     2 g 200 mL/hr over 30 Minutes Intravenous  Once 04/02/18 2123 04/03/18 0025     Subjective: And examined at bedside states that she is doing "okay".  Still having some shortness of breath but states is improved from when she came in.  No nausea or vomiting.  She was sad about her stepdaughter passing away only.  No other concerns or complaints at this time.  Objective: Vitals:   04/05/18 2200 04/06/18 0009 04/06/18 0730 04/06/18 0733  BP:  (!) 120/44    Pulse: 95 93    Resp: 19 19    Temp:  97.8 F (36.6 C)    TempSrc:  Axillary    SpO2: 96% 92% 95% 96%  Weight:      Height:        Intake/Output Summary (Last 24 hours) at 04/06/2018 0801 Last data filed at 04/05/2018 2028 Gross per 24 hour  Intake 720 ml  Output 700 ml  Net 20 ml   Filed Weights   04/02/18 1720 04/03/18 0323  Weight: 76.2 kg 78.4 kg   Examination: Physical Exam:  Constitutional: WN/WD obese Caucasian female in NAD and appears  calm but slightly uncomfortable  Eyes: Lids and conjunctivae normal, sclerae anicteric  ENMT: External Ears, Nose appear normal. Grossly normal hearing. Neck: Appears normal, supple, no cervical masses, normal ROM, no appreciable thyromegaly; no JVD Respiratory: Diminished to auscultation bilaterally, no wheezing, rales, rhonchi or crackles. Normal respiratory effort and patient is not tachypenic. No accessory muscle use. Wearing supplemental O2 via [place] Cardiovascular: RRR, no murmurs / rubs / gallops. S1 and S2 auscultated. Trace extremity edema. Abdomen: Soft, non-tender, non-distended. No masses palpated. No appreciable hepatosplenomegaly. Bowel sounds positive x4.  GU: Deferred. Musculoskeletal: No clubbing / cyanosis of digits/nails. No joint deformity upper and lower  extremities.  Skin: No rashes, lesions, ulcers on a limited skin evaluation No induration; Warm and dry.  Neurologic: CN 2-12 grossly intact with no focal deficits. Romberg sign and cerebellar reflexes not assessed.  Psychiatric: Normal judgment and insight. Alert and oriented x 3. Normal mood and appropriate affect.   Data Reviewed: I have personally reviewed following labs and imaging studies  CBC: Recent Labs  Lab 04/02/18 1805 04/02/18 2048 04/02/18 2151 04/03/18 0407 04/04/18 0307 04/05/18 0333  WBC 15.1*  --   --  16.0* 18.1* 13.4*  NEUTROABS 13.3*  --   --   --  16.8* 11.4*  HGB 9.5* 10.2* 9.2* 9.7* 8.4* 7.9*  HCT 31.0* 30.0* 27.0* 29.5* 26.3* 25.3*  MCV 84.5  --   --  81.9 83.0 83.0  PLT 341  --   --  356 313 295   Basic Metabolic Panel: Recent Labs  Lab 04/02/18 1805 04/02/18 2048 04/02/18 2151 04/03/18 0407 04/04/18 0307 04/05/18 0333  NA 136 138 136 136 137 139  K 4.5 4.2 4.3 4.5 4.7 4.4  CL 103  --   --  104 109 108  CO2 18*  --   --  17* 19* 19*  GLUCOSE 242*  --   --  270* 242* 198*  BUN 28*  --   --  30* 39* 50*  CREATININE 2.61*  --   --  2.43* 2.36* 2.10*  CALCIUM 8.7*  --   --  8.7*  8.8* 9.1  MG  --   --   --  1.6* 2.4  --   PHOS  --   --   --  4.7*  --   --    GFR: Estimated Creatinine Clearance: 23.6 mL/min (A) (by C-G formula based on SCr of 2.1 mg/dL (H)). Liver Function Tests: Recent Labs  Lab 04/03/18 0407  AST 13*  ALT 13  ALKPHOS 107  BILITOT 0.5  PROT 7.1  ALBUMIN 3.0*   No results for input(s): LIPASE, AMYLASE in the last 168 hours. No results for input(s): AMMONIA in the last 168 hours. Coagulation Profile: No results for input(s): INR, PROTIME in the last 168 hours. Cardiac Enzymes: Recent Labs  Lab 04/02/18 2202 04/03/18 0407 04/03/18 1015  TROPONINI <0.03 <0.03 <0.03   BNP (last 3 results) No results for input(s): PROBNP in the last 8760 hours. HbA1C: No results for input(s): HGBA1C in the last 72 hours. CBG: Recent Labs  Lab 04/05/18 1128 04/05/18 1615 04/05/18 2038 04/05/18 2346 04/06/18 0347  GLUCAP 110* 207* 223* 288* 180*   Lipid Profile: No results for input(s): CHOL, HDL, LDLCALC, TRIG, CHOLHDL, LDLDIRECT in the last 72 hours. Thyroid Function Tests: No results for input(s): TSH, T4TOTAL, FREET4, T3FREE, THYROIDAB in the last 72 hours. Anemia Panel: No results for input(s): VITAMINB12, FOLATE, FERRITIN, TIBC, IRON, RETICCTPCT in the last 72 hours. Sepsis Labs: Recent Labs  Lab 04/03/18 0836 04/04/18 0307 04/05/18 0333  PROCALCITON <0.10 0.11 0.10    Recent Results (from the past 240 hour(s))  Respiratory Panel by PCR     Status: None   Collection Time: 04/03/18  1:26 PM  Result Value Ref Range Status   Adenovirus NOT DETECTED NOT DETECTED Final   Coronavirus 229E NOT DETECTED NOT DETECTED Final    Comment: (NOTE) The Coronavirus on the Respiratory Panel, DOES NOT test for the novel  Coronavirus (2019 nCoV)    Coronavirus HKU1 NOT DETECTED NOT DETECTED Final   Coronavirus NL63 NOT DETECTED NOT DETECTED  Final   Coronavirus OC43 NOT DETECTED NOT DETECTED Final   Metapneumovirus NOT DETECTED NOT DETECTED  Final   Rhinovirus / Enterovirus NOT DETECTED NOT DETECTED Final   Influenza A NOT DETECTED NOT DETECTED Final   Influenza B NOT DETECTED NOT DETECTED Final   Parainfluenza Virus 1 NOT DETECTED NOT DETECTED Final   Parainfluenza Virus 2 NOT DETECTED NOT DETECTED Final   Parainfluenza Virus 3 NOT DETECTED NOT DETECTED Final   Parainfluenza Virus 4 NOT DETECTED NOT DETECTED Final   Respiratory Syncytial Virus NOT DETECTED NOT DETECTED Final   Bordetella pertussis NOT DETECTED NOT DETECTED Final   Chlamydophila pneumoniae NOT DETECTED NOT DETECTED Final   Mycoplasma pneumoniae NOT DETECTED NOT DETECTED Final    Comment: Performed at [facility], [place] [address] [place], [place] [address]  MRSA PCR Screening     Status: None   Collection Time: 04/03/18  1:26 PM  Result Value Ref Range Status   MRSA by PCR NEGATIVE NEGATIVE Final    Comment:        The GeneXpert MRSA Assay (FDA approved for NASAL specimens only), is one component of a comprehensive MRSA colonization surveillance program. It is not intended to diagnose MRSA infection nor to guide or monitor treatment for MRSA infections. Performed at [facility], [place] [address] [place], [place] [address]   Culture, Urine     Status: None   Collection Time: 04/03/18  4:34 PM  Result Value Ref Range Status   Specimen Description URINE, CLEAN CATCH  Final   Special Requests NONE  Final   Culture   Final    NO GROWTH Performed at [facility], [place] [address] [place], [place] [address]    Report Status 04/04/2018 FINAL  Final  Culture, blood (routine x 2)     Status: None (Preliminary result)   Collection Time: 04/04/18  9:30 AM  Result Value Ref Range Status   Specimen Description BLOOD LEFT FOREARM  Final   Special Requests   Final    BOTTLES DRAWN AEROBIC AND ANAEROBIC Blood Culture adequate volume   Culture   Final    NO GROWTH 2 DAYS Performed at [facility], [place] [address], [place], [place]  [address]    Report Status PENDING  Incomplete  Culture, blood (routine x 2)     Status: None (Preliminary result)   Collection Time: 04/04/18 10:00 AM  Result Value Ref Range Status   Specimen Description BLOOD RIGHT ANTECUBITAL  Final   Special Requests   Final    BOTTLES DRAWN AEROBIC AND ANAEROBIC Blood Culture adequate volume   Culture   Final    NO GROWTH 2 DAYS Performed at [facility], [place] [address], [place],  [address]    Report Status PENDING  Incomplete    Radiology Studies: Dg Chest 2 View  Result Date: 04/04/2018 CLINICAL DATA:  Productive cough 1 week which shortness-of-breath chronically as well as left-sided chest pain 1 month. EXAM: CHEST - 2 VIEW COMPARISON:  04/02/2018, 03/19/2018 and chest CT 04/03/2018 FINDINGS: Lungs are adequately inflated demonstrate patchy bilateral mixed interstitial airspace density worse over the right upper lobe. Findings were seen on the recent CT scan suggesting infection. Likely layering posterior right pleural effusion. Mild stable cardiomegaly. Remainder of the exam is unchanged. IMPRESSION: Patchy bilateral mixed interstitial airspace process worse over the right upper lobe as seen on recent CT scan and likely due to infection. Probable layering  small right pleural effusion. Stable cardiomegaly. Electronically Signed   By: Marin Olp M.D.   On: 04/04/2018 09:10   Nm Pulmonary Perf And Vent  Result Date: 04/04/2018 CLINICAL DATA:  73 year old with hypoxemia or respiratory failure. EXAM: NUCLEAR MEDICINE VENTILATION - PERFUSION LUNG SCAN TECHNIQUE: Ventilation images were obtained in multiple projections using inhaled aerosol Tc-92m DTPA. Perfusion images were obtained in multiple projections after intravenous injection of Tc-37m MAA. RADIOPHARMACEUTICALS:  32.4 mCi of Tc-40m DTPA aerosol inhalation and 4.3 mCi Tc63m MAA IV COMPARISON:  Ventilation and perfusion examination 04/23/2017. Chest radiograph 04/04/2018 FINDINGS:  Ventilation: Patchy uptake in both lungs without a large wedge-shaped defect. Perfusion: No wedge shaped peripheral perfusion defects to suggest acute pulmonary embolism. IMPRESSION: Normal perfusion to both lungs. No evidence for a pulmonary embolism. Electronically Signed   By: Markus Daft M.D.   On: 04/04/2018 09:54    Scheduled Meds: . amoxicillin-clavulanate  500 mg Oral BID  . aspirin  325 mg Oral Daily  . cilostazol  100 mg Oral BID AC  . DULoxetine  60 mg Oral Daily  . enoxaparin (LOVENOX) injection  30 mg Subcutaneous Q24H  . fludrocortisone  0.1 mg Oral Once per day on Mon Tue Wed  . fluticasone furoate-vilanterol  1 puff Inhalation Daily  . furosemide  20 mg Oral Daily  . gabapentin  300 mg Oral BID  . guaiFENesin  600 mg Oral BID  . insulin aspart  0-9 Units Subcutaneous Q4H  . insulin aspart  3 Units Subcutaneous TID WC  . insulin glargine  10 Units Subcutaneous QHS  . ipratropium-albuterol  3 mL Nebulization QID  . levothyroxine  125 mcg Oral QAC breakfast  . metoprolol succinate  25 mg Oral Daily  . nicotine  14 mg Transdermal Daily  . nortriptyline  75 mg Oral QHS  . polyethylene glycol  17 g Oral Daily  . predniSONE  40 mg Oral Q breakfast  . QUEtiapine  100 mg Oral QHS  . senna-docusate  1 tablet Oral BID  . sodium chloride flush  3 mL Intravenous Q12H  . tamsulosin  0.4 mg Oral QPC supper  . Vitamin D (Ergocalciferol)  50,000 Units Oral Q Mon   Continuous Infusions: . sodium chloride      LOS: 4 days   Kerney Elbe, DO [name] PAGER is on AMION  If 7PM-7AM, please contact night-coverage [email or web address] Password [facility] 04/06/2018, 8:01 AM

## 2018-04-06 NOTE — Progress Notes (Signed)
Physical Therapy Treatment Patient Details Name: April Branch MRN: [id number] DOB: 17-Jan-1946 Today's Date: 04/06/2018    History of Present Illness April Branch is a 73yo F who comes to [facility] on 2/8 c hypoxia and dyspnea, Pt was recently admitted in Jan and DC with home O2. PMH: hypoTSH, HTN, CKD3, PVD, CAD, IDDM2.     PT Comments    Pt was seen for mobility and strength, and noted her sats were down with a longer hallway walk.  Pt elected to use no AD, and with PT using gait belt for safety could navigate the halls with care.  Pt might benefit from RW, but is not interested in using one.  Follow her for mobility and strength, and focus on progression of her gait with O2 for support of the effort.   Follow Up Recommendations  Home health PT     Equipment Recommendations  None recommended by PT    Recommendations for Other Services       Precautions / Restrictions Precautions Precautions: Fall Precaution Comments: monitor O2 sats Restrictions Weight Bearing Restrictions: No    Mobility  Bed Mobility Overal bed mobility: Independent                Transfers Overall transfer level: Modified independent Equipment used: None Transfers: Sit to/from Stand Sit to Stand: Modified independent (Device/Increase time)            Ambulation/Gait Ambulation/Gait assistance: Min guard(for safety due to recent loss) Gait Distance (Feet): 160 Feet Assistive device: None Gait Pattern/deviations: Step-through pattern;Decreased stride length;Wide base of support Gait velocity: reduced       Stairs             Wheelchair Mobility    Modified Rankin (Stroke Patients Only)       Balance     Sitting balance-Leahy Scale: Good       Standing balance-Leahy Scale: Fair                              Cognition Arousal/Alertness: Awake/alert Behavior During Therapy: WFL for tasks assessed/performed Overall Cognitive Status: Within  Functional Limits for tasks assessed                                        Exercises      General Comments General comments (skin integrity, edema, etc.): pt desaturated once during walk, used a brief standing rest to recover from 87% on 6L to 94%      Pertinent Vitals/Pain Pain Assessment: No/denies pain    Home Living                      Prior Function            PT Goals (current goals can now be found in the care plan section) Acute Rehab PT Goals Patient Stated Goal: Feel better Progress towards PT goals: Progressing toward goals    Frequency    Min 3X/week      PT Plan Current plan remains appropriate    Co-evaluation              AM-PAC PT "6 Clicks" Mobility   Outcome Measure  Help needed turning from your back to your side while in a flat bed without using bedrails?: None Help needed moving from lying on your  back to sitting on the side of a flat bed without using bedrails?: None Help needed moving to and from a bed to a chair (including a wheelchair)?: A Little Help needed standing up from a chair using your arms (e.g., wheelchair or bedside chair)?: A Little Help needed to walk in hospital room?: A Little Help needed climbing 3-5 steps with a railing? : A Lot 6 Click Score: 19    End of Session Equipment Utilized During Treatment: Oxygen Activity Tolerance: Patient tolerated treatment well;Treatment limited secondary to medical complications (Comment) Patient left: in bed;with call bell/phone within reach;with bed alarm set Nurse Communication: Mobility status PT Visit Diagnosis: Other abnormalities of gait and mobility (R26.89);Difficulty in walking, not elsewhere classified (R26.2);Other symptoms and signs involving the nervous system (M35.361)     Time: 4431-5400 PT Time Calculation (min) (ACUTE ONLY): 21 min  Charges:  $Gait Training: 8-22 mins                 Ramond Dial 04/06/2018, 5:27 PM  Mee Hives,  PT MS Acute Rehab Dept. Number: [place] [facility] [place]

## 2018-04-07 ENCOUNTER — Inpatient Hospital Stay (HOSPITAL_COMMUNITY): Payer: Medicare Other

## 2018-04-07 ENCOUNTER — Other Ambulatory Visit: Payer: Self-pay

## 2018-04-07 DIAGNOSIS — E039 Hypothyroidism, unspecified: Secondary | ICD-10-CM

## 2018-04-07 LAB — COMPREHENSIVE METABOLIC PANEL
ALT: 12 U/L (ref 0–44)
ANION GAP: 11 (ref 5–15)
AST: 10 U/L — ABNORMAL LOW (ref 15–41)
Albumin: 2.6 g/dL — ABNORMAL LOW (ref 3.5–5.0)
Alkaline Phosphatase: 74 U/L (ref 38–126)
BUN: 41 mg/dL — ABNORMAL HIGH (ref 8–23)
CO2: 22 mmol/L (ref 22–32)
Calcium: 8.7 mg/dL — ABNORMAL LOW (ref 8.9–10.3)
Chloride: 106 mmol/L (ref 98–111)
Creatinine, Ser: 1.53 mg/dL — ABNORMAL HIGH (ref 0.44–1.00)
GFR calc non Af Amer: 34 mL/min — ABNORMAL LOW (ref >60)
GFR, EST AFRICAN AMERICAN: 40 mL/min — AB (ref >60)
Glucose, Bld: 246 mg/dL — ABNORMAL HIGH (ref 70–99)
Potassium: 4.2 mmol/L (ref 3.5–5.1)
Sodium: 139 mmol/L (ref 135–145)
TOTAL PROTEIN: 5.6 g/dL — AB (ref 6.5–8.1)
Total Bilirubin: 0.3 mg/dL (ref 0.3–1.2)

## 2018-04-07 LAB — CBC WITH DIFFERENTIAL/PLATELET
Abs Immature Granulocytes: 0.19 10*3/uL — ABNORMAL HIGH (ref 0.00–0.07)
BASOS PCT: 0 %
Basophils Absolute: 0 10*3/uL (ref 0.0–0.1)
Eosinophils Absolute: 0 10*3/uL (ref 0.0–0.5)
Eosinophils Relative: 0 %
HCT: 24.1 % — ABNORMAL LOW (ref 36.0–46.0)
Hemoglobin: 7.7 g/dL — ABNORMAL LOW (ref 12.0–15.0)
Immature Granulocytes: 2 %
Lymphocytes Relative: 9 %
Lymphs Abs: 0.9 10*3/uL (ref 0.7–4.0)
MCH: 26.6 pg (ref 26.0–34.0)
MCHC: 32 g/dL (ref 30.0–36.0)
MCV: 83.1 fL (ref 80.0–100.0)
Monocytes Absolute: 0.5 10*3/uL (ref 0.1–1.0)
Monocytes Relative: 6 %
Neutro Abs: 7.8 10*3/uL — ABNORMAL HIGH (ref 1.7–7.7)
Neutrophils Relative %: 83 %
Platelets: 264 10*3/uL (ref 150–400)
RBC: 2.9 MIL/uL — ABNORMAL LOW (ref 3.87–5.11)
RDW: 15.9 % — ABNORMAL HIGH (ref 11.5–15.5)
WBC: 9.4 10*3/uL (ref 4.0–10.5)
nRBC: 0 % (ref 0.0–0.2)

## 2018-04-07 LAB — GLUCOSE, CAPILLARY
GLUCOSE-CAPILLARY: 234 mg/dL — AB (ref 70–99)
Glucose-Capillary: 172 mg/dL — ABNORMAL HIGH (ref 70–99)
Glucose-Capillary: 237 mg/dL — ABNORMAL HIGH (ref 70–99)
Glucose-Capillary: 380 mg/dL — ABNORMAL HIGH (ref 70–99)

## 2018-04-07 LAB — PHOSPHORUS: Phosphorus: 4 mg/dL (ref 2.5–4.6)

## 2018-04-07 LAB — MAGNESIUM: Magnesium: 2 mg/dL (ref 1.7–2.4)

## 2018-04-07 MED ORDER — IPRATROPIUM-ALBUTEROL 0.5-2.5 (3) MG/3ML IN SOLN: 3.0000 mL | RESPIRATORY_TRACT | 0 refills | Status: DC | PRN

## 2018-04-07 MED ORDER — ALBUTEROL SULFATE HFA 108 (90 BASE) MCG/ACT IN AERS: 1.0000 | INHALATION_SPRAY | Freq: Four times a day (QID) | RESPIRATORY_TRACT | 0 refills | Status: DC | PRN

## 2018-04-07 MED ORDER — FLUTICASONE FUROATE-VILANTEROL 100-25 MCG/INH IN AEPB: 1.0000 | INHALATION_SPRAY | Freq: Every day | RESPIRATORY_TRACT | 0 refills | Status: DC

## 2018-04-07 MED ORDER — TAMSULOSIN HCL 0.4 MG PO CAPS: 0.4000 mg | ORAL_CAPSULE | Freq: Every day | ORAL | 0 refills | Status: DC

## 2018-04-07 MED ORDER — LEVOTHYROXINE SODIUM 125 MCG PO TABS: 125.0000 ug | ORAL_TABLET | Freq: Every day | ORAL | 0 refills | Status: DC

## 2018-04-07 MED ORDER — GUAIFENESIN ER 600 MG PO TB12: 600.0000 mg | ORAL_TABLET | Freq: Two times a day (BID) | ORAL | 0 refills | Status: DC

## 2018-04-07 MED ORDER — PREDNISONE 10 MG PO TABS: ORAL_TABLET | ORAL | 0 refills | Status: DC

## 2018-04-07 MED ORDER — ACETAMINOPHEN 325 MG PO TABS: 650.0000 mg | ORAL_TABLET | Freq: Four times a day (QID) | ORAL | 0 refills | Status: DC | PRN

## 2018-04-07 MED ORDER — SENNOSIDES-DOCUSATE SODIUM 8.6-50 MG PO TABS: 1.0000 | ORAL_TABLET | Freq: Two times a day (BID) | ORAL | 0 refills | Status: DC

## 2018-04-07 MED ORDER — AMOXICILLIN-POT CLAVULANATE 500-125 MG PO TABS: 500.0000 mg | ORAL_TABLET | Freq: Two times a day (BID) | ORAL | 0 refills | Status: AC

## 2018-04-07 NOTE — Progress Notes (Signed)
SATURATION QUALIFICATIONS: (This note is used to comply with regulatory documentation for home oxygen)  Patient Saturations on Room Air at Rest = 97%  Patient Saturations on Room Air while Ambulating = 88%  Patient Saturations on 1 Liters of oxygen while Ambulating = 97%  Please briefly explain why patient needs home oxygen: to maintain oxygen saturations > 90% when ambulating.  Ellamae Sia, PT, DPT Acute Rehabilitation Services Pager [phone] Office [phone]

## 2018-04-07 NOTE — Progress Notes (Signed)
Discharge AVS meds take and those due reviewed with pt. Follow up appointments and when to call MD reviewed. All questions and concerns addressed. No further questions at this time. D/c IV and TELE, CCMD notified. D/C home per orders. Pt brought down via wheelchair with staff and family.

## 2018-04-07 NOTE — Discharge Summary (Signed)
Physician Discharge Summary  April Branch OEU:[id number] DOB: 03/24/1945 DOA: 04/02/2018  PCP: [name], MD  Admit date: 04/02/2018 Discharge date: 04/07/2018  Admitted From: Home Disposition:  Home with Home Health PT/OT/RN/Aide  Recommendations for Outpatient Follow-up:  1. Follow up with PCP in 1-2 weeks 2. Follow up with Pulmonary Dr. Elsworth Soho on 04/18/2018 3. Repeat CXR in 3-6 weeks  4. Please obtain CMP/CBC, Mag, Phos in one week 5. Please follow up on the following pending results:  Home Health: Yes  Equipment/Devices: Nebulizer and 3in1   Discharge Condition: Stable and Improved  CODE STATUS: FULL CODE  Diet recommendation: Heart Healthy Carb Modified   Brief/Interim Summary: HPI per Dr. Toy Baker on 04/02/2018 April Perches Williamsis a 73 y.o.femalewith medical history significant of COPDanemia, anxiety, nonobstructive CAD, colon polyps, COPD, depression, fibromyalgia, GERD, headache, history of TIA, HPV, HLD, HTN, type 2 diabetes on insulin pump, peripheral neuropathy, peripheral vascular disease, history of renal cancer, history of vertigo   Presented withshe was walking in her home and got more short of breath no lightheadedness or chest pain associated no palpitations nonproductive cough she thought it was panic attack she felt anxious.  Recently admitted left-sided weakness and numbness was having CHF exacerbation requiring admission to ICU on BiPAP MRI showed no CVA felt to be more of a TIA. Echogram done showing EF 60-65% The time of discharge her oxygen requirement was down to 1 L which was new for her and she was discharged home with plan to follow-up. Etiology for acute respiratory failure felt to be multifactorial including pulmonary edema and COPD exacerbation or underlying interstitial lung. Responded well to IV Solu-Medrol was treated with Brovana Pulmicort nebulizers completed 5 days of doxycycline disease unfortunately patient did not  follow-up after discharge. Patient was encouraged to stop smoking  During last admission thought to maybe have adrenal insufficiency she was normotensive but the low a.m. cortisol she was on Florinef and plan was to have follow-up with endocrinology. Had hematuria during last admission with plan to have this repeated and see if persistent then needs to follow-up  **Seen by Pulmonary and they feel her symptoms are secondary to a Multilobar PNA and are recommending weaning O2 and D/C'ing on a slow Prednisone Taper. She improved and was weaned back to Home O2 of 1 Liter and did Ambulatory Walk Screen and O2 dropped on RA but improved on 1 Liter. She was medically stable to be discharged her oxygen requirements improved and she was feeling better.  She will continue antibiotic treatment for total 10-day course and will need to follow-up with pulmonary in outpatient setting in 2 weeks and has an appointment scheduled with Dr. Elsworth Soho on 04/18/2018.  Discharge Diagnoses:  Active Problems:   Hypothyroidism   Insulin dependent type 2 diabetes mellitus, controlled (HCC)   DISORDER, TOBACCO USE   Essential hypertension   CAD (coronary artery disease)   Hyperlipidemia   Chronic pain syndrome   Acute kidney injury superimposed on CKD (HCC)   Respiratory failure with hypoxia (HCC)   Acute on chronic respiratory failure with hypoxia (HCC)   Acute on chronic diastolic CHF (congestive heart failure) ([place])   HCAP (healthcare-associated pneumonia)   Insulin dependent diabetes mellitus ([place])   Lobar pneumonia ([place])   Acute urinary retention  Acute on chronic hypoxic respiratory failure in the setting of Multilobar PNA, improved significantly -She was hospitalized for the same;Required bipap in the hospital, she subsequently improved andwas sent home on home O2 and Lasix.She missed  her follow up appointment with pulm on 2/6 -Persistent bilateral pulmonary infiltrate, no fever, does has leukocytosis  (Trending down and went from 18.1 -> 13.4 -> 10.7 -> 9.4), She is aaox3, does not appear septic, troponin negative, abg with normal ph, no co2 retention.  -MRSA screening negative, urine strep pneumo antigen negative,  -Respiratory Viral Panel Negative, sputum culture pending collection, -Respiratory Virus Pane went from <0.10 -> 0.11 -> 0.10 -ANA negative; ANCA <1:20; RA Latex Turbid <10.0 -High resolution CT scan concerning for bilateral bronchopneumonia, low probability VQ scan  -Continue Nebs with Breo Ellipta and DuoNeb TID -Steroids Weaned and now on Prednisone 40 mg daily with  Pulmonary recommending a slow taper by 10 mg q5 days until follow up in Pulmonary Clinic in 2 weeks. Pulmonary also recommending keeping on Lasix 20 mg daily to aim for a weight around 72 kg -D/C'd IV Vancomycin and IV Cefepime as she has no fever, but does has leukocytosis (trending down), Blood culture /urine cultureno growth; Changed to po Augmentin and will continue for another 5 days to have a 10-day course total. -Pulmonology consulted,pneumonia vs ILD but they feel that the High Resolution CT seems to represent Multilobar PNA rather than ILD -Smoking Cessastion Counseling given  -Pulmonary recommend discharge on slow steroids taper once patient is able to wean to nasal cannula, detail please refer note from Dr. Elsworth Soho team on 2/10 -Improved significantly and was weaned back to her oxygen baseline requirements at home and had a repeat ambulatory walk screen and saturated 97% at rest on room air and then desaturated down to 88% and then placed on 1 L and she is back up to 97% -Will need an outpatient Sleep Study   AKI on CKD Stage IIIwith mild Metabolic Acidosis, improving -Baseline Cr is around 1.5 -Cr on presentation was 2.6; BUN Cr has now improved to 41/1.53; -CO2 is now 22 -U/A with rare bacteria, urine culture showed No Growth, -She hadurinary retention, required in and out cathx1 with 1000cc  removed -C/w bladder scan qshift and continue with Tamsulosin 0.4 mg Daily after supper -Continue to Inncrease activity , avoid constipation, D/C'd Toviazbut will resume at discharge; will continue tamsulosin at discharge -Continue to Monitor and Trend Renal Fxn  H/o Diastolic CHF -She reportedweight gain, base weight is 156, she weight 168 onadmission, -Hard to assess volume due to her obesity S-he does not have leg edema, but has bilateral lung infiltrate andreportweight gain -She is recently started on lasix from last discharge, lasix is continued at home dose at 20 mg po Daily -Strict I's/Os and Daily Weights; Her weight went from 168 -> 172 -> 167  and she is -1.054 Liters since Admission -Continue to Monitor for Volume Overload  -Continue Lasix and follow-up with PCP and cardiology in outpatient setting  HTN -BP currently stable and is 126/63 -Continue Metoprolol Succinate 25 mg po Daily   Hypomagnesemia -Improved. Mag Level was 2.0 -Continue to Monitor and Replete as Necessary  -Repeat Mag Level in AM   H/o COPD -Recently started on Home O2 and wears 1 Liter Chronically,  -Currently no wheezing and respiratory status is improved and she is weaned down to her home oxygen requirements -She reports quitting smoking several weeks ago  Tobacco Abuse/Smoker -Admits to quitting smoking several weeks ago -Smoking Cessation Counseling given -C/w Nicotine 14 mg TD Patch    H/o CVA/TIA with mild left hemiparesis/memory impairment -On ASA 325 mg, betablocker , not on statin, (LDL 64) -she reports difficulty  remembering new info since after the stroke  Hx of PAD -C/w Cilostazol 100 mg po BID and ASA 325 mg po Daily   H/o renal cancer? (2010); ?Adrenal mass with Suspected Adrenal Insufficieny , unknown details -Per Documentation had Laparoscopy biopsy and cryoablation in 7/08 by Dr. Diona Fanti for her Renal Issues -On chronic Fludricortisone 0.1 mg po MTW for  Orthostasis unclear detail, she is followed with Endocrinology regularly -Her next appoint with Endocrinology Dr [name] was on 2/12 and she will need to reschedule -Follow-up with PCP in outpatient setting carefully  Leukocytosis -In the Setting of Steroid Demargination -Improving; WBC went from 13.4 -> 10.7 -> 9.4 -Continue to [place] for S/Sx of Infection; Currently on Abx with Augmentin 500-125 mg po Daily will continue for a total 10-day treatment -Repeat CBC in AM   Hypothyrodism -Continue Levothyroxine 125 mcg po Daily   Anxiety/Depression/Insomnia  -Reports has been on ativan for 20 yrs in the past, she was taken off ativan, -Currently on Nortriptyline 75 mg qHS, Duloxetine 60 mg po Daily, Quetiapine 100 mg po qHS, Hydroxyzine 25 mg po q6hprn  Chronic Pain Syndrome -C/w Duloxetine 60 mg po qHS, Gabapenting 300 mg po BID and Hydrocodone-Acetaminophen 10-325 mg po q4hprn  Insulin Dependent Diabetes Mellitus Type 2  -HbA1c of 7.9 on 04/03/2018 -CBG's ranging from 172-380 and patient is in the setting of steroid usage -On insulin pump at home; pump is turned off since admission and will need to Resume at D/C  -Started on Lantus 10 units qhs but increased to 14 units, Sensitive Novolog SSI q4h and Meal Coverage with 3 Units TID -Blood glucose likely will be elevated while on IV steroids but changed to po Prednisone 40 mg po Daily and blood sugar still remains elevated -Diabetes Education Coordinator Consulted and recommended increasing Lantus to 18 units units qHS but patient will be discharged and will have PCP further adjust given her insulin pump -Continue to adjust insulin accordingly  necessary in outpatient setting  Obesity -Estimated body mass index is 31.74 kg/m as calculated from the following:   Height as of this encounter: 5\' 1"  (1.549 m).   Weight as of this encounter: 76.2 kg. -Weight Loss Counseling Given   Normocytic Anemia -Hemoglobin/hematocrit went from  8.1/25.2 and is now 7.7/24.1 -Relatively stable  -Check anemia panel in the outpatient setting -Continue monitor for signs of bleeding -Repeat CBC in the outpatient setting with PCP and pulmonary  Discharge Instructions  Discharge Instructions    (HEART FAILURE PATIENTS) Call MD:  Anytime you have any of the following symptoms: 1) 3 pound weight gain in 24 hours or 5 pounds in 1 week 2) shortness of breath, with or without a dry hacking cough 3) swelling in the hands, feet or stomach 4) if you have to sleep on extra pillows at night in order to breathe.   Complete by:  As directed    Call MD for:  difficulty breathing, headache or visual disturbances   Complete by:  As directed    Call MD for:  extreme fatigue   Complete by:  As directed    Call MD for:  hives   Complete by:  As directed    Call MD for:  persistant dizziness or light-headedness   Complete by:  As directed    Call MD for:  persistant nausea and vomiting   Complete by:  As directed    Call MD for:  redness, tenderness, or signs of infection (pain, swelling, redness, odor or  green/yellow discharge around incision site)   Complete by:  As directed    Call MD for:  severe uncontrolled pain   Complete by:  As directed    Call MD for:  temperature >100.4   Complete by:  As directed    DME Nebulizer/meds   Complete by:  As directed    Patient needs a nebulizer to treat with the following condition:  HCAP (healthcare-associated pneumonia)   Diet - low sodium heart healthy   Complete by:  As directed    Diet Carb Modified   Complete by:  As directed    Discharge instructions   Complete by:  As directed    You were cared for by a hospitalist during your hospital stay. If you have any questions about your discharge medications or the care you received while you were in the hospital after you are discharged, you can call the unit and ask to speak with the hospitalist on call if the hospitalist that took care of you is not  available. Once you are discharged, your primary care physician will handle any further medical issues. Please note that NO REFILLS for any discharge medications will be authorized once you are discharged, as it is imperative that you return to your primary care physician (or establish a relationship with a primary care physician if you do not have one) for your aftercare needs so that they can reassess your need for medications and monitor your lab values.  Follow up with PCP and Pulmonary. Take all medications as prescribed. If symptoms change or worsen please return to the ED for evaluation   Increase activity slowly   Complete by:  As directed      Allergies as of 04/07/2018   No Known Allergies     Medication List    STOP taking these medications   BACTRIM DS 800-160 MG tablet Generic drug:  sulfamethoxazole-trimethoprim     TAKE these medications   ACCU-CHEK FASTCLIX LANCETS Misc Use 4 per day to check blood sugar dx code E11.9   acetaminophen 325 MG tablet Commonly known as:  TYLENOL Take 2 tablets (650 mg total) by mouth every 6 (six) hours as needed for mild pain (or Fever >/= 101).   albuterol 108 (90 Base) MCG/ACT inhaler Commonly known as:  PROVENTIL HFA;VENTOLIN HFA Inhale 1-2 puffs into the lungs every 6 (six) hours as needed for wheezing or shortness of breath.   amoxicillin-clavulanate 500-125 MG tablet Commonly known as:  AUGMENTIN Take 1 tablet (500 mg total) by mouth 2 (two) times daily for 5 days.   aspirin 325 MG EC tablet Take 1 tablet (325 mg total) by mouth daily.   cilostazol 100 MG tablet Commonly known as:  PLETAL TAKE 1 TABLET BY MOUTH TWICE A DAY BEFORE MEALS What changed:    how much to take  how to take this  when to take this  additional instructions   DULoxetine 60 MG capsule Commonly known as:  CYMBALTA Take 1 capsule (60 mg total) by mouth daily.   ferrous sulfate 325 (65 FE) MG tablet Take 325 mg by mouth daily with  breakfast.   fludrocortisone 0.1 MG tablet Commonly known as:  FLORINEF Take 1 tablet (0.1 mg total) by mouth 3 (three) times a week. What changed:    when to take this  additional instructions   fluticasone furoate-vilanterol 100-25 MCG/INH Aepb Commonly known as:  BREO ELLIPTA Inhale 1 puff into the lungs daily. Start taking on:  April 08, 2018   furosemide 20 MG tablet Commonly known as:  LASIX Take 1 tablet (20 mg total) by mouth daily.   gabapentin 300 MG capsule Commonly known as:  NEURONTIN Take 1 capsule (300 mg total) by mouth 2 (two) times daily.   glucose blood test strip Commonly known as:  CONTOUR NEXT TEST Use to test blood sugar 3 times daily E11.9   guaiFENesin 600 MG 12 hr tablet Commonly known as:  MUCINEX Take 1 tablet (600 mg total) by mouth 2 (two) times daily.   HYDROcodone-acetaminophen 10-325 MG tablet Commonly known as:  NORCO Take 1 tablet by mouth 3 (three) times daily as needed (for pain). Follow up with PCP if refills needed   hydrOXYzine 25 MG tablet Commonly known as:  ATARAX/VISTARIL Take 1 tablet (25 mg total) by mouth every 6 (six) hours. What changed:  when to take this   insulin lispro 100 UNIT/ML injection Commonly known as:  HUMALOG Use 70 units daily in insulin pump What changed:    how much to take  how to take this  when to take this   Insulin Pen Needle 32G X 4 MM Misc Commonly known as:  BD PEN NEEDLE NANO U/F Use to inject insulin   ipratropium-albuterol 0.5-2.5 (3) MG/3ML Soln Commonly known as:  DUONEB Take 3 mLs by nebulization every 4 (four) hours as needed.   lansoprazole 30 MG capsule Commonly known as:  PREVACID Take 1 capsule (30 mg total) by mouth daily.   levothyroxine 125 MCG tablet Commonly known as:  SYNTHROID, LEVOTHROID Take 1 tablet (125 mcg total) by mouth daily before breakfast. Start taking on:  April 08, 2018 What changed:    medication strength  how much to take  when to  take this   metoprolol succinate 25 MG 24 hr tablet Commonly known as:  TOPROL-XL TAKE 1 TABLET BY MOUTH EVERY DAY   nicotine 14 mg/24hr patch Commonly known as:  NICODERM CQ - dosed in mg/24 hours Place 1 patch (14 mg total) onto the skin daily.   nortriptyline 75 MG capsule Commonly known as:  PAMELOR Take 75 mg by mouth at bedtime.   OXYGEN Inhale 1 L into the lungs as needed (shortness of breath).   polyethylene glycol packet Commonly known as:  MIRALAX / GLYCOLAX Take 17 g by mouth daily. What changed:    when to take this  reasons to take this   potassium chloride 10 MEQ tablet Commonly known as:  KLOR-CON 10 Take 1 tablet (10 mEq total) by mouth 2 (two) times daily.   predniSONE 10 MG tablet Commonly known as:  DELTASONE Take 4 tablets (40 mg total) by mouth daily for 5 days, THEN 3 tablets (30 mg total) daily for 5 days, THEN 2 tablets (20 mg total) daily for 5 days, THEN 1 tablet (10 mg total) daily for 5 days. Start taking on:  April 07, 2018 What changed:  See the new instructions.   QUEtiapine 100 MG tablet Commonly known as:  SEROQUEL Take 100 mg by mouth at bedtime.   senna 8.6 MG Tabs tablet Commonly known as:  SENOKOT Take 2 tablets (17.2 mg total) by mouth daily. What changed:  how much to take   senna-docusate 8.6-50 MG tablet Commonly known as:  Senokot-S Take 1 tablet by mouth 2 (two) times daily.   tamsulosin 0.4 MG Caps capsule Commonly known as:  FLOMAX Take 1 capsule (0.4 mg total) by mouth daily after supper.  tolterodine 4 MG 24 hr capsule Commonly known as:  DETROL LA Take 4 mg by mouth daily.   triamcinolone cream 0.1 % Commonly known as:  KENALOG Apply 1 application topically 2 (two) times daily.   Vitamin D (Ergocalciferol) 1.25 MG (50000 UT) Caps capsule Commonly known as:  DRISDOL Take 50,000 Units by mouth every Monday.            Durable Medical Equipment  (From admission, onward)         Start      Ordered   04/07/18 1519  DME 3-in-1  Once     04/07/18 1531   04/07/18 0000  DME Nebulizer/meds    Question:  Patient needs a nebulizer to treat with the following condition  Answer:  HCAP (healthcare-associated pneumonia)   04/07/18 1531         Follow-up Information    Rigoberto Noel, MD Follow up on 04/18/2018.   Specialty:  Pulmonary Disease Why:  Appt at 3:30.  Please arrive at 3:15 for check in.  Contact information: [address] [place] [address] [phone]        [facility]-Home Follow up.   Specialty:  [place] Why:  HHRN/PT/OT arranged- they will call you to set up home visits Contact information: [address] [place] [address] [phone]        [facility] - Dme Follow up.   Why:  neb. machine- to be delivered to room prior to discharge-  (home 02 - will need to call when you get home for customer support to get home unit set up- # to call [phone]) Contact information: [address] [place] [address] [phone]          No Known Allergies  Consultations:  Pulmonary/PCCM  Procedures/Studies: Dg Chest 1 View  Result Date: 03/16/2018 CLINICAL DATA:  Dyspnea with agitation EXAM: CHEST  1 VIEW COMPARISON:  03/16/2018 at 0756 hours FINDINGS: Stable cardiomegaly. Nonaneurysmal atherosclerosis of the aorta. Interval increase in interstitial edema superimposed on coarsened interstitial markings. No pneumothorax or significant pleural effusions given the frontal projection. No acute osseous abnormality. IMPRESSION: Interval increase in interstitial edema superimposed on coarsened interstitial markings. Stable cardiomegaly with aortic atherosclerosis. Electronically Signed   By: Ashley Royalty M.D.   On: 03/16/2018 18:09   Dg Chest 2 View  Result Date: 04/04/2018 CLINICAL DATA:  Productive cough 1 week which shortness-of-breath chronically as well as left-sided chest pain 1  month. EXAM: CHEST - 2 VIEW COMPARISON:  04/02/2018, 03/19/2018 and chest CT 04/03/2018 FINDINGS: Lungs are adequately inflated demonstrate patchy bilateral mixed interstitial airspace density worse over the right upper lobe. Findings were seen on the recent CT scan suggesting infection. Likely layering posterior right pleural effusion. Mild stable cardiomegaly. Remainder of the exam is unchanged. IMPRESSION: Patchy bilateral mixed interstitial airspace process worse over the right upper lobe as seen on recent CT scan and likely due to infection. Probable layering small right pleural effusion. Stable cardiomegaly. Electronically Signed   By: Marin Olp M.D.   On: 04/04/2018 09:10   Dg Abdomen 1 View  Result Date: 03/16/2018 CLINICAL DATA:  Poor historian.  Evaluate for metallic foreign body. EXAM: ABDOMEN - 1 VIEW COMPARISON:  None. FINDINGS: No definitive ingested radiopaque foreign body though there is exclusion of the lateral-most aspect the right mid hemiabdomen. Moderate to large colonic stool burden without evidence of enteric obstruction. Nondiagnostic evaluation for pneumoperitoneum secondary  to supine positioning and exclusion of the lower thorax. No pneumatosis or portal venous gas. Post cholecystectomy. Punctate phleboliths overlies the right hemipelvis. Degenerative change the lower thoracic and lumbar spine. Atherosclerotic plaque overlies the abdominal aorta and pelvic vasculature. IMPRESSION: 1. No ingested radiopaque foreign body. 2. Moderate to large colonic stool burden without evidence of enteric obstruction. Electronically Signed   By: Sandi Mariscal M.D.   On: 03/16/2018 10:21   Ct Head Wo Contrast  Result Date: 03/16/2018 CLINICAL DATA:  Possible stroke. Ataxia with left-sided arm and leg weakness. EXAM: CT HEAD WITHOUT CONTRAST TECHNIQUE: Contiguous axial images were obtained from the base of the skull through the vertex without intravenous contrast. COMPARISON:  04/22/2017 FINDINGS:  Brain: No evidence of acute infarction, hemorrhage, hydrocephalus, extra-axial collection or mass lesion/mass effect. Mild chronic small vessel ischemic type change in the cerebral white matter. Vascular: Atherosclerotic calcification.  No hyperdense vessel Skull: No acute finding Sinuses/Orbits: Endoscopic sinus surgery on the right. There is volume loss at the right ethmoids with relatively expanded right orbit. Bilateral cataract resection. IMPRESSION: No acute finding. Electronically Signed   By: Monte Fantasia M.D.   On: 03/16/2018 07:16   Mr Brain Wo Contrast  Result Date: 03/16/2018 CLINICAL DATA:  73 year old female with ataxia, left extremity weakness. Possible stroke. EXAM: MRI HEAD WITHOUT CONTRAST TECHNIQUE: Multiplanar, multiecho pulse sequences of the brain and surrounding structures were obtained without intravenous contrast. COMPARISON:  Head CT without contrast 0649 hours today. Brain MRI 04/22/2017 and earlier. FINDINGS: The examination had to be discontinued prior to completion by patient request. Diffusion and motion degraded sagittal T1 weighted imaging only was obtained. No restricted diffusion or evidence of acute infarction. No midline shift, mass effect, or evidence of intracranial mass lesion. No ventriculomegaly. Patchy and confluent facilitated diffusion in the cerebral white matter corresponds to T2 and FLAIR hyperintensity in 2019. Sagittal T1 weighted imaging appears grossly stable and negative. IMPRESSION: 1. Negative for acute infarct. 2. The examination had to be discontinued prior to completion by patient request. Diffusion and motion degraded sagittal T1 weighted imaging only was obtained. Electronically Signed   By: Genevie Ann M.D.   On: 03/16/2018 14:08   Ct Chest High Resolution  Result Date: 04/03/2018 CLINICAL DATA:  73 year old female with history of interstitial lung disease. EXAM: CT CHEST WITHOUT CONTRAST TECHNIQUE: Multidetector CT imaging of the chest was performed  following the standard protocol without intravenous contrast. High resolution imaging of the lungs, as well as inspiratory and expiratory imaging, was performed. COMPARISON:  Chest CT 05/09/2011. FINDINGS: Cardiovascular: Heart size is mildly enlarged. There is no significant pericardial fluid, thickening or pericardial calcification. There is aortic atherosclerosis, as well as atherosclerosis of the great vessels of the mediastinum and the coronary arteries, including calcified atherosclerotic plaque in the left main, left anterior descending, left circumflex and right coronary arteries. Mediastinum/Nodes: No pathologically enlarged mediastinal or hilar lymph nodes. Please note that accurate exclusion of hilar adenopathy is limited on noncontrast CT scans. Esophagus is unremarkable in appearance. No axillary lymphadenopathy. Lungs/Pleura: Trace left and small right pleural effusions. Patchy multifocal ground-glass attenuation and septal thickening is noted throughout all aspects of the lungs bilaterally, most evident in a peribronchovascular distribution. No definite craniocaudal gradient. No subpleural reticulation. No significant regions of traction bronchiectasis. No honeycombing. Inspiratory and expiratory imaging demonstrates some mild air trapping, indicative of mild small airways disease. Upper Abdomen: Aortic atherosclerosis. Musculoskeletal: There are no aggressive appearing lytic or blastic lesions noted in the visualized portions  of the skeleton. IMPRESSION: 1. The appearance the chest is favored to reflect a multilobar bronchopneumonia, rather than underlying interstitial lung disease. This is particularly evident when reviewing prior chest radiographs. If there is persistent clinical concern for interstitial lung disease, repeat high-resolution chest CT could be obtained in 6-12 months to assess for temporal changes in the appearance of the lung parenchyma. 2. Small right and trace left pleural  effusions lying dependently. 3. Mild air trapping indicative of mild small airways disease. 4. Aortic atherosclerosis, in addition to left main and 3 vessel coronary artery disease. Assessment for potential risk factor modification, dietary therapy or pharmacologic therapy may be warranted, if clinically indicated. Aortic Atherosclerosis (ICD10-I70.0). Electronically Signed   By: Vinnie Langton M.D.   On: 04/03/2018 16:37   Nm Pulmonary Perf And Vent  Result Date: 04/04/2018 CLINICAL DATA:  73 year old with hypoxemia or respiratory failure. EXAM: NUCLEAR MEDICINE VENTILATION - PERFUSION LUNG SCAN TECHNIQUE: Ventilation images were obtained in multiple projections using inhaled aerosol Tc-58m DTPA. Perfusion images were obtained in multiple projections after intravenous injection of Tc-26m MAA. RADIOPHARMACEUTICALS:  32.4 mCi of Tc-63m DTPA aerosol inhalation and 4.3 mCi Tc13m MAA IV COMPARISON:  Ventilation and perfusion examination 04/23/2017. Chest radiograph 04/04/2018 FINDINGS: Ventilation: Patchy uptake in both lungs without a large wedge-shaped defect. Perfusion: No wedge shaped peripheral perfusion defects to suggest acute pulmonary embolism. IMPRESSION: Normal perfusion to both lungs. No evidence for a pulmonary embolism. Electronically Signed   By: Markus Daft M.D.   On: 04/04/2018 09:54   Dg Chest Port 1 View  Result Date: 04/07/2018 CLINICAL DATA:  SOB (shortness of breath) ,HX HTN,COPD EXAM: PORTABLE CHEST 1 VIEW COMPARISON:  04/04/2018 and older exams. FINDINGS: Cardiac silhouette is mildly enlarged. No mediastinal or hilar masses. Interstitial and hazy airspace opacities noted previously are similar on the right, mildly improved in the left upper lobe. There are no new lung abnormalities. IMPRESSION: 1. Mild improvement in lung aeration from the prior study, with improved interstitial and hazy airspace opacities in the left upper lung. No new abnormalities. Electronically Signed   By: Lajean Manes M.D.   On: 04/07/2018 09:55   Dg Chest Portable 1 View  Result Date: 04/02/2018 CLINICAL DATA:  Dyspnea, history hypertension, diabetes mellitus, COPD, coronary artery disease EXAM: PORTABLE CHEST 1 VIEW COMPARISON:  Portable exam 1738 hours compared to 03/19/2018 FINDINGS: Enlargement of cardiac silhouette. Atherosclerotic calcification aorta. Mediastinal contours and pulmonary vascularity normal. Scattered interstitial infiltrates bilaterally increased in RIGHT upper lobe since previous exam. Improved infiltrates in LEFT upper lobe. This could represent waxing and waning edema or infection. Subsegmental atelectasis at lingula. No pleural effusion or pneumothorax. Bones demineralized. IMPRESSION: Scattered interstitial infiltrates, increased in RIGHT upper lobe and improved in LEFT upper lobe since previous exam question pulmonary edema versus infection. Enlargement of cardiac silhouette with subsegmental atelectasis in lingula. Electronically Signed   By: Lavonia Dana M.D.   On: 04/02/2018 18:15   Dg Chest Port 1 View  Result Date: 03/19/2018 CLINICAL DATA:  Hypoxia. Congestive heart failure. COPD. EXAM: PORTABLE CHEST 1 VIEW COMPARISON:  03/17/2018 FINDINGS: Stable mild cardiomegaly. Diffuse mixed interstitial and airspace disease shows no significant change. Small right pleural effusion also stable. IMPRESSION: No significant change compared with prior exam. Electronically Signed   By: Earle Gell M.D.   On: 03/19/2018 16:07   Dg Chest Port 1 View  Result Date: 03/17/2018 CLINICAL DATA:  73 year old female with a history of acute congestive heart failure EXAM: PORTABLE CHEST  1 VIEW COMPARISON:  03/16/2018, 03/16/2018, 12/10/2017 FINDINGS: Cardiomediastinal silhouette unchanged with cardiomegaly. Similar appearance of mixed interstitial and airspace opacities of the bilateral lungs with improved aeration on the right. Superimposed interlobular septal thickening. No pneumothorax. Blunting of the  right costophrenic angle. IMPRESSION: Improved aeration of the right lung, with persisting bilateral mixed interstitial and airspace opacity. Small right pleural effusion. Electronically Signed   By: Corrie Mckusick D.O.   On: 03/17/2018 09:24   Dg Chest Portable 1 View  Result Date: 03/16/2018 CLINICAL DATA:  Shortness of breath and hypoxia EXAM: PORTABLE CHEST 1 VIEW COMPARISON:  12/10/2017 FINDINGS: Cardiopericardial enlargement, increased from prior. Diffuse interstitial coarsening that could be congestive or bronchitic. There is a linear density in the right suprahilar lung that is scarring when compared to priors. No effusion or pneumothorax. IMPRESSION: Increased cardiopericardial size with generalized congestive or bronchitic interstitial coarsening. Electronically Signed   By: Monte Fantasia M.D.   On: 03/16/2018 08:14   Vas US Carotid (at [place] Only)  Result Date: 03/18/2018 Carotid Arterial Duplex Study Indications: TIA. Performing Technologist: Abram Sander RVS  Examination Guidelines: A complete evaluation includes B-mode imaging, spectral Doppler, color Doppler, and power Doppler as needed of all accessible portions of each vessel. Bilateral testing is considered an integral part of a complete examination. Limited examinations for reoccurring indications may be performed as noted.  Right Carotid Findings: +----------+--------+--------+--------+------------+--------+           PSV cm/sEDV cm/sStenosisDescribe    Comments +----------+--------+--------+--------+------------+--------+ CCA Prox  114                     heterogenous         +----------+--------+--------+--------+------------+--------+ CCA Distal143     13              heterogenous         +----------+--------+--------+--------+------------+--------+ ICA Prox  164     40      40-59%  heterogenous         +----------+--------+--------+--------+------------+--------+ ICA Distal130     25                                    +----------+--------+--------+--------+------------+--------+ ECA       160                                          +----------+--------+--------+--------+------------+--------+ +----------+--------+-------+--------+-------------------+           PSV cm/sEDV cmsDescribeArm Pressure (mmHG) +----------+--------+-------+--------+-------------------+ Subclavian122                                        +----------+--------+-------+--------+-------------------+ +---------+--------+--+--------+--+---------+ VertebralPSV cm/s50EDV cm/s15Antegrade +---------+--------+--+--------+--+---------+  Left Carotid Findings: +----------+--------+--------+--------+------------+--------+           PSV cm/sEDV cm/sStenosisDescribe    Comments +----------+--------+--------+--------+------------+--------+ CCA Prox  108     14              heterogenous         +----------+--------+--------+--------+------------+--------+ CCA [id number]     14              heterogenous         +----------+--------+--------+--------+------------+--------+ ICA Prox  220     49  40-59%  heterogenous         +----------+--------+--------+--------+------------+--------+ ICA Distal101     20                                   +----------+--------+--------+--------+------------+--------+ ECA       195     6                                    +----------+--------+--------+--------+------------+--------+ +----------+--------+--------+--------+-------------------+ SubclavianPSV cm/sEDV cm/sDescribeArm Pressure (mmHG) +----------+--------+--------+--------+-------------------+           228                                         +----------+--------+--------+--------+-------------------+ +---------+--------+--+--------+--+---------+ VertebralPSV cm/s58EDV cm/s10Antegrade +---------+--------+--+--------+--+---------+  Summary: Right Carotid: Velocities 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 Left Carotid: Velocities in the left ICA are consistent with a 40-59% stenosis. Vertebrals: Bilateral vertebral arteries demonstrate antegrade flow. *See table(s) above for measurements and observations.  Electronically signed by Antony Contras MD on 03/18/2018 at 12:25:24 PM.    Final    Vas [place] Transcranial Doppler  Result Date: 03/18/2018  Transcranial Doppler Indications: Stroke. Performing Technologist: Abram Sander RVS  Examination Guidelines: A complete evaluation includes B-mode imaging, spectral Doppler, color Doppler, and power Doppler as needed of all accessible portions of each vessel. Bilateral testing is considered an integral part of a complete examination. Limited examinations for reoccurring indications may be performed as noted.  +----------+-------------+----------+-----------+-------+ RIGHT TCD Right VM (cm)Depth (cm)PulsatilityComment +----------+-------------+----------+-----------+-------+ MCA           54.00                 1.52            +----------+-------------+----------+-----------+-------+ ACA           25.00                 1.87            +----------+-------------+----------+-----------+-------+ Term ICA      21.00                 1.32            +----------+-------------+----------+-----------+-------+ Opthalmic     15.00                 1.84            +----------+-------------+----------+-----------+-------+ ICA siphon    25.00                 1.54            +----------+-------------+----------+-----------+-------+  +----------+------------+----------+-----------+-------+ LEFT TCD  Left VM (cm)Depth (cm)PulsatilityComment +----------+------------+----------+-----------+-------+ MCA          50.00                 1.41            +----------+------------+----------+-----------+-------+ Term ICA     39.00                 1.55             +----------+------------+----------+-----------+-------+ PCA  39.00                 1.31            +----------+------------+----------+-----------+-------+ Opthalmic    18.00                 1.69            +----------+------------+----------+-----------+-------+ ICA siphon   49.00                 1.75            +----------+------------+----------+-----------+-------+ Distal ICA   26.00                 1.60            +----------+------------+----------+-----------+-------+  Summary:  Absent occipital window limits evaluation of posterior circulation. Normal mean flow velocities in all identified vessels of anterior circulation. Globally elevated pulsatility indexes suggestive likely diffuse intracranial atherosclerosis. *See table(s) above for measurements and observations.  Diagnosing physician: Antony Contras MD Electronically signed by Antony Contras MD on 03/18/2018 at 12:26:44 PM.    Final     Subjective: Seen and examined at bedside and had significantly improving her oxygen requirement and was weaned back down to her home dose of 1 L.  No nausea or vomiting.  Denies chest pain.  Ambulated with physical therapy and desaturated on room air but was placed back on 1 L and her O2 saturations improved to 97%.  She is due medically stable need to follow-up with PCP and pulmonary in outpatient setting will be going home with home health and she understands and agrees with plan.  Discharge Exam: Vitals:   04/07/18 1116 04/07/18 1401  BP: 126/63   Pulse: 92   Resp: 19   Temp: 98.7 F (37.1 C)   SpO2: 92% 93%   Vitals:   04/07/18 0747 04/07/18 0749 04/07/18 1116 04/07/18 1401  BP:  (!) 133/54 126/63   Pulse:  94 92   Resp:  18 19   Temp:  (!) 97.3 F (36.3 C) 98.7 F (37.1 C)   TempSrc:  Oral Oral   SpO2: 93% 96% 92% 93%  Weight:      Height:       General: Pt is alert, awake, not in acute distress Cardiovascular: RRR, S1/S2 +, no rubs, no gallops Respiratory:  Diminished bilaterally, no wheezing, no rhonchi Abdominal: Soft, NT, ND, bowel sounds + Extremities: Trace edema, no cyanosis  The results of significant diagnostics from this hospitalization (including imaging, microbiology, ancillary and laboratory) are listed below for reference.    Microbiology: Recent Results (from the past 240 hour(s))  Respiratory Panel by PCR     Status: None   Collection Time: 04/03/18  1:26 PM  Result Value Ref Range Status   Adenovirus NOT DETECTED NOT DETECTED Final   Coronavirus 229E NOT DETECTED NOT DETECTED Final    Comment: (NOTE) The Coronavirus on the Respiratory Panel, DOES NOT test for the novel  Coronavirus (2019 nCoV)    Coronavirus HKU1 NOT DETECTED NOT DETECTED Final   Coronavirus NL63 NOT DETECTED NOT DETECTED Final   Coronavirus OC43 NOT DETECTED NOT DETECTED Final   Metapneumovirus NOT DETECTED NOT DETECTED Final   Rhinovirus / Enterovirus NOT DETECTED NOT DETECTED Final   Influenza A NOT DETECTED NOT DETECTED Final   Influenza B NOT DETECTED NOT DETECTED Final   Parainfluenza Virus 1 NOT DETECTED NOT DETECTED Final   Parainfluenza Virus 2 NOT DETECTED NOT  DETECTED Final   Parainfluenza Virus 3 NOT DETECTED NOT DETECTED Final   Parainfluenza Virus 4 NOT DETECTED NOT DETECTED Final   Respiratory Syncytial Virus NOT DETECTED NOT DETECTED Final   Bordetella pertussis NOT DETECTED NOT DETECTED Final   Chlamydophila pneumoniae NOT DETECTED NOT DETECTED Final   Mycoplasma pneumoniae NOT DETECTED NOT DETECTED Final    Comment: Performed at [facility], [place] [address] [place], [place] [address]  MRSA PCR Screening     Status: None   Collection Time: 04/03/18  1:26 PM  Result Value Ref Range Status   MRSA by PCR NEGATIVE NEGATIVE Final    Comment:        The GeneXpert MRSA Assay (FDA approved for NASAL specimens only), is one component of a comprehensive MRSA colonization surveillance program. It is not intended to diagnose  MRSA infection nor to guide or monitor treatment for MRSA infections. Performed at [facility], [place] [address], [place], [place] [address]   Culture, Urine     Status: None   Collection Time: 04/03/18  4:34 PM  Result Value Ref Range Status   Specimen Description URINE, CLEAN CATCH  Final   Special Requests NONE  Final   Culture   Final    NO GROWTH Performed at [facility], [place] [address], [place], [place] [address]    Report Status 04/04/2018 FINAL  Final  Culture, blood (routine x 2)     Status: None (Preliminary result)   Collection Time: 04/04/18  9:30 AM  Result Value Ref Range Status   Specimen Description BLOOD LEFT FOREARM  Final   Special Requests   Final    BOTTLES DRAWN AEROBIC AND ANAEROBIC Blood Culture adequate volume   Culture   Final    NO GROWTH 3 DAYS Performed at [facility], [place] [address], [place], [place] [address]    Report Status PENDING  Incomplete  Culture, blood (routine x 2)     Status: None (Preliminary result)   Collection Time: 04/04/18 10:00 AM  Result Value Ref Range Status   Specimen Description BLOOD RIGHT ANTECUBITAL  Final   Special Requests   Final    BOTTLES DRAWN AEROBIC AND ANAEROBIC Blood Culture adequate volume   Culture   Final    NO GROWTH 3 DAYS Performed at [facility], [place] [address] [place],  [address]    Report Status PENDING  Incomplete    Labs: BNP (last 3 results) Recent Labs    03/16/18 0911 04/02/18 1805  BNP 279.6* 361.4*   Basic Metabolic Panel: Recent Labs  Lab 04/03/18 0407 04/04/18 0307 04/05/18 0333 04/06/18 0823 04/07/18 0333  NA 136 137 139 141 139  K 4.5 4.7 4.4 4.0 4.2  CL 104 109 108 108 106  CO2 17* 19* 19* 22 22  GLUCOSE 270* 242* 198* 142* 246*  BUN 30* 39* 50* 42* 41*  CREATININE 2.43* 2.36* 2.10* 1.81* 1.53*  CALCIUM 8.7* 8.8* 9.1 9.1 8.7*  MG 1.6* 2.4  --  2.1 2.0  PHOS 4.7*  --   --  4.5 4.0   Liver Function Tests: Recent Labs  Lab  04/03/18 0407 04/06/18 0823 04/07/18 0333  AST 13* 10* 10*  ALT 13 12 12   ALKPHOS 107 69 74  BILITOT 0.5 0.4 0.3  PROT 7.1 6.3* 5.6*  ALBUMIN 3.0* 2.9* 2.6*   No results for input(s): LIPASE, AMYLASE in the last 168 hours. No results for  input(s): AMMONIA in the last 168 hours. CBC: Recent Labs  Lab 04/02/18 1805  04/03/18 0407 04/04/18 0307 04/05/18 0333 04/06/18 0823 04/07/18 0333  WBC 15.1*  --  16.0* 18.1* 13.4* 10.7* 9.4  NEUTROABS 13.3*  --   --  16.8* 11.4* 8.2* 7.8*  HGB 9.5*   < > 9.7* 8.4* 7.9* 8.1* 7.7*  HCT 31.0*   < > 29.5* 26.3* 25.3* 25.2* 24.1*  MCV 84.5  --  81.9 83.0 83.0 83.4 83.1  PLT 341  --  356 313 292 284 264   < > = values in this interval not displayed.   Cardiac Enzymes: Recent Labs  Lab 04/02/18 2202 04/03/18 0407 04/03/18 1015  TROPONINI <0.03 <0.03 <0.03   BNP: Invalid input(s): POCBNP CBG: Recent Labs  Lab 04/06/18 2348 04/07/18 0322 04/07/18 0800 04/07/18 1118 04/07/18 1556  GLUCAP 220* 234* 172* 237* 380*   D-Dimer No results for input(s): DDIMER in the last 72 hours. Hgb A1c No results for input(s): HGBA1C in the last 72 hours. Lipid Profile No results for input(s): CHOL, HDL, LDLCALC, TRIG, CHOLHDL, LDLDIRECT in the last 72 hours. Thyroid function studies No results for input(s): TSH, T4TOTAL, T3FREE, THYROIDAB in the last 72 hours.  Invalid input(s): FREET3 Anemia work up No results for input(s): VITAMINB12, FOLATE, FERRITIN, TIBC, IRON, RETICCTPCT in the last 72 hours. Urinalysis    Component Value Date/Time   COLORURINE YELLOW 04/03/2018 1649   APPEARANCEUR CLOUDY (A) 04/03/2018 1649   LABSPEC 1.015 04/03/2018 1649   PHURINE 5.0 04/03/2018 1649   GLUCOSEU NEGATIVE 04/03/2018 1649   GLUCOSEU 250 (A) 08/17/2017 1411   HGBUR MODERATE (A) 04/03/2018 1649   BILIRUBINUR NEGATIVE 04/03/2018 1649   KETONESUR NEGATIVE 04/03/2018 1649   PROTEINUR NEGATIVE 04/03/2018 1649   UROBILINOGEN 0.2 08/17/2017 1411    NITRITE NEGATIVE 04/03/2018 1649   LEUKOCYTESUR NEGATIVE 04/03/2018 1649   Sepsis Labs Invalid input(s): PROCALCITONIN,  WBC,  LACTICIDVEN Microbiology Recent Results (from the past 240 hour(s))  Respiratory Panel by PCR     Status: None   Collection Time: 04/03/18  1:26 PM  Result Value Ref Range Status   Adenovirus NOT DETECTED NOT DETECTED Final   Coronavirus 229E NOT DETECTED NOT DETECTED Final    Comment: (NOTE) The Coronavirus on the Respiratory Panel, DOES NOT test for the novel  Coronavirus (2019 nCoV)    Coronavirus HKU1 NOT DETECTED NOT DETECTED Final   Coronavirus NL63 NOT DETECTED NOT DETECTED Final   Coronavirus OC43 NOT DETECTED NOT DETECTED Final   Metapneumovirus NOT DETECTED NOT DETECTED Final   Rhinovirus / Enterovirus NOT DETECTED NOT DETECTED Final   Influenza A NOT DETECTED NOT DETECTED Final   Influenza B NOT DETECTED NOT DETECTED Final   Parainfluenza Virus 1 NOT DETECTED NOT DETECTED Final   Parainfluenza Virus 2 NOT DETECTED NOT DETECTED Final   Parainfluenza Virus 3 NOT DETECTED NOT DETECTED Final   Parainfluenza Virus 4 NOT DETECTED NOT DETECTED Final   Respiratory Syncytial Virus NOT DETECTED NOT DETECTED Final   Bordetella pertussis NOT DETECTED NOT DETECTED Final   Chlamydophila pneumoniae NOT DETECTED NOT DETECTED Final   Mycoplasma pneumoniae NOT DETECTED NOT DETECTED Final    Comment: Performed at [facility], [place]. [address] [place], [place] [address]  MRSA PCR Screening     Status: None   Collection Time: 04/03/18  1:26 PM  Result Value Ref Range Status   MRSA by PCR NEGATIVE NEGATIVE Final    Comment:  The GeneXpert MRSA Assay (FDA approved for NASAL specimens only), is one component of a comprehensive MRSA colonization surveillance program. It is not intended to diagnose MRSA infection nor to guide or monitor treatment for MRSA infections. Performed at [facility], [place] [address] [place], [place] [address]    Culture, Urine     Status: None   Collection Time: 04/03/18  4:34 PM  Result Value Ref Range Status   Specimen Description URINE, CLEAN CATCH  Final   Special Requests NONE  Final   Culture   Final    NO GROWTH Performed at [facility], [place] [address] [place], [place] [address]    Report Status 04/04/2018 FINAL  Final  Culture, blood (routine x 2)     Status: None (Preliminary result)   Collection Time: 04/04/18  9:30 AM  Result Value Ref Range Status   Specimen Description BLOOD LEFT FOREARM  Final   Special Requests   Final    BOTTLES DRAWN AEROBIC AND ANAEROBIC Blood Culture adequate volume   Culture   Final    NO GROWTH 3 DAYS Performed at [facility], [place] [address] [place], [place] [address]    Report Status PENDING  Incomplete  Culture, blood (routine x 2)     Status: None (Preliminary result)   Collection Time: 04/04/18 10:00 AM  Result Value Ref Range Status   Specimen Description BLOOD RIGHT ANTECUBITAL  Final   Special Requests   Final    BOTTLES DRAWN AEROBIC AND ANAEROBIC Blood Culture adequate volume   Culture   Final    NO GROWTH 3 DAYS Performed at [facility], [place] [address] [place], [place] [address]    Report Status PENDING  Incomplete   Time coordinating discharge: 35 minutes  SIGNED:  Kerney Elbe, DO [name] 04/07/2018, 7:45 PM Pager is on [place]  If 7PM-7AM, please contact night-coverage [email or web address] Password [email or web address]

## 2018-04-07 NOTE — Progress Notes (Signed)
Occupational Therapy Treatment Patient Details Name: April Branch MRN: [id number] DOB: 1945-05-15 Today's Date: 04/07/2018    History of present illness April Branch is a 73yo F who comes to [facility] on 2/8 c hypoxia and dyspnea, Pt was recently admitted in Jan and DC with home O2. PMH: hypoTSH, HTN, CKD3, PVD, CAD, IDDM2.    OT comments  Pt now on 1L 02 which is her baseline. Performed dressing, toileting and standing grooming with supervision to modified independence. Ambulated in hall with supervision for safety and no device, trailing walls at time. Reinforced energy conservation strategies.  Follow Up Recommendations  Supervision - Intermittent    Equipment Recommendations  3 in 1 bedside commode    Recommendations for Other Services      Precautions / Restrictions Precautions Precautions: Fall Precaution Comments: monitor O2 sats       Mobility Bed Mobility Overal bed mobility: Independent                Transfers Overall transfer level: Modified independent Equipment used: None                  Balance     Sitting balance-Leahy Scale: Good       Standing balance-Leahy Scale: Fair                             ADL either performed or assessed with clinical judgement   ADL Overall ADL's : Needs assistance/impaired     Grooming: Supervision/safety;Wash/dry hands;Standing           Upper Body Dressing : Independent   Lower Body Dressing: Sitting/lateral leans;Independent   Toilet Transfer: Modified Independent;Stand-pivot;BSC   Toileting- Clothing Manipulation and Hygiene: Sit to/from stand;Modified independent       Functional mobility during ADLs: Supervision/safety General ADL Comments: pt maintained Sp02 above 90% on 1L with ambulation     Vision       Perception     Praxis      Cognition Arousal/Alertness: Awake/alert Behavior During Therapy: WFL for tasks assessed/performed Overall Cognitive Status:  Within Functional Limits for tasks assessed                                          Exercises     Shoulder Instructions       General Comments      Pertinent Vitals/ Pain       Pain Assessment: No/denies pain  Home Living                                          Prior Functioning/Environment              Frequency  Min 2X/week        Progress Toward Goals  OT Goals(current goals can now be found in the care plan section)  Progress towards OT goals: Progressing toward goals  Acute Rehab OT Goals Patient Stated Goal: Feel better OT Goal Formulation: With patient Time For Goal Achievement: 04/18/18 Potential to Achieve Goals: Good  Plan Discharge plan remains appropriate    Co-evaluation                 AM-PAC OT "6 Clicks" Daily Activity  Outcome Measure   Help from another person eating meals?: None Help from another person taking care of personal grooming?: None Help from another person toileting, which includes using toliet, bedpan, or urinal?: None Help from another person bathing (including washing, rinsing, drying)?: A Little Help from another person to put on and taking off regular upper body clothing?: None Help from another person to put on and taking off regular lower body clothing?: None 6 Click Score: 23    End of Session Equipment Utilized During Treatment: Oxygen(1L)  OT Visit Diagnosis: Other (comment)(decreased activity tolerance)   Activity Tolerance Patient tolerated treatment well   Patient Left in bed;with call bell/phone within reach;Other (comment)(with RT)   Nurse Communication          Time: 2707-8675 OT Time Calculation (min): 19 min  Charges: OT General Charges $OT Visit: 1 Visit OT Treatments $Self Care/Home Management : 8-22 mins  Nestor Lewandowsky, OTR/L Acute Rehabilitation Services Pager: [phone] Office: [phone]   Malka So 04/07/2018, 2:15  PM

## 2018-04-07 NOTE — Care Management Note (Signed)
Case Management Note Marvetta Gibbons RN, BSN Transitions of Care Unit 4E- RN Case Manager [phone]  Patient Details  Name: April Branch MRN: [id number] Date of Birth: 1945-07-02  Subjective/Objective:    Pt admitted with acute on chronic resp. Failure, currently on HF [place].                Action/Plan: PTA pt lived at home with SO, was recently sent home with home 02 arranged with [facility], and referral for HHPT/OT with [facility] however [facility] could not reach pt and pt did not return messages so HH was never started-  CM will follow for transition of care needs- if returns home will need new orders for [facility].   Expected Discharge Date:  04/07/18               Expected Discharge Plan:  [place]  In-House Referral:  NA  Discharge planning Services  CM Consult  Post Acute Care Choice:  Home Health, Durable Medical Equipment Choice offered to:  Patient  DME Arranged:  3-N-1, Nebulizer/meds DME Agency:  [place]:  RN, PT, OT, Nurse's Aide [place] Agency:  [place]  Status of Service:  Completed, signed off  If discussed at [place] of Stay Meetings, dates discussed:    Discharge Disposition: home/home health   Additional Comments:  04/07/18- 1530- Marvetta Gibbons RN, CM- pt for transition back home today, is back down to baseline 02 1L, per PT/OT recommendations orders have been placed for HHRN/PT/OT/aide- CM spoke with pt at bedside- list provided to pt Per CMS guidelines from medicare.gov website with star ratings (copy placed in shadow chart)- pt was set up with [facility] on last discharge and states she wants to have them again. Also has home 02 with [facility] however pt is stating that she and her SO have "unplugged" everything and need assistance tonight to get everything set back up. Pt does report that they have a portable tank that SO can bring for transport.  Call made to [facility] with [facility] for [facility], DME and home 02 needs- referral has been  accepted for for HHRN/PT/OT- have been instructed that pt will need to call customer support for her home 02 when she gets home - # has been placed on AVS for pt. DME- 3n1 and nebulizer ordered- per pt she has BSC at home- and only need nebulizer- [facility] to deliver Nebulizer to bedside prior to discharge.     Dawayne Patricia, RN 04/07/2018, 4:34 PM

## 2018-04-07 NOTE — Progress Notes (Signed)
Inpatient Diabetes Program Recommendations  AACE/ADA: New Consensus Statement on Inpatient Glycemic Control (2015)  Target Ranges:  Prepandial:   less than 140 mg/dL      Peak postprandial:   less than 180 mg/dL (1-2 hours)      Critically ill patients:  140 - 180 mg/dL   Lab Results  Component Value Date   GLUCAP 237 (H) 04/07/2018   HGBA1C 7.9 (H) 04/03/2018    Review of Glycemic ControlResults for ANALIA, ZUK (MRN [id number]) as of 04/07/2018 14:18  Ref. Range 04/06/2018 21:56 04/06/2018 23:48 04/07/2018 03:22 04/07/2018 08:00 04/07/2018 11:18  Glucose-Capillary Latest Ref Range: 70 - 99 mg/dL 235 (H) 220 (H) 234 (H) 172 (H) 237 (H)   Diabetes history:DM2 Outpatient Diabetes medications:Omni-Pod with Humalog Current orders for Inpatient glycemic control:Lantus 14 units QHS, Novolog 3 units TID with meals, Novolog 0-9 units Q4H; Prednisone 40 mg QAM Inpatient Diabetes Program Recommendations:   Please consider increasing Lantus to 18 units q HS.  Also consider increasing Novolog meal coverage to 4 units tid with meals (hold if patient eats less than 50%).   Thanks  Adah Perl, RN, BC-ADM Inpatient Diabetes Coordinator Pager [phone] (8a-5p)

## 2018-04-08 ENCOUNTER — Ambulatory Visit: Payer: Medicare Other | Admitting: Endocrinology

## 2018-04-09 LAB — CULTURE, BLOOD (ROUTINE X 2)
Culture: NO GROWTH
Culture: NO GROWTH
Special Requests: ADEQUATE
Special Requests: ADEQUATE

## 2018-04-11 ENCOUNTER — Encounter: Payer: Self-pay | Admitting: Cardiovascular Disease

## 2018-04-11 ENCOUNTER — Telehealth: Payer: Self-pay | Admitting: Cardiovascular Disease

## 2018-04-11 NOTE — Telephone Encounter (Signed)
She has normal EF and no current heart issues should call her primary care doctor about this

## 2018-04-11 NOTE — Telephone Encounter (Signed)
° ° ° °\  Pt c/o swelling: STAT is pt has developed SOB within 24 hours  1) How much weight have you gained and in what time span? n/a  2) If swelling, where is the swelling located? Legs, ankles, feet  3) Are you currently taking a fluid pill? yes  4) Are you currently SOB?  Wears  1L o2  5) Do you have a log of your daily weights (if so, list)? No scale at home  6) Have you gained 3 pounds in a day or 5 pounds in a week? N/A  7) Have you traveled recently? NO

## 2018-04-11 NOTE — Telephone Encounter (Signed)
error 

## 2018-04-11 NOTE — Progress Notes (Signed)
Cardiology Office Note    Date:  04/13/2018   ID:  April Branch, April Branch 1945-12-23, MRN [id number]  PCP:  April Jewel, MD  Cardiologist: April Rouge, MD  No chief complaint on file.   History of Present Illness:   73 y.o. with atypical chest pain. Normal cath 2003 and 2013. Seen by PA 04/20/17 chest pain f/u myovue 04/29/17 Normal. Palpitations with PaC;s put back on Toprol. Event monitor 04/29/17 benign PAC/PVC less than 1% of beats  PVD sees April Branch. Previous left femoropopliteal bypass x 2. Bradycardia in setting of severe hypothyroidism after stopping meds August 2018  Carotid 03/17/18  bilateral 40-59% ICA stenosis .   Admitted to hospital on 04/02/18 with dyspnea and panic attack Thought to have COPD exacerbation ILD and ? CHF Rx with solumedrol Brovana/Pulmicort nebs and doxycycline , Iv antibiotics and d/c with augmentin Still smoking Pulmonary saw and thought she had Multilobar pneumonia and d/c on 1L oxygen to f/u with April Branch Confirmed by CT and had low prob V/Q Also had Urinary retention and azotemia with Cr 2.6 on admission improved to baseline 1.5 on d/c Weight was up from Baseline 156 to 168 and sent home on Lasix 20 mg daily There has also been a question of adrenal insufficiency with Rx florinef at one point April Branch   PT ast home called office 2/17 about LE edema and dyspnea She has COPD and is on 1 L oxygen at home   Last TTE 03/17/18 EF 60-65% no valve disease indeterminate diastolic function   Glad to be home with her dogs April Branch and April Branch   Past Medical History:  Diagnosis Date  . Anemia   . Anxiety   . Arthritis   . CAD (coronary artery disease) 2016   non-obstructive at cath  . Colon polyps 09/11/2010   Tubular adenoma  . COPD 12/14/2008   PATIENT DENIES    . Depression   . Diabetic coma ([place]) Feb. 2014  . Fall at home 2016   x 2 in January 2016, Left knee  . Fibromyalgia   . GERD 07/20/2006  . Headache(784.0)   . History of  transient ischemic attack (TIA)   . HPV (human papilloma virus) infection   . Hyperlipidemia   . Hypertension   . Hypothyroidism   . Insulin dependent type 2 diabetes mellitus, controlled ([place]) 1989  . Lumbar disc disease   . Neuromuscular disorder ([place])    PERIPHERAL NEUROPATHY  . Peripheral vascular disease ([place])   . Personal history of malignant neoplasm of kidney(V10.52) 12/14/2008   Laparoscopic biopsy and cryoablation 7/08 April. Vernie Branch   . Renal disorder    chronic kidney disease   . Stroke [facility])    3 MINI STROKES  RT SIDED WEAKNESS  . Vertigo     Past Surgical History:  Procedure Laterality Date  . ABDOMINAL AORTAGRAM N/A 08/21/2011   Procedure: ABDOMINAL Maxcine Ham;  Surgeon: Elam Dutch, MD;  Location: [facility] CATH LAB;  Service: Cardiovascular;  Laterality: N/A;  . ABDOMINAL AORTAGRAM N/A 03/16/2014   Procedure: ABDOMINAL Maxcine Ham;  Surgeon: Elam Dutch, MD;  Location: [facility] CATH LAB;  Service: Cardiovascular;  Laterality: N/A;  . ABDOMINAL HYSTERECTOMY    . APPENDECTOMY    . BLADDER SUSPENSION    . CARDIAC CATHETERIZATION    . CHOLECYSTECTOMY OPEN    . ESOPHAGOGASTRODUODENOSCOPY (EGD) WITH PROPOFOL N/A 05/16/2014   Procedure: ESOPHAGOGASTRODUODENOSCOPY (EGD) WITH PROPOFOL with Balloon dilation;  Surgeon: Milus Banister, MD;  Location: [facility] ENDOSCOPY;  Service: Endoscopy;  Laterality: N/A;  . ESOPHAGOGASTRODUODENOSCOPY (EGD) WITH PROPOFOL N/A 09/02/2015   Procedure: ESOPHAGOGASTRODUODENOSCOPY (EGD) WITH PROPOFOL;  Surgeon: Doran Stabler, MD;  Location: [place];  Service: Gastroenterology;  Laterality: N/A;  . FEMORAL-POPLITEAL BYPASS GRAFT  10/12/2011   Procedure: BYPASS GRAFT FEMORAL-POPLITEAL ARTERY;  Surgeon: Elam Dutch, MD;  Location: [facility] OR;  Service: Vascular;  Laterality: Left;  Left Femoral-Popliteal Bypass Graft using 62mm x 80cm Propaten Graft with intraop arteriogram times one.  . FEMORAL-POPLITEAL BYPASS GRAFT Left 04/17/2014   Procedure: REDO LEFT  FEMORAL-POPLITEAL ARTERY BYPASS USING GORE PROPATEN 1mmx80cm GRAFT;  Surgeon: Elam Dutch, MD;  Location: [place];  Service: Vascular;  Laterality: Left;  . FOOT SURGERY Left    "bone spurs"  . INTRAOPERATIVE ARTERIOGRAM  10/12/2011   Procedure: INTRA OPERATIVE ARTERIOGRAM;  Surgeon: Elam Dutch, MD;  Location: [place];  Service: Vascular;  Laterality: Left;  . KNEE ARTHROSCOPY Bilateral    "cartilage"  . Laparascopic cryoablation of left kidney  08/2006   April. Gladis Branch for renal cell cancer  . LEFT HEART CATHETERIZATION WITH CORONARY ANGIOGRAM N/A 05/11/2011   Procedure: LEFT HEART CATHETERIZATION WITH CORONARY ANGIOGRAM;  Surgeon: Jolaine Artist, MD;  Location: [facility] CATH LAB;  Service: Cardiovascular;  Laterality: N/A;  . LUNG SURGERY Right    lung nodule removed from the right side  . PR VEIN BYPASS GRAFT,AORTO-FEM-POP  05/27/2010  . SHOULDER ARTHROSCOPY Bilateral    'Spurs"  . TUBAL LIGATION      Current Medications: Current Meds  Medication Sig  . ACCU-CHEK FASTCLIX LANCETS MISC Use 4 per day to check blood sugar dx code E11.9  . acetaminophen (TYLENOL) 325 MG tablet Take 2 tablets (650 mg total) by mouth every 6 (six) hours as needed for mild pain (or Fever >/= 101).  Marland Kitchen albuterol (PROVENTIL HFA;VENTOLIN HFA) 108 (90 Base) MCG/ACT inhaler Inhale 1-2 puffs into the lungs every 6 (six) hours as needed for wheezing or shortness of breath.  Marland Kitchen aspirin EC 325 MG EC tablet Take 1 tablet (325 mg total) by mouth daily.  . cilostazol (PLETAL) 100 MG tablet Take 100 mg by mouth 2 (two) times daily.  . DULoxetine (CYMBALTA) 60 MG capsule Take 1 capsule (60 mg total) by mouth daily.  . ferrous sulfate 325 (65 FE) MG tablet Take 325 mg by mouth daily with breakfast.   . fludrocortisone (FLORINEF) 0.1 MG tablet Take 0.1 mg by mouth See admin instructions. Take one tablet (0.1 mg) by mouth daily on Monday, Tuesday, Wednesday 1st week, then take one tablet (0.1 mg) on Thursday, Friday, Saturday  2nd week, then repeat  . fluticasone furoate-vilanterol (BREO ELLIPTA) 100-25 MCG/INH AEPB Inhale 1 puff into the lungs daily.  . furosemide (LASIX) 20 MG tablet Take 1 tablet (20 mg total) by mouth daily.  Marland Kitchen gabapentin (NEURONTIN) 300 MG capsule Take 1 capsule (300 mg total) by mouth 2 (two) times daily.  Marland Kitchen glucose blood (CONTOUR NEXT TEST) test strip Use to test blood sugar 3 times daily E11.9  . guaiFENesin (MUCINEX) 600 MG 12 hr tablet Take 1 tablet (600 mg total) by mouth 2 (two) times daily.  Marland Kitchen HYDROcodone-acetaminophen (NORCO) 10-325 MG tablet Take 1 tablet by mouth 3 (three) times daily as needed (for pain). Follow up with PCP if refills needed  . hydrOXYzine (ATARAX/VISTARIL) 25 MG tablet Take 1 tablet (25 mg total) by mouth every 6 (six) hours. (Patient taking differently: Take 25 mg by  mouth at bedtime. )  . insulin lispro (HUMALOG) 100 UNIT/ML injection Use 70 units daily in insulin pump (Patient taking differently: Inject 70 Units into the skin See admin instructions. Use 70 units daily in insulin pump)  . Insulin Pen Needle (BD PEN NEEDLE NANO U/F) 32G X 4 MM MISC Use to inject insulin  . ipratropium-albuterol (DUONEB) 0.5-2.5 (3) MG/3ML SOLN Take 3 mLs by nebulization every 4 (four) hours as needed.  . lansoprazole (PREVACID) 30 MG capsule Take 1 capsule (30 mg total) by mouth daily.  Marland Kitchen levothyroxine (SYNTHROID, LEVOTHROID) 125 MCG tablet Take 1 tablet (125 mcg total) by mouth daily before breakfast.  . metoprolol succinate (TOPROL-XL) 25 MG 24 hr tablet TAKE 1 TABLET BY MOUTH EVERY DAY (Patient taking differently: Take 25 mg by mouth daily. )  . nicotine (NICODERM CQ - DOSED IN MG/24 HOURS) 14 mg/24hr patch Place 1 patch (14 mg total) onto the skin daily.  . nortriptyline (PAMELOR) 75 MG capsule Take 75 mg by mouth at bedtime.  . OXYGEN Inhale 1 L into the lungs as needed (shortness of breath).  . polyethylene glycol (MIRALAX / GLYCOLAX) packet Take 17 g by mouth daily. (Patient  taking differently: Take 17 g by mouth daily as needed (constipation). )  . potassium chloride (KLOR-CON 10) 10 MEQ tablet Take 1 tablet (10 mEq total) by mouth 2 (two) times daily.  . predniSONE (DELTASONE) 10 MG tablet Take 4 tablets (40 mg total) by mouth daily for 5 days, THEN 3 tablets (30 mg total) daily for 5 days, THEN 2 tablets (20 mg total) daily for 5 days, THEN 1 tablet (10 mg total) daily for 5 days.  Marland Kitchen QUEtiapine (SEROQUEL) 100 MG tablet Take 100 mg by mouth at bedtime.  . senna (SENOKOT) 8.6 MG TABS tablet Take 2 tablets (17.2 mg total) by mouth daily. (Patient taking differently: Take 1 tablet by mouth daily. )  . senna-docusate (SENOKOT-S) 8.6-50 MG tablet Take 1 tablet by mouth 2 (two) times daily.  . tamsulosin (FLOMAX) 0.4 MG CAPS capsule Take 1 capsule (0.4 mg total) by mouth daily after supper.  . tolterodine (DETROL LA) 4 MG 24 hr capsule Take 4 mg by mouth daily.   Marland Kitchen triamcinolone cream (KENALOG) 0.1 % Apply 1 application topically 2 (two) times daily.  . Vitamin D, Ergocalciferol, (DRISDOL) 50000 units CAPS capsule Take 50,000 Units by mouth every Monday.   . [DISCONTINUED] cilostazol (PLETAL) 100 MG tablet TAKE 1 TABLET BY MOUTH TWICE A DAY BEFORE MEALS (Patient taking differently: Take 100 mg by mouth 2 (two) times daily before a meal. )  . [DISCONTINUED] fludrocortisone (FLORINEF) 0.1 MG tablet Take 1 tablet (0.1 mg total) by mouth 3 (three) times a week. (Patient taking differently: Take 0.1 mg by mouth See admin instructions. Take one tablet (0.1 mg) by mouth daily on Monday, Tuesday, Wednesday 1st week, then take one tablet (0.1 mg) on Thursday, Friday, Saturday 2nd week, then repeat)     Allergies:   Patient has no known allergies.   Social History   Socioeconomic History  . Marital status: Widowed    Spouse name: Not on file  . Number of children: 2  . Years of education: 8th grade  . Highest education level: Not on file  Occupational History  . Occupation:  Retired    Fish farm manager: NOT EMPLOYED  Social Needs  . Financial resource strain: Not on file  . Food insecurity:    Worry: Not on file  Inability: Not on file  . Transportation needs:    Medical: Not on file    Non-medical: Not on file  Tobacco Use  . Smoking status: Current Every Day Smoker    Packs/day: 1.50    Years: 50.00    Pack years: 75.00    Types: Cigarettes    Last attempt to quit: 06/24/2014    Years since quitting: 3.8  . Smokeless tobacco: Never Used  Substance and Sexual Activity  . Alcohol use: No    Alcohol/week: 0.0 standard drinks  . Drug use: No  . Sexual activity: Never  Lifestyle  . Physical activity:    Days per week: Not on file    Minutes per session: Not on file  . Stress: Not on file  Relationships  . Social connections:    Talks on phone: Not on file    Gets together: Not on file    Attends religious service: Not on file    Active member of club or organization: Not on file    Attends meetings of clubs or organizations: Not on file    Relationship status: Not on file  Other Topics Concern  . Not on file  Social History Narrative   Widow. Two sons. Husband died of lung cancer.     Right-handed.   She lives at home with her friend, [name] and her son.   2-4 bottles of Coke per day.           Family History:  The patient's family history includes Cancer in her father and mother; Heart disease in her father, mother, and sister; Lung cancer in her father and mother.   ROS:   Please see the history of present illness.    Review of Systems  Constitution: Negative.  HENT: Negative.   Eyes: Negative.   Cardiovascular: Positive for chest pain, claudication and dyspnea on exertion.  Respiratory: Negative.   Hematologic/Lymphatic: Negative.   Musculoskeletal: Negative.  Negative for joint pain.  Gastrointestinal: Negative.   Genitourinary: Negative.   Neurological: Negative.   Psychiatric/Behavioral: Positive for depression. The patient is  nervous/anxious.    All other systems reviewed and are negative.   PHYSICAL EXAM:   VS:  BP (!) 130/58   Pulse 77   Ht 5\' 1"  (1.549 m)   Wt 75.8 kg   SpO2 92%   BMI 31.58 kg/m   Physical Exam  Affect appropriate Chronically ill female  HEENT: normal Neck supple with no adenopathy JVP normal no bruits no thyromegaly Lungs clear with no wheezing and good diaphragmatic motion Heart:  S1/S2 no murmur, no rub, gallop or click PMI normal Abdomen: benighn, BS positve, no tenderness, no AAA Previous left femoropopliteal bypass x 2  No edema Neuro non-focal Skin warm and dry No muscular weakness   Wt Readings from Last 3 Encounters:  04/13/18 75.8 kg  04/07/18 76.2 kg  03/16/18 75.1 kg      Studies/Labs Reviewed:   EKG:  04/22/17 SR rate 61 low voltage PAC;s ICRBBB   Recent Labs: 04/02/2018: B Natriuretic Peptide 331.9 04/03/2018: TSH 0.688 04/07/2018: ALT 12; BUN 41; Creatinine, Ser 1.53; Hemoglobin 7.7; Magnesium 2.0; Platelets 264; Potassium 4.2; Sodium 139   Lipid Panel    Component Value Date/Time   CHOL 124 03/17/2018 0507   CHOL 219 (H) 04/20/2017 1047   TRIG 66 03/17/2018 0507   HDL 47 03/17/2018 0507   HDL 48 04/20/2017 1047   CHOLHDL 2.6 03/17/2018 0507   VLDL 13 03/17/2018  0507   LDLCALC 64 03/17/2018 0507   [place] Comment 04/20/2017 1047   LDLDIRECT 87.0 09/17/2017 1015    Additional studies/ records that were reviewed today include: 2D echo  03/17/18 Study Conclusions  - Left ventricle: The cavity size was normal. Systolic function was   normal. The estimated ejection fraction was in the range of 60%   to 65%. Wall motion was normal; there were no regional wall   motion abnormalities. The study is indeterminate for the   evaluation of LV diastolic function.  Impressions:  - Echo contrast use to better define wall motion. Normal LV EF,   indeterminate diastolic function, no wall motion abnormalities.   No significant valve disease.   Myovue  : 04/29/17 normal no ischemia EF 65%    Lower extremity ABIs 03/30/17 Final Interpretation: Right: Resting right ankle-brachial index indicates moderate right lower extremity arterial disease. The right toe-brachial index is abnormal. Decreased ankle-brachial index since prior exam of 03/27/2016 Left: Resting left ankle-brachial index indicates moderate left lower extremity arterial disease. The left toe-brachial index is abnormal.   Carotid Dopplers  03/17/18  Final Interpretation: Right Carotid: Velocities in the right ICA are consistent with a 40-59%                stenosis.  Left Carotid: Velocities in the left ICA are consistent with a 40-59% stenosis.     ASSESSMENT:    1. Essential hypertension   2. Coronary artery disease due to lipid rich plaque      PLAN:  In order of problems listed above:   Palpitations   CAD/Chest Pain  nonobstructive on cardiac cath 2003 and again in 2010, normal Lexiscan Myoview 2016 and  Again 04/29/17 suspect non cardiac etiology observe   Essential hypertension Well controlled.  Continue current medications and low sodium Dash type diet.    PVD status post femoropopliteal 2016 with recent ABIs showing progression of disease, 40-59% bilateral carotid stenosis-followed by April. [place] Alar  CKD last creatinine recent azotemia with pneumonia Cr baseline 1.53 04/07/18 BUN 41    HLD:  On pravachol   LDL 107 high TG;s low fat diet   Pulmonary:  COPD with ILD and recent pneumonia Still smoking Needs To have prednisone weaned to decrease fluid retention F/U [place] March Still requiring 1 L oxygen   April Branch

## 2018-04-11 NOTE — Telephone Encounter (Signed)
Physical therapist with home health calling about patient having non-pitting swelling in BLE (right worse than left) and SOB. PT stated patient had no swelling on Friday per home health nurse notes. Patient's O2 is 94% on 1 L of home O2, patient does have history of COPD. Patient does not have any scales at home to see if she has had any weight gain. Patient stated she is keeping to a low salt diet, elevating her legs, and taking all her medications. Patient has her yearly follow up on Wednesday. Will forward to Dr. Johnsie Cancel for further advisement.

## 2018-04-11 NOTE — Telephone Encounter (Signed)
Called patient with Dr. Kyla Balzarine recommendations. Patient verbalized understanding and will call her PCP.

## 2018-04-11 NOTE — Telephone Encounter (Deleted)
  Pt c/o swelling: STAT is pt has developed SOB within 24 hours  1) How much weight have you gained and in what time span? Not sure  2) If swelling, where is the swelling located? From thighs to feet  3) Are you currently taking a fluid pill? Yes and she is taking it as prescribed  4) Are you currently SOB? some  5) Do you have a log of your daily weights (if so, list)?no  6) Have you gained 3 pounds in a day or 5 pounds in a week? Not sure  7) Have you traveled recently? Not

## 2018-04-12 ENCOUNTER — Other Ambulatory Visit (HOSPITAL_COMMUNITY): Payer: Self-pay | Admitting: Internal Medicine

## 2018-04-12 ENCOUNTER — Encounter: Payer: Self-pay | Admitting: Cardiovascular Disease

## 2018-04-13 ENCOUNTER — Encounter: Payer: Self-pay | Admitting: Cardiovascular Disease

## 2018-04-13 ENCOUNTER — Other Ambulatory Visit (HOSPITAL_COMMUNITY): Payer: Self-pay | Admitting: Internal Medicine

## 2018-04-13 ENCOUNTER — Ambulatory Visit (INDEPENDENT_AMBULATORY_CARE_PROVIDER_SITE_OTHER): Payer: Medicare Other | Admitting: Cardiovascular Disease

## 2018-04-13 VITALS — BP 130/58 | HR 77 | Ht 61.0 in | Wt 167.1 lb

## 2018-04-13 DIAGNOSIS — I2583 Coronary atherosclerosis due to lipid rich plaque: Secondary | ICD-10-CM

## 2018-04-13 DIAGNOSIS — I2510 Atherosclerotic heart disease of native coronary artery without angina pectoris: Secondary | ICD-10-CM | POA: Diagnosis not present

## 2018-04-13 DIAGNOSIS — I10 Essential (primary) hypertension: Secondary | ICD-10-CM

## 2018-04-13 NOTE — Patient Instructions (Signed)

## 2018-04-18 ENCOUNTER — Ambulatory Visit (INDEPENDENT_AMBULATORY_CARE_PROVIDER_SITE_OTHER): Payer: Medicare Other | Admitting: Pulmonary Disease

## 2018-04-18 ENCOUNTER — Ambulatory Visit (INDEPENDENT_AMBULATORY_CARE_PROVIDER_SITE_OTHER)
Admission: RE | Admit: 2018-04-18 | Discharge: 2018-04-18 | Disposition: A | Discharge status: Home / Self Care | Payer: Medicare Other | Source: Ambulatory Visit | Attending: Pulmonary Disease | Admitting: Pulmonary Disease

## 2018-04-18 ENCOUNTER — Encounter: Payer: Self-pay | Admitting: Pulmonary Disease

## 2018-04-18 DIAGNOSIS — J849 Interstitial pulmonary disease, unspecified: Secondary | ICD-10-CM | POA: Diagnosis not present

## 2018-04-18 DIAGNOSIS — J449 Chronic obstructive pulmonary disease, unspecified: Secondary | ICD-10-CM | POA: Diagnosis not present

## 2018-04-18 DIAGNOSIS — J9611 Chronic respiratory failure with hypoxia: Secondary | ICD-10-CM | POA: Diagnosis not present

## 2018-04-18 MED ORDER — IPRATROPIUM-ALBUTEROL 0.5-2.5 (3) MG/3ML IN SOLN: 3.0000 mL | Freq: Four times a day (QID) | RESPIRATORY_TRACT | 1 refills | Status: DC | PRN

## 2018-04-18 NOTE — Progress Notes (Signed)
Subjective:    Patient ID: April Branch, female    DOB: 1945/10/06, 73 y.o.   MRN: [id number]  HPI  73 year old smoker, hypertensive and diabetic on insulin pump presents for post hospital follow-up. She was admitted 1/22 to 1/26 for left-sided weakness and inability to walk.  She has a prior history of CVA with residual left hemiparesis.  Chest x-ray was noted to have bilateral infiltrates, she required BiPAP initially and follow-up chest x-ray on 1/23 shows some improvement of infiltrates.  She was treated with IV Solu-Medrol, respiratory viral panel was negative , she seemed to improve with Lasix and oxygen requirements decreased from 4 L to 1 L rather quickly, she was discharged on 1 L oxygen but plan to follow-up with pulmonary office on 2/6.  Of note BNP was normal but she was still labeled as having acute on chronic diastolic heart failure.  She was unable to keep follow-up with pulmonary office.  Developed sudden onset shortness of breath overnight on 2/8, went to bed feeling okay and suddenly woke up feeling short of breath which is not relieved by oxygen, came into the emergency room with chest x-ray again showed worsening bilateral infiltrates predominantly in both upper lobes, BNP was 332, mild leukocytosis, she was on 30 mg of prednisone.  She required BiPAP overnight and transitioned to high flow nasal cannula and was finally discharged on 1 L oxygen She was unable to afford Breo or DuoNeb's and was sent to her pharmacy.  She has quit smoking cigarettes now. She developed 2+ pedal edema and Lasix was increased to 60 mg once daily    Significant tests/ events reviewed  04/04/18 Perfusion scan was negative for PE. 04/03/18 High-resolution CT chest  multilobar pneumonia rather than ILD   Past Medical History:  Diagnosis Date  . Anemia   . Anxiety   . Arthritis   . CAD (coronary artery disease) 2016   non-obstructive at cath  . Colon polyps 09/11/2010   Tubular adenoma   . COPD 12/14/2008   PATIENT DENIES    . Depression   . Diabetic coma ([place]) Feb. 2014  . Fall at home 2016   x 2 in January 2016, Left knee  . Fibromyalgia   . GERD 07/20/2006  . Headache(784.0)   . History of transient ischemic attack (TIA)   . HPV (human papilloma virus) infection   . Hyperlipidemia   . Hypertension   . Hypothyroidism   . 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 Lumbar disc disease   . Neuromuscular disorder ([place])    PERIPHERAL NEUROPATHY  . Peripheral vascular disease ([place])   . Personal history of malignant neoplasm of kidney(V10.52) 12/14/2008   Laparoscopic biopsy and cryoablation 7/08 Dr. Vernie Shanks   . Renal disorder    chronic kidney disease   . Stroke [facility])    3 MINI STROKES  RT SIDED WEAKNESS  . Vertigo     Past Surgical History:  Procedure Laterality Date  . ABDOMINAL AORTAGRAM N/A 08/21/2011   Procedure: ABDOMINAL Maxcine Ham;  Surgeon: Elam Dutch, MD;  Location: [facility] CATH LAB;  Service: Cardiovascular;  Laterality: N/A;  . ABDOMINAL AORTAGRAM N/A 03/16/2014   Procedure: ABDOMINAL Maxcine Ham;  Surgeon: Elam Dutch, MD;  Location: [facility] CATH LAB;  Service: Cardiovascular;  Laterality: N/A;  . ABDOMINAL HYSTERECTOMY    . APPENDECTOMY    . BLADDER SUSPENSION    . CARDIAC CATHETERIZATION    . CHOLECYSTECTOMY OPEN    .  ESOPHAGOGASTRODUODENOSCOPY (EGD) WITH PROPOFOL N/A 05/16/2014   Procedure: ESOPHAGOGASTRODUODENOSCOPY (EGD) WITH PROPOFOL with Balloon dilation;  Surgeon: Milus Banister, MD;  Location: [place];  Service: Endoscopy;  Laterality: N/A;  . ESOPHAGOGASTRODUODENOSCOPY (EGD) WITH PROPOFOL N/A 09/02/2015   Procedure: ESOPHAGOGASTRODUODENOSCOPY (EGD) WITH PROPOFOL;  Surgeon: Doran Stabler, MD;  Location: [place];  Service: Gastroenterology;  Laterality: N/A;  . FEMORAL-POPLITEAL BYPASS GRAFT  10/12/2011   Procedure: BYPASS GRAFT FEMORAL-POPLITEAL ARTERY;  Surgeon: Elam Dutch, MD;  Location: [facility] OR;   Service: Vascular;  Laterality: Left;  Left Femoral-Popliteal Bypass Graft using 71mm x 80cm Propaten Graft with intraop arteriogram times one.  . FEMORAL-POPLITEAL BYPASS GRAFT Left 04/17/2014   Procedure: REDO LEFT FEMORAL-POPLITEAL ARTERY BYPASS USING GORE PROPATEN 32mmx80cm GRAFT;  Surgeon: Elam Dutch, MD;  Location: [place];  Service: Vascular;  Laterality: Left;  . FOOT SURGERY Left    "bone spurs"  . INTRAOPERATIVE ARTERIOGRAM  10/12/2011   Procedure: INTRA OPERATIVE ARTERIOGRAM;  Surgeon: Elam Dutch, MD;  Location: [place];  Service: Vascular;  Laterality: Left;  . KNEE ARTHROSCOPY Bilateral    "cartilage"  . Laparascopic cryoablation of left kidney  08/2006   Dr. Gladis Riffle for renal cell cancer  . LEFT HEART CATHETERIZATION WITH CORONARY ANGIOGRAM N/A 05/11/2011   Procedure: LEFT HEART CATHETERIZATION WITH CORONARY ANGIOGRAM;  Surgeon: Jolaine Artist, MD;  Location: [facility] CATH LAB;  Service: Cardiovascular;  Laterality: N/A;  . LUNG SURGERY Right    lung nodule removed from the right side  . PR VEIN BYPASS GRAFT,AORTO-FEM-POP  05/27/2010  . SHOULDER ARTHROSCOPY Bilateral    'Spurs"  . TUBAL LIGATION      No Known Allergies  Social History   Socioeconomic History  . Marital status: Widowed    Spouse name: Not on file  . Number of children: 2  . Years of education: 8th grade  . Highest education level: Not on file  Occupational History  . 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 Financial resource strain: Not on file  . Food insecurity:    Worry: Not on file    Inability: Not on file  . Transportation needs:    Medical: Not on file    Non-medical: Not on file  Tobacco Use  . Smoking status: Current Every Day Smoker    Packs/day: 1.50    Years: 50.00    Pack years: 75.00    Types: Cigarettes    Last attempt to quit: 06/24/2014    Years since quitting: 3.8  . Smokeless tobacco: Never Used  Substance and Sexual Activity  . Alcohol use: No     Alcohol/week: 0.0 standard drinks  . Drug use: No  . Sexual activity: Never  Lifestyle  . Physical activity:    Days per week: Not on file    Minutes per session: Not on file  . Stress: Not on file  Relationships  . Social connections:    Talks on phone: Not on file    Gets together: Not on file    Attends religious service: Not on file    Active member of club or organization: Not on file    Attends meetings of clubs or organizations: Not on file    Relationship status: Not on file  . Intimate partner violence:    Fear of current or ex partner: Not on file    Emotionally abused: Not on file    Physically abused: Not on file  Forced sexual activity: Not on file  Other Topics Concern  . Not on file  Social History Narrative   Widow. Two sons. Husband died of lung cancer.     Right-handed.   She lives at home with her friend, [name] and her son.   2-4 bottles of Coke per day.          Family History  Problem Relation Age of Onset  . Heart disease Mother   . Lung cancer Mother   . Cancer Mother        Lung  . Heart disease Father   . Lung cancer Father   . Cancer Father        Lung  . Heart disease Sister        CABG- Open Heart      Review of Systems neg for any significant sore throat, dysphagia, itching, sneezing, nasal congestion or excess/ purulent secretions, fever, chills, sweats, unintended wt loss, pleuritic or exertional cp, hempoptysis, orthopnea pnd or change in chronic leg swelling. Also denies presyncope, palpitations, heartburn, abdominal pain, nausea, vomiting, diarrhea or change in bowel or urinary habits, dysuria,hematuria, rash, arthralgias, visual complaints, headache, numbness weakness or ataxia.     Objective:   Physical Exam   Gen. Pleasant, obese, in no distress, normal affect ENT - no pallor,icterus, no post nasal drip, class 2-3 airway Neck: No JVD, no thyromegaly, no carotid bruits Lungs: no use of accessory muscles, no dullness to  percussion, decreased without rales or rhonchi  Cardiovascular: Rhythm regular, heart sounds  normal, no murmurs or gallops, 2+ peripheral edema Abdomen: soft and non-tender, no hepatosplenomegaly, BS normal. Musculoskeletal: No deformities, no cyanosis or clubbing Neuro:  alert, non focal, no tremors        Assessment & Plan:

## 2018-04-18 NOTE — Assessment & Plan Note (Signed)
Prescription for duo nebs will be sent to DME company PFTs will further define degree of airway obstruction and based on this we will decide need for LABA/LABA

## 2018-04-18 NOTE — Assessment & Plan Note (Signed)
Ambulatory saturation assessment today. Saturation appears good at 98% at rest. She will continue in 1 L of oxygen during sleep.  We will reassess to see what happens as steroids are tapered this time

## 2018-04-18 NOTE — Patient Instructions (Signed)
Chest x-ray today. Ambulatory saturation.  Prescription for duo nebs every 6 hours as needed will be sent to DME company  Decrease prednisone as instructed-once down to 10 mg, stay at this dose until you see [place] again. PFTs on next visit in 2 weeks

## 2018-04-18 NOTE — Assessment & Plan Note (Addendum)
Waxing and waning bilateral pulmonary infiltrates -seem to be steroid responsive rather than diuresis.    BNP normal and low procalcitonin makes infection and fluid overload less likely Differential diagnosis is broad and includes RB ILD and BOOP Chest x-ray today.   Prescription for duo nebs every 6 hours as needed will be sent to DME company  Decrease prednisone as instructed-once down to 10 mg, stay at this dose until you see [place] again. PFTs on next visit in 2 weeks

## 2018-04-20 ENCOUNTER — Other Ambulatory Visit: Payer: Self-pay | Admitting: Endocrinology

## 2018-04-22 ENCOUNTER — Other Ambulatory Visit: Payer: Self-pay | Admitting: Endocrinology

## 2018-04-25 ENCOUNTER — Telehealth: Payer: Self-pay | Admitting: Pulmonary Disease

## 2018-04-25 MED ORDER — PREDNISONE 10 MG PO TABS: ORAL_TABLET | ORAL | 0 refills | Status: DC

## 2018-04-25 NOTE — Telephone Encounter (Signed)
Chest x-ray today. Ambulatory saturation.  Prescription for duo nebs every 6 hours as needed will be sent to DME company  Decrease prednisone as instructed-once down to 10 mg, stay at this dose until you see [place] again. PFTs on next visit in 2 weeks ________________________________________________________________________________  Hulen Skains and spoke with patient, she stated that she was supposed to stay on her prednisone until she sees [place] again at her next appointment. (AVS instructions printed above) patient is seeing [place] on 05/05/18 and she only has two days worth of medication left. I have sent in the remaining dosage needed. Verified pharmacy. Nothing further needed.

## 2018-05-05 ENCOUNTER — Other Ambulatory Visit: Payer: Self-pay

## 2018-05-05 ENCOUNTER — Encounter: Payer: Self-pay | Admitting: Pulmonary Disease

## 2018-05-05 ENCOUNTER — Ambulatory Visit (INDEPENDENT_AMBULATORY_CARE_PROVIDER_SITE_OTHER): Payer: Medicare Other | Admitting: Pulmonary Disease

## 2018-05-05 ENCOUNTER — Other Ambulatory Visit (HOSPITAL_COMMUNITY): Payer: Self-pay

## 2018-05-05 VITALS — BP 122/74 | HR 75 | Ht 61.0 in | Wt 164.6 lb

## 2018-05-05 DIAGNOSIS — J9611 Chronic respiratory failure with hypoxia: Secondary | ICD-10-CM | POA: Diagnosis not present

## 2018-05-05 DIAGNOSIS — J849 Interstitial pulmonary disease, unspecified: Secondary | ICD-10-CM

## 2018-05-05 DIAGNOSIS — R1319 Other dysphagia: Secondary | ICD-10-CM | POA: Diagnosis not present

## 2018-05-05 DIAGNOSIS — F17200 Nicotine dependence, unspecified, uncomplicated: Secondary | ICD-10-CM | POA: Diagnosis not present

## 2018-05-05 DIAGNOSIS — R1310 Dysphagia, unspecified: Secondary | ICD-10-CM

## 2018-05-05 MED ORDER — PREDNISONE 10 MG PO TABS: ORAL_TABLET | ORAL | 0 refills | Status: DC

## 2018-05-05 NOTE — Assessment & Plan Note (Signed)
Waxing and waning bilateral pulmonary infiltrates which seem to be steroid responsive-differential diagnosis includes RB ILD, BOOP and chronic aspiration also possible given her prior episodes of food impaction  Refill on prednisone 10 mg x 60 with 1 refill We discussed bronchoscopy and decided to hold off for now. You will work on quitting smoking  Modified barium swallow with speech therapist present Reschedule PFTs Chest x-ray on next visit.

## 2018-05-05 NOTE — Assessment & Plan Note (Signed)
Smoking cessation emphasized

## 2018-05-05 NOTE — Progress Notes (Signed)
Subjective:    Patient ID: April Branch, female    DOB: [birth year], 73 y.o.   MRN: [id number]  HPI  73 year old smoker,hypertensive and diabetic on insulin pump  For FU of ILD, waxing and waning pulmonary infiltrates?  Steroid responsive Other PMH - CKD, Addison's  She was admitted 1/22 to 1/26 for left-sided weakness and inability to walk. She has a prior history of CVA with residual left hemiparesis. Chest x-ray was noted to have bilateral infiltrates,  She was treated with IV Solu-Medrol, respiratory viral panel was negative ,she seemed to improve with Lasix and oxygen requirements decreased from 4 L to 1 L rather quickly, she was discharged on 1 L oxygen but plan to follow-up with pulmonary office on 2/6. Of note BNP was normal but she was still labeled as having acute on chronic diastolic heart failure.  2/8 readmitted  worsening bilateral infiltrates predominantly in both upper lobes, BNP was 332, mild leukocytosis, she was on 30 mg of prednisone - discharged on 1 L oxygen  Chest x-ray repeated on follow-up office visit 2/25 showed persistent bilateral infiltrates. PFTs have not been performed. More history obtained-there have been 2 episodes of food impaction in 2016 and 2017 requiring EGD.  She also reports chronic dysphagia and choking episodes  Breathing is much improved, she is able to stay off oxygen in the daytime.  Prednisone has been weaned to 10 mg.  She continues to smoke 5 to 10 cigarettes a day  She has been found to have a 1.3 cm complex cyst on right kidney and urology procedure is planned once respiratory symptoms improve  Significant tests/ events reviewed  04/04/18 Perfusion scan was negative for PE. 04/03/18 High-resolution CT chest  multilobar pneumonia rather than ILD 03/2018 ANA , RA , ANCA neg  Past Medical History:  Diagnosis Date  . Anemia   . Anxiety   . Arthritis   . CAD (coronary artery disease) 2016   non-obstructive at cath  . Colon  polyps 09/11/2010   Tubular adenoma  . COPD 12/14/2008   PATIENT DENIES    . Depression   . Diabetic coma ([place]) Feb. 2014  . Fall at home 2016   x 2 in January 2016, Left knee  . Fibromyalgia   . GERD 07/20/2006  . Headache(784.0)   . History of transient ischemic attack (TIA)   . HPV (human papilloma virus) infection   . Hyperlipidemia   . Hypertension   . Hypothyroidism   . Insulin dependent type 2 diabetes mellitus, controlled ([place]) 1989  . Lumbar disc disease   . Neuromuscular disorder ([place])    PERIPHERAL NEUROPATHY  . Peripheral vascular disease ([place])   . Personal history of malignant neoplasm of kidney(V10.52) 12/14/2008   Laparoscopic biopsy and cryoablation 7/08 Dr. Vernie Shanks   . Renal disorder    chronic kidney disease   . Stroke [facility])    3 MINI STROKES  RT SIDED WEAKNESS  . Vertigo      Review of Systems neg for any significant sore throat, dysphagia, itching, sneezing, nasal congestion or excess/ purulent secretions, fever, chills, sweats, unintended wt loss, pleuritic or exertional cp, hempoptysis, orthopnea pnd or change in chronic leg swelling. Also denies presyncope, palpitations, heartburn, abdominal pain, nausea, vomiting, diarrhea or change in bowel or urinary habits, dysuria,hematuria, rash, arthralgias, visual complaints, headache, numbness weakness or ataxia.     Objective:   Physical Exam   Gen. Pleasant, obese, in no distress, normal affect ENT - no pallor,icterus,  no post nasal drip, class 2-3 airway Neck: No JVD, no thyromegaly, no carotid bruits Lungs: no use of accessory muscles, no dullness to percussion, decreased without rales or rhonchi  Cardiovascular: Rhythm regular, heart sounds  normal, no murmurs or gallops, no peripheral edema Abdomen: soft and non-tender, no hepatosplenomegaly, BS normal. Musculoskeletal: No deformities, no cyanosis or clubbing Neuro:  alert, non focal, no tremors        Assessment & Plan:

## 2018-05-05 NOTE — Patient Instructions (Signed)
Refill on prednisone 10 mg x 60 with 1 refill We discussed bronchoscopy and decided to hold off for now. You will work on quitting smoking  Modified barium swallow with speech therapist present Reschedule PFTs Chest x-ray on next visit.

## 2018-05-05 NOTE — Assessment & Plan Note (Signed)
Continue 1 to 2 L of oxygen during sleep

## 2018-05-12 ENCOUNTER — Ambulatory Visit (INDEPENDENT_AMBULATORY_CARE_PROVIDER_SITE_OTHER): Payer: Medicare Other | Admitting: Pulmonary Disease

## 2018-05-12 ENCOUNTER — Other Ambulatory Visit: Payer: Self-pay

## 2018-05-12 DIAGNOSIS — J849 Interstitial pulmonary disease, unspecified: Secondary | ICD-10-CM

## 2018-05-12 LAB — PULMONARY FUNCTION TEST
DL/VA % pred: 126 %
DL/VA: 5.31 ml/min/mmHg/L
DLCO cor % pred: 91 %
DLCO cor: 16.55 ml/min/mmHg
DLCO unc % pred: 69 %
DLCO unc: 12.69 ml/min/mmHg
FEF 25-75 Post: 1.25 L/sec
FEF 25-75 Pre: 1.32 L/sec
FEF2575-%Change-Post: -4 %
FEF2575-%Pred-Post: 70 %
FEF2575-%Pred-Pre: 73 %
FEV1-%Change-Post: -2 %
FEV1-%Pred-Post: 64 %
FEV1-%Pred-Pre: 65 %
FEV1-Post: 1.34 L
FEV1-Pre: 1.37 L
FEV1FVC-%CHANGE-POST: 0 %
FEV1FVC-%Pred-Pre: 107 %
FEV6-%Change-Post: -2 %
FEV6-%Pred-Post: 62 %
FEV6-%Pred-Pre: 63 %
FEV6-Post: 1.64 L
FEV6-Pre: 1.67 L
FEV6FVC-%Change-Post: 0 %
FEV6FVC-%Pred-Post: 104 %
FEV6FVC-%Pred-Pre: 104 %
FVC-%Change-Post: -1 %
FVC-%Pred-Post: 59 %
FVC-%Pred-Pre: 60 %
FVC-Post: 1.65 L
FVC-Pre: 1.67 L
Post FEV1/FVC ratio: 81 %
Post FEV6/FVC ratio: 100 %
Pre FEV1/FVC ratio: 82 %
Pre FEV6/FVC Ratio: 100 %
RV % pred: 84 %
RV: 1.77 L
TLC % pred: 74 %
TLC: 3.53 L

## 2018-05-12 NOTE — Progress Notes (Signed)
PFT done today. 

## 2018-05-17 ENCOUNTER — Telehealth: Payer: Self-pay | Admitting: Pulmonary Disease

## 2018-05-17 NOTE — Telephone Encounter (Signed)
RA please advise on patients results for PFT. Thank you.

## 2018-05-18 NOTE — Telephone Encounter (Signed)
Spoke with pt. She is aware of results. Nothing further was needed.  

## 2018-05-18 NOTE — Telephone Encounter (Signed)
Intraparenchymal restriction consistent with interstitial lung disease -see result note  Consistent with inflammation/scar tissue buildup in the lungs

## 2018-05-24 ENCOUNTER — Ambulatory Visit (HOSPITAL_COMMUNITY): Payer: Medicare Other

## 2018-05-24 ENCOUNTER — Encounter (HOSPITAL_COMMUNITY): Payer: Medicare Other

## 2018-05-26 ENCOUNTER — Other Ambulatory Visit: Payer: Self-pay | Admitting: Vascular Surgery

## 2018-05-26 MED ORDER — CILOSTAZOL 100 MG PO TABS: 100.0000 mg | ORAL_TABLET | Freq: Two times a day (BID) | ORAL | 0 refills | Status: DC

## 2018-06-15 ENCOUNTER — Ambulatory Visit (HOSPITAL_COMMUNITY): Payer: Medicare Other | Attending: Pulmonary Disease

## 2018-06-15 ENCOUNTER — Inpatient Hospital Stay (HOSPITAL_COMMUNITY): Admission: RE | Admit: 2018-06-15 | Payer: Medicare Other | Source: Ambulatory Visit

## 2018-06-15 ENCOUNTER — Other Ambulatory Visit: Payer: Self-pay | Admitting: Endocrinology

## 2018-06-21 ENCOUNTER — Ambulatory Visit (INDEPENDENT_AMBULATORY_CARE_PROVIDER_SITE_OTHER): Payer: Medicare Other | Admitting: Pulmonary Disease

## 2018-06-21 ENCOUNTER — Telehealth: Payer: Self-pay | Admitting: Pulmonary Disease

## 2018-06-21 ENCOUNTER — Other Ambulatory Visit: Payer: Self-pay

## 2018-06-21 ENCOUNTER — Encounter: Payer: Self-pay | Admitting: Pulmonary Disease

## 2018-06-21 DIAGNOSIS — I5033 Acute on chronic diastolic (congestive) heart failure: Secondary | ICD-10-CM

## 2018-06-21 DIAGNOSIS — J40 Bronchitis, not specified as acute or chronic: Secondary | ICD-10-CM | POA: Diagnosis not present

## 2018-06-21 DIAGNOSIS — B370 Candidal stomatitis: Secondary | ICD-10-CM

## 2018-06-21 DIAGNOSIS — F17210 Nicotine dependence, cigarettes, uncomplicated: Secondary | ICD-10-CM

## 2018-06-21 DIAGNOSIS — F17200 Nicotine dependence, unspecified, uncomplicated: Secondary | ICD-10-CM

## 2018-06-21 DIAGNOSIS — J849 Interstitial pulmonary disease, unspecified: Secondary | ICD-10-CM | POA: Diagnosis not present

## 2018-06-21 MED ORDER — NYSTATIN 100000 UNIT/ML MT SUSP: 5.0000 mL | Freq: Four times a day (QID) | OROMUCOSAL | 0 refills | Status: DC

## 2018-06-21 MED ORDER — DOXYCYCLINE HYCLATE 100 MG PO TABS: 100.0000 mg | ORAL_TABLET | Freq: Two times a day (BID) | ORAL | 0 refills | Status: DC

## 2018-06-21 NOTE — Assessment & Plan Note (Signed)
Assessment: Current everyday smoker Has not set a quit date Smoking 5 to 6 cigarettes a day  Plan: You need to set a quit date You need to stop smoking

## 2018-06-21 NOTE — Telephone Encounter (Signed)
Called and spoke with Patient.  TP's recommendations given.  Patient scheduled a tele visit with Wyn Quaker, NP at 3:30pm this afternoon, 06/21/18.  Nothing further at this time.

## 2018-06-21 NOTE — Assessment & Plan Note (Signed)
Assessment: Cough with productive purulent sputum for the last 2 weeks Increased fatigue  Plan: We will empirically treat for bronchitis due to length of cough as well as purulent sputum production Doxycycline today

## 2018-06-21 NOTE — Telephone Encounter (Signed)
Can make for televisit with April Mccreedy NP this evening if she would like

## 2018-06-21 NOTE — Assessment & Plan Note (Signed)
Assessment: Patient reporting increased weight gain Patient also reporting increased lower extremity swelling  Plan: Patient to continue taking diuretics as prescribed Patient to follow-up with cardiology regarding increased weight gain

## 2018-06-21 NOTE — Assessment & Plan Note (Signed)
Plan: 65-month follow-up with our office with chest x-ray Patient needs to stop smoking Discussed again with patient bronchoscopy, patient declines at this time Can consider high-resolution CT scan in 6 months to continue to evaluate

## 2018-06-21 NOTE — Telephone Encounter (Signed)
Called pt from recall list for pt to come in for a visit. While on the phone pt states she doesn't feel well. Pt c/o cough with chest congestion with little mucus production, when expectorated it is yellow/brown in color x 2 weeks. Pt also c/o low energy. Pt denies SOB, CP/tightness, wheezing, f/c/s. Pt states she is not taking any inhalers nor neb meds (listed on med list). Pt c/o white patches in mouth and painful.   Will send APP of the day prior to making an appt for recs regarding the cough.   Also, pt also c/o BIL lower extremity edema, advised pt to contact her cardiologist regarding her swelling. Pt stated she would.

## 2018-06-21 NOTE — Patient Instructions (Addendum)
Doxycycline >>> 1 100 mg tablet every 12 hours for 7 days >>>take with food  >>>wear sunscreen   Nystatin 500,000 units suspension /100,000 units/mL >>>5 mL's every 6 hours for 7 days >>>Try to retain nystatin in mouth as long as possible  Keep scheduled follow-up with primary care this week  We recommend that you stop smoking.  >>>You need to set a quit date >>>If you have friends or family who smoke, let them know you are trying to quit and not to smoke around you or in your living environment  Smoking Cessation Resources:  1 800 QUIT NOW  >>> Patient to call this resource and utilize it to help support her quit smoking >>> Keep up your hard work with stopping smoking  You can also contact the [facility] >>>For smoking cessation classes call [phone]  We do not recommend using e-cigarettes as a form of stopping smoking  You can sign up for smoking cessation support texts and information:  >>>https://smokefree.gov/smokefreetxt     No follow-ups on file.   Coronavirus (COVID-19) Are you at risk?  Are you at risk for the Coronavirus (COVID-19)?  To be considered HIGH RISK for Coronavirus (COVID-19), you have to meet the following criteria:  . Traveled to [place], [place], [place], [place] or [place]; or in the [place] to [place], [place], [place], or [place]; and have fever, cough, and shortness of breath within the last 2 weeks of travel OR . Been in close contact with a person diagnosed with COVID-19 within the last 2 weeks and have fever, cough, and shortness of breath . IF YOU DO NOT MEET THESE CRITERIA, YOU ARE CONSIDERED LOW RISK FOR COVID-19.  What to do if you are HIGH RISK for COVID-19?  Marland Kitchen If you are having a medical emergency, call 911. . Seek medical care right away. Before you go to a doctor's office, urgent care or emergency department, call ahead and tell them about your recent travel, contact with someone diagnosed with  COVID-19, and your symptoms. You should receive instructions from your physician's office regarding next steps of care.  . When you arrive at healthcare provider, tell the healthcare staff immediately you have returned from visiting [place], [place], [place], [place] or [place]; or traveled in the [place] to [place], [place], [place], or [place]; in the last two weeks or you have been in close contact with a person diagnosed with COVID-19 in the last 2 weeks.   . Tell the health care staff about your symptoms: fever, cough and shortness of breath. . After you have been seen by a medical provider, you will be either: o Tested for (COVID-19) and discharged home on quarantine except to seek medical care if symptoms worsen, and asked to  - Stay home and avoid contact with others until you get your results (4-5 days)  - Avoid travel on public transportation if possible (such as bus, train, or airplane) or o Sent to the Emergency Department by EMS for evaluation, COVID-19 testing, and possible admission depending on your condition and test results.  What to do if you are LOW RISK for COVID-19?  Reduce your risk of any infection by using the same precautions used for avoiding the common cold or flu:  Marland Kitchen Wash your hands often with soap and warm water for at least 20 seconds.  If soap and water are not readily available, use an alcohol-based hand sanitizer with at least 60% alcohol.  Marland Kitchen  If coughing or sneezing, cover your mouth and nose by coughing or sneezing into the elbow areas of your shirt or coat, into a tissue or into your sleeve (not your hands). . Avoid shaking hands with others and consider head nods or verbal greetings only. . Avoid touching your eyes, nose, or mouth with unwashed hands.  . Avoid close contact with people who are sick. . Avoid places or events with large numbers of people in one location, like concerts or sporting events. . Carefully consider travel plans you have or  are making. . If you are planning any travel outside or inside the [place], visit the CDC's Travelers' Health webpage for the latest health notices. . If you have some symptoms but not all symptoms, continue to monitor at home and seek medical attention if your symptoms worsen. . If you are having a medical emergency, call 911.   [place] / e-Visit: [email or web address]         [facility]: [facility]: [phone]                   [facility]: [phone]           It is flu season:   >>> Best ways to protect herself from the flu: Receive the yearly flu vaccine, practice good hand hygiene washing with soap and also using hand sanitizer when available, eat a nutritious meals, get adequate rest, hydrate appropriately   Please contact the office if your symptoms worsen or you have concerns that you are not improving.   Thank you for choosing [facility] for your healthcare, and for allowing [place] to partner with you on your healthcare journey. I am thankful to be able to provide care to you today.   Wyn Quaker FNP-C    Acute Bronchitis, Adult Acute bronchitis is when air tubes (bronchi) in the lungs suddenly get swollen. The condition can make it hard to breathe. It can also cause these symptoms:  A cough.  Coughing up clear, yellow, or green mucus.  Wheezing.  Chest congestion.  Shortness of breath.  A fever.  Body aches.  Chills.  A sore throat. Follow these instructions at home:  Medicines  Take over-the-counter and prescription medicines only as told by your doctor.  If you were prescribed an antibiotic medicine, take it as told by your doctor. Do not stop taking the antibiotic even if you start to feel better. General instructions  Rest.  Drink enough fluids to keep your pee (urine) pale  yellow.  Avoid smoking and secondhand smoke. If you smoke and you need help quitting, ask your doctor. Quitting will help your lungs heal faster.  Use an inhaler, cool mist vaporizer, or humidifier as told by your doctor.  Keep all follow-up visits as told by your doctor. This is important. How is this prevented? To lower your risk of getting this condition again:  Wash your hands often with soap and water. If you cannot use soap and water, use hand sanitizer.  Avoid contact with people who have cold symptoms.  Try not to touch your hands to your mouth, nose, or eyes.  Make sure to get the flu shot every year. Contact a doctor if:  Your symptoms do not get better in 2 weeks. Get help right away if:  You cough up blood.  You have chest pain.  You have very bad shortness of  breath.  You become dehydrated.  You faint (pass out) or keep feeling like you are going to pass out.  You keep throwing up (vomiting).  You have a very bad headache.  Your fever or chills gets worse. This information is not intended to replace advice given to you by your health care provider. Make sure you discuss any questions you have with your health care provider. Document Released: 07/29/2007 Document Revised: 09/23/2016 Document Reviewed: 07/31/2015 Elsevier Interactive Patient Education  2019 [place], Adult  Oral thrush is an infection in your mouth and throat. It causes white patches on your tongue and in your mouth. Follow these instructions at home: Helping with soreness   To lessen your pain: ? Drink cold liquids, like water and iced tea. ? Eat frozen ice pops or frozen juices. ? Eat foods that are easy to swallow, like gelatin and ice cream. ? Drink from a straw if the patches in your mouth are painful. General instructions  Take or use over-the-counter and prescription medicines only as told by your doctor. Medicine for oral thrush may be something to  swallow, or it may be something to put on the infected area.  Eat plain yogurt that has live cultures in it. Read the label to make sure.  If you wear dentures: ? Take out your dentures before you go to bed. ? Brush them well. ? Soak them in a denture cleaner.  Rinse your mouth with warm salt-water many times a day. To make the salt-water mixture, completely dissolve 1/2-1 teaspoon of salt in 1 cup of warm water. Contact a doctor if:  Your problems are getting worse.  Your problems do not get better in less than 7 days with treatment.  Your infection is spreading. This may show as white patches on the skin outside of your mouth.  You are nursing your baby and you have redness and pain in the nipples. This information is not intended to replace advice given to you by your health care provider. Make sure you discuss any questions you have with your health care provider. Document Released: 05/06/2009 Document Revised: 11/04/2015 Document Reviewed: 11/04/2015 Elsevier Interactive Patient Education  2019 [place] Risks of Smoking Smoking cigarettes is very bad for your health. Tobacco smoke has over 200 known poisons in it. It contains the poisonous gases nitrogen oxide and carbon monoxide. There are over 60 chemicals in tobacco smoke that cause cancer. Smoking is difficult to quit because a chemical in tobacco, called nicotine, causes addiction or dependence. When you smoke and inhale, nicotine is absorbed rapidly into the bloodstream through your lungs. Both inhaled and non-inhaled nicotine may be addictive. What are the risks of cigarette smoke? Cigarette smokers have an increased risk of many serious medical problems, including:  Lung cancer.  Lung disease, such as pneumonia, bronchitis, and emphysema.  Chest pain (angina) and heart attack because the heart is not getting enough oxygen.  Heart disease and peripheral blood vessel disease.  High blood pressure  (hypertension).  Stroke.  Oral cancer, including cancer of the lip, mouth, or voice box.  Bladder cancer.  Pancreatic cancer.  Cervical cancer.  Pregnancy complications, including premature birth.  Stillbirths and smaller newborn babies, birth defects, and genetic damage to sperm.  Early menopause.  Lower estrogen level for women.  Infertility.  Facial wrinkles.  Blindness.  Increased risk of broken bones (fractures).  Senile dementia.  Stomach ulcers and internal bleeding.  Delayed wound healing and increased risk of complications during surgery.  Even smoking lightly shortens your life expectancy by several years. Because of secondhand smoke exposure, children of smokers have an increased risk of the following:  Sudden infant death syndrome (SIDS).  Respiratory infections.  Lung cancer.  Heart disease.  Ear infections. What are the benefits of quitting? There are many health benefits of quitting smoking. Here are some of them:  Within days of quitting smoking, your risk of having a heart attack decreases, your blood flow improves, and your lung capacity improves. Blood pressure, pulse rate, and breathing patterns start returning to normal soon after quitting.  Within months, your lungs may clear up completely.  Quitting for 10 years reduces your risk of developing lung cancer and heart disease to almost that of a nonsmoker.  People who quit may see an improvement in their overall quality of life. How do I quit smoking?     Smoking is an addiction with both physical and psychological effects, and longtime habits can be hard to change. Your health care provider can recommend:  Programs and community resources, which may include group support, education, or talk therapy.  Prescription medicines to help reduce cravings.  Nicotine replacement products, such as patches, gum, and nasal sprays. Use these products only as directed. Do not replace cigarette  smoking with electronic cigarettes, which are commonly called e-cigarettes. The safety of e-cigarettes is not known, and some may contain harmful chemicals.  A combination of two or more of these methods. Where to find more information  American Lung Association: www.lung.org  American Cancer Society: www.cancer.org Summary  Smoking cigarettes is very bad for your health. Cigarette smokers have an increased risk of many serious medical problems, including several cancers, heart disease, and stroke.  Smoking is an addiction with both physical and psychological effects, and longtime habits can be hard to change.  By stopping right away, you can greatly reduce the risk of medical problems for you and your family.  To help you quit smoking, your health care provider can recommend programs, community resources, prescription medicines, and nicotine replacement products such as patches, gum, and nasal sprays. This information is not intended to replace advice given to you by your health care provider. Make sure you discuss any questions you have with your health care provider. Document Released: 03/19/2004 Document Revised: 05/13/2017 Document Reviewed: 02/14/2016 Elsevier Interactive Patient Education  2019 [employer].

## 2018-06-21 NOTE — Progress Notes (Signed)
Virtual Visit via Telephone Note  I connected with April Branch on 06/21/18 at  3:30 PM EDT by telephone and verified that I am speaking with the correct person using two identifiers.   I discussed the limitations, risks, security and privacy concerns of performing an evaluation and management service by telephone and the availability of in person appointments. I also discussed with the patient that there may be a patient responsible charge related to this service. The patient expressed understanding and agreed to proceed.   History of Present Illness: 73 year old female current every day smoker followed in our office by April Branch for interstitial lung disease  Patient consented to consult via telephone: Yes People present and their role in pt care: Patient  Chief complaint: Cough / Fatigue  73 year old female completing tele-visit with our office today because she has acute worsening symptoms of fatigue as well as 2 weeks of productive cough with discolored yellow mucus.  Patient also reporting that she feels that she is retaining some fluid in her lower extremities.  Patient reports adherence to her Lasix as prescribed by cardiology.   Patient also reporting that she has had white patchy blisters on the inside of her mouth.  She reports she has difficulty swallowing.  Patient has upcoming appointment with primary care in 2 days.  Patient continues to smoke 5 to 6 cigarettes a day.  Patient is working to stop smoking but has not set a quit date.  See smoking cessation information listed below.    Smoking assessment and cessation counseling  Patient currently smoking: 5 or 6 cigarettes a day  I have advised the patient to quit/stop smoking as soon as possible due to high risk for multiple medical problems.  It will also be very difficult for [place] to manage patient's  respiratory symptoms and status if we continue to expose her lungs to a known irritant.  We do not advise e-cigarettes as  a form of stopping smoking.  Patient is or is not willing to quit smoking.  I have advised the patient that we can assist and have options of nicotine replacement therapy, provided smoking cessation education today, provided smoking cessation counseling, and provided cessation resources.  Follow-up next office visit office visit for assessment of smoking cessation.  Smoking cessation counseling advised for: 4 min       Observations/Objective:  2/10/20Perfusion scan was negative for PE. 2/9/20High-resolution CT chest multilobar pneumonia rather than ILD 03/2018 ANA , RA , ANCA neg  04/03/2018-CT chest high resolution-appearance of chest is favored reflect multilobar bronchopneumonia than underlying ILD, if there is persistent clinical concern for ILD repeat high-resolution CT scan in 6 to 12 months  05/12/2018-pulmonary function test - FVC 1.67 (60% predicted), postbronchodilator ratio 81, postbronchodilator FEV1 1.34 (64% predicted), no bronchodilator response, DLCO 69 >>>Intraparenchymal restriction consistent with interstitial lung disease  03/17/2018-echocardiogram-LV ejection fraction 60 to 65%   No results found for: NITRICOXIDE  Assessment and Plan:  Bronchitis Assessment: Cough with productive purulent sputum for the last 2 weeks Increased fatigue  Plan: We will empirically treat for bronchitis due to length of cough as well as purulent sputum production Doxycycline today  Oral thrush Assessment: Suspect the patient may have oral thrush based off of symptoms she is describing today Difficult to fully assess telephonically  Plan:  Nystatin oral rinse Can further be evaluated when seen primary care provider later on this week  ILD (interstitial lung disease) ([place]) Plan: 68-month follow-up with our office with chest x-ray  Patient needs to stop smoking Discussed again with patient bronchoscopy, patient declines at this time Can consider high-resolution CT scan in  6 months to continue to evaluate  Acute on chronic diastolic CHF (congestive heart failure) (HCC) Assessment: Patient reporting increased weight gain Patient also reporting increased lower extremity swelling  Plan: Patient to continue taking diuretics as prescribed Patient to follow-up with cardiology regarding increased weight gain  DISORDER, TOBACCO USE Assessment: Current everyday smoker Has not set a quit date Smoking 5 to 6 cigarettes a day  Plan: You need to set a quit date You need to stop smoking     Follow Up Instructions:  Return in about 6 weeks (around 08/02/2018), or if symptoms worsen or fail to improve, for Follow up with April Branch, Follow up with April Branch.    I discussed the assessment and treatment plan with the patient. The patient was provided an opportunity to ask questions and all were answered. The patient agreed with the plan and demonstrated an understanding of the instructions.   The patient was advised to call back or seek an in-person evaluation if the symptoms worsen or if the condition fails to improve as anticipated.  I provided 24 minutes of non-face-to-face time during this encounter.   April Rinne, NP

## 2018-06-21 NOTE — Assessment & Plan Note (Signed)
Assessment: Suspect the patient may have oral thrush based off of symptoms she is describing today Difficult to fully assess telephonically  Plan:  Nystatin oral rinse Can further be evaluated when seen primary care provider later on this week

## 2018-07-04 ENCOUNTER — Other Ambulatory Visit: Payer: Self-pay | Admitting: Vascular Surgery

## 2018-07-11 ENCOUNTER — Ambulatory Visit: Payer: Medicare Other | Admitting: Pulmonary Disease

## 2018-07-16 ENCOUNTER — Other Ambulatory Visit: Payer: Self-pay | Admitting: Endocrinology

## 2018-07-21 ENCOUNTER — Ambulatory Visit: Payer: Medicare Other | Admitting: Endocrinology

## 2018-07-21 ENCOUNTER — Telehealth: Payer: Self-pay | Admitting: Endocrinology

## 2018-07-21 ENCOUNTER — Other Ambulatory Visit: Payer: Self-pay

## 2018-07-21 ENCOUNTER — Encounter: Payer: Self-pay | Admitting: Endocrinology

## 2018-07-21 ENCOUNTER — Telehealth: Payer: Self-pay | Admitting: Pulmonary Disease

## 2018-07-21 VITALS — BP 122/62 | HR 95 | Ht 61.0 in | Wt 170.8 lb

## 2018-07-21 DIAGNOSIS — E1142 Type 2 diabetes mellitus with diabetic polyneuropathy: Secondary | ICD-10-CM

## 2018-07-21 DIAGNOSIS — E1165 Type 2 diabetes mellitus with hyperglycemia: Secondary | ICD-10-CM

## 2018-07-21 DIAGNOSIS — Z794 Long term (current) use of insulin: Secondary | ICD-10-CM | POA: Diagnosis not present

## 2018-07-21 DIAGNOSIS — E038 Other specified hypothyroidism: Secondary | ICD-10-CM | POA: Diagnosis not present

## 2018-07-21 LAB — COMPREHENSIVE METABOLIC PANEL
ALT: 11 U/L (ref 0–35)
AST: 10 U/L (ref 0–37)
Albumin: 3.8 g/dL (ref 3.5–5.2)
Alkaline Phosphatase: 111 U/L (ref 39–117)
BUN: 26 mg/dL — ABNORMAL HIGH (ref 6–23)
CO2: 30 mEq/L (ref 19–32)
Calcium: 9 mg/dL (ref 8.4–10.5)
Chloride: 98 mEq/L (ref 96–112)
Creatinine, Ser: 1.94 mg/dL — ABNORMAL HIGH (ref 0.40–1.20)
GFR: 25.44 mL/min — ABNORMAL LOW (ref >60.00)
Glucose, Bld: 126 mg/dL — ABNORMAL HIGH (ref 70–99)
Potassium: 3.6 mEq/L (ref 3.5–5.1)
Sodium: 139 mEq/L (ref 135–145)
Total Bilirubin: 0.3 mg/dL (ref 0.2–1.2)
Total Protein: 7 g/dL (ref 6.0–8.3)

## 2018-07-21 LAB — LIPID PANEL
Cholesterol: 200 mg/dL (ref 0–200)
HDL: 50.8 mg/dL (ref >39.00)
NonHDL: 148.74
Total CHOL/HDL Ratio: 4
Triglycerides: 322 mg/dL — ABNORMAL HIGH (ref 0.0–149.0)
VLDL: 64.4 mg/dL — ABNORMAL HIGH (ref 0.0–40.0)

## 2018-07-21 LAB — POCT GLYCOSYLATED HEMOGLOBIN (HGB A1C): Hemoglobin A1C: 9.9 % — AB (ref 4.0–5.6)

## 2018-07-21 LAB — TSH: TSH: 4.59 u[IU]/mL — ABNORMAL HIGH (ref 0.35–4.50)

## 2018-07-21 LAB — T4, FREE: Free T4: 1.07 ng/dL (ref 0.60–1.60)

## 2018-07-21 LAB — LDL CHOLESTEROL, DIRECT: Direct LDL: 99 mg/dL

## 2018-07-21 NOTE — Telephone Encounter (Signed)
Please let patient know that I have discussed her prednisone dose with pulmonologist and he agrees to have her reduce the dose to half tablet of the 10 mg daily in the morning Pulmonologist will plan to see her in 2 weeks If her sugars start getting low she will need to call us

## 2018-07-21 NOTE — Telephone Encounter (Signed)
She is on 10 mg of prednisone. Sugars over 500. Please asked her to drop to 5 mg and schedule follow-up appointment with [place] in 2 weeks

## 2018-07-21 NOTE — Telephone Encounter (Signed)
Called patient informed to adjust prednisone to 5 mg. F/u appt has been rescheduled to 6/11. nothiing further needed.

## 2018-07-21 NOTE — Progress Notes (Signed)
Patient ID: April Branch, female   DOB: 22-Dec-1945, 73 y.o.   MRN: [id number]           Reason for Appointment: Follow-up  for Type 2 Diabetes  Referring physician: Pattricia Boss  History of Present Illness:          Date of diagnosis of type 2 diabetes mellitus: 1980s       Previous history:    She thinks she was treated with metformin for some time before she went on insulin in ?  2002   She has mostly been treated with Lantus and rapid acting insulin such as Apidra or Novolog.   She thinks that Apidra worked fairly fast but insurance did not cover this, generally has had poor control   On her initial consultation she was switched from Lantus to [employer] because of blood sugars mostly over 300  Was also started on metformin ER 750 mg, 2 tablets daily initially and  in 3/17 she was started on Invokana 100 mg daily because of  poor control  Recent history:   INSULIN regimen: OMNIPOD pump using Humalog U-100 insulin  Current settings: Basal rate: Midnight = 1.35, 8 a.m. = 2.0 units/h, 6 PM = 1.8 Bolus presets 4 units breakfast, 4 units lunch and 4-6 at dinnertime Recent basal daily dose 41.6 units  Her A1c had improved previously but now up to 9.9  Current blood sugar patterns and problems identified:  Her blood sugars are out of control partly from her being on prednisone the last couple of months  She thinks that because of stress, use of prednisone she is eating more sweets like cherry icee and also not eating balanced meals are regular meals  She forgets to bolus for her meals till after eating sometimes and sometimes not at all  Most of her blood sugars are significantly higher and get higher from midday till evening  Yesterday her blood sugar was over 600  Also when she does a correction bolus her blood sugar did not appear to be coming down adequately also  She has also gained 6 pounds  She does not get any hypoglycemia although blood sugar was down to 75 this morning  which is unusual  Previously was on Invokana but this has not been continued because of renal dysfunction times and low blood pressure  Oral hypoglycemic drugs the patient is taking are: None  Side effects from medications have been: none  Compliance with the medical regimen: Poor   Glucose monitoring:  done 2-3 times a day         Glucometer:  Accu-Chek currently    Blood Glucose readings by monitor download:   PRE-MEAL Fasting Lunch Dinner Bedtime Overall  Glucose range:  75-315  77-333  290-580  156-130   Mean/median:      336   Readings previously: PRE-MEAL Fasting Lunch Dinner Bedtime Overall  Glucose range:  90-329   146-263    Mean/median:  142  189  210   200   POST-MEAL PC Breakfast PC Lunch  8 PM +  Glucose range:    158-332  Mean/median:    233   Self-care:    Typical meal intake: Breakfast is variable at 11 AM, frequently no lunch, dinner 7 pm              Dietician visit, most recent: never               Exercise: Unable to do much physical activity  Weight history:  Highest previous weight 199   Wt Readings from Last 3 Encounters:  07/21/18 170 lb 12.8 oz (77.5 kg)  05/05/18 164 lb 9.6 oz (74.7 kg)  04/18/18 170 lb (77.1 kg)    Glycemic control:   Lab Results  Component Value Date   HGBA1C 7.9 (H) 04/03/2018   HGBA1C 7.5 (H) 03/17/2018   HGBA1C 7.8 (H) 01/12/2018   Lab Results  Component Value Date   MICROALBUR 1.8 08/17/2017   LDLCALC 64 03/17/2018   CREATININE 1.53 (H) 04/07/2018    No visits with results within 1 Week(s) from this visit.  Latest known visit with results is:  Clinical Support on 05/12/2018  Component Date Value Ref Range Status   FVC-Pre 05/12/2018 1.67  L Preliminary   FVC-%Pred-Pre 05/12/2018 60  % Preliminary   FVC-Post 05/12/2018 1.65  L Preliminary   FVC-%Pred-Post 05/12/2018 59  % Preliminary   FVC-%Change-Post 05/12/2018 -1  % Preliminary   FEV1-Pre 05/12/2018 1.37  L Preliminary   FEV1-%Pred-Pre  05/12/2018 65  % Preliminary   FEV1-Post 05/12/2018 1.34  L Preliminary   FEV1-%Pred-Post 05/12/2018 64  % Preliminary   FEV1-%Change-Post 05/12/2018 -2  % Preliminary   FEV6-Pre 05/12/2018 1.67  L Preliminary   FEV6-%Pred-Pre 05/12/2018 63  % Preliminary   FEV6-Post 05/12/2018 1.64  L Preliminary   FEV6-%Pred-Post 05/12/2018 62  % Preliminary   FEV6-%Change-Post 05/12/2018 -2  % Preliminary   Pre FEV1/FVC ratio 05/12/2018 82  % Preliminary   FEV1FVC-%Pred-Pre 05/12/2018 107  % Preliminary   Post FEV1/FVC ratio 05/12/2018 81  % Preliminary   FEV1FVC-%Change-Post 05/12/2018 0  % Preliminary   Pre FEV6/FVC Ratio 05/12/2018 100  % Preliminary   FEV6FVC-%Pred-Pre 05/12/2018 104  % Preliminary   Post FEV6/FVC ratio 05/12/2018 100  % Preliminary   FEV6FVC-%Pred-Post 05/12/2018 104  % Preliminary   FEV6FVC-%Change-Post 05/12/2018 0  % Preliminary   FEF 25-75 Pre 05/12/2018 1.32  L/sec Preliminary   FEF2575-%Pred-Pre 05/12/2018 73  % Preliminary   FEF 25-75 Post 05/12/2018 1.25  L/sec Preliminary   FEF2575-%Pred-Post 05/12/2018 70  % Preliminary   FEF2575-%Change-Post 05/12/2018 -4  % Preliminary   RV 05/12/2018 1.77  L Preliminary   RV % pred 05/12/2018 84  % Preliminary   TLC 05/12/2018 3.53  L Preliminary   TLC % pred 05/12/2018 74  % Preliminary   DLCO unc 05/12/2018 12.69  ml/min/mmHg Preliminary   DLCO unc % pred 05/12/2018 69  % Preliminary   DLCO cor 05/12/2018 16.55  ml/min/mmHg Preliminary   DLCO cor % pred 05/12/2018 91  % Preliminary   DL/VA 05/12/2018 5.31  ml/min/mmHg/L Preliminary   DL/VA % pred 05/12/2018 126  % Preliminary    OTHER active problems: See review of systems    Allergies as of 07/21/2018   No Known Allergies     Medication List       Accurate as of Jul 21, 2018  2:29 PM. If you have any questions, ask your nurse or doctor.        STOP taking these medications   doxycycline 100 MG tablet Commonly known as:   VIBRA-TABS Stopped by:  Elayne Snare, MD   tamsulosin 0.4 MG Caps capsule Commonly known as:  FLOMAX Stopped by:  Elayne Snare, MD     TAKE these medications   Accu-Chek FastClix Lancets Misc Use 4 per day to check blood sugar dx code E11.9   albuterol 108 (90 Base) MCG/ACT inhaler Commonly known as:  VENTOLIN  HFA Inhale 1-2 puffs into the lungs every 6 (six) hours as needed for wheezing or shortness of breath.   aspirin 325 MG EC tablet Take 1 tablet (325 mg total) by mouth daily.   cilostazol 100 MG tablet Commonly known as:  PLETAL TAKE 1 TABLET BY MOUTH TWICE DAILY BEFORE MEALS   DULoxetine 60 MG capsule Commonly known as:  CYMBALTA TAKE 1 CAPSULE BY MOUTH ONCE DAILY   ferrous sulfate 325 (65 FE) MG tablet Take 325 mg by mouth daily with breakfast.   fludrocortisone 0.1 MG tablet Commonly known as:  FLORINEF TAKE 1 TABLET BY MOUTH 3 TIMES A WEEK   fluticasone furoate-vilanterol 100-25 MCG/INH Aepb Commonly known as:  BREO ELLIPTA Inhale 1 puff into the lungs daily.   furosemide 20 MG tablet Commonly known as:  Lasix Take 1 tablet (20 mg total) by mouth daily. What changed:  how much to take   gabapentin 300 MG capsule Commonly known as:  NEURONTIN TAKE 1 CAPSULE BY MOUTH TWICE DAILY   glucose blood test strip Commonly known as:  Contour Next Test Use to test blood sugar 3 times daily E11.9   HYDROcodone-acetaminophen 10-325 MG tablet Commonly known as:  NORCO Take 1 tablet by mouth 3 (three) times daily as needed (for pain). Follow up with PCP if refills needed   hydrOXYzine 25 MG tablet Commonly known as:  ATARAX/VISTARIL Take 1 tablet (25 mg total) by mouth every 6 (six) hours. What changed:  when to take this   insulin lispro 100 UNIT/ML injection Commonly known as:  HumaLOG Inject 0.7 mLs (70 Units total) into the skin See admin instructions. Use 70 units daily in insulin pump   Insulin Pen Needle 32G X 4 MM Misc Commonly known as:  BD Pen Needle  Nano U/F Use to inject insulin   ipratropium-albuterol 0.5-2.5 (3) MG/3ML Soln Commonly known as:  DUONEB Take 3 mLs by nebulization every 6 (six) hours as needed.   lansoprazole 30 MG capsule Commonly known as:  PREVACID Take 1 capsule (30 mg total) by mouth daily.   levothyroxine 150 MCG tablet Commonly known as:  SYNTHROID TAKE 1 TABLET BY MOUTH ONCE DAILY What changed:  Another medication with the same name was removed. Continue taking this medication, and follow the directions you see here. Changed by:  Elayne Snare, MD   metoprolol succinate 25 MG 24 hr tablet Commonly known as:  TOPROL-XL TAKE 1 TABLET BY MOUTH EVERY DAY   nortriptyline 75 MG capsule Commonly known as:  PAMELOR Take 75 mg by mouth at bedtime.   nystatin 100000 UNIT/ML suspension Commonly known as:  MYCOSTATIN Take 5 mLs (500,000 Units total) by mouth 4 (four) times daily.   OXYGEN Inhale 1 L into the lungs as needed (shortness of breath).   potassium chloride 10 MEQ tablet Commonly known as:  K-DUR TAKE 1 TABLET BY MOUTH TWICE DAILY   predniSONE 10 MG tablet Commonly known as:  DELTASONE Take one tablet by mouth daily   QUEtiapine 100 MG tablet Commonly known as:  SEROQUEL Take 100 mg by mouth at bedtime.   tolterodine 4 MG 24 hr capsule Commonly known as:  DETROL LA Take 4 mg by mouth daily.   triamcinolone cream 0.1 % Commonly known as:  KENALOG Apply 1 application topically 2 (two) times daily.   Vitamin D (Ergocalciferol) 1.25 MG (50000 UT) Caps capsule Commonly known as:  DRISDOL Take 50,000 Units by mouth every Monday.  Allergies: No Known Allergies  Past Medical History:  Diagnosis Date   Anemia    Anxiety    Arthritis    CAD (coronary artery disease) 2016   non-obstructive at cath   Colon polyps 09/11/2010   Tubular adenoma   COPD 12/14/2008   PATIENT DENIES     Depression    Diabetic coma ([place]) Feb. 2014   Fall at home 2016   x 2 in January 2016,  Left knee   Fibromyalgia    GERD 07/20/2006   Headache(784.0)    History of transient ischemic attack (TIA)    HPV (human papilloma virus) infection    Hyperlipidemia    Hypertension    Hypothyroidism    Insulin dependent type 2 diabetes mellitus, controlled ([place]) 1989   Lumbar disc disease    Neuromuscular disorder ([place])    PERIPHERAL NEUROPATHY   Peripheral vascular disease ([place])    Personal history of malignant neoplasm of kidney(V10.52) 12/14/2008   Laparoscopic biopsy and cryoablation 7/08 Dr. Vernie Shanks    Renal disorder    chronic kidney disease    Stroke [facility])    3 MINI STROKES  RT SIDED WEAKNESS   Vertigo     Past Surgical History:  Procedure Laterality Date   ABDOMINAL AORTAGRAM N/A 08/21/2011   Procedure: ABDOMINAL Maxcine Ham;  Surgeon: Elam Dutch, MD;  Location: [facility] CATH LAB;  Service: Cardiovascular;  Laterality: N/A;   ABDOMINAL AORTAGRAM N/A 03/16/2014   Procedure: ABDOMINAL Maxcine Ham;  Surgeon: Elam Dutch, MD;  Location: [facility] CATH LAB;  Service: Cardiovascular;  Laterality: N/A;   ABDOMINAL HYSTERECTOMY     APPENDECTOMY     BLADDER SUSPENSION     CARDIAC CATHETERIZATION     CHOLECYSTECTOMY OPEN     ESOPHAGOGASTRODUODENOSCOPY (EGD) WITH PROPOFOL N/A 05/16/2014   Procedure: ESOPHAGOGASTRODUODENOSCOPY (EGD) WITH PROPOFOL with Balloon dilation;  Surgeon: Milus Banister, MD;  Location: [place];  Service: Endoscopy;  Laterality: N/A;   ESOPHAGOGASTRODUODENOSCOPY (EGD) WITH PROPOFOL N/A 09/02/2015   Procedure: ESOPHAGOGASTRODUODENOSCOPY (EGD) WITH PROPOFOL;  Surgeon: Doran Stabler, MD;  Location: [place];  Service: Gastroenterology;  Laterality: N/A;   FEMORAL-POPLITEAL BYPASS GRAFT  10/12/2011   Procedure: BYPASS GRAFT FEMORAL-POPLITEAL ARTERY;  Surgeon: Elam Dutch, MD;  Location: [facility] OR;  Service: Vascular;  Laterality: Left;  Left Femoral-Popliteal Bypass Graft using 66mm x 80cm Propaten Graft with intraop arteriogram  times one.   FEMORAL-POPLITEAL BYPASS GRAFT Left 04/17/2014   Procedure: REDO LEFT FEMORAL-POPLITEAL ARTERY BYPASS USING GORE PROPATEN 44mmx80cm GRAFT;  Surgeon: Elam Dutch, MD;  Location: [place];  Service: Vascular;  Laterality: Left;   FOOT SURGERY Left    "bone spurs"   INTRAOPERATIVE ARTERIOGRAM  10/12/2011   Procedure: INTRA OPERATIVE ARTERIOGRAM;  Surgeon: Elam Dutch, MD;  Location: [facility] OR;  Service: Vascular;  Laterality: Left;   KNEE ARTHROSCOPY Bilateral    "cartilage"   Laparascopic cryoablation of left kidney  08/2006   Dr. Gladis Riffle for renal cell cancer   LEFT HEART CATHETERIZATION WITH CORONARY ANGIOGRAM N/A 05/11/2011   Procedure: LEFT HEART CATHETERIZATION WITH CORONARY ANGIOGRAM;  Surgeon: Jolaine Artist, MD;  Location: [facility] CATH LAB;  Service: Cardiovascular;  Laterality: N/A;   LUNG SURGERY Right    lung nodule removed from the right side   PR VEIN BYPASS GRAFT,AORTO-FEM-POP  05/27/2010   SHOULDER ARTHROSCOPY Bilateral    'Spurs"   TUBAL LIGATION      Family History  Problem Relation Age of Onset  Heart disease Mother    Lung cancer Mother    Cancer Mother        Lung   Heart disease Father    Lung cancer Father    Cancer Father        Lung   Heart disease Sister        CABG- Open Heart    Social History:  reports that she has been smoking cigarettes. She has a 75.00 pack-year smoking history. She has never used smokeless tobacco. She reports that she does not drink alcohol or use drugs.    Review of Systems     DEPRESSION: She is treated with Cymbalta along with nortriptyline and nortriptyline was prescribed by PCP Also takes this for neuropathy  Lipid history:  She has good control with taking pravastatin 20 mg but has high triglycerides    Lab Results  Component Value Date   CHOL 124 03/17/2018   HDL 47 03/17/2018   LDLCALC 64 03/17/2018   LDLDIRECT 87.0 09/17/2017   TRIG 66 03/17/2018   CHOLHDL 2.6 03/17/2018              Neurological:    Has no numbness,but does have pains , burning and tingling in feet.   Symptoms had improved with  Cymbalta  60 mg but is complaining about symptoms again She was told by her PCP not to take gabapentin and was only given 100 mg which was not helping  She is being treated with nortriptyline by PCP   She has had  peripheral vascular disease and treated with Pletal from vascular surgeon  Long history of thyroid disease, Has had post ablative hypothyroidism, on 125 dosage for 3 weeks Although she was supposed to be on 150 mcg nephrologist changed her down to 125 next month but no records are available    Lab Results  Component Value Date   TSH 0.688 04/03/2018    Last  Foot exam findings: Monofilament sensation is reduced across most of the toes and distal plantar surfaces Absent pedal pulses  DIZZINESS: She says that her lightheadedness is better with starting Florinef 3 times a week when her blood pressure was 90 systolic standing up However she still has episodes of dizziness when getting up  Pressure appears to be relatively higher now and also has a low potassium She thinks she is taking a potassium supplement daily but no prescription has been given for a while  Lab Results  Component Value Date   K 4.2 04/07/2018           Physical Examination:  BP 122/62 (BP Location: Left Arm, Patient Position: Sitting, Cuff Size: Normal)    Pulse 95    Ht 5\' 1"  (1.549 m)    Wt 170 lb 12.8 oz (77.5 kg)    SpO2 97%    BMI 32.27 kg/m     ASSESSMENT:   Diabetes type 2, on Omnipod insulin pump  See history of present illness for detailed discussion of current management, blood sugar patterns and problems identified  Her A1c is last 7.8 and previously frequently over 9%  Although she is doing the OmniPod pump and is able to use it as directed she is not checking her blood sugars with brand-name meter Also not checking readings after meals As discussed above  she blood sugar appears to be mostly high during the day and only normal or close to normal fasting when she wakes up Not clear if some of her high readings  are from inadequate boluses for variable diet She is also having some technical issues with her pump  ORTHOSTATIC hypotension: Likely she has autonomic neuropathy with orthostatic hypotension without any treatment currently  HYPOTHYROIDISM: She will need to have her thyroid levels checked to consider adjustment of her dose, and currently her Synthroid dosage is 150 and last TSH was low normal  PLAN:    Discussed trying to keep reminders for her to check her sugars 4 times a day Regardless of whether she is eating or not she will need to make corrections for high readings as long as she is on prednisone She will cut back on sweets and desserts She will try to bolus with her meals consistently even if she boluses right after eating  She will increase her basal rate between 8 AM up to 12 noon up to 2.1 for now and 12 noon and 6 PM up to 2.5 and from 10 PM = 2.0 Sensitivity will be 1: 16 instead of 75 Also needs to start using the Contour meter and a prior authorization will be done to get this for her  We will send a message to her pulmonologist to see if her prednisone can be reduced or taper  HYPOKALEMIA: Will need follow-up labs   THYROID: Labs to be checked today  There are no Patient Instructions on file for this visit.  Counseling time on subjects discussed in assessment and plan sections is over 50% of today's 25 minute visit     Elayne Snare 07/21/2018, 2:29 PM   Note: This office note was prepared with Dragon voice recognition system technology. Any transcriptional errors that result from this process are unintentional.

## 2018-07-22 NOTE — Telephone Encounter (Signed)
Attempted to call pt and did not get an answer. Left voicemail for pt requesting a callback.

## 2018-07-26 NOTE — Telephone Encounter (Signed)
Called pt and gave her MD message. Pt verbalized understanding. She was also advised to call and schedule a f/u appt with pulmonologist if she has not already done so.

## 2018-07-28 ENCOUNTER — Other Ambulatory Visit: Payer: Self-pay | Admitting: Pulmonary Disease

## 2018-07-28 ENCOUNTER — Telehealth: Payer: Self-pay

## 2018-07-28 NOTE — Telephone Encounter (Signed)
PA initiated via [email or web address] for [employer] next test strips. Key: [id number]   PA Case ID: [id number]

## 2018-08-01 NOTE — Telephone Encounter (Signed)
Received fax from [employer] stating that the pt has been approved for [employer] Next test strips under Part B benefits through 02/23/2019.

## 2018-08-03 NOTE — Progress Notes (Signed)
Virtual Visit via Video Note  I connected with April Branch on 08/04/18 at 11:30 AM EDT by a video enabled telemedicine application and verified that I am speaking with the correct person using two identifiers.  Location: Patient: Home Provider: Office - [facility] - [address], [address], [place], [place] [address]  I discussed the limitations of evaluation and management by telemedicine and the availability of in person appointments. The patient expressed understanding and agreed to proceed. I also discussed with the patient that there may be a patient responsible charge related to this service. The patient expressed understanding and agreed to proceed.  Patient consented to consult via telephone: Yes People present and their role in pt care: Pt   History of Present Illness: 73 year old female current every day smoker followed in our office by Dr. Elsworth Soho for interstitial lung disease  PMH: GERD, IBS, anxiety, hypertension, chronic pain syndrome, CKD stage III Smoker/ Smoking History: Current Everyday Smoker. 7 cigarettes a day.   Maintenance:  Prednisone 5mg   Pt of: Dr. Elsworth Soho  Chief complaint: Body aches, chills, 2-week follow-up from telephone note   73 year old female current every day smoker (smoking 7 cigarettes a day) followed in our office for interstitial lung disease.  Patient had a telephone note about 2 weeks ago where Dr. Elsworth Soho was addressing her significantly elevated blood sugars.  She has been working with Dr. [name] regarding this.  Dr. Elsworth Soho decreased her prednisone from 10 mg daily to 5 mg daily.  Patient reporting that her blood sugars are still significantly elevated.  She reports she had blood sugars yesterday that were 400-500.  She reports her fasting blood sugar this morning was 86.  She is working with Dr. [name] regarding this.  Patient also is taking a fluid pill she is taking Lasix 60 mg daily.  She reports that she is not urinating often and that her  weights have actually gone up some 272 pounds today.  She reports that she has lower extremity swelling.  She is planning on seeing her kidney doctor tomorrow.  She also has a cardiologist Dr. Johnsie Cancel.  Over the past 1 to 2 weeks the patient reports that she has had body aches, hip pain, headaches, cold chills, low-grade temperature 3 nights ago of 99.9, cough with productive yellow mucus.  She denies wheezing.  She reports that she has no known sick contacts.  She does still go to the grocery store as well as to roses but she wears a mask and gloves.  She also goes to [place] to get crushed ice.  Patient denies worsened fatigue but come to needs to say that she "hurts" and it is harder for her to take care of her 8 dogs over the past couple of days.   Smoking assessment and cessation counseling  Patient currently smoking: 7 cigarettes daily I have advised the patient to quit/stop smoking as soon as possible due to high risk for multiple medical problems.  It will also be very difficult for [place] to manage patient's  respiratory symptoms and status if we continue to expose her lungs to a known irritant.  We do not advise e-cigarettes as a form of stopping smoking.  Patient is not willing to quit smoking at this time.  I have advised the patient that we can assist and have options of nicotine replacement therapy, provided smoking cessation education today, provided smoking cessation counseling, and provided cessation resources.  Follow-up next office visit office visit for assessment  of smoking cessation.  Smoking cessation counseling advised for: 4 min    Observations/Objective:  08/04/2018 - weight - 172lbs  2/10/20Perfusion scan was negative for PE. 2/9/20High-resolution CT chest multilobar pneumonia rather than ILD 03/2018 ANA , RA , ANCA neg  04/03/2018-CT chest high resolution-appearance of chest is favored reflect multilobar bronchopneumonia than underlying ILD, if there is  persistent clinical concern for ILD repeat high-resolution CT scan in 6 to 12 months  05/12/2018-pulmonary function test - FVC 1.67 (60% predicted), postbronchodilator ratio 81, postbronchodilator FEV1 1.34 (64% predicted), no bronchodilator response, DLCO 69 >>>Intraparenchymal restriction consistent with interstitial lung disease  03/17/2018-echocardiogram-LV ejection fraction 60 to 65%    Assessment and Plan:  Suspected Covid-19 Virus Infection Assessment: 1 to 2 weeks of body aches, chills, increased cough, purulent sputum, bouts of fatigue Patient reporting low-grade fever on 08/01/2018 F 99 9 Patient denies any recent sick contacts  Plan: With patient's comorbidities, age: 73 years old, and current everyday smoker status as well as acute symptoms she has been having we will send her to complete outpatient SARS-CoV-2 testing  I have notified her to also contact her kidney doctor who she is planning on seeing tomorrow to let them know that she is going to be tested for COVID so she will not be able to complete in an office visit and this will need to be a tele-visit.  Tentative follow-up date with our office on 08/15/2018.  This will need to be rescheduled if patient is in fact positive for COVID.  This will need to be converted to a tele-visit.  Patient does not have a smart phone or computer at home so she is unable to complete a MyChart video visit.  CKD (chronic kidney disease), stage III (HCC) Plan: Stable weights Taking 60 mg of Lasix daily She is going to contact her kidney doctor to let them know that her weights are stable as well as to notify that she is gone to be tested for COVID so she will not to be able to be in an office visit tomorrow  Insulin dependent diabetes mellitus ([place]) Assessment: Continues to have uncontrolled blood sugars Blood sugars range from 86-500 Working with Dr. [name] regarding this  Plan: Okay to stop prednisone on 08/05/2018 Notify Dr. [name] of  current blood sugars We will have follow-up in our office on 08/15/2018  ILD (interstitial lung disease) [facility]) Assessment: Patient with ILD seen on February/2020 CT chest Dr. Elsworth Soho had discussed doing a bronchoscopy with the patient back in March/2020, patient declined in March as well as declined in April during a tele-visit Patient now reporting that she is interested in proceeding forward with a bronchoscopy  Plan: We will discuss with Dr. Elsworth Soho, patient is now interested in proceeding forward the bronchoscopy Okay to stop prednisone on 08/05/2018, due to elevated blood sugars Tentative follow-up date on 08/15/2018 in office to see Dr. Elsworth Soho, pending COVID negative test  Respiratory failure with hypoxia ([place]) Plan: Continue 1 to 2 L of O2 at nighttime as prescribed  Acute on chronic diastolic CHF (congestive heart failure) ([place]) Plan: Patient to discuss weights as well as diuretic dose with cardiology   Follow Up Instructions:  Return in about 11 days (around 08/15/2018), or if symptoms worsen or fail to improve, for Follow up with Dr. Elsworth Soho.    I discussed the assessment and treatment plan with the patient. The patient was provided an opportunity to ask questions and all were answered. The patient agreed with the plan  and demonstrated an understanding of the instructions.   The patient was advised to call back or seek an in-person evaluation if the symptoms worsen or if the condition fails to improve as anticipated.  I provided 34 minutes of non-face-to-face time during this encounter.   Lauraine Rinne, NP

## 2018-08-04 ENCOUNTER — Telehealth: Payer: Self-pay | Admitting: *Deleted

## 2018-08-04 ENCOUNTER — Other Ambulatory Visit: Payer: Self-pay

## 2018-08-04 ENCOUNTER — Encounter: Payer: Self-pay | Admitting: Pulmonary Disease

## 2018-08-04 ENCOUNTER — Telehealth: Payer: Self-pay | Admitting: Pulmonary Disease

## 2018-08-04 ENCOUNTER — Other Ambulatory Visit: Payer: Medicare Other

## 2018-08-04 ENCOUNTER — Ambulatory Visit (INDEPENDENT_AMBULATORY_CARE_PROVIDER_SITE_OTHER): Payer: Medicare Other | Admitting: Pulmonary Disease

## 2018-08-04 DIAGNOSIS — Z794 Long term (current) use of insulin: Secondary | ICD-10-CM

## 2018-08-04 DIAGNOSIS — R6889 Other general symptoms and signs: Secondary | ICD-10-CM

## 2018-08-04 DIAGNOSIS — J849 Interstitial pulmonary disease, unspecified: Secondary | ICD-10-CM

## 2018-08-04 DIAGNOSIS — Z20822 Contact with and (suspected) exposure to covid-19: Secondary | ICD-10-CM

## 2018-08-04 DIAGNOSIS — I5033 Acute on chronic diastolic (congestive) heart failure: Secondary | ICD-10-CM

## 2018-08-04 DIAGNOSIS — N183 Chronic kidney disease, stage 3 unspecified: Secondary | ICD-10-CM

## 2018-08-04 DIAGNOSIS — J9611 Chronic respiratory failure with hypoxia: Secondary | ICD-10-CM | POA: Diagnosis not present

## 2018-08-04 DIAGNOSIS — IMO0001 Reserved for inherently not codable concepts without codable children: Secondary | ICD-10-CM

## 2018-08-04 DIAGNOSIS — E119 Type 2 diabetes mellitus without complications: Secondary | ICD-10-CM

## 2018-08-04 DIAGNOSIS — F17200 Nicotine dependence, unspecified, uncomplicated: Secondary | ICD-10-CM

## 2018-08-04 DIAGNOSIS — R0602 Shortness of breath: Secondary | ICD-10-CM

## 2018-08-04 DIAGNOSIS — F17210 Nicotine dependence, cigarettes, uncomplicated: Secondary | ICD-10-CM | POA: Diagnosis not present

## 2018-08-04 NOTE — Telephone Encounter (Signed)
08/04/2018 1335  Lauren, can you please contact the patient let her know that her appointment will be switched to me instead of Dr. Elsworth Soho as he will not be in office today.  We will do a chest x-ray.  If infiltrates are still present on the chest x-ray then we can consider proceeding forward with a bronchoscopy.  This is all pending patient's COVID test.  I have discussed this work-up with Dr. Elsworth Soho.  Wyn Quaker, FNP

## 2018-08-04 NOTE — Telephone Encounter (Signed)
Patient has new acute symptoms, chill-fevers-body aches and high risk--needs covid testing scheduled please

## 2018-08-04 NOTE — Addendum Note (Signed)
Addended by: Dimple Nanas on: 08/04/2018 11:59 AM   Modules accepted: Orders

## 2018-08-04 NOTE — Assessment & Plan Note (Signed)
Assessment: Patient with ILD seen on February/2020 CT chest Dr. Elsworth Soho had discussed doing a bronchoscopy with the patient back in March/2020, patient declined in March as well as declined in April during a tele-visit Patient now reporting that she is interested in proceeding forward with a bronchoscopy  Plan: We will discuss with Dr. Elsworth Soho, patient is now interested in proceeding forward the bronchoscopy Okay to stop prednisone on 08/05/2018, due to elevated blood sugars Tentative follow-up date on 08/15/2018 in office to see Dr. Elsworth Soho, pending COVID negative test

## 2018-08-04 NOTE — Telephone Encounter (Signed)
-----   Message from Rigoberto Noel, MD sent at 08/04/2018 11:56 AM EDT ----- Regarding: RE: Bronch - follow up 08/15/18 I am not in the office on 6/22 -Patrice please correct this I am actually working 2H at [employer]. She will need follow-up chest x-ray and if infiltrates still present consider bronchoscopy after covid testing  Rakesh  ----- Message ----- From: Lauraine Rinne, NP Sent: 08/04/2018  11:42 AM EDT To: Rigoberto Noel, MD Subject: Bronch - follow up 08/15/18                     Dr. Migdalia Dk just completed a tele-visit with this patient.  Blood sugars are still quite elevated.  I have informed patient she can stop her 5 mg daily prednisone at this time.  Patient is now is interested in proceeding forward with a bronchoscopy.  Unfortunately patient has acute symptoms at this time I am sending her to go get COVID test in the outpatient setting.  I have tentatively set up a follow-up with you on 08/15/2018 to discuss his bronc.  I have notified the patient that this obviously will be dependent on her COVID testing.  Wanted to give you a heads up.  Let me know if there is anything else you need me to do.  Aaron Edelman

## 2018-08-04 NOTE — Patient Instructions (Addendum)
We will send you to get COVID19 tested - [facility] today     Okay to stop prednisone 08/05/2018    Contact [facility] - change to tele symptoms  Contact Cardiology - Dr. Johnsie Cancel about your swelling and fluid pill  Contact [name] about your blood sugars today    I will talk with Dr. Elsworth Soho about getting you scheduled for a bronchoscopy     We recommend that you stop smoking.  >>>You need to set a quit date >>>If you have friends or family who smoke, let them know you are trying to quit and not to smoke around you or in your living environment  Smoking Cessation Resources:  1 800 QUIT NOW  >>> Patient to call this resource and utilize it to help support her quit smoking >>> Keep up your hard work with stopping smoking  You can also contact the [facility] >>>For smoking cessation classes call [phone]  We do not recommend using e-cigarettes as a form of stopping smoking  You can sign up for smoking cessation support texts and information:  >>>https://smokefree.gov/smokefreetxt   Return in about 11 days (around 08/15/2018), or if symptoms worsen or fail to improve, for Follow up with Dr. Elsworth Soho.   Coronavirus (COVID-19) Are you at risk?  Are you at risk for the Coronavirus (COVID-19)?  To be considered HIGH RISK for Coronavirus (COVID-19), you have to meet the following criteria:  . Traveled to [place], [place], [place], [place] or [place]; or in the [place] to [place], [place], or [place]; and have fever, cough, and shortness of breath within the last 2 weeks of travel OR . Been in close contact with a person diagnosed with COVID-19 within the last 2 weeks and have fever, cough, and shortness of breath . IF YOU DO NOT MEET THESE CRITERIA, YOU ARE CONSIDERED LOW RISK FOR COVID-19.  What to do if you are HIGH RISK for COVID-19?  Marland Kitchen If you are having a medical emergency, call 911. . Seek medical care right away. Before you go  to a doctor's office, urgent care or emergency department, call ahead and tell them about your recent travel, contact with someone diagnosed with COVID-19, and your symptoms. You should receive instructions from your physician's office regarding next steps of care.  . When you arrive at healthcare provider, tell the healthcare staff immediately you have returned from visiting [place], [place], [place], [place] or [place]; or traveled in the [place] to [place], [place], [place], or [place]; in the last two weeks or you have been in close contact with a person diagnosed with COVID-19 in the last 2 weeks.   . Tell the health care staff about your symptoms: fever, cough and shortness of breath. . After you have been seen by a medical provider, you will be either: o Tested for (COVID-19) and discharged home on quarantine except to seek medical care if symptoms worsen, and asked to  - Stay home and avoid contact with others until you get your results (4-5 days)  - Avoid travel on public transportation if possible (such as bus, train, or airplane) or o Sent to the Emergency Department by EMS for evaluation, COVID-19 testing, and possible admission depending on your condition and test results.  What to do if you are LOW RISK for COVID-19?  Reduce your risk of any infection by using the same precautions used for avoiding the common cold or flu:  .  Wash your hands often with soap and warm water for at least 20 seconds.  If soap and water are not readily available, use an alcohol-based hand sanitizer with at least 60% alcohol.  . If coughing or sneezing, cover your mouth and nose by coughing or sneezing into the elbow areas of your shirt or coat, into a tissue or into your sleeve (not your hands). . Avoid shaking hands with others and consider head nods or verbal greetings only. . Avoid touching your eyes, nose, or mouth with unwashed hands.  . Avoid close contact with people who are sick. . Avoid  places or events with large numbers of people in one location, like concerts or sporting events. . Carefully consider travel plans you have or are making. . If you are planning any travel outside or inside the [place], visit the CDC's Travelers' Health webpage for the latest health notices. . If you have some symptoms but not all symptoms, continue to monitor at home and seek medical attention if your symptoms worsen. . If you are having a medical emergency, call 911.   [place] / e-Visit: [email or web address]         [facility]: [facility]: [phone]                   [facility]: [phone]           It is flu season:   >>> Best ways to protect herself from the flu: Receive the yearly flu vaccine, practice good hand hygiene washing with soap and also using hand sanitizer when available, eat a nutritious meals, get adequate rest, hydrate appropriately   Please contact the office if your symptoms worsen or you have concerns that you are not improving.   Thank you for choosing [facility] for your healthcare, and for allowing [place] to partner with you on your healthcare journey. I am thankful to be able to provide care to you today.   Wyn Quaker FNP-C

## 2018-08-04 NOTE — Addendum Note (Signed)
Addended by: Amado Coe on: 08/04/2018 02:45 PM   Modules accepted: Orders

## 2018-08-04 NOTE — Assessment & Plan Note (Signed)
Plan: Stable weights Taking 60 mg of Lasix daily She is going to contact her kidney doctor to let them know that her weights are stable as well as to notify that she is gone to be tested for COVID so she will not to be able to be in an office visit tomorrow

## 2018-08-04 NOTE — Telephone Encounter (Signed)
ATC patient to let her know she has been switched to visit with Aaron Edelman instead of [place], unable to reach patient, message left on machine as per DPR.  Nothing further needed at this time. Order placed for xray

## 2018-08-04 NOTE — Assessment & Plan Note (Signed)
Assessment: Current everyday smoker Smoking 7 cigarettes a day Has not set a quit date  Plan: We recommend that you stop smoking You need to set a quit date

## 2018-08-04 NOTE — Assessment & Plan Note (Signed)
Plan: Continue 1 to 2 L of O2 at nighttime as prescribed

## 2018-08-04 NOTE — Assessment & Plan Note (Signed)
Assessment: 1 to 2 weeks of body aches, chills, increased cough, purulent sputum, bouts of fatigue Patient reporting low-grade fever on 08/01/2018 F 99 9 Patient denies any recent sick contacts  Plan: With patient's comorbidities, age: 73 years old, and current everyday smoker status as well as acute symptoms she has been having we will send her to complete outpatient SARS-CoV-2 testing  I have notified her to also contact her kidney doctor who she is planning on seeing tomorrow to let them know that she is going to be tested for COVID so she will not be able to complete in an office visit and this will need to be a tele-visit.  Tentative follow-up date with our office on 08/15/2018.  This will need to be rescheduled if patient is in fact positive for COVID.  This will need to be converted to a tele-visit.  Patient does not have a smart phone or computer at home so she is unable to complete a MyChart video visit.

## 2018-08-04 NOTE — Assessment & Plan Note (Signed)
Plan: Patient to discuss weights as well as diuretic dose with cardiology

## 2018-08-04 NOTE — Telephone Encounter (Signed)
Pt called and scheduled for testing at [facility] on 08/04/18 at 1:15 pm. Pt advised to wear a mask and remain in car for appt. Understanding verbalized.

## 2018-08-04 NOTE — Assessment & Plan Note (Signed)
Assessment: Continues to have uncontrolled blood sugars Blood sugars range from 86-500 Working with Dr. [name] regarding this  Plan: Okay to stop prednisone on 08/05/2018 Notify Dr. [name] of current blood sugars We will have follow-up in our office on 08/15/2018

## 2018-08-06 LAB — NOVEL CORONAVIRUS, NAA: SARS-CoV-2, NAA: NOT DETECTED

## 2018-08-11 ENCOUNTER — Other Ambulatory Visit: Payer: Self-pay | Admitting: Endocrinology

## 2018-08-14 NOTE — Progress Notes (Signed)
@Patient  ID: April Branch, female    DOB: 10-12-1945, 73 y.o.   MRN: [id number]  Chief Complaint  Patient presents with  . Follow-up    cxry today, ild     Referring provider: [name], MD  HPI:  73 year old female current every day smoker followed in our office by Dr. Elsworth Soho for interstitial lung disease  PMH: GERD, IBS, anxiety, hypertension, chronic pain syndrome, CKD stage III Smoker/ Smoking History: Current Everyday Smoker. 5 cigarettes a day.  75 pack year smoker.  Maintenance:  None  Pt of: Dr. Elsworth Soho   08/15/2018  - Visit   73 year old female completing follow-up with our office today as well as a chest x-ray.  Patient recently stopped prednisone which she had been put on due to ILD.  Patient has been doing well off of prednisone.  She reports that her weight has decreased.  Chest x-ray today does show improvement from previous chest x-ray in February but is now also showing a 8 x 9 mm nodule in right upper lobe.  Patient is still a current smoker.  She is working on stopping smoking.  She is down to 5 cigarettes a day.  75-pack-year smoking history.  She is not on any sort of maintenance inhaler.  Patient reports weight is down today.  She has been adherent to taking her Lasix.  She reports no worsening symptoms.  Patient denies need to use rescue inhaler or nebulized medications at this time.  Chest x-ray results from today are listed below:  08/15/2018-chest x-ray- persistent 9 x 8 mm nodular opacity right upper lobe, note that on prior CT this area was obscured by overlying pneumonia, this nodular opacity warrants additional surveillance advised noncontrast enhanced chest CT to further assess, bilateral upper lobe scarring   Tests:   2/10/20Perfusion scan was negative for PE. 2/9/20High-resolution CT chest multilobar pneumonia rather than ILD 03/2018 ANA , RA , ANCA neg  04/03/2018-CT chest high resolution-appearance of chest is favored reflect multilobar  bronchopneumonia than underlying ILD, if there is persistent clinical concern for ILD repeat high-resolution CT scan in 6 to 12 months  05/12/2018-pulmonary function test - FVC 1.67 (60% predicted), postbronchodilator ratio 81, postbronchodilator FEV1 1.34 (64% predicted), no bronchodilator response, DLCO 69 >>>Intraparenchymal restriction consistent with interstitial lung disease  03/17/2018-echocardiogram-LV ejection fraction 60 to 65%   08/15/2018-chest x-ray- persistent 9 x 8 mm nodular opacity right upper lobe, note that on prior CT this area was obscured by overlying pneumonia, this nodular opacity warrants additional surveillance advised noncontrast enhanced chest CT to further assess, bilateral upper lobe scarring  FENO:  No results found for: NITRICOXIDE  PFT: PFT Results Latest Ref Rng & Units 05/12/2018  FVC-Pre L 1.67  FVC-Predicted Pre % 60  FVC-Post L 1.65  FVC-Predicted Post % 59  Pre FEV1/FVC % % 82  Post FEV1/FCV % % 81  FEV1-Pre L 1.37  FEV1-Predicted Pre % 65  FEV1-Post L 1.34  DLCO UNC% % 69  DLCO COR %Predicted % 126  TLC L 3.53  TLC % Predicted % 74  RV % Predicted % 84    Imaging: Dg Chest 2 View  Result Date: 08/15/2018 CLINICAL DATA:  Shortness of breath EXAM: CHEST - 2 VIEW COMPARISON:  April 18, 2018 chest radiograph and chest CT April 03, 2018 FINDINGS: There is a persistent 9 x 8 mm nodular opacity in the periphery of the right upper lobe. There is mild scarring in the upper lobes bilaterally. There is no evident  edema or consolidation. Heart size and pulmonary vascular normal. No adenopathy. There is aortic atherosclerosis. There is degenerative change in the thoracic spine. IMPRESSION: 1. Persistent 9 x 8 mm nodular opacity right upper lobe. Note that on prior CT, this area was obscured by overlying pneumonia. This nodular opacity warrants additional surveillance; advise noncontrast enhanced chest CT to further assess. 2.  Bilateral upper lobe  scarring. 3.  No edema or consolidation. 4.  Cardiac silhouette within normal limits. 5.  Aortic Atherosclerosis (ICD10-I70.0). These results will be called to the ordering clinician or representative by the Radiologist Assistant, and communication documented in the PACS or zVision Dashboard. Electronically Signed   By: Lowella Grip III M.D.   On: 08/15/2018 09:29      Specialty Problems      Pulmonary Problems   COPD (chronic obstructive pulmonary disease) (HCC)   Respiratory failure with hypoxia (HCC)   Acute pulmonary edema (HCC)   ILD (interstitial lung disease) ([place])    Waxing and waning bilateral pulmonary infiltrates -seem to be steroid responsive rather than diuresis.   Differential diagnosis includes RB ILD, [place], chronic aspiration given her prior episodes of food impaction      Bronchitis   Pulmonary nodule    08/15/2018-chest x-ray- persistent 9 x 8 mm nodular opacity right upper lobe, note that on prior CT this area was obscured by overlying pneumonia, this nodular opacity warrants additional surveillance advised noncontrast enhanced chest CT to further assess, bilateral upper lobe scarring         No Known Allergies  Immunization History  Administered Date(s) Administered  . Influenza, High Dose Seasonal PF 11/12/2017, 11/14/2017  . Influenza-Unspecified 11/14/2014  . Pneumococcal Conjugate-13 11/14/2017  . Td 04/23/2005  . Tdap 06/14/2015    Past Medical History:  Diagnosis Date  . Anemia   . Anxiety   . Arthritis   . CAD (coronary artery disease) 2016   non-obstructive at cath  . Colon polyps 09/11/2010   Tubular adenoma  . COPD 12/14/2008   PATIENT DENIES    . Depression   . Diabetic coma ([place]) Feb. 2014  . Fall at home 2016   x 2 in January 2016, Left knee  . Fibromyalgia   . GERD 07/20/2006  . Headache(784.0)   . History of transient ischemic attack (TIA)   . HPV (human papilloma virus) infection   . Hyperlipidemia   . Hypertension   .  Hypothyroidism   . 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 Lumbar disc disease   . Neuromuscular disorder ([place])    PERIPHERAL NEUROPATHY  . Peripheral vascular disease ([place])   . Personal history of malignant neoplasm of kidney(V10.52) 12/14/2008   Laparoscopic biopsy and cryoablation 7/08 Dr. Vernie Shanks   . Renal disorder    chronic kidney disease   . Stroke [facility])    3 MINI STROKES  RT SIDED WEAKNESS  . Vertigo     Tobacco History: Social History   Tobacco Use  Smoking Status Current Every Day Smoker  . Packs/day: 1.50  . Years: 50.00  . Pack years: 75.00  . Types: Cigarettes  . Last attempt to quit: 06/24/2014  . Years since quitting: 4.1  Smokeless Tobacco Never Used  Tobacco Comment   08/15/2018 5 cigs a day   Ready to quit: Yes Counseling given: Yes Comment: 08/15/2018 5 cigs a day  Smoking assessment and cessation counseling  Patient currently smoking: 5 cigarettes daily  I have advised the patient  to quit/stop smoking as soon as possible due to high risk for multiple medical problems.  It will also be very difficult for [place] to manage patient's  respiratory symptoms and status if we continue to expose her lungs to a known irritant.  We do not advise e-cigarettes as a form of stopping smoking.  Patient is willing to quit smoking. Currently working on reducing. Hasnt set quit date.   I have advised the patient that we can assist and have options of nicotine replacement therapy, provided smoking cessation education today, provided smoking cessation counseling, and provided cessation resources.  Follow-up next office visit office visit for assessment of smoking cessation.    Smoking cessation counseling advised for: 4 min    Outpatient Encounter Medications as of 08/15/2018  Medication Sig  . ACCU-CHEK FASTCLIX LANCETS MISC Use 4 per day to check blood sugar dx code E11.9  . aspirin EC 325 MG EC tablet Take 1 tablet (325 mg total) by mouth  daily.  . cilostazol (PLETAL) 100 MG tablet TAKE 1 TABLET BY MOUTH TWICE DAILY BEFORE MEALS  . DULoxetine (CYMBALTA) 60 MG capsule TAKE 1 CAPSULE BY MOUTH ONCE DAILY  . ferrous sulfate 325 (65 FE) MG tablet Take 325 mg by mouth daily with breakfast.   . furosemide (LASIX) 20 MG tablet Take 1 tablet (20 mg total) by mouth daily. (Patient taking differently: Take 60 mg by mouth daily. )  . gabapentin (NEURONTIN) 300 MG capsule TAKE 1 CAPSULE BY MOUTH TWICE DAILY  . glucose blood (CONTOUR NEXT TEST) test strip Use to test blood sugar 3 times daily E11.9  . HYDROcodone-acetaminophen (NORCO) 10-325 MG tablet Take 1 tablet by mouth 3 (three) times daily as needed (for pain). Follow up with PCP if refills needed  . insulin lispro (HUMALOG) 100 UNIT/ML injection Inject 0.7 mLs (70 Units total) into the skin See admin instructions. Use 70 units daily in insulin pump  . Insulin Pen Needle (BD PEN NEEDLE NANO U/F) 32G X 4 MM MISC Use to inject insulin  . lansoprazole (PREVACID) 30 MG capsule Take 1 capsule (30 mg total) by mouth daily.  Marland Kitchen levothyroxine (SYNTHROID, LEVOTHROID) 150 MCG tablet TAKE 1 TABLET BY MOUTH ONCE DAILY  . metoprolol succinate (TOPROL-XL) 25 MG 24 hr tablet TAKE 1 TABLET BY MOUTH EVERY DAY (Patient taking differently: Take 25 mg by mouth daily. )  . nortriptyline (PAMELOR) 75 MG capsule Take 75 mg by mouth at bedtime.  Marland Kitchen nystatin (MYCOSTATIN) 100000 UNIT/ML suspension Take 5 mLs (500,000 Units total) by mouth 4 (four) times daily.  . OXYGEN Inhale 1 L into the lungs as needed (shortness of breath).  . potassium chloride (K-DUR) 10 MEQ tablet TAKE 1 TABLET BY MOUTH TWICE DAILY  . QUEtiapine (SEROQUEL) 100 MG tablet Take 100 mg by mouth at bedtime.  . tolterodine (DETROL LA) 4 MG 24 hr capsule Take 4 mg by mouth daily.   . Vitamin D, Ergocalciferol, (DRISDOL) 50000 units CAPS capsule Take 50,000 Units by mouth every Monday.   Marland Kitchen albuterol (PROVENTIL HFA;VENTOLIN HFA) 108 (90 Base) MCG/ACT  inhaler Inhale 1-2 puffs into the lungs every 6 (six) hours as needed for wheezing or shortness of breath. (Patient not taking: Reported on 08/15/2018)  . fludrocortisone (FLORINEF) 0.1 MG tablet TAKE 1 TABLET BY MOUTH 3 TIMES A WEEK (Patient not taking: Reported on 08/15/2018)  . fluticasone furoate-vilanterol (BREO ELLIPTA) 100-25 MCG/INH AEPB Inhale 1 puff into the lungs daily. (Patient not taking: Reported on 08/15/2018)  .  hydrOXYzine (ATARAX/VISTARIL) 25 MG tablet Take 1 tablet (25 mg total) by mouth every 6 (six) hours. (Patient not taking: Reported on 08/15/2018)  . ipratropium-albuterol (DUONEB) 0.5-2.5 (3) MG/3ML SOLN Take 3 mLs by nebulization every 6 (six) hours as needed. (Patient not taking: Reported on 08/15/2018)  . predniSONE (DELTASONE) 10 MG tablet TAKE 1 TABLET BY MOUTH EVERY DAY (Patient not taking: Reported on 08/15/2018)  . triamcinolone cream (KENALOG) 0.1 % Apply 1 application topically 2 (two) times daily. (Patient not taking: Reported on 08/15/2018)  . [DISCONTINUED] DULoxetine (CYMBALTA) 60 MG capsule TAKE 1 CAPSULE BY MOUTH ONCE DAILY   No facility-administered encounter medications on file as of 08/15/2018.      Review of Systems  Review of Systems  Constitutional: Positive for appetite change. Negative for chills, fatigue, fever and unexpected weight change.  HENT: Negative for congestion, postnasal drip, sinus pressure and sinus pain.   Respiratory: Negative for cough, chest tightness, shortness of breath and wheezing.   Cardiovascular: Positive for leg swelling. Negative for chest pain and palpitations.  Gastrointestinal: Negative for diarrhea, nausea and vomiting.  Genitourinary: Negative for dysuria, frequency and urgency.  Musculoskeletal: Negative for arthralgias.  Skin: Negative for color change.  Allergic/Immunologic: Negative for environmental allergies and food allergies.  Neurological: Negative for dizziness, light-headedness and headaches.   Psychiatric/Behavioral: Negative for dysphoric mood. The patient is not nervous/anxious.   All other systems reviewed and are negative.    Physical Exam  BP 140/68 (BP Location: Left Arm, Patient Position: Sitting, Cuff Size: Normal)   Pulse 90   Temp 98 F (36.7 C)   Ht 5\' 1"  (1.549 m)   Wt 165 lb (74.8 kg)   SpO2 95%   BMI 31.18 kg/m   Wt Readings from Last 5 Encounters:  08/15/18 165 lb (74.8 kg)  07/21/18 170 lb 12.8 oz (77.5 kg)  05/05/18 164 lb 9.6 oz (74.7 kg)  04/18/18 170 lb (77.1 kg)  04/13/18 167 lb 1.9 oz (75.8 kg)     Physical Exam  Constitutional: She is oriented to person, place, and time and well-developed, well-nourished, and in no distress. No distress.  HENT:  Head: Normocephalic and atraumatic.  Right Ear: Hearing, tympanic membrane, external ear and ear canal normal.  Left Ear: Hearing, tympanic membrane, external ear and ear canal normal.  Mouth/Throat: Uvula is midline and oropharynx is clear and moist. Abnormal dentition. No oropharyngeal exudate.  Eyes: Pupils are equal, round, and reactive to light.  Neck: Normal range of motion. Neck supple.  Cardiovascular: Normal rate, regular rhythm and normal heart sounds.  Pulmonary/Chest: Effort normal and breath sounds normal. No accessory muscle usage. No respiratory distress. She has no decreased breath sounds. She has no wheezes. She has no rhonchi.  Left upper lobe crackle  Musculoskeletal: Normal range of motion.        General: Edema (2+ lower extremity bilateral) present.  Lymphadenopathy:    She has no cervical adenopathy.  Neurological: She is alert and oriented to person, place, and time. Gait normal.  Skin: Skin is warm and dry. She is not diaphoretic. No erythema.  Psychiatric: Mood, memory, affect and judgment normal.  Nursing note and vitals reviewed.     Lab Results:  CBC    Component Value Date/Time   WBC 9.4 04/07/2018 0333   RBC 2.90 (L) 04/07/2018 0333   HGB 7.7 (L)  04/07/2018 0333   HGB 11.9 04/20/2017 1047   HCT 24.1 (L) 04/07/2018 0333   HCT 34.9 04/20/2017 1047  PLT 264 04/07/2018 0333   PLT 350 04/20/2017 1047   MCV 83.1 04/07/2018 0333   MCV 89 04/20/2017 1047   MCH 26.6 04/07/2018 0333   MCHC 32.0 04/07/2018 0333   RDW 15.9 (H) 04/07/2018 0333   RDW 14.7 04/20/2017 1047   LYMPHSABS 0.9 04/07/2018 0333   LYMPHSABS 1.9 04/20/2017 1047   MONOABS 0.5 04/07/2018 0333   EOSABS 0.0 04/07/2018 0333   EOSABS 0.1 04/20/2017 1047   BASOSABS 0.0 04/07/2018 0333   BASOSABS 0.1 04/20/2017 1047    BMET    Component Value Date/Time   NA 139 07/21/2018 1500   NA 138 04/20/2017 1047   K 3.6 07/21/2018 1500   CL 98 07/21/2018 1500   CO2 30 07/21/2018 1500   GLUCOSE 126 (H) 07/21/2018 1500   BUN 26 (H) 07/21/2018 1500   BUN 17 04/20/2017 1047   CREATININE 1.94 (H) 07/21/2018 1500   CALCIUM 9.0 07/21/2018 1500   GFRNONAA 34 (L) 04/07/2018 0333   GFRAA 40 (L) 04/07/2018 0333    BNP    Component Value Date/Time   BNP 331.9 (H) 04/02/2018 1805    ProBNP    Component Value Date/Time   PROBNP 330.8 (H) 09/21/2012 1330      Assessment & Plan:   Abnormal findings on diagnostic imaging of lung Plan: Chest x-ray today shows persistent 9 x 8 mm nodular opacity right upper lobe, we will order a CT chest noncontrast to be completed over the next 2 weeks  Pulmonary nodule Assessment: June/2020 chest x-ray shows persistent 9 x 8 mm nodular opacity right upper lobe 75-pack-year smoker, current every day smoker  Plan: CT chest without contrast  DISORDER, TOBACCO USE Assessment: Current everyday smoker 75-pack-year smoking history Has not set quit date Actively working on reducing smoking is down to 5 cigarettes daily Abnormal chest x-ray today  Plan: CT chest without contrast Referral to lung cancer screening program for future follow-up   ILD (interstitial lung disease) ([place]) Assessment: Patient with ILD seen in February/2020  high-resolution CT chest Chest x-ray today shows improved bilateral opacities but persistent right upper lobe pulmonary nodule Discussed case with Dr. Elsworth Soho, will consider bronchoscopy if infiltrates resume Patient stopped taking prednisone 2 weeks ago Patient reporting no changes in any of her breathing or symptoms at this time  Plan: CT chest to be ordered Follow-up with our office in 2 months    Return in about 2 months (around 10/15/2018), or if symptoms worsen or fail to improve, for Follow up with Dr. Elsworth Soho, Follow up with Wyn Quaker FNP-C.   Lauraine Rinne, NP 08/15/2018   This appointment was 32 minutes long with over 50% of the time in direct face-to-face patient care, assessment, plan of care, and follow-up.

## 2018-08-15 ENCOUNTER — Ambulatory Visit (INDEPENDENT_AMBULATORY_CARE_PROVIDER_SITE_OTHER): Payer: Medicare Other

## 2018-08-15 ENCOUNTER — Ambulatory Visit: Payer: Medicare Other | Admitting: Pulmonary Disease

## 2018-08-15 ENCOUNTER — Telehealth: Payer: Self-pay | Admitting: Pulmonary Disease

## 2018-08-15 ENCOUNTER — Other Ambulatory Visit: Payer: Self-pay

## 2018-08-15 ENCOUNTER — Encounter: Payer: Self-pay | Admitting: Pulmonary Disease

## 2018-08-15 VITALS — BP 140/68 | HR 90 | Temp 98.0°F | Ht 61.0 in | Wt 165.0 lb

## 2018-08-15 DIAGNOSIS — R918 Other nonspecific abnormal finding of lung field: Secondary | ICD-10-CM | POA: Diagnosis not present

## 2018-08-15 DIAGNOSIS — J849 Interstitial pulmonary disease, unspecified: Secondary | ICD-10-CM

## 2018-08-15 DIAGNOSIS — R911 Solitary pulmonary nodule: Secondary | ICD-10-CM

## 2018-08-15 DIAGNOSIS — F17200 Nicotine dependence, unspecified, uncomplicated: Secondary | ICD-10-CM

## 2018-08-15 DIAGNOSIS — R0602 Shortness of breath: Secondary | ICD-10-CM

## 2018-08-15 DIAGNOSIS — F17210 Nicotine dependence, cigarettes, uncomplicated: Secondary | ICD-10-CM | POA: Diagnosis not present

## 2018-08-15 NOTE — Assessment & Plan Note (Signed)
Plan: Chest x-ray today shows persistent 9 x 8 mm nodular opacity right upper lobe, we will order a CT chest noncontrast to be completed over the next 2 weeks

## 2018-08-15 NOTE — Assessment & Plan Note (Signed)
Assessment: Patient with ILD seen in February/2020 high-resolution CT chest Chest x-ray today shows improved bilateral opacities but persistent right upper lobe pulmonary nodule Discussed case with Dr. Elsworth Soho, will consider bronchoscopy if infiltrates resume Patient stopped taking prednisone 2 weeks ago Patient reporting no changes in any of her breathing or symptoms at this time  Plan: CT chest to be ordered Follow-up with our office in 2 months

## 2018-08-15 NOTE — Telephone Encounter (Signed)
Noted. Nothing further needed. 

## 2018-08-15 NOTE — Patient Instructions (Addendum)
Based off your chest x-ray today we will order a noncontrast chest CT for you to complete over the next 2 weeks  Continue to work hard on stopping smoking! proud of you!  We recommend that you stop smoking.  >>>You need to set a quit date >>>If you have friends or family who smoke, let them know you are trying to quit and not to smoke around you or in your living environment  Smoking Cessation Resources:  1 800 QUIT NOW  >>> Patient to call this resource and utilize it to help support her quit smoking >>> Keep up your hard work with stopping smoking  You can also contact the [facility] >>>For smoking cessation classes call [phone]  We do not recommend using e-cigarettes as a form of stopping smoking  You can sign up for smoking cessation support texts and information:  >>>https://smokefree.gov/smokefreetxt     We will also refer you to lung cancer screening program  We will refer you today to her lung cancer screening program >>>This is based off of your 75 pack-year smoking history >>> This is a recommendation from the [place] preventative services task force (USPSTF) >>>The USPSTF recommends annual screening for lung cancer with low-dose computed tomography (LDCT) in adults aged 71 to 64 years who have a 30 pack-year smoking history and currently smoke or have quit within the past 15 years. Screening should be discontinued once a person has not smoked for 15 years or develops a health problem that substantially limits life expectancy or the ability or willingness to have curative lung surgery.   Our office will call you and set up an appointment with Eric Form (Nurse Practitioner) who leads this program.  This appointment takes place in our office.  After completing this meeting with Eric Form NP you will get a low-dose CT as the screening >>>We will call you with those results   Return in about 2 months (around 10/15/2018), or if symptoms worsen or fail to  improve, for Follow up with Dr. Elsworth Soho, Follow up with Wyn Quaker FNP-C.   Coronavirus (COVID-19) Are you at risk?  Are you at risk for the Coronavirus (COVID-19)?  To be considered HIGH RISK for Coronavirus (COVID-19), you have to meet the following criteria:  . Traveled to [place], [place], [place], [place] or [place]; or in the [place] to [place], [place], [place], or [place]; and have fever, cough, and shortness of breath within the last 2 weeks of travel OR . Been in close contact with a person diagnosed with COVID-19 within the last 2 weeks and have fever, cough, and shortness of breath . IF YOU DO NOT MEET THESE CRITERIA, YOU ARE CONSIDERED LOW RISK FOR COVID-19.  What to do if you are HIGH RISK for COVID-19?  Marland Kitchen If you are having a medical emergency, call 911. . Seek medical care right away. Before you go to a doctor's office, urgent care or emergency department, call ahead and tell them about your recent travel, contact with someone diagnosed with COVID-19, and your symptoms. You should receive instructions from your physician's office regarding next steps of care.  . When you arrive at healthcare provider, tell the healthcare staff immediately you have returned from visiting [place], [place], [place], [place] or [place]; or traveled in the [place] to [place], [place], [address] [place], or [place]; in the last two weeks or you have been in close contact with a person diagnosed with COVID-19 in the  last 2 weeks.   . Tell the health care staff about your symptoms: fever, cough and shortness of breath. . After you have been seen by a medical provider, you will be either: o Tested for (COVID-19) and discharged home on quarantine except to seek medical care if symptoms worsen, and asked to  - Stay home and avoid contact with others until you get your results (4-5 days)  - Avoid travel on public transportation if possible (such as bus, train, or airplane) or o Sent to the  Emergency Department by EMS for evaluation, COVID-19 testing, and possible admission depending on your condition and test results.  What to do if you are LOW RISK for COVID-19?  Reduce your risk of any infection by using the same precautions used for avoiding the common cold or flu:  Marland Kitchen Wash your hands often with soap and warm water for at least 20 seconds.  If soap and water are not readily available, use an alcohol-based hand sanitizer with at least 60% alcohol.  . If coughing or sneezing, cover your mouth and nose by coughing or sneezing into the elbow areas of your shirt or coat, into a tissue or into your sleeve (not your hands). . Avoid shaking hands with others and consider head nods or verbal greetings only. . Avoid touching your eyes, nose, or mouth with unwashed hands.  . Avoid close contact with people who are sick. . Avoid places or events with large numbers of people in one location, like concerts or sporting events. . Carefully consider travel plans you have or are making. . If you are planning any travel outside or inside the [place], visit the CDC's Travelers' Health webpage for the latest health notices. . If you have some symptoms but not all symptoms, continue to monitor at home and seek medical attention if your symptoms worsen. . If you are having a medical emergency, call 911.   [place] / e-Visit: [email or web address]         [facility]: [facility]: [phone]                   [facility]: [phone]           It is flu season:   >>> Best ways to protect herself from the flu: Receive the yearly flu vaccine, practice good hand hygiene washing with soap and also using hand sanitizer when available, eat a nutritious meals, get adequate rest, hydrate appropriately   Please contact the office if your symptoms  worsen or you have concerns that you are not improving.   Thank you for choosing [facility] for your healthcare, and for allowing [place] to partner with you on your healthcare journey. I am thankful to be able to provide care to you today.   Wyn Quaker FNP-C    Health Risks of Smoking Smoking cigarettes is very bad for your health. Tobacco smoke has over 200 known poisons in it. It contains the poisonous gases nitrogen oxide and carbon monoxide. There are over 60 chemicals in tobacco smoke that cause cancer. Smoking is difficult to quit because a chemical in tobacco, called nicotine, causes addiction or dependence. When you smoke and inhale, nicotine is absorbed rapidly into the bloodstream through your lungs. Both inhaled and non-inhaled nicotine may be addictive. What are the risks of cigarette smoke? Cigarette smokers have an increased risk of many serious medical problems,  including:  Lung cancer.  Lung disease, such as pneumonia, bronchitis, and emphysema.  Chest pain (angina) and heart attack because the heart is not getting enough oxygen.  Heart disease and peripheral blood vessel disease.  High blood pressure (hypertension).  Stroke.  Oral cancer, including cancer of the lip, mouth, or voice box.  Bladder cancer.  Pancreatic cancer.  Cervical cancer.  Pregnancy complications, including premature birth.  Stillbirths and smaller newborn babies, birth defects, and genetic damage to sperm.  Early menopause.  Lower estrogen level for women.  Infertility.  Facial wrinkles.  Blindness.  Increased risk of broken bones (fractures).  Senile dementia.  Stomach ulcers and internal bleeding.  Delayed wound healing and increased risk of complications during surgery.  Even smoking lightly shortens your life expectancy by several years. Because of secondhand smoke exposure, children of smokers have an increased risk of the following:  Sudden infant death  syndrome (SIDS).  Respiratory infections.  Lung cancer.  Heart disease.  Ear infections. What are the benefits of quitting? There are many health benefits of quitting smoking. Here are some of them:  Within days of quitting smoking, your risk of having a heart attack decreases, your blood flow improves, and your lung capacity improves. Blood pressure, pulse rate, and breathing patterns start returning to normal soon after quitting.  Within months, your lungs may clear up completely.  Quitting for 10 years reduces your risk of developing lung cancer and heart disease to almost that of a nonsmoker.  People who quit may see an improvement in their overall quality of life. How do I quit smoking?     Smoking is an addiction with both physical and psychological effects, and longtime habits can be hard to change. Your health care provider can recommend:  Programs and community resources, which may include group support, education, or talk therapy.  Prescription medicines to help reduce cravings.  Nicotine replacement products, such as patches, gum, and nasal sprays. Use these products only as directed. Do not replace cigarette smoking with electronic cigarettes, which are commonly called e-cigarettes. The safety of e-cigarettes is not known, and some may contain harmful chemicals.  A combination of two or more of these methods. Where to find more information  American Lung Association: www.lung.org  American Cancer Society: www.cancer.org Summary  Smoking cigarettes is very bad for your health. Cigarette smokers have an increased risk of many serious medical problems, including several cancers, heart disease, and stroke.  Smoking is an addiction with both physical and psychological effects, and longtime habits can be hard to change.  By stopping right away, you can greatly reduce the risk of medical problems for you and your family.  To help you quit smoking, your health care  provider can recommend programs, community resources, prescription medicines, and nicotine replacement products such as patches, gum, and nasal sprays. This information is not intended to replace advice given to you by your health care provider. Make sure you discuss any questions you have with your health care provider. Document Released: 03/19/2004 Document Revised: 05/13/2017 Document Reviewed: 02/14/2016 Elsevier Interactive Patient Education  2019 [employer].

## 2018-08-15 NOTE — Assessment & Plan Note (Signed)
Assessment: June/2020 chest x-ray shows persistent 9 x 8 mm nodular opacity right upper lobe 75-pack-year smoker, current every day smoker  Plan: CT chest without contrast

## 2018-08-15 NOTE — Assessment & Plan Note (Signed)
Assessment: Current everyday smoker 75-pack-year smoking history Has not set quit date Actively working on reducing smoking is down to 5 cigarettes daily Abnormal chest x-ray today  Plan: CT chest without contrast Referral to lung cancer screening program for future follow-up

## 2018-08-15 NOTE — Telephone Encounter (Signed)
Took call report from [facility] concerning patient's cxr results from today  Please view impression copied from cxr results:  IMPRESSION: 1. Persistent 9 x 8 mm nodular opacity right upper lobe. Note that on prior CT, this area was obscured by overlying pneumonia. This nodular opacity warrants additional surveillance; advise noncontrast enhanced chest CT to further assess.  2.  Bilateral upper lobe scarring.  3.  No edema or consolidation.  4.  Cardiac silhouette within normal limits.  5.  Aortic Atherosclerosis (ICD10-I70.0).  Informed Truman Hayward that results are in epic Will route to [facility] NP to make him aware of results

## 2018-08-15 NOTE — Progress Notes (Signed)
Discussed in office.  CT chest without contrast ordered.  Wyn Quaker FNP

## 2018-08-15 NOTE — Telephone Encounter (Signed)
Already seen patient.  CT chest ordered.  April Branch

## 2018-08-15 NOTE — Addendum Note (Signed)
Addended by: Blaine Hamper L on: 08/15/2018 11:25 AM   Modules accepted: Orders

## 2018-08-18 ENCOUNTER — Ambulatory Visit: Payer: Medicare Other | Admitting: Pulmonary Disease

## 2018-08-25 ENCOUNTER — Other Ambulatory Visit: Payer: Self-pay | Admitting: Vascular Surgery

## 2018-08-29 ENCOUNTER — Inpatient Hospital Stay: Admission: RE | Admit: 2018-08-29 | Payer: Medicare Other | Source: Ambulatory Visit

## 2018-09-05 ENCOUNTER — Other Ambulatory Visit: Payer: Self-pay

## 2018-09-05 ENCOUNTER — Encounter: Payer: Medicare Other | Attending: Endocrinology | Admitting: Nutrition

## 2018-09-05 ENCOUNTER — Ambulatory Visit: Payer: Medicare Other | Admitting: Endocrinology

## 2018-09-05 ENCOUNTER — Encounter: Payer: Self-pay | Admitting: Endocrinology

## 2018-09-05 VITALS — BP 142/68 | HR 84 | Temp 99.0°F | Ht 61.0 in | Wt 163.8 lb

## 2018-09-05 DIAGNOSIS — E11649 Type 2 diabetes mellitus with hypoglycemia without coma: Secondary | ICD-10-CM | POA: Insufficient documentation

## 2018-09-05 DIAGNOSIS — E1142 Type 2 diabetes mellitus with diabetic polyneuropathy: Secondary | ICD-10-CM

## 2018-09-05 DIAGNOSIS — Z794 Long term (current) use of insulin: Secondary | ICD-10-CM

## 2018-09-05 DIAGNOSIS — E1143 Type 2 diabetes mellitus with diabetic autonomic (poly)neuropathy: Secondary | ICD-10-CM

## 2018-09-05 DIAGNOSIS — R413 Other amnesia: Secondary | ICD-10-CM | POA: Diagnosis not present

## 2018-09-05 DIAGNOSIS — E1165 Type 2 diabetes mellitus with hyperglycemia: Secondary | ICD-10-CM

## 2018-09-05 DIAGNOSIS — E038 Other specified hypothyroidism: Secondary | ICD-10-CM

## 2018-09-05 LAB — BASIC METABOLIC PANEL
BUN: 12 mg/dL (ref 6–23)
CO2: 28 mEq/L (ref 19–32)
Calcium: 9.1 mg/dL (ref 8.4–10.5)
Chloride: 101 mEq/L (ref 96–112)
Creatinine, Ser: 1.45 mg/dL — ABNORMAL HIGH (ref 0.40–1.20)
GFR: 35.59 mL/min — ABNORMAL LOW (ref >60.00)
Glucose, Bld: 136 mg/dL — ABNORMAL HIGH (ref 70–99)
Potassium: 3.4 mEq/L — ABNORMAL LOW (ref 3.5–5.1)
Sodium: 140 mEq/L (ref 135–145)

## 2018-09-05 LAB — T4, FREE: Free T4: 0.92 ng/dL (ref 0.60–1.60)

## 2018-09-05 LAB — TSH: TSH: 0.4 u[IU]/mL (ref 0.35–4.50)

## 2018-09-05 MED ORDER — CONTOUR NEXT TEST VI STRP: ORAL_STRIP | 8 refills | Status: AC

## 2018-09-05 NOTE — Patient Instructions (Signed)
Enter all sugars in pump when high

## 2018-09-05 NOTE — Progress Notes (Signed)
Patient ID: April Branch, female   DOB: 1945-08-14, 73 y.o.   MRN: [id number]           Reason for Appointment: Follow-up  for Type 2 Diabetes  Referring physician: Pattricia Boss  History of Present Illness:          Date of diagnosis of type 2 diabetes mellitus: 1980s       Previous history:    She thinks she was treated with metformin for some time before she went on insulin in ?  2002   She has mostly been treated with Lantus and rapid acting insulin such as Apidra or Novolog.   She thinks that Apidra worked fairly fast but insurance did not cover this, generally has had poor control   On her initial consultation she was switched from Lantus to [employer] because of blood sugars mostly over 300  Was also started on metformin ER 750 mg, 2 tablets daily initially and  in 3/17 she was started on Invokana 100 mg daily because of  poor control  Recent history:   INSULIN regimen: OMNIPOD pump using Humalog U-100 insulin  Current settings: Basal rate: Midnight = 1.35, 8 a.m. = 2.1units/h, 12 PM = 2.5, 10 PM = 2.0 Bolus presets 4 units breakfast, 4 units lunch and 4-6 at dinnertime  Recent basal daily dose 48 units Correction factor I: 60, target 125  Current blood sugar patterns and problems identified:  Her last A1c was up to 9.9  This was primarily from being on steroids, poor diet and stress as well as weight gain  She is now off her prednisone  Has lost weight but her blood sugars are quite erratic  She forgets to do most of her boluses and only randomly will enter her blood sugar in the pump even though she is checking her blood sugars 2-3 times a day on her meter  HYPOGLYCEMIA has been occurring randomly but only twice in the morning around 10 AM  Otherwise has a couple of low sugars related to bolusing 40 g of carbohydrate and not eating a full meal, lowest blood sugar 50  Some of her hyperglycemia is related to missing her boluses and at times eating sweets  Overall  blood sugars are quite variable in the afternoons and evenings but relatively higher early afternoon because of missing boluses at breakfast and lunch  FASTING blood sugars in the mornings are recently quite variable but overall fasting readings have been quite normal  Overnight blood sugars are not being checked but one time and it was over 200, no hypoglycemia overnight  Previously was on Invokana but this was stopped because of renal dysfunction times and low blood pressure  Oral hypoglycemic drugs the patient is taking are: None  Side effects from medications have been: none  Compliance with the medical regimen: Inconsistent   Glucose monitoring:  done 2-3 times a day         Glucometer:  Accu-Chek     Blood Glucose readings by download and monitor:   PRE-MEAL Fasting  midday Dinner Bedtime Overall  Glucose range:  59-200  64-240  114-298  70-344   Mean/median:  111   0.78  179             POST-MEAL PC Breakfast PC Lunch PC Dinner  Glucose range:     Mean/median:   207    ' PREVIOUS readings:  PRE-MEAL Fasting Lunch Dinner Bedtime Overall  Glucose range:  75-315  77-333  290-580  156-130   Mean/median:      336    Self-care:    Typical meal intake: Breakfast is variable at 11 AM, frequently no lunch, dinner 7 pm              Dietician visit, most recent: never               Exercise: Unable to do much physical activity  Weight history:  Highest previous weight 199   Wt Readings from Last 3 Encounters:  09/05/18 163 lb 12.8 oz (74.3 kg)  08/15/18 165 lb (74.8 kg)  07/21/18 170 lb 12.8 oz (77.5 kg)    Glycemic control:   Lab Results  Component Value Date   HGBA1C 9.9 (A) 07/21/2018   HGBA1C 7.9 (H) 04/03/2018   HGBA1C 7.5 (H) 03/17/2018   Lab Results  Component Value Date   MICROALBUR 1.8 08/17/2017   LDLCALC 64 03/17/2018   CREATININE 1.94 (H) 07/21/2018    No visits with results within 1 Week(s) from this visit.  Latest known visit with results  is:  Appointment on 08/04/2018  Component Date Value Ref Range Status  . SARS-CoV-2, NAA 08/04/2018 Not Detected  Not Detected Final   Comment: This test was developed and its performance characteristics determined by [employer]. This test has not been FDA cleared or approved. This test has been authorized by FDA under an Emergency Use Authorization (EUA). This test is only authorized for the duration of time the declaration that circumstances exist justifying the authorization of the emergency use of in vitro diagnostic tests for detection of SARS-CoV-2 virus and/or diagnosis of COVID-19 infection under section 564(b)(1) of the Act, 21 U.S.C. 195KDT-2(I)(7), unless the authorization is terminated or revoked sooner. When diagnostic testing is negative, the possibility of a false negative result should be considered in the context of a patient's recent exposures and the presence of clinical signs and symptoms consistent with COVID-19. An individual without symptoms of COVID-19 and who is not shedding SARS-CoV-2 virus would expect to have a negative (not detected) result in this assay.     OTHER active problems: See review of systems    Allergies as of 09/05/2018   No Known Allergies     Medication List       Accurate as of September 05, 2018 10:29 AM. If you have any questions, ask your nurse or doctor.        Accu-Chek FastClix Lancets Misc Use 4 per day to check blood sugar dx code E11.9   albuterol 108 (90 Base) MCG/ACT inhaler Commonly known as: VENTOLIN HFA Inhale 1-2 puffs into the lungs every 6 (six) hours as needed for wheezing or shortness of breath.   aspirin 325 MG EC tablet Take 1 tablet (325 mg total) by mouth daily.   cilostazol 100 MG tablet Commonly known as: PLETAL TAKE 1 TABLET BY MOUTH TWICE A DAY   DULoxetine 60 MG capsule Commonly known as: CYMBALTA TAKE 1 CAPSULE BY MOUTH ONCE DAILY   ferrous sulfate 325 (65 FE) MG tablet Take 325 mg by  mouth daily with breakfast.   fludrocortisone 0.1 MG tablet Commonly known as: FLORINEF TAKE 1 TABLET BY MOUTH 3 TIMES A WEEK   furosemide 20 MG tablet Commonly known as: Lasix Take 1 tablet (20 mg total) by mouth daily. What changed: how much to take   gabapentin 300 MG capsule Commonly known as: NEURONTIN TAKE 1 CAPSULE BY MOUTH TWICE DAILY   glucose blood test  strip Commonly known as: Contour Next Test Use to test blood sugar 3 times daily E11.9   HYDROcodone-acetaminophen 10-325 MG tablet Commonly known as: NORCO Take 1 tablet by mouth 3 (three) times daily as needed (for pain). Follow up with PCP if refills needed   hydrOXYzine 25 MG tablet Commonly known as: ATARAX/VISTARIL Take 1 tablet (25 mg total) by mouth every 6 (six) hours.   insulin lispro 100 UNIT/ML injection Commonly known as: HumaLOG Inject 0.7 mLs (70 Units total) into the skin See admin instructions. Use 70 units daily in insulin pump What changed: additional instructions   Insulin Pen Needle 32G X 4 MM Misc Commonly known as: BD Pen Needle Nano U/F Use to inject insulin   ipratropium-albuterol 0.5-2.5 (3) MG/3ML Soln Commonly known as: DUONEB Take 3 mLs by nebulization every 6 (six) hours as needed.   lansoprazole 30 MG capsule Commonly known as: PREVACID Take 1 capsule (30 mg total) by mouth daily.   levothyroxine 150 MCG tablet Commonly known as: SYNTHROID TAKE 1 TABLET BY MOUTH ONCE DAILY   metoprolol succinate 25 MG 24 hr tablet Commonly known as: TOPROL-XL TAKE 1 TABLET BY MOUTH EVERY DAY   nortriptyline 75 MG capsule Commonly known as: PAMELOR Take 75 mg by mouth at bedtime.   nystatin 100000 UNIT/ML suspension Commonly known as: MYCOSTATIN Take 5 mLs (500,000 Units total) by mouth 4 (four) times daily.   OXYGEN Inhale 1 L into the lungs as needed (shortness of breath).   potassium chloride 10 MEQ tablet Commonly known as: K-DUR TAKE 1 TABLET BY MOUTH TWICE DAILY    predniSONE 10 MG tablet Commonly known as: DELTASONE TAKE 1 TABLET BY MOUTH EVERY DAY   QUEtiapine 100 MG tablet Commonly known as: SEROQUEL Take 100 mg by mouth at bedtime.   tolterodine 4 MG 24 hr capsule Commonly known as: DETROL LA Take 4 mg by mouth daily.   triamcinolone cream 0.1 % Commonly known as: KENALOG Apply 1 application topically 2 (two) times daily.   Vitamin D (Ergocalciferol) 1.25 MG (50000 UT) Caps capsule Commonly known as: DRISDOL Take 50,000 Units by mouth every Monday.       Allergies: No Known Allergies  Past Medical History:  Diagnosis Date  . Anemia   . Anxiety   . Arthritis   . CAD (coronary artery disease) 2016   non-obstructive at cath  . Colon polyps 09/11/2010   Tubular adenoma  . COPD 12/14/2008   PATIENT DENIES    . Depression   . Diabetic coma ([place]) Feb. 2014  . Fall at home 2016   x 2 in January 2016, Left knee  . Fibromyalgia   . GERD 07/20/2006  . Headache(784.0)   . History of transient ischemic attack (TIA)   . HPV (human papilloma virus) infection   . Hyperlipidemia   . Hypertension   . Hypothyroidism   . Insulin dependent type 2 diabetes mellitus, controlled ([place]) 1989  . Lumbar disc disease   . Neuromuscular disorder ([place])    PERIPHERAL NEUROPATHY  . Peripheral vascular disease ([place])   . Personal history of malignant neoplasm of kidney(V10.52) 12/14/2008   Laparoscopic biopsy and cryoablation 7/08 Dr. Vernie Shanks   . Renal disorder    chronic kidney disease   . Stroke [facility])    3 MINI STROKES  RT SIDED WEAKNESS  . Vertigo     Past Surgical History:  Procedure Laterality Date  . ABDOMINAL AORTAGRAM N/A 08/21/2011   Procedure: ABDOMINAL AORTAGRAM;  Surgeon:  Elam Dutch, MD;  Location: [facility] CATH LAB;  Service: Cardiovascular;  Laterality: N/A;  . ABDOMINAL AORTAGRAM N/A 03/16/2014   Procedure: ABDOMINAL Maxcine Ham;  Surgeon: Elam Dutch, MD;  Location: [facility] CATH LAB;  Service: Cardiovascular;  Laterality: N/A;   . ABDOMINAL HYSTERECTOMY    . APPENDECTOMY    . BLADDER SUSPENSION    . CARDIAC CATHETERIZATION    . CHOLECYSTECTOMY OPEN    . ESOPHAGOGASTRODUODENOSCOPY (EGD) WITH PROPOFOL N/A 05/16/2014   Procedure: ESOPHAGOGASTRODUODENOSCOPY (EGD) WITH PROPOFOL with Balloon dilation;  Surgeon: Milus Banister, MD;  Location: [place];  Service: Endoscopy;  Laterality: N/A;  . ESOPHAGOGASTRODUODENOSCOPY (EGD) WITH PROPOFOL N/A 09/02/2015   Procedure: ESOPHAGOGASTRODUODENOSCOPY (EGD) WITH PROPOFOL;  Surgeon: Doran Stabler, MD;  Location: [place];  Service: Gastroenterology;  Laterality: N/A;  . FEMORAL-POPLITEAL BYPASS GRAFT  10/12/2011   Procedure: BYPASS GRAFT FEMORAL-POPLITEAL ARTERY;  Surgeon: Elam Dutch, MD;  Location: [facility] OR;  Service: Vascular;  Laterality: Left;  Left Femoral-Popliteal Bypass Graft using 66mm x 80cm Propaten Graft with intraop arteriogram times one.  . FEMORAL-POPLITEAL BYPASS GRAFT Left 04/17/2014   Procedure: REDO LEFT FEMORAL-POPLITEAL ARTERY BYPASS USING GORE PROPATEN 37mmx80cm GRAFT;  Surgeon: Elam Dutch, MD;  Location: [place];  Service: Vascular;  Laterality: Left;  . FOOT SURGERY Left    "bone spurs"  . INTRAOPERATIVE ARTERIOGRAM  10/12/2011   Procedure: INTRA OPERATIVE ARTERIOGRAM;  Surgeon: Elam Dutch, MD;  Location: [place];  Service: Vascular;  Laterality: Left;  . KNEE ARTHROSCOPY Bilateral    "cartilage"  . Laparascopic cryoablation of left kidney  08/2006   Dr. Gladis Riffle for renal cell cancer  . LEFT HEART CATHETERIZATION WITH CORONARY ANGIOGRAM N/A 05/11/2011   Procedure: LEFT HEART CATHETERIZATION WITH CORONARY ANGIOGRAM;  Surgeon: Jolaine Artist, MD;  Location[facility] C[facility] LAB;  Service: Cardiovascular;  Laterality: N/A;  . LUNG SURGERY Right    lung nodule removed from the right side  . PR VEIN BYPASS GRAFT,AORTO-FEM-POP  05/27/2010  . SHOULDER ARTHROSCOPY Bilateral    'Spurs"  . TUBAL LIGATION      Family History  Problem Relation Age of  Onset  . Heart disease Mother   . Lung cancer Mother   . Cancer Mother        Lung  . Heart disease Father   . Lung cancer Father   . Cancer Father        Lung  . Heart disease Sister        CABG- Open Heart    Social History:  reports that she has been smoking cigarettes. She has a 75.00 pack-year smoking history. She has never used smokeless tobacco. She reports that she does not drink alcohol or use drugs.    Review of Systems     DEPRESSION: She is treated with Cymbalta along with nortriptyline and nortriptyline was prescribed by PCP Also takes this for neuropathy  Lipid history:  She has good control of LDL with taking pravastatin 20 mg but has high triglycerides    Lab Results  Component Value Date   CHOL 200 07/21/2018   HDL 50.80 07/21/2018   LDLCALC 64 03/17/2018   LDLDIRECT 99.0 07/21/2018   TRIG 322.0 (H) 07/21/2018   CHOLHDL 4 07/21/2018            Neurological:    Has no numbness,but does have pains , burning and tingling in feet.   Symptoms had improved with  Cymbalta  60 mg in addition  to gabapentin  She is taking gabapentin 300 mg  She is being treated with nortriptyline by PCP However she still has difficulties with pains in her legs  She has had  peripheral vascular disease and treated with Pletal from vascular surgeon  Long history of thyroid disease, hypothyroidism has been treated with 150 micrograms, 7-1/2 tablets a week  No unusual fatigue  Lab Results  Component Value Date   TSH 4.59 (H) 07/21/2018    Last  Foot exam findings: Monofilament sensation is reduced across most of the toes and distal plantar surfaces Absent pedal pulses  DIZZINESS: She was started on Florinef 3 times a week when her blood pressure was 90 systolic standing up However she still has episodes of dizziness when getting up but not as much  She is also on Lasix for history of edema which she thinks fluctuates from 1 leg to another Her last echocardiogram in  January 2020 was normal  Lab Results  Component Value Date   K 3.6 07/21/2018           Physical Examination:  BP (!) 142/68 (BP Location: Left Arm, Patient Position: Sitting, Cuff Size: Normal)   Pulse 84   Temp 99 F (37.2 C)   Ht 5\' 1"  (1.549 m)   Wt 163 lb 12.8 oz (74.3 kg)   SpO2 95%   BMI 30.95 kg/m   1+ right lower leg edema present  ASSESSMENT:   Diabetes type 2, on Omnipod insulin pump  See history of present illness for detailed discussion of current management, blood sugar patterns and problems identified  Her A1c is last 9.9 %  Her day-to-day compliance, blood sugar patterns and problems with using her pump are discussed above She does have fairly good overnight blood sugars with occasional low normal readings With her memory problems she is also having difficulty doing her boluses She does not adjust her boluses based on how much she is eating Also frequently not covering snacks or taking correction for high readings  As discussed above she blood sugar appears to be mostly high during the day and only normal or close to normal fasting when she wakes up Not clear if some of her high readings are from inadequate boluses for variable diet She is also having some technical issues with her pump  ORTHOSTATIC hypotension: Likely she has autonomic neuropathy with orthostatic hypotension stabilized with Florinef  HYPOTHYROIDISM: She will need to have her thyroid levels checked to consider adjustment of her dose, and currently her Synthroid dosage is 150 and last TSH was low normal  PLAN:    She will need enter her blood sugars more consistently in the pump She will adjust her boluses based on how much she is eating She will keep a written diary of her blood sugars and insulin boluses so that she does not forget Discussed that she can bolus right after eating also  She will cut back on sweets and snacks otherwise make sure she covers her snacks with boluses  She needs to be shown how to change her on basal rates She can be a candidate for freestyle libre but currently not checking 4 times a day consistently   Basal rate changes: 8 AM-11 AM = 1.9, 11 AM-5 PM = 2.3, 5 PM-10 PM = 2.5 and 10 PM = 2.0  We will continue Florinef 3 times a week for now since it has helped her orthostatics although she is still taking Lasix for edema of unclear etiology  NEUROLOGY consultation will be ordered because of her continued symptoms with peripheral neuropathy and complaints of memory loss  THYROID: Labs to be rechecked today  There are no Patient Instructions on file for this visit.  Counseling time on subjects discussed in assessment and plan sections is over 50% of today's 25 minute visit     Elayne Snare 09/05/2018, 10:29 AM   Note: This office note was prepared with Dragon voice recognition system technology. Any transcriptional errors that result from this process are unintentional.

## 2018-09-05 NOTE — Patient Instructions (Signed)
Call if blood sugars drop low when skipping meals.

## 2018-09-05 NOTE — Progress Notes (Signed)
Patient did not remember how to adjust the basal rate.  She was shown how to get to the basal rate program and how to make the changes.  She says that when she boluses, she counts carbs, and that she does not need help with this.  She says that sometimes she does not want to eat, and when her meals are delayed, that is when her blood sugars drop low.  I explained that we have reduced her basal rate by 0.2u/hr during the day to take care of this.  She says she thinks that this is not going to be enough.  She was encouraged to call here, to let us know if this continues to happen.  She agreed to do this.  After reviewing a few meals that she eats, she appears to be counting carbs accurately.  She had no final questions.

## 2018-09-15 ENCOUNTER — Other Ambulatory Visit: Payer: Self-pay | Admitting: Endocrinology

## 2018-09-29 ENCOUNTER — Ambulatory Visit (INDEPENDENT_AMBULATORY_CARE_PROVIDER_SITE_OTHER)
Admission: RE | Admit: 2018-09-29 | Discharge: 2018-09-29 | Disposition: A | Discharge status: Home / Self Care | Payer: Medicare Other | Source: Ambulatory Visit | Attending: Pulmonary Disease | Admitting: Pulmonary Disease

## 2018-09-29 ENCOUNTER — Other Ambulatory Visit: Payer: Self-pay

## 2018-09-29 DIAGNOSIS — R911 Solitary pulmonary nodule: Secondary | ICD-10-CM | POA: Diagnosis not present

## 2018-09-30 NOTE — Progress Notes (Signed)
CT results have come back.  Most recent CT is showing resolution of the previously seen pneumonia and pleural effusions.  This is good news.  There are no new pulmonary nodules that are concerning.  You still have a stable 5 mm right lower lobe and 4 mm left lower lobe pulmonary nodules which was seen in 2018.  Since these are stable and not increasing in size they are favored to be benign.  This is also good news.  We can review this further at next office visit.  No changes to plan of care at this time  Wyn Quaker, FNP

## 2018-10-07 ENCOUNTER — Emergency Department (HOSPITAL_COMMUNITY): Payer: Medicare Other

## 2018-10-07 ENCOUNTER — Inpatient Hospital Stay (HOSPITAL_COMMUNITY)
Admission: EM | Admit: 2018-10-07 | Discharge: 2018-10-11 | DRG: 682 | Disposition: A | Discharge status: Home Health | Payer: Medicare Other | Attending: Internal Medicine | Admitting: Internal Medicine

## 2018-10-07 ENCOUNTER — Encounter (HOSPITAL_COMMUNITY): Payer: Self-pay

## 2018-10-07 ENCOUNTER — Other Ambulatory Visit: Payer: Self-pay

## 2018-10-07 DIAGNOSIS — D649 Anemia, unspecified: Secondary | ICD-10-CM | POA: Diagnosis present

## 2018-10-07 DIAGNOSIS — N179 Acute kidney failure, unspecified: Secondary | ICD-10-CM | POA: Diagnosis not present

## 2018-10-07 DIAGNOSIS — W19XXXA Unspecified fall, initial encounter: Secondary | ICD-10-CM | POA: Insufficient documentation

## 2018-10-07 DIAGNOSIS — D631 Anemia in chronic kidney disease: Secondary | ICD-10-CM | POA: Diagnosis present

## 2018-10-07 DIAGNOSIS — R001 Bradycardia, unspecified: Secondary | ICD-10-CM | POA: Diagnosis present

## 2018-10-07 DIAGNOSIS — Z9641 Presence of insulin pump (external) (internal): Secondary | ICD-10-CM | POA: Diagnosis present

## 2018-10-07 DIAGNOSIS — Z66 Do not resuscitate: Secondary | ICD-10-CM | POA: Diagnosis present

## 2018-10-07 DIAGNOSIS — Z85528 Personal history of other malignant neoplasm of kidney: Secondary | ICD-10-CM

## 2018-10-07 DIAGNOSIS — N183 Chronic kidney disease, stage 3 unspecified: Secondary | ICD-10-CM | POA: Diagnosis present

## 2018-10-07 DIAGNOSIS — Z20828 Contact with and (suspected) exposure to other viral communicable diseases: Secondary | ICD-10-CM | POA: Diagnosis present

## 2018-10-07 DIAGNOSIS — M1990 Unspecified osteoarthritis, unspecified site: Secondary | ICD-10-CM | POA: Diagnosis present

## 2018-10-07 DIAGNOSIS — I4892 Unspecified atrial flutter: Secondary | ICD-10-CM | POA: Diagnosis present

## 2018-10-07 DIAGNOSIS — Y92009 Unspecified place in unspecified non-institutional (private) residence as the place of occurrence of the external cause: Secondary | ICD-10-CM

## 2018-10-07 DIAGNOSIS — I2510 Atherosclerotic heart disease of native coronary artery without angina pectoris: Secondary | ICD-10-CM | POA: Diagnosis present

## 2018-10-07 DIAGNOSIS — W010XXA Fall on same level from slipping, tripping and stumbling without subsequent striking against object, initial encounter: Secondary | ICD-10-CM | POA: Diagnosis present

## 2018-10-07 DIAGNOSIS — Z9181 History of falling: Secondary | ICD-10-CM

## 2018-10-07 DIAGNOSIS — E11649 Type 2 diabetes mellitus with hypoglycemia without coma: Secondary | ICD-10-CM | POA: Diagnosis present

## 2018-10-07 DIAGNOSIS — F419 Anxiety disorder, unspecified: Secondary | ICD-10-CM | POA: Diagnosis present

## 2018-10-07 DIAGNOSIS — I739 Peripheral vascular disease, unspecified: Secondary | ICD-10-CM | POA: Diagnosis present

## 2018-10-07 DIAGNOSIS — E785 Hyperlipidemia, unspecified: Secondary | ICD-10-CM | POA: Diagnosis present

## 2018-10-07 DIAGNOSIS — N189 Chronic kidney disease, unspecified: Secondary | ICD-10-CM

## 2018-10-07 DIAGNOSIS — G9340 Encephalopathy, unspecified: Secondary | ICD-10-CM

## 2018-10-07 DIAGNOSIS — F329 Major depressive disorder, single episode, unspecified: Secondary | ICD-10-CM | POA: Diagnosis present

## 2018-10-07 DIAGNOSIS — I459 Conduction disorder, unspecified: Secondary | ICD-10-CM | POA: Diagnosis present

## 2018-10-07 DIAGNOSIS — M797 Fibromyalgia: Secondary | ICD-10-CM | POA: Diagnosis present

## 2018-10-07 DIAGNOSIS — Z86010 Personal history of colonic polyps: Secondary | ICD-10-CM

## 2018-10-07 DIAGNOSIS — E1122 Type 2 diabetes mellitus with diabetic chronic kidney disease: Secondary | ICD-10-CM | POA: Diagnosis present

## 2018-10-07 DIAGNOSIS — R410 Disorientation, unspecified: Secondary | ICD-10-CM | POA: Diagnosis not present

## 2018-10-07 DIAGNOSIS — Z90710 Acquired absence of both cervix and uterus: Secondary | ICD-10-CM

## 2018-10-07 DIAGNOSIS — Z7982 Long term (current) use of aspirin: Secondary | ICD-10-CM

## 2018-10-07 DIAGNOSIS — Z86718 Personal history of other venous thrombosis and embolism: Secondary | ICD-10-CM

## 2018-10-07 DIAGNOSIS — I10 Essential (primary) hypertension: Secondary | ICD-10-CM | POA: Diagnosis present

## 2018-10-07 DIAGNOSIS — I129 Hypertensive chronic kidney disease with stage 1 through stage 4 chronic kidney disease, or unspecified chronic kidney disease: Secondary | ICD-10-CM | POA: Diagnosis present

## 2018-10-07 DIAGNOSIS — E119 Type 2 diabetes mellitus without complications: Secondary | ICD-10-CM | POA: Diagnosis not present

## 2018-10-07 DIAGNOSIS — E1151 Type 2 diabetes mellitus with diabetic peripheral angiopathy without gangrene: Secondary | ICD-10-CM | POA: Diagnosis present

## 2018-10-07 DIAGNOSIS — Z79890 Hormone replacement therapy (postmenopausal): Secondary | ICD-10-CM

## 2018-10-07 DIAGNOSIS — E039 Hypothyroidism, unspecified: Secondary | ICD-10-CM | POA: Diagnosis present

## 2018-10-07 DIAGNOSIS — E860 Dehydration: Secondary | ICD-10-CM | POA: Diagnosis present

## 2018-10-07 DIAGNOSIS — K219 Gastro-esophageal reflux disease without esophagitis: Secondary | ICD-10-CM | POA: Diagnosis present

## 2018-10-07 DIAGNOSIS — F17210 Nicotine dependence, cigarettes, uncomplicated: Secondary | ICD-10-CM | POA: Diagnosis present

## 2018-10-07 DIAGNOSIS — J449 Chronic obstructive pulmonary disease, unspecified: Secondary | ICD-10-CM | POA: Diagnosis present

## 2018-10-07 DIAGNOSIS — Y9301 Activity, walking, marching and hiking: Secondary | ICD-10-CM | POA: Diagnosis present

## 2018-10-07 DIAGNOSIS — E1142 Type 2 diabetes mellitus with diabetic polyneuropathy: Secondary | ICD-10-CM | POA: Diagnosis present

## 2018-10-07 DIAGNOSIS — S82831A Other fracture of upper and lower end of right fibula, initial encounter for closed fracture: Secondary | ICD-10-CM | POA: Diagnosis present

## 2018-10-07 DIAGNOSIS — Z8673 Personal history of transient ischemic attack (TIA), and cerebral infarction without residual deficits: Secondary | ICD-10-CM

## 2018-10-07 DIAGNOSIS — G92 Toxic encephalopathy: Secondary | ICD-10-CM | POA: Diagnosis present

## 2018-10-07 DIAGNOSIS — Z794 Long term (current) use of insulin: Secondary | ICD-10-CM

## 2018-10-07 DIAGNOSIS — T4275XA Adverse effect of unspecified antiepileptic and sedative-hypnotic drugs, initial encounter: Secondary | ICD-10-CM | POA: Diagnosis present

## 2018-10-07 DIAGNOSIS — Z8249 Family history of ischemic heart disease and other diseases of the circulatory system: Secondary | ICD-10-CM

## 2018-10-07 DIAGNOSIS — Z9981 Dependence on supplemental oxygen: Secondary | ICD-10-CM

## 2018-10-07 DIAGNOSIS — Z79899 Other long term (current) drug therapy: Secondary | ICD-10-CM

## 2018-10-07 LAB — RAPID URINE DRUG SCREEN, HOSP PERFORMED
Amphetamines: NOT DETECTED
Barbiturates: NOT DETECTED
Benzodiazepines: NOT DETECTED
Cocaine: NOT DETECTED
Opiates: POSITIVE — AB
Tetrahydrocannabinol: NOT DETECTED

## 2018-10-07 LAB — URINALYSIS, ROUTINE W REFLEX MICROSCOPIC
Bilirubin Urine: NEGATIVE
Glucose, UA: 50 mg/dL — AB
Hgb urine dipstick: NEGATIVE
Ketones, ur: NEGATIVE mg/dL
Leukocytes,Ua: NEGATIVE
Nitrite: NEGATIVE
Protein, ur: NEGATIVE mg/dL
Specific Gravity, Urine: 1.018 (ref 1.005–1.030)
pH: 5 (ref 5.0–8.0)

## 2018-10-07 LAB — CBC WITH DIFFERENTIAL/PLATELET
Abs Immature Granulocytes: 0.1 10*3/uL — ABNORMAL HIGH (ref 0.00–0.07)
Basophils Absolute: 0 10*3/uL (ref 0.0–0.1)
Basophils Relative: 0 %
Eosinophils Absolute: 0 10*3/uL (ref 0.0–0.5)
Eosinophils Relative: 0 %
HCT: 34.4 % — ABNORMAL LOW (ref 36.0–46.0)
Hemoglobin: 11.1 g/dL — ABNORMAL LOW (ref 12.0–15.0)
Immature Granulocytes: 1 %
Lymphocytes Relative: 11 %
Lymphs Abs: 1.7 10*3/uL (ref 0.7–4.0)
MCH: 27.5 pg (ref 26.0–34.0)
MCHC: 32.3 g/dL (ref 30.0–36.0)
MCV: 85.1 fL (ref 80.0–100.0)
Monocytes Absolute: 0.7 10*3/uL (ref 0.1–1.0)
Monocytes Relative: 5 %
Neutro Abs: 12.3 10*3/uL — ABNORMAL HIGH (ref 1.7–7.7)
Neutrophils Relative %: 83 %
Platelets: 298 10*3/uL (ref 150–400)
RBC: 4.04 MIL/uL (ref 3.87–5.11)
RDW: 14.7 % (ref 11.5–15.5)
WBC: 14.9 10*3/uL — ABNORMAL HIGH (ref 4.0–10.5)
nRBC: 0 % (ref 0.0–0.2)

## 2018-10-07 LAB — SARS CORONAVIRUS 2 BY RT PCR (HOSPITAL ORDER, PERFORMED IN ~~LOC~~ HOSPITAL LAB): SARS Coronavirus 2: NEGATIVE

## 2018-10-07 LAB — BLOOD GAS, ARTERIAL
Acid-base deficit: 3.2 mmol/L — ABNORMAL HIGH (ref 0.0–2.0)
Bicarbonate: 21.5 mmol/L (ref 20.0–28.0)
Drawn by: 441261
O2 Content: 2 L/min
O2 Saturation: 93.5 %
Patient temperature: 98.6
pCO2 arterial: 40 mmHg (ref 32.0–48.0)
pH, Arterial: 7.35 (ref 7.350–7.450)
pO2, Arterial: 75.6 mmHg — ABNORMAL LOW (ref 83.0–108.0)

## 2018-10-07 LAB — COMPREHENSIVE METABOLIC PANEL
ALT: 17 U/L (ref 0–44)
AST: 21 U/L (ref 15–41)
Albumin: 3.6 g/dL (ref 3.5–5.0)
Alkaline Phosphatase: 89 U/L (ref 38–126)
Anion gap: 13 (ref 5–15)
BUN: 35 mg/dL — ABNORMAL HIGH (ref 8–23)
CO2: 21 mmol/L — ABNORMAL LOW (ref 22–32)
Calcium: 8.6 mg/dL — ABNORMAL LOW (ref 8.9–10.3)
Chloride: 104 mmol/L (ref 98–111)
Creatinine, Ser: 2.68 mg/dL — ABNORMAL HIGH (ref 0.44–1.00)
GFR calc Af Amer: 20 mL/min — ABNORMAL LOW (ref >60)
GFR calc non Af Amer: 17 mL/min — ABNORMAL LOW (ref >60)
Glucose, Bld: 68 mg/dL — ABNORMAL LOW (ref 70–99)
Potassium: 3.4 mmol/L — ABNORMAL LOW (ref 3.5–5.1)
Sodium: 138 mmol/L (ref 135–145)
Total Bilirubin: 0.1 mg/dL — ABNORMAL LOW (ref 0.3–1.2)
Total Protein: 7.3 g/dL (ref 6.5–8.1)

## 2018-10-07 LAB — TROPONIN I (HIGH SENSITIVITY): Troponin I (High Sensitivity): 10 ng/L (ref <18)

## 2018-10-07 LAB — AMMONIA: Ammonia: 17 umol/L (ref 9–35)

## 2018-10-07 LAB — CBG MONITORING, ED: Glucose-Capillary: 98 mg/dL (ref 70–99)

## 2018-10-07 MED ORDER — ACETAMINOPHEN 325 MG PO TABS
650.0000 mg | ORAL_TABLET | Freq: Four times a day (QID) | ORAL | Status: DC | PRN
  Administered 2018-10-10: 650 mg via ORAL
  Filled 2018-10-07: qty 2

## 2018-10-07 MED ORDER — DULOXETINE HCL 30 MG PO CPEP
60.0000 mg | ORAL_CAPSULE | Freq: Every day | ORAL | Status: DC
  Administered 2018-10-08 – 2018-10-11 (×4): 60 mg via ORAL
  Filled 2018-10-07 (×5): qty 2

## 2018-10-07 MED ORDER — LEVOTHYROXINE SODIUM 150 MCG PO TABS
150.0000 ug | ORAL_TABLET | Freq: Every day | ORAL | Status: DC
  Administered 2018-10-09 – 2018-10-11 (×3): 150 ug via ORAL
  Filled 2018-10-07 (×3): qty 1

## 2018-10-07 MED ORDER — METOPROLOL SUCCINATE ER 25 MG PO TB24
25.0000 mg | ORAL_TABLET | Freq: Every day | ORAL | Status: DC
  Administered 2018-10-08 – 2018-10-10 (×3): 25 mg via ORAL
  Filled 2018-10-07 (×3): qty 1

## 2018-10-07 MED ORDER — ASPIRIN EC 325 MG PO TBEC
325.0000 mg | DELAYED_RELEASE_TABLET | Freq: Every day | ORAL | Status: DC
  Administered 2018-10-08 – 2018-10-11 (×4): 325 mg via ORAL
  Filled 2018-10-07 (×4): qty 1

## 2018-10-07 MED ORDER — FERROUS SULFATE 325 (65 FE) MG PO TABS
325.0000 mg | ORAL_TABLET | Freq: Every day | ORAL | Status: DC
  Administered 2018-10-08 – 2018-10-11 (×4): 325 mg via ORAL
  Filled 2018-10-07 (×4): qty 1

## 2018-10-07 MED ORDER — ACETAMINOPHEN 650 MG RE SUPP: 650.0000 mg | Freq: Four times a day (QID) | RECTAL | Status: DC | PRN

## 2018-10-07 MED ORDER — DEXTROSE 50 % IV SOLN: 25.0000 mL | Freq: Once | INTRAVENOUS | Status: DC

## 2018-10-07 MED ORDER — ALBUTEROL SULFATE HFA 108 (90 BASE) MCG/ACT IN AERS
1.0000 | INHALATION_SPRAY | Freq: Four times a day (QID) | RESPIRATORY_TRACT | Status: DC | PRN
  Filled 2018-10-07: qty 6.7

## 2018-10-07 MED ORDER — PANTOPRAZOLE SODIUM 20 MG PO TBEC
20.0000 mg | DELAYED_RELEASE_TABLET | Freq: Every day | ORAL | Status: DC
  Administered 2018-10-08 – 2018-10-11 (×4): 20 mg via ORAL
  Filled 2018-10-07 (×4): qty 1

## 2018-10-07 MED ORDER — ENOXAPARIN SODIUM 30 MG/0.3ML ~~LOC~~ SOLN
30.0000 mg | SUBCUTANEOUS | Status: DC
  Administered 2018-10-08 – 2018-10-09 (×2): 30 mg via SUBCUTANEOUS
  Filled 2018-10-07 (×2): qty 0.3

## 2018-10-07 MED ORDER — ONDANSETRON HCL 4 MG PO TABS: 4.0000 mg | ORAL_TABLET | Freq: Four times a day (QID) | ORAL | Status: DC | PRN

## 2018-10-07 MED ORDER — SODIUM CHLORIDE 0.9 % IV BOLUS: 1000.0000 mL | Freq: Once | INTRAVENOUS | Status: DC

## 2018-10-07 MED ORDER — ONDANSETRON HCL 4 MG/2ML IJ SOLN
4.0000 mg | Freq: Four times a day (QID) | INTRAMUSCULAR | Status: DC | PRN
  Administered 2018-10-09: 4 mg via INTRAVENOUS
  Filled 2018-10-07: qty 2

## 2018-10-07 MED ORDER — CILOSTAZOL 100 MG PO TABS
100.0000 mg | ORAL_TABLET | Freq: Two times a day (BID) | ORAL | Status: DC
  Administered 2018-10-08 – 2018-10-11 (×8): 100 mg via ORAL
  Filled 2018-10-07 (×9): qty 1

## 2018-10-07 NOTE — H&P (Signed)
History and Physical    April Branch:[id number] DOB: 1945-05-26 DOA: 10/07/2018  PCP: [name], MD  Patient coming from: Home.  Chief Complaint: Fall.  Confusion.  HPI: April Branch is a 73 y.o. female with history of chronic kidney disease stage III baseline creatinine around 1.4, hypertension, diabetes mellitus type 2 on insulin pump, chronic anemia, chronic pain and interstitial lung disease and pulmonary nodule being followed by pulmonologist had a recent CAT scan was brought to the ER patient was found to have multiple falls.  Patient states she tripped and fell while walking.  Her neighbor called EMS and was brought to the ER.  Per ER physician patient also was confused.  ED Course: In the ER patient had a CT head and C-spine which were unremarkable.  EKG shows normal sinus rhythm intraventricular conduction delay.  X-ray of the right ankle was done which since patient is complaining of ankle pain which shows distal fibula fracture and patient was placed on a splint.  Urine drugs show positive for opiates.  Patient is mildly confused and was attributed to likely patient taking opiates and gabapentin in the setting of acute renal failure.  Patient also was hypoglycemic and patient's insulin pump was discontinued.  Patient was given fluids D50 and admitted for acute encephalopathy right fibula fracture acute renal failure.  Review of Systems: As per HPI, rest all negative.   Past Medical History:  Diagnosis Date   Anemia    Anxiety    Arthritis    CAD (coronary artery disease) 2016   non-obstructive at cath   Colon polyps 09/11/2010   Tubular adenoma   COPD 12/14/2008   PATIENT DENIES     Depression    Diabetic coma ([place]) Feb. 2014   Fall at home 2016   x 2 in January 2016, Left knee   Fibromyalgia    GERD 07/20/2006   Headache(784.0)    History of transient ischemic attack (TIA)    HPV (human papilloma virus) infection    Hyperlipidemia      Hypertension    Hypothyroidism    Insulin dependent type 2 diabetes mellitus, controlled ([place]) 1989   Lumbar disc disease    Neuromuscular disorder ([place])    PERIPHERAL NEUROPATHY   Peripheral vascular disease ([place])    Personal history of malignant neoplasm of kidney(V10.52) 12/14/2008   Laparoscopic biopsy and cryoablation 7/08 Dr. Vernie Shanks    Renal disorder    chronic kidney disease    Stroke [facility])    3 MINI STROKES  RT SIDED WEAKNESS   Vertigo     Past Surgical History:  Procedure Laterality Date   ABDOMINAL AORTAGRAM N/A 08/21/2011   Procedure: ABDOMINAL Maxcine Ham;  Surgeon: Elam Dutch, MD;  Location[facility] CATH LAB;  Service: Cardiovascular;  Laterality: N/A;   ABDOMINAL AORTAGRAM N/A 03/16/2014   Procedure: ABDOMINAL Maxcine Ham;  Surgeon: Elam Dutch, MD;  Location: [facility] CATH LAB;  Service: Cardiovascular;  Laterality: N/A;   ABDOMINAL HYSTERECTOMY     APPENDECTOMY     BLADDER SUSPENSION     CARDIAC CATHETERIZATION     CHOLECYSTECTOMY OPEN     ESOPHAGOGASTRODUODENOSCOPY (EGD) WITH PROPOFOL N/A 05/16/2014   Procedure: ESOPHAGOGASTRODUODENOSCOPY (EGD) WITH PROPOFOL with Balloon dilation;  Surgeon: Milus Banister, MD;  Location: [place];  Service: Endoscopy;  Laterality: N/A;   ESOPHAGOGASTRODUODENOSCOPY (EGD) WITH PROPOFOL N/A 09/02/2015   Procedure: ESOPHAGOGASTRODUODENOSCOPY (EGD) WITH PROPOFOL;  Surgeon: Doran Stabler, MD;  Location: [place];  Service: Gastroenterology;  Laterality: N/A;   FEMORAL-POPLITEAL BYPASS GRAFT  10/12/2011   Procedure: BYPASS GRAFT FEMORAL-POPLITEAL ARTERY;  Surgeon: Elam Dutch, MD;  Location: [facility] OR;  Service: Vascular;  Laterality: Left;  Left Femoral-Popliteal Bypass Graft using 66mm x 80cm Propaten Graft with intraop arteriogram times one.   FEMORAL-POPLITEAL BYPASS GRAFT Left 04/17/2014   Procedure: REDO LEFT FEMORAL-POPLITEAL ARTERY BYPASS USING GORE PROPATEN 61mmx80cm GRAFT;  Surgeon: Elam Dutch,  MD;  Location: [place];  Service: Vascular;  Laterality: Left;   FOOT SURGERY Left    "bone spurs"   INTRAOPERATIVE ARTERIOGRAM  10/12/2011   Procedure: INTRA OPERATIVE ARTERIOGRAM;  Surgeon: Elam Dutch, MD;  Location: [facility] OR;  Service: Vascular;  Laterality: Left;   KNEE ARTHROSCOPY Bilateral    "cartilage"   Laparascopic cryoablation of left kidney  08/2006   Dr. Gladis Riffle for renal cell cancer   LEFT HEART CATHETERIZATION WITH CORONARY ANGIOGRAM N/A 05/11/2011   Procedure: LEFT HEART CATHETERIZATION WITH CORONARY ANGIOGRAM;  Surgeon: Jolaine Artist, MD;  Location: [facility] CATH LAB;  Service: Cardiovascular;  Laterality: N/A;   LUNG SURGERY Right    lung nodule removed from the right side   PR VEIN BYPASS GRAFT,AORTO-FEM-POP  05/27/2010   SHOULDER ARTHROSCOPY Bilateral    'Spurs"   TUBAL LIGATION       reports that she has been smoking cigarettes. She has a 75.00 pack-year smoking history. She has never used smokeless tobacco. She reports that she does not drink alcohol or use drugs.  No Known Allergies  Family History  Problem Relation Age of Onset   Heart disease Mother    Lung cancer Mother    Cancer Mother        Lung   Heart disease Father    Lung cancer Father    Cancer Father        Lung   Heart disease Sister        CABG- Open Heart    Prior to Admission medications   Medication Sig Start Date End Date Taking? Authorizing Provider  ACCU-CHEK FASTCLIX LANCETS MISC Use 4 per day to check blood sugar dx code E11.9 11/29/14   Elayne Snare, MD  albuterol (PROVENTIL HFA;VENTOLIN HFA) 108 (90 Base) MCG/ACT inhaler Inhale 1-2 puffs into the lungs every 6 (six) hours as needed for wheezing or shortness of breath. Patient not taking: Reported on 08/15/2018 04/07/18   Raiford Noble Latif, DO  aspirin EC 325 MG EC tablet Take 1 tablet (325 mg total) by mouth daily. 03/21/18   Hongalgi, Lenis Dickinson, MD  cilostazol (PLETAL) 100 MG tablet TAKE 1 TABLET BY MOUTH TWICE A  DAY Patient taking differently: Take 100 mg by mouth 2 (two) times daily.  08/25/18   Waynetta Sandy, MD  DULoxetine (CYMBALTA) 60 MG capsule TAKE 1 CAPSULE BY MOUTH ONCE DAILY 08/12/18   Elayne Snare, MD  ferrous sulfate 325 (65 FE) MG tablet Take 325 mg by mouth daily with breakfast.     [provider]  fludrocortisone (FLORINEF) 0.1 MG tablet TAKE 1 TABLET BY MOUTH 3 TIMES A WEEK Patient not taking: Reported on 08/15/2018 04/20/18   Elayne Snare, MD  furosemide (LASIX) 20 MG tablet Take 1 tablet (20 mg total) by mouth daily. Patient taking differently: Take 60 mg by mouth daily.  03/20/18 03/20/19  Hongalgi, Lenis Dickinson, MD  gabapentin (NEURONTIN) 300 MG capsule TAKE 1 CAPSULE BY MOUTH TWICE DAILY Patient taking differently: Take 300 mg by mouth 2 (two)  times daily.  06/16/18   Elayne Snare, MD  glucose blood (CONTOUR NEXT TEST) test strip Use to test blood sugar 3 times daily E11.9 09/05/18   Elayne Snare, MD  HYDROcodone-acetaminophen [facility]) 10-325 MG tablet Take 1 tablet by mouth 3 (three) times daily as needed (for pain). Follow up with PCP if refills needed 04/24/17   Aline August, MD  hydrOXYzine (ATARAX/VISTARIL) 25 MG tablet Take 1 tablet (25 mg total) by mouth every 6 (six) hours. Patient not taking: Reported on 08/15/2018 10/27/17   Jacqlyn Larsen, PA-C  insulin lispro (HUMALOG) 100 UNIT/ML injection Inject 0.7 mLs (70 Units total) into the skin See admin instructions. Use 200 units every 3 days in insulin pump 09/15/18   Elayne Snare, MD  Insulin Pen Needle (BD PEN NEEDLE NANO U/F) 32G X 4 MM MISC Use to inject insulin 10/30/14   Elayne Snare, MD  ipratropium-albuterol (DUONEB) 0.5-2.5 (3) MG/3ML SOLN Take 3 mLs by nebulization every 6 (six) hours as needed. Patient not taking: Reported on 08/15/2018 04/18/18   Rigoberto Noel, MD  lansoprazole (PREVACID) 30 MG capsule Take 1 capsule (30 mg total) by mouth daily. 05/11/16   Geradine Girt, DO  levothyroxine (SYNTHROID, LEVOTHROID) 150 MCG  tablet TAKE 1 TABLET BY MOUTH ONCE DAILY Patient taking differently: Take 150 mcg by mouth daily.  04/22/18   Elayne Snare, MD  metoprolol succinate (TOPROL-XL) 25 MG 24 hr tablet TAKE 1 TABLET BY MOUTH EVERY DAY Patient taking differently: Take 25 mg by mouth daily.  02/01/18   Imogene Burn, PA-C  nortriptyline (PAMELOR) 75 MG capsule Take 75 mg by mouth at bedtime. 01/24/14   [provider]  nystatin (MYCOSTATIN) 100000 UNIT/ML suspension Take 5 mLs (500,000 Units total) by mouth 4 (four) times daily. Patient not taking: Reported on 09/05/2018 06/21/18   Lauraine Rinne, NP  OXYGEN Inhale 1 L into the lungs as needed (shortness of breath).    [provider]  potassium chloride (K-DUR) 10 MEQ tablet TAKE 1 TABLET BY MOUTH TWICE DAILY Patient taking differently: Take 10 mEq by mouth 2 (two) times daily.  06/16/18   Elayne Snare, MD  predniSONE (DELTASONE) 10 MG tablet TAKE 1 TABLET BY MOUTH EVERY DAY Patient not taking: Reported on 08/15/2018 07/28/18   Rigoberto Noel, MD  QUEtiapine (SEROQUEL) 100 MG tablet Take 100 mg by mouth at bedtime. 09/25/16   [provider]  tolterodine (DETROL LA) 4 MG 24 hr capsule Take 4 mg by mouth daily.     [provider]  triamcinolone cream (KENALOG) 0.1 % Apply 1 application topically 2 (two) times daily. Patient not taking: Reported on 10/07/2018 12/10/17   Kinnie Feil, PA-C  Vitamin D, Ergocalciferol, (DRISDOL) 50000 units CAPS capsule Take 50,000 Units by mouth every Monday.  09/18/16   [provider]    Physical Exam: Constitutional: Moderately built and nourished. Vitals:   10/07/18 1830 10/07/18 1916  BP: (!) 121/103 110/89  Pulse: 80 100  Resp: 20 18  Temp: 98.5 F (36.9 C)   TempSrc: Oral   SpO2: 96% 94%   Eyes: Anicteric no pallor. ENMT: No discharge from the ears eyes nose and mouth. Neck: No mass felt.  No neck rigidity. Respiratory: No rhonchi or crepitations. Cardiovascular: S1-S2  heard. Abdomen: Soft nontender bowel sounds present. Musculoskeletal: No edema.  Right ankle is in splint. Skin: No rash. Neurologic: Alert awake oriented to her name and place moves all extremities. Psychiatric: Oriented to  her name and place.   Labs on Admission: I have personally reviewed following labs and imaging studies  CBC: Recent Labs  Lab 10/07/18 1904  WBC 14.9*  NEUTROABS 12.3*  HGB 11.1*  HCT 34.4*  MCV 85.1  PLT 742   Basic Metabolic Panel: Recent Labs  Lab 10/07/18 1904  NA 138  K 3.4*  CL 104  CO2 21*  GLUCOSE 68*  BUN 35*  CREATININE 2.68*  CALCIUM 8.6*   GFR: CrCl cannot be calculated (Unknown ideal weight.). Liver Function Tests: Recent Labs  Lab 10/07/18 1904  AST 21  ALT 17  ALKPHOS 89  BILITOT 0.1*  PROT 7.3  ALBUMIN 3.6   No results for input(s): LIPASE, AMYLASE in the last 168 hours. Recent Labs  Lab 10/07/18 1904  AMMONIA 17   Coagulation Profile: No results for input(s): INR, PROTIME in the last 168 hours. Cardiac Enzymes: No results for input(s): CKTOTAL, CKMB, CKMBINDEX, TROPONINI in the last 168 hours. BNP (last 3 results) No results for input(s): PROBNP in the last 8760 hours. HbA1C: No results for input(s): HGBA1C in the last 72 hours. CBG: Recent Labs  Lab 10/07/18 2146  GLUCAP 98   Lipid Profile: No results for input(s): CHOL, HDL, LDLCALC, TRIG, CHOLHDL, LDLDIRECT in the last 72 hours. Thyroid Function Tests: No results for input(s): TSH, T4TOTAL, FREET4, T3FREE, THYROIDAB in the last 72 hours. Anemia Panel: No results for input(s): VITAMINB12, FOLATE, FERRITIN, TIBC, IRON, RETICCTPCT in the last 72 hours. Urine analysis:    Component Value Date/Time   COLORURINE YELLOW 10/07/2018 1904   APPEARANCEUR CLEAR 10/07/2018 1904   LABSPEC 1.018 10/07/2018 1904   PHURINE 5.0 10/07/2018 1904   GLUCOSEU 50 (A) 10/07/2018 1904   GLUCOSEU 250 (A) 08/17/2017 1411   HGBUR NEGATIVE 10/07/2018 1904   BILIRUBINUR  NEGATIVE 10/07/2018 1904   KETONESUR NEGATIVE 10/07/2018 1904   PROTEINUR NEGATIVE 10/07/2018 1904   UROBILINOGEN 0.2 08/17/2017 1411   NITRITE NEGATIVE 10/07/2018 1904   LEUKOCYTESUR NEGATIVE 10/07/2018 1904   Sepsis Labs: @LABRCNTIP (procalcitonin:4,lacticidven:4) ) Recent Results (from the past 240 hour(s))  SARS Coronavirus 2 [facility] order, Performed in [facility] hospital lab) Nasopharyngeal Nasopharyngeal Swab     Status: None   Collection Time: 10/07/18  7:04 PM   Specimen: Nasopharyngeal Swab  Result Value Ref Range Status   SARS Coronavirus 2 NEGATIVE NEGATIVE Final    Comment: (NOTE) If result is NEGATIVE SARS-CoV-2 target nucleic acids are NOT DETECTED. The SARS-CoV-2 RNA is generally detectable in upper and lower  respiratory specimens during the acute phase of infection. The lowest  concentration of SARS-CoV-2 viral copies this assay can detect is 250  copies / mL. A negative result does not preclude SARS-CoV-2 infection  and should not be used as the sole basis for treatment or other  patient management decisions.  A negative result may occur with  improper specimen collection / handling, submission of specimen other  than nasopharyngeal swab, presence of viral mutation(s) within the  areas targeted by this assay, and inadequate number of viral copies  (<250 copies / mL). A negative result must be combined with clinical  observations, patient history, and epidemiological information. If result is POSITIVE SARS-CoV-2 target nucleic acids are DETECTED. The SARS-CoV-2 RNA is generally detectable in upper and lower  respiratory specimens dur ing the acute phase of infection.  Positive  results are indicative of active infection with SARS-CoV-2.  Clinical  correlation with patient history and other diagnostic information is  necessary to  determine patient infection status.  Positive results do  not rule out bacterial infection or co-infection with other viruses. If  result is PRESUMPTIVE POSTIVE SARS-CoV-2 nucleic acids MAY BE PRESENT.   A presumptive positive result was obtained on the submitted specimen  and confirmed on repeat testing.  While 2019 novel coronavirus  (SARS-CoV-2) nucleic acids may be present in the submitted sample  additional confirmatory testing may be necessary for epidemiological  and / or clinical management purposes  to differentiate between  SARS-CoV-2 and other Sarbecovirus currently known to infect humans.  If clinically indicated additional testing with an alternate test  methodology [phone]) is advised. The SARS-CoV-2 RNA is generally  detectable in upper and lower respiratory sp ecimens during the acute  phase of infection. The expected result is Negative. Fact Sheet for Patients:  StrictlyIdeas.no Fact Sheet for Healthcare Providers: BankingDealers.co.za This test is not yet approved or cleared by the [place] FDA and has been authorized for detection and/or diagnosis of SARS-CoV-2 by FDA under an Emergency Use Authorization (EUA).  This EUA will remain in effect (meaning this test can be used) for the duration of the COVID-19 declaration under Section 564(b)(1) of the Act, 21 U.S.C. section 360bbb-3(b)(1), unless the authorization is terminated or revoked sooner. Performed at [facility], [place] [address], [place], [place] [address]      Radiological Exams on Admission: Dg Chest 2 View  Result Date: 10/07/2018 CLINICAL DATA:  Weakness EXAM: CHEST - 2 VIEW COMPARISON:  08/15/2018, CT 09/29/2018 FINDINGS: Cardiomegaly with vascular congestion. No pleural effusion or pneumothorax. Aortic atherosclerosis. Vague nodular opacity in the right mid to upper lung is noted, no correlate on CT chest follow-up 09/29/2018. IMPRESSION: Cardiomegaly with vascular congestion Electronically Signed   By: Donavan Foil M.D.   On: 10/07/2018 19:48   Dg Ankle  Complete Right  Result Date: 10/07/2018 CLINICAL DATA:  Weakness, fall EXAM: RIGHT ANKLE - COMPLETE 3+ VIEW COMPARISON:  None. FINDINGS: Acute oblique fracture distal shaft of the fibula above the ankle joint. Minimal less than 1/4 bone with lateral displacement distal fracture fragment. Ankle mortise is symmetric. Plantar calcaneal spur. IMPRESSION: Sure acute minimally displaced distal fibular fracture Electronically Signed   By: Donavan Foil M.D.   On: 10/07/2018 19:49   Ct Head Wo Contrast  Result Date: 10/07/2018 CLINICAL DATA:  Ataxia, post head trauma; C-spine fx, traumatic. Multiple falls. Weakness. EXAM: CT HEAD WITHOUT CONTRAST CT CERVICAL SPINE WITHOUT CONTRAST TECHNIQUE: Multidetector CT imaging of the head and cervical spine was performed following the standard protocol without intravenous contrast. Multiplanar CT image reconstructions of the cervical spine were also generated. COMPARISON:  Head CT 03/16/2018 FINDINGS: CT HEAD FINDINGS Brain: No evidence of acute infarction, hemorrhage, hydrocephalus, extra-axial collection or mass lesion/mass effect. Age related atrophy. Similar chronic small vessel ischemia. Vascular: Atherosclerosis of skullbase vasculature without hyperdense vessel or abnormal calcification. Skull: No fracture or focal lesion. Sinuses/Orbits: Postsurgical change in the right maxillary sinus. No acute findings. Bilateral cataract resection. Other: None. CT CERVICAL SPINE FINDINGS Alignment: Normal. Skull base and vertebrae: No acute fracture. Vertebral body heights are maintained. The dens and skull base are intact. Non fusion posterior arch of C1, normal variant. Soft tissues and spinal canal: No prevertebral fluid or swelling. No visible canal hematoma. Disc levels: Disc space narrowing and endplate spurring most prominent at C4-C5. Scattered minor facet hypertrophy. Upper chest: No acute findings, allowing for motion artifact. Other: Carotid calcifications. IMPRESSION: 1.  No acute intracranial abnormality. No  skull fracture. Stable atrophy and chronic small vessel ischemia. 2. Mild degenerative change in the cervical spine without acute fracture or subluxation. 3. Carotid and skullbase atherosclerosis. Electronically Signed   By: Keith Rake M.D.   On: 10/07/2018 20:47   Ct Cervical Spine Wo Contrast  Result Date: 10/07/2018 CLINICAL DATA:  Ataxia, post head trauma; C-spine fx, traumatic. Multiple falls. Weakness. EXAM: CT HEAD WITHOUT CONTRAST CT CERVICAL SPINE WITHOUT CONTRAST TECHNIQUE: Multidetector CT imaging of the head and cervical spine was performed following the standard protocol without intravenous contrast. Multiplanar CT image reconstructions of the cervical spine were also generated. COMPARISON:  Head CT 03/16/2018 FINDINGS: CT HEAD FINDINGS Brain: No evidence of acute infarction, hemorrhage, hydrocephalus, extra-axial collection or mass lesion/mass effect. Age related atrophy. Similar chronic small vessel ischemia. Vascular: Atherosclerosis of skullbase vasculature without hyperdense vessel or abnormal calcification. Skull: No fracture or focal lesion. Sinuses/Orbits: Postsurgical change in the right maxillary sinus. No acute findings. Bilateral cataract resection. Other: None. CT CERVICAL SPINE FINDINGS Alignment: Normal. Skull base and vertebrae: No acute fracture. Vertebral body heights are maintained. The dens and skull base are intact. Non fusion posterior arch of C1, normal variant. Soft tissues and spinal canal: No prevertebral fluid or swelling. No visible canal hematoma. Disc levels: Disc space narrowing and endplate spurring most prominent at C4-C5. Scattered minor facet hypertrophy. Upper chest: No acute findings, allowing for motion artifact. Other: Carotid calcifications. IMPRESSION: 1. No acute intracranial abnormality. No skull fracture. Stable atrophy and chronic small vessel ischemia. 2. Mild degenerative change in the cervical spine without  acute fracture or subluxation. 3. Carotid and skullbase atherosclerosis. Electronically Signed   By: Keith Rake M.D.   On: 10/07/2018 20:47    EKG: Independently reviewed.  Normal sinus rhythm.  Assessment/Plan Principal Problem:   Acute encephalopathy Active Problems:   History of malignant neoplasm of kidney   Hypothyroidism   Insulin dependent type 2 diabetes mellitus, controlled (HCC)   Essential hypertension   COPD (chronic obstructive pulmonary disease) (HCC)   HX, PERSONAL, VENOUS THROMBOSIS/EMBOLISM   CAD (coronary artery disease)   PVD (peripheral vascular disease) (HCC)   Acute kidney injury superimposed on CKD (HCC)   Anemia   Bradycardia   CKD (chronic kidney disease), stage III ([place])    1. Acute encephalopathy -has improved by the time I examined.  Likely due to patient taking opiates and gabapentin in the setting of worsening renal function.  I also held patient Seroquel dose tonight.  Patient also was hypoglycemic which could be contributing. 2. Right fibula fracture -on splint.  Please discuss with orthopedics in the morning. 3. Acute on chronic kidney disease stage III -cause not clear.  UA is unremarkable.  Patient's medication list states that patient was on Bactrim which could contribute.  I am unable to reach patient's family for further history.  At this time will hold off patient's Lasix gently hydrate. 4. Hypoglycemia in the setting of worsening renal function -holding off patient's insulin pump check CBGs q. 2 hourly.  Check cortisol levels and patient has been on prednisone for a long time. 5. COPD not actively wheezing. 6. Hypothyroidism on Synthroid. 7. Peripheral vascular disease on Pletal. 8. Anemia appears to be chronic follow CBC. 9. Hypertension on metoprolol and amlodipine.   Note that I have held the patient's insulin pump gabapentin hydrocodone and Seroquel.  Once patient is more alert awake need to restart this medication and insulin can be  restarted once blood sugar is more stable.  We will need to get further history once patient's family is available.   DVT prophylaxis: Lovenox. Code Status: DNR. Family Communication: Unable to reach patient's son with whom patient lives. Disposition Plan: To be determined. Consults called: None. Admission status: Observation.   Rise Patience MD [name] Pager [phone].  If 7PM-7AM, please contact night-coverage [email or web address] Password [facility]  10/07/2018, 9:49 PM

## 2018-10-07 NOTE — ED Triage Notes (Signed)
BIB [place] [facility] from home family reports "multiple falls" throughout day no LOC or taken blood thinners. Pt states feeling weak. PT report right ankle pain.  Pt has not eaten since breakfast. PT normal on 2L and has been off for some time. Currently on 2L 95%   110/70 85 cbg 78  20 L FA- 282ml

## 2018-10-07 NOTE — ED Provider Notes (Signed)
[facility] DEPT Provider Note   CSN: [id number] Arrival date & time: 10/07/18  1707     History   Chief Complaint No chief complaint on file.   HPI April Branch is a 73 y.o. female.     Patient has been having several falls today.  Patient also complains of upper back and right ankle pain.  Patient has a history of diabetes and hypertension chronic back pain COPD coronary artery disease  The history is provided by the patient. No language interpreter was used.  Fall This is a new problem. The current episode started 3 to 5 hours ago. The problem occurs constantly. The problem has not changed since onset.Pertinent negatives include no chest pain, no abdominal pain and no headaches. Nothing aggravates the symptoms. Nothing relieves the symptoms. She has tried nothing for the symptoms. The treatment provided no relief.    Past Medical History:  Diagnosis Date   Anemia    Anxiety    Arthritis    CAD (coronary artery disease) 2016   non-obstructive at cath   Colon polyps 09/11/2010   Tubular adenoma   COPD 12/14/2008   PATIENT DENIES     Depression    Diabetic coma ([place]) Feb. 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 Vernie Shanks    Renal disorder    chronic kidney disease    Stroke [facility])    3 MINI STROKES  RT SIDED WEAKNESS   Vertigo     Patient Active Problem List   Diagnosis Date Noted   Pulmonary  nodule 08/15/2018   Abnormal findings on diagnostic imaging of lung 08/15/2018   Suspected Covid-19 Virus Infection 08/04/2018   Bronchitis 06/21/2018   Oral thrush 06/21/2018   ILD (interstitial lung disease) ([place]) 04/18/2018   Insulin dependent diabetes mellitus ([place])    Acute on chronic diastolic CHF (congestive heart failure) ([place]) 04/02/2018   Acute pulmonary edema (HCC)    Left-sided weakness 03/16/2018   TIA (transient ischemic attack) 03/16/2018   Respiratory failure with hypoxia ([place]) 03/16/2018   Dizziness 04/23/2017   Hyperglycemia 10/31/2016   Closed fracture of transverse process of lumbar vertebra (B and E) 10/31/2016   Hematoma 10/31/2016   Recurrent falls 10/31/2016   Syncope 10/31/2016   Gait abnormality 10/19/2016   Diabetic peripheral neuropathy ([place]) 10/19/2016   Symptomatic bradycardia 10/13/2016   Bradycardia 10/12/2016   General weakness 10/12/2016   CKD (chronic kidney disease), stage III ([place]) 10/12/2016   Weakness    Orthostatic syncope 05/10/2016   Colitis 05/10/2016   Anemia 05/10/2016   Food impaction of esophagus 09/02/2015   Diabetic foot ulcer ([place]) 06/24/2015   Atheroscler of bypass graft of left leg with intermittent claudication ([place]) 04/17/2014   Carotid stenosis 05/19/2013   Acute kidney injury superimposed on CKD ([place]) 09/21/2012   Chronic pain syndrome 03/13/2012   Atherosclerosis of native artery of extremity with intermittent claudication ([place])  10/08/2011   PVD (peripheral vascular disease) ([place]) 07/30/2011   CAD (coronary artery disease) 05/10/2011   Hyperlipidemia    Unstable angina ([place]) 05/09/2011   CERVICAL CANCER 12/14/2008   Personal history of malignant neoplasm of kidney(V10.52) 12/14/2008   COPD (chronic obstructive pulmonary disease) ([place]) 12/14/2008   IBS 12/14/2008   Essential hypertension    Personal history of colonic polyps 08/13/2008   DISORDER, TOBACCO USE 08/04/2006    Hypothyroidism    Insulin dependent type 2 diabetes mellitus, controlled ([place])    Lumbar disc disease    ANXIETY STATE NOS 07/20/2006   GERD 07/20/2006   HX, PERSONAL, VENOUS THROMBOSIS/EMBOLISM 07/20/2006    Past Surgical History:  Procedure Laterality Date   ABDOMINAL AORTAGRAM N/A 08/21/2011   Procedure: ABDOMINAL Maxcine Ham;  Surgeon: Elam Dutch, MD;  Location: [facility] CATH LAB;  Service: Cardiovascular;  Laterality: N/A;   ABDOMINAL AORTAGRAM N/A 03/16/2014   Procedure: ABDOMINAL Maxcine Ham;  Surgeon: Elam Dutch, MD;  Location: [facility] CATH LAB;  Service: Cardiovascular;  Laterality: N/A;   ABDOMINAL HYSTERECTOMY     APPENDECTOMY     BLADDER SUSPENSION     CARDIAC CATHETERIZATION     CHOLECYSTECTOMY OPEN     ESOPHAGOGASTRODUODENOSCOPY (EGD) WITH PROPOFOL N/A 05/16/2014   Procedure: ESOPHAGOGASTRODUODENOSCOPY (EGD) WITH PROPOFOL with Balloon dilation;  Surgeon: Milus Banister, MD;  Location: [facility];  Service: Endoscopy;  Laterality: N/A;   ESOPHAGOGASTRODUODENOSCOPY (EGD) WITH PROPOFOL N/A 09/02/2015   Procedure: ESOPHAGOGASTRODUODENOSCOPY (EGD) WITH PROPOFOL;  Surgeon: Doran Stabler, MD;  Location: [facility];  Service: Gastroenterology;  Laterality: N/A;   FEMORAL-POPLITEAL BYPASS GRAFT  10/12/2011   Procedure: BYPASS GRAFT FEMORAL-POPLITEAL ARTERY;  Surgeon: Elam Dutch, MD;  Location: [facility] OR;  Service: Vascular;  Laterality: Left;  Left Femoral-Popliteal Bypass Graft using 62mm x 80cm Propaten Graft with intraop arteriogram times one.   FEMORAL-POPLITEAL BYPASS GRAFT Left 04/17/2014   Procedure: REDO LEFT FEMORAL-POPLITEAL ARTERY BYPASS USING GORE PROPATEN 8mmx80cm GRAFT;  Surgeon: Elam Dutch, MD;  Location: [facility];  Service: Vascular;  Laterality: Left;   FOOT SURGERY Left    "bone spurs"   INTRAOPERATIVE ARTERIOGRAM  10/12/2011   Procedure: INTRA OPERATIVE ARTERIOGRAM;  Surgeon: Elam Dutch, MD;  Location: [facility] OR;  Service: Vascular;   Laterality: Left;   KNEE ARTHROSCOPY Bilateral    "cartilage"   Laparascopic cryoablation of left kidney  08/2006   Dr. Gladis Riffle for renal cell cancer   LEFT HEART CATHETERIZATION WITH CORONARY ANGIOGRAM N/A 05/11/2011   Procedure: LEFT HEART CATHETERIZATION WITH CORONARY ANGIOGRAM;  Surgeon: Jolaine Artist, MD;  Location: [facility] CATH LAB;  Service: Cardiovascular;  Laterality: N/A;   LUNG SURGERY Right    lung nodule removed from the right side   PR VEIN BYPASS GRAFT,AORTO-FEM-POP  05/27/2010   SHOULDER ARTHROSCOPY Bilateral    'Spurs"   TUBAL LIGATION       OB History   No obstetric history on file.      Home Medications    Prior to Admission medications   Medication Sig Start Date End Date Taking? Authorizing Provider  ACCU-CHEK FASTCLIX LANCETS MISC Use 4 per day to check blood sugar dx code E11.9 11/29/14   Elayne Snare, MD  albuterol (PROVENTIL HFA;VENTOLIN HFA) 108 (90 Base) MCG/ACT inhaler Inhale 1-2 puffs into the lungs every 6 (six) hours as needed for wheezing or shortness of breath. Patient not taking: Reported on 08/15/2018 04/07/18   Raiford Noble Latif, DO  aspirin EC 325 MG  EC tablet Take 1 tablet (325 mg total) by mouth daily. 03/21/18   Hongalgi, Lenis Dickinson, MD  cilostazol (PLETAL) 100 MG tablet TAKE 1 TABLET BY MOUTH TWICE A DAY Patient taking differently: Take 100 mg by mouth 2 (two) times daily.  08/25/18   Waynetta Sandy, MD  DULoxetine (CYMBALTA) 60 MG capsule TAKE 1 CAPSULE BY MOUTH ONCE DAILY 08/12/18   Elayne Snare, MD  ferrous sulfate 325 (65 FE) MG tablet Take 325 mg by mouth daily with breakfast.     [provider]  fludrocortisone (FLORINEF) 0.1 MG tablet TAKE 1 TABLET BY MOUTH 3 TIMES A WEEK Patient not taking: Reported on 08/15/2018 04/20/18   Elayne Snare, MD  furosemide (LASIX) 20 MG tablet Take 1 tablet (20 mg total) by mouth daily. Patient taking differently: Take 60 mg by mouth daily.  03/20/18 03/20/19  Hongalgi, Lenis Dickinson, MD    gabapentin (NEURONTIN) 300 MG capsule TAKE 1 CAPSULE BY MOUTH TWICE DAILY Patient taking differently: Take 300 mg by mouth 2 (two) times daily.  06/16/18   Elayne Snare, MD  glucose blood (CONTOUR NEXT TEST) test strip Use to test blood sugar 3 times daily E11.9 09/05/18   Elayne Snare, MD  HYDROcodone-acetaminophen [facility]) 10-325 MG tablet Take 1 tablet by mouth 3 (three) times daily as needed (for pain). Follow up with PCP if refills needed 04/24/17   Aline August, MD  hydrOXYzine (ATARAX/VISTARIL) 25 MG tablet Take 1 tablet (25 mg total) by mouth every 6 (six) hours. Patient not taking: Reported on 08/15/2018 10/27/17   Jacqlyn Larsen, PA-C  insulin lispro (HUMALOG) 100 UNIT/ML injection Inject 0.7 mLs (70 Units total) into the skin See admin instructions. Use 200 units every 3 days in insulin pump 09/15/18   Elayne Snare, MD  Insulin Pen Needle (BD PEN NEEDLE NANO U/F) 32G X 4 MM MISC Use to inject insulin 10/30/14   Elayne Snare, MD  ipratropium-albuterol (DUONEB) 0.5-2.5 (3) MG/3ML SOLN Take 3 mLs by nebulization every 6 (six) hours as needed. Patient not taking: Reported on 08/15/2018 04/18/18   Rigoberto Noel, MD  lansoprazole (PREVACID) 30 MG capsule Take 1 capsule (30 mg total) by mouth daily. 05/11/16   Geradine Girt, DO  levothyroxine (SYNTHROID, LEVOTHROID) 150 MCG tablet TAKE 1 TABLET BY MOUTH ONCE DAILY Patient taking differently: Take 150 mcg by mouth daily.  04/22/18   Elayne Snare, MD  metoprolol succinate (TOPROL-XL) 25 MG 24 hr tablet TAKE 1 TABLET BY MOUTH EVERY DAY Patient taking differently: Take 25 mg by mouth daily.  02/01/18   Imogene Burn, PA-C  nortriptyline (PAMELOR) 75 MG capsule Take 75 mg by mouth at bedtime. 01/24/14   [provider]  nystatin (MYCOSTATIN) 100000 UNIT/ML suspension Take 5 mLs (500,000 Units total) by mouth 4 (four) times daily. Patient not taking: Reported on 09/05/2018 06/21/18   Lauraine Rinne, NP  OXYGEN Inhale 1 L into the lungs as needed  (shortness of breath).    [provider]  potassium chloride (K-DUR) 10 MEQ tablet TAKE 1 TABLET BY MOUTH TWICE DAILY 06/16/18   Elayne Snare, MD  predniSONE (DELTASONE) 10 MG tablet TAKE 1 TABLET BY MOUTH EVERY DAY Patient not taking: Reported on 08/15/2018 07/28/18   Rigoberto Noel, MD  QUEtiapine (SEROQUEL) 100 MG tablet Take 100 mg by mouth at bedtime. 09/25/16   [provider]  tolterodine (DETROL LA) 4 MG 24 hr capsule Take 4 mg by mouth daily.  [provider]  triamcinolone cream (KENALOG) 0.1 % Apply 1 application topically 2 (two) times daily. 12/10/17   Kinnie Feil, PA-C  Vitamin D, Ergocalciferol, (DRISDOL) 50000 units CAPS capsule Take 50,000 Units by mouth every Monday.  09/18/16   [provider]    Family History Family History  Problem Relation Age of Onset   Heart disease Mother    Lung cancer Mother    Cancer Mother        Lung   Heart disease Father    Lung cancer Father    Cancer Father        Lung   Heart disease Sister        CABG- Open Heart    Social History Social History   Tobacco Use   Smoking status: Current Every Day Smoker    Packs/day: 1.50    Years: 50.00    Pack years: 75.00    Types: Cigarettes    Last attempt to quit: 06/24/2014    Years since quitting: 4.2   Smokeless tobacco: Never Used   Tobacco comment: 08/15/2018 5 cigs a day  Substance Use Topics   Alcohol use: No    Alcohol/week: 0.0 standard drinks   Drug use: No     Allergies   Patient has no known allergies.   Review of Systems Review of Systems  Constitutional: Negative for appetite change and fatigue.  HENT: Negative for congestion, ear discharge and sinus pressure.   Eyes: Negative for discharge.  Respiratory: Negative for cough.   Cardiovascular: Negative for chest pain.  Gastrointestinal: Negative for abdominal pain and diarrhea.  Genitourinary: Negative for frequency and hematuria.  Musculoskeletal: Negative  for back pain.       Ankle pain  Skin: Negative for rash.  Neurological: Negative for seizures and headaches.  Psychiatric/Behavioral: Negative for hallucinations.     Physical Exam Updated Vital Signs BP 110/89    Pulse 100    Temp 98.5 F (36.9 C) (Oral)    Resp 18    SpO2 94%   Physical Exam Vitals signs and nursing note reviewed.  Constitutional:      Appearance: She is well-developed.     Comments: Lethargic  HENT:     Head: Normocephalic.     Nose: Nose normal.  Eyes:     General: No scleral icterus.    Conjunctiva/sclera: Conjunctivae normal.  Neck:     Musculoskeletal: Neck supple.     Thyroid: No thyromegaly.  Cardiovascular:     Rate and Rhythm: Normal rate and regular rhythm.     Heart sounds: No murmur. No friction rub. No gallop.   Pulmonary:     Breath sounds: No stridor. No wheezing or rales.  Chest:     Chest wall: No tenderness.  Abdominal:     General: There is no distension.     Tenderness: There is no abdominal tenderness. There is no rebound.  Musculoskeletal: Normal range of motion.  Lymphadenopathy:     Cervical: No cervical adenopathy.  Skin:    Findings: No erythema or rash.  Neurological:     Motor: No abnormal muscle tone.     Coordination: Coordination normal.     Comments: Patient oriented to person and place only  Psychiatric:     Comments: Confused lethargic      ED Treatments / Results  Labs (all labs ordered are listed, but only abnormal results are displayed) Labs Reviewed  CBC WITH DIFFERENTIAL/PLATELET - Abnormal; Notable for  the following components:      Result Value   WBC 14.9 (*)    Hemoglobin 11.1 (*)    HCT 34.4 (*)    Neutro Abs 12.3 (*)    Abs Immature Granulocytes 0.10 (*)    All other components within normal limits  COMPREHENSIVE METABOLIC PANEL - Abnormal; Notable for the following components:   Potassium 3.4 (*)    CO2 21 (*)    Glucose, Bld 68 (*)    BUN 35 (*)    Creatinine, Ser 2.68 (*)     Calcium 8.6 (*)    Total Bilirubin 0.1 (*)    GFR calc non Af Amer 17 (*)    GFR calc Af Amer 20 (*)    All other components within normal limits  URINALYSIS, ROUTINE W REFLEX MICROSCOPIC - Abnormal; Notable for the following components:   Glucose, UA 50 (*)    All other components within normal limits  BLOOD GAS, ARTERIAL - Abnormal; Notable for the following components:   pO2, Arterial 75.6 (*)    Acid-base deficit 3.2 (*)    All other components within normal limits  RAPID URINE DRUG SCREEN, HOSP PERFORMED - Abnormal; Notable for the following components:   Opiates POSITIVE (*)    All other components within normal limits  SARS CORONAVIRUS 2 (HOSPITAL ORDER, [facility])  AMMONIA  BRAIN NATRIURETIC PEPTIDE  TROPONIN I (HIGH SENSITIVITY)  TROPONIN I (HIGH SENSITIVITY)    EKG None  Radiology Dg Chest 2 View  Result Date: 10/07/2018 CLINICAL DATA:  Weakness EXAM: CHEST - 2 VIEW COMPARISON:  08/15/2018, CT 09/29/2018 FINDINGS: Cardiomegaly with vascular congestion. No pleural effusion or pneumothorax. Aortic atherosclerosis. Vague nodular opacity in the right mid to upper lung is noted, no correlate on CT chest follow-up 09/29/2018. IMPRESSION: Cardiomegaly with vascular congestion Electronically Signed   By: Donavan Foil M.D.   On: 10/07/2018 19:48   Dg Ankle Complete Right  Result Date: 10/07/2018 CLINICAL DATA:  Weakness, fall EXAM: RIGHT ANKLE - COMPLETE 3+ VIEW COMPARISON:  None. FINDINGS: Acute oblique fracture distal shaft of the fibula above the ankle joint. Minimal less than 1/4 bone with lateral displacement distal fracture fragment. Ankle mortise is symmetric. Plantar calcaneal spur. IMPRESSION: Sure acute minimally displaced distal fibular fracture Electronically Signed   By: Donavan Foil M.D.   On: 10/07/2018 19:49   Ct Head Wo Contrast  Result Date: 10/07/2018 CLINICAL DATA:  Ataxia, post head trauma; C-spine fx, traumatic. Multiple falls.  Weakness. EXAM: CT HEAD WITHOUT CONTRAST CT CERVICAL SPINE WITHOUT CONTRAST TECHNIQUE: Multidetector CT imaging of the head and cervical spine was performed following the standard protocol without intravenous contrast. Multiplanar CT image reconstructions of the cervical spine were also generated. COMPARISON:  Head CT 03/16/2018 FINDINGS: CT HEAD FINDINGS Brain: No evidence of acute infarction, hemorrhage, hydrocephalus, extra-axial collection or mass lesion/mass effect. Age related atrophy. Similar chronic small vessel ischemia. Vascular: Atherosclerosis of skullbase vasculature without hyperdense vessel or abnormal calcification. Skull: No fracture or focal lesion. Sinuses/Orbits: Postsurgical change in the right maxillary sinus. No acute findings. Bilateral cataract resection. Other: None. CT CERVICAL SPINE FINDINGS Alignment: Normal. Skull base and vertebrae: No acute fracture. Vertebral body heights are maintained. The dens and skull base are intact. Non fusion posterior arch of C1, normal variant. Soft tissues and spinal canal: No prevertebral fluid or swelling. No visible canal hematoma. Disc levels: Disc space narrowing and endplate spurring most prominent at C4-C5. Scattered minor facet hypertrophy.  Upper chest: No acute findings, allowing for motion artifact. Other: Carotid calcifications. IMPRESSION: 1. No acute intracranial abnormality. No skull fracture. Stable atrophy and chronic small vessel ischemia. 2. Mild degenerative change in the cervical spine without acute fracture or subluxation. 3. Carotid and skullbase atherosclerosis. Electronically Signed   By: Keith Rake M.D.   On: 10/07/2018 20:47   Ct Cervical Spine Wo Contrast  Result Date: 10/07/2018 CLINICAL DATA:  Ataxia, post head trauma; C-spine fx, traumatic. Multiple falls. Weakness. EXAM: CT HEAD WITHOUT CONTRAST CT CERVICAL SPINE WITHOUT CONTRAST TECHNIQUE: Multidetector CT imaging of the head and cervical spine was performed  following the standard protocol without intravenous contrast. Multiplanar CT image reconstructions of the cervical spine were also generated. COMPARISON:  Head CT 03/16/2018 FINDINGS: CT HEAD FINDINGS Brain: No evidence of acute infarction, hemorrhage, hydrocephalus, extra-axial collection or mass lesion/mass effect. Age related atrophy. Similar chronic small vessel ischemia. Vascular: Atherosclerosis of skullbase vasculature without hyperdense vessel or abnormal calcification. Skull: No fracture or focal lesion. Sinuses/Orbits: Postsurgical change in the right maxillary sinus. No acute findings. Bilateral cataract resection. Other: None. CT CERVICAL SPINE FINDINGS Alignment: Normal. Skull base and vertebrae: No acute fracture. Vertebral body heights are maintained. The dens and skull base are intact. Non fusion posterior arch of C1, normal variant. Soft tissues and spinal canal: No prevertebral fluid or swelling. No visible canal hematoma. Disc levels: Disc space narrowing and endplate spurring most prominent at C4-C5. Scattered minor facet hypertrophy. Upper chest: No acute findings, allowing for motion artifact. Other: Carotid calcifications. IMPRESSION: 1. No acute intracranial abnormality. No skull fracture. Stable atrophy and chronic small vessel ischemia. 2. Mild degenerative change in the cervical spine without acute fracture or subluxation. 3. Carotid and skullbase atherosclerosis. Electronically Signed   By: Keith Rake M.D.   On: 10/07/2018 20:47    Procedures Procedures (including critical care time)  Medications Ordered in ED Medications  sodium chloride 0.9 % bolus 1,000 mL (has no administration in time range)  dextrose 50 % solution 25 mL (has no administration in time range)     Initial Impression / Assessment and Plan / ED Course  I have reviewed the triage vital signs and the nursing notes.  Pertinent labs & imaging results that were available during my care of the patient  were reviewed by me and considered in my medical decision making (see chart for details).        Labs and x-ray reviewed.  Patient is dehydrated and has AKI.  She also has a fracture to her right ankle.  Urinalysis is positive for narcotics.  Patient's confusion is probably related to dehydration and narcotic abuse.  She will be admitted to medicine for falls and AKI.  Her right ankle will have a splint placed on it and she can follow-up with orthopedics next week as an outpatient. Final Clinical Impressions(s) / ED Diagnoses   Final diagnoses:  None    ED Discharge Orders    None       Milton Ferguson, MD 10/07/18 2122

## 2018-10-08 ENCOUNTER — Other Ambulatory Visit: Payer: Self-pay

## 2018-10-08 DIAGNOSIS — G9340 Encephalopathy, unspecified: Secondary | ICD-10-CM | POA: Diagnosis not present

## 2018-10-08 DIAGNOSIS — I2510 Atherosclerotic heart disease of native coronary artery without angina pectoris: Secondary | ICD-10-CM | POA: Diagnosis not present

## 2018-10-08 DIAGNOSIS — I2583 Coronary atherosclerosis due to lipid rich plaque: Secondary | ICD-10-CM | POA: Diagnosis not present

## 2018-10-08 DIAGNOSIS — D649 Anemia, unspecified: Secondary | ICD-10-CM

## 2018-10-08 LAB — CBC WITH DIFFERENTIAL/PLATELET
Abs Immature Granulocytes: 0.08 10*3/uL — ABNORMAL HIGH (ref 0.00–0.07)
Abs Immature Granulocytes: 0.09 10*3/uL — ABNORMAL HIGH (ref 0.00–0.07)
Basophils Absolute: 0.1 10*3/uL (ref 0.0–0.1)
Basophils Absolute: 0.1 10*3/uL (ref 0.0–0.1)
Basophils Relative: 0 %
Basophils Relative: 1 %
Eosinophils Absolute: 0.1 10*3/uL (ref 0.0–0.5)
Eosinophils Absolute: 0.1 10*3/uL (ref 0.0–0.5)
Eosinophils Relative: 1 %
Eosinophils Relative: 1 %
HCT: 33 % — ABNORMAL LOW (ref 36.0–46.0)
HCT: 34.3 % — ABNORMAL LOW (ref 36.0–46.0)
Hemoglobin: 10.6 g/dL — ABNORMAL LOW (ref 12.0–15.0)
Hemoglobin: 11.2 g/dL — ABNORMAL LOW (ref 12.0–15.0)
Immature Granulocytes: 1 %
Immature Granulocytes: 1 %
Lymphocytes Relative: 14 %
Lymphocytes Relative: 16 %
Lymphs Abs: 1.9 10*3/uL (ref 0.7–4.0)
Lymphs Abs: 2 10*3/uL (ref 0.7–4.0)
MCH: 27.2 pg (ref 26.0–34.0)
MCH: 27.7 pg (ref 26.0–34.0)
MCHC: 32.1 g/dL (ref 30.0–36.0)
MCHC: 32.7 g/dL (ref 30.0–36.0)
MCV: 84.8 fL (ref 80.0–100.0)
MCV: 84.9 fL (ref 80.0–100.0)
Monocytes Absolute: 0.7 10*3/uL (ref 0.1–1.0)
Monocytes Absolute: 0.8 10*3/uL (ref 0.1–1.0)
Monocytes Relative: 5 %
Monocytes Relative: 6 %
Neutro Abs: 11.6 10*3/uL — ABNORMAL HIGH (ref 1.7–7.7)
Neutro Abs: 9.3 10*3/uL — ABNORMAL HIGH (ref 1.7–7.7)
Neutrophils Relative %: 75 %
Neutrophils Relative %: 79 %
Platelets: 285 10*3/uL (ref 150–400)
Platelets: 310 10*3/uL (ref 150–400)
RBC: 3.89 MIL/uL (ref 3.87–5.11)
RBC: 4.04 MIL/uL (ref 3.87–5.11)
RDW: 14.7 % (ref 11.5–15.5)
RDW: 14.8 % (ref 11.5–15.5)
WBC: 12.2 10*3/uL — ABNORMAL HIGH (ref 4.0–10.5)
WBC: 14.4 10*3/uL — ABNORMAL HIGH (ref 4.0–10.5)
nRBC: 0 % (ref 0.0–0.2)
nRBC: 0 % (ref 0.0–0.2)

## 2018-10-08 LAB — CBG MONITORING, ED: Glucose-Capillary: 134 mg/dL — ABNORMAL HIGH (ref 70–99)

## 2018-10-08 LAB — COMPREHENSIVE METABOLIC PANEL
ALT: 15 U/L (ref 0–44)
AST: 15 U/L (ref 15–41)
Albumin: 3.3 g/dL — ABNORMAL LOW (ref 3.5–5.0)
Alkaline Phosphatase: 90 U/L (ref 38–126)
Anion gap: 10 (ref 5–15)
BUN: 30 mg/dL — ABNORMAL HIGH (ref 8–23)
CO2: 22 mmol/L (ref 22–32)
Calcium: 8.5 mg/dL — ABNORMAL LOW (ref 8.9–10.3)
Chloride: 107 mmol/L (ref 98–111)
Creatinine, Ser: 2.23 mg/dL — ABNORMAL HIGH (ref 0.44–1.00)
GFR calc Af Amer: 25 mL/min — ABNORMAL LOW (ref >60)
GFR calc non Af Amer: 21 mL/min — ABNORMAL LOW (ref >60)
Glucose, Bld: 207 mg/dL — ABNORMAL HIGH (ref 70–99)
Potassium: 3.4 mmol/L — ABNORMAL LOW (ref 3.5–5.1)
Sodium: 139 mmol/L (ref 135–145)
Total Bilirubin: 0.3 mg/dL (ref 0.3–1.2)
Total Protein: 6.6 g/dL (ref 6.5–8.1)

## 2018-10-08 LAB — GLUCOSE, CAPILLARY
Glucose-Capillary: 205 mg/dL — ABNORMAL HIGH (ref 70–99)
Glucose-Capillary: 124 mg/dL — ABNORMAL HIGH (ref 70–99)
Glucose-Capillary: 244 mg/dL — ABNORMAL HIGH (ref 70–99)
Glucose-Capillary: 265 mg/dL — ABNORMAL HIGH (ref 70–99)
Glucose-Capillary: 179 mg/dL — ABNORMAL HIGH (ref 70–99)

## 2018-10-08 LAB — TROPONIN I (HIGH SENSITIVITY): Troponin I (High Sensitivity): 11 ng/L (ref <18)

## 2018-10-08 LAB — CORTISOL: Cortisol, Plasma: 10.6 ug/dL

## 2018-10-08 MED ORDER — ALBUTEROL SULFATE (2.5 MG/3ML) 0.083% IN NEBU: 2.5000 mg | INHALATION_SOLUTION | Freq: Four times a day (QID) | RESPIRATORY_TRACT | Status: DC | PRN

## 2018-10-08 MED ORDER — SODIUM CHLORIDE 0.9 % IV SOLN
INTRAVENOUS | Status: DC
  Administered 2018-10-08: 09:00:00 via INTRAVENOUS

## 2018-10-08 MED ORDER — HYDROCODONE-ACETAMINOPHEN 5-325 MG PO TABS
1.0000 | ORAL_TABLET | ORAL | Status: DC | PRN
  Administered 2018-10-08: 16:00:00 2 via ORAL
  Administered 2018-10-08: 1 via ORAL
  Administered 2018-10-08 – 2018-10-11 (×14): 2 via ORAL
  Filled 2018-10-08 (×9): qty 2
  Filled 2018-10-08: qty 1
  Filled 2018-10-08 (×6): qty 2

## 2018-10-08 MED ORDER — INSULIN ASPART 100 UNIT/ML ~~LOC~~ SOLN
0.0000 [IU] | SUBCUTANEOUS | Status: DC
  Administered 2018-10-08: 5 [IU] via SUBCUTANEOUS
  Administered 2018-10-08: 3 [IU] via SUBCUTANEOUS
  Administered 2018-10-08: 2 [IU] via SUBCUTANEOUS
  Administered 2018-10-08: 5 [IU] via SUBCUTANEOUS
  Administered 2018-10-08 – 2018-10-09 (×2): 8 [IU] via SUBCUTANEOUS
  Administered 2018-10-09 (×2): 3 [IU] via SUBCUTANEOUS

## 2018-10-08 MED ORDER — HYDROCODONE-ACETAMINOPHEN 5-325 MG PO TABS
1.0000 | ORAL_TABLET | Freq: Four times a day (QID) | ORAL | Status: DC | PRN
  Administered 2018-10-08 (×3): 1 via ORAL
  Filled 2018-10-08 (×3): qty 1

## 2018-10-08 NOTE — Progress Notes (Signed)
PROGRESS NOTE    April Branch  MPN:[id number] DOB: [birth year] DOA: 10/07/2018 PCP: [name], MD    Brief Narrative:  73 y.o. female with history of chronic kidney disease stage III baseline creatinine around 1.4, hypertension, diabetes mellitus type 2 on insulin pump, chronic anemia, chronic pain and interstitial lung disease and pulmonary nodule being followed by pulmonologist had a recent CAT scan was brought to the ER patient was found to have multiple falls.  Patient states she tripped and fell while walking.  Her neighbor called EMS and was brought to the ER.  Per ER physician patient also was confused.  ED Course: In the ER patient had a CT head and C-spine which were unremarkable.  EKG shows normal sinus rhythm intraventricular conduction delay.  X-ray of the right ankle was done which since patient is complaining of ankle pain which shows distal fibula fracture and patient was placed on a splint.  Urine drugs show positive for opiates.  Patient is mildly confused and was attributed to likely patient taking opiates and gabapentin in the setting of acute renal failure.  Patient also was hypoglycemic and patient's insulin pump was discontinued.  Patient was given fluids D50 and admitted for acute encephalopathy right fibula fracture acute renal failure.  Assessment & Plan:   Principal Problem:   Acute encephalopathy Active Problems:   History of malignant neoplasm of kidney   Hypothyroidism   Insulin dependent type 2 diabetes mellitus, controlled (HCC)   Essential hypertension   COPD (chronic obstructive pulmonary disease) (HCC)   HX, PERSONAL, VENOUS THROMBOSIS/EMBOLISM   CAD (coronary artery disease)   PVD (peripheral vascular disease) (HCC)   Acute kidney injury superimposed on CKD (HCC)   Anemia   Bradycardia   CKD (chronic kidney disease), stage III ([place])  1. Acute encephalopathy 1. Seemed much improved. Pt alert and oriented this AM 2. Suspected secondary to  sedating meds in setting of dehydration 2. Right fibula fracture 1. Currently with splint in place 2. Appreciate input by Orthopedic Surgery 3. Plan to continue splint, PT eval, and outpatient follow up 3. Acute on chronic kidney disease stage III 1. Mucus membranes are dry 2. Suspect dehydration. Have continued pt on gentle IVF hydration 3. Diuretic currently on hold 4. Repeat bmet in AM 4. Diabetes 2 with Hypoglycemia in the setting of worsening renal function 1. Will continue on SSI coverage for now 2. When mental status improves, consider resuming insulin pump 5. COPD 1. No wheezing at this time 2. Stable at present 6. Hypothyroidism 1. Continue synthroid as this time 7. Peripheral vascular disease 1. Continue on Pletal. 2. Stable at this time 8. Chronic Anemia 1. Follow CBC trends. 9. Hypertension 1. Continue on metoprolol and amlodipine.  DVT prophylaxis: Lovenox subQ Code Status: DNR Family Communication: Pt in room, family not at bedside Disposition Plan: Uncertain at this time  Consultants:   Orthopedic Surgery  Procedures:     Antimicrobials: Anti-infectives (From admission, onward)   None       Subjective: Without complaints today  Objective: Vitals:   10/08/18 0148 10/08/18 0616 10/08/18 1529 10/08/18 1542  BP: (!) 143/62 123/62 118/67   Pulse: (!) 107 100 (!) 112 (!) 104  Resp: 18 18 (!) 22 18  Temp: 98 F (36.7 C) 98.5 F (36.9 C) 98.9 F (37.2 C)   TempSrc: Oral Oral Oral   SpO2: 95% 97% 90%     Intake/Output Summary (Last 24 hours) at 10/08/2018 1700 Last data filed at  10/08/2018 1500 Gross per 24 hour  Intake 368.03 ml  Output 300 ml  Net 68.03 ml   There were no vitals filed for this visit.  Examination:  General exam: Appears calm and comfortable  Respiratory system: Clear to auscultation. Respiratory effort normal. Cardiovascular system: S1 & S2 heard, RRR Gastrointestinal system: Abdomen is nondistended, soft and  nontender. No organomegaly or masses felt. Normal bowel sounds heard. Central nervous system: Alert and oriented. No focal neurological deficits. Extremities: Symmetric 5 x 5 power. Skin: No rashes, lesions Psychiatry: Judgement and insight appear normal. Mood & affect appropriate.   Data Reviewed: I have personally reviewed following labs and imaging studies  CBC: Recent Labs  Lab 10/07/18 1904 10/07/18 2358 10/08/18 0550  WBC 14.9* 14.4* 12.2*  NEUTROABS 12.3* 11.6* 9.3*  HGB 11.1* 11.2* 10.6*  HCT 34.4* 34.3* 33.0*  MCV 85.1 84.9 84.8  PLT 298 310 765   Basic Metabolic Panel: Recent Labs  Lab 10/07/18 1904 10/08/18 0550  NA 138 139  K 3.4* 3.4*  CL 104 107  CO2 21* 22  GLUCOSE 68* 207*  BUN 35* 30*  CREATININE 2.68* 2.23*  CALCIUM 8.6* 8.5*   GFR: CrCl cannot be calculated (Unknown ideal weight.). Liver Function Tests: Recent Labs  Lab 10/07/18 1904 10/08/18 0550  AST 21 15  ALT 17 15  ALKPHOS 89 90  BILITOT 0.1* 0.3  PROT 7.3 6.6  ALBUMIN 3.6 3.3*   No results for input(s): LIPASE, AMYLASE in the last 168 hours. Recent Labs  Lab 10/07/18 1904  AMMONIA 17   Coagulation Profile: No results for input(s): INR, PROTIME in the last 168 hours. Cardiac Enzymes: No results for input(s): CKTOTAL, CKMB, CKMBINDEX, TROPONINI in the last 168 hours. BNP (last 3 results) No results for input(s): PROBNP in the last 8760 hours. HbA1C: No results for input(s): HGBA1C in the last 72 hours. CBG: Recent Labs  Lab 10/07/18 2146 10/07/18 2356 10/08/18 0822 10/08/18 1157 10/08/18 1534  GLUCAP 98 134* 205* 124* 244*   Lipid Profile: No results for input(s): CHOL, HDL, LDLCALC, TRIG, CHOLHDL, LDLDIRECT in the last 72 hours. Thyroid Function Tests: No results for input(s): TSH, T4TOTAL, FREET4, T3FREE, THYROIDAB in the last 72 hours. Anemia Panel: No results for input(s): VITAMINB12, FOLATE, FERRITIN, TIBC, IRON, RETICCTPCT in the last 72 hours. Sepsis  Labs: No results for input(s): PROCALCITON, LATICACIDVEN in the last 168 hours.  Recent Results (from the past 240 hour(s))  SARS Coronavirus 2 [facility] order, Performed in [facility] hospital lab) Nasopharyngeal Nasopharyngeal Swab     Status: None   Collection Time: 10/07/18  7:04 PM   Specimen: Nasopharyngeal Swab  Result Value Ref Range Status   SARS Coronavirus 2 NEGATIVE NEGATIVE Final    Comment: (NOTE) If result is NEGATIVE SARS-CoV-2 target nucleic acids are NOT DETECTED. The SARS-CoV-2 RNA is generally detectable in upper and lower  respiratory specimens during the acute phase of infection. The lowest  concentration of SARS-CoV-2 viral copies this assay can detect is 250  copies / mL. A negative result does not preclude SARS-CoV-2 infection  and should not be used as the sole basis for treatment or other  patient management decisions.  A negative result may occur with  improper specimen collection / handling, submission of specimen other  than nasopharyngeal swab, presence of viral mutation(s) within the  areas targeted by this assay, and inadequate number of viral copies  (<250 copies / mL). A negative result must be combined with clinical  observations, patient history, and epidemiological information. If result is POSITIVE SARS-CoV-2 target nucleic acids are DETECTED. The SARS-CoV-2 RNA is generally detectable in upper and lower  respiratory specimens dur ing the acute phase of infection.  Positive  results are indicative of active infection with SARS-CoV-2.  Clinical  correlation with patient history and other diagnostic information is  necessary to determine patient infection status.  Positive results do  not rule out bacterial infection or co-infection with other viruses. If result is PRESUMPTIVE POSTIVE SARS-CoV-2 nucleic acids MAY BE PRESENT.   A presumptive positive result was obtained on the submitted specimen  and confirmed on repeat testing.  While 2019  novel coronavirus  (SARS-CoV-2) nucleic acids may be present in the submitted sample  additional confirmatory testing may be necessary for epidemiological  and / or clinical management purposes  to differentiate between  SARS-CoV-2 and other Sarbecovirus currently known to infect humans.  If clinically indicated additional testing with an alternate test  methodology [phone]) is advised. The SARS-CoV-2 RNA is generally  detectable in upper and lower respiratory sp ecimens during the acute  phase of infection. The expected result is Negative. Fact Sheet for Patients:  StrictlyIdeas.no Fact Sheet for Healthcare Providers: BankingDealers.co.za This test is not yet approved or cleared by the [place] FDA and has been authorized for detection and/or diagnosis of SARS-CoV-2 by FDA under an Emergency Use Authorization (EUA).  This EUA will remain in effect (meaning this test can be used) for the duration of the COVID-19 declaration under Section 564(b)(1) of the Act, 21 U.S.C. section 360bbb-3(b)(1), unless the authorization is terminated or revoked sooner. Performed at [facility], [place] [address], [place], [place] [address]      Radiology Studies: Dg Chest 2 View  Result Date: 10/07/2018 CLINICAL DATA:  Weakness EXAM: CHEST - 2 VIEW COMPARISON:  08/15/2018, CT 09/29/2018 FINDINGS: Cardiomegaly with vascular congestion. No pleural effusion or pneumothorax. Aortic atherosclerosis. Vague nodular opacity in the right mid to upper lung is noted, no correlate on CT chest follow-up 09/29/2018. IMPRESSION: Cardiomegaly with vascular congestion Electronically Signed   By: Donavan Foil M.D.   On: 10/07/2018 19:48   Dg Ankle Complete Right  Result Date: 10/07/2018 CLINICAL DATA:  Weakness, fall EXAM: RIGHT ANKLE - COMPLETE 3+ VIEW COMPARISON:  None. FINDINGS: Acute oblique fracture distal shaft of the fibula above the ankle  joint. Minimal less than 1/4 bone with lateral displacement distal fracture fragment. Ankle mortise is symmetric. Plantar calcaneal spur. IMPRESSION: Sure acute minimally displaced distal fibular fracture Electronically Signed   By: Donavan Foil M.D.   On: 10/07/2018 19:49   Ct Head Wo Contrast  Result Date: 10/07/2018 CLINICAL DATA:  Ataxia, post head trauma; C-spine fx, traumatic. Multiple falls. Weakness. EXAM: CT HEAD WITHOUT CONTRAST CT CERVICAL SPINE WITHOUT CONTRAST TECHNIQUE: Multidetector CT imaging of the head and cervical spine was performed following the standard protocol without intravenous contrast. Multiplanar CT image reconstructions of the cervical spine were also generated. COMPARISON:  Head CT 03/16/2018 FINDINGS: CT HEAD FINDINGS Brain: No evidence of acute infarction, hemorrhage, hydrocephalus, extra-axial collection or mass lesion/mass effect. Age related atrophy. Similar chronic small vessel ischemia. Vascular: Atherosclerosis of skullbase vasculature without hyperdense vessel or abnormal calcification. Skull: No fracture or focal lesion. Sinuses/Orbits: Postsurgical change in the right maxillary sinus. No acute findings. Bilateral cataract resection. Other: None. CT CERVICAL SPINE FINDINGS Alignment: Normal. Skull base and vertebrae: No acute fracture. Vertebral body heights are maintained. The dens and skull base  are intact. Non fusion posterior arch of C1, normal variant. Soft tissues and spinal canal: No prevertebral fluid or swelling. No visible canal hematoma. Disc levels: Disc space narrowing and endplate spurring most prominent at C4-C5. Scattered minor facet hypertrophy. Upper chest: No acute findings, allowing for motion artifact. Other: Carotid calcifications. IMPRESSION: 1. No acute intracranial abnormality. No skull fracture. Stable atrophy and chronic small vessel ischemia. 2. Mild degenerative change in the cervical spine without acute fracture or subluxation. 3. Carotid  and skullbase atherosclerosis. Electronically Signed   By: Keith Rake M.D.   On: 10/07/2018 20:47   Ct Cervical Spine Wo Contrast  Result Date: 10/07/2018 CLINICAL DATA:  Ataxia, post head trauma; C-spine fx, traumatic. Multiple falls. Weakness. EXAM: CT HEAD WITHOUT CONTRAST CT CERVICAL SPINE WITHOUT CONTRAST TECHNIQUE: Multidetector CT imaging of the head and cervical spine was performed following the standard protocol without intravenous contrast. Multiplanar CT image reconstructions of the cervical spine were also generated. COMPARISON:  Head CT 03/16/2018 FINDINGS: CT HEAD FINDINGS Brain: No evidence of acute infarction, hemorrhage, hydrocephalus, extra-axial collection or mass lesion/mass effect. Age related atrophy. Similar chronic small vessel ischemia. Vascular: Atherosclerosis of skullbase vasculature without hyperdense vessel or abnormal calcification. Skull: No fracture or focal lesion. Sinuses/Orbits: Postsurgical change in the right maxillary sinus. No acute findings. Bilateral cataract resection. Other: None. CT CERVICAL SPINE FINDINGS Alignment: Normal. Skull base and vertebrae: No acute fracture. Vertebral body heights are maintained. The dens and skull base are intact. Non fusion posterior arch of C1, normal variant. Soft tissues and spinal canal: No prevertebral fluid or swelling. No visible canal hematoma. Disc levels: Disc space narrowing and endplate spurring most prominent at C4-C5. Scattered minor facet hypertrophy. Upper chest: No acute findings, allowing for motion artifact. Other: Carotid calcifications. IMPRESSION: 1. No acute intracranial abnormality. No skull fracture. Stable atrophy and chronic small vessel ischemia. 2. Mild degenerative change in the cervical spine without acute fracture or subluxation. 3. Carotid and skullbase atherosclerosis. Electronically Signed   By: Keith Rake M.D.   On: 10/07/2018 20:47    Scheduled Meds:  aspirin  325 mg Oral Daily    cilostazol  100 mg Oral BID   DULoxetine  60 mg Oral Daily   enoxaparin (LOVENOX) injection  30 mg Subcutaneous Q24H   ferrous sulfate  325 mg Oral Q breakfast   insulin aspart  0-15 Units Subcutaneous Q4H   levothyroxine  150 mcg Oral QAC breakfast   metoprolol succinate  25 mg Oral Daily   pantoprazole  20 mg Oral Daily   Continuous Infusions:  sodium chloride 75 mL/hr at 10/08/18 1500     LOS: 0 days   Marylu Lund, MD [name] Pager On Amion  If 7PM-7AM, please contact night-coverage 10/08/2018, 5:00 PM

## 2018-10-08 NOTE — ED Notes (Signed)
ED TO INPATIENT HANDOFF REPORT  ED Nurse Name and Phone #: Christinia Gully Name/Age/Gender April Branch 73 y.o. female Room/Bed: WA03/WA03  Code Status   Code Status: DNR  Home/SNF/Other Given to floor Patient oriented to: self Is this baseline? No   Triage Complete: Triage complete  Chief Complaint Multiple Falls  Triage Note BIB [facility] from home family reports "multiple falls" throughout day no LOC or taken blood thinners. Pt states feeling weak. PT report right ankle pain.  Pt has not eaten since breakfast. PT normal on 2L and has been off for some time. Currently on 2L 95%   110/70 85 cbg 78  20 L FA- 263ml    Allergies No Known Allergies  Level of Care/Admitting Diagnosis ED Disposition    ED Disposition Condition Comment   Admit  Hospital Area: [facility] [[id number]]  Level of Care: Telemetry [5]  Admit to tele based on following criteria: Monitor for Ischemic changes  Covid Evaluation: N/A  Diagnosis: Acute encephalopathy [244010]  Admitting Physician: Rise Patience [phone]  Attending Physician: Rise Patience [email or web address]  PT Class (Do Not Modify): Observation [104]  PT Acc Code (Do Not Modify): Observation [10022]       B Medical/Surgery History Past Medical History:  Diagnosis Date  . Anemia   . Anxiety   . Arthritis   . CAD (coronary artery disease) 2016   non-obstructive at cath  . Colon polyps 09/11/2010   Tubular adenoma  . COPD 12/14/2008   PATIENT DENIES    . Depression   . Diabetic coma ([place]) Feb. 2014  . Fall at home 2016   x 2 in January 2016, Left knee  . Fibromyalgia   . GERD 07/20/2006  . Headache(784.0)   . History of transient ischemic attack (TIA)   . HPV (human papilloma virus) infection   . Hyperlipidemia   . Hypertension   . Hypothyroidism   . 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 Lumbar disc disease   . Neuromuscular disorder ([place])     PERIPHERAL NEUROPATHY  . Peripheral vascular disease ([place])   . Personal history of malignant neoplasm of kidney(V10.52) 12/14/2008   Laparoscopic biopsy and cryoablation 7/08 Dr. Vernie Shanks   . Renal disorder    chronic kidney disease   . Stroke [facility])    3 MINI STROKES  RT SIDED WEAKNESS  . Vertigo    Past Surgical History:  Procedure Laterality Date  . ABDOMINAL AORTAGRAM N/A 08/21/2011   Procedure: ABDOMINAL Maxcine Ham;  Surgeon: Elam Dutch, MD;  Location: [facility] CATH LAB;  Service: Cardiovascular;  Laterality: N/A;  . ABDOMINAL AORTAGRAM N/A 03/16/2014   Procedure: ABDOMINAL Maxcine Ham;  Surgeon: Elam Dutch, MD;  Location: [facility] CATH LAB;  Service: Cardiovascular;  Laterality: N/A;  . ABDOMINAL HYSTERECTOMY    . APPENDECTOMY    . BLADDER SUSPENSION    . CARDIAC CATHETERIZATION    . CHOLECYSTECTOMY OPEN    . ESOPHAGOGASTRODUODENOSCOPY (EGD) WITH PROPOFOL N/A 05/16/2014   Procedure: ESOPHAGOGASTRODUODENOSCOPY (EGD) WITH PROPOFOL with Balloon dilation;  Surgeon: Milus Banister, MD;  Location: [place];  Service: Endoscopy;  Laterality: N/A;  . ESOPHAGOGASTRODUODENOSCOPY (EGD) WITH PROPOFOL N/A 09/02/2015   Procedure: ESOPHAGOGASTRODUODENOSCOPY (EGD) WITH PROPOFOL;  Surgeon: Doran Stabler, MD;  Location: [place];  Service: Gastroenterology;  Laterality: N/A;  . FEMORAL-POPLITEAL BYPASS GRAFT  10/12/2011   Procedure: BYPASS GRAFT FEMORAL-POPLITEAL ARTERY;  Surgeon: Elam Dutch, MD;  Location: [facility] OR;  Service: Vascular;  Laterality: Left;  Left Femoral-Popliteal Bypass Graft using 74mm x 80cm Propaten Graft with intraop arteriogram times one.  . FEMORAL-POPLITEAL BYPASS GRAFT Left 04/17/2014   Procedure: REDO LEFT FEMORAL-POPLITEAL ARTERY BYPASS USING GORE PROPATEN 107mmx80cm GRAFT;  Surgeon: Elam Dutch, MD;  Location: [place];  Service: Vascular;  Laterality: Left;  . FOOT SURGERY Left    "bone spurs"  . INTRAOPERATIVE ARTERIOGRAM  10/12/2011   Procedure: INTRA OPERATIVE  ARTERIOGRAM;  Surgeon: Elam Dutch, MD;  Location: [place];  Service: Vascular;  Laterality: Left;  . KNEE ARTHROSCOPY Bilateral    "cartilage"  . Laparascopic cryoablation of left kidney  08/2006   Dr. Gladis Riffle for renal cell cancer  . LEFT HEART CATHETERIZATION WITH CORONARY ANGIOGRAM N/A 05/11/2011   Procedure: LEFT HEART CATHETERIZATION WITH CORONARY ANGIOGRAM;  Surgeon: Jolaine Artist, MD;  Location: [facility] CATH LAB;  Service: Cardiovascular;  Laterality: N/A;  . LUNG SURGERY Right    lung nodule removed from the right side  . PR VEIN BYPASS GRAFT,AORTO-FEM-POP  05/27/2010  . SHOULDER ARTHROSCOPY Bilateral    'Spurs"  . TUBAL LIGATION       A IV Location/Drains/Wounds Patient Lines/Drains/Airways Status   Active Line/Drains/Airways    Name:   Placement date:   Placement time:   Site:   Days:   Peripheral IV 10/07/18 Left;Anterior Forearm   10/07/18    1821    Forearm   1          Intake/Output Last 24 hours No intake or output data in the 24 hours ending 10/08/18 0101  Labs/Imaging Results for orders placed or performed during the hospital encounter of 10/07/18 (from the past 48 hour(s))  Blood gas, arterial     Status: Abnormal   Collection Time: 10/07/18  6:39 PM  Result Value Ref Range   O2 Content 2.0 L/min   Delivery systems NASAL CANNULA    pH, Arterial 7.350 7.350 - 7.450   pCO2 arterial 40.0 32.0 - 48.0 mmHg   pO2, Arterial 75.6 (L) 83.0 - 108.0 mmHg   Bicarbonate 21.5 20.0 - 28.0 mmol/L   Acid-base deficit 3.2 (H) 0.0 - 2.0 mmol/L   O2 Saturation 93.5 %   Patient temperature 98.6    Collection site LEFT BRACHIAL    Drawn by [id number]    Sample type ARTERIAL DRAW    Allens test (pass/fail) PASS PASS    Comment: Performed at [facility], [place] [address] [place], [place] [address]  CBC with Differential/Platelet     Status: Abnormal   Collection Time: 10/07/18  7:04 PM  Result Value Ref Range   WBC 14.9 (H) 4.0 - 10.5 K/uL   RBC 4.04 3.87  - 5.11 MIL/uL   Hemoglobin 11.1 (L) 12.0 - 15.0 g/dL   HCT 34.4 (L) 36.0 - 46.0 %   MCV 85.1 80.0 - 100.0 fL   MCH 27.5 26.0 - 34.0 pg   MCHC 32.3 30.0 - 36.0 g/dL   RDW 14.7 11.5 - 15.5 %   Platelets 298 150 - 400 K/uL   nRBC 0.0 0.0 - 0.2 %   Neutrophils Relative % 83 %   Neutro Abs 12.3 (H) 1.7 - 7.7 K/uL   Lymphocytes Relative 11 %   Lymphs Abs 1.7 0.7 - 4.0 K/uL   Monocytes Relative 5 %   Monocytes Absolute 0.7 0.1 - 1.0 K/uL   Eosinophils Relative 0 %   Eosinophils Absolute 0.0 0.0 -  0.5 K/uL   Basophils Relative 0 %   Basophils Absolute 0.0 0.0 - 0.1 K/uL   Immature Granulocytes 1 %   Abs Immature Granulocytes 0.10 (H) 0.00 - 0.07 K/uL    Comment: Performed at [facility], [place] [address] [place], [place] [address]  Comprehensive metabolic panel     Status: Abnormal   Collection Time: 10/07/18  7:04 PM  Result Value Ref Range   Sodium 138 135 - 145 mmol/L   Potassium 3.4 (L) 3.5 - 5.1 mmol/L   Chloride 104 98 - 111 mmol/L   CO2 21 (L) 22 - 32 mmol/L   Glucose, Bld 68 (L) 70 - 99 mg/dL   BUN 35 (H) 8 - 23 mg/dL   Creatinine, Ser 2.68 (H) 0.44 - 1.00 mg/dL   Calcium 8.6 (L) 8.9 - 10.3 mg/dL   Total Protein 7.3 6.5 - 8.1 g/dL   Albumin 3.6 3.5 - 5.0 g/dL   AST 21 15 - 41 U/L   ALT 17 0 - 44 U/L   Alkaline Phosphatase 89 38 - 126 U/L   Total Bilirubin 0.1 (L) 0.3 - 1.2 mg/dL   GFR calc non Af Amer 17 (L) >60 mL/min   GFR calc Af Amer 20 (L) >60 mL/min   Anion gap 13 5 - 15    Comment: Performed at [facility], [place] [address] [place], [place] [address]  Urinalysis, Routine w reflex microscopic     Status: Abnormal   Collection Time: 10/07/18  7:04 PM  Result Value Ref Range   Color, Urine YELLOW YELLOW   APPearance CLEAR CLEAR   Specific Gravity, Urine 1.018 1.005 - 1.030   pH 5.0 5.0 - 8.0   Glucose, UA 50 (A) NEGATIVE mg/dL   Hgb urine dipstick NEGATIVE NEGATIVE   Bilirubin Urine NEGATIVE NEGATIVE   Ketones, ur NEGATIVE  NEGATIVE mg/dL   Protein, ur NEGATIVE NEGATIVE mg/dL   Nitrite NEGATIVE NEGATIVE   Leukocytes,Ua NEGATIVE NEGATIVE    Comment: Performed at [facility] [address], [place], [place] [address]  SARS Coronavirus 2 [facility] order, Performed in [facility] hospital lab) Nasopharyngeal Nasopharyngeal Swab     Status: None   Collection Time: 10/07/18  7:04 PM   Specimen: Nasopharyngeal Swab  Result Value Ref Range   SARS Coronavirus 2 NEGATIVE NEGATIVE    Comment: (NOTE) If result is NEGATIVE SARS-CoV-2 target nucleic acids are NOT DETECTED. The SARS-CoV-2 RNA is generally detectable in upper and lower  respiratory specimens during the acute phase of infection. The lowest  concentration of SARS-CoV-2 viral copies this assay can detect is 250  copies / mL. A negative result does not preclude SARS-CoV-2 infection  and should not be used as the sole basis for treatment or other  patient management decisions.  A negative result may occur with  improper specimen collection / handling, submission of specimen other  than nasopharyngeal swab, presence of viral mutation(s) within the  areas targeted by this assay, and inadequate number of viral copies  (<250 copies / mL). A negative result must be combined with clinical  observations, patient history, and epidemiological information. If result is POSITIVE SARS-CoV-2 target nucleic acids are DETECTED. The SARS-CoV-2 RNA is generally detectable in upper and lower  respiratory specimens dur ing the acute phase of infection.  Positive  results are indicative of active infection with SARS-CoV-2.  Clinical  correlation with patient history and other diagnostic information is  necessary to determine  patient infection status.  Positive results do  not rule out bacterial infection or co-infection with other viruses. If result is PRESUMPTIVE POSTIVE SARS-CoV-2 nucleic acids MAY BE PRESENT.   A presumptive positive result was  obtained on the submitted specimen  and confirmed on repeat testing.  While 2019 novel coronavirus  (SARS-CoV-2) nucleic acids may be present in the submitted sample  additional confirmatory testing may be necessary for epidemiological  and / or clinical management purposes  to differentiate between  SARS-CoV-2 and other Sarbecovirus currently known to infect humans.  If clinically indicated additional testing with an alternate test  methodology [phone]) is advised. The SARS-CoV-2 RNA is generally  detectable in upper and lower respiratory sp ecimens during the acute  phase of infection. The expected result is Negative. Fact Sheet for Patients:  StrictlyIdeas.no Fact Sheet for Healthcare Providers: BankingDealers.co.za This test is not yet approved or cleared by the [place] FDA and has been authorized for detection and/or diagnosis of SARS-CoV-2 by FDA under an Emergency Use Authorization (EUA).  This EUA will remain in effect (meaning this test can be used) for the duration of the COVID-19 declaration under Section 564(b)(1) of the Act, 21 U.S.C. section 360bbb-3(b)(1), unless the authorization is terminated or revoked sooner. Performed at [facility], [place] [address], [place], [place] [address]   Troponin I (High Sensitivity)     Status: None   Collection Time: 10/07/18  7:04 PM  Result Value Ref Range   Troponin I (High Sensitivity) 10 <18 ng/L    Comment: (NOTE) Elevated high sensitivity troponin I (hsTnI) values and significant  changes across serial measurements may suggest ACS but many other  chronic and acute conditions are known to elevate hsTnI results.  Refer to the "Links" section for chest pain algorithms and additional  guidance. Performed at [facility], [place] [address] [place], [place] [address]   Ammonia     Status: None   Collection Time: 10/07/18  7:04 PM  Result  Value Ref Range   Ammonia 17 9 - 35 umol/L    Comment: Performed at [facility], [place] [address] [place], [place] [address]  Rapid urine drug screen (hospital performed)     Status: Abnormal   Collection Time: 10/07/18  7:04 PM  Result Value Ref Range   Opiates POSITIVE (A) NONE DETECTED   Cocaine NONE DETECTED NONE DETECTED   Benzodiazepines NONE DETECTED NONE DETECTED   Amphetamines NONE DETECTED NONE DETECTED   Tetrahydrocannabinol NONE DETECTED NONE DETECTED   Barbiturates NONE DETECTED NONE DETECTED    Comment: (NOTE) DRUG SCREEN FOR MEDICAL PURPOSES ONLY.  IF CONFIRMATION IS NEEDED FOR ANY PURPOSE, NOTIFY LAB WITHIN 5 DAYS. LOWEST DETECTABLE LIMITS FOR URINE DRUG SCREEN Drug Class                     Cutoff (ng/mL) Amphetamine and metabolites    1000 Barbiturate and metabolites    200 Benzodiazepine                 664 Tricyclics and metabolites     300 Opiates and metabolites        300 Cocaine and metabolites        300 THC                            50 Performed at [facility], [place] [address] [place], [place]  [address]   CBG monitoring, ED     Status: None   Collection Time: 10/07/18  9:46 PM  Result Value Ref Range   Glucose-Capillary 98 70 - 99 mg/dL  CBG monitoring, ED     Status: Abnormal   Collection Time: 10/07/18 11:56 PM  Result Value Ref Range   Glucose-Capillary 134 (H) 70 - 99 mg/dL  CBC WITH DIFFERENTIAL     Status: Abnormal   Collection Time: 10/07/18 11:58 PM  Result Value Ref Range   WBC 14.4 (H) 4.0 - 10.5 K/uL   RBC 4.04 3.87 - 5.11 MIL/uL   Hemoglobin 11.2 (L) 12.0 - 15.0 g/dL   HCT 34.3 (L) 36.0 - 46.0 %   MCV 84.9 80.0 - 100.0 fL   MCH 27.7 26.0 - 34.0 pg   MCHC 32.7 30.0 - 36.0 g/dL   RDW 14.7 11.5 - 15.5 %   Platelets 310 150 - 400 K/uL   nRBC 0.0 0.0 - 0.2 %   Neutrophils Relative % 79 %   Neutro Abs 11.6 (H) 1.7 - 7.7 K/uL   Lymphocytes Relative 14 %   Lymphs Abs 2.0 0.7 - 4.0 K/uL    Monocytes Relative 5 %   Monocytes Absolute 0.7 0.1 - 1.0 K/uL   Eosinophils Relative 1 %   Eosinophils Absolute 0.1 0.0 - 0.5 K/uL   Basophils Relative 0 %   Basophils Absolute 0.1 0.0 - 0.1 K/uL   Immature Granulocytes 1 %   Abs Immature Granulocytes 0.09 (H) 0.00 - 0.07 K/uL    Comment: Performed at [facility], [place] [address], [place], [place] [address]   Dg Chest 2 View  Result Date: 10/07/2018 CLINICAL DATA:  Weakness EXAM: CHEST - 2 VIEW COMPARISON:  08/15/2018, CT 09/29/2018 FINDINGS: Cardiomegaly with vascular congestion. No pleural effusion or pneumothorax. Aortic atherosclerosis. Vague nodular opacity in the right mid to upper lung is noted, no correlate on CT chest follow-up 09/29/2018. IMPRESSION: Cardiomegaly with vascular congestion Electronically Signed   By: Donavan Foil M.D.   On: 10/07/2018 19:48   Dg Ankle Complete Right  Result Date: 10/07/2018 CLINICAL DATA:  Weakness, fall EXAM: RIGHT ANKLE - COMPLETE 3+ VIEW COMPARISON:  None. FINDINGS: Acute oblique fracture distal shaft of the fibula above the ankle joint. Minimal less than 1/4 bone with lateral displacement distal fracture fragment. Ankle mortise is symmetric. Plantar calcaneal spur. IMPRESSION: Sure acute minimally displaced distal fibular fracture Electronically Signed   By: Donavan Foil M.D.   On: 10/07/2018 19:49   Ct Head Wo Contrast  Result Date: 10/07/2018 CLINICAL DATA:  Ataxia, post head trauma; C-spine fx, traumatic. Multiple falls. Weakness. EXAM: CT HEAD WITHOUT CONTRAST CT CERVICAL SPINE WITHOUT CONTRAST TECHNIQUE: Multidetector CT imaging of the head and cervical spine was performed following the standard protocol without intravenous contrast. Multiplanar CT image reconstructions of the cervical spine were also generated. COMPARISON:  Head CT 03/16/2018 FINDINGS: CT HEAD FINDINGS Brain: No evidence of acute infarction, hemorrhage, hydrocephalus, extra-axial collection or mass  lesion/mass effect. Age related atrophy. Similar chronic small vessel ischemia. Vascular: Atherosclerosis of skullbase vasculature without hyperdense vessel or abnormal calcification. Skull: No fracture or focal lesion. Sinuses/Orbits: Postsurgical change in the right maxillary sinus. No acute findings. Bilateral cataract resection. Other: None. CT CERVICAL SPINE FINDINGS Alignment: Normal. Skull base and vertebrae: No acute fracture. Vertebral body heights are maintained. The dens and skull base are intact. Non fusion posterior arch of C1, normal variant. Soft tissues and spinal canal: No  prevertebral fluid or swelling. No visible canal hematoma. Disc levels: Disc space narrowing and endplate spurring most prominent at C4-C5. Scattered minor facet hypertrophy. Upper chest: No acute findings, allowing for motion artifact. Other: Carotid calcifications. IMPRESSION: 1. No acute intracranial abnormality. No skull fracture. Stable atrophy and chronic small vessel ischemia. 2. Mild degenerative change in the cervical spine without acute fracture or subluxation. 3. Carotid and skullbase atherosclerosis. Electronically Signed   By: Keith Rake M.D.   On: 10/07/2018 20:47   Ct Cervical Spine Wo Contrast  Result Date: 10/07/2018 CLINICAL DATA:  Ataxia, post head trauma; C-spine fx, traumatic. Multiple falls. Weakness. EXAM: CT HEAD WITHOUT CONTRAST CT CERVICAL SPINE WITHOUT CONTRAST TECHNIQUE: Multidetector CT imaging of the head and cervical spine was performed following the standard protocol without intravenous contrast. Multiplanar CT image reconstructions of the cervical spine were also generated. COMPARISON:  Head CT 03/16/2018 FINDINGS: CT HEAD FINDINGS Brain: No evidence of acute infarction, hemorrhage, hydrocephalus, extra-axial collection or mass lesion/mass effect. Age related atrophy. Similar chronic small vessel ischemia. Vascular: Atherosclerosis of skullbase vasculature without hyperdense vessel or  abnormal calcification. Skull: No fracture or focal lesion. Sinuses/Orbits: Postsurgical change in the right maxillary sinus. No acute findings. Bilateral cataract resection. Other: None. CT CERVICAL SPINE FINDINGS Alignment: Normal. Skull base and vertebrae: No acute fracture. Vertebral body heights are maintained. The dens and skull base are intact. Non fusion posterior arch of C1, normal variant. Soft tissues and spinal canal: No prevertebral fluid or swelling. No visible canal hematoma. Disc levels: Disc space narrowing and endplate spurring most prominent at C4-C5. Scattered minor facet hypertrophy. Upper chest: No acute findings, allowing for motion artifact. Other: Carotid calcifications. IMPRESSION: 1. No acute intracranial abnormality. No skull fracture. Stable atrophy and chronic small vessel ischemia. 2. Mild degenerative change in the cervical spine without acute fracture or subluxation. 3. Carotid and skullbase atherosclerosis. Electronically Signed   By: Keith Rake M.D.   On: 10/07/2018 20:47    Pending Labs Unresulted Labs (From admission, onward)    Start     Ordered   10/14/18 0500  Creatinine, serum  (enoxaparin (LOVENOX)    CrCl >/= 30 ml/min)  Weekly,   R    Comments: while on enoxaparin therapy    10/07/18 2358   10/07/18 2359  CBC  (enoxaparin (LOVENOX)    CrCl >/= 30 ml/min)  Once,   STAT    Comments: Baseline for enoxaparin therapy IF NOT ALREADY DRAWN.  Notify MD if PLT < 100 K.    10/07/18 2358   10/07/18 2359  Creatinine, serum  (enoxaparin (LOVENOX)    CrCl >/= 30 ml/min)  Once,   STAT    Comments: Baseline for enoxaparin therapy IF NOT ALREADY DRAWN.    10/07/18 2358          Vitals/Pain Today's Vitals   10/07/18 1830 10/07/18 1916 10/07/18 2223 10/07/18 2300  BP: (!) 121/103 110/89 111/66 123/64  Pulse: 80 100 89 99  Resp: 20 18 18 18   Temp: 98.5 F (36.9 C)     TempSrc: Oral     SpO2: 96% 94% 93% 98%    Isolation Precautions No active  isolations  Medications Medications  aspirin EC tablet 325 mg (has no administration in time range)  metoprolol succinate (TOPROL-XL) 24 hr tablet 25 mg (has no administration in time range)  DULoxetine (CYMBALTA) DR capsule 60 mg (has no administration in time range)  levothyroxine (SYNTHROID) tablet 150 mcg (has no administration in time  range)  pantoprazole (PROTONIX) EC tablet 20 mg (has no administration in time range)  cilostazol (PLETAL) tablet 100 mg (has no administration in time range)  ferrous sulfate tablet 325 mg (has no administration in time range)  albuterol (VENTOLIN HFA) 108 (90 Base) MCG/ACT inhaler 1-2 puff (has no administration in time range)  acetaminophen (TYLENOL) tablet 650 mg (has no administration in time range)    Or  acetaminophen (TYLENOL) suppository 650 mg (has no administration in time range)  ondansetron (ZOFRAN) tablet 4 mg (has no administration in time range)    Or  ondansetron (ZOFRAN) injection 4 mg (has no administration in time range)  enoxaparin (LOVENOX) injection 40 mg (has no administration in time range)    Mobility non-ambulatory High fall risk   Focused Assessments Cardiac Assessment Handoff:    Lab Results  Component Value Date   CKTOTAL 30 (L) 04/23/2017   CKMB 1.7 05/10/2011   TROPONINI <0.03 04/03/2018   Lab Results  Component Value Date   DDIMER 1.95 (H) 04/03/2018   Does the Patient currently have chest pain? No  , Pulmonary Assessment Handoff:  Lung sounds:   O2 Device: Nasal Cannula O2 Flow Rate (L/min): 2 L/min      R Recommendations: See Admitting Provider Note  Report given to:   Additional Notes:  Mental status varies minute to minute.

## 2018-10-08 NOTE — Consult Note (Signed)
Reason for Consult:right leg injury  Referring Physician: [name] is an 73 y.o. female.  HPI: 72 female with history of chronic kidney disease stage III baseline creatinine around 1.4, hypertension, diabetes mellitus type 2 on insulin pump, chronic anemia, chronic pain and interstitial lung disease and pulmonary nodule.  Patient states she tripped and fell while walking.  Her neighbor called EMS and was brought to the ER. No history of leg injury or surgery in the past. Worst with weight bearing and movement, better w rest.  Past Medical History:  Diagnosis Date  . Anemia   . Anxiety   . Arthritis   . CAD (coronary artery disease) 2016   non-obstructive at cath  . Colon polyps 09/11/2010   Tubular adenoma  . COPD 12/14/2008   PATIENT DENIES    . Depression   . Diabetic coma (April Branch) Feb. 2014  . Fall at home 2016   x 2 in January 2016, Left knee  . Fibromyalgia   . GERD 07/20/2006  . Headache(784.0)   . History of transient ischemic attack (TIA)   . HPV (human papilloma virus) infection   . Hyperlipidemia   . Hypertension   . Hypothyroidism   . Insulin dependent type 2 diabetes mellitus, controlled (April Branch) 1989  . Lumbar disc disease   . Neuromuscular disorder (April Branch)    PERIPHERAL NEUROPATHY  . Peripheral vascular disease (April Branch)   . Personal history of malignant neoplasm of kidney(V10.52) 12/14/2008   Laparoscopic biopsy and cryoablation 7/08 Dr. Vernie Branch   . Renal disorder    chronic kidney disease   . Stroke April Branch)    3 MINI STROKES  RT SIDED WEAKNESS  . Vertigo     Past Surgical History:  Procedure Laterality Date  . ABDOMINAL AORTAGRAM N/A 08/21/2011   Procedure: ABDOMINAL April Branch;  Surgeon: Elam Dutch, MD;  Location: [facility] CATH LAB;  Service: Cardiovascular;  Laterality: N/A;  . ABDOMINAL AORTAGRAM N/A 03/16/2014   Procedure: ABDOMINAL April Branch;  Surgeon: Elam Dutch, MD;  Location: [facility] CATH LAB;  Service: Cardiovascular;  Laterality:  N/A;  . ABDOMINAL HYSTERECTOMY    . APPENDECTOMY    . BLADDER SUSPENSION    . CARDIAC CATHETERIZATION    . CHOLECYSTECTOMY OPEN    . ESOPHAGOGASTRODUODENOSCOPY (EGD) WITH PROPOFOL N/A 05/16/2014   Procedure: ESOPHAGOGASTRODUODENOSCOPY (EGD) WITH PROPOFOL with Balloon dilation;  Surgeon: Milus Banister, MD;  Location: [place];  Service: Endoscopy;  Laterality: N/A;  . ESOPHAGOGASTRODUODENOSCOPY (EGD) WITH PROPOFOL N/A 09/02/2015   Procedure: ESOPHAGOGASTRODUODENOSCOPY (EGD) WITH PROPOFOL;  Surgeon: Doran Stabler, MD;  Location: [place];  Service: Gastroenterology;  Laterality: N/A;  . FEMORAL-POPLITEAL BYPASS GRAFT  10/12/2011   Procedure: BYPASS GRAFT FEMORAL-POPLITEAL ARTERY;  Surgeon: Elam Dutch, MD;  Location: [facility] OR;  Service: Vascular;  Laterality: Left;  Left Femoral-Popliteal Bypass Graft using 61mm x 80cm Propaten Graft with intraop arteriogram times one.  . FEMORAL-POPLITEAL BYPASS GRAFT Left 04/17/2014   Procedure: REDO LEFT FEMORAL-POPLITEAL ARTERY BYPASS USING GORE PROPATEN 53mmx80cm GRAFT;  Surgeon: Elam Dutch, MD;  Location: [place];  Service: Vascular;  Laterality: Left;  . FOOT SURGERY Left    "bone spurs"  . INTRAOPERATIVE ARTERIOGRAM  10/12/2011   Procedure: INTRA OPERATIVE ARTERIOGRAM;  Surgeon: Elam Dutch, MD;  Location: [place];  Service: Vascular;  Laterality: Left;  . KNEE ARTHROSCOPY Bilateral    "cartilage"  . Laparascopic cryoablation of left kidney  08/2006   Dr. Gladis Riffle for renal  cell cancer  . LEFT HEART CATHETERIZATION WITH CORONARY ANGIOGRAM N/A 05/11/2011   Procedure: LEFT HEART CATHETERIZATION WITH CORONARY ANGIOGRAM;  Surgeon: Jolaine Artist, MD;  Location: [facility] CATH LAB;  Service: Cardiovascular;  Laterality: N/A;  . LUNG SURGERY Right    lung nodule removed from the right side  . PR VEIN BYPASS GRAFT,AORTO-FEM-POP  05/27/2010  . SHOULDER ARTHROSCOPY Bilateral    'Spurs"  . TUBAL LIGATION      Family History  Problem Relation Age  of Onset  . Heart disease Mother   . Lung cancer Mother   . Cancer Mother        Lung  . Heart disease Father   . Lung cancer Father   . Cancer Father        Lung  . Heart disease Sister        CABG- Open Heart    Social History:  reports that she has been smoking cigarettes. She has a 75.00 pack-year smoking history. She has never used smokeless tobacco. She reports that she does not drink alcohol or use drugs.  Allergies: No Known Allergies  Medications: I have reviewed the patient's current medications.  Results for orders placed or performed during the hospital encounter of 10/07/18 (from the past 48 hour(s))  Blood gas, arterial     Status: Abnormal   Collection Time: 10/07/18  6:39 PM  Result Value Ref Range   O2 Content 2.0 L/min   Delivery systems NASAL CANNULA    pH, Arterial 7.350 7.350 - 7.450   pCO2 arterial 40.0 32.0 - 48.0 mmHg   pO2, Arterial 75.6 (L) 83.0 - 108.0 mmHg   Bicarbonate 21.5 20.0 - 28.0 mmol/L   Acid-base deficit 3.2 (H) 0.0 - 2.0 mmol/L   O2 Saturation 93.5 %   Patient temperature 98.6    Collection site LEFT BRACHIAL    Drawn by [id number]    Sample type ARTERIAL DRAW    Allens test (pass/fail) PASS PASS    Comment: Performed at [facility], [place] [address] [place], [place] [address]  CBC with Differential/Platelet     Status: Abnormal   Collection Time: 10/07/18  7:04 PM  Result Value Ref Range   WBC 14.9 (H) 4.0 - 10.5 K/uL   RBC 4.04 3.87 - 5.11 MIL/uL   Hemoglobin 11.1 (L) 12.0 - 15.0 g/dL   HCT 34.4 (L) 36.0 - 46.0 %   MCV 85.1 80.0 - 100.0 fL   MCH 27.5 26.0 - 34.0 pg   MCHC 32.3 30.0 - 36.0 g/dL   RDW 14.7 11.5 - 15.5 %   Platelets 298 150 - 400 K/uL   nRBC 0.0 0.0 - 0.2 %   Neutrophils Relative % 83 %   Neutro Abs 12.3 (H) 1.7 - 7.7 K/uL   Lymphocytes Relative 11 %   Lymphs Abs 1.7 0.7 - 4.0 K/uL   Monocytes Relative 5 %   Monocytes Absolute 0.7 0.1 - 1.0 K/uL   Eosinophils Relative 0 %   Eosinophils  Absolute 0.0 0.0 - 0.5 K/uL   Basophils Relative 0 %   Basophils Absolute 0.0 0.0 - 0.1 K/uL   Immature Granulocytes 1 %   Abs Immature Granulocytes 0.10 (H) 0.00 - 0.07 K/uL    Comment: Performed at [facility], [place] [address] [place], [place] [address]  Comprehensive metabolic panel     Status: Abnormal   Collection Time: 10/07/18  7:04 PM  Result Value Ref Range  Sodium 138 135 - 145 mmol/L   Potassium 3.4 (L) 3.5 - 5.1 mmol/L   Chloride 104 98 - 111 mmol/L   CO2 21 (L) 22 - 32 mmol/L   Glucose, Bld 68 (L) 70 - 99 mg/dL   BUN 35 (H) 8 - 23 mg/dL   Creatinine, Ser 2.68 (H) 0.44 - 1.00 mg/dL   Calcium 8.6 (L) 8.9 - 10.3 mg/dL   Total Protein 7.3 6.5 - 8.1 g/dL   Albumin 3.6 3.5 - 5.0 g/dL   AST 21 15 - 41 U/L   ALT 17 0 - 44 U/L   Alkaline Phosphatase 89 38 - 126 U/L   Total Bilirubin 0.1 (L) 0.3 - 1.2 mg/dL   GFR calc non Af Amer 17 (L) >60 mL/min   GFR calc Af Amer 20 (L) >60 mL/min   Anion gap 13 5 - 15    Comment: Performed at [facility], [place] [address] [place], [place] [address]  Urinalysis, Routine w reflex microscopic     Status: Abnormal   Collection Time: 10/07/18  7:04 PM  Result Value Ref Range   Color, Urine YELLOW YELLOW   APPearance CLEAR CLEAR   Specific Gravity, Urine 1.018 1.005 - 1.030   pH 5.0 5.0 - 8.0   Glucose, UA 50 (A) NEGATIVE mg/dL   Hgb urine dipstick NEGATIVE NEGATIVE   Bilirubin Urine NEGATIVE NEGATIVE   Ketones, ur NEGATIVE NEGATIVE mg/dL   Protein, ur NEGATIVE NEGATIVE mg/dL   Nitrite NEGATIVE NEGATIVE   Leukocytes,Ua NEGATIVE NEGATIVE    Comment: Performed at [place] [address], [place],  [address]  SARS Coronavirus 2 [facility] order, Performed in [facility] hospital lab) Nasopharyngeal Nasopharyngeal Swab     Status: None   Collection Time: 10/07/18  7:04 PM   Specimen: Nasopharyngeal Swab  Result Value Ref Range   SARS Coronavirus 2 NEGATIVE NEGATIVE     Comment: (NOTE) If result is NEGATIVE SARS-CoV-2 target nucleic acids are NOT DETECTED. The SARS-CoV-2 RNA is generally detectable in upper and lower  respiratory specimens during the acute phase of infection. The lowest  concentration of SARS-CoV-2 viral copies this assay can detect is 250  copies / mL. A negative result does not preclude SARS-CoV-2 infection  and should not be used as the sole basis for treatment or other  patient management decisions.  A negative result may occur with  improper specimen collection / handling, submission of specimen other  than nasopharyngeal swab, presence of viral mutation(s) within the  areas targeted by this assay, and inadequate number of viral copies  (<250 copies / mL). A negative result must be combined with clinical  observations, patient history, and epidemiological information. If result is POSITIVE SARS-CoV-2 target nucleic acids are DETECTED. The SARS-CoV-2 RNA is generally detectable in upper and lower  respiratory specimens dur ing the acute phase of infection.  Positive  results are indicative of active infection with SARS-CoV-2.  Clinical  correlation with patient history and other diagnostic information is  necessary to determine patient infection status.  Positive results do  not rule out bacterial infection or co-infection with other viruses. If result is PRESUMPTIVE POSTIVE SARS-CoV-2 nucleic acids MAY BE PRESENT.   A presumptive positive result was obtained on the submitted specimen  and confirmed on repeat testing.  While 2019 novel coronavirus  (SARS-CoV-2) nucleic acids may be present in the submitted sample  additional confirmatory testing may be necessary for epidemiological  and / or  clinical management purposes  to differentiate between  SARS-CoV-2 and other Sarbecovirus currently known to infect humans.  If clinically indicated additional testing with an alternate test  methodology [phone]) is advised. The SARS-CoV-2  RNA is generally  detectable in upper and lower respiratory sp ecimens during the acute  phase of infection. The expected result is Negative. Fact Sheet for Patients:  StrictlyIdeas.no Fact Sheet for Healthcare Providers: BankingDealers.co.za This test is not yet approved or cleared by the [place] FDA and has been authorized for detection and/or diagnosis of SARS-CoV-2 by FDA under an Emergency Use Authorization (EUA).  This EUA will remain in effect (meaning this test can be used) for the duration of the COVID-19 declaration under Section 564(b)(1) of the Act, 21 U.S.C. section 360bbb-3(b)(1), unless the authorization is terminated or revoked sooner. Performed at [facility], [place] [address], [place], [place] [address]   Troponin I (High Sensitivity)     Status: None   Collection Time: 10/07/18  7:04 PM  Result Value Ref Range   Troponin I (High Sensitivity) 10 <18 ng/L    Comment: (NOTE) Elevated high sensitivity troponin I (hsTnI) values and significant  changes across serial measurements may suggest ACS but many other  chronic and acute conditions are known to elevate hsTnI results.  Refer to the "Links" section for chest pain algorithms and additional  guidance. Performed at [facility], [place] [address], [place], [place] [address]   Ammonia     Status: None   Collection Time: 10/07/18  7:04 PM  Result Value Ref Range   Ammonia 17 9 - 35 umol/L    Comment: Performed at [facility], [place] [address], [place], [place] [address]  Rapid urine drug screen (hospital performed)     Status: Abnormal   Collection Time: 10/07/18  7:04 PM  Result Value Ref Range   Opiates POSITIVE (A) NONE DETECTED   Cocaine NONE DETECTED NONE DETECTED   Benzodiazepines NONE DETECTED NONE DETECTED   Amphetamines NONE DETECTED NONE DETECTED   Tetrahydrocannabinol NONE DETECTED NONE DETECTED    Barbiturates NONE DETECTED NONE DETECTED    Comment: (NOTE) DRUG SCREEN FOR MEDICAL PURPOSES ONLY.  IF CONFIRMATION IS NEEDED FOR ANY PURPOSE, NOTIFY LAB WITHIN 5 DAYS. LOWEST DETECTABLE LIMITS FOR URINE DRUG SCREEN Drug Class                     Cutoff (ng/mL) Amphetamine and metabolites    1000 Barbiturate and metabolites    200 Benzodiazepine                 836 Tricyclics and metabolites     300 Opiates and metabolites        300 Cocaine and metabolites        300 THC                            50 Performed at [facility], [place] [address] [place], [place] [address]   CBG monitoring, ED     Status: None   Collection Time: 10/07/18  9:46 PM  Result Value Ref Range   Glucose-Capillary 98 70 - 99 mg/dL  CBG monitoring, ED     Status: Abnormal   Collection Time: 10/07/18 11:56 PM  Result Value Ref Range   Glucose-Capillary 134 (H) 70 - 99 mg/dL  CBC WITH DIFFERENTIAL     Status: Abnormal  Collection Time: 10/07/18 11:58 PM  Result Value Ref Range   WBC 14.4 (H) 4.0 - 10.5 K/uL   RBC 4.04 3.87 - 5.11 MIL/uL   Hemoglobin 11.2 (L) 12.0 - 15.0 g/dL   HCT 34.3 (L) 36.0 - 46.0 %   MCV 84.9 80.0 - 100.0 fL   MCH 27.7 26.0 - 34.0 pg   MCHC 32.7 30.0 - 36.0 g/dL   RDW 14.7 11.5 - 15.5 %   Platelets 310 150 - 400 K/uL   nRBC 0.0 0.0 - 0.2 %   Neutrophils Relative % 79 %   Neutro Abs 11.6 (H) 1.7 - 7.7 K/uL   Lymphocytes Relative 14 %   Lymphs Abs 2.0 0.7 - 4.0 K/uL   Monocytes Relative 5 %   Monocytes Absolute 0.7 0.1 - 1.0 K/uL   Eosinophils Relative 1 %   Eosinophils Absolute 0.1 0.0 - 0.5 K/uL   Basophils Relative 0 %   Basophils Absolute 0.1 0.0 - 0.1 K/uL   Immature Granulocytes 1 %   Abs Immature Granulocytes 0.09 (H) 0.00 - 0.07 K/uL    Comment: Performed at [facility], [place] [address] [place], [place] [address]  Troponin I (High Sensitivity)     Status: None   Collection Time: 10/08/18  5:44 AM  Result Value Ref Range    Troponin I (High Sensitivity) 11 <18 ng/L    Comment: (NOTE) Elevated high sensitivity troponin I (hsTnI) values and significant  changes across serial measurements may suggest ACS but many other  chronic and acute conditions are known to elevate hsTnI results.  Refer to the "Links" section for chest pain algorithms and additional  guidance. Performed at [facility], [place] [address], [place], [place] [address]   Comprehensive metabolic panel     Status: Abnormal   Collection Time: 10/08/18  5:50 AM  Result Value Ref Range   Sodium 139 135 - 145 mmol/L   Potassium 3.4 (L) 3.5 - 5.1 mmol/L   Chloride 107 98 - 111 mmol/L   CO2 22 22 - 32 mmol/L   Glucose, Bld 207 (H) 70 - 99 mg/dL   BUN 30 (H) 8 - 23 mg/dL   Creatinine, Ser 2.23 (H) 0.44 - 1.00 mg/dL   Calcium 8.5 (L) 8.9 - 10.3 mg/dL   Total Protein 6.6 6.5 - 8.1 g/dL   Albumin 3.3 (L) 3.5 - 5.0 g/dL   AST 15 15 - 41 U/L   ALT 15 0 - 44 U/L   Alkaline Phosphatase 90 38 - 126 U/L   Total Bilirubin 0.3 0.3 - 1.2 mg/dL   GFR calc non Af Amer 21 (L) >60 mL/min   GFR calc Af Amer 25 (L) >60 mL/min   Anion gap 10 5 - 15    Comment: Performed at [facility], [place] [address], [place], [place] [address]  CBC with Differential/Platelet     Status: Abnormal   Collection Time: 10/08/18  5:50 AM  Result Value Ref Range   WBC 12.2 (H) 4.0 - 10.5 K/uL   RBC 3.89 3.87 - 5.11 MIL/uL   Hemoglobin 10.6 (L) 12.0 - 15.0 g/dL   HCT 33.0 (L) 36.0 - 46.0 %   MCV 84.8 80.0 - 100.0 fL   MCH 27.2 26.0 - 34.0 pg   MCHC 32.1 30.0 - 36.0 g/dL   RDW 14.8 11.5 - 15.5 %   Platelets 285 150 - 400 K/uL   nRBC 0.0 0.0 - 0.2 %  Neutrophils Relative % 75 %   Neutro Abs 9.3 (H) 1.7 - 7.7 K/uL   Lymphocytes Relative 16 %   Lymphs Abs 1.9 0.7 - 4.0 K/uL   Monocytes Relative 6 %   Monocytes Absolute 0.8 0.1 - 1.0 K/uL   Eosinophils Relative 1 %   Eosinophils Absolute 0.1 0.0 - 0.5 K/uL   Basophils Relative 1 %    Basophils Absolute 0.1 0.0 - 0.1 K/uL   Immature Granulocytes 1 %   Abs Immature Granulocytes 0.08 (H) 0.00 - 0.07 K/uL    Comment: Performed at [facility], [place] [address], [place], [place] [address]  Glucose, capillary     Status: Abnormal   Collection Time: 10/08/18  8:22 AM  Result Value Ref Range   Glucose-Capillary 205 (H) 70 - 99 mg/dL    Dg Chest 2 View  Result Date: 10/07/2018 CLINICAL DATA:  Weakness EXAM: CHEST - 2 VIEW COMPARISON:  08/15/2018, CT 09/29/2018 FINDINGS: Cardiomegaly with vascular congestion. No pleural effusion or pneumothorax. Aortic atherosclerosis. Vague nodular opacity in the right mid to upper lung is noted, no correlate on CT chest follow-up 09/29/2018. IMPRESSION: Cardiomegaly with vascular congestion Electronically Signed   By: Donavan Foil M.D.   On: 10/07/2018 19:48   Dg Ankle Complete Right  Result Date: 10/07/2018 CLINICAL DATA:  Weakness, fall EXAM: RIGHT ANKLE - COMPLETE 3+ VIEW COMPARISON:  None. FINDINGS: Acute oblique fracture distal shaft of the fibula above the ankle joint. Minimal less than 1/4 bone with lateral displacement distal fracture fragment. Ankle mortise is symmetric. Plantar calcaneal spur. IMPRESSION: Sure acute minimally displaced distal fibular fracture Electronically Signed   By: Donavan Foil M.D.   On: 10/07/2018 19:49   Ct Head Wo Contrast  Result Date: 10/07/2018 CLINICAL DATA:  Ataxia, post head trauma; C-spine fx, traumatic. Multiple falls. Weakness. EXAM: CT HEAD WITHOUT CONTRAST CT CERVICAL SPINE WITHOUT CONTRAST TECHNIQUE: Multidetector CT imaging of the head and cervical spine was performed following the standard protocol without intravenous contrast. Multiplanar CT image reconstructions of the cervical spine were also generated. COMPARISON:  Head CT 03/16/2018 FINDINGS: CT HEAD FINDINGS Brain: No evidence of acute infarction, hemorrhage, hydrocephalus, extra-axial collection or mass lesion/mass effect.  Age related atrophy. Similar chronic small vessel ischemia. Vascular: Atherosclerosis of skullbase vasculature without hyperdense vessel or abnormal calcification. Skull: No fracture or focal lesion. Sinuses/Orbits: Postsurgical change in the right maxillary sinus. No acute findings. Bilateral cataract resection. Other: None. CT CERVICAL SPINE FINDINGS Alignment: Normal. Skull base and vertebrae: No acute fracture. Vertebral body heights are maintained. The dens and skull base are intact. Non fusion posterior arch of C1, normal variant. Soft tissues and spinal canal: No prevertebral fluid or swelling. No visible canal hematoma. Disc levels: Disc space narrowing and endplate spurring most prominent at C4-C5. Scattered minor facet hypertrophy. Upper chest: No acute findings, allowing for motion artifact. Other: Carotid calcifications. IMPRESSION: 1. No acute intracranial abnormality. No skull fracture. Stable atrophy and chronic small vessel ischemia. 2. Mild degenerative change in the cervical spine without acute fracture or subluxation. 3. Carotid and skullbase atherosclerosis. Electronically Signed   By: Keith Rake M.D.   On: 10/07/2018 20:47   Ct Cervical Spine Wo Contrast  Result Date: 10/07/2018 CLINICAL DATA:  Ataxia, post head trauma; C-spine fx, traumatic. Multiple falls. Weakness. EXAM: CT HEAD WITHOUT CONTRAST CT CERVICAL SPINE WITHOUT CONTRAST TECHNIQUE: Multidetector CT imaging of the head and cervical spine was performed following the standard protocol without intravenous contrast. Multiplanar CT  image reconstructions of the cervical spine were also generated. COMPARISON:  Head CT 03/16/2018 FINDINGS: CT HEAD FINDINGS Brain: No evidence of acute infarction, hemorrhage, hydrocephalus, extra-axial collection or mass lesion/mass effect. Age related atrophy. Similar chronic small vessel ischemia. Vascular: Atherosclerosis of skullbase vasculature without hyperdense vessel or abnormal calcification.  Skull: No fracture or focal lesion. Sinuses/Orbits: Postsurgical change in the right maxillary sinus. No acute findings. Bilateral cataract resection. Other: None. CT CERVICAL SPINE FINDINGS Alignment: Normal. Skull base and vertebrae: No acute fracture. Vertebral body heights are maintained. The dens and skull base are intact. Non fusion posterior arch of C1, normal variant. Soft tissues and spinal canal: No prevertebral fluid or swelling. No visible canal hematoma. Disc levels: Disc space narrowing and endplate spurring most prominent at C4-C5. Scattered minor facet hypertrophy. Upper chest: No acute findings, allowing for motion artifact. Other: Carotid calcifications. IMPRESSION: 1. No acute intracranial abnormality. No skull fracture. Stable atrophy and chronic small vessel ischemia. 2. Mild degenerative change in the cervical spine without acute fracture or subluxation. 3. Carotid and skullbase atherosclerosis. Electronically Signed   By: Keith Rake M.D.   On: 10/07/2018 20:47    Review of Systems  Musculoskeletal: Positive for falls, joint pain and myalgias.  All other systems reviewed and are negative.  Blood pressure 123/62, pulse 100, temperature 98.5 F (36.9 C), temperature source Oral, resp. rate 18, SpO2 97 %. Physical Exam  Constitutional: She is oriented to person, place, and time. She appears well-developed and well-nourished.  HENT:  Head: Normocephalic and atraumatic.  Eyes: EOM are normal.  Neck: Normal range of motion.  Respiratory: Effort normal.  GI: Soft.  Musculoskeletal:     Comments: R lower leg in well fix well padded splint Wiggles toes, distally NVI   Neurological: She is alert and oriented to person, place, and time.  Skin: Skin is warm and dry.  Psychiatric: She has a normal mood and affect. Her behavior is normal.    Assessment/Plan: R min displaced distal fibula fracture Will not need surgery, can treat with immobilization in splint nonweight  bearing Will order PT, will need crutches and DME per PT Fu w Dr. Tamera Punt in 10-14 days to transition to [place] 10/08/2018, 11:16 AM

## 2018-10-08 NOTE — Patient Care Conference (Signed)
Called and updated April Branch on emergency contact. All questions answered

## 2018-10-09 DIAGNOSIS — T4275XA Adverse effect of unspecified antiepileptic and sedative-hypnotic drugs, initial encounter: Secondary | ICD-10-CM | POA: Diagnosis present

## 2018-10-09 DIAGNOSIS — Z85528 Personal history of other malignant neoplasm of kidney: Secondary | ICD-10-CM

## 2018-10-09 DIAGNOSIS — E039 Hypothyroidism, unspecified: Secondary | ICD-10-CM | POA: Diagnosis present

## 2018-10-09 DIAGNOSIS — D631 Anemia in chronic kidney disease: Secondary | ICD-10-CM | POA: Diagnosis present

## 2018-10-09 DIAGNOSIS — E1122 Type 2 diabetes mellitus with diabetic chronic kidney disease: Secondary | ICD-10-CM | POA: Diagnosis present

## 2018-10-09 DIAGNOSIS — N179 Acute kidney failure, unspecified: Secondary | ICD-10-CM | POA: Diagnosis present

## 2018-10-09 DIAGNOSIS — E1151 Type 2 diabetes mellitus with diabetic peripheral angiopathy without gangrene: Secondary | ICD-10-CM | POA: Diagnosis present

## 2018-10-09 DIAGNOSIS — K219 Gastro-esophageal reflux disease without esophagitis: Secondary | ICD-10-CM | POA: Diagnosis present

## 2018-10-09 DIAGNOSIS — J449 Chronic obstructive pulmonary disease, unspecified: Secondary | ICD-10-CM | POA: Diagnosis present

## 2018-10-09 DIAGNOSIS — E11649 Type 2 diabetes mellitus with hypoglycemia without coma: Secondary | ICD-10-CM | POA: Diagnosis present

## 2018-10-09 DIAGNOSIS — I459 Conduction disorder, unspecified: Secondary | ICD-10-CM | POA: Diagnosis present

## 2018-10-09 DIAGNOSIS — I10 Essential (primary) hypertension: Secondary | ICD-10-CM | POA: Diagnosis not present

## 2018-10-09 DIAGNOSIS — M797 Fibromyalgia: Secondary | ICD-10-CM | POA: Diagnosis present

## 2018-10-09 DIAGNOSIS — D649 Anemia, unspecified: Secondary | ICD-10-CM | POA: Diagnosis not present

## 2018-10-09 DIAGNOSIS — I129 Hypertensive chronic kidney disease with stage 1 through stage 4 chronic kidney disease, or unspecified chronic kidney disease: Secondary | ICD-10-CM | POA: Diagnosis present

## 2018-10-09 DIAGNOSIS — M1990 Unspecified osteoarthritis, unspecified site: Secondary | ICD-10-CM | POA: Diagnosis present

## 2018-10-09 DIAGNOSIS — E860 Dehydration: Secondary | ICD-10-CM | POA: Diagnosis present

## 2018-10-09 DIAGNOSIS — I2510 Atherosclerotic heart disease of native coronary artery without angina pectoris: Secondary | ICD-10-CM | POA: Diagnosis present

## 2018-10-09 DIAGNOSIS — G92 Toxic encephalopathy: Secondary | ICD-10-CM | POA: Diagnosis present

## 2018-10-09 DIAGNOSIS — E1142 Type 2 diabetes mellitus with diabetic polyneuropathy: Secondary | ICD-10-CM | POA: Diagnosis present

## 2018-10-09 DIAGNOSIS — Y92009 Unspecified place in unspecified non-institutional (private) residence as the place of occurrence of the external cause: Secondary | ICD-10-CM | POA: Diagnosis not present

## 2018-10-09 DIAGNOSIS — N183 Chronic kidney disease, stage 3 (moderate): Secondary | ICD-10-CM | POA: Diagnosis present

## 2018-10-09 DIAGNOSIS — Z66 Do not resuscitate: Secondary | ICD-10-CM | POA: Diagnosis present

## 2018-10-09 DIAGNOSIS — I4892 Unspecified atrial flutter: Secondary | ICD-10-CM | POA: Diagnosis present

## 2018-10-09 DIAGNOSIS — R410 Disorientation, unspecified: Secondary | ICD-10-CM | POA: Diagnosis present

## 2018-10-09 DIAGNOSIS — I4891 Unspecified atrial fibrillation: Secondary | ICD-10-CM | POA: Diagnosis not present

## 2018-10-09 DIAGNOSIS — R001 Bradycardia, unspecified: Secondary | ICD-10-CM | POA: Diagnosis present

## 2018-10-09 DIAGNOSIS — Z20828 Contact with and (suspected) exposure to other viral communicable diseases: Secondary | ICD-10-CM | POA: Diagnosis present

## 2018-10-09 DIAGNOSIS — S82831A Other fracture of upper and lower end of right fibula, initial encounter for closed fracture: Secondary | ICD-10-CM | POA: Diagnosis present

## 2018-10-09 DIAGNOSIS — Y9301 Activity, walking, marching and hiking: Secondary | ICD-10-CM | POA: Diagnosis present

## 2018-10-09 DIAGNOSIS — E785 Hyperlipidemia, unspecified: Secondary | ICD-10-CM | POA: Diagnosis present

## 2018-10-09 DIAGNOSIS — G9340 Encephalopathy, unspecified: Secondary | ICD-10-CM | POA: Diagnosis not present

## 2018-10-09 DIAGNOSIS — W010XXA Fall on same level from slipping, tripping and stumbling without subsequent striking against object, initial encounter: Secondary | ICD-10-CM | POA: Diagnosis present

## 2018-10-09 LAB — GLUCOSE, CAPILLARY
Glucose-Capillary: 256 mg/dL — ABNORMAL HIGH (ref 70–99)
Glucose-Capillary: 185 mg/dL — ABNORMAL HIGH (ref 70–99)
Glucose-Capillary: 161 mg/dL — ABNORMAL HIGH (ref 70–99)
Glucose-Capillary: 231 mg/dL — ABNORMAL HIGH (ref 70–99)
Glucose-Capillary: 85 mg/dL (ref 70–99)

## 2018-10-09 LAB — BASIC METABOLIC PANEL
Anion gap: 8 (ref 5–15)
BUN: 26 mg/dL — ABNORMAL HIGH (ref 8–23)
CO2: 20 mmol/L — ABNORMAL LOW (ref 22–32)
Calcium: 8.3 mg/dL — ABNORMAL LOW (ref 8.9–10.3)
Chloride: 109 mmol/L (ref 98–111)
Creatinine, Ser: 1.64 mg/dL — ABNORMAL HIGH (ref 0.44–1.00)
GFR calc Af Amer: 36 mL/min — ABNORMAL LOW (ref >60)
GFR calc non Af Amer: 31 mL/min — ABNORMAL LOW (ref >60)
Glucose, Bld: 256 mg/dL — ABNORMAL HIGH (ref 70–99)
Potassium: 4.2 mmol/L (ref 3.5–5.1)
Sodium: 137 mmol/L (ref 135–145)

## 2018-10-09 MED ORDER — MORPHINE SULFATE (PF) 2 MG/ML IV SOLN
2.0000 mg | Freq: Once | INTRAVENOUS | Status: AC
  Administered 2018-10-09: 2 mg via INTRAVENOUS
  Filled 2018-10-09: qty 1

## 2018-10-09 MED ORDER — INSULIN PUMP
Freq: Three times a day (TID) | SUBCUTANEOUS | Status: DC
  Administered 2018-10-09: 6.5 via SUBCUTANEOUS
  Filled 2018-10-09: qty 1

## 2018-10-09 MED ORDER — IPRATROPIUM-ALBUTEROL 0.5-2.5 (3) MG/3ML IN SOLN: 3.0000 mL | RESPIRATORY_TRACT | Status: DC | PRN

## 2018-10-09 NOTE — Evaluation (Signed)
Physical Therapy Evaluation Patient Details Name: April Branch MRN: [id number] DOB: 01-22-1946 Today's Date: 10/09/2018   History of Present Illness  73 y.o. female with history of chronic kidney disease stage III, hypertension, diabetes mellitus type 2 on insulin pump, chronic anemia, chronic pain and interstitial lung disease and pulmonary nodule, pt admitted d/t multiple falls.  Patient states she tripped and fell while walking. xray= minimally displaced R fibula fx, ortho consulted  Clinical Impression  Pt admitted with above diagnosis.  Pt limited by pain at time of PT eval, performed stand pivot to [facility] and then back to bed. Pt unable to amb d/t pain RLE with dependent  Positioning.  Pt states she will have 24hr care at home, can manage from w/c level if needed. Will follow in acute setting  Pt currently with functional limitations due to the deficits listed below (see PT Problem List). Pt will benefit from skilled PT to increase their independence and safety with mobility to allow discharge to the venue listed below.       Follow Up Recommendations Home health PT;Supervision for mobility/OOB    Equipment Recommendations  Rolling walker with 5" wheels    Recommendations for Other Services       Precautions / Restrictions Precautions Precautions: Fall Required Braces or Orthoses: Splint/Cast Splint/Cast: RLE splint Restrictions RLE Weight Bearing: Non weight bearing      Mobility  Bed Mobility Overal bed mobility: Needs Assistance Bed Mobility: Supine to Sit;Sit to Supine     Supine to sit: Min assist Sit to supine: Min assist   General bed mobility comments: min assist with RL E  Transfers Overall transfer level: Needs assistance Equipment used: Rolling walker (2 wheeled) Transfers: Sit to/from [facility] Sit to Stand: Min assist Stand pivot transfers: Min assist       General transfer comment: assist to rise and stabilize, cues for  safety, hand placement, and NWB on R  Ambulation/Gait             General Gait Details: pt declines d/t pain  Stairs            Wheelchair Mobility    Modified Rankin (Stroke Patients Only)       Balance Overall balance assessment: Needs assistance Sitting-balance support: No upper extremity supported;Feet supported Sitting balance-Leahy Scale: Fair     Standing balance support: During functional activity;Bilateral upper extremity supported Standing balance-Leahy Scale: Poor Standing balance comment: reliant on UEs and external assist                             Pertinent Vitals/Pain Pain Assessment: 0-10 Pain Score: 8  Pain Location: right LE Pain Descriptors / Indicators: Aching Pain Intervention(s): Limited activity within patient's tolerance;Monitored during session;Premedicated before session    Home Living Family/patient expects to be discharged to:: Private residence Living Arrangements: Spouse/significant other;Children Available Help at Discharge: [name]) Type of Home: House Home Access: Stairs to enter Entrance Stairs-Rails: Right;Left;Can reach both Entrance Stairs-Number of Steps: 4 Home Layout: One level;Laundry or work area in [place]: Environmental consultant - 4 wheels;Wheelchair - [employer];Shower seat      Prior Function Level of Independence: Needs assistance   Gait / Transfers Assistance Needed: no assistive device use in home; fatigues easily  ADL's / Homemaking Assistance Needed: cooks, does wash dishes and sweeps, does not go to basement to do laundry (as steps need to be replaced)  Hand Dominance        Extremity/Trunk Assessment   Upper Extremity Assessment Upper Extremity Assessment: Overall WFL for tasks assessed    Lower Extremity Assessment Lower Extremity Assessment: RLE deficits/detail RLE Deficits / Details: limited by pain and ankle splint; knee and hip grossly WFL RLE: Unable to  fully assess due to pain;Unable to fully assess due to immobilization       Communication   Communication: No difficulties  Cognition Arousal/Alertness: Awake/alert Behavior During Therapy: WFL for tasks assessed/performed Overall Cognitive Status: Within Functional Limits for tasks assessed                                        General Comments      Exercises     Assessment/Plan    PT Assessment Patient needs continued PT services  PT Problem List Decreased strength;Decreased activity tolerance;Decreased mobility;Decreased balance;Pain;Decreased knowledge of precautions       PT Treatment Interventions DME instruction;Gait training;Functional mobility training;Therapeutic activities;Therapeutic exercise;Patient/family education    PT Goals (Current goals can be found in the Care Plan section)  Acute Rehab PT Goals PT Goal Formulation: With patient Time For Goal Achievement: 10/23/18 Potential to Achieve Goals: Good    Frequency Min 3X/week   Barriers to discharge        Co-evaluation               AM-PAC PT "6 Clicks" Mobility  Outcome Measure Help needed turning from your back to your side while in a flat bed without using bedrails?: A Little Help needed moving from lying on your back to sitting on the side of a flat bed without using bedrails?: A Little Help needed moving to and from a bed to a chair (including a wheelchair)?: A Little Help needed standing up from a chair using your arms (e.g., wheelchair or bedside chair)?: A Lot Help needed to walk in hospital room?: A Lot Help needed climbing 3-5 steps with a railing? : Total 6 Click Score: 14    End of Session Equipment Utilized During Treatment: Gait belt Activity Tolerance: Patient tolerated treatment well;Patient limited by pain Patient left: with call bell/phone within reach;in bed;with bed alarm set   PT Visit Diagnosis: Difficulty in walking, not elsewhere classified  (R26.2);History of falling (Z91.81)    Time: 4287-6811 PT Time Calculation (min) (ACUTE ONLY): 26 min   Charges:   PT Evaluation $PT Eval Low Complexity: 1 Low PT Treatments $Gait Training: 8-22 mins        Kenyon Ana, PT  Pager: [phone] Acute Rehab Dept [facility]): [phone]   10/09/2018   [facility] 10/09/2018, 1:40 PM

## 2018-10-09 NOTE — Progress Notes (Signed)
PROGRESS NOTE    April Branch  ZOX:[id number] DOB: 11/26/1945 DOA: 10/07/2018 PCP: [name], MD    Brief Narrative:  73 y.o. female with history of chronic kidney disease stage III baseline creatinine around 1.4, hypertension, diabetes mellitus type 2 on insulin pump, chronic anemia, chronic pain and interstitial lung disease and pulmonary nodule being followed by pulmonologist had a recent CAT scan was brought to the ER patient was found to have multiple falls.  Patient states she tripped and fell while walking.  Her neighbor called EMS and was brought to the ER.  Per ER physician patient also was confused.  ED Course: In the ER patient had a CT head and C-spine which were unremarkable.  EKG shows normal sinus rhythm intraventricular conduction delay.  X-ray of the right ankle was done which since patient is complaining of ankle pain which shows distal fibula fracture and patient was placed on a splint.  Urine drugs show positive for opiates.  Patient is mildly confused and was attributed to likely patient taking opiates and gabapentin in the setting of acute renal failure.  Patient also was hypoglycemic and patient's insulin pump was discontinued.  Patient was given fluids D50 and admitted for acute encephalopathy right fibula fracture acute renal failure.  Assessment & Plan:   Principal Problem:   Acute encephalopathy Active Problems:   History of malignant neoplasm of kidney   Hypothyroidism   Insulin dependent type 2 diabetes mellitus, controlled (HCC)   Essential hypertension   COPD (chronic obstructive pulmonary disease) (HCC)   HX, PERSONAL, VENOUS THROMBOSIS/EMBOLISM   CAD (coronary artery disease)   PVD (peripheral vascular disease) (HCC)   Acute kidney injury superimposed on CKD (HCC)   Anemia   Bradycardia   CKD (chronic kidney disease), stage III ([place])  1. Acute encephalopathy 1. Seemed much improved. Pt alert and oriented this AM 2. Suspected secondary to  sedating meds in setting of dehydration 3. Stable this AM 2. Right fibula fracture 1. Currently with splint in place 2. Appreciate input by Orthopedic Surgery 3. Plan to continue splint, PT eval, and outpatient follow up 3. Acute on chronic kidney disease stage III 1. Mucus membranes remain dry this AM and pt is still complaining of increased thirst 2. Suspect continued dehydration. Will continue pt on gentle IVF hydration 3. Diuretic remains on hold 4. Recheck bmet in AM 4. Diabetes 2 with Hypoglycemia in the setting of worsening renal function 1. Will continue on SSI coverage for now 2. Family to bring insulin pump today. Plan to resume when pump arrives 5. COPD 1. No wheezing at this time 2. Stable at present 6. Hypothyroidism 1. Continue on synthroid as this time 2. Recent thyroid studies within normal limits 7. Peripheral vascular disease 1. Continue on Pletal. 2. Seems stable at this time 8. Chronic Anemia 1. Follow CBC trends. 9. Hypertension 1. Continue on metoprolol and amlodipine. 2. Stable 10. Dehydration 1. On exam, mucus membranes still appear dry and patient still complains of increased thirst 2. Continuing to hold diuretic 3. Continue gentle IVF hydration 11. Tachycardia 1. New this AM 2. Recent thyroid study reviewed, within normal limits 3. On beta blocker. Continue IVF hydration as tolerated 4. Will order PRN labetalol for HR>120 with hold parameters  DVT prophylaxis: Lovenox subQ Code Status: DNR Family Communication: Pt in room, updated emergency contact over phone 8/15 Disposition Plan: Uncertain at this time  Consultants:   Orthopedic Surgery  Procedures:     Antimicrobials: Anti-infectives (From admission,  onward)   None      Subjective: Feeling better today  Objective: Vitals:   10/08/18 1542 10/08/18 2125 10/09/18 0523 10/09/18 0728  BP:  118/66 (!) 150/64   Pulse: (!) 104 98 (!) 122   Resp: 18 18 17    Temp:  98.4 F (36.9  C) 98.3 F (36.8 C)   TempSrc:  Oral Oral   SpO2:  90% (!) 88% 91%  Weight:   77 kg     Intake/Output Summary (Last 24 hours) at 10/09/2018 1315 Last data filed at 10/08/2018 1733 Gross per 24 hour  Intake 368.03 ml  Output 950 ml  Net -581.97 ml   Filed Weights   10/09/18 0523  Weight: 77 kg    Examination: General exam: Awake, laying in bed, in nad, mucus membranes dry Respiratory system: Normal respiratory effort, no wheezing Cardiovascular system: tachycardic, s1, s2 Gastrointestinal system: Soft, nondistended, positive BS Central nervous system: CN2-12 grossly intact, strength intact Extremities: Perfused, no clubbing Skin: Normal skin turgor, no notable skin lesions seen Psychiatry: Mood normal // no visual hallucinations   Data Reviewed: I have personally reviewed following labs and imaging studies  CBC: Recent Labs  Lab 10/07/18 1904 10/07/18 2358 10/08/18 0550  WBC 14.9* 14.4* 12.2*  NEUTROABS 12.3* 11.6* 9.3*  HGB 11.1* 11.2* 10.6*  HCT 34.4* 34.3* 33.0*  MCV 85.1 84.9 84.8  PLT 298 310 124   Basic Metabolic Panel: Recent Labs  Lab 10/07/18 1904 10/08/18 0550 10/09/18 0548  NA 138 139 137  K 3.4* 3.4* 4.2  CL 104 107 109  CO2 21* 22 20*  GLUCOSE 68* 207* 256*  BUN 35* 30* 26*  CREATININE 2.68* 2.23* 1.64*  CALCIUM 8.6* 8.5* 8.3*   GFR: Estimated Creatinine Clearance: 29.6 mL/min (A) (by C-G formula based on SCr of 1.64 mg/dL (H)). Liver Function Tests: Recent Labs  Lab 10/07/18 1904 10/08/18 0550  AST 21 15  ALT 17 15  ALKPHOS 89 90  BILITOT 0.1* 0.3  PROT 7.3 6.6  ALBUMIN 3.6 3.3*   No results for input(s): LIPASE, AMYLASE in the last 168 hours. Recent Labs  Lab 10/07/18 1904  AMMONIA 17   Coagulation Profile: No results for input(s): INR, PROTIME in the last 168 hours. Cardiac Enzymes: No results for input(s): CKTOTAL, CKMB, CKMBINDEX, TROPONINI in the last 168 hours. BNP (last 3 results) No results for input(s): PROBNP in  the last 8760 hours. HbA1C: No results for input(s): HGBA1C in the last 72 hours. CBG: Recent Labs  Lab 10/08/18 1920 10/08/18 2320 10/09/18 0417 10/09/18 0751 10/09/18 1157  GLUCAP 265* 179* 256* 185* 161*   Lipid Profile: No results for input(s): CHOL, HDL, LDLCALC, TRIG, CHOLHDL, LDLDIRECT in the last 72 hours. Thyroid Function Tests: No results for input(s): TSH, T4TOTAL, FREET4, T3FREE, THYROIDAB in the last 72 hours. Anemia Panel: No results for input(s): VITAMINB12, FOLATE, FERRITIN, TIBC, IRON, RETICCTPCT in the last 72 hours. Sepsis Labs: No results for input(s): PROCALCITON, LATICACIDVEN in the last 168 hours.  Recent Results (from the past 240 hour(s))  SARS Coronavirus 2 [facility] order, Performed in [facility] hospital lab) Nasopharyngeal Nasopharyngeal Swab     Status: None   Collection Time: 10/07/18  7:04 PM   Specimen: Nasopharyngeal Swab  Result Value Ref Range Status   SARS Coronavirus 2 NEGATIVE NEGATIVE Final    Comment: (NOTE) If result is NEGATIVE SARS-CoV-2 target nucleic acids are NOT DETECTED. The SARS-CoV-2 RNA is generally detectable in upper and lower  respiratory  specimens during the acute phase of infection. The lowest  concentration of SARS-CoV-2 viral copies this assay can detect is 250  copies / mL. A negative result does not preclude SARS-CoV-2 infection  and should not be used as the sole basis for treatment or other  patient management decisions.  A negative result may occur with  improper specimen collection / handling, submission of specimen other  than nasopharyngeal swab, presence of viral mutation(s) within the  areas targeted by this assay, and inadequate number of viral copies  (<250 copies / mL). A negative result must be combined with clinical  observations, patient history, and epidemiological information. If result is POSITIVE SARS-CoV-2 target nucleic acids are DETECTED. The SARS-CoV-2 RNA is generally detectable in upper  and lower  respiratory specimens dur ing the acute phase of infection.  Positive  results are indicative of active infection with SARS-CoV-2.  Clinical  correlation with patient history and other diagnostic information is  necessary to determine patient infection status.  Positive results do  not rule out bacterial infection or co-infection with other viruses. If result is PRESUMPTIVE POSTIVE SARS-CoV-2 nucleic acids MAY BE PRESENT.   A presumptive positive result was obtained on the submitted specimen  and confirmed on repeat testing.  While 2019 novel coronavirus  (SARS-CoV-2) nucleic acids may be present in the submitted sample  additional confirmatory testing may be necessary for epidemiological  and / or clinical management purposes  to differentiate between  SARS-CoV-2 and other Sarbecovirus currently known to infect humans.  If clinically indicated additional testing with an alternate test  methodology [phone]) is advised. The SARS-CoV-2 RNA is generally  detectable in upper and lower respiratory sp ecimens during the acute  phase of infection. The expected result is Negative. Fact Sheet for Patients:  StrictlyIdeas.no Fact Sheet for Healthcare Providers: BankingDealers.co.za This test is not yet approved or cleared by the [place] FDA and has been authorized for detection and/or diagnosis of SARS-CoV-2 by FDA under an Emergency Use Authorization (EUA).  This EUA will remain in effect (meaning this test can be used) for the duration of the COVID-19 declaration under Section 564(b)(1) of the Act, 21 U.S.C. section 360bbb-3(b)(1), unless the authorization is terminated or revoked sooner. Performed at [facility], [place] [address], [place], [place] [address]      Radiology Studies: Dg Chest 2 View  Result Date: 10/07/2018 CLINICAL DATA:  Weakness EXAM: CHEST - 2 VIEW COMPARISON:  08/15/2018, CT 09/29/2018  FINDINGS: Cardiomegaly with vascular congestion. No pleural effusion or pneumothorax. Aortic atherosclerosis. Vague nodular opacity in the right mid to upper lung is noted, no correlate on CT chest follow-up 09/29/2018. IMPRESSION: Cardiomegaly with vascular congestion Electronically Signed   By: Donavan Foil M.D.   On: 10/07/2018 19:48   Dg Ankle Complete Right  Result Date: 10/07/2018 CLINICAL DATA:  Weakness, fall EXAM: RIGHT ANKLE - COMPLETE 3+ VIEW COMPARISON:  None. FINDINGS: Acute oblique fracture distal shaft of the fibula above the ankle joint. Minimal less than 1/4 bone with lateral displacement distal fracture fragment. Ankle mortise is symmetric. Plantar calcaneal spur. IMPRESSION: Sure acute minimally displaced distal fibular fracture Electronically Signed   By: Donavan Foil M.D.   On: 10/07/2018 19:49   Ct Head Wo Contrast  Result Date: 10/07/2018 CLINICAL DATA:  Ataxia, post head trauma; C-spine fx, traumatic. Multiple falls. Weakness. EXAM: CT HEAD WITHOUT CONTRAST CT CERVICAL SPINE WITHOUT CONTRAST TECHNIQUE: Multidetector CT imaging of the head and cervical spine was performed following  the standard protocol without intravenous contrast. Multiplanar CT image reconstructions of the cervical spine were also generated. COMPARISON:  Head CT 03/16/2018 FINDINGS: CT HEAD FINDINGS Brain: No evidence of acute infarction, hemorrhage, hydrocephalus, extra-axial collection or mass lesion/mass effect. Age related atrophy. Similar chronic small vessel ischemia. Vascular: Atherosclerosis of skullbase vasculature without hyperdense vessel or abnormal calcification. Skull: No fracture or focal lesion. Sinuses/Orbits: Postsurgical change in the right maxillary sinus. No acute findings. Bilateral cataract resection. Other: None. CT CERVICAL SPINE FINDINGS Alignment: Normal. Skull base and vertebrae: No acute fracture. Vertebral body heights are maintained. The dens and skull base are intact. Non fusion  posterior arch of C1, normal variant. Soft tissues and spinal canal: No prevertebral fluid or swelling. No visible canal hematoma. Disc levels: Disc space narrowing and endplate spurring most prominent at C4-C5. Scattered minor facet hypertrophy. Upper chest: No acute findings, allowing for motion artifact. Other: Carotid calcifications. IMPRESSION: 1. No acute intracranial abnormality. No skull fracture. Stable atrophy and chronic small vessel ischemia. 2. Mild degenerative change in the cervical spine without acute fracture or subluxation. 3. Carotid and skullbase atherosclerosis. Electronically Signed   By: Keith Rake M.D.   On: 10/07/2018 20:47   Ct Cervical Spine Wo Contrast  Result Date: 10/07/2018 CLINICAL DATA:  Ataxia, post head trauma; C-spine fx, traumatic. Multiple falls. Weakness. EXAM: CT HEAD WITHOUT CONTRAST CT CERVICAL SPINE WITHOUT CONTRAST TECHNIQUE: Multidetector CT imaging of the head and cervical spine was performed following the standard protocol without intravenous contrast. Multiplanar CT image reconstructions of the cervical spine were also generated. COMPARISON:  Head CT 03/16/2018 FINDINGS: CT HEAD FINDINGS Brain: No evidence of acute infarction, hemorrhage, hydrocephalus, extra-axial collection or mass lesion/mass effect. Age related atrophy. Similar chronic small vessel ischemia. Vascular: Atherosclerosis of skullbase vasculature without hyperdense vessel or abnormal calcification. Skull: No fracture or focal lesion. Sinuses/Orbits: Postsurgical change in the right maxillary sinus. No acute findings. Bilateral cataract resection. Other: None. CT CERVICAL SPINE FINDINGS Alignment: Normal. Skull base and vertebrae: No acute fracture. Vertebral body heights are maintained. The dens and skull base are intact. Non fusion posterior arch of C1, normal variant. Soft tissues and spinal canal: No prevertebral fluid or swelling. No visible canal hematoma. Disc levels: Disc space  narrowing and endplate spurring most prominent at C4-C5. Scattered minor facet hypertrophy. Upper chest: No acute findings, allowing for motion artifact. Other: Carotid calcifications. IMPRESSION: 1. No acute intracranial abnormality. No skull fracture. Stable atrophy and chronic small vessel ischemia. 2. Mild degenerative change in the cervical spine without acute fracture or subluxation. 3. Carotid and skullbase atherosclerosis. Electronically Signed   By: Keith Rake M.D.   On: 10/07/2018 20:47    Scheduled Meds:  aspirin  325 mg Oral Daily   cilostazol  100 mg Oral BID   DULoxetine  60 mg Oral Daily   enoxaparin (LOVENOX) injection  30 mg Subcutaneous Q24H   ferrous sulfate  325 mg Oral Q breakfast   insulin aspart  0-15 Units Subcutaneous Q4H   levothyroxine  150 mcg Oral QAC breakfast   metoprolol succinate  25 mg Oral Daily   pantoprazole  20 mg Oral Daily   Continuous Infusions:  sodium chloride 75 mL/hr at 10/08/18 1500     LOS: 0 days   Marylu Lund, MD [name] Pager On Amion  If 7PM-7AM, please contact night-coverage 10/09/2018, 1:15 PM

## 2018-10-09 NOTE — Progress Notes (Signed)
Pt states that she takes her Cymbalta BID- in morning and at bedtime. Pt would like meds reviewed in AM.

## 2018-10-09 NOTE — Care Management Obs Status (Signed)
[place] NOTIFICATION   Patient Details  Name: April Branch MRN: [id number] Date of Birth: 08-25-1945   Medicare Observation Status Notification Given:  Yes    Joaquin Courts, RN 10/09/2018, 9:19 AM

## 2018-10-10 ENCOUNTER — Other Ambulatory Visit: Payer: Self-pay | Admitting: Endocrinology

## 2018-10-10 ENCOUNTER — Inpatient Hospital Stay (HOSPITAL_COMMUNITY): Payer: Medicare Other

## 2018-10-10 DIAGNOSIS — I4891 Unspecified atrial fibrillation: Secondary | ICD-10-CM

## 2018-10-10 LAB — BASIC METABOLIC PANEL
Anion gap: 7 (ref 5–15)
BUN: 20 mg/dL (ref 8–23)
CO2: 23 mmol/L (ref 22–32)
Calcium: 8.6 mg/dL — ABNORMAL LOW (ref 8.9–10.3)
Chloride: 111 mmol/L (ref 98–111)
Creatinine, Ser: 1.42 mg/dL — ABNORMAL HIGH (ref 0.44–1.00)
GFR calc Af Amer: 43 mL/min — ABNORMAL LOW (ref >60)
GFR calc non Af Amer: 37 mL/min — ABNORMAL LOW (ref >60)
Glucose, Bld: 65 mg/dL — ABNORMAL LOW (ref 70–99)
Potassium: 3.9 mmol/L (ref 3.5–5.1)
Sodium: 141 mmol/L (ref 135–145)

## 2018-10-10 LAB — GLUCOSE, CAPILLARY: Glucose-Capillary: 68 mg/dL — ABNORMAL LOW (ref 70–99)

## 2018-10-10 LAB — ECHOCARDIOGRAM COMPLETE
Height: 60.866 in
Weight: 2715.19 oz

## 2018-10-10 LAB — MAGNESIUM: Magnesium: 1.5 mg/dL — ABNORMAL LOW (ref 1.7–2.4)

## 2018-10-10 MED ORDER — HYDRALAZINE HCL 20 MG/ML IJ SOLN: 5.0000 mg | INTRAMUSCULAR | Status: DC | PRN

## 2018-10-10 MED ORDER — RIVAROXABAN 15 MG PO TABS
15.0000 mg | ORAL_TABLET | Freq: Every day | ORAL | Status: DC
  Administered 2018-10-10 – 2018-10-11 (×2): 15 mg via ORAL
  Filled 2018-10-10 (×2): qty 1

## 2018-10-10 NOTE — Progress Notes (Signed)
  Echocardiogram 2D Echocardiogram has been performed.  April Branch 10/10/2018, 1:56 PM

## 2018-10-10 NOTE — Progress Notes (Signed)
Patient complains of blurred vision states that someone told her may be blood pressure medicine. Vitals 149/63. 103 O2 95%. Will page on call.

## 2018-10-10 NOTE — Progress Notes (Signed)
PROGRESS NOTE    April Branch  AOZ:[id number] DOB: September 29, 1945 DOA: 10/07/2018 PCP: [name], MD    Brief Narrative:  73 y.o. female with history of chronic kidney disease stage III baseline creatinine around 1.4, hypertension, diabetes mellitus type 2 on insulin pump, chronic anemia, chronic pain and interstitial lung disease and pulmonary nodule being followed by pulmonologist had a recent CAT scan was brought to the ER patient was found to have multiple falls.  Patient states she tripped and fell while walking.  Her neighbor called EMS and was brought to the ER.  Per ER physician patient also was confused.  ED Course: In the ER patient had a CT head and C-spine which were unremarkable.  EKG shows normal sinus rhythm intraventricular conduction delay.  X-ray of the right ankle was done which since patient is complaining of ankle pain which shows distal fibula fracture and patient was placed on a splint.  Urine drugs show positive for opiates.  Patient is mildly confused and was attributed to likely patient taking opiates and gabapentin in the setting of acute renal failure.  Patient also was hypoglycemic and patient's insulin pump was discontinued.  Patient was given fluids D50 and admitted for acute encephalopathy right fibula fracture acute renal failure.  Assessment & Plan:   Principal Problem:   Acute encephalopathy Active Problems:   History of malignant neoplasm of kidney   Hypothyroidism   Insulin dependent type 2 diabetes mellitus, controlled (HCC)   Essential hypertension   COPD (chronic obstructive pulmonary disease) (HCC)   HX, PERSONAL, VENOUS THROMBOSIS/EMBOLISM   CAD (coronary artery disease)   PVD (peripheral vascular disease) (HCC)   Acute kidney injury superimposed on CKD (HCC)   Anemia   Bradycardia   CKD (chronic kidney disease), stage III ([place])   AKI (acute kidney injury) ([place])  1. Acute encephalopathy 1. Seemed much improved. Pt alert and oriented  this AM and fully conversant 2. Suspected secondary to sedating meds in setting of dehydration 3. Stable this AM 2. Right fibula fracture 1. Currently with splint in place 2. Appreciate input by Orthopedic Surgery 3. Plan to continue splint with outpatient follow up 4. PT recommends home health PT 3. Acute on chronic kidney disease stage III 1. Mucus membranes improved, still appears slightly dry 2. Suspect dehydration. Will continue pt on gentle IVF hydration 3. Diuretic remains on hold 4. Repeat bmet in AM 4. Diabetes 2 with Hypoglycemia in the setting of worsening renal function 1. Had resumed insulin pump, however pt hypoglycemic this AM 2. Appreciate input by Diabetic Coordinator. Will follow 5. COPD 1. No wheezing at this time 2. Stable at present 6. Hypothyroidism 1. Continue on synthroid as this time 2. Recent thyroid studies within normal limits 7. Peripheral vascular disease 1. Pt is continued on Pletal. 2. Seems stable at this time 8. Chronic Anemia 1. Follow CBC trends. 9. Hypertension 1. Continue on metoprolol and amlodipine. 2. BP stable at this time 10. Dehydration 1. On exam, mucus membranes improved, still mildly dry 2. Continuing to hold diuretic 3. Continue gentle IVF hydration 11. Aflutter 1. New this admit 2. Ordered and reviewed ekg, confirming flutter 3. Recent thyroid study reviewed, within normal limits 4. On beta blocker. Continue IVF hydration as tolerated 5. continue PRN labetalol for HR>120 with hold parameters 6. Have ordered 2d echo, pending results. If unremarkable, then consider starting therapeutic anticogulation and follow up with Cardiology. Pt last saw Dr. Johnsie Cancel on 04/06/18  DVT prophylaxis: Lovenox subQ Code  Status: DNR Family Communication: Pt in room, updated emergency contact over phone 8/15 Disposition Plan: Uncertain at this time  Consultants:   Orthopedic Surgery  Procedures:     Antimicrobials: Anti-infectives (From  admission, onward)   None      Subjective: States feeling better  Objective: Vitals:   10/09/18 2045 10/10/18 0534 10/10/18 0821 10/10/18 1358  BP: 136/61 (!) 157/66 (!) 157/66 (!) 144/62  Pulse: 97 97 97 93  Resp: 20 20 18 18   Temp: 98.2 F (36.8 C) 98.7 F (37.1 C) 98.7 F (37.1 C) 98.4 F (36.9 C)  TempSrc: Oral Oral Oral   SpO2: 96% 96%  98%  Weight:   77 kg   Height:   5' 0.87" (1.546 m)     Intake/Output Summary (Last 24 hours) at 10/10/2018 1542 Last data filed at 10/10/2018 1030 Gross per 24 hour  Intake 977.26 ml  Output 1700 ml  Net -722.74 ml   Filed Weights   10/09/18 0523 10/10/18 0821  Weight: 77 kg 77 kg    Examination: General exam: Conversant, in no acute distress Respiratory system: normal chest rise, clear, no audible wheezing Cardiovascular system: irregularly irregular rhythm, s1-s2 Gastrointestinal system: Nondistended, nontender, pos BS Central nervous system: No seizures, no tremors Extremities: No cyanosis, no joint deformities Skin: No rashes, no pallor Psychiatry: Affect normal // no auditory hallucinations   Data Reviewed: I have personally reviewed following labs and imaging studies  CBC: Recent Labs  Lab 10/07/18 1904 10/07/18 2358 10/08/18 0550  WBC 14.9* 14.4* 12.2*  NEUTROABS 12.3* 11.6* 9.3*  HGB 11.1* 11.2* 10.6*  HCT 34.4* 34.3* 33.0*  MCV 85.1 84.9 84.8  PLT 298 310 626   Basic Metabolic Panel: Recent Labs  Lab 10/07/18 1904 10/08/18 0550 10/09/18 0548 10/10/18 0531  NA 138 139 137 141  K 3.4* 3.4* 4.2 3.9  CL 104 107 109 111  CO2 21* 22 20* 23  GLUCOSE 68* 207* 256* 65*  BUN 35* 30* 26* 20  CREATININE 2.68* 2.23* 1.64* 1.42*  CALCIUM 8.6* 8.5* 8.3* 8.6*   GFR: Estimated Creatinine Clearance: 34 mL/min (A) (by C-G formula based on SCr of 1.42 mg/dL (H)). Liver Function Tests: Recent Labs  Lab 10/07/18 1904 10/08/18 0550  AST 21 15  ALT 17 15  ALKPHOS 89 90  BILITOT 0.1* 0.3  PROT 7.3 6.6   ALBUMIN 3.6 3.3*   No results for input(s): LIPASE, AMYLASE in the last 168 hours. Recent Labs  Lab 10/07/18 1904  AMMONIA 17   Coagulation Profile: No results for input(s): INR, PROTIME in the last 168 hours. Cardiac Enzymes: No results for input(s): CKTOTAL, CKMB, CKMBINDEX, TROPONINI in the last 168 hours. BNP (last 3 results) No results for input(s): PROBNP in the last 8760 hours. HbA1C: No results for input(s): HGBA1C in the last 72 hours. CBG: Recent Labs  Lab 10/09/18 0751 10/09/18 1157 10/09/18 1609 10/09/18 2047 10/10/18 0132  GLUCAP 185* 161* 231* 85 68*   Lipid Profile: No results for input(s): CHOL, HDL, LDLCALC, TRIG, CHOLHDL, LDLDIRECT in the last 72 hours. Thyroid Function Tests: No results for input(s): TSH, T4TOTAL, FREET4, T3FREE, THYROIDAB in the last 72 hours. Anemia Panel: No results for input(s): VITAMINB12, FOLATE, FERRITIN, TIBC, IRON, RETICCTPCT in the last 72 hours. Sepsis Labs: No results for input(s): PROCALCITON, LATICACIDVEN in the last 168 hours.  Recent Results (from the past 240 hour(s))  SARS Coronavirus 2 [facility] order, Performed in [facility] hospital lab) Nasopharyngeal Nasopharyngeal Swab  Status: None   Collection Time: 10/07/18  7:04 PM   Specimen: Nasopharyngeal Swab  Result Value Ref Range Status   SARS Coronavirus 2 NEGATIVE NEGATIVE Final    Comment: (NOTE) If result is NEGATIVE SARS-CoV-2 target nucleic acids are NOT DETECTED. The SARS-CoV-2 RNA is generally detectable in upper and lower  respiratory specimens during the acute phase of infection. The lowest  concentration of SARS-CoV-2 viral copies this assay can detect is 250  copies / mL. A negative result does not preclude SARS-CoV-2 infection  and should not be used as the sole basis for treatment or other  patient management decisions.  A negative result may occur with  improper specimen collection / handling, submission of specimen other  than nasopharyngeal  swab, presence of viral mutation(s) within the  areas targeted by this assay, and inadequate number of viral copies  (<250 copies / mL). A negative result must be combined with clinical  observations, patient history, and epidemiological information. If result is POSITIVE SARS-CoV-2 target nucleic acids are DETECTED. The SARS-CoV-2 RNA is generally detectable in upper and lower  respiratory specimens dur ing the acute phase of infection.  Positive  results are indicative of active infection with SARS-CoV-2.  Clinical  correlation with patient history and other diagnostic information is  necessary to determine patient infection status.  Positive results do  not rule out bacterial infection or co-infection with other viruses. If result is PRESUMPTIVE POSTIVE SARS-CoV-2 nucleic acids MAY BE PRESENT.   A presumptive positive result was obtained on the submitted specimen  and confirmed on repeat testing.  While 2019 novel coronavirus  (SARS-CoV-2) nucleic acids may be present in the submitted sample  additional confirmatory testing may be necessary for epidemiological  and / or clinical management purposes  to differentiate between  SARS-CoV-2 and other Sarbecovirus currently known to infect humans.  If clinically indicated additional testing with an alternate test  methodology [phone]) is advised. The SARS-CoV-2 RNA is generally  detectable in upper and lower respiratory sp ecimens during the acute  phase of infection. The expected result is Negative. Fact Sheet for Patients:  StrictlyIdeas.no Fact Sheet for Healthcare Providers: BankingDealers.co.za This test is not yet approved or cleared by the [place] FDA and has been authorized for detection and/or diagnosis of SARS-CoV-2 by FDA under an Emergency Use Authorization (EUA).  This EUA will remain in effect (meaning this test can be used) for the duration of the COVID-19 declaration  under Section 564(b)(1) of the Act, 21 U.S.C. section 360bbb-3(b)(1), unless the authorization is terminated or revoked sooner. Performed at [facility], [place] [address] [place],  [address]      Radiology Studies: No results found.  Scheduled Meds: . aspirin  325 mg Oral Daily  . cilostazol  100 mg Oral BID  . DULoxetine  60 mg Oral Daily  . enoxaparin (LOVENOX) injection  30 mg Subcutaneous Q24H  . ferrous sulfate  325 mg Oral Q breakfast  . insulin pump   Subcutaneous TID AC, HS, 0200  . levothyroxine  150 mcg Oral QAC breakfast  . metoprolol succinate  25 mg Oral Daily  . pantoprazole  20 mg Oral Daily   Continuous Infusions: . sodium chloride 50 mL/hr at 10/10/18 1149     LOS: 1 day   Marylu Lund, MD [name] Pager On Amion  If 7PM-7AM, please contact night-coverage 10/10/2018, 3:42 PM

## 2018-10-10 NOTE — Discharge Instructions (Signed)

## 2018-10-10 NOTE — Progress Notes (Signed)
[place] for xarelto Indication: new onset atrial fibrillation  No Known Allergies  Patient Measurements: Height: 5' 0.87" (154.6 cm) Weight: 169 lb 11.2 oz (77 kg) IBW/kg (Calculated) : 47.49 Heparin Dosing Weight:   Vital Signs: Temp: 98.4 F (36.9 C) (08/17 1358) Temp Source: Oral (08/17 0821) BP: 144/62 (08/17 1358) Pulse Rate: 93 (08/17 1358)  Labs: Recent Labs    10/07/18 1904 10/07/18 2358 10/08/18 0544 10/08/18 0550 10/09/18 0548 10/10/18 0531  HGB 11.1* 11.2*  --  10.6*  --   --   HCT 34.4* 34.3*  --  33.0*  --   --   PLT 298 310  --  285  --   --   CREATININE 2.68*  --   --  2.23* 1.64* 1.42*  TROPONINIHS 10  --  11  --   --   --     Estimated Creatinine Clearance: 34 mL/min (A) (by C-G formula based on SCr of 1.42 mg/dL (H)).   Assessment: Patient's a 73 y.o F with hx CKD 3 (BL scr 1.4) presented to the ED om 8/14 with c/o falls, right ankle pain and AMS.  X-ray showed distal fibula fracture (treated with splint).  Patient's now has new onset afib.  To start xarelto for afib.  Today, 10/10/2018: - scr improved to 1.42 (crcl~44 with TBW) - no bleeding documented   Plan:  - xarelto 15 mg PO daily (for crcl 15-50) - monitor for s/s bleeding - f/u renal function  Dannielle Baskins P 10/10/2018,6:24 PM

## 2018-10-10 NOTE — Progress Notes (Signed)
BP= 157/66. Patient has history of HTN. Patient has metoprolol as MD ordered. Will recheck BP later.

## 2018-10-10 NOTE — Progress Notes (Addendum)
Inpatient Diabetes Program Recommendations  AACE/ADA: New Consensus Statement on Inpatient Glycemic Control (2015)  Target Ranges:  Prepandial:   less than 140 mg/dL      Peak postprandial:   less than 180 mg/dL (1-2 hours)      Critically ill patients:  140 - 180 mg/dL   Lab Results  Component Value Date   GLUCAP 68 (L) 10/10/2018   HGBA1C 9.9 (A) 07/21/2018    Review of Glycemic Control  Diabetes history: DM2 Outpatient Diabetes medications: Insulin pump - OmniPod Current orders for Inpatient glycemic control: Insulin pump  Basal rate changes made on 09/05/2018 by Dr. [name]:  8 AM-11 AM = 1.9, 11 AM-5 PM = 2.3, 5 PM-10 PM = 2.5 and 10 PM = 2.0  (this is a reduction of 0.2 units/hr during the day, since pt was having hypos.) CF - 60 with Target of 125 mg/dL. Bolus presets 4 units breakfast, 4 units lunch and 4-6 units dinner  Inpatient Diabetes Program Recommendations:     Per Dr. [name], insulin pump settings are correct.   Hypo this morning of 68 and 65 mg/dL.   To speak with pt this afternoon.  Thank you. Lorenda Peck, RD, LDN, CDE Inpatient Diabetes Coordinator [phone]  Addendum: Spoke with pt this afternoon regarding her insulin pump. Settings are correct. Has appt with endo next week. Signed insulin pump contract. Discussed importance of allowing staff to monitor blood sugars with hospital CBG machine. Explained that pt could check her blood sugars with her meter, although pt would need to use our results to make adjustments with pump. Pt voiced understanding. Also discussed with RN.

## 2018-10-11 DIAGNOSIS — R001 Bradycardia, unspecified: Secondary | ICD-10-CM

## 2018-10-11 LAB — COMPREHENSIVE METABOLIC PANEL
ALT: 12 U/L (ref 0–44)
AST: 11 U/L — ABNORMAL LOW (ref 15–41)
Albumin: 2.6 g/dL — ABNORMAL LOW (ref 3.5–5.0)
Alkaline Phosphatase: 76 U/L (ref 38–126)
Anion gap: 9 (ref 5–15)
BUN: 18 mg/dL (ref 8–23)
CO2: 21 mmol/L — ABNORMAL LOW (ref 22–32)
Calcium: 8.7 mg/dL — ABNORMAL LOW (ref 8.9–10.3)
Chloride: 109 mmol/L (ref 98–111)
Creatinine, Ser: 1.32 mg/dL — ABNORMAL HIGH (ref 0.44–1.00)
GFR calc Af Amer: 47 mL/min — ABNORMAL LOW (ref >60)
GFR calc non Af Amer: 40 mL/min — ABNORMAL LOW (ref >60)
Glucose, Bld: 58 mg/dL — ABNORMAL LOW (ref 70–99)
Potassium: 4.7 mmol/L (ref 3.5–5.1)
Sodium: 139 mmol/L (ref 135–145)
Total Bilirubin: 0.3 mg/dL (ref 0.3–1.2)
Total Protein: 6.1 g/dL — ABNORMAL LOW (ref 6.5–8.1)

## 2018-10-11 LAB — CBC
HCT: 34.4 % — ABNORMAL LOW (ref 36.0–46.0)
Hemoglobin: 10.8 g/dL — ABNORMAL LOW (ref 12.0–15.0)
MCH: 27.5 pg (ref 26.0–34.0)
MCHC: 31.4 g/dL (ref 30.0–36.0)
MCV: 87.5 fL (ref 80.0–100.0)
Platelets: 260 10*3/uL (ref 150–400)
RBC: 3.93 MIL/uL (ref 3.87–5.11)
RDW: 14.9 % (ref 11.5–15.5)
WBC: 11.3 10*3/uL — ABNORMAL HIGH (ref 4.0–10.5)
nRBC: 0 % (ref 0.0–0.2)

## 2018-10-11 LAB — GLUCOSE, CAPILLARY
Glucose-Capillary: 53 mg/dL — ABNORMAL LOW (ref 70–99)
Glucose-Capillary: 70 mg/dL (ref 70–99)
Glucose-Capillary: 79 mg/dL (ref 70–99)

## 2018-10-11 MED ORDER — METOPROLOL SUCCINATE ER 50 MG PO TB24: 50.0000 mg | ORAL_TABLET | Freq: Every day | ORAL | 1 refills | Status: DC

## 2018-10-11 MED ORDER — ASPIRIN EC 81 MG PO TBEC: 81.0000 mg | DELAYED_RELEASE_TABLET | Freq: Every day | ORAL | 1 refills | Status: AC

## 2018-10-11 MED ORDER — METOPROLOL SUCCINATE ER 50 MG PO TB24
50.0000 mg | ORAL_TABLET | Freq: Every day | ORAL | Status: DC
  Administered 2018-10-11: 50 mg via ORAL
  Filled 2018-10-11: qty 1

## 2018-10-11 MED ORDER — RIVAROXABAN 15 MG PO TABS: 15.0000 mg | ORAL_TABLET | Freq: Every day | ORAL | 0 refills | Status: AC

## 2018-10-11 NOTE — Progress Notes (Signed)
CRITICAL VALUE ALERT  Critical Value:  BG = 53  Date & Time Notied:  10/11/2018 at 0801  Provider Notified: yes  Orders Received/Actions taken: orange juice and breakfast was given to patient. Patient is alert and oriented x 4. Will recheck BG in 30 minutes.

## 2018-10-11 NOTE — Discharge Summary (Addendum)
Physician Discharge Summary  April Branch PIR:[id number] DOB: 06-04-1945 DOA: 10/07/2018  PCP: [name], MD  Admit date: 10/07/2018 Discharge date: 10/11/2018  Admitted From: Home Disposition:  Home  Recommendations for Outpatient Follow-up:  1. Follow up with PCP in 1-2 weeks 2. Follow up with Cardiology as scheduled 3. Follow up with Orthopedic Surgery in 2 weeks  Home Health:PT, RN    Discharge Condition:Improved CODE STATUS:DNR Diet recommendation: Diabetic, heart healthy   Brief/Interim Summary: 73 y.o.femalewithhistory of chronic kidney disease stage III baseline creatinine around 1.4, hypertension, diabetes mellitus type 2 on insulin pump, chronic anemia, chronic pain and interstitial lung disease and pulmonary nodule being followed by pulmonologist had a recent CAT scan was brought to the ER patient was found to have multiple falls. Patient states she tripped and fell while walking. Her neighbor called EMS and was brought to the ER. Per ER physician patient also was confused.  ED Course:In the ER patient had a CT head and C-spine which were unremarkable. EKG shows normal sinus rhythm intraventricular conduction delay. X-ray of the right ankle was done which since patient is complaining of ankle pain which shows distal fibula fracture and patient was placed on a splint. Urine drugs show positive for opiates. Patient is mildly confused and was attributed to likely patient taking opiates and gabapentin in the setting of acute renal failure. Patient also was hypoglycemic and patient's insulin pump was discontinued. Patient was given fluids D50 and admitted for acute encephalopathy right fibula fracture acute renal failure.  Discharge Diagnoses:  Principal Problem:   Acute encephalopathy Active Problems:   History of malignant neoplasm of kidney   Hypothyroidism   Insulin dependent type 2 diabetes mellitus, controlled (HCC)   Essential hypertension   COPD  (chronic obstructive pulmonary disease) (HCC)   HX, PERSONAL, VENOUS THROMBOSIS/EMBOLISM   CAD (coronary artery disease)   PVD (peripheral vascular disease) (HCC)   Acute kidney injury superimposed on CKD (HCC)   Anemia   Bradycardia   CKD (chronic kidney disease), stage III ([facility])   AKI (acute kidney injury) ([facility])   1. Acute toxic metabolic encephalopathy 1. Now much improved. Pt alert and oriented this AM and fully conversant 2. Suspected secondary to sedating meds in setting of dehydration 3. Stable this AM 2. Right fibula fracture 1. Currently with splint in place 2. Appreciate input by Orthopedic Surgery 3. Plan to continue splint with outpatient follow up 4. PT recommends home health PT 3. Acute on chronic kidney disease stage III 1. Mucus membranes improved 2. Suspect dehydration that was improved with IVF hydration 3. Renal function returned to baseline 4. Diabetes 2 with Hypoglycemia in the setting of worsening renal function 1. Had resumed insulin pump, however pt with bouts of hypoglycemia 2. Appreciate input by Diabetic Coordinator. Recommendation to follow up closely with Endocrinologist and to continue current settings 3. Have advised pt to eat snacks regularly between meals and to eat full meals 5. COPD 1. No wheezing at this time 2. Stable at present 6. Hypothyroidism 1. Continue on synthroid as this time 2. Recent thyroid studies within normal limits 7. Peripheral vascular disease 1. Pt is continued on Pletal. 2. Seems stable at this time 8. Chronic Anemia 1. Remained hemodynamically stable 9. Hypertension 1. Continue on metoprolol and amlodipine. 2. BP stable at this time 10. Dehydration 1. Improved with IVF hydration 11. Aflutter 1. New this admit 2. Ordered and reviewed ekg, confirming flutter 3. Recent thyroid study reviewed, within normal limits 4. On  beta blocker, rate controlled. 5. Have ordered 2d echo,unremarkable. 6. Have started Xarelto  for secondary stroke prevention 7. Will have patient follow up with Cardiology as outpatient   Discharge Instructions   Allergies as of 10/11/2018   No Known Allergies     Medication List    STOP taking these medications   fludrocortisone 0.1 MG tablet Commonly known as: FLORINEF   gabapentin 300 MG capsule Commonly known as: NEURONTIN   hydrOXYzine 25 MG tablet Commonly known as: ATARAX/VISTARIL   nystatin 100000 UNIT/ML suspension Commonly known as: MYCOSTATIN   potassium chloride 10 MEQ tablet Commonly known as: K-DUR   predniSONE 10 MG tablet Commonly known as: DELTASONE   sulfamethoxazole-trimethoprim 800-160 MG tablet Commonly known as: BACTRIM DS     TAKE these medications   Accu-Chek FastClix Lancets Misc Use 4 per day to check blood sugar dx code E11.9   albuterol 108 (90 Base) MCG/ACT inhaler Commonly known as: VENTOLIN HFA Inhale 1-2 puffs into the lungs every 6 (six) hours as needed for wheezing or shortness of breath.   amLODipine 10 MG tablet Commonly known as: NORVASC Take 10 mg by mouth daily.   aspirin EC 81 MG tablet Take 1 tablet (81 mg total) by mouth daily. What changed:   medication strength  how much to take   cilostazol 100 MG tablet Commonly known as: PLETAL TAKE 1 TABLET BY MOUTH TWICE A DAY   Contour Next Test test strip Generic drug: glucose blood Use to test blood sugar 3 times daily E11.9   DULoxetine 60 MG capsule Commonly known as: CYMBALTA TAKE 1 CAPSULE BY MOUTH ONCE DAILY   ferrous sulfate 325 (65 FE) MG tablet Take 325 mg by mouth 2 (two) times daily.   furosemide 20 MG tablet Commonly known as: Lasix Take 1 tablet (20 mg total) by mouth daily.   HYDROcodone-acetaminophen 10-325 MG tablet Commonly known as: NORCO Take 1 tablet by mouth 3 (three) times daily as needed (for pain). Follow up with PCP if refills needed   insulin lispro 100 UNIT/ML injection Commonly known as: HumaLOG Inject 0.7 mLs (70  Units total) into the skin See admin instructions. Use 200 units every 3 days in insulin pump   Insulin Pen Needle 32G X 4 MM Misc Commonly known as: BD Pen Needle Nano U/F Use to inject insulin   ipratropium-albuterol 0.5-2.5 (3) MG/3ML Soln Commonly known as: DUONEB Take 3 mLs by nebulization every 6 (six) hours as needed.   lansoprazole 30 MG capsule Commonly known as: PREVACID Take 1 capsule (30 mg total) by mouth daily.   levothyroxine 150 MCG tablet Commonly known as: SYNTHROID TAKE 1 TABLET BY MOUTH ONCE DAILY   methylPREDNISolone 4 MG Tbpk tablet Commonly known as: MEDROL DOSEPAK Take 4 mg by mouth See admin instructions.   metoprolol succinate 50 MG 24 hr tablet Commonly known as: TOPROL-XL Take 1 tablet (50 mg total) by mouth daily. Take with or immediately following a meal. Start taking on: October 12, 2018 What changed:   medication strength  how much to take  additional instructions   OXYGEN Inhale 1 L into the lungs as needed (shortness of breath).   Rivaroxaban 15 MG Tabs tablet Commonly known as: XARELTO Take 1 tablet (15 mg total) by mouth daily with supper.   rosuvastatin 20 MG tablet Commonly known as: CRESTOR Take 20 mg by mouth daily.   tolterodine 4 MG 24 hr capsule Commonly known as: DETROL LA Take 4 mg by mouth  daily.   triamcinolone cream 0.1 % Commonly known as: KENALOG Apply 1 application topically 2 (two) times daily.      Follow-up Information    Malena Catholic, MD. Schedule an appointment as soon as possible for a visit in 2 week(s).   Contact information: [address] [place] [address] [phone]        Hassan, Sami, MD. Schedule an appointment as soon as possible for a visit in 1 week(s).   Specialty: Internal Medicine Contact information: [address] [place] [address] [phone]        Josue Hector, MD .   Specialty: Cardiology Contact information: [phone] [address] [place] [address] [phone]          No Known Allergies  Consultations:  Orthopedic Surgery  Procedures/Studies: Dg Chest 2 View  Result Date: 10/07/2018 CLINICAL DATA:  Weakness EXAM: CHEST - 2 VIEW COMPARISON:  08/15/2018, CT 09/29/2018 FINDINGS: Cardiomegaly with vascular congestion. No pleural effusion or pneumothorax. Aortic atherosclerosis. Vague nodular opacity in the right mid to upper lung is noted, no correlate on CT chest follow-up 09/29/2018. IMPRESSION: Cardiomegaly with vascular congestion Electronically Signed   By: Donavan Foil M.D.   On: 10/07/2018 19:48   Dg Ankle Complete Right  Result Date: 10/07/2018 CLINICAL DATA:  Weakness, fall EXAM: RIGHT ANKLE - COMPLETE 3+ VIEW COMPARISON:  None. FINDINGS: Acute oblique fracture distal shaft of the fibula above the ankle joint. Minimal less than 1/4 bone with lateral displacement distal fracture fragment. Ankle mortise is symmetric. Plantar calcaneal spur. IMPRESSION: Sure acute minimally displaced distal fibular fracture Electronically Signed   By: Donavan Foil M.D.   On: 10/07/2018 19:49   Ct Head Wo Contrast  Result Date: 10/07/2018 CLINICAL DATA:  Ataxia, post head trauma; C-spine fx, traumatic. Multiple falls. Weakness. EXAM: CT HEAD WITHOUT CONTRAST CT CERVICAL SPINE WITHOUT CONTRAST TECHNIQUE: Multidetector CT imaging of the head and cervical spine was performed following the standard protocol without intravenous contrast. Multiplanar CT image reconstructions of the cervical spine were also generated. COMPARISON:  Head CT 03/16/2018 FINDINGS: CT HEAD FINDINGS Brain: No evidence of acute infarction, hemorrhage, hydrocephalus, extra-axial collection or mass lesion/mass effect. Age related atrophy. Similar chronic small vessel ischemia. Vascular: Atherosclerosis of skullbase vasculature without hyperdense vessel or abnormal calcification. Skull: No fracture or focal lesion. Sinuses/Orbits: Postsurgical change in the  right maxillary sinus. No acute findings. Bilateral cataract resection. Other: None. CT CERVICAL SPINE FINDINGS Alignment: Normal. Skull base and vertebrae: No acute fracture. Vertebral body heights are maintained. The dens and skull base are intact. Non fusion posterior arch of C1, normal variant. Soft tissues and spinal canal: No prevertebral fluid or swelling. No visible canal hematoma. Disc levels: Disc space narrowing and endplate spurring most prominent at C4-C5. Scattered minor facet hypertrophy. Upper chest: No acute findings, allowing for motion artifact. Other: Carotid calcifications. IMPRESSION: 1. No acute intracranial abnormality. No skull fracture. Stable atrophy and chronic small vessel ischemia. 2. Mild degenerative change in the cervical spine without acute fracture or subluxation. 3. Carotid and skullbase atherosclerosis. Electronically Signed   By: Keith Rake M.D.   On: 10/07/2018 20:47   Ct Chest Wo Contrast  Result Date: 09/29/2018 CLINICAL DATA:  Follow-up pulmonary nodule EXAM: CT CHEST WITHOUT CONTRAST TECHNIQUE: Multidetector CT imaging of the chest was performed following the standard protocol without IV contrast. COMPARISON:  CT 04/03/2018, 05/10/2016 FINDINGS: Cardiovascular: Heart size is mildly enlarged, unchanged. No pericardial effusion. Thoracic aorta nonaneurysmal. There  are atherosclerotic calcifications of the aorta and coronary arteries. Mediastinum/Nodes: No enlarged mediastinal or axillary lymph nodes. Thyroid gland, trachea, and esophagus demonstrate no significant findings. Lungs/Pleura: Interval resolution of previously seen bilateral airspace opacities and pleural effusions. No new focal airspace consolidation, pleural fluid collection, or pneumothorax. There is chronic scarring within the anterior aspect of the right upper lobe and to a lesser extent the anterior aspect of the left upper lobe. 5 mm pulmonary nodule abutting the lateral pleural surface of the right  lower lobe (series 3, image 94), unchanged compared to 2018 CT. Additional 4 mm juxtapleural pulmonary nodule at the inferior aspect of the left lower lobe (series 3, image 126), also unchanged. No new or suspicious pulmonary nodules. Upper Abdomen: No acute abnormality. Musculoskeletal: No chest wall mass or suspicious bone lesions identified. Multilevel degenerative changes of the thoracic spine with prominent endplate osteophytosis. IMPRESSION: 1. Interval resolution of previously seen multifocal airspace opacities and bilateral pleural effusions. No new focal airspace consolidation. 2. No new or suspicious pulmonary nodule. 3. Stable 5 mm right lower lobe and 4 mm left lower lobe pulmonary nodules compared to previous CT 05/10/2016, favored benign. Electronically Signed   By: Davina Poke M.D.   On: 09/29/2018 16:46   Ct Cervical Spine Wo Contrast  Result Date: 10/07/2018 CLINICAL DATA:  Ataxia, post head trauma; C-spine fx, traumatic. Multiple falls. Weakness. EXAM: CT HEAD WITHOUT CONTRAST CT CERVICAL SPINE WITHOUT CONTRAST TECHNIQUE: Multidetector CT imaging of the head and cervical spine was performed following the standard protocol without intravenous contrast. Multiplanar CT image reconstructions of the cervical spine were also generated. COMPARISON:  Head CT 03/16/2018 FINDINGS: CT HEAD FINDINGS Brain: No evidence of acute infarction, hemorrhage, hydrocephalus, extra-axial collection or mass lesion/mass effect. Age related atrophy. Similar chronic small vessel ischemia. Vascular: Atherosclerosis of skullbase vasculature without hyperdense vessel or abnormal calcification. Skull: No fracture or focal lesion. Sinuses/Orbits: Postsurgical change in the right maxillary sinus. No acute findings. Bilateral cataract resection. Other: None. CT CERVICAL SPINE FINDINGS Alignment: Normal. Skull base and vertebrae: No acute fracture. Vertebral body heights are maintained. The dens and skull base are intact.  Non fusion posterior arch of C1, normal variant. Soft tissues and spinal canal: No prevertebral fluid or swelling. No visible canal hematoma. Disc levels: Disc space narrowing and endplate spurring most prominent at C4-C5. Scattered minor facet hypertrophy. Upper chest: No acute findings, allowing for motion artifact. Other: Carotid calcifications. IMPRESSION: 1. No acute intracranial abnormality. No skull fracture. Stable atrophy and chronic small vessel ischemia. 2. Mild degenerative change in the cervical spine without acute fracture or subluxation. 3. Carotid and skullbase atherosclerosis. Electronically Signed   By: Keith Rake M.D.   On: 10/07/2018 20:47    Subjective: Eager to go home  Discharge Exam: Vitals:   10/11/18 0522 10/11/18 1339  BP: (!) 155/86   Pulse: (!) 101   Resp: 18   Temp: 98.2 F (36.8 C)   SpO2: 92% 92%   Vitals:   10/10/18 1358 10/10/18 2046 10/11/18 0522 10/11/18 1339  BP: (!) 144/62 (!) 149/63 (!) 155/86   Pulse: 93 (!) 103 (!) 101   Resp: 18 16 18    Temp: 98.4 F (36.9 C) 98.4 F (36.9 C) 98.2 F (36.8 C)   TempSrc:  Oral Oral   SpO2: 98% 95% 92% 92%  Weight:      Height:        General: Pt is alert, awake, not in acute distress Cardiovascular: RRR, S1/S2 +, no  rubs, no gallops Respiratory: CTA bilaterally, no wheezing, no rhonchi Abdominal: Soft, NT, ND, bowel sounds + Extremities: no edema, no cyanosis   The results of significant diagnostics from this hospitalization (including imaging, microbiology, ancillary and laboratory) are listed below for reference.     Microbiology: Recent Results (from the past 240 hour(s))  SARS Coronavirus 2 [facility] order, Performed in [facility] hospital lab) Nasopharyngeal Nasopharyngeal Swab     Status: None   Collection Time: 10/07/18  7:04 PM   Specimen: Nasopharyngeal Swab  Result Value Ref Range Status   SARS Coronavirus 2 NEGATIVE NEGATIVE Final    Comment: (NOTE) If result is  NEGATIVE SARS-CoV-2 target nucleic acids are NOT DETECTED. The SARS-CoV-2 RNA is generally detectable in upper and lower  respiratory specimens during the acute phase of infection. The lowest  concentration of SARS-CoV-2 viral copies this assay can detect is 250  copies / mL. A negative result does not preclude SARS-CoV-2 infection  and should not be used as the sole basis for treatment or other  patient management decisions.  A negative result may occur with  improper specimen collection / handling, submission of specimen other  than nasopharyngeal swab, presence of viral mutation(s) within the  areas targeted by this assay, and inadequate number of viral copies  (<250 copies / mL). A negative result must be combined with clinical  observations, patient history, and epidemiological information. If result is POSITIVE SARS-CoV-2 target nucleic acids are DETECTED. The SARS-CoV-2 RNA is generally detectable in upper and lower  respiratory specimens dur ing the acute phase of infection.  Positive  results are indicative of active infection with SARS-CoV-2.  Clinical  correlation with patient history and other diagnostic information is  necessary to determine patient infection status.  Positive results do  not rule out bacterial infection or co-infection with other viruses. If result is PRESUMPTIVE POSTIVE SARS-CoV-2 nucleic acids MAY BE PRESENT.   A presumptive positive result was obtained on the submitted specimen  and confirmed on repeat testing.  While 2019 novel coronavirus  (SARS-CoV-2) nucleic acids may be present in the submitted sample  additional confirmatory testing may be necessary for epidemiological  and / or clinical management purposes  to differentiate between  SARS-CoV-2 and other Sarbecovirus currently known to infect humans.  If clinically indicated additional testing with an alternate test  methodology [phone]) is advised. The SARS-CoV-2 RNA is generally  detectable  in upper and lower respiratory sp ecimens during the acute  phase of infection. The expected result is Negative. Fact Sheet for Patients:  StrictlyIdeas.no Fact Sheet for Healthcare Providers: BankingDealers.co.za This test is not yet approved or cleared by the [place] FDA and has been authorized for detection and/or diagnosis of SARS-CoV-2 by FDA under an Emergency Use Authorization (EUA).  This EUA will remain in effect (meaning this test can be used) for the duration of the COVID-19 declaration under Section 564(b)(1) of the Act, 21 U.S.C. section 360bbb-3(b)(1), unless the authorization is terminated or revoked sooner. Performed at [facility], [place] [address], [place], [place] [address]      Labs: BNP (last 3 results) Recent Labs    03/16/18 0911 04/02/18 1805  BNP 279.6* 654.6*   Basic Metabolic Panel: Recent Labs  Lab 10/07/18 1904 10/08/18 0550 10/09/18 0548 10/10/18 0526 10/10/18 0531 10/11/18 0707  NA 138 139 137  --  141 139  K 3.4* 3.4* 4.2  --  3.9 4.7  CL 104 107 109  --  111 109  CO2 21* 22 20*  --  23 21*  GLUCOSE 68* 207* 256*  --  65* 58*  BUN 35* 30* 26*  --  20 18  CREATININE 2.68* 2.23* 1.64*  --  1.42* 1.32*  CALCIUM 8.6* 8.5* 8.3*  --  8.6* 8.7*  MG  --   --   --  1.5*  --   --    Liver Function Tests: Recent Labs  Lab 10/07/18 1904 10/08/18 0550 10/11/18 0707  AST 21 15 11*  ALT 17 15 12   ALKPHOS 89 90 76  BILITOT 0.1* 0.3 0.3  PROT 7.3 6.6 6.1*  ALBUMIN 3.6 3.3* 2.6*   No results for input(s): LIPASE, AMYLASE in the last 168 hours. Recent Labs  Lab 10/07/18 1904  AMMONIA 17   CBC: Recent Labs  Lab 10/07/18 1904 10/07/18 2358 10/08/18 0550 10/11/18 0707  WBC 14.9* 14.4* 12.2* 11.3*  NEUTROABS 12.3* 11.6* 9.3*  --   HGB 11.1* 11.2* 10.6* 10.8*  HCT 34.4* 34.3* 33.0* 34.4*  MCV 85.1 84.9 84.8 87.5  PLT 298 310 285 260   Cardiac Enzymes: No  results for input(s): CKTOTAL, CKMB, CKMBINDEX, TROPONINI in the last 168 hours. BNP: Invalid input(s): POCBNP CBG: Recent Labs  Lab 10/09/18 2047 10/10/18 0132 10/11/18 0748 10/11/18 0830 10/11/18 1142  GLUCAP 85 68* 53* 70 79   D-Dimer No results for input(s): DDIMER in the last 72 hours. Hgb A1c No results for input(s): HGBA1C in the last 72 hours. Lipid Profile No results for input(s): CHOL, HDL, LDLCALC, TRIG, CHOLHDL, LDLDIRECT in the last 72 hours. Thyroid function studies No results for input(s): TSH, T4TOTAL, T3FREE, THYROIDAB in the last 72 hours.  Invalid input(s): FREET3 Anemia work up No results for input(s): VITAMINB12, FOLATE, FERRITIN, TIBC, IRON, RETICCTPCT in the last 72 hours. Urinalysis    Component Value Date/Time   COLORURINE YELLOW 10/07/2018 1904   APPEARANCEUR CLEAR 10/07/2018 1904   LABSPEC 1.018 10/07/2018 1904   PHURINE 5.0 10/07/2018 1904   GLUCOSEU 50 (A) 10/07/2018 1904   GLUCOSEU 250 (A) 08/17/2017 1411   HGBUR NEGATIVE 10/07/2018 1904   BILIRUBINUR NEGATIVE 10/07/2018 1904   KETONESUR NEGATIVE 10/07/2018 1904   PROTEINUR NEGATIVE 10/07/2018 1904   UROBILINOGEN 0.2 08/17/2017 1411   NITRITE NEGATIVE 10/07/2018 1904   LEUKOCYTESUR NEGATIVE 10/07/2018 1904   Sepsis Labs Invalid input(s): PROCALCITONIN,  WBC,  LACTICIDVEN Microbiology Recent Results (from the past 240 hour(s))  SARS Coronavirus 2 [facility] order, Performed in [facility] hospital lab) Nasopharyngeal Nasopharyngeal Swab     Status: None   Collection Time: 10/07/18  7:04 PM   Specimen: Nasopharyngeal Swab  Result Value Ref Range Status   SARS Coronavirus 2 NEGATIVE NEGATIVE Final    Comment: (NOTE) If result is NEGATIVE SARS-CoV-2 target nucleic acids are NOT DETECTED. The SARS-CoV-2 RNA is generally detectable in upper and lower  respiratory specimens during the acute phase of infection. The lowest  concentration of SARS-CoV-2 viral copies this assay can detect is  250  copies / mL. A negative result does not preclude SARS-CoV-2 infection  and should not be used as the sole basis for treatment or other  patient management decisions.  A negative result may occur with  improper specimen collection / handling, submission of specimen other  than nasopharyngeal swab, presence of viral mutation(s) within the  areas targeted by this assay, and inadequate number of viral copies  (<250 copies / mL). A negative result must be combined with clinical  observations, patient history, and epidemiological information. If result is POSITIVE SARS-CoV-2 target nucleic acids are DETECTED. The SARS-CoV-2 RNA is generally detectable in upper and lower  respiratory specimens dur ing the acute phase of infection.  Positive  results are indicative of active infection with SARS-CoV-2.  Clinical  correlation with patient history and other diagnostic information is  necessary to determine patient infection status.  Positive results do  not rule out bacterial infection or co-infection with other viruses. If result is PRESUMPTIVE POSTIVE SARS-CoV-2 nucleic acids MAY BE PRESENT.   A presumptive positive result was obtained on the submitted specimen  and confirmed on repeat testing.  While 2019 novel coronavirus  (SARS-CoV-2) nucleic acids may be present in the submitted sample  additional confirmatory testing may be necessary for epidemiological  and / or clinical management purposes  to differentiate between  SARS-CoV-2 and other Sarbecovirus currently known to infect humans.  If clinically indicated additional testing with an alternate test  methodology [phone]) is advised. The SARS-CoV-2 RNA is generally  detectable in upper and lower respiratory sp ecimens during the acute  phase of infection. The expected result is Negative. Fact Sheet for Patients:  StrictlyIdeas.no Fact Sheet for Healthcare  Providers: BankingDealers.co.za This test is not yet approved or cleared by the [place] FDA and has been authorized for detection and/or diagnosis of SARS-CoV-2 by FDA under an Emergency Use Authorization (EUA).  This EUA will remain in effect (meaning this test can be used) for the duration of the COVID-19 declaration under Section 564(b)(1) of the Act, 21 U.S.C. section 360bbb-3(b)(1), unless the authorization is terminated or revoked sooner. Performed at [facility], [place] [address], [place],  [address]    Time spent: 30 min  SIGNED:   Marylu Lund, MD  [name] 10/11/2018, 3:26 PM  If 7PM-7AM, please contact night-coverage

## 2018-10-11 NOTE — Progress Notes (Signed)
Patient is on room air, spO2= 92%

## 2018-10-11 NOTE — Progress Notes (Signed)
Physical Therapy Treatment Patient Details Name: April Branch MRN: [id number] DOB: Feb 09, 1946 Today's Date: 10/11/2018    History of Present Illness 73 y.o. female with history of chronic kidney disease stage III, hypertension, diabetes mellitus type 2 on insulin pump, chronic anemia, chronic pain and interstitial lung disease and pulmonary nodule, pt admitted d/t multiple falls.  Patient states she tripped and fell while walking. xray= minimally displaced R fibula fx, ortho consulted    PT Comments    Pt felt poorly during session today and vomited.  She states April Branch can assist her with mobility at home and is adamantly against short term SNF rehab.  She will need ambulance transport home to get her up her 4 steps to enter, unless she can have family members pick her w/c up and get her into the home.  She was educated on safety of w/c transfer into the home and will be given illustrated handout.  PT reviewed WB status with her and need to maintain NWB until MD says otherwise. She can be impulsive and moves quickly. Reviewed importance of slowing down for safe transfers to decrease fall risk.  Recommend HHPT and an aide if possible.  Follow Up Recommendations  Home health PT;Supervision for mobility/OOB;Other (comment)(Pt refusing SNF)     Equipment Recommendations  Rolling walker with 5" wheels    Recommendations for Other Services       Precautions / Restrictions Precautions Precautions: Fall Required Braces or Orthoses: Splint/Cast Splint/Cast: RLE splint Restrictions Weight Bearing Restrictions: Yes RLE Weight Bearing: Non weight bearing    Mobility  Bed Mobility Overal bed mobility: Needs Assistance Bed Mobility: Supine to Sit     Supine to sit: Supervision     General bed mobility comments: Pt able to do with S with a good bit of difficulty, but didn't want any assist.  Transfers Overall transfer level: Needs assistance Equipment used: Rolling walker (2  wheeled) Transfers: Sit to/from [facility] Sit to Stand: Min assist Stand pivot transfers: Mod assist       General transfer comment: Pt stood to RW and had to sit abruptly.  Pt stated she needed to void quickly, so did SPT to [facility] without RW and MOD A.  Pt did demonstrate poor safety awareness when trying to perform hygiene on her own.  Pt then assisted her by pt standing at [facility] and PT performign hygiene.  She again had to sit abruptly due to nausea and vomited.  Transferred to recliner with MIN/MOD A and cues for NWB on R LE.  Ambulation/Gait                 Stairs             Wheelchair Mobility    Modified Rankin (Stroke Patients Only)       Balance                                            Cognition Arousal/Alertness: Awake/alert Behavior During Therapy: WFL for tasks assessed/performed Overall Cognitive Status: Within Functional Limits for tasks assessed                                        Exercises      General Comments General comments (skin integrity, edema, etc.):  Lengthy discussion with pt on how she will manage at home and importance of safety and maintaining NWB status.       Pertinent Vitals/Pain Pain Assessment: 0-10 Pain Score: 10-Worst pain ever Pain Location: right LE Pain Descriptors / Indicators: Aching Pain Intervention(s): Limited activity within patient's tolerance;Monitored during session;Repositioned;Patient requesting pain meds-RN notified    Home Living                      Prior Function            PT Goals (current goals can now be found in the care plan section) Progress towards PT goals: Progressing toward goals    Frequency    Min 3X/week      PT Plan Current plan remains appropriate    Co-evaluation              AM-PAC PT "6 Clicks" Mobility   Outcome Measure  Help needed turning from your back to your side while in a flat bed without  using bedrails?: None Help needed moving from lying on your back to sitting on the side of a flat bed without using bedrails?: None Help needed moving to and from a bed to a chair (including a wheelchair)?: A Lot Help needed standing up from a chair using your arms (e.g., wheelchair or bedside chair)?: A Little Help needed to walk in hospital room?: A Lot Help needed climbing 3-5 steps with a railing? : Total 6 Click Score: 16    End of Session Equipment Utilized During Treatment: Gait belt;Oxygen Activity Tolerance: Treatment limited secondary to medical complications (Comment)(vomiting) Patient left: in chair;with call bell/phone within reach;with chair alarm set;with nursing/sitter in room Nurse Communication: Mobility status PT Visit Diagnosis: Difficulty in walking, not elsewhere classified (R26.2);History of falling (Z91.81)     Time: 0998-3382 PT Time Calculation (min) (ACUTE ONLY): 45 min  Charges:  $Therapeutic Activity: 38-52 mins                     April Branch, [place] Pager [phone] 10/11/2018    April Branch 10/11/2018, 10:20 AM

## 2018-10-11 NOTE — Progress Notes (Signed)
Patient has discharged to home on 10/11/2018. CM is notified for [facility]. Discharge instruction including medication and appointment was given to patient. Patient does not have any question at this time.

## 2018-10-11 NOTE — Progress Notes (Signed)
Inpatient Diabetes Program Recommendations  AACE/ADA: New Consensus Statement on Inpatient Glycemic Control (2015)  Target Ranges:  Prepandial:   less than 140 mg/dL      Peak postprandial:   less than 180 mg/dL (1-2 hours)      Critically ill patients:  140 - 180 mg/dL   Lab Results  Component Value Date   GLUCAP 79 10/11/2018   HGBA1C 9.9 (A) 07/21/2018    Review of Glycemic Control  Had hypoglycemia this am - 53, 58 mg/dL.   Inpatient Diabetes Program Recommendations:     When pt is discharged, will need to call Dr. Ronnie Derby office regarding hypoglycemia while in the hospital. May need to have adjustments made to basal/bolus rates.  Pt has appt with Dr. [name] next week.   Continue to follow.  Thank you. Lorenda Peck, RD, LDN, CDE Inpatient Diabetes Coordinator [phone]

## 2018-10-11 NOTE — TOC Transition Note (Addendum)
Transition of Care [facility]) - CM/SW Discharge Note   Patient Details  Name: April Branch MRN: [id number] Date of Birth: November 02, 1945  [facility] CM/SW Contact:  Nila Nephew, LCSW Phone Number: [phone] 10/11/2018, 3:51 PM   Clinical Narrative:   Pt admitted after a fall with fibula fracture.  Has chronic kidney disease and DM type 2 (on insulin), lives at home with family.  Planning to return home at DC today, recommended to continue therapy post DC and pt agreeable to HHPT- reports she had [place] in the past through [facility] and would like agency again if available, CSW referred [facility] accepted  Patient Goals and CMS Choice    Go home  Home Health    Discharge Placement                       Discharge Plan and [place] Determinants of Health (SDOH) Interventions     Readmission Risk Interventions No flowsheet data found.

## 2018-10-12 ENCOUNTER — Telehealth: Payer: Self-pay | Admitting: Cardiovascular Disease

## 2018-10-12 NOTE — Telephone Encounter (Signed)
° ° °  Appointment scheduled 8/31 virtual - Kathlen Mody

## 2018-10-12 NOTE — Telephone Encounter (Signed)
-----   Message from Darreld Mclean, [place] sent at 10/11/2018  4:03 PM EDT ----- Regarding: [place] Patient was admitted with acute encephalopathy and developed new onset atrial flutter. Cardiology was not consulted during this admission and Hospitalist Service managed this; however, patient is a patient of Dr. Johnsie Cancel. Patient being discharged today.  Dr. Wyline Copas (Hospitalist attending) requested follow-up visit with Dr. Johnsie Cancel or one of his APPs in 1-2 weeks. Can you please call the patient to help set this up?  Thanks so much! Callie

## 2018-10-16 NOTE — Progress Notes (Deleted)
@Patient  ID: April Branch, female    DOB: [birth year], 73 y.o.   MRN: [id number]  No chief complaint on file.   Referring provider: [name], MD  HPI:  73 year old female current every day smoker followed in our office by Dr. Elsworth Soho for interstitial lung disease  PMH: GERD, IBS, anxiety, hypertension, chronic pain syndrome, CKD stage III Smoker/ Smoking History: Current Everyday Smoker. 5 cigarettes a day.  75 pack year smoker.  Maintenance:  None  Pt of: Dr. Elsworth Soho   10/16/2018  - Visit   HPI  Tests:   2/10/20Perfusion scan was negative for PE. 2/9/20High-resolution CT chest multilobar pneumonia rather than ILD 03/2018 ANA , RA , ANCA neg  04/03/2018-CT chest high resolution-appearance of chest is favored reflect multilobar bronchopneumonia than underlying ILD, if there is persistent clinical concern for ILD repeat high-resolution CT scan in 6 to 12 months  05/12/2018-pulmonary function test - FVC 1.67 (60% predicted), postbronchodilator ratio 81, postbronchodilator FEV1 1.34 (64% predicted), no bronchodilator response, DLCO 69 >>>Intraparenchymal restriction consistent with interstitial lung disease  03/17/2018-echocardiogram-LV ejection fraction 60 to 65%   08/15/2018-chest x-ray- persistent 9 x 8 mm nodular opacity right upper lobe, note that on prior CT this area was obscured by overlying pneumonia, this nodular opacity warrants additional surveillance advised noncontrast enhanced chest CT to further assess, bilateral upper lobe scarring   FENO:  No results found for: NITRICOXIDE  PFT: PFT Results Latest Ref Rng & Units 05/12/2018  FVC-Pre L 1.67  FVC-Predicted Pre % 60  FVC-Post L 1.65  FVC-Predicted Post % 59  Pre FEV1/FVC % % 82  Post FEV1/FCV % % 81  FEV1-Pre L 1.37  FEV1-Predicted Pre % 65  FEV1-Post L 1.34  DLCO UNC% % 69  DLCO COR %Predicted % 126  TLC L 3.53  TLC % Predicted % 74  RV % Predicted % 84    Imaging: Dg Chest 2 View  Result Date:  10/07/2018 CLINICAL DATA:  Weakness EXAM: CHEST - 2 VIEW COMPARISON:  08/15/2018, CT 09/29/2018 FINDINGS: Cardiomegaly with vascular congestion. No pleural effusion or pneumothorax. Aortic atherosclerosis. Vague nodular opacity in the right mid to upper lung is noted, no correlate on CT chest follow-up 09/29/2018. IMPRESSION: Cardiomegaly with vascular congestion Electronically Signed   By: Donavan Foil M.D.   On: 10/07/2018 19:48   Dg Ankle Complete Right  Result Date: 10/07/2018 CLINICAL DATA:  Weakness, fall EXAM: RIGHT ANKLE - COMPLETE 3+ VIEW COMPARISON:  None. FINDINGS: Acute oblique fracture distal shaft of the fibula above the ankle joint. Minimal less than 1/4 bone with lateral displacement distal fracture fragment. Ankle mortise is symmetric. Plantar calcaneal spur. IMPRESSION: Sure acute minimally displaced distal fibular fracture Electronically Signed   By: Donavan Foil M.D.   On: 10/07/2018 19:49   Ct Head Wo Contrast  Result Date: 10/07/2018 CLINICAL DATA:  Ataxia, post head trauma; C-spine fx, traumatic. Multiple falls. Weakness. EXAM: CT HEAD WITHOUT CONTRAST CT CERVICAL SPINE WITHOUT CONTRAST TECHNIQUE: Multidetector CT imaging of the head and cervical spine was performed following the standard protocol without intravenous contrast. Multiplanar CT image reconstructions of the cervical spine were also generated. COMPARISON:  Head CT 03/16/2018 FINDINGS: CT HEAD FINDINGS Brain: No evidence of acute infarction, hemorrhage, hydrocephalus, extra-axial collection or mass lesion/mass effect. Age related atrophy. Similar chronic small vessel ischemia. Vascular: Atherosclerosis of skullbase vasculature without hyperdense vessel or abnormal calcification. Skull: No fracture or focal lesion. Sinuses/Orbits: Postsurgical change in the right maxillary sinus. No acute findings. Bilateral  cataract resection. Other: None. CT CERVICAL SPINE FINDINGS Alignment: Normal. Skull base and vertebrae: No acute  fracture. Vertebral body heights are maintained. The dens and skull base are intact. Non fusion posterior arch of C1, normal variant. Soft tissues and spinal canal: No prevertebral fluid or swelling. No visible canal hematoma. Disc levels: Disc space narrowing and endplate spurring most prominent at C4-C5. Scattered minor facet hypertrophy. Upper chest: No acute findings, allowing for motion artifact. Other: Carotid calcifications. IMPRESSION: 1. No acute intracranial abnormality. No skull fracture. Stable atrophy and chronic small vessel ischemia. 2. Mild degenerative change in the cervical spine without acute fracture or subluxation. 3. Carotid and skullbase atherosclerosis. Electronically Signed   By: Keith Rake M.D.   On: 10/07/2018 20:47   Ct Chest Wo Contrast  Result Date: 09/29/2018 CLINICAL DATA:  Follow-up pulmonary nodule EXAM: CT CHEST WITHOUT CONTRAST TECHNIQUE: Multidetector CT imaging of the chest was performed following the standard protocol without IV contrast. COMPARISON:  CT 04/03/2018, 05/10/2016 FINDINGS: Cardiovascular: Heart size is mildly enlarged, unchanged. No pericardial effusion. Thoracic aorta nonaneurysmal. There are atherosclerotic calcifications of the aorta and coronary arteries. Mediastinum/Nodes: No enlarged mediastinal or axillary lymph nodes. Thyroid gland, trachea, and esophagus demonstrate no significant findings. Lungs/Pleura: Interval resolution of previously seen bilateral airspace opacities and pleural effusions. No new focal airspace consolidation, pleural fluid collection, or pneumothorax. There is chronic scarring within the anterior aspect of the right upper lobe and to a lesser extent the anterior aspect of the left upper lobe. 5 mm pulmonary nodule abutting the lateral pleural surface of the right lower lobe (series 3, image 94), unchanged compared to 2018 CT. Additional 4 mm juxtapleural pulmonary nodule at the inferior aspect of the left lower lobe (series  3, image 126), also unchanged. No new or suspicious pulmonary nodules. Upper Abdomen: No acute abnormality. Musculoskeletal: No chest wall mass or suspicious bone lesions identified. Multilevel degenerative changes of the thoracic spine with prominent endplate osteophytosis. IMPRESSION: 1. Interval resolution of previously seen multifocal airspace opacities and bilateral pleural effusions. No new focal airspace consolidation. 2. No new or suspicious pulmonary nodule. 3. Stable 5 mm right lower lobe and 4 mm left lower lobe pulmonary nodules compared to previous CT 05/10/2016, favored benign. Electronically Signed   By: Davina Poke M.D.   On: 09/29/2018 16:46   Ct Cervical Spine Wo Contrast  Result Date: 10/07/2018 CLINICAL DATA:  Ataxia, post head trauma; C-spine fx, traumatic. Multiple falls. Weakness. EXAM: CT HEAD WITHOUT CONTRAST CT CERVICAL SPINE WITHOUT CONTRAST TECHNIQUE: Multidetector CT imaging of the head and cervical spine was performed following the standard protocol without intravenous contrast. Multiplanar CT image reconstructions of the cervical spine were also generated. COMPARISON:  Head CT 03/16/2018 FINDINGS: CT HEAD FINDINGS Brain: No evidence of acute infarction, hemorrhage, hydrocephalus, extra-axial collection or mass lesion/mass effect. Age related atrophy. Similar chronic small vessel ischemia. Vascular: Atherosclerosis of skullbase vasculature without hyperdense vessel or abnormal calcification. Skull: No fracture or focal lesion. Sinuses/Orbits: Postsurgical change in the right maxillary sinus. No acute findings. Bilateral cataract resection. Other: None. CT CERVICAL SPINE FINDINGS Alignment: Normal. Skull base and vertebrae: No acute fracture. Vertebral body heights are maintained. The dens and skull base are intact. Non fusion posterior arch of C1, normal variant. Soft tissues and spinal canal: No prevertebral fluid or swelling. No visible canal hematoma. Disc levels: Disc space  narrowing and endplate spurring most prominent at C4-C5. Scattered minor facet hypertrophy. Upper chest: No acute findings, allowing for motion artifact. Other:  Carotid calcifications. IMPRESSION: 1. No acute intracranial abnormality. No skull fracture. Stable atrophy and chronic small vessel ischemia. 2. Mild degenerative change in the cervical spine without acute fracture or subluxation. 3. Carotid and skullbase atherosclerosis. Electronically Signed   By: Keith Rake M.D.   On: 10/07/2018 20:47      Specialty Problems      Pulmonary Problems   COPD (chronic obstructive pulmonary disease) (HCC)   Respiratory failure with hypoxia (HCC)   Acute pulmonary edema (HCC)   ILD (interstitial lung disease) ([place])    Waxing and waning bilateral pulmonary infiltrates -seem to be steroid responsive rather than diuresis.   Differential diagnosis includes RB ILD, [place], chronic aspiration given her prior episodes of food impaction      Bronchitis   Pulmonary nodule    08/15/2018-chest x-ray- persistent 9 x 8 mm nodular opacity right upper lobe, note that on prior CT this area was obscured by overlying pneumonia, this nodular opacity warrants additional surveillance advised noncontrast enhanced chest CT to further assess, bilateral upper lobe scarring         No Known Allergies  Immunization History  Administered Date(s) Administered   Influenza, High Dose Seasonal PF 11/12/2017, 11/14/2017   Influenza-Unspecified 11/14/2014   Pneumococcal Conjugate-13 11/14/2017   Td 04/23/2005   Tdap 06/14/2015    Past Medical History:  Diagnosis Date   Anemia    Anxiety    Arthritis    CAD (coronary artery disease) 2016   non-obstructive at cath   Colon polyps 09/11/2010   Tubular adenoma   COPD 12/14/2008   PATIENT DENIES     Depression    Diabetic coma ([place]) Feb. 2014   Fall at home 2016   x 2 in January 2016, Left knee   Fibromyalgia    GERD 07/20/2006   Headache(784.0)     History of transient ischemic attack (TIA)    HPV (human papilloma virus) infection    Hyperlipidemia    Hypertension    Hypothyroidism    Insulin dependent type 2 diabetes mellitus, controlled ([place]) 1989   Lumbar disc disease    Neuromuscular disorder ([place])    PERIPHERAL NEUROPATHY   Peripheral vascular disease ([place])    Personal history of malignant neoplasm of kidney(V10.52) 12/14/2008   Laparoscopic biopsy and cryoablation 7/08 Dr. Vernie Shanks    Renal disorder    chronic kidney disease    Stroke ([place])    3 MINI STROKES  RT SIDED WEAKNESS   Vertigo     Tobacco History: Social History   Tobacco Use  Smoking Status Current Every Day Smoker   Packs/day: 1.50   Years: 50.00   Pack years: 75.00   Types: Cigarettes   Last attempt to quit: 06/24/2014   Years since quitting: 4.3  Smokeless Tobacco Never Used  Tobacco Comment   08/15/2018 5 cigs a day   Ready to quit: Not Answered Counseling given: Not Answered Comment: 08/15/2018 5 cigs a day   Continue to not smoke  Outpatient Encounter Medications as of 10/17/2018  Medication Sig   ACCU-CHEK FASTCLIX LANCETS MISC Use 4 per day to check blood sugar dx code E11.9   albuterol (PROVENTIL HFA;VENTOLIN HFA) 108 (90 Base) MCG/ACT inhaler Inhale 1-2 puffs into the lungs every 6 (six) hours as needed for wheezing or shortness of breath. (Patient not taking: Reported on 08/15/2018)   amLODipine (NORVASC) 10 MG tablet Take 10 mg by mouth daily.   aspirin EC 81 MG tablet Take 1 tablet (81  mg total) by mouth daily.   cilostazol (PLETAL) 100 MG tablet TAKE 1 TABLET BY MOUTH TWICE A DAY (Patient taking differently: Take 100 mg by mouth 2 (two) times daily. )   DULoxetine (CYMBALTA) 60 MG capsule TAKE 1 CAPSULE BY MOUTH ONCE DAILY (Patient not taking: Reported on 10/07/2018)   ferrous sulfate 325 (65 FE) MG tablet Take 325 mg by mouth 2 (two) times daily.    furosemide (LASIX) 20 MG tablet Take 1 tablet (20 mg  total) by mouth daily. (Patient not taking: Reported on 10/07/2018)   glucose blood (CONTOUR NEXT TEST) test strip Use to test blood sugar 3 times daily E11.9   HYDROcodone-acetaminophen (NORCO) 10-325 MG tablet Take 1 tablet by mouth 3 (three) times daily as needed (for pain). Follow up with PCP if refills needed (Patient not taking: Reported on 10/07/2018)   insulin lispro (HUMALOG) 100 UNIT/ML injection Inject 0.7 mLs (70 Units total) into the skin See admin instructions. Use 200 units every 3 days in insulin pump   Insulin Pen Needle (BD PEN NEEDLE NANO U/F) 32G X 4 MM MISC Use to inject insulin   ipratropium-albuterol (DUONEB) 0.5-2.5 (3) MG/3ML SOLN Take 3 mLs by nebulization every 6 (six) hours as needed.   lansoprazole (PREVACID) 30 MG capsule Take 1 capsule (30 mg total) by mouth daily.   levothyroxine (SYNTHROID, LEVOTHROID) 150 MCG tablet TAKE 1 TABLET BY MOUTH ONCE DAILY (Patient taking differently: Take 150 mcg by mouth daily. )   methylPREDNISolone (MEDROL DOSEPAK) 4 MG TBPK tablet Take 4 mg by mouth See admin instructions.   metoprolol succinate (TOPROL-XL) 50 MG 24 hr tablet Take 1 tablet (50 mg total) by mouth daily. Take with or immediately following a meal.   OXYGEN Inhale 1 L into the lungs as needed (shortness of breath).   Rivaroxaban (XARELTO) 15 MG TABS tablet Take 1 tablet (15 mg total) by mouth daily with supper.   rosuvastatin (CRESTOR) 20 MG tablet Take 20 mg by mouth daily.   tolterodine (DETROL LA) 4 MG 24 hr capsule Take 4 mg by mouth daily.    triamcinolone cream (KENALOG) 0.1 % Apply 1 application topically 2 (two) times daily. (Patient not taking: Reported on 10/07/2018)   No facility-administered encounter medications on file as of 10/17/2018.      Review of Systems  Review of Systems   Physical Exam  There were no vitals taken for this visit.  Wt Readings from Last 5 Encounters:  10/10/18 169 lb 11.2 oz (77 kg)  09/05/18 163 lb 12.8 oz (74.3  kg)  08/15/18 165 lb (74.8 kg)  07/21/18 170 lb 12.8 oz (77.5 kg)  05/05/18 164 lb 9.6 oz (74.7 kg)     Physical Exam   Lab Results:  CBC    Component Value Date/Time   WBC 11.3 (H) 10/11/2018 0707   RBC 3.93 10/11/2018 0707   HGB 10.8 (L) 10/11/2018 0707   HGB 11.9 04/20/2017 1047   HCT 34.4 (L) 10/11/2018 0707   HCT 34.9 04/20/2017 1047   PLT 260 10/11/2018 0707   PLT 350 04/20/2017 1047   MCV 87.5 10/11/2018 0707   MCV 89 04/20/2017 1047   MCH 27.5 10/11/2018 0707   MCHC 31.4 10/11/2018 0707   RDW 14.9 10/11/2018 0707   RDW 14.7 04/20/2017 1047   LYMPHSABS 1.9 10/08/2018 0550   LYMPHSABS 1.9 04/20/2017 1047   MONOABS 0.8 10/08/2018 0550   EOSABS 0.1 10/08/2018 0550   EOSABS 0.1 04/20/2017 1047  BASOSABS 0.1 10/08/2018 0550   BASOSABS 0.1 04/20/2017 1047    BMET    Component Value Date/Time   NA 139 10/11/2018 0707   NA 138 04/20/2017 1047   K 4.7 10/11/2018 0707   CL 109 10/11/2018 0707   CO2 21 (L) 10/11/2018 0707   GLUCOSE 58 (L) 10/11/2018 0707   BUN 18 10/11/2018 0707   BUN 17 04/20/2017 1047   CREATININE 1.32 (H) 10/11/2018 0707   CALCIUM 8.7 (L) 10/11/2018 0707   GFRNONAA 40 (L) 10/11/2018 0707   GFRAA 47 (L) 10/11/2018 0707    BNP    Component Value Date/Time   BNP 331.9 (H) 04/02/2018 1805    ProBNP    Component Value Date/Time   PROBNP 330.8 (H) 09/21/2012 1330      Assessment & Plan:   No problem-specific Assessment & Plan notes found for this encounter.    No follow-ups on file.   Lauraine Rinne, NP 10/16/2018   This appointment was *** minutes long with over 50% of the time in direct face-to-face patient care, assessment, plan of care, and follow-up.

## 2018-10-17 ENCOUNTER — Ambulatory Visit: Payer: Medicare Other | Admitting: Pulmonary Disease

## 2018-10-18 NOTE — Progress Notes (Signed)
Virtual Visit via Telephone Note  I connected with April Branch on 10/19/18 at 10:30 AM EDT by telephone and verified that I am speaking with the correct person using two identifiers.  Location: Patient: Home Provider: [facility] - [id number] [place], [place], [place] [address]   I discussed the limitations, risks, security and privacy concerns of performing an evaluation and management service by telephone and the availability of in person appointments. I also discussed with the patient that there may be a patient responsible charge related to this service. The patient expressed understanding and agreed to proceed.  Patient consented to consult via telephone: Yes People present and their role in pt care: Pt   History of Present Illness: 73 year old female current every day smoker followed in our office by Dr. Elsworth Soho for interstitial lung disease  PMH: GERD, IBS, anxiety, hypertension, chronic pain syndrome, CKD stage III Smoker/ Smoking History: Current Everyday Smoker. 5 - 6 cigarettes a day.  75 pack year smoker.  Maintenance:  None  Pt of: Dr. Elsworth Soho   Chief complaint: Hospital Follow Up - CT follow up   73 year old female current every day smoker (smoking 5 to 6 cigarettes a day) followed in our office for abnormal CT in February/2020.  February/2020 CT chest high-res favored to reflect multilobular bronchopneumonia.  Blood work for connective tissue work-up in February/2020 was also negative.  Patient had repeat CT chest in August/2020 results are listed below:  09/29/2018-CT chest without contrast- interval resolution resolution of previously seen multifocal airspace opacities and bilateral pleural effusions, no new focal airspace consolidation, no new or suspicious pulmonary nodules, stable 5 mm right lower lobe and 4 mm left lower lobe pulmonary nodules compared to previous CT on 05/10/2016 and favored to be benign  Patient reports that unfortunately she was  recently hospitalized in August/2020 after a fall.  She reports she is been doing better since getting home.  She is coordinating home physical therapy.  She reports that her breathing has returned back to her baseline.  She is followed up with primary care.  As of right now I do not have an idea of what potentially may have caused the fall.  Leading suspicion from the patient is that she tripped and fell.  Patient also reports that there was a time in her past where she had a similar event where a suspected she may have had a seizure.  She is working with primary care regarding this.  Patient is due to receive her Pneumovax 23 in September/2020 she is also due to receive her flu vaccine  Smoking assessment and cessation counseling  Patient currently smoking: 5-6 cigarettes a day I have advised the patient to quit/stop smoking as soon as possible due to high risk for multiple medical problems.  It will also be very difficult for [place] to manage patient's  respiratory symptoms and status if we continue to expose her lungs to a known irritant.  We do not advise e-cigarettes as a form of stopping smoking.  Patient is not willing to quit smoking.  I have advised the patient that we can assist and have options of nicotine replacement therapy, provided smoking cessation education today, provided smoking cessation counseling, and provided cessation resources.  Follow-up next office visit office visit for assessment of smoking cessation.  Smoking cessation counseling advised for: 4 min   Observations/Objective:  2/10/20Perfusion scan was negative for PE.  03/2018 ANA , RA , ANCA neg  04/03/2018-CT chest high  resolution-appearance of chest is favored reflect multilobar bronchopneumonia than underlying ILD, if there is persistent clinical concern for ILD repeat high-resolution CT scan in 6 to 12 months  05/12/2018-pulmonary function test - FVC 1.67 (60% predicted), postbronchodilator ratio 81,  postbronchodilator FEV1 1.34 (64% predicted), no bronchodilator response, DLCO 69 >>>Intraparenchymal restriction consistent with interstitial lung disease  03/17/2018-echocardiogram-LV ejection fraction 60 to 65%   08/15/2018-chest x-ray- persistent 9 x 8 mm nodular opacity right upper lobe, note that on prior CT this area was obscured by overlying pneumonia, this nodular opacity warrants additional surveillance advised noncontrast enhanced chest CT to further assess, bilateral upper lobe scarring  09/29/2018-CT chest without contrast- interval resolution resolution of previously seen multifocal airspace opacities and bilateral pleural effusions, no new focal airspace consolidation, no new or suspicious pulmonary nodules, stable 5 mm right lower lobe and 4 mm left lower lobe pulmonary nodules compared to previous CT on 05/10/2016 and favored to be benign   Assessment and Plan:  DISORDER, TOBACCO USE Plan: We recommend that he stop smoking Patient needs pneumonia vaccine at next office visit Patient needs flu vaccine at next office visit We will follow-up with lung cancer screening program for patient to have future follow-up after her August/2020 CT chest  Abnormal findings on diagnostic imaging of lung Reviewed CT with patient today over the phone Patient continues to smoke Active referral for lung cancer screening program  Plan: Continue forward with CT follow-up in lung cancer screening program in 2021 to follow stable pulmonary nodules as patient is a current smoker with over 75-pack-year smoking history  Pulmonary nodule Plan: Continue forward with referral with lung cancer screening program as she is an active smoker and has CT from 09/29/2018 that shows stable 5 mm right lower lobe and 4 mm left lower lobe pulmonary nodules, favored to be benign  Respiratory failure with hypoxia ([place]) Plan: Continue 1 to 2 L of O2 at nighttime as prescribed  COPD (chronic obstructive pulmonary  disease) ([place]) Plan: We will continue to monitor patient clinically Patient needs flu vaccine as well as Pneumovax 23 at next office visit If patient starts to have worsening shortness of breath can consider another trial of inhaler therapy Emphasized the need for the patient to stop smoking  ILD (interstitial lung disease) ([place]) Plan: We will continue to monitor the patient clinically If patient has worsened or acute symptoms she will need to have a chest x-ray may need bronchoscopy if patient's infiltrates are present   Follow Up Instructions:  Return in about 6 weeks (around 11/30/2018), or if symptoms worsen or fail to improve, for Follow up with Dr. Elsworth Soho.   I discussed the assessment and treatment plan with the patient. The patient was provided an opportunity to ask questions and all were answered. The patient agreed with the plan and demonstrated an understanding of the instructions.   The patient was advised to call back or seek an in-person evaluation if the symptoms worsen or if the condition fails to improve as anticipated.  I provided 23 minutes of non-face-to-face time during this encounter.   Lauraine Rinne, NP

## 2018-10-19 ENCOUNTER — Encounter: Payer: Self-pay | Admitting: Pulmonary Disease

## 2018-10-19 ENCOUNTER — Ambulatory Visit (INDEPENDENT_AMBULATORY_CARE_PROVIDER_SITE_OTHER): Payer: Medicare Other | Admitting: Pulmonary Disease

## 2018-10-19 ENCOUNTER — Other Ambulatory Visit: Payer: Self-pay

## 2018-10-19 DIAGNOSIS — R918 Other nonspecific abnormal finding of lung field: Secondary | ICD-10-CM | POA: Diagnosis not present

## 2018-10-19 DIAGNOSIS — J849 Interstitial pulmonary disease, unspecified: Secondary | ICD-10-CM

## 2018-10-19 DIAGNOSIS — J9611 Chronic respiratory failure with hypoxia: Secondary | ICD-10-CM

## 2018-10-19 DIAGNOSIS — F17210 Nicotine dependence, cigarettes, uncomplicated: Secondary | ICD-10-CM

## 2018-10-19 DIAGNOSIS — J449 Chronic obstructive pulmonary disease, unspecified: Secondary | ICD-10-CM

## 2018-10-19 DIAGNOSIS — R911 Solitary pulmonary nodule: Secondary | ICD-10-CM

## 2018-10-19 DIAGNOSIS — F17200 Nicotine dependence, unspecified, uncomplicated: Secondary | ICD-10-CM

## 2018-10-19 NOTE — Assessment & Plan Note (Signed)
Plan: We will continue to monitor the patient clinically If patient has worsened or acute symptoms she will need to have a chest x-ray may need bronchoscopy if patient's infiltrates are present

## 2018-10-19 NOTE — Assessment & Plan Note (Signed)
Plan: We will continue to monitor patient clinically Patient needs flu vaccine as well as Pneumovax 23 at next office visit If patient starts to have worsening shortness of breath can consider another trial of inhaler therapy Emphasized the need for the patient to stop smoking

## 2018-10-19 NOTE — Assessment & Plan Note (Signed)
Plan: We recommend that he stop smoking Patient needs pneumonia vaccine at next office visit Patient needs flu vaccine at next office visit We will follow-up with lung cancer screening program for patient to have future follow-up after her August/2020 CT chest

## 2018-10-19 NOTE — Assessment & Plan Note (Signed)
Plan: Continue 1 to 2 L of O2 at nighttime as prescribed 

## 2018-10-19 NOTE — Patient Instructions (Addendum)
You were seen today by Lauraine Rinne, NP  for:   1. Abnormal findings on diagnostic imaging of lung  Proceed forward with already placed referral to lung cancer screening program.  This likely will mean that you will have to have a shared decision visit with Eric Form, NP in our office next year  We will refer you today to our lung cancer screening program >>>This is based off of your 71 pack-year smoking history >>> This is a recommendation from the [place] preventative services task force (USPSTF) >>>The USPSTF recommends annual screening for lung cancer with low-dose computed tomography (LDCT) in adults aged 71 to 80 years who have a 30 pack-year smoking history and currently smoke or have quit within the past 15 years. Screening should be discontinued once a person has not smoked for 15 years or develops a health problem that substantially limits life expectancy or the ability or willingness to have curative lung surgery.   Our office will call you and set up an appointment with Eric Form (Nurse Practitioner) who leads this program.  This appointment takes place in our office.  After completing this meeting with Eric Form NP you will get a low-dose CT as the screening >>>We will call you with those results  2. Pulmonary nodule  Follow with lung cancer screening CT as stated above  3. DISORDER, TOBACCO USE  We recommend that you stop smoking.  >>>You need to set a quit date >>>If you have friends or family who smoke, let them know you are trying to quit and not to smoke around you or in your living environment  Smoking Cessation Resources:  1 800 QUIT NOW  >>> Patient to call this resource and utilize it to help support her quit smoking >>> Keep up your hard work with stopping smoking  You can also contact the [facility] >>>For smoking cessation classes call [phone]  We do not recommend using e-cigarettes as a form of stopping smoking  You can sign up for  smoking cessation support texts and information:  >>>https://smokefree.gov/smokefreetxt  you need the Pneumovax 23 as well as her flu vaccine in next office visit  4. Chronic respiratory failure with hypoxia ([facility])  Continue oxygen therapy as prescribed - 1-2L at night >>>maintain oxygen saturations greater than 88 percent  >>>if unable to maintain oxygen saturations please contact the office  >>>do not smoke with oxygen  >>>can use nasal saline gel or nasal saline rinses to moisturize nose if oxygen causes dryness   5. Chronic obstructive pulmonary disease, unspecified COPD type ([place])  We will continue to monitor this clinically You need to stop smoking  You need flu vaccine as well as pneumonia vaccine at next office visit  6. ILD (interstitial lung disease) ([place])  We will follow this clinically Notify our office if you start having worsening shortness of breath or problems with your breathing, so we can obtain a chest x-ray, if that chest x-ray is abnormal then may need to consider bronchoscopy with Dr. Elsworth Soho    Return in about 6 weeks (around 11/30/2018), or if symptoms worsen or fail to improve, for Follow up with Dr. Elsworth Soho.   Please do your part to reduce the spread of COVID-19:      Reduce your risk of any infection  and COVID19 by using the similar precautions used for avoiding the common cold or flu:  Marland Kitchen Wash your hands often with soap and warm water for at least 20 seconds.  If soap and water are not readily available, use an alcohol-based hand sanitizer with at least 60% alcohol.  . If coughing or sneezing, cover your mouth and nose by coughing or sneezing into the elbow areas of your shirt or coat, into a tissue or into your sleeve (not your hands). Langley Gauss A MASK when in public  . Avoid shaking hands with others and consider head nods or verbal greetings only. . Avoid touching your eyes, nose, or mouth with unwashed hands.  . Avoid close contact with people who are  sick. . Avoid places or events with large numbers of people in one location, like concerts or sporting events. . If you have some symptoms but not all symptoms, continue to monitor at home and seek medical attention if your symptoms worsen. . If you are having a medical emergency, call 911.   [place] / e-Visit: [email or web address]         [facility]: [facility]: [id number]                   [facility]: [id number]     It is flu season:   >>> Best ways to protect herself from the flu: Receive the yearly flu vaccine, practice good hand hygiene washing with soap and also using hand sanitizer when available, eat a nutritious meals, get adequate rest, hydrate appropriately   Please contact the office if your symptoms worsen or you have concerns that you are not improving.   Thank you for choosing [facility] for your healthcare, and for allowing [place] to partner with you on your healthcare journey. I am thankful to be able to provide care to you today.   Wyn Quaker FNP-C    Health Risks of Smoking Smoking cigarettes is very bad for your health. Tobacco smoke has over 200 known poisons in it. It contains the poisonous gases nitrogen oxide and carbon monoxide. There are over 60 chemicals in tobacco smoke that cause cancer. Smoking is difficult to quit because a chemical in tobacco, called nicotine, causes addiction or dependence. When you smoke and inhale, nicotine is absorbed rapidly into the bloodstream through your lungs. Both inhaled and non-inhaled nicotine may be addictive. What are the risks of cigarette smoke? Cigarette smokers have an increased risk of many serious medical problems, including:  Lung cancer.  Lung disease, such as pneumonia, bronchitis, and emphysema.  Chest pain (angina) and heart attack  because the heart is not getting enough oxygen.  Heart disease and peripheral blood vessel disease.  High blood pressure (hypertension).  Stroke.  Oral cancer, including cancer of the lip, mouth, or voice box.  Bladder cancer.  Pancreatic cancer.  Cervical cancer.  Pregnancy complications, including premature birth.  Stillbirths and smaller newborn babies, birth defects, and genetic damage to sperm.  Early menopause.  Lower estrogen level for women.  Infertility.  Facial wrinkles.  Blindness.  Increased risk of broken bones (fractures).  Senile dementia.  Stomach ulcers and internal bleeding.  Delayed wound healing and increased risk of complications during surgery.  Even smoking lightly shortens your life expectancy by several years. Because of secondhand smoke exposure, children of smokers have an increased risk of the following:  Sudden infant death syndrome (SIDS).  Respiratory infections.  Lung cancer.  Heart disease.  Ear infections. What are the benefits of quitting? There are many health benefits of quitting smoking. Here are some  of them:  Within days of quitting smoking, your risk of having a heart attack decreases, your blood flow improves, and your lung capacity improves. Blood pressure, pulse rate, and breathing patterns start returning to normal soon after quitting.  Within months, your lungs may clear up completely.  Quitting for 10 years reduces your risk of developing lung cancer and heart disease to almost that of a nonsmoker.  People who quit may see an improvement in their overall quality of life. How do I quit smoking?     Smoking is an addiction with both physical and psychological effects, and longtime habits can be hard to change. Your health care provider can recommend:  Programs and community resources, which may include group support, education, or talk therapy.  Prescription medicines to help reduce cravings.  Nicotine  replacement products, such as patches, gum, and nasal sprays. Use these products only as directed. Do not replace cigarette smoking with electronic cigarettes, which are commonly called e-cigarettes. The safety of e-cigarettes is not known, and some may contain harmful chemicals.  A combination of two or more of these methods. Where to find more information  American Lung Association: www.lung.org  American Cancer Society: www.cancer.org Summary  Smoking cigarettes is very bad for your health. Cigarette smokers have an increased risk of many serious medical problems, including several cancers, heart disease, and stroke.  Smoking is an addiction with both physical and psychological effects, and longtime habits can be hard to change.  By stopping right away, you can greatly reduce the risk of medical problems for you and your family.  To help you quit smoking, your health care provider can recommend programs, community resources, prescription medicines, and nicotine replacement products such as patches, gum, and nasal sprays. This information is not intended to replace advice given to you by your health care provider. Make sure you discuss any questions you have with your health care provider. Document Released: 03/19/2004 Document Revised: 05/13/2017 Document Reviewed: 02/14/2016 Elsevier Patient Education  2020 [employer].

## 2018-10-19 NOTE — Assessment & Plan Note (Signed)
Reviewed CT with patient today over the phone Patient continues to smoke Active referral for lung cancer screening program  Plan: Continue forward with CT follow-up in lung cancer screening program in 2021 to follow stable pulmonary nodules as patient is a current smoker with over 75-pack-year smoking history

## 2018-10-19 NOTE — Assessment & Plan Note (Signed)
Plan: Continue forward with referral with lung cancer screening program as she is an active smoker and has CT from 09/29/2018 that shows stable 5 mm right lower lobe and 4 mm left lower lobe pulmonary nodules, favored to be benign

## 2018-10-23 ENCOUNTER — Other Ambulatory Visit: Payer: Self-pay | Admitting: Pulmonary Disease

## 2018-10-24 ENCOUNTER — Telehealth (INDEPENDENT_AMBULATORY_CARE_PROVIDER_SITE_OTHER): Payer: Medicare Other | Admitting: Physician Assistant

## 2018-10-24 ENCOUNTER — Other Ambulatory Visit: Payer: Self-pay

## 2018-10-24 VITALS — BP 125/69 | HR 103 | Ht 60.87 in | Wt 155.0 lb

## 2018-10-24 DIAGNOSIS — I483 Typical atrial flutter: Secondary | ICD-10-CM | POA: Diagnosis not present

## 2018-10-24 DIAGNOSIS — I2583 Coronary atherosclerosis due to lipid rich plaque: Secondary | ICD-10-CM

## 2018-10-24 DIAGNOSIS — J849 Interstitial pulmonary disease, unspecified: Secondary | ICD-10-CM

## 2018-10-24 DIAGNOSIS — I2510 Atherosclerotic heart disease of native coronary artery without angina pectoris: Secondary | ICD-10-CM

## 2018-10-24 DIAGNOSIS — N183 Chronic kidney disease, stage 3 unspecified: Secondary | ICD-10-CM

## 2018-10-24 DIAGNOSIS — I10 Essential (primary) hypertension: Secondary | ICD-10-CM

## 2018-10-24 MED ORDER — METOPROLOL SUCCINATE ER 50 MG PO TB24: ORAL_TABLET | ORAL | 3 refills | Status: AC

## 2018-10-24 NOTE — Progress Notes (Signed)
Virtual Visit via Telephone Note   This visit type was conducted due to national recommendations for restrictions regarding the COVID-19 Pandemic (e.g. social distancing) in an effort to limit this patient's exposure and mitigate transmission in our community.  Due to her co-morbid illnesses, this patient is at least at moderate risk for complications without adequate follow up.  This format is felt to be most appropriate for this patient at this time.  The patient did not have access to video technology/had technical difficulties with video requiring transitioning to audio format only (telephone).  All issues noted in this document were discussed and addressed.  No physical exam could be performed with this format.  Please refer to the patient's chart for her  consent to telehealth for [facility].   Date:  10/24/2018   ID:  April Branch, DOB [birth year], MRN [id number]  Patient Location: Home Provider Location: Home  PCP:  [name], MD  Cardiologist:  [name], MD   Electrophysiologist:  None   Evaluation Performed:  Follow-Up Visit  Chief Complaint: Post hospital follow-up; atrial flutter  History of Present Illness:    April Branch is a 73 y.o. female with:  Coronary calcification  Minimal plaque on cardiac catheterization 2013  Myoview 3/19: No ischemia  Peripheral arterial disease  S/p left femoral to popliteal bypass  Carotid artery disease  Palpitations  Holter monitor 2018: PACs/PVCs (<1%)  Diabetes mellitus  Chronic kidney disease 3  Hypertension  Hyperlipidemia  Hypothyroidism  History of bradycardia in the setting of severe hypothyroidism  COPD/interstitial lung disease  Questionable adrenal insufficiency  Chronic anemia  Hx of TIA  April Branch was last seen by Dr. Johnsie Cancel in February 2020.  She was recently admitted 8/14-8/18 after a fall.  She presented with confusion that was felt to be related to dehydration in the  setting of chronic opiate use.  She was also noted to have worsening creatinine and hypoglycemia.  Her insulin was adjusted.  X-rays demonstrated a right distal fibular fracture and she was placed in a cast with plans to follow-up with orthopedics as an outpatient.  Of note, she was noted to be in atrial flutter on electrocardiogram.  She was placed on Xarelto for anticoagulation and set up for follow-up with cardiology.  CHADS2-VASc=7 (TIA, age x 1, HTN, Diab, Vascular Dz, female). Creatinine clearance based upon creatinine of 1.32, wt 169 lbs:  47 cc/min (Xarelto dose 15 mg)  Today, she notes that she has done well since discharge.  She has not had chest pain.  She has chronic shortness of breath without significant change.  She denies orthopnea.  Her right ankle is swollen from the fracture.  She has issues with venous insufficiency.  Her Lasix dose was cut back in the hospital due to acute kidney injury.  She has noticed more swelling on the left.  She has not had syncope or near syncope.  She does note a history of frequent falls.  She tells me she has fallen about 15 times in the last 6 months.  She walks with a cane and has not been evaluated by physical therapy.  The patient does not have symptoms concerning for COVID-19 infection (fever, chills, cough, or new shortness of breath).    Past Medical History:  Diagnosis Date  . Anemia   . Anxiety   . Arthritis   . CAD (coronary artery disease) 2016   non-obstructive at cath  . Colon polyps 09/11/2010   Tubular adenoma  .  COPD 12/14/2008   PATIENT DENIES    . Depression   . Diabetic coma ([place]) Feb. 2014  . Fall at home 2016   x 2 in January 2016, Left knee  . Fibromyalgia   . GERD 07/20/2006  . Headache(784.0)   . History of transient ischemic attack (TIA)   . HPV (human papilloma virus) infection   . Hyperlipidemia   . Hypertension   . Hypothyroidism   . 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 Lumbar  disc disease   . Neuromuscular disorder ([place])    PERIPHERAL NEUROPATHY  . Peripheral vascular disease ([place])   . Personal history of malignant neoplasm of kidney(V10.52) 12/14/2008   Laparoscopic biopsy and cryoablation 7/08 Dr. Vernie Shanks   . Renal disorder    chronic kidney disease   . Stroke [facility])    3 MINI STROKES  RT SIDED WEAKNESS  . Vertigo    Past Surgical History:  Procedure Laterality Date  . ABDOMINAL AORTAGRAM N/A 08/21/2011   Procedure: ABDOMINAL Maxcine Ham;  Surgeon: Elam Dutch, MD;  Location: [facility] CATH LAB;  Service: Cardiovascular;  Laterality: N/A;  . ABDOMINAL AORTAGRAM N/A 03/16/2014   Procedure: ABDOMINAL Maxcine Ham;  Surgeon: Elam Dutch, MD;  Location: [facility] CATH LAB;  Service: Cardiovascular;  Laterality: N/A;  . ABDOMINAL HYSTERECTOMY    . APPENDECTOMY    . BLADDER SUSPENSION    . CARDIAC CATHETERIZATION    . CHOLECYSTECTOMY OPEN    . ESOPHAGOGASTRODUODENOSCOPY (EGD) WITH PROPOFOL N/A 05/16/2014   Procedure: ESOPHAGOGASTRODUODENOSCOPY (EGD) WITH PROPOFOL with Balloon dilation;  Surgeon: Milus Banister, MD;  Location: [place];  Service: Endoscopy;  Laterality: N/A;  . ESOPHAGOGASTRODUODENOSCOPY (EGD) WITH PROPOFOL N/A 09/02/2015   Procedure: ESOPHAGOGASTRODUODENOSCOPY (EGD) WITH PROPOFOL;  Surgeon: Doran Stabler, MD;  Location: [place];  Service: Gastroenterology;  Laterality: N/A;  . FEMORAL-POPLITEAL BYPASS GRAFT  10/12/2011   Procedure: BYPASS GRAFT FEMORAL-POPLITEAL ARTERY;  Surgeon: Elam Dutch, MD;  Location[facility] OR;  Service: Vascular;  Laterality: Left;  Left Femoral-Popliteal Bypass Graft using 42mm x 80cm Propaten Graft with intraop arteriogram times one.  . FEMORAL-POPLITEAL BYPASS GRAFT Left 04/17/2014   Procedure: REDO LEFT FEMORAL-POPLITEAL ARTERY BYPASS USING GORE PROPATEN 109mmx80cm GRAFT;  Surgeon: Elam Dutch, MD;  Location: [place];  Service: Vascular;  Laterality: Left;  . FOOT SURGERY Left    "bone spurs"  . INTRAOPERATIVE  ARTERIOGRAM  10/12/2011   Procedure: INTRA OPERATIVE ARTERIOGRAM;  Surgeon: Elam Dutch, MD;  Location: [place];  Service: Vascular;  Laterality: Left;  . KNEE ARTHROSCOPY Bilateral    "cartilage"  . Laparascopic cryoablation of left kidney  08/2006   Dr. Gladis Riffle for renal cell cancer  . LEFT HEART CATHETERIZATION WITH CORONARY ANGIOGRAM N/A 05/11/2011   Procedure: LEFT HEART CATHETERIZATION WITH CORONARY ANGIOGRAM;  Surgeon: Jolaine Artist, MD;  Location[facility] CATH LAB;  Service: Cardiovascular;  Laterality: N/A;  . LUNG SURGERY Right    lung nodule removed from the right side  . PR VEIN BYPASS GRAFT,AORTO-FEM-POP  05/27/2010  . SHOULDER ARTHROSCOPY Bilateral    'Spurs"  . TUBAL LIGATION       No outpatient medications have been marked as taking for the 10/24/18 encounter (Appointment) with Richardson Dopp T, PA-C.     Allergies:   Patient has no known allergies.   Social History   Tobacco Use  . Smoking status: Current Every Day Smoker    Packs/day: 1.50    Years: 50.00  Pack years: 75.00    Types: Cigarettes    Last attempt to quit: 06/24/2014    Years since quitting: 4.3  . Smokeless tobacco: Never Used  . Tobacco comment: 08/15/2018 5 cigs a day  Substance Use Topics  . Alcohol use: No    Alcohol/week: 0.0 standard drinks  . Drug use: No     Family Hx: The patient's family history includes Cancer in her father and mother; Heart disease in her father, mother, and sister; Lung cancer in her father and mother.  ROS:   Please see the history of present illness.    All other systems reviewed and are negative.   Prior CV studies:   The following studies were reviewed today:  Echocardiogram 10/10/2018 EF [id number], normal diastolic function, normal RV SF  Carotid US 03/17/2018 Bilateral ICA 40-59  Echocardiogram 03/17/2018 EF 60-65, normal wall motion  48-hour Holter 04/2017 NSR Average HR 71 PAC;s and PVC;s both <1% total beats Continue beta blocker  Myoview  04/29/2017 EF 65, no ischemia or infarction, normal study  Cardiac catheterization 05/11/2011 Left main:  Normal LAD: Large vessel which tails to the L distally. 20-30% mid lesion. LCX: Large co-dominant vessel. Large OM-1. Small OM-2. Several small PLs. 30% stenosis mid AV groove RCA: Small co-dominant vessel. Diffuse 30% prox through mid section. 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    Labs/Other Tests and Data Reviewed:    EKG:  An ECG dated 10/10/2018 was personally reviewed today and demonstrated:  counterclockwise typical atrial flutter with variable AV block, HR 95, QTc 447    Recent Labs: 04/02/2018: B Natriuretic Peptide 331.9 09/05/2018: TSH 0.40 10/10/2018: Magnesium 1.5 10/11/2018: ALT 12; BUN 18; Creatinine, Ser 1.32; Hemoglobin 10.8; Platelets 260; Potassium 4.7; Sodium 139   Recent Lipid Panel Lab Results  Component Value Date/Time   CHOL 200 07/21/2018 03:00 PM   CHOL 219 (H) 04/20/2017 10:47 AM   TRIG 322.0 (H) 07/21/2018 03:00 PM   HDL 50.80 07/21/2018 03:00 PM   HDL 48 04/20/2017 10:47 AM   CHOLHDL 4 07/21/2018 03:00 PM   LDLCALC 64 03/17/2018 05:07 AM   LDLCALC Comment 04/20/2017 10:47 AM   LDLDIRECT 99.0 07/21/2018 03:00 PM    Wt Readings from Last 3 Encounters:  10/10/18 169 lb 11.2 oz (77 kg)  09/05/18 163 lb 12.8 oz (74.3 kg)  08/15/18 165 lb (74.8 kg)     Objective:    Vital Signs:  There were no vitals taken for this visit.   VITAL SIGNS:  reviewed GEN:  no acute distress RESPIRATORY:  No labored breathing NEURO:  Alert and oriented PSYCH:  Normal mood  ASSESSMENT & PLAN:    1. Typical atrial flutter ([place]) She has typical atrial flutter in a counterclockwise pattern based upon her ECG from the hospital.  I suspect that this type of atrial flutter could be ablated.  Therefore, I will refer her to electrophysiology for further  evaluation.  She has significant risk for stroke.  CHADS2-VASc=7.  However, she has a history of frequent falls.  She may not be an ideal long-term candidate for anticoagulation.  Recent creatinine clearance 47.  Continue Xarelto 15 mg daily.  I will arrange a follow-up BMET.  If her renal function improves, we will need to adjust her Xarelto dose accordingly.  Her heart rate can be better controlled.  Therefore, I will increase her Toprol to 75 mg  daily.  2. Coronary artery disease due to lipid rich plaque History of mild plaque by cardiac catheterization March 2013.  Myoview in March 2019 was negative for ischemia.  He is not having any anginal symptoms.  Continue statin therapy.  3. CKD (chronic kidney disease), stage III ([place]) She was noted to have acute kidney injury in the hospital.  This was on top of chronic kidney disease.  Her Lasix dose was cut back.  She does have venous insufficiency and I have asked her to try to keep her leg elevated.  She notes that her primary care physician told her she could not use compression stockings.  I have asked her to confirm this with her PCP and don compression stockings if there is no contraindication.  I will arrange a follow-up BMET in the next 1 to 2 weeks.  4. Essential hypertension The patient's blood pressure is controlled on her current regimen.  Continue current therapy.   5. ILD (interstitial lung disease) ([place]) Continue follow-up with pulmonology.   Time:   Today, I have spent 18 minutes with the patient with telehealth technology discussing the above problems.    Medication Adjustments/Labs and Tests Ordered: Current medicines are reviewed at length with the patient today.  Concerns regarding medicines are outlined above.   Tests Ordered: No orders of the defined types were placed in this encounter.   Medication Changes: No orders of the defined types were placed in this encounter.   Follow Up:  In Person visit with EP  Signed,  Richardson Dopp, PA-C  10/24/2018 1:18 PM    [facility]

## 2018-10-24 NOTE — Patient Instructions (Addendum)
Medication Instructions:  INCREASE: Metoprolol to 75 mg once a day   If you need a refill on your cardiac medications before your next appointment, please call your pharmacy.   Lab work: FUTURE: BMET on 11/07/2018   If you have labs (blood work) drawn today and your tests are completely normal, you will receive your results only by: Marland Kitchen MyChart Message (if you have MyChart) OR . A paper copy in the mail If you have any lab test that is abnormal or we need to change your treatment, we will call you to review the results.  Testing/Procedures: None  Follow-Up: You have been referred to see one of our Electrophysiologist for atrial flutter  Any Other Special Instructions Will Be Listed Below (If Applicable).

## 2018-10-27 NOTE — Progress Notes (Signed)
Infiltrates have resolved on CT suggesting inflammatory etiology - whether this was related to steroids or spontaneous is unclear. Smoking cessation clearly paramount here

## 2018-11-07 ENCOUNTER — Ambulatory Visit (INDEPENDENT_AMBULATORY_CARE_PROVIDER_SITE_OTHER): Payer: Medicare Other | Admitting: Endocrinology

## 2018-11-07 ENCOUNTER — Encounter: Payer: Self-pay | Admitting: Endocrinology

## 2018-11-07 ENCOUNTER — Other Ambulatory Visit: Payer: Medicare Other | Admitting: *Deleted

## 2018-11-07 ENCOUNTER — Other Ambulatory Visit: Payer: Self-pay

## 2018-11-07 VITALS — BP 134/60 | HR 85 | Ht 60.87 in | Wt 159.8 lb

## 2018-11-07 DIAGNOSIS — E1165 Type 2 diabetes mellitus with hyperglycemia: Secondary | ICD-10-CM | POA: Diagnosis not present

## 2018-11-07 DIAGNOSIS — Z23 Encounter for immunization: Secondary | ICD-10-CM

## 2018-11-07 DIAGNOSIS — N183 Chronic kidney disease, stage 3 unspecified: Secondary | ICD-10-CM

## 2018-11-07 DIAGNOSIS — Z794 Long term (current) use of insulin: Secondary | ICD-10-CM

## 2018-11-07 DIAGNOSIS — E1142 Type 2 diabetes mellitus with diabetic polyneuropathy: Secondary | ICD-10-CM | POA: Diagnosis not present

## 2018-11-07 DIAGNOSIS — E038 Other specified hypothyroidism: Secondary | ICD-10-CM

## 2018-11-07 LAB — POCT GLYCOSYLATED HEMOGLOBIN (HGB A1C): Hemoglobin A1C: 7.2 % — AB (ref 4.0–5.6)

## 2018-11-07 NOTE — Progress Notes (Signed)
Patient ID: April Branch, female   DOB: 1945/08/13, 73 y.o.   MRN: [id number]           Reason for Appointment: Follow-up  for Type 2 Diabetes  Referring physician: Pattricia Boss  History of Present Illness:          Date of diagnosis of type 2 diabetes mellitus: 1980s       Previous history:    She thinks she was treated with metformin for some time before she went on insulin in ?  2002   She has mostly been treated with Lantus and rapid acting insulin such as Apidra or Novolog.   She thinks that Apidra worked fairly fast but insurance did not cover this, generally has had poor control   On her initial consultation she was switched from Lantus to [employer] because of blood sugars mostly over 300  Was also started on metformin ER 750 mg, 2 tablets daily initially and  in 3/17 she was started on Invokana 100 mg daily because of  poor control  Recent history:   INSULIN regimen: OMNIPOD pump using Humalog U-100 insulin  Current settings: Basal rate: Midnight = 1.35, 8 a.m. = 2.1units/h, 12 PM = 2.5, 10 PM = 2.0 Bolus presets 4 units breakfast, 4 units lunch and 4-6 at dinnertime  Recent basal daily dose 48 units Correction factor I: 60, target 125   Current blood sugar patterns and problems identified:  Her last A1c was up to 9.9 and is now 7.2  This improvement may be partly related to hypoglycemia recently  Not clear why her blood sugars are now much lower than before and she has had lower blood sugar readings most of the day except late evenings even without boluses  Previously also had been on steroids  Overall she does not think she is eating as much and her weight is down 4 pounds since her last visit, this tends to fluctuate  She is still not checking her blood sugars on a regular basis, partly because she does not eat more than 1 meal a day  HYPOGLYCEMIA has been occurring overnight around 3 AM, midday, early afternoon and sometimes late evening although not recently   She does not monitor her blood sugar when she feels hypoglycemic during the night  However she is treating her low blood sugars with cookies and not with juice or other simple sugars  She does generally eat a full meal when she is eating once a day either at lunch or dinner  Because of forgetting her controller she will not bolus when she is eating out  Has only 2 boluses in her pump data when her blood sugars were significantly high in the evening  Today her blood sugar was high this morning because she did not change her pod last evening  Previously was on Invokana but this was stopped because of renal dysfunction times and low blood pressure  Oral hypoglycemic drugs the patient is taking are: None  Side effects from medications have been: none  Compliance with the medical regimen: Inconsistent   Glucose monitoring:  done 2-3 times a day         Glucometer:  Accu-Chek     Blood Glucose readings by download and monitor:   PRE-MEAL Fasting Lunch Dinner Bedtime Overall  Glucose range:  54-112  53-167  73-273   46-322  Mean/median:      129   POST-MEAL PC Breakfast PC Lunch PC Dinner  Glucose range:  46- 322  Mean/median:      PREVIOUS readings:  PRE-MEAL Fasting  midday Dinner Bedtime Overall  Glucose range:  59-200  64-240  114-298  70-344   Mean/median:  111   0.78  179             POST-MEAL PC Breakfast PC Lunch PC Dinner  Glucose range:     Mean/median:   207        Self-care:    Typical meal intake: Breakfast is variable at 11 AM, frequently no lunch, dinner 7 pm              Dietician visit, most recent: never               Exercise: Unable to do much physical activity  Weight history:  Highest previous weight 199   Wt Readings from Last 3 Encounters:  11/07/18 159 lb 12.8 oz (72.5 kg)  10/24/18 155 lb (70.3 kg)  10/10/18 169 lb 11.2 oz (77 kg)    Glycemic control:   Lab Results  Component Value Date   HGBA1C 7.2 (A) 11/07/2018   HGBA1C 9.9  (A) 07/21/2018   HGBA1C 7.9 (H) 04/03/2018   Lab Results  Component Value Date   MICROALBUR 1.8 08/17/2017   LDLCALC 64 03/17/2018   CREATININE 1.32 (H) 10/11/2018    Office Visit on 11/07/2018  Component Date Value Ref Range Status  . Hemoglobin A1C 11/07/2018 7.2* 4.0 - 5.6 % Final    OTHER active problems: See review of systems    Allergies as of 11/07/2018   No Known Allergies     Medication List       Accurate as of November 07, 2018 12:44 PM. If you have any questions, ask your nurse or doctor.        STOP taking these medications   methylPREDNISolone 4 MG Tbpk tablet Commonly known as: MEDROL DOSEPAK Stopped by: Elayne Snare, MD     TAKE these medications   Accu-Chek FastClix Lancets Misc Use 4 per day to check blood sugar dx code E11.9   amLODipine 10 MG tablet Commonly known as: NORVASC Take 10 mg by mouth daily.   aspirin EC 81 MG tablet Take 1 tablet (81 mg total) by mouth daily.   cilostazol 100 MG tablet Commonly known as: PLETAL TAKE 1 TABLET BY MOUTH TWICE A DAY   Contour Next Test test strip Generic drug: glucose blood Use to test blood sugar 3 times daily E11.9   DULoxetine 60 MG capsule Commonly known as: CYMBALTA TAKE 1 CAPSULE BY MOUTH ONCE DAILY   ferrous sulfate 325 (65 FE) MG tablet Take 325 mg by mouth 2 (two) times daily.   furosemide 20 MG tablet Commonly known as: Lasix Take 1 tablet (20 mg total) by mouth daily.   HYDROcodone-acetaminophen 10-325 MG tablet Commonly known as: NORCO Take 1 tablet by mouth 3 (three) times daily as needed (for pain). Follow up with PCP if refills needed   insulin lispro 100 UNIT/ML injection Commonly known as: HumaLOG Inject 0.7 mLs (70 Units total) into the skin See admin instructions. Use 200 units every 3 days in insulin pump   Insulin Pen Needle 32G X 4 MM Misc Commonly known as: BD Pen Needle Nano U/F Use to inject insulin   lansoprazole 30 MG capsule Commonly known as: PREVACID  Take 1 capsule (30 mg total) by mouth daily.   levothyroxine 125 MCG tablet Commonly known as: SYNTHROID Take 125 mcg by  mouth daily before breakfast.   metoprolol succinate 50 MG 24 hr tablet Commonly known as: TOPROL-XL Take 1.5 tablets (75 mg total) once a day   ondansetron 4 MG tablet Commonly known as: ZOFRAN Take 4 mg by mouth every 8 (eight) hours as needed.   potassium chloride 10 MEQ tablet Commonly known as: K-DUR Take 10 mEq by mouth 2 (two) times daily.   Rivaroxaban 15 MG Tabs tablet Commonly known as: XARELTO Take 1 tablet (15 mg total) by mouth daily with supper.   rosuvastatin 20 MG tablet Commonly known as: CRESTOR Take 20 mg by mouth daily.   tolterodine 4 MG 24 hr capsule Commonly known as: DETROL LA Take 4 mg by mouth daily.   triamcinolone cream 0.1 % Commonly known as: KENALOG Apply 1 application topically 2 (two) times daily.   Vitamin D (Ergocalciferol) 1.25 MG (50000 UT) Caps capsule Commonly known as: DRISDOL       Allergies: No Known Allergies  Past Medical History:  Diagnosis Date  . Anemia   . Anxiety   . Arthritis   . CAD (coronary artery disease) 2016   non-obstructive at cath  . Colon polyps 09/11/2010   Tubular adenoma  . COPD 12/14/2008   PATIENT DENIES    . Depression   . Diabetic coma ([place]) Feb. 2014  . Fall at home 2016   x 2 in January 2016, Left knee  . Fibromyalgia   . GERD 07/20/2006  . Headache(784.0)   . History of transient ischemic attack (TIA)   . HPV (human papilloma virus) infection   . Hyperlipidemia   . Hypertension   . Hypothyroidism   . Insulin dependent type 2 diabetes mellitus, controlled ([place]) 1989  . Lumbar disc disease   . Neuromuscular disorder ([place])    PERIPHERAL NEUROPATHY  . Peripheral vascular disease ([place])   . Personal history of malignant neoplasm of kidney(V10.52) 12/14/2008   Laparoscopic biopsy and cryoablation 7/08 Dr. Vernie Shanks   . Renal disorder    chronic kidney disease   .  Stroke [facility])    3 MINI STROKES  RT SIDED WEAKNESS  . Vertigo     Past Surgical History:  Procedure Laterality Date  . ABDOMINAL AORTAGRAM N/A 08/21/2011   Procedure: ABDOMINAL Maxcine Ham;  Surgeon: Elam Dutch, MD;  Location: [facility] CATH LAB;  Service: Cardiovascular;  Laterality: N/A;  . ABDOMINAL AORTAGRAM N/A 03/16/2014   Procedure: ABDOMINAL Maxcine Ham;  Surgeon: Elam Dutch, MD;  Location: [facility] CATH LAB;  Service: Cardiovascular;  Laterality: N/A;  . ABDOMINAL HYSTERECTOMY    . APPENDECTOMY    . BLADDER SUSPENSION    . CARDIAC CATHETERIZATION    . CHOLECYSTECTOMY OPEN    . ESOPHAGOGASTRODUODENOSCOPY (EGD) WITH PROPOFOL N/A 05/16/2014   Procedure: ESOPHAGOGASTRODUODENOSCOPY (EGD) WITH PROPOFOL with Balloon dilation;  Surgeon: Milus Banister, MD;  Location: [place];  Service: Endoscopy;  Laterality: N/A;  . ESOPHAGOGASTRODUODENOSCOPY (EGD) WITH PROPOFOL N/A 09/02/2015   Procedure: ESOPHAGOGASTRODUODENOSCOPY (EGD) WITH PROPOFOL;  Surgeon: Doran Stabler, MD;  Location: [place];  Service: Gastroenterology;  Laterality: N/A;  . FEMORAL-POPLITEAL BYPASS GRAFT  10/12/2011   Procedure: BYPASS GRAFT FEMORAL-POPLITEAL ARTERY;  Surgeon: Elam Dutch, MD;  Location[facility] OR;  Service: Vascular;  Laterality: Left;  Left Femoral-Popliteal Bypass Graft using 58mm x 80cm Propaten Graft with intraop arteriogram times one.  . FEMORAL-POPLITEAL BYPASS GRAFT Left 04/17/2014   Procedure: REDO LEFT FEMORAL-POPLITEAL ARTERY BYPASS USING GORE PROPATEN 40mmx80cm GRAFT;  Surgeon: Elam Dutch,  MD;  Location: [place];  Service: Vascular;  Laterality: Left;  . FOOT SURGERY Left    "bone spurs"  . INTRAOPERATIVE ARTERIOGRAM  10/12/2011   Procedure: INTRA OPERATIVE ARTERIOGRAM;  Surgeon: Elam Dutch, MD;  Location: [place];  Service: Vascular;  Laterality: Left;  . KNEE ARTHROSCOPY Bilateral    "cartilage"  . Laparascopic cryoablation of left kidney  08/2006   Dr. Gladis Riffle for renal cell cancer   . LEFT HEART CATHETERIZATION WITH CORONARY ANGIOGRAM N/A 05/11/2011   Procedure: LEFT HEART CATHETERIZATION WITH CORONARY ANGIOGRAM;  Surgeon: Jolaine Artist, MD;  Location[facility] CATH LAB;  Service: Cardiovascular;  Laterality: N/A;  . LUNG SURGERY Right    lung nodule removed from the right side  . PR VEIN BYPASS GRAFT,AORTO-FEM-POP  05/27/2010  . SHOULDER ARTHROSCOPY Bilateral    'Spurs"  . TUBAL LIGATION      Family History  Problem Relation Age of Onset  . Heart disease Mother   . Lung cancer Mother   . Cancer Mother        Lung  . Heart disease Father   . Lung cancer Father   . Cancer Father        Lung  . Heart disease Sister        CABG- Open Heart    Social History:  reports that she has been smoking cigarettes. She has a 75.00 pack-year smoking history. She has never used smokeless tobacco. She reports that she does not drink alcohol or use drugs.    Review of Systems     DEPRESSION: She is treated with Cymbalta along with nortriptyline and nortriptyline was prescribed by PCP Also takes this for neuropathy  Lipid history:  She has good control of LDL with taking pravastatin 20 mg but has high triglycerides    Lab Results  Component Value Date   CHOL 200 07/21/2018   HDL 50.80 07/21/2018   LDLCALC 64 03/17/2018   LDLDIRECT 99.0 07/21/2018   TRIG 322.0 (H) 07/21/2018   CHOLHDL 4 07/21/2018            Neurological:    Has no numbness,but does have pains , burning and tingling in feet.   Symptoms had improved with  Cymbalta  60 mg in addition to gabapentin  She is taking gabapentin 300 mg again although she was told to stop this when she had encephalopathy in the hospital, otherwise has significant pain  She is also being treated with nortriptyline by PCP  She has had  peripheral vascular disease and treated with Pletal from vascular surgeon  Long history of thyroid disease, hypothyroidism has been treated with 150 micrograms, 7-1/2 tablets a week  No  unusual fatigue  Lab Results  Component Value Date   TSH 0.40 09/05/2018    Last  Foot exam findings: Monofilament sensation is reduced across most of the toes and distal plantar surfaces Absent pedal pulses  ORTHOSTASIS: She was started on Florinef 3 times a week when her blood pressure was 90 systolic standing up However this has been discontinued on her hospitalization last month  HYPERTENSION: She is now on antihypertensives with amlodipine and metoprolol To be followed by PCP She is also on Lasix for history of edema  Her last echocardiogram in January 2020 was normal  Lab Results  Component Value Date   K 4.7 10/11/2018   She is asking about rashes in different areas including arms and legs and upper back with some itching and scaling and  she is asking about psoriasis      She recently had fibular fracture from fall  Physical Examination:  BP 134/60 (BP Location: Left Arm, Patient Position: Sitting, Cuff Size: Normal)   Pulse 85   Ht 5' 0.87" (1.546 m)   Wt 159 lb 12.8 oz (72.5 kg)   SpO2 96%   BMI 30.32 kg/m   She has irregular slightly scaly and only mildly erythematous patchy rashes, some of them circinate on extremities, minimal on the back No ankle edema   ASSESSMENT:   Diabetes type 2, on Omnipod insulin pump  See history of present illness for detailed discussion of current management, blood sugar patterns and problems identified  Her A1c is now 7.2  She appears to be having much lower blood sugars now since at least last month and not clear why  Most of her low sugars are overnight and during the early part of the day from her monitoring However can do better with more frequent monitoring Also is not taking boluses to cover her meals that include significant amount of carbohydrate at times causing some high readings also She forgets to do the bolus or is not taking her PDM when she is eating out  ORTHOSTATIC hypotension: Resolved and she is now  on blood pressure medications, to be followed by PCP  HYPOTHYROIDISM: She will need to have her thyroid levels checked on the next visit   PLAN:    She will need check her blood sugars more consistently 3-4 times a day Make sure she changes her insulin pod every day, she did not change it last night She will need to make sure she boluses before every meal especially if it has carbohydrate She will enter the blood sugar at the time of bolus also Basal rates will be changed as below  Discussed appropriate treatment of hypoglycemia and not to eat cookies for hypoglycemia To call if she has further hypoglycemia   Basal rate changes: Midnight = 1.2, 8 AM-11 AM = 1. 7, 11 AM- 3 PM = 2.0, 3 PM-10 PM = 2.4 and 10 PM = 1.8 No change in boluses  May continue gabapentin  She will see her PCP tomorrow, discussed that she may have tenia corporis causing her rash    There are no Patient Instructions on file for this visit.  Counseling time on subjects discussed in assessment and plan sections is over 50% of today's 25 minute visit     Elayne Snare 11/07/2018, 12:44 PM   Note: This office note was prepared with Dragon voice recognition system technology. Any transcriptional errors that result from this process are unintentional.

## 2018-11-08 ENCOUNTER — Telehealth: Payer: Self-pay | Admitting: Internal Medicine

## 2018-11-08 ENCOUNTER — Other Ambulatory Visit: Payer: Self-pay | Admitting: *Deleted

## 2018-11-08 DIAGNOSIS — Z79899 Other long term (current) drug therapy: Secondary | ICD-10-CM

## 2018-11-08 DIAGNOSIS — N289 Disorder of kidney and ureter, unspecified: Secondary | ICD-10-CM

## 2018-11-08 LAB — BASIC METABOLIC PANEL
BUN/Creatinine Ratio: 10 — ABNORMAL LOW (ref 12–28)
BUN: 16 mg/dL (ref 8–27)
CO2: 20 mmol/L (ref 20–29)
Calcium: 8.9 mg/dL (ref 8.7–10.3)
Chloride: 99 mmol/L (ref 96–106)
Creatinine, Ser: 1.67 mg/dL — ABNORMAL HIGH (ref 0.57–1.00)
GFR calc Af Amer: 35 mL/min/{1.73_m2} — ABNORMAL LOW (ref >59)
GFR calc non Af Amer: 31 mL/min/{1.73_m2} — ABNORMAL LOW (ref >59)
Glucose: 293 mg/dL — ABNORMAL HIGH (ref 65–99)
Potassium: 4.2 mmol/L (ref 3.5–5.2)
Sodium: 139 mmol/L (ref 134–144)

## 2018-11-08 MED ORDER — POTASSIUM CHLORIDE ER 10 MEQ PO TBCR: EXTENDED_RELEASE_TABLET | ORAL | 3 refills | Status: AC

## 2018-11-08 MED ORDER — FUROSEMIDE 20 MG PO TABS: 20.0000 mg | ORAL_TABLET | ORAL | 3 refills | Status: AC

## 2018-11-08 NOTE — Telephone Encounter (Signed)
New Message ° ° ° °Pt is returning call for results  ° ° °Please call  °

## 2018-11-08 NOTE — Telephone Encounter (Signed)
The patient has been notified of the result and verbalized understanding.  All questions (if any) were answered. Frederik Schmidt, RN 11/08/2018 2:21 PM

## 2018-11-22 ENCOUNTER — Other Ambulatory Visit: Payer: Medicare Other

## 2018-11-23 ENCOUNTER — Ambulatory Visit (INDEPENDENT_AMBULATORY_CARE_PROVIDER_SITE_OTHER): Payer: Medicare Other | Admitting: Internal Medicine

## 2018-11-23 ENCOUNTER — Encounter: Payer: Self-pay | Admitting: Internal Medicine

## 2018-11-23 ENCOUNTER — Other Ambulatory Visit: Payer: Medicare Other | Admitting: *Deleted

## 2018-11-23 ENCOUNTER — Other Ambulatory Visit: Payer: Self-pay

## 2018-11-23 VITALS — BP 138/68 | HR 86 | Ht 60.0 in | Wt 150.0 lb

## 2018-11-23 DIAGNOSIS — I483 Typical atrial flutter: Secondary | ICD-10-CM

## 2018-11-23 DIAGNOSIS — Z79899 Other long term (current) drug therapy: Secondary | ICD-10-CM

## 2018-11-23 DIAGNOSIS — N289 Disorder of kidney and ureter, unspecified: Secondary | ICD-10-CM

## 2018-11-23 NOTE — Progress Notes (Signed)
ELECTROPHYSIOLOGY CONSULT NOTE  Patient ID: JANNATUL DROZE, MRN: [id number], DOB/AGE: 71-Apr-1947 71 y.o. Admit date: (Not on file) Date of Consult: 11/23/2018  Primary Physician: [name], MD Primary Cardiologist: Raynaldo Opitz     TARISHA HERBST is a 73 y.o. female who is being seen today for the evaluation of AFlutter at the request of Pni.    HPI April Branch is a 73 y.o. female referred for consideration of ablation for atrial flutter.  She has a history of recurrent falls.  1 of these resulted in an emergency room visit wherein she was demonstrated to be in typical atrial flutter.  She was discharged from hospital on rivaroxaban and referred  She has COPD and chronic dyspnea.  No known coronary disease, remote catheterization demonstrating nonobstructive lesions (2016) prior TIA.  Peripheral vascular disease, diabetes.  Thromboembolic risk factors ( age -64, HTN-1, TIA/CVA-2, DM-1, Vasc disease -1, Gender-1) for a CHADSVASc Score of >=7  Echocardiogram 8/20 demonstrated normal LV function normal left atrial size  Past Medical History:  Diagnosis Date  . Anemia   . Anxiety   . Arthritis   . CAD (coronary artery disease) 2016   non-obstructive at cath  . Colon polyps 09/11/2010   Tubular adenoma  . COPD 12/14/2008   PATIENT DENIES    . Depression   . Diabetic coma ([place]) Feb. 2014  . Fall at home 2016   x 2 in January 2016, Left knee  . Fibromyalgia   . GERD 07/20/2006  . Headache(784.0)   . History of transient ischemic attack (TIA)   . HPV (human papilloma virus) infection   . Hyperlipidemia   . Hypertension   . Hypothyroidism   . 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 Lumbar disc disease   . Neuromuscular disorder ([place])    PERIPHERAL NEUROPATHY  . Peripheral vascular disease ([place])   . Personal history of malignant neoplasm of kidney(V10.52) 12/14/2008   Laparoscopic biopsy and cryoablation 7/08 Dr. Vernie Shanks   .  Renal disorder    chronic kidney disease   . Stroke [facility])    3 MINI STROKES  RT SIDED WEAKNESS  . Vertigo       Surgical History:  Past Surgical History:  Procedure Laterality Date  . ABDOMINAL AORTAGRAM N/A 08/21/2011   Procedure: ABDOMINAL Maxcine Ham;  Surgeon: Elam Dutch, MD;  Location: [facility] CATH LAB;  Service: Cardiovascular;  Laterality: N/A;  . ABDOMINAL AORTAGRAM N/A 03/16/2014   Procedure: ABDOMINAL Maxcine Ham;  Surgeon: Elam Dutch, MD;  Location: [facility] CATH LAB;  Service: Cardiovascular;  Laterality: N/A;  . ABDOMINAL HYSTERECTOMY    . APPENDECTOMY    . BLADDER SUSPENSION    . CARDIAC CATHETERIZATION    . CHOLECYSTECTOMY OPEN    . ESOPHAGOGASTRODUODENOSCOPY (EGD) WITH PROPOFOL N/A 05/16/2014   Procedure: ESOPHAGOGASTRODUODENOSCOPY (EGD) WITH PROPOFOL with Balloon dilation;  Surgeon: Milus Banister, MD;  Location: [place];  Service: Endoscopy;  Laterality: N/A;  . ESOPHAGOGASTRODUODENOSCOPY (EGD) WITH PROPOFOL N/A 09/02/2015   Procedure: ESOPHAGOGASTRODUODENOSCOPY (EGD) WITH PROPOFOL;  Surgeon: Doran Stabler, MD;  Location: [place];  Service: Gastroenterology;  Laterality: N/A;  . FEMORAL-POPLITEAL BYPASS GRAFT  10/12/2011   Procedure: BYPASS GRAFT FEMORAL-POPLITEAL ARTERY;  Surgeon: Elam Dutch, MD;  Location[facility] OR;  Service: Vascular;  Laterality: Left;  Left Femoral-Popliteal Bypass Graft using 80mm x 80cm Propaten Graft with intraop arteriogram times one.  . FEMORAL-POPLITEAL BYPASS GRAFT Left 04/17/2014  Procedure: REDO LEFT FEMORAL-POPLITEAL ARTERY BYPASS USING GORE PROPATEN 46mmx80cm GRAFT;  Surgeon: Elam Dutch, MD;  Location: [place];  Service: Vascular;  Laterality: Left;  . FOOT SURGERY Left    "bone spurs"  . INTRAOPERATIVE ARTERIOGRAM  10/12/2011   Procedure: INTRA OPERATIVE ARTERIOGRAM;  Surgeon: Elam Dutch, MD;  Location: [place];  Service: Vascular;  Laterality: Left;  . KNEE ARTHROSCOPY Bilateral    "cartilage"  . Laparascopic  cryoablation of left kidney  08/2006   Dr. Gladis Riffle for renal cell cancer  . LEFT HEART CATHETERIZATION WITH CORONARY ANGIOGRAM N/A 05/11/2011   Procedure: LEFT HEART CATHETERIZATION WITH CORONARY ANGIOGRAM;  Surgeon: Jolaine Artist, MD;  Location: [facility] CATH LAB;  Service: Cardiovascular;  Laterality: N/A;  . LUNG SURGERY Right    lung nodule removed from the right side  . PR VEIN BYPASS GRAFT,AORTO-FEM-POP  05/27/2010  . SHOULDER ARTHROSCOPY Bilateral    'Spurs"  . TUBAL LIGATION       Home Meds: Current Meds  Medication Sig  . ACCU-CHEK FASTCLIX LANCETS MISC Use 4 per day to check blood sugar dx code E11.9  . amLODipine (NORVASC) 10 MG tablet Take 10 mg by mouth daily.  . cilostazol (PLETAL) 100 MG tablet TAKE 1 TABLET BY MOUTH TWICE A DAY  . DULoxetine (CYMBALTA) 60 MG capsule TAKE 1 CAPSULE BY MOUTH ONCE DAILY  . ferrous sulfate 325 (65 FE) MG tablet Take 325 mg by mouth 2 (two) times daily.   . furosemide (LASIX) 20 MG tablet Take 1 tablet (20 mg total) by mouth every other day.  Marland Kitchen glucose blood (CONTOUR NEXT TEST) test strip Use to test blood sugar 3 times daily E11.9  . HYDROcodone-acetaminophen (NORCO) 10-325 MG tablet Take 1 tablet by mouth 3 (three) times daily as needed (for pain). Follow up with PCP if refills needed  . insulin lispro (HUMALOG) 100 UNIT/ML injection Inject 0.7 mLs (70 Units total) into the skin See admin instructions. Use 200 units every 3 days in insulin pump  . Insulin Pen Needle (BD PEN NEEDLE NANO U/F) 32G X 4 MM MISC Use to inject insulin  . lansoprazole (PREVACID) 30 MG capsule Take 1 capsule (30 mg total) by mouth daily.  Marland Kitchen levothyroxine (SYNTHROID) 125 MCG tablet Take 125 mcg by mouth daily before breakfast.  . metoprolol succinate (TOPROL-XL) 50 MG 24 hr tablet Take 1.5 tablets (75 mg total) once a day  . ondansetron (ZOFRAN) 4 MG tablet Take 4 mg by mouth every 8 (eight) hours as needed.   . potassium chloride (K-DUR) 10 MEQ tablet Take 1 tablet (10  meq) twice a day, every other day  . Rivaroxaban (XARELTO) 15 MG TABS tablet Take 1 tablet (15 mg total) by mouth daily with supper.  . rosuvastatin (CRESTOR) 20 MG tablet Take 20 mg by mouth daily.  Marland Kitchen tolterodine (DETROL LA) 4 MG 24 hr capsule Take 4 mg by mouth daily.   Marland Kitchen triamcinolone cream (KENALOG) 0.1 % Apply 1 application topically 2 (two) times daily.  . Vitamin D, Ergocalciferol, (DRISDOL) 1.25 MG (50000 UT) CAPS capsule     Allergies: No Known Allergies  Social History   Socioeconomic History  . Marital status: Widowed    Spouse name: Not on file  . Number of children: 2  . Years of education: 8th grade  . Highest education level: Not on file  Occupational History  . 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 Emergency planning/management officer  strain: Not on file  . Food insecurity    Worry: Not on file    Inability: Not on file  . Transportation needs    Medical: Not on file    Non-medical: Not on file  Tobacco Use  . Smoking status: Current Every Day Smoker    Packs/day: 1.50    Years: 50.00    Pack years: 75.00    Types: Cigarettes    Last attempt to quit: 06/24/2014    Years since quitting: 4.4  . Smokeless tobacco: Never Used  . Tobacco comment: 08/15/2018 5 cigs a day  Substance and Sexual Activity  . Alcohol use: No    Alcohol/week: 0.0 standard drinks  . Drug use: No  . Sexual activity: Never  Lifestyle  . Physical activity    Days per week: Not on file    Minutes per session: Not on file  . Stress: Not on file  Relationships  . Social Herbalist on phone: Not on file    Gets together: Not on file    Attends religious service: Not on file    Active member of club or organization: Not on file    Attends meetings of clubs or organizations: Not on file    Relationship status: Not on file  . Intimate partner violence    Fear of current or ex partner: Not on file    Emotionally abused: Not on file    Physically abused: Not on file     Forced sexual activity: Not on file  Other Topics Concern  . Not on file  Social History Narrative   Widow. Two sons. Husband died of lung cancer.     Right-handed.   She lives at home with her friend, [name] and her son.   2-4 bottles of Coke per day.           Family History  Problem Relation Age of Onset  . Heart disease Mother   . Lung cancer Mother   . Cancer Mother        Lung  . Heart disease Father   . Lung cancer Father   . Cancer Father        Lung  . Heart disease Sister        CABG- Open Heart     ROS:  Please see the history of present illness.     All other systems reviewed and negative.    Physical Exam: Blood pressure 138/68, pulse 86, height 5' (1.524 m), weight 150 lb (68 kg), SpO2 97 %. General: Well developed, well nourished female in no acute distress  Head: Normocephalic, atraumatic, sclera non-icteric, no xanthomas, nares are without discharge. EENT: normal  Lymph Nodes:  none Neck: Bilateral  carotid bruits. JVD not elevated. Back:without scoliosis kyphosis  Lungs: Clear bilaterally to auscultation without wheezes, breath sounds decreased  heart: RRR with S1 S2. No  murmur . No rubs, or gallops appreciated. Abdomen: Soft, non-tender, non-distended with normoactive bowel sounds. No hepatomegaly. No rebound/guarding. No obvious abdominal masses. Msk:  Strength and tone appear normal for age. Extremities: No clubbing or cyanosis. No  edema.  Distal pedal pulses are 2+ and equal bilaterally.  Right foot is in a boot Skin: Warm and Dry Neuro: Alert and oriented X 3. CN III-XII intact Grossly normal sensory and motor function . Psych:  Responds to questions appropriately with a normal affect.      Labs: Cardiac Enzymes No results for input(s): CKTOTAL, CKMB,  TROPONINI in the last 72 hours. CBC Lab Results  Component Value Date   WBC 11.3 (H) 10/11/2018   HGB 10.8 (L) 10/11/2018   HCT 34.4 (L) 10/11/2018   MCV 87.5 10/11/2018   PLT 260  10/11/2018   PROTIME: No results for input(s): LABPROT, INR in the last 72 hours. Chemistry No results for input(s): NA, K, CL, CO2, BUN, CREATININE, CALCIUM, PROT, BILITOT, ALKPHOS, ALT, AST, GLUCOSE in the last 168 hours.  Invalid input(s): LABALBU Lipids Lab Results  Component Value Date   CHOL 200 07/21/2018   HDL 50.80 07/21/2018   LDLCALC 64 03/17/2018   TRIG 322.0 (H) 07/21/2018   BNP Pro B Natriuretic peptide (BNP)  Date/Time Value Ref Range Status  09/21/2012 01:30 PM 330.8 (H) 0 - 125 pg/mL Final  05/09/2011 11:38 PM 39.2 0 - 125 pg/mL Final   Thyroid Function Tests: No results for input(s): TSH, T4TOTAL, T3FREE, THYROIDAB in the last 72 hours.  Invalid input(s): FREET3 Miscellaneous Lab Results  Component Value Date   DDIMER 1.95 (H) 04/03/2018    Radiology/Studies:  No results found.  EKG: sinus @ 86 18/10/36 IRBBB RAD   Assessment and Plan:  Atrial flutter-typical  TIA/CVA  HTN  DM  Carotid Vascular disease  COPD   FAlls  The patient has a high CHADS-VASc score and a prior stroke.  Indefinite anticoagulation is indicated  The patient has an isolated episode of typical atrial flutter.  I do not find documentation of atrial fibrillation.  Hence, the indication for ablation of atrial flutter is limited to the symptoms associated with recurrence.  I have reviewed this extensively with her; she would like to proceed with catheter ablation aware of a 1 1000 risk of significant untoward events.  It would be done with general anesthesia.  She has significant pulmonary disease so postoperative care will be important  Her falls are thought to be that and not syncope.      Virl Axe

## 2018-11-23 NOTE — H&P (View-Only) (Signed)
ELECTROPHYSIOLOGY CONSULT NOTE  Patient ID: April Branch, MRN: [id number], DOB/AGE: 09/21/1945 73 y.o. Admit date: (Not on file) Date of Consult: 11/23/2018  Primary Physician: [name], MD Primary Cardiologist: Raynaldo Opitz     April Branch is a 73 y.o. female who is being seen today for the evaluation of AFlutter at the request of Pni.    HPI April Branch is a 73 y.o. female referred for consideration of ablation for atrial flutter.  She has a history of recurrent falls.  1 of these resulted in an emergency room visit wherein she was demonstrated to be in typical atrial flutter.  She was discharged from hospital on rivaroxaban and referred  She has COPD and chronic dyspnea.  No known coronary disease, remote catheterization demonstrating nonobstructive lesions (2016) prior TIA.  Peripheral vascular disease, diabetes.  Thromboembolic risk factors ( age -55, HTN-1, TIA/CVA-2, DM-1, Vasc disease -1, Gender-1) for a CHADSVASc Score of >=7  Echocardiogram 8/20 demonstrated normal LV function normal left atrial size  Past Medical History:  Diagnosis Date  . Anemia   . Anxiety   . Arthritis   . CAD (coronary artery disease) 2016   non-obstructive at cath  . Colon polyps 09/11/2010   Tubular adenoma  . COPD 12/14/2008   PATIENT DENIES    . Depression   . Diabetic coma ([place]) Feb. 2014  . Fall at home 2016   x 2 in January 2016, Left knee  . Fibromyalgia   . GERD 07/20/2006  . Headache(784.0)   . History of transient ischemic attack (TIA)   . HPV (human papilloma virus) infection   . Hyperlipidemia   . Hypertension   . Hypothyroidism   . 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 Lumbar disc disease   . Neuromuscular disorder ([place])    PERIPHERAL NEUROPATHY  . Peripheral vascular disease ([place])   . Personal history of malignant neoplasm of kidney(V10.52) 12/14/2008   Laparoscopic biopsy and cryoablation 7/08 Dr. Vernie Shanks   .  Renal disorder    chronic kidney disease   . Stroke [facility])    3 MINI STROKES  RT SIDED WEAKNESS  . Vertigo       Surgical History:  Past Surgical History:  Procedure Laterality Date  . ABDOMINAL AORTAGRAM N/A 08/21/2011   Procedure: ABDOMINAL Maxcine Ham;  Surgeon: Elam Dutch, MD;  Location: [facility] CATH LAB;  Service: Cardiovascular;  Laterality: N/A;  . ABDOMINAL AORTAGRAM N/A 03/16/2014   Procedure: ABDOMINAL Maxcine Ham;  Surgeon: Elam Dutch, MD;  Location: [facility] CATH LAB;  Service: Cardiovascular;  Laterality: N/A;  . ABDOMINAL HYSTERECTOMY    . APPENDECTOMY    . BLADDER SUSPENSION    . CARDIAC CATHETERIZATION    . CHOLECYSTECTOMY OPEN    . ESOPHAGOGASTRODUODENOSCOPY (EGD) WITH PROPOFOL N/A 05/16/2014   Procedure: ESOPHAGOGASTRODUODENOSCOPY (EGD) WITH PROPOFOL with Balloon dilation;  Surgeon: Milus Banister, MD;  Location: [place];  Service: Endoscopy;  Laterality: N/A;  . ESOPHAGOGASTRODUODENOSCOPY (EGD) WITH PROPOFOL N/A 09/02/2015   Procedure: ESOPHAGOGASTRODUODENOSCOPY (EGD) WITH PROPOFOL;  Surgeon: Doran Stabler, MD;  Location: [place];  Service: Gastroenterology;  Laterality: N/A;  . FEMORAL-POPLITEAL BYPASS GRAFT  10/12/2011   Procedure: BYPASS GRAFT FEMORAL-POPLITEAL ARTERY;  Surgeon: Elam Dutch, MD;  Location: [facility] OR;  Service: Vascular;  Laterality: Left;  Left Femoral-Popliteal Bypass Graft using 40mm x 80cm Propaten Graft with intraop arteriogram times one.  . FEMORAL-POPLITEAL BYPASS GRAFT Left 04/17/2014  Procedure: REDO LEFT FEMORAL-POPLITEAL ARTERY BYPASS USING GORE PROPATEN 9mmx80cm GRAFT;  Surgeon: Elam Dutch, MD;  Location: [place];  Service: Vascular;  Laterality: Left;  . FOOT SURGERY Left    "bone spurs"  . INTRAOPERATIVE ARTERIOGRAM  10/12/2011   Procedure: INTRA OPERATIVE ARTERIOGRAM;  Surgeon: Elam Dutch, MD;  Location: [place];  Service: Vascular;  Laterality: Left;  . KNEE ARTHROSCOPY Bilateral    "cartilage"  . Laparascopic  cryoablation of left kidney  08/2006   Dr. Gladis Riffle for renal cell cancer  . LEFT HEART CATHETERIZATION WITH CORONARY ANGIOGRAM N/A 05/11/2011   Procedure: LEFT HEART CATHETERIZATION WITH CORONARY ANGIOGRAM;  Surgeon: Jolaine Artist, MD;  Location: [facility] CATH LAB;  Service: Cardiovascular;  Laterality: N/A;  . LUNG SURGERY Right    lung nodule removed from the right side  . PR VEIN BYPASS GRAFT,AORTO-FEM-POP  05/27/2010  . SHOULDER ARTHROSCOPY Bilateral    'Spurs"  . TUBAL LIGATION       Home Meds: Current Meds  Medication Sig  . ACCU-CHEK FASTCLIX LANCETS MISC Use 4 per day to check blood sugar dx code E11.9  . amLODipine (NORVASC) 10 MG tablet Take 10 mg by mouth daily.  . cilostazol (PLETAL) 100 MG tablet TAKE 1 TABLET BY MOUTH TWICE A DAY  . DULoxetine (CYMBALTA) 60 MG capsule TAKE 1 CAPSULE BY MOUTH ONCE DAILY  . ferrous sulfate 325 (65 FE) MG tablet Take 325 mg by mouth 2 (two) times daily.   . furosemide (LASIX) 20 MG tablet Take 1 tablet (20 mg total) by mouth every other day.  Marland Kitchen glucose blood (CONTOUR NEXT TEST) test strip Use to test blood sugar 3 times daily E11.9  . HYDROcodone-acetaminophen (NORCO) 10-325 MG tablet Take 1 tablet by mouth 3 (three) times daily as needed (for pain). Follow up with PCP if refills needed  . insulin lispro (HUMALOG) 100 UNIT/ML injection Inject 0.7 mLs (70 Units total) into the skin See admin instructions. Use 200 units every 3 days in insulin pump  . Insulin Pen Needle (BD PEN NEEDLE NANO U/F) 32G X 4 MM MISC Use to inject insulin  . lansoprazole (PREVACID) 30 MG capsule Take 1 capsule (30 mg total) by mouth daily.  Marland Kitchen levothyroxine (SYNTHROID) 125 MCG tablet Take 125 mcg by mouth daily before breakfast.  . metoprolol succinate (TOPROL-XL) 50 MG 24 hr tablet Take 1.5 tablets (75 mg total) once a day  . ondansetron (ZOFRAN) 4 MG tablet Take 4 mg by mouth every 8 (eight) hours as needed.   . potassium chloride (K-DUR) 10 MEQ tablet Take 1 tablet (10  meq) twice a day, every other day  . Rivaroxaban (XARELTO) 15 MG TABS tablet Take 1 tablet (15 mg total) by mouth daily with supper.  . rosuvastatin (CRESTOR) 20 MG tablet Take 20 mg by mouth daily.  Marland Kitchen tolterodine (DETROL LA) 4 MG 24 hr capsule Take 4 mg by mouth daily.   Marland Kitchen triamcinolone cream (KENALOG) 0.1 % Apply 1 application topically 2 (two) times daily.  . Vitamin D, Ergocalciferol, (DRISDOL) 1.25 MG (50000 UT) CAPS capsule     Allergies: No Known Allergies  Social History   Socioeconomic History  . Marital status: Widowed    Spouse name: Not on file  . Number of children: 2  . Years of education: 8th grade  . Highest education level: Not on file  Occupational History  . 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 Emergency planning/management officer  strain: Not on file  . Food insecurity    Worry: Not on file    Inability: Not on file  . Transportation needs    Medical: Not on file    Non-medical: Not on file  Tobacco Use  . Smoking status: Current Every Day Smoker    Packs/day: 1.50    Years: 50.00    Pack years: 75.00    Types: Cigarettes    Last attempt to quit: 06/24/2014    Years since quitting: 4.4  . Smokeless tobacco: Never Used  . Tobacco comment: 08/15/2018 5 cigs a day  Substance and Sexual Activity  . Alcohol use: No    Alcohol/week: 0.0 standard drinks  . Drug use: No  . Sexual activity: Never  Lifestyle  . Physical activity    Days per week: Not on file    Minutes per session: Not on file  . Stress: Not on file  Relationships  . Social Herbalist on phone: Not on file    Gets together: Not on file    Attends religious service: Not on file    Active member of club or organization: Not on file    Attends meetings of clubs or organizations: Not on file    Relationship status: Not on file  . Intimate partner violence    Fear of current or ex partner: Not on file    Emotionally abused: Not on file    Physically abused: Not on file     Forced sexual activity: Not on file  Other Topics Concern  . Not on file  Social History Narrative   Widow. Two sons. Husband died of lung cancer.     Right-handed.   She lives at home with her friend, [name] and her son.   2-4 bottles of Coke per day.           Family History  Problem Relation Age of Onset  . Heart disease Mother   . Lung cancer Mother   . Cancer Mother        Lung  . Heart disease Father   . Lung cancer Father   . Cancer Father        Lung  . Heart disease Sister        CABG- Open Heart     ROS:  Please see the history of present illness.     All other systems reviewed and negative.    Physical Exam: Blood pressure 138/68, pulse 86, height 5' (1.524 m), weight 150 lb (68 kg), SpO2 97 %. General: Well developed, well nourished female in no acute distress  Head: Normocephalic, atraumatic, sclera non-icteric, no xanthomas, nares are without discharge. EENT: normal  Lymph Nodes:  none Neck: Bilateral  carotid bruits. JVD not elevated. Back:without scoliosis kyphosis  Lungs: Clear bilaterally to auscultation without wheezes, breath sounds decreased  heart: RRR with S1 S2. No  murmur . No rubs, or gallops appreciated. Abdomen: Soft, non-tender, non-distended with normoactive bowel sounds. No hepatomegaly. No rebound/guarding. No obvious abdominal masses. Msk:  Strength and tone appear normal for age. Extremities: No clubbing or cyanosis. No  edema.  Distal pedal pulses are 2+ and equal bilaterally.  Right foot is in a boot Skin: Warm and Dry Neuro: Alert and oriented X 3. CN III-XII intact Grossly normal sensory and motor function . Psych:  Responds to questions appropriately with a normal affect.      Labs: Cardiac Enzymes No results for input(s): CKTOTAL, CKMB,  TROPONINI in the last 72 hours. CBC Lab Results  Component Value Date   WBC 11.3 (H) 10/11/2018   HGB 10.8 (L) 10/11/2018   HCT 34.4 (L) 10/11/2018   MCV 87.5 10/11/2018   PLT 260  10/11/2018   PROTIME: No results for input(s): LABPROT, INR in the last 72 hours. Chemistry No results for input(s): NA, K, CL, CO2, BUN, CREATININE, CALCIUM, PROT, BILITOT, ALKPHOS, ALT, AST, GLUCOSE in the last 168 hours.  Invalid input(s): LABALBU Lipids Lab Results  Component Value Date   CHOL 200 07/21/2018   HDL 50.80 07/21/2018   LDLCALC 64 03/17/2018   TRIG 322.0 (H) 07/21/2018   BNP Pro B Natriuretic peptide (BNP)  Date/Time Value Ref Range Status  09/21/2012 01:30 PM 330.8 (H) 0 - 125 pg/mL Final  05/09/2011 11:38 PM 39.2 0 - 125 pg/mL Final   Thyroid Function Tests: No results for input(s): TSH, T4TOTAL, T3FREE, THYROIDAB in the last 72 hours.  Invalid input(s): FREET3 Miscellaneous Lab Results  Component Value Date   DDIMER 1.95 (H) 04/03/2018    Radiology/Studies:  No results found.  EKG: sinus @ 86 18/10/36 IRBBB RAD   Assessment and Plan:  Atrial flutter-typical  TIA/CVA  HTN  DM  Carotid Vascular disease  COPD   FAlls  The patient has a high CHADS-VASc score and a prior stroke.  Indefinite anticoagulation is indicated  The patient has an isolated episode of typical atrial flutter.  I do not find documentation of atrial fibrillation.  Hence, the indication for ablation of atrial flutter is limited to the symptoms associated with recurrence.  I have reviewed this extensively with her; she would like to proceed with catheter ablation aware of a 1 1000 risk of significant untoward events.  It would be done with general anesthesia.  She has significant pulmonary disease so postoperative care will be important  Her falls are thought to be that and not syncope.      Virl Axe

## 2018-11-23 NOTE — Patient Instructions (Addendum)
Medication Instructions:  Your physician recommends that you continue on your current medications as directed. Please refer to the Current Medication list given to you today.  * If you need a refill on your cardiac medications before your next appointment, please call your pharmacy.   Labwork: [facility] get  CBC next month.  Written prescription given to you today for you to have done on 12/07/18 at your diabetic lab appointment.  Testing/Procedures: Your physician has recommended that you have an ablation. Catheter ablation is a medical procedure used to treat some cardiac arrhythmias (irregular heartbeats). During catheter ablation, a long, thin, flexible tube is put into a blood vessel in your groin (upper thigh), or neck. This tube is called an ablation catheter. It is then guided to your heart through the blood vessel. Radio frequency waves destroy small areas of heart tissue where abnormal heartbeats may cause an arrhythmia to start. Please see the instructions below.  Follow-Up: You are scheduled for post procedure follow up with Dr. Caryl Comes on 01/24/19 @ 1:45 pm   Thank you for choosing [facility]!!    Any Other Special Instructions Will Be Listed Below (If Applicable).    Electrophysiology/Ablation Procedure Instructions   You are scheduled for a(n)  ablation on 12/21/18 with Dr. Barnetta Hammersmith.   1.   Pre procedure testing-             COVID TEST-- On 12/17/18 @ 12:30 pm- You will go to [facility] ([place]) for your Covid testing.   This is a drive thru test site.  There will be multiple testing areas.  Be sure to share with the first checkpoint that you are there for pre-procedure/surgery testing. This will put you into the right (yellow) lane that leads to the PAT testing team. Stay in your car and the nurse team will come to your car to test you.  After you are tested please go home and self quarantine until the day of your procedure.     2. On the  day of your procedure 12/21/18 you will go to [facility] [phone]) at 8:30 a.m..  You will go to the main entrance A [employer]) and enter where the [employer] are.  Your driver will drop you off and you will head down the hallway to ADMITTING.  You may have one support person come in to the hospital with you.  They will be asked to wait in the waiting room.   3.   Do not eat or drink after midnight prior to your procedure.   4.   Do NOT take any medications the morning of your procedure.   5.  Plan for an overnight stay.  If you use your phone frequently bring your phone charger.   * If you have ANY questions please call the office [phone] or send me a MyChart message   * Occasionally, EP Studies and ablations can become lengthy.  Please make your family aware of this before your procedure starts.  Average time ranges from 2-8 hours for EP studies/ablations.  Your physician will call your family after the procedure with the results.

## 2018-11-24 LAB — BASIC METABOLIC PANEL
BUN/Creatinine Ratio: 11 — ABNORMAL LOW (ref 12–28)
BUN: 16 mg/dL (ref 8–27)
CO2: 21 mmol/L (ref 20–29)
Calcium: 9.1 mg/dL (ref 8.7–10.3)
Chloride: 104 mmol/L (ref 96–106)
Creatinine, Ser: 1.42 mg/dL — ABNORMAL HIGH (ref 0.57–1.00)
GFR calc Af Amer: 43 mL/min/{1.73_m2} — ABNORMAL LOW (ref >59)
GFR calc non Af Amer: 37 mL/min/{1.73_m2} — ABNORMAL LOW (ref >59)
Glucose: 59 mg/dL — ABNORMAL LOW (ref 65–99)
Potassium: 3.6 mmol/L (ref 3.5–5.2)
Sodium: 142 mmol/L (ref 134–144)

## 2018-12-06 ENCOUNTER — Other Ambulatory Visit: Payer: Self-pay

## 2018-12-06 MED ORDER — DULOXETINE HCL 60 MG PO CPEP: 60.0000 mg | ORAL_CAPSULE | Freq: Every day | ORAL | 3 refills | Status: AC

## 2018-12-07 ENCOUNTER — Other Ambulatory Visit: Payer: Self-pay | Admitting: Endocrinology

## 2018-12-07 ENCOUNTER — Other Ambulatory Visit (INDEPENDENT_AMBULATORY_CARE_PROVIDER_SITE_OTHER): Payer: Medicare Other

## 2018-12-07 DIAGNOSIS — Z794 Long term (current) use of insulin: Secondary | ICD-10-CM

## 2018-12-07 DIAGNOSIS — E1165 Type 2 diabetes mellitus with hyperglycemia: Secondary | ICD-10-CM | POA: Diagnosis not present

## 2018-12-07 DIAGNOSIS — E038 Other specified hypothyroidism: Secondary | ICD-10-CM | POA: Diagnosis not present

## 2018-12-07 DIAGNOSIS — E1142 Type 2 diabetes mellitus with diabetic polyneuropathy: Secondary | ICD-10-CM

## 2018-12-07 LAB — BASIC METABOLIC PANEL
BUN: 15 mg/dL (ref 6–23)
CO2: 26 mEq/L (ref 19–32)
Calcium: 9.1 mg/dL (ref 8.4–10.5)
Chloride: 108 mEq/L (ref 96–112)
Creatinine, Ser: 1.39 mg/dL — ABNORMAL HIGH (ref 0.40–1.20)
GFR: 37.34 mL/min — ABNORMAL LOW (ref >60.00)
Glucose, Bld: 62 mg/dL — ABNORMAL LOW (ref 70–99)
Potassium: 3.3 mEq/L — ABNORMAL LOW (ref 3.5–5.1)
Sodium: 144 mEq/L (ref 135–145)

## 2018-12-07 LAB — CBC
HCT: 34.1 % — ABNORMAL LOW (ref 36.0–46.0)
Hemoglobin: 10.9 g/dL — ABNORMAL LOW (ref 12.0–15.0)
MCHC: 31.8 g/dL (ref 30.0–36.0)
MCV: 84.5 fl (ref 78.0–100.0)
Platelets: 364 10*3/uL (ref 150.0–400.0)
RBC: 4.04 Mil/uL (ref 3.87–5.11)
RDW: 16.9 % — ABNORMAL HIGH (ref 11.5–15.5)
WBC: 10.5 10*3/uL (ref 4.0–10.5)

## 2018-12-07 LAB — TSH: TSH: 0.11 u[IU]/mL — ABNORMAL LOW (ref 0.35–4.50)

## 2018-12-07 LAB — MICROALBUMIN / CREATININE URINE RATIO
Creatinine,U: 62.3 mg/dL
Microalb Creat Ratio: 3 mg/g (ref 0.0–30.0)
Microalb, Ur: 1.9 mg/dL (ref 0.0–1.9)

## 2018-12-07 LAB — T4, FREE: Free T4: 1.39 ng/dL (ref 0.60–1.60)

## 2018-12-08 LAB — FRUCTOSAMINE: Fructosamine: 205 umol/L (ref 0–285)

## 2018-12-09 ENCOUNTER — Encounter: Payer: Self-pay | Admitting: Endocrinology

## 2018-12-09 ENCOUNTER — Telehealth: Payer: Self-pay

## 2018-12-09 ENCOUNTER — Ambulatory Visit (INDEPENDENT_AMBULATORY_CARE_PROVIDER_SITE_OTHER): Payer: Medicare Other | Admitting: Endocrinology

## 2018-12-09 ENCOUNTER — Other Ambulatory Visit: Payer: Self-pay

## 2018-12-09 VITALS — BP 136/60 | HR 63 | Ht 60.0 in | Wt 162.0 lb

## 2018-12-09 DIAGNOSIS — E1165 Type 2 diabetes mellitus with hyperglycemia: Secondary | ICD-10-CM | POA: Diagnosis not present

## 2018-12-09 DIAGNOSIS — I951 Orthostatic hypotension: Secondary | ICD-10-CM | POA: Diagnosis not present

## 2018-12-09 DIAGNOSIS — E038 Other specified hypothyroidism: Secondary | ICD-10-CM | POA: Diagnosis not present

## 2018-12-09 DIAGNOSIS — E1142 Type 2 diabetes mellitus with diabetic polyneuropathy: Secondary | ICD-10-CM | POA: Diagnosis not present

## 2018-12-09 DIAGNOSIS — Z794 Long term (current) use of insulin: Secondary | ICD-10-CM

## 2018-12-09 MED ORDER — LEVOTHYROXINE SODIUM 137 MCG PO TABS: 137.0000 ug | ORAL_TABLET | Freq: Every day | ORAL | 1 refills | Status: AC

## 2018-12-09 NOTE — Progress Notes (Signed)
Patient ID: April Branch, female   DOB: [birth year], 73 y.o.   MRN: [id number]           Reason for Appointment: Follow-up  for Type 2 Diabetes  Referring physician: Pattricia Boss  History of Present Illness:          Date of diagnosis of type 2 diabetes mellitus: 1980s       Previous history:    She thinks she was treated with metformin for some time before she went on insulin in ?  2002   She has mostly been treated with Lantus and rapid acting insulin such as Apidra or Novolog.   She thinks that Apidra worked fairly fast but insurance did not cover this, generally has had poor control   On her initial consultation she was switched from Lantus to [employer] because of blood sugars mostly over 300  Was also started on metformin ER 750 mg, 2 tablets daily initially and  in 3/17 she was started on Invokana 100 mg daily because of  poor control  Recent history:   INSULIN regimen: OMNIPOD pump using Humalog U-100 insulin  Current settings: Basal rate: Midnight = 1.2, 8 a.m. = 1.7, 11 AM = 2 units/h 3 PM = 2.4, 10 PM = 1.6 Bolus presets 4 units breakfast, 4 units lunch and 4-6 at dinnertime  Recent basal daily dose 43 units Correction factor I: 60, target 125   Current blood sugar patterns and problems identified:  Her last A1c was 7.2   Even with her lowering her basal rates on the last visit she has a tendency to hypoglycemia at various times frequently in the mornings around breakfast time  Her total daily basal rate was reduced by about 5 units  However she did not call to report low blood sugars and also has had a couple of low sugars late at night  She forgets to turn her blood sugars in the pump  Only rarely when the blood sugars are high she will do a bolus in her pump  Most of meals are not covered  Also not clear if she tends to have consistently high readings after meals  Although she thinks she is eating less she will sometimes have full meals in the evening  She  does not know how to program her basal rates  She has gained 3 pounds  She still treats her low blood sugars with cookies instead of simple sugars frequently  Not getting any steroids recently  Previously was on Invokana but this was stopped because of renal dysfunction times and low blood pressure  Oral hypoglycemic drugs the patient is taking are: None  Compliance with the medical regimen: Inconsistent  Glucose monitoring:  done 2-3 times a day         Glucometer:  Accu-Chek     Blood Glucose readings by download and monitor:   PRE-MEAL Fasting Lunch Dinner Bedtime Overall  Glucose range:  44-291  53-197  60-303  58-379   Mean/median:  150  121  143  152  134+/-90   Previous readings:  PRE-MEAL Fasting Lunch Dinner Bedtime Overall  Glucose range:  54-112  53-167  73-273   46-322  Mean/median:      129   POST-MEAL PC Breakfast PC Lunch PC Dinner  Glucose range:    46- 322  Mean/median:        Self-care:    Typical meal intake: Breakfast is variable at 11 AM, frequently no lunch, dinner 7  pm              Dietician visit, most recent: never               Exercise: Unable to do much physical activity  Weight history:  Highest previous weight 199   Wt Readings from Last 3 Encounters:  12/09/18 162 lb (73.5 kg)  11/23/18 150 lb (68 kg)  11/07/18 159 lb 12.8 oz (72.5 kg)    Glycemic control:   Lab Results  Component Value Date   HGBA1C 7.2 (A) 11/07/2018   HGBA1C 9.9 (A) 07/21/2018   HGBA1C 7.9 (H) 04/03/2018   Lab Results  Component Value Date   MICROALBUR 1.9 12/07/2018   LDLCALC 64 03/17/2018   CREATININE 1.39 (H) 12/07/2018    Lab on 12/07/2018  Component Date Value Ref Range Status  . WBC 12/07/2018 10.5  4.0 - 10.5 K/uL Final  . RBC 12/07/2018 4.04  3.87 - 5.11 Mil/uL Final  . Platelets 12/07/2018 364.0  150.0 - 400.0 K/uL Final  . Hemoglobin 12/07/2018 10.9* 12.0 - 15.0 g/dL Final  . HCT 12/07/2018 34.1* 36.0 - 46.0 % Final  . MCV 12/07/2018  84.5  78.0 - 100.0 fl Final  . MCHC 12/07/2018 31.8  30.0 - 36.0 g/dL Final  . RDW 12/07/2018 16.9* 11.5 - 15.5 % Final  . Free T4 12/07/2018 1.39  0.60 - 1.60 ng/dL Final   Comment: Specimens from patients who are undergoing biotin therapy and /or ingesting biotin supplements may contain high levels of biotin.  The higher biotin concentration in these specimens interferes with this Free T4 assay.  Specimens that contain high levels  of biotin may cause false high results for this Free T4 assay.  Please interpret results in light of the total clinical presentation of the patient.    Marland Kitchen TSH 12/07/2018 0.11* 0.35 - 4.50 uIU/mL Final  . Fructosamine 12/07/2018 205  0 - 285 umol/L Final   Comment: Published reference interval for apparently healthy subjects between age 6 and 52 is 35 - 285 umol/L and in a poorly controlled diabetic population is 228 - 563 umol/L with a mean of 396 umol/L.   Marland Kitchen Sodium 12/07/2018 144  135 - 145 mEq/L Final  . Potassium 12/07/2018 3.3* 3.5 - 5.1 mEq/L Final  . Chloride 12/07/2018 108  96 - 112 mEq/L Final  . CO2 12/07/2018 26  19 - 32 mEq/L Final  . Glucose, Bld 12/07/2018 62* 70 - 99 mg/dL Final  . BUN 12/07/2018 15  6 - 23 mg/dL Final  . Creatinine, Ser 12/07/2018 1.39* 0.40 - 1.20 mg/dL Final  . Calcium 12/07/2018 9.1  8.4 - 10.5 mg/dL Final  . GFR 12/07/2018 37.34* >60.00 mL/min Final  . Microalb, Ur 12/07/2018 1.9  0.0 - 1.9 mg/dL Final  . Creatinine,U 12/07/2018 62.3  mg/dL Final  . Microalb Creat Ratio 12/07/2018 3.0  0.0 - 30.0 mg/g Final    OTHER active problems: See review of systems    Allergies as of 12/09/2018   No Known Allergies     Medication List       Accurate as of December 09, 2018 12:59 PM. If you have any questions, ask your nurse or doctor.        STOP taking these medications   levothyroxine 125 MCG tablet Commonly known as: SYNTHROID Stopped by: Elayne Snare, MD     TAKE these medications   Accu-Chek FastClix Lancets  Misc Use 4 per day to check blood  sugar dx code E11.9   amLODipine 10 MG tablet Commonly known as: NORVASC Take 10 mg by mouth daily.   cilostazol 100 MG tablet Commonly known as: PLETAL TAKE 1 TABLET BY MOUTH TWICE A DAY   Contour Next Test test strip Generic drug: glucose blood Use to test blood sugar 3 times daily E11.9   DULoxetine 60 MG capsule Commonly known as: CYMBALTA Take 1 capsule (60 mg total) by mouth daily.   ferrous sulfate 325 (65 FE) MG tablet Take 325 mg by mouth 2 (two) times daily.   furosemide 20 MG tablet Commonly known as: LASIX Take 1 tablet (20 mg total) by mouth every other day. What changed: when to take this   HYDROcodone-acetaminophen 10-325 MG tablet Commonly known as: NORCO Take 1 tablet by mouth 3 (three) times daily as needed (for pain). Follow up with PCP if refills needed   insulin lispro 100 UNIT/ML injection Commonly known as: HumaLOG Inject 0.7 mLs (70 Units total) into the skin See admin instructions. Use 200 units every 3 days in insulin pump   Insulin Pen Needle 32G X 4 MM Misc Commonly known as: BD Pen Needle Nano U/F Use to inject insulin   lansoprazole 30 MG capsule Commonly known as: PREVACID Take 1 capsule (30 mg total) by mouth daily.   metoprolol succinate 50 MG 24 hr tablet Commonly known as: TOPROL-XL Take 1.5 tablets (75 mg total) once a day   ondansetron 4 MG tablet Commonly known as: ZOFRAN Take 4 mg by mouth every 8 (eight) hours as needed.   potassium chloride 10 MEQ tablet Commonly known as: KLOR-CON Take 1 tablet (10 meq) twice a day, every other day   Rivaroxaban 15 MG Tabs tablet Commonly known as: XARELTO Take 1 tablet (15 mg total) by mouth daily with supper.   rosuvastatin 20 MG tablet Commonly known as: CRESTOR Take 20 mg by mouth daily.   tolterodine 4 MG 24 hr capsule Commonly known as: DETROL LA Take 4 mg by mouth daily.   triamcinolone cream 0.1 % Commonly known as: KENALOG Apply 1  application topically 2 (two) times daily.   Vitamin D (Ergocalciferol) 1.25 MG (50000 UT) Caps capsule Commonly known as: DRISDOL       Allergies: No Known Allergies  Past Medical History:  Diagnosis Date  . Anemia   . Anxiety   . Arthritis   . CAD (coronary artery disease) 2016   non-obstructive at cath  . Colon polyps 09/11/2010   Tubular adenoma  . COPD 12/14/2008   PATIENT DENIES    . Depression   . Diabetic coma ([place]) Feb. 2014  . Fall at home 2016   x 2 in January 2016, Left knee  . Fibromyalgia   . GERD 07/20/2006  . Headache(784.0)   . History of transient ischemic attack (TIA)   . HPV (human papilloma virus) infection   . Hyperlipidemia   . Hypertension   . Hypothyroidism   . Insulin dependent type 2 diabetes mellitus, controlled ([place]) 1989  . Lumbar disc disease   . Neuromuscular disorder ([place])    PERIPHERAL NEUROPATHY  . Peripheral vascular disease ([place])   . Personal history of malignant neoplasm of kidney(V10.52) 12/14/2008   Laparoscopic biopsy and cryoablation 7/08 Dr. Vernie Shanks   . Renal disorder    chronic kidney disease   . Stroke [facility])    3 MINI STROKES  RT SIDED WEAKNESS  . Vertigo     Past Surgical History:  Procedure Laterality  Date  . ABDOMINAL AORTAGRAM N/A 08/21/2011   Procedure: ABDOMINAL Maxcine Ham;  Surgeon: Elam Dutch, MD;  Location: [facility] CATH LAB;  Service: Cardiovascular;  Laterality: N/A;  . ABDOMINAL AORTAGRAM N/A 03/16/2014   Procedure: ABDOMINAL Maxcine Ham;  Surgeon: Elam Dutch, MD;  Location[facility] CATH LAB;  Service: Cardiovascular;  Laterality: N/A;  . ABDOMINAL HYSTERECTOMY    . APPENDECTOMY    . BLADDER SUSPENSION    . CARDIAC CATHETERIZATION    . CHOLECYSTECTOMY OPEN    . ESOPHAGOGASTRODUODENOSCOPY (EGD) WITH PROPOFOL N/A 05/16/2014   Procedure: ESOPHAGOGASTRODUODENOSCOPY (EGD) WITH PROPOFOL with Balloon dilation;  Surgeon: Milus Banister, MD;  Location: [place];  Service: Endoscopy;  Laterality: N/A;  .  ESOPHAGOGASTRODUODENOSCOPY (EGD) WITH PROPOFOL N/A 09/02/2015   Procedure: ESOPHAGOGASTRODUODENOSCOPY (EGD) WITH PROPOFOL;  Surgeon: Doran Stabler, MD;  Location: [place];  Service: Gastroenterology;  Laterality: N/A;  . FEMORAL-POPLITEAL BYPASS GRAFT  10/12/2011   Procedure: BYPASS GRAFT FEMORAL-POPLITEAL ARTERY;  Surgeon: Elam Dutch, MD;  Location: [facility] OR;  Service: Vascular;  Laterality: Left;  Left Femoral-Popliteal Bypass Graft using 84mm x 80cm Propaten Graft with intraop arteriogram times one.  . FEMORAL-POPLITEAL BYPASS GRAFT Left 04/17/2014   Procedure: REDO LEFT FEMORAL-POPLITEAL ARTERY BYPASS USING GORE PROPATEN 44mmx80cm GRAFT;  Surgeon: Elam Dutch, MD;  Location: [place];  Service: Vascular;  Laterality: Left;  . FOOT SURGERY Left    "bone spurs"  . INTRAOPERATIVE ARTERIOGRAM  10/12/2011   Procedure: INTRA OPERATIVE ARTERIOGRAM;  Surgeon: Elam Dutch, MD;  Location: [place];  Service: Vascular;  Laterality: Left;  . KNEE ARTHROSCOPY Bilateral    "cartilage"  . Laparascopic cryoablation of left kidney  08/2006   Dr. Gladis Riffle for renal cell cancer  . LEFT HEART CATHETERIZATION WITH CORONARY ANGIOGRAM N/A 05/11/2011   Procedure: LEFT HEART CATHETERIZATION WITH CORONARY ANGIOGRAM;  Surgeon: Jolaine Artist, MD;  Location: [facility] CATH LAB;  Service: Cardiovascular;  Laterality: N/A;  . LUNG SURGERY Right    lung nodule removed from the right side  . PR VEIN BYPASS GRAFT,AORTO-FEM-POP  05/27/2010  . SHOULDER ARTHROSCOPY Bilateral    'Spurs"  . TUBAL LIGATION      Family History  Problem Relation Age of Onset  . Heart disease Mother   . Lung cancer Mother   . Cancer Mother        Lung  . Heart disease Father   . Lung cancer Father   . Cancer Father        Lung  . Heart disease Sister        CABG- Open Heart    Social History:  reports that she has been smoking cigarettes. She has a 75.00 pack-year smoking history. She has never used smokeless tobacco. She reports  that she does not drink alcohol or use drugs.    Review of Systems    DEPRESSION: She is treated with Cymbalta along with nortriptyline and nortriptyline was prescribed by PCP Also takes this for neuropathy  Lipid history:  She has good control of LDL with taking pravastatin 20 mg but has high triglycerides    Lab Results  Component Value Date   CHOL 200 07/21/2018   HDL 50.80 07/21/2018   LDLCALC 64 03/17/2018   LDLDIRECT 99.0 07/21/2018   TRIG 322.0 (H) 07/21/2018   CHOLHDL 4 07/21/2018            Neurological:    Has no numbness,but does have pains , burning and tingling in feet.  Symptoms had improved with  Cymbalta  60 mg in addition to gabapentin  She is taking gabapentin 300 mg again although she was told to stop this when she had encephalopathy in the hospital, otherwise has significant pain  She is also being treated with nortriptyline by PCP  She has had  peripheral vascular disease and treated with Pletal from vascular surgeon  Long history of thyroid disease, hypothyroidism has been treated with 150 micrograms   TSH now is decreased below normal  Lab Results  Component Value Date   TSH 0.11 (L) 12/07/2018   TSH 0.40 09/05/2018   TSH 4.59 (H) 07/21/2018   FREET4 1.39 12/07/2018   FREET4 0.92 09/05/2018   FREET4 1.07 07/21/2018     Last  Foot exam findings: Monofilament sensation is reduced across most of the toes and distal plantar surfaces Absent pedal pulses   HYPERTENSION: She is now on antihypertensives with amlodipine and metoprolol She is followed by PCP She is also on Lasix, now is only taking this qod for history of edema  However she takes potassium every other day only and her recent level is low    Lab Results  Component Value Date   K 3.3 (L) 12/07/2018     She is still wearing a boot for her fibular fracture  Physical Examination:  BP 136/60 (BP Location: Left Arm, Patient Position: Sitting, Cuff Size: Normal)   Pulse 63    Ht 5' (1.524 m)   Wt 162 lb (73.5 kg)   SpO2 95%   BMI 31.64 kg/m   Standing blood pressure 110/58 No edema present  ASSESSMENT:   Diabetes type 2, on Omnipod insulin pump  See history of present illness for detailed discussion of current management, blood sugar patterns and problems identified  Her A1c is last 7.2 Fructosamine is 205 indicating overall average blood sugar is excellent  Blood sugars are running low sporadically at various times and occasionally in the 40s However sporadically will also have high readings Currently is only bolusing infrequently when blood sugar goes up Does not understand the need to bolus before the start of the meal if she is eating a significant meal Also postprandial readings are missing most of the time   HYPOTHYROIDISM: Her TSH is still below normal and she can reduce her dose to 137 mcg     PLAN:    Basal rates will be changed as below  She will try to use regular soft drinks, glucose tablets or juice for low sugar treatment  Basal rate changes: Midnight = 1. 0.  8 AM-11 AM = 1.  5., 11 AM- 3 PM = 1.8, 3 PM-10 PM = 2.2 and 10 PM = 1.6  She will start checking blood sugars before and after each meal regularly Discussed blood sugar targets after meals to be at least under 180  Especially if she is having high readings after eating she will try to bolus before starting to eat evening meals unless eating a very small meal She will be given a Contour meter and she will need to get data strips authorized for this through her insurance She will need to connect the meter to her pump  To call if she has more hypoglycemia  Take potassium supplement daily  Reduce amlodipine to half tablet  Follow-up in 2 months  Patient Instructions  Potassium daily   Counseling time on subjects discussed in assessment and plan sections is over 50% of today's 25 minute visit  Elayne Snare 12/09/2018, 12:59 PM   Note: This office note  was prepared with Dragon voice recognition system technology. Any transcriptional errors that result from this process are unintentional.

## 2018-12-09 NOTE — Telephone Encounter (Signed)
PA initiated via [email or web address] for Contour next test strips.  Bryson Ha Key: [id number] help? Call us at [phone] Status Sent to [place] find matching patient Drug Contour Next Test strips Form [employer] Medicare Part D Electronic Prior Authorization Form (2017 NCPDP)

## 2018-12-09 NOTE — Patient Instructions (Signed)
Potassium daily

## 2018-12-17 ENCOUNTER — Other Ambulatory Visit (HOSPITAL_COMMUNITY)
Admission: RE | Admit: 2018-12-17 | Discharge: 2018-12-17 | Disposition: A | Discharge status: Home / Self Care | Payer: Medicare Other | Source: Ambulatory Visit | Attending: Internal Medicine | Admitting: Internal Medicine

## 2018-12-17 DIAGNOSIS — Z01812 Encounter for preprocedural laboratory examination: Secondary | ICD-10-CM | POA: Insufficient documentation

## 2018-12-17 DIAGNOSIS — Z20828 Contact with and (suspected) exposure to other viral communicable diseases: Secondary | ICD-10-CM | POA: Insufficient documentation

## 2018-12-18 LAB — NOVEL CORONAVIRUS, NAA (HOSP ORDER, SEND-OUT TO REF LAB; TAT 18-24 HRS): SARS-CoV-2, NAA: NOT DETECTED

## 2018-12-19 ENCOUNTER — Other Ambulatory Visit: Payer: Self-pay

## 2018-12-19 ENCOUNTER — Telehealth: Payer: Self-pay

## 2018-12-19 NOTE — Telephone Encounter (Signed)
PA for bayer contour meter has been approved.  Donnalee Curry Key: [id number] - PA Case ID: [id number] Need help? Call us at [phone] Outcome Approvedtoday Request Reference Number: [id number]. [employer] CONTOUR METER is approved through 02/23/2020. For further questions, call [phone]. Drug Landscape architect device Form [employer] Medicare Part D Electronic Prior Authorization Form [phone] NCPDP  Request Reference Number: [id number]. [employer] CONTOUR METER is approved through 02/23/2020. For further questions, call [phone]

## 2018-12-19 NOTE — Telephone Encounter (Signed)
PA initiated via [email or web address] for Bayer contour meter to link with pt's omnipod insulin pump. Manuella Sahagian Key: [id number] - PA Case ID: [id number] Need help? Call us at [phone] Status Sent to [place] Monitor device Form [employer] Medicare Part D Electronic Prior Authorization Form [phone] NCPDP)

## 2018-12-21 ENCOUNTER — Other Ambulatory Visit: Payer: Self-pay

## 2018-12-21 ENCOUNTER — Ambulatory Visit (HOSPITAL_COMMUNITY)
Admission: RE | Disposition: A | Discharge status: Home / Self Care | Payer: Self-pay | Source: Home / Self Care | Attending: Internal Medicine

## 2018-12-21 ENCOUNTER — Ambulatory Visit (HOSPITAL_COMMUNITY): Payer: Medicare Other | Admitting: Certified Registered"

## 2018-12-21 ENCOUNTER — Ambulatory Visit (HOSPITAL_COMMUNITY)
Admission: RE | Admit: 2018-12-21 | Discharge: 2018-12-21 | Disposition: A | Discharge status: Home / Self Care | Payer: Medicare Other | Attending: Internal Medicine | Admitting: Internal Medicine

## 2018-12-21 ENCOUNTER — Encounter (HOSPITAL_COMMUNITY): Payer: Self-pay | Admitting: Certified Registered"

## 2018-12-21 DIAGNOSIS — I129 Hypertensive chronic kidney disease with stage 1 through stage 4 chronic kidney disease, or unspecified chronic kidney disease: Secondary | ICD-10-CM | POA: Insufficient documentation

## 2018-12-21 DIAGNOSIS — M797 Fibromyalgia: Secondary | ICD-10-CM | POA: Insufficient documentation

## 2018-12-21 DIAGNOSIS — Z7901 Long term (current) use of anticoagulants: Secondary | ICD-10-CM | POA: Insufficient documentation

## 2018-12-21 DIAGNOSIS — K219 Gastro-esophageal reflux disease without esophagitis: Secondary | ICD-10-CM | POA: Insufficient documentation

## 2018-12-21 DIAGNOSIS — Z8673 Personal history of transient ischemic attack (TIA), and cerebral infarction without residual deficits: Secondary | ICD-10-CM | POA: Insufficient documentation

## 2018-12-21 DIAGNOSIS — Z79890 Hormone replacement therapy (postmenopausal): Secondary | ICD-10-CM | POA: Diagnosis not present

## 2018-12-21 DIAGNOSIS — Z794 Long term (current) use of insulin: Secondary | ICD-10-CM | POA: Insufficient documentation

## 2018-12-21 DIAGNOSIS — E1140 Type 2 diabetes mellitus with diabetic neuropathy, unspecified: Secondary | ICD-10-CM | POA: Insufficient documentation

## 2018-12-21 DIAGNOSIS — E1122 Type 2 diabetes mellitus with diabetic chronic kidney disease: Secondary | ICD-10-CM | POA: Diagnosis not present

## 2018-12-21 DIAGNOSIS — F329 Major depressive disorder, single episode, unspecified: Secondary | ICD-10-CM | POA: Diagnosis not present

## 2018-12-21 DIAGNOSIS — I2510 Atherosclerotic heart disease of native coronary artery without angina pectoris: Secondary | ICD-10-CM | POA: Insufficient documentation

## 2018-12-21 DIAGNOSIS — I4892 Unspecified atrial flutter: Secondary | ICD-10-CM

## 2018-12-21 DIAGNOSIS — N189 Chronic kidney disease, unspecified: Secondary | ICD-10-CM | POA: Diagnosis not present

## 2018-12-21 DIAGNOSIS — Z95828 Presence of other vascular implants and grafts: Secondary | ICD-10-CM | POA: Insufficient documentation

## 2018-12-21 DIAGNOSIS — Z8249 Family history of ischemic heart disease and other diseases of the circulatory system: Secondary | ICD-10-CM | POA: Insufficient documentation

## 2018-12-21 DIAGNOSIS — E039 Hypothyroidism, unspecified: Secondary | ICD-10-CM | POA: Insufficient documentation

## 2018-12-21 DIAGNOSIS — E785 Hyperlipidemia, unspecified: Secondary | ICD-10-CM | POA: Insufficient documentation

## 2018-12-21 DIAGNOSIS — Z79899 Other long term (current) drug therapy: Secondary | ICD-10-CM | POA: Diagnosis not present

## 2018-12-21 DIAGNOSIS — J449 Chronic obstructive pulmonary disease, unspecified: Secondary | ICD-10-CM | POA: Insufficient documentation

## 2018-12-21 DIAGNOSIS — E1151 Type 2 diabetes mellitus with diabetic peripheral angiopathy without gangrene: Secondary | ICD-10-CM | POA: Insufficient documentation

## 2018-12-21 DIAGNOSIS — I483 Typical atrial flutter: Secondary | ICD-10-CM | POA: Diagnosis present

## 2018-12-21 DIAGNOSIS — Z87891 Personal history of nicotine dependence: Secondary | ICD-10-CM | POA: Diagnosis not present

## 2018-12-21 DIAGNOSIS — F419 Anxiety disorder, unspecified: Secondary | ICD-10-CM | POA: Insufficient documentation

## 2018-12-21 HISTORY — PX: A-FLUTTER ABLATION: EP1230

## 2018-12-21 LAB — GLUCOSE, CAPILLARY
Glucose-Capillary: 76 mg/dL (ref 70–99)
Glucose-Capillary: 122 mg/dL — ABNORMAL HIGH (ref 70–99)
Glucose-Capillary: 68 mg/dL — ABNORMAL LOW (ref 70–99)
Glucose-Capillary: 83 mg/dL (ref 70–99)
Glucose-Capillary: 53 mg/dL — ABNORMAL LOW (ref 70–99)
Glucose-Capillary: 64 mg/dL — ABNORMAL LOW (ref 70–99)
Glucose-Capillary: 72 mg/dL (ref 70–99)
Glucose-Capillary: 121 mg/dL — ABNORMAL HIGH (ref 70–99)
Glucose-Capillary: 152 mg/dL — ABNORMAL HIGH (ref 70–99)

## 2018-12-21 LAB — POCT I-STAT, CHEM 8
BUN: 22 mg/dL (ref 8–23)
Calcium, Ion: 1.25 mmol/L (ref 1.15–1.40)
Chloride: 108 mmol/L (ref 98–111)
Creatinine, Ser: 2 mg/dL — ABNORMAL HIGH (ref 0.44–1.00)
Glucose, Bld: 79 mg/dL (ref 70–99)
HCT: 32 % — ABNORMAL LOW (ref 36.0–46.0)
Hemoglobin: 10.9 g/dL — ABNORMAL LOW (ref 12.0–15.0)
Potassium: 3.2 mmol/L — ABNORMAL LOW (ref 3.5–5.1)
Sodium: 144 mmol/L (ref 135–145)
TCO2: 22 mmol/L (ref 22–32)

## 2018-12-21 SURGERY — A-FLUTTER ABLATION
Anesthesia: General

## 2018-12-21 MED ORDER — BUPIVACAINE HCL (PF) 0.25 % IJ SOLN
INTRAMUSCULAR | Status: DC | PRN
  Administered 2018-12-21: 60 mL

## 2018-12-21 MED ORDER — LIDOCAINE 2% (20 MG/ML) 5 ML SYRINGE
INTRAMUSCULAR | Status: DC | PRN
  Administered 2018-12-21: 100 mg via INTRAVENOUS

## 2018-12-21 MED ORDER — BUPIVACAINE HCL (PF) 0.25 % IJ SOLN
INTRAMUSCULAR | Status: AC
  Filled 2018-12-21: qty 60

## 2018-12-21 MED ORDER — HEPARIN (PORCINE) IN NACL 1000-0.9 UT/500ML-% IV SOLN
INTRAVENOUS | Status: AC
  Filled 2018-12-21: qty 500

## 2018-12-21 MED ORDER — ROCURONIUM BROMIDE 10 MG/ML (PF) SYRINGE
PREFILLED_SYRINGE | INTRAVENOUS | Status: DC | PRN
  Administered 2018-12-21: 70 mg via INTRAVENOUS

## 2018-12-21 MED ORDER — POTASSIUM CHLORIDE 10 MEQ/100ML IV SOLN
INTRAVENOUS | Status: AC
  Administered 2018-12-21: 10:00:00 10 meq via INTRAVENOUS
  Filled 2018-12-21: qty 100

## 2018-12-21 MED ORDER — ONDANSETRON HCL 4 MG/2ML IJ SOLN
INTRAMUSCULAR | Status: DC | PRN
  Administered 2018-12-21: 4 mg via INTRAVENOUS

## 2018-12-21 MED ORDER — DEXAMETHASONE SODIUM PHOSPHATE 10 MG/ML IJ SOLN
INTRAMUSCULAR | Status: DC | PRN
  Administered 2018-12-21: 5 mg via INTRAVENOUS

## 2018-12-21 MED ORDER — DEXTROSE 50 % IV SOLN
INTRAVENOUS | Status: AC
  Filled 2018-12-21: qty 50

## 2018-12-21 MED ORDER — POTASSIUM CHLORIDE 10 MEQ/100ML IV SOLN
INTRAVENOUS | Status: AC
  Filled 2018-12-21: qty 100

## 2018-12-21 MED ORDER — ONDANSETRON HCL 4 MG/2ML IJ SOLN: 4.0000 mg | Freq: Four times a day (QID) | INTRAMUSCULAR | Status: DC | PRN

## 2018-12-21 MED ORDER — FENTANYL CITRATE (PF) 100 MCG/2ML IJ SOLN
INTRAMUSCULAR | Status: DC | PRN
  Administered 2018-12-21: 100 ug via INTRAVENOUS

## 2018-12-21 MED ORDER — ACETAMINOPHEN 325 MG PO TABS
650.0000 mg | ORAL_TABLET | ORAL | Status: DC | PRN
  Filled 2018-12-21: qty 2

## 2018-12-21 MED ORDER — PHENYLEPHRINE HCL-NACL 10-0.9 MG/250ML-% IV SOLN
INTRAVENOUS | Status: DC | PRN
  Administered 2018-12-21: 25 ug/min via INTRAVENOUS

## 2018-12-21 MED ORDER — MIDAZOLAM HCL 5 MG/5ML IJ SOLN
INTRAMUSCULAR | Status: DC | PRN
  Administered 2018-12-21: 2 mg via INTRAVENOUS

## 2018-12-21 MED ORDER — SUGAMMADEX SODIUM 200 MG/2ML IV SOLN
INTRAVENOUS | Status: DC | PRN
  Administered 2018-12-21: 200 mg via INTRAVENOUS

## 2018-12-21 MED ORDER — SODIUM CHLORIDE 0.9% FLUSH: 3.0000 mL | Freq: Two times a day (BID) | INTRAVENOUS | Status: DC

## 2018-12-21 MED ORDER — SODIUM CHLORIDE 0.9 % IV SOLN: 250.0000 mL | INTRAVENOUS | Status: DC | PRN

## 2018-12-21 MED ORDER — POTASSIUM CHLORIDE 10 MEQ/100ML IV SOLN
10.0000 meq | INTRAVENOUS | Status: AC
  Administered 2018-12-21 (×2): 10 meq via INTRAVENOUS

## 2018-12-21 MED ORDER — PROPOFOL 10 MG/ML IV BOLUS
INTRAVENOUS | Status: DC | PRN
  Administered 2018-12-21: 100 mg via INTRAVENOUS

## 2018-12-21 MED ORDER — SODIUM CHLORIDE 0.9 % IV SOLN
INTRAVENOUS | Status: DC
  Administered 2018-12-21: 10:00:00 via INTRAVENOUS

## 2018-12-21 MED ORDER — SODIUM CHLORIDE 0.9% FLUSH: 3.0000 mL | INTRAVENOUS | Status: DC | PRN

## 2018-12-21 MED ORDER — DEXTROSE 50 % IV SOLN
25.0000 mL | Freq: Once | INTRAVENOUS | Status: AC
  Administered 2018-12-21: 25 mL via INTRAVENOUS

## 2018-12-21 MED ORDER — HYDROCODONE-ACETAMINOPHEN 10-325 MG PO TABS
1.0000 | ORAL_TABLET | Freq: Once | ORAL | Status: AC
  Administered 2018-12-21: 1 via ORAL
  Filled 2018-12-21: qty 1

## 2018-12-21 SURGICAL SUPPLY — 14 items
BAG SNAP BAND KOVER 36X36 (MISCELLANEOUS) ×4 IMPLANT
BLANKET WARM UNDERBOD FULL ACC (MISCELLANEOUS) ×2 IMPLANT
CATH BLAZERPRIME XP LG CV 10MM (ABLATOR) ×2 IMPLANT
CATH DUODECA HALO/ISMUS 7FR (CATHETERS) ×2 IMPLANT
CATH OCTAPOLOR 6F 125CM 2-5-2 (CATHETERS) ×2 IMPLANT
CATH QUAD COURNAND 5FR REPROC (CATHETERS) ×2 IMPLANT
KIT MICROPUNCTURE NIT STIFF (SHEATH) ×2 IMPLANT
PACK EP LATEX FREE (CUSTOM PROCEDURE TRAY) ×3
PACK EP LF (CUSTOM PROCEDURE TRAY) ×1 IMPLANT
PAD PRO RADIOLUCENT 2001M-C (PAD) ×3 IMPLANT
SHEATH ATRIAL FLUTTER SAFL 8F (SHEATH) ×2 IMPLANT
SHEATH PINNACLE 6F 10CM (SHEATH) ×2 IMPLANT
SHEATH PINNACLE 7F 10CM (SHEATH) ×2 IMPLANT
SHEATH PINNACLE 8F 10CM (SHEATH) ×4 IMPLANT

## 2018-12-21 NOTE — Discharge Instructions (Signed)
Cardiac Ablation, Care After °This sheet gives you information about how to care for yourself after your procedure. Your health care provider may also give you more specific instructions. If you have problems or questions, contact your health care provider. °What can I expect after the procedure? °After the procedure, it is common to have: °· Bruising around your puncture site. °· Tenderness around your puncture site. °· Skipped heartbeats. °· Tiredness (fatigue). ° °Follow these instructions at home: °Puncture site care  °· Follow instructions from your health care provider about how to take care of your puncture site. Make sure you: °? If present, leave stitches (sutures), skin glue, or adhesive strips in place. These skin closures may need to stay in place for up to 2 weeks. If adhesive strip edges start to loosen and curl up, you may trim the loose edges. Do not remove adhesive strips completely unless your health care provider tells you to do that. °· Check your puncture site every day for signs of infection. Check for: °? Redness, swelling, or pain. °? Fluid or blood. If your puncture site starts to bleed, lie down on your back, apply firm pressure to the area, and contact your health care provider. °? Warmth. °? Pus or a bad smell. °Driving °· Do not drive for at least 4 days after your procedure or however long your health care provider recommends. °· Do not drive or use heavy machinery while taking prescription pain medicine. °· Do not drive for 24 hours if you were given a medicine to help you relax (sedative) during your procedure. °Activity °· Avoid activities that take a lot of effort for at least 7 days after your procedure. °· Do not lift anything that is heavier than 5 lb (4.5 kg) for one week.  °· No sexual activity for 1 week.  °· Return to your normal activities as told by your health care provider. Ask your health care provider what activities are safe for you. °General instructions °· Take  over-the-counter and prescription medicines only as told by your health care provider. °· Do not use any products that contain nicotine or tobacco, such as cigarettes and e-cigarettes. If you need help quitting, ask your health care provider. °· You may shower after 24 hours, but Do not take baths, swim, or use a hot tub for 1 week.  °· Do not drink alcohol for 24 hours after your procedure. °· Keep all follow-up visits as told by your health care provider. This is important. °Contact a health care provider if: °· You have redness, mild swelling, or pain around your puncture site. °· You have fluid or blood coming from your puncture site that stops after applying firm pressure to the area. °· Your puncture site feels warm to the touch. °· You have pus or a bad smell coming from your puncture site. °· You have a fever. °· You have chest pain or discomfort that spreads to your neck, jaw, or arm. °· You are sweating a lot. °· You feel nauseous. °· You have a fast or irregular heartbeat. °· You have shortness of breath. °· You are dizzy or light-headed and feel the need to lie down. °· You have pain or numbness in the arm or leg closest to your puncture site. °Get help right away if: °· Your puncture site suddenly swells. °· Your puncture site is bleeding and the bleeding does not stop after applying firm pressure to the area. °These symptoms may represent a serious problem that   is an emergency. Do not wait to see if the symptoms will go away. Get medical help right away. Call your local emergency services (911 in the [place] Do not drive yourself to the hospital. °Summary °· After the procedure, it is normal to have bruising and tenderness at the puncture site in your groin, neck, or forearm. °· Check your puncture site every day for signs of infection. °· Get help right away if your puncture site is bleeding and the bleeding does not stop after applying firm pressure to the area. This is a medical emergency. °This  information is not intended to replace advice given to you by your health care provider. Make sure you discuss any questions you have with your health care provider. ° ° ° °

## 2018-12-21 NOTE — Progress Notes (Signed)
No bleeding or hematoma noted after ambulation 

## 2018-12-21 NOTE — Progress Notes (Signed)
Drank approx 6 oz OJ. Skin warm and dry.

## 2018-12-21 NOTE — Progress Notes (Signed)
Site area: rt groin fv sheaths x2 Site Prior to Removal:  Level 0 Pressure Applied For: 15 minutes Manual:   yes Patient Status During Pull:  stable Post Pull Site:  Level  0 Post Pull Instructions Given:  yes Post Pull Pulses Present: rt dp dopplered Dressing Applied:  Gauze and tegaderm Bedrest begins @  Comments:

## 2018-12-21 NOTE — Progress Notes (Signed)
Site area: left groin fv sheaths x2 Site Prior to Removal:  Level 0 Pressure Applied For:  15 minutes Manual:   yes Patient Status During Pull:  stable Post Pull Site:  Level  0 Post Pull Instructions Given:  yes Post Pull Pulses Present: left dp dopplered Dressing Applied:  Gauze and tegaderm Bedrest begins @ 1425 Comments:  IV saline locked

## 2018-12-21 NOTE — Interval H&P Note (Signed)
History and Physical Interval Note:  12/21/2018 9:31 AM  April Branch  has presented today for surgery, with the diagnosis of aflutter.  The various methods of treatment have been discussed with the patient and family. After consideration of risks, benefits and other options for treatment, the patient has consented to  Procedure(s): A-FLUTTER ABLATION (N/A) as a surgical intervention.  The patient's history has been reviewed, patient examined, no change in status, stable for surgery.  I have reviewed the patient's chart and labs.  Questions were answered to the patient's satisfaction.     Virl Axe

## 2018-12-21 NOTE — Anesthesia Postprocedure Evaluation (Signed)
Anesthesia Post Note  Patient: April Branch  Procedure(s) Performed: A-FLUTTER ABLATION (N/A )     Patient location during evaluation: PACU Anesthesia Type: General Level of consciousness: awake and alert Pain management: pain level controlled Vital Signs Assessment: post-procedure vital signs reviewed and stable Respiratory status: spontaneous breathing, nonlabored ventilation, respiratory function stable and patient connected to nasal cannula oxygen Cardiovascular status: blood pressure returned to baseline and stable Postop Assessment: no apparent nausea or vomiting Anesthetic complications: no    Last Vitals:  Vitals:   12/21/18 1435 12/21/18 1450  BP: (!) 126/44 (!) 113/47  Pulse: 71 72  Resp: (!) 21 (!) 23  Temp: 36.5 C   SpO2: 92% 92%    Last Pain:  Vitals:   12/21/18 1435  TempSrc: Temporal  PainSc: 0-No pain                 Nishan Ovens DAVID

## 2018-12-21 NOTE — Transfer of Care (Signed)
Immediate Anesthesia Transfer of Care Note  Patient: April Branch  Procedure(s) Performed: A-FLUTTER ABLATION (N/A )  Patient Location: Cath Lab  Anesthesia Type:General  Level of Consciousness: drowsy  Airway & Oxygen Therapy: Patient Spontanous Breathing and Patient connected to nasal cannula oxygen  Post-op Assessment: Report given to RN and Post -op Vital signs reviewed and stable  Post vital signs: Reviewed and stable  Last Vitals:  Vitals Value Taken Time  BP 118/50 12/21/18 1353  Temp 36.5 C 12/21/18 1333  Pulse 72 12/21/18 1356  Resp 23 12/21/18 1356  SpO2 100 % 12/21/18 1356  Vitals shown include unvalidated device data.  Last Pain:  Vitals:   12/21/18 1333  TempSrc: Temporal  PainSc: Asleep         Complications: No apparent anesthesia complications

## 2018-12-21 NOTE — Anesthesia Preprocedure Evaluation (Addendum)
Anesthesia Evaluation  Patient identified by MRN, date of birth, ID band Patient awake    Reviewed: Allergy & Precautions, NPO status , Patient's Chart, lab work & pertinent test results, reviewed documented beta blocker date and time   History of Anesthesia Complications Negative for: history of anesthetic complications  Airway Mallampati: III  TM Distance: >3 FB Neck ROM: Full  Mouth opening: Limited Mouth Opening  Dental  (+) Upper Dentures   Pulmonary COPD, Current SmokerPatient did not abstain from smoking.,    Pulmonary exam normal        Cardiovascular hypertension, Pt. on medications and Pt. on home beta blockers + CAD, + Peripheral Vascular Disease and +CHF  Normal cardiovascular exam+ dysrhythmias (on Xarelto) Atrial Fibrillation   TTE 09/2018: EF 60-65%    Neuro/Psych Anxiety Depression TIACVA negative psych ROS   GI/Hepatic Neg liver ROS, GERD  Medicated and Controlled,  Endo/Other  diabetes, Type 2, Insulin DependentHypothyroidism   Renal/GU Renal InsufficiencyRenal disease  negative genitourinary   Musculoskeletal  (+) Arthritis , Fibromyalgia -  Abdominal   Peds  Hematology  (+) anemia , Hgb 10.9   Anesthesia Other Findings Day of surgery medications reviewed with patient.  Reproductive/Obstetrics negative OB ROS                            Anesthesia Physical Anesthesia Plan  ASA: III  Anesthesia Plan: General   Post-op Pain Management:    Induction: Intravenous  PONV Risk Score and Plan: 3 and Treatment may vary due to age or medical condition and Ondansetron  Airway Management Planned: Oral ETT  Additional Equipment: None  Intra-op Plan:   Post-operative Plan: Extubation in OR  Informed Consent: I have reviewed the patients History and Physical, chart, labs and discussed the procedure including the risks, benefits and alternatives for the proposed anesthesia  with the patient or authorized representative who has indicated his/her understanding and acceptance.     Dental advisory given  Plan Discussed with: CRNA  Anesthesia Plan Comments:        Anesthesia Quick Evaluation

## 2018-12-21 NOTE — Anesthesia Procedure Notes (Signed)
Procedure Name: Intubation Date/Time: 12/21/2018 11:55 AM Performed by: Imagene Riches, CRNA Pre-anesthesia Checklist: Patient identified, Emergency Drugs available, Suction available and Patient being monitored Patient Re-evaluated:Patient Re-evaluated prior to induction Oxygen Delivery Method: Circle System Utilized Preoxygenation: Pre-oxygenation with 100% oxygen Induction Type: IV induction Ventilation: Mask ventilation without difficulty Laryngoscope Size: Miller and 2 Grade View: Grade I Tube type: Oral Tube size: 7.0 mm Number of attempts: 1 Airway Equipment and Method: Stylet and Oral airway Placement Confirmation: ETT inserted through vocal cords under direct vision,  positive ETCO2 and breath sounds checked- equal and bilateral Secured at: 22 cm Tube secured with: Tape Dental Injury: Teeth and Oropharynx as per pre-operative assessment

## 2018-12-22 ENCOUNTER — Encounter (HOSPITAL_COMMUNITY): Payer: Self-pay | Admitting: Internal Medicine

## 2018-12-22 MED FILL — Heparin Sod (Porcine)-NaCl IV Soln 1000 Unit/500ML-0.9%: INTRAVENOUS | Qty: 500 | Status: AC

## 2019-01-07 ENCOUNTER — Other Ambulatory Visit: Payer: Self-pay | Admitting: Endocrinology

## 2019-01-14 ENCOUNTER — Other Ambulatory Visit: Payer: Self-pay

## 2019-01-14 ENCOUNTER — Emergency Department (HOSPITAL_COMMUNITY): Payer: Medicare Other

## 2019-01-14 ENCOUNTER — Encounter (HOSPITAL_COMMUNITY): Payer: Self-pay

## 2019-01-14 ENCOUNTER — Observation Stay (HOSPITAL_COMMUNITY)
Admission: EM | Admit: 2019-01-14 | Discharge: 2019-01-24 | Disposition: E | Payer: Medicare Other | Attending: Family Medicine | Admitting: Family Medicine

## 2019-01-14 DIAGNOSIS — K219 Gastro-esophageal reflux disease without esophagitis: Secondary | ICD-10-CM | POA: Diagnosis not present

## 2019-01-14 DIAGNOSIS — J189 Pneumonia, unspecified organism: Secondary | ICD-10-CM | POA: Insufficient documentation

## 2019-01-14 DIAGNOSIS — N179 Acute kidney failure, unspecified: Secondary | ICD-10-CM | POA: Diagnosis not present

## 2019-01-14 DIAGNOSIS — E785 Hyperlipidemia, unspecified: Secondary | ICD-10-CM | POA: Diagnosis not present

## 2019-01-14 DIAGNOSIS — I469 Cardiac arrest, cause unspecified: Secondary | ICD-10-CM | POA: Diagnosis not present

## 2019-01-14 DIAGNOSIS — E1151 Type 2 diabetes mellitus with diabetic peripheral angiopathy without gangrene: Secondary | ICD-10-CM | POA: Insufficient documentation

## 2019-01-14 DIAGNOSIS — E039 Hypothyroidism, unspecified: Secondary | ICD-10-CM | POA: Diagnosis not present

## 2019-01-14 DIAGNOSIS — E119 Type 2 diabetes mellitus without complications: Secondary | ICD-10-CM

## 2019-01-14 DIAGNOSIS — E1122 Type 2 diabetes mellitus with diabetic chronic kidney disease: Secondary | ICD-10-CM | POA: Insufficient documentation

## 2019-01-14 DIAGNOSIS — Z79899 Other long term (current) drug therapy: Secondary | ICD-10-CM | POA: Diagnosis not present

## 2019-01-14 DIAGNOSIS — I10 Essential (primary) hypertension: Secondary | ICD-10-CM | POA: Diagnosis present

## 2019-01-14 DIAGNOSIS — J449 Chronic obstructive pulmonary disease, unspecified: Secondary | ICD-10-CM | POA: Insufficient documentation

## 2019-01-14 DIAGNOSIS — I4892 Unspecified atrial flutter: Secondary | ICD-10-CM | POA: Diagnosis not present

## 2019-01-14 DIAGNOSIS — Z20828 Contact with and (suspected) exposure to other viral communicable diseases: Secondary | ICD-10-CM | POA: Insufficient documentation

## 2019-01-14 DIAGNOSIS — I69951 Hemiplegia and hemiparesis following unspecified cerebrovascular disease affecting right dominant side: Secondary | ICD-10-CM | POA: Insufficient documentation

## 2019-01-14 DIAGNOSIS — M797 Fibromyalgia: Secondary | ICD-10-CM | POA: Insufficient documentation

## 2019-01-14 DIAGNOSIS — Z85528 Personal history of other malignant neoplasm of kidney: Secondary | ICD-10-CM | POA: Diagnosis not present

## 2019-01-14 DIAGNOSIS — R4781 Slurred speech: Secondary | ICD-10-CM | POA: Insufficient documentation

## 2019-01-14 DIAGNOSIS — E860 Dehydration: Secondary | ICD-10-CM | POA: Diagnosis not present

## 2019-01-14 DIAGNOSIS — R4182 Altered mental status, unspecified: Principal | ICD-10-CM | POA: Insufficient documentation

## 2019-01-14 DIAGNOSIS — Z87891 Personal history of nicotine dependence: Secondary | ICD-10-CM | POA: Insufficient documentation

## 2019-01-14 DIAGNOSIS — E872 Acidosis: Secondary | ICD-10-CM | POA: Diagnosis not present

## 2019-01-14 DIAGNOSIS — R748 Abnormal levels of other serum enzymes: Secondary | ICD-10-CM | POA: Diagnosis present

## 2019-01-14 DIAGNOSIS — I5022 Chronic systolic (congestive) heart failure: Secondary | ICD-10-CM | POA: Insufficient documentation

## 2019-01-14 DIAGNOSIS — I2510 Atherosclerotic heart disease of native coronary artery without angina pectoris: Secondary | ICD-10-CM | POA: Diagnosis not present

## 2019-01-14 DIAGNOSIS — Z7901 Long term (current) use of anticoagulants: Secondary | ICD-10-CM | POA: Insufficient documentation

## 2019-01-14 DIAGNOSIS — E1142 Type 2 diabetes mellitus with diabetic polyneuropathy: Secondary | ICD-10-CM | POA: Diagnosis not present

## 2019-01-14 DIAGNOSIS — Z79890 Hormone replacement therapy (postmenopausal): Secondary | ICD-10-CM | POA: Insufficient documentation

## 2019-01-14 DIAGNOSIS — Y95 Nosocomial condition: Secondary | ICD-10-CM | POA: Insufficient documentation

## 2019-01-14 DIAGNOSIS — Z7902 Long term (current) use of antithrombotics/antiplatelets: Secondary | ICD-10-CM | POA: Insufficient documentation

## 2019-01-14 DIAGNOSIS — I130 Hypertensive heart and chronic kidney disease with heart failure and stage 1 through stage 4 chronic kidney disease, or unspecified chronic kidney disease: Secondary | ICD-10-CM | POA: Diagnosis not present

## 2019-01-14 DIAGNOSIS — Z7982 Long term (current) use of aspirin: Secondary | ICD-10-CM | POA: Insufficient documentation

## 2019-01-14 DIAGNOSIS — N183 Chronic kidney disease, stage 3 unspecified: Secondary | ICD-10-CM | POA: Diagnosis not present

## 2019-01-14 DIAGNOSIS — Z794 Long term (current) use of insulin: Secondary | ICD-10-CM | POA: Insufficient documentation

## 2019-01-14 LAB — RAPID URINE DRUG SCREEN, HOSP PERFORMED
Amphetamines: NOT DETECTED
Barbiturates: NOT DETECTED
Benzodiazepines: NOT DETECTED
Cocaine: NOT DETECTED
Opiates: POSITIVE — AB
Tetrahydrocannabinol: NOT DETECTED

## 2019-01-14 LAB — URINALYSIS, ROUTINE W REFLEX MICROSCOPIC
Bacteria, UA: NONE SEEN
Bilirubin Urine: NEGATIVE
Glucose, UA: NEGATIVE mg/dL
Ketones, ur: NEGATIVE mg/dL
Leukocytes,Ua: NEGATIVE
Nitrite: NEGATIVE
Protein, ur: 30 mg/dL — AB
Specific Gravity, Urine: 1.015 (ref 1.005–1.030)
pH: 5 (ref 5.0–8.0)

## 2019-01-14 LAB — DIFFERENTIAL
Abs Immature Granulocytes: 0.16 10*3/uL — ABNORMAL HIGH (ref 0.00–0.07)
Basophils Absolute: 0 10*3/uL (ref 0.0–0.1)
Basophils Relative: 0 %
Eosinophils Absolute: 0 10*3/uL (ref 0.0–0.5)
Eosinophils Relative: 0 %
Immature Granulocytes: 1 %
Lymphocytes Relative: 10 %
Lymphs Abs: 1.5 10*3/uL (ref 0.7–4.0)
Monocytes Absolute: 1.2 10*3/uL — ABNORMAL HIGH (ref 0.1–1.0)
Monocytes Relative: 8 %
Neutro Abs: 12.5 10*3/uL — ABNORMAL HIGH (ref 1.7–7.7)
Neutrophils Relative %: 81 %

## 2019-01-14 LAB — CBC
HCT: 29.3 % — ABNORMAL LOW (ref 36.0–46.0)
Hemoglobin: 9.1 g/dL — ABNORMAL LOW (ref 12.0–15.0)
MCH: 26.5 pg (ref 26.0–34.0)
MCHC: 31.1 g/dL (ref 30.0–36.0)
MCV: 85.2 fL (ref 80.0–100.0)
Platelets: 317 10*3/uL (ref 150–400)
RBC: 3.44 MIL/uL — ABNORMAL LOW (ref 3.87–5.11)
RDW: 15.4 % (ref 11.5–15.5)
WBC: 15.4 10*3/uL — ABNORMAL HIGH (ref 4.0–10.5)
nRBC: 0.1 % (ref 0.0–0.2)

## 2019-01-14 LAB — POCT I-STAT EG7
Acid-base deficit: 6 mmol/L — ABNORMAL HIGH (ref 0.0–2.0)
Bicarbonate: 17.9 mmol/L — ABNORMAL LOW (ref 20.0–28.0)
Calcium, Ion: 1.1 mmol/L — ABNORMAL LOW (ref 1.15–1.40)
HCT: 29 % — ABNORMAL LOW (ref 36.0–46.0)
Hemoglobin: 9.9 g/dL — ABNORMAL LOW (ref 12.0–15.0)
O2 Saturation: 98 %
Potassium: 3.6 mmol/L (ref 3.5–5.1)
Sodium: 139 mmol/L (ref 135–145)
TCO2: 19 mmol/L — ABNORMAL LOW (ref 22–32)
pCO2, Ven: 29.9 mmHg — ABNORMAL LOW (ref 44.0–60.0)
pH, Ven: 7.386 (ref 7.250–7.430)
pO2, Ven: 96 mmHg — ABNORMAL HIGH (ref 32.0–45.0)

## 2019-01-14 LAB — PROTIME-INR
INR: 1.3 — ABNORMAL HIGH (ref 0.8–1.2)
Prothrombin Time: 16 seconds — ABNORMAL HIGH (ref 11.4–15.2)

## 2019-01-14 LAB — COMPREHENSIVE METABOLIC PANEL
ALT: 16 U/L (ref 0–44)
AST: 21 U/L (ref 15–41)
Albumin: 3.5 g/dL (ref 3.5–5.0)
Alkaline Phosphatase: 86 U/L (ref 38–126)
Anion gap: 15 (ref 5–15)
BUN: 55 mg/dL — ABNORMAL HIGH (ref 8–23)
CO2: 15 mmol/L — ABNORMAL LOW (ref 22–32)
Calcium: 8.5 mg/dL — ABNORMAL LOW (ref 8.9–10.3)
Chloride: 106 mmol/L (ref 98–111)
Creatinine, Ser: 2.94 mg/dL — ABNORMAL HIGH (ref 0.44–1.00)
GFR calc Af Amer: 18 mL/min — ABNORMAL LOW (ref >60)
GFR calc non Af Amer: 15 mL/min — ABNORMAL LOW (ref >60)
Glucose, Bld: 111 mg/dL — ABNORMAL HIGH (ref 70–99)
Potassium: 3.5 mmol/L (ref 3.5–5.1)
Sodium: 136 mmol/L (ref 135–145)
Total Bilirubin: <0.1 mg/dL — ABNORMAL LOW (ref 0.3–1.2)
Total Protein: 6.8 g/dL (ref 6.5–8.1)

## 2019-01-14 LAB — APTT: aPTT: 31 seconds (ref 24–36)

## 2019-01-14 LAB — CBG MONITORING, ED
Glucose-Capillary: 121 mg/dL — ABNORMAL HIGH (ref 70–99)
Glucose-Capillary: 169 mg/dL — ABNORMAL HIGH (ref 70–99)

## 2019-01-14 MED ORDER — SODIUM CHLORIDE 0.9 % IV SOLN
2.0000 g | Freq: Once | INTRAVENOUS | Status: AC
  Administered 2019-01-14: 2 g via INTRAVENOUS
  Filled 2019-01-14: qty 2

## 2019-01-14 MED ORDER — LACTATED RINGERS IV BOLUS (SEPSIS)
1000.0000 mL | Freq: Once | INTRAVENOUS | Status: AC
  Administered 2019-01-14: 1000 mL via INTRAVENOUS

## 2019-01-14 MED ORDER — VANCOMYCIN HCL IN DEXTROSE 1-5 GM/200ML-% IV SOLN: 1000.0000 mg | Freq: Once | INTRAVENOUS | Status: DC

## 2019-01-14 MED ORDER — SODIUM CHLORIDE 0.9% FLUSH
3.0000 mL | Freq: Once | INTRAVENOUS | Status: AC
Start: 2019-01-14 — End: 2019-01-14
  Administered 2019-01-14: 3 mL via INTRAVENOUS

## 2019-01-14 MED ORDER — VANCOMYCIN HCL 10 G IV SOLR
1250.0000 mg | Freq: Once | INTRAVENOUS | Status: AC
  Administered 2019-01-15: 01:00:00 1250 mg via INTRAVENOUS
  Filled 2019-01-14: qty 1250

## 2019-01-14 MED ORDER — LACTATED RINGERS IV BOLUS (SEPSIS): 250.0000 mL | Freq: Once | INTRAVENOUS | Status: DC

## 2019-01-14 MED ORDER — METRONIDAZOLE IN NACL 5-0.79 MG/ML-% IV SOLN
500.0000 mg | Freq: Once | INTRAVENOUS | Status: AC
  Administered 2019-01-14: 500 mg via INTRAVENOUS
  Filled 2019-01-14: qty 100

## 2019-01-14 NOTE — ED Provider Notes (Signed)
[facility] EMERGENCY DEPARTMENT Provider Note   CSN: [id number] Arrival date & time: 01/20/2019  1813     History   Chief Complaint Chief Complaint  Patient presents with  . Aphasia  . Altered Mental Status    HPI April Branch is a 73 y.o. female.     Patient with numerous past medical problems as listed below, presents to the emergency department with a chief complaint of slurred speech, and generalized weakness.  I spoke with the patient's significant other, Nehemiah Settle, who states that the symptoms started yesterday.  He states that the patient quit speaking and could not walk.  He is concerned that she has had a stroke.  Level 5 caveat applies 2/2 confusion.  Spouse speaks English very poorly and it is hard to understand entirely over the phone, but he does state that she has not been sick.  The symptoms started yesterday.  She cannot talk and cannot walk.  Per chart review the patient has been admitted before in August of this year for acute encephalopathy thought to be due to polypharmacy and dehydration.  The history is provided by the spouse and medical records. No language interpreter was used.    Past Medical History:  Diagnosis Date  . Anemia   . Anxiety   . Arthritis   . CAD (coronary artery disease) 2016   non-obstructive at cath  . Colon polyps 09/11/2010   Tubular adenoma  . COPD 12/14/2008   PATIENT DENIES    . Depression   . Diabetic coma ([place]) Feb. 2014  . Fall at home 2016   x 2 in January 2016, Left knee  . Fibromyalgia   . GERD 07/20/2006  . Headache(784.0)   . History of transient ischemic attack (TIA)   . HPV (human papilloma virus) infection   . Hyperlipidemia   . Hypertension   . Hypothyroidism   . Insulin dependent type 2 diabetes mellitus, controlled ([place]) 1989  . Lumbar disc disease   . Neuromuscular disorder ([place])    PERIPHERAL NEUROPATHY  . Peripheral vascular disease ([place])   . Personal history of malignant  neoplasm of kidney(V10.52) 12/14/2008   Laparoscopic biopsy and cryoablation 7/08 Dr. Vernie Shanks   . Renal disorder    chronic kidney disease   . Stroke [facility])    3 MINI STROKES  RT SIDED WEAKNESS  . Vertigo     Patient Active Problem List   Diagnosis Date Noted  . AKI (acute kidney injury) ([place]) 10/09/2018  . Acute encephalopathy 10/07/2018  . Fall   . Pulmonary nodule 08/15/2018  . Abnormal findings on diagnostic imaging of lung 08/15/2018  . Suspected COVID-19 virus infection 08/04/2018  . Bronchitis 06/21/2018  . Oral thrush 06/21/2018  . ILD (interstitial lung disease) ([place]) 04/18/2018  . Insulin dependent diabetes mellitus   . Acute on chronic diastolic CHF (congestive heart failure) ([place]) 04/02/2018  . Acute pulmonary edema (HCC)   . Left-sided weakness 03/16/2018  . TIA (transient ischemic attack) 03/16/2018  . Respiratory failure with hypoxia ([place]) 03/16/2018  . Dizziness 04/23/2017  . Hyperglycemia 10/31/2016  . Closed fracture of transverse process of lumbar vertebra ([place]) 10/31/2016  . Hematoma 10/31/2016  . Recurrent falls 10/31/2016  . Syncope 10/31/2016  . Gait abnormality 10/19/2016  . Diabetic peripheral neuropathy ([place]) 10/19/2016  . Symptomatic bradycardia 10/13/2016  . Bradycardia 10/12/2016  . General weakness 10/12/2016  . CKD (chronic kidney disease), stage III ([place]) 10/12/2016  . Weakness   .  Orthostatic syncope 05/10/2016  . Colitis 05/10/2016  . Anemia 05/10/2016  . Food impaction of esophagus 09/02/2015  . Diabetic foot ulcer ([place]) 06/24/2015  . Atheroscler of bypass graft of left leg with intermittent claudication ([place]) 04/17/2014  . Carotid stenosis 05/19/2013  . Acute kidney injury superimposed on CKD ([place]) 09/21/2012  . Chronic pain syndrome 03/13/2012  . Atherosclerosis of native artery of extremity with intermittent claudication ([place]) 10/08/2011  . PVD (peripheral vascular disease) ([place]) 07/30/2011  . CAD (coronary artery disease)  05/10/2011  . Hyperlipidemia   . Unstable angina ([place]) 05/09/2011  . CERVICAL CANCER 12/14/2008  . History of malignant neoplasm of kidney 12/14/2008  . COPD (chronic obstructive pulmonary disease) ([place]) 12/14/2008  . IBS 12/14/2008  . Essential hypertension   . Personal history of colonic polyps 08/13/2008  . DISORDER, TOBACCO USE 08/04/2006  . Hypothyroidism   . Insulin dependent type 2 diabetes mellitus, controlled ([place])   . Lumbar disc disease   . ANXIETY STATE NOS 07/20/2006  . GERD 07/20/2006  . HX, PERSONAL, VENOUS THROMBOSIS/EMBOLISM 07/20/2006    Past Surgical History:  Procedure Laterality Date  . A-FLUTTER ABLATION N/A 12/21/2018   Procedure: A-FLUTTER ABLATION;  Surgeon: Deboraha Sprang, MD;  Location: [facility] CV LAB;  Service: Cardiovascular;  Laterality: N/A;  . ABDOMINAL AORTAGRAM N/A 08/21/2011   Procedure: ABDOMINAL Maxcine Ham;  Surgeon: Elam Dutch, MD;  Location: [facility] CATH LAB;  Service: Cardiovascular;  Laterality: N/A;  . ABDOMINAL AORTAGRAM N/A 03/16/2014   Procedure: ABDOMINAL Maxcine Ham;  Surgeon: Elam Dutch, MD;  Location: [facility] CATH LAB;  Service: Cardiovascular;  Laterality: N/A;  . ABDOMINAL HYSTERECTOMY    . APPENDECTOMY    . BLADDER SUSPENSION    . CARDIAC CATHETERIZATION    . CHOLECYSTECTOMY OPEN    . ESOPHAGOGASTRODUODENOSCOPY (EGD) WITH PROPOFOL N/A 05/16/2014   Procedure: ESOPHAGOGASTRODUODENOSCOPY (EGD) WITH PROPOFOL with Balloon dilation;  Surgeon: Milus Banister, MD;  Location: [facility];  Service: Endoscopy;  Laterality: N/A;  . ESOPHAGOGASTRODUODENOSCOPY (EGD) WITH PROPOFOL N/A 09/02/2015   Procedure: ESOPHAGOGASTRODUODENOSCOPY (EGD) WITH PROPOFOL;  Surgeon: Doran Stabler, MD;  Location: [facility];  Service: Gastroenterology;  Laterality: N/A;  . FEMORAL-POPLITEAL BYPASS GRAFT  10/12/2011   Procedure: BYPASS GRAFT FEMORAL-POPLITEAL ARTERY;  Surgeon: Elam Dutch, MD;  Location: [facility] OR;  Service: Vascular;  Laterality: Left;   Left Femoral-Popliteal Bypass Graft using 25m x 80cm Propaten Graft with intraop arteriogram times one.  . FEMORAL-POPLITEAL BYPASS GRAFT Left 04/17/2014   Procedure: REDO LEFT FEMORAL-POPLITEAL ARTERY BYPASS USING GORE PROPATEN 669m80cm GRAFT;  Surgeon: ChElam DutchMD;  [place] Service: [place];  Laterality: Left;  . FOOT SURGERY Left    "bone spurs"  . INTRAOPERATIVE ARTERIOGRAM  10/12/2011   Procedure: INTRA OPERATIVE ARTERIOGRAM;  Surgeon: ChElam DutchMD;  [place] [facility] Service: Vascular;  Laterality: Left;  . KNEE ARTHROSCOPY Bilateral    "cartilage"  . Laparascopic cryoablation of left kidney  08/2006   Dr. DaGladis Riffleor renal cell cancer  . LEFT HEART CATHETERIZATION WITH CORONARY ANGIOGRAM N/A 05/11/2011   Procedure: LEFT HEART CATHETERIZATION WITH CORONARY ANGIOGRAM;  Surgeon: DaJolaine ArtistMD;  [facility];  Service: Cardiovascular;  Laterality: N/A;  . LUNG SURGERY Right    lung nodule removed from the right side  . PR VEIN BYPASS GRAFT,AORTO-FEM-POP  05/27/2010  . SHOULDER ARTHROSCOPY Bilateral    'Spurs"  . TUBAL LIGATION       OB History  No obstetric history on file.      Home Medications    Prior to Admission medications   Medication Sig Start Date End Date Taking? Authorizing Provider  ACCU-CHEK FASTCLIX LANCETS MISC Use 4 per day to check blood sugar dx code E11.9 11/29/14   Elayne Snare, MD  amLODipine (NORVASC) 10 MG tablet Take 10 mg by mouth daily. 08/28/18   [provider]  aspirin EC 81 MG tablet Take 81 mg by mouth daily.    [provider]  Blood Glucose Monitoring Suppl (BAYER CONTOUR LINK 2.4) w/Device KIT by Does not apply route.    [provider]  cilostazol (PLETAL) 100 MG tablet TAKE 1 TABLET BY MOUTH TWICE A DAY Patient taking differently: Take 100 mg by mouth 2 (two) times daily.  08/25/18   Waynetta Sandy, MD  DULoxetine (CYMBALTA) 60 MG capsule Take 1 capsule (60 mg total)  by mouth daily. Patient taking differently: Take 60 mg by mouth at bedtime.  12/06/18   Elayne Snare, MD  ferrous sulfate 325 (65 FE) MG tablet Take 325 mg by mouth 2 (two) times daily.     [provider]  furosemide (LASIX) 20 MG tablet Take 1 tablet (20 mg total) by mouth every other day. 11/08/18 02/06/19  Richardson Dopp T, PA-C  gabapentin (NEURONTIN) 300 MG capsule TAKE 1 CAPSULE BY MOUTH TWICE DAILY 01/09/19   Elayne Snare, MD  glucose blood (CONTOUR NEXT TEST) test strip Use to test blood sugar 3 times daily E11.9 09/05/18   Elayne Snare, MD  HYDROcodone-acetaminophen (NORCO) 10-325 MG tablet Take 1 tablet by mouth 3 (three) times daily as needed (for pain). Follow up with PCP if refills needed Patient taking differently: Take 1 tablet by mouth 4 (four) times daily as needed (for pain).  04/24/17   Aline August, MD  insulin lispro (HUMALOG) 100 UNIT/ML injection Inject 0.7 mLs (70 Units total) into the skin See admin instructions. Use 200 units every 3 days in insulin pump Patient taking differently: Inject into the skin See admin instructions. Use 200 units every 3 days in insulin pump 09/15/18   Elayne Snare, MD  Insulin Pen Needle (BD PEN NEEDLE NANO U/F) 32G X 4 MM MISC Use to inject insulin 10/30/14   Elayne Snare, MD  lansoprazole (PREVACID) 30 MG capsule Take 1 capsule (30 mg total) by mouth daily. 05/11/16   Geradine Girt, DO  levothyroxine (SYNTHROID) 137 MCG tablet Take 1 tablet (137 mcg total) by mouth daily before breakfast. Patient taking differently: Take 125 mcg by mouth daily before breakfast.  12/09/18   Elayne Snare, MD  metoprolol succinate (TOPROL-XL) 50 MG 24 hr tablet Take 1.5 tablets (75 mg total) once a day Patient taking differently: Take 75 mg by mouth daily. Take 1.5 tablets (75 mg total) once a day 10/24/18   Richardson Dopp T, PA-C  ondansetron (ZOFRAN) 4 MG tablet Take 4 mg by mouth every 8 (eight) hours as needed for nausea.  10/12/18   [provider]   potassium chloride (K-DUR) 10 MEQ tablet Take 1 tablet (10 meq) twice a day, every other day Patient taking differently: Take 10 mEq by mouth daily.  11/08/18   Richardson Dopp T, PA-C  Rivaroxaban (XARELTO) 15 MG TABS tablet Take 1 tablet (15 mg total) by mouth daily with supper. 10/11/18   Donne Hazel, MD  rosuvastatin (CRESTOR) 20 MG tablet Take 20 mg by mouth daily. 09/13/18   [provider]  tolterodine (  DETROL LA) 4 MG 24 hr capsule Take 4 mg by mouth daily.     [provider]  triamcinolone cream (KENALOG) 0.1 % Apply 1 application topically 2 (two) times daily. 12/10/17   Kinnie Feil, PA-C  Vitamin D, Ergocalciferol, (DRISDOL) 1.25 MG (50000 UT) CAPS capsule Take 50,000 Units by mouth every 7 (seven) days. Monday 10/11/18   [provider]    Family History Family History  Problem Relation Age of Onset  . Heart disease Mother   . Lung cancer Mother   . Cancer Mother        Lung  . Heart disease Father   . Lung cancer Father   . Cancer Father        Lung  . Heart disease Sister        CABG- Open Heart    Social History Social History   Tobacco Use  . Smoking status: Current Every Day Smoker    Packs/day: 1.50    Years: 50.00    Pack years: 75.00    Types: Cigarettes    Last attempt to quit: 06/24/2014    Years since quitting: 4.5  . Smokeless tobacco: Never Used  . Tobacco comment: 08/15/2018 5 cigs a day  Substance Use Topics  . Alcohol use: No    Alcohol/week: 0.0 standard drinks  . Drug use: No     Allergies   Patient has no known allergies.   Review of Systems Review of Systems  All other systems reviewed and are negative.    Physical Exam Updated Vital Signs BP (!) 115/48   Pulse 71   Temp 98.3 F (36.8 C) (Oral)   Resp 18   SpO2 91%   Physical Exam Vitals signs and nursing note reviewed.  Constitutional:      General: She is not in acute distress.    Appearance: She is well-developed.  HENT:     Head:  Normocephalic and atraumatic.  Eyes:     Conjunctiva/sclera: Conjunctivae normal.  Neck:     Musculoskeletal: Neck supple.  Cardiovascular:     Rate and Rhythm: Normal rate and regular rhythm.     Heart sounds: No murmur.  Pulmonary:     Effort: Pulmonary effort is normal. No respiratory distress.     Comments: + crackles Abdominal:     Palpations: Abdomen is soft.     Tenderness: There is no abdominal tenderness.  Skin:    General: Skin is warm and dry.     Comments: No visible rash  Neurological:     Mental Status: She is alert.     Comments: Oriented to self, knows birthday, otherwise mostly unintelligible speech  Psychiatric:        Mood and Affect: Mood normal.        Behavior: Behavior normal.      ED Treatments / Results  Labs (all labs ordered are listed, but only abnormal results are displayed) Labs Reviewed  PROTIME-INR - Abnormal; Notable for the following components:      Result Value   Prothrombin Time 16.0 (*)    INR 1.3 (*)    All other components within normal limits  CBC - Abnormal; Notable for the following components:   WBC 15.4 (*)    RBC 3.44 (*)    Hemoglobin 9.1 (*)    HCT 29.3 (*)    All other components within normal limits  DIFFERENTIAL - Abnormal; Notable for the following components:   Neutro Abs 12.5 (*)  Monocytes Absolute 1.2 (*)    Abs Immature Granulocytes 0.16 (*)    All other components within normal limits  COMPREHENSIVE METABOLIC PANEL - Abnormal; Notable for the following components:   CO2 15 (*)    Glucose, Bld 111 (*)    BUN 55 (*)    Creatinine, Ser 2.94 (*)    Calcium 8.5 (*)    Total Bilirubin <0.1 (*)    GFR calc non Af Amer 15 (*)    GFR calc Af Amer 18 (*)    All other components within normal limits  CBG MONITORING, ED - Abnormal; Notable for the following components:   Glucose-Capillary 121 (*)    All other components within normal limits  CBG MONITORING, ED - Abnormal; Notable for the following components:    Glucose-Capillary 169 (*)    All other components within normal limits  SARS CORONAVIRUS 2 (TAT 6-24 HRS)  APTT  URINALYSIS, ROUTINE W REFLEX MICROSCOPIC  RAPID URINE DRUG SCREEN, HOSP PERFORMED  I-STAT VENOUS BLOOD GAS, ED    EKG EKG Interpretation  Date/Time:  Saturday January 14 2019 18:23:46 EST Ventricular Rate:  71 PR Interval:    QRS Duration: 102 QT Interval:  456 QTC Calculation: 495 R Axis:   99 Text Interpretation: Atrial flutter Rightward axis Low voltage QRS Incomplete right bundle branch block Nonspecific T wave abnormality Abnormal ECG Nonspecific ST and T wave abnormality Confirmed by Varney Biles [phone]) on 01/09/2019 10:17:25 PM   Radiology Ct Head Wo Contrast  Result Date: 01/21/2019 CLINICAL DATA:  Pt reports 2 days of aphasia, slurred speech and generalized weakness. Pt alert, oriented to self. EXAM: CT HEAD WITHOUT CONTRAST TECHNIQUE: Contiguous axial images were obtained from the base of the skull through the vertex without intravenous contrast. COMPARISON:  04/14/2017 FINDINGS: Brain: No evidence of acute infarction, hemorrhage, hydrocephalus, extra-axial collection or mass lesion/mass effect. Vascular: No hyperdense vessel or unexpected calcification. Skull: Normal. Negative for fracture or focal lesion. Sinuses/Orbits: Globes and orbits are unremarkable. Sinuses and mastoid air cells are clear. There changes from prior sinus surgery, stable. Other: None. IMPRESSION: Normal CT scan of the brain for age. Electronically Signed   By: Lajean Manes M.D.   On: 01/22/2019 19:13    Procedures .Critical Care Performed by: Montine Circle, PA-C Authorized by: Montine Circle, PA-C   Critical care provider statement:    Critical care time (minutes):  50   Critical care was necessary to treat or prevent imminent or life-threatening deterioration of the following conditions:  Renal failure, respiratory failure and dehydration   Critical care was time spent  personally by me on the following activities:  Discussions with consultants, evaluation of patient's response to treatment, examination of patient, ordering and performing treatments and interventions, ordering and review of laboratory studies, ordering and review of radiographic studies, pulse oximetry, re-evaluation of patient's condition, obtaining history from patient or surrogate and review of old charts     Medications Ordered in ED Medications  sodium chloride flush (NS) 0.9 % injection 3 mL (has no administration in time range)     Initial Impression / Assessment and Plan / ED Course  I have reviewed the triage vital signs and the nursing notes.  Pertinent labs & imaging results that were available during my care of the patient were reviewed by me and considered in my medical decision making (see chart for details).        Presents to the emergency department with altered mental status and confusion.  Per the patient's significant other, the patient stopped talking yesterday and was not walking.  History difficult to obtain from significant other due to language barrier and telephone conversation, so no translator available.  10:59 PM Rectal temp low, 96.9.  Leukocytosis 15.4.  Possible source of infection on CXR. AKI, Cr is significantly up from baseline at 2.94.  Code sepsis activated, empiric antibiotics and fluids given.  Patient seen by discussed with Dr. Leonides Schanz, who reports that after the patient received some Narcan she is much more alert.  High degree of suspicion for polypharmacy.  She does have an AKI, which appears to be acute.  Will need admission for this.  Recheck of glucose is 47, will give dextrose. CBG after dextrose is 187.  ABG is 7.28, likely hypercapnic from decreased respiratory drive secondary to opiates.  Appreciate Dr. Maudie Mercury for admitting the patient.  Covid swab pending.  Patient denies cough or fever.  Final Clinical Impressions(s) / ED Diagnoses    Final diagnoses:  Altered mental status, unspecified altered mental status type  Polypharmacy  AKI (acute kidney injury) [facility])  Dehydration    ED Discharge Orders    None       Montine Circle, PA-C February 06, 2019 0206    Ward, Delice Bison, DO Feb 06, 2019 1007

## 2019-01-14 NOTE — ED Notes (Signed)
Pt noted to be lethargic. She followed some commands but not all.

## 2019-01-14 NOTE — ED Triage Notes (Signed)
Pt reports 2 days of aphasia, slurred speech and generalized weakness. Pt alert, oriented to self. Unable to assess for unilateral weakness in triage, pt unable to follow commands as well due to generalized weakness. Pt accompanied by friend.

## 2019-01-15 DIAGNOSIS — R4182 Altered mental status, unspecified: Secondary | ICD-10-CM

## 2019-01-15 DIAGNOSIS — R748 Abnormal levels of other serum enzymes: Secondary | ICD-10-CM | POA: Diagnosis present

## 2019-01-15 LAB — POCT I-STAT 7, (LYTES, BLD GAS, ICA,H+H)
Acid-base deficit: 9 mmol/L — ABNORMAL HIGH (ref 0.0–2.0)
Bicarbonate: 17.1 mmol/L — ABNORMAL LOW (ref 20.0–28.0)
Calcium, Ion: 1.16 mmol/L (ref 1.15–1.40)
HCT: 27 % — ABNORMAL LOW (ref 36.0–46.0)
Hemoglobin: 9.2 g/dL — ABNORMAL LOW (ref 12.0–15.0)
O2 Saturation: 77 %
Patient temperature: 96.9
Potassium: 3.6 mmol/L (ref 3.5–5.1)
Sodium: 139 mmol/L (ref 135–145)
TCO2: 18 mmol/L — ABNORMAL LOW (ref 22–32)
pCO2 arterial: 35.9 mmHg (ref 32.0–48.0)
pH, Arterial: 7.282 — ABNORMAL LOW (ref 7.350–7.450)
pO2, Arterial: 44 mmHg — ABNORMAL LOW (ref 83.0–108.0)

## 2019-01-15 LAB — CBG MONITORING, ED
Glucose-Capillary: 126 mg/dL — ABNORMAL HIGH (ref 70–99)
Glucose-Capillary: 187 mg/dL — ABNORMAL HIGH (ref 70–99)
Glucose-Capillary: 195 mg/dL — ABNORMAL HIGH (ref 70–99)
Glucose-Capillary: 47 mg/dL — ABNORMAL LOW (ref 70–99)

## 2019-01-15 LAB — SARS CORONAVIRUS 2 (TAT 6-24 HRS): SARS Coronavirus 2: NEGATIVE

## 2019-01-15 LAB — ETHANOL: Alcohol, Ethyl (B): <10 mg/dL (ref <10)

## 2019-01-15 LAB — LACTIC ACID, PLASMA: Lactic Acid, Venous: 0.9 mmol/L (ref 0.5–1.9)

## 2019-01-15 LAB — ACETAMINOPHEN LEVEL: Acetaminophen (Tylenol), Serum: <10 ug/mL — ABNORMAL LOW (ref 10–30)

## 2019-01-15 LAB — TROPONIN I (HIGH SENSITIVITY): Troponin I (High Sensitivity): 14 ng/L (ref <18)

## 2019-01-15 LAB — BRAIN NATRIURETIC PEPTIDE: B Natriuretic Peptide: 585.8 pg/mL — ABNORMAL HIGH (ref 0.0–100.0)

## 2019-01-15 LAB — CK: Total CK: 294 U/L — ABNORMAL HIGH (ref 38–234)

## 2019-01-15 LAB — STREP PNEUMONIAE URINARY ANTIGEN: Strep Pneumo Urinary Antigen: NEGATIVE

## 2019-01-15 LAB — AMMONIA: Ammonia: 22 umol/L (ref 9–35)

## 2019-01-15 LAB — SALICYLATE LEVEL: Salicylate Lvl: <7 mg/dL (ref 2.8–30.0)

## 2019-01-15 MED ORDER — GABAPENTIN 300 MG PO CAPS: 300.0000 mg | ORAL_CAPSULE | Freq: Two times a day (BID) | ORAL | Status: DC

## 2019-01-15 MED ORDER — ACETAMINOPHEN 325 MG PO TABS: 650.0000 mg | ORAL_TABLET | Freq: Four times a day (QID) | ORAL | Status: DC | PRN

## 2019-01-15 MED ORDER — NALOXONE HCL 2 MG/2ML IJ SOSY
PREFILLED_SYRINGE | INTRAMUSCULAR | Status: AC | PRN
  Administered 2019-01-15: 2 mg via INTRAVENOUS

## 2019-01-15 MED ORDER — METOPROLOL SUCCINATE ER 25 MG PO TB24: 75.0000 mg | ORAL_TABLET | Freq: Every day | ORAL | Status: DC

## 2019-01-15 MED ORDER — ENOXAPARIN SODIUM 30 MG/0.3ML ~~LOC~~ SOLN: 30.0000 mg | Freq: Every day | SUBCUTANEOUS | Status: DC

## 2019-01-15 MED ORDER — LEVOTHYROXINE SODIUM 25 MCG PO TABS: 125.0000 ug | ORAL_TABLET | Freq: Every day | ORAL | Status: DC

## 2019-01-15 MED ORDER — INSULIN ASPART 100 UNIT/ML ~~LOC~~ SOLN: 0.0000 [IU] | Freq: Three times a day (TID) | SUBCUTANEOUS | Status: DC

## 2019-01-15 MED ORDER — DULOXETINE HCL 60 MG PO CPEP: 60.0000 mg | ORAL_CAPSULE | Freq: Every day | ORAL | Status: DC

## 2019-01-15 MED ORDER — VANCOMYCIN HCL IN DEXTROSE 750-5 MG/150ML-% IV SOLN: 750.0000 mg | INTRAVENOUS | Status: DC

## 2019-01-15 MED ORDER — CILOSTAZOL 100 MG PO TABS
100.0000 mg | ORAL_TABLET | Freq: Two times a day (BID) | ORAL | Status: DC
  Filled 2019-01-15 (×2): qty 1

## 2019-01-15 MED ORDER — FUROSEMIDE 20 MG PO TABS: 20.0000 mg | ORAL_TABLET | ORAL | Status: DC

## 2019-01-15 MED ORDER — EPINEPHRINE 1 MG/10ML IJ SOSY
PREFILLED_SYRINGE | INTRAMUSCULAR | Status: AC | PRN
  Administered 2019-01-15 (×4): 1 mg via INTRAVENOUS

## 2019-01-15 MED ORDER — FESOTERODINE FUMARATE ER 4 MG PO TB24
4.0000 mg | ORAL_TABLET | Freq: Every day | ORAL | Status: DC
  Filled 2019-01-15: qty 1

## 2019-01-15 MED ORDER — ONDANSETRON HCL 4 MG PO TABS: 4.0000 mg | ORAL_TABLET | Freq: Three times a day (TID) | ORAL | Status: DC | PRN

## 2019-01-15 MED ORDER — SODIUM BICARBONATE 8.4 % IV SOLN
INTRAVENOUS | Status: AC | PRN
  Administered 2019-01-15 (×2): 100 meq via INTRAVENOUS

## 2019-01-15 MED ORDER — LACTATED RINGERS IV SOLN: INTRAVENOUS | Status: DC

## 2019-01-15 MED ORDER — AMLODIPINE BESYLATE 5 MG PO TABS: 10.0000 mg | ORAL_TABLET | Freq: Every day | ORAL | Status: DC

## 2019-01-15 MED ORDER — SODIUM CHLORIDE 0.9 % IV SOLN: 2.0000 g | Freq: Every day | INTRAVENOUS | Status: DC

## 2019-01-15 MED ORDER — ASPIRIN EC 81 MG PO TBEC: 81.0000 mg | DELAYED_RELEASE_TABLET | Freq: Every day | ORAL | Status: DC

## 2019-01-15 MED ORDER — RIVAROXABAN 15 MG PO TABS
15.0000 mg | ORAL_TABLET | Freq: Every day | ORAL | Status: DC
  Filled 2019-01-15: qty 1

## 2019-01-15 MED ORDER — ONDANSETRON HCL 4 MG/2ML IJ SOLN
4.0000 mg | Freq: Once | INTRAMUSCULAR | Status: AC
  Administered 2019-01-15: 01:00:00 4 mg via INTRAVENOUS
  Filled 2019-01-15: qty 2

## 2019-01-15 MED ORDER — SODIUM CHLORIDE 0.9 % IV SOLN
INTRAVENOUS | Status: AC
  Administered 2019-01-15: 04:00:00 via INTRAVENOUS

## 2019-01-15 MED ORDER — NALOXONE HCL 2 MG/2ML IJ SOSY
PREFILLED_SYRINGE | INTRAMUSCULAR | Status: AC
  Filled 2019-01-15: qty 2

## 2019-01-15 MED ORDER — DEXTROSE 50 % IV SOLN
1.0000 | Freq: Once | INTRAVENOUS | Status: AC
  Administered 2019-01-15: 50 mL via INTRAVENOUS
  Filled 2019-01-15: qty 50

## 2019-01-15 MED ORDER — POTASSIUM CHLORIDE CRYS ER 10 MEQ PO TBCR: 10.0000 meq | EXTENDED_RELEASE_TABLET | Freq: Every day | ORAL | Status: DC

## 2019-01-15 MED ORDER — PANTOPRAZOLE SODIUM 40 MG PO TBEC: 40.0000 mg | DELAYED_RELEASE_TABLET | Freq: Every day | ORAL | Status: DC

## 2019-01-15 MED ORDER — ROSUVASTATIN CALCIUM 20 MG PO TABS
20.0000 mg | ORAL_TABLET | Freq: Every day | ORAL | Status: DC
  Filled 2019-01-15: qty 1

## 2019-01-15 MED ORDER — FERROUS SULFATE 325 (65 FE) MG PO TABS: 325.0000 mg | ORAL_TABLET | Freq: Two times a day (BID) | ORAL | Status: DC

## 2019-01-15 MED ORDER — ACETAMINOPHEN 650 MG RE SUPP: 650.0000 mg | Freq: Four times a day (QID) | RECTAL | Status: DC | PRN

## 2019-01-15 MED ORDER — NALOXONE HCL 2 MG/2ML IJ SOSY
1.0000 mg | PREFILLED_SYRINGE | Freq: Once | INTRAMUSCULAR | Status: AC
  Administered 2019-01-15: 01:00:00 1 mg via INTRAVENOUS
  Filled 2019-01-15: qty 2

## 2019-01-15 MED ORDER — SODIUM BICARBONATE 8.4 % IV SOLN
INTRAVENOUS | Status: AC | PRN
  Administered 2019-01-15: 100 meq via INTRAVENOUS

## 2019-01-15 MED ORDER — INSULIN ASPART 100 UNIT/ML ~~LOC~~ SOLN: 0.0000 [IU] | Freq: Every day | SUBCUTANEOUS | Status: DC

## 2019-01-15 MED FILL — Medication: Qty: 1 | Status: AC

## 2019-01-16 ENCOUNTER — Ambulatory Visit: Payer: Medicare Other | Admitting: Endocrinology

## 2019-01-16 LAB — LEGIONELLA PNEUMOPHILA SEROGP 1 UR AG: L. pneumophila Serogp 1 Ur Ag: NEGATIVE

## 2019-01-16 LAB — URINE CULTURE: Culture: NO GROWTH

## 2019-01-20 LAB — CULTURE, BLOOD (ROUTINE X 2)
Culture: NO GROWTH
Culture: NO GROWTH
Special Requests: ADEQUATE
Special Requests: ADEQUATE

## 2019-01-24 ENCOUNTER — Ambulatory Visit: Payer: Medicare Other | Admitting: Internal Medicine

## 2019-01-24 NOTE — Progress Notes (Signed)
Chaplain responded to a code Blue. Chaplain stood by for availability and staff support. No family was presence. Chaplain was discharged by Rn.   2019-01-26 0700  Clinical Encounter Type  Visited With Health care provider  Visit Type Code  Referral From Care management  Spiritual Encounters  Spiritual Needs Emotional

## 2019-01-24 NOTE — ED Notes (Signed)
Pt told the pharmacy tech that she could not breathe and the pharmacy tech advised this RN. This RN went into the pt's room and she was slouched down in the bed complaining she could not breathe. Repositioned the pt in the bed and she reported she already felt better and could breathe. Reminded the pt to keep herself on all equipment and to call out when needed. Pt verbalized understanding.

## 2019-01-24 NOTE — ED Notes (Signed)
Pt removed herself off all equipment and upon doing hourly rounding placed pt back on all of her equipment to include cardiac monitor, blood pressure cuff, and oxygen saturation probe. Verbalized to pt that this equipment was necessary for her care and she verbalized back I understand.

## 2019-01-24 NOTE — ED Notes (Signed)
Pt removed herself off all equipment AGAIN and upon doing hourly rounding placed pt back on all of her equipment to include cardiac monitor, blood pressure cuff, and oxygen saturation probe. Pt's vital signs were stable as noted. Pt had to continously be reminded to keep all wires and equipment on. Verbalized to pt that this equipment was necessary for her care and she verbalized back I understand.

## 2019-01-24 NOTE — H&P (Addendum)
TRH H&P    Patient Demographics:    April Branch, is a 73 y.o. female  MRN: [id number]  DOB - 12/17/1945  Admit Date - 01/18/2019  Referring MD/NP/PA:  Georgann Housekeeper  Outpatient Primary MD for the patient is [name], MD  Patient coming from:   home  Chief complaint- altered mental status   HPI:    April Branch  is a 73 y.o. female, w hypothyroidism, gerd,  hypertension, hyperlipidemia, Dm2,  CAD, Aflutter, h/o TIA/ CVA, Copd not on home o2, apparently presents with altered mental status.  Pt was given narcan in the ED with good response.    In ED T 98.2, P 71 R 18, Bp 115/48  Pox 91% on RA  CT brain IMPRESSION: Normal CT scan of the brain for age.  CXR IMPRESSION: 1. Cardiomegaly with diffuse pulmonary interstitial edema. 2. Superimposed patchy opacities within the right mid and lower lung, which could reflect atelectasis or infiltrates. 3.  Aortic Atherosclerosis (ICD10-I70.0).  Wbc 15.4, Hgb 9.1, Plt 317 Na 136,  3.5,  Bun 55, Creatinin e2.94 Ast 21, Alt 16 Glucose 111, Hco3 15, AG 15 Ammonia 22 Lactic acid 0.9  Urinalysis rbc 11-20, wbc 0-5  UDS opiate positive  Pt will be admitted for AMS secondary to [place], and ARF, and Hcap     Review of systems:    In addition to the HPI above,  No Fever-chills, No Headache, No changes with Vision or hearing, No problems swallowing food or Liquids, No Chest pain,   No Abdominal pain, No Nausea or Vomiting, bowel movements are regular, No Blood in stool or Urine, No dysuria, No new skin rashes or bruises, No new joints pains-aches,  No new weakness, tingling, numbness in any extremity, No recent weight gain or loss, No polyuria, polydypsia or polyphagia, No significant Mental Stressors.  All other systems reviewed and are negative.    Past History of the following :    Past Medical History:  Diagnosis Date  . Anemia    . Anxiety   . Arthritis   . CAD (coronary artery disease) 2016   non-obstructive at cath  . Colon polyps 09/11/2010   Tubular adenoma  . COPD 12/14/2008   PATIENT DENIES    . Depression   . Diabetic coma ([place]) Feb. 2014  . Fall at home 2016   x 2 in January 2016, Left knee  . Fibromyalgia   . GERD 07/20/2006  . Headache(784.0)   . History of transient ischemic attack (TIA)   . HPV (human papilloma virus) infection   . Hyperlipidemia   . Hypertension   . Hypothyroidism   . Insulin dependent type 2 diabetes mellitus, controlled ([place]) 1989  . Lumbar disc disease   . Neuromuscular disorder ([place])    PERIPHERAL NEUROPATHY  . Peripheral vascular disease ([place])   . Personal history of malignant neoplasm of kidney(V10.52) 12/14/2008   Laparoscopic biopsy and cryoablation 7/08 Dr. Vernie Shanks   . Renal disorder    chronic kidney disease   . Stroke [facility])  3 MINI STROKES  RT SIDED WEAKNESS  . Vertigo       Past Surgical History:  Procedure Laterality Date  . A-FLUTTER ABLATION N/A 12/21/2018   Procedure: A-FLUTTER ABLATION;  Surgeon: Deboraha Sprang, MD;  Location: [place] CV LAB;  Service: Cardiovascular;  Laterality: N/A;  . ABDOMINAL AORTAGRAM N/A 08/21/2011   Procedure: ABDOMINAL Maxcine Ham;  Surgeon: Elam Dutch, MD;  Location: [facility] CATH LAB;  Service: Cardiovascular;  Laterality: N/A;  . ABDOMINAL AORTAGRAM N/A 03/16/2014   Procedure: ABDOMINAL Maxcine Ham;  Surgeon: Elam Dutch, MD;  Location: [facility] CATH LAB;  Service: Cardiovascular;  Laterality: N/A;  . ABDOMINAL HYSTERECTOMY    . APPENDECTOMY    . BLADDER SUSPENSION    . CARDIAC CATHETERIZATION    . CHOLECYSTECTOMY OPEN    . ESOPHAGOGASTRODUODENOSCOPY (EGD) WITH PROPOFOL N/A 05/16/2014   Procedure: ESOPHAGOGASTRODUODENOSCOPY (EGD) WITH PROPOFOL with Balloon dilation;  Surgeon: Milus Banister, MD;  Location: [place];  Service: Endoscopy;  Laterality: N/A;  . ESOPHAGOGASTRODUODENOSCOPY (EGD) WITH PROPOFOL N/A  09/02/2015   Procedure: ESOPHAGOGASTRODUODENOSCOPY (EGD) WITH PROPOFOL;  Surgeon: Doran Stabler, MD;  Location: [place];  Service: Gastroenterology;  Laterality: N/A;  . FEMORAL-POPLITEAL BYPASS GRAFT  10/12/2011   Procedure: BYPASS GRAFT FEMORAL-POPLITEAL ARTERY;  Surgeon: Elam Dutch, MD;  [facility] OR;  Service: Vascular;  Laterality: Left;  Left Femoral-Popliteal Bypass Graft using 2m x 80cm Propaten Graft with intraop arteriogram times one.  . FEMORAL-POPLITEAL BYPASS GRAFT Left 04/17/2014   Procedure: REDO LEFT FEMORAL-POPLITEAL ARTERY BYPASS USING GORE PROPATEN 676m80cm GRAFT;  Surgeon: ChElam DutchMD;  [facility] Service: Vascular;  Laterality: Left;  . FOOT SURGERY Left    "bone spurs"  . INTRAOPERATIVE ARTERIOGRAM  10/12/2011   Procedure: INTRA OPERATIVE ARTERIOGRAM;  Surgeon: ChElam DutchMD;  [facility] Service: [facility];  Laterality: Left;  . KNEE ARTHROSCOPY Bilateral    "cartilage"  . Laparascopic cryoablation of left kidney  08/2006   Dr. DaGladis Riffleor renal cell cancer  . LEFT HEART CATHETERIZATION WITH CORONARY ANGIOGRAM N/A 05/11/2011   Procedure: LEFT HEART CATHETERIZATION WITH CORONARY ANGIOGRAM;  Surgeon: DaJolaine ArtistMD;  [facility];  Service: Cardiovascular;  Laterality: N/A;  . LUNG SURGERY Right    lung nodule removed from the right side  . PR VEIN BYPASS GRAFT,AORTO-FEM-POP  05/27/2010  . SHOULDER ARTHROSCOPY Bilateral    'Spurs"  . TUBAL LIGATION        Social History:      Social History   Tobacco Use  . Smoking status: Current Every Day Smoker    Packs/day: 1.50    Years: 50.00    Pack years: 75.00    Types: Cigarettes    Last attempt to quit: 06/24/2014    Years since quitting: 4.5  . Smokeless tobacco: Never Used  . Tobacco comment: 08/15/2018 5 cigs a day  Substance Use Topics  . Alcohol use: No    Alcohol/week: 0.0 standard drinks       Family History :     Family History  Problem  Relation Age of Onset  . Heart disease Mother   . Lung cancer Mother   . Cancer Mother        Lung  . Heart disease Father   . Lung cancer Father   . Cancer Father        Lung  . Heart disease Sister        CABG-  Open Heart       Home Medications:   Prior to Admission medications   Medication Sig Start Date End Date Taking? Authorizing Provider  ACCU-CHEK FASTCLIX LANCETS MISC Use 4 per day to check blood sugar dx code E11.9 11/29/14   Elayne Snare, MD  amLODipine (NORVASC) 10 MG tablet Take 10 mg by mouth daily. 08/28/18   [provider]  aspirin EC 81 MG tablet Take 81 mg by mouth daily.    [provider]  Blood Glucose Monitoring Suppl (BAYER CONTOUR LINK 2.4) w/Device KIT by Does not apply route.    [provider]  cilostazol (PLETAL) 100 MG tablet TAKE 1 TABLET BY MOUTH TWICE A DAY Patient taking differently: Take 100 mg by mouth 2 (two) times daily.  08/25/18   Waynetta Sandy, MD  DULoxetine (CYMBALTA) 60 MG capsule Take 1 capsule (60 mg total) by mouth daily. Patient taking differently: Take 60 mg by mouth at bedtime.  12/06/18   Elayne Snare, MD  ferrous sulfate 325 (65 FE) MG tablet Take 325 mg by mouth 2 (two) times daily.     [provider]  furosemide (LASIX) 20 MG tablet Take 1 tablet (20 mg total) by mouth every other day. 11/08/18 02/06/19  Richardson Dopp T, PA-C  gabapentin (NEURONTIN) 300 MG capsule TAKE 1 CAPSULE BY MOUTH TWICE DAILY 01/09/19   Elayne Snare, MD  glucose blood (CONTOUR NEXT TEST) test strip Use to test blood sugar 3 times daily E11.9 09/05/18   Elayne Snare, MD  HYDROcodone-acetaminophen (NORCO) 10-325 MG tablet Take 1 tablet by mouth 3 (three) times daily as needed (for pain). Follow up with PCP if refills needed Patient taking differently: Take 1 tablet by mouth 4 (four) times daily as needed (for pain).  04/24/17   Aline August, MD  insulin lispro (HUMALOG) 100 UNIT/ML injection Inject 0.7 mLs (70 Units total)  into the skin See admin instructions. Use 200 units every 3 days in insulin pump Patient taking differently: Inject into the skin See admin instructions. Use 200 units every 3 days in insulin pump 09/15/18   Elayne Snare, MD  Insulin Pen Needle (BD PEN NEEDLE NANO U/F) 32G X 4 MM MISC Use to inject insulin 10/30/14   Elayne Snare, MD  lansoprazole (PREVACID) 30 MG capsule Take 1 capsule (30 mg total) by mouth daily. 05/11/16   Geradine Girt, DO  levothyroxine (SYNTHROID) 137 MCG tablet Take 1 tablet (137 mcg total) by mouth daily before breakfast. Patient taking differently: Take 125 mcg by mouth daily before breakfast.  12/09/18   Elayne Snare, MD  metoprolol succinate (TOPROL-XL) 50 MG 24 hr tablet Take 1.5 tablets (75 mg total) once a day Patient taking differently: Take 75 mg by mouth daily. Take 1.5 tablets (75 mg total) once a day 10/24/18   Richardson Dopp T, PA-C  ondansetron (ZOFRAN) 4 MG tablet Take 4 mg by mouth every 8 (eight) hours as needed for nausea.  10/12/18   [provider]  potassium chloride (K-DUR) 10 MEQ tablet Take 1 tablet (10 meq) twice a day, every other day Patient taking differently: Take 10 mEq by mouth daily.  11/08/18   Richardson Dopp T, PA-C  Rivaroxaban (XARELTO) 15 MG TABS tablet Take 1 tablet (15 mg total) by mouth daily with supper. 10/11/18   Donne Hazel, MD  rosuvastatin (CRESTOR) 20 MG tablet Take 20 mg by mouth daily. 09/13/18   [provider]  tolterodine (DETROL LA) 4 MG  24 hr capsule Take 4 mg by mouth daily.     [provider]  triamcinolone cream (KENALOG) 0.1 % Apply 1 application topically 2 (two) times daily. 12/10/17   Kinnie Feil, PA-C  Vitamin D, Ergocalciferol, (DRISDOL) 1.25 MG (50000 UT) CAPS capsule Take 50,000 Units by mouth every 7 (seven) days. Monday 10/11/18   [provider]     Allergies:    No Known Allergies   Physical Exam:   Vitals  Blood pressure (!) 112/42, pulse 76, temperature (!) 96.9  F (36.1 C), temperature source Rectal, resp. rate 20, SpO2 (!) 86 %.  1.  General: axoxo3  2. Psychiatric: euthymic  3. Neurologic: Nonfocal, cn2-12 intact, reflexes 2+ symmetric, diffuse with no clonus, motor 5/5 in all 4 ext  4. HEENMT:  Anicteric, pupils 44m symmetric, direct, consensual intact Neck: no jvd  5. Respiratory : Few crackles at the right lung base, no wheezing  6. Cardiovascular : rrr s1, s2, no m/g/r,    7. Gastrointestinal:  Abd: soft, nt, nd, +bs  8. Skin:  Ext: no c/c/e, no rash  9.Musculoskeletal:  Good ROM    Data Review:    CBC Recent Labs  Lab 01/19/2019 1838 01/12/2019 2314 [id number]  WBC 15.4*  --   --   HGB 9.1* 9.9* 9.2*  HCT 29.3* 29.0* 27.0*  PLT 317  --   --   MCV 85.2  --   --   MCH 26.5  --   --   MCHC 31.1  --   --   RDW 15.4  --   --   LYMPHSABS 1.5  --   --   MONOABS 1.2*  --   --   EOSABS 0.0  --   --   BASOSABS 0.0  --   --    ------------------------------------------------------------------------------------------------------------------  Results for orders placed or performed during the hospital encounter of 01/16/2019 (from the past 48 hour(s))  CBG monitoring, ED     Status: Abnormal   Collection Time: 01/07/2019  6:30 PM  Result Value Ref Range   Glucose-Capillary 121 (H) 70 - 99 mg/dL  Protime-INR     Status: Abnormal   Collection Time: 01/12/2019  6:38 PM  Result Value Ref Range   Prothrombin Time 16.0 (H) 11.4 - 15.2 seconds   INR 1.3 (H) 0.8 - 1.2    Comment: (NOTE) INR goal varies based on device and disease states. Performed at [facility] [address], [place] [address]  APTT     Status: None   Collection Time: 01/21/2019  6:38 PM  Result Value Ref Range   aPTT 31 24 - 36 seconds    Comment: Performed at [facility] [place] [address] CBC     Status: Abnormal   Collection Time: 01/17/2019  6:38 PM  Result Value Ref Range   WBC 15.4 (H) 4.0 - 10.5  K/uL   RBC 3.44 (L) 3.87 - 5.11 MIL/uL   Hemoglobin 9.1 (L) 12.0 - 15.0 g/dL   HCT 29.3 (L) 36.0 - 46.0 %   MCV 85.2 80.0 - 100.0 fL   MCH 26.5 26.0 - 34.0 pg   MCHC 31.1 30.0 - 36.0 g/dL   RDW 15.4 11.5 - 15.5 %   Platelets 317 150 - 400 K/uL   nRBC 0.1 0.0 - 0.2 %    Comment: Performed at [facility] [address],  [place], [place] [address]  Differential     Status: Abnormal   Collection Time: 01/19/2019  6:38 PM  Result Value Ref Range   Neutrophils Relative % 81 %   Neutro Abs 12.5 (H) 1.7 - 7.7 K/uL   Lymphocytes Relative 10 %   Lymphs Abs 1.5 0.7 - 4.0 K/uL   Monocytes Relative 8 %   Monocytes Absolute 1.2 (H) 0.1 - 1.0 K/uL   Eosinophils Relative 0 %   Eosinophils Absolute 0.0 0.0 - 0.5 K/uL   Basophils Relative 0 %   Basophils Absolute 0.0 0.0 - 0.1 K/uL   Immature Granulocytes 1 %   Abs Immature Granulocytes 0.16 (H) 0.00 - 0.07 K/uL    Comment: Performed at [facility] [address], [place], [place] [address]  Comprehensive metabolic panel     Status: Abnormal   Collection Time: 01/06/2019  6:38 PM  Result Value Ref Range   Sodium 136 135 - 145 mmol/L   Potassium 3.5 3.5 - 5.1 mmol/L   Chloride 106 98 - 111 mmol/L   CO2 15 (L) 22 - 32 mmol/L   Glucose, Bld 111 (H) 70 - 99 mg/dL   BUN 55 (H) 8 - 23 mg/dL   Creatinine, Ser 2.94 (H) 0.44 - 1.00 mg/dL   Calcium 8.5 (L) 8.9 - 10.3 mg/dL   Total Protein 6.8 6.5 - 8.1 g/dL   Albumin 3.5 3.5 - 5.0 g/dL   AST 21 15 - 41 U/L   ALT 16 0 - 44 U/L   Alkaline Phosphatase 86 38 - 126 U/L   Total Bilirubin <0.1 (L) 0.3 - 1.2 mg/dL   GFR calc non Af Amer 15 (L) >60 mL/min   GFR calc Af Amer 18 (L) >60 mL/min   Anion gap 15 5 - 15    Comment: Performed at [facility], [place] [address], [place], [place] [address]  CBG monitoring, ED     Status: Abnormal   Collection Time: 01/11/2019  9:52 PM  Result Value Ref Range   Glucose-Capillary 169 (H) 70 - 99 mg/dL  Lactic acid     Status: None   Collection Time:  01/06/2019 10:26 PM  Result Value Ref Range   Lactic Acid, Venous 0.9 0.5 - 1.9 mmol/L    Comment: Performed at [facility] [address] [place], [place] [address]  Urinalysis, Routine w reflex microscopic     Status: Abnormal   Collection Time: 01/06/2019 10:51 PM  Result Value Ref Range   Color, Urine YELLOW YELLOW   APPearance HAZY (A) CLEAR   Specific Gravity, Urine 1.015 1.005 - 1.030   pH 5.0 5.0 - 8.0   Glucose, UA NEGATIVE NEGATIVE mg/dL   Hgb urine dipstick MODERATE (A) NEGATIVE   Bilirubin Urine NEGATIVE NEGATIVE   Ketones, ur NEGATIVE NEGATIVE mg/dL   Protein, ur 30 (A) NEGATIVE mg/dL   Nitrite NEGATIVE NEGATIVE   Leukocytes,Ua NEGATIVE NEGATIVE   RBC / HPF 11-20 0 - 5 RBC/hpf   WBC, UA 0-5 0 - 5 WBC/hpf   Bacteria, UA NONE SEEN NONE SEEN   Squamous Epithelial / LPF 0-5 0 - 5   Mucus PRESENT     Comment: Performed at [facility], [place] [address], [place],  [address]  Rapid urine drug screen (hospital performed)     Status: Abnormal   Collection Time: 12/28/2018 10:51 PM  Result Value Ref Range   Opiates POSITIVE (A) NONE DETECTED  Cocaine NONE DETECTED NONE DETECTED   Benzodiazepines NONE DETECTED NONE DETECTED   Amphetamines NONE DETECTED NONE DETECTED   Tetrahydrocannabinol NONE DETECTED NONE DETECTED   Barbiturates NONE DETECTED NONE DETECTED    Comment: (NOTE) DRUG SCREEN FOR MEDICAL PURPOSES ONLY.  IF CONFIRMATION IS NEEDED FOR ANY PURPOSE, NOTIFY LAB WITHIN 5 DAYS. LOWEST DETECTABLE LIMITS FOR URINE DRUG SCREEN Drug Class                     Cutoff (ng/mL) Amphetamine and metabolites    1000 Barbiturate and metabolites    200 Benzodiazepine                 623 Tricyclics and metabolites     300 Opiates and metabolites        300 Cocaine and metabolites        300 THC                            50 Performed at [facility], [place] [address] [place], [place] [address]   POCT I-Stat EG7     Status: Abnormal   Collection Time:  01/19/2019 11:14 PM  Result Value Ref Range   pH, Ven 7.386 7.250 - 7.430   pCO2, Ven 29.9 (L) 44.0 - 60.0 mmHg   pO2, Ven 96.0 (H) 32.0 - 45.0 mmHg   Bicarbonate 17.9 (L) 20.0 - 28.0 mmol/L   TCO2 19 (L) 22 - 32 mmol/L   O2 Saturation 98.0 %   Acid-base deficit 6.0 (H) 0.0 - 2.0 mmol/L   Sodium 139 135 - 145 mmol/L   Potassium 3.6 3.5 - 5.1 mmol/L   Calcium, Ion 1.10 (L) 1.15 - 1.40 mmol/L   HCT 29.0 (L) 36.0 - 46.0 %   Hemoglobin 9.9 (L) 12.0 - 15.0 g/dL   Patient temperature HIDE    Sample type VENOUS   Acetaminophen level     Status: Abnormal   Collection Time: 01/19/2019 11:28 PM  Result Value Ref Range   Acetaminophen (Tylenol), Serum <10 (L) 10 - 30 ug/mL    Comment: (NOTE) Therapeutic concentrations vary significantly. A range of 10-30 ug/mL  may be an effective concentration for many patients. However, some  are best treated at concentrations outside of this range. Acetaminophen concentrations >150 ug/mL at 4 hours after ingestion  and >50 ug/mL at 12 hours after ingestion are often associated with  toxic reactions. Performed at [facility], [place] [address] [place], [place] [address]   Salicylate level     Status: None   Collection Time: 01/18/2019 11:28 PM  Result Value Ref Range   Salicylate Lvl <1.6 2.8 - 30.0 mg/dL    Comment: Performed at [facility] [address] [place], [place] [address]  Ethanol     Status: None   Collection Time: 01/02/2019 11:28 PM  Result Value Ref Range   Alcohol, Ethyl (B) <10 <10 mg/dL    Comment: (NOTE) Lowest detectable limit for serum alcohol is 10 mg/dL. For medical purposes only. Performed at [facility], [place] [address] [place], [place] [address]   Ammonia     Status: None   Collection Time: 12/25/2018 11:28 PM  Result Value Ref Range   Ammonia 22 9 - 35 umol/L    Comment: Performed at [facility], [place] [address], [place], [place] [address]  CBG monitoring, ED  Status: Abnormal   Collection Time:  01-26-19 12:42 AM  Result Value Ref Range   Glucose-Capillary 47 (L) 70 - 99 mg/dL  CBG monitoring, ED     Status: Abnormal   Collection Time: January 26, 2019  1:03 AM  Result Value Ref Range   Glucose-Capillary 187 (H) 70 - 99 mg/dL  I-STAT 7, (LYTES, BLD GAS, ICA, H+H)     Status: Abnormal   Collection Time: 01-26-19  1:19 AM  Result Value Ref Range   pH, Arterial 7.282 (L) 7.350 - 7.450   pCO2 arterial 35.9 32.0 - 48.0 mmHg   pO2, Arterial 44.0 (L) 83.0 - 108.0 mmHg   Bicarbonate 17.1 (L) 20.0 - 28.0 mmol/L   TCO2 18 (L) 22 - 32 mmol/L   O2 Saturation 77.0 %   Acid-base deficit 9.0 (H) 0.0 - 2.0 mmol/L   Sodium 139 135 - 145 mmol/L   Potassium 3.6 3.5 - 5.1 mmol/L   Calcium, Ion 1.16 1.15 - 1.40 mmol/L   HCT 27.0 (L) 36.0 - 46.0 %   Hemoglobin 9.2 (L) 12.0 - 15.0 g/dL   Patient temperature 96.9 F    Collection site RADIAL, ALLEN'S TEST ACCEPTABLE    Sample type ARTERIAL   CBG monitoring, ED     Status: Abnormal   Collection Time: 01-26-19  2:29 AM  Result Value Ref Range   Glucose-Capillary 126 (H) 70 - 99 mg/dL   Comment 1 Notify RN     Chemistries  Recent Labs  Lab 01/23/2019 1838 01/16/2019 2314 2019/01/26 0119  NA 136 139 139  K 3.5 3.6 3.6  CL 106  --   --   CO2 15*  --   --   GLUCOSE 111*  --   --   BUN 55*  --   --   CREATININE 2.94*  --   --   CALCIUM 8.5*  --   --   AST 21  --   --   ALT 16  --   --   ALKPHOS 86  --   --   BILITOT <0.1*  --   --    ------------------------------------------------------------------------------------------------------------------  ------------------------------------------------------------------------------------------------------------------ GFR: CrCl cannot be calculated (Unknown ideal weight.). Liver Function Tests: Recent Labs  Lab 01/12/2019 1838  AST 21  ALT 16  ALKPHOS 86  BILITOT <0.1*  PROT 6.8  ALBUMIN 3.5   No results for input(s): LIPASE, AMYLASE in the last 168 hours. Recent Labs  Lab 01/10/2019 2328   AMMONIA 22   Coagulation Profile: Recent Labs  Lab 12/26/2018 1838  INR 1.3*   Cardiac Enzymes: No results for input(s): CKTOTAL, CKMB, CKMBINDEX, TROPONINI in the last 168 hours. BNP (last 3 results) No results for input(s): PROBNP in the last 8760 hours. HbA1C: No results for input(s): HGBA1C in the last 72 hours. CBG: Recent Labs  Lab 01/09/2019 1830 01/19/2019 2152 2019/01/26 0042 2019/01/26 0103 01-26-19 0229  GLUCAP 121* 169* 47* 187* 126*   Lipid Profile: No results for input(s): CHOL, HDL, LDLCALC, TRIG, CHOLHDL, LDLDIRECT in the last 72 hours. Thyroid Function Tests: No results for input(s): TSH, T4TOTAL, FREET4, T3FREE, THYROIDAB in the last 72 hours. Anemia Panel: No results for input(s): VITAMINB12, FOLATE, FERRITIN, TIBC, IRON, RETICCTPCT in the last 72 hours.  --------------------------------------------------------------------------------------------------------------- Urine analysis:    Component Value Date/Time   COLORURINE YELLOW 01/03/2019 2251   APPEARANCEUR HAZY (A) 01/04/2019 2251   LABSPEC 1.015 01/18/2019 2251   PHURINE 5.0 01/06/2019 2251   GLUCOSEU NEGATIVE 12/29/2018 2251  GLUCOSEU 250 (A) 08/17/2017 1411   HGBUR MODERATE (A) 01/23/2019 2251   BILIRUBINUR NEGATIVE 12/25/2018 2251   KETONESUR NEGATIVE 01/17/2019 2251   PROTEINUR 30 (A) 01/01/2019 2251   UROBILINOGEN 0.2 08/17/2017 1411   NITRITE NEGATIVE 01/19/2019 2251   LEUKOCYTESUR NEGATIVE 01/13/2019 2251      Imaging Results:    Ct Head Wo Contrast  Result Date: 12/29/2018 CLINICAL DATA:  Pt reports 2 days of aphasia, slurred speech and generalized weakness. Pt alert, oriented to self. EXAM: CT HEAD WITHOUT CONTRAST TECHNIQUE: Contiguous axial images were obtained from the base of the skull through the vertex without intravenous contrast. COMPARISON:  04/14/2017 FINDINGS: Brain: No evidence of acute infarction, hemorrhage, hydrocephalus, extra-axial collection or mass lesion/mass effect.  Vascular: No hyperdense vessel or unexpected calcification. Skull: Normal. Negative for fracture or focal lesion. Sinuses/Orbits: Globes and orbits are unremarkable. Sinuses and mastoid air cells are clear. There changes from prior sinus surgery, stable. Other: None. IMPRESSION: Normal CT scan of the brain for age. Electronically Signed   By: Lajean Manes M.D.   On: 01/02/2019 19:13   Dg Chest Port 1 View  Result Date: 12/31/2018 CLINICAL DATA:  Initial evaluation for generalized weakness, aphasia, slurred speech. EXAM: PORTABLE CHEST 1 VIEW COMPARISON:  Prior radiograph from 10/07/2018. FINDINGS: Cardiomegaly, stable. Mediastinal silhouette within normal limits. Aortic atherosclerosis. Lungs mildly hypoinflated. Diffuse vascular congestion with interstitial prominence, suggesting pulmonary interstitial edema. Superimposed patchy opacities within the right mid and lower lung could reflect atelectasis or infiltrates. No definite pleural effusion. No pneumothorax. No acute osseous finding. IMPRESSION: 1. Cardiomegaly with diffuse pulmonary interstitial edema. 2. Superimposed patchy opacities within the right mid and lower lung, which could reflect atelectasis or infiltrates. 3.  Aortic Atherosclerosis (ICD10-I70.0). Electronically Signed   By: Jeannine Boga M.D.   On: 01/08/2019 22:48       Assessment & Plan:    Principal Problem:   Altered mental status Active Problems:   Hypothyroidism   Insulin dependent type 2 diabetes mellitus, controlled (HCC)   Essential hypertension   COPD (chronic obstructive pulmonary disease) (HCC)   CAD (coronary artery disease)  AMS secondary to Norco ? Check MRI brain Check b12, esr, ana, rpr, tsh  HCAP Blood culture x2 Urine strep antigen Urine legionella antigen vanco iv cefepime iv pharmacy to dose Check cbc in am  ARF Hydrate with ns iv Check cmp in am  CAD  Cont Aspirin 73m po  qday Cont Crestor 241mpo qday Cont Toprol XL 7564mo qday  Cont Amlodipine 40m32m qday  PVD Cont Pletal   Aflutter Cont Xarelto  Dm2 fsbs ac and qhs, ISS  Diabetic neuropathy Cont Gabapentin  Hypothyroidism Cont Levothyroxine 137mi39mrams po qday  Gerd Cont PPI   Addendum  Mild Rhabdo / CPK elevation Hydrate with ns iv Check cpk  In am  DVT Prophylaxis-   Xarelto  AM Labs Ordered, also please review Full Orders  Family Communication: Admission, patients condition and plan of care including tests being ordered have been discussed with the patient  who indicate understanding and agree with the plan and Code Status.  Code Status:  FUL CODE per patient  Admission status: Observation: Based on patients clinical presentation and evaluation of above clinical data, I have made determination that patient meets observation criteria at this time.    Time spent in minutes : 70 minutes   JamesJani Gravelon 12/2209-25-20:45 AM

## 2019-01-24 NOTE — ED Notes (Signed)
CBG 195 

## 2019-01-24 NOTE — ED Notes (Signed)
CPR began

## 2019-01-24 NOTE — Consult Note (Signed)
I met with the members of [place] family. They have spent time with the deceased. I engaged them in conversation as to how they were coping. They talked openly and freely about their mom.  We discussed some of the ways we can cope with death. They asked for prayers for they mother and themselves.  I offered caring and supportive presence. Denton Meek. Gretta Cool, Chaplain

## 2019-01-24 NOTE — Plan of Care (Addendum)
I was notified at 07:10 about Pt being coded. Later told Pt passed away. Pt was admitted overnight and assigned to my team from 07:00 today.  I spoke with ER attending, Dr. Leonides Schanz who attended the code and called Pt's time of death. Pt's family has been notified by the ER staffs. Appreciate ER MD and all nursing staffs effort and care.

## 2019-01-24 NOTE — ED Notes (Signed)
Pulse check - No pulse - Compressions resumed

## 2019-01-24 NOTE — ED Notes (Signed)
Pulse check. No pulse resume compressions

## 2019-01-24 NOTE — ED Provider Notes (Addendum)
Called to bedside emergently.  Patient found at 6:56 AM by nursing staff when they were about to transport her to MRI unresponsive.  She had pulled out her IV and removed herself from the cardiac monitor and pulse oximeter.  Last seen by nurse at 6:30am awake and talking.  Found to have no pulse and be apneic.  Put back on the monitor and patient in asystole.  CPR started at 6:58 AM and began bagging patient.  During resuscitation patient received 2 mg of IV Narcan, 4 rounds of IV epinephrine, 2 amps of bicarb.  7.0 endotracheal tube was placed during resuscitation by myself.  Blood glucose was 195.  Patient had no signs of life with fixed and dilated pupils.  In asystoled throughout resuscitation.  Cardiac standstill without pericardial effusion on bedside US.  Decision was made to discontinue further resuscitative measures.  Time of death called at 7:13 AM.  Patient's son, Yetta Barre updated.  I suspect that her cause of death was multifactorial likely secondary to respiratory failure from pulmonary edema, bilateral pneumonia, metabolic acidosis from acute renal failure and complicated by polypharmacy.  On intubation, patient did have pink frothy sputum coming from her endotracheal tube.  She did receive some gentle IV hydration in the ED secondary to her acute renal failure.  IV fluids were ordered per sepsis protocol but I had given verbal order to cancel these IV fluids with patient's nurse Patty prior to her receiving 2250 mL.  7:49 AM  Spoke with Jeanette Caprice, medical examiner.  He will discuss with pathologist in [place] to determine if this will be a medical examiner case.  I have completed death certificate in case this is not an ME case.   Cardiopulmonary Resuscitation (CPR) Procedure Note Directed/Performed by: Pryor Curia I personally directed ancillary staff and/or performed CPR in an effort to regain return of spontaneous circulation and to maintain cardiac, neuro and systemic perfusion.     CRITICAL CARE Performed by: Pryor Curia   Total critical care time: 40 minutes  Critical care time was exclusive of separately billable procedures and treating other patients.  Critical care was necessary to treat or prevent imminent or life-threatening deterioration.  Critical care was time spent personally by me on the following activities: development of treatment plan with patient and/or surrogate as well as nursing, discussions with consultants, evaluation of patient's response to treatment, examination of patient, obtaining history from patient or surrogate, ordering and performing treatments and interventions, ordering and review of laboratory studies, ordering and review of radiographic studies, pulse oximetry and re-evaluation of patient's condition.   Date/Time: Jan 29, 2019 7:00 AM Performed by: Sheralyn Pinegar, Delice Bison, DO Oxygen Delivery Method: Ambu bag Ventilation: Mask ventilation without difficulty Laryngoscope Size: Glidescope and 3 Grade View: Grade I Tube size: 7.0 mm Number of attempts: 1 Placement Confirmation: ETT inserted through vocal cords under direct vision,  CO2 detector and Breath sounds checked- equal and bilateral Secured at: 24 cm Tube secured with: ETT holder          Jevan Gaunt, Delice Bison, DO 01/29/2019 0824    Lynnie Koehler, Delice Bison, DO 01-29-2019 1024

## 2019-01-24 NOTE — Progress Notes (Signed)
ANTICOAGULATION CONSULT NOTE - Initial Consult  Pharmacy Consult for Xarelto Indication: atrial flutter  No Known Allergies  Patient Measurements:   Vital Signs: Temp: 96.9 F (36.1 C) (11/21 2249) Temp Source: Rectal (11/21 2249) BP: 112/42 (11/22 0000) Pulse Rate: 76 (11/22 0045)  Labs: Recent Labs    12/28/2018 1838 01/17/2019 2314 01-26-19 0119  HGB 9.1* 9.9* 9.2*  HCT 29.3* 29.0* 27.0*  PLT 317  --   --   APTT 31  --   --   LABPROT 16.0*  --   --   INR 1.3*  --   --   CREATININE 2.94*  --   --     CrCl cannot be calculated (Unknown ideal weight.).   Medical History: Past Medical History:  Diagnosis Date  . Anemia   . Anxiety   . Arthritis   . CAD (coronary artery disease) 2016   non-obstructive at cath  . Colon polyps 09/11/2010   Tubular adenoma  . COPD 12/14/2008   PATIENT DENIES    . Depression   . Diabetic coma ([place]) Feb. 2014  . Fall at home 2016   x 2 in January 2016, Left knee  . Fibromyalgia   . GERD 07/20/2006  . Headache(784.0)   . History of transient ischemic attack (TIA)   . HPV (human papilloma virus) infection   . Hyperlipidemia   . Hypertension   . Hypothyroidism   . 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 Lumbar disc disease   . Neuromuscular disorder ([place])    PERIPHERAL NEUROPATHY  . Peripheral vascular disease ([place])   . Personal history of malignant neoplasm of kidney(V10.52) 12/14/2008   Laparoscopic biopsy and cryoablation 7/08 Dr. Vernie Shanks   . Renal disorder    chronic kidney disease   . Stroke [facility])    3 MINI STROKES  RT SIDED WEAKNESS  . Vertigo     Medications:  No current facility-administered medications on file prior to encounter.    Current Outpatient Medications on File Prior to Encounter  Medication Sig Dispense Refill  . ACCU-CHEK FASTCLIX LANCETS MISC Use 4 per day to check blood sugar dx code E11.9 102 each 3  . amLODipine (NORVASC) 10 MG tablet Take 10 mg by mouth daily.     Marland Kitchen aspirin EC 81 MG tablet Take 81 mg by mouth daily.    . Blood Glucose Monitoring Suppl (BAYER CONTOUR LINK 2.4) w/Device KIT by Does not apply route.    . cilostazol (PLETAL) 100 MG tablet TAKE 1 TABLET BY MOUTH TWICE A DAY (Patient taking differently: Take 100 mg by mouth 2 (two) times daily. ) 180 tablet 2  . DULoxetine (CYMBALTA) 60 MG capsule Take 1 capsule (60 mg total) by mouth daily. (Patient taking differently: Take 60 mg by mouth at bedtime. ) 30 capsule 3  . ferrous sulfate 325 (65 FE) MG tablet Take 325 mg by mouth 2 (two) times daily.     . furosemide (LASIX) 20 MG tablet Take 1 tablet (20 mg total) by mouth every other day. 90 tablet 3  . gabapentin (NEURONTIN) 300 MG capsule TAKE 1 CAPSULE BY MOUTH TWICE DAILY 60 capsule 2  . glucose blood (CONTOUR NEXT TEST) test strip Use to test blood sugar 3 times daily E11.9 100 each 8  . HYDROcodone-acetaminophen (NORCO) 10-325 MG tablet Take 1 tablet by mouth 3 (three) times daily as needed (for pain). Follow up with PCP if refills needed (Patient taking differently: Take 1  tablet by mouth 4 (four) times daily as needed (for pain). )    . insulin lispro (HUMALOG) 100 UNIT/ML injection Inject 0.7 mLs (70 Units total) into the skin See admin instructions. Use 200 units every 3 days in insulin pump (Patient taking differently: Inject into the skin See admin instructions. Use 200 units every 3 days in insulin pump) 30 mL 3  . Insulin Pen Needle (BD PEN NEEDLE NANO U/F) 32G X 4 MM MISC Use to inject insulin 100 each 3  . lansoprazole (PREVACID) 30 MG capsule Take 1 capsule (30 mg total) by mouth daily.    Marland Kitchen levothyroxine (SYNTHROID) 137 MCG tablet Take 1 tablet (137 mcg total) by mouth daily before breakfast. (Patient taking differently: Take 125 mcg by mouth daily before breakfast. ) 30 tablet 1  . metoprolol succinate (TOPROL-XL) 50 MG 24 hr tablet Take 1.5 tablets (75 mg total) once a day (Patient taking differently: Take 75 mg by mouth daily.  Take 1.5 tablets (75 mg total) once a day) 180 tablet 3  . ondansetron (ZOFRAN) 4 MG tablet Take 4 mg by mouth every 8 (eight) hours as needed for nausea.     . potassium chloride (K-DUR) 10 MEQ tablet Take 1 tablet (10 meq) twice a day, every other day (Patient taking differently: Take 10 mEq by mouth daily. ) 90 tablet 3  . Rivaroxaban (XARELTO) 15 MG TABS tablet Take 1 tablet (15 mg total) by mouth daily with supper. 30 tablet 0  . rosuvastatin (CRESTOR) 20 MG tablet Take 20 mg by mouth daily.    Marland Kitchen tolterodine (DETROL LA) 4 MG 24 hr capsule Take 4 mg by mouth daily.     Marland Kitchen triamcinolone cream (KENALOG) 0.1 % Apply 1 application topically 2 (two) times daily. 30 g 0  . Vitamin D, Ergocalciferol, (DRISDOL) 1.25 MG (50000 UT) CAPS capsule Take 50,000 Units by mouth every 7 (seven) days. Monday       Assessment: 73 y.o. female admitted with AMS/PNA, h/o Aflutter, to continue Xarelto   Plan:  Xarelto 15 mg daily F/U renal fxn  April Branch, Bronson Curb January 16, 2019,3:15 AM

## 2019-01-24 NOTE — ED Notes (Signed)
Pulse check. No pulse. Intubation performed. Compressions resumed

## 2019-01-24 NOTE — ED Notes (Signed)
Time of death 3. Called by Dr. Leonides Schanz

## 2019-01-24 NOTE — ED Notes (Signed)
Two rings taken off pts Lt ring finger and given to Rosario Adie by Burman Nieves, RN

## 2019-01-24 NOTE — ED Notes (Addendum)
Transport came to take pt to MRI and needed this RN's assistance to transfer pt to stretcher oxygen, remove fluids, and removed the purewick catheter. Upon entering the room the pt was slouched down in the bed again with her legs through the rails, IV disconnected from the hub, all equipment removed, and with oxygen still on. Attempted to stimulate the pt with a sternal rub and there was no response from the pt. Checked a pulse and placed pt back on cardiac monitor. Upon pulse check, there was no pulse. Immediately pulled the code lever and started CPR.

## 2019-01-24 NOTE — ED Notes (Addendum)
Rosario Adie son      [phone] Family viewed body and I explained the process regarding funeral home selection and notifying Bed Placement.   Son requesting to speak with attending-

## 2019-01-24 NOTE — Progress Notes (Signed)
Pharmacy Antibiotic Note  April Branch is a 73 y.o. female admitted on 01/02/2019 with AMS and HCAP.  Pharmacy has been consulted for Vancomycin and Cefepime dosing.  Plan: Vancomycin 1250 mg IV now, then 750 mg IV q48h  Est AUC 439 Cefepime 2 g IV q24h      Temp (24hrs), Avg:97.6 F (36.4 C), Min:96.9 F (36.1 C), Max:98.3 F (36.8 C)  Recent Labs  Lab 01/04/2019 1838 01/06/2019 2226  WBC 15.4*  --   CREATININE 2.94*  --   LATICACIDVEN  --  0.9    CrCl cannot be calculated (Unknown ideal weight.).    No Known Allergies   April Branch 2019-02-08 3:08 AM

## 2019-01-24 DEATH — deceased

## 2019-03-06 IMAGING — CT CT HEAD W/O CM
4 series · 16 of 47 positions shown, 18 images · non-contrast
Comparison: 04/22/2017

CLINICAL DATA: Possible stroke. Ataxia with left-sided arm and leg
weakness.

EXAM:
CT HEAD WITHOUT CONTRAST
TECHNIQUE: Contiguous axial images were obtained from the base of the skull
through the vertex without intravenous contrast.

[Series 3: head without · axial · non-contrast · 0.39mm/px · z∈[-160,-56]mm · 7 of 29 slices shown, 9 images]
[im 4/29  brain]
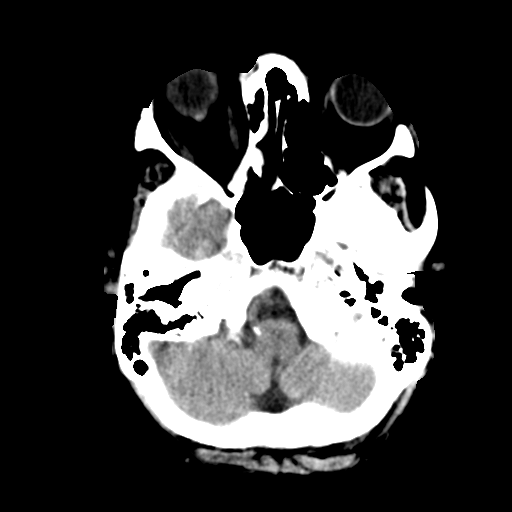
[im 4/29  bone]
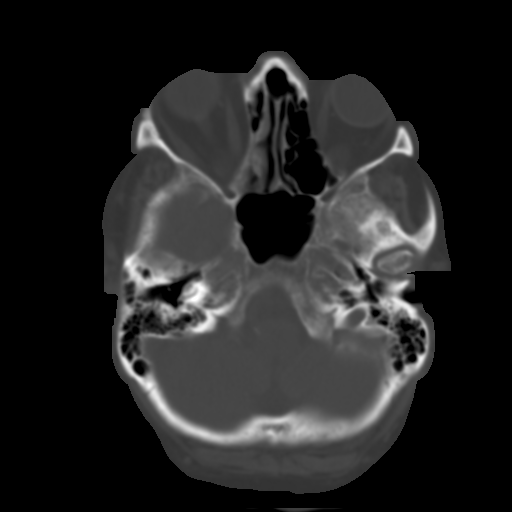
[im 8/29  brain]
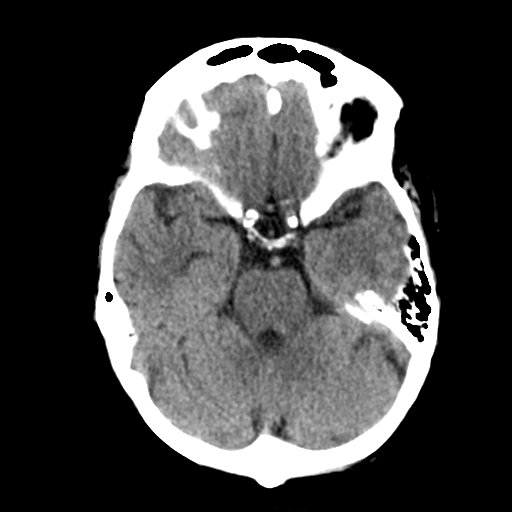
[im 11/29  brain]
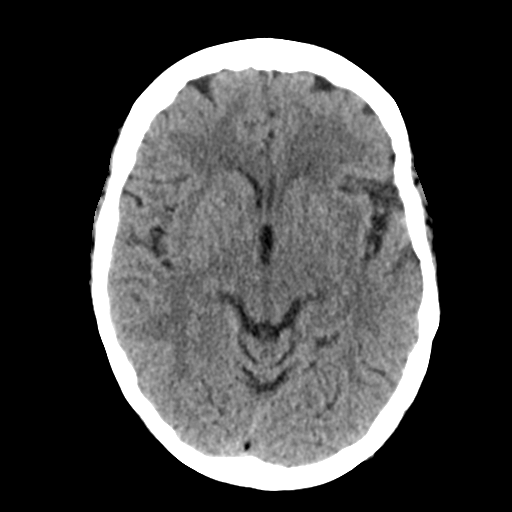
[im 15/29  brain]
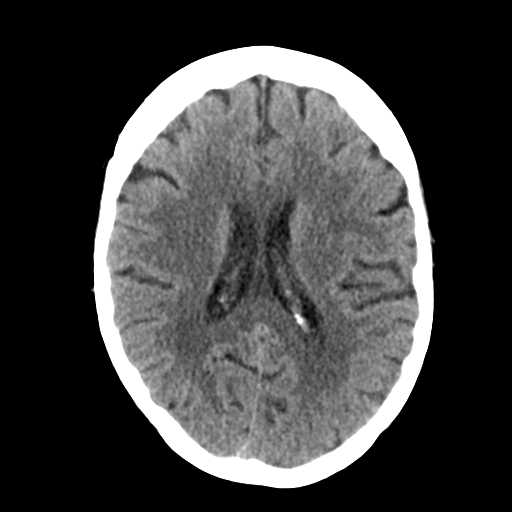
[im 18/29  brain]
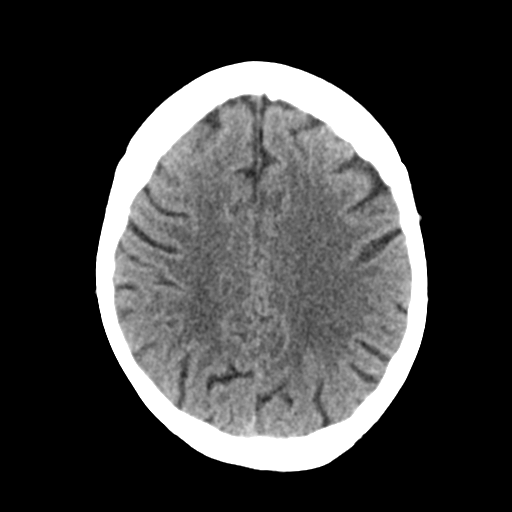
[im 18/29  bone]
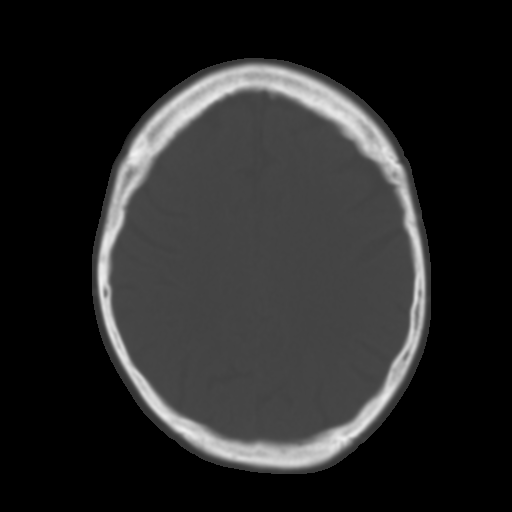
[im 22/29  brain]
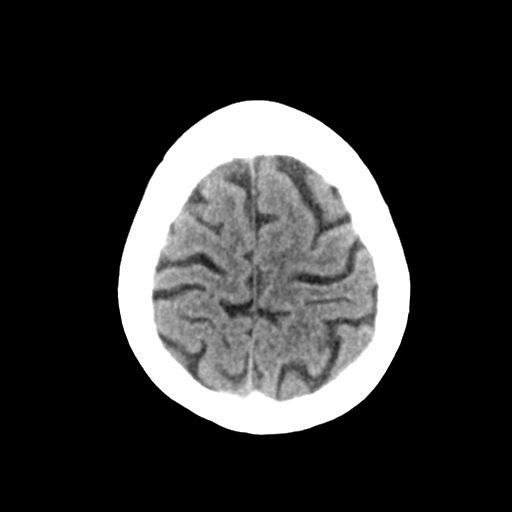
[im 25/29  brain]
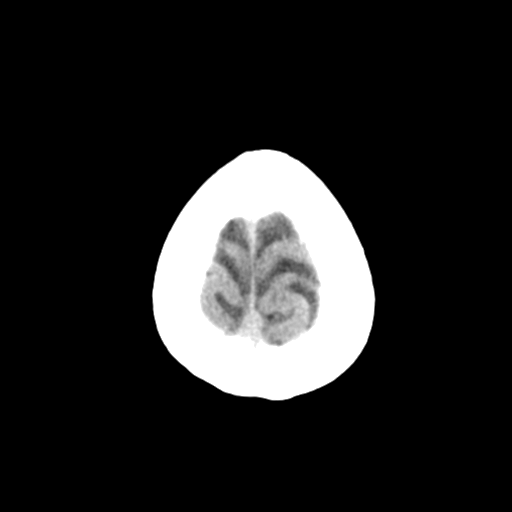

[Series 4: head bone · axial · 0.39mm/px · z∈[-162,-134]mm · 3 of 72 slices shown]
[im 8/72  bone]
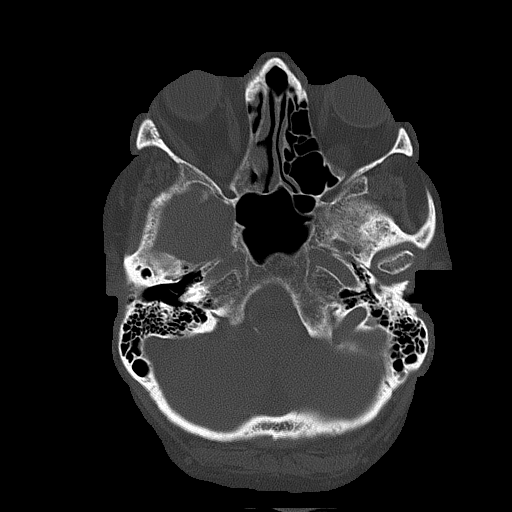
[im 15/72  bone]
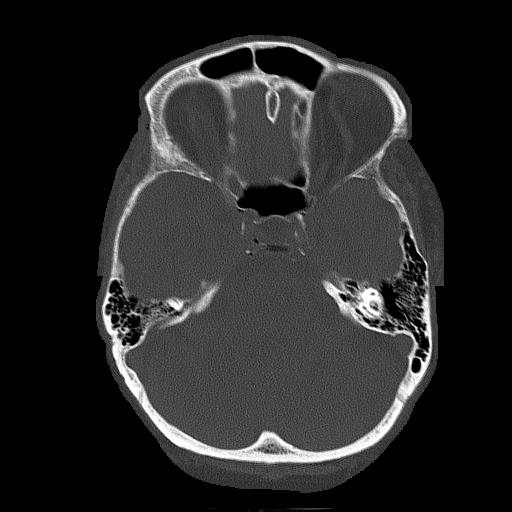
[im 22/72  bone]
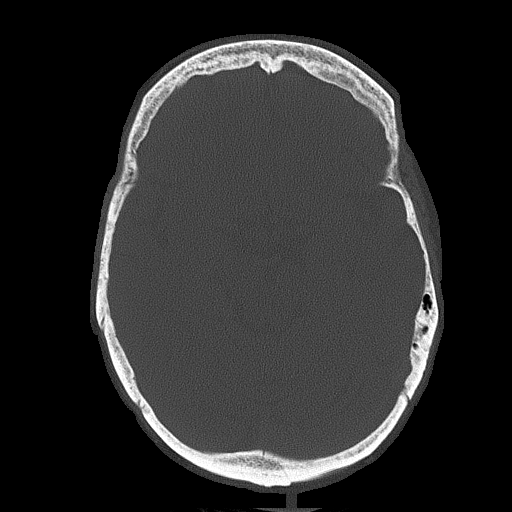

[Series 5: head without cor · coronal · non-contrast · 0.30mm/px · 3 of 67 slices shown]
[im 23/67  brain]
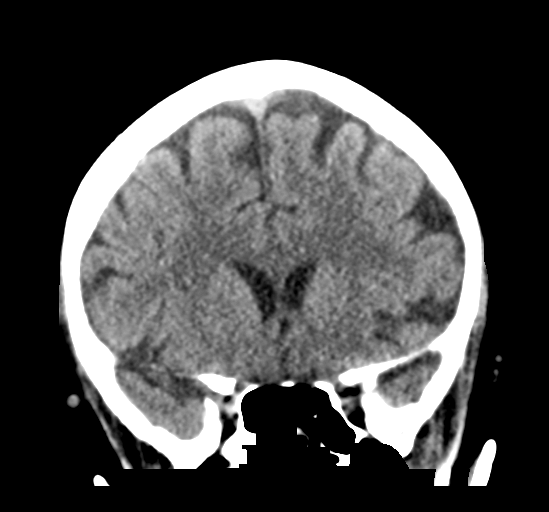
[im 30/67  brain]
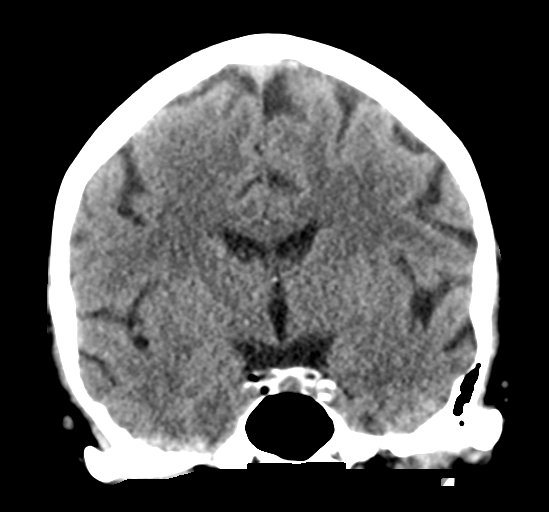
[im 37/67  brain]
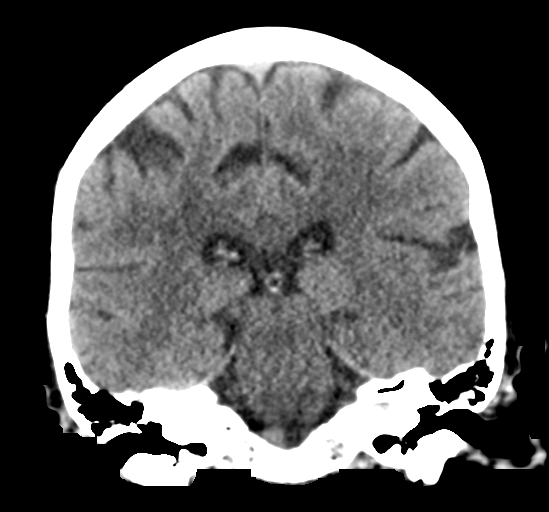

[Series 6: head without sag · sagittal · non-contrast · 0.28mm/px · 3 of 53 slices shown]
[im 18/53  brain]
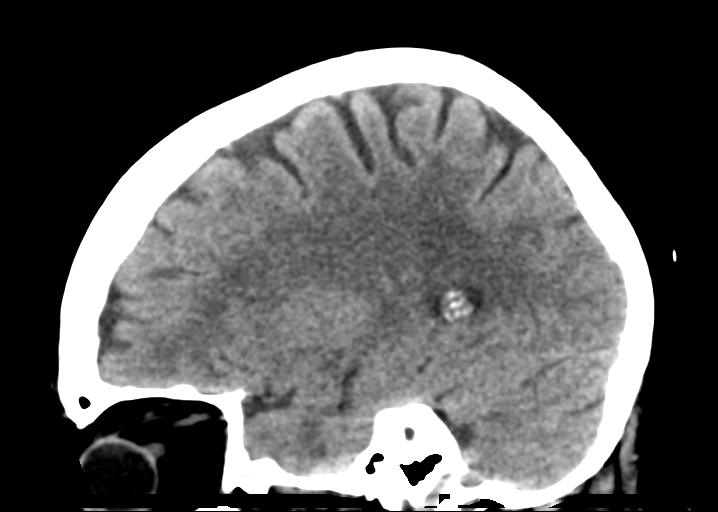
[im 27/53  brain]
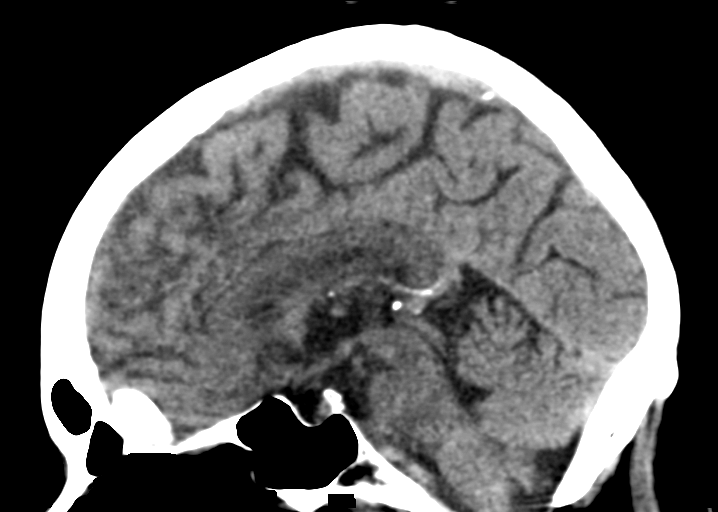
[im 35/53  brain]
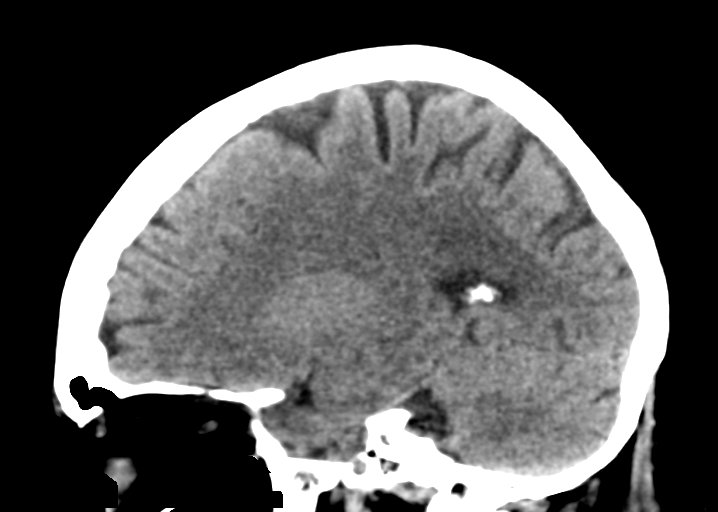

[16 of 47 positions shown; findings below may reference images not displayed]

FINDINGS: Brain: No evidence of acute infarction, hemorrhage, hydrocephalus,
extra-axial collection or mass lesion/mass effect. Mild chronic
small vessel ischemic type change in the cerebral white matter.

Vascular: Atherosclerotic calcification.  No hyperdense vessel

Skull: No acute finding

Sinuses/Orbits: Endoscopic sinus surgery on the right. There is
volume loss at the right ethmoids with relatively expanded right
orbit. Bilateral cataract resection.
IMPRESSION: No acute finding.
